# Patient Record
Sex: Female | Born: 1935
Health system: Southern US, Community
[De-identification: ages and names within clinical notes are randomized; demographics above are authoritative.]

## PROBLEM LIST (undated history)

## (undated) DIAGNOSIS — E7800 Pure hypercholesterolemia, unspecified: Secondary | ICD-10-CM

## (undated) DIAGNOSIS — J45909 Unspecified asthma, uncomplicated: Secondary | ICD-10-CM

## (undated) DIAGNOSIS — R002 Palpitations: Secondary | ICD-10-CM

## (undated) DIAGNOSIS — I10 Essential (primary) hypertension: Secondary | ICD-10-CM

## (undated) DIAGNOSIS — M353 Polymyalgia rheumatica: Secondary | ICD-10-CM

## (undated) DIAGNOSIS — R6520 Severe sepsis without septic shock: Secondary | ICD-10-CM

## (undated) DIAGNOSIS — A419 Sepsis, unspecified organism: Secondary | ICD-10-CM

## (undated) DIAGNOSIS — I5030 Unspecified diastolic (congestive) heart failure: Secondary | ICD-10-CM

## (undated) DIAGNOSIS — M8580 Other specified disorders of bone density and structure, unspecified site: Secondary | ICD-10-CM

## (undated) DIAGNOSIS — I509 Heart failure, unspecified: Secondary | ICD-10-CM

## (undated) DIAGNOSIS — I351 Nonrheumatic aortic (valve) insufficiency: Secondary | ICD-10-CM

## (undated) DIAGNOSIS — I219 Acute myocardial infarction, unspecified: Secondary | ICD-10-CM

## (undated) DIAGNOSIS — E119 Type 2 diabetes mellitus without complications: Secondary | ICD-10-CM

## (undated) DIAGNOSIS — I2510 Atherosclerotic heart disease of native coronary artery without angina pectoris: Secondary | ICD-10-CM

## (undated) HISTORY — DX: Heart failure, unspecified: I50.9

## (undated) HISTORY — DX: Atherosclerotic heart disease of native coronary artery without angina pectoris: I25.10

## (undated) HISTORY — DX: Essential (primary) hypertension: I10

## (undated) HISTORY — DX: Pure hypercholesterolemia, unspecified: E78.00

## (undated) HISTORY — PX: BREAST EXCISIONAL BIOPSY: SUR124

## (undated) HISTORY — PX: CATARACT EXTRACTION: SUR2

## (undated) HISTORY — DX: Other specified disorders of bone density and structure, unspecified site: M85.80

## (undated) HISTORY — DX: Unspecified asthma, uncomplicated: J45.909

## (undated) HISTORY — DX: Type 2 diabetes mellitus without complications: E11.9

## (undated) HISTORY — DX: Nonrheumatic aortic (valve) insufficiency: I35.1

## (undated) HISTORY — DX: Unspecified diastolic (congestive) heart failure: I50.30

---

## 1979-11-01 HISTORY — PX: ABDOMINAL HYSTERECTOMY: SHX81

## 1993-04-30 HISTORY — PX: UMBILICAL HERNIA REPAIR: SHX196

## 2005-06-22 ENCOUNTER — Ambulatory Visit: Payer: Self-pay | Admitting: Internal Medicine

## 2006-07-18 ENCOUNTER — Ambulatory Visit: Payer: Self-pay | Admitting: Internal Medicine

## 2007-05-16 ENCOUNTER — Ambulatory Visit: Payer: Self-pay | Admitting: Internal Medicine

## 2007-07-24 ENCOUNTER — Ambulatory Visit: Payer: Self-pay | Admitting: Internal Medicine

## 2008-07-25 ENCOUNTER — Ambulatory Visit: Payer: Self-pay | Admitting: Internal Medicine

## 2008-12-04 ENCOUNTER — Ambulatory Visit: Payer: Self-pay | Admitting: Gastroenterology

## 2008-12-05 ENCOUNTER — Ambulatory Visit: Payer: Self-pay | Admitting: Gastroenterology

## 2008-12-22 ENCOUNTER — Ambulatory Visit: Payer: Self-pay | Admitting: Gastroenterology

## 2009-03-19 ENCOUNTER — Ambulatory Visit: Payer: Self-pay | Admitting: Cardiology

## 2009-03-19 ENCOUNTER — Ambulatory Visit: Payer: Self-pay | Admitting: General Surgery

## 2009-03-26 ENCOUNTER — Ambulatory Visit: Payer: Self-pay | Admitting: General Surgery

## 2009-08-14 ENCOUNTER — Ambulatory Visit: Payer: Self-pay | Admitting: Internal Medicine

## 2010-02-11 ENCOUNTER — Ambulatory Visit: Payer: Self-pay | Admitting: Unknown Physician Specialty

## 2010-08-16 ENCOUNTER — Ambulatory Visit: Payer: Self-pay | Admitting: Internal Medicine

## 2011-09-26 ENCOUNTER — Ambulatory Visit: Payer: Self-pay | Admitting: Internal Medicine

## 2012-09-14 ENCOUNTER — Ambulatory Visit (INDEPENDENT_AMBULATORY_CARE_PROVIDER_SITE_OTHER): Payer: 59 | Admitting: Internal Medicine

## 2012-09-14 ENCOUNTER — Encounter: Payer: Self-pay | Admitting: Internal Medicine

## 2012-09-14 VITALS — BP 145/84 | HR 86 | Temp 97.7°F | Ht 60.5 in | Wt 139.5 lb

## 2012-09-14 DIAGNOSIS — E119 Type 2 diabetes mellitus without complications: Secondary | ICD-10-CM | POA: Insufficient documentation

## 2012-09-14 DIAGNOSIS — E7800 Pure hypercholesterolemia, unspecified: Secondary | ICD-10-CM | POA: Insufficient documentation

## 2012-09-14 DIAGNOSIS — J45909 Unspecified asthma, uncomplicated: Secondary | ICD-10-CM

## 2012-09-14 DIAGNOSIS — R5383 Other fatigue: Secondary | ICD-10-CM

## 2012-09-14 DIAGNOSIS — Z139 Encounter for screening, unspecified: Secondary | ICD-10-CM

## 2012-09-14 DIAGNOSIS — I10 Essential (primary) hypertension: Secondary | ICD-10-CM

## 2012-09-14 DIAGNOSIS — E1159 Type 2 diabetes mellitus with other circulatory complications: Secondary | ICD-10-CM | POA: Insufficient documentation

## 2012-09-14 NOTE — Patient Instructions (Addendum)
It was nice seeing you today.  I am glad you have been doing well.  Let me know if you have any problems.  

## 2012-09-15 ENCOUNTER — Encounter: Payer: Self-pay | Admitting: Internal Medicine

## 2012-09-15 DIAGNOSIS — J45909 Unspecified asthma, uncomplicated: Secondary | ICD-10-CM | POA: Insufficient documentation

## 2012-09-15 NOTE — Progress Notes (Signed)
  Subjective:    [name], female    DOB: August 15, 1933, 78 y.o.   MRN: [id number]  HPI 78 year old female with past history of hypertension, diabetes, hypercholesterolemia and reactive airways disease who comes in today to follow up on these issues as well as for a complete physical exam.  She states she is doing well.  Breathing stable.  Sees Dr Meredeth Ide.  No change in her PFTs last visit.  Watching her diet.  Sugars averaging 115-120.  Overall she feels she is doing well.   Past Medical History  Diagnosis Date  . Reactive airway disease   . Hypertension   . Hypercholesterolemia   . Osteopenia   . Diabetes mellitus     Review of Systems Patient denies any headache, lightheadedness or dizziness. No significant sinus or allergy symptoms.   No chest pain, tightness or palpitations.  No increased shortness of breath, cough or congestion.  No nausea or vomiting.  No abdominal pain or cramping.  No bowel change, such as diarrhea, constipation, BRBPR or melana.  No urine change.        Objective:   Physical Exam Filed Vitals:   09/14/12 1028  BP: 145/84  Pulse: 86  Temp: 97.7 F (36.5 C)   Blood pressure recheck:  57/35  78 year old female in no acute distress.   HEENT:  Nares- clear.  Oropharynx - without lesions. NECK:  Supple.  Nontender.  No audible bruit.  HEART:  Appears to be regular. LUNGS:  No crackles or wheezing audible.  Respirations even and unlabored.  RADIAL PULSE:  Equal bilaterally.    BREASTS:  No nipple discharge or nipple retraction present.  Could not appreciate any distinct nodules or axillary adenopathy.  ABDOMEN:  Soft, nontender.  Bowel sounds present and normal.  No audible abdominal bruit.  GU:  Normal external genitalia.  Vaginal vault without lesions.  S/P hysterectomy.  Could not appreciate any adnexal masses or tenderness.   RECTAL:  Heme negative.   EXTREMITIES:  No increased edema present.  DP pulses palpable and equal bilaterally.            Assessment & Plan:  HEALTH MAINTENANCE.  Physical today.  Is s/p hysterectomy and does not require yearly pap smears.  Colonoscopy 2/10 revealed diverticulosis with no polyps.  Mammogram 09/26/11 - BiRADS II.  Schedule a follow up mammogram.  Bone density normal - 4/09.  Will notify me when agreeable for a follow up bone density.

## 2012-09-15 NOTE — Assessment & Plan Note (Signed)
Breathing stable.  Sees Dr Fleming.  Follow.   

## 2012-09-15 NOTE — Assessment & Plan Note (Signed)
Blood pressure as outlined.  Same meds.  Follow.  Check metabolic panel.    

## 2012-09-15 NOTE — Assessment & Plan Note (Signed)
Sugars as outlined.  Watching her diet.  Check met b and a1c with next labs.

## 2012-09-15 NOTE — Assessment & Plan Note (Signed)
Low cholesterol diet and exercise.  Remains on Simvastatin.  Check lipid panel and liver function.

## 2012-09-17 ENCOUNTER — Other Ambulatory Visit (INDEPENDENT_AMBULATORY_CARE_PROVIDER_SITE_OTHER): Payer: 59

## 2012-09-17 DIAGNOSIS — I10 Essential (primary) hypertension: Secondary | ICD-10-CM

## 2012-09-17 DIAGNOSIS — E7800 Pure hypercholesterolemia, unspecified: Secondary | ICD-10-CM

## 2012-09-17 DIAGNOSIS — R5381 Other malaise: Secondary | ICD-10-CM

## 2012-09-17 DIAGNOSIS — R5383 Other fatigue: Secondary | ICD-10-CM

## 2012-09-17 DIAGNOSIS — E119 Type 2 diabetes mellitus without complications: Secondary | ICD-10-CM

## 2012-09-17 LAB — LIPID PANEL
HDL: 43.9 mg/dL (ref >39.00)
Total CHOL/HDL Ratio: 4
VLDL: 30.2 mg/dL (ref 0.0–40.0)

## 2012-09-17 LAB — CBC WITH DIFFERENTIAL/PLATELET
Basophils Absolute: 0.1 10*3/uL (ref 0.0–0.1)
Eosinophils Absolute: 0.5 10*3/uL (ref 0.0–0.7)
HCT: 39.2 % (ref 36.0–46.0)
Hemoglobin: 12.9 g/dL (ref 12.0–15.0)
Lymphs Abs: 2.2 10*3/uL (ref 0.7–4.0)
MCHC: 32.8 g/dL (ref 30.0–36.0)
Neutro Abs: 5.2 10*3/uL (ref 1.4–7.7)
RDW: 13.5 % (ref 11.5–14.6)

## 2012-09-17 LAB — BASIC METABOLIC PANEL
Calcium: 9.8 mg/dL (ref 8.4–10.5)
GFR: 70.8 mL/min (ref >60.00)
Sodium: 140 mEq/L (ref 135–145)

## 2012-09-17 LAB — HEPATIC FUNCTION PANEL
Albumin: 4.3 g/dL (ref 3.5–5.2)
Alkaline Phosphatase: 58 U/L (ref 39–117)
Bilirubin, Direct: 0.1 mg/dL (ref 0.0–0.3)

## 2012-09-17 LAB — HEMOGLOBIN A1C: Hgb A1c MFr Bld: 6.4 % (ref 4.6–6.5)

## 2012-09-21 ENCOUNTER — Other Ambulatory Visit: Payer: 59

## 2012-09-26 ENCOUNTER — Telehealth: Payer: Self-pay | Admitting: Internal Medicine

## 2012-09-26 MED ORDER — SIMVASTATIN 10 MG PO TABS: 10.0000 mg | ORAL_TABLET | Freq: Every day | ORAL | Status: DC

## 2012-09-26 NOTE — Telephone Encounter (Signed)
Refill request for simvastatin 10 mg tab Qty: 30 Sig: take one tablet by mouth every day Patient has been seen

## 2012-09-26 NOTE — Telephone Encounter (Signed)
Refilled script for Zocor

## 2012-10-31 ENCOUNTER — Telehealth: Payer: Self-pay | Admitting: Internal Medicine

## 2012-10-31 NOTE — Telephone Encounter (Signed)
Called in flonase refill (one month with 5 refills).

## 2012-11-02 ENCOUNTER — Ambulatory Visit: Payer: Self-pay | Admitting: Internal Medicine

## 2012-11-06 ENCOUNTER — Ambulatory Visit: Payer: Self-pay | Admitting: Internal Medicine

## 2012-11-12 ENCOUNTER — Other Ambulatory Visit: Payer: Self-pay | Admitting: Internal Medicine

## 2012-11-12 MED ORDER — FLUTICASONE-SALMETEROL 250-50 MCG/DOSE IN AEPB: 1.0000 | INHALATION_SPRAY | Freq: Two times a day (BID) | RESPIRATORY_TRACT | Status: DC

## 2012-11-12 NOTE — Telephone Encounter (Signed)
Sent in to pharmacy.  

## 2012-11-12 NOTE — Telephone Encounter (Signed)
Refill on Advair 250/50 -[address]

## 2012-11-19 ENCOUNTER — Encounter: Payer: Self-pay | Admitting: Internal Medicine

## 2012-11-22 ENCOUNTER — Telehealth: Payer: Self-pay | Admitting: Internal Medicine

## 2012-11-22 NOTE — Telephone Encounter (Signed)
metFORMIN (GLUCOPHAGE) 500 MG tablet  #60

## 2012-11-23 ENCOUNTER — Other Ambulatory Visit: Payer: Self-pay | Admitting: *Deleted

## 2012-11-23 MED ORDER — METFORMIN HCL 500 MG PO TABS: 500.0000 mg | ORAL_TABLET | Freq: Two times a day (BID) | ORAL | Status: DC

## 2012-11-23 NOTE — Telephone Encounter (Signed)
Patient only has 3 pills left.

## 2012-11-23 NOTE — Telephone Encounter (Signed)
Already been filled

## 2012-11-23 NOTE — Addendum Note (Signed)
Addended by: Charm Barges on: 11/23/2012 05:21 PM   Modules accepted: Orders

## 2012-12-25 ENCOUNTER — Telehealth: Payer: Self-pay | Admitting: Internal Medicine

## 2012-12-25 MED ORDER — LISINOPRIL-HYDROCHLOROTHIAZIDE 20-12.5 MG PO TABS: 1.0000 | ORAL_TABLET | Freq: Every day | ORAL | Status: DC

## 2012-12-25 NOTE — Telephone Encounter (Signed)
lisinopril-hydrochlorothiazide (PRINZIDE,ZESTORETIC) 20-12.5 MG per tablet   #90

## 2013-01-11 ENCOUNTER — Encounter: Payer: Self-pay | Admitting: Internal Medicine

## 2013-01-11 ENCOUNTER — Ambulatory Visit (INDEPENDENT_AMBULATORY_CARE_PROVIDER_SITE_OTHER): Payer: Medicare PPO | Admitting: Internal Medicine

## 2013-01-11 VITALS — BP 120/80 | HR 80 | Temp 98.5°F | Ht 60.5 in | Wt 139.5 lb

## 2013-01-11 DIAGNOSIS — J45909 Unspecified asthma, uncomplicated: Secondary | ICD-10-CM

## 2013-01-11 LAB — HEMOGLOBIN A1C: Hgb A1c MFr Bld: 6.5 % (ref 4.6–6.5)

## 2013-01-11 LAB — BASIC METABOLIC PANEL
CO2: 25 mEq/L (ref 19–32)
Calcium: 9.7 mg/dL (ref 8.4–10.5)
Chloride: 103 mEq/L (ref 96–112)
Creatinine, Ser: 0.9 mg/dL (ref 0.4–1.2)
Glucose, Bld: 147 mg/dL — ABNORMAL HIGH (ref 70–99)

## 2013-01-11 LAB — HEPATIC FUNCTION PANEL
ALT: 17 U/L (ref 0–35)
AST: 21 U/L (ref 0–37)
Bilirubin, Direct: 0.1 mg/dL (ref 0.0–0.3)
Total Bilirubin: 0.8 mg/dL (ref 0.3–1.2)

## 2013-01-11 LAB — LIPID PANEL
Total CHOL/HDL Ratio: 4
Triglycerides: 251 mg/dL — ABNORMAL HIGH (ref 0.0–149.0)

## 2013-01-11 LAB — LDL CHOLESTEROL, DIRECT: Direct LDL: 92.5 mg/dL

## 2013-01-13 ENCOUNTER — Encounter: Payer: Self-pay | Admitting: Internal Medicine

## 2013-01-13 NOTE — Assessment & Plan Note (Signed)
Sugars she reports are doing well.   Watching her diet.  Check met b and a1c with next labs.  Last a1c 09/17/12 - 6.4.

## 2013-01-13 NOTE — Assessment & Plan Note (Signed)
Blood pressure as outlined.  Same meds.  Follow.  Check metabolic panel.

## 2013-01-13 NOTE — Assessment & Plan Note (Signed)
Low cholesterol diet and exercise.  Remains on Simvastatin.  Check lipid panel and liver function.  Last lipid panel 09/17/12 revealed total cholesterol 165, triglycerides 151, HDL 44 and LDL 91.

## 2013-01-13 NOTE — Assessment & Plan Note (Signed)
Breathing stable.  Sees Dr Fleming.  Follow.   

## 2013-01-13 NOTE — Progress Notes (Signed)
  Subjective:    [name], female    DOB: 02-03-1933, 79 y.o.   MRN: [id number]  HPI 79 year old female with past history of hypertension, diabetes, hypercholesterolemia and reactive airways disease who comes in today for a scheduled follow up.  She states she is doing well.  Breathing stable.  Sees Dr Meredeth Ide.  No changes made.  She has noticed some burning in her nose.  Discussed using saline and her Flonase.  Watching her diet.  Sugars doing well per her report.  Brought in no recorded sugar readings.  Overall she feels she is doing well.   Past Medical History  Diagnosis Date  . Reactive airway disease   . Hypertension   . Hypercholesterolemia   . Osteopenia   . Diabetes mellitus      Current Outpatient Prescriptions on File Prior to Visit  Medication Sig Dispense Refill  . aspirin 81 MG tablet Take 81 mg by mouth daily.      . calcium carbonate (OS-CAL) 600 MG TABS Take 600 mg by mouth daily.      . fluticasone (FLONASE) 50 MCG/ACT nasal spray Place 2 sprays into the nose daily.      . Fluticasone-Salmeterol (ADVAIR) 250-50 MCG/DOSE AEPB Inhale 1 puff into the lungs every 12 (twelve) hours.  60 each  5  . lisinopril-hydrochlorothiazide (PRINZIDE,ZESTORETIC) 20-12.5 MG per tablet Take 1 tablet by mouth daily.  90 tablet  1  . metFORMIN (GLUCOPHAGE) 500 MG tablet Take 1 tablet (500 mg total) by mouth 2 (two) times daily with a meal.  60 tablet  5  . Multiple Vitamins-Minerals (VISION FORMULA/LUTEIN PO) Take by mouth daily.      . simvastatin (ZOCOR) 10 MG tablet Take 1 tablet (10 mg total) by mouth at bedtime.  90 tablet  3   No current facility-administered medications on file prior to visit.    Review of Systems Patient denies any headache, lightheadedness or dizziness. No significant sinus or allergy symptoms.  Some burning in her nose.  See above.   No chest pain, tightness or palpitations.  No increased shortness of breath, cough or congestion.  No nausea or  vomiting. No acid reflux.   No abdominal pain or cramping.  No bowel change, such as diarrhea, constipation, BRBPR or melana.  No urine change.        Objective:   Physical Exam  Filed Vitals:   01/11/13 1018  BP: 120/80  Pulse: 80  Temp: 98.5 F (36.9 C)   Blood pressure recheck:  45/58  79 year old female in no acute distress.   HEENT:  Nares- clear.  Oropharynx - without lesions. NECK:  Supple.  Nontender.  No audible bruit.  HEART:  Appears to be regular. LUNGS:  No crackles or wheezing audible.  Respirations even and unlabored.  RADIAL PULSE:  Equal bilaterally. ABDOMEN:  Soft, nontender.  Bowel sounds present and normal.  No audible abdominal bruit.   EXTREMITIES:  No increased edema present.  DP pulses palpable and equal bilaterally.           Assessment & Plan:  HEALTH MAINTENANCE.  Physical 09/14/12.  Is s/p hysterectomy and does not require yearly pap smears.  Colonoscopy 2/10 revealed diverticulosis with no polyps.  Mammogram 09/26/11 - BiRADS II.  States she had a follow up mammogram.  Obtain results.   Bone density normal - 4/09.  Will notify me when agreeable for a follow up bone density.

## 2013-02-17 ENCOUNTER — Emergency Department: Payer: Self-pay | Admitting: Emergency Medicine

## 2013-02-17 LAB — COMPREHENSIVE METABOLIC PANEL
Albumin: 4.1 g/dL (ref 3.4–5.0)
Alkaline Phosphatase: 84 U/L (ref 50–136)
Anion Gap: 8 (ref 7–16)
BUN: 25 mg/dL — ABNORMAL HIGH (ref 7–18)
Bilirubin,Total: 0.5 mg/dL (ref 0.2–1.0)
Calcium, Total: 9.8 mg/dL (ref 8.5–10.1)
EGFR (Non-African Amer.): 49 — ABNORMAL LOW
Potassium: 4 mmol/L (ref 3.5–5.1)
SGPT (ALT): 19 U/L (ref 12–78)
Sodium: 141 mmol/L (ref 136–145)
Total Protein: 7.9 g/dL (ref 6.4–8.2)

## 2013-02-17 LAB — URINALYSIS, COMPLETE
Bacteria: NONE SEEN
Blood: NEGATIVE
Glucose,UR: NEGATIVE mg/dL (ref 0–75)
Ketone: NEGATIVE
Ph: 5 (ref 4.5–8.0)
Protein: NEGATIVE
RBC,UR: 3 /HPF (ref 0–5)
Specific Gravity: 1.019 (ref 1.003–1.030)
WBC UR: 24 /HPF (ref 0–5)

## 2013-02-17 LAB — CBC
MCHC: 33.2 g/dL (ref 32.0–36.0)
MCV: 88 fL (ref 80–100)

## 2013-02-18 ENCOUNTER — Emergency Department: Payer: Self-pay | Admitting: Emergency Medicine

## 2013-02-18 LAB — COMPREHENSIVE METABOLIC PANEL
Albumin: 3.6 g/dL (ref 3.4–5.0)
Alkaline Phosphatase: 71 U/L (ref 50–136)
BUN: 26 mg/dL — ABNORMAL HIGH (ref 7–18)
Bilirubin,Total: 0.5 mg/dL (ref 0.2–1.0)
Calcium, Total: 9.5 mg/dL (ref 8.5–10.1)
Co2: 27 mmol/L (ref 21–32)
EGFR (African American): >60
EGFR (Non-African Amer.): 52 — ABNORMAL LOW
Glucose: 186 mg/dL — ABNORMAL HIGH (ref 65–99)
SGOT(AST): 13 U/L — ABNORMAL LOW (ref 15–37)
SGPT (ALT): 15 U/L (ref 12–78)
Sodium: 135 mmol/L — ABNORMAL LOW (ref 136–145)
Total Protein: 7.5 g/dL (ref 6.4–8.2)

## 2013-02-18 LAB — URINALYSIS, COMPLETE
Bilirubin,UR: NEGATIVE
Glucose,UR: NEGATIVE mg/dL (ref 0–75)
Nitrite: NEGATIVE
Ph: 5 (ref 4.5–8.0)
RBC,UR: 2 /HPF (ref 0–5)
Specific Gravity: 1.024 (ref 1.003–1.030)
Squamous Epithelial: 1

## 2013-02-18 LAB — CBC
HCT: 35.7 % (ref 35.0–47.0)
HGB: 11.8 g/dL — ABNORMAL LOW (ref 12.0–16.0)
MCHC: 33.1 g/dL (ref 32.0–36.0)
MCV: 88 fL (ref 80–100)
Platelet: 242 10*3/uL (ref 150–440)
RBC: 4.08 10*6/uL (ref 3.80–5.20)
RDW: 13.3 % (ref 11.5–14.5)
WBC: 14.2 10*3/uL — ABNORMAL HIGH (ref 3.6–11.0)

## 2013-02-18 LAB — CK TOTAL AND CKMB (NOT AT ARMC)
CK, Total: 59 U/L (ref 21–215)
CK-MB: 0.7 ng/mL (ref 0.5–3.6)

## 2013-02-18 LAB — TROPONIN I: Troponin-I: <0.02 ng/mL

## 2013-02-18 LAB — LIPASE, BLOOD: Lipase: 88 U/L (ref 73–393)

## 2013-02-19 ENCOUNTER — Telehealth: Payer: Self-pay | Admitting: Internal Medicine

## 2013-02-19 NOTE — Telephone Encounter (Signed)
I can see her Friday 02/22/13 at 11:30 for ER follow up.   Need ER records.

## 2013-02-19 NOTE — Telephone Encounter (Signed)
Please Advise

## 2013-02-19 NOTE — Telephone Encounter (Signed)
Patient was seen in the ER Sunday at [facility] for Diverticulitis and mild bladder infection . Do you want this patient to follow up with you this week or as soon as an appointment is available. The patient is feeling much better.

## 2013-02-20 NOTE — Telephone Encounter (Signed)
Pt called regarding hospital f/u appt.  Advised pt 4/25 @ 11:30 a.m. W/ Dr. Lorin Picket.

## 2013-02-22 ENCOUNTER — Ambulatory Visit (INDEPENDENT_AMBULATORY_CARE_PROVIDER_SITE_OTHER): Payer: Medicare PPO | Admitting: Internal Medicine

## 2013-02-22 ENCOUNTER — Ambulatory Visit: Payer: Medicare PPO | Admitting: Internal Medicine

## 2013-02-22 ENCOUNTER — Encounter: Payer: Self-pay | Admitting: Internal Medicine

## 2013-02-22 VITALS — BP 120/60 | HR 79 | Temp 98.6°F | Ht 60.5 in | Wt 138.0 lb

## 2013-02-22 DIAGNOSIS — E119 Type 2 diabetes mellitus without complications: Secondary | ICD-10-CM

## 2013-02-22 DIAGNOSIS — K5732 Diverticulitis of large intestine without perforation or abscess without bleeding: Secondary | ICD-10-CM

## 2013-02-22 DIAGNOSIS — J45909 Unspecified asthma, uncomplicated: Secondary | ICD-10-CM

## 2013-02-22 DIAGNOSIS — I10 Essential (primary) hypertension: Secondary | ICD-10-CM

## 2013-02-22 DIAGNOSIS — K5792 Diverticulitis of intestine, part unspecified, without perforation or abscess without bleeding: Secondary | ICD-10-CM

## 2013-02-22 DIAGNOSIS — D72829 Elevated white blood cell count, unspecified: Secondary | ICD-10-CM

## 2013-02-22 LAB — CBC WITH DIFFERENTIAL/PLATELET
Basophils Relative: 0.7 % (ref 0.0–3.0)
Eosinophils Absolute: 0.4 10*3/uL (ref 0.0–0.7)
Lymphocytes Relative: 15.6 % (ref 12.0–46.0)
Lymphs Abs: 2.1 10*3/uL (ref 0.7–4.0)
Monocytes Absolute: 0.8 10*3/uL (ref 0.1–1.0)
Neutro Abs: 10 10*3/uL — ABNORMAL HIGH (ref 1.4–7.7)
Neutrophils Relative %: 74.7 % (ref 43.0–77.0)
Platelets: 310 10*3/uL (ref 150.0–400.0)
RDW: 13.1 % (ref 11.5–14.6)
WBC: 13.4 10*3/uL — ABNORMAL HIGH (ref 4.5–10.5)

## 2013-02-22 NOTE — Patient Instructions (Signed)
I would start taking align - one per day

## 2013-02-24 ENCOUNTER — Encounter: Payer: Self-pay | Admitting: Internal Medicine

## 2013-02-24 DIAGNOSIS — K5792 Diverticulitis of intestine, part unspecified, without perforation or abscess without bleeding: Secondary | ICD-10-CM | POA: Insufficient documentation

## 2013-02-24 NOTE — Progress Notes (Signed)
Subjective:    [name], female    DOB: 09-13-1933, 79 y.o.   MRN: [id number]  HPI 79 year old female with past history of hypertension, diabetes, hypercholesterolemia and reactive airways disease who comes in today as a work in for an ER follow up.  She states she is doing better now.  States that approximately one week ago, she ate popcorn.  The next day noticed some soreness in her left lower quadrant.  Intermittent.  Began to worsen over the next few days.  developed loose stool.  Pain with bowel movement.  Went to ER.  Was diagnosed with diverticulitis and uti.   was placed on cipro and flagyl.  Taking.  The following day, went back to the ER - secondary to nausea and vomiting.  They gave her something for nausea.  Since then, she has been taking her antibiotics.  Pain is better.  Nausea has resolved.  The pain has improved.  Still with some loose stool, but overal feels better.  She is eating and drinking.   Breathing stable.  Sees Dr Meredeth Ide.   Past Medical History  Diagnosis Date  . Reactive airway disease   . Hypertension   . Hypercholesterolemia   . Osteopenia   . Diabetes mellitus     Current Outpatient Prescriptions on File Prior to Visit  Medication Sig Dispense Refill  . aspirin 81 MG tablet Take 81 mg by mouth daily.      . calcium carbonate (OS-CAL) 600 MG TABS Take 600 mg by mouth daily.      . fluticasone (FLONASE) 50 MCG/ACT nasal spray Place 2 sprays into the nose daily.      . Fluticasone-Salmeterol (ADVAIR) 250-50 MCG/DOSE AEPB Inhale 1 puff into the lungs every 12 (twelve) hours.  60 each  5  . lisinopril-hydrochlorothiazide (PRINZIDE,ZESTORETIC) 20-12.5 MG per tablet Take 1 tablet by mouth daily.  90 tablet  1  . metFORMIN (GLUCOPHAGE) 500 MG tablet Take 1 tablet (500 mg total) by mouth 2 (two) times daily with a meal.  60 tablet  5  . Multiple Vitamins-Minerals (VISION FORMULA/LUTEIN PO) Take by mouth daily.      . simvastatin (ZOCOR) 10 MG tablet  Take 1 tablet (10 mg total) by mouth at bedtime.  90 tablet  3   No current facility-administered medications on file prior to visit.    Review of Systems Patient denies any headache, lightheadedness or dizziness. No significant sinus or allergy symptoms.  No chest pain, tightness or palpitations.  No increased shortness of breath, cough or congestion.  No nausea or vomiting currently.  No acid reflux.   Abdominal pain and cramping have improved.  Still with some minimal discomfort in her LLQ.   No constipation, BRBPR or melana.  Does still have some loose stool.  No urine change.        Objective:   Physical Exam  Filed Vitals:   02/22/13 1055  BP: 120/60  Pulse: 79  Temp: 98.6 F (37 C)   Blood pressure recheck:  23/23  79 year old female in no acute distress.   HEENT:  Nares- clear.  Oropharynx - without lesions. NECK:  Supple.  Nontender.  No audible bruit.  HEART:  Appears to be regular. LUNGS:  No crackles or wheezing audible.  Respirations even and unlabored.  RADIAL PULSE:  Equal bilaterally. ABDOMEN:  Soft.  Minimal tenderness to palpation over the LLQ.  No rebound or guarding.  Tanya Cole reports much improved.  Bowel sounds present and normal.  No audible abdominal bruit.   EXTREMITIES:  No increased edema present.  DP pulses palpable and equal bilaterally.           Assessment & Plan:  HEALTH MAINTENANCE.  Physical 09/14/12.  Is s/p hysterectomy and does not require yearly pap smears.  Colonoscopy 2/10 revealed diverticulosis with no polyps.  Mammogram 09/26/11 - BiRADS II.  Mammogram 11/02/12 - recommended follow up views.  11/06/12 - follow up mammo - Birads II. Bone density normal - 4/09.  Is to notify me when agreeable for a follow up bone density.

## 2013-02-24 NOTE — Assessment & Plan Note (Signed)
Symptoms have improved.  Complete cipro and flagyl.  She is tolerating.  Follow.  Recheck cbc given leukocytosis.

## 2013-02-24 NOTE — Assessment & Plan Note (Signed)
Breathing stable.  Sees Dr Fleming.  Follow.   

## 2013-02-24 NOTE — Assessment & Plan Note (Signed)
Sugars she reports are doing well.  Follow.

## 2013-02-24 NOTE — Assessment & Plan Note (Signed)
Blood pressure as outlined.  Same meds.  Follow.   

## 2013-02-28 ENCOUNTER — Telehealth: Payer: Self-pay | Admitting: *Deleted

## 2013-02-28 DIAGNOSIS — D72829 Elevated white blood cell count, unspecified: Secondary | ICD-10-CM

## 2013-02-28 NOTE — Telephone Encounter (Signed)
Pt is coming in tomorrow 05.02.2014 for labs, what labs and dx would you like?  Thank you

## 2013-02-28 NOTE — Telephone Encounter (Signed)
I ordered f/u cbc

## 2013-03-01 ENCOUNTER — Other Ambulatory Visit (INDEPENDENT_AMBULATORY_CARE_PROVIDER_SITE_OTHER): Payer: Medicare PPO

## 2013-03-01 DIAGNOSIS — D72829 Elevated white blood cell count, unspecified: Secondary | ICD-10-CM

## 2013-03-01 LAB — CBC WITH DIFFERENTIAL/PLATELET
Basophils Absolute: 0.1 10*3/uL (ref 0.0–0.1)
Eosinophils Absolute: 0.5 10*3/uL (ref 0.0–0.7)
Hemoglobin: 13 g/dL (ref 12.0–15.0)
Lymphocytes Relative: 22.1 % (ref 12.0–46.0)
MCHC: 34.2 g/dL (ref 30.0–36.0)
Neutro Abs: 7.7 10*3/uL (ref 1.4–7.7)
Neutrophils Relative %: 67.8 % (ref 43.0–77.0)
RDW: 13.5 % (ref 11.5–14.6)

## 2013-05-13 ENCOUNTER — Encounter: Payer: Self-pay | Admitting: Internal Medicine

## 2013-05-16 ENCOUNTER — Ambulatory Visit: Payer: Medicare PPO | Admitting: Internal Medicine

## 2013-05-21 ENCOUNTER — Other Ambulatory Visit: Payer: Self-pay | Admitting: Internal Medicine

## 2013-05-24 ENCOUNTER — Ambulatory Visit: Payer: Medicare PPO | Admitting: Internal Medicine

## 2013-06-18 ENCOUNTER — Encounter: Payer: Self-pay | Admitting: Internal Medicine

## 2013-06-18 ENCOUNTER — Ambulatory Visit (INDEPENDENT_AMBULATORY_CARE_PROVIDER_SITE_OTHER): Payer: Medicare PPO | Admitting: Internal Medicine

## 2013-06-18 VITALS — BP 102/70 | HR 83 | Temp 98.8°F | Ht 60.5 in | Wt 140.0 lb

## 2013-06-18 DIAGNOSIS — E7800 Pure hypercholesterolemia, unspecified: Secondary | ICD-10-CM

## 2013-06-18 DIAGNOSIS — I10 Essential (primary) hypertension: Secondary | ICD-10-CM

## 2013-06-18 DIAGNOSIS — J45909 Unspecified asthma, uncomplicated: Secondary | ICD-10-CM

## 2013-06-18 DIAGNOSIS — D72829 Elevated white blood cell count, unspecified: Secondary | ICD-10-CM

## 2013-06-18 DIAGNOSIS — K5732 Diverticulitis of large intestine without perforation or abscess without bleeding: Secondary | ICD-10-CM

## 2013-06-18 DIAGNOSIS — K5792 Diverticulitis of intestine, part unspecified, without perforation or abscess without bleeding: Secondary | ICD-10-CM

## 2013-06-18 DIAGNOSIS — E119 Type 2 diabetes mellitus without complications: Secondary | ICD-10-CM

## 2013-06-18 LAB — CBC WITH DIFFERENTIAL/PLATELET
Basophils Absolute: 0 10*3/uL (ref 0.0–0.1)
HCT: 39.5 % (ref 36.0–46.0)
Lymphocytes Relative: 23.7 % (ref 12.0–46.0)
Lymphs Abs: 2.2 10*3/uL (ref 0.7–4.0)
Monocytes Relative: 6 % (ref 3.0–12.0)
Neutrophils Relative %: 63.4 % (ref 43.0–77.0)
Platelets: 258 10*3/uL (ref 150.0–400.0)
RDW: 14.2 % (ref 11.5–14.6)

## 2013-06-18 LAB — BASIC METABOLIC PANEL
CO2: 26 mEq/L (ref 19–32)
Calcium: 10 mg/dL (ref 8.4–10.5)
Potassium: 4.7 mEq/L (ref 3.5–5.1)
Sodium: 138 mEq/L (ref 135–145)

## 2013-06-18 LAB — HEPATIC FUNCTION PANEL
ALT: 17 U/L (ref 0–35)
Total Bilirubin: 0.7 mg/dL (ref 0.3–1.2)
Total Protein: 7.5 g/dL (ref 6.0–8.3)

## 2013-06-18 LAB — LIPID PANEL
HDL: 44.2 mg/dL (ref >39.00)
Triglycerides: 255 mg/dL — ABNORMAL HIGH (ref 0.0–149.0)
VLDL: 51 mg/dL — ABNORMAL HIGH (ref 0.0–40.0)

## 2013-06-18 LAB — LDL CHOLESTEROL, DIRECT: Direct LDL: 105.7 mg/dL

## 2013-06-18 LAB — MICROALBUMIN / CREATININE URINE RATIO: Microalb, Ur: 1.1 mg/dL (ref 0.0–1.9)

## 2013-06-18 LAB — HEMOGLOBIN A1C: Hgb A1c MFr Bld: 7.1 % — ABNORMAL HIGH (ref 4.6–6.5)

## 2013-06-18 MED ORDER — LISINOPRIL-HYDROCHLOROTHIAZIDE 20-12.5 MG PO TABS: 1.0000 | ORAL_TABLET | Freq: Every day | ORAL | Status: DC

## 2013-06-20 ENCOUNTER — Encounter: Payer: Self-pay | Admitting: Internal Medicine

## 2013-06-20 NOTE — Progress Notes (Signed)
  Subjective:    [name], female    DOB: 03-Jul-1933, 79 y.o.   MRN: [id number]  HPI 79 year old female with past history of hypertension, diabetes, hypercholesterolemia and reactive airways disease who comes in today for a scheduled follow up.  She states she is doing well.  Previously had sinusitis and bronchitis.  Was seen at acute care.  Treated with prednisone and antibiotics.  Better now.  Breathing stable.   She is eating and drinking well.  Keeping her grandchild.  Eating a little different than she normally does.  States her am sugars are averaging 114-128.  no recorded sugar readings brought in.  Still seeing Dr Meredeth Ide.   Past Medical History  Diagnosis Date  . Reactive airway disease   . Hypertension   . Hypercholesterolemia   . Osteopenia   . Diabetes mellitus     Current Outpatient Prescriptions on File Prior to Visit  Medication Sig Dispense Refill  . aspirin 81 MG tablet Take 81 mg by mouth daily.      . calcium carbonate (OS-CAL) 600 MG TABS Take 600 mg by mouth daily.      . fluticasone (FLONASE) 50 MCG/ACT nasal spray Place 2 sprays into the nose daily.      . Fluticasone-Salmeterol (ADVAIR) 250-50 MCG/DOSE AEPB Inhale 1 puff into the lungs every 12 (twelve) hours.  60 each  5  . metFORMIN (GLUCOPHAGE) 500 MG tablet TAKE ONE TABLET BY MOUTH TWICE DAILY WITH A MEAL  60 tablet  1  . Multiple Vitamins-Minerals (VISION FORMULA/LUTEIN PO) Take by mouth daily.      . simvastatin (ZOCOR) 10 MG tablet Take 1 tablet (10 mg total) by mouth at bedtime.  90 tablet  3   No current facility-administered medications on file prior to visit.    Review of Systems Patient denies any headache, lightheadedness or dizziness. No significant sinus or allergy symptoms.  No chest pain, tightness or palpitations.  No increased shortness of breath, cough or congestion.  Breathing stable.  No nausea or vomiting.  No acid reflux.  No abdominal pain or cramping.  No constipation,  BRBPR or melana.  No diarrhea and no further flares with her bowels.  No urine change.        Objective:   Physical Exam  Filed Vitals:   06/18/13 1030  BP: 102/70  Pulse: 83  Temp: 98.8 F (37.1 C)   Blood pressure recheck:  120/78, pulse 58  79 year old female in no acute distress.   HEENT:  Nares- clear.  Oropharynx - without lesions. NECK:  Supple.  Nontender.  No audible bruit.  HEART:  Appears to be regular. LUNGS:  No crackles or wheezing audible.  Respirations even and unlabored.  RADIAL PULSE:  Equal bilaterally. ABDOMEN:  Soft. Non tender.  Bowel sounds present and normal.  No audible abdominal bruit.   EXTREMITIES:  No increased edema present.  DP pulses palpable and equal bilaterally.  FEET:  No lesions.            Assessment & Plan:  HEALTH MAINTENANCE.  Physical 09/14/12.  Is s/p hysterectomy and does not require yearly pap smears.  Colonoscopy 2/10 revealed diverticulosis with no polyps.  Mammogram 09/26/11 - BiRADS II.  Mammogram 11/02/12 - recommended follow up views.  11/06/12 - follow up mammo - Birads II. Bone density normal - 4/09.  Is to notify me when agreeable for a follow up bone density.

## 2013-06-20 NOTE — Assessment & Plan Note (Signed)
Low cholesterol diet and exercise.  Remains on Simvastatin.  Follow lipid panel and liver function.      

## 2013-06-20 NOTE — Assessment & Plan Note (Signed)
Blood pressure as outlined.  Same meds.  Follow.   

## 2013-06-20 NOTE — Assessment & Plan Note (Signed)
Resolved

## 2013-06-20 NOTE — Assessment & Plan Note (Signed)
Breathing stable.  Sees Dr Fleming.  Follow.   

## 2013-06-20 NOTE — Assessment & Plan Note (Signed)
Sugars she reports as outlined.  Check metabolic panel and a1c.  Overdue eye check.  Sees Dr Gentry Fitz.  Plans to schedule.

## 2013-07-10 ENCOUNTER — Encounter: Payer: Self-pay | Admitting: *Deleted

## 2013-07-22 ENCOUNTER — Other Ambulatory Visit: Payer: Self-pay | Admitting: Internal Medicine

## 2013-10-02 ENCOUNTER — Other Ambulatory Visit: Payer: Self-pay | Admitting: Internal Medicine

## 2013-10-10 ENCOUNTER — Telehealth: Payer: Self-pay | Admitting: Internal Medicine

## 2013-10-10 NOTE — Telephone Encounter (Signed)
Pt received letter that her mammogram needs to be scheduled.  Would like for [place] to schedule that for her at [facility] with a morning appt.

## 2013-10-10 NOTE — Telephone Encounter (Signed)
Pt notified to contact [place] to schedule appt

## 2013-10-17 ENCOUNTER — Other Ambulatory Visit: Payer: Self-pay | Admitting: Internal Medicine

## 2013-11-05 ENCOUNTER — Ambulatory Visit: Payer: Self-pay | Admitting: Internal Medicine

## 2013-11-05 LAB — HM MAMMOGRAPHY: HM MAMMO: NEGATIVE

## 2013-11-07 ENCOUNTER — Encounter: Payer: Self-pay | Admitting: Internal Medicine

## 2013-11-15 ENCOUNTER — Telehealth: Payer: Self-pay | Admitting: Internal Medicine

## 2013-11-15 ENCOUNTER — Encounter: Payer: Self-pay | Admitting: Internal Medicine

## 2013-11-15 ENCOUNTER — Other Ambulatory Visit: Payer: Self-pay | Admitting: Internal Medicine

## 2013-11-15 ENCOUNTER — Ambulatory Visit (INDEPENDENT_AMBULATORY_CARE_PROVIDER_SITE_OTHER): Payer: Medicare PPO | Admitting: Internal Medicine

## 2013-11-15 VITALS — BP 110/70 | HR 88 | Temp 99.0°F | Ht 61.0 in | Wt 134.8 lb

## 2013-11-15 DIAGNOSIS — E119 Type 2 diabetes mellitus without complications: Secondary | ICD-10-CM

## 2013-11-15 DIAGNOSIS — I10 Essential (primary) hypertension: Secondary | ICD-10-CM

## 2013-11-15 DIAGNOSIS — E7800 Pure hypercholesterolemia, unspecified: Secondary | ICD-10-CM

## 2013-11-15 DIAGNOSIS — Z1211 Encounter for screening for malignant neoplasm of colon: Secondary | ICD-10-CM

## 2013-11-15 DIAGNOSIS — J45909 Unspecified asthma, uncomplicated: Secondary | ICD-10-CM

## 2013-11-15 NOTE — Telephone Encounter (Signed)
At checkout pt prefers not to come in 1-2 weeks for labs.  Asking if she can come at 4 mth f/u and have her labs done at that time.  States she [place] the way across town and it is not easy for her to get to appts.  No lab scheduled at this time.

## 2013-11-15 NOTE — Progress Notes (Signed)
Pre-visit discussion using our clinic review tool. No additional management support is needed unless otherwise documented below in the visit note.  

## 2013-11-15 NOTE — Patient Instructions (Signed)
Saline nasal spray - flush nose at least 2-3x/day.  Mucinex in the am and Robitussin in the evening.

## 2013-11-15 NOTE — Progress Notes (Signed)
Orders placed for f/u labs.  

## 2013-11-15 NOTE — Progress Notes (Signed)
Subjective:    [name], female    DOB: 09/18/1933, 80 y.o.   MRN: [id number]  HPI 80 year old female with past history of hypertension, diabetes, hypercholesterolemia and reactive airways disease who comes in today to follow up on these issues as well as for a complete physical exam.   Breathing stable.   She is eating and drinking well.  States her am sugars are averaging 128.  No recorded sugar readings brought in.  Still seeing Dr Meredeth Ide.  States last Sunday she had noticed some minimal congestion - nose.  Some blood tinged mucus.  Took sudafed.  Better.  No chest congestion.  No cough.  No sob or wheezing.  No nausea or vomiting.  No diarrhea.     Past Medical History  Diagnosis Date  . Reactive airway disease   . Hypertension   . Hypercholesterolemia   . Osteopenia   . Diabetes mellitus     Current Outpatient Prescriptions on File Prior to Visit  Medication Sig Dispense Refill  . aspirin 81 MG tablet Take 81 mg by mouth daily.      . calcium carbonate (OS-CAL) 600 MG TABS Take 600 mg by mouth daily.      . fluticasone (FLONASE) 50 MCG/ACT nasal spray USE TWO SPRAY IN EACH NOSTRIL EVERY DAY  16 g  5  . Fluticasone-Salmeterol (ADVAIR) 250-50 MCG/DOSE AEPB Inhale 1 puff into the lungs every 12 (twelve) hours.  60 each  5  . lisinopril-hydrochlorothiazide (PRINZIDE,ZESTORETIC) 20-12.5 MG per tablet Take 1 tablet by mouth daily.  90 tablet  1  . metFORMIN (GLUCOPHAGE) 500 MG tablet TAKE ONE TABLET BY MOUTH TWICE DAILY WITH A MEAL  60 tablet  5  . Multiple Vitamins-Minerals (VISION FORMULA/LUTEIN PO) Take by mouth daily.      . simvastatin (ZOCOR) 10 MG tablet TAKE ONE TABLET BY MOUTH AT BEDTIME  90 tablet  1   No current facility-administered medications on file prior to visit.    Review of Systems Patient denies any headache, lightheadedness or dizziness.  No significant sinus or allergy symptoms.  Some minimal nasal congestion as outlined.  No chest pain,  tightness or palpitations.  No increased shortness of breath, cough or congestion.  Breathing stable.  No nausea or vomiting.  No acid reflux.  No abdominal pain or cramping.  No constipation, BRBPR or melana.  No diarrhea and no further flares with her bowels.  No urine change.  Sugars as outlined.        Objective:   Physical Exam  Filed Vitals:   11/15/13 1037  BP: 110/70  Pulse: 88  Temp: 99 F (37.2 C)   Blood pressure recheck:  58/39  80 year old female in no acute distress.   HEENT:  Nares- clear.  Oropharynx - without lesions. NECK:  Supple.  Nontender.  No audible bruit.  HEART:  Appears to be regular. LUNGS:  No crackles or wheezing audible.  Respirations even and unlabored.  RADIAL PULSE:  Equal bilaterally.    BREASTS:  No nipple discharge or nipple retraction present.  Could not appreciate any distinct nodules or axillary adenopathy.  ABDOMEN:  Soft, nontender.  Bowel sounds present and normal.  No audible abdominal bruit.  GU:  Not performed.     EXTREMITIES:  No increased edema present.  DP pulses palpable and equal bilaterally.  SKIN:  No lesions.          Assessment & Plan:  HEALTH MAINTENANCE.  Physical today.  Is s/p hysterectomy and does not require yearly pap smears.  Colonoscopy 2/10 revealed diverticulosis with no polyps.  Mammogram 09/26/11 - BiRADS II.  Mammogram 11/02/12 - recommended follow up views.  11/06/12 - follow up mammo - Birads II.  States had f/u mammogram last week.  Need results.   Bone density normal - 4/09.  Is to notify me when agreeable for a follow up bone density.  IFOB given today.

## 2013-11-17 ENCOUNTER — Encounter: Payer: Self-pay | Admitting: Internal Medicine

## 2013-11-17 NOTE — Assessment & Plan Note (Signed)
Breathing stable.  Sees Dr Fleming.  Follow.   

## 2013-11-17 NOTE — Assessment & Plan Note (Signed)
Sugars she reports as outlined.  Check metabolic panel and a1c.  Overdue eye check.  Sees Dr Gentry Fitz.

## 2013-11-17 NOTE — Assessment & Plan Note (Signed)
Blood pressure as outlined.  Same meds.  Follow.   

## 2013-11-17 NOTE — Assessment & Plan Note (Signed)
Low cholesterol diet and exercise.  Remains on Simvastatin.  Follow lipid panel and liver function.

## 2013-12-20 ENCOUNTER — Other Ambulatory Visit: Payer: Self-pay | Admitting: Internal Medicine

## 2014-01-17 ENCOUNTER — Other Ambulatory Visit: Payer: Self-pay | Admitting: Internal Medicine

## 2014-01-24 ENCOUNTER — Other Ambulatory Visit (INDEPENDENT_AMBULATORY_CARE_PROVIDER_SITE_OTHER): Payer: Medicare PPO

## 2014-01-24 DIAGNOSIS — Z1211 Encounter for screening for malignant neoplasm of colon: Secondary | ICD-10-CM

## 2014-01-24 LAB — FECAL OCCULT BLOOD, IMMUNOCHEMICAL: FECAL OCCULT BLD: NEGATIVE

## 2014-01-27 ENCOUNTER — Encounter: Payer: Self-pay | Admitting: *Deleted

## 2014-02-28 ENCOUNTER — Telehealth: Payer: Self-pay | Admitting: *Deleted

## 2014-02-28 NOTE — Telephone Encounter (Signed)
Form completed.  In your box.  

## 2014-02-28 NOTE — Telephone Encounter (Signed)
Form faxed

## 2014-02-28 NOTE — Telephone Encounter (Signed)
Pt states that she would like to use the company "[employer]" for her diabetic supplies. PT notified that forms will be completed & faxed back.

## 2014-03-17 NOTE — Telephone Encounter (Signed)
Opened in error

## 2014-03-21 ENCOUNTER — Ambulatory Visit (INDEPENDENT_AMBULATORY_CARE_PROVIDER_SITE_OTHER): Payer: Commercial Managed Care - HMO | Admitting: Internal Medicine

## 2014-03-21 ENCOUNTER — Encounter: Payer: Self-pay | Admitting: Internal Medicine

## 2014-03-21 VITALS — BP 130/70 | HR 77 | Temp 98.4°F | Ht 61.0 in | Wt 137.2 lb

## 2014-03-21 DIAGNOSIS — E119 Type 2 diabetes mellitus without complications: Secondary | ICD-10-CM

## 2014-03-21 DIAGNOSIS — E7800 Pure hypercholesterolemia, unspecified: Secondary | ICD-10-CM

## 2014-03-21 DIAGNOSIS — J45909 Unspecified asthma, uncomplicated: Secondary | ICD-10-CM

## 2014-03-21 DIAGNOSIS — G629 Polyneuropathy, unspecified: Secondary | ICD-10-CM

## 2014-03-21 DIAGNOSIS — I10 Essential (primary) hypertension: Secondary | ICD-10-CM

## 2014-03-21 DIAGNOSIS — G589 Mononeuropathy, unspecified: Secondary | ICD-10-CM

## 2014-03-21 LAB — BASIC METABOLIC PANEL
BUN: 22 mg/dL (ref 6–23)
CHLORIDE: 103 meq/L (ref 96–112)
CO2: 27 mEq/L (ref 19–32)
Calcium: 9.8 mg/dL (ref 8.4–10.5)
Creatinine, Ser: 0.9 mg/dL (ref 0.4–1.2)
GFR: 67.69 mL/min (ref >60.00)
Glucose, Bld: 123 mg/dL — ABNORMAL HIGH (ref 70–99)
POTASSIUM: 4.5 meq/L (ref 3.5–5.1)
SODIUM: 139 meq/L (ref 135–145)

## 2014-03-21 LAB — LIPID PANEL
Cholesterol: 174 mg/dL (ref 0–200)
HDL: 43.8 mg/dL (ref >39.00)
LDL Cholesterol: 85 mg/dL (ref 0–99)
Total CHOL/HDL Ratio: 4
Triglycerides: 228 mg/dL — ABNORMAL HIGH (ref 0.0–149.0)
VLDL: 45.6 mg/dL — AB (ref 0.0–40.0)

## 2014-03-21 LAB — HEPATIC FUNCTION PANEL
ALBUMIN: 4.1 g/dL (ref 3.5–5.2)
ALT: 17 U/L (ref 0–35)
AST: 21 U/L (ref 0–37)
Alkaline Phosphatase: 54 U/L (ref 39–117)
Bilirubin, Direct: 0.1 mg/dL (ref 0.0–0.3)
TOTAL PROTEIN: 7.3 g/dL (ref 6.0–8.3)
Total Bilirubin: 0.7 mg/dL (ref 0.2–1.2)

## 2014-03-21 LAB — VITAMIN B12: Vitamin B-12: 221 pg/mL (ref 211–911)

## 2014-03-21 LAB — HEMOGLOBIN A1C: HEMOGLOBIN A1C: 6.5 % (ref 4.6–6.5)

## 2014-03-21 LAB — TSH: TSH: 1.84 u[IU]/mL (ref 0.35–4.50)

## 2014-03-21 MED ORDER — GABAPENTIN 100 MG PO CAPS: ORAL_CAPSULE | ORAL | Status: DC

## 2014-03-21 NOTE — Progress Notes (Signed)
Pre visit review using our clinic review tool, if applicable. No additional management support is needed unless otherwise documented below in the visit note. 

## 2014-03-21 NOTE — Progress Notes (Signed)
Subjective:    [name], female    DOB: Feb 02, 1933, 80 y.o.   MRN: [id number]  HPI 80 year old female with past history of hypertension, diabetes, hypercholesterolemia and reactive airways disease who comes in today for a scheduled follow up.  Breathing stable.   She is eating and drinking well.  States her am sugars are averaging 120s.  No recorded sugar readings brought in.  Still seeing Dr Meredeth Ide.   No chest congestion.  No cough.  No sob or wheezing.  No nausea or vomiting.  No diarrhea.  She does report some burning of her feet that extends to mid lower leg.  Desires something to help with the discomfort.     Past Medical History  Diagnosis Date  . Reactive airway disease   . Hypertension   . Hypercholesterolemia   . Osteopenia   . Diabetes mellitus     Current Outpatient Prescriptions on File Prior to Visit  Medication Sig Dispense Refill  . ADVAIR DISKUS 250-50 MCG/DOSE AEPB INHALE ONE DOSE BY MOUTH EVERY 12 HOURS.  RINSE MOUTH AFTER EACH USE.  60 each  5  . aspirin 81 MG tablet Take 81 mg by mouth daily.      . calcium carbonate (OS-CAL) 600 MG TABS Take 600 mg by mouth daily.      . fluticasone (FLONASE) 50 MCG/ACT nasal spray USE TWO SPRAY IN EACH NOSTRIL EVERY DAY  16 g  5  . lisinopril-hydrochlorothiazide (PRINZIDE,ZESTORETIC) 20-12.5 MG per tablet TAKE ONE TABLET BY MOUTH ONCE DAILY  90 tablet  1  . metFORMIN (GLUCOPHAGE) 500 MG tablet TAKE ONE TABLET BY MOUTH TWICE DAILY WITH A MEAL  60 tablet  5  . Multiple Vitamins-Minerals (VISION FORMULA/LUTEIN PO) Take by mouth daily.      . simvastatin (ZOCOR) 10 MG tablet TAKE ONE TABLET BY MOUTH AT BEDTIME  90 tablet  1   No current facility-administered medications on file prior to visit.    Review of Systems Patient denies any headache, lightheadedness or dizziness.  No significant sinus or allergy symptoms.  No chest pain, tightness or palpitations.  No increased shortness of breath, cough or congestion.   Breathing stable.  No nausea or vomiting.  No acid reflux.  No abdominal pain or cramping.  No constipation, BRBPR or melana.  No diarrhea and no further flares with her bowels.  No urine change.  Sugars as outlined.  Bilateral feet - burning.       Objective:   Physical Exam  Filed Vitals:   03/21/14 0833  BP: 130/70  Pulse: 77  Temp: 98.4 F (36.9 C)   Blood pressure recheck: 72/83  80 year old female in no acute distress.   HEENT:  Nares- clear.  Oropharynx - without lesions. NECK:  Supple.  Nontender.  No audible bruit.  HEART:  Appears to be regular. LUNGS:  No crackles or wheezing audible.  Respirations even and unlabored.  RADIAL PULSE:  Equal bilaterally.    ABDOMEN:  Soft, nontender.  Bowel sounds present and normal.  No audible abdominal bruit.      EXTREMITIES:  No increased edema present.  DP pulses palpable and equal bilaterally.  SKIN:  No lesions.   NEURO:  Minimal decreased sensation distal - toes.          Assessment & Plan:  HEALTH MAINTENANCE.  Physical last visit.  Is s/p hysterectomy and does not require yearly pap smears.  Colonoscopy 2/10 revealed diverticulosis with no  polyps.  Mammogram 09/26/11 - BiRADS II.  Mammogram 11/02/12 - recommended follow up views.  11/06/12 - follow up mammo - Birads II.  11/05/13 - Birads I.  Bone density normal - 4/09.  Is to notify me when agreeable for a follow up bone density.  IFOB 01/24/14 - negative.  Scheduled a f/u colonoscopy next Friday.

## 2014-03-24 ENCOUNTER — Encounter: Payer: Self-pay | Admitting: Internal Medicine

## 2014-03-24 DIAGNOSIS — G629 Polyneuropathy, unspecified: Secondary | ICD-10-CM | POA: Insufficient documentation

## 2014-03-24 NOTE — Assessment & Plan Note (Signed)
Sugars she reports as outlined.  Check metabolic panel and a1c.  Sees Dr Gentry Fitz.

## 2014-03-24 NOTE — Assessment & Plan Note (Signed)
Symptoms as outlined.  Start gabapentin 100mg  1-2 q hs as directed.  Check B12.  Follow.

## 2014-03-24 NOTE — Assessment & Plan Note (Signed)
Low cholesterol diet and exercise.  Remains on Simvastatin.  Follow lipid panel and liver function.      

## 2014-03-24 NOTE — Assessment & Plan Note (Signed)
Breathing stable.  Sees Dr Fleming.  Follow.   

## 2014-03-24 NOTE — Assessment & Plan Note (Signed)
Blood pressure as outlined.  Same meds.  Follow.

## 2014-03-25 ENCOUNTER — Encounter: Payer: Self-pay | Admitting: *Deleted

## 2014-03-28 ENCOUNTER — Ambulatory Visit: Payer: Self-pay | Admitting: Gastroenterology

## 2014-04-08 ENCOUNTER — Encounter: Payer: Self-pay | Admitting: Internal Medicine

## 2014-04-15 ENCOUNTER — Ambulatory Visit: Payer: Self-pay | Admitting: Gastroenterology

## 2014-04-16 LAB — PATHOLOGY REPORT

## 2014-04-17 ENCOUNTER — Other Ambulatory Visit: Payer: Self-pay | Admitting: Internal Medicine

## 2014-05-05 ENCOUNTER — Ambulatory Visit: Payer: Commercial Managed Care - HMO | Admitting: Internal Medicine

## 2014-05-18 ENCOUNTER — Encounter: Payer: Self-pay | Admitting: Internal Medicine

## 2014-06-19 ENCOUNTER — Other Ambulatory Visit: Payer: Self-pay | Admitting: Internal Medicine

## 2014-07-04 ENCOUNTER — Encounter: Payer: Self-pay | Admitting: Internal Medicine

## 2014-07-04 ENCOUNTER — Other Ambulatory Visit: Payer: Self-pay | Admitting: Internal Medicine

## 2014-07-04 ENCOUNTER — Ambulatory Visit (INDEPENDENT_AMBULATORY_CARE_PROVIDER_SITE_OTHER): Payer: Commercial Managed Care - HMO | Admitting: Internal Medicine

## 2014-07-04 VITALS — BP 120/80 | HR 88 | Temp 98.9°F | Ht 61.0 in | Wt 134.0 lb

## 2014-07-04 DIAGNOSIS — I10 Essential (primary) hypertension: Secondary | ICD-10-CM

## 2014-07-04 DIAGNOSIS — E7800 Pure hypercholesterolemia, unspecified: Secondary | ICD-10-CM

## 2014-07-04 DIAGNOSIS — R300 Dysuria: Secondary | ICD-10-CM

## 2014-07-04 DIAGNOSIS — G629 Polyneuropathy, unspecified: Secondary | ICD-10-CM

## 2014-07-04 DIAGNOSIS — J4520 Mild intermittent asthma, uncomplicated: Secondary | ICD-10-CM

## 2014-07-04 DIAGNOSIS — N3000 Acute cystitis without hematuria: Secondary | ICD-10-CM

## 2014-07-04 DIAGNOSIS — R350 Frequency of micturition: Secondary | ICD-10-CM

## 2014-07-04 DIAGNOSIS — E119 Type 2 diabetes mellitus without complications: Secondary | ICD-10-CM

## 2014-07-04 DIAGNOSIS — G589 Mononeuropathy, unspecified: Secondary | ICD-10-CM

## 2014-07-04 DIAGNOSIS — J45909 Unspecified asthma, uncomplicated: Secondary | ICD-10-CM

## 2014-07-04 LAB — POCT URINALYSIS DIPSTICK
Bilirubin, UA: NEGATIVE
GLUCOSE UA: NEGATIVE
KETONES UA: NEGATIVE
Nitrite, UA: POSITIVE
Protein, UA: NEGATIVE
SPEC GRAV UA: 1.015
UROBILINOGEN UA: 1
pH, UA: 7

## 2014-07-04 MED ORDER — CIPROFLOXACIN HCL 250 MG PO TABS: 250.0000 mg | ORAL_TABLET | Freq: Two times a day (BID) | ORAL | Status: DC

## 2014-07-04 NOTE — Progress Notes (Signed)
Subjective:    [name], female    DOB: 28-Mar-1933, 80 y.o.   MRN: [id number]  HPI 80 year old female with past history of hypertension, diabetes, hypercholesterolemia and reactive airways disease who comes in today for a scheduled follow up.  Breathing stable.   She is eating and drinking well.  States her am sugars are averaging 114-124.  No recorded sugar readings brought in.  Still seeing Dr Meredeth Ide.   No chest congestion.  No cough.  No sob or wheezing.  No nausea or vomiting.  No diarrhea.  Noticed some burning with urination and increased frequency starting at the beginning of the week.  Took AZO.  No relief.  No vaginal discharge.  No abdominal pain or back pain.  No fever.      Past Medical History  Diagnosis Date  . Reactive airway disease   . Hypertension   . Hypercholesterolemia   . Osteopenia   . Diabetes mellitus     Current Outpatient Prescriptions on File Prior to Visit  Medication Sig Dispense Refill  . ADVAIR DISKUS 250-50 MCG/DOSE AEPB INHALE ONE DOSE BY MOUTH EVERY 12 HOURS.  RINSE MOUTH AFTER EACH USE.  60 each  5  . aspirin 81 MG tablet Take 81 mg by mouth daily.      . calcium carbonate (OS-CAL) 600 MG TABS Take 600 mg by mouth daily.      . fluticasone (FLONASE) 50 MCG/ACT nasal spray USE TWO SPRAY IN EACH NOSTRIL EVERY DAY  16 g  5  . gabapentin (NEURONTIN) 100 MG capsule 1-2 q hs  60 capsule  2  . lisinopril-hydrochlorothiazide (PRINZIDE,ZESTORETIC) 20-12.5 MG per tablet TAKE ONE TABLET BY MOUTH ONCE DAILY  90 tablet  0  . metFORMIN (GLUCOPHAGE) 500 MG tablet TAKE ONE TABLET BY MOUTH TWICE DAILY WITH A MEAL  60 tablet  5  . Multiple Vitamins-Minerals (VISION FORMULA/LUTEIN PO) Take by mouth daily.      . simvastatin (ZOCOR) 10 MG tablet TAKE ONE TABLET BY MOUTH AT BEDTIME  90 tablet  0   No current facility-administered medications on file prior to visit.    Review of Systems Patient denies any headache, lightheadedness or dizziness.  No  significant sinus or allergy symptoms.  No chest pain, tightness or palpitations.  No increased shortness of breath, cough or congestion.  Breathing stable.  No nausea or vomiting.  No acid reflux.  No abdominal pain or cramping.  No constipation, BRBPR or melana.  No diarrhea and no further flares with her bowels.  Urinary symptoms as outlined.  Sugars as outlined.      Objective:   Physical Exam  Filed Vitals:   07/04/14 1134  BP: 120/80  Pulse: 88  Temp: 98.9 F (37.2 C)   Blood pressure recheck: 132/78, pulse 71  80 year old female in no acute distress.   HEENT:  Nares- clear.  Oropharynx - without lesions. NECK:  Supple.  Nontender.  No audible bruit.  HEART:  Appears to be regular. LUNGS:  No crackles or wheezing audible.  Respirations even and unlabored.  RADIAL PULSE:  Equal bilaterally.    ABDOMEN:  Soft, nontender.  Bowel sounds present and normal.  No audible abdominal bruit.      EXTREMITIES:  No increased edema present.  DP pulses palpable and equal bilaterally.  SKIN:  No lesions.       Assessment & Plan:  HEALTH MAINTENANCE.  Physical 11/15/13.  Is s/p hysterectomy and does  not require yearly pap smears.  Colonoscopy 2/10 revealed diverticulosis with no polyps.  Mammogram 09/26/11 - BiRADS II.  Mammogram 11/02/12 - recommended follow up views.  11/06/12 - follow up mammo - Birads II.  11/05/13 - Birads I.  Bone density normal - 4/09.  Is to notify me when agreeable for a follow up bone density.  IFOB 01/24/14 - negative.  F/U Colonoscopy 03/28/14 - preparation of colon poor.  Stool in the rectum and sigmoid colon.  Had to repeat colon 04/05/14 - with cecal polyp - hyperplastic.

## 2014-07-04 NOTE — Progress Notes (Signed)
Order placed for urine culture 

## 2014-07-04 NOTE — Progress Notes (Signed)
Pre visit review using our clinic review tool, if applicable. No additional management support is needed unless otherwise documented below in the visit note. 

## 2014-07-07 ENCOUNTER — Other Ambulatory Visit: Payer: Self-pay | Admitting: Internal Medicine

## 2014-07-07 ENCOUNTER — Encounter: Payer: Self-pay | Admitting: Internal Medicine

## 2014-07-07 DIAGNOSIS — N390 Urinary tract infection, site not specified: Secondary | ICD-10-CM | POA: Insufficient documentation

## 2014-07-07 DIAGNOSIS — R319 Hematuria, unspecified: Secondary | ICD-10-CM

## 2014-07-07 LAB — CULTURE, URINE COMPREHENSIVE

## 2014-07-07 NOTE — Assessment & Plan Note (Signed)
Symptoms as outlined.  On gabapentin.  Stable.

## 2014-07-07 NOTE — Assessment & Plan Note (Signed)
Low cholesterol diet and exercise.  Remains on Simvastatin.  Follow lipid panel and liver function.      

## 2014-07-07 NOTE — Assessment & Plan Note (Signed)
Urine dip c/w uti.  Treat with cipro.  Await sensitivities.

## 2014-07-07 NOTE — Assessment & Plan Note (Signed)
Blood pressure as outlined.  Same meds.  Follow.   

## 2014-07-07 NOTE — Progress Notes (Signed)
F/u urinalysis ordered.

## 2014-07-07 NOTE — Assessment & Plan Note (Signed)
Breathing stable.  Sees Dr Meredeth Ide.  Follow.

## 2014-07-07 NOTE — Assessment & Plan Note (Signed)
Sugars she reports as outlined.  Check metabolic panel and a1c.  Sees Dr Rebeck.    

## 2014-07-17 ENCOUNTER — Other Ambulatory Visit: Payer: Self-pay | Admitting: Internal Medicine

## 2014-07-22 ENCOUNTER — Ambulatory Visit (INDEPENDENT_AMBULATORY_CARE_PROVIDER_SITE_OTHER): Payer: Commercial Managed Care - HMO | Admitting: *Deleted

## 2014-07-22 ENCOUNTER — Other Ambulatory Visit (INDEPENDENT_AMBULATORY_CARE_PROVIDER_SITE_OTHER): Payer: Commercial Managed Care - HMO

## 2014-07-22 DIAGNOSIS — N3000 Acute cystitis without hematuria: Secondary | ICD-10-CM

## 2014-07-22 DIAGNOSIS — E7800 Pure hypercholesterolemia, unspecified: Secondary | ICD-10-CM

## 2014-07-22 DIAGNOSIS — Z23 Encounter for immunization: Secondary | ICD-10-CM

## 2014-07-22 DIAGNOSIS — R7989 Other specified abnormal findings of blood chemistry: Secondary | ICD-10-CM

## 2014-07-22 DIAGNOSIS — E119 Type 2 diabetes mellitus without complications: Secondary | ICD-10-CM

## 2014-07-22 DIAGNOSIS — R319 Hematuria, unspecified: Secondary | ICD-10-CM

## 2014-07-22 LAB — LIPID PANEL
CHOL/HDL RATIO: 4
CHOLESTEROL: 184 mg/dL (ref 0–200)
HDL: 47.4 mg/dL (ref >39.00)
NonHDL: 136.6
Triglycerides: 259 mg/dL — ABNORMAL HIGH (ref 0.0–149.0)
VLDL: 51.8 mg/dL — ABNORMAL HIGH (ref 0.0–40.0)

## 2014-07-22 LAB — BASIC METABOLIC PANEL
BUN: 19 mg/dL (ref 6–23)
CO2: 26 mEq/L (ref 19–32)
Calcium: 9.6 mg/dL (ref 8.4–10.5)
Chloride: 104 mEq/L (ref 96–112)
Creatinine, Ser: 0.8 mg/dL (ref 0.4–1.2)
GFR: 74.6 mL/min (ref >60.00)
Glucose, Bld: 130 mg/dL — ABNORMAL HIGH (ref 70–99)
POTASSIUM: 4 meq/L (ref 3.5–5.1)
Sodium: 139 mEq/L (ref 135–145)

## 2014-07-22 LAB — URINALYSIS, ROUTINE W REFLEX MICROSCOPIC
BILIRUBIN URINE: NEGATIVE
Ketones, ur: NEGATIVE
Nitrite: NEGATIVE
PH: 6 (ref 5.0–8.0)
Specific Gravity, Urine: 1.015 (ref 1.000–1.030)
TOTAL PROTEIN, URINE-UPE24: NEGATIVE
URINE GLUCOSE: NEGATIVE
Urobilinogen, UA: 0.2 (ref 0.0–1.0)

## 2014-07-22 LAB — CBC WITH DIFFERENTIAL/PLATELET
BASOS ABS: 0 10*3/uL (ref 0.0–0.1)
Basophils Relative: 0.2 % (ref 0.0–3.0)
EOS PCT: 6.5 % — AB (ref 0.0–5.0)
Eosinophils Absolute: 0.6 10*3/uL (ref 0.0–0.7)
HEMATOCRIT: 41.4 % (ref 36.0–46.0)
Hemoglobin: 13.9 g/dL (ref 12.0–15.0)
LYMPHS ABS: 2.5 10*3/uL (ref 0.7–4.0)
LYMPHS PCT: 26.3 % (ref 12.0–46.0)
MCHC: 33.6 g/dL (ref 30.0–36.0)
MCV: 88 fl (ref 78.0–100.0)
MONOS PCT: 4.5 % (ref 3.0–12.0)
Monocytes Absolute: 0.4 10*3/uL (ref 0.1–1.0)
Neutro Abs: 5.9 10*3/uL (ref 1.4–7.7)
Neutrophils Relative %: 62.5 % (ref 43.0–77.0)
PLATELETS: 229 10*3/uL (ref 150.0–400.0)
RBC: 4.71 Mil/uL (ref 3.87–5.11)
RDW: 13.6 % (ref 11.5–15.5)
WBC: 9.4 10*3/uL (ref 4.0–10.5)

## 2014-07-22 LAB — HEMOGLOBIN A1C: HEMOGLOBIN A1C: 6.3 % (ref 4.6–6.5)

## 2014-07-22 LAB — MICROALBUMIN / CREATININE URINE RATIO
Creatinine,U: 153.8 mg/dL
Microalb Creat Ratio: 1.5 mg/g (ref 0.0–30.0)
Microalb, Ur: 2.3 mg/dL — ABNORMAL HIGH (ref 0.0–1.9)

## 2014-07-22 LAB — HEPATIC FUNCTION PANEL
ALT: 17 U/L (ref 0–35)
AST: 22 U/L (ref 0–37)
Albumin: 4.6 g/dL (ref 3.5–5.2)
Alkaline Phosphatase: 63 U/L (ref 39–117)
BILIRUBIN DIRECT: 0.1 mg/dL (ref 0.0–0.3)
TOTAL PROTEIN: 7.9 g/dL (ref 6.0–8.3)
Total Bilirubin: 0.6 mg/dL (ref 0.2–1.2)

## 2014-07-22 LAB — LDL CHOLESTEROL, DIRECT: Direct LDL: 124.2 mg/dL

## 2014-08-21 ENCOUNTER — Other Ambulatory Visit: Payer: Self-pay | Admitting: Internal Medicine

## 2014-08-22 ENCOUNTER — Other Ambulatory Visit: Payer: Self-pay | Admitting: *Deleted

## 2014-08-22 MED ORDER — FLUTICASONE PROPIONATE 50 MCG/ACT NA SUSP: NASAL | Status: DC

## 2014-09-18 ENCOUNTER — Other Ambulatory Visit: Payer: Self-pay | Admitting: Internal Medicine

## 2014-09-26 ENCOUNTER — Other Ambulatory Visit: Payer: Self-pay | Admitting: Internal Medicine

## 2014-10-21 ENCOUNTER — Other Ambulatory Visit: Payer: Self-pay | Admitting: Internal Medicine

## 2014-11-06 ENCOUNTER — Ambulatory Visit: Payer: Self-pay | Admitting: Internal Medicine

## 2014-11-06 DIAGNOSIS — Z1231 Encounter for screening mammogram for malignant neoplasm of breast: Secondary | ICD-10-CM | POA: Diagnosis not present

## 2014-11-06 LAB — HM MAMMOGRAPHY: HM Mammogram: NEGATIVE

## 2014-11-18 ENCOUNTER — Encounter: Payer: Commercial Managed Care - HMO | Admitting: Internal Medicine

## 2014-11-25 ENCOUNTER — Encounter: Payer: Self-pay | Admitting: Internal Medicine

## 2015-01-06 DIAGNOSIS — H04123 Dry eye syndrome of bilateral lacrimal glands: Secondary | ICD-10-CM | POA: Diagnosis not present

## 2015-01-06 DIAGNOSIS — E119 Type 2 diabetes mellitus without complications: Secondary | ICD-10-CM | POA: Diagnosis not present

## 2015-01-06 DIAGNOSIS — H25813 Combined forms of age-related cataract, bilateral: Secondary | ICD-10-CM | POA: Diagnosis not present

## 2015-01-06 LAB — HM DIABETES EYE EXAM

## 2015-01-07 ENCOUNTER — Encounter: Payer: Self-pay | Admitting: Internal Medicine

## 2015-01-16 ENCOUNTER — Ambulatory Visit (INDEPENDENT_AMBULATORY_CARE_PROVIDER_SITE_OTHER): Payer: Commercial Managed Care - HMO | Admitting: Internal Medicine

## 2015-01-16 ENCOUNTER — Encounter: Payer: Self-pay | Admitting: Internal Medicine

## 2015-01-16 VITALS — BP 138/64 | HR 88 | Temp 99.0°F | Resp 14 | Ht 60.0 in | Wt 132.6 lb

## 2015-01-16 DIAGNOSIS — R300 Dysuria: Secondary | ICD-10-CM | POA: Diagnosis not present

## 2015-01-16 DIAGNOSIS — E7800 Pure hypercholesterolemia, unspecified: Secondary | ICD-10-CM

## 2015-01-16 DIAGNOSIS — E119 Type 2 diabetes mellitus without complications: Secondary | ICD-10-CM | POA: Diagnosis not present

## 2015-01-16 DIAGNOSIS — I10 Essential (primary) hypertension: Secondary | ICD-10-CM

## 2015-01-16 DIAGNOSIS — Z0000 Encounter for general adult medical examination without abnormal findings: Secondary | ICD-10-CM

## 2015-01-16 DIAGNOSIS — J4520 Mild intermittent asthma, uncomplicated: Secondary | ICD-10-CM

## 2015-01-16 DIAGNOSIS — N3000 Acute cystitis without hematuria: Secondary | ICD-10-CM

## 2015-01-16 LAB — POCT URINALYSIS DIPSTICK
BILIRUBIN UA: NEGATIVE
Glucose, UA: NEGATIVE
KETONES UA: 40
Nitrite, UA: NEGATIVE
Protein, UA: NEGATIVE
SPEC GRAV UA: 1.02
UROBILINOGEN UA: 0.2
pH, UA: 5.5

## 2015-01-16 LAB — HEMOGLOBIN A1C: Hgb A1c MFr Bld: 6.4 % (ref 4.6–6.5)

## 2015-01-16 MED ORDER — CIPROFLOXACIN HCL 250 MG PO TABS: 250.0000 mg | ORAL_TABLET | Freq: Two times a day (BID) | ORAL | Status: DC

## 2015-01-16 NOTE — Progress Notes (Signed)
Pre visit review using our clinic review tool, if applicable. No additional management support is needed unless otherwise documented below in the visit note. 

## 2015-01-16 NOTE — Progress Notes (Signed)
Patient ID: Regan Lemming, female   DOB: Mar 20, 1933, 81 y.o.   MRN: [id number]   Subjective:    Patient ID: Regan Lemming, female    DOB: [birth year], 81 y.o.   MRN: [id number]  HPI  Patient here for a physical exam.  States noticed two days ago, dysuria.  Worse in the am.  Increased frequency.  No nausea or vomiting.  No fever.  Sugars are doing well.  Highest reading 122.  AM sugars two hours after eat - 112-118.  Saw optometrist last week.  Eyes ok.  Breathing stable.  Tries to stay active.  No cardiac symptoms with increased activity or exertion.    Past Medical History  Diagnosis Date  . Reactive airway disease   . Hypertension   . Hypercholesterolemia   . Osteopenia   . Diabetes mellitus     Current Outpatient Prescriptions on File Prior to Visit  Medication Sig Dispense Refill  . ADVAIR DISKUS 250-50 MCG/DOSE AEPB INHALE ONE DOSE BY MOUTH EVERY 12 HOURS.  RINSE MOUTH AFTER EACH USE. 60 each 5  . aspirin 81 MG tablet Take 81 mg by mouth daily.    . calcium carbonate (OS-CAL) 600 MG TABS Take 600 mg by mouth daily.    . fluticasone (FLONASE) 50 MCG/ACT nasal spray USE TWO SPRAY IN EACH NOSTRIL EVERY DAY 16 g 5  . gabapentin (NEURONTIN) 100 MG capsule TAKE ONE TO TWO CAPSULES BY MOUTH AT BEDTIME 60 capsule 2  . lisinopril-hydrochlorothiazide (PRINZIDE,ZESTORETIC) 20-12.5 MG per tablet TAKE ONE TABLET BY MOUTH ONCE DAILY 90 tablet 1  . metFORMIN (GLUCOPHAGE) 500 MG tablet TAKE ONE TABLET BY MOUTH TWICE DAILY WITH A MEAL 60 tablet 5  . Multiple Vitamins-Minerals (VISION FORMULA/LUTEIN PO) Take by mouth daily.    . simvastatin (ZOCOR) 10 MG tablet TAKE ONE TABLET BY MOUTH AT BEDTIME 90 tablet 1   No current facility-administered medications on file prior to visit.    Review of Systems  Constitutional: Negative for appetite change and unexpected weight change.  HENT: Negative for congestion and sinus pressure.   Eyes: Negative for pain and visual disturbance.    Respiratory: Negative for cough, chest tightness and shortness of breath.   Cardiovascular: Negative for chest pain, palpitations and leg swelling.  Gastrointestinal: Negative for nausea, vomiting, abdominal pain and diarrhea.  Genitourinary: Positive for dysuria and frequency. Negative for hematuria.  Musculoskeletal: Negative for back pain and joint swelling.  Skin: Negative for color change and rash.  Neurological: Negative for dizziness, light-headedness and headaches.  Hematological: Negative for adenopathy. Does not bruise/bleed easily.  Psychiatric/Behavioral: Negative for dysphoric mood and agitation.       Objective:    Physical Exam  Constitutional: She is oriented to person, place, and time. She appears well-developed and well-nourished.  HENT:  Nose: Nose normal.  Mouth/Throat: Oropharynx is clear and moist.  Eyes: Right eye exhibits no discharge. Left eye exhibits no discharge. No scleral icterus.  Neck: Neck supple. No thyromegaly present.  Cardiovascular: Normal rate and regular rhythm.   Pulmonary/Chest: Breath sounds normal. No accessory muscle usage. No tachypnea. No respiratory distress. She has no decreased breath sounds. She has no wheezes. She has no rhonchi. Right breast exhibits no inverted nipple, no mass, no nipple discharge and no tenderness (no axillary adenopathy). Left breast exhibits no inverted nipple, no mass, no nipple discharge and no tenderness (no axilarry adenopathy).  Abdominal: Soft. Bowel sounds are normal. There is no tenderness.  Musculoskeletal: She exhibits no  edema or tenderness.  Lymphadenopathy:    She has no cervical adenopathy.  Neurological: She is alert and oriented to person, place, and time.  Skin: Skin is warm. No rash noted.  Psychiatric: She has a normal mood and affect. Her behavior is normal.    BP 138/64 mmHg  Pulse 88  Temp(Src) 99 F (37.2 C) (Oral)  Resp 14  Ht 5' (1.524 m)  Wt 132 lb 9.6 oz (60.147 kg)  BMI 25.90  kg/m2  SpO2 95% Wt Readings from Last 3 Encounters:  01/16/15 132 lb 9.6 oz (60.147 kg)  07/04/14 134 lb (60.782 kg)  03/21/14 137 lb 4 oz (62.256 kg)     Lab Results  Component Value Date   WBC 9.4 07/22/2014   HGB 13.9 07/22/2014   HCT 41.4 07/22/2014   PLT 229.0 07/22/2014   GLUCOSE 98 01/16/2015   CHOL 160 01/16/2015   TRIG 179.0* 01/16/2015   HDL 47.30 01/16/2015   LDLDIRECT 124.2 07/22/2014   LDLCALC 77 01/16/2015   ALT 13 01/16/2015   AST 16 01/16/2015   NA 140 01/16/2015   K 4.0 01/16/2015   CL 102 01/16/2015   CREATININE 0.84 01/16/2015   BUN 22 01/16/2015   CO2 25 01/16/2015   TSH 1.84 03/21/2014   HGBA1C 6.4 01/16/2015   MICROALBUR 2.3* 07/22/2014       Assessment & Plan:   Problem List Items Addressed This Visit    Diabetes mellitus    Sugars as outlined.  Continue current medication regimen.  Follow met b and a1c.        Relevant Orders   Hemoglobin A1c (Completed)   Health care maintenance    Physical today.  Mammogram 11/06/14 - Birads I.  S/p hysterectomy.  Colonoscopy 03/28/14 - poor prep.  04/05/14 - cecal hyperplastic polyp.        Hypercholesterolemia    On simvastatin.  Check lipid panel and liver function tests.        Relevant Orders   Lipid panel (Completed)   Hepatic function panel (Completed)   Hypertension    Blood pressure doing well.  Same medication regimen.  Follow pressures.  Follow metabolic panel.       Relevant Orders   Basic metabolic panel (Completed)   Reactive airway disease    Breathing doing well.  Follow.        UTI (urinary tract infection)    Dysuria and increased frequency as outlined.  Treat with cipro as directed.  Stay hydrated.  Follow.         Other Visit Diagnoses    Dysuria    -  Primary    Relevant Orders    POCT Urinalysis Dipstick (Completed)    CULTURE, URINE COMPREHENSIVE (Completed)      I spent 25 minutes with the patient and more than 50% of the time was spent in consultation regarding  the above.     Einar Pheasant, MD

## 2015-01-19 ENCOUNTER — Other Ambulatory Visit: Payer: Self-pay | Admitting: Internal Medicine

## 2015-01-19 DIAGNOSIS — R319 Hematuria, unspecified: Secondary | ICD-10-CM

## 2015-01-19 LAB — BASIC METABOLIC PANEL
BUN: 22 mg/dL (ref 6–23)
CO2: 25 meq/L (ref 19–32)
Calcium: 11 mg/dL — ABNORMAL HIGH (ref 8.4–10.5)
Chloride: 102 mEq/L (ref 96–112)
Creatinine, Ser: 0.84 mg/dL (ref 0.40–1.20)
GFR: 69.41 mL/min (ref >60.00)
Glucose, Bld: 98 mg/dL (ref 70–99)
POTASSIUM: 4 meq/L (ref 3.5–5.1)
Sodium: 140 mEq/L (ref 135–145)

## 2015-01-19 LAB — LIPID PANEL
CHOL/HDL RATIO: 3
Cholesterol: 160 mg/dL (ref 0–200)
HDL: 47.3 mg/dL (ref >39.00)
LDL CALC: 77 mg/dL (ref 0–99)
NONHDL: 112.7
Triglycerides: 179 mg/dL — ABNORMAL HIGH (ref 0.0–149.0)
VLDL: 35.8 mg/dL (ref 0.0–40.0)

## 2015-01-19 LAB — HEPATIC FUNCTION PANEL
ALT: 13 U/L (ref 0–35)
AST: 16 U/L (ref 0–37)
Albumin: 4.6 g/dL (ref 3.5–5.2)
Alkaline Phosphatase: 63 U/L (ref 39–117)
BILIRUBIN DIRECT: 0.1 mg/dL (ref 0.0–0.3)
BILIRUBIN TOTAL: 0.5 mg/dL (ref 0.2–1.2)
Total Protein: 7.4 g/dL (ref 6.0–8.3)

## 2015-01-19 LAB — CULTURE, URINE COMPREHENSIVE

## 2015-01-19 NOTE — Progress Notes (Signed)
Order placed for urinalysis follow up.

## 2015-01-20 ENCOUNTER — Other Ambulatory Visit: Payer: Self-pay | Admitting: Internal Medicine

## 2015-01-20 NOTE — Progress Notes (Signed)
Order placed for calcium f/u

## 2015-01-23 ENCOUNTER — Other Ambulatory Visit: Payer: Self-pay | Admitting: Internal Medicine

## 2015-01-25 ENCOUNTER — Encounter: Payer: Self-pay | Admitting: Internal Medicine

## 2015-01-25 DIAGNOSIS — Z0000 Encounter for general adult medical examination without abnormal findings: Secondary | ICD-10-CM | POA: Insufficient documentation

## 2015-01-25 NOTE — Assessment & Plan Note (Signed)
Physical today.  Mammogram 11/06/14 - Birads I.  S/p hysterectomy.  Colonoscopy 03/28/14 - poor prep.  04/05/14 - cecal hyperplastic polyp.

## 2015-01-25 NOTE — Assessment & Plan Note (Signed)
Dysuria and increased frequency as outlined.  Treat with cipro as directed.  Stay hydrated.  Follow.

## 2015-01-25 NOTE — Assessment & Plan Note (Signed)
On simvastatin.  Check lipid panel and liver function tests.  

## 2015-01-25 NOTE — Assessment & Plan Note (Signed)
Breathing doing well.  Follow.   

## 2015-01-25 NOTE — Assessment & Plan Note (Signed)
Sugars as outlined.  Continue current medication regimen.  Follow met b and a1c.

## 2015-01-25 NOTE — Assessment & Plan Note (Signed)
Blood pressure doing well.  Same medication regimen.  Follow pressures.  Follow metabolic panel.   

## 2015-01-28 ENCOUNTER — Other Ambulatory Visit (INDEPENDENT_AMBULATORY_CARE_PROVIDER_SITE_OTHER): Payer: Commercial Managed Care - HMO

## 2015-01-28 DIAGNOSIS — R319 Hematuria, unspecified: Secondary | ICD-10-CM

## 2015-01-28 LAB — URINALYSIS, ROUTINE W REFLEX MICROSCOPIC
BILIRUBIN URINE: NEGATIVE
HGB URINE DIPSTICK: NEGATIVE
KETONES UR: NEGATIVE
Nitrite: NEGATIVE
PH: 6 (ref 5.0–8.0)
RBC / HPF: NONE SEEN (ref 0–?)
Specific Gravity, Urine: 1.015 (ref 1.000–1.030)
Total Protein, Urine: NEGATIVE
Urine Glucose: NEGATIVE
Urobilinogen, UA: 0.2 (ref 0.0–1.0)

## 2015-01-28 LAB — CALCIUM: Calcium: 10.1 mg/dL (ref 8.4–10.5)

## 2015-01-29 LAB — CALCIUM, IONIZED: Calcium, Ion: 1.28 mmol/L (ref 1.12–1.32)

## 2015-01-30 ENCOUNTER — Encounter: Payer: Self-pay | Admitting: *Deleted

## 2015-02-20 DIAGNOSIS — R300 Dysuria: Secondary | ICD-10-CM | POA: Diagnosis not present

## 2015-02-20 DIAGNOSIS — N3090 Cystitis, unspecified without hematuria: Secondary | ICD-10-CM | POA: Diagnosis not present

## 2015-02-25 DIAGNOSIS — J4520 Mild intermittent asthma, uncomplicated: Secondary | ICD-10-CM | POA: Diagnosis not present

## 2015-03-12 ENCOUNTER — Other Ambulatory Visit: Payer: Self-pay | Admitting: Internal Medicine

## 2015-03-25 ENCOUNTER — Other Ambulatory Visit: Payer: Self-pay | Admitting: Internal Medicine

## 2015-04-24 ENCOUNTER — Ambulatory Visit: Payer: Commercial Managed Care - HMO | Admitting: Primary Care

## 2015-04-24 ENCOUNTER — Telehealth: Payer: Self-pay

## 2015-04-24 DIAGNOSIS — R300 Dysuria: Secondary | ICD-10-CM | POA: Diagnosis not present

## 2015-04-24 NOTE — Telephone Encounter (Signed)
The patient stated she is unable to come in for an apt for her possible UTI due not having transportation.  She was offered an apt at [facility].  She states UTI's are a continuous problem, so she is hoping Dr.Scott will call a medication in.

## 2015-04-24 NOTE — Telephone Encounter (Signed)
Please advise in Dr Scotts absence. 

## 2015-04-24 NOTE — Telephone Encounter (Signed)
Spoke with pt, she states she will go to [facility]

## 2015-04-24 NOTE — Telephone Encounter (Signed)
No. She will need to be seen at [facility]. Needs to be evaluated prior to starting antibiotics or other medication.

## 2015-04-30 ENCOUNTER — Other Ambulatory Visit: Payer: Self-pay | Admitting: Internal Medicine

## 2015-05-22 ENCOUNTER — Encounter: Payer: Self-pay | Admitting: Internal Medicine

## 2015-05-22 ENCOUNTER — Ambulatory Visit (INDEPENDENT_AMBULATORY_CARE_PROVIDER_SITE_OTHER): Payer: Commercial Managed Care - HMO | Admitting: Internal Medicine

## 2015-05-22 VITALS — BP 110/70 | HR 82 | Temp 98.7°F | Ht 60.0 in | Wt 130.4 lb

## 2015-05-22 DIAGNOSIS — R5383 Other fatigue: Secondary | ICD-10-CM

## 2015-05-22 DIAGNOSIS — G629 Polyneuropathy, unspecified: Secondary | ICD-10-CM

## 2015-05-22 DIAGNOSIS — I10 Essential (primary) hypertension: Secondary | ICD-10-CM

## 2015-05-22 DIAGNOSIS — J4520 Mild intermittent asthma, uncomplicated: Secondary | ICD-10-CM

## 2015-05-22 DIAGNOSIS — E7800 Pure hypercholesterolemia, unspecified: Secondary | ICD-10-CM

## 2015-05-22 DIAGNOSIS — E119 Type 2 diabetes mellitus without complications: Secondary | ICD-10-CM | POA: Diagnosis not present

## 2015-05-22 DIAGNOSIS — N3000 Acute cystitis without hematuria: Secondary | ICD-10-CM

## 2015-05-22 DIAGNOSIS — Z0000 Encounter for general adult medical examination without abnormal findings: Secondary | ICD-10-CM

## 2015-05-22 LAB — HM DIABETES FOOT EXAM

## 2015-05-22 NOTE — Progress Notes (Signed)
Pre visit review using our clinic review tool, if applicable. No additional management support is needed unless otherwise documented below in the visit note. 

## 2015-05-22 NOTE — Progress Notes (Signed)
[name], female   DOB: 07-Feb-1933, 81 y.o.   MRN: [id number]   Subjective:    [name], female    DOB: 07/06/1933, 81 y.o.   MRN: [id number]  HPI  Patient here for a scheduled follow up.  Breathing stable.  No cough or congestion.  Seeing Dr Raul Del.  Last evaluated in April.  No changes made.  Tries to stay active.  No cardiac symptoms with increased activity or exertion.  No acid reflux.  Brought in no sugar readings.  States this am - blood sugar 118.  Bowels stable.  Does report some increased fatigue.  No pain.  No specific complaints.  Just general fatigue.     Past Medical History  Diagnosis Date  . Reactive airway disease   . Hypertension   . Hypercholesterolemia   . Osteopenia   . Diabetes mellitus     Outpatient Encounter Prescriptions as of 05/22/2015  Medication Sig  . ADVAIR DISKUS 250-50 MCG/DOSE AEPB INHALE ONE DOSE BY MOUTH EVERY 12 HOURS. RINSE MOUTH AFTER EACH USE  . aspirin 81 MG tablet Take 81 mg by mouth daily.  . fluticasone (FLONASE) 50 MCG/ACT nasal spray USE TWO SPRAY IN EACH NOSTRIL EVERY DAY  . lisinopril-hydrochlorothiazide (PRINZIDE,ZESTORETIC) 20-12.5 MG per tablet TAKE ONE TABLET BY MOUTH ONCE DAILY  . metFORMIN (GLUCOPHAGE) 500 MG tablet TAKE ONE TABLET BY MOUTH TWICE DAILY WITH A MEAL  . Multiple Vitamins-Minerals (VISION FORMULA/LUTEIN PO) Take by mouth daily.  . simvastatin (ZOCOR) 10 MG tablet TAKE ONE TABLET BY MOUTH AT BEDTIME  . calcium carbonate (OS-CAL) 600 MG TABS Take 600 mg by mouth daily.  . [DISCONTINUED] ciprofloxacin (CIPRO) 250 MG tablet Take 1 tablet (250 mg total) by mouth 2 (two) times daily.  . [DISCONTINUED] gabapentin (NEURONTIN) 100 MG capsule TAKE ONE TO TWO CAPSULES BY MOUTH AT BEDTIME (Patient not taking: Reported on 05/22/2015)   No facility-administered encounter medications on file as of 05/22/2015.    Review of Systems  Constitutional: Positive for fatigue. Negative for appetite  change and unexpected weight change.  HENT: Negative for congestion and sinus pressure.   Respiratory: Negative for cough, chest tightness and shortness of breath.   Cardiovascular: Negative for chest pain, palpitations and leg swelling.  Gastrointestinal: Negative for nausea, vomiting, abdominal pain and diarrhea.  Genitourinary: Negative for dysuria and difficulty urinating.  Skin: Negative for color change and rash.  Neurological: Negative for dizziness, light-headedness and headaches.  Psychiatric/Behavioral: Negative for dysphoric mood and agitation.       Objective:     Blood pressure recheck:  132/68  Physical Exam  Constitutional: She appears well-developed and well-nourished. No distress.  HENT:  Nose: Nose normal.  Mouth/Throat: Oropharynx is clear and moist.  Neck: Neck supple. No thyromegaly present.  Cardiovascular: Normal rate and regular rhythm.   Pulmonary/Chest: Breath sounds normal. No respiratory distress. She has no wheezes.  Abdominal: Soft. Bowel sounds are normal. There is no tenderness.  Musculoskeletal: She exhibits no edema or tenderness.  Lymphadenopathy:    She has no cervical adenopathy.  Skin: No rash noted. No erythema.  Psychiatric: She has a normal mood and affect. Her behavior is normal.    BP 110/70 mmHg  Pulse 82  Temp(Src) 98.7 F (37.1 C) (Oral)  Ht 5' (1.524 m)  Wt 130 lb 6 oz (59.138 kg)  BMI 25.46 kg/m2  SpO2 94% Wt Readings from Last 3 Encounters:  05/22/15 130 lb 6 oz (59.138 kg)  01/16/15 132 lb 9.6 oz (60.147 kg)  07/04/14 134 lb (60.782 kg)     Lab Results  Component Value Date   WBC 9.4 07/22/2014   HGB 13.9 07/22/2014   HCT 41.4 07/22/2014   PLT 229.0 07/22/2014   GLUCOSE 98 01/16/2015   CHOL 160 01/16/2015   TRIG 179.0* 01/16/2015   HDL 47.30 01/16/2015   LDLDIRECT 124.2 07/22/2014   LDLCALC 77 01/16/2015   ALT 13 01/16/2015   AST 16 01/16/2015   NA 140 01/16/2015   K 4.0 01/16/2015   CL 102 01/16/2015    CREATININE 0.84 01/16/2015   BUN 22 01/16/2015   CO2 25 01/16/2015   TSH 1.84 03/21/2014   HGBA1C 6.4 01/16/2015   MICROALBUR 2.3* 07/22/2014       Assessment & Plan:   Problem List Items Addressed This Visit    Diabetes mellitus    Sugars have been doing well.  Same medication regimen.  Follow sugars.  Follow metabolic panel and Z3G.  Had eye exam 12/2014.        Relevant Orders   Hemoglobin A1c   Fatigue - Primary    Increased fatigue.  No specific pains or complaints.  No cardiac symptoms with increased activity or exertion.  No sob.  Check cbc, met c and tsh.        Relevant Orders   TSH   Vitamin B12   Health care maintenance    Physical 01/16/15.   Mammogram 11/06/14 - Birads I.  Colonoscopy 01/27/15 - poor prep.  Was to repeat.  Need to see if had repeat colonoscopy.        Hypercholesterolemia    Low cholesterol diet and exercise.  On simvastatin.  Follow lipid panel and liver function tests.       Relevant Orders   Lipid panel   Hepatic function panel   Hypertension    Blood pressure under good control.  Continue same medication regimen.  Follow pressures.  Follow metabolic panel.        Relevant Orders   CBC with Differential/Platelet   Basic metabolic panel   Neuropathy    Off gabapentin.  Did not feel it helped.  Desires not to try anything more at this time.  Follow.        Reactive airway disease    Mild intermittent asthma.  Followed by Dr Raul Del.  Breathing stable on current regimen.        UTI (urinary tract infection)    No current symptoms.  Will need to follow.  If persistent reoccurring infections, will need urology evaluation.         I spent 25 minutes with the patient and more than 50% of the time was spent in consultation regarding the above.     Einar Pheasant, MD

## 2015-05-24 ENCOUNTER — Encounter: Payer: Self-pay | Admitting: Internal Medicine

## 2015-05-24 DIAGNOSIS — R5383 Other fatigue: Secondary | ICD-10-CM | POA: Insufficient documentation

## 2015-05-24 NOTE — Assessment & Plan Note (Signed)
Physical 01/16/15.   Mammogram 11/06/14 - Birads I.  Colonoscopy 01/27/15 - poor prep.  Was to repeat.  Need to see if had repeat colonoscopy.

## 2015-05-24 NOTE — Assessment & Plan Note (Signed)
Mild intermittent asthma.  Followed by Dr Meredeth Ide.  Breathing stable on current regimen.

## 2015-05-24 NOTE — Assessment & Plan Note (Signed)
Low cholesterol diet and exercise.  On simvastatin.  Follow lipid panel and liver function tests.   

## 2015-05-24 NOTE — Assessment & Plan Note (Signed)
Increased fatigue.  No specific pains or complaints.  No cardiac symptoms with increased activity or exertion.  No sob.  Check cbc, met c and tsh.

## 2015-05-24 NOTE — Assessment & Plan Note (Signed)
Sugars have been doing well.  Same medication regimen.  Follow sugars.  Follow metabolic panel and a1c.  Had eye exam 12/2014.

## 2015-05-24 NOTE — Assessment & Plan Note (Signed)
Blood pressure under good control.  Continue same medication regimen.  Follow pressures.  Follow metabolic panel.   

## 2015-05-24 NOTE — Assessment & Plan Note (Signed)
Off gabapentin.  Did not feel it helped.  Desires not to try anything more at this time.  Follow.

## 2015-05-24 NOTE — Assessment & Plan Note (Signed)
No current symptoms.  Will need to follow.  If persistent reoccurring infections, will need urology evaluation.

## 2015-05-29 ENCOUNTER — Other Ambulatory Visit (INDEPENDENT_AMBULATORY_CARE_PROVIDER_SITE_OTHER): Payer: Commercial Managed Care - HMO

## 2015-05-29 DIAGNOSIS — R5383 Other fatigue: Secondary | ICD-10-CM

## 2015-05-29 DIAGNOSIS — E7800 Pure hypercholesterolemia, unspecified: Secondary | ICD-10-CM

## 2015-05-29 DIAGNOSIS — I10 Essential (primary) hypertension: Secondary | ICD-10-CM | POA: Diagnosis not present

## 2015-05-29 DIAGNOSIS — E119 Type 2 diabetes mellitus without complications: Secondary | ICD-10-CM | POA: Diagnosis not present

## 2015-05-29 LAB — LIPID PANEL
CHOLESTEROL: 161 mg/dL (ref 0–200)
HDL: 48.9 mg/dL (ref >39.00)
LDL Cholesterol: 76 mg/dL (ref 0–99)
NONHDL: 112.36
TRIGLYCERIDES: 184 mg/dL — AB (ref 0.0–149.0)
Total CHOL/HDL Ratio: 3
VLDL: 36.8 mg/dL (ref 0.0–40.0)

## 2015-05-29 LAB — CBC WITH DIFFERENTIAL/PLATELET
BASOS PCT: 0.4 % (ref 0.0–3.0)
Basophils Absolute: 0 10*3/uL (ref 0.0–0.1)
EOS ABS: 0.6 10*3/uL (ref 0.0–0.7)
EOS PCT: 6.4 % — AB (ref 0.0–5.0)
HEMATOCRIT: 41 % (ref 36.0–46.0)
HEMOGLOBIN: 13.9 g/dL (ref 12.0–15.0)
Lymphocytes Relative: 27.2 % (ref 12.0–46.0)
Lymphs Abs: 2.6 10*3/uL (ref 0.7–4.0)
MCHC: 33.8 g/dL (ref 30.0–36.0)
MCV: 87 fl (ref 78.0–100.0)
MONOS PCT: 6 % (ref 3.0–12.0)
Monocytes Absolute: 0.6 10*3/uL (ref 0.1–1.0)
Neutro Abs: 5.7 10*3/uL (ref 1.4–7.7)
Neutrophils Relative %: 60 % (ref 43.0–77.0)
Platelets: 258 10*3/uL (ref 150.0–400.0)
RBC: 4.72 Mil/uL (ref 3.87–5.11)
RDW: 13.9 % (ref 11.5–15.5)
WBC: 9.5 10*3/uL (ref 4.0–10.5)

## 2015-05-29 LAB — BASIC METABOLIC PANEL
BUN: 16 mg/dL (ref 6–23)
CHLORIDE: 104 meq/L (ref 96–112)
CO2: 27 meq/L (ref 19–32)
Calcium: 9.8 mg/dL (ref 8.4–10.5)
Creatinine, Ser: 0.91 mg/dL (ref 0.40–1.20)
GFR: 63.23 mL/min (ref >60.00)
Glucose, Bld: 115 mg/dL — ABNORMAL HIGH (ref 70–99)
POTASSIUM: 4.5 meq/L (ref 3.5–5.1)
SODIUM: 139 meq/L (ref 135–145)

## 2015-05-29 LAB — HEPATIC FUNCTION PANEL
ALT: 14 U/L (ref 0–35)
AST: 17 U/L (ref 0–37)
Albumin: 4.5 g/dL (ref 3.5–5.2)
Alkaline Phosphatase: 64 U/L (ref 39–117)
BILIRUBIN DIRECT: 0.1 mg/dL (ref 0.0–0.3)
Total Bilirubin: 0.6 mg/dL (ref 0.2–1.2)
Total Protein: 7.5 g/dL (ref 6.0–8.3)

## 2015-05-29 LAB — VITAMIN B12: Vitamin B-12: 324 pg/mL (ref 211–911)

## 2015-05-29 LAB — HEMOGLOBIN A1C: Hgb A1c MFr Bld: 6.2 % (ref 4.6–6.5)

## 2015-05-29 LAB — TSH: TSH: 2.72 u[IU]/mL (ref 0.35–4.50)

## 2015-06-01 ENCOUNTER — Encounter: Payer: Self-pay | Admitting: *Deleted

## 2015-07-08 ENCOUNTER — Other Ambulatory Visit: Payer: Self-pay | Admitting: Internal Medicine

## 2015-08-30 DIAGNOSIS — N390 Urinary tract infection, site not specified: Secondary | ICD-10-CM | POA: Diagnosis not present

## 2015-08-30 DIAGNOSIS — R300 Dysuria: Secondary | ICD-10-CM | POA: Diagnosis not present

## 2015-09-09 ENCOUNTER — Other Ambulatory Visit: Payer: Self-pay | Admitting: Internal Medicine

## 2015-09-14 ENCOUNTER — Ambulatory Visit (INDEPENDENT_AMBULATORY_CARE_PROVIDER_SITE_OTHER): Payer: Commercial Managed Care - HMO | Admitting: Internal Medicine

## 2015-09-14 ENCOUNTER — Encounter: Payer: Self-pay | Admitting: Internal Medicine

## 2015-09-14 VITALS — BP 120/80 | HR 70 | Temp 98.5°F | Resp 18 | Ht 60.0 in | Wt 128.4 lb

## 2015-09-14 DIAGNOSIS — I10 Essential (primary) hypertension: Secondary | ICD-10-CM | POA: Diagnosis not present

## 2015-09-14 DIAGNOSIS — E7800 Pure hypercholesterolemia, unspecified: Secondary | ICD-10-CM

## 2015-09-14 DIAGNOSIS — Z1239 Encounter for other screening for malignant neoplasm of breast: Secondary | ICD-10-CM | POA: Diagnosis not present

## 2015-09-14 DIAGNOSIS — G629 Polyneuropathy, unspecified: Secondary | ICD-10-CM

## 2015-09-14 DIAGNOSIS — E119 Type 2 diabetes mellitus without complications: Secondary | ICD-10-CM

## 2015-09-14 DIAGNOSIS — J4520 Mild intermittent asthma, uncomplicated: Secondary | ICD-10-CM | POA: Diagnosis not present

## 2015-09-14 NOTE — Progress Notes (Signed)
[name], female   DOB: [birth year], 81 y.o.   MRN: [id number]   Subjective:    [name], female    DOB: 1933-06-30, 81 y.o.   MRN: [id number]  HPI  Patient with past history of hypercholesterolemia, diabetes and hypertension who comes in today to follow up on these issues.  She is accompanied by her daughter.  History obtained from both of them.  Breathing is stable.   No increased cough or congestion.  Sees Dr Meredeth Ide.  No acid reflux reported.  No abdominal pain or cramping.  Bowels stable.  Sugars doing well.  States recent sugars 113, 116.     Past Medical History  Diagnosis Date  . Reactive airway disease   . Hypertension   . Hypercholesterolemia   . Osteopenia   . Diabetes mellitus [facility])    Past Surgical History  Procedure Laterality Date  . Abdominal hysterectomy  1981    prolapse and bleeding, ovaries not removed  . Umbilical hernia repair  7/94   Family History  Problem Relation Age of Onset  . Heart disease Father   . Arthritis Mother   . Heart disease Mother   . Throat cancer Sister    Social History   Social History  . Marital Status: Widowed    Spouse Name: N/A  . Number of Children: 3  . Years of Education: N/A   Social History Main Topics  . Smoking status: Never Smoker   . Smokeless tobacco: Never Used  . Alcohol Use: No  . Drug Use: No  . Sexual Activity: No   Other Topics Concern  . None   Social History Narrative    Outpatient Encounter Prescriptions as of 09/14/2015  Medication Sig  . ADVAIR DISKUS 250-50 MCG/DOSE AEPB INHALE ONE DOSE BY MOUTH EVERY 12 HOURS. RINSE MOUTH AFTER EACH USE  . aspirin 81 MG tablet Take 81 mg by mouth daily.  . calcium carbonate (OS-CAL) 600 MG TABS Take 600 mg by mouth daily.  . fluticasone (FLONASE) 50 MCG/ACT nasal spray USE TWO SPRAYS IN EACH NOSTRIL ONCE DAILY  . lisinopril-hydrochlorothiazide (PRINZIDE,ZESTORETIC) 20-12.5 MG tablet TAKE ONE TABLET BY MOUTH ONCE  DAILY  . metFORMIN (GLUCOPHAGE) 500 MG tablet TAKE ONE TABLET BY MOUTH TWICE DAILY WITH A MEAL  . Multiple Vitamins-Minerals (VISION FORMULA/LUTEIN PO) Take by mouth daily.  . simvastatin (ZOCOR) 10 MG tablet TAKE ONE TABLET BY MOUTH AT BEDTIME   No facility-administered encounter medications on file as of 09/14/2015.    Review of Systems  Constitutional: Positive for fatigue. Negative for appetite change and unexpected weight change.  HENT: Negative for congestion and sinus pressure.   Respiratory: Negative for cough, chest tightness and shortness of breath.   Cardiovascular: Negative for chest pain, palpitations and leg swelling.  Gastrointestinal: Negative for nausea, vomiting, abdominal pain and diarrhea.  Genitourinary: Negative for dysuria and difficulty urinating.  Musculoskeletal: Negative for back pain and joint swelling.  Skin: Negative for color change and rash.  Neurological: Negative for dizziness, light-headedness and headaches.  Psychiatric/Behavioral: Negative for dysphoric mood and agitation.       Objective:     Blood pressure rechecked by me:  122/68  Physical Exam  Constitutional: She appears well-developed and well-nourished. No distress.  HENT:  Nose: Nose normal.  Mouth/Throat: Oropharynx is clear and moist.  Eyes: Conjunctivae are normal. Right eye exhibits no discharge. Left eye exhibits no discharge.  Neck: Neck supple. No thyromegaly present.  Cardiovascular:  Normal rate and regular rhythm.   Pulmonary/Chest: Breath sounds normal. No respiratory distress. She has no wheezes.  Abdominal: Soft. Bowel sounds are normal. There is no tenderness.  Musculoskeletal: She exhibits no edema or tenderness.  Lymphadenopathy:    She has no cervical adenopathy.  Skin: No rash noted. No erythema.  Psychiatric: She has a normal mood and affect. Her behavior is normal.    BP 120/80 mmHg  Pulse 70  Temp(Src) 98.5 F (36.9 C) (Oral)  Resp 18  Ht 5' (1.524 m)   Wt 128 lb 6 oz (58.231 kg)  BMI 25.07 kg/m2  SpO2 96% Wt Readings from Last 3 Encounters:  09/14/15 128 lb 6 oz (58.231 kg)  05/22/15 130 lb 6 oz (59.138 kg)  01/16/15 132 lb 9.6 oz (60.147 kg)     Lab Results  Component Value Date   WBC 9.5 05/29/2015   HGB 13.9 05/29/2015   HCT 41.0 05/29/2015   PLT 258.0 05/29/2015   GLUCOSE 115* 05/29/2015   CHOL 161 05/29/2015   TRIG 184.0* 05/29/2015   HDL 48.90 05/29/2015   LDLDIRECT 124.2 07/22/2014   LDLCALC 76 05/29/2015   ALT 14 05/29/2015   AST 17 05/29/2015   NA 139 05/29/2015   K 4.5 05/29/2015   CL 104 05/29/2015   CREATININE 0.91 05/29/2015   BUN 16 05/29/2015   CO2 27 05/29/2015   TSH 2.72 05/29/2015   HGBA1C 6.2 05/29/2015   MICROALBUR 2.3* 07/22/2014       Assessment & Plan:   Problem List Items Addressed This Visit    Diabetes mellitus (HCC)    Sugars have been doing well.  Same medication regimen.  Follow sugars   Follow metabolic panel and a1c.  Eye exam 12/2014.        Relevant Orders   Hemoglobin A1c   Microalbumin / creatinine urine ratio   Hypercholesterolemia    Low cholesterol diet and exercise.  Follow lipid panel and liver function tests.  On simvastatin.        Relevant Orders   Lipid panel   Hepatic function panel   Hypertension    Blood pressure under good control.  Continue same medication regimen.  Follow pressures.  Follow metabolic panel.        Relevant Orders   Basic metabolic panel   Neuropathy (HCC)    Off gabapentin.  Did not feel it helped.  Follow.        Reactive airway disease    Followed by Dr Meredeth Ide.  Breathing stable on current regimen.         Other Visit Diagnoses    Screening breast examination    -  Primary    Relevant Orders    MM DIGITAL SCREENING BILATERAL        Dale Galena, MD

## 2015-09-14 NOTE — Progress Notes (Signed)
Pre-visit discussion using our clinic review tool. No additional management support is needed unless otherwise documented below in the visit note.  

## 2015-09-20 ENCOUNTER — Encounter: Payer: Self-pay | Admitting: Internal Medicine

## 2015-09-20 NOTE — Assessment & Plan Note (Signed)
Low cholesterol diet and exercise.  Follow lipid panel and liver function tests.  On simvastatin.   

## 2015-09-20 NOTE — Assessment & Plan Note (Signed)
Blood pressure under good control.  Continue same medication regimen.  Follow pressures.  Follow metabolic panel.   

## 2015-09-20 NOTE — Assessment & Plan Note (Signed)
Followed by Dr Meredeth Ide.  Breathing stable on current regimen.

## 2015-09-20 NOTE — Assessment & Plan Note (Signed)
Off gabapentin.  Did not feel it helped.  Follow.

## 2015-09-20 NOTE — Assessment & Plan Note (Signed)
Sugars have been doing well.  Same medication regimen.  Follow sugars   Follow metabolic panel and a1c.  Eye exam 12/2014.

## 2015-09-23 ENCOUNTER — Other Ambulatory Visit: Payer: Self-pay | Admitting: Internal Medicine

## 2015-09-23 ENCOUNTER — Ambulatory Visit: Payer: Commercial Managed Care - HMO | Admitting: Internal Medicine

## 2015-10-05 ENCOUNTER — Ambulatory Visit: Payer: Self-pay | Admitting: Internal Medicine

## 2015-11-04 ENCOUNTER — Other Ambulatory Visit: Payer: Self-pay | Admitting: Internal Medicine

## 2015-11-09 ENCOUNTER — Ambulatory Visit: Payer: Commercial Managed Care - HMO

## 2015-11-18 ENCOUNTER — Other Ambulatory Visit: Payer: Self-pay | Admitting: Internal Medicine

## 2015-11-18 ENCOUNTER — Ambulatory Visit
Admission: RE | Admit: 2015-11-18 | Discharge: 2015-11-18 | Disposition: A | Discharge status: Home / Self Care | Payer: Commercial Managed Care - HMO | Source: Ambulatory Visit | Attending: Internal Medicine | Admitting: Internal Medicine

## 2015-11-18 DIAGNOSIS — Z1239 Encounter for other screening for malignant neoplasm of breast: Secondary | ICD-10-CM

## 2015-11-18 DIAGNOSIS — Z1231 Encounter for screening mammogram for malignant neoplasm of breast: Secondary | ICD-10-CM | POA: Insufficient documentation

## 2015-11-26 ENCOUNTER — Other Ambulatory Visit: Payer: Self-pay | Admitting: Internal Medicine

## 2015-11-30 DIAGNOSIS — J4520 Mild intermittent asthma, uncomplicated: Secondary | ICD-10-CM | POA: Diagnosis not present

## 2015-12-25 ENCOUNTER — Ambulatory Visit (INDEPENDENT_AMBULATORY_CARE_PROVIDER_SITE_OTHER): Payer: Commercial Managed Care - HMO

## 2015-12-25 VITALS — BP 118/72 | HR 92 | Temp 98.1°F | Resp 14 | Ht 60.0 in | Wt 127.1 lb

## 2015-12-25 DIAGNOSIS — Z0000 Encounter for general adult medical examination without abnormal findings: Secondary | ICD-10-CM | POA: Diagnosis not present

## 2015-12-25 NOTE — Progress Notes (Signed)
Subjective:   Tanya Cole is a 82 y.o. female who presents for an Initial Medicare Annual Wellness Visit.  Review of Systems    No ROS.  Medicare Wellness Visit.  Cardiac Risk Factors include: advanced age (>34men, >78 women);hypertension     Objective:    Today's Vitals   12/25/15 1259  BP: 118/72  Pulse: 92  Temp: 98.1 F (36.7 C)  TempSrc: Oral  Resp: 14  Height: 5' (1.524 m)  Weight: 127 lb 1.9 oz (57.661 kg)  SpO2: 95%    Current Medications (verified) Outpatient Encounter Prescriptions as of 12/25/2015  Medication Sig  . ADVAIR DISKUS 250-50 MCG/DOSE AEPB INHALE ONE DOSE BY MOUTH EVERY 12 HOURS. RINSE MOUTH AFTER EACH USE  . aspirin 81 MG tablet Take 81 mg by mouth daily.  . calcium carbonate (OS-CAL) 600 MG TABS Take 600 mg by mouth daily.  . fluticasone (FLONASE) 50 MCG/ACT nasal spray USE TWO SPRAYS IN EACH NOSTRIL ONCE DAILY  . lisinopril-hydrochlorothiazide (PRINZIDE,ZESTORETIC) 20-12.5 MG tablet TAKE ONE TABLET BY MOUTH ONCE DAILY  . metFORMIN (GLUCOPHAGE) 500 MG tablet TAKE ONE TABLET BY MOUTH TWICE DAILY WITH A MEAL  . Multiple Vitamins-Minerals (VISION FORMULA/LUTEIN PO) Take by mouth daily.  . simvastatin (ZOCOR) 10 MG tablet TAKE ONE TABLET BY MOUTH AT BEDTIME   No facility-administered encounter medications on file as of 12/25/2015.    Allergies (verified) No known drug allergy   History: Past Medical History  Diagnosis Date  . Reactive airway disease   . Hypertension   . Hypercholesterolemia   . Osteopenia   . Diabetes mellitus [facility])    Past Surgical History  Procedure Laterality Date  . Abdominal hysterectomy  1981    prolapse and bleeding, ovaries not removed  . Umbilical hernia repair  7/94  . Breast excisional biopsy Right    Family History  Problem Relation Age of Onset  . Heart disease Father   . Arthritis Mother   . Heart disease Mother   . Throat cancer Sister    Social History   Occupational History  . Not on  file.   Social History Main Topics  . Smoking status: Never Smoker   . Smokeless tobacco: Never Used  . Alcohol Use: No  . Drug Use: No  . Sexual Activity: No    Tobacco Counseling Counseling given: Not Answered   Activities of Daily Living In your present state of health, do you have any difficulty performing the following activities: 12/25/2015 01/16/2015  Hearing? N N  Vision? N N  Difficulty concentrating or making decisions? N N  Walking or climbing stairs? N N  Dressing or bathing? N N  Doing errands, shopping? Y N  Preparing Food and eating ? N -  Using the Toilet? N -  In the past six months, have you accidently leaked urine? N -  Do you have problems with loss of bowel control? N -  Managing your Medications? N -  Managing your Finances? N -  Housekeeping or managing your Housekeeping? N -    Immunizations and Health Maintenance Immunization History  Administered Date(s) Administered  . Pneumococcal Conjugate-13 07/22/2014   Health Maintenance Due  Topic Date Due  . TETANUS/TDAP  06/16/1954  . ZOSTAVAX  06/17/1995  . DEXA SCAN  06/16/2000  . PNA vac Low Risk Adult (2 of 2 - PPSV23) 07/23/2015  . HEMOGLOBIN A1C  11/29/2015    Patient Care Team: Dale [place]ille, MD as PCP - General (Internal Medicine)  Indicate any recent Medical Services you may have received from other than [facility] providers in the past year (date may be approximate).     Assessment:   This is a routine wellness examination for Tanya Cole. The goal of the wellness visit is to assist the patient how to close the gaps in care and create a preventative care plan for the patient.   Taking Calcium as appropriate/Osteoporosis risk reviewed.  DEXA Scan postponed per patient request.  Educational material provided.  Medications reviewed; taking without issues or barriers.  Safety issues reviewed; smoke detectors in the home. No firearms in the home. Wears seatbelts when driving or riding with  others. No violence in the home.  No identified risk were noted; The patient was oriented x 3; appropriate in dress and manner and no objective failures at ADL's or IADL's.  TDAP and Zostavax vaccine postponed for follow up with insurance.   Pneumococcal 23 postponed, per patient request to wait for upcoming visit.  DMII wo cmp nt st u-stable and followed by PCP Type 2 diabetes mell-stable and followed by PCP   Patient Concerns: None at this time.  Follow up as needed.   Hearing/Vision screen Hearing Screening Comments: Passes the whisper test Vision Screening Comments: Followed by [facility] physician [address] Wears glasses Annual visits   Dietary issues and exercise activities discussed: Current Exercise Habits:: The patient does not participate in regular exercise at present  Goals    . Healthy Lifestyle     Stay hydrated and drink plenty of fluids Choose low carb foods, fruits and vegetables Start chair exercises as demonstrated, as tolerated      Depression Screen PHQ 2/9 Scores 12/25/2015 01/16/2015 11/15/2013 01/13/2013  PHQ - 2 Score 0 0 0 0    Fall Risk Fall Risk  12/25/2015 01/16/2015 11/15/2013 01/13/2013  Falls in the past year? No No No No  Risk for fall due to : - Impaired balance/gait - -    Cognitive Function: MMSE - Mini Mental State Exam 12/25/2015  Orientation to time 5  Orientation to Place 5  Registration 3  Attention/ Calculation 5  Recall 3  Language- name 2 objects 2  Language- repeat 1  Language- follow 3 step command 3  Language- read & follow direction 1  Write a sentence 1  Copy design 1  Total score 30    Screening Tests Health Maintenance  Topic Date Due  . TETANUS/TDAP  06/16/1954  . ZOSTAVAX  06/17/1995  . DEXA SCAN  06/16/2000  . PNA vac Low Risk Adult (2 of 2 - PPSV23) 07/23/2015  . HEMOGLOBIN A1C  11/29/2015  . INFLUENZA VACCINE  01/29/2016 (Originally 06/01/2015)  . OPHTHALMOLOGY EXAM  01/06/2016  . FOOT EXAM   05/21/2016  . MAMMOGRAM  11/17/2016      Plan:    End of life planning; Advance aging; Advanced directives discussed. Copy of current HCPOA/Living Will requested  During the course of the visit, Tanya Cole was educated and counseled about the following appropriate screening and preventive services:   Vaccines to include Pneumoccal, Influenza, Hepatitis B, Td, Zostavax, HCV  Electrocardiogram  Cardiovascular disease screening  Colorectal cancer screening  Bone density screening  Diabetes screening  Glaucoma screening  Mammography/PAP  Nutrition counseling  Smoking cessation counseling  Patient Instructions (the written plan) were given to the patient.    Ashok Pall, LPN   8/92/1194    Reviewed above information.  Agree with plan.   Dr Lorin Picket

## 2015-12-25 NOTE — Patient Instructions (Addendum)
Tanya Cole,  Thank you for taking time to come for your Medicare Wellness Visit. I appreciate your ongoing commitment to your health goals. Please review the following plan we discussed and let me know if I can assist you in the future.     This is a list of the screening recommended for you and due dates:  Health Maintenance  Topic Date Due  . Tetanus Vaccine  06/16/1954  . Shingles Vaccine  06/17/1995  . DEXA scan (bone density measurement)  06/16/2000  . Pneumonia vaccines (2 of 2 - PPSV23) 07/23/2015  . Hemoglobin A1C  11/29/2015  . Flu Shot  01/29/2016*  . Eye exam for diabetics  01/06/2016  . Complete foot exam   05/21/2016  . Mammogram  11/17/2016  *Topic was postponed. The date shown is not the original due date.    Bone Densitometry Bone densitometry is an imaging test that uses a special X-ray to measure the amount of calcium and other minerals in your bones (bone density). This test is also known as a bone mineral density test or dual-energy X-ray absorptiometry (DXA). The test can measure bone density at your hip and your spine. It is similar to having a regular X-ray. You may have this test to:  Diagnose a condition that causes weak or thin bones (osteoporosis).  Predict your risk of a broken bone (fracture).  Determine how well osteoporosis treatment is working. LET [facility] CARE PROVIDER KNOW ABOUT:  Any allergies you have.  All medicines you are taking, including vitamins, herbs, eye drops, creams, and over-the-counter medicines.  Previous problems you or members of your family have had with the use of anesthetics.  Any blood disorders you have.  Previous surgeries you have had.  Medical conditions you have.  Possibility of pregnancy.  Any other medical test you had within the previous 14 days that used contrast material. RISKS AND COMPLICATIONS Generally, this is a safe procedure. However, problems can occur and may include the following:  This test  exposes you to a very small amount of radiation.  The risks of radiation exposure may be greater to unborn children. BEFORE THE PROCEDURE  Do not take any calcium supplements for 24 hours before having the test. You can otherwise eat and drink what you usually do.  Take off all metal jewelry, eyeglasses, dental appliances, and any other metal objects. PROCEDURE  You may lie on an exam table. There will be an X-ray generator below you and an imaging device above you.  Other devices, such as boxes or braces, may be used to position your body properly for the scan.  You will need to lie still while the machine slowly scans your body.  The images will show up on a computer monitor. AFTER THE PROCEDURE You may need more testing at a later time.   This information is not intended to replace advice given to you by your health care provider. Make sure you discuss any questions you have with your health care provider.   Document Released: 11/08/2004 Document Revised: 11/07/2014 Document Reviewed: 03/27/2014 Elsevier Interactive Patient Education [employer]

## 2016-01-19 ENCOUNTER — Other Ambulatory Visit (INDEPENDENT_AMBULATORY_CARE_PROVIDER_SITE_OTHER): Payer: Commercial Managed Care - HMO

## 2016-01-19 DIAGNOSIS — E7800 Pure hypercholesterolemia, unspecified: Secondary | ICD-10-CM

## 2016-01-19 DIAGNOSIS — I10 Essential (primary) hypertension: Secondary | ICD-10-CM | POA: Diagnosis not present

## 2016-01-19 DIAGNOSIS — E119 Type 2 diabetes mellitus without complications: Secondary | ICD-10-CM

## 2016-01-19 LAB — HEPATIC FUNCTION PANEL
ALBUMIN: 4.4 g/dL (ref 3.5–5.2)
ALT: 13 U/L (ref 0–35)
AST: 15 U/L (ref 0–37)
Alkaline Phosphatase: 56 U/L (ref 39–117)
BILIRUBIN TOTAL: 0.5 mg/dL (ref 0.2–1.2)
Bilirubin, Direct: 0.1 mg/dL (ref 0.0–0.3)
Total Protein: 7.4 g/dL (ref 6.0–8.3)

## 2016-01-19 LAB — LIPID PANEL
CHOL/HDL RATIO: 4
Cholesterol: 162 mg/dL (ref 0–200)
HDL: 45 mg/dL (ref >39.00)
LDL CALC: 79 mg/dL (ref 0–99)
NONHDL: 117.25
Triglycerides: 192 mg/dL — ABNORMAL HIGH (ref 0.0–149.0)
VLDL: 38.4 mg/dL (ref 0.0–40.0)

## 2016-01-19 LAB — BASIC METABOLIC PANEL
BUN: 19 mg/dL (ref 6–23)
CO2: 28 meq/L (ref 19–32)
Calcium: 10.1 mg/dL (ref 8.4–10.5)
Chloride: 103 mEq/L (ref 96–112)
Creatinine, Ser: 0.85 mg/dL (ref 0.40–1.20)
GFR: 68.3 mL/min (ref >60.00)
GLUCOSE: 136 mg/dL — AB (ref 70–99)
POTASSIUM: 4.2 meq/L (ref 3.5–5.1)
SODIUM: 140 meq/L (ref 135–145)

## 2016-01-19 LAB — HEMOGLOBIN A1C: Hgb A1c MFr Bld: 6.4 % (ref 4.6–6.5)

## 2016-01-22 ENCOUNTER — Encounter: Payer: Self-pay | Admitting: Internal Medicine

## 2016-01-22 ENCOUNTER — Ambulatory Visit (INDEPENDENT_AMBULATORY_CARE_PROVIDER_SITE_OTHER): Payer: Commercial Managed Care - HMO | Admitting: Internal Medicine

## 2016-01-22 ENCOUNTER — Other Ambulatory Visit: Payer: Commercial Managed Care - HMO

## 2016-01-22 VITALS — BP 120/70 | HR 81 | Temp 98.4°F | Resp 18 | Ht 60.5 in | Wt 124.8 lb

## 2016-01-22 DIAGNOSIS — I10 Essential (primary) hypertension: Secondary | ICD-10-CM

## 2016-01-22 DIAGNOSIS — Z0000 Encounter for general adult medical examination without abnormal findings: Secondary | ICD-10-CM | POA: Diagnosis not present

## 2016-01-22 DIAGNOSIS — J4520 Mild intermittent asthma, uncomplicated: Secondary | ICD-10-CM | POA: Diagnosis not present

## 2016-01-22 DIAGNOSIS — E119 Type 2 diabetes mellitus without complications: Secondary | ICD-10-CM

## 2016-01-22 DIAGNOSIS — G629 Polyneuropathy, unspecified: Secondary | ICD-10-CM

## 2016-01-22 DIAGNOSIS — E7800 Pure hypercholesterolemia, unspecified: Secondary | ICD-10-CM

## 2016-01-22 LAB — MICROALBUMIN / CREATININE URINE RATIO
CREATININE, U: 78.1 mg/dL
MICROALB/CREAT RATIO: 0.9 mg/g (ref 0.0–30.0)
Microalb, Ur: <0.7 mg/dL (ref 0.0–1.9)

## 2016-01-22 NOTE — Assessment & Plan Note (Signed)
Physical today.  Mammogram 11/18/15 - Birads I.  Declines colonoscopy.  Agreed to cologuard.

## 2016-01-22 NOTE — Progress Notes (Signed)
[name], female   DOB: 1933/10/14, 82 y.o.   MRN: [id number]   Subjective:    [name], female    DOB: [birth year], 82 y.o.   MRN: [id number]  HPI  Patient here for her physical exam.  She is doing well.  Feels good.  Increased stress recently with family medical issues.  Feels she is handling things relatively well.  Tries to stay active.  No cardiac symptoms with increased activity or exertion.  No sob.  No acid reflux.  No abdominal pain or cramping.  Bowels stable.  Discussed colonoscopy.  She declines.  Discussed cologuard.  Blood sugars averaging 112/114.     Past Medical History  Diagnosis Date  . Reactive airway disease   . Hypertension   . Hypercholesterolemia   . Osteopenia   . Diabetes mellitus [facility])    Past Surgical History  Procedure Laterality Date  . Abdominal hysterectomy  1981    prolapse and bleeding, ovaries not removed  . Umbilical hernia repair  7/94  . Breast excisional biopsy Right    Family History  Problem Relation Age of Onset  . Heart disease Father   . Arthritis Mother   . Heart disease Mother   . Throat cancer Sister    Social History   Social History  . Marital Status: Widowed    Spouse Name: N/A  . Number of Children: 3  . Years of Education: N/A   Social History Main Topics  . Smoking status: Never Smoker   . Smokeless tobacco: Never Used  . Alcohol Use: No  . Drug Use: No  . Sexual Activity: No   Other Topics Concern  . None   Social History Narrative    Outpatient Encounter Prescriptions as of 01/22/2016  Medication Sig  . ADVAIR DISKUS 250-50 MCG/DOSE AEPB INHALE ONE DOSE BY MOUTH EVERY 12 HOURS. RINSE MOUTH AFTER EACH USE  . aspirin 81 MG tablet Take 81 mg by mouth daily.  . calcium carbonate (OS-CAL) 600 MG TABS Take 600 mg by mouth daily.  . fluticasone (FLONASE) 50 MCG/ACT nasal spray USE TWO SPRAYS IN EACH NOSTRIL ONCE DAILY  . lisinopril-hydrochlorothiazide  (PRINZIDE,ZESTORETIC) 20-12.5 MG tablet TAKE ONE TABLET BY MOUTH ONCE DAILY  . metFORMIN (GLUCOPHAGE) 500 MG tablet TAKE ONE TABLET BY MOUTH TWICE DAILY WITH A MEAL  . Multiple Vitamins-Minerals (VISION FORMULA/LUTEIN PO) Take by mouth daily.  . simvastatin (ZOCOR) 10 MG tablet TAKE ONE TABLET BY MOUTH AT BEDTIME   No facility-administered encounter medications on file as of 01/22/2016.    Review of Systems  Constitutional: Negative for appetite change and unexpected weight change.  HENT: Negative for congestion and sinus pressure.   Eyes: Negative for pain and visual disturbance.  Respiratory: Negative for cough, chest tightness and shortness of breath.   Cardiovascular: Negative for chest pain, palpitations and leg swelling.  Gastrointestinal: Negative for nausea, vomiting, abdominal pain and diarrhea.  Genitourinary: Negative for dysuria and difficulty urinating.  Musculoskeletal: Negative for back pain and joint swelling.  Skin: Negative for color change and rash.  Neurological: Negative for dizziness, light-headedness and headaches.  Hematological: Negative for adenopathy. Does not bruise/bleed easily.  Psychiatric/Behavioral: Negative for dysphoric mood and agitation.       Objective:    Physical Exam  Constitutional: She is oriented to person, place, and time. She appears well-developed and well-nourished. No distress.  HENT:  Nose: Nose normal.  Mouth/Throat: Oropharynx is clear and moist.  Eyes:  Right eye exhibits no discharge. Left eye exhibits no discharge. No scleral icterus.  Neck: Neck supple. No thyromegaly present.  Cardiovascular: Normal rate and regular rhythm.   Pulmonary/Chest: Breath sounds normal. No accessory muscle usage. No tachypnea. No respiratory distress. She has no decreased breath sounds. She has no wheezes. She has no rhonchi. Right breast exhibits no inverted nipple, no mass, no nipple discharge and no tenderness (no axillary adenopathy). Left breast  exhibits no inverted nipple, no mass, no nipple discharge and no tenderness (no axilarry adenopathy).  Abdominal: Soft. Bowel sounds are normal. There is no tenderness.  Musculoskeletal: She exhibits no edema or tenderness.  Lymphadenopathy:    She has no cervical adenopathy.  Neurological: She is alert and oriented to person, place, and time.  Skin: Skin is warm. No rash noted. No erythema.  Psychiatric: She has a normal mood and affect. Her behavior is normal.    BP 120/70 mmHg  Pulse 81  Temp(Src) 98.4 F (36.9 C) (Oral)  Resp 18  Ht 5' 0.5" (1.537 m)  Wt 124 lb 12 oz (56.586 kg)  BMI 23.95 kg/m2  SpO2 92% Wt Readings from Last 3 Encounters:  01/22/16 124 lb 12 oz (56.586 kg)  12/25/15 127 lb 1.9 oz (57.661 kg)  09/14/15 128 lb 6 oz (58.231 kg)     Lab Results  Component Value Date   WBC 9.5 05/29/2015   HGB 13.9 05/29/2015   HCT 41.0 05/29/2015   PLT 258.0 05/29/2015   GLUCOSE 136* 01/19/2016   CHOL 162 01/19/2016   TRIG 192.0* 01/19/2016   HDL 45.00 01/19/2016   LDLDIRECT 124.2 07/22/2014   LDLCALC 79 01/19/2016   ALT 13 01/19/2016   AST 15 01/19/2016   NA 140 01/19/2016   K 4.2 01/19/2016   CL 103 01/19/2016   CREATININE 0.85 01/19/2016   BUN 19 01/19/2016   CO2 28 01/19/2016   TSH 2.72 05/29/2015   HGBA1C 6.4 01/19/2016   MICROALBUR <0.7 01/22/2016    Mm Screening Breast Tomo Bilateral  11/18/2015  CLINICAL DATA:  Screening. EXAM: DIGITAL SCREENING BILATERAL MAMMOGRAM WITH 3D TOMO WITH CAD COMPARISON:  Previous exam(s). ACR Breast Density Category b: There are scattered areas of fibroglandular density. FINDINGS: There are no findings suspicious for malignancy. Images were processed with CAD. IMPRESSION: No mammographic evidence of malignancy. A result letter of this screening mammogram will be mailed directly to the patient. RECOMMENDATION: Screening mammogram in one year. (Code:SM-B-01Y) BI-RADS CATEGORY  1: Negative. Electronically Signed   By: Ammie Ferrier M.D.   On: 11/18/2015 10:11       Assessment & Plan:   Problem List Items Addressed This Visit    Diabetes mellitus ([place])    Sugars doing well.  Continue diet and exercise.  Follow met b and a1c.       Relevant Orders   Hemoglobin A1c   Health care maintenance    Physical today.  Mammogram 11/18/15 - Birads I.  Declines colonoscopy.  Agreed to cologuard.        Hypercholesterolemia    On simvastatin.  Low cholesterol diet and exercise.  Follow lipid panel and liver function tests.        Relevant Orders   Lipid panel   Hepatic function panel   Hypertension    Blood pressure under good control.  Continue same medication regimen.  Follow pressures.  Follow metabolic panel.        Relevant Orders   CBC with Differential/Platelet  TSH   Basic metabolic panel   Neuropathy (HCC)    Stable.  Off gabapentin.        Reactive airway disease    Followed by Dr Raul Del.  Breathing stable.  Continue current regimen.         Other Visit Diagnoses    Routine general medical examination at a health care facility    -  Primary        Einar Pheasant, MD

## 2016-01-22 NOTE — Progress Notes (Signed)
Pre-visit discussion using our clinic review tool. No additional management support is needed unless otherwise documented below in the visit note.  

## 2016-01-24 ENCOUNTER — Encounter: Payer: Self-pay | Admitting: Internal Medicine

## 2016-01-24 NOTE — Assessment & Plan Note (Signed)
On simvastatin.  Low cholesterol diet and exercise.  Follow lipid panel and liver function tests.   

## 2016-01-24 NOTE — Assessment & Plan Note (Signed)
Sugars doing well.  Continue diet and exercise.  Follow met b and a1c.

## 2016-01-24 NOTE — Assessment & Plan Note (Signed)
Stable.  Off gabapentin.

## 2016-01-24 NOTE — Assessment & Plan Note (Signed)
Followed by Dr Fleming.  Breathing stable.  Continue current regimen.  

## 2016-01-24 NOTE — Assessment & Plan Note (Signed)
Blood pressure under good control.  Continue same medication regimen.  Follow pressures.  Follow metabolic panel.   

## 2016-01-28 LAB — HM DIABETES EYE EXAM

## 2016-02-09 DIAGNOSIS — Z1211 Encounter for screening for malignant neoplasm of colon: Secondary | ICD-10-CM | POA: Diagnosis not present

## 2016-02-09 DIAGNOSIS — Z1212 Encounter for screening for malignant neoplasm of rectum: Secondary | ICD-10-CM | POA: Diagnosis not present

## 2016-02-09 LAB — COLOGUARD: COLOGUARD: NEGATIVE

## 2016-02-17 ENCOUNTER — Other Ambulatory Visit: Payer: Self-pay | Admitting: Internal Medicine

## 2016-02-24 ENCOUNTER — Other Ambulatory Visit: Payer: Self-pay | Admitting: Internal Medicine

## 2016-02-25 ENCOUNTER — Encounter: Payer: Self-pay | Admitting: Internal Medicine

## 2016-02-29 ENCOUNTER — Encounter: Payer: Self-pay | Admitting: *Deleted

## 2016-03-24 ENCOUNTER — Other Ambulatory Visit: Payer: Self-pay | Admitting: Internal Medicine

## 2016-04-24 ENCOUNTER — Emergency Department: Payer: Commercial Managed Care - HMO

## 2016-04-24 ENCOUNTER — Emergency Department
Admission: EM | Admit: 2016-04-24 | Discharge: 2016-04-24 | Disposition: A | Discharge status: Home / Self Care | Payer: Commercial Managed Care - HMO | Attending: Emergency Medicine | Admitting: Emergency Medicine

## 2016-04-24 ENCOUNTER — Encounter: Payer: Self-pay | Admitting: Emergency Medicine

## 2016-04-24 DIAGNOSIS — J45909 Unspecified asthma, uncomplicated: Secondary | ICD-10-CM | POA: Insufficient documentation

## 2016-04-24 DIAGNOSIS — Z79899 Other long term (current) drug therapy: Secondary | ICD-10-CM | POA: Diagnosis not present

## 2016-04-24 DIAGNOSIS — Z7982 Long term (current) use of aspirin: Secondary | ICD-10-CM | POA: Diagnosis not present

## 2016-04-24 DIAGNOSIS — I10 Essential (primary) hypertension: Secondary | ICD-10-CM | POA: Diagnosis not present

## 2016-04-24 DIAGNOSIS — Z7984 Long term (current) use of oral hypoglycemic drugs: Secondary | ICD-10-CM | POA: Insufficient documentation

## 2016-04-24 DIAGNOSIS — E119 Type 2 diabetes mellitus without complications: Secondary | ICD-10-CM | POA: Diagnosis not present

## 2016-04-24 DIAGNOSIS — M549 Dorsalgia, unspecified: Secondary | ICD-10-CM

## 2016-04-24 DIAGNOSIS — M545 Low back pain: Secondary | ICD-10-CM | POA: Diagnosis not present

## 2016-04-24 DIAGNOSIS — M5136 Other intervertebral disc degeneration, lumbar region: Secondary | ICD-10-CM | POA: Diagnosis not present

## 2016-04-24 LAB — URINALYSIS COMPLETE WITH MICROSCOPIC (ARMC ONLY)
BACTERIA UA: NONE SEEN
BILIRUBIN URINE: NEGATIVE
GLUCOSE, UA: NEGATIVE mg/dL
Hgb urine dipstick: NEGATIVE
Ketones, ur: NEGATIVE mg/dL
Nitrite: NEGATIVE
Protein, ur: NEGATIVE mg/dL
RBC / HPF: NONE SEEN RBC/hpf (ref 0–5)
Specific Gravity, Urine: 1.011 (ref 1.005–1.030)
Squamous Epithelial / LPF: NONE SEEN
WBC UA: NONE SEEN WBC/hpf (ref 0–5)
pH: 5 (ref 5.0–8.0)

## 2016-04-24 LAB — BASIC METABOLIC PANEL
ANION GAP: 14 (ref 5–15)
BUN: 21 mg/dL — ABNORMAL HIGH (ref 6–20)
CHLORIDE: 101 mmol/L (ref 101–111)
CO2: 24 mmol/L (ref 22–32)
CREATININE: 0.79 mg/dL (ref 0.44–1.00)
Calcium: 9.7 mg/dL (ref 8.9–10.3)
GFR calc non Af Amer: >60 mL/min (ref >60)
Glucose, Bld: 110 mg/dL — ABNORMAL HIGH (ref 65–99)
POTASSIUM: 4.3 mmol/L (ref 3.5–5.1)
SODIUM: 139 mmol/L (ref 135–145)

## 2016-04-24 LAB — CBC WITH DIFFERENTIAL/PLATELET
Basophils Absolute: 0.1 10*3/uL (ref 0–0.1)
Basophils Relative: 1 %
EOS ABS: 0.3 10*3/uL (ref 0–0.7)
Eosinophils Relative: 2 %
HEMATOCRIT: 36.9 % (ref 35.0–47.0)
HEMOGLOBIN: 12.5 g/dL (ref 12.0–16.0)
LYMPHS ABS: 1.4 10*3/uL (ref 1.0–3.6)
MCH: 28.8 pg (ref 26.0–34.0)
MCHC: 33.8 g/dL (ref 32.0–36.0)
MCV: 85 fL (ref 80.0–100.0)
Monocytes Absolute: 0.8 10*3/uL (ref 0.2–0.9)
Monocytes Relative: 6 %
NEUTROS ABS: 10.4 10*3/uL — AB (ref 1.4–6.5)
Platelets: 274 10*3/uL (ref 150–440)
RBC: 4.34 MIL/uL (ref 3.80–5.20)
RDW: 13.6 % (ref 11.5–14.5)
WBC: 13 10*3/uL — AB (ref 3.6–11.0)

## 2016-04-24 MED ORDER — TRAMADOL HCL 50 MG PO TABS
50.0000 mg | ORAL_TABLET | Freq: Once | ORAL | Status: AC
  Administered 2016-04-24: 50 mg via ORAL
  Filled 2016-04-24: qty 1

## 2016-04-24 MED ORDER — TRAMADOL HCL 50 MG PO TABS: 50.0000 mg | ORAL_TABLET | Freq: Four times a day (QID) | ORAL | Status: DC | PRN

## 2016-04-24 NOTE — ED Notes (Signed)
Pt presents to ED with reports of lower middle back pain. Pt denies vomiting, diarrhea, abdominal pain and dysuria. 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

## 2016-04-24 NOTE — Discharge Instructions (Signed)

## 2016-04-24 NOTE — ED Provider Notes (Signed)
[facility] Emergency Department Provider Note  ____________________________________________   I have reviewed the triage vital signs and the nursing notes.   HISTORY  Chief Complaint Back Pain    HPI Tanya Cole is a 82 y.o. female who has a history of arthritis in her back results with low back pain. Skin there since yesterday. It's come and gone for many years. She denies any fever or chills. She denies any numbness or weakness. She denies any radiation of the symptoms. She denies any trauma. She denies any abdominal pain or dysuria. She has no other complaints or symptoms. She took Tylenol and feels somewhat better. However the pain still persists.     Past Medical History  Diagnosis Date  . Reactive airway disease   . Hypertension   . Hypercholesterolemia   . Osteopenia   . Diabetes mellitus [facility])     Patient Active Problem List   Diagnosis Date Noted  . Fatigue 05/24/2015  . Health care maintenance 01/25/2015  . UTI (urinary tract infection) 07/07/2014  . Neuropathy (HCC) 03/24/2014  . Diverticulitis 02/24/2013  . Reactive airway disease 09/15/2012  . Hypertension 09/14/2012  . Hypercholesterolemia 09/14/2012  . Diabetes mellitus (HCC) 09/14/2012    Past Surgical History  Procedure Laterality Date  . Abdominal hysterectomy  1981    prolapse and bleeding, ovaries not removed  . Umbilical hernia repair  7/94  . Breast excisional biopsy Right     Current Outpatient Rx  Name  Route  Sig  Dispense  Refill  . ADVAIR DISKUS 250-50 MCG/DOSE AEPB      INHALE ONE PUFF BY MOUTH EVERY 12 HOURS. RINSE MOUTH AFTER EACH USE   60 each   5   . aspirin 81 MG tablet   Oral   Take 81 mg by mouth daily.         . calcium carbonate (OS-CAL) 600 MG TABS   Oral   Take 600 mg by mouth daily.         . fluticasone (FLONASE) 50 MCG/ACT nasal spray      USE TWO SPRAY(S) IN EACH NOSTRIL ONCE DAILY   16 g   0   .  lisinopril-hydrochlorothiazide (PRINZIDE,ZESTORETIC) 20-12.5 MG tablet      TAKE ONE TABLET BY MOUTH ONCE DAILY   90 tablet   2   . metFORMIN (GLUCOPHAGE) 500 MG tablet      TAKE ONE TABLET BY MOUTH TWICE DAILY WITH A MEAL   180 tablet   0   . Multiple Vitamins-Minerals (VISION FORMULA/LUTEIN PO)   Oral   Take by mouth daily.         . simvastatin (ZOCOR) 10 MG tablet      TAKE ONE TABLET BY MOUTH AT BEDTIME   90 tablet   1     Allergies No known drug allergy  Family History  Problem Relation Age of Onset  . Heart disease Father   . Arthritis Mother   . Heart disease Mother   . Throat cancer Sister     Social History Social History  Substance Use Topics  . Smoking status: Never Smoker   . Smokeless tobacco: Never Used  . Alcohol Use: No    Review of Systems }Constitutional: No fever/chills Eyes: No visual changes. ENT: No sore throat. No stiff neck no neck pain Cardiovascular: Denies chest pain. Respiratory: Denies shortness of breath. Gastrointestinal:   no vomiting.  No diarrhea.  No constipation. Genitourinary: Negative for dysuria.  Musculoskeletal: Negative lower extremity swelling Skin: Negative for rash. Neurological: Negative for headaches, focal weakness or numbness. 10-point ROS otherwise negative.  ____________________________________________   PHYSICAL EXAM:  VITAL SIGNS: ED Triage Vitals  Enc Vitals Group     BP 04/24/16 0902 147/74 mmHg     Pulse Rate 04/24/16 0902 96     Resp 04/24/16 0902 18     Temp 04/24/16 0902 98.5 F (36.9 C)     Temp Source 04/24/16 0902 Oral     SpO2 04/24/16 0902 100 %     Weight 04/24/16 0902 124 lb (56.246 kg)     Height 04/24/16 0902 5\' 1"  (1.549 m)     Head Cir --      Peak Flow --      Pain Score 04/24/16 0903 10     Pain Loc --      Pain Edu? --      Excl. in GC? --     Constitutional: Alert and oriented. Well appearing and in no acute distress. Eyes: Conjunctivae are normal. PERRL.  EOMI. Head: Atraumatic. Nose: No congestion/rhinnorhea. Mouth/Throat: Mucous membranes are moist.  Oropharynx non-erythematous. Neck: No stridor.   Nontender with no meningismus Cardiovascular: Normal rate, regular rhythm. Grossly normal heart sounds.  Good peripheral circulation. Respiratory: Normal respiratory effort.  No retractions. Lungs CTAB. Abdominal: Soft and nontender. No distention. No guarding no reboundDeep palpation betray is no evidence of AAA Back:  There is minimal tenderness to palpation to the left lumbar paraspinal region which reproduces her pain no shingles lesions or other pathology noted otherwise. No abscess no cellulitis there is no midline tenderness there are no lesions noted. there is no CVA tenderness Musculoskeletal: No lower extremity tenderness. No joint effusions, no DVT signs strong distal pulses no edema Neurologic:  Normal speech and language. No gross focal neurologic deficits are appreciated.  Skin:  Skin is warm, dry and intact. No rash noted. Psychiatric: Mood and affect are normal. Speech and behavior are normal.  ____________________________________________   LABS (all labs ordered are listed, but only abnormal results are displayed)  Labs Reviewed  URINALYSIS COMPLETEWITH MICROSCOPIC ([facility] ONLY) - Abnormal; Notable for the following:    Color, Urine YELLOW (*)    APPearance CLEAR (*)    Leukocytes, UA TRACE (*)    All other components within normal limits  BASIC METABOLIC PANEL - Abnormal; Notable for the following:    Glucose, Bld 110 (*)    BUN 21 (*)    All other components within normal limits  CBC WITH DIFFERENTIAL/PLATELET - Abnormal; Notable for the following:    WBC 13.0 (*)    Neutro Abs 10.4 (*)    All other components within normal limits   ____________________________________________  EKG  I personally interpreted any EKGs ordered by me or triage  ____________________________________________  RADIOLOGY  I reviewed  any imaging ordered by me or triage that were performed during my shift and, if possible, patient and/or family made aware of any abnormal findings. ____________________________________________   PROCEDURES  Procedure(s) performed: None  Critical Care performed: None  ____________________________________________   INITIAL IMPRESSION / ASSESSMENT AND PLAN / ED COURSE  Pertinent labs & imaging results that were available during my care of the patient were reviewed by me and considered in my medical decision making (see chart for details).  Patient is neurologically intact no evidence of cauda equina syndrome has recurrent back pain, she is asking for some sharper pain. This time there is no evidence  the patient is referred abdominal pathology such as AAA, nor is there evidence of cauda equina syndrome or any neurologic pathology. Extensive return precautions given and follow-up understood. Patient and family are very comfortable with this plan and her pain is gone after tramadol they would like to try taking that at home. ____________________________________________   FINAL CLINICAL IMPRESSION(S) / ED DIAGNOSES  Final diagnoses:  None      This chart was dictated using voice recognition software.  Despite best efforts to proofread,  errors can occur which can change meaning.     Jeanmarie Plant, MD 04/24/16 1215

## 2016-04-29 ENCOUNTER — Encounter: Payer: Self-pay | Admitting: Internal Medicine

## 2016-04-29 ENCOUNTER — Ambulatory Visit (INDEPENDENT_AMBULATORY_CARE_PROVIDER_SITE_OTHER): Payer: Commercial Managed Care - HMO | Admitting: Internal Medicine

## 2016-04-29 VITALS — BP 130/66 | HR 89 | Temp 98.7°F | Resp 12 | Ht 61.0 in | Wt 121.8 lb

## 2016-04-29 DIAGNOSIS — M545 Low back pain, unspecified: Secondary | ICD-10-CM

## 2016-04-29 DIAGNOSIS — M549 Dorsalgia, unspecified: Secondary | ICD-10-CM | POA: Insufficient documentation

## 2016-04-29 DIAGNOSIS — D72829 Elevated white blood cell count, unspecified: Secondary | ICD-10-CM | POA: Diagnosis not present

## 2016-04-29 LAB — CBC WITH DIFFERENTIAL/PLATELET
BASOS ABS: 0 {cells}/uL (ref 0–200)
Basophils Relative: 0 %
EOS PCT: 4 %
Eosinophils Absolute: 412 cells/uL (ref 15–500)
HCT: 35.3 % (ref 35.0–45.0)
Hemoglobin: 11.7 g/dL (ref 11.7–15.5)
LYMPHS PCT: 21 %
Lymphs Abs: 2163 cells/uL (ref 850–3900)
MCH: 28.8 pg (ref 27.0–33.0)
MCHC: 33.1 g/dL (ref 32.0–36.0)
MCV: 86.9 fL (ref 80.0–100.0)
MONOS PCT: 7 %
MPV: 10.7 fL (ref 7.5–12.5)
Monocytes Absolute: 721 cells/uL (ref 200–950)
NEUTROS ABS: 7004 {cells}/uL (ref 1500–7800)
NEUTROS PCT: 68 %
PLATELETS: 375 10*3/uL (ref 140–400)
RBC: 4.06 MIL/uL (ref 3.80–5.10)
RDW: 13.4 % (ref 11.0–15.0)
WBC: 10.3 10*3/uL (ref 3.8–10.8)

## 2016-04-29 NOTE — Progress Notes (Signed)
Pre-visit discussion using our clinic review tool. No additional management support is needed unless otherwise documented below in the visit note.  

## 2016-04-29 NOTE — Progress Notes (Signed)
[name], female   DOB: 06-15-1933, 82 y.o.   MRN: [id number]   Subjective:    [name], female    DOB: 03-30-1933, 82 y.o.   MRN: [id number]  HPI  Patient here for an ER follow up.  She was seen in the ER 04/24/16 for low back pain.  Xray revealed no lumbar spine fracture, mild to moderate degenerative changes, minimal 96m anterolisthesis at L4-5, probably degenerative.  Had elevated white blood cell count.  She was given tramadol.  Took one and it made her sick and caused itching.  She is taking occasional tylenol and ibuprofen.  Helps some.  She reports that she previously noticed bilateral pain and pain extending down both to both knees.  This is better. She also has noticed some spasms - bilateral - sides.  Better.  Now pain more localized to her right lower back into her buttock.  No headache.  Eating.  No further nausea or vomiting.  No abdominal pain or cramping.  No fever.  No rash.     Past Medical History  Diagnosis Date  . Reactive airway disease   . Hypertension   . Hypercholesterolemia   . Osteopenia   . Diabetes mellitus ([facility]    Past Surgical History  Procedure Laterality Date  . Abdominal hysterectomy  1981    prolapse and bleeding, ovaries not removed  . Umbilical hernia repair  7/94  . Breast excisional biopsy Right    Family History  Problem Relation Age of Onset  . Heart disease Father   . Arthritis Mother   . Heart disease Mother   . Throat cancer Sister    Social History   Social History  . Marital Status: Widowed    Spouse Name: N/A  . Number of Children: 3  . Years of Education: N/A   Social History Main Topics  . Smoking status: Never Smoker   . Smokeless tobacco: Never Used  . Alcohol Use: No  . Drug Use: No  . Sexual Activity: No   Other Topics Concern  . None   Social History Narrative    Outpatient Encounter Prescriptions as of 04/29/2016  Medication Sig  . ADVAIR DISKUS 250-50 MCG/DOSE AEPB  INHALE ONE PUFF BY MOUTH EVERY 12 HOURS. RINSE MOUTH AFTER EACH USE  . aspirin 81 MG tablet Take 81 mg by mouth daily.  . calcium carbonate (OS-CAL) 600 MG TABS Take 600 mg by mouth daily.  . fluticasone (FLONASE) 50 MCG/ACT nasal spray USE TWO SPRAY(S) IN EACH NOSTRIL ONCE DAILY  . lisinopril-hydrochlorothiazide (PRINZIDE,ZESTORETIC) 20-12.5 MG tablet TAKE ONE TABLET BY MOUTH ONCE DAILY  . metFORMIN (GLUCOPHAGE) 500 MG tablet TAKE ONE TABLET BY MOUTH TWICE DAILY WITH A MEAL  . Multiple Vitamins-Minerals (VISION FORMULA/LUTEIN PO) Take by mouth daily.  . simvastatin (ZOCOR) 10 MG tablet TAKE ONE TABLET BY MOUTH AT BEDTIME  . traMADol (ULTRAM) 50 MG tablet Take 1 tablet (50 mg total) by mouth every 6 (six) hours as needed.   No facility-administered encounter medications on file as of 04/29/2016.    Review of Systems  Constitutional: Negative for fever and appetite change.  Respiratory: Negative for chest tightness and shortness of breath.   Cardiovascular: Negative for chest pain and leg swelling.  Gastrointestinal: Negative for nausea, vomiting, abdominal pain and diarrhea.  Genitourinary: Negative for dysuria and difficulty urinating.  Musculoskeletal: Positive for back pain. Negative for joint swelling.  Skin: Negative for color change and rash.  Neurological: Negative for dizziness and headaches.  Psychiatric/Behavioral: Negative for dysphoric mood and agitation.       Objective:    Physical Exam  Constitutional: She appears well-developed and well-nourished. No distress.  Neck: Neck supple.  Cardiovascular: Normal rate and regular rhythm.   Pulmonary/Chest: Breath sounds normal. No respiratory distress. She has no wheezes.  Abdominal: Soft. Bowel sounds are normal. There is no tenderness.  Musculoskeletal: She exhibits no edema or tenderness.  Increased tenderness to palpation over the lower spine and into the right buttock.  Increased pain with straight leg raise - bilateral.   No pain over there ribs.    Lymphadenopathy:    She has no cervical adenopathy.  Skin: No rash noted. No erythema.    BP 130/66 mmHg  Pulse 89  Temp(Src) 98.7 F (37.1 C) (Oral)  Resp 12  Ht 5' 1" (1.549 m)  Wt 121 lb 12 oz (55.225 kg)  BMI 23.02 kg/m2  SpO2 96% Wt Readings from Last 3 Encounters:  04/29/16 121 lb 12 oz (55.225 kg)  04/24/16 124 lb (56.246 kg)  01/22/16 124 lb 12 oz (56.586 kg)     Lab Results  Component Value Date   WBC 10.3 04/29/2016   HGB 11.7 04/29/2016   HCT 35.3 04/29/2016   PLT 375 04/29/2016   GLUCOSE 109* 04/29/2016   CHOL 162 01/19/2016   TRIG 192.0* 01/19/2016   HDL 45.00 01/19/2016   LDLDIRECT 124.2 07/22/2014   LDLCALC 79 01/19/2016   ALT 8 04/29/2016   AST 9* 04/29/2016   NA 142 04/29/2016   K 4.3 04/29/2016   CL 103 04/29/2016   CREATININE 0.69 04/29/2016   BUN 21 04/29/2016   CO2 25 04/29/2016   TSH 2.72 05/29/2015   HGBA1C 6.4 01/19/2016   MICROALBUR <0.7 01/22/2016    Dg Lumbar Spine Complete  04/24/2016  CLINICAL DATA:  Low back pain for 1 week.  No reported injury. EXAM: LUMBAR SPINE - COMPLETE 4+ VIEW COMPARISON:  None. FINDINGS: This report assumes 5 non rib-bearing lumbar vertebrae. Slight dextrocurvature of the lumbar spine. Lumbar vertebral body heights are preserved, with no fracture. Mild to moderate degenerative disc disease throughout the lumbar spine, most prominent at L5-S1. Minimal 3 mm anterolisthesis at L4-5. Moderate facet arthropathy bilaterally in the lower lumbar spine. No aggressive appearing focal osseous lesions. Aortic atherosclerosis. IMPRESSION: 1. No lumbar spine fracture. 2. Mild-to-moderate degenerative changes as described. 3. Minimal 3 mm anterolisthesis at L4-5, probably degenerative. 4. Aortic atherosclerosis. Electronically Signed   By: Jason A Poff M.D.   On: 04/24/2016 10:26       Assessment & Plan:   Problem List Items Addressed This Visit    Back pain - Primary    Back pain and exam as  outlined.  Previous side pain and some joint aching.  Pain now mostly localized to right lower back and buttock.  Xray as outlined.  Recheck cbc.  Check esr.  Question etiology.  Discussed MRI.  May need referral to specialist.        Relevant Orders   MR Lumbar Spine Wo Contrast   Hepatic function panel (Completed)   Sedimentation rate (Completed)   Basic metabolic panel (Completed)    Other Visit Diagnoses    Leukocytosis        Relevant Orders    CBC with Differential/Platelet (Completed)        , , MD  

## 2016-04-30 ENCOUNTER — Encounter: Payer: Self-pay | Admitting: Internal Medicine

## 2016-04-30 LAB — HEPATIC FUNCTION PANEL
ALBUMIN: 4.2 g/dL (ref 3.6–5.1)
ALT: 8 U/L (ref 6–29)
AST: 9 U/L — ABNORMAL LOW (ref 10–35)
Alkaline Phosphatase: 69 U/L (ref 33–130)
BILIRUBIN TOTAL: 0.3 mg/dL (ref 0.2–1.2)
Bilirubin, Direct: 0.1 mg/dL (ref <=0.2)
Indirect Bilirubin: 0.2 mg/dL (ref 0.2–1.2)
Total Protein: 7.5 g/dL (ref 6.1–8.1)

## 2016-04-30 LAB — BASIC METABOLIC PANEL
BUN: 21 mg/dL (ref 7–25)
CHLORIDE: 103 mmol/L (ref 98–110)
CO2: 25 mmol/L (ref 20–31)
CREATININE: 0.69 mg/dL (ref 0.60–0.88)
Calcium: 10.4 mg/dL (ref 8.6–10.4)
Glucose, Bld: 109 mg/dL — ABNORMAL HIGH (ref 65–99)
POTASSIUM: 4.3 mmol/L (ref 3.5–5.3)
Sodium: 142 mmol/L (ref 135–146)

## 2016-04-30 LAB — SEDIMENTATION RATE: SED RATE: 90 mm/h — AB (ref 0–30)

## 2016-04-30 NOTE — Assessment & Plan Note (Signed)
Back pain and exam as outlined.  Previous side pain and some joint aching.  Pain now mostly localized to right lower back and buttock.  Xray as outlined.  Recheck cbc.  Check esr.  Question etiology.  Discussed MRI.  May need referral to specialist.

## 2016-05-02 ENCOUNTER — Other Ambulatory Visit: Payer: Self-pay | Admitting: Internal Medicine

## 2016-05-02 DIAGNOSIS — R700 Elevated erythrocyte sedimentation rate: Secondary | ICD-10-CM

## 2016-05-02 DIAGNOSIS — M545 Low back pain: Secondary | ICD-10-CM

## 2016-05-02 NOTE — Progress Notes (Signed)
Order placed for rheumatology referral.  

## 2016-05-04 DIAGNOSIS — M545 Low back pain: Secondary | ICD-10-CM | POA: Diagnosis not present

## 2016-05-04 DIAGNOSIS — M25512 Pain in left shoulder: Secondary | ICD-10-CM | POA: Diagnosis not present

## 2016-05-04 DIAGNOSIS — R700 Elevated erythrocyte sedimentation rate: Secondary | ICD-10-CM | POA: Insufficient documentation

## 2016-05-04 DIAGNOSIS — M25511 Pain in right shoulder: Secondary | ICD-10-CM | POA: Diagnosis not present

## 2016-05-04 DIAGNOSIS — M25551 Pain in right hip: Secondary | ICD-10-CM | POA: Diagnosis not present

## 2016-05-04 DIAGNOSIS — M25552 Pain in left hip: Secondary | ICD-10-CM | POA: Diagnosis not present

## 2016-05-11 ENCOUNTER — Other Ambulatory Visit: Payer: Self-pay | Admitting: Internal Medicine

## 2016-05-16 DIAGNOSIS — M353 Polymyalgia rheumatica: Secondary | ICD-10-CM | POA: Insufficient documentation

## 2016-05-16 DIAGNOSIS — Z7952 Long term (current) use of systemic steroids: Secondary | ICD-10-CM | POA: Insufficient documentation

## 2016-05-25 ENCOUNTER — Other Ambulatory Visit (INDEPENDENT_AMBULATORY_CARE_PROVIDER_SITE_OTHER): Payer: Commercial Managed Care - HMO

## 2016-05-25 DIAGNOSIS — E7800 Pure hypercholesterolemia, unspecified: Secondary | ICD-10-CM | POA: Diagnosis not present

## 2016-05-25 DIAGNOSIS — I10 Essential (primary) hypertension: Secondary | ICD-10-CM | POA: Diagnosis not present

## 2016-05-25 DIAGNOSIS — E119 Type 2 diabetes mellitus without complications: Secondary | ICD-10-CM

## 2016-05-25 LAB — TSH: TSH: 2.42 u[IU]/mL (ref 0.35–4.50)

## 2016-05-25 LAB — HEPATIC FUNCTION PANEL
ALT: 11 U/L (ref 0–35)
AST: 10 U/L (ref 0–37)
Albumin: 4.3 g/dL (ref 3.5–5.2)
Alkaline Phosphatase: 48 U/L (ref 39–117)
BILIRUBIN DIRECT: 0.1 mg/dL (ref 0.0–0.3)
BILIRUBIN TOTAL: 0.5 mg/dL (ref 0.2–1.2)
Total Protein: 7.2 g/dL (ref 6.0–8.3)

## 2016-05-25 LAB — CBC WITH DIFFERENTIAL/PLATELET
Basophils Absolute: 0 10*3/uL (ref 0.0–0.1)
Basophils Relative: 0.3 % (ref 0.0–3.0)
Eosinophils Absolute: 0.1 10*3/uL (ref 0.0–0.7)
Eosinophils Relative: 0.6 % (ref 0.0–5.0)
HCT: 40 % (ref 36.0–46.0)
Hemoglobin: 13.4 g/dL (ref 12.0–15.0)
Lymphocytes Relative: 14.8 % (ref 12.0–46.0)
Lymphs Abs: 2.2 10*3/uL (ref 0.7–4.0)
MCHC: 33.6 g/dL (ref 30.0–36.0)
MCV: 85.6 fl (ref 78.0–100.0)
Monocytes Absolute: 0.7 10*3/uL (ref 0.1–1.0)
Monocytes Relative: 4.3 % (ref 3.0–12.0)
Neutro Abs: 12 10*3/uL — ABNORMAL HIGH (ref 1.4–7.7)
Neutrophils Relative %: 80 % — ABNORMAL HIGH (ref 43.0–77.0)
Platelets: 192 10*3/uL (ref 150.0–400.0)
RBC: 4.68 Mil/uL (ref 3.87–5.11)
RDW: 15 % (ref 11.5–15.5)
WBC: 15 10*3/uL — ABNORMAL HIGH (ref 4.0–10.5)

## 2016-05-25 LAB — LIPID PANEL
CHOLESTEROL: 165 mg/dL (ref 0–200)
HDL: 65.8 mg/dL (ref >39.00)
LDL CALC: 67 mg/dL (ref 0–99)
NONHDL: 99.37
Total CHOL/HDL Ratio: 3
Triglycerides: 164 mg/dL — ABNORMAL HIGH (ref 0.0–149.0)
VLDL: 32.8 mg/dL (ref 0.0–40.0)

## 2016-05-25 LAB — BASIC METABOLIC PANEL
BUN: 22 mg/dL (ref 6–23)
CALCIUM: 10.1 mg/dL (ref 8.4–10.5)
CHLORIDE: 101 meq/L (ref 96–112)
CO2: 28 meq/L (ref 19–32)
Creatinine, Ser: 0.9 mg/dL (ref 0.40–1.20)
GFR: 63.88 mL/min (ref >60.00)
GLUCOSE: 119 mg/dL — AB (ref 70–99)
Potassium: 4.1 mEq/L (ref 3.5–5.1)
SODIUM: 138 meq/L (ref 135–145)

## 2016-05-25 LAB — HEMOGLOBIN A1C: HEMOGLOBIN A1C: 6.4 % (ref 4.6–6.5)

## 2016-05-26 ENCOUNTER — Other Ambulatory Visit: Payer: Self-pay | Admitting: Internal Medicine

## 2016-05-26 NOTE — Telephone Encounter (Signed)
Had labs checked yesterday. Is following up tomorrow. Allene Dillon, CMA

## 2016-05-26 NOTE — Telephone Encounter (Signed)
ok'd refill for metformin #180 with one refill.

## 2016-05-27 ENCOUNTER — Encounter: Payer: Self-pay | Admitting: Internal Medicine

## 2016-05-27 ENCOUNTER — Ambulatory Visit (INDEPENDENT_AMBULATORY_CARE_PROVIDER_SITE_OTHER): Payer: Commercial Managed Care - HMO | Admitting: Internal Medicine

## 2016-05-27 VITALS — BP 110/56 | HR 77 | Temp 98.9°F | Resp 16 | Wt 120.0 lb

## 2016-05-27 DIAGNOSIS — E7800 Pure hypercholesterolemia, unspecified: Secondary | ICD-10-CM

## 2016-05-27 DIAGNOSIS — E119 Type 2 diabetes mellitus without complications: Secondary | ICD-10-CM

## 2016-05-27 DIAGNOSIS — G629 Polyneuropathy, unspecified: Secondary | ICD-10-CM

## 2016-05-27 DIAGNOSIS — J4520 Mild intermittent asthma, uncomplicated: Secondary | ICD-10-CM

## 2016-05-27 DIAGNOSIS — D72829 Elevated white blood cell count, unspecified: Secondary | ICD-10-CM | POA: Diagnosis not present

## 2016-05-27 DIAGNOSIS — I10 Essential (primary) hypertension: Secondary | ICD-10-CM

## 2016-05-27 DIAGNOSIS — M353 Polymyalgia rheumatica: Secondary | ICD-10-CM

## 2016-05-27 MED ORDER — MUPIROCIN 2 % EX OINT: 1.0000 "application " | TOPICAL_OINTMENT | Freq: Two times a day (BID) | CUTANEOUS | 0 refills | Status: DC

## 2016-05-27 NOTE — Progress Notes (Signed)
[name], female   DOB: [birth year], 82 y.o.   MRN: [id number]   Subjective:    [name], female    DOB: 1932-12-20, 82 y.o.   MRN: [id number]  HPI  Patient here for a scheduled follow up.  States her back is better.  Saw rheumatology.  Diagnosed with PMR.  Was started on prednisone.  Good response.  Feels better.  Sugars are doing ok on prednisone.  States sugars averaging 112-130s.  No chest pain.  Breathing stable.  No acid reflux.  No abdominal pain or cramping.  Bowels stable.  Blood pressure doing well.     Past Medical History:  Diagnosis Date  . Diabetes mellitus ([place])   . Hypercholesterolemia   . Hypertension   . Osteopenia   . Reactive airway disease    Past Surgical History:  Procedure Laterality Date  . ABDOMINAL HYSTERECTOMY  1981   prolapse and bleeding, ovaries not removed  . BREAST EXCISIONAL BIOPSY Right   . UMBILICAL HERNIA REPAIR  7/94   Family History  Problem Relation Age of Onset  . Heart disease Father   . Arthritis Mother   . Heart disease Mother   . Throat cancer Sister    Social History   Social History  . Marital status: Widowed    Spouse name: N/A  . Number of children: 3  . Years of education: N/A   Social History Main Topics  . Smoking status: Never Smoker  . Smokeless tobacco: Never Used  . Alcohol use No  . Drug use: No  . Sexual activity: No   Other Topics Concern  . None   Social History Narrative  . None    Outpatient Encounter Prescriptions as of 05/27/2016  Medication Sig  . ADVAIR DISKUS 250-50 MCG/DOSE AEPB INHALE ONE PUFF BY MOUTH EVERY 12 HOURS. RINSE MOUTH AFTER EACH USE  . calcium carbonate (OS-CAL) 600 MG TABS Take 600 mg by mouth daily.  . fluticasone (FLONASE) 50 MCG/ACT nasal spray USE TWO SPRAY(S) IN EACH NOSTRIL ONCE DAILY  . lisinopril-hydrochlorothiazide (PRINZIDE,ZESTORETIC) 20-12.5 MG tablet TAKE ONE TABLET BY MOUTH ONCE DAILY  . metFORMIN (GLUCOPHAGE) 500 MG tablet  TAKE ONE TABLET BY MOUTH TWICE DAILY WITH A MEAL  . Multiple Vitamins-Minerals (VISION FORMULA/LUTEIN PO) Take by mouth daily.  Derrill Memo ON 06/03/2016] predniSONE (DELTASONE) 5 MG tablet Take 2 tabs and a half (12.5 mg ) for 30 days, start 08/04  . simvastatin (ZOCOR) 10 MG tablet TAKE ONE TABLET BY MOUTH AT BEDTIME  . [DISCONTINUED] traMADol (ULTRAM) 50 MG tablet Take 1 tablet (50 mg total) by mouth every 6 (six) hours as needed.  Marland Kitchen aspirin 81 MG tablet Take 81 mg by mouth daily.  . mupirocin ointment (BACTROBAN) 2 % Apply 1 application topically 2 (two) times daily.   No facility-administered encounter medications on file as of 05/27/2016.     Review of Systems  Constitutional: Negative for fatigue and unexpected weight change.  HENT: Negative for congestion and sinus pressure.   Respiratory: Negative for cough, chest tightness and shortness of breath.   Cardiovascular: Negative for chest pain, palpitations and leg swelling.  Gastrointestinal: Negative for abdominal pain, diarrhea, nausea and vomiting.  Genitourinary: Negative for difficulty urinating and dysuria.  Musculoskeletal: Negative for joint swelling.       Back pain is better.    Neurological: Negative for dizziness, light-headedness and headaches.  Psychiatric/Behavioral: Negative for agitation and dysphoric mood.  Objective:     Blood pressure rechecked by me:  122/64  Physical Exam  Constitutional: She appears well-developed and well-nourished. No distress.  HENT:  Nose: Nose normal.  Mouth/Throat: Oropharynx is clear and moist.  Neck: Neck supple. No thyromegaly present.  Cardiovascular: Normal rate and regular rhythm.   Pulmonary/Chest: Breath sounds normal. No respiratory distress. She has no wheezes.  Abdominal: Soft. Bowel sounds are normal. There is no tenderness.  Musculoskeletal: She exhibits no edema or tenderness.  Lymphadenopathy:    She has no cervical adenopathy.  Skin: No rash noted. No  erythema.  Psychiatric: She has a normal mood and affect. Her behavior is normal.    BP (!) 110/56 (BP Location: Left Arm, Patient Position: Sitting, Cuff Size: Normal)   Pulse 77   Temp 98.9 F (37.2 C) (Oral)   Resp 16   Wt 120 lb (54.4 kg)   SpO2 96%   BMI 22.67 kg/m  Wt Readings from Last 3 Encounters:  05/27/16 120 lb (54.4 kg)  04/29/16 121 lb 12 oz (55.2 kg)  04/24/16 124 lb (56.2 kg)     Lab Results  Component Value Date   WBC 15.0 (H) 05/25/2016   HGB 13.4 05/25/2016   HCT 40.0 05/25/2016   PLT 192.0 05/25/2016   GLUCOSE 119 (H) 05/25/2016   CHOL 165 05/25/2016   TRIG 164.0 (H) 05/25/2016   HDL 65.80 05/25/2016   LDLDIRECT 124.2 07/22/2014   LDLCALC 67 05/25/2016   ALT 11 05/25/2016   AST 10 05/25/2016   NA 138 05/25/2016   K 4.1 05/25/2016   CL 101 05/25/2016   CREATININE 0.90 05/25/2016   BUN 22 05/25/2016   CO2 28 05/25/2016   TSH 2.42 05/25/2016   HGBA1C 6.4 05/25/2016   MICROALBUR <0.7 01/22/2016    Dg Lumbar Spine Complete  Result Date: 04/24/2016 CLINICAL DATA:  Low back pain for 1 week.  No reported injury. EXAM: LUMBAR SPINE - COMPLETE 4+ VIEW COMPARISON:  None. FINDINGS: This report assumes 5 non rib-bearing lumbar vertebrae. Slight dextrocurvature of the lumbar spine. Lumbar vertebral body heights are preserved, with no fracture. Mild to moderate degenerative disc disease throughout the lumbar spine, most prominent at L5-S1. Minimal 3 mm anterolisthesis at L4-5. Moderate facet arthropathy bilaterally in the lower lumbar spine. No aggressive appearing focal osseous lesions. Aortic atherosclerosis. IMPRESSION: 1. No lumbar spine fracture. 2. Mild-to-moderate degenerative changes as described. 3. Minimal 3 mm anterolisthesis at L4-5, probably degenerative. 4. Aortic atherosclerosis. Electronically Signed   By: Ilona Sorrel M.D.   On: 04/24/2016 10:26       Assessment & Plan:   Problem List Items Addressed This Visit    Diabetes mellitus ([place])     Sugars as outlined.  Follow met b and a1c.        Relevant Orders   Hemoglobin A1c   Hypercholesterolemia    On simvastatin.  Low cholesterol diet and exercise.  Follow lipid panel and liver function tests.        Relevant Orders   Lipid panel   Hepatic function panel   Hypertension    Blood pressure under good control.  Continue same medication regimen.  Follow pressures.  Follow metabolic panel.        Relevant Orders   Basic metabolic panel   Neuropathy (HCC)    Off gabapentin.  Stable.        Polymyalgia rheumatica syndrome (HCC)    On prednisone.  Followed by rheumatology.  Better.  Follow.        Reactive airway disease    Followed by Dr Raul Del.  Breathing stable.         Other Visit Diagnoses    Leukocytosis    -  Primary   feel related to being on prednisone.  follow cbc.  pt feeling well.    Relevant Orders   CBC with Differential/Platelet       Einar Pheasant, MD

## 2016-05-29 ENCOUNTER — Encounter: Payer: Self-pay | Admitting: Internal Medicine

## 2016-05-29 NOTE — Assessment & Plan Note (Signed)
Off gabapentin.  Stable.

## 2016-05-29 NOTE — Assessment & Plan Note (Signed)
On simvastatin.  Low cholesterol diet and exercise.  Follow lipid panel and liver function tests.   

## 2016-05-29 NOTE — Assessment & Plan Note (Signed)
Sugars as outlined.  Follow met b and a1c.  

## 2016-05-29 NOTE — Assessment & Plan Note (Signed)
On prednisone.  Followed by rheumatology.  Better.  Follow.

## 2016-05-29 NOTE — Assessment & Plan Note (Signed)
Followed by Dr Fleming.  Breathing stable.   

## 2016-05-29 NOTE — Assessment & Plan Note (Signed)
Blood pressure under good control.  Continue same medication regimen.  Follow pressures.  Follow metabolic panel.   

## 2016-06-01 ENCOUNTER — Other Ambulatory Visit: Payer: Self-pay | Admitting: Internal Medicine

## 2016-06-13 DIAGNOSIS — M353 Polymyalgia rheumatica: Secondary | ICD-10-CM | POA: Diagnosis not present

## 2016-06-30 ENCOUNTER — Other Ambulatory Visit: Payer: Self-pay | Admitting: Internal Medicine

## 2016-06-30 DIAGNOSIS — Z7952 Long term (current) use of systemic steroids: Secondary | ICD-10-CM | POA: Diagnosis not present

## 2016-06-30 DIAGNOSIS — M353 Polymyalgia rheumatica: Secondary | ICD-10-CM | POA: Diagnosis not present

## 2016-06-30 NOTE — Telephone Encounter (Signed)
LOV 05/22/2016. Allene Dillon, CMA

## 2016-08-15 DIAGNOSIS — M353 Polymyalgia rheumatica: Secondary | ICD-10-CM | POA: Diagnosis not present

## 2016-08-30 DIAGNOSIS — Z7952 Long term (current) use of systemic steroids: Secondary | ICD-10-CM | POA: Diagnosis not present

## 2016-08-30 DIAGNOSIS — M353 Polymyalgia rheumatica: Secondary | ICD-10-CM | POA: Diagnosis not present

## 2016-09-30 ENCOUNTER — Other Ambulatory Visit (INDEPENDENT_AMBULATORY_CARE_PROVIDER_SITE_OTHER): Payer: Commercial Managed Care - HMO

## 2016-09-30 DIAGNOSIS — E7800 Pure hypercholesterolemia, unspecified: Secondary | ICD-10-CM

## 2016-09-30 DIAGNOSIS — I10 Essential (primary) hypertension: Secondary | ICD-10-CM

## 2016-09-30 DIAGNOSIS — E119 Type 2 diabetes mellitus without complications: Secondary | ICD-10-CM | POA: Diagnosis not present

## 2016-09-30 DIAGNOSIS — D72829 Elevated white blood cell count, unspecified: Secondary | ICD-10-CM

## 2016-09-30 LAB — HEPATIC FUNCTION PANEL
ALT: 15 U/L (ref 0–35)
AST: 12 U/L (ref 0–37)
Albumin: 4.3 g/dL (ref 3.5–5.2)
Alkaline Phosphatase: 47 U/L (ref 39–117)
BILIRUBIN DIRECT: 0.1 mg/dL (ref 0.0–0.3)
BILIRUBIN TOTAL: 0.5 mg/dL (ref 0.2–1.2)
TOTAL PROTEIN: 6.8 g/dL (ref 6.0–8.3)

## 2016-09-30 LAB — CBC WITH DIFFERENTIAL/PLATELET
BASOS ABS: 0 10*3/uL (ref 0.0–0.1)
Basophils Relative: 0.4 % (ref 0.0–3.0)
EOS PCT: 1.9 % (ref 0.0–5.0)
Eosinophils Absolute: 0.2 10*3/uL (ref 0.0–0.7)
HCT: 39.4 % (ref 36.0–46.0)
Hemoglobin: 13.3 g/dL (ref 12.0–15.0)
LYMPHS ABS: 3.4 10*3/uL (ref 0.7–4.0)
Lymphocytes Relative: 32.1 % (ref 12.0–46.0)
MCHC: 33.7 g/dL (ref 30.0–36.0)
MCV: 89.8 fl (ref 78.0–100.0)
MONO ABS: 0.8 10*3/uL (ref 0.1–1.0)
MONOS PCT: 7.2 % (ref 3.0–12.0)
NEUTROS ABS: 6.1 10*3/uL (ref 1.4–7.7)
NEUTROS PCT: 58.4 % (ref 43.0–77.0)
PLATELETS: 219 10*3/uL (ref 150.0–400.0)
RBC: 4.39 Mil/uL (ref 3.87–5.11)
RDW: 13.5 % (ref 11.5–15.5)
WBC: 10.5 10*3/uL (ref 4.0–10.5)

## 2016-09-30 LAB — LIPID PANEL
CHOLESTEROL: 206 mg/dL — AB (ref 0–200)
HDL: 63.7 mg/dL (ref >39.00)
NonHDL: 142.09
Total CHOL/HDL Ratio: 3
Triglycerides: 228 mg/dL — ABNORMAL HIGH (ref 0.0–149.0)
VLDL: 45.6 mg/dL — ABNORMAL HIGH (ref 0.0–40.0)

## 2016-09-30 LAB — BASIC METABOLIC PANEL
BUN: 18 mg/dL (ref 6–23)
CALCIUM: 11 mg/dL — AB (ref 8.4–10.5)
CO2: 30 meq/L (ref 19–32)
CREATININE: 0.89 mg/dL (ref 0.40–1.20)
Chloride: 103 mEq/L (ref 96–112)
GFR: 64.65 mL/min (ref >60.00)
GLUCOSE: 132 mg/dL — AB (ref 70–99)
Potassium: 4.3 mEq/L (ref 3.5–5.1)
Sodium: 143 mEq/L (ref 135–145)

## 2016-09-30 LAB — LDL CHOLESTEROL, DIRECT: Direct LDL: 119 mg/dL

## 2016-09-30 LAB — HEMOGLOBIN A1C: Hgb A1c MFr Bld: 7 % — ABNORMAL HIGH (ref 4.6–6.5)

## 2016-10-05 ENCOUNTER — Other Ambulatory Visit: Payer: Self-pay | Admitting: Internal Medicine

## 2016-10-05 ENCOUNTER — Encounter: Payer: Self-pay | Admitting: Internal Medicine

## 2016-10-05 ENCOUNTER — Ambulatory Visit (INDEPENDENT_AMBULATORY_CARE_PROVIDER_SITE_OTHER): Payer: Commercial Managed Care - HMO | Admitting: Internal Medicine

## 2016-10-05 DIAGNOSIS — R1032 Left lower quadrant pain: Secondary | ICD-10-CM | POA: Insufficient documentation

## 2016-10-05 DIAGNOSIS — E7800 Pure hypercholesterolemia, unspecified: Secondary | ICD-10-CM

## 2016-10-05 DIAGNOSIS — J4520 Mild intermittent asthma, uncomplicated: Secondary | ICD-10-CM

## 2016-10-05 DIAGNOSIS — M353 Polymyalgia rheumatica: Secondary | ICD-10-CM | POA: Diagnosis not present

## 2016-10-05 DIAGNOSIS — I10 Essential (primary) hypertension: Secondary | ICD-10-CM

## 2016-10-05 DIAGNOSIS — E119 Type 2 diabetes mellitus without complications: Secondary | ICD-10-CM

## 2016-10-05 DIAGNOSIS — G629 Polyneuropathy, unspecified: Secondary | ICD-10-CM

## 2016-10-05 LAB — CALCIUM: CALCIUM: 10.8 mg/dL — AB (ref 8.4–10.5)

## 2016-10-05 NOTE — Progress Notes (Signed)
Pre visit review using our clinic review tool, if applicable. No additional management support is needed unless otherwise documented below in the visit note. 

## 2016-10-05 NOTE — Assessment & Plan Note (Addendum)
Persistent pain as outlined.  Has a history of diverticulitis.  On prednisone.  Concern possibly masking pain/symptoms some.  Recent fall.  Will obtain CT scan to further evaluation.  Hold on medication changes at this time.  She was instructed to hold metformin for scan.

## 2016-10-05 NOTE — Progress Notes (Signed)
[name], female   DOB: Sep 29, 1933, 82 y.o.   MRN: [id number]   Subjective:    [name], female    DOB: 1933/03/13, 82 y.o.   MRN: [id number]  HPI  Patient here for a scheduled follow up.  She has a history of PMR.  Sees rheumatology.  On prednisone.  Gradually tapering.  Has gained some weight.  She fell Thanksgiving Day.  Some bruising on her arm.  No other known injury.  She did notice (starting last week) - LLQ abdominal pain.  Persistent.  Was questioning if could be a diverticular flare.  Also was concerned because had a recent fall.  No bowel change.  Persistent uncomfortable feeling.  Eating.  No vomiting.     Past Medical History:  Diagnosis Date  . Diabetes mellitus ([place])   . Hypercholesterolemia   . Hypertension   . Osteopenia   . Reactive airway disease    Past Surgical History:  Procedure Laterality Date  . ABDOMINAL HYSTERECTOMY  1981   prolapse and bleeding, ovaries not removed  . BREAST EXCISIONAL BIOPSY Right   . UMBILICAL HERNIA REPAIR  7/94   Family History  Problem Relation Age of Onset  . Heart disease Father   . Arthritis Mother   . Heart disease Mother   . Throat cancer Sister    Social History   Social History  . Marital status: Widowed    Spouse name: N/A  . Number of children: 3  . Years of education: N/A   Social History Main Topics  . Smoking status: Never Smoker  . Smokeless tobacco: Never Used  . Alcohol use No  . Drug use: No  . Sexual activity: No   Other Topics Concern  . None   Social History Narrative  . None    Outpatient Encounter Prescriptions as of 10/05/2016  Medication Sig  . ADVAIR DISKUS 250-50 MCG/DOSE AEPB INHALE ONE PUFF BY MOUTH EVERY 12 HOURS. RINSE MOUTH AFTER EACH USE  . aspirin 81 MG tablet Take 81 mg by mouth daily.  . calcium carbonate (OS-CAL) 600 MG TABS Take 600 mg by mouth daily.  . fluticasone (FLONASE) 50 MCG/ACT nasal spray USE TWO SPRAY(S) IN EACH NOSTRIL  ONCE DAILY  . lisinopril-hydrochlorothiazide (PRINZIDE,ZESTORETIC) 20-12.5 MG tablet TAKE ONE TABLET BY MOUTH ONCE DAILY  . metFORMIN (GLUCOPHAGE) 500 MG tablet TAKE ONE TABLET BY MOUTH TWICE DAILY WITH A MEAL  . Multiple Vitamins-Minerals (VISION FORMULA/LUTEIN PO) Take by mouth daily.  . mupirocin ointment (BACTROBAN) 2 % Apply 1 application topically 2 (two) times daily.  . predniSONE (DELTASONE) 5 MG tablet Take 2 tabs and a half (12.5 mg ) for 30 days, start 08/04  . simvastatin (ZOCOR) 10 MG tablet TAKE ONE TABLET BY MOUTH AT BEDTIME   No facility-administered encounter medications on file as of 10/05/2016.     Review of Systems  Constitutional: Negative for appetite change and unexpected weight change.  HENT: Negative for congestion and sinus pressure.   Respiratory: Negative for cough, chest tightness and shortness of breath.   Cardiovascular: Negative for chest pain, palpitations and leg swelling.  Gastrointestinal: Positive for abdominal pain. Negative for blood in stool, diarrhea, nausea and vomiting.  Genitourinary: Negative for difficulty urinating and dysuria.  Musculoskeletal: Negative for joint swelling and myalgias.  Skin: Negative for color change and rash.  Neurological: Negative for dizziness and headaches.  Psychiatric/Behavioral: Negative for agitation and dysphoric mood.  Objective:    Physical Exam  Constitutional: She appears well-developed and well-nourished. No distress.  HENT:  Nose: Nose normal.  Mouth/Throat: Oropharynx is clear and moist.  Neck: Neck supple. No thyromegaly present.  Cardiovascular: Normal rate and regular rhythm.   Pulmonary/Chest: Breath sounds normal. No respiratory distress. She has no wheezes.  Abdominal: Soft. Bowel sounds are normal.  Increased pain - LLQ.    Musculoskeletal: She exhibits no edema or tenderness.  Lymphadenopathy:    She has no cervical adenopathy.  Skin: No rash noted. No erythema.  Psychiatric: She  has a normal mood and affect. Her behavior is normal.    BP 120/66   Pulse 78   Temp 98.7 F (37.1 C) (Oral)   Ht 5' 1" (1.549 m)   Wt 131 lb 9.6 oz (59.7 kg)   SpO2 98%   BMI 24.87 kg/m  Wt Readings from Last 3 Encounters:  10/05/16 131 lb 9.6 oz (59.7 kg)  05/27/16 120 lb (54.4 kg)  04/29/16 121 lb 12 oz (55.2 kg)     Lab Results  Component Value Date   WBC 10.5 09/30/2016   HGB 13.3 09/30/2016   HCT 39.4 09/30/2016   PLT 219.0 09/30/2016   GLUCOSE 132 (H) 09/30/2016   CHOL 206 (H) 09/30/2016   TRIG 228.0 (H) 09/30/2016   HDL 63.70 09/30/2016   LDLDIRECT 119.0 09/30/2016   LDLCALC 67 05/25/2016   ALT 15 09/30/2016   AST 12 09/30/2016   NA 143 09/30/2016   K 4.3 09/30/2016   CL 103 09/30/2016   CREATININE 0.89 09/30/2016   BUN 18 09/30/2016   CO2 30 09/30/2016   TSH 2.42 05/25/2016   HGBA1C 7.0 (H) 09/30/2016   MICROALBUR <0.7 01/22/2016    Dg Lumbar Spine Complete  Result Date: 04/24/2016 CLINICAL DATA:  Low back pain for 1 week.  No reported injury. EXAM: LUMBAR SPINE - COMPLETE 4+ VIEW COMPARISON:  None. FINDINGS: This report assumes 5 non rib-bearing lumbar vertebrae. Slight dextrocurvature of the lumbar spine. Lumbar vertebral body heights are preserved, with no fracture. Mild to moderate degenerative disc disease throughout the lumbar spine, most prominent at L5-S1. Minimal 3 mm anterolisthesis at L4-5. Moderate facet arthropathy bilaterally in the lower lumbar spine. No aggressive appearing focal osseous lesions. Aortic atherosclerosis. IMPRESSION: 1. No lumbar spine fracture. 2. Mild-to-moderate degenerative changes as described. 3. Minimal 3 mm anterolisthesis at L4-5, probably degenerative. 4. Aortic atherosclerosis. Electronically Signed   By: Ilona Sorrel M.D.   On: 04/24/2016 10:26       Assessment & Plan:   Problem List Items Addressed This Visit    Abdominal pain, left lower quadrant    Persistent pain as outlined.  Has a history of  diverticulitis.  On prednisone.  Concern possibly masking pain/symptoms some.  Recent fall.  Will obtain CT scan to further evaluation.  Hold on medication changes at this time.        Relevant Orders   CT Abdomen Pelvis W Contrast   Diabetes mellitus ([place])    a1c just checked 7.0.  Low carb diet.  On prednisone.  Follow met b and a1c.  Stay hydrated.        Hypercholesterolemia    On simvastatin.  Low cholesterol diet and exercise.  Follow lipid panel and liver function tests.        Hypertension    Blood pressure under good control.  Continue same medication regimen.  Follow pressures.  Follow metabolic panel.  Neuropathy ([facility])    Stable.       Polymyalgia rheumatica syndrome ([facility])    On prednisone.  Seeing rheumatology.  Gradually tapering.  Stable.        Reactive airway disease    Breathing stable.         Other Visit Diagnoses    Hypercalcemia    -  Primary   Relevant Orders   Calcium       Einar Pheasant, MD

## 2016-10-05 NOTE — Assessment & Plan Note (Signed)
On simvastatin.  Low cholesterol diet and exercise.  Follow lipid panel and liver function tests.   

## 2016-10-05 NOTE — Assessment & Plan Note (Signed)
On prednisone.  Seeing rheumatology.  Gradually tapering.  Stable.

## 2016-10-05 NOTE — Assessment & Plan Note (Signed)
Blood pressure under good control.  Continue same medication regimen.  Follow pressures.  Follow metabolic panel.   

## 2016-10-05 NOTE — Progress Notes (Signed)
Order placed for ionized calcium.  

## 2016-10-05 NOTE — Assessment & Plan Note (Signed)
a1c just checked 7.0.  Low carb diet.  On prednisone.  Follow met b and a1c.  Stay hydrated.

## 2016-10-05 NOTE — Assessment & Plan Note (Signed)
Stable

## 2016-10-05 NOTE — Assessment & Plan Note (Signed)
Breathing stable.

## 2016-10-05 NOTE — Addendum Note (Signed)
Addended by: Felix Ahmadi A on: 10/05/2016 03:47 PM   Modules accepted: Orders

## 2016-10-06 ENCOUNTER — Ambulatory Visit
Admission: RE | Admit: 2016-10-06 | Discharge: 2016-10-06 | Disposition: A | Discharge status: Home / Self Care | Payer: Commercial Managed Care - HMO | Source: Ambulatory Visit | Attending: Internal Medicine | Admitting: Internal Medicine

## 2016-10-06 DIAGNOSIS — R1032 Left lower quadrant pain: Secondary | ICD-10-CM | POA: Diagnosis not present

## 2016-10-06 DIAGNOSIS — K5730 Diverticulosis of large intestine without perforation or abscess without bleeding: Secondary | ICD-10-CM | POA: Diagnosis not present

## 2016-10-06 DIAGNOSIS — I700 Atherosclerosis of aorta: Secondary | ICD-10-CM | POA: Insufficient documentation

## 2016-10-06 HISTORY — DX: Unspecified asthma, uncomplicated: J45.909

## 2016-10-06 LAB — CALCIUM, IONIZED: Calcium, Ion: 5.9 mg/dL — ABNORMAL HIGH (ref 4.8–5.6)

## 2016-10-06 MED ORDER — IOPAMIDOL (ISOVUE-300) INJECTION 61%
100.0000 mL | Freq: Once | INTRAVENOUS | Status: AC | PRN
  Administered 2016-10-06: 100 mL via INTRAVENOUS

## 2016-10-07 ENCOUNTER — Other Ambulatory Visit: Payer: Self-pay | Admitting: Internal Medicine

## 2016-10-07 DIAGNOSIS — M353 Polymyalgia rheumatica: Secondary | ICD-10-CM | POA: Diagnosis not present

## 2016-10-07 NOTE — Progress Notes (Signed)
Order placed for f/u labs.  

## 2016-10-10 ENCOUNTER — Telehealth: Payer: Self-pay | Admitting: Internal Medicine

## 2016-10-10 NOTE — Telephone Encounter (Signed)
Pt stated she received a phone call from Dr Lorin Picket on Friday but she was out of the office getting blood work done. Pt was returning call regarding CT scan. Thank you!

## 2016-10-11 NOTE — Telephone Encounter (Signed)
Refer to lab notes 

## 2016-10-14 ENCOUNTER — Other Ambulatory Visit: Payer: Self-pay | Admitting: Internal Medicine

## 2016-10-14 DIAGNOSIS — Z1231 Encounter for screening mammogram for malignant neoplasm of breast: Secondary | ICD-10-CM

## 2016-10-17 ENCOUNTER — Other Ambulatory Visit (INDEPENDENT_AMBULATORY_CARE_PROVIDER_SITE_OTHER): Payer: Commercial Managed Care - HMO

## 2016-10-17 LAB — CALCIUM: Calcium: 10.6 mg/dL — ABNORMAL HIGH (ref 8.4–10.5)

## 2016-10-18 LAB — PARATHYROID HORMONE, INTACT (NO CA): PTH: 27 pg/mL (ref 14–64)

## 2016-10-20 ENCOUNTER — Other Ambulatory Visit: Payer: Self-pay | Admitting: Internal Medicine

## 2016-10-20 NOTE — Progress Notes (Unsigned)
Order placed for endocrinology referral.  

## 2016-11-08 ENCOUNTER — Ambulatory Visit (INDEPENDENT_AMBULATORY_CARE_PROVIDER_SITE_OTHER): Payer: Medicare HMO | Admitting: Internal Medicine

## 2016-11-08 ENCOUNTER — Encounter: Payer: Self-pay | Admitting: Internal Medicine

## 2016-11-08 DIAGNOSIS — M353 Polymyalgia rheumatica: Secondary | ICD-10-CM

## 2016-11-08 DIAGNOSIS — G629 Polyneuropathy, unspecified: Secondary | ICD-10-CM | POA: Diagnosis not present

## 2016-11-08 DIAGNOSIS — I10 Essential (primary) hypertension: Secondary | ICD-10-CM | POA: Diagnosis not present

## 2016-11-08 DIAGNOSIS — E119 Type 2 diabetes mellitus without complications: Secondary | ICD-10-CM

## 2016-11-08 DIAGNOSIS — J4520 Mild intermittent asthma, uncomplicated: Secondary | ICD-10-CM

## 2016-11-08 DIAGNOSIS — E7800 Pure hypercholesterolemia, unspecified: Secondary | ICD-10-CM

## 2016-11-08 DIAGNOSIS — R1032 Left lower quadrant pain: Secondary | ICD-10-CM

## 2016-11-08 NOTE — Progress Notes (Signed)
[name], female   DOB: 1933/09/05, 83 y.o.   MRN: [id number]   Subjective:    [name], female    DOB: [birth year], 83 y.o.   MRN: [id number]  HPI  Patient here for a scheduled follow up.  She is accompanied by her daughter.  History obtained from both of them.  She reports she is doing well.  Breathing stable.  No chest pain.  No acid reflux.  No abdominal pain or cramping.  Gradually decreasing prednisone.  Aching better.  Brought in no sugar readings.  Discussed low carb diet and exercise.     Past Medical History:  Diagnosis Date  . Asthma   . Diabetes mellitus ([place])   . Hypercholesterolemia   . Hypertension   . Osteopenia   . Reactive airway disease    Past Surgical History:  Procedure Laterality Date  . ABDOMINAL HYSTERECTOMY  1981   prolapse and bleeding, ovaries not removed  . BREAST EXCISIONAL BIOPSY Right   . UMBILICAL HERNIA REPAIR  7/94   Family History  Problem Relation Age of Onset  . Heart disease Father   . Arthritis Mother   . Heart disease Mother   . Throat cancer Sister    Social History   Social History  . Marital status: Widowed    Spouse name: N/A  . Number of children: 3  . Years of education: N/A   Social History Main Topics  . Smoking status: Never Smoker  . Smokeless tobacco: Never Used  . Alcohol use No  . Drug use: No  . Sexual activity: No   Other Topics Concern  . None   Social History Narrative  . None    Outpatient Encounter Prescriptions as of 11/08/2016  Medication Sig  . ADVAIR DISKUS 250-50 MCG/DOSE AEPB INHALE ONE PUFF BY MOUTH EVERY 12 HOURS. RINSE MOUTH AFTER EACH USE  . aspirin 81 MG tablet Take 81 mg by mouth daily.  . calcium carbonate (OS-CAL) 600 MG TABS Take 600 mg by mouth daily.  . fluticasone (FLONASE) 50 MCG/ACT nasal spray USE TWO SPRAY(S) IN EACH NOSTRIL ONCE DAILY  . lisinopril-hydrochlorothiazide (PRINZIDE,ZESTORETIC) 20-12.5 MG tablet TAKE ONE TABLET BY MOUTH ONCE  DAILY  . metFORMIN (GLUCOPHAGE) 500 MG tablet TAKE ONE TABLET BY MOUTH TWICE DAILY WITH A MEAL  . Multiple Vitamins-Minerals (VISION FORMULA/LUTEIN PO) Take by mouth daily.  . mupirocin ointment (BACTROBAN) 2 % Apply 1 application topically 2 (two) times daily.  . predniSONE (DELTASONE) 5 MG tablet Take 2 tabs and a half (12.5 mg ) for 30 days, start 08/04  . simvastatin (ZOCOR) 10 MG tablet TAKE ONE TABLET BY MOUTH AT BEDTIME   No facility-administered encounter medications on file as of 11/08/2016.     Review of Systems  Constitutional: Negative for appetite change and unexpected weight change.  HENT: Negative for congestion and sinus pressure.   Respiratory: Negative for cough, chest tightness and shortness of breath.   Cardiovascular: Negative for chest pain, palpitations and leg swelling.  Gastrointestinal: Negative for abdominal pain, diarrhea, nausea and vomiting.  Genitourinary: Negative for difficulty urinating and dysuria.  Musculoskeletal: Negative for back pain and joint swelling.  Skin: Negative for color change and rash.  Neurological: Negative for dizziness, light-headedness and headaches.  Psychiatric/Behavioral: Negative for agitation and dysphoric mood.       Objective:    Physical Exam  Constitutional: She appears well-developed and well-nourished. No distress.  HENT:  Nose: Nose normal.  Mouth/Throat: Oropharynx is clear and moist.  Neck: Neck supple. No thyromegaly present.  Cardiovascular: Normal rate and regular rhythm.   Pulmonary/Chest: Breath sounds normal. No respiratory distress. She has no wheezes.  Abdominal: Soft. Bowel sounds are normal. There is no tenderness.  Musculoskeletal: She exhibits no edema or tenderness.  Lymphadenopathy:    She has no cervical adenopathy.  Skin: No rash noted. No erythema.  Psychiatric: She has a normal mood and affect. Her behavior is normal.    BP 130/72   Pulse 94   Wt 135 lb (61.2 kg)   SpO2 97%   BMI 25.51  kg/m  Wt Readings from Last 3 Encounters:  11/08/16 135 lb (61.2 kg)  10/05/16 131 lb 9.6 oz (59.7 kg)  05/27/16 120 lb (54.4 kg)     Lab Results  Component Value Date   WBC 10.5 09/30/2016   HGB 13.3 09/30/2016   HCT 39.4 09/30/2016   PLT 219.0 09/30/2016   GLUCOSE 132 (H) 09/30/2016   CHOL 206 (H) 09/30/2016   TRIG 228.0 (H) 09/30/2016   HDL 63.70 09/30/2016   LDLDIRECT 119.0 09/30/2016   LDLCALC 67 05/25/2016   ALT 15 09/30/2016   AST 12 09/30/2016   NA 143 09/30/2016   K 4.3 09/30/2016   CL 103 09/30/2016   CREATININE 0.89 09/30/2016   BUN 18 09/30/2016   CO2 30 09/30/2016   TSH 2.42 05/25/2016   HGBA1C 7.0 (H) 09/30/2016   MICROALBUR <0.7 01/22/2016    Ct Abdomen Pelvis W Contrast  Result Date: 10/06/2016 CLINICAL DATA:  Left lower quadrant abdominal pain for 1 week. EXAM: CT ABDOMEN AND PELVIS WITH CONTRAST TECHNIQUE: Multidetector CT imaging of the abdomen and pelvis was performed using the standard protocol following bolus administration of intravenous contrast. CONTRAST:  130m ISOVUE-300 IOPAMIDOL (ISOVUE-300) INJECTION 61% COMPARISON:  None. FINDINGS: Lower chest: No acute abnormality. Hepatobiliary: No focal liver abnormality is seen. No gallstones, gallbladder wall thickening, or biliary dilatation. Pancreas: Unremarkable. No pancreatic ductal dilatation or surrounding inflammatory changes. Spleen: Normal in size without focal abnormality. Adrenals/Urinary Tract: Adrenal glands are unremarkable. Kidneys are normal, without renal calculi, focal lesion, or hydronephrosis. Bladder is unremarkable. Stomach/Bowel: The appendix appears normal. Extensive diverticulosis is noted throughout the colon without inflammation. There is no evidence of bowel obstruction. Vascular/Lymphatic: Aortic atherosclerosis. No enlarged abdominal or pelvic lymph nodes. Reproductive: Status post hysterectomy. No adnexal masses. Other: No abdominal wall hernia or abnormality. No abdominopelvic  ascites. Musculoskeletal: Multilevel degenerative disc disease of lumbar spine is noted. IMPRESSION: Extensive diverticulosis of colon without inflammation. Aortic atherosclerosis. No acute abnormality seen in the abdomen or pelvis. Electronically Signed   By: JMarijo Conception M.D.   On: 10/06/2016 13:43       Assessment & Plan:   Problem List Items Addressed This Visit    Abdominal pain, left lower quadrant    Previous CT as outlined.  No acute abnormality.  No further pain.  Bowels stable.        [place]    Low carb diet and exercise.  Follow met b and a1c.        Relevant Orders   Hemoglobin A1c   Microalbumin / creatinine urine ratio   Hypercholesterolemia    On simvastatin.  Low cholesterol diet and exercise.  Follow lipid panel and liver function tests.        Relevant Orders   Hepatic function panel   Lipid panel   Hypertension    Blood pressure  under good control.  Continue same medication regimen.  Follow pressures.  Follow metabolic panel.        Relevant Orders   Basic metabolic panel   Neuropathy ([facility])    Stable.       Polymyalgia rheumatica syndrome ([facility])    On prednisone  Seeing rheumatology.  Gradually decreasing.  Follow.  Stable.       Reactive airway disease    Breathing stable.            Einar Pheasant, MD

## 2016-11-13 ENCOUNTER — Encounter: Payer: Self-pay | Admitting: Internal Medicine

## 2016-11-13 NOTE — Assessment & Plan Note (Signed)
Stable

## 2016-11-13 NOTE — Assessment & Plan Note (Signed)
On prednisone  Seeing rheumatology.  Gradually decreasing.  Follow.  Stable.

## 2016-11-13 NOTE — Assessment & Plan Note (Signed)
On simvastatin.  Low cholesterol diet and exercise.  Follow lipid panel and liver function tests.   

## 2016-11-13 NOTE — Assessment & Plan Note (Signed)
Blood pressure under good control.  Continue same medication regimen.  Follow pressures.  Follow metabolic panel.   

## 2016-11-13 NOTE — Assessment & Plan Note (Signed)
Low carb diet and exercise.  Follow met b and a1c.   

## 2016-11-13 NOTE — Assessment & Plan Note (Signed)
Previous CT as outlined.  No acute abnormality.  No further pain.  Bowels stable.

## 2016-11-13 NOTE — Assessment & Plan Note (Signed)
Breathing stable.

## 2016-11-22 ENCOUNTER — Ambulatory Visit
Admission: RE | Admit: 2016-11-22 | Discharge: 2016-11-22 | Disposition: A | Discharge status: Home / Self Care | Payer: Medicare HMO | Source: Ambulatory Visit | Attending: Internal Medicine | Admitting: Internal Medicine

## 2016-11-22 DIAGNOSIS — Z1231 Encounter for screening mammogram for malignant neoplasm of breast: Secondary | ICD-10-CM | POA: Diagnosis not present

## 2016-11-23 LAB — HM MAMMOGRAPHY

## 2016-11-30 DIAGNOSIS — M353 Polymyalgia rheumatica: Secondary | ICD-10-CM | POA: Diagnosis not present

## 2016-11-30 DIAGNOSIS — Z7952 Long term (current) use of systemic steroids: Secondary | ICD-10-CM | POA: Diagnosis not present

## 2016-12-03 ENCOUNTER — Other Ambulatory Visit: Payer: Self-pay | Admitting: Internal Medicine

## 2016-12-11 ENCOUNTER — Other Ambulatory Visit: Payer: Self-pay | Admitting: Internal Medicine

## 2016-12-26 ENCOUNTER — Ambulatory Visit (INDEPENDENT_AMBULATORY_CARE_PROVIDER_SITE_OTHER): Payer: Medicare HMO

## 2016-12-26 VITALS — BP 120/72 | HR 89 | Temp 98.3°F | Resp 14 | Ht 61.0 in | Wt 135.4 lb

## 2016-12-26 DIAGNOSIS — Z0000 Encounter for general adult medical examination without abnormal findings: Secondary | ICD-10-CM | POA: Diagnosis not present

## 2016-12-26 NOTE — Progress Notes (Signed)
Subjective:   Tanya Cole is a 83 y.o. female who presents for Medicare Annual (Subsequent) preventive examination.  Review of Systems:  No ROS.  Medicare Wellness Visit.  Cardiac Risk Factors include: advanced age (>45men, >31 women);hypertension;diabetes mellitus     Objective:     Vitals: BP 120/72 (BP Location: Left Arm, Patient Position: Sitting, Cuff Size: Normal)   Pulse 89   Temp 98.3 F (36.8 C) (Oral)   Resp 14   Ht 5\' 1"  (1.549 m)   Wt 135 lb 6.4 oz (61.4 kg)   SpO2 99%   BMI 25.58 kg/m   Body mass index is 25.58 kg/m.   Tobacco History  Smoking Status  . Never Smoker  Smokeless Tobacco  . Never Used     Counseling given: Not Answered   Past Medical History:  Diagnosis Date  . Asthma   . Diabetes mellitus ([facility])   . Hypercholesterolemia   . Hypertension   . Osteopenia   . Reactive airway disease    Past Surgical History:  Procedure Laterality Date  . ABDOMINAL HYSTERECTOMY  1981   prolapse and bleeding, ovaries not removed  . BREAST EXCISIONAL BIOPSY Right   . UMBILICAL HERNIA REPAIR  7/94   Family History  Problem Relation Age of Onset  . Heart disease Father   . Arthritis Mother   . Heart disease Mother   . Throat cancer Sister    History  Sexual Activity  . Sexual activity: No    Outpatient Encounter Prescriptions as of 12/26/2016  Medication Sig  . ADVAIR DISKUS 250-50 MCG/DOSE AEPB INHALE ONE PUFF BY MOUTH EVERY 12 HOURS. RINSE MOUTH AFTER EACH USE  . aspirin 81 MG tablet Take 81 mg by mouth daily.  . calcium carbonate (OS-CAL) 600 MG TABS Take 600 mg by mouth daily.  . fluticasone (FLONASE) 50 MCG/ACT nasal spray USE TWO SPRAY(S) IN EACH NOSTRIL ONCE DAILY  . lisinopril-hydrochlorothiazide (PRINZIDE,ZESTORETIC) 20-12.5 MG tablet TAKE ONE TABLET BY MOUTH ONCE DAILY  . metFORMIN (GLUCOPHAGE) 500 MG tablet TAKE ONE TABLET BY MOUTH TWICE DAILY WITH A MEAL  . Multiple Vitamins-Minerals (VISION FORMULA/LUTEIN PO) Take by mouth  daily.  . mupirocin ointment (BACTROBAN) 2 % Apply 1 application topically 2 (two) times daily.  . predniSONE (DELTASONE) 5 MG tablet Take 2 tabs and a half (12.5 mg ) for 30 days, start 08/04  . simvastatin (ZOCOR) 10 MG tablet TAKE ONE TABLET BY MOUTH AT BEDTIME   No facility-administered encounter medications on file as of 12/26/2016.     Activities of Daily Living In your present state of health, do you have any difficulty performing the following activities: 12/26/2016  Hearing? N  Vision? N  Difficulty concentrating or making decisions? N  Walking or climbing stairs? N  Dressing or bathing? N  Doing errands, shopping? Y  Preparing Food and eating ? N  Using the Toilet? N  In the past six months, have you accidently leaked urine? N  Do you have problems with loss of bowel control? N  Managing your Medications? N  Managing your Finances? N  Housekeeping or managing your Housekeeping? N  Some recent data might be hidden    Patient Care Team: 12/28/2016, MD as PCP - General (Internal Medicine)    Assessment:    This is a routine wellness examination for Tanya Cole. The goal of the wellness visit is to assist the patient how to close the gaps in care and create a preventative care plan  for the patient.   Taking calcium as appropriate/Osteoporosis risk reviewed.  Bone density discussed;she declines.  Educational material provided and encouraged follow up with PCP.Marland Kitchen  Medications reviewed; taking without issues or barriers.  Safety issues reviewed; lives alone.  Smoke detectors in the home. No firearms in the home. Wears seatbelts when riding with others. No violence in the home.  No identified risk were noted; The patient was oriented x 3; appropriate in dress and manner and no objective failures at ADL's or IADL's.   BMI; discussed the importance of a healthy diet, water intake and exercise. Educational material provided.  HTN; followed by PCP.  PNA 23 and TDAP  vaccine deferred per patient preference.  Educational material provided.  Sleep pattern: Sleeps 7-8 hrs at night, wakes up feeling rested.  Dental- visits as needed,wears dentures.    Patient Concerns: None at this time. Follow up with PCP as needed.  Exercise Activities and Dietary recommendations Current Exercise Habits: Home exercise routine, Type of exercise: walking, Time (Minutes): 30, Frequency (Times/Week): 1, Weekly Exercise (Minutes/Week): 30, Intensity: Mild  Goals    . Healthy Lifestyle          Stay hydrated, drink plenty of fluids/water Low carb, low cholesterol diet  Stay active and continue waling for exercise      Fall Risk Fall Risk  12/26/2016 10/05/2016 12/25/2015 01/16/2015 11/15/2013  Falls in the past year? No Yes No No No  Number falls in past yr: - 1 - - -  Injury with Fall? - No - - -  Risk for fall due to : - - - Impaired balance/gait -   Depression Screen PHQ 2/9 Scores 12/26/2016 12/25/2015 01/16/2015 11/15/2013  PHQ - 2 Score 0 0 0 0     Cognitive Function MMSE - Mini Mental State Exam 12/25/2015  Orientation to time 5  Orientation to Place 5  Registration 3  Attention/ Calculation 5  Recall 3  Language- name 2 objects 2  Language- repeat 1  Language- follow 3 step command 3  Language- read & follow direction 1  Write a sentence 1  Copy design 1  Total score 30     6CIT Screen 12/26/2016  What Year? 0 points  What month? 0 points  What time? 0 points  Count back from 20 0 points  Months in reverse 0 points  Repeat phrase 0 points  Total Score 0    Immunization History  Administered Date(s) Administered  . Pneumococcal Conjugate-13 07/22/2014   Screening Tests Health Maintenance  Topic Date Due  . TETANUS/TDAP  06/16/1954  . DEXA SCAN  06/16/2000  . PNA vac Low Risk Adult (2 of 2 - PPSV23) 07/23/2015  . FOOT EXAM  05/21/2016  . INFLUENZA VACCINE  10/05/2017 (Originally 05/31/2016)  . OPHTHALMOLOGY EXAM  01/27/2017  . HEMOGLOBIN  A1C  03/31/2017  . MAMMOGRAM  11/22/2017      Plan:    End of life planning; Advance aging; Advanced directives discussed. Copy of current HCPOA/Living Will requested.  Medicare Attestation I have personally reviewed: The patient's medical and social history Their use of alcohol, tobacco or illicit drugs Their current medications and supplements The patient's functional ability including ADLs,fall risks, home safety risks, cognitive, and hearing and visual impairment Diet and physical activities Evidence for depression   The patient's weight, height, BMI, and visual acuity have been recorded in the chart.  I have made referrals and provided education to the patient based on review of the above  and I have provided the patient with a written personalized care plan for preventive services.    During the course of the visit the patient was educated and counseled about the following appropriate screening and preventive services:   Vaccines to include Pneumoccal, Influenza, Hepatitis B, Td, Zostavax, HCV  Colorectal cancer screening-UTD  Bone density screening-educational material provided  Diabetes-followed by PCP  Mammography-UTD  PAP-aged out  Nutrition counseling-educational material provided  Patient Instructions (the written plan) was given to the patient.   Ashok Pall, LPN  9/37/9024   Reviewed above information.  Agree with plan.    Dr Lorin Picket

## 2016-12-26 NOTE — Patient Instructions (Addendum)
  Ms. Tanya Cole , Thank you for taking time to come for your Medicare Wellness Visit. I appreciate your ongoing commitment to your health goals. Please review the following plan we discussed and let me know if I can assist you in the future.  Follow up Dr. Lorin Picket as needed.   These are the goals we discussed: Goals    . Healthy Lifestyle          Stay hydrated, drink plenty of fluids/water Low carb, low cholesterol diet  Stay active and continue waling for exercise       This is a list of the screening recommended for you and due dates:  Health Maintenance  Topic Date Due  . Tetanus Vaccine  06/16/1954  . DEXA scan (bone density measurement)  06/16/2000  . Pneumonia vaccines (2 of 2 - PPSV23) 07/23/2015  . Complete foot exam   05/21/2016  . Flu Shot  10/05/2017*  . Eye exam for diabetics  01/27/2017  . Hemoglobin A1C  03/31/2017  . Mammogram  11/22/2017  *Topic was postponed. The date shown is not the original due date.    Bone Densitometry Introduction Bone densitometry is an imaging test that uses a special X-ray to measure the amount of calcium and other minerals in your bones (bone density). This test is also known as a bone mineral density test or dual-energy X-ray absorptiometry (DXA). The test can measure bone density at your hip and your spine. It is similar to having a regular X-ray. You may have this test to:  Diagnose a condition that causes weak or thin bones (osteoporosis).  Predict your risk of a broken bone (fracture).  Determine how well osteoporosis treatment is working. Tell a health care provider about:  Any allergies you have.  All medicines you are taking, including vitamins, herbs, eye drops, creams, and over-the-counter medicines.  Any problems you or family members have had with anesthetic medicines.  Any blood disorders you have.  Any surgeries you have had.  Any medical conditions you have.  Possibility of pregnancy.  Any other medical test  you had within the previous 14 days that used contrast material. What are the risks? Generally, this is a safe procedure. However, problems can occur and may include the following:  This test exposes you to a very small amount of radiation.  The risks of radiation exposure may be greater to unborn children. What happens before the procedure?  Do not take any calcium supplements for 24 hours before having the test. You can otherwise eat and drink what you usually do.  Take off all metal jewelry, eyeglasses, dental appliances, and any other metal objects. What happens during the procedure?  You may lie on an exam table. There will be an X-ray generator below you and an imaging device above you.  Other devices, such as boxes or braces, may be used to position your body properly for the scan.  You will need to lie still while the machine slowly scans your body.  The images will show up on a computer monitor. What happens after the procedure? You may need more testing at a later time. This information is not intended to replace advice given to you by your health care provider. Make sure you discuss any questions you have with your health care provider. Document Released: 11/08/2004 Document Revised: 03/24/2016 Document Reviewed: 03/27/2014  2017 Elsevier

## 2017-02-13 ENCOUNTER — Telehealth: Payer: Self-pay | Admitting: *Deleted

## 2017-02-13 DIAGNOSIS — M353 Polymyalgia rheumatica: Secondary | ICD-10-CM | POA: Diagnosis not present

## 2017-02-13 DIAGNOSIS — M810 Age-related osteoporosis without current pathological fracture: Secondary | ICD-10-CM | POA: Diagnosis not present

## 2017-02-13 DIAGNOSIS — Z7952 Long term (current) use of systemic steroids: Secondary | ICD-10-CM | POA: Diagnosis not present

## 2017-02-13 NOTE — Telephone Encounter (Signed)
Do you know what department/provider asked for it? We do not have one on file

## 2017-02-13 NOTE — Telephone Encounter (Signed)
[facility] has requested a copy of the patients dexascan Contact  [phone]  Fax [phone]

## 2017-02-14 ENCOUNTER — Other Ambulatory Visit: Payer: Self-pay

## 2017-02-14 DIAGNOSIS — Z7952 Long term (current) use of systemic steroids: Secondary | ICD-10-CM | POA: Diagnosis not present

## 2017-02-14 DIAGNOSIS — M353 Polymyalgia rheumatica: Secondary | ICD-10-CM | POA: Diagnosis not present

## 2017-02-16 ENCOUNTER — Ambulatory Visit (INDEPENDENT_AMBULATORY_CARE_PROVIDER_SITE_OTHER): Payer: Medicare HMO | Admitting: Internal Medicine

## 2017-02-16 ENCOUNTER — Encounter: Payer: Self-pay | Admitting: Internal Medicine

## 2017-02-16 VITALS — BP 126/72 | HR 82 | Temp 98.6°F | Resp 12 | Ht 60.63 in | Wt 136.2 lb

## 2017-02-16 DIAGNOSIS — E7800 Pure hypercholesterolemia, unspecified: Secondary | ICD-10-CM

## 2017-02-16 DIAGNOSIS — E119 Type 2 diabetes mellitus without complications: Secondary | ICD-10-CM

## 2017-02-16 DIAGNOSIS — J4520 Mild intermittent asthma, uncomplicated: Secondary | ICD-10-CM

## 2017-02-16 DIAGNOSIS — M353 Polymyalgia rheumatica: Secondary | ICD-10-CM

## 2017-02-16 DIAGNOSIS — Z0000 Encounter for general adult medical examination without abnormal findings: Secondary | ICD-10-CM | POA: Diagnosis not present

## 2017-02-16 DIAGNOSIS — I10 Essential (primary) hypertension: Secondary | ICD-10-CM

## 2017-02-16 LAB — POCT GLYCOSYLATED HEMOGLOBIN (HGB A1C): HEMOGLOBIN A1C: 6.8

## 2017-02-16 LAB — HM DIABETES FOOT EXAM

## 2017-02-16 NOTE — Assessment & Plan Note (Addendum)
Physical today 02/16/17.  Mammogram 11/23/16 - Birads I.  cologuard 02/09/16 - negative.

## 2017-02-16 NOTE — Progress Notes (Signed)
[name], female   DOB: December 06, 1932, 83 y.o.   MRN: [id number]   Subjective:    [name], female    DOB: [birth year], 83 y.o.   MRN: [id number]  HPI  Patient here for her physical exam.  She reports she is doing relatively well.  Seeing rheumatology for PMR.  Doing well.  Tapering prednisone gradually.  On 59m q day now.  States sugars averaging 120 two hours after eating.  No chest pain.  No sob.  No acid reflux.  No abdominal pain.  Bowels moving.  Has had eye exam.     Past Medical History:  Diagnosis Date  . Asthma   . Diabetes mellitus ([place]   . Hypercholesterolemia   . Hypertension   . Osteopenia   . Reactive airway disease    Past Surgical History:  Procedure Laterality Date  . ABDOMINAL HYSTERECTOMY  1981   prolapse and bleeding, ovaries not removed  . BREAST EXCISIONAL BIOPSY Right   . UMBILICAL HERNIA REPAIR  7/94   Family History  Problem Relation Age of Onset  . Heart disease Father   . Arthritis Mother   . Heart disease Mother   . Throat cancer Sister    Social History   Social History  . Marital status: Widowed    Spouse name: N/A  . Number of children: 3  . Years of education: N/A   Social History Main Topics  . Smoking status: Never Smoker  . Smokeless tobacco: Never Used  . Alcohol use No  . Drug use: No  . Sexual activity: No   Other Topics Concern  . None   Social History Narrative  . None    Outpatient Encounter Prescriptions as of 02/16/2017  Medication Sig  . ADVAIR DISKUS 250-50 MCG/DOSE AEPB INHALE ONE PUFF BY MOUTH EVERY 12 HOURS. RINSE MOUTH AFTER EACH USE  . aspirin 81 MG tablet Take 81 mg by mouth daily.  . calcium carbonate (OS-CAL) 600 MG TABS Take 600 mg by mouth daily.  . fluticasone (FLONASE) 50 MCG/ACT nasal spray USE TWO SPRAY(S) IN EACH NOSTRIL ONCE DAILY  . lisinopril (PRINIVIL,ZESTRIL) 20 MG tablet Take 20 mg by mouth daily.  . metFORMIN (GLUCOPHAGE) 500 MG tablet TAKE ONE TABLET  BY MOUTH TWICE DAILY WITH A MEAL  . Multiple Vitamins-Minerals (VISION FORMULA/LUTEIN PO) Take by mouth daily.  . mupirocin ointment (BACTROBAN) 2 % Apply 1 application topically 2 (two) times daily.  . predniSONE (DELTASONE) 5 MG tablet Take 2 tabs and a half (12.5 mg ) for 30 days, start 08/04  . simvastatin (ZOCOR) 10 MG tablet TAKE ONE TABLET BY MOUTH AT BEDTIME  . [DISCONTINUED] lisinopril-hydrochlorothiazide (PRINZIDE,ZESTORETIC) 20-12.5 MG tablet TAKE ONE TABLET BY MOUTH ONCE DAILY   No facility-administered encounter medications on file as of 02/16/2017.     Review of Systems  Constitutional: Negative for appetite change and unexpected weight change.  HENT: Negative for congestion and sinus pressure.   Eyes: Negative for pain and visual disturbance.  Respiratory: Negative for cough, chest tightness and shortness of breath.   Cardiovascular: Negative for chest pain, palpitations and leg swelling.  Gastrointestinal: Negative for abdominal pain, diarrhea, nausea and vomiting.  Genitourinary: Negative for difficulty urinating and dysuria.  Musculoskeletal: Negative for back pain and joint swelling.  Skin: Negative for color change and rash.  Neurological: Negative for dizziness, light-headedness and headaches.  Hematological: Negative for adenopathy. Does not bruise/bleed easily.  Psychiatric/Behavioral: Negative for agitation and  dysphoric mood.       Objective:    Physical Exam  Constitutional: She is oriented to person, place, and time. She appears well-developed and well-nourished. No distress.  HENT:  Nose: Nose normal.  Mouth/Throat: Oropharynx is clear and moist.  Eyes: Right eye exhibits no discharge. Left eye exhibits no discharge. No scleral icterus.  Neck: Neck supple. No thyromegaly present.  Cardiovascular: Normal rate and regular rhythm.   Pulmonary/Chest: Breath sounds normal. No accessory muscle usage. No tachypnea. No respiratory distress. She has no decreased  breath sounds. She has no wheezes. She has no rhonchi. Right breast exhibits no inverted nipple, no mass, no nipple discharge and no tenderness (no axillary adenopathy). Left breast exhibits no inverted nipple, no mass, no nipple discharge and no tenderness (no axilarry adenopathy).  Abdominal: Soft. Bowel sounds are normal. There is no tenderness.  Musculoskeletal: She exhibits no edema or tenderness.  Lymphadenopathy:    She has no cervical adenopathy.  Neurological: She is alert and oriented to person, place, and time.  Skin: Skin is warm. No rash noted. No erythema.  Psychiatric: She has a normal mood and affect. Her behavior is normal.    BP 126/72 (BP Location: Left Arm, Patient Position: Sitting, Cuff Size: Normal)   Pulse 82   Temp 98.6 F (37 C) (Oral)   Resp 12   Ht 5' 0.63" (1.54 m)   Wt 136 lb 3.2 oz (61.8 kg)   SpO2 98%   BMI 26.05 kg/m  Wt Readings from Last 3 Encounters:  02/16/17 136 lb 3.2 oz (61.8 kg)  12/26/16 135 lb 6.4 oz (61.4 kg)  11/08/16 135 lb (61.2 kg)     Lab Results  Component Value Date   WBC 10.5 09/30/2016   HGB 13.3 09/30/2016   HCT 39.4 09/30/2016   PLT 219.0 09/30/2016   GLUCOSE 132 (H) 09/30/2016   CHOL 206 (H) 09/30/2016   TRIG 228.0 (H) 09/30/2016   HDL 63.70 09/30/2016   LDLDIRECT 119.0 09/30/2016   LDLCALC 67 05/25/2016   ALT 15 09/30/2016   AST 12 09/30/2016   NA 143 09/30/2016   K 4.3 09/30/2016   CL 103 09/30/2016   CREATININE 0.89 09/30/2016   BUN 18 09/30/2016   CO2 30 09/30/2016   TSH 2.42 05/25/2016   HGBA1C 6.8 02/16/2017   MICROALBUR <0.7 01/22/2016    Mm Screening Breast Tomo Bilateral  Result Date: 11/23/2016 CLINICAL DATA:  Screening. EXAM: 2D DIGITAL SCREENING BILATERAL MAMMOGRAM WITH CAD AND ADJUNCT TOMO COMPARISON:  Previous exam(s). ACR Breast Density Category b: There are scattered areas of fibroglandular density. FINDINGS: There are no findings suspicious for malignancy. Images were processed with CAD.  IMPRESSION: No mammographic evidence of malignancy. A result letter of this screening mammogram will be mailed directly to the patient. RECOMMENDATION: Screening mammogram in one year. (Code:SM-B-01Y) BI-RADS CATEGORY  1: Negative. Electronically Signed   By: Pamelia Hoit M.D.   On: 11/23/2016 13:32       Assessment & Plan:   Problem List Items Addressed This Visit    Diabetes mellitus ([place])    Low carb diet and exercise.  Follow met b and a1c.  Sugars as outlined.        Relevant Medications   lisinopril (PRINIVIL,ZESTRIL) 20 MG tablet   Other Relevant Orders   POCT HgB A1C (Completed)   Hemoglobin I7T   Basic metabolic panel   Microalbumin / creatinine urine ratio   Health care maintenance    Physical  today 02/16/17.  Mammogram 11/23/16 - Birads I.  cologuard 02/09/16 - negative.        Hypercholesterolemia    On simvastatin.  Low cholesterol diet and exercise.  Follow lipid panel and liver function tests.        Relevant Medications   lisinopril (PRINIVIL,ZESTRIL) 20 MG tablet   Other Relevant Orders   Hepatic function panel   Lipid panel   Hypertension    Blood pressure under good control.  Continue same medication regimen.  Follow pressures.  Follow metabolic panel.        Relevant Medications   lisinopril (PRINIVIL,ZESTRIL) 20 MG tablet   Other Relevant Orders   TSH   Polymyalgia rheumatica syndrome (HCC)    On prednisone - tapering.  On 52m q day now.  Follow.  Doing well.        Reactive airway disease    Breathing stable.         Other Visit Diagnoses    Routine general medical examination at a health care facility    -  Primary       SEinar Pheasant MD

## 2017-02-16 NOTE — Progress Notes (Signed)
8Pre-visit discussion using our clinic review tool. No additional management support is needed unless otherwise documented below in the visit note.

## 2017-02-27 ENCOUNTER — Encounter: Payer: Self-pay | Admitting: Internal Medicine

## 2017-02-27 NOTE — Assessment & Plan Note (Signed)
Breathing stable.

## 2017-02-27 NOTE — Assessment & Plan Note (Signed)
Low carb diet and exercise.  Follow met b and a1c.  Sugars as outlined.   

## 2017-02-27 NOTE — Assessment & Plan Note (Signed)
On prednisone - tapering.  On 3mg  q day now.  Follow.  Doing well.

## 2017-02-27 NOTE — Assessment & Plan Note (Signed)
Blood pressure under good control.  Continue same medication regimen.  Follow pressures.  Follow metabolic panel.   

## 2017-02-27 NOTE — Assessment & Plan Note (Signed)
On simvastatin.  Low cholesterol diet and exercise.  Follow lipid panel and liver function tests.   

## 2017-03-01 DIAGNOSIS — M353 Polymyalgia rheumatica: Secondary | ICD-10-CM | POA: Diagnosis not present

## 2017-03-01 DIAGNOSIS — I10 Essential (primary) hypertension: Secondary | ICD-10-CM | POA: Diagnosis not present

## 2017-03-01 DIAGNOSIS — Z7952 Long term (current) use of systemic steroids: Secondary | ICD-10-CM | POA: Diagnosis not present

## 2017-03-01 DIAGNOSIS — Z8639 Personal history of other endocrine, nutritional and metabolic disease: Secondary | ICD-10-CM | POA: Diagnosis not present

## 2017-03-01 DIAGNOSIS — M810 Age-related osteoporosis without current pathological fracture: Secondary | ICD-10-CM | POA: Diagnosis not present

## 2017-03-20 DIAGNOSIS — H2512 Age-related nuclear cataract, left eye: Secondary | ICD-10-CM | POA: Diagnosis not present

## 2017-03-20 LAB — HM DIABETES EYE EXAM

## 2017-04-26 DIAGNOSIS — M353 Polymyalgia rheumatica: Secondary | ICD-10-CM | POA: Diagnosis not present

## 2017-04-26 DIAGNOSIS — Z7952 Long term (current) use of systemic steroids: Secondary | ICD-10-CM | POA: Diagnosis not present

## 2017-05-30 ENCOUNTER — Encounter: Payer: Self-pay | Admitting: Emergency Medicine

## 2017-05-30 ENCOUNTER — Emergency Department
Admission: EM | Admit: 2017-05-30 | Discharge: 2017-05-30 | Disposition: A | Discharge status: Home / Self Care | Payer: Medicare HMO | Attending: Emergency Medicine | Admitting: Emergency Medicine

## 2017-05-30 ENCOUNTER — Telehealth: Payer: Self-pay | Admitting: Internal Medicine

## 2017-05-30 ENCOUNTER — Emergency Department: Payer: Medicare HMO

## 2017-05-30 DIAGNOSIS — M25561 Pain in right knee: Secondary | ICD-10-CM | POA: Diagnosis not present

## 2017-05-30 DIAGNOSIS — J45909 Unspecified asthma, uncomplicated: Secondary | ICD-10-CM | POA: Insufficient documentation

## 2017-05-30 DIAGNOSIS — Z79899 Other long term (current) drug therapy: Secondary | ICD-10-CM | POA: Diagnosis not present

## 2017-05-30 DIAGNOSIS — M1711 Unilateral primary osteoarthritis, right knee: Secondary | ICD-10-CM | POA: Insufficient documentation

## 2017-05-30 DIAGNOSIS — Z7984 Long term (current) use of oral hypoglycemic drugs: Secondary | ICD-10-CM | POA: Diagnosis not present

## 2017-05-30 DIAGNOSIS — E119 Type 2 diabetes mellitus without complications: Secondary | ICD-10-CM | POA: Diagnosis not present

## 2017-05-30 DIAGNOSIS — Z7982 Long term (current) use of aspirin: Secondary | ICD-10-CM | POA: Diagnosis not present

## 2017-05-30 DIAGNOSIS — I10 Essential (primary) hypertension: Secondary | ICD-10-CM | POA: Insufficient documentation

## 2017-05-30 MED ORDER — KETOROLAC TROMETHAMINE 30 MG/ML IJ SOLN
15.0000 mg | Freq: Once | INTRAMUSCULAR | Status: AC
  Administered 2017-05-30: 15 mg via INTRAMUSCULAR
  Filled 2017-05-30: qty 1

## 2017-05-30 NOTE — Telephone Encounter (Signed)
Called and spoke with patient and daughter gave all information. I have also provided with contact information to [facility]. The other daughter is on the way over now and they will talk about next step and decide where they would like to go. She will call back if I can help in any way.

## 2017-05-30 NOTE — ED Provider Notes (Signed)
[facility] Emergency Department Provider Note   ____________________________________________   First MD Initiated Contact with Patient 05/30/17 1021     (approximate)  I have reviewed the triage vital signs and the nursing notes.   HISTORY  Chief Complaint Knee Pain    HPI Tanya Cole is a 83 y.o. female patient arrived via EMS complaining of posterior right knee pain. Onset of pain was 2 days ago. Patient stated crease pain because her use a cane to assist her ambulation. Patient rates the pain as a 9/10. Patient described a pain as "80". No other palliative measures for complaint.  Past Medical History:  Diagnosis Date  . Asthma   . Diabetes mellitus (HCC)   . Hypercholesterolemia   . Hypertension   . Osteopenia   . Reactive airway disease     Patient Active Problem List   Diagnosis Date Noted  . Abdominal pain, left lower quadrant 10/05/2016  . Polymyalgia rheumatica syndrome (HCC) 05/16/2016  . Long term current use of systemic steroids 05/16/2016  . Elevated erythrocyte sedimentation rate 05/04/2016  . Back pain 04/29/2016  . Fatigue 05/24/2015  . Health care maintenance 01/25/2015  . UTI (urinary tract infection) 07/07/2014  . Neuropathy 03/24/2014  . Diverticulitis 02/24/2013  . Reactive airway disease 09/15/2012  . Hypertension 09/14/2012  . Hypercholesterolemia 09/14/2012  . Diabetes mellitus (HCC) 09/14/2012    Past Surgical History:  Procedure Laterality Date  . ABDOMINAL HYSTERECTOMY  1981   prolapse and bleeding, ovaries not removed  . BREAST EXCISIONAL BIOPSY Right   . UMBILICAL HERNIA REPAIR  7/94    Prior to Admission medications   Medication Sig Start Date End Date Taking? Authorizing Provider  ADVAIR DISKUS 250-50 MCG/DOSE AEPB INHALE ONE PUFF BY MOUTH EVERY 12 HOURS. RINSE MOUTH AFTER EACH USE 02/17/16   Dale [place], MD  aspirin 81 MG tablet Take 81 mg by mouth daily.    [provider]    calcium carbonate (OS-CAL) 600 MG TABS Take 600 mg by mouth daily.    [provider]  fluticasone (FLONASE) 50 MCG/ACT nasal spray USE TWO SPRAY(S) IN EACH NOSTRIL ONCE DAILY 06/30/16   Dale [place], MD  lisinopril (PRINIVIL,ZESTRIL) 20 MG tablet Take 20 mg by mouth daily.    [provider]  metFORMIN (GLUCOPHAGE) 500 MG tablet TAKE ONE TABLET BY MOUTH TWICE DAILY WITH A MEAL 12/12/16   Dale [place], MD  Multiple Vitamins-Minerals (VISION FORMULA/LUTEIN PO) Take by mouth daily.    [provider]  mupirocin ointment (BACTROBAN) 2 % Apply 1 application topically 2 (two) times daily. 05/27/16   Dale [place], MD  predniSONE (DELTASONE) 5 MG tablet Take 2 tabs and a half (12.5 mg ) for 30 days, start 08/04 06/03/16   [provider]  simvastatin (ZOCOR) 10 MG tablet TAKE ONE TABLET BY MOUTH AT BEDTIME 12/12/16   Dale [place], MD    Allergies No known drug allergy and Tramadol  Family History  Problem Relation Age of Onset  . Heart disease Father   . Arthritis Mother   . Heart disease Mother   . Throat cancer Sister     Social History Social History  Substance Use Topics  . Smoking status: Never Smoker  . Smokeless tobacco: Never Used  . Alcohol use No    Review of Systems  Constitutional: No fever/chills Eyes: No visual changes. ENT: No sore throat. Cardiovascular: Denies chest pain. Respiratory: Denies shortness of breath. Gastrointestinal: No abdominal pain.  No nausea, no vomiting.  No diarrhea.  No constipation. Genitourinary: Negative for dysuria. Musculoskeletal:Right knee pain  Skin: Negative for rash. Neurological: Negative for headaches, focal weakness or numbness. Endocrine: Diabetes, hyperlipidemia, and hypertension.  Allergic/Immunilogical: Tramadol ____________________________________________   PHYSICAL EXAM:  VITAL SIGNS: ED Triage Vitals  Enc Vitals Group     BP 05/30/17 1015 (!) 173/73     Pulse Rate  05/30/17 1015 (!) 108     Resp 05/30/17 1015 18     Temp 05/30/17 1015 98.7 F (37.1 C)     Temp Source 05/30/17 1015 Oral     SpO2 05/30/17 1015 96 %     Weight 05/30/17 1019 134 lb (60.8 kg)     Height 05/30/17 1019 5\' 1"  (1.549 m)     Head Circumference --      Peak Flow --      Pain Score 05/30/17 1019 9     Pain Loc --      Pain Edu? --      Excl. in GC? --     Constitutional: Alert and oriented. Well appearing and in no acute distress. Neck: No stridor.  Hematological/Lymphatic/Immunilogical: No cervical lymphadenopathy. Cardiovascular: Normal rate, regular rhythm. Grossly normal heart sounds.  Good peripheral circulation. Respiratory: Normal respiratory effort.  No retractions. Lungs CTAB. Gastrointestinal: Soft and nontender. No distention. No abdominal bruits. No CVA tenderness. Musculoskeletal: No obvious deformity, edema, erythema to the right knee. No crepitus or joint effusion.  Neurologic:  Normal speech and language. No gross focal neurologic deficits are appreciated. No gait instability. Skin:  Skin is warm, dry and intact. No rash noted. Psychiatric: Mood and affect are normal. Speech and behavior are normal.  ____________________________________________   LABS (all labs ordered are listed, but only abnormal results are displayed)  Labs Reviewed - No data to display ____________________________________________  EKG   ____________________________________________  RADIOLOGY  Dg Knee Complete 4 Views Right  Result Date: 05/30/2017 CLINICAL DATA:  Right knee pain. EXAM: RIGHT KNEE - COMPLETE 4+ VIEW COMPARISON:  None. FINDINGS: No acute fracture or malalignment. Chondrocalcinosis. Moderate medial compartment joint space narrowing with small marginal osteophytes. No significant joint effusion. IMPRESSION: 1.  No acute osseous abnormality. 2. Moderate medial compartment degenerative changes. 3. Chondrocalcinosis, which can be age related or seen in CPPD.  Electronically Signed   By: 06/01/2017 M.D.   On: 05/30/2017 10:56    __Except for joint space narrowing secondary to altered arthritis no other findings right knee x-ray. __________________________________________   PROCEDURES  Procedure(s) performed: None  Procedures  Critical Care performed: No  ____________________________________________   INITIAL IMPRESSION / ASSESSMENT AND PLAN / ED COURSE  Pertinent labs & imaging results that were available during my care of the patient were reviewed by me and considered in my medical decision making (see chart for details).  Right knee pain secondary to osteoarthritis. Discussed x-ray finding with patient. Patient advised follow-up with PCP for continual care. Patient advised Tylenol until seen by PCP. Advise elastic knee support.      ____________________________________________   FINAL CLINICAL IMPRESSION(S) / ED DIAGNOSES  Final diagnoses:  Acute pain of right knee  Primary osteoarthritis of right knee      NEW MEDICATIONS STARTED DURING THIS VISIT:  New Prescriptions   No medications on file     Note:  This document was prepared using Dragon voice recognition software and may include unintentional dictation errors.    06/01/2017, PA-C 05/30/17 1135  Myrna Blazer, MD 05/30/17 [phone]

## 2017-05-30 NOTE — Telephone Encounter (Signed)
Would need to be seen to call in pain medication.  Options are - if opening here, acute care or ortho (urgent visit).  Per Efraim Kaufmann, they see walk ins - [address].

## 2017-05-30 NOTE — ED Triage Notes (Signed)
Brought in via ems s.p right knee pain   States she developed pain to posterior right knee yesterday w/o injury  States she is able to ambulate but states she needs a cane  Per ems she was able to walk down steps to the ambulance  No swelling noted

## 2017-05-30 NOTE — Telephone Encounter (Signed)
Pt went to the ER this morning. Dx was arthritis. Pt was told by the ER to call Dr Lorin Picket to get pain medication.   Call pt @ [phone]. Thank you!

## 2017-05-30 NOTE — Discharge Instructions (Signed)
Advised extra strength Tylenol until evaluation by PCP.

## 2017-05-30 NOTE — Telephone Encounter (Signed)
Per conversation [place] Meo), pt going to [facility]

## 2017-05-30 NOTE — Telephone Encounter (Signed)
Called patient states she started having right knee pain on Sunday but had got worse over the last few days. This am she had to call EMS to take her to ED. Xray was done and pt told to get wrap and try heat. She was told to call office for pain medications. She was given a Toradol 30mg  inj in ED. She states while sitting pain now is 6/10. She has tried heat with little help.  Pharmacy and allergies reviewed.

## 2017-05-31 DIAGNOSIS — M1711 Unilateral primary osteoarthritis, right knee: Secondary | ICD-10-CM | POA: Diagnosis not present

## 2017-06-14 DIAGNOSIS — Z7952 Long term (current) use of systemic steroids: Secondary | ICD-10-CM | POA: Diagnosis not present

## 2017-06-14 DIAGNOSIS — M810 Age-related osteoporosis without current pathological fracture: Secondary | ICD-10-CM | POA: Diagnosis not present

## 2017-06-14 DIAGNOSIS — M1711 Unilateral primary osteoarthritis, right knee: Secondary | ICD-10-CM | POA: Diagnosis not present

## 2017-06-14 DIAGNOSIS — M353 Polymyalgia rheumatica: Secondary | ICD-10-CM | POA: Diagnosis not present

## 2017-06-15 ENCOUNTER — Other Ambulatory Visit (INDEPENDENT_AMBULATORY_CARE_PROVIDER_SITE_OTHER): Payer: Medicare HMO

## 2017-06-15 DIAGNOSIS — E7800 Pure hypercholesterolemia, unspecified: Secondary | ICD-10-CM | POA: Diagnosis not present

## 2017-06-15 DIAGNOSIS — I10 Essential (primary) hypertension: Secondary | ICD-10-CM | POA: Diagnosis not present

## 2017-06-15 DIAGNOSIS — E119 Type 2 diabetes mellitus without complications: Secondary | ICD-10-CM

## 2017-06-15 LAB — HEPATIC FUNCTION PANEL
ALT: 11 U/L (ref 0–35)
AST: 11 U/L (ref 0–37)
Albumin: 3.9 g/dL (ref 3.5–5.2)
Alkaline Phosphatase: 62 U/L (ref 39–117)
BILIRUBIN DIRECT: 0.1 mg/dL (ref 0.0–0.3)
BILIRUBIN TOTAL: 0.4 mg/dL (ref 0.2–1.2)
TOTAL PROTEIN: 6.9 g/dL (ref 6.0–8.3)

## 2017-06-15 LAB — MICROALBUMIN / CREATININE URINE RATIO
CREATININE, U: 141.6 mg/dL
MICROALB UR: 2.3 mg/dL — AB (ref 0.0–1.9)
Microalb Creat Ratio: 1.6 mg/g (ref 0.0–30.0)

## 2017-06-15 LAB — BASIC METABOLIC PANEL
BUN: 19 mg/dL (ref 6–23)
CHLORIDE: 105 meq/L (ref 96–112)
CO2: 26 mEq/L (ref 19–32)
CREATININE: 0.73 mg/dL (ref 0.40–1.20)
Calcium: 9.5 mg/dL (ref 8.4–10.5)
GFR: 81.12 mL/min (ref >60.00)
Glucose, Bld: 158 mg/dL — ABNORMAL HIGH (ref 70–99)
POTASSIUM: 4.3 meq/L (ref 3.5–5.1)
Sodium: 139 mEq/L (ref 135–145)

## 2017-06-15 LAB — LDL CHOLESTEROL, DIRECT: Direct LDL: 85 mg/dL

## 2017-06-15 LAB — HEMOGLOBIN A1C: Hgb A1c MFr Bld: 7.2 % — ABNORMAL HIGH (ref 4.6–6.5)

## 2017-06-15 LAB — LIPID PANEL
CHOL/HDL RATIO: 3
Cholesterol: 146 mg/dL (ref 0–200)
HDL: 50.7 mg/dL (ref >39.00)
NONHDL: 95.59
TRIGLYCERIDES: 203 mg/dL — AB (ref 0.0–149.0)
VLDL: 40.6 mg/dL — ABNORMAL HIGH (ref 0.0–40.0)

## 2017-06-15 LAB — TSH: TSH: 1.56 u[IU]/mL (ref 0.35–4.50)

## 2017-06-19 ENCOUNTER — Ambulatory Visit: Payer: Self-pay | Admitting: Internal Medicine

## 2017-06-22 ENCOUNTER — Ambulatory Visit (INDEPENDENT_AMBULATORY_CARE_PROVIDER_SITE_OTHER): Payer: Medicare HMO | Admitting: Internal Medicine

## 2017-06-22 ENCOUNTER — Encounter: Payer: Self-pay | Admitting: Internal Medicine

## 2017-06-22 DIAGNOSIS — E119 Type 2 diabetes mellitus without complications: Secondary | ICD-10-CM | POA: Diagnosis not present

## 2017-06-22 DIAGNOSIS — E7800 Pure hypercholesterolemia, unspecified: Secondary | ICD-10-CM | POA: Diagnosis not present

## 2017-06-22 DIAGNOSIS — I10 Essential (primary) hypertension: Secondary | ICD-10-CM | POA: Diagnosis not present

## 2017-06-22 DIAGNOSIS — R634 Abnormal weight loss: Secondary | ICD-10-CM

## 2017-06-22 DIAGNOSIS — G629 Polyneuropathy, unspecified: Secondary | ICD-10-CM | POA: Diagnosis not present

## 2017-06-22 DIAGNOSIS — J4520 Mild intermittent asthma, uncomplicated: Secondary | ICD-10-CM | POA: Diagnosis not present

## 2017-06-22 MED ORDER — METFORMIN HCL 500 MG PO TABS: 500.0000 mg | ORAL_TABLET | Freq: Two times a day (BID) | ORAL | 1 refills | Status: DC

## 2017-06-22 MED ORDER — SIMVASTATIN 10 MG PO TABS: 10.0000 mg | ORAL_TABLET | Freq: Every day | ORAL | 1 refills | Status: DC

## 2017-06-22 NOTE — Progress Notes (Signed)
Pre-visit discussion using our clinic review tool. No additional management support is needed unless otherwise documented below in the visit note.  

## 2017-06-22 NOTE — Progress Notes (Signed)
[name], female   DOB: [birth year], 83 y.o.   MRN: [id number]   Subjective:    [name], female    DOB: 07/23/1933, 83 y.o.   MRN: [id number]  HPI  Patient here for a scheduled follow up.  She reports her breathing is doing ok.  No increased cough or congestion.  No acid reflux.  Bowels moving.  Some decreased weight.  She relates to decreasing her prednisone.  She is eating well.  No nausea or vomiting.  No abdominal pain.  Was having issues with knee pain.  Saw ortho.  S/p injection.  Wearing a brace.  Does not take hydrocodone regularly.  Overall she feels she is doing relatively well.     Past Medical History:  Diagnosis Date  . Asthma   . Diabetes mellitus ([place])   . Hypercholesterolemia   . Hypertension   . Osteopenia   . Reactive airway disease    Past Surgical History:  Procedure Laterality Date  . ABDOMINAL HYSTERECTOMY  1981   prolapse and bleeding, ovaries not removed  . BREAST EXCISIONAL BIOPSY Right   . UMBILICAL HERNIA REPAIR  7/94   Family History  Problem Relation Age of Onset  . Heart disease Father   . Arthritis Mother   . Heart disease Mother   . Throat cancer Sister    Social History   Social History  . Marital status: Widowed    Spouse name: N/A  . Number of children: 3  . Years of education: N/A   Social History Main Topics  . Smoking status: Never Smoker  . Smokeless tobacco: Never Used  . Alcohol use No  . Drug use: No  . Sexual activity: No   Other Topics Concern  . None   Social History Narrative  . None    Outpatient Encounter Prescriptions as of 06/22/2017  Medication Sig  . ADVAIR DISKUS 250-50 MCG/DOSE AEPB INHALE ONE PUFF BY MOUTH EVERY 12 HOURS. RINSE MOUTH AFTER EACH USE  . aspirin 81 MG tablet Take 81 mg by mouth daily.  . calcium carbonate (OS-CAL) 600 MG TABS Take 600 mg by mouth daily.  . fluticasone (FLONASE) 50 MCG/ACT nasal spray USE TWO SPRAY(S) IN EACH NOSTRIL ONCE DAILY  .  lisinopril (PRINIVIL,ZESTRIL) 20 MG tablet Take 20 mg by mouth daily.  . metFORMIN (GLUCOPHAGE) 500 MG tablet Take 1 tablet (500 mg total) by mouth 2 (two) times daily with a meal.  . Multiple Vitamins-Minerals (VISION FORMULA/LUTEIN PO) Take by mouth daily.  . mupirocin ointment (BACTROBAN) 2 % Apply 1 application topically 2 (two) times daily.  . predniSONE (DELTASONE) 5 MG tablet Take 2 tabs and a half (12.5 mg ) for 30 days, start 08/04  . simvastatin (ZOCOR) 10 MG tablet Take 1 tablet (10 mg total) by mouth at bedtime.  . [DISCONTINUED] metFORMIN (GLUCOPHAGE) 500 MG tablet TAKE ONE TABLET BY MOUTH TWICE DAILY WITH A MEAL  . [DISCONTINUED] simvastatin (ZOCOR) 10 MG tablet TAKE ONE TABLET BY MOUTH AT BEDTIME   No facility-administered encounter medications on file as of 06/22/2017.     Review of Systems  Constitutional: Negative for appetite change.       Has lost weight.    HENT: Negative for congestion and sinus pressure.   Respiratory: Negative for cough, chest tightness and shortness of breath.   Cardiovascular: Negative for chest pain, palpitations and leg swelling.  Gastrointestinal: Negative for abdominal pain, diarrhea, nausea and vomiting.  Genitourinary:  Negative for difficulty urinating and dysuria.  Musculoskeletal: Negative for back pain.       Knee pain as outlined.  Is better.  Seeing ortho.    Skin: Negative for color change and rash.  Neurological: Negative for dizziness, light-headedness and headaches.  Psychiatric/Behavioral: Negative for agitation and dysphoric mood.       Objective:    Physical Exam  Constitutional: She appears well-developed and well-nourished. No distress.  HENT:  Nose: Nose normal.  Mouth/Throat: Oropharynx is clear and moist.  Neck: Neck supple. No thyromegaly present.  Cardiovascular: Normal rate and regular rhythm.   Pulmonary/Chest: Breath sounds normal. No respiratory distress. She has no wheezes.  Abdominal: Soft. Bowel sounds are  normal. There is no tenderness.  Musculoskeletal: She exhibits no edema or tenderness.  Lymphadenopathy:    She has no cervical adenopathy.  Skin: No rash noted. No erythema.  Psychiatric: She has a normal mood and affect. Her behavior is normal.    BP 133/80 (BP Location: Left Arm, Patient Position: Sitting, Cuff Size: Normal)   Pulse 97   Temp 98.4 F (36.9 C) (Oral)   Ht '5\' 1"'  (1.549 m)   Wt 125 lb 9.6 oz (57 kg)   SpO2 96%   BMI 23.73 kg/m  Wt Readings from Last 3 Encounters:  06/22/17 125 lb 9.6 oz (57 kg)  05/30/17 134 lb (60.8 kg)  02/16/17 136 lb 3.2 oz (61.8 kg)     Lab Results  Component Value Date   WBC 10.5 09/30/2016   HGB 13.3 09/30/2016   HCT 39.4 09/30/2016   PLT 219.0 09/30/2016   GLUCOSE 158 (H) 06/15/2017   CHOL 146 06/15/2017   TRIG 203.0 (H) 06/15/2017   HDL 50.70 06/15/2017   LDLDIRECT 85.0 06/15/2017   LDLCALC 67 05/25/2016   ALT 11 06/15/2017   AST 11 06/15/2017   NA 139 06/15/2017   K 4.3 06/15/2017   CL 105 06/15/2017   CREATININE 0.73 06/15/2017   BUN 19 06/15/2017   CO2 26 06/15/2017   TSH 1.56 06/15/2017   HGBA1C 7.2 (H) 06/15/2017   MICROALBUR 2.3 (H) 06/15/2017    Dg Knee Complete 4 Views Right  Result Date: 05/30/2017 CLINICAL DATA:  Right knee pain. EXAM: RIGHT KNEE - COMPLETE 4+ VIEW COMPARISON:  None. FINDINGS: No acute fracture or malalignment. Chondrocalcinosis. Moderate medial compartment joint space narrowing with small marginal osteophytes. No significant joint effusion. IMPRESSION: 1.  No acute osseous abnormality. 2. Moderate medial compartment degenerative changes. 3. Chondrocalcinosis, which can be age related or seen in CPPD. Electronically Signed   By: Titus Dubin M.D.   On: 05/30/2017 10:56       Assessment & Plan:   Problem List Items Addressed This Visit    Diabetes mellitus ([place])    Low carb diet and exercise.  Follow met b and a1c.  Keep up to date with eye checks.        Relevant Medications    metFORMIN (GLUCOPHAGE) 500 MG tablet   simvastatin (ZOCOR) 10 MG tablet   Hypercholesterolemia    Low cholesterol diet and exercise.  On simvastatin.  Follow lipid panel and liver function tests.        Relevant Medications   simvastatin (ZOCOR) 10 MG tablet   Hypertension    Blood pressure under good control.  Continue same medication regimen.  Follow pressures.  Follow metabolic panel.        Relevant Medications   simvastatin (ZOCOR) 10 MG tablet  Neuropathy    Stable.        Reactive airway disease    Breathing stable.  Follow.       Weight loss    Has lost weight.  Eating well.  No nausea or vomiting.  No abdominal pain.  She feels is related to decreasing prednisone.  Follow.  Get her back in soon to reassess.            Einar Pheasant, MD '

## 2017-06-24 ENCOUNTER — Encounter: Payer: Self-pay | Admitting: Internal Medicine

## 2017-06-24 DIAGNOSIS — R634 Abnormal weight loss: Secondary | ICD-10-CM | POA: Insufficient documentation

## 2017-06-24 NOTE — Assessment & Plan Note (Signed)
Breathing stable.  Follow.    

## 2017-06-24 NOTE — Assessment & Plan Note (Signed)
Blood pressure under good control.  Continue same medication regimen.  Follow pressures.  Follow metabolic panel.   

## 2017-06-24 NOTE — Assessment & Plan Note (Signed)
Low carb diet and exercise.  Follow met b and a1c.  Keep up to date with eye checks.

## 2017-06-24 NOTE — Assessment & Plan Note (Signed)
Stable

## 2017-06-24 NOTE — Assessment & Plan Note (Signed)
Low cholesterol diet and exercise.  On simvastatin.  Follow lipid panel and liver function tests.   

## 2017-06-24 NOTE — Assessment & Plan Note (Signed)
Has lost weight.  Eating well.  No nausea or vomiting.  No abdominal pain.  She feels is related to decreasing prednisone.  Follow.  Get her back in soon to reassess.

## 2017-08-14 DIAGNOSIS — M1711 Unilateral primary osteoarthritis, right knee: Secondary | ICD-10-CM | POA: Diagnosis not present

## 2017-08-14 DIAGNOSIS — Z7952 Long term (current) use of systemic steroids: Secondary | ICD-10-CM | POA: Diagnosis not present

## 2017-08-14 DIAGNOSIS — M353 Polymyalgia rheumatica: Secondary | ICD-10-CM | POA: Diagnosis not present

## 2017-08-16 ENCOUNTER — Other Ambulatory Visit: Payer: Self-pay | Admitting: Internal Medicine

## 2017-08-28 DIAGNOSIS — M810 Age-related osteoporosis without current pathological fracture: Secondary | ICD-10-CM | POA: Diagnosis not present

## 2017-08-29 ENCOUNTER — Ambulatory Visit: Payer: Self-pay | Admitting: Internal Medicine

## 2017-08-29 DIAGNOSIS — Z0289 Encounter for other administrative examinations: Secondary | ICD-10-CM

## 2017-09-01 DIAGNOSIS — M810 Age-related osteoporosis without current pathological fracture: Secondary | ICD-10-CM | POA: Diagnosis not present

## 2017-09-01 DIAGNOSIS — E559 Vitamin D deficiency, unspecified: Secondary | ICD-10-CM | POA: Diagnosis not present

## 2017-11-14 DIAGNOSIS — M353 Polymyalgia rheumatica: Secondary | ICD-10-CM | POA: Diagnosis not present

## 2017-11-14 DIAGNOSIS — Z7952 Long term (current) use of systemic steroids: Secondary | ICD-10-CM | POA: Diagnosis not present

## 2017-12-27 ENCOUNTER — Ambulatory Visit: Payer: Self-pay

## 2018-01-03 ENCOUNTER — Ambulatory Visit (INDEPENDENT_AMBULATORY_CARE_PROVIDER_SITE_OTHER): Payer: Medicare HMO

## 2018-01-03 VITALS — BP 126/64 | HR 85 | Temp 98.1°F | Resp 14 | Ht 60.5 in | Wt 128.4 lb

## 2018-01-03 DIAGNOSIS — Z23 Encounter for immunization: Secondary | ICD-10-CM

## 2018-01-03 DIAGNOSIS — Z0000 Encounter for general adult medical examination without abnormal findings: Secondary | ICD-10-CM | POA: Diagnosis not present

## 2018-01-03 NOTE — Progress Notes (Addendum)
Subjective:   Tanya Cole is a 84 y.o. female who presents for Medicare Annual (Subsequent) preventive examination.  Review of Systems:  No ROS.  Medicare Wellness Visit. Additional risk factors are reflected in the social history.  Cardiac Risk Factors include: advanced age (>35men, >57 women);hypertension;diabetes mellitus     Objective:     Vitals: BP 126/64 (BP Location: Left Arm, Patient Position: Sitting, Cuff Size: Normal)   Pulse 85   Temp 98.1 F (36.7 C) (Oral)   Resp 14   Ht 5' 0.5" (1.537 m)   Wt 128 lb 6.4 oz (58.2 kg)   SpO2 96%   BMI 24.66 kg/m   Body mass index is 24.66 kg/m.  Advanced Directives 01/03/2018 05/30/2017 12/26/2016 04/24/2016 12/25/2015  Does Patient Have a Medical Advance Directive? Yes No Yes Yes Yes  Type of Advance Directive Living will;Healthcare Power of English as a second language teacher of [place];Living will Healthcare Power of [place];Living will Living will  Does patient want to make changes to medical advance directive? No - Patient declined - No - Patient declined - -  Copy of Healthcare Power of Attorney in Chart? No - copy requested - No - copy requested - No - copy requested  Would patient like information on creating a medical advance directive? - No - Patient declined - - -    Tobacco Social History   Tobacco Use  Smoking Status Never Smoker  Smokeless Tobacco Never Used     Counseling given: Not Answered   Clinical Intake:  Pre-visit preparation completed: Yes  Pain : No/denies pain     Nutritional Status: BMI of 19-24  Normal Diabetes: Yes(Followed by PCP)  How often do you need to have someone help you when you read instructions, pamphlets, or other written materials from your doctor or pharmacy?: 1 - Never  Interpreter Needed?: No     Past Medical History:  Diagnosis Date  . Asthma   . Diabetes mellitus ([facility])   . Hypercholesterolemia   . Hypertension   . Osteopenia   . Reactive airway disease     Past Surgical History:  Procedure Laterality Date  . ABDOMINAL HYSTERECTOMY  1981   prolapse and bleeding, ovaries not removed  . BREAST EXCISIONAL BIOPSY Right   . UMBILICAL HERNIA REPAIR  7/94   Family History  Problem Relation Age of Onset  . Heart attack Father   . Arthritis Mother   . Heart disease Mother   . Throat cancer Sister    Social History   Socioeconomic History  . Marital status: Widowed    Spouse name: None  . Number of children: 3  . Years of education: None  . Highest education level: None  Social Needs  . Financial resource strain: Not hard at all  . Food insecurity - worry: Never true  . Food insecurity - inability: Never true  . Transportation needs - medical: No  . Transportation needs - non-medical: No  Occupational History  . None  Tobacco Use  . Smoking status: Never Smoker  . Smokeless tobacco: Never Used  Substance and Sexual Activity  . Alcohol use: No    Alcohol/week: 0.0 oz  . Drug use: No  . Sexual activity: No  Other Topics Concern  . None  Social History Narrative  . None    Outpatient Encounter Medications as of 01/03/2018  Medication Sig  . ADVAIR DISKUS 250-50 MCG/DOSE AEPB INHALE ONE PUFF BY MOUTH EVERY 12 HOURS. RINSE MOUTH AFTER EACH USE  .  aspirin 81 MG tablet Take 81 mg by mouth daily.  . calcium carbonate (OS-CAL) 600 MG TABS Take 600 mg by mouth daily.  . fluticasone (FLONASE) 50 MCG/ACT nasal spray USE TWO SPRAY(S) IN EACH NOSTRIL ONCE DAILY  . lisinopril (PRINIVIL,ZESTRIL) 20 MG tablet Take 20 mg by mouth daily.  . metFORMIN (GLUCOPHAGE) 500 MG tablet Take 1 tablet (500 mg total) by mouth 2 (two) times daily with a meal.  . Multiple Vitamins-Minerals (VISION FORMULA/LUTEIN PO) Take by mouth daily.  . mupirocin ointment (BACTROBAN) 2 % Apply 1 application topically 2 (two) times daily.  . predniSONE (DELTASONE) 5 MG tablet Take 2 tabs and a half (12.5 mg ) for 30 days, start 08/04  . simvastatin (ZOCOR) 10 MG tablet  Take 1 tablet (10 mg total) by mouth at bedtime.   No facility-administered encounter medications on file as of 01/03/2018.     Activities of Daily Living In your present state of health, do you have any difficulty performing the following activities: 01/03/2018  Hearing? N  Vision? N  Difficulty concentrating or making decisions? Y  Comment Some difficulty remembering  Walking or climbing stairs? N  Dressing or bathing? N  Doing errands, shopping? Y  Comment She does not drive.  Preparing Food and eating ? N  Using the Toilet? N  In the past six months, have you accidently leaked urine? N  Do you have problems with loss of bowel control? N  Managing your Medications? N  Managing your Finances? N  Housekeeping or managing your Housekeeping? N  Some recent data might be hidden    Patient Care Team: Dale Bishop, MD as PCP - General (Internal Medicine)    Assessment:   This is a routine wellness examination for Tanya Cole.  The goal of the wellness visit is to assist the patient how to close the gaps in care and create a preventative care plan for the patient.   The roster of all physicians providing medical care to patient is listed in the Snapshot section of the chart.  Taking calcium VIT D as appropriate/Osteoporosis risk reviewed.    Safety issues reviewed; Lives alone.  Smoke and carbon monoxide detectors in the home. No firearms in the home. Wears seatbelts when driving or riding with others. No violence in the home.  They do not have excessive sun exposure.  Discussed the need for sun protection: hats, long sleeves and the use of sunscreen if there is significant sun exposure.  Patient is alert, normal appearance, oriented to person/place/and time.  Correctly identified the president of the [place] and recalls of 2/3 words. Performs simple calculations and can read correct time from watch face.  Displays appropriate judgement.  No new identified risk were noted.  No  failures at ADL's or IADL's.    BMI- discussed the importance of a healthy diet, water intake and the benefits of aerobic exercise. Educational material provided.   24 hour diet recall: Regular diet  Dental- dentures  Eye- Visual acuity not assessed per patient preference since they have regular follow up with the ophthalmologist.  Wears corrective lenses.  Sleep patterns- Sleeps 6-8 hours at night.  Wakes feeling rested.  Pneumovax 23 administered L deltoid, tolerated well. Educational material provided.  TDAP vaccine deferred per patient preference.  Follow up with insurance.  Educational material provided.  She plans to schedule a mammogram.  Educational material provided.  Patient Concerns: None at this time. Follow up with PCP as needed.  Exercise Activities and  Dietary recommendations Current Exercise Habits: The patient does not participate in regular exercise at present  Goals    . Healthy Lifestyle     Stay hydrated, drink plenty of fluids/water Low carb, low cholesterol diet  Exercise       Fall Risk Fall Risk  01/03/2018 12/26/2016 10/05/2016 12/25/2015 01/16/2015  Falls in the past year? No No Yes No No  Number falls in past yr: - - 1 - -  Injury with Fall? - - No - -  Risk for fall due to : - - - - Impaired balance/gait   Depression Screen PHQ 2/9 Scores 01/03/2018 12/26/2016 12/25/2015 01/16/2015  PHQ - 2 Score 0 0 0 0     Cognitive Function MMSE - Mini Mental State Exam 12/25/2015  Orientation to time 5  Orientation to Place 5  Registration 3  Attention/ Calculation 5  Recall 3  Language- name 2 objects 2  Language- repeat 1  Language- follow 3 step command 3  Language- read & follow direction 1  Write a sentence 1  Copy design 1  Total score 30     6CIT Screen 01/03/2018 12/26/2016  What Year? 0 points 0 points  What month? 0 points 0 points  What time? 0 points 0 points  Count back from 20 0 points 0 points  Months in reverse 0 points 0 points    Repeat phrase 0 points 0 points  Total Score 0 0    Immunization History  Administered Date(s) Administered  . Pneumococcal Conjugate-13 07/22/2014  . Pneumococcal Polysaccharide-23 01/03/2018    Screening Tests Health Maintenance  Topic Date Due  . TETANUS/TDAP  06[birth year]16/1952  . PNA vac Low Risk Adult (2 of 2 - PPSV23) 07/23/2015  . INFLUENZA VACCINE  05/31/2017  . MAMMOGRAM  11/23/2017  . HEMOGLOBIN A1C  12/16/2017  . FOOT EXAM  02/16/2018  . OPHTHALMOLOGY EXAM  03/20/2018  . DEXA SCAN  Completed       Plan:    End of life planning; Advance aging; Advanced directives discussed. Copy of current HCPOA/Living Will requested.    I have personally reviewed and noted the following in the patient's chart:   . Medical and social history . Use of alcohol, tobacco or illicit drugs  . Current medications and supplements . Functional ability and status . Nutritional status . Physical activity . Advanced directives . List of other physicians . Hospitalizations, surgeries, and ER visits in previous 12 months . Vitals . Screenings to include cognitive, depression, and falls . Referrals and appointments  In addition, I have reviewed and discussed with patient certain preventive protocols, quality metrics, and best practice recommendations. A written personalized care plan for preventive services as well as general preventive health recommendations were provided to patient.     OBrien-Blaney, Kristien Salatino L, LPN  07/09/2425    I have reviewed the above information and agree with above.   Duncan Dull, MD

## 2018-01-03 NOTE — Patient Instructions (Addendum)
  Tanya Cole , Thank you for taking time to come for your Medicare Wellness Visit. I appreciate your ongoing commitment to your health goals. Please review the following plan we discussed and let me know if I can assist you in the future.   These are the goals we discussed: Goals    . Healthy Lifestyle     Stay hydrated, drink plenty of fluids/water Low carb, low cholesterol diet  Exercise       This is a list of the screening recommended for you and due dates:  Health Maintenance  Topic Date Due  . Tetanus Vaccine  06[birth year]16/1954  . Pneumonia vaccines (2 of 2 - PPSV23) 07/23/2015  . Flu Shot  05/31/2017  . Mammogram  11/23/2017  . Hemoglobin A1C  12/16/2017  . Complete foot exam   02/16/2018  . Eye exam for diabetics  03/20/2018  . DEXA scan (bone density measurement)  Completed

## 2018-01-03 NOTE — Progress Notes (Signed)
  I have reviewed the above information and agree with above.   Kypton Eltringham, MD 

## 2018-01-08 ENCOUNTER — Other Ambulatory Visit: Payer: Self-pay | Admitting: Internal Medicine

## 2018-01-08 DIAGNOSIS — Z1231 Encounter for screening mammogram for malignant neoplasm of breast: Secondary | ICD-10-CM

## 2018-01-24 ENCOUNTER — Ambulatory Visit
Admission: RE | Admit: 2018-01-24 | Discharge: 2018-01-24 | Disposition: A | Discharge status: Home / Self Care | Payer: Medicare HMO | Source: Ambulatory Visit | Attending: Internal Medicine | Admitting: Internal Medicine

## 2018-01-24 DIAGNOSIS — Z1231 Encounter for screening mammogram for malignant neoplasm of breast: Secondary | ICD-10-CM | POA: Insufficient documentation

## 2018-01-30 ENCOUNTER — Ambulatory Visit (INDEPENDENT_AMBULATORY_CARE_PROVIDER_SITE_OTHER): Payer: Medicare HMO | Admitting: Internal Medicine

## 2018-01-30 DIAGNOSIS — I10 Essential (primary) hypertension: Secondary | ICD-10-CM

## 2018-01-30 DIAGNOSIS — G629 Polyneuropathy, unspecified: Secondary | ICD-10-CM | POA: Diagnosis not present

## 2018-01-30 DIAGNOSIS — E1140 Type 2 diabetes mellitus with diabetic neuropathy, unspecified: Secondary | ICD-10-CM | POA: Diagnosis not present

## 2018-01-30 DIAGNOSIS — E1159 Type 2 diabetes mellitus with other circulatory complications: Secondary | ICD-10-CM | POA: Diagnosis not present

## 2018-01-30 DIAGNOSIS — E7800 Pure hypercholesterolemia, unspecified: Secondary | ICD-10-CM | POA: Diagnosis not present

## 2018-01-30 DIAGNOSIS — E119 Type 2 diabetes mellitus without complications: Secondary | ICD-10-CM | POA: Diagnosis not present

## 2018-01-30 DIAGNOSIS — M810 Age-related osteoporosis without current pathological fracture: Secondary | ICD-10-CM

## 2018-01-30 NOTE — Progress Notes (Signed)
[name], female   DOB: 01-19-1933, 84 y.o.   MRN: [id number]   Subjective:    [name], female    DOB: 1933/10/31, 84 y.o.   MRN: [id number]  HPI  Patient here for a scheduled follow up.  She is accompanied by her daughter.  History obtained from both of them.  She reports she is doing relatively well.  Reports some low back pain and hip pain.  Taking tylenol arthritis.  Helps.  Desires no further w/up at this time.  No chest pain.  No sob.  No acid reflux.  No abdominal pain.  Bowels moving.  Seeing rheumatology.  Being followed for PMR.  On 61m of prednisone daily now.  Gradually tapering.  States sugars doing ok.  Reports sugar 120s two hours after breakfast.     Past Medical History:  Diagnosis Date  . Asthma   . Diabetes mellitus ([place]   . Hypercholesterolemia   . Hypertension   . Osteopenia   . Reactive airway disease    Past Surgical History:  Procedure Laterality Date  . ABDOMINAL HYSTERECTOMY  1981   prolapse and bleeding, ovaries not removed  . BREAST EXCISIONAL BIOPSY Right   . UMBILICAL HERNIA REPAIR  7/94   Family History  Problem Relation Age of Onset  . Heart attack Father   . Arthritis Mother   . Heart disease Mother   . Throat cancer Sister    Social History   Socioeconomic History  . Marital status: Widowed    Spouse name: Not on file  . Number of children: 3  . Years of education: Not on file  . Highest education level: Not on file  Occupational History  . Not on file  Social Needs  . Financial resource strain: Not hard at all  . Food insecurity:    Worry: Never true    Inability: Never true  . Transportation needs:    Medical: No    Non-medical: No  Tobacco Use  . Smoking status: Never Smoker  . Smokeless tobacco: Never Used  Substance and Sexual Activity  . Alcohol use: No    Alcohol/week: 0.0 oz  . Drug use: No  . Sexual activity: Never  Lifestyle  . Physical activity:    Days per week: Not  on file    Minutes per session: Not on file  . Stress: Not on file  Relationships  . Social connections:    Talks on phone: Not on file    Gets together: Not on file    Attends religious service: Not on file    Active member of club or organization: Not on file    Attends meetings of clubs or organizations: Not on file    Relationship status: Not on file  Other Topics Concern  . Not on file  Social History Narrative  . Not on file    Outpatient Encounter Medications as of 01/30/2018  Medication Sig  . ADVAIR DISKUS 250-50 MCG/DOSE AEPB INHALE ONE PUFF BY MOUTH EVERY 12 HOURS. RINSE MOUTH AFTER EACH USE  . alendronate (FOSAMAX) 70 MG tablet   . aspirin 81 MG tablet Take 81 mg by mouth daily.  . calcium carbonate (OS-CAL) 600 MG TABS Take 600 mg by mouth daily.  . fluticasone (FLONASE) 50 MCG/ACT nasal spray USE TWO SPRAY(S) IN EACH NOSTRIL ONCE DAILY  . lisinopril (PRINIVIL,ZESTRIL) 20 MG tablet Take 20 mg by mouth daily.  . metFORMIN (GLUCOPHAGE) 500 MG tablet  Take 1 tablet (500 mg total) by mouth 2 (two) times daily with a meal.  . Multiple Vitamins-Minerals (VISION FORMULA/LUTEIN PO) Take by mouth daily.  . mupirocin ointment (BACTROBAN) 2 % Apply 1 application topically 2 (two) times daily.  . predniSONE (DELTASONE) 5 MG tablet Take 2 tabs and a half (12.5 mg ) for 30 days, start 08/04  . simvastatin (ZOCOR) 10 MG tablet Take 1 tablet (10 mg total) by mouth at bedtime.   No facility-administered encounter medications on file as of 01/30/2018.     Review of Systems  Constitutional: Negative for appetite change and unexpected weight change.  HENT: Negative for congestion and sinus pressure.   Respiratory: Negative for cough, chest tightness and shortness of breath.   Cardiovascular: Negative for chest pain, palpitations and leg swelling.  Gastrointestinal: Negative for abdominal pain, diarrhea, nausea and vomiting.  Genitourinary: Negative for difficulty urinating and dysuria.    Musculoskeletal: Negative for joint swelling and myalgias.  Skin: Negative for rash and wound.  Neurological: Negative for dizziness, light-headedness and headaches.  Psychiatric/Behavioral: Negative for agitation and dysphoric mood.       Objective:    Physical Exam  Constitutional: She appears well-developed and well-nourished. No distress.  HENT:  Nose: Nose normal.  Mouth/Throat: Oropharynx is clear and moist.  Neck: Neck supple. No thyromegaly present.  Cardiovascular: Normal rate and regular rhythm.  Pulmonary/Chest: Breath sounds normal. No respiratory distress. She has no wheezes.  Abdominal: Soft. Bowel sounds are normal. There is no tenderness.  Musculoskeletal: She exhibits no edema or tenderness.  Lymphadenopathy:    She has no cervical adenopathy.  Skin: No rash noted. No erythema.  Psychiatric: She has a normal mood and affect. Her behavior is normal.    BP 136/80 (BP Location: Left Arm, Patient Position: Sitting, Cuff Size: Normal)   Pulse 84   Temp 98.8 F (37.1 C) (Oral)   Resp 18   Wt 129 lb (58.5 kg)   SpO2 98%   BMI 24.78 kg/m  Wt Readings from Last 3 Encounters:  01/30/18 129 lb (58.5 kg)  01/03/18 128 lb 6.4 oz (58.2 kg)  06/22/17 125 lb 9.6 oz (57 kg)     Lab Results  Component Value Date   WBC 10.5 09/30/2016   HGB 13.3 09/30/2016   HCT 39.4 09/30/2016   PLT 219.0 09/30/2016   GLUCOSE 158 (H) 06/15/2017   CHOL 146 06/15/2017   TRIG 203.0 (H) 06/15/2017   HDL 50.70 06/15/2017   LDLDIRECT 85.0 06/15/2017   LDLCALC 67 05/25/2016   ALT 11 06/15/2017   AST 11 06/15/2017   NA 139 06/15/2017   K 4.3 06/15/2017   CL 105 06/15/2017   CREATININE 0.73 06/15/2017   BUN 19 06/15/2017   CO2 26 06/15/2017   TSH 1.56 06/15/2017   HGBA1C 7.2 (H) 06/15/2017   MICROALBUR 2.3 (H) 06/15/2017    Mm Screening Breast Tomo Bilateral  Result Date: 01/24/2018 CLINICAL DATA:  Screening. EXAM: DIGITAL SCREENING BILATERAL MAMMOGRAM WITH TOMO AND CAD  COMPARISON:  Previous exam(s). ACR Breast Density Category b: There are scattered areas of fibroglandular density. FINDINGS: There are no findings suspicious for malignancy. Images were processed with CAD. IMPRESSION: No mammographic evidence of malignancy. A result letter of this screening mammogram will be mailed directly to the patient. RECOMMENDATION: Screening mammogram in one year. (Code:SM-B-01Y) BI-RADS CATEGORY  1: Negative. Electronically Signed   By: Nolon Nations M.D.   On: 01/24/2018 13:51  Assessment & Plan:   Problem List Items Addressed This Visit    Diabetes mellitus ([place])    Sugars as outlined.  Low carb diet and exercise.  Follow met b and a1c.       Relevant Orders   Hemoglobin Q9I   Basic metabolic panel   Hypercholesterolemia    On simvastatin.  Low cholesterol diet and exercise.  Follow lipid panel and liver function tests.        Relevant Orders   Hepatic function panel   Lipid panel   Hypertension    Blood pressure under good control.  Continue same medication regimen.  Follow pressures.  Follow metabolic panel.        Relevant Orders   CBC with Differential/Platelet   Neuropathy    Stable.       Osteoporosis    On fosamax.        Relevant Medications   alendronate (FOSAMAX) 70 MG tablet       Einar Pheasant, MD

## 2018-02-05 ENCOUNTER — Encounter: Payer: Self-pay | Admitting: Internal Medicine

## 2018-02-05 DIAGNOSIS — M810 Age-related osteoporosis without current pathological fracture: Secondary | ICD-10-CM | POA: Insufficient documentation

## 2018-02-05 NOTE — Assessment & Plan Note (Signed)
Stable

## 2018-02-05 NOTE — Assessment & Plan Note (Signed)
Blood pressure under good control.  Continue same medication regimen.  Follow pressures.  Follow metabolic panel.   

## 2018-02-05 NOTE — Assessment & Plan Note (Signed)
Sugars as outlined.  Low carb diet and exercise.  Follow met b and a1c.

## 2018-02-05 NOTE — Assessment & Plan Note (Signed)
-  On fosamax

## 2018-02-05 NOTE — Assessment & Plan Note (Signed)
On simvastatin.  Low cholesterol diet and exercise.  Follow lipid panel and liver function tests.   

## 2018-02-08 ENCOUNTER — Other Ambulatory Visit (INDEPENDENT_AMBULATORY_CARE_PROVIDER_SITE_OTHER): Payer: Medicare HMO

## 2018-02-08 DIAGNOSIS — E7800 Pure hypercholesterolemia, unspecified: Secondary | ICD-10-CM | POA: Diagnosis not present

## 2018-02-08 DIAGNOSIS — E119 Type 2 diabetes mellitus without complications: Secondary | ICD-10-CM | POA: Diagnosis not present

## 2018-02-08 DIAGNOSIS — I10 Essential (primary) hypertension: Secondary | ICD-10-CM | POA: Diagnosis not present

## 2018-02-08 LAB — CBC WITH DIFFERENTIAL/PLATELET
BASOS ABS: 0.1 10*3/uL (ref 0.0–0.1)
Basophils Relative: 0.5 % (ref 0.0–3.0)
Eosinophils Absolute: 0.6 10*3/uL (ref 0.0–0.7)
Eosinophils Relative: 5.6 % — ABNORMAL HIGH (ref 0.0–5.0)
HEMATOCRIT: 42.6 % (ref 36.0–46.0)
Hemoglobin: 14.4 g/dL (ref 12.0–15.0)
LYMPHS PCT: 28 % (ref 12.0–46.0)
Lymphs Abs: 2.8 10*3/uL (ref 0.7–4.0)
MCHC: 33.8 g/dL (ref 30.0–36.0)
MCV: 88.2 fl (ref 78.0–100.0)
MONOS PCT: 7.3 % (ref 3.0–12.0)
Monocytes Absolute: 0.7 10*3/uL (ref 0.1–1.0)
Neutro Abs: 5.8 10*3/uL (ref 1.4–7.7)
Neutrophils Relative %: 58.6 % (ref 43.0–77.0)
Platelets: 274 10*3/uL (ref 150.0–400.0)
RBC: 4.84 Mil/uL (ref 3.87–5.11)
RDW: 13.3 % (ref 11.5–15.5)
WBC: 9.9 10*3/uL (ref 4.0–10.5)

## 2018-02-08 LAB — BASIC METABOLIC PANEL
BUN: 11 mg/dL (ref 6–23)
CALCIUM: 9.8 mg/dL (ref 8.4–10.5)
CO2: 27 mEq/L (ref 19–32)
Chloride: 105 mEq/L (ref 96–112)
Creatinine, Ser: 0.81 mg/dL (ref 0.40–1.20)
GFR: 71.83 mL/min (ref >60.00)
Glucose, Bld: 160 mg/dL — ABNORMAL HIGH (ref 70–99)
Potassium: 4.2 mEq/L (ref 3.5–5.1)
SODIUM: 141 meq/L (ref 135–145)

## 2018-02-08 LAB — LIPID PANEL
CHOL/HDL RATIO: 4
Cholesterol: 190 mg/dL (ref 0–200)
HDL: 54 mg/dL (ref >39.00)
LDL Cholesterol: 99 mg/dL (ref 0–99)
NONHDL: 136.31
Triglycerides: 187 mg/dL — ABNORMAL HIGH (ref 0.0–149.0)
VLDL: 37.4 mg/dL (ref 0.0–40.0)

## 2018-02-08 LAB — HEPATIC FUNCTION PANEL
ALT: 12 U/L (ref 0–35)
AST: 13 U/L (ref 0–37)
Albumin: 4.4 g/dL (ref 3.5–5.2)
Alkaline Phosphatase: 50 U/L (ref 39–117)
Bilirubin, Direct: 0.1 mg/dL (ref 0.0–0.3)
Total Bilirubin: 0.5 mg/dL (ref 0.2–1.2)
Total Protein: 7.3 g/dL (ref 6.0–8.3)

## 2018-02-08 LAB — HEMOGLOBIN A1C: HEMOGLOBIN A1C: 6.8 % — AB (ref 4.6–6.5)

## 2018-02-14 ENCOUNTER — Other Ambulatory Visit: Payer: Self-pay | Admitting: Internal Medicine

## 2018-02-23 DIAGNOSIS — M810 Age-related osteoporosis without current pathological fracture: Secondary | ICD-10-CM | POA: Diagnosis not present

## 2018-02-23 DIAGNOSIS — E559 Vitamin D deficiency, unspecified: Secondary | ICD-10-CM | POA: Diagnosis not present

## 2018-03-02 DIAGNOSIS — M810 Age-related osteoporosis without current pathological fracture: Secondary | ICD-10-CM | POA: Diagnosis not present

## 2018-03-02 DIAGNOSIS — E559 Vitamin D deficiency, unspecified: Secondary | ICD-10-CM | POA: Diagnosis not present

## 2018-03-14 DIAGNOSIS — M1711 Unilateral primary osteoarthritis, right knee: Secondary | ICD-10-CM | POA: Diagnosis not present

## 2018-03-14 DIAGNOSIS — Z7952 Long term (current) use of systemic steroids: Secondary | ICD-10-CM | POA: Diagnosis not present

## 2018-03-14 DIAGNOSIS — M353 Polymyalgia rheumatica: Secondary | ICD-10-CM | POA: Diagnosis not present

## 2018-04-02 DIAGNOSIS — J40 Bronchitis, not specified as acute or chronic: Secondary | ICD-10-CM | POA: Diagnosis not present

## 2018-04-17 DIAGNOSIS — R05 Cough: Secondary | ICD-10-CM | POA: Diagnosis not present

## 2018-04-25 DIAGNOSIS — M353 Polymyalgia rheumatica: Secondary | ICD-10-CM | POA: Diagnosis not present

## 2018-05-06 DIAGNOSIS — M542 Cervicalgia: Secondary | ICD-10-CM | POA: Diagnosis not present

## 2018-05-09 ENCOUNTER — Other Ambulatory Visit: Payer: Self-pay | Admitting: Internal Medicine

## 2018-05-24 ENCOUNTER — Ambulatory Visit (INDEPENDENT_AMBULATORY_CARE_PROVIDER_SITE_OTHER): Payer: Medicare Other | Admitting: Internal Medicine

## 2018-05-24 VITALS — BP 130/70 | HR 90 | Temp 98.5°F | Resp 16 | Wt 124.4 lb

## 2018-05-24 DIAGNOSIS — R413 Other amnesia: Secondary | ICD-10-CM

## 2018-05-24 DIAGNOSIS — R634 Abnormal weight loss: Secondary | ICD-10-CM

## 2018-05-24 DIAGNOSIS — M25572 Pain in left ankle and joints of left foot: Secondary | ICD-10-CM

## 2018-05-24 DIAGNOSIS — E7800 Pure hypercholesterolemia, unspecified: Secondary | ICD-10-CM | POA: Diagnosis not present

## 2018-05-24 DIAGNOSIS — M810 Age-related osteoporosis without current pathological fracture: Secondary | ICD-10-CM | POA: Diagnosis not present

## 2018-05-24 DIAGNOSIS — I10 Essential (primary) hypertension: Secondary | ICD-10-CM | POA: Diagnosis not present

## 2018-05-24 DIAGNOSIS — E119 Type 2 diabetes mellitus without complications: Secondary | ICD-10-CM | POA: Diagnosis not present

## 2018-05-24 NOTE — Progress Notes (Signed)
[name], female   DOB: 1933/08/16, 84 y.o.   MRN: [id number]   Subjective:    [name], female    DOB: 1933/09/21, 84 y.o.   MRN: [id number]  HPI  Patient here for a scheduled follow up.  She is accompanied by her daughter.  History obtained from both of them.  Ms Schemm reports she is doing relatively well.  Breathing stable.  No increased cough or congestion.  No sob.  No acid reflux.  No abdominal pain.  Bowels moving. No chest pain.  Does report increased left ankle/pain.  Some minimal swelling.  Improved.  No known injury or trauma.  No increased erythema.  No swelling extending up the leg.  Sugars doing well.     Past Medical History:  Diagnosis Date  . Asthma   . Diabetes mellitus ([place])   . Hypercholesterolemia   . Hypertension   . Osteopenia   . Reactive airway disease    Past Surgical History:  Procedure Laterality Date  . ABDOMINAL HYSTERECTOMY  1981   prolapse and bleeding, ovaries not removed  . BREAST EXCISIONAL BIOPSY Right   . UMBILICAL HERNIA REPAIR  7/94   Family History  Problem Relation Age of Onset  . Heart attack Father   . Arthritis Mother   . Heart disease Mother   . Throat cancer Sister    Social History   Socioeconomic History  . Marital status: Widowed    Spouse name: Not on file  . Number of children: 3  . Years of education: Not on file  . Highest education level: Not on file  Occupational History  . Not on file  Social Needs  . Financial resource strain: Not hard at all  . Food insecurity:    Worry: Never true    Inability: Never true  . Transportation needs:    Medical: No    Non-medical: No  Tobacco Use  . Smoking status: Never Smoker  . Smokeless tobacco: Never Used  Substance and Sexual Activity  . Alcohol use: No    Alcohol/week: 0.0 oz  . Drug use: No  . Sexual activity: Never  Lifestyle  . Physical activity:    Days per week: Not on file    Minutes per session: Not on file  .  Stress: Not on file  Relationships  . Social connections:    Talks on phone: Not on file    Gets together: Not on file    Attends religious service: Not on file    Active member of club or organization: Not on file    Attends meetings of clubs or organizations: Not on file    Relationship status: Not on file  Other Topics Concern  . Not on file  Social History Narrative  . Not on file    Outpatient Encounter Medications as of 05/24/2018  Medication Sig  . ADVAIR DISKUS 250-50 MCG/DOSE AEPB INHALE ONE PUFF BY MOUTH EVERY 12 HOURS. RINSE MOUTH AFTER EACH USE  . alendronate (FOSAMAX) 70 MG tablet   . aspirin 81 MG tablet Take 81 mg by mouth daily.  . calcium carbonate (OS-CAL) 600 MG TABS Take 600 mg by mouth daily.  . fluticasone (FLONASE) 50 MCG/ACT nasal spray USE TWO SPRAY(S) IN EACH NOSTRIL ONCE DAILY  . lisinopril (PRINIVIL,ZESTRIL) 20 MG tablet Take 20 mg by mouth daily.  . metFORMIN (GLUCOPHAGE) 500 MG tablet TAKE 1 TABLET BY MOUTH TWICE DAILY WITH A MEAL  .  Multiple Vitamins-Minerals (VISION FORMULA/LUTEIN PO) Take by mouth daily.  . mupirocin ointment (BACTROBAN) 2 % Apply 1 application topically 2 (two) times daily.  . predniSONE (DELTASONE) 5 MG tablet Take 2 tabs and a half (12.5 mg ) for 30 days, start 08/04  . simvastatin (ZOCOR) 10 MG tablet TAKE 1 TABLET BY MOUTH AT BEDTIME   No facility-administered encounter medications on file as of 05/24/2018.     Review of Systems  Constitutional: Negative for appetite change and unexpected weight change.  HENT: Negative for congestion and sinus pressure.   Respiratory: Negative for cough, chest tightness and shortness of breath.   Cardiovascular: Negative for chest pain, palpitations and leg swelling.  Gastrointestinal: Negative for abdominal pain, diarrhea, nausea and vomiting.  Genitourinary: Negative for difficulty urinating and dysuria.  Musculoskeletal: Negative for joint swelling and myalgias.       Increased pain left  ankle/foot.  Minimal soft tissue swelling.  No increased erythema.    Skin: Negative for color change and rash.  Neurological: Negative for dizziness, light-headedness and headaches.  Psychiatric/Behavioral: Negative for agitation and dysphoric mood.       Objective:    Physical Exam  Constitutional: She appears well-developed and well-nourished. No distress.  HENT:  Nose: Nose normal.  Mouth/Throat: Oropharynx is clear and moist.  Neck: Neck supple. No thyromegaly present.  Cardiovascular: Normal rate and regular rhythm.  Pulmonary/Chest: Breath sounds normal. No respiratory distress. She has no wheezes.  Abdominal: Soft. Bowel sounds are normal. There is no tenderness.  Musculoskeletal: She exhibits no edema.  Minimal soft tissue swelling - left ankle.  Minimal tenderness to palpation.  No pain - top of foot or extending up leg.  No increased erythema.  No increased swelling up leg.    Lymphadenopathy:    She has no cervical adenopathy.  Skin: No rash noted. No erythema.  Psychiatric: She has a normal mood and affect. Her behavior is normal.    BP 130/70 (BP Location: Left Arm, Patient Position: Sitting, Cuff Size: Normal)   Pulse 90   Temp 98.5 F (36.9 C) (Oral)   Resp 16   Wt 124 lb 6.4 oz (56.4 kg)   SpO2 95%   BMI 23.90 kg/m  Wt Readings from Last 3 Encounters:  05/24/18 124 lb 6.4 oz (56.4 kg)  01/30/18 129 lb (58.5 kg)  01/03/18 128 lb 6.4 oz (58.2 kg)     Lab Results  Component Value Date   WBC 9.9 02/08/2018   HGB 14.4 02/08/2018   HCT 42.6 02/08/2018   PLT 274.0 02/08/2018   GLUCOSE 160 (H) 02/08/2018   CHOL 190 02/08/2018   TRIG 187.0 (H) 02/08/2018   HDL 54.00 02/08/2018   LDLDIRECT 85.0 06/15/2017   LDLCALC 99 02/08/2018   ALT 12 02/08/2018   AST 13 02/08/2018   NA 141 02/08/2018   K 4.2 02/08/2018   CL 105 02/08/2018   CREATININE 0.81 02/08/2018   BUN 11 02/08/2018   CO2 27 02/08/2018   TSH 1.56 06/15/2017   HGBA1C 6.8 (H) 02/08/2018    MICROALBUR 2.3 (H) 06/15/2017    Mm Screening Breast Tomo Bilateral  Result Date: 01/24/2018 CLINICAL DATA:  Screening. EXAM: DIGITAL SCREENING BILATERAL MAMMOGRAM WITH TOMO AND CAD COMPARISON:  Previous exam(s). ACR Breast Density Category b: There are scattered areas of fibroglandular density. FINDINGS: There are no findings suspicious for malignancy. Images were processed with CAD. IMPRESSION: No mammographic evidence of malignancy. A result letter of this screening mammogram will be  mailed directly to the patient. RECOMMENDATION: Screening mammogram in one year. (Code:SM-B-01Y) BI-RADS CATEGORY  1: Negative. Electronically Signed   By: Nolon Nations M.D.   On: 01/24/2018 13:51       Assessment & Plan:   Problem List Items Addressed This Visit    Diabetes mellitus ([place])    Low carb diet and exercise.  Follow met b and a1c.        Relevant Orders   Hemoglobin A1c   Hypercholesterolemia    On simvastatin.  Low cholesterol diet and exercise.  Follow lipid panel and liver function tests.        Relevant Orders   Hepatic function panel   Lipid panel   Hypertension    Blood pressure under good control.  Continue same medication regimen.  Follow pressures.  Follow metabolic panel.        Relevant Orders   Basic metabolic panel   Memory change    Daughter reports noticing some memory change.  Mostly with short term memory.  She still controls her finances, etc.  Does well with these things.  Discussed further evaluation and w/up.  Will start with labs.  Check thyroid and B12.  Wants to hold on any further w/up at this time.  Follow.        Relevant Orders   Vitamin B12   Osteoporosis    On fosamax.  Follow.        Relevant Orders   VITAMIN D 25 Hydroxy (Vit-D Deficiency, Fractures)   Weight loss    Weight down several more pounds.  States she is eating well.  Tying to watch her sugars.  No abdominal pain.  No nausea or vomiting.  Follow weight.        Relevant Orders    TSH    Other Visit Diagnoses    Left ankle pain, unspecified chronicity    -  Primary   Pain in left foot and ankle.  Check xray.  May need referral to podiatry given persistence.     Relevant Orders   DG Ankle 2 Views Left (Completed)       Einar Pheasant, MD

## 2018-05-25 ENCOUNTER — Ambulatory Visit (INDEPENDENT_AMBULATORY_CARE_PROVIDER_SITE_OTHER): Payer: Medicare Other

## 2018-05-25 DIAGNOSIS — M7989 Other specified soft tissue disorders: Secondary | ICD-10-CM | POA: Diagnosis not present

## 2018-05-25 DIAGNOSIS — M25572 Pain in left ankle and joints of left foot: Secondary | ICD-10-CM

## 2018-05-27 ENCOUNTER — Encounter: Payer: Self-pay | Admitting: Internal Medicine

## 2018-05-27 DIAGNOSIS — R413 Other amnesia: Secondary | ICD-10-CM | POA: Insufficient documentation

## 2018-05-27 NOTE — Assessment & Plan Note (Signed)
On simvastatin.  Low cholesterol diet and exercise.  Follow lipid panel and liver function tests.   

## 2018-05-27 NOTE — Assessment & Plan Note (Signed)
Low carb diet and exercise.  Follow met b and a1c.   

## 2018-05-27 NOTE — Assessment & Plan Note (Signed)
Daughter reports noticing some memory change.  Mostly with short term memory.  She still controls her finances, etc.  Does well with these things.  Discussed further evaluation and w/up.  Will start with labs.  Check thyroid and B12.  Wants to hold on any further w/up at this time.  Follow.

## 2018-05-27 NOTE — Assessment & Plan Note (Signed)
Blood pressure under good control.  Continue same medication regimen.  Follow pressures.  Follow metabolic panel.   

## 2018-05-27 NOTE — Assessment & Plan Note (Signed)
Weight down several more pounds.  States she is eating well.  Tying to watch her sugars.  No abdominal pain.  No nausea or vomiting.  Follow weight.

## 2018-05-27 NOTE — Assessment & Plan Note (Signed)
On fosamax.  Follow  

## 2018-05-28 ENCOUNTER — Other Ambulatory Visit: Payer: Self-pay | Admitting: Internal Medicine

## 2018-05-28 DIAGNOSIS — M25572 Pain in left ankle and joints of left foot: Secondary | ICD-10-CM

## 2018-05-28 NOTE — Progress Notes (Signed)
Order placed for podiatry referral.   

## 2018-05-31 ENCOUNTER — Other Ambulatory Visit (INDEPENDENT_AMBULATORY_CARE_PROVIDER_SITE_OTHER): Payer: Medicare Other

## 2018-05-31 DIAGNOSIS — E119 Type 2 diabetes mellitus without complications: Secondary | ICD-10-CM | POA: Diagnosis not present

## 2018-05-31 DIAGNOSIS — I10 Essential (primary) hypertension: Secondary | ICD-10-CM | POA: Diagnosis not present

## 2018-05-31 DIAGNOSIS — M810 Age-related osteoporosis without current pathological fracture: Secondary | ICD-10-CM

## 2018-05-31 DIAGNOSIS — R634 Abnormal weight loss: Secondary | ICD-10-CM

## 2018-05-31 DIAGNOSIS — R413 Other amnesia: Secondary | ICD-10-CM

## 2018-05-31 DIAGNOSIS — E7800 Pure hypercholesterolemia, unspecified: Secondary | ICD-10-CM | POA: Diagnosis not present

## 2018-05-31 LAB — HEPATIC FUNCTION PANEL
ALBUMIN: 4.4 g/dL (ref 3.5–5.2)
ALK PHOS: 57 U/L (ref 39–117)
ALT: 12 U/L (ref 0–35)
AST: 14 U/L (ref 0–37)
BILIRUBIN DIRECT: 0.1 mg/dL (ref 0.0–0.3)
TOTAL PROTEIN: 7.6 g/dL (ref 6.0–8.3)
Total Bilirubin: 0.5 mg/dL (ref 0.2–1.2)

## 2018-05-31 LAB — VITAMIN B12: Vitamin B-12: 291 pg/mL (ref 211–911)

## 2018-05-31 LAB — BASIC METABOLIC PANEL
BUN: 14 mg/dL (ref 6–23)
CALCIUM: 9.5 mg/dL (ref 8.4–10.5)
CO2: 26 mEq/L (ref 19–32)
Chloride: 105 mEq/L (ref 96–112)
Creatinine, Ser: 0.76 mg/dL (ref 0.40–1.20)
GFR: 77.26 mL/min (ref >60.00)
GLUCOSE: 150 mg/dL — AB (ref 70–99)
POTASSIUM: 4.7 meq/L (ref 3.5–5.1)
SODIUM: 141 meq/L (ref 135–145)

## 2018-05-31 LAB — LIPID PANEL
CHOL/HDL RATIO: 3
CHOLESTEROL: 166 mg/dL (ref 0–200)
HDL: 50.3 mg/dL (ref >39.00)
LDL Cholesterol: 79 mg/dL (ref 0–99)
NonHDL: 115.43
TRIGLYCERIDES: 181 mg/dL — AB (ref 0.0–149.0)
VLDL: 36.2 mg/dL (ref 0.0–40.0)

## 2018-05-31 LAB — HEMOGLOBIN A1C: Hgb A1c MFr Bld: 7 % — ABNORMAL HIGH (ref 4.6–6.5)

## 2018-05-31 LAB — TSH: TSH: 2.62 u[IU]/mL (ref 0.35–4.50)

## 2018-05-31 LAB — VITAMIN D 25 HYDROXY (VIT D DEFICIENCY, FRACTURES): VITD: 49.25 ng/mL (ref 30.00–100.00)

## 2018-06-05 ENCOUNTER — Ambulatory Visit (INDEPENDENT_AMBULATORY_CARE_PROVIDER_SITE_OTHER): Payer: Medicare Other

## 2018-06-05 DIAGNOSIS — E538 Deficiency of other specified B group vitamins: Secondary | ICD-10-CM

## 2018-06-05 MED ORDER — CYANOCOBALAMIN 1000 MCG/ML IJ SOLN
1000.0000 ug | Freq: Once | INTRAMUSCULAR | Status: AC
  Administered 2018-06-05: 1000 ug via INTRAMUSCULAR

## 2018-06-05 NOTE — Progress Notes (Addendum)
Patient comes in for B 12 injection.  Injected left deltoid.  Patient tolerated injection well.   Reviewed.  Dr Scott 

## 2018-06-12 ENCOUNTER — Ambulatory Visit (INDEPENDENT_AMBULATORY_CARE_PROVIDER_SITE_OTHER): Payer: Medicare Other

## 2018-06-12 DIAGNOSIS — E538 Deficiency of other specified B group vitamins: Secondary | ICD-10-CM | POA: Diagnosis not present

## 2018-06-12 MED ORDER — CYANOCOBALAMIN 1000 MCG/ML IJ SOLN
1000.0000 ug | Freq: Once | INTRAMUSCULAR | Status: AC
  Administered 2018-06-12: 1000 ug via INTRAMUSCULAR

## 2018-06-12 NOTE — Progress Notes (Addendum)
b12 injection given in right deltoid patient tolerated well.   Reviewed.  Dr Lorin Picket

## 2018-06-14 DIAGNOSIS — Z7952 Long term (current) use of systemic steroids: Secondary | ICD-10-CM | POA: Diagnosis not present

## 2018-06-14 DIAGNOSIS — M353 Polymyalgia rheumatica: Secondary | ICD-10-CM | POA: Diagnosis not present

## 2018-06-14 DIAGNOSIS — M810 Age-related osteoporosis without current pathological fracture: Secondary | ICD-10-CM | POA: Diagnosis not present

## 2018-06-20 ENCOUNTER — Ambulatory Visit (INDEPENDENT_AMBULATORY_CARE_PROVIDER_SITE_OTHER): Payer: Medicare Other

## 2018-06-20 DIAGNOSIS — E538 Deficiency of other specified B group vitamins: Secondary | ICD-10-CM

## 2018-06-20 MED ORDER — CYANOCOBALAMIN 1000 MCG/ML IJ SOLN
1000.0000 ug | Freq: Once | INTRAMUSCULAR | Status: AC
  Administered 2018-06-20: 1000 ug via INTRAMUSCULAR

## 2018-06-20 NOTE — Progress Notes (Signed)
Patient presented for B 12 injection to left deltoid, patient voiced no concerns nor showed any signs of distress during injection. 

## 2018-06-21 NOTE — Progress Notes (Signed)
  I have reviewed the above information and agree with above.   Shamere Campas, MD 

## 2018-06-26 ENCOUNTER — Ambulatory Visit: Payer: Medicare Other

## 2018-06-26 ENCOUNTER — Encounter: Payer: Self-pay | Admitting: Podiatry

## 2018-06-26 ENCOUNTER — Ambulatory Visit (INDEPENDENT_AMBULATORY_CARE_PROVIDER_SITE_OTHER): Payer: Medicare Other | Admitting: Podiatry

## 2018-06-26 ENCOUNTER — Other Ambulatory Visit: Payer: Self-pay

## 2018-06-26 DIAGNOSIS — M659 Synovitis and tenosynovitis, unspecified: Secondary | ICD-10-CM | POA: Diagnosis not present

## 2018-06-27 ENCOUNTER — Ambulatory Visit (INDEPENDENT_AMBULATORY_CARE_PROVIDER_SITE_OTHER): Payer: Medicare Other | Admitting: *Deleted

## 2018-06-27 DIAGNOSIS — E538 Deficiency of other specified B group vitamins: Secondary | ICD-10-CM | POA: Diagnosis not present

## 2018-06-27 MED ORDER — CYANOCOBALAMIN 1000 MCG/ML IJ SOLN
1000.0000 ug | Freq: Once | INTRAMUSCULAR | Status: AC
  Administered 2018-06-27: 1000 ug via INTRAMUSCULAR

## 2018-06-27 NOTE — Progress Notes (Signed)
   Subjective:  84 year old female presenting today as a new patient with a chief complaint of intermittent soreness of the left ankle that began one month ago. She reports associated swelling of the area. She states her PCP told her she had bone spurs in the ankle. Walking increases the pain. She has been using heat and ice therapy, taking Ibuprofen and Tylenol Arthritis for treatment. Patient is here for further evaluation and treatment.   Past Medical History:  Diagnosis Date  . Asthma   . Diabetes mellitus ([facility])   . Hypercholesterolemia   . Hypertension   . Osteopenia   . Reactive airway disease      Objective / Physical Exam:  General:  The patient is alert and oriented x3 in no acute distress. Dermatology:  Skin is warm, dry and supple bilateral lower extremities. Negative for open lesions or macerations. Vascular:  Palpable pedal pulses bilaterally. No edema or erythema noted. Capillary refill within normal limits. Neurological:  Epicritic and protective threshold grossly intact bilaterally.  Musculoskeletal Exam:  Pain on palpation to the anterior lateral medial aspects of the patient's left ankle. Mild edema noted. Range of motion within normal limits to all pedal and ankle joints bilateral. Muscle strength 5/5 in all groups bilateral.    Assessment: 1. Left ankle synovitis/DJD  Plan of Care:  1. Patient was evaluated. 2. injection of 0.5 mL Celestone Soluspan injected in the patient's left ankle. 3. Continue taking OTC Tylenol as needed.  4. Compression anklet dispensed.  5. Return to clinic as needed.    Felecia Shelling, DPM [facility]  Dr. Felecia Shelling, DPM    [address]                                        [place], [place] [address]                Office [phone]  Fax [phone]

## 2018-06-27 NOTE — Progress Notes (Signed)
Patient presented for B 12 injection to right deltoid, patient voiced no concerns nor showed any signs of distress during injection. 

## 2018-07-16 DIAGNOSIS — J069 Acute upper respiratory infection, unspecified: Secondary | ICD-10-CM | POA: Diagnosis not present

## 2018-07-16 DIAGNOSIS — R05 Cough: Secondary | ICD-10-CM | POA: Diagnosis not present

## 2018-07-31 ENCOUNTER — Ambulatory Visit: Payer: Self-pay

## 2018-07-31 DIAGNOSIS — R05 Cough: Secondary | ICD-10-CM | POA: Diagnosis not present

## 2018-08-06 ENCOUNTER — Ambulatory Visit (INDEPENDENT_AMBULATORY_CARE_PROVIDER_SITE_OTHER): Payer: Medicare Other | Admitting: Lab

## 2018-08-06 DIAGNOSIS — E538 Deficiency of other specified B group vitamins: Secondary | ICD-10-CM

## 2018-08-06 MED ORDER — CYANOCOBALAMIN 1000 MCG/ML IJ SOLN
1000.0000 ug | Freq: Once | INTRAMUSCULAR | Status: AC
  Administered 2018-08-06: 1000 ug via INTRAMUSCULAR

## 2018-08-06 NOTE — Progress Notes (Signed)
B12 injection given in R-arm, Pt tolerated well.

## 2018-08-09 NOTE — Progress Notes (Addendum)
Dr. Lorin Picket out of office mondayy would you sign B 12?   I have reviewed the above information and agree with above.   Duncan Dull, MD

## 2018-08-25 ENCOUNTER — Other Ambulatory Visit: Payer: Self-pay | Admitting: Internal Medicine

## 2018-09-06 ENCOUNTER — Ambulatory Visit (INDEPENDENT_AMBULATORY_CARE_PROVIDER_SITE_OTHER): Payer: Medicare Other | Admitting: Internal Medicine

## 2018-09-06 ENCOUNTER — Encounter: Payer: Self-pay | Admitting: Internal Medicine

## 2018-09-06 DIAGNOSIS — I10 Essential (primary) hypertension: Secondary | ICD-10-CM

## 2018-09-06 DIAGNOSIS — E7800 Pure hypercholesterolemia, unspecified: Secondary | ICD-10-CM

## 2018-09-06 DIAGNOSIS — Z0000 Encounter for general adult medical examination without abnormal findings: Secondary | ICD-10-CM

## 2018-09-06 DIAGNOSIS — Z0001 Encounter for general adult medical examination with abnormal findings: Secondary | ICD-10-CM

## 2018-09-06 DIAGNOSIS — J4520 Mild intermittent asthma, uncomplicated: Secondary | ICD-10-CM

## 2018-09-06 DIAGNOSIS — E119 Type 2 diabetes mellitus without complications: Secondary | ICD-10-CM

## 2018-09-06 DIAGNOSIS — R634 Abnormal weight loss: Secondary | ICD-10-CM

## 2018-09-06 DIAGNOSIS — R413 Other amnesia: Secondary | ICD-10-CM

## 2018-09-06 MED ORDER — PREDNISONE 10 MG PO TABS: ORAL_TABLET | ORAL | 0 refills | Status: DC

## 2018-09-06 MED ORDER — CEFDINIR 300 MG PO CAPS: 300.0000 mg | ORAL_CAPSULE | Freq: Two times a day (BID) | ORAL | 0 refills | Status: DC

## 2018-09-06 NOTE — Progress Notes (Signed)
[name], female   DOB: 1933/10/27, 84 y.o.   MRN: [id number]   Subjective:    [name], female    DOB: 1933-08-14, 84 y.o.   MRN: [id number]  HPI  Patient here for her physical exam.  She is accompanied by her daughter.  History obtained from both of them.  Reports persistent cough and congestion.  Was evaluated 07/31/18 - acute care.  Treated with augmentin and prednisone.  Symptoms resolved.  States started back one week ago with increased cough and congestion.  Minimal sinus pressure.  Some drainage.  Coughing fits.  Used flonase.  No acid reflux.  Off prednisone - for PMR.  Shoulders doing well.  No chest pain.  No abdominal pain.  Bowels moving.  Sugars doing well.  States am sugars 112 (2 hours after eating).  No nausea or vomiting.  No fever.  Discussed memory issues.  Pt denies. Daughter wants to monitor.     Past Medical History:  Diagnosis Date  . Asthma   . Diabetes mellitus ([place])   . Hypercholesterolemia   . Hypertension   . Osteopenia   . Reactive airway disease    Past Surgical History:  Procedure Laterality Date  . ABDOMINAL HYSTERECTOMY  1981   prolapse and bleeding, ovaries not removed  . BREAST EXCISIONAL BIOPSY Right   . UMBILICAL HERNIA REPAIR  7/94   Family History  Problem Relation Age of Onset  . Heart attack Father   . Arthritis Mother   . Heart disease Mother   . Throat cancer Sister    Social History   Socioeconomic History  . Marital status: Widowed    Spouse name: Not on file  . Number of children: 3  . Years of education: Not on file  . Highest education level: Not on file  Occupational History  . Not on file  Social Needs  . Financial resource strain: Not hard at all  . Food insecurity:    Worry: Never true    Inability: Never true  . Transportation needs:    Medical: No    Non-medical: No  Tobacco Use  . Smoking status: Never Smoker  . Smokeless tobacco: Never Used  Substance and Sexual  Activity  . Alcohol use: No    Alcohol/week: 0.0 standard drinks  . Drug use: No  . Sexual activity: Never  Lifestyle  . Physical activity:    Days per week: Not on file    Minutes per session: Not on file  . Stress: Not on file  Relationships  . Social connections:    Talks on phone: Not on file    Gets together: Not on file    Attends religious service: Not on file    Active member of club or organization: Not on file    Attends meetings of clubs or organizations: Not on file    Relationship status: Not on file  Other Topics Concern  . Not on file  Social History Narrative  . Not on file    Outpatient Encounter Medications as of 09/06/2018  Medication Sig  . ADVAIR DISKUS 250-50 MCG/DOSE AEPB INHALE ONE PUFF BY MOUTH EVERY 12 HOURS. RINSE MOUTH AFTER EACH USE  . alendronate (FOSAMAX) 70 MG tablet   . aspirin 81 MG tablet Take 81 mg by mouth daily.  . calcium carbonate (OS-CAL) 600 MG TABS Take 600 mg by mouth daily.  . cefdinir (OMNICEF) 300 MG capsule Take 1 capsule (300 mg  total) by mouth 2 (two) times daily.  . Cholecalciferol (VITAMIN D-1000 MAX ST) 1000 units tablet Take by mouth.  . cyanocobalamin (,VITAMIN B-12,) 1000 MCG/ML injection Inject into the muscle.  . fluticasone (FLONASE) 50 MCG/ACT nasal spray USE 2 SPRAY(S) IN EACH NOSTRIL ONCE DAILY  . lisinopril (PRINIVIL,ZESTRIL) 20 MG tablet Take 20 mg by mouth daily.  Marland Kitchen lisinopril-hydrochlorothiazide (PRINZIDE,ZESTORETIC) 20-12.5 MG tablet lisinopril 20 mg-hydrochlorothiazide 12.5 mg tablet  . metFORMIN (GLUCOPHAGE) 500 MG tablet TAKE 1 TABLET BY MOUTH TWICE DAILY WITH A MEAL  . Multiple Vitamins-Minerals (VISION FORMULA/LUTEIN PO) Take by mouth daily.  . mupirocin ointment (BACTROBAN) 2 % Apply 1 application topically 2 (two) times daily.  . predniSONE (DELTASONE) 10 MG tablet Take 4 tablets x 1 day and then decrease by 1/2 tablet per day until down to zero mg.  . simvastatin (ZOCOR) 10 MG tablet TAKE 1 TABLET BY  MOUTH AT BEDTIME  . Vitamin D, Ergocalciferol, (DRISDOL) 50000 units CAPS capsule Take 50,000 Units by mouth once a week.  . [DISCONTINUED] predniSONE (DELTASONE) 5 MG tablet Take 2 tabs and a half (12.5 mg ) for 30 days, start 08/04   No facility-administered encounter medications on file as of 09/06/2018.     Review of Systems  Constitutional: Negative for appetite change and unexpected weight change.  HENT: Positive for congestion. Negative for sinus pressure.   Eyes: Negative for pain and visual disturbance.  Respiratory: Positive for cough. Negative for chest tightness and shortness of breath.        Some occasional wheezing.    Cardiovascular: Negative for chest pain, palpitations and leg swelling.  Gastrointestinal: Negative for abdominal pain, diarrhea, nausea and vomiting.  Genitourinary: Negative for difficulty urinating and dysuria.  Musculoskeletal: Negative for joint swelling and myalgias.       Shoulders better.    Skin: Negative for color change and rash.  Neurological: Negative for dizziness, light-headedness and headaches.  Hematological: Negative for adenopathy. Does not bruise/bleed easily.  Psychiatric/Behavioral: Negative for agitation and dysphoric mood.       Objective:    Physical Exam  Constitutional: She is oriented to person, place, and time. She appears well-developed and well-nourished. No distress.  HENT:  Nose: Nose normal.  Mouth/Throat: Oropharynx is clear and moist.  Eyes: Right eye exhibits no discharge. Left eye exhibits no discharge. No scleral icterus.  Neck: Neck supple. No thyromegaly present.  Cardiovascular: Normal rate and regular rhythm.  Pulmonary/Chest: Breath sounds normal. No accessory muscle usage. No tachypnea. No respiratory distress. She has no decreased breath sounds. She has no wheezes. She has no rhonchi. Right breast exhibits no inverted nipple, no mass, no nipple discharge and no tenderness (no axillary adenopathy). Left  breast exhibits no inverted nipple, no mass, no nipple discharge and no tenderness (no axilarry adenopathy).  Abdominal: Soft. Bowel sounds are normal. There is no tenderness.  Musculoskeletal: She exhibits no edema or tenderness.  Lymphadenopathy:    She has no cervical adenopathy.  Neurological: She is alert and oriented to person, place, and time.  Skin: No rash noted. No erythema.  Psychiatric: She has a normal mood and affect. Her behavior is normal.    BP 124/68 (BP Location: Left Arm, Patient Position: Sitting, Cuff Size: Normal)   Pulse 75   Temp 98.7 F (37.1 C) (Oral)   Resp 18   Ht _0  (1.549 m)   Wt 119 lb 9.6 oz (54.3 kg)   SpO2 95%   BMI 22.60 kg/m  Wt Readings from Last 3 Encounters:  09/06/18 119 lb 9.6 oz (54.3 kg)  05/24/18 124 lb 6.4 oz (56.4 kg)  01/30/18 129 lb (58.5 kg)     Lab Results  Component Value Date   WBC 9.9 02/08/2018   HGB 14.4 02/08/2018   HCT 42.6 02/08/2018   PLT 274.0 02/08/2018   GLUCOSE 150 (H) 05/31/2018   CHOL 166 05/31/2018   TRIG 181.0 (H) 05/31/2018   HDL 50.30 05/31/2018   LDLDIRECT 85.0 06/15/2017   LDLCALC 79 05/31/2018   ALT 12 05/31/2018   AST 14 05/31/2018   NA 141 05/31/2018   K 4.7 05/31/2018   CL 105 05/31/2018   CREATININE 0.76 05/31/2018   BUN 14 05/31/2018   CO2 26 05/31/2018   TSH 2.62 05/31/2018   HGBA1C 7.0 (H) 05/31/2018   MICROALBUR 2.3 (H) 06/15/2017    Mm Screening Breast Tomo Bilateral  Result Date: 01/24/2018 CLINICAL DATA:  Screening. EXAM: DIGITAL SCREENING BILATERAL MAMMOGRAM WITH TOMO AND CAD COMPARISON:  Previous exam(s). ACR Breast Density Category b: There are scattered areas of fibroglandular density. FINDINGS: There are no findings suspicious for malignancy. Images were processed with CAD. IMPRESSION: No mammographic evidence of malignancy. A result letter of this screening mammogram will be mailed directly to the patient. RECOMMENDATION: Screening mammogram in one year. (Code:SM-B-01Y)  BI-RADS CATEGORY  1: Negative. Electronically Signed   By: Nolon Nations M.D.   On: 01/24/2018 13:51       Assessment & Plan:   Problem List Items Addressed This Visit    Diabetes mellitus ([place])    Low carb diet and exercise.  Discussed prednisone may increased sugars.  Follow met b and a1c.        Health care maintenance    Physical today 09/06/18.  Mammogram 01/24/18 - Birads I.  cologuard 02/09/16 - negative.        Hypercholesterolemia    On simvastatin.  Low cholesterol diet and exercise.  Follow lipid panel and liver function tests.        Hypertension    Blood pressure under good control.  Continue same medication regimen.  Follow pressures.  Follow metabolic panel.        Memory change    Pt denies any issues.  Daughter wants to monitor.  Follow.        Reactive airway disease    With increased cough and congestion as outlined.  Treat with omnicef and prednisone as directed.  Continue inhalers.  Continue flonase and saline nasal spray as directed.  Mucinex/robitussin as directed.  Follow.        Weight loss    Weight is down several more pounds.  States she is eating.  Trying to watch her sugars.  Follow weight.  No abdominal pain.  Bowels moving.  Follow closely.            Einar Pheasant, MD

## 2018-09-06 NOTE — Patient Instructions (Signed)
Take the prednisone taper and antibiotics as directed.    nasacort nasal spray - 2 sprays each nostril one time per day.  Do this in the evening.     Take a probiotic daily while you are on the antibiotics and for two weeks after completing the antibiotics.    Examples of probiotics:  Culturelle, align or florastor.

## 2018-09-09 ENCOUNTER — Encounter: Payer: Self-pay | Admitting: Internal Medicine

## 2018-09-09 NOTE — Assessment & Plan Note (Signed)
Physical today 09/06/18.  Mammogram 01/24/18 - Birads I.  cologuard 02/09/16 - negative.

## 2018-09-09 NOTE — Assessment & Plan Note (Signed)
With increased cough and congestion as outlined.  Treat with omnicef and prednisone as directed.  Continue inhalers.  Continue flonase and saline nasal spray as directed.  Mucinex/robitussin as directed.  Follow.

## 2018-09-09 NOTE — Assessment & Plan Note (Signed)
Weight is down several more pounds.  States she is eating.  Trying to watch her sugars.  Follow weight.  No abdominal pain.  Bowels moving.  Follow closely.

## 2018-09-09 NOTE — Assessment & Plan Note (Signed)
Pt denies any issues.  Daughter wants to monitor.  Follow.

## 2018-09-09 NOTE — Assessment & Plan Note (Signed)
Low carb diet and exercise.  Discussed prednisone may increased sugars.  Follow met b and a1c.

## 2018-09-09 NOTE — Assessment & Plan Note (Signed)
Blood pressure under good control.  Continue same medication regimen.  Follow pressures.  Follow metabolic panel.   

## 2018-09-09 NOTE — Assessment & Plan Note (Signed)
On simvastatin.  Low cholesterol diet and exercise.  Follow lipid panel and liver function tests.   

## 2018-09-11 ENCOUNTER — Ambulatory Visit (INDEPENDENT_AMBULATORY_CARE_PROVIDER_SITE_OTHER): Payer: Medicare Other

## 2018-09-11 DIAGNOSIS — E538 Deficiency of other specified B group vitamins: Secondary | ICD-10-CM

## 2018-09-11 MED ORDER — CYANOCOBALAMIN 1000 MCG/ML IJ SOLN
1000.0000 ug | Freq: Once | INTRAMUSCULAR | Status: AC
  Administered 2018-09-11: 1000 ug via INTRAMUSCULAR

## 2018-09-11 NOTE — Progress Notes (Addendum)
Patient comes in for B 12 injection.  Injected right deltoid.  Patient tolerated injection well.   Reviewed.  Dr Scott 

## 2018-10-01 ENCOUNTER — Other Ambulatory Visit: Payer: Self-pay | Admitting: Internal Medicine

## 2018-10-05 DIAGNOSIS — J302 Other seasonal allergic rhinitis: Secondary | ICD-10-CM | POA: Diagnosis not present

## 2018-10-05 DIAGNOSIS — J209 Acute bronchitis, unspecified: Secondary | ICD-10-CM | POA: Diagnosis not present

## 2018-10-05 DIAGNOSIS — J441 Chronic obstructive pulmonary disease with (acute) exacerbation: Secondary | ICD-10-CM | POA: Diagnosis not present

## 2018-10-05 DIAGNOSIS — J019 Acute sinusitis, unspecified: Secondary | ICD-10-CM | POA: Diagnosis not present

## 2018-10-05 DIAGNOSIS — B9689 Other specified bacterial agents as the cause of diseases classified elsewhere: Secondary | ICD-10-CM | POA: Diagnosis not present

## 2018-10-07 ENCOUNTER — Inpatient Hospital Stay
Admission: EM | Admit: 2018-10-07 | Discharge: 2018-10-09 | DRG: 247 | Disposition: A | Discharge status: Home / Self Care | Payer: Medicare Other | Attending: Internal Medicine | Admitting: Internal Medicine

## 2018-10-07 ENCOUNTER — Emergency Department: Payer: Medicare Other

## 2018-10-07 ENCOUNTER — Other Ambulatory Visit: Payer: Self-pay

## 2018-10-07 DIAGNOSIS — R079 Chest pain, unspecified: Secondary | ICD-10-CM | POA: Diagnosis not present

## 2018-10-07 DIAGNOSIS — Z7984 Long term (current) use of oral hypoglycemic drugs: Secondary | ICD-10-CM

## 2018-10-07 DIAGNOSIS — J449 Chronic obstructive pulmonary disease, unspecified: Secondary | ICD-10-CM | POA: Diagnosis not present

## 2018-10-07 DIAGNOSIS — Z7951 Long term (current) use of inhaled steroids: Secondary | ICD-10-CM

## 2018-10-07 DIAGNOSIS — Z66 Do not resuscitate: Secondary | ICD-10-CM | POA: Diagnosis present

## 2018-10-07 DIAGNOSIS — Z9889 Other specified postprocedural states: Secondary | ICD-10-CM | POA: Diagnosis not present

## 2018-10-07 DIAGNOSIS — M353 Polymyalgia rheumatica: Secondary | ICD-10-CM | POA: Diagnosis not present

## 2018-10-07 DIAGNOSIS — I2510 Atherosclerotic heart disease of native coronary artery without angina pectoris: Secondary | ICD-10-CM | POA: Diagnosis not present

## 2018-10-07 DIAGNOSIS — Z743 Need for continuous supervision: Secondary | ICD-10-CM | POA: Diagnosis not present

## 2018-10-07 DIAGNOSIS — Z79899 Other long term (current) drug therapy: Secondary | ICD-10-CM

## 2018-10-07 DIAGNOSIS — I10 Essential (primary) hypertension: Secondary | ICD-10-CM | POA: Diagnosis not present

## 2018-10-07 DIAGNOSIS — R7989 Other specified abnormal findings of blood chemistry: Secondary | ICD-10-CM | POA: Diagnosis not present

## 2018-10-07 DIAGNOSIS — I351 Nonrheumatic aortic (valve) insufficiency: Secondary | ICD-10-CM | POA: Diagnosis not present

## 2018-10-07 DIAGNOSIS — Z888 Allergy status to other drugs, medicaments and biological substances status: Secondary | ICD-10-CM | POA: Diagnosis not present

## 2018-10-07 DIAGNOSIS — R0789 Other chest pain: Secondary | ICD-10-CM | POA: Diagnosis not present

## 2018-10-07 DIAGNOSIS — Z7982 Long term (current) use of aspirin: Secondary | ICD-10-CM | POA: Diagnosis not present

## 2018-10-07 DIAGNOSIS — J019 Acute sinusitis, unspecified: Secondary | ICD-10-CM | POA: Diagnosis present

## 2018-10-07 DIAGNOSIS — E119 Type 2 diabetes mellitus without complications: Secondary | ICD-10-CM | POA: Diagnosis not present

## 2018-10-07 DIAGNOSIS — I214 Non-ST elevation (NSTEMI) myocardial infarction: Secondary | ICD-10-CM | POA: Diagnosis not present

## 2018-10-07 DIAGNOSIS — E7800 Pure hypercholesterolemia, unspecified: Secondary | ICD-10-CM | POA: Diagnosis not present

## 2018-10-07 DIAGNOSIS — R778 Other specified abnormalities of plasma proteins: Secondary | ICD-10-CM

## 2018-10-07 DIAGNOSIS — R000 Tachycardia, unspecified: Secondary | ICD-10-CM | POA: Diagnosis not present

## 2018-10-07 DIAGNOSIS — J45909 Unspecified asthma, uncomplicated: Secondary | ICD-10-CM | POA: Diagnosis not present

## 2018-10-07 DIAGNOSIS — I491 Atrial premature depolarization: Secondary | ICD-10-CM | POA: Diagnosis not present

## 2018-10-07 HISTORY — DX: Polymyalgia rheumatica: M35.3

## 2018-10-07 LAB — COMPREHENSIVE METABOLIC PANEL
ALT: 12 U/L (ref 0–44)
ANION GAP: 15 (ref 5–15)
AST: 31 U/L (ref 15–41)
Albumin: 3.9 g/dL (ref 3.5–5.0)
Alkaline Phosphatase: 49 U/L (ref 38–126)
BUN: 19 mg/dL (ref 8–23)
CO2: 19 mmol/L — ABNORMAL LOW (ref 22–32)
Calcium: 9.4 mg/dL (ref 8.9–10.3)
Chloride: 105 mmol/L (ref 98–111)
Creatinine, Ser: 0.86 mg/dL (ref 0.44–1.00)
GFR calc Af Amer: >60 mL/min (ref >60)
GFR calc non Af Amer: >60 mL/min (ref >60)
Glucose, Bld: 278 mg/dL — ABNORMAL HIGH (ref 70–99)
POTASSIUM: 4.2 mmol/L (ref 3.5–5.1)
Sodium: 139 mmol/L (ref 135–145)
Total Bilirubin: 0.6 mg/dL (ref 0.3–1.2)
Total Protein: 7 g/dL (ref 6.5–8.1)

## 2018-10-07 LAB — TROPONIN I
Troponin I: 0.07 ng/mL (ref <0.03)
Troponin I: 0.54 ng/mL (ref <0.03)

## 2018-10-07 LAB — CBC WITH DIFFERENTIAL/PLATELET
Abs Immature Granulocytes: 0.09 10*3/uL — ABNORMAL HIGH (ref 0.00–0.07)
Basophils Absolute: 0 10*3/uL (ref 0.0–0.1)
Basophils Relative: 0 %
Eosinophils Absolute: 0 10*3/uL (ref 0.0–0.5)
Eosinophils Relative: 0 %
HCT: 40.7 % (ref 36.0–46.0)
Hemoglobin: 13 g/dL (ref 12.0–15.0)
IMMATURE GRANULOCYTES: 1 %
Lymphocytes Relative: 4 %
Lymphs Abs: 0.6 10*3/uL — ABNORMAL LOW (ref 0.7–4.0)
MCH: 27.8 pg (ref 26.0–34.0)
MCHC: 31.9 g/dL (ref 30.0–36.0)
MCV: 87.2 fL (ref 80.0–100.0)
MONOS PCT: 3 %
Monocytes Absolute: 0.3 10*3/uL (ref 0.1–1.0)
Neutro Abs: 12.6 10*3/uL — ABNORMAL HIGH (ref 1.7–7.7)
Neutrophils Relative %: 92 %
Platelets: 353 10*3/uL (ref 150–400)
RBC: 4.67 MIL/uL (ref 3.87–5.11)
RDW: 15.1 % (ref 11.5–15.5)
WBC: 13.6 10*3/uL — ABNORMAL HIGH (ref 4.0–10.5)
nRBC: 0 % (ref 0.0–0.2)

## 2018-10-07 LAB — INFLUENZA PANEL BY PCR (TYPE A & B)
Influenza A By PCR: NEGATIVE
Influenza B By PCR: NEGATIVE

## 2018-10-07 LAB — GLUCOSE, CAPILLARY: Glucose-Capillary: 197 mg/dL — ABNORMAL HIGH (ref 70–99)

## 2018-10-07 MED ORDER — MUPIROCIN 2 % EX OINT
1.0000 "application " | TOPICAL_OINTMENT | Freq: Two times a day (BID) | CUTANEOUS | Status: DC
  Administered 2018-10-08 – 2018-10-09 (×2): 1 via TOPICAL
  Filled 2018-10-07: qty 22

## 2018-10-07 MED ORDER — LISINOPRIL 20 MG PO TABS
20.0000 mg | ORAL_TABLET | Freq: Every day | ORAL | Status: DC
  Administered 2018-10-08 – 2018-10-09 (×2): 20 mg via ORAL
  Filled 2018-10-07 (×2): qty 1

## 2018-10-07 MED ORDER — INSULIN ASPART 100 UNIT/ML ~~LOC~~ SOLN
0.0000 [IU] | Freq: Every day | SUBCUTANEOUS | Status: DC
  Administered 2018-10-08: 2 [IU] via SUBCUTANEOUS
  Filled 2018-10-07: qty 1

## 2018-10-07 MED ORDER — BENZONATATE 100 MG PO CAPS
200.0000 mg | ORAL_CAPSULE | Freq: Three times a day (TID) | ORAL | Status: DC
  Administered 2018-10-07 – 2018-10-09 (×5): 200 mg via ORAL
  Filled 2018-10-07 (×5): qty 2

## 2018-10-07 MED ORDER — SODIUM CHLORIDE 0.9% FLUSH: 3.0000 mL | INTRAVENOUS | Status: DC | PRN

## 2018-10-07 MED ORDER — SODIUM CHLORIDE 0.9 % IV SOLN: 250.0000 mL | INTRAVENOUS | Status: DC | PRN

## 2018-10-07 MED ORDER — SIMVASTATIN 20 MG PO TABS
10.0000 mg | ORAL_TABLET | Freq: Every day | ORAL | Status: DC
  Administered 2018-10-07: 10 mg via ORAL
  Filled 2018-10-07: qty 1

## 2018-10-07 MED ORDER — TRAZODONE HCL 50 MG PO TABS: 50.0000 mg | ORAL_TABLET | Freq: Every evening | ORAL | Status: DC | PRN

## 2018-10-07 MED ORDER — SALINE SPRAY 0.65 % NA SOLN
1.0000 | NASAL | Status: DC | PRN
  Filled 2018-10-07: qty 44

## 2018-10-07 MED ORDER — INSULIN ASPART 100 UNIT/ML ~~LOC~~ SOLN
0.0000 [IU] | Freq: Three times a day (TID) | SUBCUTANEOUS | Status: DC
  Administered 2018-10-08 – 2018-10-09 (×3): 3 [IU] via SUBCUTANEOUS
  Administered 2018-10-09: 2 [IU] via SUBCUTANEOUS
  Filled 2018-10-07 (×5): qty 1

## 2018-10-07 MED ORDER — ENOXAPARIN SODIUM 40 MG/0.4ML ~~LOC~~ SOLN
40.0000 mg | SUBCUTANEOUS | Status: DC
  Administered 2018-10-07 – 2018-10-08 (×2): 40 mg via SUBCUTANEOUS
  Filled 2018-10-07 (×2): qty 0.4

## 2018-10-07 MED ORDER — ONDANSETRON HCL 4 MG PO TABS: 4.0000 mg | ORAL_TABLET | Freq: Four times a day (QID) | ORAL | Status: DC | PRN

## 2018-10-07 MED ORDER — ACETAMINOPHEN 325 MG PO TABS: 650.0000 mg | ORAL_TABLET | Freq: Four times a day (QID) | ORAL | Status: DC | PRN

## 2018-10-07 MED ORDER — PREDNISONE 20 MG PO TABS
20.0000 mg | ORAL_TABLET | Freq: Two times a day (BID) | ORAL | Status: DC
  Administered 2018-10-07 – 2018-10-09 (×4): 20 mg via ORAL
  Filled 2018-10-07 (×4): qty 1

## 2018-10-07 MED ORDER — VITAMIN D (ERGOCALCIFEROL) 1.25 MG (50000 UNIT) PO CAPS
50000.0000 [IU] | ORAL_CAPSULE | ORAL | Status: DC
  Filled 2018-10-07: qty 1

## 2018-10-07 MED ORDER — ALENDRONATE SODIUM 70 MG PO TABS: 70.0000 mg | ORAL_TABLET | ORAL | Status: DC

## 2018-10-07 MED ORDER — CALCIUM CARBONATE ANTACID 500 MG PO CHEW
500.0000 mg | CHEWABLE_TABLET | Freq: Every day | ORAL | Status: DC
  Administered 2018-10-08 – 2018-10-09 (×2): 500 mg via ORAL
  Filled 2018-10-07 (×2): qty 3

## 2018-10-07 MED ORDER — AZITHROMYCIN 250 MG PO TABS
500.0000 mg | ORAL_TABLET | Freq: Every day | ORAL | Status: DC
  Administered 2018-10-08 – 2018-10-09 (×2): 500 mg via ORAL
  Filled 2018-10-07 (×2): qty 2

## 2018-10-07 MED ORDER — ADULT MULTIVITAMIN W/MINERALS CH
1.0000 | ORAL_TABLET | Freq: Every day | ORAL | Status: DC
  Administered 2018-10-08 – 2018-10-09 (×2): 1 via ORAL
  Filled 2018-10-07 (×2): qty 1

## 2018-10-07 MED ORDER — ALBUTEROL SULFATE (2.5 MG/3ML) 0.083% IN NEBU: 3.0000 mL | INHALATION_SOLUTION | Freq: Four times a day (QID) | RESPIRATORY_TRACT | Status: DC | PRN

## 2018-10-07 MED ORDER — HYDRALAZINE HCL 20 MG/ML IJ SOLN: 10.0000 mg | INTRAMUSCULAR | Status: DC | PRN

## 2018-10-07 MED ORDER — NITROGLYCERIN 0.4 MG SL SUBL: 0.4000 mg | SUBLINGUAL_TABLET | Freq: Once | SUBLINGUAL | Status: DC

## 2018-10-07 MED ORDER — ASPIRIN EC 81 MG PO TBEC
81.0000 mg | DELAYED_RELEASE_TABLET | Freq: Every day | ORAL | Status: DC
  Administered 2018-10-08 – 2018-10-09 (×2): 81 mg via ORAL
  Filled 2018-10-07 (×2): qty 1

## 2018-10-07 MED ORDER — DOXYCYCLINE HYCLATE 100 MG PO TABS
100.0000 mg | ORAL_TABLET | Freq: Two times a day (BID) | ORAL | Status: DC
  Administered 2018-10-07 – 2018-10-09 (×4): 100 mg via ORAL
  Filled 2018-10-07 (×4): qty 1

## 2018-10-07 MED ORDER — FLUTICASONE PROPIONATE 50 MCG/ACT NA SUSP
2.0000 | Freq: Every day | NASAL | Status: DC
  Administered 2018-10-08 – 2018-10-09 (×2): 2 via NASAL
  Filled 2018-10-07: qty 16

## 2018-10-07 MED ORDER — MOMETASONE FURO-FORMOTEROL FUM 200-5 MCG/ACT IN AERO
2.0000 | INHALATION_SPRAY | Freq: Two times a day (BID) | RESPIRATORY_TRACT | Status: DC
  Administered 2018-10-07 – 2018-10-09 (×4): 2 via RESPIRATORY_TRACT
  Filled 2018-10-07: qty 8.8

## 2018-10-07 MED ORDER — ALBUTEROL SULFATE (2.5 MG/3ML) 0.083% IN NEBU
2.5000 mg | INHALATION_SOLUTION | Freq: Four times a day (QID) | RESPIRATORY_TRACT | Status: DC
  Administered 2018-10-07 – 2018-10-08 (×2): 2.5 mg via RESPIRATORY_TRACT
  Filled 2018-10-07 (×2): qty 3

## 2018-10-07 MED ORDER — SODIUM CHLORIDE 0.9% FLUSH
3.0000 mL | Freq: Two times a day (BID) | INTRAVENOUS | Status: DC
  Administered 2018-10-07 – 2018-10-09 (×3): 3 mL via INTRAVENOUS

## 2018-10-07 MED ORDER — OCUVITE-LUTEIN PO CAPS
1.0000 | ORAL_CAPSULE | Freq: Every day | ORAL | Status: DC
  Administered 2018-10-08 – 2018-10-09 (×2): 1 via ORAL
  Filled 2018-10-07 (×2): qty 1

## 2018-10-07 MED ORDER — ONDANSETRON HCL 4 MG/2ML IJ SOLN: 4.0000 mg | Freq: Four times a day (QID) | INTRAMUSCULAR | Status: DC | PRN

## 2018-10-07 MED ORDER — IPRATROPIUM-ALBUTEROL 0.5-2.5 (3) MG/3ML IN SOLN: 3.0000 mL | Freq: Four times a day (QID) | RESPIRATORY_TRACT | Status: DC | PRN

## 2018-10-07 MED ORDER — ACETAMINOPHEN 650 MG RE SUPP: 650.0000 mg | Freq: Four times a day (QID) | RECTAL | Status: DC | PRN

## 2018-10-07 MED ORDER — POLYETHYLENE GLYCOL 3350 17 G PO PACK: 17.0000 g | PACK | Freq: Every day | ORAL | Status: DC | PRN

## 2018-10-07 NOTE — Progress Notes (Signed)
Family Meeting Note  Advance Directive:yes  Today a meeting took place with the Patient.  Patient is able to participate   The following clinical team members were present during this meeting:MD  The following were discussed:Patient's diagnosis:cp , Patient's progosis: Unable to determine and Goals for treatment: DNR  Additional follow-up to be provided: prn  Time spent during discussion:20 minutes  Bertrum Sol, MD

## 2018-10-07 NOTE — Progress Notes (Addendum)
CRITICAL VALUE STICKER  CRITICAL VALUE: troponin  RECEIVER (on-site recipient of call): [place], RN  DATE & TIME NOTIFIED: 10/07/18; 2126  MESSENGER (representative from lab):  MD NOTIFIED: On-call  TIME OF NOTIFICATION: 2127  RESPONSE: Continue to monitor

## 2018-10-07 NOTE — H&P (Signed)
[facility]   PATIENT NAME: Tanya Cole    MR#:  [id number]  DATE OF BIRTH:  09-29-1933  DATE OF ADMISSION:  10/07/2018  PRIMARY CARE PHYSICIAN: Dale Olton, MD   REQUESTING/REFERRING PHYSICIAN:   CHIEF COMPLAINT:   Chief Complaint  Patient presents with  . Chest Pain    HISTORY OF PRESENT ILLNESS: Tanya Cole  is a 84 y.o. female with a known history per below presented to the emergency room with sharp/stabbing mid chest pain which is nonradiating, associated with shakiness, ill feeling, treated with aspirin, chest pain made better with nitroglycerin, patient currently under treatment for sinusitis/bronchitis diagnosis to 3 days ago by primary care provider, ER work-up noted for troponin I 0.07, glucose 278, white count 13,000, noted tachycardia, tachypnea in the emergency room which was mild, patient evaluated in the emergency room, daughter at the bedside, no apparent distress, resting comfortably in bed, patient currently denies chest pain, patient is now being admitted for acute chest pain alleviated by nitroglycerin.  PAST MEDICAL HISTORY:   Past Medical History:  Diagnosis Date  . Asthma   . Diabetes mellitus ([facility])   . Hypercholesterolemia   . Hypertension   . Osteopenia   . Reactive airway disease     PAST SURGICAL HISTORY:  Past Surgical History:  Procedure Laterality Date  . ABDOMINAL HYSTERECTOMY  1981   prolapse and bleeding, ovaries not removed  . BREAST EXCISIONAL BIOPSY Right   . UMBILICAL HERNIA REPAIR  7/94    SOCIAL HISTORY:  Social History   Tobacco Use  . Smoking status: Never Smoker  . Smokeless tobacco: Never Used  Substance Use Topics  . Alcohol use: No    Alcohol/week: 0.0 standard drinks    FAMILY HISTORY:  Family History  Problem Relation Age of Onset  . Heart attack Father   . Arthritis Mother   . Heart disease Mother   . Throat cancer Sister     DRUG ALLERGIES:  Allergies   Allergen Reactions  . No Known Drug Allergy   . Tramadol Itching    Nausea    REVIEW OF SYSTEMS:   CONSTITUTIONAL: No fever, +fatigue, weakness.  EYES: No blurred or double vision.  EARS, NOSE, AND THROAT: No tinnitus or ear pain.  RESPIRATORY: No cough, shortness of breath, wheezing or hemoptysis.  CARDIOVASCULAR: +chest pain, no orthopnea, edema.  GASTROINTESTINAL: No nausea, vomiting, diarrhea or abdominal pain.  GENITOURINARY: No dysuria, hematuria.  ENDOCRINE: No polyuria, nocturia,  HEMATOLOGY: No anemia, easy bruising or bleeding SKIN: No rash or lesion. MUSCULOSKELETAL: No joint pain or arthritis.   NEUROLOGIC: No tingling, numbness, weakness.  PSYCHIATRY: No anxiety or depression.   MEDICATIONS AT HOME:  Prior to Admission medications   Medication Sig Start Date End Date Taking? Authorizing Provider  ADVAIR DISKUS 250-50 MCG/DOSE AEPB INHALE ONE PUFF BY MOUTH EVERY 12 HOURS. RINSE MOUTH AFTER EACH USE 02/17/16  Yes Dale [place], MD  alendronate (FOSAMAX) 70 MG tablet 70 mg once a week.  11/06/17  Yes [provider]  aspirin 81 MG tablet Take 81 mg by mouth daily.   Yes [provider]  benzonatate (TESSALON) 200 MG capsule Take 200 mg by mouth 3 (three) times daily. 10/05/18 10/12/18 Yes [provider]  cyanocobalamin (,VITAMIN B-12,) 1000 MCG/ML injection Inject into the muscle.   Yes [provider]  doxycycline (VIBRAMYCIN) 100 MG capsule Take 100 mg by mouth 2 (two) times daily. 10/05/18 10/15/18 Yes [provider]  fluticasone (FLONASE) 50 MCG/ACT nasal spray USE 2 SPRAY(S) IN EACH NOSTRIL ONCE DAILY 08/27/18  Yes Dale [place], MD  lisinopril (PRINIVIL,ZESTRIL) 20 MG tablet Take 20 mg by mouth daily.   Yes [provider]  metFORMIN (GLUCOPHAGE) 500 MG tablet TAKE 1 TABLET BY MOUTH TWICE DAILY WITH A MEAL 10/01/18  Yes Dale [place], MD  Multiple Vitamins-Minerals (VISION FORMULA/LUTEIN PO) Take by mouth  daily.   Yes [provider]  mupirocin ointment (BACTROBAN) 2 % Apply 1 application topically 2 (two) times daily. 05/27/16  Yes Dale [place], MD  predniSONE (DELTASONE) 20 MG tablet Take 20 mg by mouth 2 (two) times daily. 10/05/18 10/10/18 Yes [provider]  PROAIR HFA 108 (90 Base) MCG/ACT inhaler Inhale 2 puffs into the lungs 4 (four) times daily as needed. 10/05/18  Yes [provider]  simvastatin (ZOCOR) 10 MG tablet TAKE 1 TABLET BY MOUTH AT BEDTIME 05/10/18  Yes Dale All[place], MD  Vitamin D, Ergocalciferol, (DRISDOL) 50000 units CAPS capsule Take 50,000 Units by mouth once a week. 06/24/18  Yes [provider]  calcium carbonate (OS-CAL) 600 MG TABS Take 600 mg by mouth daily.    [provider]      PHYSICAL EXAMINATION:   VITAL SIGNS: Blood pressure 133/61, pulse 85, temperature 98.2 F (36.8 C), temperature source Oral, resp. rate (!) 24, height 5\' 1"  (1.549 m), weight 54 kg, SpO2 93 %.  GENERAL:  84 y.o.-year-old patient lying in the bed with no acute distress.  Frail-appearing EYES: Pupils equal, round, reactive to light and accommodation. No scleral icterus. Extraocular muscles intact.  HEENT: Head atraumatic, normocephalic. Oropharynx and nasopharynx clear.  NECK:  Supple, no jugular venous distention. No thyroid enlargement, no tenderness.  LUNGS: Normal breath sounds bilaterally, no wheezing, rales,rhonchi or crepitation. No use of accessory muscles of respiration.  CARDIOVASCULAR: S1, S2 normal. No murmurs, rubs, or gallops.  ABDOMEN: Soft, nontender, nondistended. Bowel sounds present. No organomegaly or mass.  EXTREMITIES: No pedal edema, cyanosis, or clubbing.  NEUROLOGIC: Cranial nerves II through XII are intact. Muscle strength 5/5 in all extremities. Sensation intact. Gait not checked.  PSYCHIATRIC: The patient is alert and oriented x 3.  SKIN: No obvious rash, lesion, or ulcer.   LABORATORY PANEL:   CBC Recent  Labs  Lab 10/07/18 1411  WBC 13.6*  HGB 13.0  HCT 40.7  PLT 353  MCV 87.2  MCH 27.8  MCHC 31.9  RDW 15.1  LYMPHSABS 0.6*  MONOABS 0.3  EOSABS 0.0  BASOSABS 0.0   ------------------------------------------------------------------------------------------------------------------  Chemistries  Recent Labs  Lab 10/07/18 1443  NA 139  K 4.2  CL 105  CO2 19*  GLUCOSE 278*  BUN 19  CREATININE 0.86  CALCIUM 9.4  AST 31  ALT 12  ALKPHOS 49  BILITOT 0.6   ------------------------------------------------------------------------------------------------------------------ estimated creatinine clearance is 37.4 mL/min (by C-G formula based on SCr of 0.86 mg/dL). ------------------------------------------------------------------------------------------------------------------ No results for input(s): TSH, T4TOTAL, T3FREE, THYROIDAB in the last 72 hours.  Invalid input(s): FREET3   Coagulation profile No results for input(s): INR, PROTIME in the last 168 hours. ------------------------------------------------------------------------------------------------------------------- No results for input(s): DDIMER in the last 72 hours. -------------------------------------------------------------------------------------------------------------------  Cardiac Enzymes Recent Labs  Lab 10/07/18 1443  TROPONINI 0.07*   ------------------------------------------------------------------------------------------------------------------ Invalid input(s): POCBNP  ---------------------------------------------------------------------------------------------------------------  Urinalysis    Component Value Date/Time   COLORURINE YELLOW (A) 04/24/2016 0950   APPEARANCEUR CLEAR (A) 04/24/2016 0950   APPEARANCEUR Clear 02/18/2013 2004   LABSPEC 1.011 04/24/2016 0950  LABSPEC 1.024 02/18/2013 2004   PHURINE 5.0 04/24/2016 0950   GLUCOSEU NEGATIVE 04/24/2016 0950   GLUCOSEU NEGATIVE  01/28/2015 0810   HGBUR NEGATIVE 04/24/2016 0950   BILIRUBINUR NEGATIVE 04/24/2016 0950   BILIRUBINUR neg 01/16/2015 1437   BILIRUBINUR Negative 02/18/2013 2004   KETONESUR NEGATIVE 04/24/2016 0950   PROTEINUR NEGATIVE 04/24/2016 0950   UROBILINOGEN 0.2 01/28/2015 0810   NITRITE NEGATIVE 04/24/2016 0950   LEUKOCYTESUR TRACE (A) 04/24/2016 0950   LEUKOCYTESUR Trace 02/18/2013 2004     RADIOLOGY: Dg Chest Portable 1 View  Result Date: 10/07/2018 CLINICAL DATA:  Central chest pain beginning today. EXAM: PORTABLE CHEST 1 VIEW COMPARISON:  CT 02/11/2010 FINDINGS: Heart size is normal. Chronic aortic atherosclerosis. Chronic calcified granuloma at the left base laterally. The remainder the chest is clear. IMPRESSION: No active disease. Electronically Signed   By: Paulina Fusi M.D.   On: 10/07/2018 14:51    EKG: Orders placed or performed during the hospital encounter of 10/07/18  . ED EKG within 10 minutes  . ED EKG within 10 minutes    IMPRESSION AND PLAN: *Acute chest pain Alleviated by nitroglycerin Referred to the observation unit on our chest pain protocol, rule out acute coronary syndrome with cardiac enzymes x3 sets, consult cardiology for expert opinion given elevated troponins, check echocardiogram, aspirin, lisinopril, Lopressor, nitrates as needed, morphine PRN breakthrough pain, supplemental oxygen PRN, and continue close medical monitoring  *Acute elevated troponins Plan of care as stated above  *Acute sinusitis/bronchitis Empiric azithromycin for 5-day course, nasal saline PRN, Flonase, Tessalon Perles  *Chronic asthma/COPD without exacerbation Breathing treatments as needed  *Chronic benign essential hypertension Stable Continue current regiment  *Chronic hyperlipidemia, unspecified Check lipids in the morning, continue statin therapy   All the records are reviewed and case discussed with ED provider. Management plans discussed with the patient, family and  they are in agreement.  CODE STATUS:dnr Advance Directive Documentation     Most Recent Value  Type of Advance Directive  Living will  Pre-existing out of facility DNR order (yellow form or pink MOST form)  -  "MOST" Form in Place?  -       TOTAL TIME TAKING CARE OF THIS PATIENT: 40 minutes.    Evelena Asa Salary M.D on 10/07/2018   Between 7am to 6pm - Pager - [phone]  After 6pm go to [email or web address] - password [email or web address]  [facility]  Office  [phone]  CC: Primary care physician; Dale [place], MD   Note: This dictation was prepared with Dragon dictation along with smaller phrase technology. Any transcriptional errors that result from this process are unintentional.

## 2018-10-07 NOTE — ED Triage Notes (Addendum)
Pt arrived via ems for report of chest pain after eating breakfast this morning - the pain is described as sharp and stabbing and was 9/10 in mid chest region - ems gave 4 asa and 1 spray of nitro which completely relieved the chest apin - denies shortness of breath - denies N/V

## 2018-10-07 NOTE — ED Provider Notes (Signed)
[facility] Emergency Department Provider Note ____________________________________________   First MD Initiated Contact with Patient 10/07/18 1433     (approximate)  I have reviewed the triage vital signs and the nursing notes.   HISTORY  Chief Complaint Chest Pain    HPI [place] Raycraft is a 84 y.o. female with PMH as noted below who presents with chest pain, acute onset this morning, described as sharp and as a "tinge" and nonradiating.  She states that she has been feeling shaky all day as well.  She was given aspirin and a nitro spray by EMS and the pain resolved.  The patient states that she was just at her doctor's office 2 days ago and was diagnosed with sinus infection and likely bronchitis.  She has been taking inhalers for this.  She denies fever or chills, vomiting, diarrhea, or any shortness of breath.   Past Medical History:  Diagnosis Date  . Asthma   . Diabetes mellitus (HCC)   . Hypercholesterolemia   . Hypertension   . Osteopenia   . Reactive airway disease     Patient Active Problem List   Diagnosis Date Noted  . Memory change 05/27/2018  . Osteoporosis 02/05/2018  . Weight loss 06/24/2017  . Abdominal pain, left lower quadrant 10/05/2016  . Long term current use of systemic steroids 05/16/2016  . Elevated erythrocyte sedimentation rate 05/04/2016  . Back pain 04/29/2016  . Fatigue 05/24/2015  . Health care maintenance 01/25/2015  . UTI (urinary tract infection) 07/07/2014  . Neuropathy 03/24/2014  . Diverticulitis 02/24/2013  . Reactive airway disease 09/15/2012  . Hypertension 09/14/2012  . Hypercholesterolemia 09/14/2012  . Diabetes mellitus (HCC) 09/14/2012    Past Surgical History:  Procedure Laterality Date  . ABDOMINAL HYSTERECTOMY  1981   prolapse and bleeding, ovaries not removed  . BREAST EXCISIONAL BIOPSY Right   . UMBILICAL HERNIA REPAIR  7/94    Prior to Admission medications   Medication Sig Start  Date End Date Taking? Authorizing Provider  ADVAIR DISKUS 250-50 MCG/DOSE AEPB INHALE ONE PUFF BY MOUTH EVERY 12 HOURS. RINSE MOUTH AFTER EACH USE 02/17/16  Yes Dale [place], MD  alendronate (FOSAMAX) 70 MG tablet 70 mg once a week.  11/06/17  Yes [provider]  aspirin 81 MG tablet Take 81 mg by mouth daily.   Yes [provider]  benzonatate (TESSALON) 200 MG capsule Take 200 mg by mouth 3 (three) times daily. 10/05/18 10/12/18 Yes [provider]  cyanocobalamin (,VITAMIN B-12,) 1000 MCG/ML injection Inject into the muscle.   Yes [provider]  doxycycline (VIBRAMYCIN) 100 MG capsule Take 100 mg by mouth 2 (two) times daily. 10/05/18 10/15/18 Yes [provider]  fluticasone (FLONASE) 50 MCG/ACT nasal spray USE 2 SPRAY(S) IN EACH NOSTRIL ONCE DAILY 08/27/18  Yes Dale [place], MD  lisinopril (PRINIVIL,ZESTRIL) 20 MG tablet Take 20 mg by mouth daily.   Yes [provider]  metFORMIN (GLUCOPHAGE) 500 MG tablet TAKE 1 TABLET BY MOUTH TWICE DAILY WITH A MEAL 10/01/18  Yes Dale [place], MD  Multiple Vitamins-Minerals (VISION FORMULA/LUTEIN PO) Take by mouth daily.   Yes [provider]  mupirocin ointment (BACTROBAN) 2 % Apply 1 application topically 2 (two) times daily. 05/27/16  Yes Dale , MD  predniSONE (DELTASONE) 20 MG tablet Take 20 mg by mouth 2 (two) times daily. 10/05/18 10/10/18 Yes [provider]  PROAIR HFA 108 (90 Base) MCG/ACT inhaler Inhale 2 puffs into the lungs 4 (four)  times daily as needed. 10/05/18  Yes [provider]  simvastatin (ZOCOR) 10 MG tablet TAKE 1 TABLET BY MOUTH AT BEDTIME 05/10/18  Yes Dale [place], MD  Vitamin D, Ergocalciferol, (DRISDOL) 50000 units CAPS capsule Take 50,000 Units by mouth once a week. 06/24/18  Yes [provider]  calcium carbonate (OS-CAL) 600 MG TABS Take 600 mg by mouth daily.    [provider]  cefdinir (OMNICEF) 300 MG capsule  Take 1 capsule (300 mg total) by mouth 2 (two) times daily. Patient not taking: Reported on 10/07/2018 09/06/18   Dale [place], MD  predniSONE (DELTASONE) 10 MG tablet Take 4 tablets x 1 day and then decrease by 1/2 tablet per day until down to zero mg. Patient not taking: Reported on 10/07/2018 09/06/18   Dale , MD    Allergies No known drug allergy and Tramadol  Family History  Problem Relation Age of Onset  . Heart attack Father   . Arthritis Mother   . Heart disease Mother   . Throat cancer Sister     Social History Social History   Tobacco Use  . Smoking status: Never Smoker  . Smokeless tobacco: Never Used  Substance Use Topics  . Alcohol use: No    Alcohol/week: 0.0 standard drinks  . Drug use: No    Review of Systems  Constitutional: No fever. Eyes: No redness. ENT: No sore throat.  Positive for nasal congestion. Cardiovascular: Positive for resolved chest pain. Respiratory: Denies shortness of breath. Gastrointestinal: No vomiting or diarrhea. Genitourinary: Negative for dysuria.  Musculoskeletal: Negative for back pain. Skin: Negative for rash. Neurological: Negative for headache.   ____________________________________________   PHYSICAL EXAM:  VITAL SIGNS: ED Triage Vitals  Enc Vitals Group     BP 10/07/18 1402 137/74     Pulse Rate 10/07/18 1402 (!) 103     Resp 10/07/18 1402 (!) 24     Temp 10/07/18 1402 98.2 F (36.8 C)     Temp Source 10/07/18 1402 Oral     SpO2 10/07/18 1400 98 %     Weight 10/07/18 1403 119 lb (54 kg)     Height 10/07/18 1403 5\' 1"  (1.549 m)     Head Circumference --      Peak Flow --      Pain Score 10/07/18 1403 0     Pain Loc --      Pain Edu? --      Excl. in GC? --     Constitutional: Alert and oriented. Well appearing for her age and in no acute distress. Eyes: Conjunctivae are normal.  Head: Atraumatic. Nose: No congestion/rhinnorhea. Mouth/Throat: Mucous membranes are slightly dry. Neck: Normal  range of motion.  Cardiovascular: Normal rate, regular rhythm. Grossly normal heart sounds.  Good peripheral circulation. Respiratory: Normal respiratory effort.  No retractions. Lungs CTAB. Gastrointestinal: No distention.  Musculoskeletal: No lower extremity edema.  Extremities warm and well perfused.  Neurologic:  Normal speech and language. No gross focal neurologic deficits are appreciated.  Skin:  Skin is warm and dry. No rash noted. Psychiatric: Mood and affect are normal. Speech and behavior are normal.  ____________________________________________   LABS (all labs ordered are listed, but only abnormal results are displayed)  Labs Reviewed  CBC WITH DIFFERENTIAL/PLATELET - Abnormal; Notable for the following components:      Result Value   WBC 13.6 (*)    Neutro Abs 12.6 (*)    Lymphs Abs 0.6 (*)    Abs Immature  Granulocytes 0.09 (*)    All other components within normal limits  COMPREHENSIVE METABOLIC PANEL - Abnormal; Notable for the following components:   CO2 19 (*)    Glucose, Bld 278 (*)    All other components within normal limits  TROPONIN I - Abnormal; Notable for the following components:   Troponin I 0.07 (*)    All other components within normal limits  INFLUENZA PANEL BY PCR (TYPE A & B)   ____________________________________________  EKG  ED ECG REPORT I, Dionne Bucy, the attending physician, personally viewed and interpreted this ECG.  Date: 10/07/2018 EKG Time: 1402 Rate: 89 Rhythm: normal sinus rhythm with PVCs QRS Axis: normal Intervals: normal ST/T Wave abnormalities: Nonspecific ST abnormalities Narrative Interpretation: no evidence of acute ischemia  ____________________________________________  RADIOLOGY  CXR: No focal infiltrate or edema  ____________________________________________   PROCEDURES  Procedure(s) performed: No  Procedures  Critical Care performed:  No ____________________________________________   INITIAL IMPRESSION / ASSESSMENT AND PLAN / ED COURSE  Pertinent labs & imaging results that were available during my care of the patient were reviewed by me and considered in my medical decision making (see chart for details).  84 year old female with PMH as noted above (with no prior CAD history) presents with atypical chest pain and feeling shaky since this morning.  The patient was seen by her doctor 2 days ago and is being treated for likely sinus infection/bronchitis.  I reviewed the past medical records in epic and the note from 10/05/2018.  The patient was diagnosed with bacterial sinusitis as well as bronchitis, and given doxycycline, prednisone, and albuterol.  On exam the patient is relatively well appearing for her age.  Her vital signs are normal and her O2 saturation is in the mid to high 90s on room air.  The remainder of the exam is unremarkable.  Her EKG shows some PVCs and nonspecific ST flattening but no significant ischemic findings.  Overall presentation is most consistent with musculoskeletal chest pain given the patient's cough, versus some pleuritic pain related to the bronchitis.  Differential also includes ACS although this is less likely.  We will obtain chest x-ray, lab work-up, cardiac enzymes, and reassess.  ----------------------------------------- 5:37 PM on 10/07/2018 -----------------------------------------  Initial troponin is slightly elevated at 0.07.  The other lab work-up is reassuring.  The patient had another episode of similar chest pain while in the ED.  She received a nitroglycerin and it again resolved.  Given the patient's elevated troponin and no renal disease history or other reason that she should have an elevated troponin at baseline, as well as the recurrent pain and the fact that it is relieved by nitroglycerin this all concerns me for possible ACS.  I discussed this with the patient and her  family members.  They agree with admission for ACS rule out.  I signed the patient out to the hospitalist Dr. Katheren Shams. ____________________________________________   FINAL CLINICAL IMPRESSION(S) / ED DIAGNOSES  Final diagnoses:  Atypical chest pain  Elevated troponin      NEW MEDICATIONS STARTED DURING THIS VISIT:  New Prescriptions   No medications on file     Note:  This document was prepared using Dragon voice recognition software and may include unintentional dictation errors.    Dionne Bucy, MD 10/07/18 [phone]

## 2018-10-07 NOTE — Progress Notes (Signed)

## 2018-10-07 NOTE — ED Notes (Signed)
Report called to 2a 

## 2018-10-08 ENCOUNTER — Other Ambulatory Visit: Payer: Self-pay

## 2018-10-08 ENCOUNTER — Encounter: Payer: Self-pay | Admitting: Physician Assistant

## 2018-10-08 ENCOUNTER — Encounter
Admission: EM | Disposition: A | Discharge status: Home / Self Care | Payer: Self-pay | Source: Home / Self Care | Attending: Internal Medicine

## 2018-10-08 ENCOUNTER — Observation Stay (HOSPITAL_COMMUNITY)
Admit: 2018-10-08 | Discharge: 2018-10-08 | Disposition: A | Discharge status: Home / Self Care | Payer: Medicare Other | Attending: Family Medicine | Admitting: Family Medicine

## 2018-10-08 DIAGNOSIS — Z7951 Long term (current) use of inhaled steroids: Secondary | ICD-10-CM | POA: Diagnosis not present

## 2018-10-08 DIAGNOSIS — I214 Non-ST elevation (NSTEMI) myocardial infarction: Secondary | ICD-10-CM

## 2018-10-08 DIAGNOSIS — Z66 Do not resuscitate: Secondary | ICD-10-CM | POA: Diagnosis present

## 2018-10-08 DIAGNOSIS — Z79899 Other long term (current) drug therapy: Secondary | ICD-10-CM | POA: Diagnosis not present

## 2018-10-08 DIAGNOSIS — J449 Chronic obstructive pulmonary disease, unspecified: Secondary | ICD-10-CM | POA: Diagnosis present

## 2018-10-08 DIAGNOSIS — Z7984 Long term (current) use of oral hypoglycemic drugs: Secondary | ICD-10-CM | POA: Diagnosis not present

## 2018-10-08 DIAGNOSIS — I10 Essential (primary) hypertension: Secondary | ICD-10-CM | POA: Diagnosis present

## 2018-10-08 DIAGNOSIS — I2510 Atherosclerotic heart disease of native coronary artery without angina pectoris: Secondary | ICD-10-CM

## 2018-10-08 DIAGNOSIS — I351 Nonrheumatic aortic (valve) insufficiency: Secondary | ICD-10-CM | POA: Diagnosis not present

## 2018-10-08 DIAGNOSIS — E7800 Pure hypercholesterolemia, unspecified: Secondary | ICD-10-CM | POA: Diagnosis present

## 2018-10-08 DIAGNOSIS — J019 Acute sinusitis, unspecified: Secondary | ICD-10-CM | POA: Diagnosis present

## 2018-10-08 DIAGNOSIS — M353 Polymyalgia rheumatica: Secondary | ICD-10-CM | POA: Diagnosis present

## 2018-10-08 DIAGNOSIS — Z888 Allergy status to other drugs, medicaments and biological substances status: Secondary | ICD-10-CM | POA: Diagnosis not present

## 2018-10-08 DIAGNOSIS — E119 Type 2 diabetes mellitus without complications: Secondary | ICD-10-CM | POA: Diagnosis present

## 2018-10-08 DIAGNOSIS — Z7982 Long term (current) use of aspirin: Secondary | ICD-10-CM | POA: Diagnosis not present

## 2018-10-08 HISTORY — PX: LEFT HEART CATH AND CORONARY ANGIOGRAPHY: CATH118249

## 2018-10-08 HISTORY — PX: CORONARY STENT INTERVENTION: CATH118234

## 2018-10-08 LAB — MAGNESIUM: Magnesium: 1.8 mg/dL (ref 1.7–2.4)

## 2018-10-08 LAB — TSH: TSH: 1.141 u[IU]/mL (ref 0.350–4.500)

## 2018-10-08 LAB — LIPID PANEL
Cholesterol: 172 mg/dL (ref 0–200)
HDL: 49 mg/dL (ref >40)
LDL Cholesterol: 53 mg/dL (ref 0–99)
Total CHOL/HDL Ratio: 3.5 RATIO
Triglycerides: 349 mg/dL — ABNORMAL HIGH (ref <150)
VLDL: 70 mg/dL — ABNORMAL HIGH (ref 0–40)

## 2018-10-08 LAB — TROPONIN I
Troponin I: 0.5 ng/mL (ref <0.03)
Troponin I: 0.71 ng/mL (ref <0.03)

## 2018-10-08 LAB — GLUCOSE, CAPILLARY
Glucose-Capillary: 171 mg/dL — ABNORMAL HIGH (ref 70–99)
Glucose-Capillary: 138 mg/dL — ABNORMAL HIGH (ref 70–99)
Glucose-Capillary: 160 mg/dL — ABNORMAL HIGH (ref 70–99)
Glucose-Capillary: 186 mg/dL — ABNORMAL HIGH (ref 70–99)
Glucose-Capillary: 217 mg/dL — ABNORMAL HIGH (ref 70–99)

## 2018-10-08 LAB — HEMOGLOBIN A1C
HEMOGLOBIN A1C: 6.6 % — AB (ref 4.8–5.6)
Mean Plasma Glucose: 142.72 mg/dL

## 2018-10-08 LAB — POCT ACTIVATED CLOTTING TIME: Activated Clotting Time: 202 seconds

## 2018-10-08 LAB — ECHOCARDIOGRAM COMPLETE
Height: 61 in
Weight: 1904 oz

## 2018-10-08 SURGERY — LEFT HEART CATH AND CORONARY ANGIOGRAPHY
Anesthesia: Moderate Sedation

## 2018-10-08 MED ORDER — ALBUTEROL SULFATE (2.5 MG/3ML) 0.083% IN NEBU
2.5000 mg | INHALATION_SOLUTION | Freq: Two times a day (BID) | RESPIRATORY_TRACT | Status: DC
  Administered 2018-10-08 – 2018-10-09 (×2): 2.5 mg via RESPIRATORY_TRACT
  Filled 2018-10-08 (×3): qty 3

## 2018-10-08 MED ORDER — SODIUM CHLORIDE 0.9 % WEIGHT BASED INFUSION
1.0000 mL/kg/h | INTRAVENOUS | Status: AC
  Administered 2018-10-08: 1 mL/kg/h via INTRAVENOUS

## 2018-10-08 MED ORDER — VERAPAMIL HCL 2.5 MG/ML IV SOLN
INTRAVENOUS | Status: AC
  Filled 2018-10-08: qty 2

## 2018-10-08 MED ORDER — SODIUM CHLORIDE 0.9% FLUSH: 3.0000 mL | Freq: Two times a day (BID) | INTRAVENOUS | Status: DC

## 2018-10-08 MED ORDER — ALUM & MAG HYDROXIDE-SIMETH 200-200-20 MG/5ML PO SUSP
ORAL | Status: AC
  Filled 2018-10-08: qty 30

## 2018-10-08 MED ORDER — SODIUM CHLORIDE 0.9 % IV SOLN: 250.0000 mL | INTRAVENOUS | Status: DC | PRN

## 2018-10-08 MED ORDER — NITROGLYCERIN 5 MG/ML IV SOLN
INTRAVENOUS | Status: AC
  Filled 2018-10-08: qty 10

## 2018-10-08 MED ORDER — ATORVASTATIN CALCIUM 20 MG PO TABS
40.0000 mg | ORAL_TABLET | Freq: Every day | ORAL | Status: DC
  Administered 2018-10-08: 40 mg via ORAL
  Filled 2018-10-08: qty 1
  Filled 2018-10-08: qty 2

## 2018-10-08 MED ORDER — CLOPIDOGREL BISULFATE 75 MG PO TABS
ORAL_TABLET | ORAL | Status: DC | PRN
  Administered 2018-10-08: 600 mg via ORAL

## 2018-10-08 MED ORDER — HEPARIN (PORCINE) IN NACL 1000-0.9 UT/500ML-% IV SOLN
INTRAVENOUS | Status: AC
  Filled 2018-10-08: qty 1000

## 2018-10-08 MED ORDER — SODIUM CHLORIDE 0.9 % WEIGHT BASED INFUSION: 1.0000 mL/kg/h | INTRAVENOUS | Status: DC

## 2018-10-08 MED ORDER — CLOPIDOGREL BISULFATE 75 MG PO TABS
ORAL_TABLET | ORAL | Status: AC
  Filled 2018-10-08: qty 1

## 2018-10-08 MED ORDER — ASPIRIN 81 MG PO CHEW
CHEWABLE_TABLET | ORAL | Status: AC
  Filled 2018-10-08: qty 3

## 2018-10-08 MED ORDER — SODIUM CHLORIDE 0.9% FLUSH: 3.0000 mL | INTRAVENOUS | Status: DC | PRN

## 2018-10-08 MED ORDER — FENTANYL CITRATE (PF) 100 MCG/2ML IJ SOLN
INTRAMUSCULAR | Status: AC
  Filled 2018-10-08: qty 2

## 2018-10-08 MED ORDER — METOPROLOL TARTRATE 25 MG PO TABS
12.5000 mg | ORAL_TABLET | Freq: Two times a day (BID) | ORAL | Status: DC
  Administered 2018-10-08 – 2018-10-09 (×3): 12.5 mg via ORAL
  Filled 2018-10-08 (×3): qty 1

## 2018-10-08 MED ORDER — ASPIRIN 81 MG PO CHEW
CHEWABLE_TABLET | ORAL | Status: DC | PRN
  Administered 2018-10-08: 243 mg via ORAL

## 2018-10-08 MED ORDER — CLOPIDOGREL BISULFATE 75 MG PO TABS
ORAL_TABLET | ORAL | Status: AC
  Filled 2018-10-08: qty 8

## 2018-10-08 MED ORDER — HEPARIN SODIUM (PORCINE) 1000 UNIT/ML IJ SOLN
INTRAMUSCULAR | Status: DC | PRN
  Administered 2018-10-08: 2500 [IU] via INTRAVENOUS
  Administered 2018-10-08: 2000 [IU] via INTRAVENOUS
  Administered 2018-10-08: 3000 [IU] via INTRAVENOUS

## 2018-10-08 MED ORDER — HEPARIN SODIUM (PORCINE) 1000 UNIT/ML IJ SOLN
INTRAMUSCULAR | Status: AC
  Filled 2018-10-08: qty 1

## 2018-10-08 MED ORDER — MIDAZOLAM HCL 2 MG/2ML IJ SOLN
INTRAMUSCULAR | Status: AC
  Filled 2018-10-08: qty 2

## 2018-10-08 MED ORDER — VERAPAMIL HCL 2.5 MG/ML IV SOLN
INTRAVENOUS | Status: DC | PRN
  Administered 2018-10-08 (×2): 2.5 mg via INTRA_ARTERIAL
  Administered 2018-10-08: 5 mg via INTRA_ARTERIAL

## 2018-10-08 MED ORDER — CLOPIDOGREL BISULFATE 75 MG PO TABS
75.0000 mg | ORAL_TABLET | Freq: Every day | ORAL | Status: DC
  Administered 2018-10-09: 75 mg via ORAL
  Filled 2018-10-08: qty 1

## 2018-10-08 MED ORDER — ALUM & MAG HYDROXIDE-SIMETH 200-200-20 MG/5ML PO SUSP
15.0000 mL | Freq: Four times a day (QID) | ORAL | Status: DC | PRN
  Administered 2018-10-08: 15 mL via ORAL

## 2018-10-08 MED ORDER — SODIUM CHLORIDE 0.9 % WEIGHT BASED INFUSION
3.0000 mL/kg/h | INTRAVENOUS | Status: DC
  Administered 2018-10-08: 3 mL/kg/h via INTRAVENOUS

## 2018-10-08 MED ORDER — NITROGLYCERIN 1 MG/10 ML FOR IR/CATH LAB
INTRA_ARTERIAL | Status: DC | PRN
  Administered 2018-10-08: 100 ug via INTRA_ARTERIAL
  Administered 2018-10-08: 300 ug via INTRA_ARTERIAL

## 2018-10-08 MED ORDER — MIDAZOLAM HCL 2 MG/2ML IJ SOLN
INTRAMUSCULAR | Status: DC | PRN
  Administered 2018-10-08 (×2): 0.5 mg via INTRAVENOUS

## 2018-10-08 MED ORDER — FENTANYL CITRATE (PF) 100 MCG/2ML IJ SOLN
INTRAMUSCULAR | Status: DC | PRN
  Administered 2018-10-08 (×2): 25 ug via INTRAVENOUS

## 2018-10-08 MED ORDER — SODIUM CHLORIDE 0.9% FLUSH
3.0000 mL | Freq: Two times a day (BID) | INTRAVENOUS | Status: DC
  Administered 2018-10-08 – 2018-10-09 (×2): 3 mL via INTRAVENOUS

## 2018-10-08 MED ORDER — IOPAMIDOL (ISOVUE-300) INJECTION 61%
INTRAVENOUS | Status: DC | PRN
  Administered 2018-10-08: 115 mL via INTRA_ARTERIAL

## 2018-10-08 SURGICAL SUPPLY — 15 items
BALLN TREK RX 2.5X12 (BALLOONS) ×3
BALLN ~~LOC~~ TREK RX 2.75X8 (BALLOONS) ×3
BALLOON TREK RX 2.5X12 (BALLOONS) ×1 IMPLANT
BALLOON ~~LOC~~ TREK RX 2.75X8 (BALLOONS) ×1 IMPLANT
CATH INFINITI 5FR JK (CATHETERS) ×3 IMPLANT
CATH LAUNCHER 6FR EBU3.5 (CATHETERS) ×3 IMPLANT
DEVICE INFLAT 30 PLUS (MISCELLANEOUS) ×3 IMPLANT
DEVICE RAD TR BAND REGULAR (VASCULAR PRODUCTS) ×3 IMPLANT
GLIDESHEATH SLEND SS 6F .021 (SHEATH) ×3 IMPLANT
KIT MANI 3VAL PERCEP (MISCELLANEOUS) ×3 IMPLANT
PACK CARDIAC CATH (CUSTOM PROCEDURE TRAY) ×3 IMPLANT
STENT RESOLUTE ONYX 2.5X15 (Permanent Stent) ×3 IMPLANT
WIRE HITORQ VERSACORE ST 145CM (WIRE) ×3 IMPLANT
WIRE ROSEN-J .035X260CM (WIRE) ×3 IMPLANT
WIRE RUNTHROUGH .014X180CM (WIRE) ×3 IMPLANT

## 2018-10-08 NOTE — Consult Note (Signed)
Cardiology Consultation:   [name]; [id number]; 1933-02-22   Admit date: 10/07/2018 Date of Consult: 10/08/2018  Primary Care Provider: Dale Storden, MD Primary Cardiologist: New to [facility] - consult by Arida   Patient Profile:   Tanya Cole is a 84 y.o. female with a hx of DM2, PMR, asthma, HTN, HLD, and osteopenia who is being seen today for the evaluation of chest pain at the request of Dr. Katheren Shams.  History of Present Illness:   Tanya Cole has no previously known cardiac history. Patient has noted a significant increase in fatigue over the past couple of weeks. She was in her usual state of health on the morning of 12/8 when she suddenly developed substernal chest pain that was rated 8/10 and lasted for ~ 30 minutes (until EMS arrived as below). There was associated dyspnea and "shakiness." Never had pain like this before. She initially thought this was related to indigestion, though she typically does not get indigestion.   Patient was given SL NTG and 4 ASA 81 mg in the field by EMS with improvement in her symptoms.   Upon the patient's arrival to [facility] they were found to have BP ranging from 108-->1512 systolic, HR 72-->103 bpm, temp 98.2, oxygen saturation 95% on room air, weight 54 kg. EKG showed no acute changes as below, CXR showed no active cardiopulmonary disease. Labs showed troponin 0.07-->0.54-->0.50, influenza negative, K+ 4.2, glucose 278, SCcr 0.86, LFT normal, WBC 13.6, HGB 13. In the ED she was given albuterol neb, along with her PTA medications. Upon admission, cardiology was asked to evaluate. Overnight, she had two further episodes of chest pain, though shorted in duration with the longest episode occurring around 11 PM and lasting 5 minutes. Currently, chest pain free.    Past Medical History:  Diagnosis Date  . Asthma   . Diabetes mellitus (HCC)   . Hypercholesterolemia   . Hypertension   . Osteopenia   . Polymyalgia rheumatica  syndrome (HCC)   . Reactive airway disease     Past Surgical History:  Procedure Laterality Date  . ABDOMINAL HYSTERECTOMY  1981   prolapse and bleeding, ovaries not removed  . BREAST EXCISIONAL BIOPSY Right   . UMBILICAL HERNIA REPAIR  7/94     Home Meds: Prior to Admission medications   Medication Sig Start Date End Date Taking? Authorizing Provider  ADVAIR DISKUS 250-50 MCG/DOSE AEPB INHALE ONE PUFF BY MOUTH EVERY 12 HOURS. RINSE MOUTH AFTER EACH USE 02/17/16  Yes Dale [place], MD  alendronate (FOSAMAX) 70 MG tablet 70 mg once a week.  11/06/17  Yes [provider]  aspirin 81 MG tablet Take 81 mg by mouth daily.   Yes [provider]  benzonatate (TESSALON) 200 MG capsule Take 200 mg by mouth 3 (three) times daily. 10/05/18 10/12/18 Yes [provider]  cyanocobalamin (,VITAMIN B-12,) 1000 MCG/ML injection Inject into the muscle.   Yes [provider]  doxycycline (VIBRAMYCIN) 100 MG capsule Take 100 mg by mouth 2 (two) times daily. 10/05/18 10/15/18 Yes [provider]  fluticasone (FLONASE) 50 MCG/ACT nasal spray USE 2 SPRAY(S) IN EACH NOSTRIL ONCE DAILY 08/27/18  Yes Dale [place], MD  lisinopril (PRINIVIL,ZESTRIL) 20 MG tablet Take 20 mg by mouth daily.   Yes [provider]  metFORMIN (GLUCOPHAGE) 500 MG tablet TAKE 1 TABLET BY MOUTH TWICE DAILY WITH A MEAL 10/01/18  Yes Dale [place], MD  Multiple Vitamins-Minerals (VISION FORMULA/LUTEIN PO) Take by mouth daily.  Yes [provider]  mupirocin ointment (BACTROBAN) 2 % Apply 1 application topically 2 (two) times daily. 05/27/16  Yes Dale [place], MD  predniSONE (DELTASONE) 20 MG tablet Take 20 mg by mouth 2 (two) times daily. 10/05/18 10/10/18 Yes [provider]  PROAIR HFA 108 (90 Base) MCG/ACT inhaler Inhale 2 puffs into the lungs 4 (four) times daily as needed. 10/05/18  Yes [provider]  simvastatin (ZOCOR) 10 MG tablet TAKE 1 TABLET BY  MOUTH AT BEDTIME 05/10/18  Yes Dale [place], MD  Vitamin D, Ergocalciferol, (DRISDOL) 50000 units CAPS capsule Take 50,000 Units by mouth once a week. 06/24/18  Yes [provider]  calcium carbonate (OS-CAL) 600 MG TABS Take 600 mg by mouth daily.    [provider]    Inpatient Medications: Scheduled Meds: . albuterol  2.5 mg Nebulization Q6H  . aspirin EC  81 mg Oral Daily  . azithromycin  500 mg Oral Daily  . benzonatate  200 mg Oral TID  . calcium carbonate  500 mg Oral Daily  . doxycycline  100 mg Oral BID  . enoxaparin (LOVENOX) injection  40 mg Subcutaneous Q24H  . fluticasone  2 spray Each Nare Daily  . insulin aspart  0-15 Units Subcutaneous TID WC  . insulin aspart  0-5 Units Subcutaneous QHS  . lisinopril  20 mg Oral Daily  . mometasone-formoterol  2 puff Inhalation BID  . multivitamin with minerals  1 tablet Oral Daily  . multivitamin-lutein  1 capsule Oral Daily  . mupirocin ointment  1 application Topical BID  . predniSONE  20 mg Oral BID  . simvastatin  10 mg Oral QHS  . sodium chloride flush  3 mL Intravenous Q12H  . Vitamin D (Ergocalciferol)  50,000 Units Oral Weekly   Continuous Infusions: . sodium chloride     PRN Meds: sodium chloride, acetaminophen **OR** acetaminophen, hydrALAZINE, ipratropium-albuterol, ondansetron **OR** ondansetron (ZOFRAN) IV, polyethylene glycol, sodium chloride, sodium chloride flush, traZODone  Allergies:   Allergies  Allergen Reactions  . No Known Drug Allergy   . Tramadol Itching    Nausea    Social History:   Social History   Socioeconomic History  . Marital status: Widowed    Spouse name: Not on file  . Number of children: 3  . Years of education: Not on file  . Highest education level: Not on file  Occupational History  . Not on file  Social Needs  . Financial resource strain: Not hard at all  . Food insecurity:    Worry: Never true    Inability: Never true  . Transportation needs:     Medical: No    Non-medical: No  Tobacco Use  . Smoking status: Never Smoker  . Smokeless tobacco: Never Used  Substance and Sexual Activity  . Alcohol use: No    Alcohol/week: 0.0 standard drinks  . Drug use: No  . Sexual activity: Never  Lifestyle  . Physical activity:    Days per week: Not on file    Minutes per session: Not on file  . Stress: Not on file  Relationships  . Social connections:    Talks on phone: Not on file    Gets together: Not on file    Attends religious service: Not on file    Active member of club or organization: Not on file    Attends meetings of clubs or organizations: Not on file    Relationship status: Not on file  . Intimate  partner violence:    Fear of current or ex partner: Not on file    Emotionally abused: Not on file    Physically abused: Not on file    Forced sexual activity: Not on file  Other Topics Concern  . Not on file  Social History Narrative  . Not on file     Family History:  Family History  Problem Relation Age of Onset  . Heart attack Father   . Arthritis Mother   . Heart disease Mother   . Throat cancer Sister     ROS:  Review of Systems  Constitutional: Positive for malaise/fatigue. Negative for chills, diaphoresis, fever and weight loss.       Shakiness   HENT: Negative for congestion.   Eyes: Negative for discharge and redness.  Respiratory: Positive for shortness of breath. Negative for cough, hemoptysis, sputum production and wheezing.   Cardiovascular: Positive for chest pain. Negative for palpitations, orthopnea, claudication, leg swelling and PND.  Gastrointestinal: Negative for abdominal pain, blood in stool, heartburn, melena, nausea and vomiting.  Genitourinary: Negative for hematuria.  Musculoskeletal: Negative for falls and myalgias.  Skin: Negative for rash.  Neurological: Positive for weakness. Negative for dizziness, tingling, tremors, sensory change, speech change, focal weakness and loss of  consciousness.  Endo/Heme/Allergies: Does not bruise/bleed easily.  Psychiatric/Behavioral: Negative for substance abuse. The patient is not nervous/anxious.   All other systems reviewed and are negative.     Physical Exam/Data:   Vitals:   10/07/18 1930 10/07/18 2009 10/07/18 2118 10/08/18 0624  BP: (!) 132/58 (!) 151/75  118/67  Pulse: 91 85  72  Resp: (!) 22 19  18   Temp:  99 F (37.2 C)  98.1 F (36.7 C)  TempSrc:  Oral  Oral  SpO2: 95% 97% 95% 96%  Weight:      Height:        Intake/Output Summary (Last 24 hours) at 10/08/2018 0700 Last data filed at 10/07/2018 2200 Gross per 24 hour  Intake 3 ml  Output -  Net 3 ml   Filed Weights   10/07/18 1403  Weight: 54 kg   Body mass index is 22.48 kg/m.   Physical Exam: General: Well developed, well nourished, in no acute distress. Head: Normocephalic, atraumatic, sclera non-icteric, no xanthomas, nares without discharge.  Neck: Negative for carotid bruits. JVD not elevated. Lungs: Clear bilaterally to auscultation without wheezes, rales, or rhonchi. Breathing is unlabored. Heart: RRR with S1 S2. No murmurs, rubs, or gallops appreciated. Abdomen: Soft, non-tender, non-distended with normoactive bowel sounds. No hepatomegaly. No rebound/guarding. No obvious abdominal masses. Msk:  Strength and tone appear normal for age. Extremities: No clubbing or cyanosis. No edema. Distal pedal pulses are 2+ and equal bilaterally. Neuro: Alert and oriented X 3. No facial asymmetry. No focal deficit. Moves all extremities spontaneously. Psych:  Responds to questions appropriately with a normal affect.   EKG:  The EKG was personally reviewed and demonstrates: NSR, 89 bpm, PVCs, baseline wandering, no acute st/t changes Telemetry:  Telemetry was personally reviewed and demonstrates: NSR, occasional PVCs, 4 bet run of narrow complex tachycardia  Weights: Filed Weights   10/07/18 1403  Weight: 54 kg    Relevant CV  Studies: none  Laboratory Data:  Chemistry Recent Labs  Lab 10/07/18 1443  NA 139  K 4.2  CL 105  CO2 19*  GLUCOSE 278*  BUN 19  CREATININE 0.86  CALCIUM 9.4  GFRNONAA >60  GFRAA >60  ANIONGAP 15  Recent Labs  Lab 10/07/18 1443  PROT 7.0  ALBUMIN 3.9  AST 31  ALT 12  ALKPHOS 49  BILITOT 0.6   Hematology Recent Labs  Lab 10/07/18 1411  WBC 13.6*  RBC 4.67  HGB 13.0  HCT 40.7  MCV 87.2  MCH 27.8  MCHC 31.9  RDW 15.1  PLT 353   Cardiac Enzymes Recent Labs  Lab 10/07/18 1443 10/07/18 2022 10/08/18 0154  TROPONINI 0.07* 0.54* 0.50*   No results for input(s): TROPIPOC in the last 168 hours.  BNPNo results for input(s): BNP, PROBNP in the last 168 hours.  DDimer No results for input(s): DDIMER in the last 168 hours.  Radiology/Studies:  Dg Chest Portable 1 View  Result Date: 10/07/2018 IMPRESSION: No active disease. Electronically Signed   By: Paulina Fusi M.D.   On: 10/07/2018 14:51    Assessment and Plan:   1. NSTEMI: -Currently, chest pain free -Troponin has peaked at 0.54, now down trending -ASA -Echo pending -Did not receive IV heparin or full dose Lovenox overnight -NPO -Lopressor -Simvastatin  -Schedule for LHC with Dr. Kirke Corin this morning -She is agreeable to suspend her DNR for the cath -Risks and benefits of cardiac catheterization have been discussed with the patient including risks of bleeding, bruising, infection, kidney damage, stroke, heart attack, and death. The patient understands these risks and is willing to proceed with the procedure. All questions have been answered and concerns listened to  2. PVCs/narrow complex tachycardia: -Asymptomatic  -Check TSH and magnesium -Potassium at goal upon admission -Add Lopressor 12.5 mg bid  3. HTN: -Blood pressure currently well controlled on lisinopril  -Add metoprolol as above  4. HLD: -LDL of 99 from 01/2018 -If she is found to have significant CAD would change from  simvastatin to Lipitor  5. DM2: -Metformin on hold -Check A1c, last A1c of 6.8 from 01/2018 -Per IM  6. Leukocytosis: -No obvious sign of infection -Likely inflammatory in the setting of the above -Per IM   For questions or updates, please contact [facility] Please consult [email or web address] for contact info under Cardiology/STEMI.   Signed, Eula Listen, PA-C [facility] Pager: [phone] 10/08/2018, 7:00 AM

## 2018-10-08 NOTE — Progress Notes (Signed)
*  PRELIMINARY RESULTS* Echocardiogram 2D Echocardiogram has been performed.  Tanya Cole 10/08/2018, 9:34 AM

## 2018-10-08 NOTE — Progress Notes (Signed)
Received patient from cath lab post cardiac catheterization and stent placement,vital signs within normal limits,denies pain,cath site clean and clear,no hematoma,no bleeding,close monitoring in progress

## 2018-10-08 NOTE — Progress Notes (Signed)
[employer] [facility] [facility] [place] [facility]   PATIENT NAME: Tanya Cole    MR#:  [id number]  DATE OF BIRTH:  Jun 25, 1933  SUBJECTIVE:   Patient plan for cardiac catheterization this morning. Chest pain overnight  REVIEW OF SYSTEMS:    Review of Systems  Constitutional: Negative for fever, chills weight loss HENT: Negative for ear pain, nosebleeds, congestion, facial swelling, rhinorrhea, neck pain, neck stiffness and ear discharge.   Respiratory: Negative for cough, shortness of breath, wheezing  Cardiovascular: ++chest pain, no palpitations and leg swelling.  Gastrointestinal: Negative for heartburn, abdominal pain, vomiting, diarrhea or consitpation Genitourinary: Negative for dysuria, urgency, frequency, hematuria Musculoskeletal: Negative for back pain or joint pain Neurological: Negative for dizziness, seizures, syncope, focal weakness,  numbness and headaches.  Hematological: Does not bruise/bleed easily.  Psychiatric/Behavioral: Negative for hallucinations, confusion, dysphoric mood    Tolerating Diet:npo      DRUG ALLERGIES:   Allergies  Allergen Reactions  . No Known Drug Allergy   . Tramadol Itching    Nausea    VITALS:  Blood pressure 113/73, pulse 68, temperature 98.1 F (36.7 C), temperature source Oral, resp. rate (!) 22, height 5\' 1"  (1.549 m), weight 54 kg, SpO2 96 %.  PHYSICAL EXAMINATION:  Constitutional: Appears well-developed and well-nourished. No distress. HENT: Normocephalic. Oropharynx is clear and moist.  Eyes: Conjunctivae and EOM are normal. PERRLA, no scleral icterus.  Neck: Normal ROM. Neck supple. No JVD. No tracheal deviation. CVS: RRR, S1/S2 +, no murmurs, no gallops, no carotid bruit.  Pulmonary: Effort and breath sounds normal, no stridor, rhonchi, wheezes, rales.  Abdominal: Soft. BS +,  no distension, tenderness, rebound or guarding.  Musculoskeletal: Normal range of motion. No edema and no tenderness.  Neuro:  Alert. CN 2-12 grossly intact. No focal deficits. Skin: Skin is warm and dry. No rash noted. Psychiatric: Normal mood and affect.      LABORATORY PANEL:   CBC Recent Labs  Lab 10/07/18 1411  WBC 13.6*  HGB 13.0  HCT 40.7  PLT 353   ------------------------------------------------------------------------------------------------------------------  Chemistries  Recent Labs  Lab 10/07/18 1443  NA 139  K 4.2  CL 105  CO2 19*  GLUCOSE 278*  BUN 19  CREATININE 0.86  CALCIUM 9.4  AST 31  ALT 12  ALKPHOS 49  BILITOT 0.6   ------------------------------------------------------------------------------------------------------------------  Cardiac Enzymes Recent Labs  Lab 10/07/18 1443 10/07/18 2022 10/08/18 0154  TROPONINI 0.07* 0.54* 0.50*   ------------------------------------------------------------------------------------------------------------------  RADIOLOGY:  Dg Chest Portable 1 View  Result Date: 10/07/2018 CLINICAL DATA:  Central chest pain beginning today. EXAM: PORTABLE CHEST 1 VIEW COMPARISON:  CT 02/11/2010 FINDINGS: Heart size is normal. Chronic aortic atherosclerosis. Chronic calcified granuloma at the left base laterally. The remainder the chest is clear. IMPRESSION: No active disease. Electronically Signed   By: 02/13/2010 M.D.   On: 10/07/2018 14:51     ASSESSMENT AND PLAN:   84 year old female with hypertension who presents with chest pain.  1.  Non-STEMI: Troponin peaked 0.54 and now trending down. Plan for cardiac catheterization and further recommendations after cath.  2.  Essential hypertension: Continue lisinopril and metoprolol  3.  Hyperlipidemia: Continue statin  4.  Diabetes: Continue current regimen and hold metformin for now.  5.  Asthma/COPD without signs of exacerbation    Management plans discussed with the patient and daughter and she is in agreement.  CODE STATUS: DNR  TOTAL TIME TAKING CARE OF THIS PATIENT: 30  minutes.  POSSIBLE D/C tomorrow, DEPENDING ON CLINICAL CONDITION.   Sarvesh Meddaugh M.D on 10/08/2018 at 1:10 PM  Between 7am to 6pm - Pager - [phone] After 6pm go to [email or web address] - password [email or web address]  [facility]  Office  [phone]  CC: Primary care physician; Dale Livingston, MD  Note: This dictation was prepared with Dragon dictation along with smaller phrase technology. Any transcriptional errors that result from this process are unintentional.

## 2018-10-09 ENCOUNTER — Encounter: Payer: Self-pay | Admitting: Cardiovascular Disease

## 2018-10-09 ENCOUNTER — Telehealth: Payer: Self-pay

## 2018-10-09 LAB — CBC
HCT: 36.7 % (ref 36.0–46.0)
Hemoglobin: 11.7 g/dL — ABNORMAL LOW (ref 12.0–15.0)
MCH: 28 pg (ref 26.0–34.0)
MCHC: 31.9 g/dL (ref 30.0–36.0)
MCV: 87.8 fL (ref 80.0–100.0)
Platelets: 309 10*3/uL (ref 150–400)
RBC: 4.18 MIL/uL (ref 3.87–5.11)
RDW: 15.5 % (ref 11.5–15.5)
WBC: 14 10*3/uL — ABNORMAL HIGH (ref 4.0–10.5)
nRBC: 0 % (ref 0.0–0.2)

## 2018-10-09 LAB — BASIC METABOLIC PANEL
Anion gap: 9 (ref 5–15)
BUN: 25 mg/dL — ABNORMAL HIGH (ref 8–23)
CO2: 22 mmol/L (ref 22–32)
Calcium: 8.4 mg/dL — ABNORMAL LOW (ref 8.9–10.3)
Chloride: 109 mmol/L (ref 98–111)
Creatinine, Ser: 0.72 mg/dL (ref 0.44–1.00)
GFR calc Af Amer: >60 mL/min (ref >60)
Glucose, Bld: 178 mg/dL — ABNORMAL HIGH (ref 70–99)
Potassium: 4.2 mmol/L (ref 3.5–5.1)
Sodium: 140 mmol/L (ref 135–145)

## 2018-10-09 LAB — GLUCOSE, CAPILLARY
Glucose-Capillary: 154 mg/dL — ABNORMAL HIGH (ref 70–99)
Glucose-Capillary: 137 mg/dL — ABNORMAL HIGH (ref 70–99)

## 2018-10-09 MED ORDER — METOPROLOL TARTRATE 25 MG PO TABS: 12.5000 mg | ORAL_TABLET | Freq: Two times a day (BID) | ORAL | 0 refills | Status: DC

## 2018-10-09 MED ORDER — CLOPIDOGREL BISULFATE 75 MG PO TABS: 75.0000 mg | ORAL_TABLET | Freq: Every day | ORAL | 0 refills | Status: DC

## 2018-10-09 MED ORDER — ATORVASTATIN CALCIUM 40 MG PO TABS: 40.0000 mg | ORAL_TABLET | Freq: Every day | ORAL | 0 refills | Status: DC

## 2018-10-09 MED ORDER — NITROGLYCERIN 0.4 MG SL SUBL: 0.4000 mg | SUBLINGUAL_TABLET | SUBLINGUAL | 12 refills | Status: DC | PRN

## 2018-10-09 MED ORDER — AZITHROMYCIN 250 MG PO TABS: ORAL_TABLET | ORAL | 0 refills | Status: DC

## 2018-10-09 NOTE — Progress Notes (Signed)
Progress Note  Patient Name: Tanya Cole Date of Encounter: 10/09/2018  Primary Cardiologist: [facility] - [facility], Dr. Kirke Corin  Subjective   S/p LHC on 10/08/2018 d/t STEMI. Patient denies any cardiac symptoms including chest pain, palpitations, or feeling of racing heart rate.  R radial catheterization site with ecchymosis but otherwise clean, dry, and without signs of infection.   Inpatient Medications    Scheduled Meds: . albuterol  2.5 mg Nebulization BID  . aspirin EC  81 mg Oral Daily  . atorvastatin  40 mg Oral q1800  . azithromycin  500 mg Oral Daily  . benzonatate  200 mg Oral TID  . calcium carbonate  500 mg Oral Daily  . clopidogrel  75 mg Oral Q breakfast  . doxycycline  100 mg Oral BID  . enoxaparin (LOVENOX) injection  40 mg Subcutaneous Q24H  . fluticasone  2 spray Each Nare Daily  . insulin aspart  0-15 Units Subcutaneous TID WC  . insulin aspart  0-5 Units Subcutaneous QHS  . lisinopril  20 mg Oral Daily  . metoprolol tartrate  12.5 mg Oral BID  . mometasone-formoterol  2 puff Inhalation BID  . multivitamin with minerals  1 tablet Oral Daily  . multivitamin-lutein  1 capsule Oral Daily  . mupirocin ointment  1 application Topical BID  . predniSONE  20 mg Oral BID  . sodium chloride flush  3 mL Intravenous Q12H  . sodium chloride flush  3 mL Intravenous Q12H  . Vitamin D (Ergocalciferol)  50,000 Units Oral Weekly   Continuous Infusions: . sodium chloride    . sodium chloride     PRN Meds: sodium chloride, sodium chloride, acetaminophen **OR** acetaminophen, alum & mag hydroxide-simeth, ipratropium-albuterol, ondansetron **OR** ondansetron (ZOFRAN) IV, polyethylene glycol, sodium chloride, sodium chloride flush, sodium chloride flush, traZODone   Vital Signs    Vitals:   10/09/18 0441 10/09/18 0743 10/09/18 0817 10/09/18 0817  BP: 134/76  (!) 132/95 (!) 132/95  Pulse: 73  97 90  Resp:      Temp: 98 F (36.7 C)  98.4 F (36.9 C) 98.4 F (36.9 C)    TempSrc: Oral  Oral Oral  SpO2: 97% 95% 98% 98%  Weight:      Height:        Intake/Output Summary (Last 24 hours) at 10/09/2018 1042 Last data filed at 10/09/2018 0800 Gross per 24 hour  Intake 1057 ml  Output 500 ml  Net 557 ml   Filed Weights   10/07/18 1403  Weight: 54 kg    Telemetry    NSR with ectopy and sinus pauses - Personally Reviewed  ECG    73 bpm, Low voltage EKG, prolonged QTc, TWI, ST depression / ischemia V2-V6 and s/p LHC, p wave inversion Personally Reviewed  Physical Exam   GEN: Elderly female, no acute distress.  Accompanied by several family members Neck: No JVD Cardiac: RRR, no murmurs, rubs, or gallops.  Respiratory: Clear to auscultation bilaterally. GI: Soft, nontender, non-distended  MS: No edema; No deformity. R radial cath site with ecchymosis and antecubital with ecchymosis. Cardiac catheterization site clean, dry, and with no signs of infection Neuro:  Nonfocal  Psych: Normal, pleasant affect   Labs    Chemistry Recent Labs  Lab 10/07/18 1443 10/09/18 0654  NA 139 140  K 4.2 4.2  CL 105 109  CO2 19* 22  GLUCOSE 278* 178*  BUN 19 25*  CREATININE 0.86 0.72  CALCIUM 9.4 8.4*  PROT 7.0  --  ALBUMIN 3.9  --   AST 31  --   ALT 12  --   ALKPHOS 49  --   BILITOT 0.6  --   GFRNONAA >60 >60  GFRAA >60 >60  ANIONGAP 15 9     Hematology Recent Labs  Lab 10/07/18 1411 10/09/18 0654  WBC 13.6* 14.0*  RBC 4.67 4.18  HGB 13.0 11.7*  HCT 40.7 36.7  MCV 87.2 87.8  MCH 27.8 28.0  MCHC 31.9 31.9  RDW 15.1 15.5  PLT 353 309    Cardiac Enzymes Recent Labs  Lab 10/07/18 1443 10/07/18 2022 10/08/18 0154 10/08/18 1656  TROPONINI 0.07* 0.54* 0.50* 0.71*   No results for input(s): TROPIPOC in the last 168 hours.   BNPNo results for input(s): BNP, PROBNP in the last 168 hours.   DDimer No results for input(s): DDIMER in the last 168 hours.   Radiology    Dg Chest Portable 1 View  Result Date: 10/07/2018 CLINICAL  DATA:  Central chest pain beginning today. EXAM: PORTABLE CHEST 1 VIEW COMPARISON:  CT 02/11/2010 FINDINGS: Heart size is normal. Chronic aortic atherosclerosis. Chronic calcified granuloma at the left base laterally. The remainder the chest is clear. IMPRESSION: No active disease. Electronically Signed   By: Paulina Fusi M.D.   On: 10/07/2018 14:51    Cardiac Studies   10/08/2018 LHC  A drug-eluting stent was successfully placed using a STENT RESOLUTE ONYX 2.5X15.  Mid LAD-1 lesion is 95% stenosed.  Post intervention, there is a 0% residual stenosis.  Mid LAD-2 lesion is 60% stenosed.  The left ventricular systolic function is normal.  LV end diastolic pressure is mildly elevated.  The left ventricular ejection fraction is 55-65% by visual estimate.   1.  Severe one-vessel coronary artery disease involving mid LAD. 2.  Normal LV systolic function and mildly elevated left ventricular end-diastolic pressure. 3.  Successful angioplasty and drug-eluting stent placement to the mid LAD.  Recommendations: Aggressive treatment of risk factors.  I switch simvastatin to atorvastatin. Dual antiplatelet therapy for at least one year. Possible discharge home tomorrow. Coronary Diagrams   Diagnostic  Dominance: Right    Intervention        10/08/2018 TTE Study Conclusions - Left ventricle: The cavity size was normal. Wall thickness was   normal. Systolic function was normal. The estimated ejection   fraction was in the range of 55% to 60%. Probable hypokinesis of   the mid-apicalanteroseptal myocardium. Doppler parameters are   consistent with abnormal left ventricular relaxation (grade 1   diastolic dysfunction). - Aortic valve: There was mild regurgitation. - Left atrium: The atrium was mildly dilated. - Pulmonary arteries: Systolic pressure could not be accurately   estimated.  Patient Profile     84 y.o. female with history of DM 2, PMR, asthma, HTN, HLD, and osteopenia  who is being seen s/p left heart cardiac catheterization for NSTEMI.  Assessment & Plan    NSTEMI  - s/p 10/08/18 LHC -Currently chest pain-free and denies any symptoms of palpitations or racing heart rate --Troponin peaked at 0.71.  EKG as above - itial EKG - SR with PVCs and nonspecific ST/T wave changes - 12/9 Echo as above in CV studies with normal EF, G1DD --12/9 LHC with PCI on 10/08/2018 with results as above showing severe one-vessel coronary artery disease involving mid LAD, normal systolic function and mildly elevated LVEDP with success with successful DES placement to the mLAD  - Recommendations for aggressive treatment of risk factors  with switch from simvastatin to atorvastatin. Continue DAPT with ASA 81mg  daily, plavix 75mg  daily for at least 58mo. Continue BB with lopressor 12.5mg  BID with consideration for possible consolidation to Toprol XL before discharge. Continue SL nitro for CP.  - R radial cath site with ecchymosis but otherwise clean, dry, and without infection. Labs reviewed with renal function and hemoglobin stable following cardiac cath. Pending MD to see patient before discharge. Will need follow-up TCM outpatient appointment within 2 weeks of discharge.  PVCs/narrow complex tachycardia  --Asymptomatic --Telemetry significant for ectopy and sinus pauses with rates 80s-90s --12/9 TSH 1.141, Mg 1.8, K 4.2 - Continue metoprolol as above with possible consolidation to Toprol XL as above before discharge  HTN --BP 132/95, HR 90 -- Continue medical management with lisinopril 20mg  daily, metoprolol . If HR and BP remain elevated at TCM, consider uptitration of metoprolol for optimal BP at that time and if HR allows. Patient hypotension yesterday and elevated BP today - still somewhat labile BP s/p cath.  --Continue to monitor  HLD --LDL 99 01/2018 --Changed to Lipitor 40mg  daily with follow-up lipid and liver labs recommended and uptitration to 80mg  daily as tolerated for  better lipid control --LDL goal <70  DM2 --Continue to hold metformin for 48h s/p catheterization.  --Can restart metformin on 10/10/2018.  --A1C 6.6.  --Recommend follow-up per PCP for control of DM2  For questions or updates, please contact [facility] Please consult [email or web address] for contact info under     Signed, , PA-C  10/09/2018, 10:42 AM

## 2018-10-09 NOTE — Telephone Encounter (Signed)
Copied from CRM [id number]. Topic: Appointment Scheduling - Scheduling Inquiry for Clinic >> Oct 09, 2018 10:23 AM Windy Kalata, NT wrote: Reason for CRM: [facility] is calling to schedule patient a one week hospital follow up. Patient is being discharged today. Please contact to schedule with Dr. Lorin Picket has her schedule is booked.

## 2018-10-09 NOTE — Discharge Summary (Addendum)
[facility] [place] at [facility]   PATIENT NAME: Tanya Cole    MR#:  [id number]  DATE OF BIRTH:  May 16, 1933  DATE OF ADMISSION:  10/07/2018 ADMITTING PHYSICIAN: Bertrum Sol, MD  DATE OF DISCHARGE: 10/09/2018  PRIMARY CARE PHYSICIAN: Tanya Pinal, MD    ADMISSION DIAGNOSIS:  Atypical chest pain [R07.89] Elevated troponin [R79.89]  DISCHARGE DIAGNOSIS:  Active Problems:   Chest pain   Non-ST elevation (NSTEMI) myocardial infarction [facility])   SECONDARY DIAGNOSIS:   Past Medical History:  Diagnosis Date  . Asthma   . Diabetes mellitus (HCC)   . Hypercholesterolemia   . Hypertension   . Osteopenia   . Polymyalgia rheumatica syndrome (HCC)   . Reactive airway disease     HOSPITAL COURSE:  84 year old female with hypertension who presents with chest pain.  1.  Non-STEMI: Troponin peaked at 0.54. She underwent cardiac catheterization which showed  A drug-eluting stent was successfully placed using a STENT RESOLUTE ONYX 2.5X15.  Mid LAD-1 lesion is 95% stenosed.  Post intervention, there is a 0% residual stenosis.  Mid LAD-2 lesion is 60% stenosed.  The left ventricular systolic function is normal.  LV end diastolic pressure is mildly elevated.  The left ventricular ejection fraction is 55-65% by visual estimate.   1.  Severe one-vessel coronary artery disease involving mid LAD. 2.  Normal LV systolic function and mildly elevated left ventricular end-diastolic pressure. 3.  Successful angioplasty and drug-eluting stent placement to the mid LAD.  She will be discharged on Bluemel nitroglycerin as needed, statin, aspirin, Plavix, metoprolol and lisinopril.  She was referred to cardiac rehab upon discharge.  She will follow-up with cardiology in 1 week.    2.  Essential hypertension: Continue lisinopril and metoprolol  3.  Hyperlipidemia: She was switched to atorvastatin.  4.  Diabetes: Continue patient regimen with ADA diet.  5.   Asthma/COPD without signs of exacerbation 6.  Sinusitis: Patient will continue on azithromycin.   DISCHARGE CONDITIONS AND DIET:   Stable for discharge on heart healthy diabetic diet  CONSULTS OBTAINED:  Treatment Team:  Yvonne Kendall, MD  DRUG ALLERGIES:   Allergies  Allergen Reactions  . No Known Drug Allergy   . Tramadol Itching    Nausea    DISCHARGE MEDICATIONS:   Allergies as of 10/09/2018      Reactions   No Known Drug Allergy    Tramadol Itching   Nausea      Medication List    STOP taking these medications   doxycycline 100 MG capsule Commonly known as:  VIBRAMYCIN   simvastatin 10 MG tablet Commonly known as:  ZOCOR     TAKE these medications   ADVAIR DISKUS 250-50 MCG/DOSE Aepb Generic drug:  Fluticasone-Salmeterol INHALE ONE PUFF BY MOUTH EVERY 12 HOURS. RINSE MOUTH AFTER EACH USE   alendronate 70 MG tablet Commonly known as:  FOSAMAX 70 mg once a week.   aspirin 81 MG tablet Take 81 mg by mouth daily.   atorvastatin 40 MG tablet Commonly known as:  LIPITOR Take 1 tablet (40 mg total) by mouth daily at 6 PM.   azithromycin 250 MG tablet Commonly known as:  ZITHROMAX Take 2 tablets daily for 3 days for treatment for sinusitis. Start taking on:  10/10/2018   benzonatate 200 MG capsule Commonly known as:  TESSALON Take 200 mg by mouth 3 (three) times daily.   calcium carbonate 600 MG Tabs tablet Commonly known as:  OS-CAL Take 600 mg  by mouth daily.   clopidogrel 75 MG tablet Commonly known as:  PLAVIX Take 1 tablet (75 mg total) by mouth daily with breakfast. Start taking on:  10/10/2018   cyanocobalamin 1000 MCG/ML injection Commonly known as:  (VITAMIN B-12) Inject into the muscle.   fluticasone 50 MCG/ACT nasal spray Commonly known as:  FLONASE USE 2 SPRAY(S) IN EACH NOSTRIL ONCE DAILY   lisinopril 20 MG tablet Commonly known as:  PRINIVIL,ZESTRIL Take 20 mg by mouth daily.   metFORMIN 500 MG tablet Commonly known  as:  GLUCOPHAGE TAKE 1 TABLET BY MOUTH TWICE DAILY WITH A MEAL   metoprolol tartrate 25 MG tablet Commonly known as:  LOPRESSOR Take 0.5 tablets (12.5 mg total) by mouth 2 (two) times daily.   mupirocin ointment 2 % Commonly known as:  BACTROBAN Apply 1 application topically 2 (two) times daily.   nitroGLYCERIN 0.4 MG SL tablet Commonly known as:  NITROSTAT Place 1 tablet (0.4 mg total) under the tongue every 5 (five) minutes as needed for chest pain.   predniSONE 20 MG tablet Commonly known as:  DELTASONE Take 20 mg by mouth 2 (two) times daily.   PROAIR HFA 108 (90 Base) MCG/ACT inhaler Generic drug:  albuterol Inhale 2 puffs into the lungs 4 (four) times daily as needed.   VISION FORMULA/LUTEIN PO Take by mouth daily.   Vitamin D (Ergocalciferol) 1.25 MG (50000 UT) Caps capsule Commonly known as:  DRISDOL Take 50,000 Units by mouth once a week.         Today   CHIEF COMPLAINT:  No chest pain overnight   VITAL SIGNS:  Blood pressure (!) 132/95, pulse 90, temperature 98.4 F (36.9 C), temperature source Oral, resp. rate (!) 25, height 5\' 1"  (1.549 m), weight 54 kg, SpO2 98 %.   REVIEW OF SYSTEMS:  Review of Systems  Constitutional: Negative.  Negative for chills, fever and malaise/fatigue.  HENT: Negative.  Negative for ear discharge, ear pain, hearing loss, nosebleeds and sore throat.   Eyes: Negative.  Negative for blurred vision and pain.  Respiratory: Negative.  Negative for cough, hemoptysis, shortness of breath and wheezing.   Cardiovascular: Negative.  Negative for chest pain, palpitations and leg swelling.  Gastrointestinal: Negative.  Negative for abdominal pain, blood in stool, diarrhea, nausea and vomiting.  Genitourinary: Negative.  Negative for dysuria.  Musculoskeletal: Negative.  Negative for back pain.  Skin: Negative.   Neurological: Negative for dizziness, tremors, speech change, focal weakness, seizures and headaches.   Endo/Heme/Allergies: Negative.  Does not bruise/bleed easily.  Psychiatric/Behavioral: Negative.  Negative for depression, hallucinations and suicidal ideas.     PHYSICAL EXAMINATION:  GENERAL:  84 y.o.-year-old patient lying in the bed with no acute distress.  NECK:  Supple, no jugular venous distention. No thyroid enlargement, no tenderness.  LUNGS: Normal breath sounds bilaterally, no wheezing, rales,rhonchi  No use of accessory muscles of respiration.  CARDIOVASCULAR: S1, S2 normal. No murmurs, rubs, or gallops.  ABDOMEN: Soft, non-tender, non-distended. Bowel sounds present. No organomegaly or mass.  EXTREMITIES: No pedal edema, cyanosis, or clubbing.  PSYCHIATRIC: The patient is alert and oriented x 3.  SKIN: No obvious rash, lesion, or ulcer.   DATA REVIEW:   CBC Recent Labs  Lab 10/09/18 0654  WBC 14.0*  HGB 11.7*  HCT 36.7  PLT 309    Chemistries  Recent Labs  Lab 10/07/18 1443 10/08/18 1656 10/09/18 0654  NA 139  --  140  K 4.2  --  4.2  CL 105  --  109  CO2 19*  --  22  GLUCOSE 278*  --  178*  BUN 19  --  25*  CREATININE 0.86  --  0.72  CALCIUM 9.4  --  8.4*  MG  --  1.8  --   AST 31  --   --   ALT 12  --   --   ALKPHOS 49  --   --   BILITOT 0.6  --   --     Cardiac Enzymes Recent Labs  Lab 10/07/18 2022 10/08/18 0154 10/08/18 1656  TROPONINI 0.54* 0.50* 0.71*    Microbiology Results  @MICRORSLT48 @  RADIOLOGY:  Dg Chest Portable 1 View  Result Date: 10/07/2018 CLINICAL DATA:  Central chest pain beginning today. EXAM: PORTABLE CHEST 1 VIEW COMPARISON:  CT 02/11/2010 FINDINGS: Heart size is normal. Chronic aortic atherosclerosis. Chronic calcified granuloma at the left base laterally. The remainder the chest is clear. IMPRESSION: No active disease. Electronically Signed   By: 02/13/2010 M.D.   On: 10/07/2018 14:51      Allergies as of 10/09/2018      Reactions   No Known Drug Allergy    Tramadol Itching   Nausea      Medication  List    STOP taking these medications   doxycycline 100 MG capsule Commonly known as:  VIBRAMYCIN   simvastatin 10 MG tablet Commonly known as:  ZOCOR     TAKE these medications   ADVAIR DISKUS 250-50 MCG/DOSE Aepb Generic drug:  Fluticasone-Salmeterol INHALE ONE PUFF BY MOUTH EVERY 12 HOURS. RINSE MOUTH AFTER EACH USE   alendronate 70 MG tablet Commonly known as:  FOSAMAX 70 mg once a week.   aspirin 81 MG tablet Take 81 mg by mouth daily.   atorvastatin 40 MG tablet Commonly known as:  LIPITOR Take 1 tablet (40 mg total) by mouth daily at 6 PM.   azithromycin 250 MG tablet Commonly known as:  ZITHROMAX Take 2 tablets daily for 3 days for treatment for sinusitis. Start taking on:  10/10/2018   benzonatate 200 MG capsule Commonly known as:  TESSALON Take 200 mg by mouth 3 (three) times daily.   calcium carbonate 600 MG Tabs tablet Commonly known as:  OS-CAL Take 600 mg by mouth daily.   clopidogrel 75 MG tablet Commonly known as:  PLAVIX Take 1 tablet (75 mg total) by mouth daily with breakfast. Start taking on:  10/10/2018   cyanocobalamin 1000 MCG/ML injection Commonly known as:  (VITAMIN B-12) Inject into the muscle.   fluticasone 50 MCG/ACT nasal spray Commonly known as:  FLONASE USE 2 SPRAY(S) IN EACH NOSTRIL ONCE DAILY   lisinopril 20 MG tablet Commonly known as:  PRINIVIL,ZESTRIL Take 20 mg by mouth daily.   metFORMIN 500 MG tablet Commonly known as:  GLUCOPHAGE TAKE 1 TABLET BY MOUTH TWICE DAILY WITH A MEAL   metoprolol tartrate 25 MG tablet Commonly known as:  LOPRESSOR Take 0.5 tablets (12.5 mg total) by mouth 2 (two) times daily.   mupirocin ointment 2 % Commonly known as:  BACTROBAN Apply 1 application topically 2 (two) times daily.   nitroGLYCERIN 0.4 MG SL tablet Commonly known as:  NITROSTAT Place 1 tablet (0.4 mg total) under the tongue every 5 (five) minutes as needed for chest pain.   predniSONE 20 MG tablet Commonly known as:   DELTASONE Take 20 mg by mouth 2 (two) times daily.   PROAIR HFA 108 (90 Base) MCG/ACT inhaler  Generic drug:  albuterol Inhale 2 puffs into the lungs 4 (four) times daily as needed.   VISION FORMULA/LUTEIN PO Take by mouth daily.   Vitamin D (Ergocalciferol) 1.25 MG (50000 UT) Caps capsule Commonly known as:  DRISDOL Take 50,000 Units by mouth once a week.          Management plans discussed with the patient and she is in agreement. Stable for discharge home  Patient should follow up with cardiology  CODE STATUS:     Code Status Orders  (From admission, onward)         Start     Ordered   10/07/18 1819  Do not attempt resuscitation (DNR)  Continuous    Question Answer Comment  In the event of cardiac or respiratory ARREST Do not call a "code blue"   In the event of cardiac or respiratory ARREST Do not perform Intubation, CPR, defibrillation or ACLS   In the event of cardiac or respiratory ARREST Use medication by any route, position, wound care, and other measures to relive pain and suffering. May use oxygen, suction and manual treatment of airway obstruction as needed for comfort.   Comments nurse to pronounce      10/07/18 1818        Code Status History    Date Active Date Inactive Code Status Order ID Comments User Context   10/07/2018 1818 10/07/2018 1818 Full Code [id number]  Tanya Asa, MD ED    Advance Directive Documentation     Most Recent Value  Type of Advance Directive  Living will, Healthcare Power of Attorney  Pre-existing out of facility DNR order (yellow form or pink MOST form)  Yellow form placed in chart (order not valid for inpatient use)  "MOST" Form in Place?  -      TOTAL TIME TAKING CARE OF THIS PATIENT: 38 minutes.    Note: This dictation was prepared with Dragon dictation along with smaller phrase technology. Any transcriptional errors that result from this process are unintentional.  Tanya Cole M.D on 10/09/2018 at 10:08  AM  Between 7am to 6pm - Pager - [phone] After 6pm go to [email or web address] - Social research officer, government  [facility]  Office  [phone]  CC: Primary care physician; Tanya Flomaton, MD

## 2018-10-09 NOTE — Progress Notes (Signed)
Patient discharged home as ordered,instructions explained and well understood,prescriptions handed to patient,vital signs within normal limits,escorted by daughter and staff member via wheel chair

## 2018-10-09 NOTE — Progress Notes (Signed)
Cardiovascular and Pulmonary Nurse Navigator Note:    84 year old female with hx of asthma, DM, HLD, HTN, osteopenia, reactive airway disease who presented to the ED with chest pain.  Patient ruled in for NSTEMI.  Patient underwent Cardiac Catheterization yesterday and found to have blockage in mid LAD.    Left Heart Cath and Coronary Angiography Conclusion 10/08/2018 ---- Dr. Kirke Corin     A drug-eluting stent was successfully placed using a STENT RESOLUTE ONYX 2.5X15.  Mid LAD-1 lesion is 95% stenosed.  Post intervention, there is a 0% residual stenosis.  Mid LAD-2 lesion is 60% stenosed.  The left ventricular systolic function is normal.  LV end diastolic pressure is mildly elevated.  The left ventricular ejection fraction is 55-65% by visual estimate.   1.  Severe one-vessel coronary artery disease involving mid LAD. 2.  Normal LV systolic function and mildly elevated left ventricular end-diastolic pressure. 3.  Successful angioplasty and drug-eluting stent placement to the mid LAD.  Recommendations: Aggressive treatment of risk factors.  I switch simvastatin to atorvastatin. Dual antiplatelet therapy for at least one year.   EDUCATION:   Patient lying in bed with three adult children at bedside.  Patient gave verbal permission for this RN to speak about her medical  information in front of her children.    Heart Attack Bouncing Back" booklet given and reviewed with patient. Discussed the definition of CAD. Reviewed the location of CAD and where her stent was placed. Informed patient she will be given a stent card. Explained the purpose of the stent card. Instructed patient to keep stent card in her wallet.  ? Discussed modifiable risk factors including controlling blood pressure, cholesterol, and blood sugar; following heart healthy diet; maintaining healthy weight; exercise; and smoking cessation, if applicable.  ? Discussed cardiac medications including rationale for  taking, mechanisms of action, and side effects. Stressed the importance of taking medications as prescribed.  ? Discussed emergency plan for heart attack symptoms. Patient verbalized understanding of need to call 911 and not to drive herself to ER if having cardiac symptoms / chest pain.  ? Heart healthy diet of low sodium, low fat, low cholesterol / carb modified heart healthy diet discussed. Information on diet provided.   ? Smoking Cessation - Patient is a NEVER smoker.   ? Exercise - Benefits of exercised discussed.  Informed patient that his cardiologist has referred her to outpatient Cardiac Rehab. An overview of the program was provided. Brochure, informational letter, class and orientation times, and CPT billing codes given to patient. Patient does not drive.  Daughter would have to bring patient to a morning class because daughter works in the afternoon.  Patient has [employer] and [employer].  Patient and family will discuss.  Patient agreeable to being called by Cardiac Rehab dept in one week.   ? Patient / family appreciative of the information.  ? Army Melia, RN, BSN, [facility]  [facility] Cardiac [facility] Pulmonary Rehab  Cardiovascular & Pulmonary Nurse Navigator  Direct Line: [phone]  Department Phone #: [phone] Fax: [phone]  Email Address: [email or web address]

## 2018-10-10 ENCOUNTER — Telehealth: Payer: Self-pay | Admitting: *Deleted

## 2018-10-10 NOTE — Telephone Encounter (Signed)
I am not here end of this week and beginning of next week.  Since she is not able to come in 10/11/18, will need to hold for appt and then can see where can work in.  See me about this.

## 2018-10-10 NOTE — Telephone Encounter (Signed)
Copied from CRM [id number]. Topic: Appointment Scheduling - Scheduling Inquiry for Clinic >> Oct 10, 2018 11:49 AM Herby Abraham C wrote: Reason for CRM: pt was discharged from the hospital yesterday and advised to be seen by PCP in 1 week. Not showing any openings.   Please assist with scheduling.   CB: [phone] 14 / daughter

## 2018-10-10 NOTE — Telephone Encounter (Signed)
Transition Care Management Follow-up Telephone Call  How have you been since you were released from the hospital? Patient feeling better than she was for what she has been through, patient would like appointment as soon as possible.   Do you understand why you were in the hospital? yes   Do you understand the discharge instrcutions? yes  Items Reviewed:  Medications reviewed: yes  Allergies reviewed: yes  Dietary changes reviewed: yes  Referrals reviewed: yes   Functional Questionnaire:   Activities of Daily Living (ADLs):   She states they are independent in the following: ambulation, bathing and hygiene, feeding, continence, grooming, toileting and dressing States they require assistance with the following: No assistance needed at this time.   Any transportation issues/concerns?: no   Any patient concerns? no   Confirmed importance and date/time of follow-up visits scheduled: yes   Confirmed with patient if condition begins to worsen call PCP or go to the ER.  Patient was given the Call-a-Nurse line [phone]: yes

## 2018-10-10 NOTE — Telephone Encounter (Signed)
Offered appointment on 10/11/18 patient cannot make that HFU please advise of another date and time.

## 2018-10-12 NOTE — Telephone Encounter (Signed)
Patient scheduled.

## 2018-10-16 ENCOUNTER — Ambulatory Visit (INDEPENDENT_AMBULATORY_CARE_PROVIDER_SITE_OTHER): Payer: Medicare Other | Admitting: Internal Medicine

## 2018-10-16 ENCOUNTER — Encounter: Payer: Self-pay | Admitting: Internal Medicine

## 2018-10-16 ENCOUNTER — Encounter: Payer: Self-pay | Admitting: Physician Assistant

## 2018-10-16 ENCOUNTER — Ambulatory Visit: Payer: Self-pay

## 2018-10-16 VITALS — BP 100/60 | HR 89 | Temp 99.5°F | Resp 16 | Wt 120.2 lb

## 2018-10-16 DIAGNOSIS — I10 Essential (primary) hypertension: Secondary | ICD-10-CM | POA: Diagnosis not present

## 2018-10-16 DIAGNOSIS — E1159 Type 2 diabetes mellitus with other circulatory complications: Secondary | ICD-10-CM | POA: Diagnosis not present

## 2018-10-16 DIAGNOSIS — E7800 Pure hypercholesterolemia, unspecified: Secondary | ICD-10-CM

## 2018-10-16 DIAGNOSIS — I214 Non-ST elevation (NSTEMI) myocardial infarction: Secondary | ICD-10-CM

## 2018-10-16 DIAGNOSIS — E538 Deficiency of other specified B group vitamins: Secondary | ICD-10-CM | POA: Diagnosis not present

## 2018-10-16 MED ORDER — CYANOCOBALAMIN 1000 MCG/ML IJ SOLN
1000.0000 ug | Freq: Once | INTRAMUSCULAR | Status: AC
  Administered 2018-10-16: 1000 ug via INTRAMUSCULAR

## 2018-10-16 NOTE — Progress Notes (Signed)
Cardiology Office Note Date:  10/18/2018  Patient ID:  Tanya Cole 05/23/1933, MRN [id number] PCP:  Dale [place], MD  Cardiologist:  Dr. Kirke Corin, MD    Chief Complaint: Hospital follow up  History of Present Illness: Tanya Cole is a 84 y.o. female with history of recently diagnosed CAD with a NSTEMI s/p PCI to the mid LAD, DM, HTN, HLD, asthma, PMR, ans osteopenia who presents for hospital follow up after recent admission to [facility] from 12/8 to 12/10 for a NSTEMI.   Prior to the above admission, the patient did not have any previously known cardiac history.  She had noticed a significant increase in fatigue over the past couple of weeks leading up to her admission.  She was admitted on 12/8 with sudden onset of substernal chest pain that lasted for approximately 30 minutes with associated dyspnea and "shakiness."  Troponin peaked at 0.71.  Echo on 10/08/2018 showed an EF of 55 to 60%, probable hypokinesis of the mid apical anterior septal myocardium, grade 1 diastolic dysfunction, mild aortic insufficiency, mildly dilated left atrium.  She underwent LHC on 10/08/2018 that showed a left main with minimal luminal irregularities, mid LAD-1 95% stenosed, mid LAD-2 60% stenosed, LCx mild diffuse disease throughout, RCA minimal luminal irregularities.  She underwent successful PCI/DES to the mid LAD.  She was noted to have normal LV systolic function and a mildly elevated LVEDP.  Labs: 09/2018 - LDL 53, potassium 4.2, serum creatinine 0.72, WBC 14, hemoglobin 11.7, A1c 6.6, magnesium 1.8, TSH normal  Cardiac medications: Aspirin 81 mg, Lipitor 40 mg, Plavix 75 mg, lisinopril 20 mg, Lopressor 12.5 mg twice daily, sublingual nitroglycerin 0.4 mg as needed.  She comes in accompanied by her daughter today.  She has done well since her hospital discharge.  No chest pain, shortness of breath, palpitations, dizziness, presyncope, or syncope.  No lower extremity swelling, abdominal  distention, PND, or early satiety.  She has a stable, longstanding 2 pillow orthopnea.  She does have some bruising from the right radial cath site that is slowly improving.  Otherwise, no issues from her cath site.  She has been compliant with all medications and has not missed any doses including dual antiplatelet therapy.  She is not checking her blood pressure at home.  No falls, BRBPR or melena.  She has not needed any sublingual nitroglycerin.  She does not have any issues or concerns at this time.  Past Medical History:  Diagnosis Date  . Aortic insufficiency    a. TTE 12/19: EF 55-60%, probable HK of the mid apical anterior septal myocardium, Gr1DD, mild AI, mildly dilated LA  . Asthma   . CAD (coronary artery disease)    a. NSTEMI 12/19; b. LHC 10/08/18: LM minimal luminal irregs, mLAD-1 95% s/p PCI/DES, mLAD-2 60%, LCx mild diffuse disease throughout, RCA minimal luminal irregs  . Diabetes mellitus (HCC)   . Hypercholesterolemia   . Hypertension   . Osteopenia   . Polymyalgia rheumatica syndrome (HCC)   . Reactive airway disease     Past Surgical History:  Procedure Laterality Date  . ABDOMINAL HYSTERECTOMY  1981   prolapse and bleeding, ovaries not removed  . BREAST EXCISIONAL BIOPSY Right   . CORONARY STENT INTERVENTION N/A 10/08/2018   Procedure: CORONARY STENT INTERVENTION;  Surgeon: Iran Ouch, MD;  Location: [facility] INVASIVE CV LAB;  Service: Cardiovascular;  Laterality: N/A;  . LEFT HEART CATH AND CORONARY ANGIOGRAPHY N/A 10/08/2018   Procedure: LEFT HEART CATH  AND CORONARY ANGIOGRAPHY;  Surgeon: Iran Ouch, MD;  Location: [facility] INVASIVE CV LAB;  Service: Cardiovascular;  Laterality: N/A;  . UMBILICAL HERNIA REPAIR  7/94    Current Meds  Medication Sig  . ADVAIR DISKUS 250-50 MCG/DOSE AEPB INHALE ONE PUFF BY MOUTH EVERY 12 HOURS. RINSE MOUTH AFTER EACH USE  . alendronate (FOSAMAX) 70 MG tablet 70 mg once a week.   Marland Kitchen aspirin 81 MG tablet Take 81 mg by mouth  daily.  Marland Kitchen atorvastatin (LIPITOR) 40 MG tablet Take 1 tablet (40 mg total) by mouth daily at 6 PM.  . calcium carbonate (OS-CAL) 600 MG TABS Take 600 mg by mouth daily.  . clopidogrel (PLAVIX) 75 MG tablet Take 1 tablet (75 mg total) by mouth daily with breakfast.  . cyanocobalamin (,VITAMIN B-12,) 1000 MCG/ML injection Inject into the muscle.  . fluticasone (FLONASE) 50 MCG/ACT nasal spray USE 2 SPRAY(S) IN EACH NOSTRIL ONCE DAILY  . lisinopril (PRINIVIL,ZESTRIL) 20 MG tablet Take 20 mg by mouth daily.  . metFORMIN (GLUCOPHAGE) 500 MG tablet TAKE 1 TABLET BY MOUTH TWICE DAILY WITH A MEAL  . metoprolol tartrate (LOPRESSOR) 25 MG tablet Take 0.5 tablets (12.5 mg total) by mouth 2 (two) times daily.  . Multiple Vitamins-Minerals (VISION FORMULA/LUTEIN PO) Take by mouth daily.  . nitroGLYCERIN (NITROSTAT) 0.4 MG SL tablet Place 1 tablet (0.4 mg total) under the tongue every 5 (five) minutes as needed for chest pain.  Marland Kitchen PROAIR HFA 108 (90 Base) MCG/ACT inhaler Inhale 2 puffs into the lungs 4 (four) times daily as needed.  . Vitamin D, Ergocalciferol, (DRISDOL) 50000 units CAPS capsule Take 50,000 Units by mouth once a week.    Allergies:   No known drug allergy and Tramadol   Social History:  The patient  reports that she has never smoked. She has never used smokeless tobacco. She reports that she does not drink alcohol or use drugs.   Family History:  The patient's family history includes Arthritis in her mother; Heart attack in her father; Heart disease in her mother; Throat cancer in her sister.  ROS:   Review of Systems  Constitutional: Positive for malaise/fatigue. Negative for chills, diaphoresis, fever and weight loss.  HENT: Negative for congestion.   Eyes: Negative for discharge and redness.  Respiratory: Negative for cough, hemoptysis, sputum production, shortness of breath and wheezing.   Cardiovascular: Negative for chest pain, palpitations, orthopnea, claudication, leg swelling and  PND.  Gastrointestinal: Negative for abdominal pain, blood in stool, heartburn, melena, nausea and vomiting.  Genitourinary: Negative for hematuria.  Musculoskeletal: Negative for falls and myalgias.  Skin: Negative for rash.  Neurological: Negative for dizziness, tingling, tremors, sensory change, speech change, focal weakness, loss of consciousness and weakness.  Endo/Heme/Allergies: Bruises/bleeds easily.  Psychiatric/Behavioral: Negative for substance abuse. The patient is not nervous/anxious.   All other systems reviewed and are negative.    PHYSICAL EXAM:  VS:  BP 128/64 (BP Location: Left Arm, Patient Position: Sitting, Cuff Size: Normal)   Pulse 73   Ht 5\' 1"  (1.549 m)   Wt 117 lb (53.1 kg)   BMI 22.11 kg/m  BMI: Body mass index is 22.11 kg/m.  Physical Exam  Constitutional: She is oriented to person, place, and time. She appears well-developed and well-nourished.  HENT:  Head: Normocephalic and atraumatic.  Eyes: Right eye exhibits no discharge. Left eye exhibits no discharge.  Neck: Normal range of motion. No JVD present.  Cardiovascular: Normal rate, regular rhythm, S1 normal and S2  normal. Exam reveals no distant heart sounds, no friction rub, no midsystolic click and no opening snap.  Murmur heard. High-pitched blowing decrescendo early diastolic murmur is present with a grade of 1/6 at the upper right sternal border radiating to the apex. Pulses:      Posterior tibial pulses are 2+ on the right side and 2+ on the left side.  -Right radial cath site has some resolving bruising without any active bleeding, swelling, erythema, warmth, or tenderness to palpation.  Radial pulse 2+. -Occasional extrasystole is noted to cardiac auscultation.  Pulmonary/Chest: Effort normal and breath sounds normal. No respiratory distress. She has no decreased breath sounds. She has no wheezes. She has no rales. She exhibits no tenderness.  Abdominal: Soft. She exhibits no distension. There  is no abdominal tenderness.  Musculoskeletal:        General: No edema.  Neurological: She is alert and oriented to person, place, and time.  Skin: Skin is warm and dry. No cyanosis. Nails show no clubbing.  Psychiatric: She has a normal mood and affect. Her speech is normal and behavior is normal. Judgment and thought content normal.     EKG:  Was ordered and interpreted by me today. Shows NSR, 73 bpm, PACs, nonspecific st/t changes   Recent Labs: 10/07/2018: ALT 12 10/08/2018: Magnesium 1.8; TSH 1.141 10/09/2018: BUN 25; Creatinine, Ser 0.72; Hemoglobin 11.7; Platelets 309; Potassium 4.2; Sodium 140  10/08/2018: Cholesterol 172; HDL 49; LDL Cholesterol 53; Total CHOL/HDL Ratio 3.5; Triglycerides 349; VLDL 70   Estimated Creatinine Clearance: 40.2 mL/min (by C-G formula based on SCr of 0.72 mg/dL).   Wt Readings from Last 3 Encounters:  10/18/18 117 lb (53.1 kg)  10/16/18 120 lb 3.2 oz (54.5 kg)  10/07/18 119 lb (54 kg)     Other studies reviewed: Additional studies/records reviewed today include: summarized above  ASSESSMENT AND PLAN:  1. CAD involving the native coronary arteries without angina: She is doing well without any symptoms concerning for angina.  Continue dual antiplatelet therapy with aspirin 81 mg and Plavix 75 mg daily without interruption for at least the next 12 months.  She does have residual 60% stenosis involving the mid LAD that will be managed medically.  Currently, she is asymptomatic from this.  Cardiac rehab.  Continue metoprolol and Lipitor as outlined below.  Post-cath instructions.  Aggressive secondary prevention.  No plans for further ischemic evaluation at this time.  2. PACs: Asymptomatic.  Check BMP and magnesium.  Recent thyroid function normal.  She is uncertain if she is taking Lopressor 12.5 mg twice daily or 25 mg twice daily.  The daughter will call us back later today to notify us of which dose she is taking.  At that time, would recommend  escalation of beta-blocker therapy given PACs noted on EKG and during exam.  3. Aortic valve insufficiency: Mild aortic regurgitation noted on echo earlier this month.  Aortic root noted to be normal in size at that time.  Asymptomatic.  Continue to monitor clinically.  4. Hyperlipidemia: LDL at goal during recent admission as above.  Continue atorvastatin 40 mg daily.  5. Hypertension: Blood pressure is well controlled today.  Remains on lisinopril and metoprolol.  Disposition: F/u with Dr. Kirke Corin or an APP in 3 months.  Current medicines are reviewed at length with the patient today.  The patient did not have any concerns regarding medicines.  Signed, Eula Listen, PA-C 10/18/2018 9:47 AM     [facility] [address] [place], [place] [address] [phone]

## 2018-10-16 NOTE — Progress Notes (Signed)
[name], female   DOB: 19-Aug-1933, 84 y.o.   MRN: [id number]   Subjective:    [name], female    DOB: 18-Dec-1932, 84 y.o.   MRN: [id number]  HPI  Patient here for hospital follow up.  She is accompanied by her daughter.  History obtained from both of them.  She was admitted 10/07/18 with chest pain.  S/p stent placement - 95% mid LAD lesion.  Post intervention - 0% residual stenosis.  LV systolic function normal.  Discharged on plavix.  Referred to cardiac rehab.  Has f/u planned with cardiology this week.  Overall feels better.  No further chest pain.  No sob.  No acid reflux.  No abdominal pain.  Bowels moving.  Overall feels she is doing well.     Past Medical History:  Diagnosis Date  . Aortic insufficiency    a. TTE 12/19: EF 55-60%, probable HK of the mid apical anterior septal myocardium, Gr1DD, mild AI, mildly dilated LA  . Asthma   . CAD (coronary artery disease)    a. NSTEMI 12/19; b. LHC 10/08/18: LM minimal luminal irregs, mLAD-1 95% s/p PCI/DES, mLAD-2 60%, LCx mild diffuse disease throughout, RCA minimal luminal irregs  . Diabetes mellitus ([place])   . Hypercholesterolemia   . Hypertension   . Osteopenia   . Polymyalgia rheumatica syndrome ([place])   . Reactive airway disease    Past Surgical History:  Procedure Laterality Date  . ABDOMINAL HYSTERECTOMY  1981   prolapse and bleeding, ovaries not removed  . BREAST EXCISIONAL BIOPSY Right   . CORONARY STENT INTERVENTION N/A 10/08/2018   Procedure: CORONARY STENT INTERVENTION;  Surgeon: Wellington Hampshire, MD;  Location: [place] CV LAB;  Service: Cardiovascular;  Laterality: N/A;  . LEFT HEART CATH AND CORONARY ANGIOGRAPHY N/A 10/08/2018   Procedure: LEFT HEART CATH AND CORONARY ANGIOGRAPHY;  Surgeon: Wellington Hampshire, MD;  Location: [place] CV LAB;  Service: Cardiovascular;  Laterality: N/A;  . UMBILICAL HERNIA REPAIR  7/94   Family History  Problem Relation Age of Onset    . Heart attack Father   . Arthritis Mother   . Heart disease Mother   . Throat cancer Sister    Social History   Socioeconomic History  . Marital status: Widowed    Spouse name: Not on file  . Number of children: 3  . Years of education: Not on file  . Highest education level: Not on file  Occupational History  . Not on file  Social Needs  . Financial resource strain: Not hard at all  . Food insecurity:    Worry: Never true    Inability: Never true  . Transportation needs:    Medical: No    Non-medical: No  Tobacco Use  . Smoking status: Never Smoker  . Smokeless tobacco: Never Used  Substance and Sexual Activity  . Alcohol use: No    Alcohol/week: 0.0 standard drinks  . Drug use: No  . Sexual activity: Never  Lifestyle  . Physical activity:    Days per week: Not on file    Minutes per session: Not on file  . Stress: Not on file  Relationships  . Social connections:    Talks on phone: Not on file    Gets together: Not on file    Attends religious service: Not on file    Active member of club or organization: Not on file    Attends meetings of clubs  or organizations: Not on file    Relationship status: Not on file  Other Topics Concern  . Not on file  Social History Narrative  . Not on file    Outpatient Encounter Medications as of 10/16/2018  Medication Sig  . ADVAIR DISKUS 250-50 MCG/DOSE AEPB INHALE ONE PUFF BY MOUTH EVERY 12 HOURS. RINSE MOUTH AFTER EACH USE  . alendronate (FOSAMAX) 70 MG tablet 70 mg once a week.   Marland Kitchen aspirin 81 MG tablet Take 81 mg by mouth daily.  . calcium carbonate (OS-CAL) 600 MG TABS Take 600 mg by mouth daily.  . cyanocobalamin (,VITAMIN B-12,) 1000 MCG/ML injection Inject into the muscle.  . fluticasone (FLONASE) 50 MCG/ACT nasal spray USE 2 SPRAY(S) IN EACH NOSTRIL ONCE DAILY  . metFORMIN (GLUCOPHAGE) 500 MG tablet TAKE 1 TABLET BY MOUTH TWICE DAILY WITH A MEAL  . Multiple Vitamins-Minerals (VISION FORMULA/LUTEIN PO) Take by  mouth daily.  . nitroGLYCERIN (NITROSTAT) 0.4 MG SL tablet Place 1 tablet (0.4 mg total) under the tongue every 5 (five) minutes as needed for chest pain.  Marland Kitchen PROAIR HFA 108 (90 Base) MCG/ACT inhaler Inhale 2 puffs into the lungs 4 (four) times daily as needed.  . Vitamin D, Ergocalciferol, (DRISDOL) 50000 units CAPS capsule Take 50,000 Units by mouth once a week.  . [DISCONTINUED] atorvastatin (LIPITOR) 40 MG tablet Take 1 tablet (40 mg total) by mouth daily at 6 PM.  . [DISCONTINUED] azithromycin (ZITHROMAX) 250 MG tablet Take 2 tablets daily for 3 days for treatment for sinusitis.  . [DISCONTINUED] clopidogrel (PLAVIX) 75 MG tablet Take 1 tablet (75 mg total) by mouth daily with breakfast.  . [DISCONTINUED] lisinopril (PRINIVIL,ZESTRIL) 20 MG tablet Take 20 mg by mouth daily.  . [DISCONTINUED] metoprolol tartrate (LOPRESSOR) 25 MG tablet Take 0.5 tablets (12.5 mg total) by mouth 2 (two) times daily.  . [DISCONTINUED] mupirocin ointment (BACTROBAN) 2 % Apply 1 application topically 2 (two) times daily.  . [EXPIRED] cyanocobalamin ((VITAMIN B-12)) injection 1,000 mcg    No facility-administered encounter medications on file as of 10/16/2018.     Review of Systems  Constitutional: Negative for appetite change and unexpected weight change.  HENT: Negative for congestion and sinus pressure.   Respiratory: Negative for cough, chest tightness and shortness of breath.   Cardiovascular: Negative for chest pain, palpitations and leg swelling.  Gastrointestinal: Negative for abdominal pain, diarrhea, nausea and vomiting.  Genitourinary: Negative for difficulty urinating and dysuria.  Musculoskeletal: Negative for joint swelling and myalgias.  Skin: Negative for color change and rash.  Neurological: Negative for dizziness, light-headedness and headaches.  Psychiatric/Behavioral: Negative for agitation and dysphoric mood.       Objective:    Physical Exam Constitutional:      General: She is  not in acute distress.    Appearance: Normal appearance.  HENT:     Nose: Nose normal. No congestion.     Mouth/Throat:     Pharynx: No oropharyngeal exudate or posterior oropharyngeal erythema.  Neck:     Musculoskeletal: Neck supple. No muscular tenderness.     Thyroid: No thyromegaly.  Cardiovascular:     Rate and Rhythm: Normal rate and regular rhythm.  Pulmonary:     Effort: No respiratory distress.     Breath sounds: Normal breath sounds. No wheezing.  Abdominal:     General: Bowel sounds are normal.     Palpations: Abdomen is soft.     Tenderness: There is no abdominal tenderness.  Musculoskeletal:  General: No swelling or tenderness.  Lymphadenopathy:     Cervical: No cervical adenopathy.  Skin:    Findings: No erythema or rash.  Neurological:     Mental Status: She is alert.  Psychiatric:        Mood and Affect: Mood normal.        Behavior: Behavior normal.     BP 100/60 (BP Location: Left Arm, Patient Position: Sitting, Cuff Size: Normal)   Pulse 89   Temp 99.5 F (37.5 C) (Oral)   Resp 16   Wt 120 lb 3.2 oz (54.5 kg)   SpO2 96%   BMI 22.71 kg/m  Wt Readings from Last 3 Encounters:  10/18/18 117 lb (53.1 kg)  10/16/18 120 lb 3.2 oz (54.5 kg)  10/07/18 119 lb (54 kg)     Lab Results  Component Value Date   WBC 14.0 (H) 10/09/2018   HGB 11.7 (L) 10/09/2018   HCT 36.7 10/09/2018   PLT 309 10/09/2018   GLUCOSE 107 (H) 10/18/2018   CHOL 172 10/08/2018   TRIG 349 (H) 10/08/2018   HDL 49 10/08/2018   LDLDIRECT 85.0 06/15/2017   LDLCALC 53 10/08/2018   ALT 12 10/07/2018   AST 31 10/07/2018   NA 137 10/18/2018   K 4.6 10/18/2018   CL 100 10/18/2018   CREATININE 0.75 10/18/2018   BUN 14 10/18/2018   CO2 19 (L) 10/18/2018   TSH 1.141 10/08/2018   HGBA1C 6.6 (H) 10/08/2018   MICROALBUR 2.3 (H) 06/15/2017       Assessment & Plan:   Problem List Items Addressed This Visit    Diabetes mellitus with cardiac complication ([place])    Low carb  diet and exercise.  Follow met b and a1c.        Hypercholesterolemia    On lipitor.  Low cholesterol diet and exercise.  Follow lipid panel and liver function tests.        Hypertension    Blood pressure under good control.  Continue same medication regimen.  Follow pressures.  Follow metabolic panel.        Non-ST elevation (NSTEMI) myocardial infarction [facility])    Recently admitted and diagnosed with Non-ST elevation MI.  Cardiac cath as outlined.  S/p successful angioplasty and drug-eluting stent placement to the mid LAD.  Doing well.  No chest pain.  On plavix.  Follow.         Other Visit Diagnoses    B12 deficiency    -  Primary   Relevant Medications   cyanocobalamin ((VITAMIN B-12)) injection 1,000 mcg (Completed)       Einar Pheasant, MD

## 2018-10-18 ENCOUNTER — Ambulatory Visit: Payer: Self-pay | Admitting: Physician Assistant

## 2018-10-18 ENCOUNTER — Encounter: Payer: Self-pay | Admitting: Physician Assistant

## 2018-10-18 ENCOUNTER — Ambulatory Visit (INDEPENDENT_AMBULATORY_CARE_PROVIDER_SITE_OTHER): Payer: Medicare Other | Admitting: Physician Assistant

## 2018-10-18 ENCOUNTER — Telehealth: Payer: Self-pay | Admitting: Physician Assistant

## 2018-10-18 VITALS — BP 128/64 | HR 73 | Ht 61.0 in | Wt 117.0 lb

## 2018-10-18 DIAGNOSIS — I2510 Atherosclerotic heart disease of native coronary artery without angina pectoris: Secondary | ICD-10-CM | POA: Diagnosis not present

## 2018-10-18 DIAGNOSIS — I351 Nonrheumatic aortic (valve) insufficiency: Secondary | ICD-10-CM

## 2018-10-18 DIAGNOSIS — E785 Hyperlipidemia, unspecified: Secondary | ICD-10-CM | POA: Diagnosis not present

## 2018-10-18 DIAGNOSIS — I10 Essential (primary) hypertension: Secondary | ICD-10-CM | POA: Diagnosis not present

## 2018-10-18 DIAGNOSIS — I491 Atrial premature depolarization: Secondary | ICD-10-CM | POA: Diagnosis not present

## 2018-10-18 MED ORDER — METOPROLOL TARTRATE 25 MG PO TABS: 12.5000 mg | ORAL_TABLET | Freq: Two times a day (BID) | ORAL | 3 refills | Status: DC

## 2018-10-18 MED ORDER — LISINOPRIL 20 MG PO TABS: 20.0000 mg | ORAL_TABLET | Freq: Every day | ORAL | 3 refills | Status: DC

## 2018-10-18 MED ORDER — CLOPIDOGREL BISULFATE 75 MG PO TABS: 75.0000 mg | ORAL_TABLET | Freq: Every day | ORAL | 3 refills | Status: DC

## 2018-10-18 MED ORDER — ATORVASTATIN CALCIUM 40 MG PO TABS: 40.0000 mg | ORAL_TABLET | Freq: Every day | ORAL | 3 refills | Status: DC

## 2018-10-18 MED ORDER — METOPROLOL TARTRATE 25 MG PO TABS: 25.0000 mg | ORAL_TABLET | Freq: Two times a day (BID) | ORAL | 3 refills | Status: DC

## 2018-10-18 NOTE — Patient Instructions (Signed)
Medication Instructions:  Your physician recommends that you continue on your current medications as directed. Please refer to the Current Medication list given to you today.  If you need a refill on your cardiac medications before your next appointment, please call your pharmacy.   Lab work: Your physician recommends that you return for lab work today (BMET, Regulatory affairs officer)   If you have labs (blood work) drawn today and your tests are completely normal, you will receive your results only by: Marland Kitchen MyChart Message (if you have MyChart) OR . A paper copy in the mail If you have any lab test that is abnormal or we need to change your treatment, we will call you to review the results.  Testing/Procedures: None ordered   Follow-Up: At [facility], you and your health needs are our priority.  As part of our continuing mission to provide you with exceptional heart care, we have created designated Provider Care Teams.  These Care Teams include your primary Cardiologist (physician) and Advanced Practice Providers (APPs -  Physician Assistants and Nurse Practitioners) who all work together to provide you with the care you need, when you need it. You will need a follow up appointment in 3 months.  You may see Lorine Bears, MD or one of the following Advanced Practice Providers on your designated Care Team:   Nicolasa Ducking, NP Eula Listen, PA-C . Marisue Ivan, PA-C

## 2018-10-18 NOTE — Telephone Encounter (Signed)
Call to patients daughter after confirming that dose that pt has been taking is 0.5 tablet (12.5 mg). Recommendation from Eula Listen, PA INCREASE dose to 1 tablet (25 mg) twice daily.    Patients daughter verbalized understanding. New script sent to pts pharm.    Advised pt to call for any further questions or concerns

## 2018-10-18 NOTE — Telephone Encounter (Signed)
Pt daughter is calling states pt is taking her Metoprolol 1/2 tab 25 mg twice a day. Please call to discuss

## 2018-10-19 LAB — BASIC METABOLIC PANEL
BUN / CREAT RATIO: 19 (ref 12–28)
BUN: 14 mg/dL (ref 8–27)
CO2: 19 mmol/L — ABNORMAL LOW (ref 20–29)
Calcium: 9.4 mg/dL (ref 8.7–10.3)
Chloride: 100 mmol/L (ref 96–106)
Creatinine, Ser: 0.75 mg/dL (ref 0.57–1.00)
GFR calc Af Amer: 85 mL/min/{1.73_m2} (ref >59)
GFR calc non Af Amer: 74 mL/min/{1.73_m2} (ref >59)
Glucose: 107 mg/dL — ABNORMAL HIGH (ref 65–99)
Potassium: 4.6 mmol/L (ref 3.5–5.2)
Sodium: 137 mmol/L (ref 134–144)

## 2018-10-19 LAB — MAGNESIUM: Magnesium: 1.9 mg/dL (ref 1.6–2.3)

## 2018-10-21 ENCOUNTER — Encounter: Payer: Self-pay | Admitting: Internal Medicine

## 2018-10-21 NOTE — Assessment & Plan Note (Signed)
On lipitor.  Low cholesterol diet and exercise.  Follow lipid panel and liver function tests.   

## 2018-10-21 NOTE — Assessment & Plan Note (Signed)
Blood pressure under good control.  Continue same medication regimen.  Follow pressures.  Follow metabolic panel.   

## 2018-10-21 NOTE — Assessment & Plan Note (Signed)
Low carb diet and exercise.  Follow met b and a1c.   

## 2018-10-21 NOTE — Assessment & Plan Note (Signed)
Recently admitted and diagnosed with Non-ST elevation MI.  Cardiac cath as outlined.  S/p successful angioplasty and drug-eluting stent placement to the mid LAD.  Doing well.  No chest pain.  On plavix.  Follow.

## 2018-11-08 ENCOUNTER — Ambulatory Visit (INDEPENDENT_AMBULATORY_CARE_PROVIDER_SITE_OTHER): Payer: Medicare Other | Admitting: Internal Medicine

## 2018-11-08 DIAGNOSIS — I10 Essential (primary) hypertension: Secondary | ICD-10-CM | POA: Diagnosis not present

## 2018-11-08 DIAGNOSIS — M810 Age-related osteoporosis without current pathological fracture: Secondary | ICD-10-CM

## 2018-11-08 DIAGNOSIS — E1159 Type 2 diabetes mellitus with other circulatory complications: Secondary | ICD-10-CM

## 2018-11-08 DIAGNOSIS — E7800 Pure hypercholesterolemia, unspecified: Secondary | ICD-10-CM | POA: Diagnosis not present

## 2018-11-08 DIAGNOSIS — J4520 Mild intermittent asthma, uncomplicated: Secondary | ICD-10-CM | POA: Diagnosis not present

## 2018-11-08 NOTE — Progress Notes (Signed)
[name], female   DOB: October 25, 1933, 85 y.o.   MRN: [id number]   Subjective:    [name], female    DOB: [birth year], 85 y.o.   MRN: [id number]  HPI  Patient here for a scheduled follow up.  She is accompanied by her daughter-n-law [name] [place]).  History obtained from both of them.  She feels she is doing well.  Trying to stay active.  No chest pain.  No sob.  No acid reflux. No abdominal pain.  Bowels moving.  Taking asprin and plavix.  States am sugars averaging in 120s.     Past Medical History:  Diagnosis Date  . Aortic insufficiency    a. TTE 12/19: EF 55-60%, probable HK of the mid apical anterior septal myocardium, Gr1DD, mild AI, mildly dilated LA  . Asthma   . CAD (coronary artery disease)    a. NSTEMI 12/19; b. LHC 10/08/18: LM minimal luminal irregs, mLAD-1 95% s/p PCI/DES, mLAD-2 60%, LCx mild diffuse disease throughout, RCA minimal luminal irregs  . Diabetes mellitus ([place])   . Hypercholesterolemia   . Hypertension   . Osteopenia   . Polymyalgia rheumatica syndrome ([place])   . Reactive airway disease    Past Surgical History:  Procedure Laterality Date  . ABDOMINAL HYSTERECTOMY  1981   prolapse and bleeding, ovaries not removed  . BREAST EXCISIONAL BIOPSY Right   . CORONARY STENT INTERVENTION N/A 10/08/2018   Procedure: CORONARY STENT INTERVENTION;  Surgeon: Wellington Hampshire, MD;  Location: [facility];  Service: Cardiovascular;  Laterality: N/A;  . LEFT HEART CATH AND CORONARY ANGIOGRAPHY N/A 10/08/2018   Procedure: LEFT HEART CATH AND CORONARY ANGIOGRAPHY;  Surgeon: Wellington Hampshire, MD;  Location: [facility];  Service: Cardiovascular;  Laterality: N/A;  . UMBILICAL HERNIA REPAIR  7/94   Family History  Problem Relation Age of Onset  . Heart attack Father   . Arthritis Mother   . Heart disease Mother   . Throat cancer Sister    Social History   Socioeconomic History  . Marital status: Widowed   Spouse name: Not on file  . Number of children: 3  . Years of education: Not on file  . Highest education level: Not on file  Occupational History  . Not on file  Social Needs  . Financial resource strain: Not hard at all  . Food insecurity:    Worry: Never true    Inability: Never true  . Transportation needs:    Medical: No    Non-medical: No  Tobacco Use  . Smoking status: Never Smoker  . Smokeless tobacco: Never Used  Substance and Sexual Activity  . Alcohol use: No    Alcohol/week: 0.0 standard drinks  . Drug use: No  . Sexual activity: Never  Lifestyle  . Physical activity:    Days per week: Not on file    Minutes per session: Not on file  . Stress: Not on file  Relationships  . Social connections:    Talks on phone: Not on file    Gets together: Not on file    Attends religious service: Not on file    Active member of club or organization: Not on file    Attends meetings of clubs or organizations: Not on file    Relationship status: Not on file  Other Topics Concern  . Not on file  Social History Narrative  . Not on file    Outpatient  Encounter Medications as of 11/08/2018  Medication Sig  . ADVAIR DISKUS 250-50 MCG/DOSE AEPB INHALE ONE PUFF BY MOUTH EVERY 12 HOURS. RINSE MOUTH AFTER EACH USE  . alendronate (FOSAMAX) 70 MG tablet 70 mg once a week.   Marland Kitchen aspirin 81 MG tablet Take 81 mg by mouth daily.  Marland Kitchen atorvastatin (LIPITOR) 40 MG tablet Take 1 tablet (40 mg total) by mouth daily at 6 PM.  . calcium carbonate (OS-CAL) 600 MG TABS Take 600 mg by mouth daily.  . clopidogrel (PLAVIX) 75 MG tablet Take 1 tablet (75 mg total) by mouth daily with breakfast.  . cyanocobalamin (,VITAMIN B-12,) 1000 MCG/ML injection Inject into the muscle.  . fluticasone (FLONASE) 50 MCG/ACT nasal spray USE 2 SPRAY(S) IN EACH NOSTRIL ONCE DAILY  . lisinopril (PRINIVIL,ZESTRIL) 20 MG tablet Take 1 tablet (20 mg total) by mouth daily.  . metFORMIN (GLUCOPHAGE) 500 MG tablet TAKE 1 TABLET  BY MOUTH TWICE DAILY WITH A MEAL  . metoprolol tartrate (LOPRESSOR) 25 MG tablet Take 1 tablet (25 mg total) by mouth 2 (two) times daily.  . Multiple Vitamins-Minerals (VISION FORMULA/LUTEIN PO) Take by mouth daily.  . nitroGLYCERIN (NITROSTAT) 0.4 MG SL tablet Place 1 tablet (0.4 mg total) under the tongue every 5 (five) minutes as needed for chest pain.  Marland Kitchen PROAIR HFA 108 (90 Base) MCG/ACT inhaler Inhale 2 puffs into the lungs 4 (four) times daily as needed.  . Vitamin D, Ergocalciferol, (DRISDOL) 50000 units CAPS capsule Take 50,000 Units by mouth once a week.   No facility-administered encounter medications on file as of 11/08/2018.     Review of Systems  Constitutional: Negative for appetite change and unexpected weight change.  HENT: Negative for congestion and sinus pressure.   Respiratory: Negative for cough, chest tightness and shortness of breath.   Cardiovascular: Negative for chest pain, palpitations and leg swelling.  Gastrointestinal: Negative for abdominal pain, diarrhea, nausea and vomiting.  Genitourinary: Negative for difficulty urinating and dysuria.  Musculoskeletal: Negative for joint swelling and myalgias.  Skin: Negative for color change and rash.  Neurological: Negative for dizziness, light-headedness and headaches.  Psychiatric/Behavioral: Negative for agitation and dysphoric mood.       Objective:    Physical Exam Constitutional:      General: She is not in acute distress.    Appearance: Normal appearance.  HENT:     Nose: Nose normal. No congestion.     Mouth/Throat:     Pharynx: No oropharyngeal exudate or posterior oropharyngeal erythema.  Neck:     Musculoskeletal: Neck supple. No muscular tenderness.     Thyroid: No thyromegaly.  Cardiovascular:     Rate and Rhythm: Normal rate and regular rhythm.  Pulmonary:     Effort: No respiratory distress.     Breath sounds: Normal breath sounds. No wheezing.  Abdominal:     General: Bowel sounds are  normal.     Palpations: Abdomen is soft.     Tenderness: There is no abdominal tenderness.  Musculoskeletal:        General: No swelling or tenderness.  Lymphadenopathy:     Cervical: No cervical adenopathy.  Skin:    Findings: No erythema or rash.  Neurological:     Mental Status: She is alert.  Psychiatric:        Mood and Affect: Mood normal.        Behavior: Behavior normal.     BP 132/76   Pulse 85   Temp 98.7 F (37.1 C) (  Oral)   Resp 18   Wt 123 lb (55.8 kg)   SpO2 97%   BMI 23.24 kg/m  Wt Readings from Last 3 Encounters:  11/08/18 123 lb (55.8 kg)  10/18/18 117 lb (53.1 kg)  10/16/18 120 lb 3.2 oz (54.5 kg)     Lab Results  Component Value Date   WBC 14.0 (H) 10/09/2018   HGB 11.7 (L) 10/09/2018   HCT 36.7 10/09/2018   PLT 309 10/09/2018   GLUCOSE 107 (H) 10/18/2018   CHOL 172 10/08/2018   TRIG 349 (H) 10/08/2018   HDL 49 10/08/2018   LDLDIRECT 85.0 06/15/2017   LDLCALC 53 10/08/2018   ALT 12 10/07/2018   AST 31 10/07/2018   NA 137 10/18/2018   K 4.6 10/18/2018   CL 100 10/18/2018   CREATININE 0.75 10/18/2018   BUN 14 10/18/2018   CO2 19 (L) 10/18/2018   TSH 1.141 10/08/2018   HGBA1C 6.6 (H) 10/08/2018   MICROALBUR 2.3 (H) 06/15/2017       Assessment & Plan:   Problem List Items Addressed This Visit    Diabetes mellitus with cardiac complication ([place])    Low carb diet and exercise.  Sugars as outlined.  Follow met b and a1c.        Relevant Orders   Hemoglobin P9E   Basic metabolic panel   Microalbumin / creatinine urine ratio   Hypercholesterolemia    On lipitor.  Low cholesterol diet and exercise.  Follow lipid panel and liver function tests.        Relevant Orders   Hepatic function panel   Lipid panel   Hypertension    Blood pressure on recheck improved.  Same medication.  Follow pressures.  Follow metabolic panel.        Relevant Orders   CBC with Differential/Platelet   Osteoporosis    On fosamax,        Reactive  airway disease    Breathing stable.            Einar Pheasant, MD

## 2018-11-11 ENCOUNTER — Encounter: Payer: Self-pay | Admitting: Internal Medicine

## 2018-11-11 NOTE — Assessment & Plan Note (Signed)
On lipitor.  Low cholesterol diet and exercise.  Follow lipid panel and liver function tests.   

## 2018-11-11 NOTE — Assessment & Plan Note (Signed)
Breathing stable.

## 2018-11-11 NOTE — Assessment & Plan Note (Signed)
Low carb diet and exercise.  Sugars as outlined.  Follow met b and a1c.   

## 2018-11-11 NOTE — Assessment & Plan Note (Signed)
On fosamax,

## 2018-11-11 NOTE — Assessment & Plan Note (Signed)
Blood pressure on recheck improved.  Same medication.  Follow pressures.  Follow metabolic panel.  

## 2018-11-15 ENCOUNTER — Ambulatory Visit (INDEPENDENT_AMBULATORY_CARE_PROVIDER_SITE_OTHER): Payer: Medicare Other

## 2018-11-15 DIAGNOSIS — E538 Deficiency of other specified B group vitamins: Secondary | ICD-10-CM | POA: Diagnosis not present

## 2018-11-15 MED ORDER — CYANOCOBALAMIN 1000 MCG/ML IJ SOLN
1000.0000 ug | Freq: Once | INTRAMUSCULAR | Status: AC
  Administered 2018-11-15: 1000 ug via INTRAMUSCULAR

## 2018-11-15 NOTE — Progress Notes (Addendum)
Pt came in today for b12 inj. Given in right deltoid. Pt tolerated well with no complaints and concerns.   Reviewed.  Dr Lorin Picket

## 2018-11-27 ENCOUNTER — Telehealth: Payer: Self-pay | Admitting: Cardiovascular Disease

## 2018-11-27 ENCOUNTER — Other Ambulatory Visit: Payer: Self-pay

## 2018-11-27 MED ORDER — METOPROLOL TARTRATE 25 MG PO TABS: 25.0000 mg | ORAL_TABLET | Freq: Two times a day (BID) | ORAL | 3 refills | Status: DC

## 2018-11-27 NOTE — Telephone Encounter (Signed)
Requested Prescriptions   Signed Prescriptions Disp Refills  . metoprolol tartrate (LOPRESSOR) 25 MG tablet 90 tablet 3    Sig: Take 1 tablet (25 mg total) by mouth 2 (two) times daily.    Authorizing Provider: Sondra Barges    Ordering User: Margrett Rud

## 2018-11-27 NOTE — Telephone Encounter (Signed)
Spoke with Patients daughter (OK per DPR) Made her aware that Per Eula Listen, PA in the last phone note 10/18/2018 that he wanted them to INCREASE Metoprolol to ONE TABLET TWICE A DAY.   Send in a refill for the patient with those instructions.   metoprolol tartrate (LOPRESSOR) 25 MG tablet 90 tablet 3 11/27/2018    Sig - Route: Take 1 tablet (25 mg total) by mouth 2 (two) times daily. - Oral   Sent to pharmacy as: metoprolol tartrate (LOPRESSOR) 25 MG tablet   E-Prescribing Status: Receipt confirmed by pharmacy (11/27/2018 10:27 AM EST)   Pharmacy   [id number] - [place]), [place] - [address]

## 2018-11-27 NOTE — Telephone Encounter (Signed)
°*  STAT* If patient is at the pharmacy, call can be transferred to refill team.   1. Which medications need to be refilled? (please list name of each medication and dose if known) metoprolol tartrate (LOPRESSOR) 25 MG - states they cannot remember how they are suppose to take, R. Dunn changed at last appt.  2. Which pharmacy/location (including street and city if local pharmacy) is medication to be sent to? [facility] on [address]  3. Do they need a 30 day or 90 day supply? 90 day  States that they never received new prescription and pharmacy does not have.  Please resubmit.

## 2018-12-14 ENCOUNTER — Encounter: Payer: Self-pay | Admitting: Medical Oncology

## 2018-12-14 ENCOUNTER — Other Ambulatory Visit: Payer: Self-pay

## 2018-12-14 ENCOUNTER — Emergency Department: Payer: Medicare Other

## 2018-12-14 ENCOUNTER — Observation Stay
Admission: EM | Admit: 2018-12-14 | Discharge: 2018-12-15 | Disposition: A | Discharge status: Home / Self Care | Payer: Medicare Other | Attending: Internal Medicine | Admitting: Internal Medicine

## 2018-12-14 DIAGNOSIS — I252 Old myocardial infarction: Secondary | ICD-10-CM | POA: Insufficient documentation

## 2018-12-14 DIAGNOSIS — Z955 Presence of coronary angioplasty implant and graft: Secondary | ICD-10-CM | POA: Insufficient documentation

## 2018-12-14 DIAGNOSIS — Z79899 Other long term (current) drug therapy: Secondary | ICD-10-CM | POA: Insufficient documentation

## 2018-12-14 DIAGNOSIS — E785 Hyperlipidemia, unspecified: Secondary | ICD-10-CM | POA: Diagnosis not present

## 2018-12-14 DIAGNOSIS — I351 Nonrheumatic aortic (valve) insufficiency: Secondary | ICD-10-CM | POA: Insufficient documentation

## 2018-12-14 DIAGNOSIS — I10 Essential (primary) hypertension: Secondary | ICD-10-CM | POA: Diagnosis not present

## 2018-12-14 DIAGNOSIS — M353 Polymyalgia rheumatica: Secondary | ICD-10-CM | POA: Insufficient documentation

## 2018-12-14 DIAGNOSIS — M8580 Other specified disorders of bone density and structure, unspecified site: Secondary | ICD-10-CM | POA: Diagnosis not present

## 2018-12-14 DIAGNOSIS — I491 Atrial premature depolarization: Secondary | ICD-10-CM | POA: Insufficient documentation

## 2018-12-14 DIAGNOSIS — Z7902 Long term (current) use of antithrombotics/antiplatelets: Secondary | ICD-10-CM | POA: Diagnosis not present

## 2018-12-14 DIAGNOSIS — J45909 Unspecified asthma, uncomplicated: Secondary | ICD-10-CM | POA: Diagnosis not present

## 2018-12-14 DIAGNOSIS — Z7982 Long term (current) use of aspirin: Secondary | ICD-10-CM | POA: Diagnosis not present

## 2018-12-14 DIAGNOSIS — Z8249 Family history of ischemic heart disease and other diseases of the circulatory system: Secondary | ICD-10-CM | POA: Insufficient documentation

## 2018-12-14 DIAGNOSIS — R079 Chest pain, unspecified: Secondary | ICD-10-CM | POA: Diagnosis not present

## 2018-12-14 DIAGNOSIS — Z66 Do not resuscitate: Secondary | ICD-10-CM | POA: Diagnosis not present

## 2018-12-14 DIAGNOSIS — I700 Atherosclerosis of aorta: Secondary | ICD-10-CM | POA: Insufficient documentation

## 2018-12-14 DIAGNOSIS — Z7952 Long term (current) use of systemic steroids: Secondary | ICD-10-CM | POA: Diagnosis not present

## 2018-12-14 DIAGNOSIS — E7800 Pure hypercholesterolemia, unspecified: Secondary | ICD-10-CM | POA: Diagnosis not present

## 2018-12-14 DIAGNOSIS — Z7984 Long term (current) use of oral hypoglycemic drugs: Secondary | ICD-10-CM | POA: Insufficient documentation

## 2018-12-14 DIAGNOSIS — R0789 Other chest pain: Secondary | ICD-10-CM | POA: Diagnosis present

## 2018-12-14 DIAGNOSIS — I2510 Atherosclerotic heart disease of native coronary artery without angina pectoris: Secondary | ICD-10-CM | POA: Diagnosis not present

## 2018-12-14 DIAGNOSIS — E119 Type 2 diabetes mellitus without complications: Secondary | ICD-10-CM | POA: Diagnosis not present

## 2018-12-14 DIAGNOSIS — Z7951 Long term (current) use of inhaled steroids: Secondary | ICD-10-CM | POA: Diagnosis not present

## 2018-12-14 DIAGNOSIS — J9811 Atelectasis: Secondary | ICD-10-CM | POA: Diagnosis not present

## 2018-12-14 LAB — TROPONIN I
Troponin I: <0.03 ng/mL (ref <0.03)
Troponin I: <0.03 ng/mL (ref <0.03)

## 2018-12-14 LAB — CBC
HCT: 42.9 % (ref 36.0–46.0)
Hemoglobin: 13.8 g/dL (ref 12.0–15.0)
MCH: 28.4 pg (ref 26.0–34.0)
MCHC: 32.2 g/dL (ref 30.0–36.0)
MCV: 88.3 fL (ref 80.0–100.0)
Platelets: 284 10*3/uL (ref 150–400)
RBC: 4.86 MIL/uL (ref 3.87–5.11)
RDW: 13.3 % (ref 11.5–15.5)
WBC: 9.3 10*3/uL (ref 4.0–10.5)
nRBC: 0 % (ref 0.0–0.2)

## 2018-12-14 LAB — BASIC METABOLIC PANEL
Anion gap: 10 (ref 5–15)
BUN: 15 mg/dL (ref 8–23)
CO2: 24 mmol/L (ref 22–32)
Calcium: 9.4 mg/dL (ref 8.9–10.3)
Chloride: 105 mmol/L (ref 98–111)
Creatinine, Ser: 0.77 mg/dL (ref 0.44–1.00)
GFR calc Af Amer: >60 mL/min (ref >60)
GFR calc non Af Amer: >60 mL/min (ref >60)
GLUCOSE: 201 mg/dL — AB (ref 70–99)
Potassium: 4.1 mmol/L (ref 3.5–5.1)
Sodium: 139 mmol/L (ref 135–145)

## 2018-12-14 LAB — HEMOGLOBIN A1C
Hgb A1c MFr Bld: 6.4 % — ABNORMAL HIGH (ref 4.8–5.6)
Mean Plasma Glucose: 136.98 mg/dL

## 2018-12-14 MED ORDER — MOMETASONE FURO-FORMOTEROL FUM 200-5 MCG/ACT IN AERO
2.0000 | INHALATION_SPRAY | Freq: Two times a day (BID) | RESPIRATORY_TRACT | Status: DC
  Administered 2018-12-14 – 2018-12-15 (×2): 2 via RESPIRATORY_TRACT
  Filled 2018-12-14 (×2): qty 8.8

## 2018-12-14 MED ORDER — LISINOPRIL 20 MG PO TABS
20.0000 mg | ORAL_TABLET | Freq: Every day | ORAL | Status: DC
  Administered 2018-12-15: 20 mg via ORAL
  Filled 2018-12-14: qty 1

## 2018-12-14 MED ORDER — ALBUTEROL SULFATE (2.5 MG/3ML) 0.083% IN NEBU: 2.5000 mg | INHALATION_SOLUTION | RESPIRATORY_TRACT | Status: DC | PRN

## 2018-12-14 MED ORDER — CLOPIDOGREL BISULFATE 75 MG PO TABS
75.0000 mg | ORAL_TABLET | Freq: Every day | ORAL | Status: DC
  Administered 2018-12-15: 75 mg via ORAL
  Filled 2018-12-14: qty 1

## 2018-12-14 MED ORDER — SODIUM CHLORIDE 0.9% FLUSH
3.0000 mL | Freq: Two times a day (BID) | INTRAVENOUS | Status: DC
  Administered 2018-12-14 – 2018-12-15 (×3): 3 mL via INTRAVENOUS

## 2018-12-14 MED ORDER — ASPIRIN EC 81 MG PO TBEC
81.0000 mg | DELAYED_RELEASE_TABLET | Freq: Every day | ORAL | Status: DC
  Administered 2018-12-15: 81 mg via ORAL
  Filled 2018-12-14: qty 1

## 2018-12-14 MED ORDER — PANTOPRAZOLE SODIUM 40 MG PO TBEC
40.0000 mg | DELAYED_RELEASE_TABLET | Freq: Every day | ORAL | Status: DC
  Administered 2018-12-14 – 2018-12-15 (×2): 40 mg via ORAL
  Filled 2018-12-14 (×2): qty 1

## 2018-12-14 MED ORDER — NITROGLYCERIN 0.4 MG SL SUBL
0.4000 mg | SUBLINGUAL_TABLET | SUBLINGUAL | Status: DC | PRN
  Administered 2018-12-14: 0.4 mg via SUBLINGUAL

## 2018-12-14 MED ORDER — ATORVASTATIN CALCIUM 20 MG PO TABS: 40.0000 mg | ORAL_TABLET | Freq: Every day | ORAL | Status: DC

## 2018-12-14 MED ORDER — ACETAMINOPHEN 325 MG PO TABS
650.0000 mg | ORAL_TABLET | Freq: Four times a day (QID) | ORAL | Status: DC | PRN
  Administered 2018-12-14: 650 mg via ORAL
  Filled 2018-12-14: qty 2

## 2018-12-14 MED ORDER — POLYETHYLENE GLYCOL 3350 17 G PO PACK: 17.0000 g | PACK | Freq: Every day | ORAL | Status: DC | PRN

## 2018-12-14 MED ORDER — NITROGLYCERIN 0.4 MG SL SUBL
SUBLINGUAL_TABLET | SUBLINGUAL | Status: AC
  Administered 2018-12-14: 0.4 mg via SUBLINGUAL
  Filled 2018-12-14: qty 1

## 2018-12-14 MED ORDER — NITROGLYCERIN 0.4 MG SL SUBL: 0.4000 mg | SUBLINGUAL_TABLET | SUBLINGUAL | Status: DC | PRN

## 2018-12-14 MED ORDER — ONDANSETRON HCL 4 MG PO TABS: 4.0000 mg | ORAL_TABLET | Freq: Four times a day (QID) | ORAL | Status: DC | PRN

## 2018-12-14 MED ORDER — ENOXAPARIN SODIUM 40 MG/0.4ML ~~LOC~~ SOLN
40.0000 mg | SUBCUTANEOUS | Status: DC
  Administered 2018-12-14: 40 mg via SUBCUTANEOUS
  Filled 2018-12-14: qty 0.4

## 2018-12-14 MED ORDER — FLUTICASONE PROPIONATE 50 MCG/ACT NA SUSP
1.0000 | Freq: Every day | NASAL | Status: DC | PRN
  Filled 2018-12-14: qty 16

## 2018-12-14 MED ORDER — ACETAMINOPHEN 650 MG RE SUPP: 650.0000 mg | Freq: Four times a day (QID) | RECTAL | Status: DC | PRN

## 2018-12-14 MED ORDER — METOPROLOL TARTRATE 25 MG PO TABS
25.0000 mg | ORAL_TABLET | Freq: Two times a day (BID) | ORAL | Status: DC
  Administered 2018-12-14 – 2018-12-15 (×2): 25 mg via ORAL
  Filled 2018-12-14 (×2): qty 1

## 2018-12-14 MED ORDER — ONDANSETRON HCL 4 MG/2ML IJ SOLN: 4.0000 mg | Freq: Four times a day (QID) | INTRAMUSCULAR | Status: DC | PRN

## 2018-12-14 NOTE — ED Provider Notes (Signed)
[facility] Emergency Department Provider Note ____________________________________________   First MD Initiated Contact with Patient 12/14/18 [phone]     (approximate)  I have reviewed the triage vital signs and the nursing notes.   HISTORY  Chief Complaint Chest Pain    HPI Tanya Cole is a 85 y.o. female with PMH as noted below including CAD with recent PCI and stenting in December of last year who presents with chest discomfort, described as tightness, acute onset around 730 while the patient was eating, and now improving.  The patient states that she felt slightly short of breath.  She denies nausea or lightheadedness.  The patient states it feels somewhat like it did when she had an MI a few months ago.  Past Medical History:  Diagnosis Date  . Aortic insufficiency    a. TTE 12/19: EF 55-60%, probable HK of the mid apical anterior septal myocardium, Gr1DD, mild AI, mildly dilated LA  . Asthma   . CAD (coronary artery disease)    a. NSTEMI 12/19; b. LHC 10/08/18: LM minimal luminal irregs, mLAD-1 95% s/p PCI/DES, mLAD-2 60%, LCx mild diffuse disease throughout, RCA minimal luminal irregs  . Diabetes mellitus ([facility])   . Hypercholesterolemia   . Hypertension   . Osteopenia   . Polymyalgia rheumatica syndrome ([facility])   . Reactive airway disease     Patient Active Problem List   Diagnosis Date Noted  . Non-ST elevation (NSTEMI) myocardial infarction ([facility])   . Chest pain 10/07/2018  . Memory change 05/27/2018  . Osteoporosis 02/05/2018  . Weight loss 06/24/2017  . Abdominal pain, left lower quadrant 10/05/2016  . Long term current use of systemic steroids 05/16/2016  . Elevated erythrocyte sedimentation rate 05/04/2016  . Back pain 04/29/2016  . Fatigue 05/24/2015  . Health care maintenance 01/25/2015  . UTI (urinary tract infection) 07/07/2014  . Neuropathy 03/24/2014  . Diverticulitis 02/24/2013  . Reactive airway disease 09/15/2012  .  Hypertension 09/14/2012  . Hypercholesterolemia 09/14/2012  . Diabetes mellitus with cardiac complication ([facility]) 09/14/2012    Past Surgical History:  Procedure Laterality Date  . ABDOMINAL HYSTERECTOMY  1981   prolapse and bleeding, ovaries not removed  . BREAST EXCISIONAL BIOPSY Right   . CORONARY STENT INTERVENTION N/A 10/08/2018   Procedure: CORONARY STENT INTERVENTION;  Surgeon: Iran Ouch, MD;  Location: [facility];  Service: Cardiovascular;  Laterality: N/A;  . LEFT HEART CATH AND CORONARY ANGIOGRAPHY N/A 10/08/2018   Procedure: LEFT HEART CATH AND CORONARY ANGIOGRAPHY;  Surgeon: Iran Ouch, MD;  Location: [facility];  Service: Cardiovascular;  Laterality: N/A;  . UMBILICAL HERNIA REPAIR  7/94    Prior to Admission medications   Medication Sig Start Date End Date Taking? Authorizing Provider  alendronate (FOSAMAX) 70 MG tablet 70 mg once a week.  11/06/17  Yes [provider]  aspirin 81 MG tablet Take 81 mg by mouth daily.   Yes [provider]  atorvastatin (LIPITOR) 40 MG tablet Take 1 tablet (40 mg total) by mouth daily at 6 PM. 10/18/18  Yes Dunn, Raymon Mutton, PA-C  clopidogrel (PLAVIX) 75 MG tablet Take 1 tablet (75 mg total) by mouth daily with breakfast. 10/18/18  Yes Dunn, Raymon Mutton, PA-C  cyanocobalamin (,VITAMIN B-12,) 1000 MCG/ML injection Inject into the muscle.   Yes [provider]  lisinopril (PRINIVIL,ZESTRIL) 20 MG tablet Take 1 tablet (20 mg total) by mouth daily. 10/18/18  Yes Sondra Barges, PA-C  metFORMIN (GLUCOPHAGE) 500 MG tablet TAKE 1 TABLET BY MOUTH TWICE DAILY WITH A MEAL 10/01/18  Yes Dale DurhamScott, Charlene, MD  metoprolol tartrate (LOPRESSOR) 25 MG tablet Take 1 tablet (25 mg total) by mouth 2 (two) times daily. 11/27/18  Yes Dunn, Raymon Muttonyan M, PA-C  Multiple Vitamins-Minerals (VISION FORMULA/LUTEIN PO) Take by mouth daily.   Yes [provider]  Vitamin D, Ergocalciferol, (DRISDOL) 50000 units CAPS capsule  Take 50,000 Units by mouth once a week. 06/24/18  Yes [provider]  ADVAIR DISKUS 250-50 MCG/DOSE AEPB INHALE ONE PUFF BY MOUTH EVERY 12 HOURS. RINSE MOUTH AFTER EACH USE 02/17/16   Dale DurhamScott, Charlene, MD  fluticasone (FLONASE) 50 MCG/ACT nasal spray USE 2 SPRAY(S) IN EACH NOSTRIL ONCE DAILY Patient taking differently: Place 1 spray into both nostrils daily.  08/27/18   Dale DurhamScott, Charlene, MD  nitroGLYCERIN (NITROSTAT) 0.4 MG SL tablet Place 1 tablet (0.4 mg total) under the tongue every 5 (five) minutes as needed for chest pain. 10/09/18   Adrian SaranMody, Sital, MD  PROAIR HFA 108 (90 Base) MCG/ACT inhaler Inhale 2 puffs into the lungs 4 (four) times daily as needed. 10/05/18   [provider]    Allergies Tramadol  Family History  Problem Relation Age of Onset  . Heart attack Father   . Arthritis Mother   . Heart disease Mother   . Throat cancer Sister     Social History Social History   Tobacco Use  . Smoking status: Never Smoker  . Smokeless tobacco: Never Used  Substance Use Topics  . Alcohol use: No    Alcohol/week: 0.0 standard drinks  . Drug use: No    Review of Systems  Constitutional: No fever. Eyes: No redness. ENT: No neck pain. Cardiovascular: Positive for chest discomfort. Respiratory: Positive for shortness of breath. Gastrointestinal: No vomiting or diarrhea.  Genitourinary: Negative for flank pain.  Musculoskeletal: Negative for back pain. Skin: Negative for rash. Neurological: Negative for headache.   ____________________________________________   PHYSICAL EXAM:  VITAL SIGNS: ED Triage Vitals  Enc Vitals Group     BP 12/14/18 0853 (!) 148/65     Pulse Rate 12/14/18 0853 88     Resp 12/14/18 0853 16     Temp 12/14/18 0853 98.4 F (36.9 C)     Temp Source 12/14/18 0853 Oral     SpO2 12/14/18 0853 96 %     Weight 12/14/18 0854 127 lb (57.6 kg)     Height 12/14/18 0854 5\' 1"  (1.549 m)     Head Circumference --      Peak Flow --      Pain  Score 12/14/18 0854 8     Pain Loc --      Pain Edu? --      Excl. in GC? --     Constitutional: Alert and oriented.  Relatively well appearing and in no acute distress. Eyes: Conjunctivae are normal.  Head: Atraumatic. Nose: No congestion/rhinnorhea. Mouth/Throat: Mucous membranes are moist.   Neck: Normal range of motion.  Cardiovascular: Normal rate, regular rhythm. Grossly normal heart sounds.  Good peripheral circulation. Respiratory: Normal respiratory effort.  No retractions. Lungs CTAB. Gastrointestinal: No distention.  Musculoskeletal: No lower extremity edema.  Extremities warm and well perfused.  Neurologic:  Normal speech and language. No gross focal neurologic deficits are appreciated.  Skin:  Skin is warm and dry. No rash noted. Psychiatric: Mood and affect are normal. Speech and behavior are normal.  ____________________________________________   LABS (all labs ordered  are listed, but only abnormal results are displayed)  Labs Reviewed  BASIC METABOLIC PANEL - Abnormal; Notable for the following components:      Result Value   Glucose, Bld 201 (*)    All other components within normal limits  CBC  TROPONIN I   ____________________________________________  EKG  ED ECG REPORT I, Dionne Bucy, the attending physician, personally viewed and interpreted this ECG.  Date: 12/14/2018 EKG Time: 0852 Rate: 89 Rhythm: normal sinus rhythm with PVCs QRS Axis: normal Intervals: normal ST/T Wave abnormalities: Nonspecific lateral ST abnormalities Narrative Interpretation: no evidence of acute ischemia; no significant change when compared to EKG of 10/18/2018  ____________________________________________  RADIOLOGY  CXR: Left basilar atelectasis  ____________________________________________   PROCEDURES  Procedure(s) performed: No  Procedures  Critical Care performed: No ____________________________________________   INITIAL IMPRESSION /  ASSESSMENT AND PLAN / ED COURSE  Pertinent labs & imaging results that were available during my care of the patient were reviewed by me and considered in my medical decision making (see chart for details).  85 year old female with PMH as noted above presents with acute onset of chest tightness with some shortness of breath this morning which now is improving.  I reviewed the past medical records in Epic; the patient was admitted in early December with elevated troponin and nonspecific EKG changes.  She had a catheterization with stent placed in the LAD.  On exam the patient is overall well-appearing and her vital signs are normal.  The remainder of the exam is unremarkable.  EKG shows no acute changes.  Although the patient is clinically stable and the EKG is reassuring, she is at elevated risk for ACS.  We will obtain lab work-up including troponin.  I will consult cardiology to determine whether we should admit the patient to observe or potentially rule out with repeat troponin in the ED and discharged home with close follow-up.  ----------------------------------------- 11:21 AM on 12/14/2018 -----------------------------------------  Initial troponin is negative.  The patient states that her chest pain has improved but is still present.  I consulted Dr. Mariah Milling from cardiology.  He advised that the patient could either stay for a second troponin and if negative proceed with outpatient stress test, versus admission for Myoview and serial troponins while in the hospital.  I discussed this with the patient and her daughter.  They both would feel more comfortable with the patient staying in the hospital overnight.  I signed the patient out to the hospitalist Dr. Elpidio Anis.  ____________________________________________   FINAL CLINICAL IMPRESSION(S) / ED DIAGNOSES  Final diagnoses:  Chest pain, unspecified type      NEW MEDICATIONS STARTED DURING THIS VISIT:  New Prescriptions   No  medications on file     Note:  This document was prepared using Dragon voice recognition software and may include unintentional dictation errors.    Dionne Bucy, MD 12/14/18 1122

## 2018-12-14 NOTE — ED Notes (Signed)
Report called. Pt transported to rm 245 on monitor by edt

## 2018-12-14 NOTE — Care Management Obs Status (Signed)
MEDICARE OBSERVATION STATUS NOTIFICATION   Patient Details  Name: Tanya Cole MRN: [id number] Date of Birth: 1933/01/04   Medicare Observation Status Notification Given:  Yes    Allayne Butcher, RN 12/14/2018, 2:07 PM

## 2018-12-14 NOTE — ED Notes (Signed)
Attempted report. RN in isolation room, call back

## 2018-12-14 NOTE — Progress Notes (Signed)
Advance care planning  Purpose of Encounter Chest pain  Parties in Attendance Patient and Daughter at bedside  Patients Decisional capacity Alert and oriented   Discussed regarding chest pain, treatment plan and prognosis.  All questions answered CODE STATUS discussed.  Patient wishes to be DNR/DNI.   Orders entered and CODE STATUS changed  DNR/DNI  Time spent - 17 minutes

## 2018-12-14 NOTE — ED Triage Notes (Signed)
Pt reports beginning to have chest tightness/pressure this am around 0730. Denies sob. Pt reports hx of stent.

## 2018-12-14 NOTE — ED Notes (Signed)
Report to Bill, RN

## 2018-12-14 NOTE — Care Management Note (Signed)
Case Management Note  Patient Details  Name: Tanya Cole MRN: [id number] Date of Birth: 1933/05/20  Subjective/Objective:  Patient is being placed under observation for chest pain.  MOON letter reviewed, signed and copy given.  Patient is awake and alert, independent in all ADL's.  Patient does not drive but her daughter will carry her any where she needs to go and the daughter does the grocery shopping.  Daughter at the bedside.  Patient does have a walker and cane at home but does not need to use them.  No discharge needs anticipated at this time. Robbie Lis RN BSN [phone]                   Action/Plan:   Expected Discharge Date:                  Expected Discharge Plan:  Home/Self Care  In-House Referral:     Discharge planning Services     Post Acute Care Choice:    Choice offered to:     DME Arranged:    DME Agency:     HH Arranged:    HH Agency:     Status of Service:  Completed, signed off  If discussed at Long Length of Stay Meetings, dates discussed:    Additional Comments:  Allayne Butcher, RN 12/14/2018, 2:09 PM

## 2018-12-14 NOTE — H&P (Signed)
[facility] - [place] at [facility]   PATIENT NAME: Tanya Cole    MR#:  [id number]  DATE OF BIRTH:  1933/06/28  DATE OF ADMISSION:  12/14/2018  PRIMARY CARE PHYSICIAN: Dale [place], MD   REQUESTING/REFERRING PHYSICIAN: Dr. Marisa Severin  CHIEF COMPLAINT:   Chief Complaint  Patient presents with  . Chest Pain    HISTORY OF PRESENT ILLNESS:  Tanya Cole  is a 85 y.o. female with a known history of hypertension, diabetes, CAD with non-ST elevation MI in December 2019 and PCI with drug-eluting stent to mid LAD presents to the emergency room with acute onset of chest pressure while eating breakfast.  Patient turned extremely pale and fatigued with shortness of breath.  This slowly improved by the time she arrived to the emergency room.  Her pressure was rated at 10/10 initially and presently at 5/10.  Did not take sublingual nitro at home and has not been given in the emergency room.  She had chest pain during her episode of non-ST elevation MI in today she says this is different at this pressure.  No nausea or vomiting.  No prior symptoms of GERD.  Afebrile.  Has been taking all her medications.  Is on dual antiplatelet therapy with aspirin and Plavix.  Does not smoke.  Does not drink alcohol.  Referral made to cardiac rehab at discharge but patient has been not following due to lack of transport.  PAST MEDICAL HISTORY:   Past Medical History:  Diagnosis Date  . Aortic insufficiency    a. TTE 12/19: EF 55-60%, probable HK of the mid apical anterior septal myocardium, Gr1DD, mild AI, mildly dilated LA  . Asthma   . CAD (coronary artery disease)    a. NSTEMI 12/19; b. LHC 10/08/18: LM minimal luminal irregs, mLAD-1 95% s/p PCI/DES, mLAD-2 60%, LCx mild diffuse disease throughout, RCA minimal luminal irregs  . Diabetes mellitus (HCC)   . Hypercholesterolemia   . Hypertension   . Osteopenia   . Polymyalgia rheumatica syndrome (HCC)   . Reactive airway disease      PAST SURGICAL HISTORY:   Past Surgical History:  Procedure Laterality Date  . ABDOMINAL HYSTERECTOMY  1981   prolapse and bleeding, ovaries not removed  . BREAST EXCISIONAL BIOPSY Right   . CORONARY STENT INTERVENTION N/A 10/08/2018   Procedure: CORONARY STENT INTERVENTION;  Surgeon: Iran Ouch, MD;  Location: [facility] INVASIVE CV LAB;  Service: Cardiovascular;  Laterality: N/A;  . LEFT HEART CATH AND CORONARY ANGIOGRAPHY N/A 10/08/2018   Procedure: LEFT HEART CATH AND CORONARY ANGIOGRAPHY;  Surgeon: Iran Ouch, MD;  Location: [facility] INVASIVE CV LAB;  Service: Cardiovascular;  Laterality: N/A;  . UMBILICAL HERNIA REPAIR  7/94    SOCIAL HISTORY:   Social History   Tobacco Use  . Smoking status: Never Smoker  . Smokeless tobacco: Never Used  Substance Use Topics  . Alcohol use: No    Alcohol/week: 0.0 standard drinks    FAMILY HISTORY:   Family History  Problem Relation Age of Onset  . Heart attack Father   . Arthritis Mother   . Heart disease Mother   . Throat cancer Sister     DRUG ALLERGIES:   Allergies  Allergen Reactions  . Tramadol Itching    Nausea    REVIEW OF SYSTEMS:   Review of Systems  Constitutional: Positive for malaise/fatigue. Negative for chills and fever.  HENT: Negative for sore throat.   Eyes: Negative for blurred vision, double vision  and pain.  Respiratory: Positive for shortness of breath. Negative for cough, hemoptysis and wheezing.   Cardiovascular: Positive for chest pain. Negative for palpitations, orthopnea and leg swelling.  Gastrointestinal: Negative for abdominal pain, constipation, diarrhea, heartburn, nausea and vomiting.  Genitourinary: Negative for dysuria and hematuria.  Musculoskeletal: Negative for back pain and joint pain.  Skin: Negative for rash.  Neurological: Negative for sensory change, speech change, focal weakness and headaches.  Endo/Heme/Allergies: Does not bruise/bleed easily.  Psychiatric/Behavioral:  Negative for depression. The patient is not nervous/anxious.     MEDICATIONS AT HOME:   Prior to Admission medications   Medication Sig Start Date End Date Taking? Authorizing Provider  alendronate (FOSAMAX) 70 MG tablet 70 mg once a week.  11/06/17  Yes [provider]  aspirin 81 MG tablet Take 81 mg by mouth daily.   Yes [provider]  atorvastatin (LIPITOR) 40 MG tablet Take 1 tablet (40 mg total) by mouth daily at 6 PM. 10/18/18  Yes Dunn, Raymon Muttonyan M, PA-C  clopidogrel (PLAVIX) 75 MG tablet Take 1 tablet (75 mg total) by mouth daily with breakfast. 10/18/18  Yes Dunn, Raymon Muttonyan M, PA-C  cyanocobalamin (,VITAMIN B-12,) 1000 MCG/ML injection Inject into the muscle.   Yes [provider]  lisinopril (PRINIVIL,ZESTRIL) 20 MG tablet Take 1 tablet (20 mg total) by mouth daily. 10/18/18  Yes Dunn, Ryan M, PA-C  metFORMIN (GLUCOPHAGE) 500 MG tablet TAKE 1 TABLET BY MOUTH TWICE DAILY WITH A MEAL 10/01/18  Yes Dale DurhamScott, Charlene, MD  metoprolol tartrate (LOPRESSOR) 25 MG tablet Take 1 tablet (25 mg total) by mouth 2 (two) times daily. 11/27/18  Yes Dunn, Raymon Muttonyan M, PA-C  Multiple Vitamins-Minerals (VISION FORMULA/LUTEIN PO) Take by mouth daily.   Yes [provider]  Vitamin D, Ergocalciferol, (DRISDOL) 50000 units CAPS capsule Take 50,000 Units by mouth once a week. 06/24/18  Yes [provider]  ADVAIR DISKUS 250-50 MCG/DOSE AEPB INHALE ONE PUFF BY MOUTH EVERY 12 HOURS. RINSE MOUTH AFTER EACH USE 02/17/16   Dale DurhamScott, Charlene, MD  fluticasone (FLONASE) 50 MCG/ACT nasal spray USE 2 SPRAY(S) IN EACH NOSTRIL ONCE DAILY Patient taking differently: Place 1 spray into both nostrils daily.  08/27/18   Dale DurhamScott, Charlene, MD  nitroGLYCERIN (NITROSTAT) 0.4 MG SL tablet Place 1 tablet (0.4 mg total) under the tongue every 5 (five) minutes as needed for chest pain. 10/09/18   Adrian SaranMody, Sital, MD  PROAIR HFA 108 (90 Base) MCG/ACT inhaler Inhale 2 puffs into the lungs 4 (four) times daily as  needed. 10/05/18   [provider]     VITAL SIGNS:  Blood pressure (!) 148/65, pulse 88, temperature 98.4 F (36.9 C), temperature source Oral, resp. rate 16, height 5\' 1"  (1.549 m), weight 57.6 kg, SpO2 96 %.  PHYSICAL EXAMINATION:  Physical Exam  GENERAL:  85 y.o.-year-old patient lying in the bed with no acute distress.  EYES: Pupils equal, round, reactive to light and accommodation. No scleral icterus. Extraocular muscles intact.  HEENT: Head atraumatic, normocephalic. Oropharynx and nasopharynx clear. No oropharyngeal erythema, moist oral mucosa  NECK:  Supple, no jugular venous distention. No thyroid enlargement, no tenderness.  LUNGS: Normal breath sounds bilaterally, no wheezing, rales, rhonchi. No use of accessory muscles of respiration.  CARDIOVASCULAR: S1, S2 normal. No murmurs, rubs, or gallops.  ABDOMEN: Soft, nontender, nondistended. Bowel sounds present. No organomegaly or mass.  EXTREMITIES: No pedal edema, cyanosis, or clubbing. + 2 pedal & radial pulses b/l.   NEUROLOGIC: Cranial nerves II through  XII are intact. No focal Motor or sensory deficits appreciated b/l PSYCHIATRIC: The patient is alert and oriented x 3. Good affect.  SKIN: No obvious rash, lesion, or ulcer.   LABORATORY PANEL:   CBC Recent Labs  Lab 12/14/18 0908  WBC 9.3  HGB 13.8  HCT 42.9  PLT 284   ------------------------------------------------------------------------------------------------------------------  Chemistries  Recent Labs  Lab 12/14/18 0908  NA 139  K 4.1  CL 105  CO2 24  GLUCOSE 201*  BUN 15  CREATININE 0.77  CALCIUM 9.4   ------------------------------------------------------------------------------------------------------------------  Cardiac Enzymes Recent Labs  Lab 12/14/18 0908  TROPONINI <0.03   ------------------------------------------------------------------------------------------------------------------  RADIOLOGY:  Dg Chest 2  View  Result Date: 12/14/2018 CLINICAL DATA:  Chest pain. EXAM: CHEST - 2 VIEW COMPARISON:  Radiograph of October 07, 2018. FINDINGS: The heart size and mediastinal contours are within normal limits. No pneumothorax or pleural effusion is noted. Atherosclerosis of thoracic aorta is noted. Right lung is clear. Mild left basilar subsegmental atelectasis is noted. The visualized skeletal structures are unremarkable. IMPRESSION: Mild left basilar subsegmental atelectasis. Aortic Atherosclerosis (ICD10-I70.0). Electronically Signed   By: Lupita RaiderJames  Green Jr, M.D.   On: 12/14/2018 09:51     IMPRESSION AND PLAN:   *Chest pressure with recent PCI drug-eluting stent to LAD 3 weeks back.  Initial troponin and EKG are normal and reassuring.  But concerning symptoms.  Will give sublingual nitro to see if pain improves.  Will consult her cardiology team which is  medical group.  Informed from emergency room.  Repeat troponin.  Telemetry monitoring with observation admission.  Further management depending on progression of pressure and trend of troponin continue aspirin, Plavix, statin. Will also start patient on PPIs to rule out GERD if no improvement with nitro.  *Hypertension.  Continue home medications  *Diabetes mellitus.  Hold metformin.  Sliding scale insulin ordered.  Diabetic diet  *DVT prophylaxis with Lovenox  All the records are reviewed and case discussed with ED provider. Management plans discussed with the patient, family and they are in agreement.  CODE STATUS: DO NOT RESUSCITATE and DO NOT INTUBATE  TOTAL TIME TAKING CARE OF THIS PATIENT: 35 minutes.   Orie FishermanSrikar R Sharai Overbay M.D on 12/14/2018 at 11:26 AM  Between 7am to 6pm - Pager - [phone]  After 6pm go to [email or web address] - password [email or web address] [facility]  [facility]  Office  [phone]  CC: Primary care physician; Dale DurhamScott, Charlene, MD  Note: This dictation was prepared with Dragon dictation along with smaller phrase  technology. Any transcriptional errors that result from this process are unintentional.

## 2018-12-15 DIAGNOSIS — R079 Chest pain, unspecified: Secondary | ICD-10-CM

## 2018-12-15 DIAGNOSIS — I25118 Atherosclerotic heart disease of native coronary artery with other forms of angina pectoris: Secondary | ICD-10-CM

## 2018-12-15 DIAGNOSIS — I10 Essential (primary) hypertension: Secondary | ICD-10-CM | POA: Diagnosis not present

## 2018-12-15 DIAGNOSIS — E782 Mixed hyperlipidemia: Secondary | ICD-10-CM

## 2018-12-15 DIAGNOSIS — I2510 Atherosclerotic heart disease of native coronary artery without angina pectoris: Secondary | ICD-10-CM | POA: Diagnosis not present

## 2018-12-15 MED ORDER — ISOSORBIDE MONONITRATE ER 30 MG PO TB24: 30.0000 mg | ORAL_TABLET | Freq: Every day | ORAL | 0 refills | Status: DC

## 2018-12-15 MED ORDER — LISINOPRIL 10 MG PO TABS: 10.0000 mg | ORAL_TABLET | Freq: Every day | ORAL | Status: DC

## 2018-12-15 MED ORDER — ISOSORBIDE MONONITRATE ER 30 MG PO TB24
15.0000 mg | ORAL_TABLET | Freq: Once | ORAL | Status: AC
  Administered 2018-12-15: 15 mg via ORAL
  Filled 2018-12-15: qty 1

## 2018-12-15 MED ORDER — ISOSORBIDE MONONITRATE ER 30 MG PO TB24: 30.0000 mg | ORAL_TABLET | Freq: Every day | ORAL | Status: DC

## 2018-12-15 MED ORDER — LISINOPRIL 10 MG PO TABS: 10.0000 mg | ORAL_TABLET | Freq: Every day | ORAL | 0 refills | Status: DC

## 2018-12-15 NOTE — Progress Notes (Signed)
Tanya Cole ambulated the nurses station and surrounding hallways twice @09 :30. Tolerated well, HR maintained in low to mid 90's throughout the duration of her walk as evidenced by telemetry monitoring. Pt. Did not complain of any chest pain, discomfort, or SOB.

## 2018-12-15 NOTE — Plan of Care (Signed)
  Problem: Clinical Measurements: Goal: Will remain free from infection Outcome: Progressing Goal: Diagnostic test results will improve Outcome: Progressing Goal: Respiratory complications will improve Outcome: Progressing   Problem: Activity: Goal: Ability to tolerate increased activity will improve Outcome: Progressing   Problem: Cardiac: Goal: Ability to achieve and maintain adequate cardiovascular perfusion will improve Outcome: Progressing   Problem: Health Behavior/Discharge Planning: Goal: Ability to safely manage health-related needs after discharge will improve Outcome: Progressing

## 2018-12-15 NOTE — Consult Note (Addendum)
Cardiology Consultation:   Patient ID: Jayonna Koen MRN: [id number]; DOB: 07/13/1933  Admit date: 12/14/2018 Date of Consult: 12/15/2018  Primary Care Provider: Dale [place], MD Primary Cardiologist: Lorine Bears, MD [facility] Reason for consult: Chest pain Physician requesting consult: Dr. Elpidio Anis   Patient Profile:   Corona Sanderson is a 85 y.o. female with a hx of diabetes type 2, polymyalgia rheumatica syndrome, reactive airway disease/asthma,  coronary artery disease, stent placed to the proximal LAD 3 weeks ago for 90% lesion, still with residual more distal LAD 60% stenosis,She has a stable, longstanding 2 pillow orthopnea,  presenting with chest pain  History of Present Illness:   Ms. Skeel reports that she was sitting having oatmeal yesterday morning, after several bites developed some diffuse chest discomfort.  Described as a wave coming over her chest extending up. "  Hard to describe" .  Slight shortness of breath . described as 10/10 and then seemed to settle down to 5/10 Still had some discomfort in the emergency room, Does not sound as if nitro was given Symptoms resolved without intervention  She does not feel that her oatmeal got stuck in her throat, denies significant GI issues Does not have significant GERD symptoms  At baseline she is sedentary, does not go outside the house very much. She does not drive, daughter does all the grocery shopping  EKG on my review showing normal sinus rhythm no significant ST or T wave changes, no significant change compared to prior EKGs  Cardiac enzymes negative x3  Despite negative  Enzymes x2  in the emergency room daughter and patient prefered to stay for further observation and she was admitted overnight  Prior hospital records reviewed from December 2019 at which time she had non-STEMI, elevated troponin   Past Medical History:  Diagnosis Date  . Aortic insufficiency    a. TTE 12/19: EF 55-60%, probable HK of  the mid apical anterior septal myocardium, Gr1DD, mild AI, mildly dilated LA  . Asthma   . CAD (coronary artery disease)    a. NSTEMI 12/19; b. LHC 10/08/18: LM minimal luminal irregs, mLAD-1 95% s/p PCI/DES, mLAD-2 60%, LCx mild diffuse disease throughout, RCA minimal luminal irregs  . Diabetes mellitus (HCC)   . Hypercholesterolemia   . Hypertension   . Osteopenia   . Polymyalgia rheumatica syndrome (HCC)   . Reactive airway disease     Past Surgical History:  Procedure Laterality Date  . ABDOMINAL HYSTERECTOMY  1981   prolapse and bleeding, ovaries not removed  . BREAST EXCISIONAL BIOPSY Right   . CORONARY STENT INTERVENTION N/A 10/08/2018   Procedure: CORONARY STENT INTERVENTION;  Surgeon: Iran Ouch, MD;  Location: [facility] INVASIVE CV LAB;  Service: Cardiovascular;  Laterality: N/A;  . LEFT HEART CATH AND CORONARY ANGIOGRAPHY N/A 10/08/2018   Procedure: LEFT HEART CATH AND CORONARY ANGIOGRAPHY;  Surgeon: Iran Ouch, MD;  Location: [facility] INVASIVE CV LAB;  Service: Cardiovascular;  Laterality: N/A;  . UMBILICAL HERNIA REPAIR  7/94     Home Medications:  Prior to Admission medications   Medication Sig Start Date End Date Taking? Authorizing Provider  alendronate (FOSAMAX) 70 MG tablet 70 mg once a week.  11/06/17  Yes [provider]  aspirin 81 MG tablet Take 81 mg by mouth daily.   Yes [provider]  atorvastatin (LIPITOR) 40 MG tablet Take 1 tablet (40 mg total) by mouth daily at 6 PM. 10/18/18  Yes Dunn, Raymon Mutton, PA-C  clopidogrel (PLAVIX) 75  MG tablet Take 1 tablet (75 mg total) by mouth daily with breakfast. 10/18/18  Yes Dunn, Raymon Muttonyan M, PA-C  cyanocobalamin (,VITAMIN B-12,) 1000 MCG/ML injection Inject into the muscle.   Yes [provider]  lisinopril (PRINIVIL,ZESTRIL) 20 MG tablet Take 1 tablet (20 mg total) by mouth daily. 10/18/18  Yes Dunn, Ryan M, PA-C  metFORMIN (GLUCOPHAGE) 500 MG tablet TAKE 1 TABLET BY MOUTH TWICE DAILY WITH A MEAL  10/01/18  Yes Dale DurhamScott, Charlene, MD  metoprolol tartrate (LOPRESSOR) 25 MG tablet Take 1 tablet (25 mg total) by mouth 2 (two) times daily. 11/27/18  Yes Dunn, Raymon Muttonyan M, PA-C  Multiple Vitamins-Minerals (VISION FORMULA/LUTEIN PO) Take by mouth daily.   Yes [provider]  Vitamin D, Ergocalciferol, (DRISDOL) 50000 units CAPS capsule Take 50,000 Units by mouth once a week. 06/24/18  Yes [provider]  ADVAIR DISKUS 250-50 MCG/DOSE AEPB INHALE ONE PUFF BY MOUTH EVERY 12 HOURS. RINSE MOUTH AFTER EACH USE 02/17/16   Dale DurhamScott, Charlene, MD  fluticasone (FLONASE) 50 MCG/ACT nasal spray USE 2 SPRAY(S) IN EACH NOSTRIL ONCE DAILY Patient taking differently: Place 1 spray into both nostrils daily.  08/27/18   Dale DurhamScott, Charlene, MD  nitroGLYCERIN (NITROSTAT) 0.4 MG SL tablet Place 1 tablet (0.4 mg total) under the tongue every 5 (five) minutes as needed for chest pain. 10/09/18   Adrian SaranMody, Sital, MD  PROAIR HFA 108 (90 Base) MCG/ACT inhaler Inhale 2 puffs into the lungs 4 (four) times daily as needed. 10/05/18   [provider]    Inpatient Medications: Scheduled Meds: . aspirin EC  81 mg Oral Daily  . atorvastatin  40 mg Oral q1800  . clopidogrel  75 mg Oral Q breakfast  . enoxaparin (LOVENOX) injection  40 mg Subcutaneous Q24H  . lisinopril  20 mg Oral Daily  . metoprolol tartrate  25 mg Oral BID  . mometasone-formoterol  2 puff Inhalation BID  . pantoprazole  40 mg Oral Daily  . sodium chloride flush  3 mL Intravenous Q12H   Continuous Infusions:  PRN Meds: acetaminophen **OR** acetaminophen, albuterol, fluticasone, nitroGLYCERIN, ondansetron **OR** ondansetron (ZOFRAN) IV, polyethylene glycol  Allergies:    Allergies  Allergen Reactions  . Tramadol Itching    Nausea    Social History:   Social History   Socioeconomic History  . Marital status: Widowed    Spouse name: Not on file  . Number of children: 3  . Years of education: Not on file  . Highest education level: Not  on file  Occupational History  . Not on file  Social Needs  . Financial resource strain: Not hard at all  . Food insecurity:    Worry: Never true    Inability: Never true  . Transportation needs:    Medical: No    Non-medical: No  Tobacco Use  . Smoking status: Never Smoker  . Smokeless tobacco: Never Used  Substance and Sexual Activity  . Alcohol use: No    Alcohol/week: 0.0 standard drinks  . Drug use: No  . Sexual activity: Never  Lifestyle  . Physical activity:    Days per week: Not on file    Minutes per session: Not on file  . Stress: Not on file  Relationships  . Social connections:    Talks on phone: Not on file    Gets together: Not on file    Attends religious service: Not on file    Active member of club or organization: Not on file  Attends meetings of clubs or organizations: Not on file    Relationship status: Not on file  . Intimate partner violence:    Fear of current or ex partner: Not on file    Emotionally abused: Not on file    Physically abused: Not on file    Forced sexual activity: Not on file  Other Topics Concern  . Not on file  Social History Narrative  . Not on file    Family History:    Family History  Problem Relation Age of Onset  . Heart attack Father   . Arthritis Mother   . Heart disease Mother   . Throat cancer Sister      ROS:  Please see the history of present illness.  Review of Systems  Constitutional: Negative.   Respiratory: Negative.   Cardiovascular: Positive for chest pain.  Gastrointestinal: Negative.   Musculoskeletal: Negative.   Neurological: Negative.   Psychiatric/Behavioral: Negative.   All other systems reviewed and are negative.     Physical Exam/Data:   Vitals:   12/14/18 1844 12/14/18 2042 12/15/18 0453 12/15/18 0755  BP: (!) 162/70 (!) 142/74 (!) 114/48 123/60  Pulse: 84 80 69 67  Resp:  18 20 17   Temp: 99 F (37.2 C) 98.6 F (37 C) 98.5 F (36.9 C) 98 F (36.7 C)  TempSrc: Oral Oral  Oral Oral  SpO2: 96% 95% 96% 96%  Weight: 54.3 kg  53 kg   Height: 5\' 1"  (1.549 m)       Intake/Output Summary (Last 24 hours) at 12/15/2018 1018 Last data filed at 12/15/2018 [id number] Gross per 24 hour  Intake 240 ml  Output 950 ml  Net -710 ml   Last 3 Weights 12/15/2018 12/14/2018 12/14/2018  Weight (lbs) 116 lb 13.5 oz 119 lb 9.6 oz 127 lb  Weight (kg) 53 kg 54.25 kg 57.607 kg     Body mass index is 22.08 kg/m.  General:  Well nourished, well developed, in no acute distress HEENT: normal Lymph: no adenopathy Neck: no JVD Endocrine:  No thryomegaly Vascular: No carotid bruits; FA pulses 2+ bilaterally without bruits  Cardiac:  normal S1, S2; RRR; no murmur  Lungs:  clear to auscultation bilaterally, no wheezing, rhonchi or rales  Abd: soft, nontender, no hepatomegaly  Ext: no edema Musculoskeletal:  No deformities, BUE and BLE strength normal and equal Skin: warm and dry  Neuro:  CNs 2-12 intact, no focal abnormalities noted Psych:  Normal affect   EKG:  The EKG was personally reviewed and demonstrates: As detailed above, normal sinus rhythm no significant ST or T wave changes, rare PVCs Telemetry:  Telemetry was personally reviewed and demonstrates: Normal sinus rhythm rare PVC  Relevant CV Studies: Echo on 10/08/2018 showed an EF of 55 to 60%, probable hypokinesis of the mid apical anterior septal myocardium, grade 1 diastolic dysfunction, mild aortic insufficiency, mildly dilated left atrium.    LHC on 10/08/2018 that showed a left main with minimal luminal irregularities, mid LAD-1 95% stenosed, mid LAD-2 60% stenosed, LCx mild diffuse disease throughout, RCA minimal luminal irregularities.  She underwent successful PCI/DES to the mid LAD.  She was noted to have normal LV systolic function and a mildly elevated LVEDP.    Laboratory Data:  Chemistry Recent Labs  Lab 12/14/18 0908  NA 139  K 4.1  CL 105  CO2 24  GLUCOSE 201*  BUN 15  CREATININE 0.77  CALCIUM 9.4    GFRNONAA >60  GFRAA >60  ANIONGAP 10    No results for input(s): PROT, ALBUMIN, AST, ALT, ALKPHOS, BILITOT in the last 168 hours. Hematology Recent Labs  Lab 12/14/18 0908  WBC 9.3  RBC 4.86  HGB 13.8  HCT 42.9  MCV 88.3  MCH 28.4  MCHC 32.2  RDW 13.3  PLT 284   Cardiac Enzymes Recent Labs  Lab 12/14/18 0908 12/14/18 1338 12/14/18 1852  TROPONINI <0.03 <0.03 <0.03   No results for input(s): TROPIPOC in the last 168 hours.  BNPNo results for input(s): BNP, PROBNP in the last 168 hours.  DDimer No results for input(s): DDIMER in the last 168 hours.  Radiology/Studies:  Dg Chest 2 View  Result Date: 12/14/2018 CLINICAL DATA:  Chest pain. EXAM: CHEST - 2 VIEW COMPARISON:  Radiograph of October 07, 2018. FINDINGS: The heart size and mediastinal contours are within normal limits. No pneumothorax or pleural effusion is noted. Atherosclerosis of thoracic aorta is noted. Right lung is clear. Mild left basilar subsegmental atelectasis is noted. The visualized skeletal structures are unremarkable. IMPRESSION: Mild left basilar subsegmental atelectasis. Aortic Atherosclerosis (ICD10-I70.0). Electronically Signed   By: Lupita Raider, M.D.   On: 12/14/2018 09:51    Assessment and Plan:   Chest pain Etiology unclear, somewhat atypical in presentation, presenting at rest after eating some oatmeal.  To exclude GI etiology -Cardiac enzymes negative x3, EKG unchanged and she is ambulating around the unit without any significant reproducible symptoms --She already ate breakfast today, unable to do any testing She does have residual 60% more distal LAD disease, unable to exclude some stable anginal symptoms --Recommend we add Imdur 30 mg daily, will need to decrease lisinopril down to 10 mg daily == Also discussed taking nitroglycerin at home for further chest pain.  She did not take any on this admission --Continue other outpatient medications including aspirin Plavix beta-blocker ---  Need daughter to help with medications.  Patient unaware of what she is taking --- Long discussion with her recommended she use a pillbox for compliance  CAD with stable angina As above aspirin, statin, beta-blocker, Plavix Follow-up with Dr. Kirke Corin  Reactive airway disease Continue current inhalers  Hyperlipidemia Continue Lipitor 40 daily  Hypertension Would recommend we decrease lisinopril down to 10 mg daily Start isosorbide mononitrate 30 mg daily for possible spasm, stable angina   Case discussed with emergency room physician, differential discussed, plan discussed Discussed with nursing and patient in detail Discussed with hospitalist service  Total encounter time more than 110 minutes  Greater than 50% was spent in counseling and coordination of care with the patient  For questions or updates, please contact [facility] Please consult [email or web address] for contact info under     Signed, Julien Nordmann, MD  12/15/2018 10:18 AM

## 2018-12-15 NOTE — Progress Notes (Signed)
Tanya Cole to be D/C'd Home per MD order.  Discussed prescriptions and follow up appointments with the patient. Prescriptions  Electronically submitted, medication list explained in detail. Pt and daughter verbalized understanding.  Allergies as of 12/15/2018      Reactions   Tramadol Itching   Nausea      Medication List    TAKE these medications   ADVAIR DISKUS 250-50 MCG/DOSE Aepb Generic drug:  Fluticasone-Salmeterol INHALE ONE PUFF BY MOUTH EVERY 12 HOURS. RINSE MOUTH AFTER EACH USE   alendronate 70 MG tablet Commonly known as:  FOSAMAX 70 mg once a week.   aspirin 81 MG tablet Take 81 mg by mouth daily.   atorvastatin 40 MG tablet Commonly known as:  LIPITOR Take 1 tablet (40 mg total) by mouth daily at 6 PM.   clopidogrel 75 MG tablet Commonly known as:  PLAVIX Take 1 tablet (75 mg total) by mouth daily with breakfast.   cyanocobalamin 1000 MCG/ML injection Commonly known as:  (VITAMIN B-12) Inject into the muscle.   fluticasone 50 MCG/ACT nasal spray Commonly known as:  FLONASE USE 2 SPRAY(S) IN EACH NOSTRIL ONCE DAILY What changed:  See the new instructions.   isosorbide mononitrate 30 MG 24 hr tablet Commonly known as:  IMDUR Take 1 tablet (30 mg total) by mouth daily. Start taking on:  December 16, 2018   lisinopril 10 MG tablet Commonly known as:  PRINIVIL,ZESTRIL Take 1 tablet (10 mg total) by mouth daily. What changed:    medication strength  how much to take   metFORMIN 500 MG tablet Commonly known as:  GLUCOPHAGE TAKE 1 TABLET BY MOUTH TWICE DAILY WITH A MEAL   metoprolol tartrate 25 MG tablet Commonly known as:  LOPRESSOR Take 1 tablet (25 mg total) by mouth 2 (two) times daily. Notes to patient:  2nd dose this afternoon   nitroGLYCERIN 0.4 MG SL tablet Commonly known as:  NITROSTAT Place 1 tablet (0.4 mg total) under the tongue every 5 (five) minutes as needed for chest pain.   PROAIR HFA 108 (90 Base) MCG/ACT inhaler Generic  drug:  albuterol Inhale 2 puffs into the lungs 4 (four) times daily as needed.   VISION FORMULA/LUTEIN PO Take by mouth daily.   Vitamin D (Ergocalciferol) 1.25 MG (50000 UT) Caps capsule Commonly known as:  DRISDOL Take 50,000 Units by mouth once a week.       Vitals:   12/15/18 0453 12/15/18 0755  BP: (!) 114/48 123/60  Pulse: 69 67  Resp: 20 17  Temp: 98.5 F (36.9 C) 98 F (36.7 C)  SpO2: 96% 96%    Skin clean, dry and intact without evidence of skin break down, no evidence of skin tears noted. IV catheter discontinued intact. Site without signs and symptoms of complications. Dressing and pressure applied. Pt denies pain at this time. No complaints noted.  An After Visit Summary was printed and given to the patient. Patient escorted via WC, and D/C home via private auto.  Tanya Cole Tanya Cole

## 2018-12-15 NOTE — Discharge Instructions (Signed)
Resume diet and activity as before ° ° °

## 2018-12-17 ENCOUNTER — Telehealth: Payer: Self-pay | Admitting: Cardiovascular Disease

## 2018-12-17 ENCOUNTER — Telehealth: Payer: Self-pay

## 2018-12-17 NOTE — Telephone Encounter (Signed)
Transition Care Management Follow-up Telephone Call  How have you been since you were released from the hospital? Patient state she is feeling much better at this time. Denies any chest pain since discharge. Patient stated that before going to hospital she just felt this feeling come over her and then she started to feel a lot of pressure in her chest. Dr. Kirke Corin office setting up stress for patient FYI.   Do you understand why you were in the hospital? yes   Do you understand the discharge instrcutions? yes  Items Reviewed:  Medications reviewed: yes  Allergies reviewed: yes  Dietary changes reviewed: yes  Referrals reviewed: yes   Functional Questionnaire:   Activities of Daily Living (ADLs):   She states they are independent in the following: ambulation, bathing and hygiene, feeding, continence, grooming, toileting and dressing States they require assistance with the following: No assistance needed at this time.   Any transportation issues/concerns?: no   Any patient concerns? no   Confirmed importance and date/time of follow-up visits scheduled: yes   Confirmed with patient if condition begins to worsen call PCP or go to the ER.  Patient was given the Call-a-Nurse line [phone]: yes

## 2018-12-17 NOTE — Discharge Summary (Signed)
[facility] - [place] [facility]   PATIENT NAME: Tanya Cole    MR#:  [id number]  DATE OF BIRTH:  09/01/1933  DATE OF ADMISSION:  12/14/2018 ADMITTING PHYSICIAN: Milagros LollSrikar Estelita Iten, MD  DATE OF DISCHARGE: 12/15/2018  2:22 PM  PRIMARY CARE PHYSICIAN: Dale DurhamScott, Charlene, MD   ADMISSION DIAGNOSIS:  Chest pain, unspecified type [R07.9]  DISCHARGE DIAGNOSIS:  Active Problems:   Chest pain   SECONDARY DIAGNOSIS:   Past Medical History:  Diagnosis Date  . Aortic insufficiency    a. TTE 12/19: EF 55-60%, probable HK of the mid apical anterior septal myocardium, Gr1DD, mild AI, mildly dilated LA  . Asthma   . CAD (coronary artery disease)    a. NSTEMI 12/19; b. LHC 10/08/18: LM minimal luminal irregs, mLAD-1 95% s/p PCI/DES, mLAD-2 60%, LCx mild diffuse disease throughout, RCA minimal luminal irregs  . Diabetes mellitus (HCC)   . Hypercholesterolemia   . Hypertension   . Osteopenia   . Polymyalgia rheumatica syndrome (HCC)   . Reactive airway disease      ADMITTING HISTORY  HISTORY OF PRESENT ILLNESS:  Tanya Cole  is a 85 y.o. female with a known history of hypertension, diabetes, CAD with non-ST elevation MI in December 2019 and PCI with drug-eluting stent to mid LAD presents to the emergency room with acute onset of chest pressure while eating breakfast.  Patient turned extremely pale and fatigued with shortness of breath.  This slowly improved by the time she arrived to the emergency room.  Her pressure was rated at 10/10 initially and presently at 5/10.  Did not take sublingual nitro at home and has not been given in the emergency room.  She had chest pain during her episode of non-ST elevation MI in today she says this is different at this pressure.  No nausea or vomiting.  No prior symptoms of GERD.  Afebrile.  Has been taking all her medications.  Is on dual antiplatelet therapy with aspirin and Plavix.  Does not smoke.  Does not drink alcohol.  Referral made to  cardiac rehab at discharge but patient has been not following due to lack of transport.   HOSPITAL COURSE:   *Chest pressure.  Patient was admitted to telemetry floor.  Troponin, EKG, telemetry with nothing acute.  Patient was seen by cardiology.  By the day of discharge patient's chest pressure has resolved.  Her lisinopril dose was reduced.  Imdur added.  Discharged home in stable condition after she ambulated with no chest pain.  Follow-up as outpatient with Dr. Kirke CorinArida.  *Hypertension.  Well controlled.  Imdur was started and lisinopril dose decreased.  Continue beta-blocker.  Diabetes mellitus stable  Patient was discharged home  CONSULTS OBTAINED:  Treatment Team:  Antonieta IbaGollan, Timothy J, MD  DRUG ALLERGIES:   Allergies  Allergen Reactions  . Tramadol Itching    Nausea    DISCHARGE MEDICATIONS:   Allergies as of 12/15/2018      Reactions   Tramadol Itching   Nausea      Medication List    TAKE these medications   ADVAIR DISKUS 250-50 MCG/DOSE Aepb Generic drug:  Fluticasone-Salmeterol INHALE ONE PUFF BY MOUTH EVERY 12 HOURS. RINSE MOUTH AFTER EACH USE   alendronate 70 MG tablet Commonly known as:  FOSAMAX 70 mg once a week.   aspirin 81 MG tablet Take 81 mg by mouth daily.   atorvastatin 40 MG tablet Commonly known as:  LIPITOR Take 1 tablet (40 mg total) by mouth  daily at 6 PM.   clopidogrel 75 MG tablet Commonly known as:  PLAVIX Take 1 tablet (75 mg total) by mouth daily with breakfast.   cyanocobalamin 1000 MCG/ML injection Commonly known as:  (VITAMIN B-12) Inject into the muscle.   fluticasone 50 MCG/ACT nasal spray Commonly known as:  FLONASE USE 2 SPRAY(S) IN EACH NOSTRIL ONCE DAILY What changed:  See the new instructions.   isosorbide mononitrate 30 MG 24 hr tablet Commonly known as:  IMDUR Take 1 tablet (30 mg total) by mouth daily.   lisinopril 10 MG tablet Commonly known as:  PRINIVIL,ZESTRIL Take 1 tablet (10 mg total) by mouth  daily. What changed:    medication strength  how much to take   metFORMIN 500 MG tablet Commonly known as:  GLUCOPHAGE TAKE 1 TABLET BY MOUTH TWICE DAILY WITH A MEAL   metoprolol tartrate 25 MG tablet Commonly known as:  LOPRESSOR Take 1 tablet (25 mg total) by mouth 2 (two) times daily. Notes to patient:  2nd dose this afternoon   nitroGLYCERIN 0.4 MG SL tablet Commonly known as:  NITROSTAT Place 1 tablet (0.4 mg total) under the tongue every 5 (five) minutes as needed for chest pain.   PROAIR HFA 108 (90 Base) MCG/ACT inhaler Generic drug:  albuterol Inhale 2 puffs into the lungs 4 (four) times daily as needed.   VISION FORMULA/LUTEIN PO Take by mouth daily.   Vitamin D (Ergocalciferol) 1.25 MG (50000 UT) Caps capsule Commonly known as:  DRISDOL Take 50,000 Units by mouth once a week.       Today   VITAL SIGNS:  Blood pressure 123/60, pulse 67, temperature 98 F (36.7 C), temperature source Oral, resp. rate 17, height 5\' 1"  (1.549 m), weight 53 kg, SpO2 96 %.  I/O:  No intake or output data in the 24 hours ending 12/17/18 1354  PHYSICAL EXAMINATION:  Physical Exam  GENERAL:  85 y.o.-year-old patient lying in the bed with no acute distress.  LUNGS: Normal breath sounds bilaterally, no wheezing, rales,rhonchi or crepitation. No use of accessory muscles of respiration.  CARDIOVASCULAR: S1, S2 normal. No murmurs, rubs, or gallops.  ABDOMEN: Soft, non-tender, non-distended. Bowel sounds present. No organomegaly or mass.  NEUROLOGIC: Moves all 4 extremities. PSYCHIATRIC: The patient is alert and oriented x 3.  SKIN: No obvious rash, lesion, or ulcer.   DATA REVIEW:   CBC Recent Labs  Lab 12/14/18 0908  WBC 9.3  HGB 13.8  HCT 42.9  PLT 284    Chemistries  Recent Labs  Lab 12/14/18 0908  NA 139  K 4.1  CL 105  CO2 24  GLUCOSE 201*  BUN 15  CREATININE 0.77  CALCIUM 9.4    Cardiac Enzymes Recent Labs  Lab 12/14/18 1852  TROPONINI <0.03     Microbiology Results  Results for orders placed or performed in visit on 01/16/15  CULTURE, URINE COMPREHENSIVE     Status: None   Collection Time: 01/16/15  3:05 PM  Result Value Ref Range Status   Culture KLEBSIELLA PNEUMONIAE  Final   Colony Count >=100,000 COLONIES/ML  Final   Organism ID, Bacteria KLEBSIELLA PNEUMONIAE  Final      Susceptibility   Klebsiella pneumoniae -  (no method available)    AMPICILLIN >=32 Resistant     AMOX/CLAVULANIC 4 Sensitive     AMPICILLIN/SULBACTAM 16 Intermediate     PIP/TAZO 16 Sensitive     IMIPENEM <=0.25 Sensitive     CEFAZOLIN <=4 Sensitive  CEFTRIAXONE <=1 Sensitive     CEFTAZIDIME <=1 Sensitive     CEFEPIME <=1 Sensitive     GENTAMICIN <=1 Sensitive     TOBRAMYCIN <=1 Sensitive     CIPROFLOXACIN <=0.25 Sensitive     LEVOFLOXACIN 0.5 Sensitive     NITROFURANTOIN 64 Intermediate     TRIMETH/SULFA <=20 Sensitive     RADIOLOGY:  No results found.  Follow up with PCP in 1 week.  Management plans discussed with the patient, family and they are in agreement.  CODE STATUS:  Code Status History    Date Active Date Inactive Code Status Order ID Comments User Context   12/14/2018 2113 12/15/2018 1727 DNR [id number]  Barbaraann RondoSridharan, Prasanna, MD Inpatient   12/14/2018 1123 12/14/2018 2113 Full Code [id number]  Milagros LollSudini, Dollie Mayse, MD ED   10/07/2018 1818 10/09/2018 1637 DNR [id number]  Salary, Evelena AsaMontell D, MD ED   10/07/2018 1818 10/07/2018 1818 Full Code [id number]  Salary, Evelena AsaMontell D, MD ED    Questions for Most Recent Historical Code Status (Order [id number])    Question Answer Comment   In the event of cardiac or respiratory ARREST Do not call a "code blue"    In the event of cardiac or respiratory ARREST Do not perform Intubation, CPR, defibrillation or ACLS    In the event of cardiac or respiratory ARREST Use medication by any route, position, wound care, and other measures to relive pain and suffering. May use oxygen, suction and manual treatment of  airway obstruction as needed for comfort.         Advance Directive Documentation     Most Recent Value  Type of Advance Directive  Healthcare Power of Attorney  Pre-existing out of facility DNR order (yellow form or pink MOST form)  -  "MOST" Form in Place?  -      TOTAL TIME TAKING CARE OF THIS PATIENT ON DAY OF DISCHARGE: more than 30 minutes.   Molinda BailiffSrikar R Kinesha Auten M.D on 12/17/2018 at 1:54 PM  Between 7am to 6pm - Pager - [phone]  After 6pm go to [email or web address] - password [email or web address] [facility]  [facility]  Office  [phone]  CC: Primary care physician; Dale DurhamScott, Charlene, MD  Note: This dictation was prepared with Dragon dictation along with smaller phrase technology. Any transcriptional errors that result from this process are unintentional.

## 2018-12-17 NOTE — Telephone Encounter (Signed)
Copied from CRM [id number]. Topic: Appointment Scheduling - Scheduling Inquiry for Clinic >> Dec 17, 2018  8:24 AM Fanny Bien wrote: Reason for CRM: pt called and stated that she would like to be worked in as soon as possible for a hospital follow up. Please advise

## 2018-12-17 NOTE — Telephone Encounter (Signed)
Patient daughter Azell Der calling States that patient was seen in ED recently and they want patient to have a stress test completed ASAP Would need a sooner appointment than 3/26 with Dr Kirke Corin - nothing sooner available at this time, earliest 3/18 with APP Please advise on possible sooner appointment

## 2018-12-17 NOTE — Telephone Encounter (Signed)
Attempted to call patient. LMTCB 12/17/2018   

## 2018-12-18 ENCOUNTER — Ambulatory Visit (INDEPENDENT_AMBULATORY_CARE_PROVIDER_SITE_OTHER): Payer: Medicare Other

## 2018-12-18 ENCOUNTER — Ambulatory Visit (INDEPENDENT_AMBULATORY_CARE_PROVIDER_SITE_OTHER): Payer: Medicare Other | Admitting: Cardiovascular Disease

## 2018-12-18 ENCOUNTER — Encounter: Payer: Self-pay | Admitting: Cardiovascular Disease

## 2018-12-18 VITALS — BP 134/60 | HR 87 | Ht 61.0 in | Wt 122.0 lb

## 2018-12-18 DIAGNOSIS — I10 Essential (primary) hypertension: Secondary | ICD-10-CM

## 2018-12-18 DIAGNOSIS — E538 Deficiency of other specified B group vitamins: Secondary | ICD-10-CM | POA: Diagnosis not present

## 2018-12-18 DIAGNOSIS — I351 Nonrheumatic aortic (valve) insufficiency: Secondary | ICD-10-CM

## 2018-12-18 DIAGNOSIS — E785 Hyperlipidemia, unspecified: Secondary | ICD-10-CM

## 2018-12-18 DIAGNOSIS — I491 Atrial premature depolarization: Secondary | ICD-10-CM | POA: Diagnosis not present

## 2018-12-18 DIAGNOSIS — R079 Chest pain, unspecified: Secondary | ICD-10-CM | POA: Diagnosis not present

## 2018-12-18 DIAGNOSIS — I25118 Atherosclerotic heart disease of native coronary artery with other forms of angina pectoris: Secondary | ICD-10-CM

## 2018-12-18 MED ORDER — CYANOCOBALAMIN 1000 MCG/ML IJ SOLN
1000.0000 ug | Freq: Once | INTRAMUSCULAR | Status: AC
  Administered 2018-12-18: 1000 ug via INTRAMUSCULAR

## 2018-12-18 NOTE — Telephone Encounter (Signed)
Spoke with patient's daughter. Patient's currently scheduled appointment is not until March. Patient was in hospital over the weekend and there was some talk of if she needed a stress test. They were told they would need to see Dr Kirke Corin to make that determination. Dr Kirke Corin had same day appointment slot open for this afternoon. Patient scheduled to see him today at 4:40 pm. She was appreciative.

## 2018-12-18 NOTE — Progress Notes (Signed)
Pt was seen today for a NV for B-12 shot given in LD. Pt tolerated well.

## 2018-12-18 NOTE — Telephone Encounter (Signed)
Per note, has appt scheduled for 12/25/18.  Let me know if any problems.

## 2018-12-18 NOTE — Patient Instructions (Signed)
Medication Instructions:  No changes If you need a refill on your cardiac medications before your next appointment, please call your pharmacy.   Lab work: None ordered  Testing/Procedures: Your physician has requested that you have a lexiscan myoview. For further information please visit [email or web address] Please follow instruction sheet, as given.  Follow-Up: . Keep your appointemnt on 01/24/2019 at 9 am with Dr. Kirke Corin  Any Other Special Instructions Will Be Listed Below (If Applicable).  [facility] MYOVIEW  Your provider has ordered a Stress Test with nuclear imaging. The purpose of this test is to evaluate the blood supply to your heart muscle. This procedure is referred to as a "Non-Invasive Stress Test." This is because other than having an IV started in your vein, nothing is inserted or "invades" your body. Cardiac stress tests are done to find areas of poor blood flow to the heart by determining the extent of coronary artery disease (CAD). Some patients exercise on a treadmill, which naturally increases the blood flow to your heart, while others who are unable to walk on a treadmill due to physical limitations have a pharmacologic/chemical stress agent called Lexiscan . This medicine will mimic walking on a treadmill by temporarily increasing your coronary blood flow.   Please note: these test may take anywhere between 2-4 hours to complete  PLEASE REPORT TO [facility] MEDICAL MALL ENTRANCE  THE VOLUNTEERS AT THE FIRST DESK WILL DIRECT YOU WHERE TO GO  Date of Procedure:_____________________________________  Arrival Time for Procedure:______________________________  Instructions regarding medication:   __X__ : Hold diabetes medication morning of procedure   PLEASE NOTIFY THE OFFICE AT LEAST 24 HOURS IN ADVANCE IF YOU ARE UNABLE TO KEEP YOUR APPOINTMENT.  [phone] AND  PLEASE NOTIFY NUCLEAR MEDICINE AT [facility] AT LEAST 24 HOURS IN ADVANCE IF YOU ARE UNABLE TO KEEP YOUR APPOINTMENT.  [phone]  How to prepare for your Myoview test:  1. Do not eat or drink after midnight 2. No caffeine for 24 hours prior to test 3. No smoking 24 hours prior to test. 4. Your medication may be taken with water.  If your doctor stopped a medication because of this test, do not take that medication. 5. Ladies, please do not wear dresses.  Skirts or pants are appropriate. Please wear a short sleeve shirt. 6. No perfume, cologne or lotion. 7. Wear comfortable walking shoes. No heels!

## 2018-12-18 NOTE — Progress Notes (Signed)
Cardiology Office Note   Date:  12/18/2018   ID:  Tanya, Cole Mar 16, 1933, MRN [id number]  PCP:  Dale [place], MD  Cardiologist:   Lorine Bears, MD   Chief Complaint  Patient presents with  . other    ER f/u 12/14/2018. Medications reviewed verbally with patient.        History of Present Illness: Tanya Cole is a 85 y.o. female who presents for a follow-up visit regarding coronary artery disease and recent hospitalization for chest pain.  She has known history of diabetes mellitus, hypertension, hyperlipidemia, asthma and PMR. She was hospitalized in December 2019 with non-ST elevation myocardial infarction.  Echocardiogram showed normal LV systolic function.  Cardiac catheterization showed 95% mid LAD stenosis which was the culprit and a more distal 60% mid LAD stenosis that was left to be treated medically with minimal irregularities affecting the RCA and left circumflex.  I performed successful PCI and drug-eluting stent placement to the mid LAD. Last Friday, she had an episode of substernal chest discomfort that tightness that lasted for about 30 seconds and was relieved by nitroglycerin in the ED.  She did not use nitroglycerin at home.  She went to [facility] where her troponin was negative.  She had no recurrent chest pain and was discharged home for close follow-up.  She has not had any further episodes since Friday.  No shortness of breath.   Past Medical History:  Diagnosis Date  . Aortic insufficiency    a. TTE 12/19: EF 55-60%, probable HK of the mid apical anterior septal myocardium, Gr1DD, mild AI, mildly dilated LA  . Asthma   . CAD (coronary artery disease)    a. NSTEMI 12/19; b. LHC 10/08/18: LM minimal luminal irregs, mLAD-1 95% s/p PCI/DES, mLAD-2 60%, LCx mild diffuse disease throughout, RCA minimal luminal irregs  . Diabetes mellitus (HCC)   . Hypercholesterolemia   . Hypertension   . Osteopenia   . Polymyalgia rheumatica syndrome (HCC)   .  Reactive airway disease     Past Surgical History:  Procedure Laterality Date  . ABDOMINAL HYSTERECTOMY  1981   prolapse and bleeding, ovaries not removed  . BREAST EXCISIONAL BIOPSY Right   . CORONARY STENT INTERVENTION N/A 10/08/2018   Procedure: CORONARY STENT INTERVENTION;  Surgeon: Iran Ouch, MD;  Location: [facility] INVASIVE CV LAB;  Service: Cardiovascular;  Laterality: N/A;  . LEFT HEART CATH AND CORONARY ANGIOGRAPHY N/A 10/08/2018   Procedure: LEFT HEART CATH AND CORONARY ANGIOGRAPHY;  Surgeon: Iran Ouch, MD;  Location: [facility] INVASIVE CV LAB;  Service: Cardiovascular;  Laterality: N/A;  . UMBILICAL HERNIA REPAIR  7/94     Current Outpatient Medications  Medication Sig Dispense Refill  . ADVAIR DISKUS 250-50 MCG/DOSE AEPB INHALE ONE PUFF BY MOUTH EVERY 12 HOURS. RINSE MOUTH AFTER EACH USE 60 each 5  . alendronate (FOSAMAX) 70 MG tablet 70 mg once a week.     Marland Kitchen aspirin 81 MG tablet Take 81 mg by mouth daily.    Marland Kitchen atorvastatin (LIPITOR) 40 MG tablet Take 1 tablet (40 mg total) by mouth daily at 6 PM. 90 tablet 3  . clopidogrel (PLAVIX) 75 MG tablet Take 1 tablet (75 mg total) by mouth daily with breakfast. 90 tablet 3  . cyanocobalamin (,VITAMIN B-12,) 1000 MCG/ML injection Inject into the muscle.    . fluticasone (FLONASE) 50 MCG/ACT nasal spray USE 2 SPRAY(S) IN EACH NOSTRIL ONCE DAILY (Patient taking differently: Place 1 spray into both  nostrils daily. ) 16 g 3  . isosorbide mononitrate (IMDUR) 30 MG 24 hr tablet Take 1 tablet (30 mg total) by mouth daily. 30 tablet 0  . lisinopril (PRINIVIL,ZESTRIL) 10 MG tablet Take 1 tablet (10 mg total) by mouth daily. 30 tablet 0  . metFORMIN (GLUCOPHAGE) 500 MG tablet TAKE 1 TABLET BY MOUTH TWICE DAILY WITH A MEAL 180 tablet 1  . metoprolol tartrate (LOPRESSOR) 25 MG tablet Take 1 tablet (25 mg total) by mouth 2 (two) times daily. 90 tablet 3  . Multiple Vitamins-Minerals (VISION FORMULA/LUTEIN PO) Take by mouth daily.    .  nitroGLYCERIN (NITROSTAT) 0.4 MG SL tablet Place 1 tablet (0.4 mg total) under the tongue every 5 (five) minutes as needed for chest pain. 30 tablet 12  . PROAIR HFA 108 (90 Base) MCG/ACT inhaler Inhale 2 puffs into the lungs 4 (four) times daily as needed.  2  . Vitamin D, Ergocalciferol, (DRISDOL) 50000 units CAPS capsule Take 50,000 Units by mouth once a week.  3   No current facility-administered medications for this visit.     Allergies:   Tramadol    Social History:  The patient  reports that she has never smoked. She has never used smokeless tobacco. She reports that she does not drink alcohol or use drugs.   Family History:  The patient's family history includes Arthritis in her mother; Heart attack in her father; Heart disease in her mother; Throat cancer in her sister.    ROS:  Please see the history of present illness.   Otherwise, review of systems are positive for none.   All other systems are reviewed and negative.    PHYSICAL EXAM: VS:  BP 134/60 (BP Location: Left Arm, Patient Position: Sitting, Cuff Size: Normal)   Pulse 87   Ht 5\' 1"  (1.549 m)   Wt 122 lb (55.3 kg)   BMI 23.05 kg/m  , BMI Body mass index is 23.05 kg/m. GEN: Well nourished, well developed, in no acute distress  HEENT: normal  Neck: no JVD, carotid bruits, or masses Cardiac: RRR; no murmurs, rubs, or gallops,no edema  Respiratory:  clear to auscultation bilaterally, normal work of breathing GI: soft, nontender, nondistended, + BS MS: no deformity or atrophy  Skin: warm and dry, no rash Neuro:  Strength and sensation are intact Psych: euthymic mood, full affect Radial pulses normal bilaterally.  EKG:  EKG is ordered today. The ekg ordered today demonstrates normal sinus rhythm with sinus arrhythmia and frequent PVCs.   Recent Labs: 10/07/2018: ALT 12 10/08/2018: TSH 1.141 10/18/2018: Magnesium 1.9 12/14/2018: BUN 15; Creatinine, Ser 0.77; Hemoglobin 13.8; Platelets 284; Potassium 4.1; Sodium  139    Lipid Panel    Component Value Date/Time   CHOL 172 10/08/2018 1656   TRIG 349 (H) 10/08/2018 1656   HDL 49 10/08/2018 1656   CHOLHDL 3.5 10/08/2018 1656   VLDL 70 (H) 10/08/2018 1656   LDLCALC 53 10/08/2018 1656   LDLDIRECT 85.0 06/15/2017 0815      Wt Readings from Last 3 Encounters:  12/18/18 122 lb (55.3 kg)  12/15/18 116 lb 13.5 oz (53 kg)  11/08/18 123 lb (55.8 kg)     No flowsheet data found.    ASSESSMENT AND PLAN:  1.  Coronary artery disease involving native coronary arteries with other forms of angina: Recent chest pain similar to her prior myocardial infarction with negative troponin.  She has not missed any of her dual antiplatelet therapy and has been taking  her medications regularly. She was discharged home on small dose of Imdur. I recommend urgent evaluation with a Lexiscan Myoview with low threshold to proceed with repeat angiography if needed given her residual mid LAD stenosis.  2.  Essential hypertension: Lisinopril was decreased recently given the addition of Imdur.  Blood pressure is controlled on current medications.  3.  Hyperlipidemia: Continue treatment with atorvastatin.  Lipid profile in December showed an LDL of 53.  4.  Frequent PVCs: Noted on EKG today but she appears to be asymptomatic.  We have to exclude an ischemic etiology.    Disposition:   FU with me in 1 month  Signed,  Lorine BearsMuhammad , MD  12/18/2018 4:35 PM    [facility]

## 2018-12-24 DIAGNOSIS — M353 Polymyalgia rheumatica: Secondary | ICD-10-CM | POA: Diagnosis not present

## 2018-12-25 ENCOUNTER — Encounter: Payer: Self-pay | Admitting: Internal Medicine

## 2018-12-25 ENCOUNTER — Ambulatory Visit (INDEPENDENT_AMBULATORY_CARE_PROVIDER_SITE_OTHER): Payer: Medicare Other | Admitting: Internal Medicine

## 2018-12-25 DIAGNOSIS — I10 Essential (primary) hypertension: Secondary | ICD-10-CM | POA: Diagnosis not present

## 2018-12-25 DIAGNOSIS — E1159 Type 2 diabetes mellitus with other circulatory complications: Secondary | ICD-10-CM

## 2018-12-25 DIAGNOSIS — E7800 Pure hypercholesterolemia, unspecified: Secondary | ICD-10-CM

## 2018-12-25 DIAGNOSIS — I2510 Atherosclerotic heart disease of native coronary artery without angina pectoris: Secondary | ICD-10-CM | POA: Diagnosis not present

## 2018-12-25 NOTE — Progress Notes (Signed)
[name], female   DOB: 16-Oct-1933, 85 y.o.   MRN: [id number]   Subjective:    [name], female    DOB: [birth year], 85 y.o.   MRN: [id number]  HPI  Patient here for hospital follow up.  She was admitted 12/14/18 and discharged 12/15/18 with chest pain.  Ruled out for MI.  No further chest pain.  saw Dr Fletcher Anon 12/18/18.  Scheduled for stress test 01/02/19.  States she has been doing relatively well.  Did start with cough yesterday. No fever.  Minimal drainage.  No wheezing and no increased sob.  No acid reflux.  No abdominal pain.  Bowels moving.  Handling stress.  Overall she feels she is doing relatively well.  States sugars have been doing well.  AM sugars 120s.     Past Medical History:  Diagnosis Date  . Aortic insufficiency    a. TTE 12/19: EF 55-60%, probable HK of the mid apical anterior septal myocardium, Gr1DD, mild AI, mildly dilated LA  . Asthma   . CAD (coronary artery disease)    a. NSTEMI 12/19; b. LHC 10/08/18: LM minimal luminal irregs, mLAD-1 95% s/p PCI/DES, mLAD-2 60%, LCx mild diffuse disease throughout, RCA minimal luminal irregs  . Diabetes mellitus ([place])   . Hypercholesterolemia   . Hypertension   . Osteopenia   . Polymyalgia rheumatica syndrome ([place])   . Reactive airway disease    Past Surgical History:  Procedure Laterality Date  . ABDOMINAL HYSTERECTOMY  1981   prolapse and bleeding, ovaries not removed  . BREAST EXCISIONAL BIOPSY Right   . CORONARY STENT INTERVENTION N/A 10/08/2018   Procedure: CORONARY STENT INTERVENTION;  Surgeon: Wellington Hampshire, MD;  Location: [facility];  Service: Cardiovascular;  Laterality: N/A;  . LEFT HEART CATH AND CORONARY ANGIOGRAPHY N/A 10/08/2018   Procedure: LEFT HEART CATH AND CORONARY ANGIOGRAPHY;  Surgeon: Wellington Hampshire, MD;  Location: [facility];  Service: Cardiovascular;  Laterality: N/A;  . UMBILICAL HERNIA REPAIR  7/94   Family History  Problem Relation Age  of Onset  . Heart attack Father   . Arthritis Mother   . Heart disease Mother   . Throat cancer Sister    Social History   Socioeconomic History  . Marital status: Widowed    Spouse name: Not on file  . Number of children: 3  . Years of education: Not on file  . Highest education level: Not on file  Occupational History  . Not on file  Social Needs  . Financial resource strain: Not hard at all  . Food insecurity:    Worry: Never true    Inability: Never true  . Transportation needs:    Medical: No    Non-medical: No  Tobacco Use  . Smoking status: Never Smoker  . Smokeless tobacco: Never Used  Substance and Sexual Activity  . Alcohol use: No    Alcohol/week: 0.0 standard drinks  . Drug use: No  . Sexual activity: Never  Lifestyle  . Physical activity:    Days per week: Not on file    Minutes per session: Not on file  . Stress: Not on file  Relationships  . Social connections:    Talks on phone: Not on file    Gets together: Not on file    Attends religious service: Not on file    Active member of club or organization: Not on file    Attends meetings of  clubs or organizations: Not on file    Relationship status: Not on file  Other Topics Concern  . Not on file  Social History Narrative  . Not on file    Outpatient Encounter Medications as of 12/25/2018  Medication Sig  . ADVAIR DISKUS 250-50 MCG/DOSE AEPB INHALE ONE PUFF BY MOUTH EVERY 12 HOURS. RINSE MOUTH AFTER EACH USE  . alendronate (FOSAMAX) 70 MG tablet 70 mg once a week.   Marland Kitchen aspirin 81 MG tablet Take 81 mg by mouth daily.  Marland Kitchen atorvastatin (LIPITOR) 40 MG tablet Take 1 tablet (40 mg total) by mouth daily at 6 PM.  . clopidogrel (PLAVIX) 75 MG tablet Take 1 tablet (75 mg total) by mouth daily with breakfast.  . cyanocobalamin (,VITAMIN B-12,) 1000 MCG/ML injection Inject into the muscle.  . fluticasone (FLONASE) 50 MCG/ACT nasal spray USE 2 SPRAY(S) IN EACH NOSTRIL ONCE DAILY (Patient taking differently:  Place 1 spray into both nostrils daily. )  . isosorbide mononitrate (IMDUR) 30 MG 24 hr tablet Take 1 tablet (30 mg total) by mouth daily.  Marland Kitchen lisinopril (PRINIVIL,ZESTRIL) 10 MG tablet Take 1 tablet (10 mg total) by mouth daily.  . metFORMIN (GLUCOPHAGE) 500 MG tablet TAKE 1 TABLET BY MOUTH TWICE DAILY WITH A MEAL  . metoprolol tartrate (LOPRESSOR) 25 MG tablet Take 1 tablet (25 mg total) by mouth 2 (two) times daily.  . Multiple Vitamins-Minerals (VISION FORMULA/LUTEIN PO) Take by mouth daily.  . nitroGLYCERIN (NITROSTAT) 0.4 MG SL tablet Place 1 tablet (0.4 mg total) under the tongue every 5 (five) minutes as needed for chest pain.  Marland Kitchen PROAIR HFA 108 (90 Base) MCG/ACT inhaler Inhale 2 puffs into the lungs 4 (four) times daily as needed.  . Vitamin D, Ergocalciferol, (DRISDOL) 50000 units CAPS capsule Take 50,000 Units by mouth once a week.   No facility-administered encounter medications on file as of 12/25/2018.     Review of Systems  Constitutional: Negative for appetite change and unexpected weight change.  HENT: Negative for congestion and sinus pressure.   Respiratory: Positive for cough. Negative for chest tightness and shortness of breath.   Cardiovascular: Negative for chest pain, palpitations and leg swelling.  Gastrointestinal: Negative for abdominal pain, diarrhea, nausea and vomiting.  Genitourinary: Negative for difficulty urinating and dysuria.  Musculoskeletal: Negative for back pain and joint swelling.  Skin: Negative for color change and rash.  Neurological: Negative for dizziness, light-headedness and headaches.  Psychiatric/Behavioral: Negative for agitation and dysphoric mood.       Objective:    Physical Exam Constitutional:      General: She is not in acute distress.    Appearance: Normal appearance.  HENT:     Nose: Nose normal. No congestion.     Mouth/Throat:     Pharynx: No oropharyngeal exudate or posterior oropharyngeal erythema.  Neck:      Musculoskeletal: Neck supple. No muscular tenderness.     Thyroid: No thyromegaly.  Cardiovascular:     Rate and Rhythm: Normal rate and regular rhythm.  Pulmonary:     Effort: No respiratory distress.     Breath sounds: Normal breath sounds. No wheezing.  Abdominal:     General: Bowel sounds are normal.     Palpations: Abdomen is soft.     Tenderness: There is no abdominal tenderness.  Musculoskeletal:        General: No swelling or tenderness.  Lymphadenopathy:     Cervical: No cervical adenopathy.  Skin:    Findings: No  erythema or rash.  Neurological:     Mental Status: She is alert.  Psychiatric:        Mood and Affect: Mood normal.        Behavior: Behavior normal.     BP 128/64   Pulse 61   Temp 98.1 F (36.7 C) (Oral)   Resp 16   Wt 120 lb 3.2 oz (54.5 kg)   SpO2 96%   BMI 22.71 kg/m  Wt Readings from Last 3 Encounters:  12/25/18 120 lb 3.2 oz (54.5 kg)  12/18/18 122 lb (55.3 kg)  12/15/18 116 lb 13.5 oz (53 kg)     Lab Results  Component Value Date   WBC 9.3 12/14/2018   HGB 13.8 12/14/2018   HCT 42.9 12/14/2018   PLT 284 12/14/2018   GLUCOSE 201 (H) 12/14/2018   CHOL 172 10/08/2018   TRIG 349 (H) 10/08/2018   HDL 49 10/08/2018   LDLDIRECT 85.0 06/15/2017   LDLCALC 53 10/08/2018   ALT 12 10/07/2018   AST 31 10/07/2018   NA 139 12/14/2018   K 4.1 12/14/2018   CL 105 12/14/2018   CREATININE 0.77 12/14/2018   BUN 15 12/14/2018   CO2 24 12/14/2018   TSH 1.141 10/08/2018   HGBA1C 6.4 (H) 12/14/2018   MICROALBUR 2.3 (H) 06/15/2017    Dg Chest 2 View  Result Date: 12/14/2018 CLINICAL DATA:  Chest pain. EXAM: CHEST - 2 VIEW COMPARISON:  Radiograph of October 07, 2018. FINDINGS: The heart size and mediastinal contours are within normal limits. No pneumothorax or pleural effusion is noted. Atherosclerosis of thoracic aorta is noted. Right lung is clear. Mild left basilar subsegmental atelectasis is noted. The visualized skeletal structures are  unremarkable. IMPRESSION: Mild left basilar subsegmental atelectasis. Aortic Atherosclerosis (ICD10-I70.0). Electronically Signed   By: Marijo Conception, M.D.   On: 12/14/2018 09:51       Assessment & Plan:   Problem List Items Addressed This Visit    CAD (coronary artery disease)    With known CAD.  Recent admission for chest pain.  Ruled out.  Saw Dr Fletcher Anon.  Planning for stress test 3//4/20.  No further chest pain.  Follow.        Diabetes mellitus with cardiac complication (HCC)    Low carb diet and exercise.  Follow met b and a1c.       Hypercholesterolemia    On lipitor.  Low cholesterol diet and exercise.  Follow lipid panel and liver function tests.        Hypertension    Blood pressure under good control.  Continue same medication regimen.  Follow pressures.  Follow metabolic panel.            Einar Pheasant, MD

## 2018-12-28 DIAGNOSIS — R05 Cough: Secondary | ICD-10-CM | POA: Diagnosis not present

## 2018-12-28 DIAGNOSIS — J40 Bronchitis, not specified as acute or chronic: Secondary | ICD-10-CM | POA: Diagnosis not present

## 2018-12-29 ENCOUNTER — Encounter: Payer: Self-pay | Admitting: Internal Medicine

## 2018-12-29 DIAGNOSIS — I25118 Atherosclerotic heart disease of native coronary artery with other forms of angina pectoris: Secondary | ICD-10-CM | POA: Insufficient documentation

## 2018-12-29 DIAGNOSIS — I2510 Atherosclerotic heart disease of native coronary artery without angina pectoris: Secondary | ICD-10-CM | POA: Insufficient documentation

## 2018-12-29 NOTE — Assessment & Plan Note (Signed)
Blood pressure under good control.  Continue same medication regimen.  Follow pressures.  Follow metabolic panel.   

## 2018-12-29 NOTE — Assessment & Plan Note (Signed)
Low carb diet and exercise.  Follow met b and a1c.  

## 2018-12-29 NOTE — Assessment & Plan Note (Signed)
With known CAD.  Recent admission for chest pain.  Ruled out.  Saw Dr Kirke Corin.  Planning for stress test 3//4/20.  No further chest pain.  Follow.

## 2018-12-29 NOTE — Assessment & Plan Note (Signed)
On lipitor.  Low cholesterol diet and exercise.  Follow lipid panel and liver function tests.   

## 2019-01-02 ENCOUNTER — Ambulatory Visit
Admission: RE | Admit: 2019-01-02 | Discharge: 2019-01-02 | Disposition: A | Discharge status: Home / Self Care | Payer: Medicare Other | Source: Ambulatory Visit | Attending: Cardiovascular Disease | Admitting: Cardiovascular Disease

## 2019-01-02 ENCOUNTER — Other Ambulatory Visit: Payer: Self-pay

## 2019-01-02 DIAGNOSIS — I2510 Atherosclerotic heart disease of native coronary artery without angina pectoris: Secondary | ICD-10-CM | POA: Insufficient documentation

## 2019-01-02 DIAGNOSIS — Z955 Presence of coronary angioplasty implant and graft: Secondary | ICD-10-CM | POA: Insufficient documentation

## 2019-01-02 DIAGNOSIS — R079 Chest pain, unspecified: Secondary | ICD-10-CM | POA: Diagnosis not present

## 2019-01-02 LAB — NM MYOCAR MULTI W/SPECT W/WALL MOTION / EF
LV dias vol: 25 mL (ref 46–106)
LVSYSVOL: 9 mL
Peak HR: 100 {beats}/min
Percent HR: 72 %
Rest HR: 71 {beats}/min
TID: 0.75

## 2019-01-02 MED ORDER — TECHNETIUM TC 99M TETROFOSMIN IV KIT
32.6900 | PACK | Freq: Once | INTRAVENOUS | Status: AC | PRN
  Administered 2019-01-02: 32.69 via INTRAVENOUS

## 2019-01-02 MED ORDER — TECHNETIUM TC 99M TETROFOSMIN IV KIT
10.2320 | PACK | Freq: Once | INTRAVENOUS | Status: AC | PRN
  Administered 2019-01-02: 10.232 via INTRAVENOUS

## 2019-01-02 MED ORDER — REGADENOSON 0.4 MG/5ML IV SOLN
0.4000 mg | Freq: Once | INTRAVENOUS | Status: AC
  Administered 2019-01-02: 0.4 mg via INTRAVENOUS

## 2019-01-07 ENCOUNTER — Ambulatory Visit (INDEPENDENT_AMBULATORY_CARE_PROVIDER_SITE_OTHER): Payer: Medicare Other

## 2019-01-07 VITALS — BP 122/62 | HR 70 | Temp 98.8°F | Resp 15 | Ht 60.0 in | Wt 119.8 lb

## 2019-01-07 DIAGNOSIS — Z0000 Encounter for general adult medical examination without abnormal findings: Secondary | ICD-10-CM | POA: Diagnosis not present

## 2019-01-07 NOTE — Progress Notes (Signed)
Subjective:   Tanya Cole is a 85 y.o. female who presents for Medicare Annual (Subsequent) preventive examination.  Review of Systems:  No ROS.  Medicare Wellness Visit. Additional risk factors are reflected in the social history. Cardiac Risk Factors include: advanced age (>86men, >70 women);hypertension;diabetes mellitus     Objective:     Vitals: BP 122/62 (BP Location: Left Arm, Patient Position: Sitting, Cuff Size: Normal)   Pulse 70   Temp 98.8 F (37.1 C) (Oral)   Resp 15   Ht 5' (1.524 m)   Wt 119 lb 12.8 oz (54.3 kg)   SpO2 96%   BMI 23.40 kg/m   Body mass index is 23.4 kg/m.  Advanced Directives 01/07/2019 12/14/2018 12/14/2018 10/08/2018 10/07/2018 10/07/2018 01/03/2018  Does Patient Have a Medical Advance Directive? Yes Yes No - Yes Yes Yes  Type of Estate agent of [place];Living will Healthcare Power of Attorney - Living will;Healthcare Power of Attorney Out of facility DNR (pink MOST or yellow form) Living will Living will;Healthcare Power of Attorney  Does patient want to make changes to medical advance directive? No - Patient declined No - Patient declined - No - Patient declined No - Patient declined - No - Patient declined  Copy of Healthcare Power of Attorney in Chart? No - copy requested No - copy requested - - - - No - copy requested  Would patient like information on creating a medical advance directive? - - - - - - -  Pre-existing out of facility DNR order (yellow form or pink MOST form) - - - - Yellow form placed in chart (order not valid for inpatient use) - -    Tobacco Social History   Tobacco Use  Smoking Status Never Smoker  Smokeless Tobacco Never Used     Counseling given: Not Answered   Clinical Intake:  Pre-visit preparation completed: Yes  Pain : No/denies pain     Diabetes: Yes(Followed by pcp)  How often do you need to have someone help you when you read instructions, pamphlets, or other written  materials from your doctor or pharmacy?: 1 - Never  Interpreter Needed?: No     Past Medical History:  Diagnosis Date  . Aortic insufficiency    a. TTE 12/19: EF 55-60%, probable HK of the mid apical anterior septal myocardium, Gr1DD, mild AI, mildly dilated LA  . Asthma   . CAD (coronary artery disease)    a. NSTEMI 12/19; b. LHC 10/08/18: LM minimal luminal irregs, mLAD-1 95% s/p PCI/DES, mLAD-2 60%, LCx mild diffuse disease throughout, RCA minimal luminal irregs  . Diabetes mellitus (HCC)   . Hypercholesterolemia   . Hypertension   . Osteopenia   . Polymyalgia rheumatica syndrome (HCC)   . Reactive airway disease    Past Surgical History:  Procedure Laterality Date  . ABDOMINAL HYSTERECTOMY  1981   prolapse and bleeding, ovaries not removed  . BREAST EXCISIONAL BIOPSY Right   . CORONARY STENT INTERVENTION N/A 10/08/2018   Procedure: CORONARY STENT INTERVENTION;  Surgeon: Iran Ouch, MD;  Location: [facility];  Service: Cardiovascular;  Laterality: N/A;  . LEFT HEART CATH AND CORONARY ANGIOGRAPHY N/A 10/08/2018   Procedure: LEFT HEART CATH AND CORONARY ANGIOGRAPHY;  Surgeon: Iran Ouch, MD;  Location: [facility];  Service: Cardiovascular;  Laterality: N/A;  . UMBILICAL HERNIA REPAIR  7/94   Family History  Problem Relation Age of Onset  . Heart attack Father   . Arthritis  Mother   . Heart disease Mother   . Throat cancer Sister   . Parkinson's disease Sister   . COPD Brother    Social History   Socioeconomic History  . Marital status: Widowed    Spouse name: Not on file  . Number of children: 3  . Years of education: Not on file  . Highest education level: Not on file  Occupational History  . Not on file  Social Needs  . Financial resource strain: Not hard at all  . Food insecurity:    Worry: Never true    Inability: Never true  . Transportation needs:    Medical: No    Non-medical: No  Tobacco Use  . Smoking status: Never  Smoker  . Smokeless tobacco: Never Used  Substance and Sexual Activity  . Alcohol use: No    Alcohol/week: 0.0 standard drinks  . Drug use: No  . Sexual activity: Never  Lifestyle  . Physical activity:    Days per week: 2 days    Minutes per session: 10 min  . Stress: Not at all  Relationships  . Social connections:    Talks on phone: Not on file    Gets together: Not on file    Attends religious service: Not on file    Active member of club or organization: Not on file    Attends meetings of clubs or organizations: Not on file    Relationship status: Not on file  Other Topics Concern  . Not on file  Social History Narrative  . Not on file    Outpatient Encounter Medications as of 01/07/2019  Medication Sig  . ADVAIR DISKUS 250-50 MCG/DOSE AEPB INHALE ONE PUFF BY MOUTH EVERY 12 HOURS. RINSE MOUTH AFTER EACH USE  . alendronate (FOSAMAX) 70 MG tablet 70 mg once a week.   Marland Kitchen aspirin 81 MG tablet Take 81 mg by mouth daily.  Marland Kitchen atorvastatin (LIPITOR) 40 MG tablet Take 1 tablet (40 mg total) by mouth daily at 6 PM.  . clopidogrel (PLAVIX) 75 MG tablet Take 1 tablet (75 mg total) by mouth daily with breakfast.  . cyanocobalamin (,VITAMIN B-12,) 1000 MCG/ML injection Inject into the muscle.  . fluticasone (FLONASE) 50 MCG/ACT nasal spray USE 2 SPRAY(S) IN EACH NOSTRIL ONCE DAILY (Patient taking differently: Place 1 spray into both nostrils daily. )  . isosorbide mononitrate (IMDUR) 30 MG 24 hr tablet Take 1 tablet (30 mg total) by mouth daily.  Marland Kitchen lisinopril (PRINIVIL,ZESTRIL) 10 MG tablet Take 1 tablet (10 mg total) by mouth daily.  . metFORMIN (GLUCOPHAGE) 500 MG tablet TAKE 1 TABLET BY MOUTH TWICE DAILY WITH A MEAL  . metoprolol tartrate (LOPRESSOR) 25 MG tablet Take 1 tablet (25 mg total) by mouth 2 (two) times daily.  . Multiple Vitamins-Minerals (VISION FORMULA/LUTEIN PO) Take by mouth daily.  . nitroGLYCERIN (NITROSTAT) 0.4 MG SL tablet Place 1 tablet (0.4 mg total) under the tongue  every 5 (five) minutes as needed for chest pain.  Marland Kitchen PROAIR HFA 108 (90 Base) MCG/ACT inhaler Inhale 2 puffs into the lungs 4 (four) times daily as needed.  . Vitamin D, Ergocalciferol, (DRISDOL) 50000 units CAPS capsule Take 50,000 Units by mouth once a week.   No facility-administered encounter medications on file as of 01/07/2019.     Activities of Daily Living In your present state of health, do you have any difficulty performing the following activities: 01/07/2019 12/14/2018  Hearing? N N  Vision? N N  Difficulty concentrating  or making decisions? Y N  Comment Notes short term memory changes.  -  Walking or climbing stairs? N N  Comment Paces herself. -  Dressing or bathing? N N  Doing errands, shopping? Y N  Preparing Food and eating ? N -  Using the Toilet? N -  In the past six months, have you accidently leaked urine? Y -  Comment Managed with daily pad.  -  Do you have problems with loss of bowel control? N -  Managing your Medications? N -  Managing your Finances? N -  Housekeeping or managing your Housekeeping? N -  Some recent data might be hidden    Patient Care Team: Dale [place], MD as PCP - General (Internal Medicine) Iran Ouch, MD as PCP - Cardiology (Cardiology)    Assessment:   This is a routine wellness examination for [name].  Health Screenings  Mammogram -01/24/18 Colonoscopy -03/28/14 Glaucoma -none Hearing  -demonstrates normal hearing during conversation Hemoglobin A1C -12/14/18 Cholesterol -10/08/18  Social  Alcohol intake -no Smoking history- never Smokers in home? none Illicit drug use? none Exercise -walking Diet -regular Sexually Active -never  Safety  Patient feels safe at home. Life alert in place. Patient does have smoke detectors at home  Patient does wear sunscreen or protective clothing when in direct sunlight  Patient does wear seat belt when riding with others. She does not drive.   Activities of Daily Living Patient  can do their own household chores. Denies needing assistance with: feeding themselves, getting from bed to chair, getting to the toilet, bathing/showering, dressing, managing money, climbing flight of stairs, or preparing meals.   Depression Screen Patient denies losing interest in daily life, feeling hopeless, or crying easily over simple problems.   Fall Screen Patient denies being afraid of falling or falling in the last year.   Memory Screen Patient denies problems with short term memory. She does not desire further intervention at this time.   Patient is alert, normal appearance, oriented to person/place/and time. Incorrectly identified the president of the [place]. Difficulty with recall of 12 months in the year.  Correctly performed counting backwards 20-0.   Patient displays appropriate judgement and can read correct time from watch face.   Immunizations The following Immunizations are up to date:  pneumonia.  Discussed shingles and tetanus.    Other Providers Patient Care Team: Dale [place], MD as PCP - General (Internal Medicine) Iran Ouch, MD as PCP - Cardiology (Cardiology)  Exercise Activities and Dietary recommendations Current Exercise Habits: Home exercise routine, Time (Minutes): 10, Frequency (Times/Week): 2, Weekly Exercise (Minutes/Week): 20  Goals      Patient Stated   . Increase physical activity (pt-stated)     Walk outside more for exercise       Fall Risk Fall Risk  01/07/2019 01/03/2018 12/26/2016 10/05/2016 12/25/2015  Falls in the past year? 0 No No Yes No  Number falls in past yr: - - - 1 -  Injury with Fall? - - - No -  Risk for fall due to : - - - - -   Depression Screen PHQ 2/9 Scores 01/07/2019 01/03/2018 12/26/2016 12/25/2015  PHQ - 2 Score 0 0 0 0     Cognitive Function MMSE - Mini Mental State Exam 12/25/2015  Orientation to time 5  Orientation to Place 5  Registration 3  Attention/ Calculation 5  Recall 3  Language- name 2 objects  2  Language- repeat 1  Language- follow 3  step command 3  Language- read & follow direction 1  Write a sentence 1  Copy design 1  Total score 30     6CIT Screen 01/07/2019 01/03/2018 12/26/2016  What Year? 0 points 0 points 0 points  What month? 0 points 0 points 0 points  What time? 0 points 0 points 0 points  Count back from 20 2 points 0 points 0 points  Months in reverse 4 points 0 points 0 points  Repeat phrase - 0 points 0 points  Total Score - 0 0    Immunization History  Administered Date(s) Administered  . Pneumococcal Conjugate-13 07/22/2014  . Pneumococcal Polysaccharide-23 01/03/2018    Screening Tests Health Maintenance  Topic Date Due  . TETANUS/TDAP  06[birth year]16/1952  . FOOT EXAM  02/16/2018  . OPHTHALMOLOGY EXAM  03/20/2018  . MAMMOGRAM  01/25/2019  . HEMOGLOBIN A1C  06/14/2019  . DEXA SCAN  Completed  . PNA vac Low Risk Adult  Completed  . INFLUENZA VACCINE  Discontinued      Plan:    End of life planning; Advance aging; Advanced directives discussed. Copy of current HCPOA/Living Will requested.    I have personally reviewed and noted the following in the patient's chart:   . Medical and social history . Use of alcohol, tobacco or illicit drugs  . Current medications and supplements . Functional ability and status . Nutritional status . Physical activity . Advanced directives . List of other physicians . Hospitalizations, surgeries, and ER visits in previous 12 months . Vitals . Screenings to include cognitive, depression, and falls . Referrals and appointments  In addition, I have reviewed and discussed with patient certain preventive protocols, quality metrics, and best practice recommendations. A written personalized care plan for preventive services as well as general preventive health recommendations were provided to patient.     Ashok PallOBrien-Blaney, Denisa L, LPN  5/6/21303/07/2019   Agree with plan. Rennie PlowmanMargaret Arnett, NP

## 2019-01-07 NOTE — Patient Instructions (Addendum)
  Tanya Cole , Thank you for taking time to come for your Medicare Wellness Visit. I appreciate your ongoing commitment to your health goals. Please review the following plan we discussed and let me know if I can assist you in the future.   These are the goals we discussed: Goals      Patient Stated   . Increase physical activity (pt-stated)     Walk outside more for exercise       This is a list of the screening recommended for you and due dates:  Health Maintenance  Topic Date Due  . Tetanus Vaccine  06/16/1954  . Complete foot exam   02/16/2018  . Eye exam for diabetics  03/20/2018  . Mammogram  01/25/2019  . Hemoglobin A1C  06/14/2019  . DEXA scan (bone density measurement)  Completed  . Pneumonia vaccines  Completed  . Flu Shot  Discontinued

## 2019-01-17 ENCOUNTER — Other Ambulatory Visit: Payer: Self-pay

## 2019-01-17 ENCOUNTER — Ambulatory Visit (INDEPENDENT_AMBULATORY_CARE_PROVIDER_SITE_OTHER): Payer: Medicare Other

## 2019-01-17 DIAGNOSIS — E538 Deficiency of other specified B group vitamins: Secondary | ICD-10-CM | POA: Diagnosis not present

## 2019-01-17 MED ORDER — CYANOCOBALAMIN 1000 MCG/ML IJ SOLN
1000.0000 ug | Freq: Once | INTRAMUSCULAR | Status: AC
  Administered 2019-01-17: 1000 ug via INTRAMUSCULAR

## 2019-01-17 NOTE — Progress Notes (Addendum)
[place] Roley presents today for injection per MD orders. B12 injection administered IM in right Upper Arm. Administration without incident. Patient tolerated well.  [name],cma  Reviewed.  Dr Lorin Picket

## 2019-01-22 ENCOUNTER — Telehealth: Payer: Self-pay | Admitting: *Deleted

## 2019-01-22 NOTE — Telephone Encounter (Signed)
TELEPHONE CALL NOTE  Tanya Cole has been deemed a candidate for a follow-up tele-health visit to limit community exposure during the Covid-19 pandemic. I spoke with the patient via phone to ensure availability of phone/video source, confirm preferred email & phone number, discuss instructions and expectations, and review consent.   I reminded Tanya Cole to be prepared with any vital sign and/or heart rhythm information that could potentially be obtained via home monitoring, at the time of her visit.  Finally, I reminded Tanya Cole to expect an e-mail containing a link for their video-based visit approximately 15 minutes before her visit, or alternatively, a phone call at the time of her visit if her visit is planned to be a phone encounter.  Did the patient verbally consent to treatment as below? Yes  Patient unable to check weight and vital signs at home.  Appointment made for 01/23/2019 at 10:30 for e-visit  Sandi Mariscal, RN 01/22/2019 11:18 AM  CONSENT FOR TELE-HEALTH VISIT - PLEASE REVIEW  I hereby voluntarily request, consent and authorize [facility] and its employed or contracted physicians, physician assistants, nurse practitioners or other licensed health care professionals (the Practitioner), to provide me with telemedicine health care services (the "Services") as deemed necessary by the treating Practitioner. I acknowledge and consent to receive the Services by the Practitioner via telemedicine. I understand that the telemedicine visit will involve communicating with the Practitioner through live audiovisual communication technology and the disclosure of certain medical information by electronic transmission. I acknowledge that I have been given the opportunity to request an in-person assessment or other available alternative prior to the telemedicine visit and am voluntarily participating in the telemedicine visit.  I understand that I have the right to  withhold or withdraw my consent to the use of telemedicine in the course of my care at any time, without affecting my right to future care or treatment, and that the Practitioner or I may terminate the telemedicine visit at any time. I understand that I have the right to inspect all information obtained and/or recorded in the course of the telemedicine visit and may receive copies of available information for a reasonable fee.  I understand that some of the potential risks of receiving the Services via telemedicine include:  Marland Kitchen Delay or interruption in medical evaluation due to technological equipment failure or disruption; . Information transmitted may not be sufficient (e.g. poor resolution of images) to allow for appropriate medical decision making by the Practitioner; and/or  . In rare instances, security protocols could fail, causing a breach of personal health information.  Furthermore, I acknowledge that it is my responsibility to provide information about my medical history, conditions and care that is complete and accurate to the best of my ability. I acknowledge that Practitioner's advice, recommendations, and/or decision may be based on factors not within their control, such as incomplete or inaccurate data provided by me or distortions of diagnostic images or specimens that may result from electronic transmissions. I understand that the practice of medicine is not an exact science and that Practitioner makes no warranties or guarantees regarding treatment outcomes. I acknowledge that I will receive a copy of this consent concurrently upon execution via email to the email address I last provided but may also request a printed copy by calling the office of [facility].    I understand that my insurance will be billed for this visit.   I have read or had this consent read to  me. . I understand the contents of this consent, which adequately explains the benefits and risks of the Services being  provided via telemedicine.  . I have been provided ample opportunity to ask questions regarding this consent and the Services and have had my questions answered to my satisfaction. . I give my informed consent for the services to be provided through the use of telemedicine in my medical care  By participating in this telemedicine visit I agree to the above.

## 2019-01-23 ENCOUNTER — Other Ambulatory Visit: Payer: Self-pay

## 2019-01-23 ENCOUNTER — Telehealth (INDEPENDENT_AMBULATORY_CARE_PROVIDER_SITE_OTHER): Payer: Medicare Other | Admitting: Cardiovascular Disease

## 2019-01-23 DIAGNOSIS — I10 Essential (primary) hypertension: Secondary | ICD-10-CM

## 2019-01-23 DIAGNOSIS — I2510 Atherosclerotic heart disease of native coronary artery without angina pectoris: Secondary | ICD-10-CM | POA: Diagnosis not present

## 2019-01-23 NOTE — Progress Notes (Signed)
Virtual Visit via Telephone Note    Evaluation Performed:  Follow-up visit  This visit type was conducted due to national recommendations for restrictions regarding the COVID-19 Pandemic (e.g. social distancing).  This format is felt to be most appropriate for this patient at this time.  All issues noted in this document were discussed and addressed.  No physical exam was performed (except for noted visual exam findings with Video Visits).  Please refer to the patient's chart (MyChart message for video visits and phone note for telephone visits) for the patient's consent to telehealth for [facility].  Date:  01/23/2019   ID:  Tanya Cole, Tanya Cole 07/11/1933, MRN [id number]  Patient Location:  [address] [place] [address]   Provider location:   [facility] [place]  PCP:  Dale Assumption, MD  Cardiologist:  Lorine Bears, MD  Electrophysiologist:  None   Chief Complaint: Follow-up regarding chest pain  History of Present Illness:    Tanya Cole is a 86 y.o. female who presents via Web designer for a telehealth visit today.   She has known history of coronary artery disease, diabetes, hypertension and hyperlipidemia.  She had non-ST elevation myocardial infarction in December 2019.  Cardiac catheterization showed 95% mid LAD stenosis and 60% distal LAD stenosis.  The mid LAD stenosis was treated successfully with PCI and drug-eluting stent placement.  She was hospitalized briefly in early February with chest pain and negative troponin.  She underwent Lexiscan Myoview on March 4 which showed no evidence of ischemia with normal ejection fraction.  She is feeling well with no recurrent chest pain no palpitations.  The patient does not symptoms concerning for COVID-19 infection (fever, chills, cough, or new SHORTNESS OF BREATH).    Prior CV studies:   The following studies were reviewed today:    Past Medical History:  Diagnosis Date  .  Aortic insufficiency    a. TTE 12/19: EF 55-60%, probable HK of the mid apical anterior septal myocardium, Gr1DD, mild AI, mildly dilated LA  . Asthma   . CAD (coronary artery disease)    a. NSTEMI 12/19; b. LHC 10/08/18: LM minimal luminal irregs, mLAD-1 95% s/p PCI/DES, mLAD-2 60%, LCx mild diffuse disease throughout, RCA minimal luminal irregs  . Diabetes mellitus (HCC)   . Hypercholesterolemia   . Hypertension   . Osteopenia   . Polymyalgia rheumatica syndrome (HCC)   . Reactive airway disease    Past Surgical History:  Procedure Laterality Date  . ABDOMINAL HYSTERECTOMY  1981   prolapse and bleeding, ovaries not removed  . BREAST EXCISIONAL BIOPSY Right   . CORONARY STENT INTERVENTION N/A 10/08/2018   Procedure: CORONARY STENT INTERVENTION;  Surgeon: Iran Ouch, MD;  Location: [facility] INVASIVE CV LAB;  Service: Cardiovascular;  Laterality: N/A;  . LEFT HEART CATH AND CORONARY ANGIOGRAPHY N/A 10/08/2018   Procedure: LEFT HEART CATH AND CORONARY ANGIOGRAPHY;  Surgeon: Iran Ouch, MD;  Location: [facility] INVASIVE CV LAB;  Service: Cardiovascular;  Laterality: N/A;  . UMBILICAL HERNIA REPAIR  7/94     No outpatient medications have been marked as taking for the 01/23/19 encounter (Appointment) with Iran Ouch, MD.     Allergies:   Tramadol   Social History   Tobacco Use  . Smoking status: Never Smoker  . Smokeless tobacco: Never Used  Substance Use Topics  . Alcohol use: No    Alcohol/week: 0.0 standard drinks  . Drug use: No     Family  Hx: The patient's family history includes Arthritis in her mother; COPD in her brother; Heart attack in her father; Heart disease in her mother; Parkinson's disease in her sister; Throat cancer in her sister.  ROS:   Please see the history of present illness.     All other systems reviewed and are negative.   Labs/Other Tests and Data Reviewed:    Recent Labs: 10/07/2018: ALT 12 10/08/2018: TSH 1.141 10/18/2018: Magnesium  1.9 12/14/2018: BUN 15; Creatinine, Ser 0.77; Hemoglobin 13.8; Platelets 284; Potassium 4.1; Sodium 139   Recent Lipid Panel Lab Results  Component Value Date/Time   CHOL 172 10/08/2018 04:56 PM   TRIG 349 (H) 10/08/2018 04:56 PM   HDL 49 10/08/2018 04:56 PM   CHOLHDL 3.5 10/08/2018 04:56 PM   LDLCALC 53 10/08/2018 04:56 PM   LDLDIRECT 85.0 06/15/2017 08:15 AM    Wt Readings from Last 3 Encounters:  01/07/19 119 lb 12.8 oz (54.3 kg)  12/25/18 120 lb 3.2 oz (54.5 kg)  12/18/18 122 lb (55.3 kg)     Exam:    Vital Signs:  There were no vitals taken for this visit.    ASSESSMENT & PLAN:    1.  Coronary artery disease involving native coronary arteries without angina: No recurrent chest pain.  Recent nuclear stress test showed no evidence of ischemia with normal ejection fraction.  She is taking her medications regularly with no reported side effects.  Continue dual antiplatelet therapy.  2.  Essential hypertension: Currently on lisinopril, Imdur and metoprolol.  3.  Hyperlipidemia: Continue atorvastatin.  LDL was 53.  COVID-19 Education: The signs and symptoms of COVID-19 were discussed with the patient and how to seek care for testing (follow up with PCP or arrange E-visit).  The importance of social distancing was discussed today.  Patient Risk:   After full review of this patients clinical status, I feel that they are at least moderate risk at this time.  Time:   Today, I have spent 18 minutes with the patient with telehealth technology discussing .     Medication Adjustments/Labs and Tests Ordered: Current medicines are reviewed at length with the patient today.  Concerns regarding medicines are outlined above.  Tests Ordered: No orders of the defined types were placed in this encounter.  Medication Changes: No orders of the defined types were placed in this encounter.   Disposition:  in 3 month(s)  Signed, Lorine Bears, MD  01/23/2019 10:37 AM    [facility]

## 2019-01-23 NOTE — Patient Instructions (Signed)
Medication Instructions: Continue same medications  Labwork: None  Procedures/Testing: None  Follow-Up: E- visit in 3 months  Any Additional Special Instructions Will Be Listed Below (If Applicable).     If you need a refill on your cardiac medications before your next appointment, please call your pharmacy.

## 2019-01-24 ENCOUNTER — Telehealth: Payer: Self-pay | Admitting: Cardiovascular Disease

## 2019-01-25 NOTE — Progress Notes (Signed)
3 m fu Evisit recall entered.  Will call when schedule available.

## 2019-02-07 ENCOUNTER — Other Ambulatory Visit: Payer: Self-pay

## 2019-02-12 ENCOUNTER — Encounter: Payer: Self-pay | Admitting: Internal Medicine

## 2019-02-12 ENCOUNTER — Ambulatory Visit (INDEPENDENT_AMBULATORY_CARE_PROVIDER_SITE_OTHER): Payer: Medicare Other | Admitting: Internal Medicine

## 2019-02-12 DIAGNOSIS — E7800 Pure hypercholesterolemia, unspecified: Secondary | ICD-10-CM

## 2019-02-12 DIAGNOSIS — I10 Essential (primary) hypertension: Secondary | ICD-10-CM

## 2019-02-12 DIAGNOSIS — E1159 Type 2 diabetes mellitus with other circulatory complications: Secondary | ICD-10-CM

## 2019-02-12 DIAGNOSIS — I2510 Atherosclerotic heart disease of native coronary artery without angina pectoris: Secondary | ICD-10-CM | POA: Diagnosis not present

## 2019-02-12 DIAGNOSIS — M810 Age-related osteoporosis without current pathological fracture: Secondary | ICD-10-CM | POA: Diagnosis not present

## 2019-02-12 NOTE — Assessment & Plan Note (Signed)
Sugars as outlined.  Low carb diet and exercise.  Follow met b and a1c.

## 2019-02-12 NOTE — Assessment & Plan Note (Signed)
S/p PCI LAD.  Recent lexiscan - negative for ischemia.  Virtual telephone visit with Dr Kirke Corin 01/23/19.  No changes made.  Stable.  Continue risk factor modification.

## 2019-02-12 NOTE — Assessment & Plan Note (Signed)
On lipitor.  Low cholesterol diet and exercise.  Follow lipid panel and liver function tests.   

## 2019-02-12 NOTE — Progress Notes (Signed)
[name], female   DOB: 28-Sep-1933, 85 y.o.   MRN: [id number] Telephone Visit:  Note  This visit type was conducted due to national recommendations for restrictions regarding the COVID-19 pandemic (e.g. social distancing).  This format is felt to be most appropriate for this patient at this time.  All issues noted in this document were discussed and addressed.   I connected with [name] on 02/12/19 at  2:00 PM EDT by  telephone and verified that I am speaking with the correct person using two identifiers. Location patient: home Location provider: work Persons participating in the telephone visit: patient, provider  I discussed the limitations, risks, security and privacy concerns of performing an evaluation and management service by telephone and the availability of in person appointments. The patient expressed understanding and agreed to proceed.   Reason for visit: scheduled follow up.   HPI: This is a scheduled follow up.  She reports she is doing well.  Feels breathing is stable.  No chest pain.  Had a virtual telephone visit with Dr Fletcher Anon 01/23/19.  S/p Lexiscan 01/02/19 - negative ischemia.  No further pain.  Doing well.  Taking her medication.  States her sugars are doing well.  Blood sugars in am 120-122.  Does not check in pm.  Last a1c 6.4.  No abdominal pain.  Bowels moving.  No urine change.  Increased stress today.  Daughter was just admitted to the hospital early this am.  Daughter-n-law with her.  States she is handling things relatively well.  Blood pressure today 140/55. Feels may be up secondary to increased stress.  Discussed spot checking her pressure.  No significant allergy problems.  Takes an otc allergy pill.  Controls her symptoms.     ROS: See pertinent positives and negatives per HPI.  Past Medical History:  Diagnosis Date  . Aortic insufficiency    a. TTE 12/19: EF 55-60%, probable HK of the mid apical anterior septal myocardium, Gr1DD, mild  AI, mildly dilated LA  . Asthma   . CAD (coronary artery disease)    a. NSTEMI 12/19; b. LHC 10/08/18: LM minimal luminal irregs, mLAD-1 95% s/p PCI/DES, mLAD-2 60%, LCx mild diffuse disease throughout, RCA minimal luminal irregs  . Diabetes mellitus ([place])   . Hypercholesterolemia   . Hypertension   . Osteopenia   . Polymyalgia rheumatica syndrome ([place])   . Reactive airway disease     Past Surgical History:  Procedure Laterality Date  . ABDOMINAL HYSTERECTOMY  1981   prolapse and bleeding, ovaries not removed  . BREAST EXCISIONAL BIOPSY Right   . CORONARY STENT INTERVENTION N/A 10/08/2018   Procedure: CORONARY STENT INTERVENTION;  Surgeon: Wellington Hampshire, MD;  Location: [facility] CV LAB;  Service: Cardiovascular;  Laterality: N/A;  . LEFT HEART CATH AND CORONARY ANGIOGRAPHY N/A 10/08/2018   Procedure: LEFT HEART CATH AND CORONARY ANGIOGRAPHY;  Surgeon: Wellington Hampshire, MD;  Location: [facility] CV LAB;  Service: Cardiovascular;  Laterality: N/A;  . UMBILICAL HERNIA REPAIR  7/94    Family History  Problem Relation Age of Onset  . Heart attack Father   . Arthritis Mother   . Heart disease Mother   . Throat cancer Sister   . Parkinson's disease Sister   . COPD Brother     SOCIAL HX: reviewed.    Current Outpatient Medications:  .  ADVAIR DISKUS 250-50 MCG/DOSE AEPB, INHALE ONE PUFF BY MOUTH EVERY 12 HOURS. RINSE MOUTH AFTER EACH USE, Disp:  60 each, Rfl: 5 .  alendronate (FOSAMAX) 70 MG tablet, 70 mg once a week. , Disp: , Rfl:  .  aspirin 81 MG tablet, Take 81 mg by mouth daily., Disp: , Rfl:  .  atorvastatin (LIPITOR) 40 MG tablet, Take 1 tablet (40 mg total) by mouth daily at 6 PM., Disp: 90 tablet, Rfl: 3 .  clopidogrel (PLAVIX) 75 MG tablet, Take 1 tablet (75 mg total) by mouth daily with breakfast., Disp: 90 tablet, Rfl: 3 .  cyanocobalamin (,VITAMIN B-12,) 1000 MCG/ML injection, Inject into the muscle., Disp: , Rfl:  .  fluticasone (FLONASE) 50 MCG/ACT nasal  spray, USE 2 SPRAY(S) IN EACH NOSTRIL ONCE DAILY (Patient taking differently: Place 1 spray into both nostrils daily. ), Disp: 16 g, Rfl: 3 .  isosorbide mononitrate (IMDUR) 30 MG 24 hr tablet, Take 1 tablet (30 mg total) by mouth daily., Disp: 30 tablet, Rfl: 0 .  lisinopril (PRINIVIL,ZESTRIL) 10 MG tablet, Take 1 tablet (10 mg total) by mouth daily., Disp: 30 tablet, Rfl: 0 .  metFORMIN (GLUCOPHAGE) 500 MG tablet, TAKE 1 TABLET BY MOUTH TWICE DAILY WITH A MEAL, Disp: 180 tablet, Rfl: 1 .  metoprolol tartrate (LOPRESSOR) 25 MG tablet, Take 1 tablet (25 mg total) by mouth 2 (two) times daily., Disp: 90 tablet, Rfl: 3 .  Multiple Vitamins-Minerals (VISION FORMULA/LUTEIN PO), Take by mouth daily., Disp: , Rfl:  .  nitroGLYCERIN (NITROSTAT) 0.4 MG SL tablet, Place 1 tablet (0.4 mg total) under the tongue every 5 (five) minutes as needed for chest pain., Disp: 30 tablet, Rfl: 12 .  PROAIR HFA 108 (90 Base) MCG/ACT inhaler, Inhale 2 puffs into the lungs 4 (four) times daily as needed., Disp: , Rfl: 2 .  Vitamin D, Ergocalciferol, (DRISDOL) 50000 units CAPS capsule, Take 50,000 Units by mouth once a week., Disp: , Rfl: 3  EXAM:  VITALS per patient if applicable: blood pressure checked by her daughter-n-law Brayton Layman) - 140/55  GENERAL: Alert.  Answers questions appropriately.  Sounds to be in no acute distress.    PSYCH/NEURO: pleasant and cooperative, no obvious depression or anxiety, speech and thought processing grossly intact  ASSESSMENT AND PLAN:  Discussed the following assessment and plan:  Coronary artery disease involving native coronary artery of native heart without angina pectoris  Diabetes mellitus with cardiac complication (HCC)  Hypercholesterolemia  Essential hypertension  Osteoporosis without current pathological fracture, unspecified osteoporosis type  CAD (coronary artery disease) S/p PCI LAD.  Recent lexiscan - negative for ischemia.  Virtual telephone visit with Dr Fletcher Anon  01/23/19.  No changes made.  Stable.  Continue risk factor modification.    Diabetes mellitus with cardiac complication (HCC) Sugars as outlined.  Low carb diet and exercise.  Follow met b and a1c.    Hypercholesterolemia On lipitor.  Low cholesterol diet and exercise.  Follow lipid panel and liver function tests.    Hypertension Blood pressure as outlined.  Elevated today.  Daughter just admitted to the hospital.  Will have her spot check her pressure.  Continue current medications.  Follow.  Follow metabolic panel.    Osteoporosis On fosamax and tolerating.  Follow.      I discussed the assessment and treatment plan with the patient. The patient was provided an opportunity to ask questions and all were answered. The patient agreed with the plan and demonstrated an understanding of the instructions.   The patient was advised to call back or seek an in-person evaluation if the symptoms worsen or if  the condition fails to improve as anticipated.  I provided 15 minutes of non-face-to-face time during this encounter.   Einar Pheasant, MD

## 2019-02-12 NOTE — Assessment & Plan Note (Signed)
On fosamax and tolerating.  Follow.  

## 2019-02-12 NOTE — Assessment & Plan Note (Signed)
Blood pressure as outlined.  Elevated today.  Daughter just admitted to the hospital.  Will have her spot check her pressure.  Continue current medications.  Follow.  Follow metabolic panel.

## 2019-02-13 ENCOUNTER — Telehealth: Payer: Self-pay

## 2019-02-13 NOTE — Telephone Encounter (Signed)
Copied from CRM [id number]. Topic: General - Other >> Feb 12, 2019  4:09 PM Lorrine Kin, NT wrote: Reason for CRM: Patient calling in regards to a phone call she received today about a no show for an appointment. She had a lab scheduled for 02/07/2019, states that she had spoke to Dr Lorin Picket and was advised not to come to that appointment. Please advise.

## 2019-02-14 NOTE — Telephone Encounter (Signed)
I spoke with the patient to inform her that she was not charged a no show fee for her missed lab visit.

## 2019-02-19 ENCOUNTER — Other Ambulatory Visit: Payer: Self-pay

## 2019-02-19 ENCOUNTER — Ambulatory Visit (INDEPENDENT_AMBULATORY_CARE_PROVIDER_SITE_OTHER): Payer: Medicare Other

## 2019-02-19 DIAGNOSIS — E538 Deficiency of other specified B group vitamins: Secondary | ICD-10-CM | POA: Diagnosis not present

## 2019-02-19 MED ORDER — CYANOCOBALAMIN 1000 MCG/ML IJ SOLN
1000.0000 ug | Freq: Once | INTRAMUSCULAR | Status: AC
  Administered 2019-02-19: 16:00:00 1000 ug via INTRAMUSCULAR

## 2019-02-19 NOTE — Progress Notes (Addendum)
Patient presented today for B12 injection.  Administered IM in the left deltoid.  Patient tolerated well.  Reviewed.  Dr Lorin Picket

## 2019-02-26 DIAGNOSIS — M810 Age-related osteoporosis without current pathological fracture: Secondary | ICD-10-CM | POA: Diagnosis not present

## 2019-02-26 DIAGNOSIS — E559 Vitamin D deficiency, unspecified: Secondary | ICD-10-CM | POA: Diagnosis not present

## 2019-03-13 DIAGNOSIS — M810 Age-related osteoporosis without current pathological fracture: Secondary | ICD-10-CM | POA: Diagnosis not present

## 2019-03-13 DIAGNOSIS — E559 Vitamin D deficiency, unspecified: Secondary | ICD-10-CM | POA: Diagnosis not present

## 2019-03-26 ENCOUNTER — Ambulatory Visit (INDEPENDENT_AMBULATORY_CARE_PROVIDER_SITE_OTHER): Payer: Medicare Other | Admitting: *Deleted

## 2019-03-26 ENCOUNTER — Other Ambulatory Visit: Payer: Self-pay

## 2019-03-26 DIAGNOSIS — E538 Deficiency of other specified B group vitamins: Secondary | ICD-10-CM | POA: Diagnosis not present

## 2019-03-27 MED ORDER — CYANOCOBALAMIN 1000 MCG/ML IJ SOLN
1000.0000 ug | Freq: Once | INTRAMUSCULAR | Status: AC
  Administered 2019-03-26: 1000 ug via INTRAMUSCULAR

## 2019-03-27 NOTE — Progress Notes (Addendum)
Patient presented for B 12 injection to right deltoid, patient voiced no concerns nor showed any signs of distress during injection.  Reviewed.  Dr Scott 

## 2019-03-28 ENCOUNTER — Other Ambulatory Visit: Payer: Self-pay

## 2019-03-28 NOTE — Patient Outreach (Signed)
[facility]) Care Management  03/28/2019  [place] [employer] June 05, 1933 [id number]   Medication Adherence call to Tanya Cole Hippa Identifiers Verify spoke with patient she is due on Metformin 500 mg patient explain she is taking 1 tablet 2 times a day and has enough for 2 more weeks and will order when finished. Tanya Cole is showing past due under [facility] Ins.   Lillia Abed CPhT Pharmacy Technician [facility] Care Management Direct Dial [phone]  Fax [phone] [email or web address]

## 2019-04-01 ENCOUNTER — Other Ambulatory Visit: Payer: Self-pay

## 2019-04-01 MED ORDER — LISINOPRIL 10 MG PO TABS: 10.0000 mg | ORAL_TABLET | Freq: Every day | ORAL | 0 refills | Status: DC

## 2019-04-12 ENCOUNTER — Telehealth: Payer: Self-pay

## 2019-04-12 NOTE — Progress Notes (Signed)
Virtual Visit via Telephone Note   This visit type was conducted due to national recommendations for restrictions regarding the COVID-19 Pandemic (e.g. social distancing) in an effort to limit this patient's exposure and mitigate transmission in our community.  Due to her co-morbid illnesses, this patient is at least at moderate risk for complications without adequate follow up.  This format is felt to be most appropriate for this patient at this time.  The patient did not have access to video technology/had technical difficulties with video requiring transitioning to audio format only (telephone).  All issues noted in this document were discussed and addressed.  No physical exam could be performed with this format.  Please refer to the patient's chart for her  consent to telehealth for [facility].   Date:  04/15/2019   ID:  [place], [place] 1933/05/03, MRN [id number]  Patient Location: Home Provider Location: Office  PCP:  Dale Bloomfield, MD  Cardiologist:  Lorine Bears, MD  Electrophysiologist:  None   Evaluation Performed:  Follow-Up Visit  Chief Complaint:  Follow up  History of Present Illness:    Tanya Cole is a 85 y.o. female with history of CAD with a NSTEMI in 09/2018 s/p PCI/DES to the LAD, DM2, HTN, HLD, PMR, and osteopenia who presents for follow up of her CAD.   She was admitted in 09/2018 with a NSTEMI. LHC showed 95% mid LAD stenosis along with 60% distal LAD stenosis. She underwent successful PCI/DES to the mid LAD. Echo during that admission showed an EF of 55-60%, probably hypokinesis of the mid apical anterior septal myocardium, Gr1DD, mild AI, mildly dilated left atrium. She was most recently admitted in 12/2018 with chest pain and ruled out. She underwent Lexiscan Myoview on 01/02/2019 which showed no evidence of ischemia with a normal EF. She was seen by Dr. Kirke Corin in telemedicine on 01/23/2019 for follow up and was doing well.   She is seen in  telemedicine follow up today and is doing well from a cardiac perspective.  She denies any chest pain, shortness of breath, palpitations, dizziness, recently, or syncope.  No lower extreme swelling, abdominal distention, orthopnea, PND, early satiety.  No falls since she was last seen.  No BRBPR or melena.  Blood pressure is mildly elevated today with initial reading in the 170 systolic and recheck over the phone in the 150s systolic.  Patient indicates she was somewhat anxious for appointment today.  Otherwise, she has no issues or complaints.   Labs: 12/2018 - HGB 13.8, PLT 284, K+ 4.1, SCr 0.77 09/2018 - magnesium 1.9, TSH normal, LDL 53, AST/ALT normal, A1c 6.6  The patient does not have symptoms concerning for COVID-19 infection (fever, chills, cough, or new shortness of breath).    Past Medical History:  Diagnosis Date  . Aortic insufficiency    a. TTE 12/19: EF 55-60%, probable HK of the mid apical anterior septal myocardium, Gr1DD, mild AI, mildly dilated LA  . Asthma   . CAD (coronary artery disease)    a. NSTEMI 12/19; b. LHC 10/08/18: LM minimal luminal irregs, mLAD-1 95% s/p PCI/DES, mLAD-2 60%, LCx mild diffuse disease throughout, RCA minimal luminal irregs  . Diabetes mellitus (HCC)   . Hypercholesterolemia   . Hypertension   . Osteopenia   . Polymyalgia rheumatica syndrome (HCC)   . Reactive airway disease    Past Surgical History:  Procedure Laterality Date  . ABDOMINAL HYSTERECTOMY  1981   prolapse and bleeding, ovaries not removed  .  BREAST EXCISIONAL BIOPSY Right   . CORONARY STENT INTERVENTION N/A 10/08/2018   Procedure: CORONARY STENT INTERVENTION;  Surgeon: Iran OuchArida, Muhammad A, MD;  Location: [facility] INVASIVE CV LAB;  Service: Cardiovascular;  Laterality: N/A;  . LEFT HEART CATH AND CORONARY ANGIOGRAPHY N/A 10/08/2018   Procedure: LEFT HEART CATH AND CORONARY ANGIOGRAPHY;  Surgeon: Iran OuchArida, Muhammad A, MD;  Location: [facility] INVASIVE CV LAB;  Service: Cardiovascular;   Laterality: N/A;  . UMBILICAL HERNIA REPAIR  7/94     Current Meds  Medication Sig  . ADVAIR DISKUS 250-50 MCG/DOSE AEPB INHALE ONE PUFF BY MOUTH EVERY 12 HOURS. RINSE MOUTH AFTER EACH USE  . alendronate (FOSAMAX) 70 MG tablet 70 mg once a week.   Marland Kitchen. aspirin 81 MG tablet Take 81 mg by mouth daily.  Marland Kitchen. atorvastatin (LIPITOR) 40 MG tablet Take 1 tablet (40 mg total) by mouth daily at 6 PM.  . clopidogrel (PLAVIX) 75 MG tablet Take 1 tablet (75 mg total) by mouth daily with breakfast.  . cyanocobalamin (,VITAMIN B-12,) 1000 MCG/ML injection Inject into the muscle.  . fluticasone (FLONASE) 50 MCG/ACT nasal spray USE 2 SPRAY(S) IN EACH NOSTRIL ONCE DAILY (Patient taking differently: Place 1 spray into both nostrils daily. )  . isosorbide mononitrate (IMDUR) 30 MG 24 hr tablet Take 1 tablet (30 mg total) by mouth daily.  Marland Kitchen. lisinopril (ZESTRIL) 10 MG tablet Take 1 tablet (10 mg total) by mouth daily.  . metFORMIN (GLUCOPHAGE) 500 MG tablet TAKE 1 TABLET BY MOUTH TWICE DAILY WITH A MEAL  . metoprolol tartrate (LOPRESSOR) 25 MG tablet Take 1 tablet (25 mg total) by mouth 2 (two) times daily.  . Multiple Vitamins-Minerals (VISION FORMULA/LUTEIN PO) Take by mouth daily.  . nitroGLYCERIN (NITROSTAT) 0.4 MG SL tablet Place 1 tablet (0.4 mg total) under the tongue every 5 (five) minutes as needed for chest pain.  Marland Kitchen. PROAIR HFA 108 (90 Base) MCG/ACT inhaler Inhale 2 puffs into the lungs 4 (four) times daily as needed.  . Vitamin D, Ergocalciferol, (DRISDOL) 50000 units CAPS capsule Take 50,000 Units by mouth once a week.     Allergies:   Tramadol   Social History   Tobacco Use  . Smoking status: Never Smoker  . Smokeless tobacco: Never Used  Substance Use Topics  . Alcohol use: No    Alcohol/week: 0.0 standard drinks  . Drug use: No     Family Hx: The patient's family history includes Arthritis in her mother; COPD in her brother; Heart attack in her father; Heart disease in her mother; Parkinson's  disease in her sister; Throat cancer in her sister.  ROS:   Please see the history of present illness.     All other systems reviewed and are negative.   Prior CV studies:   The following studies were reviewed today:  LHC 09/2018:  A drug-eluting stent was successfully placed using a STENT RESOLUTE ONYX 2.5X15.  Mid LAD-1 lesion is 95% stenosed.  Post intervention, there is a 0% residual stenosis.  Mid LAD-2 lesion is 60% stenosed.  The left ventricular systolic function is normal.  LV end diastolic pressure is mildly elevated.  The left ventricular ejection fraction is 55-65% by visual estimate.   1.  Severe one-vessel coronary artery disease involving mid LAD. 2.  Normal LV systolic function and mildly elevated left ventricular end-diastolic pressure. 3.  Successful angioplasty and drug-eluting stent placement to the mid LAD.  Recommendations: Aggressive treatment of risk factors.  I switch simvastatin to atorvastatin. Dual  antiplatelet therapy for at least one year. Possible discharge home tomorrow. ___________  2D Echo 09/2018: - Left ventricle: The cavity size was normal. Wall thickness was   normal. Systolic function was normal. The estimated ejection   fraction was in the range of 55% to 60%. Probable hypokinesis of   the mid-apicalanteroseptal myocardium. Doppler parameters are   consistent with abnormal left ventricular relaxation (grade 1   diastolic dysfunction). - Aortic valve: There was mild regurgitation. - Left atrium: The atrium was mildly dilated. - Pulmonary arteries: Systolic pressure could not be accurately   estimated.  Labs/Other Tests and Data Reviewed:    EKG:  No ECG reviewed.  Recent Labs: 10/07/2018: ALT 12 10/08/2018: TSH 1.141 10/18/2018: Magnesium 1.9 12/14/2018: BUN 15; Creatinine, Ser 0.77; Hemoglobin 13.8; Platelets 284; Potassium 4.1; Sodium 139   Recent Lipid Panel Lab Results  Component Value Date/Time   CHOL 172  10/08/2018 04:56 PM   TRIG 349 (H) 10/08/2018 04:56 PM   HDL 49 10/08/2018 04:56 PM   CHOLHDL 3.5 10/08/2018 04:56 PM   LDLCALC 53 10/08/2018 04:56 PM   LDLDIRECT 85.0 06/15/2017 08:15 AM    Wt Readings from Last 3 Encounters:  04/15/19 120 lb (54.4 kg)  01/07/19 119 lb 12.8 oz (54.3 kg)  12/25/18 120 lb 3.2 oz (54.5 kg)     Objective:    Vital Signs:  BP (!) 176/78 (BP Location: Left Arm, Patient Position: Sitting, Cuff Size: Normal)   Pulse 71   Ht 5\' 1"  (1.549 m)   Wt 120 lb (54.4 kg)   BMI 22.67 kg/m    VITAL SIGNS:  reviewed  ASSESSMENT & PLAN:    1. CAD involving the native coronary arteries without angina: She is doing well without any symptoms concerning for angina.  Recent [employer] showed no evidence of ischemia with a normal EF.  Continue current medications including dual antiplatelet therapy, metoprolol, Imdur, and Lipitor.  No plans for further ischemic evaluation at this time.  Aggressive secondary prevention.  2. HTN: Initial blood pressure was elevated in the 998 systolic with recheck blood pressure being 159/72.  She indicates that she was somewhat nervous for her appointment and has taken her medications already this morning.  We have elected to increase her lisinopril to 20 mg daily, otherwise she will remain on current doses of metoprolol and isosorbide.  She will contact me in 1 week with update of her blood pressure.  3. HLD: Most recent LDL at goal.  Continue atorvastatin.  COVID-19 Education: The signs and symptoms of COVID-19 were discussed with the patient and how to seek care for testing (follow up with PCP or arrange E-visit).  The importance of social distancing was discussed today.  Time:   Today, I have spent 8 minutes with the patient with telehealth technology discussing the above problems.     Medication Adjustments/Labs and Tests Ordered: Current medicines are reviewed at length with the patient today.  Concerns regarding medicines  are outlined above.   Tests Ordered: No orders of the defined types were placed in this encounter.   Medication Changes: No orders of the defined types were placed in this encounter.   Follow Up:  Virtual Visit in 3 month(s)  Signed, Christell Faith, PA-C  04/15/2019 10:06 AM    [place]

## 2019-04-12 NOTE — Telephone Encounter (Signed)
Second encounter opened in error.

## 2019-04-12 NOTE — Telephone Encounter (Signed)
Virtual Visit Pre-Appointment Phone Call  "Tanya Cole, I am calling you today to discuss your upcoming appointment. We are currently trying to limit exposure to the virus that causes COVID-19 by seeing patients at home rather than in the office."  1. "What is the BEST phone number to call the day of the visit?" - include this in appointment notes  2. Do you have or have access to (through a family member/friend) a smartphone with video capability that we can use for your visit?" a. If yes - list this number in appt notes as cell (if different from BEST phone #) and list the appointment type as a VIDEO visit in appointment notes b. If no - list the appointment type as a PHONE visit in appointment notes  3. Confirm consent - "In the setting of the current Covid19 crisis, you are scheduled for a (phone or video) visit with your provider on 04/15/2019 at 10:00AM.  Just as we do with many in-office visits, in order for you to participate in this visit, we must obtain consent.  If you'd like, I can send this to your mychart (if signed up) or email for you to review.  Otherwise, I can obtain your verbal consent now.  All virtual visits are billed to your insurance company just like a normal visit would be.  By agreeing to a virtual visit, we'd like you to understand that the technology does not allow for your provider to perform an examination, and thus may limit your provider's ability to fully assess your condition. If your provider identifies any concerns that need to be evaluated in person, we will make arrangements to do so.  Finally, though the technology is pretty good, we cannot assure that it will always work on either your or our end, and in the setting of a video visit, we may have to convert it to a phone-only visit.  In either situation, we cannot ensure that we have a secure connection.  Are you willing to proceed?" STAFF: Did the patient verbally acknowledge consent to telehealth visit?  Document YES/NO here: YES  4. Advise patient to be prepared - "Two hours prior to your appointment, go ahead and check your blood pressure, pulse, oxygen saturation, and your weight (if you have the equipment to check those) and write them all down. When your visit starts, your provider will ask you for this information. If you have an Apple Watch or Kardia device, please plan to have heart rate information ready on the day of your appointment. Please have a pen and paper handy nearby the day of the visit as well."  5. Give patient instructions for MyChart download to smartphone OR [email or web address] as below if video visit (depending on what platform provider is using)  6. Inform patient they will receive a phone call 15 minutes prior to their appointment time (may be from unknown caller ID) so they should be prepared to answer    TELEPHONE CALL NOTE  Tanya Cole has been deemed a candidate for a follow-up tele-health visit to limit community exposure during the Covid-19 pandemic. I spoke with the patient via phone to ensure availability of phone/video source, confirm preferred email & phone number, and discuss instructions and expectations.  I reminded Tanya Cole to be prepared with any vital sign and/or heart rhythm information that could potentially be obtained via home monitoring, at the time of her visit. I reminded Tanya Cole to expect a phone call prior to  her visit.  Rene Paci McClain 04/12/2019 3:16 PM    FULL LENGTH CONSENT FOR TELE-HEALTH VISIT   I hereby voluntarily request, consent and authorize [facility] and its employed or contracted physicians, physician assistants, nurse practitioners or other licensed health care professionals (the Practitioner), to provide me with telemedicine health care services (the Services") as deemed necessary by the treating Practitioner. I acknowledge and consent to receive the Services by the Practitioner via  telemedicine. I understand that the telemedicine visit will involve communicating with the Practitioner through live audiovisual communication technology and the disclosure of certain medical information by electronic transmission. I acknowledge that I have been given the opportunity to request an in-person assessment or other available alternative prior to the telemedicine visit and am voluntarily participating in the telemedicine visit.  I understand that I have the right to withhold or withdraw my consent to the use of telemedicine in the course of my care at any time, without affecting my right to future care or treatment, and that the Practitioner or I may terminate the telemedicine visit at any time. I understand that I have the right to inspect all information obtained and/or recorded in the course of the telemedicine visit and may receive copies of available information for a reasonable fee.  I understand that some of the potential risks of receiving the Services via telemedicine include:   Delay or interruption in medical evaluation due to technological equipment failure or disruption;  Information transmitted may not be sufficient (e.g. poor resolution of images) to allow for appropriate medical decision making by the Practitioner; and/or   In rare instances, security protocols could fail, causing a breach of personal health information.  Furthermore, I acknowledge that it is my responsibility to provide information about my medical history, conditions and care that is complete and accurate to the best of my ability. I acknowledge that Practitioner's advice, recommendations, and/or decision may be based on factors not within their control, such as incomplete or inaccurate data provided by me or distortions of diagnostic images or specimens that may result from electronic transmissions. I understand that the practice of medicine is not an exact science and that Practitioner makes no warranties or  guarantees regarding treatment outcomes. I acknowledge that I will receive a copy of this consent concurrently upon execution via email to the email address I last provided but may also request a printed copy by calling the office of [place].    I understand that my insurance will be billed for this visit.   I have read or had this consent read to me.  I understand the contents of this consent, which adequately explains the benefits and risks of the Services being provided via telemedicine.   I have been provided ample opportunity to ask questions regarding this consent and the Services and have had my questions answered to my satisfaction.  I give my informed consent for the services to be provided through the use of telemedicine in my medical care  By participating in this telemedicine visit I agree to the above.

## 2019-04-15 ENCOUNTER — Telehealth (INDEPENDENT_AMBULATORY_CARE_PROVIDER_SITE_OTHER): Payer: Medicare Other | Admitting: Physician Assistant

## 2019-04-15 ENCOUNTER — Encounter: Payer: Self-pay | Admitting: Physician Assistant

## 2019-04-15 ENCOUNTER — Other Ambulatory Visit: Payer: Self-pay

## 2019-04-15 VITALS — BP 176/78 | HR 71 | Ht 61.0 in | Wt 120.0 lb

## 2019-04-15 DIAGNOSIS — I2510 Atherosclerotic heart disease of native coronary artery without angina pectoris: Secondary | ICD-10-CM | POA: Diagnosis not present

## 2019-04-15 DIAGNOSIS — E785 Hyperlipidemia, unspecified: Secondary | ICD-10-CM

## 2019-04-15 DIAGNOSIS — I119 Hypertensive heart disease without heart failure: Secondary | ICD-10-CM

## 2019-04-15 DIAGNOSIS — I10 Essential (primary) hypertension: Secondary | ICD-10-CM

## 2019-04-15 MED ORDER — LISINOPRIL 20 MG PO TABS: 20.0000 mg | ORAL_TABLET | Freq: Every day | ORAL | 3 refills | Status: DC

## 2019-04-15 NOTE — Patient Instructions (Signed)
It was a pleasure to speak with you on the phone today! Thank you for allowing [place] to continue taking care of your [facility] needs during this time.   Feel free to call as needed for questions and concerns related to your cardiac needs.   Medication Instructions:  1- INCREASE Lisinopril to 1 tablet (20 mg total) once daily  If you need a refill on your cardiac medications before your next appointment, please call your pharmacy.   Lab work: None ordered  If you have labs (blood work) drawn today and your tests are completely normal, you will receive your results only by: Marland Kitchen MyChart Message (if you have MyChart) OR . A paper copy in the mail If you have any lab test that is abnormal or we need to change your treatment, we will call you to review the results.  Testing/Procedures: None ordered   Follow-Up: At [facility], you and your health needs are our priority.  As part of our continuing mission to provide you with exceptional heart care, we have created designated Provider Care Teams.  These Care Teams include your primary Cardiologist (physician) and Advanced Practice Providers (APPs -  Physician Assistants and Nurse Practitioners) who all work together to provide you with the care you need, when you need it. You will need a follow up appointment in 6 months.  Please call our office 2 months in advance to schedule this appointment.  You may see Kathlyn Sacramento, MD or Christell Faith, PA-C.    Any Other Special Instructions Will Be Listed Below (If Applicable). Call the clinic in 1 week with BP readings.   Call the clinic in 1 week with BP readings.  How to use a home blood pressure monitor. . Be still. Don't smoke, drink caffeinated beverages or exercise within 30 minutes before measuring your blood pressure. . Sit correctly. Sit with your back straight and supported (on a dining chair, rather than a sofa). Your feet should be flat on the floor and your legs should not be crossed. Your arm  should be supported on a flat surface (such as a table) with the upper arm at heart level. Make sure the bottom of the cuff is placed directly above the bend of the elbow.  . Measure at the same time every day. It's important to take the readings at the same time each day, such as morning and evening. Take reading approximately 1 hour after BP medications.

## 2019-04-30 ENCOUNTER — Ambulatory Visit (INDEPENDENT_AMBULATORY_CARE_PROVIDER_SITE_OTHER): Payer: Medicare Other

## 2019-04-30 ENCOUNTER — Other Ambulatory Visit: Payer: Self-pay

## 2019-04-30 DIAGNOSIS — E538 Deficiency of other specified B group vitamins: Secondary | ICD-10-CM

## 2019-04-30 MED ORDER — CYANOCOBALAMIN 1000 MCG/ML IJ SOLN
1000.0000 ug | Freq: Once | INTRAMUSCULAR | Status: AC
  Administered 2019-04-30: 1000 ug via INTRAMUSCULAR

## 2019-04-30 NOTE — Progress Notes (Addendum)
Tanya Cole presents today for injection per MD orders. B12 injection administered IM in left Upper Arm. Administration without incident. Patient tolerated well.  Vyla Pint,cma   Reviewed.  Dr Scott 

## 2019-05-13 ENCOUNTER — Other Ambulatory Visit: Payer: Self-pay | Admitting: Internal Medicine

## 2019-05-14 ENCOUNTER — Other Ambulatory Visit: Payer: Self-pay

## 2019-05-14 NOTE — Patient Outreach (Signed)
[facility]) Care Management  05/14/2019  [place] 1933/02/23 [id number]   Medication Adherence call to Mrs. [name] [facility] Identifiers Verify spoke with patient she is past due on Metformin Er 500 mg patient explain she  Is taking 1 tablet 2 times daily and has medictaion at this time she explain she never runs out of medication and her kids pick up her medications all the time. Mrs. Askari is showing past due under [place].   [place] Management Direct Dial [phone]  Fax [phone] [email or web address]

## 2019-05-15 NOTE — Telephone Encounter (Signed)
Pt called and request refill for metFORMIN (GLUCOPHAGE) 500 MG tablet / please advise

## 2019-05-24 ENCOUNTER — Other Ambulatory Visit: Payer: Self-pay

## 2019-05-24 ENCOUNTER — Other Ambulatory Visit (INDEPENDENT_AMBULATORY_CARE_PROVIDER_SITE_OTHER): Payer: Medicare Other

## 2019-05-24 DIAGNOSIS — E1159 Type 2 diabetes mellitus with other circulatory complications: Secondary | ICD-10-CM | POA: Diagnosis not present

## 2019-05-24 DIAGNOSIS — E7800 Pure hypercholesterolemia, unspecified: Secondary | ICD-10-CM

## 2019-05-24 DIAGNOSIS — I10 Essential (primary) hypertension: Secondary | ICD-10-CM

## 2019-05-24 LAB — HEPATIC FUNCTION PANEL
ALT: 12 U/L (ref 0–35)
AST: 15 U/L (ref 0–37)
Albumin: 4.5 g/dL (ref 3.5–5.2)
Alkaline Phosphatase: 73 U/L (ref 39–117)
Bilirubin, Direct: 0.1 mg/dL (ref 0.0–0.3)
Total Bilirubin: 0.6 mg/dL (ref 0.2–1.2)
Total Protein: 7.1 g/dL (ref 6.0–8.3)

## 2019-05-24 LAB — CBC WITH DIFFERENTIAL/PLATELET
Basophils Absolute: 0.1 10*3/uL (ref 0.0–0.1)
Basophils Relative: 0.6 % (ref 0.0–3.0)
Eosinophils Absolute: 0.5 10*3/uL (ref 0.0–0.7)
Eosinophils Relative: 6.3 % — ABNORMAL HIGH (ref 0.0–5.0)
HCT: 40.1 % (ref 36.0–46.0)
Hemoglobin: 13.4 g/dL (ref 12.0–15.0)
Lymphocytes Relative: 16.8 % (ref 12.0–46.0)
Lymphs Abs: 1.5 10*3/uL (ref 0.7–4.0)
MCHC: 33.4 g/dL (ref 30.0–36.0)
MCV: 86.5 fl (ref 78.0–100.0)
Monocytes Absolute: 0.5 10*3/uL (ref 0.1–1.0)
Monocytes Relative: 5.9 % (ref 3.0–12.0)
Neutro Abs: 6.1 10*3/uL (ref 1.4–7.7)
Neutrophils Relative %: 70.4 % (ref 43.0–77.0)
Platelets: 350 10*3/uL (ref 150.0–400.0)
RBC: 4.63 Mil/uL (ref 3.87–5.11)
RDW: 14.9 % (ref 11.5–15.5)
WBC: 8.7 10*3/uL (ref 4.0–10.5)

## 2019-05-24 LAB — LIPID PANEL
Cholesterol: 157 mg/dL (ref 0–200)
HDL: 45.6 mg/dL (ref >39.00)
LDL Cholesterol: 84 mg/dL (ref 0–99)
NonHDL: 111.73
Total CHOL/HDL Ratio: 3
Triglycerides: 140 mg/dL (ref 0.0–149.0)
VLDL: 28 mg/dL (ref 0.0–40.0)

## 2019-05-24 LAB — HEMOGLOBIN A1C: Hgb A1c MFr Bld: 6.4 % (ref 4.6–6.5)

## 2019-05-24 LAB — BASIC METABOLIC PANEL
BUN: 15 mg/dL (ref 6–23)
CO2: 26 mEq/L (ref 19–32)
Calcium: 9.8 mg/dL (ref 8.4–10.5)
Chloride: 104 mEq/L (ref 96–112)
Creatinine, Ser: 0.79 mg/dL (ref 0.40–1.20)
GFR: 69.35 mL/min (ref >60.00)
Glucose, Bld: 116 mg/dL — ABNORMAL HIGH (ref 70–99)
Potassium: 4.4 mEq/L (ref 3.5–5.1)
Sodium: 140 mEq/L (ref 135–145)

## 2019-05-24 LAB — MICROALBUMIN / CREATININE URINE RATIO
Creatinine,U: 188.3 mg/dL
Microalb Creat Ratio: 2.3 mg/g (ref 0.0–30.0)
Microalb, Ur: 4.3 mg/dL — ABNORMAL HIGH (ref 0.0–1.9)

## 2019-05-28 ENCOUNTER — Ambulatory Visit (INDEPENDENT_AMBULATORY_CARE_PROVIDER_SITE_OTHER): Payer: Medicare Other | Admitting: Internal Medicine

## 2019-05-28 ENCOUNTER — Other Ambulatory Visit: Payer: Self-pay

## 2019-05-28 ENCOUNTER — Encounter: Payer: Self-pay | Admitting: Internal Medicine

## 2019-05-28 DIAGNOSIS — R634 Abnormal weight loss: Secondary | ICD-10-CM

## 2019-05-28 DIAGNOSIS — M810 Age-related osteoporosis without current pathological fracture: Secondary | ICD-10-CM

## 2019-05-28 DIAGNOSIS — E7800 Pure hypercholesterolemia, unspecified: Secondary | ICD-10-CM

## 2019-05-28 DIAGNOSIS — I10 Essential (primary) hypertension: Secondary | ICD-10-CM | POA: Diagnosis not present

## 2019-05-28 DIAGNOSIS — J4520 Mild intermittent asthma, uncomplicated: Secondary | ICD-10-CM

## 2019-05-28 DIAGNOSIS — Z1231 Encounter for screening mammogram for malignant neoplasm of breast: Secondary | ICD-10-CM

## 2019-05-28 DIAGNOSIS — I2510 Atherosclerotic heart disease of native coronary artery without angina pectoris: Secondary | ICD-10-CM | POA: Diagnosis not present

## 2019-05-28 DIAGNOSIS — E1159 Type 2 diabetes mellitus with other circulatory complications: Secondary | ICD-10-CM | POA: Diagnosis not present

## 2019-05-28 MED ORDER — PROAIR HFA 108 (90 BASE) MCG/ACT IN AERS: 2.0000 | INHALATION_SPRAY | Freq: Four times a day (QID) | RESPIRATORY_TRACT | 2 refills | Status: DC | PRN

## 2019-05-28 NOTE — Progress Notes (Signed)
[name], female   DOB: [birth year], 85 y.o.   MRN: [id number]   Virtual Visit via telephone Note  This visit type was conducted due to national recommendations for restrictions regarding the COVID-19 pandemic (e.g. social distancing).  This format is felt to be most appropriate for this patient at this time.  All issues noted in this document were discussed and addressed.  No physical exam was performed (except for noted visual exam findings with Video Visits).   I connected with  [name] by telephone and verified that I am speaking with the correct person using two identifiers. Location patient: home Location provider: work  Persons participating in the telephone visit: patient, provider  I discussed the limitations, risks, security and privacy concerns of performing an evaluation and management service by telephone and the availability of in person appointments.  The patient expressed understanding and agreed to proceed.   Reason for visit: scheduled follow up  HPI: She reports she is doing relatively well.  Trying to stay in due to covid restrictions.  No fever. No increased cough, congestion or sob.  No chest pain.  Had f/u with Tanya Cole 04/15/19.  Heart stable.  Lisinopril increased to 10m q day.  Has not been checking her pressures.  No acid reflux. No abdominal pain.  Bowels moving.  No urine change.  Discussed labs.  Seeing Tanya Cole f/u osteoporosis.  On fosamax.  a1c 6.4.  Weight 120 pounds.     ROS: See pertinent positives and negatives per HPI.  Past Medical History:  Diagnosis Date  . Aortic insufficiency    a. TTE 12/19: EF 55-60%, probable HK of the mid apical anterior septal myocardium, Gr1DD, mild AI, mildly dilated LA  . Asthma   . CAD (coronary artery disease)    a. NSTEMI 12/19; b. LHC 10/08/18: LM minimal luminal irregs, mLAD-1 95% s/p PCI/DES, mLAD-2 60%, LCx mild diffuse disease throughout, RCA minimal luminal irregs  . Diabetes  mellitus ([place]   . Hypercholesterolemia   . Hypertension   . Osteopenia   . Polym[place]algia [place]a syndrome ([place]   . Reactive airway disease     Past Surgical History:  Procedure Laterality Date  . ABDOMINAL HYSTERECTOMY  1981   prolapse and bleeding, ovaries not removed  . BREAST EXCISIONAL BIOPSY Right   . CORONARY STENT INTERVENTION N/A 10/08/2018   Procedure: CORONARY STENT INTERVENTION;  Surgeon: AWellington Hampshire MD;  [facility];  Service: Cardiovascular;  Laterality: N/A;  . LEFT HEART CATH AND CORONARY ANGIOGRAPHY N/A 10/08/2018   Procedure: LEFT HEART CATH AND CORONARY ANGIOGRAPHY;  Surgeon: AWellington Hampshire MD;  [facility];  Service: Cardiovascular;  Laterality: N/A;  . UMBILICAL HERNIA REPAIR  7/94    Family History  Problem Relation Age of Onset  . Heart attack Father   . Arthritis Mother   . Heart disease Mother   . Throat cancer Sister   . Parkinson's disease Sister   . COPD Brother     SOCIAL HX: reviewed.    Current Outpatient Medications:  .  ADVAIR DISKUS 250-50 MCG/DOSE AEPB, INHALE ONE PUFF BY MOUTH EVERY 12 HOURS. RINSE MOUTH AFTER EACH USE, Disp: 60 each, Rfl: 5 .  alendronate (FOSAMAX) 70 MG tablet, 70 mg once a week. , Disp: , Rfl:  .  aspirin 81 MG tablet, Take 81 mg by mouth daily., Disp: , Rfl:  .  atorvastatin (LIPITOR) 40 MG tablet, Take 1  tablet (40 mg total) by mouth daily at 6 PM., Disp: 90 tablet, Rfl: 3 .  clopidogrel (PLAVIX) 75 MG tablet, Take 1 tablet (75 mg total) by mouth daily with breakfast., Disp: 90 tablet, Rfl: 3 .  cyanocobalamin (,VITAMIN B-12,) 1000 MCG/ML injection, Inject into the muscle., Disp: , Rfl:  .  fluticasone (FLONASE) 50 MCG/ACT nasal spray, USE 2 SPRAY(S) IN EACH NOSTRIL ONCE DAILY (Patient taking differently: Place 1 spray into both nostrils daily. ), Disp: 16 g, Rfl: 3 .  isosorbide mononitrate (IMDUR) 30 MG 24 hr tablet, Take 1 tablet (30 mg total) by mouth daily., Disp:  30 tablet, Rfl: 0 .  lisinopril (ZESTRIL) 20 MG tablet, Take 1 tablet (20 mg total) by mouth daily., Disp: 90 tablet, Rfl: 3 .  metFORMIN (GLUCOPHAGE) 500 MG tablet, TAKE 1 TABLET BY MOUTH TWICE DAILY WITH A MEAL, Disp: 180 tablet, Rfl: 1 .  metoprolol tartrate (LOPRESSOR) 25 MG tablet, Take 1 tablet (25 mg total) by mouth 2 (two) times daily., Disp: 90 tablet, Rfl: 3 .  Multiple Vitamins-Minerals (VISION FORMULA/LUTEIN PO), Take by mouth daily., Disp: , Rfl:  .  nitroGLYCERIN (NITROSTAT) 0.4 MG SL tablet, Place 1 tablet (0.4 mg total) under the tongue every 5 (five) minutes as needed for chest pain., Disp: 30 tablet, Rfl: 12 .  PROAIR HFA 108 (90 Base) MCG/ACT inhaler, Inhale 2 puffs into the lungs every 6 (six) hours as needed., Disp: 18 g, Rfl: 2 .  Vitamin D, Ergocalciferol, (DRISDOL) 50000 units CAPS capsule, Take 50,000 Units by mouth once a week., Disp: , Rfl: 3  EXAM:  VITALS per patient if applicable:  120 pounds  GENERAL: alert. Sounds to be in no acute distress.  Answering questions appropriately.    PSYCH/NEURO: pleasant and cooperative, no obvious depression or anxiety, speech and thought processing grossly intact  ASSESSMENT AND PLAN:  Discussed the following assessment and plan:  CAD (coronary artery disease) S/p PCI LAD.  Recent evaluation - Tanya Cole.  Lisinopril increased to 20mg.  No pain.  Currently stable. Continue risk factor modification.   Diabetes mellitus with cardiac complication (HCC) Low carb diet and exercise.  a1c 6.4.  Follow met b and a1c.    Hypercholesterolemia On lipitor. Follow lipid panel and liver function tests.   Lab Results  Component Value Date   CHOL 157 05/24/2019   HDL 45.60 05/24/2019   LDLCALC 84 05/24/2019   LDLDIRECT 85.0 06/15/2017   TRIG 140.0 05/24/2019   CHOLHDL 3 05/24/2019    Hypertension Just recently had lisinopril increased to 20mg q day.  Have her spot check her pressure.  Follow metabolic panel.    Reactive airway  disease Breathing stable.  Refilled pro air to have if needed.    Weight loss Weight 120 pounds.  Reports eating well.  Follow.    Osteoporosis On fosamax.      I discussed the assessment and treatment plan with the patient. The patient was provided an opportunity to ask questions and all were answered. The patient agreed with the plan and demonstrated an understanding of the instructions.   The patient was advised to call back or seek an in-person evaluation if the symptoms worsen or if the condition fails to improve as anticipated.  I provided 15 minutes of non-face-to-face time during this encounter.    , MD  

## 2019-05-29 ENCOUNTER — Encounter: Payer: Self-pay | Admitting: Internal Medicine

## 2019-06-02 ENCOUNTER — Encounter: Payer: Self-pay | Admitting: Internal Medicine

## 2019-06-02 NOTE — Assessment & Plan Note (Signed)
On lipitor. Follow lipid panel and liver function tests.   Lab Results  Component Value Date   CHOL 157 05/24/2019   HDL 45.60 05/24/2019   LDLCALC 84 05/24/2019   LDLDIRECT 85.0 06/15/2017   TRIG 140.0 05/24/2019   CHOLHDL 3 05/24/2019

## 2019-06-02 NOTE — Assessment & Plan Note (Signed)
-  On fosamax

## 2019-06-02 NOTE — Assessment & Plan Note (Signed)
S/p PCI LAD.  Recent evaluation - Christell Faith.  Lisinopril increased to 20mg .  No pain.  Currently stable. Continue risk factor modification.

## 2019-06-02 NOTE — Assessment & Plan Note (Signed)
Breathing stable.  Refilled pro air to have if needed.

## 2019-06-02 NOTE — Assessment & Plan Note (Signed)
Low carb diet and exercise.  a1c 6.4.  Follow met b and a1c.  

## 2019-06-02 NOTE — Assessment & Plan Note (Signed)
Weight 120 pounds.  Reports eating well.  Follow.

## 2019-06-02 NOTE — Assessment & Plan Note (Signed)
Just recently had lisinopril increased to 20mg  q day.  Have her spot check her pressure.  Follow metabolic panel.

## 2019-06-04 ENCOUNTER — Ambulatory Visit (INDEPENDENT_AMBULATORY_CARE_PROVIDER_SITE_OTHER): Payer: Medicare HMO

## 2019-06-04 ENCOUNTER — Other Ambulatory Visit: Payer: Self-pay

## 2019-06-04 DIAGNOSIS — E538 Deficiency of other specified B group vitamins: Secondary | ICD-10-CM

## 2019-06-04 MED ORDER — CYANOCOBALAMIN 1000 MCG/ML IJ SOLN
1000.0000 ug | Freq: Once | INTRAMUSCULAR | Status: AC
  Administered 2019-06-04: 1000 ug via INTRAMUSCULAR

## 2019-06-04 NOTE — Progress Notes (Addendum)
Patient presented today for B12 injection.  Administered IM in right deltoid.  Patient tolerated well with no signs of distress.  Reviewed.  Dr Scott    

## 2019-06-18 ENCOUNTER — Encounter: Payer: Self-pay | Admitting: Family Medicine

## 2019-06-18 ENCOUNTER — Other Ambulatory Visit: Payer: Self-pay

## 2019-06-18 ENCOUNTER — Telehealth: Payer: Self-pay | Admitting: *Deleted

## 2019-06-18 ENCOUNTER — Ambulatory Visit (INDEPENDENT_AMBULATORY_CARE_PROVIDER_SITE_OTHER): Payer: Medicare HMO | Admitting: Family Medicine

## 2019-06-18 DIAGNOSIS — Z20828 Contact with and (suspected) exposure to other viral communicable diseases: Secondary | ICD-10-CM

## 2019-06-18 DIAGNOSIS — J989 Respiratory disorder, unspecified: Secondary | ICD-10-CM | POA: Insufficient documentation

## 2019-06-18 MED ORDER — PREDNISONE 20 MG PO TABS: 40.0000 mg | ORAL_TABLET | Freq: Every day | ORAL | 0 refills | Status: DC

## 2019-06-18 NOTE — Progress Notes (Signed)
Virtual Visit via telephone Note  This visit type was conducted due to national recommendations for restrictions regarding the COVID-19 pandemic (e.g. social distancing).  This format is felt to be most appropriate for this patient at this time.  All issues noted in this document were discussed and addressed.  No physical exam was performed (except for noted visual exam findings with Video Visits).   I connected with [name] today at 11:00 AM EDT by telephone and verified that I am speaking with the correct person using two identifiers. Location patient: home Location provider: work Persons participating in the virtual visit: patient, provider  I discussed the limitations, risks, security and privacy concerns of performing an evaluation and management service by telephone and the availability of in person appointments. I also discussed with the patient that there may be a patient responsible charge related to this service. The patient expressed understanding and agreed to proceed.  Interactive audio and video telecommunications were attempted between this provider and patient, however failed, due to patient having technical difficulties OR patient did not have access to video capability.  We continued and completed visit with audio only.   Reason for visit: same day visit  HPI: Respiratory illness: Patient notes onset of symptoms yesterday with cough and mild shortness of breath.  Cough is productive of mild white mucus.  She does report a history of COPD.  She notes no chest congestion or sinus congestion.  Some postnasal drip.  No fevers.  Some wheezing.  She has not taken any Tylenol or ibuprofen.  No alterations of smell or taste.  She uses Advair daily.  Uses albuterol as needed.  She did use this yesterday and it was somewhat beneficial.  She denies any COVID-19 exposure.  She does have a history of bronchitis.   ROS: See pertinent positives and negatives per HPI.  Past Medical  History:  Diagnosis Date  . Aortic insufficiency    a. TTE 12/19: EF 55-60%, probable HK of the mid apical anterior septal myocardium, Gr1DD, mild AI, mildly dilated LA  . Asthma   . CAD (coronary artery disease)    a. NSTEMI 12/19; b. LHC 10/08/18: LM minimal luminal irregs, mLAD-1 95% s/p PCI/DES, mLAD-2 60%, LCx mild diffuse disease throughout, RCA minimal luminal irregs  . Diabetes mellitus ([place])   . Hypercholesterolemia   . Hypertension   . Osteopenia   . Polymyalgia rheumatica syndrome ([place])   . Reactive airway disease     Past Surgical History:  Procedure Laterality Date  . ABDOMINAL HYSTERECTOMY  1981   prolapse and bleeding, ovaries not removed  . BREAST EXCISIONAL BIOPSY Right   . CORONARY STENT INTERVENTION N/A 10/08/2018   Procedure: CORONARY STENT INTERVENTION;  Surgeon: Wellington Hampshire, MD;  Location: [facility];  Service: Cardiovascular;  Laterality: N/A;  . LEFT HEART CATH AND CORONARY ANGIOGRAPHY N/A 10/08/2018   Procedure: LEFT HEART CATH AND CORONARY ANGIOGRAPHY;  Surgeon: Wellington Hampshire, MD;  Location: [facility];  Service: Cardiovascular;  Laterality: N/A;  . UMBILICAL HERNIA REPAIR  7/94    Family History  Problem Relation Age of Onset  . Heart attack Father   . Arthritis Mother   . Heart disease Mother   . Throat cancer Sister   . Parkinson's disease Sister   . COPD Brother     SOCIAL HX: Non-smoker.   Current Outpatient Medications:  .  ADVAIR DISKUS 250-50 MCG/DOSE AEPB, INHALE ONE PUFF BY MOUTH EVERY 12  HOURS. RINSE MOUTH AFTER EACH USE, Disp: 60 each, Rfl: 5 .  alendronate (FOSAMAX) 70 MG tablet, 70 mg once a week. , Disp: , Rfl:  .  aspirin 81 MG tablet, Take 81 mg by mouth daily., Disp: , Rfl:  .  atorvastatin (LIPITOR) 40 MG tablet, Take 1 tablet (40 mg total) by mouth daily at 6 PM., Disp: 90 tablet, Rfl: 3 .  clopidogrel (PLAVIX) 75 MG tablet, Take 1 tablet (75 mg total) by mouth daily with breakfast., Disp: 90 tablet,  Rfl: 3 .  cyanocobalamin (,VITAMIN B-12,) 1000 MCG/ML injection, Inject into the muscle., Disp: , Rfl:  .  fluticasone (FLONASE) 50 MCG/ACT nasal spray, USE 2 SPRAY(S) IN EACH NOSTRIL ONCE DAILY (Patient taking differently: Place 1 spray into both nostrils daily. ), Disp: 16 g, Rfl: 3 .  isosorbide mononitrate (IMDUR) 30 MG 24 hr tablet, Take 1 tablet (30 mg total) by mouth daily., Disp: 30 tablet, Rfl: 0 .  lisinopril (ZESTRIL) 20 MG tablet, Take 1 tablet (20 mg total) by mouth daily., Disp: 90 tablet, Rfl: 3 .  metFORMIN (GLUCOPHAGE) 500 MG tablet, TAKE 1 TABLET BY MOUTH TWICE DAILY WITH A MEAL, Disp: 180 tablet, Rfl: 1 .  metoprolol tartrate (LOPRESSOR) 25 MG tablet, Take 1 tablet (25 mg total) by mouth 2 (two) times daily., Disp: 90 tablet, Rfl: 3 .  Multiple Vitamins-Minerals (VISION FORMULA/LUTEIN PO), Take by mouth daily., Disp: , Rfl:  .  nitroGLYCERIN (NITROSTAT) 0.4 MG SL tablet, Place 1 tablet (0.4 mg total) under the tongue every 5 (five) minutes as needed for chest pain., Disp: 30 tablet, Rfl: 12 .  PROAIR HFA 108 (90 Base) MCG/ACT inhaler, Inhale 2 puffs into the lungs every 6 (six) hours as needed., Disp: 18 g, Rfl: 2 .  Vitamin D, Ergocalciferol, (DRISDOL) 50000 units CAPS capsule, Take 50,000 Units by mouth once a week., Disp: , Rfl: 3 .  predniSONE (DELTASONE) 20 MG tablet, Take 2 tablets (40 mg total) by mouth daily with breakfast., Disp: 10 tablet, Rfl: 0  EXAM: This is a telehealth telephone visit notes no physical exam was completed.  She was speaking in full sentences with no obvious dyspnea on the phone call.  ASSESSMENT AND PLAN:  Discussed the following assessment and plan:  Respiratory illness Likely related to reactive airway disease/possible COPD though I did discuss that we cannot definitively rule out COVID-19 given her symptoms.  I advised COVID-19 testing though she notes she has a hard time getting around and would likely not be able to go get tested.  Discussed  quarantining for at least 10 days from onset of symptoms and that she would need to have at least 3 days without any fever and at least 3 days with improvement in her symptoms in addition to being at least 10 days from onset of symptoms prior to coming off of quarantine.  We will treat her reactive airway component with prednisone and her albuterol inhaler to be used every 6 hours for the next 2 days and then as needed.  She notes her family will pick up the prednisone for her.  I advised that they should drop this at her front door without the door being open.  Discussed that if she needed any food or supplies during her quarantine that her family should drop them at the front door so she can get them after they leave.  Advised that quarantining meant not leaving the house except for a medical emergency and that she should  not have visitors.  Advised of reasons to seek medical attention in the emergency department.  We will have her follow-up with me or her PCP later this week.    I discussed the assessment and treatment plan with the patient. The patient was provided an opportunity to ask questions and all were answered. The patient agreed with the plan and demonstrated an understanding of the instructions.   The patient was advised to call back or seek an in-person evaluation if the symptoms worsen or if the condition fails to improve as anticipated.  I provided 14 minutes of non-face-to-face time during this encounter.   Marikay Alar, MD

## 2019-06-18 NOTE — Telephone Encounter (Signed)
Pt's daughter teary, called  regarding the virtual office with her mom today. She is concerned that she is being treated for bronchitis and not covid-19. Per the patient's chart, has been prescribed prednisone and an inhaler for her breathing. Advised that with her respiratory illness she should not be around any one that could possibly spread covid-19 to her and make her really sick. She voiced understanding.

## 2019-06-18 NOTE — Assessment & Plan Note (Signed)
Likely related to reactive airway disease/possible COPD though I did discuss that we cannot definitively rule out COVID-19 given her symptoms.  I advised COVID-19 testing though she notes she has a hard time getting around and would likely not be able to go get tested.  Discussed quarantining for at least 10 days from onset of symptoms and that she would need to have at least 3 days without any fever and at least 3 days with improvement in her symptoms in addition to being at least 10 days from onset of symptoms prior to coming off of quarantine.  We will treat her reactive airway component with prednisone and her albuterol inhaler to be used every 6 hours for the next 2 days and then as needed.  She notes her family will pick up the prednisone for her.  I advised that they should drop this at her front door without the door being open.  Discussed that if she needed any food or supplies during her quarantine that her family should drop them at the front door so she can get them after they leave.  Advised that quarantining meant not leaving the house except for a medical emergency and that she should not have visitors.  Advised of reasons to seek medical attention in the emergency department.  We will have her follow-up with me or her PCP later this week.

## 2019-06-18 NOTE — Telephone Encounter (Signed)
Copied from [place] [phone]. Topic: General - Other >> Jun 18, 2019 12:13 PM Rainey Pines A wrote: Tawni Levy, patients daughter ,would like a callback from Dr. Tharon Aquas nurse in regards to patients virtual visit today.

## 2019-06-19 ENCOUNTER — Ambulatory Visit: Payer: Medicare HMO | Admitting: Internal Medicine

## 2019-06-19 NOTE — Telephone Encounter (Signed)
I advised the patient that I wanted to test her for COVID-19 and she declined. I discussed with her that she needed to quarantine given her symptoms could be covid19 related and gave her the quarantine time frame for her to be able to come off of quarantine. If she is willing to be tested then I can place an order.

## 2019-06-19 NOTE — Telephone Encounter (Signed)
Saw Dr. Caryl Bis on 06/18/19 treated for Bronchitis.

## 2019-06-19 NOTE — Telephone Encounter (Signed)
Noted  

## 2019-06-19 NOTE — Telephone Encounter (Signed)
Patient say she knows when she has bronchitis and this is bronchitis patient says he doeas not go out and she could not have COVID , refuses testing and stated she would go  To ER or UC if symptoms worsen. Patient said she feels little better today.

## 2019-06-21 ENCOUNTER — Other Ambulatory Visit: Payer: Self-pay

## 2019-06-21 ENCOUNTER — Ambulatory Visit (INDEPENDENT_AMBULATORY_CARE_PROVIDER_SITE_OTHER): Payer: Medicare HMO | Admitting: Family Medicine

## 2019-06-21 ENCOUNTER — Encounter: Payer: Self-pay | Admitting: Family Medicine

## 2019-06-21 DIAGNOSIS — J989 Respiratory disorder, unspecified: Secondary | ICD-10-CM

## 2019-06-21 NOTE — Assessment & Plan Note (Signed)
Much improved.  I suspect this is related to reactive airway disease and bronchitis though I did discuss that it could be COVID-19 and given that she did not want to get tested for this she needs to meet quarantine criteria to come off of quarantine.  Discussed strict quarantine until she has met the 10-day criteria since onset of symptoms with at least 3 days improvement in symptoms and 3 days without a fever.  She will contact us if with any worsening symptoms.

## 2019-06-21 NOTE — Progress Notes (Signed)
Virtual Visit via telephone Note  This visit type was conducted due to national recommendations for restrictions regarding the COVID-19 pandemic (e.g. social distancing).  This format is felt to be most appropriate for this patient at this time.  All issues noted in this document were discussed and addressed.  No physical exam was performed (except for noted visual exam findings with Video Visits).   I connected with [name] today at  8:30 AM EDT by telephone and verified that I am speaking with the correct person using two identifiers. Location patient: home Location provider: work Persons participating in the virtual visit: patient, provider  I discussed the limitations, risks, security and privacy concerns of performing an evaluation and management service by telephone and the availability of in person appointments. I also discussed with the patient that there may be a patient responsible charge related to this service. The patient expressed understanding and agreed to proceed.  Interactive audio and video telecommunications were attempted between this provider and patient, however failed, due to patient having technical difficulties OR patient did not have access to video capability.  We continued and completed visit with audio only.  Reason for visit: follow-up  HPI: Respiratory illness: Patient reports she has improved 80%.  Her cough is improving.  She notes no congestion.  Shortness of breath is resolved.  No wheezing.  No fevers.  No taste or smell disturbances.  She is continuing on prednisone.   ROS: See pertinent positives and negatives per HPI.  Past Medical History:  Diagnosis Date  . Aortic insufficiency    a. TTE 12/19: EF 55-60%, probable HK of the mid apical anterior septal myocardium, Gr1DD, mild AI, mildly dilated LA  . Asthma   . CAD (coronary artery disease)    a. NSTEMI 12/19; b. LHC 10/08/18: LM minimal luminal irregs, mLAD-1 95% s/p PCI/DES, mLAD-2 60%,  LCx mild diffuse disease throughout, RCA minimal luminal irregs  . Diabetes mellitus ([place])   . Hypercholesterolemia   . Hypertension   . Osteopenia   . Polymyalgia rheumatica syndrome ([place])   . Reactive airway disease     Past Surgical History:  Procedure Laterality Date  . ABDOMINAL HYSTERECTOMY  1981   prolapse and bleeding, ovaries not removed  . BREAST EXCISIONAL BIOPSY Right   . CORONARY STENT INTERVENTION N/A 10/08/2018   Procedure: CORONARY STENT INTERVENTION;  Surgeon: Wellington Hampshire, MD;  Location: [facility];  Service: Cardiovascular;  Laterality: N/A;  . LEFT HEART CATH AND CORONARY ANGIOGRAPHY N/A 10/08/2018   Procedure: LEFT HEART CATH AND CORONARY ANGIOGRAPHY;  Surgeon: Wellington Hampshire, MD;  Location: [facility];  Service: Cardiovascular;  Laterality: N/A;  . UMBILICAL HERNIA REPAIR  7/94    Family History  Problem Relation Age of Onset  . Heart attack Father   . Arthritis Mother   . Heart disease Mother   . Throat cancer Sister   . Parkinson's disease Sister   . COPD Brother     SOCIAL HX: Non-smoker.   Current Outpatient Medications:  .  ADVAIR DISKUS 250-50 MCG/DOSE AEPB, INHALE ONE PUFF BY MOUTH EVERY 12 HOURS. RINSE MOUTH AFTER EACH USE, Disp: 60 each, Rfl: 5 .  alendronate (FOSAMAX) 70 MG tablet, 70 mg once a week. , Disp: , Rfl:  .  aspirin 81 MG tablet, Take 81 mg by mouth daily., Disp: , Rfl:  .  atorvastatin (LIPITOR) 40 MG tablet, Take 1 tablet (40 mg total) by mouth daily at  6 PM., Disp: 90 tablet, Rfl: 3 .  clopidogrel (PLAVIX) 75 MG tablet, Take 1 tablet (75 mg total) by mouth daily with breakfast., Disp: 90 tablet, Rfl: 3 .  cyanocobalamin (,VITAMIN B-12,) 1000 MCG/ML injection, Inject into the muscle., Disp: , Rfl:  .  fluticasone (FLONASE) 50 MCG/ACT nasal spray, USE 2 SPRAY(S) IN EACH NOSTRIL ONCE DAILY (Patient taking differently: Place 1 spray into both nostrils daily. ), Disp: 16 g, Rfl: 3 .  isosorbide mononitrate  (IMDUR) 30 MG 24 hr tablet, Take 1 tablet (30 mg total) by mouth daily., Disp: 30 tablet, Rfl: 0 .  lisinopril (ZESTRIL) 20 MG tablet, Take 1 tablet (20 mg total) by mouth daily., Disp: 90 tablet, Rfl: 3 .  metFORMIN (GLUCOPHAGE) 500 MG tablet, TAKE 1 TABLET BY MOUTH TWICE DAILY WITH A MEAL, Disp: 180 tablet, Rfl: 1 .  metoprolol tartrate (LOPRESSOR) 25 MG tablet, Take 1 tablet (25 mg total) by mouth 2 (two) times daily., Disp: 90 tablet, Rfl: 3 .  Multiple Vitamins-Minerals (VISION FORMULA/LUTEIN PO), Take by mouth daily., Disp: , Rfl:  .  nitroGLYCERIN (NITROSTAT) 0.4 MG SL tablet, Place 1 tablet (0.4 mg total) under the tongue every 5 (five) minutes as needed for chest pain., Disp: 30 tablet, Rfl: 12 .  predniSONE (DELTASONE) 20 MG tablet, Take 2 tablets (40 mg total) by mouth daily with breakfast., Disp: 10 tablet, Rfl: 0 .  PROAIR HFA 108 (90 Base) MCG/ACT inhaler, Inhale 2 puffs into the lungs every 6 (six) hours as needed., Disp: 18 g, Rfl: 2 .  Vitamin D, Ergocalciferol, (DRISDOL) 50000 units CAPS capsule, Take 50,000 Units by mouth once a week., Disp: , Rfl: 3  EXAM: This was a telehealth telephone visit and thus no physical exam was completed.   ASSESSMENT AND PLAN:  Discussed the following assessment and plan:  Respiratory illness Much improved.  I suspect this is related to reactive airway disease and bronchitis though I did discuss that it could be COVID-19 and given that she did not want to get tested for this she needs to meet quarantine criteria to come off of quarantine.  Discussed strict quarantine until she has met the 10-day criteria since onset of symptoms with at least 3 days improvement in symptoms and 3 days without a fever.  She will contact us if with any worsening symptoms.    I discussed the assessment and treatment plan with the patient. The patient was provided an opportunity to ask questions and all were answered. The patient agreed with the plan and demonstrated  an understanding of the instructions.   The patient was advised to call back or seek an in-person evaluation if the symptoms worsen or if the condition fails to improve as anticipated.  I provided 5 minutes of non-face-to-face time during this encounter.   Tommi Rumps, MD

## 2019-07-09 ENCOUNTER — Other Ambulatory Visit: Payer: Self-pay

## 2019-07-09 ENCOUNTER — Ambulatory Visit (INDEPENDENT_AMBULATORY_CARE_PROVIDER_SITE_OTHER): Payer: Medicare HMO

## 2019-07-09 DIAGNOSIS — E538 Deficiency of other specified B group vitamins: Secondary | ICD-10-CM

## 2019-07-09 MED ORDER — CYANOCOBALAMIN 1000 MCG/ML IJ SOLN
1000.0000 ug | Freq: Once | INTRAMUSCULAR | Status: AC
  Administered 2019-07-09: 1000 ug via INTRAMUSCULAR

## 2019-07-23 NOTE — Progress Notes (Addendum)
Patient presented for B 12 injection to left deltoid, patient voiced no concerns nor showed any signs of distress during injection.  Reviewed.  Dr Scott 

## 2019-08-04 ENCOUNTER — Other Ambulatory Visit: Payer: Self-pay

## 2019-08-04 ENCOUNTER — Emergency Department: Payer: Medicare HMO

## 2019-08-04 ENCOUNTER — Inpatient Hospital Stay
Admission: EM | Admit: 2019-08-04 | Discharge: 2019-08-09 | DRG: 445 | Disposition: A | Discharge status: Home / Self Care | Payer: Medicare HMO | Attending: Internal Medicine | Admitting: Internal Medicine

## 2019-08-04 DIAGNOSIS — R7401 Elevation of levels of liver transaminase levels: Secondary | ICD-10-CM | POA: Diagnosis not present

## 2019-08-04 DIAGNOSIS — K828 Other specified diseases of gallbladder: Secondary | ICD-10-CM | POA: Diagnosis not present

## 2019-08-04 DIAGNOSIS — Z20828 Contact with and (suspected) exposure to other viral communicable diseases: Secondary | ICD-10-CM | POA: Diagnosis present

## 2019-08-04 DIAGNOSIS — Z7952 Long term (current) use of systemic steroids: Secondary | ICD-10-CM

## 2019-08-04 DIAGNOSIS — N390 Urinary tract infection, site not specified: Secondary | ICD-10-CM | POA: Diagnosis present

## 2019-08-04 DIAGNOSIS — Z7902 Long term (current) use of antithrombotics/antiplatelets: Secondary | ICD-10-CM

## 2019-08-04 DIAGNOSIS — K8050 Calculus of bile duct without cholangitis or cholecystitis without obstruction: Secondary | ICD-10-CM | POA: Diagnosis not present

## 2019-08-04 DIAGNOSIS — Z7982 Long term (current) use of aspirin: Secondary | ICD-10-CM

## 2019-08-04 DIAGNOSIS — K819 Cholecystitis, unspecified: Secondary | ICD-10-CM | POA: Diagnosis present

## 2019-08-04 DIAGNOSIS — R1084 Generalized abdominal pain: Secondary | ICD-10-CM | POA: Diagnosis not present

## 2019-08-04 DIAGNOSIS — R1010 Upper abdominal pain, unspecified: Secondary | ICD-10-CM | POA: Diagnosis not present

## 2019-08-04 DIAGNOSIS — R1011 Right upper quadrant pain: Secondary | ICD-10-CM | POA: Diagnosis present

## 2019-08-04 DIAGNOSIS — K824 Cholesterolosis of gallbladder: Secondary | ICD-10-CM | POA: Diagnosis not present

## 2019-08-04 DIAGNOSIS — E876 Hypokalemia: Secondary | ICD-10-CM | POA: Diagnosis present

## 2019-08-04 DIAGNOSIS — B9620 Unspecified Escherichia coli [E. coli] as the cause of diseases classified elsewhere: Secondary | ICD-10-CM | POA: Diagnosis present

## 2019-08-04 DIAGNOSIS — R7881 Bacteremia: Secondary | ICD-10-CM | POA: Diagnosis present

## 2019-08-04 DIAGNOSIS — J449 Chronic obstructive pulmonary disease, unspecified: Secondary | ICD-10-CM | POA: Diagnosis present

## 2019-08-04 DIAGNOSIS — E7800 Pure hypercholesterolemia, unspecified: Secondary | ICD-10-CM | POA: Diagnosis present

## 2019-08-04 DIAGNOSIS — E119 Type 2 diabetes mellitus without complications: Secondary | ICD-10-CM | POA: Diagnosis present

## 2019-08-04 DIAGNOSIS — M353 Polymyalgia rheumatica: Secondary | ICD-10-CM | POA: Diagnosis present

## 2019-08-04 DIAGNOSIS — K8020 Calculus of gallbladder without cholecystitis without obstruction: Secondary | ICD-10-CM | POA: Diagnosis not present

## 2019-08-04 DIAGNOSIS — Z90710 Acquired absence of both cervix and uterus: Secondary | ICD-10-CM

## 2019-08-04 DIAGNOSIS — R0902 Hypoxemia: Secondary | ICD-10-CM | POA: Diagnosis not present

## 2019-08-04 DIAGNOSIS — R52 Pain, unspecified: Secondary | ICD-10-CM | POA: Diagnosis not present

## 2019-08-04 DIAGNOSIS — K810 Acute cholecystitis: Secondary | ICD-10-CM | POA: Diagnosis not present

## 2019-08-04 DIAGNOSIS — Z7951 Long term (current) use of inhaled steroids: Secondary | ICD-10-CM

## 2019-08-04 DIAGNOSIS — K8062 Calculus of gallbladder and bile duct with acute cholecystitis without obstruction: Secondary | ICD-10-CM | POA: Diagnosis present

## 2019-08-04 DIAGNOSIS — Z79899 Other long term (current) drug therapy: Secondary | ICD-10-CM

## 2019-08-04 DIAGNOSIS — B961 Klebsiella pneumoniae [K. pneumoniae] as the cause of diseases classified elsewhere: Secondary | ICD-10-CM | POA: Diagnosis present

## 2019-08-04 DIAGNOSIS — Z955 Presence of coronary angioplasty implant and graft: Secondary | ICD-10-CM

## 2019-08-04 DIAGNOSIS — I2510 Atherosclerotic heart disease of native coronary artery without angina pectoris: Secondary | ICD-10-CM | POA: Diagnosis present

## 2019-08-04 DIAGNOSIS — Z7984 Long term (current) use of oral hypoglycemic drugs: Secondary | ICD-10-CM

## 2019-08-04 DIAGNOSIS — I10 Essential (primary) hypertension: Secondary | ICD-10-CM | POA: Diagnosis present

## 2019-08-04 DIAGNOSIS — I959 Hypotension, unspecified: Secondary | ICD-10-CM | POA: Diagnosis present

## 2019-08-04 DIAGNOSIS — Z66 Do not resuscitate: Secondary | ICD-10-CM | POA: Diagnosis present

## 2019-08-04 DIAGNOSIS — I252 Old myocardial infarction: Secondary | ICD-10-CM

## 2019-08-04 DIAGNOSIS — R509 Fever, unspecified: Secondary | ICD-10-CM | POA: Diagnosis not present

## 2019-08-04 DIAGNOSIS — R112 Nausea with vomiting, unspecified: Secondary | ICD-10-CM | POA: Diagnosis not present

## 2019-08-04 DIAGNOSIS — K859 Acute pancreatitis without necrosis or infection, unspecified: Secondary | ICD-10-CM | POA: Diagnosis not present

## 2019-08-04 DIAGNOSIS — K8000 Calculus of gallbladder with acute cholecystitis without obstruction: Secondary | ICD-10-CM

## 2019-08-04 DIAGNOSIS — Z03818 Encounter for observation for suspected exposure to other biological agents ruled out: Secondary | ICD-10-CM | POA: Diagnosis not present

## 2019-08-04 DIAGNOSIS — K5730 Diverticulosis of large intestine without perforation or abscess without bleeding: Secondary | ICD-10-CM | POA: Diagnosis not present

## 2019-08-04 LAB — CBC WITH DIFFERENTIAL/PLATELET
Abs Immature Granulocytes: 0.03 10*3/uL (ref 0.00–0.07)
Basophils Absolute: 0 10*3/uL (ref 0.0–0.1)
Basophils Relative: 0 %
Eosinophils Absolute: 0.4 10*3/uL (ref 0.0–0.5)
Eosinophils Relative: 4 %
HCT: 39.7 % (ref 36.0–46.0)
Hemoglobin: 12.8 g/dL (ref 12.0–15.0)
Immature Granulocytes: 0 %
Lymphocytes Relative: 3 %
Lymphs Abs: 0.3 10*3/uL — ABNORMAL LOW (ref 0.7–4.0)
MCH: 28.1 pg (ref 26.0–34.0)
MCHC: 32.2 g/dL (ref 30.0–36.0)
MCV: 87.1 fL (ref 80.0–100.0)
Monocytes Absolute: 0.1 10*3/uL (ref 0.1–1.0)
Monocytes Relative: 1 %
Neutro Abs: 9 10*3/uL — ABNORMAL HIGH (ref 1.7–7.7)
Neutrophils Relative %: 92 %
Platelets: 256 10*3/uL (ref 150–400)
RBC: 4.56 MIL/uL (ref 3.87–5.11)
RDW: 14.6 % (ref 11.5–15.5)
WBC: 9.9 10*3/uL (ref 4.0–10.5)
nRBC: 0 % (ref 0.0–0.2)

## 2019-08-04 LAB — LACTIC ACID, PLASMA: Lactic Acid, Venous: 2.1 mmol/L (ref 0.5–1.9)

## 2019-08-04 LAB — URINALYSIS, COMPLETE (UACMP) WITH MICROSCOPIC
Glucose, UA: NEGATIVE mg/dL
Hgb urine dipstick: NEGATIVE
Ketones, ur: 5 mg/dL — AB
Nitrite: NEGATIVE
Protein, ur: 30 mg/dL — AB
Specific Gravity, Urine: 1.025 (ref 1.005–1.030)
pH: 6 (ref 5.0–8.0)

## 2019-08-04 LAB — HEPATIC FUNCTION PANEL
ALT: 292 U/L — ABNORMAL HIGH (ref 0–44)
AST: 424 U/L — ABNORMAL HIGH (ref 15–41)
Albumin: 3.9 g/dL (ref 3.5–5.0)
Alkaline Phosphatase: 312 U/L — ABNORMAL HIGH (ref 38–126)
Bilirubin, Direct: 1.6 mg/dL — ABNORMAL HIGH (ref 0.0–0.2)
Indirect Bilirubin: 1 mg/dL — ABNORMAL HIGH (ref 0.3–0.9)
Total Bilirubin: 2.6 mg/dL — ABNORMAL HIGH (ref 0.3–1.2)
Total Protein: 6.7 g/dL (ref 6.5–8.1)

## 2019-08-04 LAB — BASIC METABOLIC PANEL
Anion gap: 14 (ref 5–15)
BUN: 18 mg/dL (ref 8–23)
CO2: 22 mmol/L (ref 22–32)
Calcium: 8.5 mg/dL — ABNORMAL LOW (ref 8.9–10.3)
Chloride: 106 mmol/L (ref 98–111)
Creatinine, Ser: 0.53 mg/dL (ref 0.44–1.00)
GFR calc Af Amer: >60 mL/min (ref >60)
GFR calc non Af Amer: >60 mL/min (ref >60)
Glucose, Bld: 118 mg/dL — ABNORMAL HIGH (ref 70–99)
Potassium: 3.5 mmol/L (ref 3.5–5.1)
Sodium: 142 mmol/L (ref 135–145)

## 2019-08-04 LAB — TROPONIN I (HIGH SENSITIVITY): Troponin I (High Sensitivity): 8 ng/L (ref <18)

## 2019-08-04 LAB — SARS CORONAVIRUS 2 BY RT PCR (HOSPITAL ORDER, PERFORMED IN ~~LOC~~ HOSPITAL LAB): SARS Coronavirus 2: NEGATIVE

## 2019-08-04 LAB — LIPASE, BLOOD: Lipase: 160 U/L — ABNORMAL HIGH (ref 11–51)

## 2019-08-04 MED ORDER — SODIUM CHLORIDE 0.9 % IV SOLN
2.0000 g | Freq: Once | INTRAVENOUS | Status: AC
  Administered 2019-08-05: 2 g via INTRAVENOUS
  Filled 2019-08-04: qty 20

## 2019-08-04 MED ORDER — SODIUM CHLORIDE 0.9 % IV BOLUS
500.0000 mL | Freq: Once | INTRAVENOUS | Status: AC
  Administered 2019-08-04: 22:00:00 500 mL via INTRAVENOUS

## 2019-08-04 MED ORDER — FENTANYL CITRATE (PF) 100 MCG/2ML IJ SOLN
25.0000 ug | Freq: Once | INTRAMUSCULAR | Status: AC
  Administered 2019-08-05: 25 ug via INTRAVENOUS
  Filled 2019-08-04: qty 2

## 2019-08-04 MED ORDER — ONDANSETRON HCL 4 MG/2ML IJ SOLN
4.0000 mg | Freq: Once | INTRAMUSCULAR | Status: AC
  Administered 2019-08-04: 22:00:00 4 mg via INTRAVENOUS
  Filled 2019-08-04: qty 2

## 2019-08-04 MED ORDER — METRONIDAZOLE IN NACL 5-0.79 MG/ML-% IV SOLN
500.0000 mg | Freq: Once | INTRAVENOUS | Status: AC
  Administered 2019-08-05: 01:00:00 500 mg via INTRAVENOUS
  Filled 2019-08-04: qty 100

## 2019-08-04 MED ORDER — ONDANSETRON HCL 4 MG/2ML IJ SOLN: 4.0000 mg | Freq: Once | INTRAMUSCULAR | Status: DC

## 2019-08-04 NOTE — ED Triage Notes (Signed)
EMS pt to rm 15 from home with report of abd pain this evening and nausea. No vomiting.

## 2019-08-04 NOTE — ED Notes (Signed)
Patient assisted to the bathroom 

## 2019-08-04 NOTE — ED Provider Notes (Signed)
[facility] Emergency Department Provider Note  ____________________________________________  Time seen: Approximately 8:42 PM  I have reviewed the triage vital signs and the nursing notes.   HISTORY  Chief Complaint Nausea and Abdominal Pain    HPI Tanya Cole is a 85 y.o. female presents to the emergency department for evaluation of nausea and RUQ abdominal pain today.  Patient states that she did have an episode of vomiting yesterday but was not having any pain.  She does have some occasional shortness of breath but has COPD.  No known fevers. No recent illness. She has had a hysterectomy and a hernia repair.  No cough, chest pain, urinary symptoms.   Past Medical History:  Diagnosis Date  . Aortic insufficiency    a. TTE 12/19: EF 55-60%, probable HK of the mid apical anterior septal myocardium, Gr1DD, mild AI, mildly dilated LA  . Asthma   . CAD (coronary artery disease)    a. NSTEMI 12/19; b. LHC 10/08/18: LM minimal luminal irregs, mLAD-1 95% s/p PCI/DES, mLAD-2 60%, LCx mild diffuse disease throughout, RCA minimal luminal irregs  . Diabetes mellitus ([facility])   . Hypercholesterolemia   . Hypertension   . Osteopenia   . Polymyalgia rheumatica syndrome ([facility])   . Reactive airway disease     Patient Active Problem List   Diagnosis Date Noted  . Respiratory illness 06/18/2019  . CAD (coronary artery disease) 12/29/2018  . Non-ST elevation (NSTEMI) myocardial infarction ([facility])   . Chest pain 10/07/2018  . Memory change 05/27/2018  . Osteoporosis 02/05/2018  . Weight loss 06/24/2017  . Abdominal pain, left lower quadrant 10/05/2016  . Long term current use of systemic steroids 05/16/2016  . Elevated erythrocyte sedimentation rate 05/04/2016  . Back pain 04/29/2016  . Fatigue 05/24/2015  . Health care maintenance 01/25/2015  . UTI (urinary tract infection) 07/07/2014  . Neuropathy 03/24/2014  . Diverticulitis 02/24/2013  . Reactive airway  disease 09/15/2012  . Hypertension 09/14/2012  . Hypercholesterolemia 09/14/2012  . Diabetes mellitus with cardiac complication ([facility]) 09/14/2012    Past Surgical History:  Procedure Laterality Date  . ABDOMINAL HYSTERECTOMY  1981   prolapse and bleeding, ovaries not removed  . BREAST EXCISIONAL BIOPSY Right   . CORONARY STENT INTERVENTION N/A 10/08/2018   Procedure: CORONARY STENT INTERVENTION;  Surgeon: Iran Ouch, MD;  Location: [facility];  Service: Cardiovascular;  Laterality: N/A;  . LEFT HEART CATH AND CORONARY ANGIOGRAPHY N/A 10/08/2018   Procedure: LEFT HEART CATH AND CORONARY ANGIOGRAPHY;  Surgeon: Iran Ouch, MD;  Location: [facility];  Service: Cardiovascular;  Laterality: N/A;  . UMBILICAL HERNIA REPAIR  7/94    Prior to Admission medications   Medication Sig Start Date End Date Taking? Authorizing Provider  ADVAIR DISKUS 250-50 MCG/DOSE AEPB INHALE ONE PUFF BY MOUTH EVERY 12 HOURS. RINSE MOUTH AFTER EACH USE 02/17/16   Dale Garrison, MD  alendronate (FOSAMAX) 70 MG tablet 70 mg once a week.  11/06/17   [provider]  aspirin 81 MG tablet Take 81 mg by mouth daily.    [provider]  atorvastatin (LIPITOR) 40 MG tablet Take 1 tablet (40 mg total) by mouth daily at 6 PM. 10/18/18   Dunn, Raymon Mutton, PA-C  clopidogrel (PLAVIX) 75 MG tablet Take 1 tablet (75 mg total) by mouth daily with breakfast. 10/18/18   Dunn, Raymon Mutton, PA-C  cyanocobalamin (,VITAMIN B-12,) 1000 MCG/ML injection Inject into the muscle.    [provider]  fluticasone (FLONASE) 50 MCG/ACT nasal spray USE 2 SPRAY(S) IN EACH NOSTRIL ONCE DAILY Patient taking differently: Place 1 spray into both nostrils daily.  08/27/18   Einar Pheasant, MD  isosorbide mononitrate (IMDUR) 30 MG 24 hr tablet Take 1 tablet (30 mg total) by mouth daily. 12/16/18   Hillary Bow, MD  lisinopril (ZESTRIL) 20 MG tablet Take 1 tablet (20 mg total) by mouth daily. 04/15/19   Dunn,  Areta Haber, PA-C  metFORMIN (GLUCOPHAGE) 500 MG tablet TAKE 1 TABLET BY MOUTH TWICE DAILY WITH A MEAL 05/15/19   Einar Pheasant, MD  metoprolol tartrate (LOPRESSOR) 25 MG tablet Take 1 tablet (25 mg total) by mouth 2 (two) times daily. 11/27/18   Rise Mu, PA-C  Multiple Vitamins-Minerals (VISION FORMULA/LUTEIN PO) Take by mouth daily.    [provider]  nitroGLYCERIN (NITROSTAT) 0.4 MG SL tablet Place 1 tablet (0.4 mg total) under the tongue every 5 (five) minutes as needed for chest pain. 10/09/18   Bettey Costa, MD  predniSONE (DELTASONE) 20 MG tablet Take 2 tablets (40 mg total) by mouth daily with breakfast. 06/18/19   Leone Haven, MD  PROAIR HFA 108 [phone] Base) MCG/ACT inhaler Inhale 2 puffs into the lungs every 6 (six) hours as needed. 05/28/19   Einar Pheasant, MD  Vitamin D, Ergocalciferol, (DRISDOL) 50000 units CAPS capsule Take 50,000 Units by mouth once a week. 06/24/18   [provider]    Allergies Tramadol  Family History  Problem Relation Age of Onset  . Heart attack Father   . Arthritis Mother   . Heart disease Mother   . Throat cancer Sister   . Parkinson's disease Sister   . COPD Brother     Social History Social History   Tobacco Use  . Smoking status: Never Smoker  . Smokeless tobacco: Never Used  Substance Use Topics  . Alcohol use: No    Alcohol/week: 0.0 standard drinks  . Drug use: No     Review of Systems  Constitutional: No fever/chills ENT: No upper respiratory complaints Cardiovascular: No chest pain. Respiratory: No cough.  Gastrointestinal: Positive for abdominal pain and nausea. No vomiting.  Genitourinary: Negative for dysuria. Musculoskeletal: Negative for musculoskeletal pain. Skin: Negative for rash, abrasions, lacerations, ecchymosis. Neurological: Negative for headaches   ____________________________________________   PHYSICAL EXAM:  VITAL SIGNS: ED Triage Vitals  Enc Vitals Group     BP 08/04/19 2030  (!) 152/67     Pulse Rate 08/04/19 2030 (!) 101     Resp 08/04/19 2030 (!) 30     Temp 08/04/19 2030 99.8 F (37.7 C)     Temp Source 08/04/19 2030 Oral     SpO2 08/04/19 2030 93 %     Weight 08/04/19 2032 120 lb (54.4 kg)     Height 08/04/19 2032 5\' 1"  (1.549 m)     Head Circumference --      Peak Flow --      Pain Score 08/04/19 2031 8     Pain Loc --      Pain Edu? --      Excl. in [place]? --      Constitutional: Alert and oriented. Well appearing and in no acute distress. Eyes: Conjunctivae are normal. PERRL. EOMI. Head: Atraumatic. ENT:      Ears:      Nose: No congestion/rhinnorhea.      Mouth/Throat: Mucous membranes are moist.  Neck: No stridor.  Cardiovascular: Normal rate, regular rhythm.  Good peripheral circulation. Respiratory: Normal respiratory effort without tachypnea or retractions. Lungs CTAB. Good air entry to the bases with no decreased or absent breath sounds. Gastrointestinal: Bowel sounds 4 quadrants.  Right upper quadrant tenderness. No guarding or rigidity. No palpable masses. No distention.  Musculoskeletal: Full range of motion to all extremities. No gross deformities appreciated. Neurologic:  Normal speech and language. No gross focal neurologic deficits are appreciated.  Skin:  Skin is warm, dry and intact. No rash noted. Psychiatric: Mood and affect are normal. Speech and behavior are normal. Patient exhibits appropriate insight and judgement.   ____________________________________________   LABS (all labs ordered are listed, but only abnormal results are displayed)  Labs Reviewed - No data to display ____________________________________________  EKG   ____________________________________________  RADIOLOGY   No results found.  ____________________________________________    PROCEDURES  Procedure(s) performed:    Procedures    Medications - No data to display   ____________________________________________   INITIAL  IMPRESSION / ASSESSMENT AND PLAN / ED COURSE  Pertinent labs & imaging results that were available during my care of the patient were reviewed by me and considered in my medical decision making (see chart for details).  Review of the [place] CSRS was performed in accordance of the [facility] prior to dispensing any controlled drugs.     Patient presented the emergency department for evaluation of right upper quadrant discomfort.  Lab work, urinalysis, chest x-ray were ordered.  Report was given to Dr. Scotty Court for follow-up of blood work and imaging and disposition.    [name] Tanya Cole was evaluated in Emergency Department on 08/04/2019 for the symptoms described in the history of present illness. She was evaluated in the context of the global COVID-19 pandemic, which necessitated consideration that the patient might be at risk for infection with the SARS-CoV-2 virus that causes COVID-19. Institutional protocols and algorithms that pertain to the evaluation of patients at risk for COVID-19 are in a state of rapid change based on information released by regulatory bodies including the CDC and federal and state organizations. These policies and algorithms were followed during the patient's care in the ED.  ____________________________________________  FINAL CLINICAL IMPRESSION(S) / ED DIAGNOSES  Final diagnoses:  None      NEW MEDICATIONS STARTED DURING THIS VISIT:  ED Discharge Orders    None          This chart was dictated using voice recognition software/Dragon. Despite best efforts to proofread, errors can occur which can change the meaning. Any change was purely unintentional.    Enid Derry, PA-C 08/04/19 2149    Sharman Cheek, MD 08/04/19 Juliann Pares    Sharman Cheek, MD 08/04/19 [phone]

## 2019-08-05 ENCOUNTER — Inpatient Hospital Stay: Payer: Medicare HMO

## 2019-08-05 ENCOUNTER — Encounter: Payer: Self-pay | Admitting: *Deleted

## 2019-08-05 ENCOUNTER — Other Ambulatory Visit: Payer: Self-pay

## 2019-08-05 DIAGNOSIS — Z7982 Long term (current) use of aspirin: Secondary | ICD-10-CM | POA: Diagnosis not present

## 2019-08-05 DIAGNOSIS — Z90710 Acquired absence of both cervix and uterus: Secondary | ICD-10-CM | POA: Diagnosis not present

## 2019-08-05 DIAGNOSIS — Z79899 Other long term (current) drug therapy: Secondary | ICD-10-CM | POA: Diagnosis not present

## 2019-08-05 DIAGNOSIS — K5730 Diverticulosis of large intestine without perforation or abscess without bleeding: Secondary | ICD-10-CM | POA: Diagnosis not present

## 2019-08-05 DIAGNOSIS — K824 Cholesterolosis of gallbladder: Secondary | ICD-10-CM | POA: Diagnosis not present

## 2019-08-05 DIAGNOSIS — N390 Urinary tract infection, site not specified: Secondary | ICD-10-CM | POA: Diagnosis present

## 2019-08-05 DIAGNOSIS — I2510 Atherosclerotic heart disease of native coronary artery without angina pectoris: Secondary | ICD-10-CM | POA: Diagnosis present

## 2019-08-05 DIAGNOSIS — Z955 Presence of coronary angioplasty implant and graft: Secondary | ICD-10-CM | POA: Diagnosis not present

## 2019-08-05 DIAGNOSIS — Z7984 Long term (current) use of oral hypoglycemic drugs: Secondary | ICD-10-CM | POA: Diagnosis not present

## 2019-08-05 DIAGNOSIS — E876 Hypokalemia: Secondary | ICD-10-CM | POA: Diagnosis present

## 2019-08-05 DIAGNOSIS — J449 Chronic obstructive pulmonary disease, unspecified: Secondary | ICD-10-CM | POA: Diagnosis present

## 2019-08-05 DIAGNOSIS — B961 Klebsiella pneumoniae [K. pneumoniae] as the cause of diseases classified elsewhere: Secondary | ICD-10-CM | POA: Diagnosis present

## 2019-08-05 DIAGNOSIS — K8050 Calculus of bile duct without cholangitis or cholecystitis without obstruction: Secondary | ICD-10-CM

## 2019-08-05 DIAGNOSIS — Z66 Do not resuscitate: Secondary | ICD-10-CM | POA: Diagnosis present

## 2019-08-05 DIAGNOSIS — Z7951 Long term (current) use of inhaled steroids: Secondary | ICD-10-CM | POA: Diagnosis not present

## 2019-08-05 DIAGNOSIS — Z7902 Long term (current) use of antithrombotics/antiplatelets: Secondary | ICD-10-CM | POA: Diagnosis not present

## 2019-08-05 DIAGNOSIS — E119 Type 2 diabetes mellitus without complications: Secondary | ICD-10-CM | POA: Diagnosis present

## 2019-08-05 DIAGNOSIS — I252 Old myocardial infarction: Secondary | ICD-10-CM | POA: Diagnosis not present

## 2019-08-05 DIAGNOSIS — R7881 Bacteremia: Secondary | ICD-10-CM | POA: Diagnosis present

## 2019-08-05 DIAGNOSIS — I959 Hypotension, unspecified: Secondary | ICD-10-CM | POA: Diagnosis present

## 2019-08-05 DIAGNOSIS — K819 Cholecystitis, unspecified: Secondary | ICD-10-CM | POA: Diagnosis present

## 2019-08-05 DIAGNOSIS — K8020 Calculus of gallbladder without cholecystitis without obstruction: Secondary | ICD-10-CM | POA: Diagnosis not present

## 2019-08-05 DIAGNOSIS — Z20828 Contact with and (suspected) exposure to other viral communicable diseases: Secondary | ICD-10-CM | POA: Diagnosis present

## 2019-08-05 DIAGNOSIS — K8062 Calculus of gallbladder and bile duct with acute cholecystitis without obstruction: Secondary | ICD-10-CM | POA: Diagnosis present

## 2019-08-05 DIAGNOSIS — R7401 Elevation of levels of liver transaminase levels: Secondary | ICD-10-CM | POA: Diagnosis not present

## 2019-08-05 DIAGNOSIS — Z7952 Long term (current) use of systemic steroids: Secondary | ICD-10-CM | POA: Diagnosis not present

## 2019-08-05 DIAGNOSIS — I10 Essential (primary) hypertension: Secondary | ICD-10-CM | POA: Diagnosis present

## 2019-08-05 DIAGNOSIS — M353 Polymyalgia rheumatica: Secondary | ICD-10-CM | POA: Diagnosis present

## 2019-08-05 DIAGNOSIS — K810 Acute cholecystitis: Secondary | ICD-10-CM | POA: Diagnosis not present

## 2019-08-05 DIAGNOSIS — B9620 Unspecified Escherichia coli [E. coli] as the cause of diseases classified elsewhere: Secondary | ICD-10-CM | POA: Diagnosis present

## 2019-08-05 DIAGNOSIS — R1011 Right upper quadrant pain: Secondary | ICD-10-CM | POA: Diagnosis present

## 2019-08-05 DIAGNOSIS — E7800 Pure hypercholesterolemia, unspecified: Secondary | ICD-10-CM | POA: Diagnosis present

## 2019-08-05 LAB — HEPATIC FUNCTION PANEL
ALT: 294 U/L — ABNORMAL HIGH (ref 0–44)
AST: 433 U/L — ABNORMAL HIGH (ref 15–41)
Albumin: 4.1 g/dL (ref 3.5–5.0)
Alkaline Phosphatase: 317 U/L — ABNORMAL HIGH (ref 38–126)
Bilirubin, Direct: 1.5 mg/dL — ABNORMAL HIGH (ref 0.0–0.2)
Indirect Bilirubin: 1.3 mg/dL — ABNORMAL HIGH (ref 0.3–0.9)
Total Bilirubin: 2.8 mg/dL — ABNORMAL HIGH (ref 0.3–1.2)
Total Protein: 7 g/dL (ref 6.5–8.1)

## 2019-08-05 LAB — BLOOD CULTURE ID PANEL (REFLEXED)

## 2019-08-05 LAB — TSH: TSH: 2.326 u[IU]/mL (ref 0.350–4.500)

## 2019-08-05 LAB — LACTIC ACID, PLASMA: Lactic Acid, Venous: 2.2 mmol/L (ref 0.5–1.9)

## 2019-08-05 MED ORDER — PIPERACILLIN-TAZOBACTAM 3.375 G IVPB
3.3750 g | Freq: Three times a day (TID) | INTRAVENOUS | Status: DC
  Administered 2019-08-05 – 2019-08-07 (×6): 3.375 g via INTRAVENOUS
  Filled 2019-08-05 (×5): qty 50

## 2019-08-05 MED ORDER — ONDANSETRON HCL 4 MG/2ML IJ SOLN: 4.0000 mg | Freq: Four times a day (QID) | INTRAMUSCULAR | Status: DC | PRN

## 2019-08-05 MED ORDER — PROMETHAZINE HCL 25 MG/ML IJ SOLN: 12.5000 mg | Freq: Once | INTRAMUSCULAR | Status: DC

## 2019-08-05 MED ORDER — SODIUM CHLORIDE 0.9 % IV SOLN
INTRAVENOUS | Status: DC | PRN
  Administered 2019-08-05: 250 mL via INTRAVENOUS
  Administered 2019-08-06 (×2): 10 mL via INTRAVENOUS

## 2019-08-05 MED ORDER — MOMETASONE FURO-FORMOTEROL FUM 200-5 MCG/ACT IN AERO
2.0000 | INHALATION_SPRAY | Freq: Two times a day (BID) | RESPIRATORY_TRACT | Status: DC
  Administered 2019-08-05 – 2019-08-09 (×9): 2 via RESPIRATORY_TRACT
  Filled 2019-08-05: qty 8.8

## 2019-08-05 MED ORDER — HEPARIN SODIUM (PORCINE) 5000 UNIT/ML IJ SOLN
5000.0000 [IU] | Freq: Three times a day (TID) | INTRAMUSCULAR | Status: DC
  Administered 2019-08-05 – 2019-08-07 (×9): 5000 [IU] via SUBCUTANEOUS
  Filled 2019-08-05 (×9): qty 1

## 2019-08-05 MED ORDER — METOPROLOL TARTRATE 25 MG PO TABS
12.5000 mg | ORAL_TABLET | Freq: Two times a day (BID) | ORAL | Status: DC
  Filled 2019-08-05: qty 1

## 2019-08-05 MED ORDER — GADOBUTROL 1 MMOL/ML IV SOLN
5.0000 mL | Freq: Once | INTRAVENOUS | Status: AC | PRN
  Administered 2019-08-05: 12:00:00 5 mL via INTRAVENOUS

## 2019-08-05 MED ORDER — SODIUM CHLORIDE 0.9 % IV SOLN
INTRAVENOUS | Status: DC
  Administered 2019-08-05 (×3): via INTRAVENOUS

## 2019-08-05 MED ORDER — ALBUTEROL SULFATE (2.5 MG/3ML) 0.083% IN NEBU: 2.5000 mg | INHALATION_SOLUTION | RESPIRATORY_TRACT | Status: DC | PRN

## 2019-08-05 MED ORDER — ACETAMINOPHEN 325 MG PO TABS
650.0000 mg | ORAL_TABLET | Freq: Four times a day (QID) | ORAL | Status: DC | PRN
  Administered 2019-08-05 – 2019-08-09 (×3): 650 mg via ORAL
  Filled 2019-08-05 (×3): qty 2

## 2019-08-05 MED ORDER — ISOSORBIDE MONONITRATE ER 30 MG PO TB24
30.0000 mg | ORAL_TABLET | Freq: Every day | ORAL | Status: DC
  Administered 2019-08-05: 30 mg via ORAL
  Filled 2019-08-05: qty 1

## 2019-08-05 MED ORDER — ONDANSETRON HCL 4 MG PO TABS
4.0000 mg | ORAL_TABLET | Freq: Four times a day (QID) | ORAL | Status: DC | PRN
  Administered 2019-08-05 (×2): 4 mg via ORAL
  Filled 2019-08-05 (×2): qty 1

## 2019-08-05 MED ORDER — NITROGLYCERIN 0.4 MG SL SUBL: 0.4000 mg | SUBLINGUAL_TABLET | SUBLINGUAL | Status: DC | PRN

## 2019-08-05 MED ORDER — DOCUSATE SODIUM 100 MG PO CAPS
100.0000 mg | ORAL_CAPSULE | Freq: Two times a day (BID) | ORAL | Status: DC
  Administered 2019-08-05 – 2019-08-08 (×7): 100 mg via ORAL
  Filled 2019-08-05 (×9): qty 1

## 2019-08-05 MED ORDER — ACETAMINOPHEN 650 MG RE SUPP: 650.0000 mg | Freq: Four times a day (QID) | RECTAL | Status: DC | PRN

## 2019-08-05 MED ORDER — MORPHINE SULFATE (PF) 2 MG/ML IV SOLN
1.0000 mg | INTRAVENOUS | Status: DC | PRN
  Administered 2019-08-05 – 2019-08-08 (×2): 2 mg via INTRAVENOUS
  Filled 2019-08-05 (×2): qty 1

## 2019-08-05 NOTE — Progress Notes (Addendum)
Pt BP was 103/53. Notified Dr. Estanislado Pandy, was told to hold the Metoprolol that was scheduled.

## 2019-08-05 NOTE — Progress Notes (Signed)
PHARMACY - PHYSICIAN COMMUNICATION CRITICAL VALUE ALERT - BLOOD CULTURE IDENTIFICATION (BCID)  Results for orders placed or performed during the hospital encounter of 08/04/19  Blood Culture ID Panel (Reflexed) (Collected: 08/04/2019 10:32 PM)  Result Value Ref Range   Enterococcus species NOT DETECTED NOT DETECTED   Listeria monocytogenes NOT DETECTED NOT DETECTED   Staphylococcus species NOT DETECTED NOT DETECTED   Staphylococcus aureus (BCID) NOT DETECTED NOT DETECTED   Streptococcus species NOT DETECTED NOT DETECTED   Streptococcus agalactiae NOT DETECTED NOT DETECTED   Streptococcus pneumoniae NOT DETECTED NOT DETECTED   Streptococcus pyogenes NOT DETECTED NOT DETECTED   Acinetobacter baumannii NOT DETECTED NOT DETECTED   Enterobacteriaceae species DETECTED (A) NOT DETECTED   Enterobacter cloacae complex NOT DETECTED NOT DETECTED   Escherichia coli NOT DETECTED NOT DETECTED   Klebsiella oxytoca NOT DETECTED NOT DETECTED   Klebsiella pneumoniae DETECTED (A) NOT DETECTED   Proteus species NOT DETECTED NOT DETECTED   Serratia marcescens NOT DETECTED NOT DETECTED   Carbapenem resistance NOT DETECTED NOT DETECTED   Haemophilus influenzae NOT DETECTED NOT DETECTED   Neisseria meningitidis NOT DETECTED NOT DETECTED   Pseudomonas aeruginosa NOT DETECTED NOT DETECTED   Candida albicans NOT DETECTED NOT DETECTED   Candida glabrata NOT DETECTED NOT DETECTED   Candida krusei NOT DETECTED NOT DETECTED   Candida parapsilosis NOT DETECTED NOT DETECTED   Candida tropicalis NOT DETECTED NOT DETECTED   2/4 Klebsiella pneumoniae  Name of physician (or Provider) Contacted: [place]  Changes to prescribed antibiotics required: no changes required  Dallie Piles, PharmD 08/05/2019  1:26 PM

## 2019-08-05 NOTE — Consult Note (Signed)
Pharmacy Antibiotic Note  Tanya Cole is a 85 y.o. female admitted on 08/04/2019 with cholecystitis and possible possible choledocholithiasis .  Pharmacy has been consulted for Zosyn dosing. She received a single dose of ceftriaxone and metronidazole in the ED, LA 2.2 and trending up slightly, no leukocytosis, fevers or renal dysfunction  Plan: Start Zosyn 3.375g IV q8h (4 hour infusion).  Height: 5\' 1"  (154.9 cm) Weight: 115 lb 12.8 oz (52.5 kg) IBW/kg (Calculated) : 47.8  Temp (24hrs), Avg:99 F (37.2 C), Min:98.3 F (36.8 C), Max:99.8 F (37.7 C)  Recent Labs  Lab 08/04/19 2050 08/04/19 2132 08/04/19 2232 08/05/19 0113  WBC 9.9  --   --   --   CREATININE  --  0.53  --   --   LATICACIDVEN  --   --  2.1* 2.2*    Estimated Creatinine Clearance: 39.5 mL/min (by C-G formula based on SCr of 0.53 mg/dL).     Antimicrobials this admission: Zosyn 10/5 >>   Microbiology results: 10/4 BCx: NGTD 10/4 UCx: pending  10/4 SARS CoV-2: negative   Thank you for allowing pharmacy to be a part of this patient's care.  Dallie Piles, PharmD 08/05/2019 9:31 AM

## 2019-08-05 NOTE — H&P (Addendum)
Tanya Cole is an 85 y.o. female.   Chief Complaint: Abdominal pain HPI: The patient with past medical history of CAD, diabetes, hypertension, asthma and polymyalgia rheumatica presents to the emergency department complaining of abdominal pain.  The patient reports that her pain began 3 days ago but seemed to ease off.  She had one episode of nonbloody nonbilious emesis at the onset of pain but has not had vomiting since that time.  However, she does admit to some nausea.  The pain radiates across her upper abdomen and epigastric region.  Ultrasound of her right upper quadrant revealed thickened gallbladder consistent with cholecystitis.  Common bile duct was also dilated.  Laboratory evaluation significant for elevated liver enzymes.  The patient received external metronidazole prior to the emergency department staff calling the hospitalist service for admission.  Past Medical History:  Diagnosis Date  . Aortic insufficiency    a. TTE 12/19: EF 55-60%, probable HK of the mid apical anterior septal myocardium, Gr1DD, mild AI, mildly dilated LA  . Asthma   . CAD (coronary artery disease)    a. NSTEMI 12/19; b. LHC 10/08/18: LM minimal luminal irregs, mLAD-1 95% s/p PCI/DES, mLAD-2 60%, LCx mild diffuse disease throughout, RCA minimal luminal irregs  . Diabetes mellitus (HCC)   . Hypercholesterolemia   . Hypertension   . Osteopenia   . Polymyalgia rheumatica syndrome (HCC)   . Reactive airway disease     Past Surgical History:  Procedure Laterality Date  . ABDOMINAL HYSTERECTOMY  1981   prolapse and bleeding, ovaries not removed  . BREAST EXCISIONAL BIOPSY Right   . CORONARY STENT INTERVENTION N/A 10/08/2018   Procedure: CORONARY STENT INTERVENTION;  Surgeon: Iran OuchArida, Muhammad A, MD;  Location: [facility] INVASIVE CV LAB;  Service: Cardiovascular;  Laterality: N/A;  . LEFT HEART CATH AND CORONARY ANGIOGRAPHY N/A 10/08/2018   Procedure: LEFT HEART CATH AND CORONARY ANGIOGRAPHY;  Surgeon: Iran OuchArida,  Muhammad A, MD;  Location: [facility] INVASIVE CV LAB;  Service: Cardiovascular;  Laterality: N/A;  . UMBILICAL HERNIA REPAIR  7/94    Family History  Problem Relation Age of Onset  . Heart attack Father   . Arthritis Mother   . Heart disease Mother   . Throat cancer Sister   . Parkinson's disease Sister   . COPD Brother    Social History:  reports that she has never smoked. She has never used smokeless tobacco. She reports that she does not drink alcohol or use drugs.  Allergies:  Allergies  Allergen Reactions  . Tramadol Itching and Nausea And Vomiting    Medications Prior to Admission  Medication Sig Dispense Refill  . ADVAIR DISKUS 250-50 MCG/DOSE AEPB INHALE ONE PUFF BY MOUTH EVERY 12 HOURS. RINSE MOUTH AFTER EACH USE (Patient taking differently: Inhale 1 puff into the lungs 2 (two) times daily. ) 60 each 5  . atorvastatin (LIPITOR) 40 MG tablet Take 1 tablet (40 mg total) by mouth daily at 6 PM. 90 tablet 3  . clopidogrel (PLAVIX) 75 MG tablet Take 1 tablet (75 mg total) by mouth daily with breakfast. 90 tablet 3  . metoprolol tartrate (LOPRESSOR) 25 MG tablet Take 1 tablet (25 mg total) by mouth 2 (two) times daily. (Patient taking differently: Take 12.5 mg by mouth 2 (two) times daily. ) 90 tablet 3  . PROAIR HFA 108 (90 Base) MCG/ACT inhaler Inhale 2 puffs into the lungs every 6 (six) hours as needed. (Patient taking differently: Inhale 2 puffs into the lungs every 6 (six)  hours as needed for wheezing or shortness of breath. ) 18 g 2  . alendronate (FOSAMAX) 70 MG tablet Take 70 mg by mouth once a week.     Marland Kitchen aspirin 81 MG tablet Take 81 mg by mouth daily.    . cyanocobalamin (,VITAMIN B-12,) 1000 MCG/ML injection Inject 1,000 mcg into the muscle every 30 (thirty) days.     . fluticasone (FLONASE) 50 MCG/ACT nasal spray USE 2 SPRAY(S) IN EACH NOSTRIL ONCE DAILY (Patient taking differently: Place 1 spray into both nostrils daily. ) 16 g 3  . isosorbide mononitrate (IMDUR) 30 MG 24  hr tablet Take 1 tablet (30 mg total) by mouth daily. 30 tablet 0  . lisinopril (ZESTRIL) 20 MG tablet Take 1 tablet (20 mg total) by mouth daily. 90 tablet 3  . metFORMIN (GLUCOPHAGE) 500 MG tablet TAKE 1 TABLET BY MOUTH TWICE DAILY WITH A MEAL (Patient taking differently: Take 500 mg by mouth 2 (two) times daily with a meal. ) 180 tablet 1  . Multiple Vitamins-Minerals (VISION FORMULA/LUTEIN PO) Take by mouth daily.    . nitroGLYCERIN (NITROSTAT) 0.4 MG SL tablet Place 1 tablet (0.4 mg total) under the tongue every 5 (five) minutes as needed for chest pain. 30 tablet 12  . Vitamin D, Ergocalciferol, (DRISDOL) 50000 units CAPS capsule Take 50,000 Units by mouth once a week.  3    Results for orders placed or performed during the hospital encounter of 08/04/19 (from the past 48 hour(s))  CBC with Differential     Status: Abnormal   Collection Time: 08/04/19  8:50 PM  Result Value Ref Range   WBC 9.9 4.0 - 10.5 K/uL   RBC 4.56 3.87 - 5.11 MIL/uL   Hemoglobin 12.8 12.0 - 15.0 g/dL   HCT 39.7 36.0 - 46.0 %   MCV 87.1 80.0 - 100.0 fL   MCH 28.1 26.0 - 34.0 pg   MCHC 32.2 30.0 - 36.0 g/dL   RDW 14.6 11.5 - 15.5 %   Platelets 256 150 - 400 K/uL   nRBC 0.0 0.0 - 0.2 %   Neutrophils Relative % 92 %   Neutro Abs 9.0 (H) 1.7 - 7.7 K/uL   Lymphocytes Relative 3 %   Lymphs Abs 0.3 (L) 0.7 - 4.0 K/uL   Monocytes Relative 1 %   Monocytes Absolute 0.1 0.1 - 1.0 K/uL   Eosinophils Relative 4 %   Eosinophils Absolute 0.4 0.0 - 0.5 K/uL   Basophils Relative 0 %   Basophils Absolute 0.0 0.0 - 0.1 K/uL   Immature Granulocytes 0 %   Abs Immature Granulocytes 0.03 0.00 - 0.07 K/uL    Comment: Performed at [facility], [address] [place],  [address]  Lipase, blood     Status: Abnormal   Collection Time: 08/04/19  8:50 PM  Result Value Ref Range   Lipase 160 (H) 11 - 51 U/L    Comment: Performed at [facility], [place], [name] [address]  Troponin I  (High Sensitivity)     Status: None   Collection Time: 08/04/19  8:50 PM  Result Value Ref Range   Troponin I (High Sensitivity) 8 <18 ng/L    Comment: (NOTE) Elevated high sensitivity troponin I (hsTnI) values and significant  changes across serial measurements may suggest ACS but many other  chronic and acute conditions are known to elevate hsTnI results.  Refer to the "Links" section for chest pain algorithms and additional  guidance.  Performed at [facility], [address] [place], [place] [address]   Hepatic function panel     Status: Abnormal   Collection Time: 08/04/19  9:32 PM  Result Value Ref Range   Total Protein 6.7 6.5 - 8.1 g/dL   Albumin 3.9 3.5 - 5.0 g/dL   AST 038 (H) 15 - 41 U/L   ALT 292 (H) 0 - 44 U/L   Alkaline Phosphatase 312 (H) 38 - 126 U/L   Total Bilirubin 2.6 (H) 0.3 - 1.2 mg/dL   Bilirubin, Direct 1.6 (H) 0.0 - 0.2 mg/dL   Indirect Bilirubin 1.0 (H) 0.3 - 0.9 mg/dL    Comment: Performed at [facility], [address] [place], [place] [address]  Basic metabolic panel     Status: Abnormal   Collection Time: 08/04/19  9:32 PM  Result Value Ref Range   Sodium 142 135 - 145 mmol/L   Potassium 3.5 3.5 - 5.1 mmol/L   Chloride 106 98 - 111 mmol/L   CO2 22 22 - 32 mmol/L   Glucose, Bld 118 (H) 70 - 99 mg/dL   BUN 18 8 - 23 mg/dL   Creatinine, Ser 2.91 0.44 - 1.00 mg/dL   Calcium 8.5 (L) 8.9 - 10.3 mg/dL   GFR calc non Af Amer >60 >60 mL/min   GFR calc Af Amer >60 >60 mL/min   Anion gap 14 5 - 15    Comment: Performed at [facility], [address] [place], [place] [address]  TSH     Status: None   Collection Time: 08/04/19  9:32 PM  Result Value Ref Range   TSH 2.326 0.350 - 4.500 uIU/mL    Comment: Performed by a 3rd Generation assay with a functional sensitivity of <=0.01 uIU/mL. Performed at [facility], [address] [place], [place] [address]   Lactic acid, plasma     Status: Abnormal    Collection Time: 08/04/19 10:32 PM  Result Value Ref Range   Lactic Acid, Venous 2.1 (HH) 0.5 - 1.9 mmol/L    Comment: CRITICAL RESULT CALLED TO, READ BACK BY AND VERIFIED WITH LEIGH FERGUSON ON 08/04/19 AT 2348 [facility] Performed at [facility], [address] [place], [place] [address]   Urinalysis, Complete w Microscopic     Status: Abnormal   Collection Time: 08/04/19 10:32 PM  Result Value Ref Range   Color, Urine AMBER (A) YELLOW    Comment: BIOCHEMICALS MAY BE AFFECTED BY COLOR   APPearance CLEAR (A) CLEAR   Specific Gravity, Urine 1.025 1.005 - 1.030   pH 6.0 5.0 - 8.0   Glucose, UA NEGATIVE NEGATIVE mg/dL   Hgb urine dipstick NEGATIVE NEGATIVE   Bilirubin Urine SMALL (A) NEGATIVE   Ketones, ur 5 (A) NEGATIVE mg/dL   Protein, ur 30 (A) NEGATIVE mg/dL   Nitrite NEGATIVE NEGATIVE   Leukocytes,Ua SMALL (A) NEGATIVE   RBC / HPF 0-5 0 - 5 RBC/hpf   WBC, UA 11-20 0 - 5 WBC/hpf   Bacteria, UA MANY (A) NONE SEEN   Squamous Epithelial / LPF 0-5 0 - 5   Mucus PRESENT    Hyaline Casts, UA PRESENT     Comment: Performed at [facility], [address], [place], [place] [address]  SARS Coronavirus 2 [facility] order, Performed in [facility] hospital lab) Nasopharyngeal     Status: None   Collection Time: 08/04/19 10:32 PM   Specimen: Nasopharyngeal  Result Value Ref Range  SARS Coronavirus 2 NEGATIVE NEGATIVE    Comment: (NOTE) If result is NEGATIVE SARS-CoV-2 target nucleic acids are NOT DETECTED. The SARS-CoV-2 RNA is generally detectable in upper and lower  respiratory specimens during the acute phase of infection. The lowest  concentration of SARS-CoV-2 viral copies this assay can detect is 250  copies / mL. A negative result does not preclude SARS-CoV-2 infection  and should not be used as the sole basis for treatment or other  patient management decisions.  A negative result may occur with  improper specimen collection / handling, submission of specimen  other  than nasopharyngeal swab, presence of viral mutation(s) within the  areas targeted by this assay, and inadequate number of viral copies  (<250 copies / mL). A negative result must be combined with clinical  observations, patient history, and epidemiological information. If result is POSITIVE SARS-CoV-2 target nucleic acids are DETECTED. The SARS-CoV-2 RNA is generally detectable in upper and lower  respiratory specimens dur ing the acute phase of infection.  Positive  results are indicative of active infection with SARS-CoV-2.  Clinical  correlation with patient history and other diagnostic information is  necessary to determine patient infection status.  Positive results do  not rule out bacterial infection or co-infection with other viruses. If result is PRESUMPTIVE POSTIVE SARS-CoV-2 nucleic acids MAY BE PRESENT.   A presumptive positive result was obtained on the submitted specimen  and confirmed on repeat testing.  While 2019 novel coronavirus  (SARS-CoV-2) nucleic acids may be present in the submitted sample  additional confirmatory testing may be necessary for epidemiological  and / or clinical management purposes  to differentiate between  SARS-CoV-2 and other Sarbecovirus currently known to infect humans.  If clinically indicated additional testing with an alternate test  methodology [phone]) is advised. The SARS-CoV-2 RNA is generally  detectable in upper and lower respiratory sp ecimens during the acute  phase of infection. The expected result is Negative. Fact Sheet for Patients:  BoilerBrush.com.cy Fact Sheet for Healthcare Providers: https://pope.com/ This test is not yet approved or cleared by the [place] FDA and has been authorized for detection and/or diagnosis of SARS-CoV-2 by FDA under an Emergency Use Authorization (EUA).  This EUA will remain in effect (meaning this test can be used) for the duration  of the COVID-19 declaration under Section 564(b)(1) of the Act, 21 U.S.C. section 360bbb-3(b)(1), unless the authorization is terminated or revoked sooner. Performed at [facility], [address] [place], [place] [address]   Lactic acid, plasma     Status: Abnormal   Collection Time: 08/05/19  1:13 AM  Result Value Ref Range   Lactic Acid, Venous 2.2 (HH) 0.5 - 1.9 mmol/L    Comment: CRITICAL VALUE NOTED. VALUE IS CONSISTENT WITH PREVIOUSLY REPORTED/CALLED VALUE [facility] Performed at [facility], [address] [place], [place] [address]    Dg Chest 2 View  Result Date: 08/04/2019 CLINICAL DATA:  Fever nausea vomiting EXAM: CHEST - 2 VIEW COMPARISON:  12/14/2018 FINDINGS: Linear scarring or atelectasis at the left base. No focal consolidation or pleural effusion. Stable cardiomediastinal silhouette with aortic atherosclerosis. No pneumothorax. IMPRESSION: No active cardiopulmonary disease. Mild scarring or atelectasis at the left lung base. Electronically Signed   By: Jasmine Pang M.D.   On: 08/04/2019 21:22   US Abdomen Limited Ruq  Result Date: 08/04/2019 CLINICAL DATA:  Upper abdominal pain EXAM: ULTRASOUND ABDOMEN LIMITED RIGHT UPPER QUADRANT COMPARISON:  None. FINDINGS: Gallbladder: Fluid-filled and distended with layering  sludge and small gallbladder stones. The gallbladder wall is mildly prominent measuring 3.4-4 mm. The patient does have a sonographic Murphy sign. Common bile duct: Diameter: 7 mm Liver: No focal lesion identified. Within normal limits in parenchymal echogenicity. Portal vein is patent on color Doppler imaging with normal direction of blood flow towards the liver. Other: None. IMPRESSION: Findings which could be suggestive of acute cholecystitis as described above. Electronically Signed   By: Jonna ClarkBindu  Avutu M.D.   On: 08/04/2019 23:28    Review of Systems  Constitutional: Negative for chills and fever.  HENT: Negative for sore throat and tinnitus.    Eyes: Negative for blurred vision and redness.  Respiratory: Negative for cough and shortness of breath.   Cardiovascular: Negative for chest pain, palpitations, orthopnea and PND.  Gastrointestinal: Positive for abdominal pain and nausea. Negative for diarrhea and vomiting.  Genitourinary: Negative for dysuria, frequency and urgency.  Musculoskeletal: Negative for joint pain and myalgias.  Skin: Negative for rash.       No lesions  Neurological: Negative for speech change, focal weakness and weakness.  Endo/Heme/Allergies: Does not bruise/bleed easily.       No temperature intolerance  Psychiatric/Behavioral: Negative for depression and suicidal ideas.    Blood pressure (!) 100/55, pulse 84, temperature 98.3 F (36.8 C), temperature source Oral, resp. rate 20, height 5\' 1"  (1.549 m), weight 52.5 kg, SpO2 95 %. Physical Exam  Vitals reviewed. Constitutional: She is oriented to person, place, and time. She appears well-developed and well-nourished. No distress.  HENT:  Head: Normocephalic and atraumatic.  Mouth/Throat: Oropharynx is clear and moist.  Eyes: Pupils are equal, round, and reactive to light. Conjunctivae and EOM are normal. No scleral icterus.  Neck: Normal range of motion. Neck supple. No JVD present. No tracheal deviation present. No thyromegaly present.  Cardiovascular: Normal rate, regular rhythm and normal heart sounds. Exam reveals no gallop and no friction rub.  No murmur heard. Respiratory: Effort normal and breath sounds normal.  GI: Soft. Bowel sounds are normal. She exhibits no distension. There is no abdominal tenderness.  Genitourinary:    Genitourinary Comments: Deferred   Musculoskeletal: Normal range of motion.        General: No edema.  Lymphadenopathy:    She has no cervical adenopathy.  Neurological: She is alert and oriented to person, place, and time. No cranial nerve deficit. She exhibits normal muscle tone.  Skin: Skin is warm and dry. No rash  noted. No erythema.  Psychiatric: She has a normal mood and affect. Her behavior is normal. Judgment and thought content normal.     Assessment/Plan This is an 85 year old female admitted for cholecystitis. 1.  Cholecystitis: The patient does not meet sepsis criteria.  Antibiotics given to cool information.  Consult gastroenterology for possible MRCP.  The patient is n.p.o. 2.  Transaminitis: Concerning for possible choledocholithiasis.  Plan as above. 3.  CAD: Stable; held aspirin and Plavix for now.  (The patient is on prophylactic heparin which can be reversed if needed prior to intervention) 4.  Hypertension: Controlled; continue Imdur and metoprolol 5.  UTI: Present on admission.  Continue ceftriaxone 6.  DVT prophylaxis: As above 7.  GI prophylaxis: None The patient is a DNR.  Time spent on admission orders and patient care approximately 45 minutes  Arnaldo Nataliamond,  Michael S, MD 08/05/2019, 7:15 AM

## 2019-08-05 NOTE — Progress Notes (Signed)
[facility] [place] NAME: Asheley Hellberg    MR#:  [id number]  DATE OF BIRTH:  02/10/1933  SUBJECTIVE:  CHIEF COMPLAINT:   Chief Complaint  Patient presents with  . Nausea  . Abdominal Pain  Patient seen and evaluated today Has abdominal pain Has nausea No fever  REVIEW OF SYSTEMS:    ROS  CONSTITUTIONAL: No documented fever. Has fatigue, weakness. No weight gain, no weight loss.  EYES: No blurry or double vision.  ENT: No tinnitus. No postnasal drip. No redness of the oropharynx.  RESPIRATORY: No cough, no wheeze, no hemoptysis. No dyspnea.  CARDIOVASCULAR: No chest pain. No orthopnea. No palpitations. No syncope.  GASTROINTESTINAL: Has nausea, no vomiting or diarrhea. Has abdominal pain. No melena or hematochezia.  GENITOURINARY: No dysuria or hematuria.  ENDOCRINE: No polyuria or nocturia. No heat or cold intolerance.  HEMATOLOGY: No anemia. No bruising. No bleeding.  INTEGUMENTARY: No rashes. No lesions.  MUSCULOSKELETAL: No arthritis. No swelling. No gout.  NEUROLOGIC: No numbness, tingling, or ataxia. No seizure-type activity.  PSYCHIATRIC: No anxiety. No insomnia. No ADD.   DRUG ALLERGIES:   Allergies  Allergen Reactions  . Tramadol Itching and Nausea And Vomiting    VITALS:  Blood pressure (!) 100/55, pulse 84, temperature 98.3 F (36.8 C), temperature source Oral, resp. rate 20, height 5\' 1"  (1.549 m), weight 52.5 kg, SpO2 95 %.  PHYSICAL EXAMINATION:   Physical Exam  GENERAL:  85 y.o.-year-old patient lying in the bed with no acute distress.  EYES: Pupils equal, round, reactive to light and accommodation. No scleral icterus. Extraocular muscles intact.  HEENT: Head atraumatic, normocephalic. Oropharynx and nasopharynx clear.  NECK:  Supple, no jugular venous distention. No thyroid enlargement, no tenderness.  LUNGS: Normal breath sounds bilaterally, no wheezing, rales, rhonchi. No use of accessory muscles of  respiration.  CARDIOVASCULAR: S1, S2 normal. No murmurs, rubs, or gallops.  ABDOMEN: Soft, tenderness right upper quadrant, nondistended. Bowel sounds decreased. No organomegaly or mass.  EXTREMITIES: No cyanosis, clubbing or edema b/l.    NEUROLOGIC: Cranial nerves II through XII are intact. No focal Motor or sensory deficits b/l.   PSYCHIATRIC: The patient is alert and oriented x 3.  SKIN: No obvious rash, lesion, or ulcer.   LABORATORY PANEL:   CBC Recent Labs  Lab 08/04/19 2050  WBC 9.9  HGB 12.8  HCT 39.7  PLT 256   ------------------------------------------------------------------------------------------------------------------ Chemistries  Recent Labs  Lab 08/04/19 2132  NA 142  K 3.5  CL 106  CO2 22  GLUCOSE 118*  BUN 18  CREATININE 0.53  CALCIUM 8.5*  AST 433*  424*  ALT 294*  292*  ALKPHOS 317*  312*  BILITOT 2.8*  2.6*   ------------------------------------------------------------------------------------------------------------------  Cardiac Enzymes No results for input(s): TROPONINI in the last 168 hours. ------------------------------------------------------------------------------------------------------------------  RADIOLOGY:  Dg Chest 2 View  Result Date: 08/04/2019 CLINICAL DATA:  Fever nausea vomiting EXAM: CHEST - 2 VIEW COMPARISON:  12/14/2018 FINDINGS: Linear scarring or atelectasis at the left base. No focal consolidation or pleural effusion. Stable cardiomediastinal silhouette with aortic atherosclerosis. No pneumothorax. IMPRESSION: No active cardiopulmonary disease. Mild scarring or atelectasis at the left lung base. Electronically Signed   By: Donavan Foil M.D.   On: 08/04/2019 21:22   US Abdomen Limited Ruq  Result Date: 08/04/2019 CLINICAL DATA:  Upper abdominal pain EXAM: ULTRASOUND ABDOMEN LIMITED RIGHT UPPER QUADRANT COMPARISON:  None. FINDINGS: Gallbladder: Fluid-filled and distended with layering sludge and small  gallbladder  stones. The gallbladder wall is mildly prominent measuring 3.4-4 mm. The patient does have a sonographic Murphy sign. Common bile duct: Diameter: 7 mm Liver: No focal lesion identified. Within normal limits in parenchymal echogenicity. Portal vein is patent on color Doppler imaging with normal direction of blood flow towards the liver. Other: None. IMPRESSION: Findings which could be suggestive of acute cholecystitis as described above. Electronically Signed   By: Jonna Clark M.D.   On: 08/04/2019 23:28     ASSESSMENT AND PLAN:  85 year old female patient with history of coronary disease, type 2 diabetes mellitus, hypertension, bronchial asthma, polymyalgia rheumatica currently under hospitalist service.  -Acute cholecystitis Appreciate surgery evaluation Follow-up GI consult Continue IV Rocephin and Flagyl antibiotic Follow-up WBC count Check MRCP abdomen for assessment of choledocholithiasis IV fluids N.p.o. for now  -Hypotension Blood pressure borderline Hold blood pressure meds  -Abnormal LFT secondary to cholecystitis Monitor LFTs and GI follow-up  -Coronary disease Hold aspirin and Plavix for now Patient may need surgery if no resolution  -DVT prophylaxis With subcu heparin  All the records are reviewed and case discussed with Care Management/Social Worker. Management plans discussed with the patient, family and they are in agreement.  CODE STATUS: DNR  DVT Prophylaxis: SCDs  TOTAL TIME TAKING CARE OF THIS PATIENT: 45 minutes.   POSSIBLE D/C IN 2 to 3 DAYS, DEPENDING ON CLINICAL CONDITION.  Ihor Austin M.D on 08/05/2019 at 10:43 AM  Between 7am to 6pm - Pager - [phone]  After 6pm go to [email or web address] - password [email or web address] [facility]  [facility]  Office  [phone]  CC: Primary care physician; Dale [place], MD  Note: This dictation was prepared with Dragon dictation along with smaller phrase technology. Any transcriptional errors that result  from this process are unintentional.

## 2019-08-05 NOTE — Consult Note (Signed)
Date of Consultation:  08/05/2019  Requesting Physician:  Saundra Shelling, MD  Reason for Consultation:  Cholecystitis  History of Present Illness: Tanya Cole is a 85 y.o. female admitted overnight with possible cholecystitis and possible choledocholithiasis.  Patient reports that she started having nausea on 10/3 and then progressed with pain on 10/4.  The pain is located in the epigastric and RUQ areas.  She did have an episode of emesis at home.  Denies any fevers, chills, chest pain, shortness of breath.  Reports having very sporadic episodes of biliary colic throughout the years and thinks the last one was bout 3-4 years ago.  She presented to the ED overnight and her workup included labs and u/s.  Her labs showed a WBC of 9.9 but with a left shift, and elevated LFTs, with total bili of 2.6, direct component 1.6, AST 424, ALT 292, and Alk phos 312.  Lipase was 160.  U/S showed mild gallbladder wall thickening with layering sludge and stones.  Of note, the patient had an NSTEMI 09/2018 and had a DES placed.  She's on Aspirin and Plavix.  Past Medical History: Past Medical History:  Diagnosis Date  . Aortic insufficiency    a. TTE 12/19: EF 55-60%, probable HK of the mid apical anterior septal myocardium, Gr1DD, mild AI, mildly dilated LA  . Asthma   . CAD (coronary artery disease)    a. NSTEMI 12/19; b. LHC 10/08/18: LM minimal luminal irregs, mLAD-1 95% s/p PCI/DES, mLAD-2 60%, LCx mild diffuse disease throughout, RCA minimal luminal irregs  . Diabetes mellitus ([place])   . Hypercholesterolemia   . Hypertension   . Osteopenia   . Polymyalgia rheumatica syndrome ([place])   . Reactive airway disease      Past Surgical History: Past Surgical History:  Procedure Laterality Date  . ABDOMINAL HYSTERECTOMY  1981   prolapse and bleeding, ovaries not removed  . BREAST EXCISIONAL BIOPSY Right   . CORONARY STENT INTERVENTION N/A 10/08/2018   Procedure: CORONARY STENT INTERVENTION;   Surgeon: Wellington Hampshire, MD;  Location: [facility];  Service: Cardiovascular;  Laterality: N/A;  . LEFT HEART CATH AND CORONARY ANGIOGRAPHY N/A 10/08/2018   Procedure: LEFT HEART CATH AND CORONARY ANGIOGRAPHY;  Surgeon: Wellington Hampshire, MD;  Location: [facility];  Service: Cardiovascular;  Laterality: N/A;  . UMBILICAL HERNIA REPAIR  7/94    Home Medications: Prior to Admission medications   Medication Sig Start Date End Date Taking? Authorizing Provider  ADVAIR DISKUS 250-50 MCG/DOSE AEPB INHALE ONE PUFF BY MOUTH EVERY 12 HOURS. RINSE MOUTH AFTER EACH USE Patient taking differently: Inhale 1 puff into the lungs 2 (two) times daily.  02/17/16  Yes Einar Pheasant, MD  atorvastatin (LIPITOR) 40 MG tablet Take 1 tablet (40 mg total) by mouth daily at 6 PM. 10/18/18  Yes Dunn, Areta Haber, PA-C  clopidogrel (PLAVIX) 75 MG tablet Take 1 tablet (75 mg total) by mouth daily with breakfast. 10/18/18  Yes Dunn, Areta Haber, PA-C  metoprolol tartrate (LOPRESSOR) 25 MG tablet Take 1 tablet (25 mg total) by mouth 2 (two) times daily. Patient taking differently: Take 12.5 mg by mouth 2 (two) times daily.  11/27/18  Yes Dunn, Ryan M, PA-C  PROAIR HFA 108 [phone] Base) MCG/ACT inhaler Inhale 2 puffs into the lungs every 6 (six) hours as needed. Patient taking differently: Inhale 2 puffs into the lungs every 6 (six) hours as needed for wheezing or shortness of breath.  05/28/19  Yes  Einar Pheasant, MD  alendronate (FOSAMAX) 70 MG tablet Take 70 mg by mouth once a week.  11/06/17   [provider]  aspirin 81 MG tablet Take 81 mg by mouth daily.    [provider]  cyanocobalamin (,VITAMIN B-12,) 1000 MCG/ML injection Inject 1,000 mcg into the muscle every 30 (thirty) days.     [provider]  fluticasone (FLONASE) 50 MCG/ACT nasal spray USE 2 SPRAY(S) IN EACH NOSTRIL ONCE DAILY Patient taking differently: Place 1 spray into both nostrils daily.  08/27/18   Einar Pheasant, MD   isosorbide mononitrate (IMDUR) 30 MG 24 hr tablet Take 1 tablet (30 mg total) by mouth daily. 12/16/18   Hillary Bow, MD  lisinopril (ZESTRIL) 20 MG tablet Take 1 tablet (20 mg total) by mouth daily. 04/15/19   Dunn, Areta Haber, PA-C  metFORMIN (GLUCOPHAGE) 500 MG tablet TAKE 1 TABLET BY MOUTH TWICE DAILY WITH A MEAL Patient taking differently: Take 500 mg by mouth 2 (two) times daily with a meal.  05/15/19   Einar Pheasant, MD  Multiple Vitamins-Minerals (VISION FORMULA/LUTEIN PO) Take by mouth daily.    [provider]  nitroGLYCERIN (NITROSTAT) 0.4 MG SL tablet Place 1 tablet (0.4 mg total) under the tongue every 5 (five) minutes as needed for chest pain. 10/09/18   Bettey Costa, MD  Vitamin D, Ergocalciferol, (DRISDOL) 50000 units CAPS capsule Take 50,000 Units by mouth once a week. 06/24/18   [provider]    Allergies: Allergies  Allergen Reactions  . Tramadol Itching and Nausea And Vomiting    Social History:  reports that she has never smoked. She has never used smokeless tobacco. She reports that she does not drink alcohol or use drugs.   Family History: Family History  Problem Relation Age of Onset  . Heart attack Father   . Arthritis Mother   . Heart disease Mother   . Throat cancer Sister   . Parkinson's disease Sister   . COPD Brother     Review of Systems: Review of Systems  Constitutional: Negative for chills and fever.  HENT: Negative for hearing loss.   Eyes: Negative for blurred vision.  Respiratory: Negative for shortness of breath.   Cardiovascular: Negative for chest pain.  Gastrointestinal: Positive for abdominal pain, nausea and vomiting. Negative for constipation and diarrhea.  Genitourinary: Negative for dysuria.  Musculoskeletal: Negative for myalgias.  Skin: Negative for rash.  Neurological: Negative for dizziness.  Psychiatric/Behavioral: Negative for depression.    Physical Exam BP (!) 100/55 (BP Location: Left Arm)   Pulse  84   Temp 98.3 F (36.8 C) (Oral)   Resp 20   Ht '5\' 1"'  (1.549 m)   Wt 52.5 kg   SpO2 95%   BMI 21.88 kg/m  CONSTITUTIONAL: No acute distress HEENT:  Normocephalic, atraumatic, extraocular motion intact. NECK: Trachea is midline, and there is no jugular venous distension. RESPIRATORY:  Lungs are clear, and breath sounds are equal bilaterally. Normal respiratory effort without pathologic use of accessory muscles. CARDIOVASCULAR: Heart is regular without murmurs, gallops, or rubs. GI: The abdomen is soft, non-distended, with tenderness to palpation in epigastric and RUQ areas.  Negative Murphy's sign. MUSCULOSKELETAL:  Normal muscle strength and tone in all four extremities.  No peripheral edema or cyanosis. SKIN: Skin turgor is normal. There are no pathologic skin lesions.  NEUROLOGIC:  Motor and sensation is grossly normal.  Cranial nerves are grossly intact. PSYCH:  Alert and oriented to person, place and time. Affect  is normal.  Laboratory Analysis: Results for orders placed or performed during the hospital encounter of 08/04/19 (from the past 24 hour(s))  CBC with Differential     Status: Abnormal   Collection Time: 08/04/19  8:50 PM  Result Value Ref Range   WBC 9.9 4.0 - 10.5 K/uL   RBC 4.56 3.87 - 5.11 MIL/uL   Hemoglobin 12.8 12.0 - 15.0 g/dL   HCT 39.7 36.0 - 46.0 %   MCV 87.1 80.0 - 100.0 fL   MCH 28.1 26.0 - 34.0 pg   MCHC 32.2 30.0 - 36.0 g/dL   RDW 14.6 11.5 - 15.5 %   Platelets 256 150 - 400 K/uL   nRBC 0.0 0.0 - 0.2 %   Neutrophils Relative % 92 %   Neutro Abs 9.0 (H) 1.7 - 7.7 K/uL   Lymphocytes Relative 3 %   Lymphs Abs 0.3 (L) 0.7 - 4.0 K/uL   Monocytes Relative 1 %   Monocytes Absolute 0.1 0.1 - 1.0 K/uL   Eosinophils Relative 4 %   Eosinophils Absolute 0.4 0.0 - 0.5 K/uL   Basophils Relative 0 %   Basophils Absolute 0.0 0.0 - 0.1 K/uL   Immature Granulocytes 0 %   Abs Immature Granulocytes 0.03 0.00 - 0.07 K/uL  Lipase, blood     Status: Abnormal    Collection Time: 08/04/19  8:50 PM  Result Value Ref Range   Lipase 160 (H) 11 - 51 U/L  Troponin I (High Sensitivity)     Status: None   Collection Time: 08/04/19  8:50 PM  Result Value Ref Range   Troponin I (High Sensitivity) 8 <18 ng/L  Hepatic function panel     Status: Abnormal   Collection Time: 08/04/19  9:32 PM  Result Value Ref Range   Total Protein 6.7 6.5 - 8.1 g/dL   Albumin 3.9 3.5 - 5.0 g/dL   AST 424 (H) 15 - 41 U/L   ALT 292 (H) 0 - 44 U/L   Alkaline Phosphatase 312 (H) 38 - 126 U/L   Total Bilirubin 2.6 (H) 0.3 - 1.2 mg/dL   Bilirubin, Direct 1.6 (H) 0.0 - 0.2 mg/dL   Indirect Bilirubin 1.0 (H) 0.3 - 0.9 mg/dL  Basic metabolic panel     Status: Abnormal   Collection Time: 08/04/19  9:32 PM  Result Value Ref Range   Sodium 142 135 - 145 mmol/L   Potassium 3.5 3.5 - 5.1 mmol/L   Chloride 106 98 - 111 mmol/L   CO2 22 22 - 32 mmol/L   Glucose, Bld 118 (H) 70 - 99 mg/dL   BUN 18 8 - 23 mg/dL   Creatinine, Ser 0.53 0.44 - 1.00 mg/dL   Calcium 8.5 (L) 8.9 - 10.3 mg/dL   GFR calc non Af Amer >60 >60 mL/min   GFR calc Af Amer >60 >60 mL/min   Anion gap 14 5 - 15  TSH     Status: None   Collection Time: 08/04/19  9:32 PM  Result Value Ref Range   TSH 2.326 0.350 - 4.500 uIU/mL  Hepatic function panel     Status: Abnormal   Collection Time: 08/04/19  9:32 PM  Result Value Ref Range   Total Protein 7.0 6.5 - 8.1 g/dL   Albumin 4.1 3.5 - 5.0 g/dL   AST 433 (H) 15 - 41 U/L   ALT 294 (H) 0 - 44 U/L   Alkaline Phosphatase 317 (H) 38 - 126 U/L   Total  Bilirubin 2.8 (H) 0.3 - 1.2 mg/dL   Bilirubin, Direct 1.5 (H) 0.0 - 0.2 mg/dL   Indirect Bilirubin 1.3 (H) 0.3 - 0.9 mg/dL  Lactic acid, plasma     Status: Abnormal   Collection Time: 08/04/19 10:32 PM  Result Value Ref Range   Lactic Acid, Venous 2.1 (HH) 0.5 - 1.9 mmol/L  Blood culture (routine x 2)     Status: None (Preliminary result)   Collection Time: 08/04/19 10:32 PM   Specimen: BLOOD  Result Value Ref  Range   Specimen Description BLOOD LEFT FOREARM    Special Requests      BOTTLES DRAWN AEROBIC AND ANAEROBIC Blood Culture results may not be optimal due to an excessive volume of blood received in culture bottles   Culture      NO GROWTH < 12 HOURS Performed at [facility], [address] [place], [place] [address]    Report Status PENDING   Blood culture (routine x 2)     Status: None (Preliminary result)   Collection Time: 08/04/19 10:32 PM   Specimen: BLOOD  Result Value Ref Range   Specimen Description BLOOD RIGHT FOREARM    Special Requests      BOTTLES DRAWN AEROBIC AND ANAEROBIC Blood Culture adequate volume   Culture      NO GROWTH < 12 HOURS Performed at [facility], [address] [place], [place] [address]    Report Status PENDING   Urinalysis, Complete w Microscopic     Status: Abnormal   Collection Time: 08/04/19 10:32 PM  Result Value Ref Range   Color, Urine AMBER (A) YELLOW   APPearance CLEAR (A) CLEAR   Specific Gravity, Urine 1.025 1.005 - 1.030   pH 6.0 5.0 - 8.0   Glucose, UA NEGATIVE NEGATIVE mg/dL   Hgb urine dipstick NEGATIVE NEGATIVE   Bilirubin Urine SMALL (A) NEGATIVE   Ketones, ur 5 (A) NEGATIVE mg/dL   Protein, ur 30 (A) NEGATIVE mg/dL   Nitrite NEGATIVE NEGATIVE   Leukocytes,Ua SMALL (A) NEGATIVE   RBC / HPF 0-5 0 - 5 RBC/hpf   WBC, UA 11-20 0 - 5 WBC/hpf   Bacteria, UA MANY (A) NONE SEEN   Squamous Epithelial / LPF 0-5 0 - 5   Mucus PRESENT    Hyaline Casts, UA PRESENT   SARS Coronavirus 2 [facility] order, Performed in [place] hospital lab) Nasopharyngeal     Status: None   Collection Time: 08/04/19 10:32 PM   Specimen: Nasopharyngeal  Result Value Ref Range   SARS Coronavirus 2 NEGATIVE NEGATIVE  Lactic acid, plasma     Status: Abnormal   Collection Time: 08/05/19  1:13 AM  Result Value Ref Range   Lactic Acid, Venous 2.2 (HH) 0.5 - 1.9 mmol/L    Imaging: Dg Chest 2 View  Result Date:  08/04/2019 CLINICAL DATA:  Fever nausea vomiting EXAM: CHEST - 2 VIEW COMPARISON:  12/14/2018 FINDINGS: Linear scarring or atelectasis at the left base. No focal consolidation or pleural effusion. Stable cardiomediastinal silhouette with aortic atherosclerosis. No pneumothorax. IMPRESSION: No active cardiopulmonary disease. Mild scarring or atelectasis at the left lung base. Electronically Signed   By: Donavan Foil M.D.   On: 08/04/2019 21:22   US Abdomen Limited Ruq  Result Date: 08/04/2019 CLINICAL DATA:  Upper abdominal pain EXAM: ULTRASOUND ABDOMEN LIMITED RIGHT UPPER QUADRANT COMPARISON:  None. FINDINGS: Gallbladder: Fluid-filled and distended with layering sludge and small gallbladder stones. The gallbladder wall is mildly prominent measuring 3.4-4  mm. The patient does have a sonographic Murphy sign. Common bile duct: Diameter: 7 mm Liver: No focal lesion identified. Within normal limits in parenchymal echogenicity. Portal vein is patent on color Doppler imaging with normal direction of blood flow towards the liver. Other: None. IMPRESSION: Findings which could be suggestive of acute cholecystitis as described above. Electronically Signed   By: Prudencio Pair M.D.   On: 08/04/2019 23:28    Assessment and Plan: This is a 85 y.o. female with possible cholecystitis and choledocholithiasis  --Will check LFTs again this morning.  Agree with GI consultation and would imagine that she would need MRCP to rule out choledocholithiasis.   --Given her ASA and Plavix, would not recommend undergoing cholecystectomy at this point, particularly as she's less than a year from her MI.  If clinically there is no improvement, may need percutaneous cholecystostomy tube placement instead. --Started her on IV abx.  She received one-time doses of CTX and Flagyl in ED.   --Keep NPO, on IV fluids.  Face-to-face time spent with the patient and care providers was 80 minutes, with more than 50% of the time spent counseling,  educating, and coordinating care of the patient.     Melvyn Neth, MD [facility] Pg:  [phone]

## 2019-08-05 NOTE — ED Notes (Signed)
Lactic acid drawn and sent to lab

## 2019-08-05 NOTE — Consult Note (Signed)
Jonathon Bellows , MD [address], [place], [place], [name], [address] [address], [place], [place], [name], [address] Phone: [phone]  Fax: [phone]  Consultation  Referring Provider:    Dr. Marcille Blanco  primary Care Physician:  Einar Pheasant, MD Primary Gastroenterologist: None       Reason for Consultation:     Choledocholithiasis  Date of Admission:  08/04/2019 Date of Consultation:  08/05/2019         HPI:   Tanya Cole is a 85 y.o. female admitted on 08/04/2019 with abdominal pain.  08/04/2019: Right upper quadrant ultrasound: Common bile duct diameter 7 mm.  Fluid-filled and distended gallbladder and layering sludge and small stones.  Gallbladder wall is mildly prominent and has a positive sonographic Murphy sign.  08/04/2019: Hemoglobin 12.8 with a white cell count of 9.9.  Lipase elevated to 160. Acute rise in transaminases.  AST 424, ALT 292, alkaline phosphatase 312, total bilirubin 2.6 after which 1.6 with direct bilirubin and 1.0 is indirect bilirubin.  Creatinine 0.53.  TSH 2.326  She says that the abdominal pain began in the epigastric region yesterday , cant recall how long it went on , associated with nausea, has had similar episodes in the past. Denies any fever, today has no abdominal pain , no fever but has nausea.     Past Medical History:  Diagnosis Date   Aortic insufficiency    a. TTE 12/19: EF 55-60%, probable HK of the mid apical anterior septal myocardium, Gr1DD, mild AI, mildly dilated LA   Asthma    CAD (coronary artery disease)    a. NSTEMI 12/19; b. LHC 10/08/18: LM minimal luminal irregs, mLAD-1 95% s/p PCI/DES, mLAD-2 60%, LCx mild diffuse disease throughout, RCA minimal luminal irregs   Diabetes mellitus ([facility])    Hypercholesterolemia    Hypertension    Osteopenia    Polymyalgia rheumatica syndrome ([facility])    Reactive airway disease     Past Surgical History:  Procedure Laterality Date   ABDOMINAL HYSTERECTOMY  1981    prolapse and bleeding, ovaries not removed   BREAST EXCISIONAL BIOPSY Right    CORONARY STENT INTERVENTION N/A 10/08/2018   Procedure: CORONARY STENT INTERVENTION;  Surgeon: Wellington Hampshire, MD;  Location: [facility];  Service: Cardiovascular;  Laterality: N/A;   LEFT HEART CATH AND CORONARY ANGIOGRAPHY N/A 10/08/2018   Procedure: LEFT HEART CATH AND CORONARY ANGIOGRAPHY;  Surgeon: Wellington Hampshire, MD;  Location: [facility];  Service: Cardiovascular;  Laterality: N/A;   UMBILICAL HERNIA REPAIR  7/94    Prior to Admission medications   Medication Sig Start Date End Date Taking? Authorizing Provider  ADVAIR DISKUS 250-50 MCG/DOSE AEPB INHALE ONE PUFF BY MOUTH EVERY 12 HOURS. RINSE MOUTH AFTER EACH USE Patient taking differently: Inhale 1 puff into the lungs 2 (two) times daily.  02/17/16  Yes Einar Pheasant, MD  atorvastatin (LIPITOR) 40 MG tablet Take 1 tablet (40 mg total) by mouth daily at 6 PM. 10/18/18  Yes Dunn, Areta Haber, PA-C  clopidogrel (PLAVIX) 75 MG tablet Take 1 tablet (75 mg total) by mouth daily with breakfast. 10/18/18  Yes Dunn, Areta Haber, PA-C  metoprolol tartrate (LOPRESSOR) 25 MG tablet Take 1 tablet (25 mg total) by mouth 2 (two) times daily. Patient taking differently: Take 12.5 mg by mouth 2 (two) times daily.  11/27/18  Yes Dunn, Ryan M, PA-C  PROAIR HFA 108 [phone] Base) MCG/ACT inhaler Inhale 2 puffs into the lungs  every 6 (six) hours as needed. Patient taking differently: Inhale 2 puffs into the lungs every 6 (six) hours as needed for wheezing or shortness of breath.  05/28/19  Yes Dale Alta, MD  alendronate (FOSAMAX) 70 MG tablet Take 70 mg by mouth once a week.  11/06/17   [provider]  aspirin 81 MG tablet Take 81 mg by mouth daily.    [provider]  cyanocobalamin (,VITAMIN B-12,) 1000 MCG/ML injection Inject 1,000 mcg into the muscle every 30 (thirty) days.     [provider]  fluticasone (FLONASE) 50 MCG/ACT nasal  spray USE 2 SPRAY(S) IN EACH NOSTRIL ONCE DAILY Patient taking differently: Place 1 spray into both nostrils daily.  08/27/18   Dale [place], MD  isosorbide mononitrate (IMDUR) 30 MG 24 hr tablet Take 1 tablet (30 mg total) by mouth daily. 12/16/18   Milagros Loll, MD  lisinopril (ZESTRIL) 20 MG tablet Take 1 tablet (20 mg total) by mouth daily. 04/15/19   Dunn, Raymon Mutton, PA-C  metFORMIN (GLUCOPHAGE) 500 MG tablet TAKE 1 TABLET BY MOUTH TWICE DAILY WITH A MEAL Patient taking differently: Take 500 mg by mouth 2 (two) times daily with a meal.  05/15/19   Dale [place], MD  Multiple Vitamins-Minerals (VISION FORMULA/LUTEIN PO) Take by mouth daily.    [provider]  nitroGLYCERIN (NITROSTAT) 0.4 MG SL tablet Place 1 tablet (0.4 mg total) under the tongue every 5 (five) minutes as needed for chest pain. 10/09/18   Adrian Saran, MD  Vitamin D, Ergocalciferol, (DRISDOL) 50000 units CAPS capsule Take 50,000 Units by mouth once a week. 06/24/18   [provider]    Family History  Problem Relation Age of Onset   Heart attack Father    Arthritis Mother    Heart disease Mother    Throat cancer Sister    Parkinson's disease Sister    COPD Brother      Social History   Tobacco Use   Smoking status: Never Smoker   Smokeless tobacco: Never Used  Substance Use Topics   Alcohol use: No    Alcohol/week: 0.0 standard drinks   Drug use: No    Allergies as of 08/04/2019 - Review Complete 08/04/2019  Allergen Reaction Noted   Tramadol Itching 04/29/2016    Review of Systems:    All systems reviewed and negative except where noted in HPI.   Physical Exam:  Vital signs in last 24 hours: Temp:  [98.3 F (36.8 C)-99.8 F (37.7 C)] 98.3 F (36.8 C) (10/05 0507) Pulse Rate:  [84-105] 84 (10/05 0507) Resp:  [18-31] 20 (10/05 0507) BP: (100-159)/(49-80) 100/55 (10/05 0507) SpO2:  [90 %-97 %] 95 % (10/05 0507) Weight:  [52.5 kg-54.4 kg] 52.5 kg (10/05 0228) Last BM  Date: 08/04/19 General:   Pleasant, cooperative in NAD Head:  Normocephalic and atraumatic. Eyes:   No icterus.   Conjunctiva pink. PERRLA. Ears:  Normal auditory acuity. Neck:  Supple; no masses or thyroidomegaly Lungs: Respirations even and unlabored. Lungs clear to auscultation bilaterally.   No wheezes, crackles, or rhonchi.  Heart:  Regular rate and rhythm;  Without murmur, clicks, rubs or gallops Abdomen:  Soft, nondistended, mid epigastric tenderness Normal bowel sounds. No appreciable masses or hepatomegaly.  No rebound or guarding.  Neurologic:  Alert and oriented x3;  grossly normal neurologically. Skin:  Intact without significant lesions or rashes. Cervical Nodes:  No significant cervical adenopathy. Psych:  Alert and cooperative. Normal affect.  LAB RESULTS: Recent Labs  08/04/19 2050  WBC 9.9  HGB 12.8  HCT 39.7  PLT 256   BMET Recent Labs    08/04/19 2132  NA 142  K 3.5  CL 106  CO2 22  GLUCOSE 118*  BUN 18  CREATININE 0.53  CALCIUM 8.5*   LFT Recent Labs    08/04/19 2132  PROT 6.7  ALBUMIN 3.9  AST 424*  ALT 292*  ALKPHOS 312*  BILITOT 2.6*  BILIDIR 1.6*  IBILI 1.0*   PT/INR No results for input(s): LABPROT, INR in the last 72 hours.  STUDIES: Dg Chest 2 View  Result Date: 08/04/2019 CLINICAL DATA:  Fever nausea vomiting EXAM: CHEST - 2 VIEW COMPARISON:  12/14/2018 FINDINGS: Linear scarring or atelectasis at the left base. No focal consolidation or pleural effusion. Stable cardiomediastinal silhouette with aortic atherosclerosis. No pneumothorax. IMPRESSION: No active cardiopulmonary disease. Mild scarring or atelectasis at the left lung base. Electronically Signed   By: Jasmine Pang M.D.   On: 08/04/2019 21:22   US Abdomen Limited Ruq  Result Date: 08/04/2019 CLINICAL DATA:  Upper abdominal pain EXAM: ULTRASOUND ABDOMEN LIMITED RIGHT UPPER QUADRANT COMPARISON:  None. FINDINGS: Gallbladder: Fluid-filled and distended with layering sludge  and small gallbladder stones. The gallbladder wall is mildly prominent measuring 3.4-4 mm. The patient does have a sonographic Murphy sign. Common bile duct: Diameter: 7 mm Liver: No focal lesion identified. Within normal limits in parenchymal echogenicity. Portal vein is patent on color Doppler imaging with normal direction of blood flow towards the liver. Other: None. IMPRESSION: Findings which could be suggestive of acute cholecystitis as described above. Electronically Signed   By: Jonna Clark M.D.   On: 08/04/2019 23:28      Impression / Plan:   Tanya Cole is a 85 y.o. y/o female admitted with acute abdominal pain, elevated liver function tests including a total bilirubin of 2.6, direct component 1.6.  Elevated transaminases and alkaline phosphatase.  Right upper quadrant ultrasound shows a common bile duct diameter of 7 mm.  There are features of acute cholecystitis and sonographic evidence of positive Murphy's sign.  I have been consulted for choledocholithiasis.  Impression: The total bilirubin is less than 4 and the common bile duct diameter is very minimally dilated at 7 mm with normal being 6 but which can increase in size with age.  She has a moderate pretest probability of choledocholithiasis which needs to be confirmed by an MRCP prior to considering any ERCP.  She does however have acute cholecystitis  Plan 1.  Rehydrate with IV fluids 2.  MRCP 3.  Surgery consult for cholecystectomy  Addendum :  1. MRCP shows cholidocholithiasis - Will need ERCP - patient on Asprin and plavix. Discussed with Dr Tobi Bastos to hold if ok from his side and will plan for ERCP on Thursday morning with Dr Servando Snare .   Thank you for involving me in the care of this patient.      LOS: 0 days   Wyline Mood, MD  08/05/2019, 8:21 AM

## 2019-08-05 NOTE — Progress Notes (Signed)
Advanced care plan. Purpose of the Encounter: CODE STATUS Parties in Attendance: Patient Patient's Decision Capacity: Good Subjective/Patient's story: The patient with past medical history of CAD, diabetes, hypertension, asthma and polymyalgia rheumatica presents to the emergency department complaining of abdominal pain.  The patient reports that her pain began 3 days ago but seemed to ease off.  She had one episode of nonbloody nonbilious emesis at the onset of pain but has not had vomiting since that time.  However, she does admit to some nausea.  The pain radiates across her upper abdomen and epigastric region.  Ultrasound of her right upper quadrant revealed thickened gallbladder consistent with cholecystitis.  Common bile duct was also dilated.  Laboratory evaluation significant for elevated liver enzymes.  The patient received external metronidazole prior to the emergency department staff calling the hospitalist service for admission. Objective/Medical story Patient needs IV antibiotics surgery and gastroenterology evaluation Needs MRCP abdomen for further evaluation for choledocholithiasis Goals of care determination:  Advance care directives goals of care treatment plan discussed Patient does not want CPR, intubation ventilator if the need arises CODE STATUS: DNR Time spent discussing advanced care planning: 16 minutes

## 2019-08-06 LAB — COMPREHENSIVE METABOLIC PANEL
ALT: 233 U/L — ABNORMAL HIGH (ref 0–44)
AST: 219 U/L — ABNORMAL HIGH (ref 15–41)
Albumin: 2.8 g/dL — ABNORMAL LOW (ref 3.5–5.0)
Alkaline Phosphatase: 244 U/L — ABNORMAL HIGH (ref 38–126)
Anion gap: 5 (ref 5–15)
BUN: 14 mg/dL (ref 8–23)
CO2: 23 mmol/L (ref 22–32)
Calcium: 7.1 mg/dL — ABNORMAL LOW (ref 8.9–10.3)
Chloride: 111 mmol/L (ref 98–111)
Creatinine, Ser: 0.68 mg/dL (ref 0.44–1.00)
GFR calc Af Amer: >60 mL/min (ref >60)
GFR calc non Af Amer: >60 mL/min (ref >60)
Glucose, Bld: 94 mg/dL (ref 70–99)
Potassium: 3.1 mmol/L — ABNORMAL LOW (ref 3.5–5.1)
Sodium: 139 mmol/L (ref 135–145)
Total Bilirubin: 2.3 mg/dL — ABNORMAL HIGH (ref 0.3–1.2)
Total Protein: 5.4 g/dL — ABNORMAL LOW (ref 6.5–8.1)

## 2019-08-06 LAB — CBC
HCT: 31.1 % — ABNORMAL LOW (ref 36.0–46.0)
Hemoglobin: 10 g/dL — ABNORMAL LOW (ref 12.0–15.0)
MCH: 28.1 pg (ref 26.0–34.0)
MCHC: 32.2 g/dL (ref 30.0–36.0)
MCV: 87.4 fL (ref 80.0–100.0)
Platelets: 197 10*3/uL (ref 150–400)
RBC: 3.56 MIL/uL — ABNORMAL LOW (ref 3.87–5.11)
RDW: 14.8 % (ref 11.5–15.5)
WBC: 9.8 10*3/uL (ref 4.0–10.5)
nRBC: 0 % (ref 0.0–0.2)

## 2019-08-06 LAB — URINE CULTURE: Culture: >=100000 — AB

## 2019-08-06 MED ORDER — POTASSIUM CHLORIDE IN NACL 20-0.9 MEQ/L-% IV SOLN
INTRAVENOUS | Status: DC
  Administered 2019-08-06 – 2019-08-09 (×7): via INTRAVENOUS
  Filled 2019-08-06 (×10): qty 1000

## 2019-08-06 NOTE — Progress Notes (Signed)
Tanya MoodKiran Owain Eckerman , MD [address], [address], [place], [place], [address] [address], [place], [place], [address] Phone: [phone]  Fax: [phone]   Rollen SoxVirginia Mae Merceda ElksBaugh is being followed for choledocholithiasis day 1 of follow up   Subjective: No abdominal pain, no fever   Objective: Vital signs in last 24 hours: Vitals:   08/05/19 2017 08/06/19 0500 08/06/19 0509 08/06/19 0900  BP: 97/62  (!) 110/56 (!) 126/51  Pulse: 69  61 63  Resp: 17  18 20   Temp: 98.7 F (37.1 C)  98.5 F (36.9 C) 98.3 F (36.8 C)  TempSrc: Oral  Oral Oral  SpO2: 95%  96% 97%  Weight:  53.4 kg    Height:       Weight change: -1.032 kg  Intake/Output Summary (Last 24 hours) at 08/06/2019 [id number] Last data filed at 08/06/2019 [id number] Gross per 24 hour  Intake 2567.02 ml  Output 300 ml  Net 2267.02 ml     Exam: Heart:: Regular rate and rhythm, S1S2 present or without murmur or extra heart sounds Lungs: normal, clear to auscultation and clear to auscultation and percussion Abdomen: soft, nontender, normal bowel sounds   Lab Results: @LABTEST2 @ Micro Results: Recent Results (from the past 240 hour(s))  Blood culture (routine x 2)     Status: None (Preliminary result)   Collection Time: 08/04/19 10:32 PM   Specimen: BLOOD  Result Value Ref Range Status   Specimen Description   Final    BLOOD LEFT FOREARM Performed at [facility], [address] [place], [place] [address]    Special Requests   Final    BOTTLES DRAWN AEROBIC AND ANAEROBIC Blood Culture results may not be optimal due to an excessive volume of blood received in culture bottles Performed at [facility], [address] [place], [place] [address]    Culture  Setup Time   Final    GRAM NEGATIVE RODS AEROBIC BOTTLE ONLY CRITICAL VALUE NOTED.  VALUE IS CONSISTENT WITH PREVIOUSLY REPORTED AND CALLED VALUE. Performed at [facility], [address] [place], [place] [address]    Culture    Final    Romie MinusGRAM NEGATIVE RODS IDENTIFICATION AND SUSCEPTIBILITIES TO FOLLOW Performed at [facility], [address] [place], [place] [address]    Report Status PENDING  Incomplete  Blood culture (routine x 2)     Status: None (Preliminary result)   Collection Time: 08/04/19 10:32 PM   Specimen: BLOOD  Result Value Ref Range Status   Specimen Description   Final    BLOOD RIGHT FOREARM Performed at [facility], [address] [place], [place] [address]    Special Requests   Final    BOTTLES DRAWN AEROBIC AND ANAEROBIC Blood Culture adequate volume Performed at [facility], [address] [place], [place] [address]    Culture  Setup Time   Final    Organism ID to follow GRAM NEGATIVE RODS AEROBIC BOTTLE ONLY CRITICAL RESULT CALLED TO, READ BACK BY AND VERIFIED WITH: CHRISTINE KATSOUDAS AT 1309 ON 08/05/2019 [facility]. Performed at [facility], [address] [place], [place] [address]    Culture   Final    Romie MinusGRAM NEGATIVE RODS IDENTIFICATION AND SUSCEPTIBILITIES TO FOLLOW Performed at [facility], [address] [place], [place] [address]    Report Status PENDING  Incomplete  SARS Coronavirus 2 [facility](Hospital order, Performed in [facility] hospital lab) Nasopharyngeal     Status:  None   Collection Time: 08/04/19 10:32 PM   Specimen: Nasopharyngeal  Result Value Ref Range Status   SARS Coronavirus 2 NEGATIVE NEGATIVE Final    Comment: (NOTE) If result is NEGATIVE SARS-CoV-2 target nucleic acids are NOT DETECTED. The SARS-CoV-2 RNA is generally detectable in upper and lower  respiratory specimens during the acute phase of infection. The lowest  concentration of SARS-CoV-2 viral copies this assay can detect is 250  copies / mL. A negative result does not preclude SARS-CoV-2 infection  and should not be used as the sole basis for treatment or other  patient management decisions.  A negative result may occur with  improper specimen  collection / handling, submission of specimen other  than nasopharyngeal swab, presence of viral mutation(s) within the  areas targeted by this assay, and inadequate number of viral copies  (<250 copies / mL). A negative result must be combined with clinical  observations, patient history, and epidemiological information. If result is POSITIVE SARS-CoV-2 target nucleic acids are DETECTED. The SARS-CoV-2 RNA is generally detectable in upper and lower  respiratory specimens dur ing the acute phase of infection.  Positive  results are indicative of active infection with SARS-CoV-2.  Clinical  correlation with patient history and other diagnostic information is  necessary to determine patient infection status.  Positive results do  not rule out bacterial infection or co-infection with other viruses. If result is PRESUMPTIVE POSTIVE SARS-CoV-2 nucleic acids MAY BE PRESENT.   A presumptive positive result was obtained on the submitted specimen  and confirmed on repeat testing.  While 2019 novel coronavirus  (SARS-CoV-2) nucleic acids may be present in the submitted sample  additional confirmatory testing may be necessary for epidemiological  and / or clinical management purposes  to differentiate between  SARS-CoV-2 and other Sarbecovirus currently known to infect humans.  If clinically indicated additional testing with an alternate test  methodology [phone]([id number]) is advised. The SARS-CoV-2 RNA is generally  detectable in upper and lower respiratory sp ecimens during the acute  phase of infection. The expected result is Negative. Fact Sheet for Patients:  BoilerBrush.com.cyhttps://www.fda.gov/media/136312/download Fact Sheet for Healthcare Providers: https://pope.com/https://www.fda.gov/media/136313/download This test is not yet approved or cleared by the [place] FDA and has been authorized for detection and/or diagnosis of SARS-CoV-2 by FDA under an Emergency Use Authorization (EUA).  This EUA will remain in effect  (meaning this test can be used) for the duration of the COVID-19 declaration under Section 564(b)(1) of the Act, 21 U.S.C. section 360bbb-3(b)(1), unless the authorization is terminated or revoked sooner. Performed at [facility], [address] [place], [place] [address]   Urine Culture     Status: Abnormal   Collection Time: 08/04/19 10:32 PM   Specimen: Urine, Random  Result Value Ref Range Status   Specimen Description   Final    URINE, RANDOM Performed at [facility], [address] [place], [place] [address]    Special Requests   Final    NONE Performed at [facility], [address] [place], [place] [address]    Culture >=100,000 COLONIES/mL ESCHERICHIA COLI (A)  Final   Report Status 08/06/2019 FINAL  Final   Organism ID, Bacteria ESCHERICHIA COLI (A)  Final      Susceptibility   Escherichia coli - MIC*    AMPICILLIN >=32 RESISTANT Resistant     CEFAZOLIN <=4 SENSITIVE Sensitive     CEFTRIAXONE <=1 SENSITIVE Sensitive     CIPROFLOXACIN 1 SENSITIVE Sensitive  GENTAMICIN <=1 SENSITIVE Sensitive     IMIPENEM <=0.25 SENSITIVE Sensitive     NITROFURANTOIN <=16 SENSITIVE Sensitive     TRIMETH/SULFA <=20 SENSITIVE Sensitive     AMPICILLIN/SULBACTAM 16 INTERMEDIATE Intermediate     PIP/TAZO <=4 SENSITIVE Sensitive     Extended ESBL NEGATIVE Sensitive     * >=100,000 COLONIES/mL ESCHERICHIA COLI  Blood Culture ID Panel (Reflexed)     Status: Abnormal   Collection Time: 08/04/19 10:32 PM  Result Value Ref Range Status   Enterococcus species NOT DETECTED NOT DETECTED Final   Listeria monocytogenes NOT DETECTED NOT DETECTED Final   Staphylococcus species NOT DETECTED NOT DETECTED Final   Staphylococcus aureus (BCID) NOT DETECTED NOT DETECTED Final   Streptococcus species NOT DETECTED NOT DETECTED Final   Streptococcus agalactiae NOT DETECTED NOT DETECTED Final   Streptococcus pneumoniae NOT DETECTED NOT DETECTED Final   Streptococcus  pyogenes NOT DETECTED NOT DETECTED Final   Acinetobacter baumannii NOT DETECTED NOT DETECTED Final   Enterobacteriaceae species DETECTED (A) NOT DETECTED Final    Comment: Enterobacteriaceae represent a large family of gram-negative bacteria, not a single organism. CRITICAL RESULT CALLED TO, READ BACK BY AND VERIFIED WITH: CHRISTINE KATSOUDAS AT 1062 ON 08/05/2019 [facility].    Enterobacter cloacae complex NOT DETECTED NOT DETECTED Final   Escherichia coli NOT DETECTED NOT DETECTED Final   Klebsiella oxytoca NOT DETECTED NOT DETECTED Final   Klebsiella pneumoniae DETECTED (A) NOT DETECTED Final    Comment: CRITICAL RESULT CALLED TO, READ BACK BY AND VERIFIED WITH: CHRISTINE KATSOUDAS AT 1309 ON 08/05/2019 [place].    Proteus species NOT DETECTED NOT DETECTED Final   Serratia marcescens NOT DETECTED NOT DETECTED Final   Carbapenem resistance NOT DETECTED NOT DETECTED Final   Haemophilus influenzae NOT DETECTED NOT DETECTED Final   Neisseria meningitidis NOT DETECTED NOT DETECTED Final   Pseudomonas aeruginosa NOT DETECTED NOT DETECTED Final   Candida albicans NOT DETECTED NOT DETECTED Final   Candida glabrata NOT DETECTED NOT DETECTED Final   Candida krusei NOT DETECTED NOT DETECTED Final   Candida parapsilosis NOT DETECTED NOT DETECTED Final   Candida tropicalis NOT DETECTED NOT DETECTED Final    Comment: Performed at [facility], [address] [place], [place] [address]   Studies/Results: Dg Chest 2 View  Result Date: 08/04/2019 CLINICAL DATA:  Fever nausea vomiting EXAM: CHEST - 2 VIEW COMPARISON:  12/14/2018 FINDINGS: Linear scarring or atelectasis at the left base. No focal consolidation or pleural effusion. Stable cardiomediastinal silhouette with aortic atherosclerosis. No pneumothorax. IMPRESSION: No active cardiopulmonary disease. Mild scarring or atelectasis at the left lung base. Electronically Signed   By: Donavan Foil M.D.   On: 08/04/2019 21:22   Mr 3d Recon At  Scanner  Result Date: 08/05/2019 CLINICAL DATA:  Right upper quadrant pain.  Cholecystitis suspected. EXAM: MRI ABDOMEN WITHOUT AND WITH CONTRAST (INCLUDING MRCP) TECHNIQUE: Multiplanar multisequence MR imaging of the abdomen was performed both before and after the administration of intravenous contrast. Heavily T2-weighted images of the biliary and pancreatic ducts were obtained, and three-dimensional MRCP images were rendered by post processing. CONTRAST:  63mL GADAVIST GADOBUTROL 1 MMOL/ML IV SOLN COMPARISON:  Ultrasound exam 08/04/2019.  Abdominal CT 10/06/2016. FINDINGS: Lower chest: Unremarkable. Hepatobiliary: No suspicious focal abnormality within the liver parenchyma. Gallbladder is distended. Multiple layering tiny gallstones evident measuring up to 4-5 mm on a background of layering gallbladder sludge. Common duct is nondilated. Common bile duct in the head of the pancreas measures 5 mm, within  normal limits. 3 mm stone is identified in the distal common bile duct (well seen on coronal MRCP image 25/15 and axial T2 haste image 24/4). There may possibly be another stone directly adjacent to the ampulla. Pancreas: Multiple tiny cystic lesions are noted in the tail of pancreas measuring 3-4 mm and 1 of these appears to communicate with the main duct on axial T2 haste 19/4. Spleen: No splenomegaly. Small cyst or infarct noted in the subcapsular parenchyma. Adrenals/Urinary Tract: No adrenal nodule or mass. Tiny cortical lesions in both kidneys are too small to characterize but likely represent tiny cysts. No hydronephrosis. Stomach/Bowel: Stomach is unremarkable. No gastric wall thickening. No evidence of outlet obstruction. Duodenum is normally positioned as is the ligament of Treitz. No small bowel or colonic dilatation within the visualized abdomen. Advanced diverticular disease noted in the abdominal segments of the colon. Vascular/Lymphatic: No abdominal aortic aneurysm. Portal vein, superior mesenteric  vein, and splenic vein are patent. No abdominal lymphadenopathy. Other:  No intraperitoneal free fluid. Musculoskeletal: No abnormal marrow enhancement within the visualized bony anatomy. IMPRESSION: 1. Distended gallbladder with layering tiny stones and sludge. No substantial gallbladder wall thickening or pericholecystic fluid by MRI. Common bile duct measures up to 5 mm with 3 mm distal common bile duct stone clearly visualized. There may be a second tiny stone immediately adjacent to the ampulla. 2. Tiny cystic foci in the tail of the pancreas, 1 of which may communicate with the main pancreatic duct. For cystic lesions of this size in a patient of this age, consensus criteria recommend repeat imaging in 2 years. This recommendation follows ACR consensus guidelines: Management of Incidental Pancreatic Cysts: A White Paper of the ACR Incidental Findings Committee. J Am Coll Radiol 2017;14:911-923. 3. Colonic diverticulosis. Electronically Signed   By: Kennith Center M.D.   On: 08/05/2019 12:28   Mr Abdomen Mrcp Vivien Rossetti Contast  Result Date: 08/05/2019 CLINICAL DATA:  Right upper quadrant pain.  Cholecystitis suspected. EXAM: MRI ABDOMEN WITHOUT AND WITH CONTRAST (INCLUDING MRCP) TECHNIQUE: Multiplanar multisequence MR imaging of the abdomen was performed both before and after the administration of intravenous contrast. Heavily T2-weighted images of the biliary and pancreatic ducts were obtained, and three-dimensional MRCP images were rendered by post processing. CONTRAST:  5mL GADAVIST GADOBUTROL 1 MMOL/ML IV SOLN COMPARISON:  Ultrasound exam 08/04/2019.  Abdominal CT 10/06/2016. FINDINGS: Lower chest: Unremarkable. Hepatobiliary: No suspicious focal abnormality within the liver parenchyma. Gallbladder is distended. Multiple layering tiny gallstones evident measuring up to 4-5 mm on a background of layering gallbladder sludge. Common duct is nondilated. Common bile duct in the head of the pancreas measures 5 mm,  within normal limits. 3 mm stone is identified in the distal common bile duct (well seen on coronal MRCP image 25/15 and axial T2 haste image 24/4). There may possibly be another stone directly adjacent to the ampulla. Pancreas: Multiple tiny cystic lesions are noted in the tail of pancreas measuring 3-4 mm and 1 of these appears to communicate with the main duct on axial T2 haste 19/4. Spleen: No splenomegaly. Small cyst or infarct noted in the subcapsular parenchyma. Adrenals/Urinary Tract: No adrenal nodule or mass. Tiny cortical lesions in both kidneys are too small to characterize but likely represent tiny cysts. No hydronephrosis. Stomach/Bowel: Stomach is unremarkable. No gastric wall thickening. No evidence of outlet obstruction. Duodenum is normally positioned as is the ligament of Treitz. No small bowel or colonic dilatation within the visualized abdomen. Advanced diverticular disease noted in the abdominal  segments of the colon. Vascular/Lymphatic: No abdominal aortic aneurysm. Portal vein, superior mesenteric vein, and splenic vein are patent. No abdominal lymphadenopathy. Other:  No intraperitoneal free fluid. Musculoskeletal: No abnormal marrow enhancement within the visualized bony anatomy. IMPRESSION: 1. Distended gallbladder with layering tiny stones and sludge. No substantial gallbladder wall thickening or pericholecystic fluid by MRI. Common bile duct measures up to 5 mm with 3 mm distal common bile duct stone clearly visualized. There may be a second tiny stone immediately adjacent to the ampulla. 2. Tiny cystic foci in the tail of the pancreas, 1 of which may communicate with the main pancreatic duct. For cystic lesions of this size in a patient of this age, consensus criteria recommend repeat imaging in 2 years. This recommendation follows ACR consensus guidelines: Management of Incidental Pancreatic Cysts: A White Paper of the ACR Incidental Findings Committee. J Am Coll Radiol  2017;14:911-923. 3. Colonic diverticulosis. Electronically Signed   By: Kennith Center M.D.   On: 08/05/2019 12:28   US Abdomen Limited Ruq  Result Date: 08/04/2019 CLINICAL DATA:  Upper abdominal pain EXAM: ULTRASOUND ABDOMEN LIMITED RIGHT UPPER QUADRANT COMPARISON:  None. FINDINGS: Gallbladder: Fluid-filled and distended with layering sludge and small gallbladder stones. The gallbladder wall is mildly prominent measuring 3.4-4 mm. The patient does have a sonographic Murphy sign. Common bile duct: Diameter: 7 mm Liver: No focal lesion identified. Within normal limits in parenchymal echogenicity. Portal vein is patent on color Doppler imaging with normal direction of blood flow towards the liver. Other: None. IMPRESSION: Findings which could be suggestive of acute cholecystitis as described above. Electronically Signed   By: Jonna Clark M.D.   On: 08/04/2019 23:28   Medications: I have reviewed the patient's current medications. Scheduled Meds:  docusate sodium  100 mg Oral BID   heparin  5,000 Units Subcutaneous Q8H   mometasone-formoterol  2 puff Inhalation BID   promethazine  12.5 mg Intravenous Once   Continuous Infusions:  sodium chloride Stopped (08/06/19 0508)   0.9 % NaCl with KCl 20 mEq / L     piperacillin-tazobactam (ZOSYN)  IV 3.375 g (08/06/19 0508)   PRN Meds:.sodium chloride, acetaminophen **OR** acetaminophen, albuterol, morphine injection, nitroGLYCERIN, ondansetron **OR** ondansetron (ZOFRAN) IV   Assessment: Active Problems:   Cholecystitis   Choledocholithiasis [place] Stricker is a 85 y.o. y/o female admitted with acute abdominal pain, elevated liver function tests including a total bilirubin of 2.6, direct component 1.6.  Elevated transaminases and alkaline phosphatase.  Right upper quadrant ultrasound shows a common bile duct diameter of 7 mm.  There are features of acute cholecystitis and sonographic evidence of positive Murphy's sign.  I have been consulted  for choledocholithiasis. MRCP shows cholidocholithiasis - Will need ERCP - patient on Asprin and plavix.  Seen by surgery for cholecystectomy.. Afebrile, no leukocytosis, total bilirubin trending down.  Plan 1.  ERCP on Thursday has been scheduled: Plavix has been held.       LOS: 1 day   Tanya Mood, MD 08/06/2019, 9:47 AM

## 2019-08-06 NOTE — Progress Notes (Addendum)
[facility] [place] NAME: Vita Currin    MR#:  [id number]  DATE OF BIRTH:  [birth year]  SUBJECTIVE:  CHIEF COMPLAINT:   Chief Complaint  Patient presents with  . Nausea  . Abdominal Pain  Patient seen and evaluated today Has decreased abdominal pain Has no nausea No fever  REVIEW OF SYSTEMS:    ROS  CONSTITUTIONAL: No documented fever. Has fatigue, weakness. No weight gain, no weight loss.  EYES: No blurry or double vision.  ENT: No tinnitus. No postnasal drip. No redness of the oropharynx.  RESPIRATORY: No cough, no wheeze, no hemoptysis. No dyspnea.  CARDIOVASCULAR: No chest pain. No orthopnea. No palpitations. No syncope.  GASTROINTESTINAL: Has nausea, no vomiting or diarrhea. Has decreased abdominal pain. No melena or hematochezia.  GENITOURINARY: No dysuria or hematuria.  ENDOCRINE: No polyuria or nocturia. No heat or cold intolerance.  HEMATOLOGY: No anemia. No bruising. No bleeding.  INTEGUMENTARY: No rashes. No lesions.  MUSCULOSKELETAL: No arthritis. No swelling. No gout.  NEUROLOGIC: No numbness, tingling, or ataxia. No seizure-type activity.  PSYCHIATRIC: No anxiety. No insomnia. No ADD.   DRUG ALLERGIES:   Allergies  Allergen Reactions  . Tramadol Itching and Nausea And Vomiting    VITALS:  Blood pressure (!) 126/51, pulse 63, temperature 98.3 F (36.8 C), temperature source Oral, resp. rate 20, height 5\' 1"  (1.549 m), weight 53.4 kg, SpO2 97 %.  PHYSICAL EXAMINATION:   Physical Exam  GENERAL:  85 y.o.-year-old patient lying in the bed with no acute distress.  EYES: Pupils equal, round, reactive to light and accommodation. No scleral icterus. Extraocular muscles intact.  HEENT: Head atraumatic, normocephalic. Oropharynx and nasopharynx clear.  NECK:  Supple, no jugular venous distention. No thyroid enlargement, no tenderness.  LUNGS: Normal breath sounds bilaterally, no wheezing, rales, rhonchi. No use of  accessory muscles of respiration.  CARDIOVASCULAR: S1, S2 normal. No murmurs, rubs, or gallops.  ABDOMEN: Soft, tenderness decreased right upper quadrant, nondistended. Bowel sounds decreased. No organomegaly or mass.  EXTREMITIES: No cyanosis, clubbing or edema b/l.    NEUROLOGIC: Cranial nerves II through XII are intact. No focal Motor or sensory deficits b/l.   PSYCHIATRIC: The patient is alert and oriented x 3.  SKIN: No obvious rash, lesion, or ulcer.   LABORATORY PANEL:   CBC Recent Labs  Lab 08/06/19 0518  WBC 9.8  HGB 10.0*  HCT 31.1*  PLT 197   ------------------------------------------------------------------------------------------------------------------ Chemistries  Recent Labs  Lab 08/06/19 0518  NA 139  K 3.1*  CL 111  CO2 23  GLUCOSE 94  BUN 14  CREATININE 0.68  CALCIUM 7.1*  AST 219*  ALT 233*  ALKPHOS 244*  BILITOT 2.3*   ------------------------------------------------------------------------------------------------------------------  Cardiac Enzymes No results for input(s): TROPONINI in the last 168 hours. ------------------------------------------------------------------------------------------------------------------  RADIOLOGY:  Dg Chest 2 View  Result Date: 08/04/2019 CLINICAL DATA:  Fever nausea vomiting EXAM: CHEST - 2 VIEW COMPARISON:  12/14/2018 FINDINGS: Linear scarring or atelectasis at the left base. No focal consolidation or pleural effusion. Stable cardiomediastinal silhouette with aortic atherosclerosis. No pneumothorax. IMPRESSION: No active cardiopulmonary disease. Mild scarring or atelectasis at the left lung base. Electronically Signed   By: Donavan Foil M.D.   On: 08/04/2019 21:22   Mr 3d Recon At Scanner  Result Date: 08/05/2019 CLINICAL DATA:  Right upper quadrant pain.  Cholecystitis suspected. EXAM: MRI ABDOMEN WITHOUT AND WITH CONTRAST (INCLUDING MRCP) TECHNIQUE: Multiplanar multisequence MR imaging of the abdomen was  performed both before and after the administration of intravenous contrast. Heavily T2-weighted images of the biliary and pancreatic ducts were obtained, and three-dimensional MRCP images were rendered by post processing. CONTRAST:  68mL GADAVIST GADOBUTROL 1 MMOL/ML IV SOLN COMPARISON:  Ultrasound exam 08/04/2019.  Abdominal CT 10/06/2016. FINDINGS: Lower chest: Unremarkable. Hepatobiliary: No suspicious focal abnormality within the liver parenchyma. Gallbladder is distended. Multiple layering tiny gallstones evident measuring up to 4-5 mm on a background of layering gallbladder sludge. Common duct is nondilated. Common bile duct in the head of the pancreas measures 5 mm, within normal limits. 3 mm stone is identified in the distal common bile duct (well seen on coronal MRCP image 25/15 and axial T2 haste image 24/4). There may possibly be another stone directly adjacent to the ampulla. Pancreas: Multiple tiny cystic lesions are noted in the tail of pancreas measuring 3-4 mm and 1 of these appears to communicate with the main duct on axial T2 haste 19/4. Spleen: No splenomegaly. Small cyst or infarct noted in the subcapsular parenchyma. Adrenals/Urinary Tract: No adrenal nodule or mass. Tiny cortical lesions in both kidneys are too small to characterize but likely represent tiny cysts. No hydronephrosis. Stomach/Bowel: Stomach is unremarkable. No gastric wall thickening. No evidence of outlet obstruction. Duodenum is normally positioned as is the ligament of Treitz. No small bowel or colonic dilatation within the visualized abdomen. Advanced diverticular disease noted in the abdominal segments of the colon. Vascular/Lymphatic: No abdominal aortic aneurysm. Portal vein, superior mesenteric vein, and splenic vein are patent. No abdominal lymphadenopathy. Other:  No intraperitoneal free fluid. Musculoskeletal: No abnormal marrow enhancement within the visualized bony anatomy. IMPRESSION: 1. Distended gallbladder with  layering tiny stones and sludge. No substantial gallbladder wall thickening or pericholecystic fluid by MRI. Common bile duct measures up to 5 mm with 3 mm distal common bile duct stone clearly visualized. There may be a second tiny stone immediately adjacent to the ampulla. 2. Tiny cystic foci in the tail of the pancreas, 1 of which may communicate with the main pancreatic duct. For cystic lesions of this size in a patient of this age, consensus criteria recommend repeat imaging in 2 years. This recommendation follows ACR consensus guidelines: Management of Incidental Pancreatic Cysts: A White Paper of the ACR Incidental Findings Committee. J Am Coll Radiol 2017;14:911-923. 3. Colonic diverticulosis. Electronically Signed   By: Kennith Center M.D.   On: 08/05/2019 12:28   Mr Abdomen Mrcp Vivien Rossetti Contast  Result Date: 08/05/2019 CLINICAL DATA:  Right upper quadrant pain.  Cholecystitis suspected. EXAM: MRI ABDOMEN WITHOUT AND WITH CONTRAST (INCLUDING MRCP) TECHNIQUE: Multiplanar multisequence MR imaging of the abdomen was performed both before and after the administration of intravenous contrast. Heavily T2-weighted images of the biliary and pancreatic ducts were obtained, and three-dimensional MRCP images were rendered by post processing. CONTRAST:  74mL GADAVIST GADOBUTROL 1 MMOL/ML IV SOLN COMPARISON:  Ultrasound exam 08/04/2019.  Abdominal CT 10/06/2016. FINDINGS: Lower chest: Unremarkable. Hepatobiliary: No suspicious focal abnormality within the liver parenchyma. Gallbladder is distended. Multiple layering tiny gallstones evident measuring up to 4-5 mm on a background of layering gallbladder sludge. Common duct is nondilated. Common bile duct in the head of the pancreas measures 5 mm, within normal limits. 3 mm stone is identified in the distal common bile duct (well seen on coronal MRCP image 25/15 and axial T2 haste image 24/4). There may possibly be another stone directly adjacent to the ampulla. Pancreas:  Multiple tiny cystic lesions are noted in the tail  of pancreas measuring 3-4 mm and 1 of these appears to communicate with the main duct on axial T2 haste 19/4. Spleen: No splenomegaly. Small cyst or infarct noted in the subcapsular parenchyma. Adrenals/Urinary Tract: No adrenal nodule or mass. Tiny cortical lesions in both kidneys are too small to characterize but likely represent tiny cysts. No hydronephrosis. Stomach/Bowel: Stomach is unremarkable. No gastric wall thickening. No evidence of outlet obstruction. Duodenum is normally positioned as is the ligament of Treitz. No small bowel or colonic dilatation within the visualized abdomen. Advanced diverticular disease noted in the abdominal segments of the colon. Vascular/Lymphatic: No abdominal aortic aneurysm. Portal vein, superior mesenteric vein, and splenic vein are patent. No abdominal lymphadenopathy. Other:  No intraperitoneal free fluid. Musculoskeletal: No abnormal marrow enhancement within the visualized bony anatomy. IMPRESSION: 1. Distended gallbladder with layering tiny stones and sludge. No substantial gallbladder wall thickening or pericholecystic fluid by MRI. Common bile duct measures up to 5 mm with 3 mm distal common bile duct stone clearly visualized. There may be a second tiny stone immediately adjacent to the ampulla. 2. Tiny cystic foci in the tail of the pancreas, 1 of which may communicate with the main pancreatic duct. For cystic lesions of this size in a patient of this age, consensus criteria recommend repeat imaging in 2 years. This recommendation follows ACR consensus guidelines: Management of Incidental Pancreatic Cysts: A White Paper of the ACR Incidental Findings Committee. J Am Coll Radiol 2017;14:911-923. 3. Colonic diverticulosis. Electronically Signed   By: Kennith CenterEric  Mansell M.D.   On: 08/05/2019 12:28   [place] Abdomen Limited Ruq  Result Date: 08/04/2019 CLINICAL DATA:  Upper abdominal pain EXAM: ULTRASOUND ABDOMEN LIMITED RIGHT  UPPER QUADRANT COMPARISON:  None. FINDINGS: Gallbladder: Fluid-filled and distended with layering sludge and small gallbladder stones. The gallbladder wall is mildly prominent measuring 3.4-4 mm. The patient does have a sonographic Murphy sign. Common bile duct: Diameter: 7 mm Liver: No focal lesion identified. Within normal limits in parenchymal echogenicity. Portal vein is patent on color Doppler imaging with normal direction of blood flow towards the liver. Other: None. IMPRESSION: Findings which could be suggestive of acute cholecystitis as described above. Electronically Signed   By: Jonna ClarkBindu  Avutu M.D.   On: 08/04/2019 23:28     ASSESSMENT AND PLAN:  85 year old female patient with history of coronary disease, type 2 diabetes mellitus, hypertension, bronchial asthma, polymyalgia rheumatica currently under hospitalist service.  -Acute cholecystitis Appreciate surgery evaluation Elective cholecystectomy at later date Continue IV Rocephin and Flagyl antibiotic Follow-up WBC count Check MRCP abdomen for assessment of choledocholithiasis IV fluids Clear liquid diet  -choledocholithiaisis MR abdomen reviewed GI planning ercp on Thursday  -Ecoli UTI Continue zosyn abx  -Klebsiella bacteremia On IV zosyn abx  -Hypotension Blood pressure borderline Hold blood pressure meds  -Abnormal LFT secondary to cholecystitis Monitor LFTs and GI follow-up  -Acute hypokalemia Replace potassium  -Coronary disease Hold aspirin and Plavix for now Patient may need surgery if no resolution  -DVT prophylaxis With subcu heparin  All the records are reviewed and case discussed with Care Management/Social Worker. Management plans discussed with the patient, family and they are in agreement.  CODE STATUS: DNR  DVT Prophylaxis: SCDs  TOTAL TIME TAKING CARE OF THIS PATIENT: 37 minutes.   POSSIBLE D/C IN 2 to 3 DAYS, DEPENDING ON CLINICAL CONDITION.  Ihor AustinPavan  M.D on 08/06/2019 at 10:25  AM  Between 7am to 6pm - Pager - [phone]  After 6pm go to [email or web address] - password  [email or web address]  [facility] [place] [facility]  Office  [phone]  CC: Primary care physician; Dale DurhamScott, Charlene, MD  Note: This dictation was prepared with Dragon dictation along with smaller phrase technology. Any transcriptional errors that result from this process are unintentional.

## 2019-08-06 NOTE — Progress Notes (Signed)
[facility] SURGICAL PROGRESS NOTE (cpt [phone])  Hospital Day(s): 1.   Interval History: Patient seen and examined, no acute events or new complaints overnight. Patient reports she is feeling better this morning with improvement in abdominal discomfort. No reports of fever, chills, juandice, itching, darker urine, nausea, or emesis. She did have elevated LFTs and hyperbilirubinemia this morning. MRCP yesterday was concerning for choledocholithiasis with plans for ERCP on 10/08 with Dr Allen Norris given that she is on ASA and Plavix. No other acute issues.   Review of Systems:  Constitutional: denies fever, chills  HEENT: denies cough or congestion  Respiratory: denies any shortness of breath  Cardiovascular: denies chest pain or palpitations  Gastrointestinal: denies abdominal pain, N/V, or diarrhea/and bowel function as per interval history Genitourinary: denies burning with urination or urinary frequency Integumentary: denies itching   Vital signs in last 24 hours: [min-max] current  Temp:  [98 F (36.7 C)-98.7 F (37.1 C)] 98.3 F (36.8 C) (10/06 0900) Pulse Rate:  [61-74] 63 (10/06 0900) Resp:  [17-20] 20 (10/06 0900) BP: (97-126)/(49-62) 126/51 (10/06 0900) SpO2:  [95 %-97 %] 97 % (10/06 0900) Weight:  [53.4 kg] 53.4 kg (10/06 0500)     Height: 5\' 1"  (154.9 cm) Weight: 53.4 kg BMI (Calculated): 22.26   Intake/Output last 2 shifts:  10/05 0701 - 10/06 0700 In: 2567 [I.V.:2456.8; IV Piggyback:110.2] Out: 300 [Urine:300]   Physical Exam:  Constitutional: alert, cooperative and no distress  HENT: normocephalic without obvious abnormality Eyes: PERRL, EOM's grossly intact and symmetric, no scleral icterus   Respiratory: breathing non-labored at rest  Cardiovascular: regular rate and sinus rhythm  Gastrointestinal: soft, non-tender, and non-distended, no rebound/guarding Musculoskeletal: no edema or wounds, motor and sensation grossly intact, NT  Integumentary: Warm,  dry, no juandice   Labs:  CBC Latest Ref Rng & Units 08/06/2019 08/04/2019 05/24/2019  WBC 4.0 - 10.5 K/uL 9.8 9.9 8.7  Hemoglobin 12.0 - 15.0 g/dL 10.0(L) 12.8 13.4  Hematocrit 36.0 - 46.0 % 31.1(L) 39.7 40.1  Platelets 150 - 400 K/uL 197 256 350.0   CMP Latest Ref Rng & Units 08/06/2019 08/04/2019 08/04/2019  Glucose 70 - 99 mg/dL 94 - 118(H)  BUN 8 - 23 mg/dL 14 - 18  Creatinine 0.44 - 1.00 mg/dL 0.68 - 0.53  Sodium 135 - 145 mmol/L 139 - 142  Potassium 3.5 - 5.1 mmol/L 3.1(L) - 3.5  Chloride 98 - 111 mmol/L 111 - 106  CO2 22 - 32 mmol/L 23 - 22  Calcium 8.9 - 10.3 mg/dL 7.1(L) - 8.5(L)  Total Protein 6.5 - 8.1 g/dL 5.4(L) 7.0 6.7  Total Bilirubin 0.3 - 1.2 mg/dL 2.3(H) 2.8(H) 2.6(H)  Alkaline Phos 38 - 126 U/L 244(H) 317(H) 312(H)  AST 15 - 41 U/L 219(H) 433(H) 424(H)  ALT 0 - 44 U/L 233(H) 294(H) 292(H)     Imaging studies:   MRCP (08/05/2019) personally reviewed with choledocholithiasis and radiologist report reviewed below:  IMPRESSION: 1. Distended gallbladder with layering tiny stones and sludge. No substantial gallbladder wall thickening or pericholecystic fluid by MRI. Common bile duct measures up to 5 mm with 3 mm distal common bile duct stone clearly visualized. There may be a second tiny stone immediately adjacent to the ampulla. 2. Tiny cystic foci in the tail of the pancreas, 1 of which may communicate with the main pancreatic duct. For cystic lesions of this size in a patient of this age, consensus criteria recommend repeat imaging in 2 years. This recommendation follows ACR  consensus guidelines: Management of Incidental Pancreatic Cysts: A White Paper of the ACR Incidental Findings Committee. J Am Coll Radiol 2017;14:911-923. 3. Colonic diverticulosis.   Assessment/Plan: (ICD-10's: K82.50) 85 y.o. female with improved abdominal pain but still with elevated LFTs and hyperbilirubinemia attributable to choledocholithiasis seen on MRCP w/o evidence of  cholecystitis, whic is complicated by her cardiac history including MI in 09/2018 requiring drug eluting stent and need for Plavix.    - Okay for CLD today; ADAT   - NPO at midnight on 10/08 for ERCP   - replete K+   - Continue IVF  - Continue IV Abx (Zosyn) for now ; BCx growing klebsiella Pneumoniae and enterobacteriaceae  - pain control prn; antiemetics prn  - monitor abdominal examination  - trend LFTs, Bilirubin  - Plan for ERCP with Dr Servando Snare on 10/08  - No surgical intervention this admission. We will follow her outpatient for interval cholecystectomy once we obtain cardiac clearance, which she understands   - mobilization encouraged  - further management per primary and consulting services.    All of the above findings and recommendations were discussed with the patient, and the medical team, and all of patient's questions were answered to her expressed satisfaction.  -- Lynden Oxford, PA-C [facility] 08/06/2019, 10:03 AM [phone] M-F: 7am - 4pm

## 2019-08-06 NOTE — Progress Notes (Signed)
MD notified: potassium is 3.1 this morning would you like to order potassium replacement.

## 2019-08-06 NOTE — TOC Progression Note (Signed)
Transition of Care [facility]) - Progression Note    Patient Details  Name: Tanya Cole MRN: [id number] Date of Birth: [birth year]  [facility] CM/SW Contact  Katrina Stack, RN Phone Number: 08/06/2019, 8:17 PM  Clinical Narrative:   For ERCP on 10/8. Unable to perform before this time as Plavix and aspirin had to be held.         Expected Discharge Plan and Services                                                 Social Determinants of Health (SDOH) Interventions    Readmission Risk Interventions No flowsheet data found.

## 2019-08-07 LAB — COMPREHENSIVE METABOLIC PANEL
ALT: 188 U/L — ABNORMAL HIGH (ref 0–44)
AST: 138 U/L — ABNORMAL HIGH (ref 15–41)
Albumin: 3 g/dL — ABNORMAL LOW (ref 3.5–5.0)
Alkaline Phosphatase: 239 U/L — ABNORMAL HIGH (ref 38–126)
Anion gap: 7 (ref 5–15)
BUN: 7 mg/dL — ABNORMAL LOW (ref 8–23)
CO2: 20 mmol/L — ABNORMAL LOW (ref 22–32)
Calcium: 8 mg/dL — ABNORMAL LOW (ref 8.9–10.3)
Chloride: 115 mmol/L — ABNORMAL HIGH (ref 98–111)
Creatinine, Ser: 0.47 mg/dL (ref 0.44–1.00)
GFR calc Af Amer: >60 mL/min (ref >60)
GFR calc non Af Amer: >60 mL/min (ref >60)
Glucose, Bld: 97 mg/dL (ref 70–99)
Potassium: 3.7 mmol/L (ref 3.5–5.1)
Sodium: 142 mmol/L (ref 135–145)
Total Bilirubin: 1.3 mg/dL — ABNORMAL HIGH (ref 0.3–1.2)
Total Protein: 5.6 g/dL — ABNORMAL LOW (ref 6.5–8.1)

## 2019-08-07 LAB — CULTURE, BLOOD (ROUTINE X 2): Special Requests: ADEQUATE

## 2019-08-07 MED ORDER — SODIUM CHLORIDE 0.9 % IV SOLN
2.0000 g | INTRAVENOUS | Status: DC
  Administered 2019-08-07 – 2019-08-09 (×3): 2 g via INTRAVENOUS
  Filled 2019-08-07 (×3): qty 2

## 2019-08-07 MED ORDER — METOPROLOL TARTRATE 25 MG PO TABS
12.5000 mg | ORAL_TABLET | Freq: Two times a day (BID) | ORAL | Status: DC
  Administered 2019-08-07 – 2019-08-09 (×4): 12.5 mg via ORAL
  Filled 2019-08-07 (×4): qty 1

## 2019-08-07 NOTE — Progress Notes (Signed)
[facility] [place] [facility]   PATIENT NAME: Tanya Cole    MR#:  [id number]  DATE OF BIRTH:  1933-05-08  SUBJECTIVE:  CHIEF COMPLAINT:   Chief Complaint  Patient presents with  . Nausea  . Abdominal Pain  Patient seen and evaluated today Has no abdominal pain Has no nausea No fever On liquids   REVIEW OF SYSTEMS:    ROS  CONSTITUTIONAL: No documented fever. Has fatigue, weakness. No weight gain, no weight loss.  EYES: No blurry or double vision.  ENT: No tinnitus. No postnasal drip. No redness of the oropharynx.  RESPIRATORY: No cough, no wheeze, no hemoptysis. No dyspnea.  CARDIOVASCULAR: No chest pain. No orthopnea. No palpitations. No syncope.  GASTROINTESTINAL: Has nausea, no vomiting or diarrhea. Has decreased abdominal pain. No melena or hematochezia.  GENITOURINARY: No dysuria or hematuria.  ENDOCRINE: No polyuria or nocturia. No heat or cold intolerance.  HEMATOLOGY: No anemia. No bruising. No bleeding.  INTEGUMENTARY: No rashes. No lesions.  MUSCULOSKELETAL: No arthritis. No swelling. No gout.  NEUROLOGIC: No numbness, tingling, or ataxia. No seizure-type activity.  PSYCHIATRIC: No anxiety. No insomnia. No ADD.   DRUG ALLERGIES:   Allergies  Allergen Reactions  . Tramadol Itching and Nausea And Vomiting    VITALS:  Blood pressure (!) 141/77, pulse 82, temperature 98.1 F (36.7 C), temperature source Oral, resp. rate 19, height 5\' 1"  (1.549 m), weight 56.2 kg, SpO2 97 %.  PHYSICAL EXAMINATION:   Physical Exam  GENERAL:  86 y.o.-year-old patient lying in the bed with no acute distress.  EYES: Pupils equal, round, reactive to light and accommodation. No scleral icterus. Extraocular muscles intact.  HEENT: Head atraumatic, normocephalic. Oropharynx and nasopharynx clear.  NECK:  Supple, no jugular venous distention. No thyroid enlargement, no tenderness.  LUNGS: Normal breath sounds bilaterally, no wheezing, rales, rhonchi. No  use of accessory muscles of respiration.  CARDIOVASCULAR: S1, S2 normal. No murmurs, rubs, or gallops.  ABDOMEN: Soft, tenderness decreased right upper quadrant, nondistended. Bowel sounds decreased. No organomegaly or mass.  EXTREMITIES: No cyanosis, clubbing or edema b/l.    NEUROLOGIC: Cranial nerves II through XII are intact. No focal Motor or sensory deficits b/l.   PSYCHIATRIC: The patient is alert and oriented x 3.  SKIN: No obvious rash, lesion, or ulcer.   LABORATORY PANEL:   CBC Recent Labs  Lab 08/06/19 0518  WBC 9.8  HGB 10.0*  HCT 31.1*  PLT 197   ------------------------------------------------------------------------------------------------------------------ Chemistries  Recent Labs  Lab 08/07/19 0346  NA 142  K 3.7  CL 115*  CO2 20*  GLUCOSE 97  BUN 7*  CREATININE 0.47  CALCIUM 8.0*  AST 138*  ALT 188*  ALKPHOS 239*  BILITOT 1.3*   ------------------------------------------------------------------------------------------------------------------  Cardiac Enzymes No results for input(s): TROPONINI in the last 168 hours. ------------------------------------------------------------------------------------------------------------------  RADIOLOGY:  Mr 3d Recon At Scanner  Result Date: 08/05/2019 CLINICAL DATA:  Right upper quadrant pain.  Cholecystitis suspected. EXAM: MRI ABDOMEN WITHOUT AND WITH CONTRAST (INCLUDING MRCP) TECHNIQUE: Multiplanar multisequence MR imaging of the abdomen was performed both before and after the administration of intravenous contrast. Heavily T2-weighted images of the biliary and pancreatic ducts were obtained, and three-dimensional MRCP images were rendered by post processing. CONTRAST:  [id number] GADAVIST GADOBUTROL 1 MMOL/ML IV SOLN COMPARISON:  Ultrasound exam 08/04/2019.  Abdominal CT 10/06/2016. FINDINGS: Lower chest: Unremarkable. Hepatobiliary: No suspicious focal abnormality within the liver parenchyma. Gallbladder is distended.  Multiple layering tiny gallstones evident measuring up to  4-5 mm on a background of layering gallbladder sludge. Common duct is nondilated. Common bile duct in the head of the pancreas measures 5 mm, within normal limits. 3 mm stone is identified in the distal common bile duct (well seen on coronal MRCP image 25/15 and axial T2 haste image 24/4). There may possibly be another stone directly adjacent to the ampulla. Pancreas: Multiple tiny cystic lesions are noted in the tail of pancreas measuring 3-4 mm and 1 of these appears to communicate with the main duct on axial T2 haste 19/4. Spleen: No splenomegaly. Small cyst or infarct noted in the subcapsular parenchyma. Adrenals/Urinary Tract: No adrenal nodule or mass. Tiny cortical lesions in both kidneys are too small to characterize but likely represent tiny cysts. No hydronephrosis. Stomach/Bowel: Stomach is unremarkable. No gastric wall thickening. No evidence of outlet obstruction. Duodenum is normally positioned as is the ligament of Treitz. No small bowel or colonic dilatation within the visualized abdomen. Advanced diverticular disease noted in the abdominal segments of the colon. Vascular/Lymphatic: No abdominal aortic aneurysm. Portal vein, superior mesenteric vein, and splenic vein are patent. No abdominal lymphadenopathy. Other:  No intraperitoneal free fluid. Musculoskeletal: No abnormal marrow enhancement within the visualized bony anatomy. IMPRESSION: 1. Distended gallbladder with layering tiny stones and sludge. No substantial gallbladder wall thickening or pericholecystic fluid by MRI. Common bile duct measures up to 5 mm with 3 mm distal common bile duct stone clearly visualized. There may be a second tiny stone immediately adjacent to the ampulla. 2. Tiny cystic foci in the tail of the pancreas, 1 of which may communicate with the main pancreatic duct. For cystic lesions of this size in a patient of this age, consensus criteria recommend repeat  imaging in 2 years. This recommendation follows ACR consensus guidelines: Management of Incidental Pancreatic Cysts: A White Paper of the ACR Incidental Findings Committee. J Am Coll Radiol 4034;74:259-563. 3. Colonic diverticulosis. Electronically Signed   By: Misty Stanley M.D.   On: 08/05/2019 12:28   Mr Abdomen Mrcp Moise Boring Contast  Result Date: 08/05/2019 CLINICAL DATA:  Right upper quadrant pain.  Cholecystitis suspected. EXAM: MRI ABDOMEN WITHOUT AND WITH CONTRAST (INCLUDING MRCP) TECHNIQUE: Multiplanar multisequence MR imaging of the abdomen was performed both before and after the administration of intravenous contrast. Heavily T2-weighted images of the biliary and pancreatic ducts were obtained, and three-dimensional MRCP images were rendered by post processing. CONTRAST:  79mL GADAVIST GADOBUTROL 1 MMOL/ML IV SOLN COMPARISON:  Ultrasound exam 08/04/2019.  Abdominal CT 10/06/2016. FINDINGS: Lower chest: Unremarkable. Hepatobiliary: No suspicious focal abnormality within the liver parenchyma. Gallbladder is distended. Multiple layering tiny gallstones evident measuring up to 4-5 mm on a background of layering gallbladder sludge. Common duct is nondilated. Common bile duct in the head of the pancreas measures 5 mm, within normal limits. 3 mm stone is identified in the distal common bile duct (well seen on coronal MRCP image 25/15 and axial T2 haste image 24/4). There may possibly be another stone directly adjacent to the ampulla. Pancreas: Multiple tiny cystic lesions are noted in the tail of pancreas measuring 3-4 mm and 1 of these appears to communicate with the main duct on axial T2 haste 19/4. Spleen: No splenomegaly. Small cyst or infarct noted in the subcapsular parenchyma. Adrenals/Urinary Tract: No adrenal nodule or mass. Tiny cortical lesions in both kidneys are too small to characterize but likely represent tiny cysts. No hydronephrosis. Stomach/Bowel: Stomach is unremarkable. No gastric wall  thickening. No evidence of outlet obstruction. Duodenum  is normally positioned as is the ligament of Treitz. No small bowel or colonic dilatation within the visualized abdomen. Advanced diverticular disease noted in the abdominal segments of the colon. Vascular/Lymphatic: No abdominal aortic aneurysm. Portal vein, superior mesenteric vein, and splenic vein are patent. No abdominal lymphadenopathy. Other:  No intraperitoneal free fluid. Musculoskeletal: No abnormal marrow enhancement within the visualized bony anatomy. IMPRESSION: 1. Distended gallbladder with layering tiny stones and sludge. No substantial gallbladder wall thickening or pericholecystic fluid by MRI. Common bile duct measures up to 5 mm with 3 mm distal common bile duct stone clearly visualized. There may be a second tiny stone immediately adjacent to the ampulla. 2. Tiny cystic foci in the tail of the pancreas, 1 of which may communicate with the main pancreatic duct. For cystic lesions of this size in a patient of this age, consensus criteria recommend repeat imaging in 2 years. This recommendation follows ACR consensus guidelines: Management of Incidental Pancreatic Cysts: A White Paper of the ACR Incidental Findings Committee. J Am Coll Radiol 2017;14:911-923. 3. Colonic diverticulosis. Electronically Signed   By: Kennith Center M.D.   On: 08/05/2019 12:28     ASSESSMENT AND PLAN:  85 year old female patient with history of coronary disease, type 2 diabetes mellitus, hypertension, bronchial asthma, polymyalgia rheumatica currently under hospitalist service.  -Acute cholecystitis Appreciate surgery evaluation Elective cholecystectomy at later date Continue IV Rocephin and Flagyl antibiotic Follow-up WBC count Checked MRCP abdomen for assessment of choledocholithiasis IV fluids Clear liquid diet  -Choledocholithiaisis MR abdomen reviewed GI planning ercp on Thursday NPO after midnight  -Ecoli UTI Discontinue zosyn abx Start  ceftriaxone IV  -Klebsiella bacteremia and Enterobacteriaceae bacteremia Discontinue IV zosyn abx Start IV ceftriaxone  -Hypotension Blood pressure borderline Hold blood pressure meds  -Abnormal LFT secondary to cholecystitis Monitor LFTs and GI follow-up Improving  -Acute hypokalemia Replace potassium  -Coronary disease Hold aspirin and Plavix for now Patient may need surgery if no resolution  -DVT prophylaxis With subcu heparin  All the records are reviewed and case discussed with Care Management/Social Worker. Management plans discussed with the patient, family and they are in agreement.  CODE STATUS: DNR  DVT Prophylaxis: SCDs  TOTAL TIME TAKING CARE OF THIS PATIENT: 37 minutes.   POSSIBLE D/C IN 2 to 3 DAYS, DEPENDING ON CLINICAL CONDITION.  Ihor Austin M.D on 08/07/2019 at 9:56 AM  Between 7am to 6pm - Pager - [phone]  After 6pm go to [email or web address] - password [email or web address] [facility]  [facility]  Office  [phone]  CC: Primary care physician; Dale Wentworth, MD  Note: This dictation was prepared with Dragon dictation along with smaller phrase technology. Any transcriptional errors that result from this process are unintentional.

## 2019-08-07 NOTE — Progress Notes (Signed)
Tanya Cole , MD [address], [address], [place], [place], [address] [address], [place], [place], [address] Phone: [phone]  Fax: [phone]   Rollen SoxVirginia Mae Merceda Cole is being followed for choledocholithiasis   Subjective: Feels well no complaints   Objective: Vital signs in last 24 hours: Vitals:   08/06/19 1132 08/06/19 2005 08/07/19 0404 08/07/19 0523  BP: (!) 135/54 (!) 164/70 (!) 141/77   Pulse: 64 70 82   Resp:  18 19   Temp: 98.4 F (36.9 C) 98.2 F (36.8 C) 98.1 F (36.7 C)   TempSrc: Oral Oral Oral   SpO2: 98% 96% 97%   Weight:    56.2 kg  Height:       Weight change: 2.8 kg  Intake/Output Summary (Last 24 hours) at 08/07/2019 0859 Last data filed at 08/07/2019 0454 Gross per 24 hour  Intake 1924.75 ml  Output 700 ml  Net 1224.75 ml     Exam: Heart:: Regular rate and rhythm, S1S2 present or without murmur or extra heart sounds Lungs: normal, clear to auscultation and clear to auscultation and percussion Abdomen: soft, nontender, normal bowel sounds   Lab Results: @LABTEST2 @ Micro Results: Recent Results (from the past 240 hour(s))  Blood culture (routine x 2)     Status: Abnormal (Preliminary result)   Collection Time: 08/04/19 10:32 PM   Specimen: BLOOD LEFT FOREARM  Result Value Ref Range Status   Specimen Description BLOOD LEFT FOREARM  Final   Special Requests   Final    BOTTLES DRAWN AEROBIC AND ANAEROBIC Blood Culture results may not be optimal due to an excessive volume of blood received in culture bottles   Culture  Setup Time   Final    GRAM NEGATIVE RODS AEROBIC BOTTLE ONLY CRITICAL VALUE NOTED.  VALUE IS CONSISTENT WITH PREVIOUSLY REPORTED AND CALLED VALUE.    Culture (A)  Final    KLEBSIELLA PNEUMONIAE SUSCEPTIBILITIES TO FOLLOW    Report Status PENDING  Incomplete   Organism ID, Bacteria KLEBSIELLA PNEUMONIAE  Final      Susceptibility   Klebsiella pneumoniae - MIC*    AMPICILLIN >=32 RESISTANT Resistant      CEFAZOLIN <=4 SENSITIVE Sensitive     CEFEPIME <=1 SENSITIVE Sensitive     CEFTAZIDIME <=1 SENSITIVE Sensitive     CEFTRIAXONE <=1 SENSITIVE Sensitive     CIPROFLOXACIN <=0.25 SENSITIVE Sensitive     GENTAMICIN <=1 SENSITIVE Sensitive     IMIPENEM <=0.25 SENSITIVE Sensitive     TRIMETH/SULFA <=20 SENSITIVE Sensitive     AMPICILLIN/SULBACTAM 8 SENSITIVE Sensitive     PIP/TAZO <=4 SENSITIVE Sensitive     Extended ESBL Value in next row Sensitive      NEGATIVEPerformed at [facility], [address] [place], [place] [address]    * KLEBSIELLA PNEUMONIAE  Blood culture (routine x 2)     Status: Abnormal (Preliminary result)   Collection Time: 08/04/19 10:32 PM   Specimen: BLOOD  Result Value Ref Range Status   Specimen Description   Final    BLOOD RIGHT FOREARM Performed at [facility], [address] [place], [place] [address]    Special Requests   Final    BOTTLES DRAWN AEROBIC AND ANAEROBIC Blood Culture adequate volume Performed at [facility], [address] [place], [place] [address]    Culture  Setup Time   Final    Organism ID to follow GRAM NEGATIVE RODS AEROBIC BOTTLE ONLY CRITICAL RESULT CALLED TO,  READ BACK BY AND VERIFIED WITH: CHRISTINE KATSOUDAS AT 1309 ON 08/05/2019 [facility]. Performed at [facility], [address], [place], [place] [address]    Culture (A)  Final    KLEBSIELLA PNEUMONIAE SUSCEPTIBILITIES TO FOLLOW Performed at [facility], [address], [place], [place] [address]    Report Status PENDING  Incomplete  SARS Coronavirus 2 [facility] order, Performed in [facility] hospital lab) Nasopharyngeal     Status: None   Collection Time: 08/04/19 10:32 PM   Specimen: Nasopharyngeal  Result Value Ref Range Status   SARS Coronavirus 2 NEGATIVE NEGATIVE Final    Comment: (NOTE) If result is NEGATIVE SARS-CoV-2 target nucleic acids are NOT DETECTED. The SARS-CoV-2 RNA is generally detectable in upper and lower   respiratory specimens during the acute phase of infection. The lowest  concentration of SARS-CoV-2 viral copies this assay can detect is 250  copies / mL. A negative result does not preclude SARS-CoV-2 infection  and should not be used as the sole basis for treatment or other  patient management decisions.  A negative result may occur with  improper specimen collection / handling, submission of specimen other  than nasopharyngeal swab, presence of viral mutation(s) within the  areas targeted by this assay, and inadequate number of viral copies  (<250 copies / mL). A negative result must be combined with clinical  observations, patient history, and epidemiological information. If result is POSITIVE SARS-CoV-2 target nucleic acids are DETECTED. The SARS-CoV-2 RNA is generally detectable in upper and lower  respiratory specimens dur ing the acute phase of infection.  Positive  results are indicative of active infection with SARS-CoV-2.  Clinical  correlation with patient history and other diagnostic information is  necessary to determine patient infection status.  Positive results do  not rule out bacterial infection or co-infection with other viruses. If result is PRESUMPTIVE POSTIVE SARS-CoV-2 nucleic acids MAY BE PRESENT.   A presumptive positive result was obtained on the submitted specimen  and confirmed on repeat testing.  While 2019 novel coronavirus  (SARS-CoV-2) nucleic acids may be present in the submitted sample  additional confirmatory testing may be necessary for epidemiological  and / or clinical management purposes  to differentiate between  SARS-CoV-2 and other Sarbecovirus currently known to infect humans.  If clinically indicated additional testing with an alternate test  methodology [phone]) is advised. The SARS-CoV-2 RNA is generally  detectable in upper and lower respiratory sp ecimens during the acute  phase of infection. The expected result is Negative. Fact  Sheet for Patients:  BoilerBrush.com.cy Fact Sheet for Healthcare Providers: https://pope.com/ This test is not yet approved or cleared by the [place] FDA and has been authorized for detection and/or diagnosis of SARS-CoV-2 by FDA under an Emergency Use Authorization (EUA).  This EUA will remain in effect (meaning this test can be used) for the duration of the COVID-19 declaration under Section 564(b)(1) of the Act, 21 U.S.C. section 360bbb-3(b)(1), unless the authorization is terminated or revoked sooner. Performed at [facility], [address], [place], [place] [address]   Urine Culture     Status: Abnormal   Collection Time: 08/04/19 10:32 PM   Specimen: Urine, Random  Result Value Ref Range Status   Specimen Description   Final    URINE, RANDOM Performed at [facility], [address] [place], [place] [address]    Special Requests   Final    NONE Performed at [facility], [address] [place], [place] [address]    Culture >=100,000 COLONIES/mL ESCHERICHIA COLI (A)  Final   Report Status 08/06/2019 FINAL  Final   Organism ID, Bacteria ESCHERICHIA COLI (A)  Final      Susceptibility   Escherichia coli - MIC*    AMPICILLIN >=32 RESISTANT Resistant     CEFAZOLIN <=4 SENSITIVE Sensitive     CEFTRIAXONE <=1 SENSITIVE Sensitive     CIPROFLOXACIN 1 SENSITIVE Sensitive     GENTAMICIN <=1 SENSITIVE Sensitive     IMIPENEM <=0.25 SENSITIVE Sensitive     NITROFURANTOIN <=16 SENSITIVE Sensitive     TRIMETH/SULFA <=20 SENSITIVE Sensitive     AMPICILLIN/SULBACTAM 16 INTERMEDIATE Intermediate     PIP/TAZO <=4 SENSITIVE Sensitive     Extended ESBL NEGATIVE Sensitive     * >=100,000 COLONIES/mL ESCHERICHIA COLI  Blood Culture ID Panel (Reflexed)     Status: Abnormal   Collection Time: 08/04/19 10:32 PM  Result Value Ref Range Status   Enterococcus species NOT DETECTED NOT DETECTED Final    Listeria monocytogenes NOT DETECTED NOT DETECTED Final   Staphylococcus species NOT DETECTED NOT DETECTED Final   Staphylococcus aureus (BCID) NOT DETECTED NOT DETECTED Final   Streptococcus species NOT DETECTED NOT DETECTED Final   Streptococcus agalactiae NOT DETECTED NOT DETECTED Final   Streptococcus pneumoniae NOT DETECTED NOT DETECTED Final   Streptococcus pyogenes NOT DETECTED NOT DETECTED Final   Acinetobacter baumannii NOT DETECTED NOT DETECTED Final   Enterobacteriaceae species DETECTED (A) NOT DETECTED Final    Comment: Enterobacteriaceae represent a large family of gram-negative bacteria, not a single organism. CRITICAL RESULT CALLED TO, READ BACK BY AND VERIFIED WITH: CHRISTINE KATSOUDAS AT 1309 ON 08/05/2019 [facility].    Enterobacter cloacae complex NOT DETECTED NOT DETECTED Final   Escherichia coli NOT DETECTED NOT DETECTED Final   Klebsiella oxytoca NOT DETECTED NOT DETECTED Final   Klebsiella pneumoniae DETECTED (A) NOT DETECTED Final    Comment: CRITICAL RESULT CALLED TO, READ BACK BY AND VERIFIED WITH: CHRISTINE KATSOUDAS AT 1309 ON 08/05/2019 [facility].    Proteus species NOT DETECTED NOT DETECTED Final   Serratia marcescens NOT DETECTED NOT DETECTED Final   Carbapenem resistance NOT DETECTED NOT DETECTED Final   Haemophilus influenzae NOT DETECTED NOT DETECTED Final   Neisseria meningitidis NOT DETECTED NOT DETECTED Final   Pseudomonas aeruginosa NOT DETECTED NOT DETECTED Final   Candida albicans NOT DETECTED NOT DETECTED Final   Candida glabrata NOT DETECTED NOT DETECTED Final   Candida krusei NOT DETECTED NOT DETECTED Final   Candida parapsilosis NOT DETECTED NOT DETECTED Final   Candida tropicalis NOT DETECTED NOT DETECTED Final    Comment: Performed at [facility], [address] [place], [place] [address]   Studies/Results: Mr 3d Recon At Scanner  Result Date: 08/05/2019 CLINICAL DATA:  Right upper quadrant pain.  Cholecystitis suspected. EXAM: MRI ABDOMEN  WITHOUT AND WITH CONTRAST (INCLUDING MRCP) TECHNIQUE: Multiplanar multisequence MR imaging of the abdomen was performed both before and after the administration of intravenous contrast. Heavily T2-weighted images of the biliary and pancreatic ducts were obtained, and three-dimensional MRCP images were rendered by post processing. CONTRAST:  5mL GADAVIST GADOBUTROL 1 MMOL/ML IV SOLN COMPARISON:  Ultrasound exam 08/04/2019.  Abdominal CT 10/06/2016. FINDINGS: Lower chest: Unremarkable. Hepatobiliary: No suspicious focal abnormality within the liver parenchyma. Gallbladder is distended. Multiple layering tiny gallstones evident measuring up to 4-5 mm on a background of layering gallbladder sludge. Common duct is nondilated. Common bile duct in the head of  the pancreas measures 5 mm, within normal limits. 3 mm stone is identified in the distal common bile duct (well seen on coronal MRCP image 25/15 and axial T2 haste image 24/4). There may possibly be another stone directly adjacent to the ampulla. Pancreas: Multiple tiny cystic lesions are noted in the tail of pancreas measuring 3-4 mm and 1 of these appears to communicate with the main duct on axial T2 haste 19/4. Spleen: No splenomegaly. Small cyst or infarct noted in the subcapsular parenchyma. Adrenals/Urinary Tract: No adrenal nodule or mass. Tiny cortical lesions in both kidneys are too small to characterize but likely represent tiny cysts. No hydronephrosis. Stomach/Bowel: Stomach is unremarkable. No gastric wall thickening. No evidence of outlet obstruction. Duodenum is normally positioned as is the ligament of Treitz. No small bowel or colonic dilatation within the visualized abdomen. Advanced diverticular disease noted in the abdominal segments of the colon. Vascular/Lymphatic: No abdominal aortic aneurysm. Portal vein, superior mesenteric vein, and splenic vein are patent. No abdominal lymphadenopathy. Other:  No intraperitoneal free fluid. Musculoskeletal:  No abnormal marrow enhancement within the visualized bony anatomy. IMPRESSION: 1. Distended gallbladder with layering tiny stones and sludge. No substantial gallbladder wall thickening or pericholecystic fluid by MRI. Common bile duct measures up to 5 mm with 3 mm distal common bile duct stone clearly visualized. There may be a second tiny stone immediately adjacent to the ampulla. 2. Tiny cystic foci in the tail of the pancreas, 1 of which may communicate with the main pancreatic duct. For cystic lesions of this size in a patient of this age, consensus criteria recommend repeat imaging in 2 years. This recommendation follows ACR consensus guidelines: Management of Incidental Pancreatic Cysts: A White Paper of the ACR Incidental Findings Committee. J Am Coll Radiol 7353;29:924-268. 3. Colonic diverticulosis. Electronically Signed   By: Misty Stanley M.D.   On: 08/05/2019 12:28   Mr Abdomen Mrcp Moise Boring Contast  Result Date: 08/05/2019 CLINICAL DATA:  Right upper quadrant pain.  Cholecystitis suspected. EXAM: MRI ABDOMEN WITHOUT AND WITH CONTRAST (INCLUDING MRCP) TECHNIQUE: Multiplanar multisequence MR imaging of the abdomen was performed both before and after the administration of intravenous contrast. Heavily T2-weighted images of the biliary and pancreatic ducts were obtained, and three-dimensional MRCP images were rendered by post processing. CONTRAST:  66mL GADAVIST GADOBUTROL 1 MMOL/ML IV SOLN COMPARISON:  Ultrasound exam 08/04/2019.  Abdominal CT 10/06/2016. FINDINGS: Lower chest: Unremarkable. Hepatobiliary: No suspicious focal abnormality within the liver parenchyma. Gallbladder is distended. Multiple layering tiny gallstones evident measuring up to 4-5 mm on a background of layering gallbladder sludge. Common duct is nondilated. Common bile duct in the head of the pancreas measures 5 mm, within normal limits. 3 mm stone is identified in the distal common bile duct (well seen on coronal MRCP image 25/15 and  axial T2 haste image 24/4). There may possibly be another stone directly adjacent to the ampulla. Pancreas: Multiple tiny cystic lesions are noted in the tail of pancreas measuring 3-4 mm and 1 of these appears to communicate with the main duct on axial T2 haste 19/4. Spleen: No splenomegaly. Small cyst or infarct noted in the subcapsular parenchyma. Adrenals/Urinary Tract: No adrenal nodule or mass. Tiny cortical lesions in both kidneys are too small to characterize but likely represent tiny cysts. No hydronephrosis. Stomach/Bowel: Stomach is unremarkable. No gastric wall thickening. No evidence of outlet obstruction. Duodenum is normally positioned as is the ligament of Treitz. No small bowel or colonic dilatation within the visualized abdomen. Advanced  diverticular disease noted in the abdominal segments of the colon. Vascular/Lymphatic: No abdominal aortic aneurysm. Portal vein, superior mesenteric vein, and splenic vein are patent. No abdominal lymphadenopathy. Other:  No intraperitoneal free fluid. Musculoskeletal: No abnormal marrow enhancement within the visualized bony anatomy. IMPRESSION: 1. Distended gallbladder with layering tiny stones and sludge. No substantial gallbladder wall thickening or pericholecystic fluid by MRI. Common bile duct measures up to 5 mm with 3 mm distal common bile duct stone clearly visualized. There may be a second tiny stone immediately adjacent to the ampulla. 2. Tiny cystic foci in the tail of the pancreas, 1 of which may communicate with the main pancreatic duct. For cystic lesions of this size in a patient of this age, consensus criteria recommend repeat imaging in 2 years. This recommendation follows ACR consensus guidelines: Management of Incidental Pancreatic Cysts: A White Paper of the ACR Incidental Findings Committee. J Am Coll Radiol 2017;14:911-923. 3. Colonic diverticulosis. Electronically Signed   By: Kennith Center M.D.   On: 08/05/2019 12:28   Medications: I  have reviewed the patient's current medications. Scheduled Meds:  docusate sodium  100 mg Oral BID   heparin  5,000 Units Subcutaneous Q8H   mometasone-formoterol  2 puff Inhalation BID   promethazine  12.5 mg Intravenous Once   Continuous Infusions:  sodium chloride Stopped (08/07/19 0454)   0.9 % NaCl with KCl 20 mEq / L 100 mL/hr at 08/07/19 0454   piperacillin-tazobactam (ZOSYN)  IV 3.375 g (08/07/19 0454)   PRN Meds:.sodium chloride, acetaminophen **OR** acetaminophen, albuterol, morphine injection, nitroGLYCERIN, ondansetron **OR** ondansetron (ZOFRAN) IV   Assessment: Active Problems:   Cholecystitis   Choledocholithiasis  Tanya Cole a 85 y.o.y/o femaleadmitted with acute abdominal pain, elevated liver function tests including a total bilirubin of 2.6, direct component 1.6. Elevated transaminases and alkaline phosphatase. Right upper quadrant ultrasound shows a common bile duct diameter of 7 mm. There are features of acute cholecystitis and sonographic evidence of positive Murphy's sign. I have been consulted for choledocholithiasis. MRCP shows cholidocholithiasis - Will need ERCP - patient on Asprin and plavix.  Seen by surgery for cholecystectomy.. Afebrile, no leukocytosis, total bilirubin trending down.  Klebsiella bacteremia  Plan 1.  ERCP tomorrow has been scheduled: Plavix has been held.      LOS: 2 days   Tanya Mood, MD 08/07/2019, 8:59 AM

## 2019-08-07 NOTE — Progress Notes (Signed)
[facility] SURGICAL PROGRESS NOTE (cpt [phone])  Hospital Day(s): 2.   Interval History: Patient seen and examined, no acute events or new complaints overnight. Patient reports that she continues to feel better this morning. She notes soreness but attributes this to hospital beds. No fever, chills, nausea, or emesis. She has had some improvement in bilirubinemia. Tolerated clear liquids yesterday without issues. No other acute complaints.   Review of Systems:  Constitutional: denies fever, chills  HEENT: denies cough or congestion  Respiratory: denies any shortness of breath  Cardiovascular: denies chest pain or palpitations  Gastrointestinal: denies abdominal pain, N/V, or diarrhea/and bowel function as per interval history Genitourinary: denies burning with urination or urinary frequency   Vital signs in last 24 hours: [min-max] current  Temp:  [98.1 F (36.7 C)-98.4 F (36.9 C)] 98.1 F (36.7 C) (10/07 0404) Pulse Rate:  [63-82] 82 (10/07 0404) Resp:  [18-20] 19 (10/07 0404) BP: (126-164)/(51-77) 141/77 (10/07 0404) SpO2:  [96 %-98 %] 97 % (10/07 0404) Weight:  [56.2 kg] 56.2 kg (10/07 0523)     Height: 5\' 1"  (154.9 cm) Weight: 56.2 kg BMI (Calculated): 23.42   Intake/Output last 2 shifts:  10/06 0701 - 10/07 0700 In: 1924.8 [P.O.:160; I.V.:1625; IV Piggyback:139.8] Out: 700 [Urine:700]   Physical Exam:  Constitutional: alert, cooperative and no distress  HENT: normocephalic without obvious abnormality Eyes: PERRL, EOM's grossly intact and symmetric, no scleral icterus   Respiratory: breathing non-labored at rest  Cardiovascular: regular rate and sinus rhythm  Gastrointestinal: soft, non-tender, and non-distended, no rebound/guarding Musculoskeletal: no edema or wounds, motor and sensation grossly intact, NT  Integumentary: Warm, dry, no juandice  Labs:  CBC Latest Ref Rng & Units 08/06/2019 08/04/2019 05/24/2019  WBC 4.0 - 10.5 K/uL 9.8 9.9 8.7   Hemoglobin 12.0 - 15.0 g/dL 10.0(L) 12.8 13.4  Hematocrit 36.0 - 46.0 % 31.1(L) 39.7 40.1  Platelets 150 - 400 K/uL 197 256 350.0   CMP Latest Ref Rng & Units 08/07/2019 08/06/2019 08/04/2019  Glucose 70 - 99 mg/dL 97 94 -  BUN 8 - 23 mg/dL 7(L) 14 -  Creatinine 10/04/2019 - 1.00 mg/dL 1.30 8.65 -  Sodium 7.84 - 145 mmol/L 142 139 -  Potassium 3.5 - 5.1 mmol/L 3.7 3.1(L) -  Chloride 98 - 111 mmol/L 115(H) 111 -  CO2 22 - 32 mmol/L 20(L) 23 -  Calcium 8.9 - 10.3 mg/dL 8.0(L) 7.1(L) -  Total Protein 6.5 - 8.1 g/dL 696) 2.9(B) 7.0  Total Bilirubin 0.3 - 1.2 mg/dL 2.8(U) 2.3(H) 2.8(H)  Alkaline Phos 38 - 126 U/L 239(H) 244(H) 317(H)  AST 15 - 41 U/L 138(H) 219(H) 433(H)  ALT 0 - 44 U/L 188(H) 233(H) 294(H)     Imaging studies: No new pertinent imaging studies   Assessment/Plan: (ICD-10's: K80.50) 85 y.o. female with improved/resolved abdominal pain but still with elevated, but improving, LFTs and hyperbilirubinemia attributable to choledocholithiasis seen on MRCP w/o evidence of cholecystitis, whic is complicated by her cardiac history including MI in 09/2018 requiring drug eluting stent and need for Plavix.    - Okay to advance diet today as tolerated              - NPO at midnight on 10/08 for ERCP              - Continue IVF             - Continue IV Abx (Zosyn) for now ; BCx growing klebsiella Pneumoniae and enterobacteriaceae             -  pain control prn; antiemetics prn             - monitor abdominal examination             - trend LFTs, Bilirubin             - Plan for ERCP with Dr Allen Norris on 10/08             - No surgical intervention this admission. We will follow her outpatient for interval cholecystectomy once we obtain cardiac clearance, which she understands              - mobilization encouraged             - further management per primary and consulting services.    All of the above findings and recommendations were discussed with the patient, and the medical team, and  all of patient's questions were answered to her expressed satisfaction.  -- Edison Simon, PA-C [facility] 08/07/2019, 8:37 AM [phone] M-F: 7am - 4pm

## 2019-08-07 NOTE — Plan of Care (Signed)
Patient doing well.  Tolerated clears well.  Plan for ERCP tomorrow.  No significant changes.

## 2019-08-08 ENCOUNTER — Inpatient Hospital Stay: Payer: Medicare HMO

## 2019-08-08 ENCOUNTER — Other Ambulatory Visit: Payer: Self-pay

## 2019-08-08 ENCOUNTER — Inpatient Hospital Stay: Payer: Medicare HMO | Admitting: Certified Registered Nurse Anesthetist

## 2019-08-08 ENCOUNTER — Encounter
Admission: EM | Disposition: A | Discharge status: Home / Self Care | Payer: Self-pay | Source: Home / Self Care | Attending: Internal Medicine

## 2019-08-08 ENCOUNTER — Encounter: Payer: Self-pay | Admitting: Certified Registered Nurse Anesthetist

## 2019-08-08 HISTORY — PX: ENDOSCOPIC RETROGRADE CHOLANGIOPANCREATOGRAPHY (ERCP) WITH PROPOFOL: SHX5810

## 2019-08-08 LAB — COMPREHENSIVE METABOLIC PANEL
ALT: 160 U/L — ABNORMAL HIGH (ref 0–44)
AST: 99 U/L — ABNORMAL HIGH (ref 15–41)
Albumin: 3.1 g/dL — ABNORMAL LOW (ref 3.5–5.0)
Alkaline Phosphatase: 268 U/L — ABNORMAL HIGH (ref 38–126)
Anion gap: 5 (ref 5–15)
BUN: <5 mg/dL — ABNORMAL LOW (ref 8–23)
CO2: 24 mmol/L (ref 22–32)
Calcium: 8.7 mg/dL — ABNORMAL LOW (ref 8.9–10.3)
Chloride: 113 mmol/L — ABNORMAL HIGH (ref 98–111)
Creatinine, Ser: 0.52 mg/dL (ref 0.44–1.00)
GFR calc Af Amer: >60 mL/min (ref >60)
GFR calc non Af Amer: >60 mL/min (ref >60)
Glucose, Bld: 111 mg/dL — ABNORMAL HIGH (ref 70–99)
Potassium: 4.3 mmol/L (ref 3.5–5.1)
Sodium: 142 mmol/L (ref 135–145)
Total Bilirubin: 1.1 mg/dL (ref 0.3–1.2)
Total Protein: 5.9 g/dL — ABNORMAL LOW (ref 6.5–8.1)

## 2019-08-08 SURGERY — ENDOSCOPIC RETROGRADE CHOLANGIOPANCREATOGRAPHY (ERCP) WITH PROPOFOL
Anesthesia: General

## 2019-08-08 MED ORDER — INDOMETHACIN 50 MG RE SUPP
RECTAL | Status: AC
  Filled 2019-08-08: qty 1

## 2019-08-08 MED ORDER — PROPOFOL 500 MG/50ML IV EMUL
INTRAVENOUS | Status: DC | PRN
  Administered 2019-08-08: 110 ug/kg/min via INTRAVENOUS

## 2019-08-08 MED ORDER — PROPOFOL 10 MG/ML IV BOLUS
INTRAVENOUS | Status: DC | PRN
  Administered 2019-08-08: 60 mg via INTRAVENOUS

## 2019-08-08 MED ORDER — LIDOCAINE HCL (CARDIAC) PF 100 MG/5ML IV SOSY
PREFILLED_SYRINGE | INTRAVENOUS | Status: DC | PRN
  Administered 2019-08-08: 50 mg via INTRAVENOUS

## 2019-08-08 MED ORDER — INDOMETHACIN 50 MG RE SUPP
RECTAL | Status: AC
  Administered 2019-08-08: 100 mg via RECTAL
  Filled 2019-08-08: qty 1

## 2019-08-08 MED ORDER — INDOMETHACIN 50 MG RE SUPP
100.0000 mg | Freq: Once | RECTAL | Status: AC
  Administered 2019-08-08: 09:00:00 100 mg via RECTAL

## 2019-08-08 MED ORDER — LIDOCAINE HCL (PF) 2 % IJ SOLN
INTRAMUSCULAR | Status: AC
  Filled 2019-08-08: qty 10

## 2019-08-08 MED ORDER — LACTATED RINGERS IV SOLN
INTRAVENOUS | Status: DC
  Administered 2019-08-08: 09:00:00 via INTRAVENOUS

## 2019-08-08 MED ORDER — PROPOFOL 500 MG/50ML IV EMUL
INTRAVENOUS | Status: AC
  Filled 2019-08-08: qty 50

## 2019-08-08 NOTE — Progress Notes (Signed)
[facility] SURGICAL PROGRESS NOTE (cpt [id number])  Hospital Day(s): 3.   Interval History: Patient seen and examined, no acute events or new complaints overnight. Patient reports she is feeling pretty good this morning. No complaints of fever, chills, nausea, emesis, or abdominal pain. She was able to tolerate CLD yesterday without issue and she is NPO this morning. Plan for ERCP this morning with GI. She is having bowel function. No other complaints.   Review of Systems:  Constitutional: denies fever, chills  HEENT: denies cough or congestion  Respiratory: denies any shortness of breath  Cardiovascular: denies chest pain or palpitations  Gastrointestinal: denies abdominal pain, N/V, or diarrhea/and bowel function as per interval history Genitourinary: denies burning with urination or urinary frequency  Vital signs in last 24 hours: [min-max] current  Temp:  [97.5 F (36.4 C)-98.4 F (36.9 C)] 98.3 F (36.8 C) (10/08 0556) Pulse Rate:  [62-85] 62 (10/08 0556) Resp:  [16-20] 20 (10/08 0556) BP: (137-175)/(73-79) 137/73 (10/08 0556) SpO2:  [98 %-100 %] 98 % (10/08 0556) Weight:  [54.9 kg] 54.9 kg (10/08 0500)     Height: 5\' 1"  (154.9 cm) Weight: 54.9 kg BMI (Calculated): 22.88   Intake/Output last 2 shifts:  10/07 0701 - 10/08 0700 In: 2493.3 [P.O.:40; I.V.:2353.3; IV Piggyback:100] Out: -    Physical Exam:  Constitutional: alert, cooperative and no distress  HENT: normocephalic without obvious abnormality  Eyes: PERRL, EOM's grossly intact and symmetric  Respiratory: breathing non-labored at rest  Cardiovascular: regular rate and sinus rhythm  Gastrointestinal: soft, non-tender, and non-distended. No rebound/guarding Musculoskeletal: UE and LE FROM, no edema or wounds, motor and sensation grossly intact, NT    Labs:  CBC Latest Ref Rng & Units 08/06/2019 08/04/2019 05/24/2019  WBC 4.0 - 10.5 K/uL 9.8 9.9 8.7  Hemoglobin 12.0 - 15.0 g/dL 10.0(L) 12.8 13.4   Hematocrit 36.0 - 46.0 % 31.1(L) 39.7 40.1  Platelets 150 - 400 K/uL 197 256 350.0   CMP Latest Ref Rng & Units 08/08/2019 08/07/2019 08/06/2019  Glucose 70 - 99 mg/dL 111(H) 97 94  BUN 8 - 23 mg/dL <5(L) 7(L) 14  Creatinine 0.44 - 1.00 mg/dL 0.52 0.47 0.68  Sodium 135 - 145 mmol/L 142 142 139  Potassium 3.5 - 5.1 mmol/L 4.3 3.7 3.1(L)  Chloride 98 - 111 mmol/L 113(H) 115(H) 111  CO2 22 - 32 mmol/L 24 20(L) 23  Calcium 8.9 - 10.3 mg/dL 8.7(L) 8.0(L) 7.1(L)  Total Protein 6.5 - 8.1 g/dL 5.9(L) 5.6(L) 5.4(L)  Total Bilirubin 0.3 - 1.2 mg/dL 1.1 1.3(H) 2.3(H)  Alkaline Phos 38 - 126 U/L 268(H) 239(H) 244(H)  AST 15 - 41 U/L 99(H) 138(H) 219(H)  ALT 0 - 44 U/L 160(H) 188(H) 233(H)     Imaging studies: No new pertinent imaging studies   Assessment/Plan: (ICD-10's: K80.50) 85 y.o. female with improved/resolved abdominal pain and resolved hyperbilirubinemia still with elevated LFTs attributable to choledocholithiasis seen on MRCP w/o evidence of cholecystitis, whic is complicated by her cardiac history including MI in 09/2018 requiring drug eluting stent and need for Plavix.  - NPO for procedure; CLD should be okay following ERCP - Continue IVF - Continue IV Abx (Zosyn) for now ; BCx growing klebsiella Pneumoniae and enterobacteriaceae - pain control prn; antiemetics prn - monitor abdominal examination - trend LFTs, Bilirubin - Plan for ERCP today with Dr Allen Norris - No surgical intervention this admission. We will follow her outpatient for interval cholecystectomy once we obtain cardiac clearance, which she understands - mobilization  encouraged - further management per primary and consulting services.   All of the above findings and recommendations were discussed with the patient, and the medical team, and all of patient's questions were answered to her expressed  satisfaction.  -- Lynden Oxford, PA-C [facility] 08/08/2019, 8:15 AM [phone] M-F: 7am - 4pm

## 2019-08-08 NOTE — Op Note (Signed)
[facility] Gastroenterology Patient Name: Tanya Cole Procedure Date: 08/08/2019 9:06 AM MRN: [id number] Account #: [id number] Date of Birth: 01-13-1933 Admit Type: Inpatient Age: 85 Room: [facility] ENDO ROOM 4 Gender: Female Note Status: Finalized Procedure:            ERCP Indications:          Common bile duct stone(s), Abnormal MRCP Providers:            Midge Minium MD, MD Referring MD:         Dale Latham, MD (Referring MD) Medicines:            Propofol per Anesthesia Complications:        No immediate complications. Procedure:            Pre-Anesthesia Assessment:                       - Prior to the procedure, a History and Physical was                        performed, and patient medications and allergies were                        reviewed. The patient's tolerance of previous                        anesthesia was also reviewed. The risks and benefits of                        the procedure and the sedation options and risks were                        discussed with the patient. All questions were                        answered, and informed consent was obtained. Prior                        Anticoagulants: The patient has taken Plavix                        (clopidogrel), last dose was 4 days prior to procedure.                        ASA Grade Assessment: II - A patient with mild systemic                        disease. After reviewing the risks and benefits, the                        patient was deemed in satisfactory condition to undergo                        the procedure.                       After obtaining informed consent, the scope was passed                        under direct vision. Throughout the procedure, the  patient's blood pressure, pulse, and oxygen saturations                        were monitored continuously. The Duodenoscope was                        introduced through the mouth, and used to inject                         contrast into and used to inject contrast into the bile                        duct. The ERCP was accomplished without difficulty. The                        patient tolerated the procedure well. Findings:      The scout film was normal. The esophagus was successfully intubated       under direct vision. The scope was advanced to a normal major papilla in       the descending duodenum without detailed examination of the pharynx,       larynx and associated structures, and upper GI tract. The upper GI tract       was grossly normal. The bile duct was deeply cannulated with the       short-nosed traction sphincterotome. Contrast was injected. I personally       interpreted the bile duct images. There was brisk flow of contrast       through the ducts. Image quality was excellent. Contrast extended to the       entire biliary tree. The lower third of the main bile duct contained two       stones. A wire was passed into the biliary tree. A 4 mm biliary       sphincterotomy was made with a traction (standard) sphincterotome using       ERBE electrocautery. There was no post-sphincterotomy bleeding. The       biliary tree was swept with a 15 mm balloon starting at the bifurcation.       Two stones were removed. No stones remained. There were multiple stones       in the gall bladder seen. Impression:           - Choledocholithiasis was found. Complete removal was                        accomplished by biliary sphincterotomy and balloon                        extraction.                       - A biliary sphincterotomy was performed.                       - The biliary tree was swept. Recommendation:       - Return patient to hospital ward for ongoing care.                       - Clear liquid diet today.                       -  Continue present medications. Procedure Code(s):    --- Professional ---                       [phone], Endoscopic retrograde  cholangiopancreatography                        (ERCP); with removal of calculi/debris from                        biliary/pancreatic duct(s)                       43262, Endoscopic retrograde cholangiopancreatography                        (ERCP); with sphincterotomy/papillotomy                       [phone], Endoscopic catheterization of the biliary ductal                        system, radiological supervision and interpretation Diagnosis Code(s):    --- Professional ---                       R93.2, Abnormal findings on diagnostic imaging of liver                        and biliary tract                       K80.50, Calculus of bile duct without cholangitis or                        cholecystitis without obstruction CPT copyright 2019 American Medical Association. All rights reserved. The codes documented in this report are preliminary and upon coder review may  be revised to meet current compliance requirements. Midge Minium MD, MD 08/08/2019 9:58:33 AM This report has been signed electronically. Number of Addenda: 0 Note Initiated On: 08/08/2019 9:06 AM Estimated Blood Loss: Estimated blood loss: none.      [place]

## 2019-08-08 NOTE — Care Management Important Message (Signed)
Important Message  Patient Details  Name: Tanya Cole MRN: [id number] Date of Birth: [birth year]   Medicare Important Message Given:  Yes     Dannette Barbara 08/08/2019, 1:33 PM

## 2019-08-08 NOTE — Anesthesia Preprocedure Evaluation (Signed)
Anesthesia Evaluation  Patient identified by MRN, date of birth, ID band Patient awake    Reviewed: Allergy & Precautions, H&P , NPO status , Patient's Chart, lab work & pertinent test results, reviewed documented beta blocker date and time   Airway Mallampati: II   Neck ROM: full    Dental  (+) Upper Dentures, Lower Dentures   Pulmonary asthma ,    Pulmonary exam normal        Cardiovascular Exercise Tolerance: Poor hypertension, On Medications + CAD and + Past MI  Normal cardiovascular exam Rhythm:regular Rate:Normal     Neuro/Psych negative neurological ROS  negative psych ROS   GI/Hepatic negative GI ROS, Neg liver ROS,   Endo/Other  negative endocrine ROSdiabetes, Well Controlled, Type 2, Oral Hypoglycemic Agents  Renal/GU negative Renal ROS  negative genitourinary   Musculoskeletal   Abdominal   Peds  Hematology negative hematology ROS (+)   Anesthesia Other Findings Past Medical History: No date: Aortic insufficiency     Comment:  a. TTE 12/19: EF 55-60%, probable HK of the mid apical               anterior septal myocardium, Gr1DD, mild AI, mildly               dilated LA No date: Asthma No date: CAD (coronary artery disease)     Comment:  a. NSTEMI 12/19; b. LHC 10/08/18: LM minimal luminal               irregs, mLAD-1 95% s/p PCI/DES, mLAD-2 60%, LCx mild               diffuse disease throughout, RCA minimal luminal irregs No date: Diabetes mellitus (HCC) No date: Hypercholesterolemia No date: Hypertension No date: Osteopenia No date: Polymyalgia rheumatica syndrome (HCC) No date: Reactive airway disease Past Surgical History: 1981: ABDOMINAL HYSTERECTOMY     Comment:  prolapse and bleeding, ovaries not removed No date: BREAST EXCISIONAL BIOPSY; Right 10/08/2018: CORONARY STENT INTERVENTION; N/A     Comment:  Procedure: CORONARY STENT INTERVENTION;  Surgeon: Wellington Hampshire, MD;   Location: [facility];                Service: Cardiovascular;  Laterality: N/A; 10/08/2018: LEFT HEART CATH AND CORONARY ANGIOGRAPHY; N/A     Comment:  Procedure: LEFT HEART CATH AND CORONARY ANGIOGRAPHY;                Surgeon: Wellington Hampshire, MD;  Location: [facility];  Service: Cardiovascular;  Laterality: N/A; 4/09: UMBILICAL HERNIA REPAIR BMI    Body Mass Index: 22.87 kg/m     Reproductive/Obstetrics negative OB ROS                             Anesthesia Physical Anesthesia Plan  ASA: III  Anesthesia Plan: General   Post-op Pain Management:    Induction:   PONV Risk Score and Plan:   Airway Management Planned:   Additional Equipment:   Intra-op Plan:   Post-operative Plan:   Informed Consent: I have reviewed the patients History and Physical, chart, labs and discussed the procedure including the risks, benefits and alternatives for the proposed anesthesia with the patient or authorized representative who has indicated his/her understanding and  acceptance.     Dental Advisory Given  Plan Discussed with: CRNA  Anesthesia Plan Comments:         Anesthesia Quick Evaluation

## 2019-08-08 NOTE — Anesthesia Post-op Follow-up Note (Signed)
Anesthesia QCDR form completed.        

## 2019-08-08 NOTE — Transfer of Care (Signed)
Immediate Anesthesia Transfer of Care Note  Patient: Elrod  Procedure(s) Performed: ENDOSCOPIC RETROGRADE CHOLANGIOPANCREATOGRAPHY (ERCP) WITH PROPOFOL (N/A )  Patient Location: PACU and Endoscopy Unit  Anesthesia Type:General  Level of Consciousness: drowsy  Airway & Oxygen Therapy: Patient Spontanous Breathing  Post-op Assessment: Report given to RN and Post -op Vital signs reviewed and stable  Post vital signs: Reviewed and stable  Last Vitals:  Vitals Value Taken Time  BP 132/52 08/08/19 1002  Temp 36.7 C 08/08/19 1002  Pulse 65 08/08/19 1003  Resp 19 08/08/19 1003  SpO2 96 % 08/08/19 1003  Vitals shown include unvalidated device data.  Last Pain:  Vitals:   08/08/19 1002  TempSrc: Tympanic  PainSc:       Patients Stated Pain Goal: 0 (05/69/79 4801)  Complications: No apparent anesthesia complications

## 2019-08-08 NOTE — Progress Notes (Signed)
[place] [facility] [place] NAME: Tanya Cole    MR#:  [id number]  DATE OF BIRTH:  November 06, 1932  SUBJECTIVE:  CHIEF COMPLAINT:   Chief Complaint  Patient presents with  . Nausea  . Abdominal Pain  Patient seen and evaluated today Has no abdominal pain Has no nausea No fever On liquids diet Patient underwent ERCP today  REVIEW OF SYSTEMS:    ROS  CONSTITUTIONAL: No documented fever. Has fatigue, weakness. No weight gain, no weight loss.  EYES: No blurry or double vision.  ENT: No tinnitus. No postnasal drip. No redness of the oropharynx.  RESPIRATORY: No cough, no wheeze, no hemoptysis. No dyspnea.  CARDIOVASCULAR: No chest pain. No orthopnea. No palpitations. No syncope.  GASTROINTESTINAL: Has nausea, no vomiting or diarrhea. Has decreased abdominal pain. No melena or hematochezia.  GENITOURINARY: No dysuria or hematuria.  ENDOCRINE: No polyuria or nocturia. No heat or cold intolerance.  HEMATOLOGY: No anemia. No bruising. No bleeding.  INTEGUMENTARY: No rashes. No lesions.  MUSCULOSKELETAL: No arthritis. No swelling. No gout.  NEUROLOGIC: No numbness, tingling, or ataxia. No seizure-type activity.  PSYCHIATRIC: No anxiety. No insomnia. No ADD.   DRUG ALLERGIES:   Allergies  Allergen Reactions  . Tramadol Itching and Nausea And Vomiting    VITALS:  Blood pressure (!) 184/74, pulse 66, temperature 97.9 F (36.6 C), temperature source Oral, resp. rate 16, height 5\' 1"  (1.549 m), weight 54.9 kg, SpO2 99 %.  PHYSICAL EXAMINATION:   Physical Exam  GENERAL:  85 y.o.-year-old patient lying in the bed with no acute distress.  EYES: Pupils equal, round, reactive to light and accommodation. No scleral icterus. Extraocular muscles intact.  HEENT: Head atraumatic, normocephalic. Oropharynx and nasopharynx clear.  NECK:  Supple, no jugular venous distention. No thyroid enlargement, no tenderness.  LUNGS: Normal breath sounds bilaterally,  no wheezing, rales, rhonchi. No use of accessory muscles of respiration.  CARDIOVASCULAR: S1, S2 normal. No murmurs, rubs, or gallops.  ABDOMEN: Soft, tenderness decreased right upper quadrant, nondistended. Bowel sounds decreased. No organomegaly or mass.  EXTREMITIES: No cyanosis, clubbing or edema b/l.    NEUROLOGIC: Cranial nerves II through XII are intact. No focal Motor or sensory deficits b/l.   PSYCHIATRIC: The patient is alert and oriented x 3.  SKIN: No obvious rash, lesion, or ulcer.   LABORATORY PANEL:   CBC Recent Labs  Lab 08/06/19 0518  WBC 9.8  HGB 10.0*  HCT 31.1*  PLT 197   ------------------------------------------------------------------------------------------------------------------ Chemistries  Recent Labs  Lab 08/08/19 0531  NA 142  K 4.3  CL 113*  CO2 24  GLUCOSE 111*  BUN <5*  CREATININE 0.52  CALCIUM 8.7*  AST 99*  ALT 160*  ALKPHOS 268*  BILITOT 1.1   ------------------------------------------------------------------------------------------------------------------  Cardiac Enzymes No results for input(s): TROPONINI in the last 168 hours. ------------------------------------------------------------------------------------------------------------------  RADIOLOGY:  Dg C-arm 1-60 Min-no Report  Result Date: 08/08/2019 Fluoroscopy was utilized by the requesting physician.  No radiographic interpretation.     ASSESSMENT AND PLAN:  85 year old female patient with history of coronary disease, type 2 diabetes mellitus, hypertension, bronchial asthma, polymyalgia rheumatica currently under hospitalist service.  -Acute cholecystitis Appreciate surgery evaluation Elective cholecystectomy at later date Continue IV Rocephin and Flagyl antibiotic Follow-up WBC count Checked MRCP abdomen for assessment of choledocholithiasis IV fluids Clear liquid diet  -Choledocholithiaisis MR abdomen reviewed ERCP done by gastroenterology Biliary  sphincterotomy done patient tolerated procedure well Clear liquid diet started Monitor for abdominal pain  -  Ecoli UTI Discontinue zosyn abx Started ceftriaxone IV  -Klebsiella bacteremia and Enterobacteriaceae bacteremia Discontinue IV zosyn abx Started IV ceftriaxone  -Hypotension Blood pressure borderline Hold blood pressure meds  -Abnormal LFT secondary to cholecystitis Monitor LFTs and GI follow-up Improving  -Acute hypokalemia Replace potassium  -Coronary disease Hold aspirin and Plavix for now Patient may need surgery if no resolution  -DVT prophylaxis With subcu heparin  All the records are reviewed and case discussed with Care Management/Social Worker. Management plans discussed with the patient, family and they are in agreement.  CODE STATUS: DNR  DVT Prophylaxis: SCDs  TOTAL TIME TAKING CARE OF THIS PATIENT: 37 minutes.   POSSIBLE D/C IN 2 to 3 DAYS, DEPENDING ON CLINICAL CONDITION.  Ihor Austin M.D on 08/08/2019 at 1:26 PM  Between 7am to 6pm - Pager - [phone]  After 6pm go to [email or web address] - password [email or web address] [facility]  [facility] [facility]  Office  [phone]  CC: Primary care physician; Dale Desloge, MD  Note: This dictation was prepared with Dragon dictation along with smaller phrase technology. Any transcriptional errors that result from this process are unintentional.

## 2019-08-09 ENCOUNTER — Encounter: Payer: Self-pay | Admitting: Gastroenterology

## 2019-08-09 LAB — COMPREHENSIVE METABOLIC PANEL
ALT: 153 U/L — ABNORMAL HIGH (ref 0–44)
AST: 120 U/L — ABNORMAL HIGH (ref 15–41)
Albumin: 3.2 g/dL — ABNORMAL LOW (ref 3.5–5.0)
Alkaline Phosphatase: 336 U/L — ABNORMAL HIGH (ref 38–126)
Anion gap: 7 (ref 5–15)
BUN: <5 mg/dL — ABNORMAL LOW (ref 8–23)
CO2: 22 mmol/L (ref 22–32)
Calcium: 9 mg/dL (ref 8.9–10.3)
Chloride: 112 mmol/L — ABNORMAL HIGH (ref 98–111)
Creatinine, Ser: 0.55 mg/dL (ref 0.44–1.00)
GFR calc Af Amer: >60 mL/min (ref >60)
GFR calc non Af Amer: >60 mL/min (ref >60)
Glucose, Bld: 118 mg/dL — ABNORMAL HIGH (ref 70–99)
Potassium: 4.3 mmol/L (ref 3.5–5.1)
Sodium: 141 mmol/L (ref 135–145)
Total Bilirubin: 1.3 mg/dL — ABNORMAL HIGH (ref 0.3–1.2)
Total Protein: 6 g/dL — ABNORMAL LOW (ref 6.5–8.1)

## 2019-08-09 MED ORDER — CEPHALEXIN 500 MG PO CAPS: 500.0000 mg | ORAL_CAPSULE | Freq: Three times a day (TID) | ORAL | 0 refills | Status: AC

## 2019-08-09 MED ORDER — CEPHALEXIN 500 MG PO CAPS: 500.0000 mg | ORAL_CAPSULE | Freq: Three times a day (TID) | ORAL | 0 refills | Status: DC

## 2019-08-09 NOTE — Discharge Summary (Signed)
[employer] - [place] at [facility]   PATIENT NAME: [name]    MR#:  [id number]  DATE OF BIRTH:  [birth year]  DATE OF ADMISSION:  08/04/2019 ADMITTING PHYSICIAN: Arnaldo Natal, MD  DATE OF DISCHARGE: 08/09/2019  PRIMARY CARE PHYSICIAN: Dale [place], MD   ADMISSION DIAGNOSIS:  Choledocholithiasis [K80.50] Acute pancreatitis, unspecified complication status, unspecified pancreatitis type [K85.90]  DISCHARGE DIAGNOSIS:  Active Problems:   Cholecystitis   Choledocholithiasis Abdominal pain Klebsiella bacteremia E. coli UTI Enterobacteriaceae bacteremia SECONDARY DIAGNOSIS:   Past Medical History:  Diagnosis Date  . Aortic insufficiency    a. TTE 12/19: EF 55-60%, probable HK of the mid apical anterior septal myocardium, Gr1DD, mild AI, mildly dilated LA  . Asthma   . CAD (coronary artery disease)    a. NSTEMI 12/19; b. LHC 10/08/18: LM minimal luminal irregs, mLAD-1 95% s/p PCI/DES, mLAD-2 60%, LCx mild diffuse disease throughout, RCA minimal luminal irregs  . Diabetes mellitus (HCC)   . Hypercholesterolemia   . Hypertension   . Osteopenia   . Polymyalgia rheumatica syndrome (HCC)   . Reactive airway disease      ADMITTING HISTORY The patient with past medical history of CAD, diabetes, hypertension, asthma and polymyalgia rheumatica presents to the emergency department complaining of abdominal pain.  The patient reports that her pain began 3 days ago but seemed to ease off.  She had one episode of nonbloody nonbilious emesis at the onset of pain but has not had vomiting since that time.  However, she does admit to some nausea.  The pain radiates across her upper abdomen and epigastric region.  Ultrasound of her right upper quadrant revealed thickened gallbladder consistent with cholecystitis.  Common bile duct was also dilated.  Laboratory evaluation significant for elevated liver enzymes.  The patient received external metronidazole prior to the  emergency department staff calling the hospitalist service for admission.  HOSPITAL COURSE:  Patient was worked up with MRCP abdomen.  Patient was started on IV antibiotics for cholecystitis and surgery consult was done.  They did not recommend any acute intervention.  Gastroenterology consultation was also done during hospitalization.  MRCP abdomen showed choledocholithiasis.  Patient underwent ERCP by gastroenterology and biliary sphincterotomy was done.  Her aspirin and Plavix were held during the hospitalization.Initially patient was on Rocephin and Flagyl antibiotics.  Patient's blood culture grew Enterobacteriaceae and Klebsiella.  Urine culture grew E. Coli.  Antibiotics were tailored based on culture and sensitivities.  Liver function tests were elevated at the time of admission but improved.  CONSULTS OBTAINED:  Surgery consult Gastroenterology consult  DRUG ALLERGIES:   Allergies  Allergen Reactions  . Tramadol Itching and Nausea And Vomiting    DISCHARGE MEDICATIONS:   Allergies as of 08/09/2019      Reactions   Tramadol Itching, Nausea And Vomiting      Medication List    STOP taking these medications   isosorbide mononitrate 30 MG 24 hr tablet Commonly known as: IMDUR     TAKE these medications   Advair Diskus 250-50 MCG/DOSE Aepb Generic drug: Fluticasone-Salmeterol INHALE ONE PUFF BY MOUTH EVERY 12 HOURS. RINSE MOUTH AFTER EACH USE What changed: See the new instructions.   alendronate 70 MG tablet Commonly known as: FOSAMAX Take 70 mg by mouth once a week.   aspirin 81 MG tablet Take 81 mg by mouth daily.   atorvastatin 40 MG tablet Commonly known as: LIPITOR Take 1 tablet (40 mg total) by mouth daily at 6 PM.  cephALEXin 500 MG capsule Commonly known as: KEFLEX Take 1 capsule (500 mg total) by mouth 3 (three) times daily for 10 days.   clopidogrel 75 MG tablet Commonly known as: PLAVIX Take 1 tablet (75 mg total) by mouth daily with breakfast.    cyanocobalamin 1000 MCG/ML injection Commonly known as: (VITAMIN B-12) Inject 1,000 mcg into the muscle every 30 (thirty) days.   fluticasone 50 MCG/ACT nasal spray Commonly known as: FLONASE USE 2 SPRAY(S) IN EACH NOSTRIL ONCE DAILY What changed: See the new instructions.   lisinopril 20 MG tablet Commonly known as: ZESTRIL Take 1 tablet (20 mg total) by mouth daily.   metFORMIN 500 MG tablet Commonly known as: GLUCOPHAGE TAKE 1 TABLET BY MOUTH TWICE DAILY WITH A MEAL What changed: See the new instructions.   metoprolol tartrate 25 MG tablet Commonly known as: LOPRESSOR Take 1 tablet (25 mg total) by mouth 2 (two) times daily. What changed: how much to take   nitroGLYCERIN 0.4 MG SL tablet Commonly known as: NITROSTAT Place 1 tablet (0.4 mg total) under the tongue every 5 (five) minutes as needed for chest pain.   ProAir HFA 108 (90 Base) MCG/ACT inhaler Generic drug: albuterol Inhale 2 puffs into the lungs every 6 (six) hours as needed. What changed: reasons to take this   VISION FORMULA/LUTEIN PO Take by mouth daily.   Vitamin D (Ergocalciferol) 1.25 MG (50000 UT) Caps capsule Commonly known as: DRISDOL Take 50,000 Units by mouth once a week.       Today  Patient seen today Tolerated procedure well Abdominal pain resolved Tolerating diet well Hemodynamically stable VITAL SIGNS:  Blood pressure (!) 154/71, pulse 68, temperature 98.9 F (37.2 C), temperature source Oral, resp. rate 20, height 5\' 1"  (1.549 m), weight 56.9 kg, SpO2 98 %.  I/O:    Intake/Output Summary (Last 24 hours) at 08/09/2019 1153 Last data filed at 08/09/2019 0900 Gross per 24 hour  Intake 2652.78 ml  Output 400 ml  Net 2252.78 ml    PHYSICAL EXAMINATION:  Physical Exam  GENERAL:  85 y.o.-year-old patient lying in the bed with no acute distress.  LUNGS: Normal breath sounds bilaterally, no wheezing, rales,rhonchi or crepitation. No use of accessory muscles of respiration.   CARDIOVASCULAR: S1, S2 normal. No murmurs, rubs, or gallops.  ABDOMEN: Soft, non-tender, non-distended. Bowel sounds present. No organomegaly or mass.  NEUROLOGIC: Moves all 4 extremities. PSYCHIATRIC: The patient is alert and oriented x 3.  SKIN: No obvious rash, lesion, or ulcer.   DATA REVIEW:   CBC Recent Labs  Lab 08/06/19 0518  WBC 9.8  HGB 10.0*  HCT 31.1*  PLT 197    Chemistries  Recent Labs  Lab 08/09/19 0452  NA 141  K 4.3  CL 112*  CO2 22  GLUCOSE 118*  BUN <5*  CREATININE 0.55  CALCIUM 9.0  AST 120*  ALT 153*  ALKPHOS 336*  BILITOT 1.3*    Cardiac Enzymes No results for input(s): TROPONINI in the last 168 hours.  Microbiology Results  Results for orders placed or performed during the hospital encounter of 08/04/19  Blood culture (routine x 2)     Status: Abnormal (Preliminary result)   Collection Time: 08/04/19 10:32 PM   Specimen: BLOOD LEFT FOREARM  Result Value Ref Range Status   Specimen Description   Final    BLOOD LEFT FOREARM Performed at [facility], [address], [place], [place] [address]    Special Requests   Final  BOTTLES DRAWN AEROBIC AND ANAEROBIC Blood Culture results may not be optimal due to an excessive volume of blood received in culture bottles Performed at [facility], [address] [place], [place] [address]    Culture  Setup Time   Final    GRAM NEGATIVE RODS AEROBIC BOTTLE ONLY CRITICAL VALUE NOTED.  VALUE IS CONSISTENT WITH PREVIOUSLY REPORTED AND CALLED VALUE. Performed at [facility], [address] [place], [place] [address]    Culture (A)  Final    KLEBSIELLA PNEUMONIAE CULTURE REINCUBATED FOR BETTER GROWTH Performed at [facility], [address] [place], [place] [address]    Report Status PENDING  Incomplete   Organism ID, Bacteria KLEBSIELLA PNEUMONIAE  Final      Susceptibility   Klebsiella pneumoniae - MIC*    AMPICILLIN >=32 RESISTANT Resistant      CEFAZOLIN <=4 SENSITIVE Sensitive     CEFEPIME <=1 SENSITIVE Sensitive     CEFTAZIDIME <=1 SENSITIVE Sensitive     CEFTRIAXONE <=1 SENSITIVE Sensitive     CIPROFLOXACIN <=0.25 SENSITIVE Sensitive     GENTAMICIN <=1 SENSITIVE Sensitive     IMIPENEM <=0.25 SENSITIVE Sensitive     TRIMETH/SULFA <=20 SENSITIVE Sensitive     AMPICILLIN/SULBACTAM 8 SENSITIVE Sensitive     PIP/TAZO <=4 SENSITIVE Sensitive     Extended ESBL NEGATIVE Sensitive     * KLEBSIELLA PNEUMONIAE  Blood culture (routine x 2)     Status: Abnormal   Collection Time: 08/04/19 10:32 PM   Specimen: BLOOD  Result Value Ref Range Status   Specimen Description   Final    BLOOD RIGHT FOREARM Performed at [facility], [address] [place], [place] [address]    Special Requests   Final    BOTTLES DRAWN AEROBIC AND ANAEROBIC Blood Culture adequate volume Performed at [facility], [address] [place], [place] [address]    Culture  Setup Time   Final    GRAM NEGATIVE RODS AEROBIC BOTTLE ONLY CRITICAL RESULT CALLED TO, READ BACK BY AND VERIFIED WITH: CHRISTINE KATSOUDAS AT 1309 ON 08/05/2019 [facility].    Culture (A)  Final    KLEBSIELLA PNEUMONIAE SUSCEPTIBILITIES PERFORMED ON PREVIOUS CULTURE WITHIN THE LAST 5 DAYS. Performed at [facility], [address] [place], [place] [address]    Report Status 08/07/2019 FINAL  Final  SARS Coronavirus 2 [facility](Hospital order, Performed in [facility] hospital lab) Nasopharyngeal     Status: None   Collection Time: 08/04/19 10:32 PM   Specimen: Nasopharyngeal  Result Value Ref Range Status   SARS Coronavirus 2 NEGATIVE NEGATIVE Final    Comment: (NOTE) If result is NEGATIVE SARS-CoV-2 target nucleic acids are NOT DETECTED. The SARS-CoV-2 RNA is generally detectable in upper and lower  respiratory specimens during the acute phase of infection. The lowest  concentration of SARS-CoV-2 viral copies this assay can detect is 250  copies / mL. A negative result  does not preclude SARS-CoV-2 infection  and should not be used as the sole basis for treatment or other  patient management decisions.  A negative result may occur with  improper specimen collection / handling, submission of specimen other  than nasopharyngeal swab, presence of viral mutation(s) within the  areas targeted by this assay, and inadequate number of viral copies  (<250 copies / mL). A negative result must be combined with clinical  observations, patient history, and epidemiological information. If result is POSITIVE SARS-CoV-2 target nucleic acids are DETECTED.  The SARS-CoV-2 RNA is generally detectable in upper and lower  respiratory specimens dur ing the acute phase of infection.  Positive  results are indicative of active infection with SARS-CoV-2.  Clinical  correlation with patient history and other diagnostic information is  necessary to determine patient infection status.  Positive results do  not rule out bacterial infection or co-infection with other viruses. If result is PRESUMPTIVE POSTIVE SARS-CoV-2 nucleic acids MAY BE PRESENT.   A presumptive positive result was obtained on the submitted specimen  and confirmed on repeat testing.  While 2019 novel coronavirus  (SARS-CoV-2) nucleic acids may be present in the submitted sample  additional confirmatory testing may be necessary for epidemiological  and / or clinical management purposes  to differentiate between  SARS-CoV-2 and other Sarbecovirus currently known to infect humans.  If clinically indicated additional testing with an alternate test  methodology [phone] is advised. The SARS-CoV-2 RNA is generally  detectable in upper and lower respiratory sp ecimens during the acute  phase of infection. The expected result is Negative. Fact Sheet for Patients:  BoilerBrush.com.cyhttps://www.fda.gov/media/136312/download Fact Sheet for Healthcare Providers: https://pope.com/https://www.fda.gov/media/136313/download This test is not yet approved or  cleared by the [place] FDA and has been authorized for detection and/or diagnosis of SARS-CoV-2 by FDA under an Emergency Use Authorization (EUA).  This EUA will remain in effect (meaning this test can be used) for the duration of the COVID-19 declaration under Section 564(b)(1) of the Act, 21 U.S.C. section 360bbb-3(b)(1), unless the authorization is terminated or revoked sooner. Performed at [facility], [address] [place], [place] [address]   Urine Culture     Status: Abnormal   Collection Time: 08/04/19 10:32 PM   Specimen: Urine, Random  Result Value Ref Range Status   Specimen Description   Final    URINE, RANDOM Performed at [facility], [address] [place], [place] [address]    Special Requests   Final    NONE Performed at [facility], [address] [place], [place] [address]    Culture >=100,000 COLONIES/mL ESCHERICHIA COLI (A)  Final   Report Status 08/06/2019 FINAL  Final   Organism ID, Bacteria ESCHERICHIA COLI (A)  Final      Susceptibility   Escherichia coli - MIC*    AMPICILLIN >=32 RESISTANT Resistant     CEFAZOLIN <=4 SENSITIVE Sensitive     CEFTRIAXONE <=1 SENSITIVE Sensitive     CIPROFLOXACIN 1 SENSITIVE Sensitive     GENTAMICIN <=1 SENSITIVE Sensitive     IMIPENEM <=0.25 SENSITIVE Sensitive     NITROFURANTOIN <=16 SENSITIVE Sensitive     TRIMETH/SULFA <=20 SENSITIVE Sensitive     AMPICILLIN/SULBACTAM 16 INTERMEDIATE Intermediate     PIP/TAZO <=4 SENSITIVE Sensitive     Extended ESBL NEGATIVE Sensitive     * >=100,000 COLONIES/mL ESCHERICHIA COLI  Blood Culture ID Panel (Reflexed)     Status: Abnormal   Collection Time: 08/04/19 10:32 PM  Result Value Ref Range Status   Enterococcus species NOT DETECTED NOT DETECTED Final   Listeria monocytogenes NOT DETECTED NOT DETECTED Final   Staphylococcus species NOT DETECTED NOT DETECTED Final   Staphylococcus aureus (BCID) NOT DETECTED NOT DETECTED Final    Streptococcus species NOT DETECTED NOT DETECTED Final   Streptococcus agalactiae NOT DETECTED NOT DETECTED Final   Streptococcus pneumoniae NOT DETECTED NOT DETECTED Final   Streptococcus pyogenes NOT DETECTED NOT DETECTED Final   Acinetobacter baumannii NOT DETECTED NOT DETECTED Final   Enterobacteriaceae species  DETECTED (A) NOT DETECTED Final    Comment: Enterobacteriaceae represent a large family of gram-negative bacteria, not a single organism. CRITICAL RESULT CALLED TO, READ BACK BY AND VERIFIED WITH: CHRISTINE KATSOUDAS AT 1660 ON 08/05/2019 [place].    Enterobacter cloacae complex NOT DETECTED NOT DETECTED Final   Escherichia coli NOT DETECTED NOT DETECTED Final   Klebsiella oxytoca NOT DETECTED NOT DETECTED Final   Klebsiella pneumoniae DETECTED (A) NOT DETECTED Final    Comment: CRITICAL RESULT CALLED TO, READ BACK BY AND VERIFIED WITH: CHRISTINE KATSOUDAS AT 1309 ON 08/05/2019 [place].    Proteus species NOT DETECTED NOT DETECTED Final   Serratia marcescens NOT DETECTED NOT DETECTED Final   Carbapenem resistance NOT DETECTED NOT DETECTED Final   Haemophilus influenzae NOT DETECTED NOT DETECTED Final   Neisseria meningitidis NOT DETECTED NOT DETECTED Final   Pseudomonas aeruginosa NOT DETECTED NOT DETECTED Final   Candida albicans NOT DETECTED NOT DETECTED Final   Candida glabrata NOT DETECTED NOT DETECTED Final   Candida krusei NOT DETECTED NOT DETECTED Final   Candida parapsilosis NOT DETECTED NOT DETECTED Final   Candida tropicalis NOT DETECTED NOT DETECTED Final    Comment: Performed at [facility], [address], [place], [place] [address]    RADIOLOGY:  Dg C-arm 1-60 Min-no Report  Result Date: 08/08/2019 Fluoroscopy was utilized by the requesting physician.  No radiographic interpretation.    Follow up with PCP in 1 week.  Management plans discussed with the patient, family and they are in agreement.  CODE STATUS: DNR    Code Status Orders  (From  admission, onward)         Start     Ordered   08/05/19 0222  Do not attempt resuscitation (DNR)  Continuous    Question Answer Comment  In the event of cardiac or respiratory ARREST Do not call a "code blue"   In the event of cardiac or respiratory ARREST Do not perform Intubation, CPR, defibrillation or ACLS   In the event of cardiac or respiratory ARREST Use medication by any route, position, wound care, and other measures to relive pain and suffering. May use oxygen, suction and manual treatment of airway obstruction as needed for comfort.      08/05/19 0221        Code Status History    Date Active Date Inactive Code Status Order ID Comments User Context   12/14/2018 2113 12/15/2018 1727 DNR [id number]  Arta Silence, MD Inpatient   12/14/2018 1123 12/14/2018 2113 Full Code [id number]  Hillary Bow, MD ED   10/07/2018 1818 10/09/2018 1637 DNR [id number]  Gorden Harms, MD ED   10/07/2018 1818 10/07/2018 1818 Full Code [id number]  Salary, Avel Peace, MD ED   Advance Care Planning Activity    Advance Directive Documentation     Most Recent Value  Type of Advance Directive  Living will  Pre-existing out of facility DNR order (yellow form or pink MOST form)  -  "MOST" Form in Place?  -      TOTAL TIME TAKING CARE OF THIS PATIENT ON DAY OF DISCHARGE: more than 34 minutes.   Saundra Shelling M.D on 08/09/2019 at 11:53 AM  Between 7am to 6pm - Pager - [phone]  After 6pm go to [email or web address] - password [email or web address] [facility]  Office  [phone]  CC: Primary care physician; Einar Pheasant, MD  Note: This dictation was prepared with Dragon dictation along with smaller phrase technology. Any transcriptional errors that  result from this process are unintentional.

## 2019-08-09 NOTE — Progress Notes (Signed)
MD ordered patient to be discharged home.  Discharge instructions were reviewed with the patient and she voiced understanding.  Follow-up appointment was made.  Prescriptions sent to the patients pharmacy.  IV was removed with catheter intact.  All patients questions were answered.  Patient left via wheelchair escorted by NT.  

## 2019-08-09 NOTE — Progress Notes (Signed)
Tanya Cole , MD [address], [address], [place], [place], [id number] [address], [place], [place], [address] Phone: [phone]  Fax: [phone]   Tanya Cole is being followed for choledocholithiasis  Subjective: Doing well no pain no discomfort.   Objective: Vital signs in last 24 hours: Vitals:   08/08/19 1624 08/08/19 2028 08/09/19 0446 08/09/19 0627  BP: (!) 155/73 (!) 166/65  (!) 154/71  Pulse: 63 65  68  Resp:  18  20  Temp:  98.8 F (37.1 C)  98.9 F (37.2 C)  TempSrc:  Oral  Oral  SpO2:  98%  98%  Weight:   56.9 kg   Height:       Weight change: 0 kg  Intake/Output Summary (Last 24 hours) at 08/09/2019 [id number] Last data filed at 08/09/2019 0456 Gross per 24 hour  Intake 2962.78 ml  Output 700 ml  Net 2262.78 ml     Exam: Heart:: Regular rate and rhythm, S1S2 present or without murmur or extra heart sounds Lungs: normal, clear to auscultation and clear to auscultation and percussion Abdomen: soft, nontender, normal bowel sounds   Lab Results: @LABTEST2 @ Micro Results: Recent Results (from the past 240 hour(s))  Blood culture (routine x 2)     Status: Abnormal (Preliminary result)   Collection Time: 08/04/19 10:32 PM   Specimen: BLOOD LEFT FOREARM  Result Value Ref Range Status   Specimen Description   Final    BLOOD LEFT FOREARM Performed at [facility], [address] [place], [place] [address]    Special Requests   Final    BOTTLES DRAWN AEROBIC AND ANAEROBIC Blood Culture results may not be optimal due to an excessive volume of blood received in culture bottles Performed at [facility], [address] [place], [place] [address]    Culture  Setup Time   Final    GRAM NEGATIVE RODS AEROBIC BOTTLE ONLY CRITICAL VALUE NOTED.  VALUE IS CONSISTENT WITH PREVIOUSLY REPORTED AND CALLED VALUE. Performed at [facility], [address] [place], [place] [address]    Culture (A)  Final    KLEBSIELLA  PNEUMONIAE CULTURE REINCUBATED FOR BETTER GROWTH Performed at [facility], [address] [place], [place] [address]    Report Status PENDING  Incomplete   Organism ID, Bacteria KLEBSIELLA PNEUMONIAE  Final      Susceptibility   Klebsiella pneumoniae - MIC*    AMPICILLIN >=32 RESISTANT Resistant     CEFAZOLIN <=4 SENSITIVE Sensitive     CEFEPIME <=1 SENSITIVE Sensitive     CEFTAZIDIME <=1 SENSITIVE Sensitive     CEFTRIAXONE <=1 SENSITIVE Sensitive     CIPROFLOXACIN <=0.25 SENSITIVE Sensitive     GENTAMICIN <=1 SENSITIVE Sensitive     IMIPENEM <=0.25 SENSITIVE Sensitive     TRIMETH/SULFA <=20 SENSITIVE Sensitive     AMPICILLIN/SULBACTAM 8 SENSITIVE Sensitive     PIP/TAZO <=4 SENSITIVE Sensitive     Extended ESBL NEGATIVE Sensitive     * KLEBSIELLA PNEUMONIAE  Blood culture (routine x 2)     Status: Abnormal   Collection Time: 08/04/19 10:32 PM   Specimen: BLOOD  Result Value Ref Range Status   Specimen Description   Final    BLOOD RIGHT FOREARM Performed at [facility], [address] [place], [place] [address]    Special Requests   Final    BOTTLES DRAWN AEROBIC AND ANAEROBIC Blood Culture adequate volume Performed at [facility],  [place], [name] [address]    Culture  Setup Time   Final    GRAM NEGATIVE RODS AEROBIC BOTTLE ONLY CRITICAL RESULT CALLED TO, READ BACK BY AND VERIFIED WITH: CHRISTINE KATSOUDAS AT 1309 ON 08/05/2019 [place].    Culture (A)  Final    KLEBSIELLA PNEUMONIAE SUSCEPTIBILITIES PERFORMED ON PREVIOUS CULTURE WITHIN THE LAST 5 DAYS. Performed at [facility], [place] [address], [place], [place] [address]    Report Status 08/07/2019 FINAL  Final  SARS Coronavirus 2 [facility] order, Performed in [facility] hospital lab) Nasopharyngeal     Status: None   Collection Time: 08/04/19 10:32 PM   Specimen: Nasopharyngeal  Result Value Ref Range Status   SARS Coronavirus 2 NEGATIVE NEGATIVE Final    Comment:  (NOTE) If result is NEGATIVE SARS-CoV-2 target nucleic acids are NOT DETECTED. The SARS-CoV-2 RNA is generally detectable in upper and lower  respiratory specimens during the acute phase of infection. The lowest  concentration of SARS-CoV-2 viral copies this assay can detect is 250  copies / mL. A negative result does not preclude SARS-CoV-2 infection  and should not be used as the sole basis for treatment or other  patient management decisions.  A negative result may occur with  improper specimen collection / handling, submission of specimen other  than nasopharyngeal swab, presence of viral mutation(s) within the  areas targeted by this assay, and inadequate number of viral copies  (<250 copies / mL). A negative result must be combined with clinical  observations, patient history, and epidemiological information. If result is POSITIVE SARS-CoV-2 target nucleic acids are DETECTED. The SARS-CoV-2 RNA is generally detectable in upper and lower  respiratory specimens dur ing the acute phase of infection.  Positive  results are indicative of active infection with SARS-CoV-2.  Clinical  correlation with patient history and other diagnostic information is  necessary to determine patient infection status.  Positive results do  not rule out bacterial infection or co-infection with other viruses. If result is PRESUMPTIVE POSTIVE SARS-CoV-2 nucleic acids MAY BE PRESENT.   A presumptive positive result was obtained on the submitted specimen  and confirmed on repeat testing.  While 2019 novel coronavirus  (SARS-CoV-2) nucleic acids may be present in the submitted sample  additional confirmatory testing may be necessary for epidemiological  and / or clinical management purposes  to differentiate between  SARS-CoV-2 and other Sarbecovirus currently known to infect humans.  If clinically indicated additional testing with an alternate test  methodology [phone]) is advised. The SARS-CoV-2 RNA is  generally  detectable in upper and lower respiratory sp ecimens during the acute  phase of infection. The expected result is Negative. Fact Sheet for Patients:  StrictlyIdeas.no Fact Sheet for Healthcare Providers: BankingDealers.co.za This test is not yet approved or cleared by the [place] FDA and has been authorized for detection and/or diagnosis of SARS-CoV-2 by FDA under an Emergency Use Authorization (EUA).  This EUA will remain in effect (meaning this test can be used) for the duration of the COVID-19 declaration under Section 564(b)(1) of the Act, 21 U.S.C. section 360bbb-3(b)(1), unless the authorization is terminated or revoked sooner. Performed at [facility], [address], [place], [place] [address]   Urine Culture     Status: Abnormal   Collection Time: 08/04/19 10:32 PM   Specimen: Urine, Random  Result Value Ref Range Status   Specimen Description   Final    URINE, RANDOM Performed at [facility],  [address] [place], [place] [address]    Special Requests   Final    NONE Performed at [facility], [address] [place], [place] [address]    Culture >=100,000 COLONIES/mL ESCHERICHIA COLI (A)  Final   Report Status 08/06/2019 FINAL  Final   Organism ID, Bacteria ESCHERICHIA COLI (A)  Final      Susceptibility   Escherichia coli - MIC*    AMPICILLIN >=32 RESISTANT Resistant     CEFAZOLIN <=4 SENSITIVE Sensitive     CEFTRIAXONE <=1 SENSITIVE Sensitive     CIPROFLOXACIN 1 SENSITIVE Sensitive     GENTAMICIN <=1 SENSITIVE Sensitive     IMIPENEM <=0.25 SENSITIVE Sensitive     NITROFURANTOIN <=16 SENSITIVE Sensitive     TRIMETH/SULFA <=20 SENSITIVE Sensitive     AMPICILLIN/SULBACTAM 16 INTERMEDIATE Intermediate     PIP/TAZO <=4 SENSITIVE Sensitive     Extended ESBL NEGATIVE Sensitive     * >=100,000 COLONIES/mL ESCHERICHIA COLI  Blood Culture ID Panel (Reflexed)     Status:  Abnormal   Collection Time: 08/04/19 10:32 PM  Result Value Ref Range Status   Enterococcus species NOT DETECTED NOT DETECTED Final   Listeria monocytogenes NOT DETECTED NOT DETECTED Final   Staphylococcus species NOT DETECTED NOT DETECTED Final   Staphylococcus aureus (BCID) NOT DETECTED NOT DETECTED Final   Streptococcus species NOT DETECTED NOT DETECTED Final   Streptococcus agalactiae NOT DETECTED NOT DETECTED Final   Streptococcus pneumoniae NOT DETECTED NOT DETECTED Final   Streptococcus pyogenes NOT DETECTED NOT DETECTED Final   Acinetobacter baumannii NOT DETECTED NOT DETECTED Final   Enterobacteriaceae species DETECTED (A) NOT DETECTED Final    Comment: Enterobacteriaceae represent a large family of gram-negative bacteria, not a single organism. CRITICAL RESULT CALLED TO, READ BACK BY AND VERIFIED WITH: CHRISTINE KATSOUDAS AT 1309 ON 08/05/2019 [facility].    Enterobacter cloacae complex NOT DETECTED NOT DETECTED Final   Escherichia coli NOT DETECTED NOT DETECTED Final   Klebsiella oxytoca NOT DETECTED NOT DETECTED Final   Klebsiella pneumoniae DETECTED (A) NOT DETECTED Final    Comment: CRITICAL RESULT CALLED TO, READ BACK BY AND VERIFIED WITH: CHRISTINE KATSOUDAS AT 1309 ON 08/05/2019 [facility].    Proteus species NOT DETECTED NOT DETECTED Final   Serratia marcescens NOT DETECTED NOT DETECTED Final   Carbapenem resistance NOT DETECTED NOT DETECTED Final   Haemophilus influenzae NOT DETECTED NOT DETECTED Final   Neisseria meningitidis NOT DETECTED NOT DETECTED Final   Pseudomonas aeruginosa NOT DETECTED NOT DETECTED Final   Candida albicans NOT DETECTED NOT DETECTED Final   Candida glabrata NOT DETECTED NOT DETECTED Final   Candida krusei NOT DETECTED NOT DETECTED Final   Candida parapsilosis NOT DETECTED NOT DETECTED Final   Candida tropicalis NOT DETECTED NOT DETECTED Final    Comment: Performed at [facility], [address], [place], [place] [address]   Studies/Results:  Dg C-arm 1-60 Min-no Report  Result Date: 08/08/2019 Fluoroscopy was utilized by the requesting physician.  No radiographic interpretation.   Medications: I have reviewed the patient's current medications. Scheduled Meds: . docusate sodium  100 mg Oral BID  . metoprolol tartrate  12.5 mg Oral BID  . mometasone-formoterol  2 puff Inhalation BID  . promethazine  12.5 mg Intravenous Once   Continuous Infusions: . sodium chloride Stopped (08/07/19 0454)  . 0.9 % NaCl with KCl 20 mEq / L 100 mL/hr at 08/09/19 0456  . cefTRIAXone (ROCEPHIN)  IV 2 g (08/09/19 0827)  PRN Meds:.sodium chloride, acetaminophen **OR** acetaminophen, albuterol, morphine injection, nitroGLYCERIN, ondansetron **OR** ondansetron (ZOFRAN) IV   Assessment: Active Problems:   Cholecystitis   Choledocholithiasis  [place] Mae Baugh[place] a 85 y.o.y/o femaleadmitted with acute abdominal pain, elevated liver function tests including a total bilirubin of 2.6, direct component 1.6. Elevated transaminases and alkaline phosphatase. Right upper quadrant ultrasound shows a common bile duct diameter of 7 mm. There are features of acute cholecystitis and sonographic evidence of positive Murphy's sign. I have been consulted for choledocholithiasis. MRCP shows cholidocholithiasis -status post ERCP yesterday.  Doing well no complaints.    I would expect LFTs to start declining in the next few days.  Suggest to repeat LFTs by primary care physician in 3 to 7 days.  Refer back to GI if needed. Advance diet as tolerated.  I will sign off.  Please call me if any further GI concerns or questions.  We would like to thank you for the opportunity to participate in the care of Tanya Cole.   LOS: 4 days   Tanya MoodKiran Niguel Moure, MD 08/09/2019, 9:38 AM

## 2019-08-10 LAB — CULTURE, BLOOD (ROUTINE X 2)

## 2019-08-12 ENCOUNTER — Telehealth: Payer: Self-pay

## 2019-08-12 NOTE — Telephone Encounter (Signed)
Unable to reach patient for transitional care management. First attempt. Will follow as appropriate.  

## 2019-08-13 ENCOUNTER — Other Ambulatory Visit: Payer: Self-pay

## 2019-08-13 ENCOUNTER — Ambulatory Visit (INDEPENDENT_AMBULATORY_CARE_PROVIDER_SITE_OTHER): Payer: Medicare HMO

## 2019-08-13 DIAGNOSIS — E538 Deficiency of other specified B group vitamins: Secondary | ICD-10-CM | POA: Diagnosis not present

## 2019-08-13 NOTE — Telephone Encounter (Signed)
Second attempt to reach patient for transitional care management. No answer. Left a message that I will call back. Hospital follow up appointment has been scheduled prior to discharge on 08/16/19 at 11:00.

## 2019-08-13 NOTE — Progress Notes (Addendum)
Patient presented today for B12 injection.  Administered IM in the right deltoid.  Patient tolerated well with no signs of distress.  Reviewed.  Dr Scott  

## 2019-08-14 DIAGNOSIS — E538 Deficiency of other specified B group vitamins: Secondary | ICD-10-CM

## 2019-08-14 MED ORDER — CYANOCOBALAMIN 1000 MCG/ML IJ SOLN
1000.0000 ug | Freq: Once | INTRAMUSCULAR | Status: AC
  Administered 2019-08-14: 1000 ug via INTRAMUSCULAR

## 2019-08-14 NOTE — Telephone Encounter (Signed)
Transition Care Management Follow-up Telephone Call   Date discharged? 08/09/19   How have you been since you were released from the hospital? Patient states, "I am doing just fine, everything is normal." Denies ABD pain, N/V/D.    Do you understand why you were in the hospital? Patient states, "Yes, my gallbladder gave me a fit."   Do you understand the discharge instructions? Yes, eat smaller portions, follow up with pcp.   Where were you discharged to? Home.   Items Reviewed:  Medications reviewed: Yes, stop IMDUR. Taking all other medications as directed.   Allergies reviewed: Yes, none new.  Dietary changes reviewed: Yes, regular. Eat small portions.   Referrals reviewed: Yes.    Functional Questionnaire:   Activities of Daily Living (ADLs):   She states they are independent in the following: Independent in all ADLs.  States they require assistance with the following: She does not require assistance with ADLs at this time.    Any transportation issues/concerns?: None at this time. Son in law to bring to appointment.    Any patient concerns? None at this time.    Confirmed importance and date/time of follow-up visits scheduled Yes, appointment 08/16/19 @ 11:00 in office.   Provider Appointment booked with Dr. Nicki Reaper, pcp.  Confirmed with patient if condition begins to worsen call PCP or go to the ER.  Patient was given the office number and encouraged to call back with question or concerns.  : Yes.

## 2019-08-14 NOTE — Anesthesia Postprocedure Evaluation (Signed)
Anesthesia Post Note  Patient: Tanya Cole  Procedure(s) Performed: ENDOSCOPIC RETROGRADE CHOLANGIOPANCREATOGRAPHY (ERCP) WITH PROPOFOL (N/A )  Patient location during evaluation: PACU Anesthesia Type: General Level of consciousness: awake and alert Pain management: pain level controlled Vital Signs Assessment: post-procedure vital signs reviewed and stable Respiratory status: spontaneous breathing, nonlabored ventilation, respiratory function stable and patient connected to nasal cannula oxygen Cardiovascular status: blood pressure returned to baseline and stable Postop Assessment: no apparent nausea or vomiting Anesthetic complications: no     Last Vitals:  Vitals:   08/08/19 2028 08/09/19 0627  BP: (!) 166/65 (!) 154/71  Pulse: 65 68  Resp: 18 20  Temp: 37.1 C 37.2 C  SpO2: 98% 98%    Last Pain:  Vitals:   08/09/19 0834  TempSrc:   PainSc: 0-No pain                 Molli Barrows

## 2019-08-15 ENCOUNTER — Other Ambulatory Visit: Payer: Self-pay

## 2019-08-15 ENCOUNTER — Emergency Department
Admission: EM | Admit: 2019-08-15 | Discharge: 2019-08-15 | Disposition: A | Discharge status: Home / Self Care | Payer: Medicare HMO | Attending: Emergency Medicine | Admitting: Emergency Medicine

## 2019-08-15 ENCOUNTER — Emergency Department: Payer: Medicare HMO

## 2019-08-15 ENCOUNTER — Telehealth: Payer: Self-pay

## 2019-08-15 DIAGNOSIS — I10 Essential (primary) hypertension: Secondary | ICD-10-CM | POA: Insufficient documentation

## 2019-08-15 DIAGNOSIS — Z79899 Other long term (current) drug therapy: Secondary | ICD-10-CM | POA: Insufficient documentation

## 2019-08-15 DIAGNOSIS — K828 Other specified diseases of gallbladder: Secondary | ICD-10-CM | POA: Diagnosis not present

## 2019-08-15 DIAGNOSIS — K8020 Calculus of gallbladder without cholecystitis without obstruction: Secondary | ICD-10-CM | POA: Diagnosis not present

## 2019-08-15 DIAGNOSIS — R1013 Epigastric pain: Secondary | ICD-10-CM

## 2019-08-15 DIAGNOSIS — I2510 Atherosclerotic heart disease of native coronary artery without angina pectoris: Secondary | ICD-10-CM | POA: Diagnosis not present

## 2019-08-15 DIAGNOSIS — K811 Chronic cholecystitis: Secondary | ICD-10-CM | POA: Diagnosis not present

## 2019-08-15 DIAGNOSIS — Z7984 Long term (current) use of oral hypoglycemic drugs: Secondary | ICD-10-CM | POA: Insufficient documentation

## 2019-08-15 DIAGNOSIS — E119 Type 2 diabetes mellitus without complications: Secondary | ICD-10-CM | POA: Insufficient documentation

## 2019-08-15 DIAGNOSIS — Z7982 Long term (current) use of aspirin: Secondary | ICD-10-CM | POA: Diagnosis not present

## 2019-08-15 DIAGNOSIS — J45909 Unspecified asthma, uncomplicated: Secondary | ICD-10-CM | POA: Diagnosis not present

## 2019-08-15 DIAGNOSIS — R52 Pain, unspecified: Secondary | ICD-10-CM | POA: Diagnosis not present

## 2019-08-15 DIAGNOSIS — R1084 Generalized abdominal pain: Secondary | ICD-10-CM | POA: Diagnosis not present

## 2019-08-15 DIAGNOSIS — I959 Hypotension, unspecified: Secondary | ICD-10-CM | POA: Diagnosis not present

## 2019-08-15 DIAGNOSIS — R112 Nausea with vomiting, unspecified: Secondary | ICD-10-CM | POA: Diagnosis not present

## 2019-08-15 DIAGNOSIS — R0902 Hypoxemia: Secondary | ICD-10-CM | POA: Diagnosis not present

## 2019-08-15 LAB — URINALYSIS, COMPLETE (UACMP) WITH MICROSCOPIC
Bilirubin Urine: NEGATIVE
Glucose, UA: NEGATIVE mg/dL
Hgb urine dipstick: NEGATIVE
Ketones, ur: NEGATIVE mg/dL
Nitrite: NEGATIVE
Protein, ur: 30 mg/dL — AB
Specific Gravity, Urine: 1.025 (ref 1.005–1.030)
Waxy Casts, UA: 2
pH: 5 (ref 5.0–8.0)

## 2019-08-15 LAB — COMPREHENSIVE METABOLIC PANEL
ALT: 31 U/L (ref 0–44)
AST: 21 U/L (ref 15–41)
Albumin: 3.8 g/dL (ref 3.5–5.0)
Alkaline Phosphatase: 180 U/L — ABNORMAL HIGH (ref 38–126)
Anion gap: 13 (ref 5–15)
BUN: 18 mg/dL (ref 8–23)
CO2: 25 mmol/L (ref 22–32)
Calcium: 9.3 mg/dL (ref 8.9–10.3)
Chloride: 102 mmol/L (ref 98–111)
Creatinine, Ser: 0.74 mg/dL (ref 0.44–1.00)
GFR calc Af Amer: >60 mL/min (ref >60)
GFR calc non Af Amer: >60 mL/min (ref >60)
Glucose, Bld: 134 mg/dL — ABNORMAL HIGH (ref 70–99)
Potassium: 4.2 mmol/L (ref 3.5–5.1)
Sodium: 140 mmol/L (ref 135–145)
Total Bilirubin: 1 mg/dL (ref 0.3–1.2)
Total Protein: 7 g/dL (ref 6.5–8.1)

## 2019-08-15 LAB — CBC WITH DIFFERENTIAL/PLATELET
Abs Immature Granulocytes: 0.15 10*3/uL — ABNORMAL HIGH (ref 0.00–0.07)
Basophils Absolute: 0.1 10*3/uL (ref 0.0–0.1)
Basophils Relative: 0 %
Eosinophils Absolute: 0.4 10*3/uL (ref 0.0–0.5)
Eosinophils Relative: 2 %
HCT: 37.7 % (ref 36.0–46.0)
Hemoglobin: 12.3 g/dL (ref 12.0–15.0)
Immature Granulocytes: 1 %
Lymphocytes Relative: 8 %
Lymphs Abs: 1.4 10*3/uL (ref 0.7–4.0)
MCH: 28.3 pg (ref 26.0–34.0)
MCHC: 32.6 g/dL (ref 30.0–36.0)
MCV: 86.7 fL (ref 80.0–100.0)
Monocytes Absolute: 0.6 10*3/uL (ref 0.1–1.0)
Monocytes Relative: 4 %
Neutro Abs: 15.3 10*3/uL — ABNORMAL HIGH (ref 1.7–7.7)
Neutrophils Relative %: 85 %
Platelets: 419 10*3/uL — ABNORMAL HIGH (ref 150–400)
RBC: 4.35 MIL/uL (ref 3.87–5.11)
RDW: 14.9 % (ref 11.5–15.5)
WBC: 18 10*3/uL — ABNORMAL HIGH (ref 4.0–10.5)
nRBC: 0 % (ref 0.0–0.2)

## 2019-08-15 LAB — LIPASE, BLOOD: Lipase: 26 U/L (ref 11–51)

## 2019-08-15 LAB — TROPONIN I (HIGH SENSITIVITY): Troponin I (High Sensitivity): 6 ng/L (ref <18)

## 2019-08-15 NOTE — Telephone Encounter (Signed)
1. Get lipase checked  2. Is she still taking Keflex? 3. How severe is the pain- if mild and not getting worse then I should see her on Monday or tomorrow if any other physician available. If severe has to go to ER 4. How often diarrhea- if still ongoing and not on laxatives- needs stool testing for C diff  5. Ensure taking PPI

## 2019-08-15 NOTE — ED Notes (Signed)
Patient transported to Ultrasound 

## 2019-08-15 NOTE — Telephone Encounter (Signed)
Pt's daughter, Anderson Malta, called to request Dr. Georgeann Oppenheim advice. She states pt has been experiencing diarrhea, vomiting, and LUQ abdominal pain. Pt recently had an ERCP while admitted to [facility] and was prescribed Keflex. Pt daughter is concerned and wants advice on what she should do. I explained that I will relay this information to Dr. Vicente Males.  Pt is scheduled for an ED follow up with Dr. Vicente Males on 08-22-19.

## 2019-08-15 NOTE — Discharge Instructions (Signed)
Please schedule follow-up with Dr. Hampton Abbot as soon as possible regarding your gallbladder.  If in the meantime you have worsening pain in your stomach, fever, vomiting, or any other concerning symptoms, please return to the ER for reevaluation.

## 2019-08-15 NOTE — ED Provider Notes (Signed)
[facility] Emergency Department Provider Note   ____________________________________________   First MD Initiated Contact with Patient 08/15/19 1247     (approximate)  I have reviewed the triage vital signs and the nursing notes.   HISTORY  Chief Complaint Abdominal Pain    HPI Tanya Cole is a 85 y.o. female with past medical history of CAD, diabetes, hypertension, asthma, and polymyalgia rheumatica presents to the ED complaining of abdominal pain.  Patient reports that she started having sharp upper abdominal pain this morning around 9 or 10 AM.  Pain is constant and not exacerbated or alleviated by anything.  She states the pain is very similar to what she was experiencing during recent admission.  During that time, she was found to have choledocholithiasis and required ERCP with removal of stones.  She seemed to clinically improve and was discharged home 6 days ago, was doing well up until today.  With onset of pain she was feeling nauseous with 2 episodes of non-bilious and nonbloody emesis.  She denies any recent fevers and has not had any flank pain, dysuria, hematuria, cough, chest pain, or shortness of breath.        Past Medical History:  Diagnosis Date  . Aortic insufficiency    a. TTE 12/19: EF 55-60%, probable HK of the mid apical anterior septal myocardium, Gr1DD, mild AI, mildly dilated LA  . Asthma   . CAD (coronary artery disease)    a. NSTEMI 12/19; b. LHC 10/08/18: LM minimal luminal irregs, mLAD-1 95% s/p PCI/DES, mLAD-2 60%, LCx mild diffuse disease throughout, RCA minimal luminal irregs  . Diabetes mellitus ([place])   . Hypercholesterolemia   . Hypertension   . Osteopenia   . Polymyalgia rheumatica syndrome ([place])   . Reactive airway disease     Patient Active Problem List   Diagnosis Date Noted  . Cholecystitis 08/05/2019  . Choledocholithiasis   . Respiratory illness 06/18/2019  . CAD (coronary artery disease) 12/29/2018   . Non-ST elevation (NSTEMI) myocardial infarction ([place])   . Chest pain 10/07/2018  . Memory change 05/27/2018  . Osteoporosis 02/05/2018  . Weight loss 06/24/2017  . Abdominal pain, left lower quadrant 10/05/2016  . Long term current use of systemic steroids 05/16/2016  . Elevated erythrocyte sedimentation rate 05/04/2016  . Back pain 04/29/2016  . Fatigue 05/24/2015  . Health care maintenance 01/25/2015  . UTI (urinary tract infection) 07/07/2014  . Neuropathy 03/24/2014  . Diverticulitis 02/24/2013  . Reactive airway disease 09/15/2012  . Hypertension 09/14/2012  . Hypercholesterolemia 09/14/2012  . Diabetes mellitus with cardiac complication ([place]) 89/38/1017    Past Surgical History:  Procedure Laterality Date  . ABDOMINAL HYSTERECTOMY  1981   prolapse and bleeding, ovaries not removed  . BREAST EXCISIONAL BIOPSY Right   . CORONARY STENT INTERVENTION N/A 10/08/2018   Procedure: CORONARY STENT INTERVENTION;  Surgeon: Wellington Hampshire, MD;  Location: [place] [facility];  Service: Cardiovascular;  Laterality: N/A;  . ENDOSCOPIC RETROGRADE CHOLANGIOPANCREATOGRAPHY (ERCP) WITH PROPOFOL N/A 08/08/2019   Procedure: ENDOSCOPIC RETROGRADE CHOLANGIOPANCREATOGRAPHY (ERCP) WITH PROPOFOL;  Surgeon: Lucilla Lame, MD;  Location: [facility] ENDOS[facility];  Service: Endoscopy;  Laterality: N/A;  . LEFT HEART CATH AND CORONARY ANGIOGRAPHY N/A 10/08/2018   Procedure: LEFT HEART CATH AND CORONARY ANGIOGRAPHY;  Surgeon: Wellington Hampshire, MD;  Location: [place] [facility];  Service: Cardiovascular;  Laterality: N/A;  . UMBILICAL HERNIA REPAIR  7/94    Prior to Admission medications   Medication Sig Start Date  End Date Taking? Authorizing Provider  ADVAIR DISKUS 250-50 MCG/DOSE AEPB INHALE ONE PUFF BY MOUTH EVERY 12 HOURS. RINSE MOUTH AFTER EACH USE Patient taking differently: Inhale 1 puff into the lungs 2 (two) times daily.  02/17/16   Dale Humphrey, MD  alendronate (FOSAMAX) 70 MG tablet Take 70 mg  by mouth once a week.  11/06/17   [provider]  aspirin 81 MG tablet Take 81 mg by mouth daily.    [provider]  atorvastatin (LIPITOR) 40 MG tablet Take 1 tablet (40 mg total) by mouth daily at 6 PM. 10/18/18   Dunn, Raymon Mutton, PA-C  cephALEXin (KEFLEX) 500 MG capsule Take 1 capsule (500 mg total) by mouth 3 (three) times daily for 10 days. 08/09/19 08/19/19  Ihor Austin, MD  clopidogrel (PLAVIX) 75 MG tablet Take 1 tablet (75 mg total) by mouth daily with breakfast. 10/18/18   Dunn, Raymon Mutton, PA-C  cyanocobalamin (,VITAMIN B-12,) 1000 MCG/ML injection Inject 1,000 mcg into the muscle every 30 (thirty) days.     [provider]  fluticasone (FLONASE) 50 MCG/ACT nasal spray USE 2 SPRAY(S) IN EACH NOSTRIL ONCE DAILY Patient taking differently: Place 1 spray into both nostrils daily.  08/27/18   Dale Nyack, MD  lisinopril (ZESTRIL) 20 MG tablet Take 1 tablet (20 mg total) by mouth daily. 04/15/19   Dunn, Raymon Mutton, PA-C  metFORMIN (GLUCOPHAGE) 500 MG tablet TAKE 1 TABLET BY MOUTH TWICE DAILY WITH A MEAL Patient taking differently: Take 500 mg by mouth 2 (two) times daily with a meal.  05/15/19   Dale [place], MD  metoprolol tartrate (LOPRESSOR) 25 MG tablet Take 1 tablet (25 mg total) by mouth 2 (two) times daily. Patient taking differently: Take 12.5 mg by mouth 2 (two) times daily.  11/27/18   Sondra Barges, PA-C  Multiple Vitamins-Minerals (VISION FORMULA/LUTEIN PO) Take by mouth daily.    [provider]  nitroGLYCERIN (NITROSTAT) 0.4 MG SL tablet Place 1 tablet (0.4 mg total) under the tongue every 5 (five) minutes as needed for chest pain. 10/09/18   Adrian Saran, MD  PROAIR HFA 108 (90 Base) MCG/ACT inhaler Inhale 2 puffs into the lungs every 6 (six) hours as needed. Patient taking differently: Inhale 2 puffs into the lungs every 6 (six) hours as needed for wheezing or shortness of breath.  05/28/19   Dale [place], MD  Vitamin D, Ergocalciferol, (DRISDOL)  50000 units CAPS capsule Take 50,000 Units by mouth once a week. 06/24/18   [provider]    Allergies Tramadol  Family History  Problem Relation Age of Onset  . Heart attack Father   . Arthritis Mother   . Heart disease Mother   . Throat cancer Sister   . Parkinson's disease Sister   . COPD Brother     Social History Social History   Tobacco Use  . Smoking status: Never Smoker  . Smokeless tobacco: Never Used  Substance Use Topics  . Alcohol use: No    Alcohol/week: 0.0 standard drinks  . Drug use: No    Review of Systems  Constitutional: No fever/chills Eyes: No visual changes. ENT: No sore throat. Cardiovascular: Denies chest pain. Respiratory: Denies shortness of breath. Gastrointestinal: Positive for abdominal pain.  Positive for nausea and vomiting.  No diarrhea.  No constipation. Genitourinary: Negative for dysuria. Musculoskeletal: Negative for back pain. Skin: Negative for rash. Neurological: Negative for headaches, focal weakness or numbness.  ____________________________________________   PHYSICAL EXAM:  VITAL SIGNS: ED  Triage Vitals [08/15/19 1238]  Enc Vitals Group     BP      Pulse      Resp      Temp      Temp src      SpO2      Weight 120 lb (54.4 kg)     Height 5\' 1"  (1.549 m)     Head Circumference      Peak Flow      Pain Score 8     Pain Loc      Pain Edu?      Excl. in GC?     Constitutional: Alert and oriented. Eyes: Conjunctivae are normal. Head: Atraumatic. Nose: No congestion/rhinnorhea. Mouth/Throat: Mucous membranes are moist. Neck: Normal ROM Cardiovascular: Normal rate, regular rhythm. Grossly normal heart sounds. Respiratory: Normal respiratory effort.  No retractions. Lungs CTAB. Gastrointestinal: Soft and tender to palpation in the epigastrium with no rebound or guarding. No distention. Genitourinary: deferred Musculoskeletal: No lower extremity tenderness nor edema. Neurologic:  Normal speech and  language. No gross focal neurologic deficits are appreciated. Skin:  Skin is warm, dry and intact. No rash noted. Psychiatric: Mood and affect are normal. Speech and behavior are normal.  ____________________________________________   LABS (all labs ordered are listed, but only abnormal results are displayed)  Labs Reviewed  COMPREHENSIVE METABOLIC PANEL - Abnormal; Notable for the following components:      Result Value   Glucose, Bld 134 (*)    Alkaline Phosphatase 180 (*)    All other components within normal limits  CBC WITH DIFFERENTIAL/PLATELET - Abnormal; Notable for the following components:   WBC 18.0 (*)    Platelets 419 (*)    Neutro Abs 15.3 (*)    Abs Immature Granulocytes 0.15 (*)    All other components within normal limits  URINALYSIS, COMPLETE (UACMP) WITH MICROSCOPIC - Abnormal; Notable for the following components:   Color, Urine AMBER (*)    APPearance HAZY (*)    Protein, ur 30 (*)    Leukocytes,Ua TRACE (*)    Bacteria, UA RARE (*)    All other components within normal limits  LIPASE, BLOOD  TROPONIN I (HIGH SENSITIVITY)  TROPONIN I (HIGH SENSITIVITY)   ____________________________________________  EKG  ED ECG REPORT I, , the attending physician, personally viewed and interpreted this ECG.   Date: 08/15/2019  EKG Time: 12:59  Rate: 72  Rhythm: normal sinus rhythm  Axis: Normal  Intervals:none  ST&T Change: TWI laterally    PROCEDURES  Procedure(s) performed (including Critical Care):  Procedures   ____________________________________________   INITIAL IMPRESSION / ASSESSMENT AND PLAN / ED COURSE       85 year old female with history of gallstones and recent ERCP for removal of choledocholithiasis presenting to the ED with recurrent abdominal pain.  She describes it as very similar to her prior episodes.  Will check lipase and LFTs, along with right upper quadrant ultrasound to assess CBD.  Pain is reasonably well  controlled for this moment.  Low suspicion for cardiac etiology, however EKG does show lateral T wave inversions, which appear new.  Will screen troponin.  Lab work is reassuring as LFTs and lipase have now normalized since patient's recent admission.  Troponin within normal limits, doubt cardiac etiology.  Right upper quadrant ultrasound is equivocal for cholecystitis and general surgery was consulted to assist with disposition.  She was evaluated by general surgery and clinical picture thought to be consistent with chronic cholecystitis.  Given she  continues to be on aspirin and Plavix for her cardiac comorbidities, general surgery will plan on close outpatient follow-up for scheduling of cholecystectomy.  Patient feels much improved now, states pain is resolved and no longer feeling nauseous.  Counseled to return to the ED for new or worsening symptoms, patient agrees with plan.      ____________________________________________   FINAL CLINICAL IMPRESSION(S) / ED DIAGNOSES  Final diagnoses:  Epigastric pain  Chronic cholecystitis     ED Discharge Orders    None       Note:  This document was prepared using Dragon voice recognition software and may include unintentional dictation errors.   Chesley NoonJessup, Kerline Trahan, MD 08/15/19 [phone]

## 2019-08-15 NOTE — Telephone Encounter (Signed)
Pt daughter is calling for Dr. Georgeann Oppenheim nurse [facility] is [facility]

## 2019-08-15 NOTE — ED Triage Notes (Signed)
Pt arrives via [facility] from home for lower abdominal pain 8/10 that started this morning. Pt reports recent gallstone surgery, with discharge on Saturday. A&Ox4, NAD at this time.

## 2019-08-15 NOTE — Consult Note (Signed)
[facility] SUR[facility]ICAL CONSULTATION NOTE (initial) - cpt: 16109: [id number] (Outpatient/ED)   HISTORY OF PRESENT ILLNESS (HPI):  85 y.o. female [facility] ED today for evaluation of abdominal pain. Patient reports the acute onset of epigastric abdominal pain this morning. This onset fairly quickly after eating a bacon, egg, and cheese biscuit. The pain was sharp and burning. This radiated across her upper abdomen. Pain similar to recent presentation in which she had gallstone pancreatitis (s/p ERCP on 10/08). She had been doing well at home until today. She endorses associated nausea and 2 episodes of emesis. No fever, chills, jaundice, acholic stools, or urinary changes. Since coming to the ED, she reports that he pain has significantly improved and that her symptoms are nearly resolved. Again of note, she is s/p PCI in 09/2018 and has been on Plavix and ASA. Work up in the ED revealed a leukocytosis to 18K with normal bilirubin and lipase. RUQ US did show cholelithiasis without sonographic evidence of cholecystitis. Again, at time of my assessment patient reporting her pain is improved and nearly resolved.   Surgery is consulted by emergency medicine physician Dr. Chesley Noonharles Jessup, MD in this context for evaluation and management of RUQ abdominal pain.   PAST MEDICAL HISTORY (PMH):  Past Medical History:  Diagnosis Date  . Aortic insufficiency    a. TTE 12/19: EF 55-60%, probable HK of the mid apical anterior septal myocardium, Gr1DD, mild AI, mildly dilated LA  . Asthma   . CAD (coronary artery disease)    a. NSTEMI 12/19; b. LHC 10/08/18: LM minimal luminal irregs, mLAD-1 95% s/p PCI/DES, mLAD-2 60%, LCx mild diffuse disease throughout, RCA minimal luminal irregs  . Diabetes mellitus ([facility])   . Hypercholesterolemia   . Hypertension   . Osteopenia   . Polymyalgia rheumatica syndrome ([facility])   . Reactive airway disease      PAST SURGICAL HISTORY (PSH):  Past Surgical History:   Procedure Laterality Date  . ABDOMINAL HYSTERECTOMY  1981   prolapse and bleeding, ovaries not removed  . BREAST EXCISIONAL BIOPSY Right   . CORONARY STENT INTERVENTION N/A 10/08/2018   Procedure: CORONARY STENT INTERVENTION;  Surgeon: Iran OuchArida, Muhammad A, MD;  Location: [facility];  Service: Cardiovascular;  Laterality: N/A;  . ENDOSCOPIC RETROGRADE CHOLANGIOPANCREATOGRAPHY (ERCP) WITH PROPOFOL N/A 08/08/2019   Procedure: ENDOSCOPIC RETROGRADE CHOLANGIOPANCREATOGRAPHY (ERCP) WITH PROPOFOL;  Surgeon: Midge MiniumWohl, Darren, MD;  Location: [facility] ENDOSCOPY;  Service: Endoscopy;  Laterality: N/A;  . LEFT HEART CATH AND CORONARY ANGIOGRAPHY N/A 10/08/2018   Procedure: LEFT HEART CATH AND CORONARY ANGIOGRAPHY;  Surgeon: Iran OuchArida, Muhammad A, MD;  Location: [facility];  Service: Cardiovascular;  Laterality: N/A;  . UMBILICAL HERNIA REPAIR  7/94     MEDICATIONS:  Prior to Admission medications   Medication Sig Start Date End Date Taking? Authorizing Provider  ADVAIR DISKUS 250-50 MCG/DOSE AEPB INHALE ONE PUFF BY MOUTH EVERY 12 HOURS. RINSE MOUTH AFTER EACH USE Patient taking differently: Inhale 1 puff into the lungs 2 (two) times daily.  02/17/16   Dale DurhamScott, Charlene, MD  alendronate (FOSAMAX) 70 MG tablet Take 70 mg by mouth once a week.  11/06/17   [provider]  aspirin 81 MG tablet Take 81 mg by mouth daily.    [provider]  atorvastatin (LIPITOR) 40 MG tablet Take 1 tablet (40 mg total) by mouth daily at 6 PM. 10/18/18   Dunn, Raymon Muttonyan M, PA-C  cephALEXin (KEFLEX) 500 MG capsule Take 1 capsule (500 mg total)  by mouth 3 (three) times daily for 10 days. 08/09/19 08/19/19  Ihor Austin, MD  clopidogrel (PLAVIX) 75 MG tablet Take 1 tablet (75 mg total) by mouth daily with breakfast. 10/18/18   Dunn, Raymon Mutton, PA-C  cyanocobalamin (,VITAMIN B-12,) 1000 MCG/ML injection Inject 1,000 mcg into the muscle every 30 (thirty) days.     [provider]  fluticasone (FLONASE) 50  MCG/ACT nasal spray USE 2 SPRAY(S) IN EACH NOSTRIL ONCE DAILY Patient taking differently: Place 1 spray into both nostrils daily.  08/27/18   Dale [place], MD  lisinopril (ZESTRIL) 20 MG tablet Take 1 tablet (20 mg total) by mouth daily. 04/15/19   Dunn, Raymon Mutton, PA-C  metFORMIN (GLUCOPHAGE) 500 MG tablet TAKE 1 TABLET BY MOUTH TWICE DAILY WITH A MEAL Patient taking differently: Take 500 mg by mouth 2 (two) times daily with a meal.  05/15/19   Dale [place], MD  metoprolol tartrate (LOPRESSOR) 25 MG tablet Take 1 tablet (25 mg total) by mouth 2 (two) times daily. Patient taking differently: Take 12.5 mg by mouth 2 (two) times daily.  11/27/18   Sondra Barges, PA-C  Multiple Vitamins-Minerals (VISION FORMULA/LUTEIN PO) Take by mouth daily.    [provider]  nitroGLYCERIN (NITROSTAT) 0.4 MG SL tablet Place 1 tablet (0.4 mg total) under the tongue every 5 (five) minutes as needed for chest pain. 10/09/18   Adrian Saran, MD  PROAIR HFA 108 (90 Base) MCG/ACT inhaler Inhale 2 puffs into the lungs every 6 (six) hours as needed. Patient taking differently: Inhale 2 puffs into the lungs every 6 (six) hours as needed for wheezing or shortness of breath.  05/28/19   Dale [place], MD  Vitamin D, Ergocalciferol, (DRISDOL) 50000 units CAPS capsule Take 50,000 Units by mouth once a week. 06/24/18   [provider]     ALLERGIES:  Allergies  Allergen Reactions  . Tramadol Itching and Nausea And Vomiting     SOCIAL HISTORY:  Social History   Socioeconomic History  . Marital status: Widowed    Spouse name: Not on file  . Number of children: 3  . Years of education: Not on file  . Highest education level: Not on file  Occupational History  . Not on file  Social Needs  . Financial resource strain: Not hard at all  . Food insecurity    Worry: Never true    Inability: Never true  . Transportation needs    Medical: No    Non-medical: No  Tobacco Use  . Smoking status: Never  Smoker  . Smokeless tobacco: Never Used  Substance and Sexual Activity  . Alcohol use: No    Alcohol/week: 0.0 standard drinks  . Drug use: No  . Sexual activity: Never  Lifestyle  . Physical activity    Days per week: 2 days    Minutes per session: 10 min  . Stress: Not at all  Relationships  . Social Musician on phone: Not on file    Gets together: Not on file    Attends religious service: Not on file    Active member of club or organization: Not on file    Attends meetings of clubs or organizations: Not on file    Relationship status: Not on file  . Intimate partner violence    Fear of current or ex partner: Not on file    Emotionally abused: Not on file    Physically abused: Not on file  Forced sexual activity: Not on file  Other Topics Concern  . Not on file  Social History Narrative  . Not on file     FAMILY HISTORY:  Family History  Problem Relation Age of Onset  . Heart attack Father   . Arthritis Mother   . Heart disease Mother   . Throat cancer Sister   . Parkinson's disease Sister   . COPD Brother       REVIEW OF SYSTEMS:  Review of Systems  Constitutional: Negative for chills and fever.  HENT: Negative for congestion and sore throat.   Respiratory: Negative for cough and shortness of breath.   Cardiovascular: Negative for chest pain and palpitations.  Gastrointestinal: Positive for abdominal pain, nausea and vomiting. Negative for blood in stool, constipation, diarrhea and melena.  Genitourinary: Negative for dysuria and urgency.  Skin: Negative for itching and rash.  Neurological: Negative for dizziness and headaches.  All other systems reviewed and are negative.   VITAL SIGNS:  Temp:  [98.3 F (36.8 C)] 98.3 F (36.8 C) (10/15 1247) Pulse Rate:  [69-78] 78 (10/15 1335) Resp:  [14-26] 21 (10/15 1335) BP: (124-136)/(57-74) 124/57 (10/15 1332) SpO2:  [96 %-98 %] 97 % (10/15 1335) Weight:  [54.4 kg] 54.4 kg (10/15 1238)      Height: 5\' 1"  (154.9 cm) Weight: 54.4 kg BMI (Calculated): 22.69   INTAKE/OUTPUT:  No intake/output data recorded.  PHYSICAL EXAM:  Physical Exam Vitals signs and nursing note reviewed.  Constitutional:      General: She is not in acute distress.    Appearance: She is well-developed and normal weight. She is not ill-appearing.  HENT:     Head: Normocephalic and atraumatic.  Eyes:     General: No scleral icterus.    Extraocular Movements: Extraocular movements intact.  Cardiovascular:     Rate and Rhythm: Normal rate and regular rhythm.     Heart sounds: Normal heart sounds. No murmur. No friction rub. No gallop.   Pulmonary:     Effort: Pulmonary effort is normal. No respiratory distress.     Breath sounds: Normal breath sounds. No wheezing or rhonchi.  Abdominal:     General: There is no distension.     Palpations: Abdomen is soft.     Tenderness: There is abdominal tenderness (mild) in the epigastric area. There is no rebound.     Hernia: No hernia is present.  Genitourinary:    Comments: Deferred Skin:    General: Skin is warm and dry.     Coloration: Skin is not jaundiced or pale.  Neurological:     General: No focal deficit present.     Mental Status: She is alert and oriented to person, place, and time.  Psychiatric:        Mood and Affect: Mood normal.        Behavior: Behavior normal.      Labs:  CBC Latest Ref Rng & Units 08/15/2019 08/06/2019 08/04/2019  WBC 4.0 - 10.5 K/uL 18.0(H) 9.8 9.9  Hemoglobin 12.0 - 15.0 g/dL 04.512.3 10.0(L) 12.8  Hematocrit 36.0 - 46.0 % 37.7 31.1(L) 39.7  Platelets 150 - 400 K/uL 419(H) 197 256   CMP Latest Ref Rng & Units 08/15/2019 08/09/2019 08/08/2019  Glucose 70 - 99 mg/dL 409(W134(H) 119(J118(H) 478(G111(H)  BUN 8 - 23 mg/dL 18 <9(F<5(L) <6(O<5(L)  Creatinine 0.44 - 1.00 mg/dL 1.300.74 8.650.55 7.840.52  Sodium 135 - 145 mmol/L 140 141 142  Potassium 3.5 - 5.1 mmol/L 4.2 4.3  4.3  Chloride 98 - 111 mmol/L 102 112(H) 113(H)  CO2 22 - 32 mmol/L 25 22 24    Calcium 8.9 - 10.3 mg/dL 9.3 9.0 8.7(L)  Total Protein 6.5 - 8.1 g/dL 7.0 6.0(L) 5.9(L)  Total Bilirubin 0.3 - 1.2 mg/dL 1.0 1.3(H) 1.1  Alkaline Phos 38 - 126 U/L 180(H) 336(H) 268(H)  AST 15 - 41 U/L 21 120(H) 99(H)  ALT 0 - 44 U/L 31 153(H) 160(H)     Imaging studies:   RUQ [place] (08/15/2019) personally reviewed which shows cholelithiasis and mild gall bladder wall thickening and radiologist report reviewed:  IMPRESSION: Cholelithiasis with mild gallbladder wall thickening. No sonographic Percell Miller sign was noted. Findings are equivocal for acute cholecystitis. Consider nuclear medicine hepatobiliary scan if further imaging evaluation is warranted clinically.   Assessment/Plan: (ICD-10's: K81.1) 85 y.o. female with now improved/resolved upper abdominal pain likely attributable to cholelithiasis without evidence of cholecystitis or choledocholithiasis given reassuring examination and lab results, which again is complicated by pertinent comorbidities including MI s/p PCI <1 year ago on Plavix.   - Given improvement in pain and symptoms I think it is reasonable to continue POC for outpatient management. Her pain onset after eating fatty foods which likely exacerbated already known cholelithiasis vs chronic cholecystitis   - Her cardiac history and need for Plavix for at least 1 year following PCI makes surgical intervention complicated. We will continue to follow her closely outpatient and work with her cardiologist (Dr Fletcher Anon) to determine when we can appropriately stop her Plavix and proceed with outpatient interval cholecystectomy.   - We had extensive discussion regarding dietary restrictions (ex: avoiding fatty foods) to limit flare up and decrease risk of recurrence   - Discussed return precautions including but not limited to fever, worsening RUQ pain, N/V, yellowing of eyes/skin.   - She has follow up with her PCP tomorrow  - She was encouraged to keep her follow up appointment with Dr  Hampton Abbot next week. She was provided our contact information and encouraged to call.   - The patient and her daughter were understanding of the above and agreeable. All questions and concerns were addressed.   Above history, examination, results, and plan of care were discussed with Dr Hampton Abbot.   Thank you for the opportunity to participate in this patient's care.   -- Edison Simon, PA-C [facility] 08/15/2019, 2:52 PM [phone] M-F: 7am - 4pm

## 2019-08-16 ENCOUNTER — Other Ambulatory Visit: Payer: Self-pay

## 2019-08-16 ENCOUNTER — Encounter: Payer: Self-pay | Admitting: Internal Medicine

## 2019-08-16 ENCOUNTER — Ambulatory Visit (INDEPENDENT_AMBULATORY_CARE_PROVIDER_SITE_OTHER): Payer: Medicare HMO | Admitting: Internal Medicine

## 2019-08-16 DIAGNOSIS — E1159 Type 2 diabetes mellitus with other circulatory complications: Secondary | ICD-10-CM

## 2019-08-16 DIAGNOSIS — K8050 Calculus of bile duct without cholangitis or cholecystitis without obstruction: Secondary | ICD-10-CM

## 2019-08-16 DIAGNOSIS — J4520 Mild intermittent asthma, uncomplicated: Secondary | ICD-10-CM | POA: Diagnosis not present

## 2019-08-16 DIAGNOSIS — I10 Essential (primary) hypertension: Secondary | ICD-10-CM

## 2019-08-16 DIAGNOSIS — K819 Cholecystitis, unspecified: Secondary | ICD-10-CM

## 2019-08-16 NOTE — Progress Notes (Signed)
[name], female   DOB: 07-31-1933, 85 y.o.   MRN: [id number]   Virtual Visit via telephone Note  This visit type was conducted due to national recommendations for restrictions regarding the COVID-19 pandemic (e.g. social distancing).  This format is felt to be most appropriate for this patient at this time.  All issues noted in this document were discussed and addressed.  No physical exam was performed (except for noted visual exam findings with Video Visits).   I connected with [name] by telephone and verified that I am speaking with the correct person using two identifiers. Location patient: home Location provider: work or home office Persons participating in the virtual visit: patient, provider  I discussed the limitations, risks, security and privacy concerns of performing an evaluation and management service by telephone and the availability of in person appointments. The patient expressed understanding and agreed to proceed.   Reason for visit: hospital follow up.    HPI: She was admitted 08/04/19 - 08/09/19 with abdominal pain and emesis.  Ultrasound of her right upper quadrant revealed thickened gallbladder c/w cholecystitis.  CBD dilated.  MRCP - choledocholithiasis.   IV abx.  Surgery evaluated.  ERCP - biliary sphincterotomy.  abx tailored based on culture results.  Symptoms improved.  No surgical intervention recommended at that time.  Discharged home.  Had increased pain 08/15/19 - back to ER. Note reviewed.  Surgery consulted and elected to follow.  LFTs and lipase - normal.  She is eating now.  Appetite is improving.  No vomiting.  No fever.  No chest pain.  Breathing stable.  No abdominal pain now.  Bowels moving. Has f/u planned with surgery - 08/23/19.     ROS: See pertinent positives and negatives per HPI.  Past Medical History:  Diagnosis Date  . Aortic insufficiency    a. TTE 12/19: EF 55-60%, probable HK of the mid apical anterior septal  myocardium, Gr1DD, mild AI, mildly dilated LA  . Asthma   . CAD (coronary artery disease)    a. NSTEMI 12/19; b. LHC 10/08/18: LM minimal luminal irregs, mLAD-1 95% s/p PCI/DES, mLAD-2 60%, LCx mild diffuse disease throughout, RCA minimal luminal irregs  . Diabetes mellitus ([place])   . Hypercholesterolemia   . Hypertension   . Osteopenia   . Polymyalgia rheumatica syndrome ([place])   . Reactive airway disease     Past Surgical History:  Procedure Laterality Date  . ABDOMINAL HYSTERECTOMY  1981   prolapse and bleeding, ovaries not removed  . BREAST EXCISIONAL BIOPSY Right   . CORONARY STENT INTERVENTION N/A 10/08/2018   Procedure: CORONARY STENT INTERVENTION;  Surgeon: Wellington Hampshire, MD;  Location: [place] [facility] [facility];  Service: Cardiovascular;  Laterality: N/A;  . ENDOSCOPIC RETROGRADE CHOLANGIOPANCREATOGRAPHY (ERCP) WITH PROPOFOL N/A 08/08/2019   Procedure: ENDOSCOPIC RETROGRADE CHOLANGIOPANCREATOGRAPHY (ERCP) WITH PROPOFOL;  Surgeon: Lucilla Lame, MD;  Location: [facility];  Service: Endoscopy;  Laterality: N/A;  . LEFT HEART CATH AND CORONARY ANGIOGRAPHY N/A 10/08/2018   Procedure: LEFT HEART CATH AND CORONARY ANGIOGRAPHY;  Surgeon: Wellington Hampshire, MD;  Location: [place];  Service: Cardiovascular;  Laterality: N/A;  . UMBILICAL HERNIA REPAIR  7/94    Family History  Problem Relation Age of Onset  . Heart attack Father   . Arthritis Mother   . Heart disease Mother   . Throat cancer Sister   . Parkinson's disease Sister   . COPD Brother     SOCIAL HX: reviewed.  Current Outpatient Medications:  .  ADVAIR DISKUS 250-50 MCG/DOSE AEPB, INHALE ONE PUFF BY MOUTH EVERY 12 HOURS. RINSE MOUTH AFTER EACH USE (Patient taking differently: Inhale 1 puff into the lungs 2 (two) times daily. ), Disp: 60 each, Rfl: 5 .  alendronate (FOSAMAX) 70 MG tablet, Take 70 mg by mouth once a week. , Disp: , Rfl:  .  aspirin 81 MG tablet, Take 81 mg by mouth daily., Disp: , Rfl:  .   atorvastatin (LIPITOR) 40 MG tablet, Take 1 tablet (40 mg total) by mouth daily at 6 PM., Disp: 90 tablet, Rfl: 3 .  clopidogrel (PLAVIX) 75 MG tablet, Take 1 tablet (75 mg total) by mouth daily with breakfast., Disp: 90 tablet, Rfl: 3 .  cyanocobalamin (,VITAMIN B-12,) 1000 MCG/ML injection, Inject 1,000 mcg into the muscle every 30 (thirty) days. , Disp: , Rfl:  .  fluticasone (FLONASE) 50 MCG/ACT nasal spray, USE 2 SPRAY(S) IN EACH NOSTRIL ONCE DAILY (Patient taking differently: Place 1 spray into both nostrils daily. ), Disp: 16 g, Rfl: 3 .  lisinopril (ZESTRIL) 20 MG tablet, Take 1 tablet (20 mg total) by mouth daily., Disp: 90 tablet, Rfl: 3 .  metFORMIN (GLUCOPHAGE) 500 MG tablet, TAKE 1 TABLET BY MOUTH TWICE DAILY WITH A MEAL (Patient taking differently: Take 500 mg by mouth 2 (two) times daily with a meal. ), Disp: 180 tablet, Rfl: 1 .  metoprolol tartrate (LOPRESSOR) 25 MG tablet, Take 1 tablet (25 mg total) by mouth 2 (two) times daily. (Patient taking differently: Take 12.5 mg by mouth 2 (two) times daily. ), Disp: 90 tablet, Rfl: 3 .  Multiple Vitamins-Minerals (VISION FORMULA/LUTEIN PO), Take by mouth daily., Disp: , Rfl:  .  nitroGLYCERIN (NITROSTAT) 0.4 MG SL tablet, Place 1 tablet (0.4 mg total) under the tongue every 5 (five) minutes as needed for chest pain., Disp: 30 tablet, Rfl: 12 .  PROAIR HFA 108 (90 Base) MCG/ACT inhaler, Inhale 2 puffs into the lungs every 6 (six) hours as needed. (Patient taking differently: Inhale 2 puffs into the lungs every 6 (six) hours as needed for wheezing or shortness of breath. ), Disp: 18 g, Rfl: 2 .  Vitamin D, Ergocalciferol, (DRISDOL) 50000 units CAPS capsule, Take 50,000 Units by mouth once a week., Disp: , Rfl: 3  EXAM:  VITALS per patient if applicable:  834/19, 5'1" , 120lbs.   GENERAL: alert.  Sounds to be in no acute distress.  Answering questions appropriately.    PSYCH/NEURO: pleasant and cooperative, no obvious depression or  anxiety, speech and thought processing grossly intact  ASSESSMENT AND PLAN:  Discussed the following assessment and plan:  Cholecystitis Recently admitted and found to have thickened gallbladder and choledocholithiasis.  Surgery evaluated.  Held on any intervention.  Treated with abx.  Has f/u planned with surgery 08/23/19.  Appetite improved.  She is feeling better.  Keep f/u with surgery.    Choledocholithiasis Recently admitted as outlined.  Treated with abx.  Surgery and GI evaluation.  S/p MRCP and ERCP as outlined.  Feeling better.  Keep f/u with surgery.    Diabetes mellitus with cardiac complication (HCC) Low carb diet and exercise.   Follow met b and a1c.  Hypertension Blood pressure under good control.  Continue same medication regimen.  Follow pressures.  Follow metabolic panel.    Reactive airway disease Breathing stable.      I discussed the assessment and treatment plan with the patient. The patient was provided an opportunity to  ask questions and all were answered. The patient agreed with the plan and demonstrated an understanding of the instructions.   The patient was advised to call back or seek an in-person evaluation if the symptoms worsen or if the condition fails to improve as anticipated.  I provided 15 minutes of non-face-to-face time during this encounter.   Einar Pheasant, MD

## 2019-08-20 NOTE — Progress Notes (Signed)
Cardiology Office Note    Date:  08/23/2019   ID:  [place] 1933-06-03, MRN [id number]  PCP:  Einar Pheasant, MD  Cardiologist:  Kathlyn Sacramento, MD  Electrophysiologist:  None   Chief Complaint: Preoperative cardiac evaluation  History of Present Illness:   Tanya Cole is a 85 y.o. female with history of CAD with a NSTEMI in 09/2018 s/p PCI/DES to the LAD, DM2, HTN, HLD, PMR, and osteopenia who presents for preoperative cardiac evaluation.   She was admitted in 09/2018 with a NSTEMI. LHC showed 95% mid LAD stenosis along with 60% distal LAD stenosis. She underwent successful PCI/DES to the mid LAD. Echo during that admission showed an EF of 55-60%, probable hypokinesis of the mid apical anterior septal myocardium, Gr1DD, mild AI, mildly dilated left atrium. She was admitted in 12/2018 with chest pain and ruled out. She underwent Lexiscan Myoview on 01/02/2019 which showed no evidence of ischemia with a normal EF.  She was seen virtually on 04/15/2019 and was doing well from a cardiac perspective.  More recently, she was admitted to the hospital in early 08/2019 with choledocholithiasis status post ERCP (with brief interruption of Plavix) with stone removal complicated by gram-negative bacteremia and UTI.  The plan was to wait until she was at least 12 months out from her MI prior to undergoing cholecystectomy.  However she had return of abdominal pain with several episodes of nonbloody emesis and was seen in the ED on 08/15/2019.  High-sensitivity troponin negative, lipase improved and normal, leukocytosis of 18,000, hemoglobin 12.3, platelet count 419, potassium 4.2, serum creatinine 0.74, improved and normal AST/ALT, improved alkaline phosphatase.  Right upper quadrant abdominal ultrasound showed cholelithiasis with mild gallbladder wall thickening with findings being equivocal for acute cholecystitis.  She subsequently followed up with general surgery earlier today,  08/23/2019.  Her surgeon has been in contact with Dr. Fletcher Anon regarding the need for possible surgical intervention prior to the patient completing 12 months of dual antiplatelet therapy.  It was felt the patient could discontinue Plavix prior to surgery though cardiology preference was to maintain continuation of aspirin given the risk of potential stent thrombosis.  That said, there is a potential significant bleeding risk with aspirin on board.  In visiting with her surgeon earlier today, it was ultimately decided to proceed with the surgery with aspirin on board given the risk of stent thrombosis and potential postsurgical issues thereafter.  She is scheduled to undergo a laparoscopic cholecystectomy on 09/02/2019.  She comes in accompanied by her daughter today.  She is doing well from a cardiac perspective and denies any chest pain, SOB, palpitations, dizziness, presyncope, or syncope.  No lower extremity swelling, abdominal distension, orthopnea, PND, or early satiety.  No current abdominal pain. Tolerating all cardiac medications without issues.  Outside of her Plavix being held while admitted for ERCP as above, she has not missed any doses of DAPT. No falls, BRBPR, or melena.  Continues to live on her own performing ADLs independently without cardiac limitation.   METs: > 4  Revised Cardiac Index: Moderate risk for noncardiac surgery with a 6.6% estimated rate of MI, pulmonary edema, VF, cardiac arrest, or complete heart block.  Past Medical History:  Diagnosis Date   Aortic insufficiency    a. TTE 12/19: EF 55-60%, probable HK of the mid apical anterior septal myocardium, Gr1DD, mild AI, mildly dilated LA   Asthma    CAD (coronary artery disease)    a. NSTEMI 12/19;  b. LHC 10/08/18: LM minimal luminal irregs, mLAD-1 95% s/p PCI/DES, mLAD-2 60%, LCx mild diffuse disease throughout, RCA minimal luminal irregs   Diabetes mellitus (HCC)    Hypercholesterolemia    Hypertension     Osteopenia    Polymyalgia rheumatica syndrome (HCC)    Reactive airway disease     Past Surgical History:  Procedure Laterality Date   ABDOMINAL HYSTERECTOMY  1981   prolapse and bleeding, ovaries not removed   BREAST EXCISIONAL BIOPSY Right    CORONARY STENT INTERVENTION N/A 10/08/2018   Procedure: CORONARY STENT INTERVENTION;  Surgeon: Wellington Hampshire, MD;  Location: [place] [facility];  Service: Cardiovascular;  Laterality: N/A;   ENDOSCOPIC RETROGRADE CHOLANGIOPANCREATOGRAPHY (ERCP) WITH PROPOFOL N/A 08/08/2019   Procedure: ENDOSCOPIC RETROGRADE CHOLANGIOPANCREATOGRAPHY (ERCP) WITH PROPOFOL;  Surgeon: Lucilla Lame, MD;  Location: [facility];  Service: Endoscopy;  Laterality: N/A;   LEFT HEART CATH AND CORONARY ANGIOGRAPHY N/A 10/08/2018   Procedure: LEFT HEART CATH AND CORONARY ANGIOGRAPHY;  Surgeon: Wellington Hampshire, MD;  Location: [facility];  Service: Cardiovascular;  Laterality: N/A;   UMBILICAL HERNIA REPAIR  7/94    Current Medications: Current Meds  Medication Sig   ADVAIR DISKUS 250-50 MCG/DOSE AEPB INHALE ONE PUFF BY MOUTH EVERY 12 HOURS. RINSE MOUTH AFTER EACH USE (Patient taking differently: Inhale 1 puff into the lungs 2 (two) times daily. )   alendronate (FOSAMAX) 70 MG tablet Take 70 mg by mouth once a week.    aspirin 81 MG tablet Take 81 mg by mouth daily.   atorvastatin (LIPITOR) 40 MG tablet Take 1 tablet (40 mg total) by mouth daily at 6 PM.   clopidogrel (PLAVIX) 75 MG tablet Take 1 tablet (75 mg total) by mouth daily with breakfast.   cyanocobalamin (,VITAMIN B-12,) 1000 MCG/ML injection Inject 1,000 mcg into the muscle every 30 (thirty) days.    fluticasone (FLONASE) 50 MCG/ACT nasal spray USE 2 SPRAY(S) IN EACH NOSTRIL ONCE DAILY (Patient taking differently: Place 1 spray into both nostrils daily. )   lisinopril (ZESTRIL) 20 MG tablet Take 1 tablet (20 mg total) by mouth daily.   metFORMIN (GLUCOPHAGE) 500 MG tablet TAKE 1  TABLET BY MOUTH TWICE DAILY WITH A MEAL (Patient taking differently: Take 500 mg by mouth 2 (two) times daily with a meal. )   metoprolol tartrate (LOPRESSOR) 25 MG tablet Take 1 tablet (25 mg total) by mouth 2 (two) times daily. (Patient taking differently: Take 12.5 mg by mouth 2 (two) times daily. )   Multiple Vitamins-Minerals (VISION FORMULA/LUTEIN PO) Take by mouth daily.   nitroGLYCERIN (NITROSTAT) 0.4 MG SL tablet Place 1 tablet (0.4 mg total) under the tongue every 5 (five) minutes as needed for chest pain.   PROAIR HFA 108 (90 Base) MCG/ACT inhaler Inhale 2 puffs into the lungs every 6 (six) hours as needed. (Patient taking differently: Inhale 2 puffs into the lungs every 6 (six) hours as needed for wheezing or shortness of breath. )   Vitamin D, Ergocalciferol, (DRISDOL) 50000 units CAPS capsule Take 50,000 Units by mouth once a week.    Allergies:   Tramadol   Social History   Socioeconomic History   Marital status: Widowed    Spouse name: Not on file   Number of children: 3   Years of education: Not on file   Highest education level: Not on file  Occupational History   Not on file  Social Needs   Financial resource strain: Not hard at all  Food insecurity    Worry: Never true    Inability: Never true   Transportation needs    Medical: No    Non-medical: No  Tobacco Use   Smoking status: Never Smoker   Smokeless tobacco: Never Used  Substance and Sexual Activity   Alcohol use: No    Alcohol/week: 0.0 standard drinks   Drug use: No   Sexual activity: Never  Lifestyle   Physical activity    Days per week: 2 days    Minutes per session: 10 min   Stress: Not at all  Relationships   Social connections    Talks on phone: Not on file    Gets together: Not on file    Attends religious service: Not on file    Active member of club or organization: Not on file    Attends meetings of clubs or organizations: Not on file    Relationship status: Not  on file  Other Topics Concern   Not on file  Social History Narrative   Not on file     Family History:  The patient's family history includes Arthritis in her mother; COPD in her brother; Heart attack in her father; Heart disease in her mother; Parkinson's disease in her sister; Throat cancer in her sister.  ROS:   Review of Systems  Constitutional: Negative for chills, diaphoresis, fever, malaise/fatigue and weight loss.  HENT: Negative for congestion.   Eyes: Negative for discharge and redness.  Respiratory: Negative for cough, hemoptysis, sputum production, shortness of breath and wheezing.   Cardiovascular: Negative for chest pain, palpitations, orthopnea, claudication, leg swelling and PND.  Gastrointestinal: Negative for abdominal pain, blood in stool, heartburn, melena, nausea and vomiting.  Genitourinary: Negative for hematuria.  Musculoskeletal: Negative for falls and myalgias.  Skin: Negative for rash.  Neurological: Negative for dizziness, tingling, tremors, sensory change, speech change, focal weakness, loss of consciousness and weakness.  Endo/Heme/Allergies: Does not bruise/bleed easily.  Psychiatric/Behavioral: Negative for substance abuse. The patient is not nervous/anxious.   All other systems reviewed and are negative.    EKGs/Labs/Other Studies Reviewed:    Studies reviewed were summarized above. The additional studies were reviewed today: As above.   EKG:  EKG is ordered today.  The EKG ordered today demonstrates NSR with sinus arrhythmia, 87 bpm, normal axis, nonspecific inferior ST-T changes, largely unchanged from prior  Recent Labs: 10/18/2018: Magnesium 1.9 08/04/2019: TSH 2.326 08/15/2019: ALT 31; BUN 18; Creatinine, Ser 0.74; Hemoglobin 12.3; Platelets 419; Potassium 4.2; Sodium 140  Recent Lipid Panel    Component Value Date/Time   CHOL 157 05/24/2019 0809   TRIG 140.0 05/24/2019 0809   HDL 45.60 05/24/2019 0809   CHOLHDL 3 05/24/2019 0809    VLDL 28.0 05/24/2019 0809   LDLCALC 84 05/24/2019 0809   LDLDIRECT 85.0 06/15/2017 0815    PHYSICAL EXAM:    VS:  BP 110/68 (BP Location: Left Arm, Patient Position: Sitting, Cuff Size: Normal)    Pulse 88    Temp 98 F (36.7 C)    Ht _0  (1.549 m)    Wt 115 lb (52.2 kg)    BMI 21.73 kg/m   BMI: Body mass index is 21.73 kg/m.  Physical Exam  Constitutional: She is oriented to person, place, and time. She appears well-developed and well-nourished.  HENT:  Head: Normocephalic and atraumatic.  Eyes: Right eye exhibits no discharge. Left eye exhibits no discharge.  Neck: Normal range of motion. No JVD present.  Cardiovascular: Normal rate,  regular rhythm, S1 normal, S2 normal and normal heart sounds. Exam reveals no distant heart sounds, no friction rub, no midsystolic click and no opening snap.  No murmur heard. Pulses:      Posterior tibial pulses are 2+ on the right side and 2+ on the left side.  Pulmonary/Chest: Effort normal and breath sounds normal. No respiratory distress. She has no decreased breath sounds. She has no wheezes. She has no rales. She exhibits no tenderness.  Abdominal: Soft. She exhibits no distension. There is no abdominal tenderness.  Musculoskeletal:        General: No edema.  Neurological: She is alert and oriented to person, place, and time.  Skin: Skin is warm and dry. No cyanosis. Nails show no clubbing.  Psychiatric: She has a normal mood and affect. Her speech is normal and behavior is normal. Judgment and thought content normal.    Wt Readings from Last 3 Encounters:  08/23/19 115 lb (52.2 kg)  08/23/19 114 lb 6.4 oz (51.9 kg)  08/22/19 114 lb (51.7 kg)     ASSESSMENT & PLAN:   1. Preoperative cardiac risk evaluation: She is doing well from a cardiac perspective.  Originally, the plan was for surgical intervention of her gallbladder to be deferred until after she had completed 12 months of DAPT.  However, she has had recurrence of symptoms  following ERCP with imaging consistent with acute cholecystitis.  In this setting, surgical intervention has been indicated.  Case has been discussed among Drs. Arida and Piscoya, as well as Dr. Fletcher Anon and myself regarding her DAPT.  She previously had Plavix held without issue for ERCP and is nearly 12 months out from her NSTEMI with PCI to the LAD.  Per Dr. Fletcher Anon (discussed over the phone today), ok to hold Plavix 5-7 days prior to surgery.  Would ideally like to maintain the patient on ASA 81 mg throughout the perioperative timeframe, unless the bleeding risk is felt to be too great by the surgical team.  After the patient met with her surgeon earlier today, the decision was made to hold Plavix starting on 10/28 (last dose 10/27) for surgery on 11/2.  She will continue ASA 81 mg daily throughout.  Resumption of Plavix is deferred to the surgical service to be restarting when felt to be safe.  She is able to proceed at moderate risk for noncardiac surgery as outlined above without any further cardiac testing required at this time given lack of anginal complaints.  2. CAD involving the native coronary arteries: She is doing well without any complaints consistent with angina.  Continue DAPT, with appropriate interruption of Plavix, as outlined above. Continue Lipitor, metoprolol, lisinopril, and prn nitro.  No plans for ischemic evaluation at this time.   3. HTN: BP is well controlled. Continue current therapy.   4. HLD: Most recent LDL of 84. Goal LDL < 70. Continue Lipitor with rechecking of lipid panel in follow up.   Disposition: F/u with Dr. Fletcher Anon or an APP in 3 months.   Medication Adjustments/Labs and Tests Ordered: Current medicines are reviewed at length with the patient today.  Concerns regarding medicines are outlined above. Medication changes, Labs and Tests ordered today are summarized above and listed in the Patient Instructions accessible in Encounters.   Signed, Christell Faith,  PA-C 08/23/2019 3:23 PM     [facility] [address] [place], [place] [address] [phone]

## 2019-08-21 ENCOUNTER — Encounter: Payer: Self-pay | Admitting: Internal Medicine

## 2019-08-21 NOTE — Assessment & Plan Note (Signed)
Low carb diet and exercise.  Follow met b and a1c.   

## 2019-08-21 NOTE — Assessment & Plan Note (Signed)
Breathing stable.

## 2019-08-21 NOTE — Assessment & Plan Note (Signed)
Recently admitted as outlined.  Treated with abx.  Surgery and GI evaluation.  S/p MRCP and ERCP as outlined.  Feeling better.  Keep f/u with surgery.

## 2019-08-21 NOTE — Assessment & Plan Note (Signed)
Recently admitted and found to have thickened gallbladder and choledocholithiasis.  Surgery evaluated.  Held on any intervention.  Treated with abx.  Has f/u planned with surgery 08/23/19.  Appetite improved.  She is feeling better.  Keep f/u with surgery.

## 2019-08-21 NOTE — Assessment & Plan Note (Signed)
Blood pressure under good control.  Continue same medication regimen.  Follow pressures.  Follow metabolic panel.   

## 2019-08-22 ENCOUNTER — Encounter: Payer: Self-pay | Admitting: Gastroenterology

## 2019-08-22 ENCOUNTER — Other Ambulatory Visit: Payer: Self-pay

## 2019-08-22 ENCOUNTER — Ambulatory Visit (INDEPENDENT_AMBULATORY_CARE_PROVIDER_SITE_OTHER): Payer: Medicare HMO | Admitting: Gastroenterology

## 2019-08-22 VITALS — BP 129/76 | HR 94 | Temp 98.4°F | Ht 61.0 in | Wt 114.0 lb

## 2019-08-22 DIAGNOSIS — K8050 Calculus of bile duct without cholangitis or cholecystitis without obstruction: Secondary | ICD-10-CM | POA: Diagnosis not present

## 2019-08-22 DIAGNOSIS — R7989 Other specified abnormal findings of blood chemistry: Secondary | ICD-10-CM

## 2019-08-22 DIAGNOSIS — R945 Abnormal results of liver function studies: Secondary | ICD-10-CM

## 2019-08-22 NOTE — Progress Notes (Signed)
Wyline Mood MD, MRCP(U.K) [address]  [address]  [place], [place] [address]  Main: [phone]  Fax: [phone]   Primary Care Physician: Dale Bri[place], MD  Primary Gastroenterologist:  Dr. Wyline Mood   No chief complaint on file.   HPI: Tanya Cole is a 85 y.o. female is here today to see me after hospital follow-up and she was admitted in October 2020 with abdominal pain and found to have choledocholithiasis with gallstone pancreatitis.  Underwent an ERCP.  She has been seen and evaluated by surgery and they are not going to perform a cholecystectomy at this point of time since she has had a stent and placed on Plavix and needs to be on it for at least 1 year before she can have discontinuation.  Since discharge no issues she is doing well.  No abdominal pain.  She has an appointment with her cardiologist tomorrow to discuss about probability of surgery.  Current Outpatient Medications  Medication Sig Dispense Refill   ADVAIR DISKUS 250-50 MCG/DOSE AEPB INHALE ONE PUFF BY MOUTH EVERY 12 HOURS. RINSE MOUTH AFTER EACH USE (Patient taking differently: Inhale 1 puff into the lungs 2 (two) times daily. ) 60 each 5   alendronate (FOSAMAX) 70 MG tablet Take 70 mg by mouth once a week.      aspirin 81 MG tablet Take 81 mg by mouth daily.     atorvastatin (LIPITOR) 40 MG tablet Take 1 tablet (40 mg total) by mouth daily at 6 PM. 90 tablet 3   clopidogrel (PLAVIX) 75 MG tablet Take 1 tablet (75 mg total) by mouth daily with breakfast. 90 tablet 3   cyanocobalamin (,VITAMIN B-12,) 1000 MCG/ML injection Inject 1,000 mcg into the muscle every 30 (thirty) days.      fluticasone (FLONASE) 50 MCG/ACT nasal spray USE 2 SPRAY(S) IN EACH NOSTRIL ONCE DAILY (Patient taking differently: Place 1 spray into both nostrils daily. ) 16 g 3   lisinopril (ZESTRIL) 20 MG tablet Take 1 tablet (20 mg total) by mouth daily. 90 tablet 3   metFORMIN (GLUCOPHAGE) 500 MG tablet TAKE 1  TABLET BY MOUTH TWICE DAILY WITH A MEAL (Patient taking differently: Take 500 mg by mouth 2 (two) times daily with a meal. ) 180 tablet 1   metoprolol tartrate (LOPRESSOR) 25 MG tablet Take 1 tablet (25 mg total) by mouth 2 (two) times daily. (Patient taking differently: Take 12.5 mg by mouth 2 (two) times daily. ) 90 tablet 3   Multiple Vitamins-Minerals (VISION FORMULA/LUTEIN PO) Take by mouth daily.     nitroGLYCERIN (NITROSTAT) 0.4 MG SL tablet Place 1 tablet (0.4 mg total) under the tongue every 5 (five) minutes as needed for chest pain. 30 tablet 12   PROAIR HFA 108 (90 Base) MCG/ACT inhaler Inhale 2 puffs into the lungs every 6 (six) hours as needed. (Patient taking differently: Inhale 2 puffs into the lungs every 6 (six) hours as needed for wheezing or shortness of breath. ) 18 g 2   Vitamin D, Ergocalciferol, (DRISDOL) 50000 units CAPS capsule Take 50,000 Units by mouth once a week.  3   No current facility-administered medications for this visit.     Allergies as of 08/22/2019 - Review Complete 08/21/2019  Allergen Reaction Noted   Tramadol Itching and Nausea And Vomiting 04/29/2016    ROS:  General: Negative for anorexia, weight loss, fever, chills, fatigue, weakness. ENT: Negative for hoarseness, difficulty swallowing , nasal congestion. CV: Negative for chest pain, angina, palpitations,  dyspnea on exertion, peripheral edema.  Respiratory: Negative for dyspnea at rest, dyspnea on exertion, cough, sputum, wheezing.  GI: See history of present illness. GU:  Negative for dysuria, hematuria, urinary incontinence, urinary frequency, nocturnal urination.  Endo: Negative for unusual weight change.    Physical Examination:   There were no vitals taken for this visit.  General: Well-nourished, well-developed in no acute distress.  Eyes: No icterus. Conjunctivae pink. Mouth: Oropharyngeal mucosa moist and pink , no lesions erythema or exudate. Lungs: Clear to auscultation  bilaterally. Non-labored. Heart: Regular rate and rhythm, no murmurs rubs or gallops.  Abdomen: Bowel sounds are normal, nontender, nondistended, no hepatosplenomegaly or masses, no abdominal bruits or hernia , no rebound or guarding.   Extremities: No lower extremity edema. No clubbing or deformities. Neuro: Alert and oriented x 3.  Grossly intact. Skin: Warm and dry, no jaundice.   Psych: Alert and cooperative, normal mood and affect.   Imaging Studies: Dg Chest 2 View  Result Date: 08/04/2019 CLINICAL DATA:  Fever nausea vomiting EXAM: CHEST - 2 VIEW COMPARISON:  12/14/2018 FINDINGS: Linear scarring or atelectasis at the left base. No focal consolidation or pleural effusion. Stable cardiomediastinal silhouette with aortic atherosclerosis. No pneumothorax. IMPRESSION: No active cardiopulmonary disease. Mild scarring or atelectasis at the left lung base. Electronically Signed   By: Donavan Foil M.D.   On: 08/04/2019 21:22   Mr 3d Recon At Scanner  Result Date: 08/05/2019 CLINICAL DATA:  Right upper quadrant pain.  Cholecystitis suspected. EXAM: MRI ABDOMEN WITHOUT AND WITH CONTRAST (INCLUDING MRCP) TECHNIQUE: Multiplanar multisequence MR imaging of the abdomen was performed both before and after the administration of intravenous contrast. Heavily T2-weighted images of the biliary and pancreatic ducts were obtained, and three-dimensional MRCP images were rendered by post processing. CONTRAST:  41mL GADAVIST GADOBUTROL 1 MMOL/ML IV SOLN COMPARISON:  Ultrasound exam 08/04/2019.  Abdominal CT 10/06/2016. FINDINGS: Lower chest: Unremarkable. Hepatobiliary: No suspicious focal abnormality within the liver parenchyma. Gallbladder is distended. Multiple layering tiny gallstones evident measuring up to 4-5 mm on a background of layering gallbladder sludge. Common duct is nondilated. Common bile duct in the head of the pancreas measures 5 mm, within normal limits. 3 mm stone is identified in the distal common  bile duct (well seen on coronal MRCP image 25/15 and axial T2 haste image 24/4). There may possibly be another stone directly adjacent to the ampulla. Pancreas: Multiple tiny cystic lesions are noted in the tail of pancreas measuring 3-4 mm and 1 of these appears to communicate with the main duct on axial T2 haste 19/4. Spleen: No splenomegaly. Small cyst or infarct noted in the subcapsular parenchyma. Adrenals/Urinary Tract: No adrenal nodule or mass. Tiny cortical lesions in both kidneys are too small to characterize but likely represent tiny cysts. No hydronephrosis. Stomach/Bowel: Stomach is unremarkable. No gastric wall thickening. No evidence of outlet obstruction. Duodenum is normally positioned as is the ligament of Treitz. No small bowel or colonic dilatation within the visualized abdomen. Advanced diverticular disease noted in the abdominal segments of the colon. Vascular/Lymphatic: No abdominal aortic aneurysm. Portal vein, superior mesenteric vein, and splenic vein are patent. No abdominal lymphadenopathy. Other:  No intraperitoneal free fluid. Musculoskeletal: No abnormal marrow enhancement within the visualized bony anatomy. IMPRESSION: 1. Distended gallbladder with layering tiny stones and sludge. No substantial gallbladder wall thickening or pericholecystic fluid by MRI. Common bile duct measures up to 5 mm with 3 mm distal common bile duct stone clearly visualized. There may be a  second tiny stone immediately adjacent to the ampulla. 2. Tiny cystic foci in the tail of the pancreas, 1 of which may communicate with the main pancreatic duct. For cystic lesions of this size in a patient of this age, consensus criteria recommend repeat imaging in 2 years. This recommendation follows ACR consensus guidelines: Management of Incidental Pancreatic Cysts: A White Paper of the ACR Incidental Findings Committee. J Am Coll Radiol 2017;14:911-923. 3. Colonic diverticulosis. Electronically Signed   By: Kennith CenterEric   Mansell M.D.   On: 08/05/2019 12:28   Dg C-arm 1-60 Min-no Report  Result Date: 08/08/2019 Fluoroscopy was utilized by the requesting physician.  No radiographic interpretation.   Mr Abdomen Mrcp Vivien RossettiW Wo Contast  Result Date: 08/05/2019 CLINICAL DATA:  Right upper quadrant pain.  Cholecystitis suspected. EXAM: MRI ABDOMEN WITHOUT AND WITH CONTRAST (INCLUDING MRCP) TECHNIQUE: Multiplanar multisequence MR imaging of the abdomen was performed both before and after the administration of intravenous contrast. Heavily T2-weighted images of the biliary and pancreatic ducts were obtained, and three-dimensional MRCP images were rendered by post processing. CONTRAST:  5mL GADAVIST GADOBUTROL 1 MMOL/ML IV SOLN COMPARISON:  Ultrasound exam 08/04/2019.  Abdominal CT 10/06/2016. FINDINGS: Lower chest: Unremarkable. Hepatobiliary: No suspicious focal abnormality within the liver parenchyma. Gallbladder is distended. Multiple layering tiny gallstones evident measuring up to 4-5 mm on a background of layering gallbladder sludge. Common duct is nondilated. Common bile duct in the head of the pancreas measures 5 mm, within normal limits. 3 mm stone is identified in the distal common bile duct (well seen on coronal MRCP image 25/15 and axial T2 haste image 24/4). There may possibly be another stone directly adjacent to the ampulla. Pancreas: Multiple tiny cystic lesions are noted in the tail of pancreas measuring 3-4 mm and 1 of these appears to communicate with the main duct on axial T2 haste 19/4. Spleen: No splenomegaly. Small cyst or infarct noted in the subcapsular parenchyma. Adrenals/Urinary Tract: No adrenal nodule or mass. Tiny cortical lesions in both kidneys are too small to characterize but likely represent tiny cysts. No hydronephrosis. Stomach/Bowel: Stomach is unremarkable. No gastric wall thickening. No evidence of outlet obstruction. Duodenum is normally positioned as is the ligament of Treitz. No small bowel or  colonic dilatation within the visualized abdomen. Advanced diverticular disease noted in the abdominal segments of the colon. Vascular/Lymphatic: No abdominal aortic aneurysm. Portal vein, superior mesenteric vein, and splenic vein are patent. No abdominal lymphadenopathy. Other:  No intraperitoneal free fluid. Musculoskeletal: No abnormal marrow enhancement within the visualized bony anatomy. IMPRESSION: 1. Distended gallbladder with layering tiny stones and sludge. No substantial gallbladder wall thickening or pericholecystic fluid by MRI. Common bile duct measures up to 5 mm with 3 mm distal common bile duct stone clearly visualized. There may be a second tiny stone immediately adjacent to the ampulla. 2. Tiny cystic foci in the tail of the pancreas, 1 of which may communicate with the main pancreatic duct. For cystic lesions of this size in a patient of this age, consensus criteria recommend repeat imaging in 2 years. This recommendation follows ACR consensus guidelines: Management of Incidental Pancreatic Cysts: A White Paper of the ACR Incidental Findings Committee. J Am Coll Radiol 2017;14:911-923. 3. Colonic diverticulosis. Electronically Signed   By: Kennith CenterEric  Mansell M.D.   On: 08/05/2019 12:28   [place] Abdomen Limited Ruq  Result Date: 08/15/2019 CLINICAL DATA:  Epigastric pain EXAM: ULTRASOUND ABDOMEN LIMITED RIGHT UPPER QUADRANT COMPARISON:  08/04/2019 FINDINGS: Gallbladder: Borderline gallbladder wall thickening at 3.5  mm. Multiple small layering stones within the gallbladder neck and body. No sonographic Murphy sign noted by sonographer. Common bile duct: Diameter: 6 mm Liver: No focal lesion identified. Within normal limits in parenchymal echogenicity. Portal vein is patent on color Doppler imaging with normal direction of blood flow towards the liver. Other: None. IMPRESSION: Cholelithiasis with mild gallbladder wall thickening. No sonographic Eulah PontMurphy sign was noted. Findings are equivocal for acute  cholecystitis. Consider nuclear medicine hepatobiliary scan if further imaging evaluation is warranted clinically. Electronically Signed   By: Duanne GuessNicholas  Plundo M.D.   On: 08/15/2019 13:44   [place] Abdomen Limited Ruq  Result Date: 08/04/2019 CLINICAL DATA:  Upper abdominal pain EXAM: ULTRASOUND ABDOMEN LIMITED RIGHT UPPER QUADRANT COMPARISON:  None. FINDINGS: Gallbladder: Fluid-filled and distended with layering sludge and small gallbladder stones. The gallbladder wall is mildly prominent measuring 3.4-4 mm. The patient does have a sonographic Murphy sign. Common bile duct: Diameter: 7 mm Liver: No focal lesion identified. Within normal limits in parenchymal echogenicity. Portal vein is patent on color Doppler imaging with normal direction of blood flow towards the liver. Other: None. IMPRESSION: Findings which could be suggestive of acute cholecystitis as described above. Electronically Signed   By: Jonna ClarkBindu  Avutu M.D.   On: 08/04/2019 23:28    Assessment and Plan:   Tanya Cole is a 85 y.o. y/o female admitted on 08/04/2019 with abdominal pain and elevated liver function test. Possible gall stone pancreatitis Found to have choledocholithiasis on MRCP and underwent ERCP on 08/08/2019.  Features of acute cholecystitis seen on admission.  LFTs checked on 08/15/2019 shows that the transaminases have completely normalized with alkaline phosphatase also trending down .  Cholecystectomy postponed for now follows with Dr. Aleen CampiPiscoya.  Plan is to reevaluate when she can come off Plavix which would be 1 year from the time she had undergone PCI.  Plan 1.  Check LFTs in 4 weeks to confirm normalization  2. Cholecystectomy as directed by surgery and cardiology  Dr Wyline MoodKiran Jefry Lesinski  MD,MRCP [facility](U.K) Follow up as needed

## 2019-08-23 ENCOUNTER — Encounter: Payer: Self-pay | Admitting: Surgery

## 2019-08-23 ENCOUNTER — Ambulatory Visit (INDEPENDENT_AMBULATORY_CARE_PROVIDER_SITE_OTHER): Payer: Medicare HMO | Admitting: Physician Assistant

## 2019-08-23 ENCOUNTER — Encounter: Payer: Self-pay | Admitting: Physician Assistant

## 2019-08-23 ENCOUNTER — Telehealth: Payer: Self-pay | Admitting: Cardiovascular Disease

## 2019-08-23 ENCOUNTER — Ambulatory Visit (INDEPENDENT_AMBULATORY_CARE_PROVIDER_SITE_OTHER): Payer: Medicare HMO | Admitting: Surgery

## 2019-08-23 VITALS — BP 110/68 | HR 88 | Temp 98.0°F | Ht 61.0 in | Wt 115.0 lb

## 2019-08-23 VITALS — BP 150/64 | HR 72 | Temp 97.5°F | Ht 61.0 in | Wt 114.4 lb

## 2019-08-23 DIAGNOSIS — K851 Biliary acute pancreatitis without necrosis or infection: Secondary | ICD-10-CM | POA: Diagnosis not present

## 2019-08-23 DIAGNOSIS — I2510 Atherosclerotic heart disease of native coronary artery without angina pectoris: Secondary | ICD-10-CM

## 2019-08-23 DIAGNOSIS — Z01810 Encounter for preprocedural cardiovascular examination: Secondary | ICD-10-CM

## 2019-08-23 DIAGNOSIS — E785 Hyperlipidemia, unspecified: Secondary | ICD-10-CM

## 2019-08-23 DIAGNOSIS — I10 Essential (primary) hypertension: Secondary | ICD-10-CM | POA: Diagnosis not present

## 2019-08-23 DIAGNOSIS — K8050 Calculus of bile duct without cholangitis or cholecystitis without obstruction: Secondary | ICD-10-CM

## 2019-08-23 NOTE — H&P (View-Only) (Signed)
08/23/2019  History of Present Illness: [name] is a 85 y.o. female with a history of gallstone pancreatitis and choledocholithiasis who was admitted on 08/04/2019 to the hospital.  She underwent an ERCP with Dr. Servando SnareWohl on 10/8.  She does have a history of coronary artery disease with an MI in 09/2018 which required drug-eluting stent placement.  She was on aspirin and Plavix and the Plavix was stopped briefly for the ERCP.  However given her history of MI I decided to defer surgery until she was 1 year out so we can safely stop her medications and will be less stress for her body.  She was discharged to home on 10/9 but presented again to the emergency room on 10/15 with another episode of biliary colic.  At that point her LFTs were normal except for an elevated alkaline phosphatase of 180.  However given that she is now had 2 episodes over the last month, I wanted to discuss with her about doing surgery sooner rather than later.  I have emailed Dr. Kirke CorinArida who is her cardiologist and he agreed that discontinuing Plavix was appropriate prior to surgery but would rather keep her on aspirin given the potential risk for stent thrombosis.  Given the potential risk of bleeding with even aspirin on board, having a discussion with the patient today about what she would like to do with her surgery.  Past Medical History: Past Medical History:  Diagnosis Date  . Aortic insufficiency    a. TTE 12/19: EF 55-60%, probable HK of the mid apical anterior septal myocardium, Gr1DD, mild AI, mildly dilated LA  . Asthma   . CAD (coronary artery disease)    a. NSTEMI 12/19; b. LHC 10/08/18: LM minimal luminal irregs, mLAD-1 95% s/p PCI/DES, mLAD-2 60%, LCx mild diffuse disease throughout, RCA minimal luminal irregs  . Diabetes mellitus (HCC)   . Hypercholesterolemia   . Hypertension   . Osteopenia   . Polymyalgia rheumatica syndrome (HCC)   . Reactive airway disease      Past Surgical History: Past  Surgical History:  Procedure Laterality Date  . ABDOMINAL HYSTERECTOMY  1981   prolapse and bleeding, ovaries not removed  . BREAST EXCISIONAL BIOPSY Right   . CORONARY STENT INTERVENTION N/A 10/08/2018   Procedure: CORONARY STENT INTERVENTION;  Surgeon: Iran OuchArida, Muhammad A, MD;  Location: [facility] INVASIVE CV LAB;  Service: Cardiovascular;  Laterality: N/A;  . ENDOSCOPIC RETROGRADE CHOLANGIOPANCREATOGRAPHY (ERCP) WITH PROPOFOL N/A 08/08/2019   Procedure: ENDOSCOPIC RETROGRADE CHOLANGIOPANCREATOGRAPHY (ERCP) WITH PROPOFOL;  Surgeon: Midge MiniumWohl, Darren, MD;  Location: [facility] ENDOSCOPY;  Service: Endoscopy;  Laterality: N/A;  . LEFT HEART CATH AND CORONARY ANGIOGRAPHY N/A 10/08/2018   Procedure: LEFT HEART CATH AND CORONARY ANGIOGRAPHY;  Surgeon: Iran OuchArida, Muhammad A, MD;  Location: [facility] INVASIVE CV LAB;  Service: Cardiovascular;  Laterality: N/A;  . UMBILICAL HERNIA REPAIR  7/94    Home Medications: Prior to Admission medications   Medication Sig Start Date End Date Taking? Authorizing Provider  ADVAIR DISKUS 250-50 MCG/DOSE AEPB INHALE ONE PUFF BY MOUTH EVERY 12 HOURS. RINSE MOUTH AFTER EACH USE Patient taking differently: Inhale 1 puff into the lungs 2 (two) times daily.  02/17/16  Yes Dale DurhamScott, Charlene, MD  alendronate (FOSAMAX) 70 MG tablet Take 70 mg by mouth once a week.  11/06/17  Yes [provider]  aspirin 81 MG tablet Take 81 mg by mouth daily.   Yes [provider]  atorvastatin (LIPITOR) 40 MG tablet Take 1 tablet (40 mg total) by  mouth daily at 6 PM. 10/18/18  Yes Dunn, Areta Haber, PA-C  clopidogrel (PLAVIX) 75 MG tablet Take 1 tablet (75 mg total) by mouth daily with breakfast. 10/18/18  Yes Dunn, Areta Haber, PA-C  cyanocobalamin (,VITAMIN B-12,) 1000 MCG/ML injection Inject 1,000 mcg into the muscle every 30 (thirty) days.    Yes [provider]  fluticasone (FLONASE) 50 MCG/ACT nasal spray USE 2 SPRAY(S) IN EACH NOSTRIL ONCE DAILY Patient taking differently: Place 1 spray into both  nostrils daily.  08/27/18  Yes Einar Pheasant, MD  lisinopril (ZESTRIL) 20 MG tablet Take 1 tablet (20 mg total) by mouth daily. 04/15/19  Yes Dunn, Areta Haber, PA-C  metFORMIN (GLUCOPHAGE) 500 MG tablet TAKE 1 TABLET BY MOUTH TWICE DAILY WITH A MEAL Patient taking differently: Take 500 mg by mouth 2 (two) times daily with a meal.  05/15/19  Yes Einar Pheasant, MD  metoprolol tartrate (LOPRESSOR) 25 MG tablet Take 1 tablet (25 mg total) by mouth 2 (two) times daily. Patient taking differently: Take 12.5 mg by mouth 2 (two) times daily.  11/27/18  Yes Dunn, Areta Haber, PA-C  Multiple Vitamins-Minerals (VISION FORMULA/LUTEIN PO) Take by mouth daily.   Yes [provider]  nitroGLYCERIN (NITROSTAT) 0.4 MG SL tablet Place 1 tablet (0.4 mg total) under the tongue every 5 (five) minutes as needed for chest pain. 10/09/18  Yes Bettey Costa, MD  PROAIR HFA 108 (90 Base) MCG/ACT inhaler Inhale 2 puffs into the lungs every 6 (six) hours as needed. Patient taking differently: Inhale 2 puffs into the lungs every 6 (six) hours as needed for wheezing or shortness of breath.  05/28/19  Yes Einar Pheasant, MD  Vitamin D, Ergocalciferol, (DRISDOL) 50000 units CAPS capsule Take 50,000 Units by mouth once a week. 06/24/18  Yes [provider]    Allergies: Allergies  Allergen Reactions  . Tramadol Itching and Nausea And Vomiting    Review of Systems: Review of Systems  Constitutional: Negative for chills and fever.  Respiratory: Negative for shortness of breath.   Cardiovascular: Negative for chest pain.  Gastrointestinal: Positive for abdominal pain and nausea. Negative for diarrhea and vomiting.    Physical Exam BP (!) 150/64   Pulse 72   Temp (!) 97.5 F (36.4 C) (Temporal)   Ht 5\' 1"  (1.549 m)   Wt 114 lb 6.4 oz (51.9 kg)   SpO2 96%   BMI 21.62 kg/m  CONSTITUTIONAL: No acute distress HEENT:  Normocephalic, atraumatic, extraocular motion intact. RESPIRATORY:  Lungs are clear, and breath  sounds are equal bilaterally. Normal respiratory effort without pathologic use of accessory muscles. CARDIOVASCULAR: Heart is regular without murmurs, gallops, or rubs. GI: The abdomen is soft, nondistended, with mild discomfort to palpation in the epigastric and right upper quadrant areas.  Negative Murphy's sign.  NEUROLOGIC:  Motor and sensation is grossly normal.  Cranial nerves are grossly intact. PSYCH:  Alert and oriented to person, place and time. Affect is normal.  Labs/Imaging: Labs on 08/15/2019: Sodium 140, potassium 4.2, chloride 102, CO2 25, BUN 18, creatinine 0.74.  Total bilirubin 1.0, AST 21, ALT 31, alkaline phosphatase 180.  Lipase 26.  White blood cell count 18, hemoglobin 12.3, hematocrit 37.7, platelet count 419.  Assessment and Plan: This is a 84 y.o. female with recent history of gallstone pancreatitis and choledocholithiasis status post ERCP.  -Discussed with the patient that given that she has had now 2 episodes over the past month, it would be worthwhile to proceed with surgery sooner  rather than later.  Discussed with her that I had emailed with Dr. Kirke Corin about potentially coming off her aspirin and Plavix.  Dr. Verline Lema mentioned that being that she is more than 6 months out from her stent placement, it would be appropriate to discontinue the Plavix.  However it would be ideal to keep her on aspirin given the risk of stent thrombosis if both medications are off.  Although ideally it would be better to do surgery with neither aspirin or Plavix, my concern is that with the risk of stent thrombosis, if anything like that were to happen postoperatively, she will also need to be reloaded on Plavix and potential interventions which could make any issues such as bleeding from the wound beds worse so I think it be better to deal with the bleeding risk upfront rather than afterwards.  I discussed this with the patient and she is in agreement and is willing to proceed this way. -Discussed  with the patient the role for laparoscopic cholecystectomy with intraoperative cholangiogram in order to remove the gallbladder and evaluate the common bile duct again.  Discussed with her the risks of bleeding, infection, injury to surrounding structures, the risk for an open procedure, and she is willing to proceed.  At this point we will plan for an outpatient procedure unless there are any issues during surgery.  We will schedule her tentatively for 09/02/2019.  This will mean that her last dose of Plavix should be taken on 08/27/2019.  She can continue taking her aspirin.  We will send clearance forms to Dr. Lorin Picket who is her PCP as well as Dr. Kirke Corin who is her cardiologist.  Face-to-face time spent with the patient and care providers was 40 minutes, with more than 50% of the time spent counseling, educating, and coordinating care of the patient.     Howie Ill, MD [facility]

## 2019-08-23 NOTE — Telephone Encounter (Signed)
   [facility] Pre-operative Risk Assessment    Request for surgical clearance:  1. What type of surgery is being performed? LAPOROSCOPIC CHOLOCYSTECTOMY WITH CHOLANGIOGRAM  2. When is this surgery scheduled? 09/02/19  3. What type of clearance is required (medical clearance vs. Pharmacy clearance to hold med vs. Both)? MEDICAL  4. Are there any medications that need to be held prior to surgery and how long? NOT LISTED  5. Practice name and name of physician performing surgery? [facility], DR JOSE PISCOYA  6. What is your office phone number [phone]   7.   What is your office fax [phone]  8.   Anesthesia type (None, local, MAC, general) ? NOT LISTED   Lucienne Minks 08/23/2019, 3:35 PM  _________________________________________________________________   (provider comments below)

## 2019-08-23 NOTE — Patient Instructions (Signed)
Our surgery scheduler will contact you within the next 24-48 hours to get you scheduled. Please at [facility] sheet available when scheduler contacts you.   Last dose of Plavix should be taken 08/27/19. Patient may continue taking Aspirin.

## 2019-08-23 NOTE — Patient Instructions (Signed)
Medication Instructions:  Med instructions for surgery:  1- Last dose of Plavix will be on 08/27/19 2- Continue Aspirin throughout 3- Restart Plavix per surgeon request.  *If you need a refill on your cardiac medications before your next appointment, please call your pharmacy*  Lab Work: None ordered  If you have labs (blood work) drawn today and your tests are completely normal, you will receive your results only by: Marland Kitchen MyChart Message (if you have MyChart) OR . A paper copy in the mail If you have any lab test that is abnormal or we need to change your treatment, we will call you to review the results.  Testing/Procedures: None ordered   Follow-Up: At [facility], you and your health needs are our priority.  As part of our continuing mission to provide you with exceptional heart care, we have created designated Provider Care Teams.  These Care Teams include your primary Cardiologist (physician) and Advanced Practice Providers (APPs -  Physician Assistants and Nurse Practitioners) who all work together to provide you with the care you need, when you need it.  Your next appointment:   3 months  The format for your next appointment:   In Person  Provider:    You may see Kathlyn Sacramento, MD or Christell Faith, PA-C.

## 2019-08-23 NOTE — Progress Notes (Signed)
08/23/2019  History of Present Illness: Tanya Cole is a 85 y.o. female with a history of gallstone pancreatitis and choledocholithiasis who was admitted on 08/04/2019 to the hospital.  She underwent an ERCP with Dr. Wohl on 10/8.  She does have a history of coronary artery disease with an MI in 09/2018 which required drug-eluting stent placement.  She was on aspirin and Plavix and the Plavix was stopped briefly for the ERCP.  However given her history of MI I decided to defer surgery until she was 1 year out so we can safely stop her medications and will be less stress for her body.  She was discharged to home on 10/9 but presented again to the emergency room on 10/15 with another episode of biliary colic.  At that point her LFTs were normal except for an elevated alkaline phosphatase of 180.  However given that she is now had 2 episodes over the last month, I wanted to discuss with her about doing surgery sooner rather than later.  I have emailed Dr. Arida who is her cardiologist and he agreed that discontinuing Plavix was appropriate prior to surgery but would rather keep her on aspirin given the potential risk for stent thrombosis.  Given the potential risk of bleeding with even aspirin on board, having a discussion with the patient today about what she would like to do with her surgery.  Past Medical History: Past Medical History:  Diagnosis Date  . Aortic insufficiency    a. TTE 12/19: EF 55-60%, probable HK of the mid apical anterior septal myocardium, Gr1DD, mild AI, mildly dilated LA  . Asthma   . CAD (coronary artery disease)    a. NSTEMI 12/19; b. LHC 10/08/18: LM minimal luminal irregs, mLAD-1 95% s/p PCI/DES, mLAD-2 60%, LCx mild diffuse disease throughout, RCA minimal luminal irregs  . Diabetes mellitus (HCC)   . Hypercholesterolemia   . Hypertension   . Osteopenia   . Polymyalgia rheumatica syndrome (HCC)   . Reactive airway disease      Past Surgical History: Past  Surgical History:  Procedure Laterality Date  . ABDOMINAL HYSTERECTOMY  1981   prolapse and bleeding, ovaries not removed  . BREAST EXCISIONAL BIOPSY Right   . CORONARY STENT INTERVENTION N/A 10/08/2018   Procedure: CORONARY STENT INTERVENTION;  Surgeon: Arida, Muhammad A, MD;  Location: [facility] INVASIVE CV LAB;  Service: Cardiovascular;  Laterality: N/A;  . ENDOSCOPIC RETROGRADE CHOLANGIOPANCREATOGRAPHY (ERCP) WITH PROPOFOL N/A 08/08/2019   Procedure: ENDOSCOPIC RETROGRADE CHOLANGIOPANCREATOGRAPHY (ERCP) WITH PROPOFOL;  Surgeon: Wohl, Darren, MD;  Location: [facility] ENDOSCOPY;  Service: Endoscopy;  Laterality: N/A;  . LEFT HEART CATH AND CORONARY ANGIOGRAPHY N/A 10/08/2018   Procedure: LEFT HEART CATH AND CORONARY ANGIOGRAPHY;  Surgeon: Arida, Muhammad A, MD;  Location: [facility] INVASIVE CV LAB;  Service: Cardiovascular;  Laterality: N/A;  . UMBILICAL HERNIA REPAIR  7/94    Home Medications: Prior to Admission medications   Medication Sig Start Date End Date Taking? Authorizing Provider  ADVAIR DISKUS 250-50 MCG/DOSE AEPB INHALE ONE PUFF BY MOUTH EVERY 12 HOURS. RINSE MOUTH AFTER EACH USE Patient taking differently: Inhale 1 puff into the lungs 2 (two) times daily.  02/17/16  Yes Scott, Charlene, MD  alendronate (FOSAMAX) 70 MG tablet Take 70 mg by mouth once a week.  11/06/17  Yes [provider]  aspirin 81 MG tablet Take 81 mg by mouth daily.   Yes [provider]  atorvastatin (LIPITOR) 40 MG tablet Take 1 tablet (40 mg total) by   mouth daily at 6 PM. 10/18/18  Yes Dunn, Areta Haber, PA-C  clopidogrel (PLAVIX) 75 MG tablet Take 1 tablet (75 mg total) by mouth daily with breakfast. 10/18/18  Yes Dunn, Areta Haber, PA-C  cyanocobalamin (,VITAMIN B-12,) 1000 MCG/ML injection Inject 1,000 mcg into the muscle every 30 (thirty) days.    Yes [provider]  fluticasone (FLONASE) 50 MCG/ACT nasal spray USE 2 SPRAY(S) IN EACH NOSTRIL ONCE DAILY Patient taking differently: Place 1 spray into both  nostrils daily.  08/27/18  Yes Einar Pheasant, MD  lisinopril (ZESTRIL) 20 MG tablet Take 1 tablet (20 mg total) by mouth daily. 04/15/19  Yes Dunn, Areta Haber, PA-C  metFORMIN (GLUCOPHAGE) 500 MG tablet TAKE 1 TABLET BY MOUTH TWICE DAILY WITH A MEAL Patient taking differently: Take 500 mg by mouth 2 (two) times daily with a meal.  05/15/19  Yes Einar Pheasant, MD  metoprolol tartrate (LOPRESSOR) 25 MG tablet Take 1 tablet (25 mg total) by mouth 2 (two) times daily. Patient taking differently: Take 12.5 mg by mouth 2 (two) times daily.  11/27/18  Yes Dunn, Areta Haber, PA-C  Multiple Vitamins-Minerals (VISION FORMULA/LUTEIN PO) Take by mouth daily.   Yes [provider]  nitroGLYCERIN (NITROSTAT) 0.4 MG SL tablet Place 1 tablet (0.4 mg total) under the tongue every 5 (five) minutes as needed for chest pain. 10/09/18  Yes Bettey Costa, MD  PROAIR HFA 108 (90 Base) MCG/ACT inhaler Inhale 2 puffs into the lungs every 6 (six) hours as needed. Patient taking differently: Inhale 2 puffs into the lungs every 6 (six) hours as needed for wheezing or shortness of breath.  05/28/19  Yes Einar Pheasant, MD  Vitamin D, Ergocalciferol, (DRISDOL) 50000 units CAPS capsule Take 50,000 Units by mouth once a week. 06/24/18  Yes [provider]    Allergies: Allergies  Allergen Reactions  . Tramadol Itching and Nausea And Vomiting    Review of Systems: Review of Systems  Constitutional: Negative for chills and fever.  Respiratory: Negative for shortness of breath.   Cardiovascular: Negative for chest pain.  Gastrointestinal: Positive for abdominal pain and nausea. Negative for diarrhea and vomiting.    Physical Exam BP (!) 150/64   Pulse 72   Temp (!) 97.5 F (36.4 C) (Temporal)   Ht 5\' 1"  (1.549 m)   Wt 114 lb 6.4 oz (51.9 kg)   SpO2 96%   BMI 21.62 kg/m  CONSTITUTIONAL: No acute distress HEENT:  Normocephalic, atraumatic, extraocular motion intact. RESPIRATORY:  Lungs are clear, and breath  sounds are equal bilaterally. Normal respiratory effort without pathologic use of accessory muscles. CARDIOVASCULAR: Heart is regular without murmurs, gallops, or rubs. GI: The abdomen is soft, nondistended, with mild discomfort to palpation in the epigastric and right upper quadrant areas.  Negative Murphy's sign.  NEUROLOGIC:  Motor and sensation is grossly normal.  Cranial nerves are grossly intact. PSYCH:  Alert and oriented to person, place and time. Affect is normal.  Labs/Imaging: Labs on 08/15/2019: Sodium 140, potassium 4.2, chloride 102, CO2 25, BUN 18, creatinine 0.74.  Total bilirubin 1.0, AST 21, ALT 31, alkaline phosphatase 180.  Lipase 26.  White blood cell count 18, hemoglobin 12.3, hematocrit 37.7, platelet count 419.  Assessment and Plan: This is a 84 y.o. female with recent history of gallstone pancreatitis and choledocholithiasis status post ERCP.  -Discussed with the patient that given that she has had now 2 episodes over the past month, it would be worthwhile to proceed with surgery sooner  rather than later.  Discussed with her that I had emailed with Dr. Kirke Corin about potentially coming off her aspirin and Plavix.  Dr. Verline Lema mentioned that being that she is more than 6 months out from her stent placement, it would be appropriate to discontinue the Plavix.  However it would be ideal to keep her on aspirin given the risk of stent thrombosis if both medications are off.  Although ideally it would be better to do surgery with neither aspirin or Plavix, my concern is that with the risk of stent thrombosis, if anything like that were to happen postoperatively, she will also need to be reloaded on Plavix and potential interventions which could make any issues such as bleeding from the wound beds worse so I think it be better to deal with the bleeding risk upfront rather than afterwards.  I discussed this with the patient and she is in agreement and is willing to proceed this way. -Discussed  with the patient the role for laparoscopic cholecystectomy with intraoperative cholangiogram in order to remove the gallbladder and evaluate the common bile duct again.  Discussed with her the risks of bleeding, infection, injury to surrounding structures, the risk for an open procedure, and she is willing to proceed.  At this point we will plan for an outpatient procedure unless there are any issues during surgery.  We will schedule her tentatively for 09/02/2019.  This will mean that her last dose of Plavix should be taken on 08/27/2019.  She can continue taking her aspirin.  We will send clearance forms to Dr. Lorin Picket who is her PCP as well as Dr. Kirke Corin who is her cardiologist.  Face-to-face time spent with the patient and care providers was 40 minutes, with more than 50% of the time spent counseling, educating, and coordinating care of the patient.     Howie Ill, MD [facility]

## 2019-08-26 ENCOUNTER — Telehealth: Payer: Self-pay

## 2019-08-26 NOTE — Telephone Encounter (Signed)
Pt has been advised of pre admission date/time, Covid Testing date and Surgery date.  Surgery Date: 09/02/19 Preadmission Testing Date: 08/28/19@1 :00 pm Covid Testing Date: 08/29/19 - patient advised to go to the [facility] ([place])  [employer] Video sent via [employer] [facility] and [employer].  Patient has been made aware to call [phone], between 1-3:00pm the day before surgery, to find out what time to arrive.

## 2019-08-26 NOTE — Progress Notes (Signed)
Faxed Cardiac Clearance to Dr.Muhammad Arida on 08/24/19.  Faxed PCP Medical Clearance to Dr.Charlene Scott on 08/24/19.

## 2019-08-27 ENCOUNTER — Ambulatory Visit (INDEPENDENT_AMBULATORY_CARE_PROVIDER_SITE_OTHER): Payer: Medicare HMO | Admitting: Internal Medicine

## 2019-08-27 ENCOUNTER — Other Ambulatory Visit: Payer: Self-pay

## 2019-08-27 ENCOUNTER — Telehealth: Payer: Self-pay

## 2019-08-27 DIAGNOSIS — I10 Essential (primary) hypertension: Secondary | ICD-10-CM | POA: Diagnosis not present

## 2019-08-27 DIAGNOSIS — I2510 Atherosclerotic heart disease of native coronary artery without angina pectoris: Secondary | ICD-10-CM

## 2019-08-27 DIAGNOSIS — J4520 Mild intermittent asthma, uncomplicated: Secondary | ICD-10-CM | POA: Diagnosis not present

## 2019-08-27 DIAGNOSIS — Z01818 Encounter for other preprocedural examination: Secondary | ICD-10-CM | POA: Diagnosis not present

## 2019-08-27 DIAGNOSIS — E1159 Type 2 diabetes mellitus with other circulatory complications: Secondary | ICD-10-CM

## 2019-08-27 NOTE — Telephone Encounter (Signed)
Received medical clearance form from Dr Hampton Abbot. Pt is having laparoscopic cholecystectomy with cholangiogram on 09/02/19. She was seen 08/16/19 for HFU. Cardiac clearance has been sent to Dr Fletcher Anon. Do you want to do another visit with her prior to procedure?

## 2019-08-27 NOTE — Telephone Encounter (Signed)
Pt scheduled for pre op virtually at 4:30 today. Ok per Dr Nicki Reaper

## 2019-08-27 NOTE — Progress Notes (Signed)
[name], female   DOB: 1933-03-25, 85 y.o.   MRN: [id number]   Virtual Visit via telephone Note  This visit type was conducted due to national recommendations for restrictions regarding the COVID-19 pandemic (e.g. social distancing).  This format is felt to be most appropriate for this patient at this time.  All issues noted in this document were discussed and addressed.  No physical exam was performed (except for noted visual exam findings with Video Visits).   I connected with [name] by telephone and verified that I am speaking with the correct person using two identifiers. Location patient: home Location provider: work Persons participating in the telephone visit: patient, provider  I discussed the limitations, risks, security and privacy concerns of performing an evaluation and management service by telephone and the availability of in person appointments.  The patient expressed understanding and agreed to proceed.   Reason for visit: pre op evaluation.   HPI: Planning for laparoscopic cholecystectomy with cholangiogram - 09/02/19.  She was admitted recently with choledocholithiasis s/p ERCP with stone removal complicated by gram negative bacteremia and UTI.  Symptoms improved and plan was initially to wait until she was 12 months out from her MR prior to undergoing cholecystectomy.  (admitted 09/2018 with MI s/p PCI/DES to the mid LAD).  She had a return of abdominal pain with several episodes of emesis and was seen in ER on 08/15/19.  RUQ ultrasound revealed cholelithiasis with mild gallbladder wall thickening.  Evaluated by surgery and recommended cholecystectomy.  Evaluated by cardiology 08/23/19.  Plan is to proceed with surgery and continuing aspirin.  Cardiology note reviewed.  She is here to day for medical pre op evaluation.  She reports she is doing well.  Denies any chest pain.  Feels her breathing is doing well.  Denies any sob.  No cough or congestion.   No acid reflux.  No abdominal pain currently.  Has not had any episodes since the most recent ER visit.     ROS: See pertinent positives and negatives per HPI.  Past Medical History:  Diagnosis Date  . Aortic insufficiency    a. TTE 12/19: EF 55-60%, probable HK of the mid apical anterior septal myocardium, Gr1DD, mild AI, mildly dilated LA  . Asthma   . CAD (coronary artery disease)    a. NSTEMI 12/19; b. LHC 10/08/18: LM minimal luminal irregs, mLAD-1 95% s/p PCI/DES, mLAD-2 60%, LCx mild diffuse disease throughout, RCA minimal luminal irregs  . Diabetes mellitus ([place])   . Hypercholesterolemia   . Hypertension   . Myocardial infarction ([place])   . Osteopenia   . Polymyalgia rheumatica syndrome ([place])   . Reactive airway disease     Past Surgical History:  Procedure Laterality Date  . ABDOMINAL HYSTERECTOMY  1981   prolapse and bleeding, ovaries not removed  . BREAST EXCISIONAL BIOPSY Right   . CORONARY STENT INTERVENTION N/A 10/08/2018   Procedure: CORONARY STENT INTERVENTION;  Surgeon: Wellington Hampshire, MD;  Location: [place] CV LAB;  Service: Cardiovascular;  Laterality: N/A;  . ENDOSCOPIC RETROGRADE CHOLANGIOPANCREATOGRAPHY (ERCP) WITH PROPOFOL N/A 08/08/2019   Procedure: ENDOSCOPIC RETROGRADE CHOLANGIOPANCREATOGRAPHY (ERCP) WITH PROPOFOL;  Surgeon: Lucilla Lame, MD;  Location: [facility] ENDOSCOPY;  Service: Endoscopy;  Laterality: N/A;  . LEFT HEART CATH AND CORONARY ANGIOGRAPHY N/A 10/08/2018   Procedure: LEFT HEART CATH AND CORONARY ANGIOGRAPHY;  Surgeon: Wellington Hampshire, MD;  Location: [place] CV LAB;  Service: Cardiovascular;  Laterality: N/A;  . UMBILICAL HERNIA  REPAIR  7/94    Family History  Problem Relation Age of Onset  . Heart attack Father   . Arthritis Mother   . Heart disease Mother   . Throat cancer Sister   . Parkinson's disease Sister   . COPD Brother     SOCIAL HX: reviewed.    Current Outpatient Medications:  .  ADVAIR DISKUS 250-50 MCG/DOSE  AEPB, INHALE ONE PUFF BY MOUTH EVERY 12 HOURS. RINSE MOUTH AFTER EACH USE (Patient taking differently: Inhale 1 puff into the lungs 2 (two) times daily. ), Disp: 60 each, Rfl: 5 .  alendronate (FOSAMAX) 70 MG tablet, Take 70 mg by mouth once a week. , Disp: , Rfl:  .  aspirin 81 MG tablet, Take 81 mg by mouth daily., Disp: , Rfl:  .  atorvastatin (LIPITOR) 40 MG tablet, Take 1 tablet (40 mg total) by mouth daily at 6 PM., Disp: 90 tablet, Rfl: 3 .  clopidogrel (PLAVIX) 75 MG tablet, Take 1 tablet (75 mg total) by mouth daily with breakfast. (Patient not taking: Reported on 08/28/2019), Disp: 90 tablet, Rfl: 3 .  cyanocobalamin (,VITAMIN B-12,) 1000 MCG/ML injection, Inject 1,000 mcg into the muscle every 30 (thirty) days. , Disp: , Rfl:  .  fluticasone (FLONASE) 50 MCG/ACT nasal spray, USE 2 SPRAY(S) IN EACH NOSTRIL ONCE DAILY (Patient taking differently: Place 1 spray into both nostrils daily. ), Disp: 16 g, Rfl: 3 .  lisinopril (ZESTRIL) 20 MG tablet, Take 1 tablet (20 mg total) by mouth daily., Disp: 90 tablet, Rfl: 3 .  metFORMIN (GLUCOPHAGE) 500 MG tablet, TAKE 1 TABLET BY MOUTH TWICE DAILY WITH A MEAL (Patient taking differently: Take 500 mg by mouth 2 (two) times daily with a meal. ), Disp: 180 tablet, Rfl: 1 .  metoprolol tartrate (LOPRESSOR) 25 MG tablet, Take 1 tablet (25 mg total) by mouth 2 (two) times daily. (Patient taking differently: Take 12.5 mg by mouth 2 (two) times daily. ), Disp: 90 tablet, Rfl: 3 .  Multiple Vitamins-Minerals (VISION FORMULA/LUTEIN PO), Take by mouth daily., Disp: , Rfl:  .  nitroGLYCERIN (NITROSTAT) 0.4 MG SL tablet, Place 1 tablet (0.4 mg total) under the tongue every 5 (five) minutes as needed for chest pain., Disp: 30 tablet, Rfl: 12 .  PROAIR HFA 108 (90 Base) MCG/ACT inhaler, Inhale 2 puffs into the lungs every 6 (six) hours as needed. (Patient taking differently: Inhale 2 puffs into the lungs every 6 (six) hours as needed for wheezing or shortness of breath.  ), Disp: 18 g, Rfl: 2 .  Vitamin D, Ergocalciferol, (DRISDOL) 50000 units CAPS capsule, Take 50,000 Units by mouth once a week., Disp: , Rfl: 3  EXAM:  GENERAL: alert. Sounds to be in no acute distress.  Answering questions appropriately.    PSYCH/NEURO: pleasant and cooperative, no obvious depression or anxiety, speech and thought processing grossly intact  ASSESSMENT AND PLAN:  Discussed the following assessment and plan:  CAD (coronary artery disease) S/p PCI/DES - mid LAD.  Just saw cardiology for pre op evaluation.  Moderate risk. Plan to proceed with surgery (cholecystectomy) - holding plavix as outlined in cardiology note.  Will continue aspirin if bleeding risk determined not to be too great by surgical team.  See cardiology note for recommendations.    Diabetes mellitus with cardiac complication (HCC) States sugars have been doing well. No low sugars.  She is on metformin.  Will have her hold metformiin one day prior to surgery, day of surgery and day  after surgery.  Will need monitoring of sugars in the pre/peri/post op period to avoid lows or extreme highs.    Hypertension Per cardiology, blood pressure under good control.  Will need close intra op and post op monitoring of her heart rate and blood pressure to avoid extremes.    Reactive airway disease Her breathing is stable.  No sob.  No cough or congestion.  Will use advair daily prior to surgery and in the post op period.    Pre-op evaluation See cardiology note for cardiac clearance, discussion about stopping plavix and remaining on aspirin.  Per note, plan to proceed with non cardiac surgery - moderate risk.  Will need close intra op and post op monitoring of heart rate and blood pressure to avoid extremes.  Will hold metformin as outlined.  Will need close monitoring of blood sugars in the pre/peri/post op period as outlined. Breathing is stable.  Recommend inhalers as outlined.      I discussed the assessment and  treatment plan with the patient. The patient was provided an opportunity to ask questions and all were answered. The patient agreed with the plan and demonstrated an understanding of the instructions.   The patient was advised to call back or seek an in-person evaluation if the symptoms worsen or if the condition fails to improve as anticipated.  I provided 13 minutes of non-face-to-face time during this encounter.   Dale [place]aca, MD

## 2019-08-27 NOTE — Telephone Encounter (Signed)
Yes (given her history)

## 2019-08-28 ENCOUNTER — Encounter
Admission: RE | Admit: 2019-08-28 | Discharge: 2019-08-28 | Disposition: A | Discharge status: Home / Self Care | Payer: Medicare HMO | Source: Ambulatory Visit | Attending: Surgery | Admitting: Surgery

## 2019-08-28 DIAGNOSIS — Z01818 Encounter for other preprocedural examination: Secondary | ICD-10-CM | POA: Insufficient documentation

## 2019-08-28 HISTORY — DX: Acute myocardial infarction, unspecified: I21.9

## 2019-08-28 NOTE — Patient Instructions (Signed)
Your procedure is scheduled on: Monday 09/02/19.  Report to DAY SURGERY DEPARTMENT LOCATED ON 2ND FLOOR MEDICAL MALL ENTRANCE. To find out your arrival time please call [phone] between 1PM - 3PM on Friday 08/30/19.   Remember: Instructions that are not followed completely may result in serious medical risk, up to and including death, or upon the discretion of your surgeon and anesthesiologist your surgery may need to be rescheduled.      _X__ 1. Do not eat food after midnight the night before your procedure.                 No gum chewing or hard candies. You may drink SUGAR FREE clear liquids up to 2 hours                 before you are scheduled to arrive for your surgery- DO NOT drink clear                 liquids within 2 hours of the start of your surgery.                   __X__2.  On the morning of surgery brush your teeth with toothpaste and water, you may rinse your mouth with mouthwash if you wish.  Do not swallow any toothpaste or mouthwash.    __X__3.  Notify your doctor if there is any change in your medical condition      (cold, fever, infections).       Do not wear jewelry, make-up, hairpins, clips or nail polish. Do not wear lotions, powders, or perfumes.  Do not shave 48 hours prior to surgery. Men may shave face and neck. Do not bring valuables to the hospital.     [facility] is not responsible for any belongings or valuables.    Contacts, dentures/partials or body piercings may not be worn into surgery. Bring a case for your contacts, glasses or hearing aids, a denture cup will be supplied.     Patients discharged the day of surgery will not be allowed to drive home.      __X__ Take these medicines the morning of surgery with A SIP OF WATER:     1. ADVAIR DISKUS 250-50 MCG/DOSE AEPB  2. fluticasone (FLONASE) 50 MCG/ACT nasal spray  3. metoprolol tartrate (LOPRESSOR) 25 MG tablet  4. PROAIR HFA 108 (90 Base) MCG/ACT inhaler      __X__ Use  CHG Soap as directed   _ X___ Use inhalers on the day of surgery. Also bring the inhaler with you to the hospital on the morning of surgery.   __X__ Stop Metformin. Dr. Nicki Reaper has already advised you to stop taking this medication.    __X__ Stop Blood Thinners Coumadin: Plavix according to Christell Faith, the Cardiology PA, Your last dose was 08/27/19. Continue to take your Aspirin according to his instructions.   __X__ Stop Anti-inflammatories 7 days before surgery such as Advil, Ibuprofen, Motrin, BC or Goodies Powder, Naprosyn, Naproxen, Aleve, Aspirin, Meloxicam. May take Tylenol if needed for pain or discomfort.    __X__ Don't start taking any new herbal supplements before your procedure.

## 2019-08-29 ENCOUNTER — Other Ambulatory Visit
Admission: RE | Admit: 2019-08-29 | Discharge: 2019-08-29 | Disposition: A | Discharge status: Home / Self Care | Payer: Medicare HMO | Source: Ambulatory Visit | Attending: Surgery | Admitting: Surgery

## 2019-08-29 ENCOUNTER — Other Ambulatory Visit: Payer: Self-pay

## 2019-08-29 ENCOUNTER — Telehealth: Payer: Self-pay

## 2019-08-29 DIAGNOSIS — Z01812 Encounter for preprocedural laboratory examination: Secondary | ICD-10-CM | POA: Insufficient documentation

## 2019-08-29 DIAGNOSIS — Z20828 Contact with and (suspected) exposure to other viral communicable diseases: Secondary | ICD-10-CM | POA: Diagnosis not present

## 2019-08-29 LAB — SARS CORONAVIRUS 2 (TAT 6-24 HRS): SARS Coronavirus 2: NEGATIVE

## 2019-08-29 NOTE — Telephone Encounter (Signed)
Copied from [place] [id number]. Topic: General - Inquiry >> Aug 29, 2019 10:35 AM Berneta Levins wrote: Reason for CRM:   Hoyle Sauer from [facility] calling to check on the status of surgical clearance sent over 08/26/2019.  Pt's surgery scheduled for 09/02/2019. Hoyle Sauer can be reached at [phone]

## 2019-08-29 NOTE — Telephone Encounter (Signed)
Caller name: Hoyle Sauer  Relation to pt: from [facility]  Call back number: [phone]  Reason for call:  Checking on the status of surgical clearance approval, Wellington Hampshire, MD gave cardiac clearance awaiting PCP clearance. Time is sensitive seeking a follow up call today

## 2019-08-29 NOTE — Progress Notes (Addendum)
08/29/19 - Per Dr.Arida patient is at Moderate Risk for Noncardiac surgery.   08/30/19 - Per Dr.Scott patient is at Moderate Risk for Noncardiac surgery (Laparoscopic Cholecystectomy with Cholangiogram).

## 2019-08-29 NOTE — Telephone Encounter (Signed)
Called to speak with Tanya Cole, was not available. Spoke with Tanya Cole. Advised that everything will be sent over to them in the morning.

## 2019-08-30 ENCOUNTER — Encounter: Payer: Self-pay | Admitting: Internal Medicine

## 2019-08-30 DIAGNOSIS — Z01818 Encounter for other preprocedural examination: Secondary | ICD-10-CM | POA: Insufficient documentation

## 2019-08-30 NOTE — Assessment & Plan Note (Signed)
States sugars have been doing well. No low sugars.  She is on metformin.  Will have her hold metformiin one day prior to surgery, day of surgery and day after surgery.  Will need monitoring of sugars in the pre/peri/post op period to avoid lows or extreme highs.

## 2019-08-30 NOTE — Telephone Encounter (Signed)
Form and note faxed.

## 2019-08-30 NOTE — Assessment & Plan Note (Signed)
Per cardiology, blood pressure under good control.  Will need close intra op and post op monitoring of her heart rate and blood pressure to avoid extremes.

## 2019-08-30 NOTE — Assessment & Plan Note (Signed)
Her breathing is stable.  No sob.  No cough or congestion.  Will use advair daily prior to surgery and in the post op period.

## 2019-08-30 NOTE — Assessment & Plan Note (Signed)
See cardiology note for cardiac clearance, discussion about stopping plavix and remaining on aspirin.  Per note, plan to proceed with non cardiac surgery - moderate risk.  Will need close intra op and post op monitoring of heart rate and blood pressure to avoid extremes.  Will hold metformin as outlined.  Will need close monitoring of blood sugars in the pre/peri/post op period as outlined. Breathing is stable.  Recommend inhalers as outlined.

## 2019-08-30 NOTE — Telephone Encounter (Signed)
Form completed.  Please send form along with 08/27/19 office note

## 2019-08-30 NOTE — Assessment & Plan Note (Signed)
S/p PCI/DES - mid LAD.  Just saw cardiology for pre op evaluation.  Moderate risk. Plan to proceed with surgery (cholecystectomy) - holding plavix as outlined in cardiology note.  Will continue aspirin if bleeding risk determined not to be too great by surgical team.  See cardiology note for recommendations.

## 2019-09-02 ENCOUNTER — Ambulatory Visit: Payer: Medicare HMO

## 2019-09-02 ENCOUNTER — Encounter
Admission: RE | Disposition: A | Discharge status: Home / Self Care | Payer: Self-pay | Source: Home / Self Care | Attending: Surgery

## 2019-09-02 ENCOUNTER — Ambulatory Visit: Payer: Medicare HMO | Admitting: Anesthesiology

## 2019-09-02 ENCOUNTER — Ambulatory Visit
Admission: RE | Admit: 2019-09-02 | Discharge: 2019-09-02 | Disposition: A | Discharge status: Home / Self Care | Payer: Medicare HMO | Attending: Surgery | Admitting: Surgery

## 2019-09-02 ENCOUNTER — Telehealth: Payer: Self-pay | Admitting: *Deleted

## 2019-09-02 ENCOUNTER — Other Ambulatory Visit: Payer: Self-pay

## 2019-09-02 DIAGNOSIS — K8064 Calculus of gallbladder and bile duct with chronic cholecystitis without obstruction: Secondary | ICD-10-CM | POA: Diagnosis not present

## 2019-09-02 DIAGNOSIS — Z955 Presence of coronary angioplasty implant and graft: Secondary | ICD-10-CM | POA: Diagnosis not present

## 2019-09-02 DIAGNOSIS — Z79899 Other long term (current) drug therapy: Secondary | ICD-10-CM | POA: Insufficient documentation

## 2019-09-02 DIAGNOSIS — I252 Old myocardial infarction: Secondary | ICD-10-CM | POA: Insufficient documentation

## 2019-09-02 DIAGNOSIS — M8580 Other specified disorders of bone density and structure, unspecified site: Secondary | ICD-10-CM | POA: Insufficient documentation

## 2019-09-02 DIAGNOSIS — E119 Type 2 diabetes mellitus without complications: Secondary | ICD-10-CM | POA: Diagnosis not present

## 2019-09-02 DIAGNOSIS — K851 Biliary acute pancreatitis without necrosis or infection: Secondary | ICD-10-CM

## 2019-09-02 DIAGNOSIS — Z7983 Long term (current) use of bisphosphonates: Secondary | ICD-10-CM | POA: Insufficient documentation

## 2019-09-02 DIAGNOSIS — J45909 Unspecified asthma, uncomplicated: Secondary | ICD-10-CM | POA: Insufficient documentation

## 2019-09-02 DIAGNOSIS — Z9049 Acquired absence of other specified parts of digestive tract: Secondary | ICD-10-CM | POA: Diagnosis not present

## 2019-09-02 DIAGNOSIS — Z7951 Long term (current) use of inhaled steroids: Secondary | ICD-10-CM | POA: Insufficient documentation

## 2019-09-02 DIAGNOSIS — I10 Essential (primary) hypertension: Secondary | ICD-10-CM | POA: Diagnosis not present

## 2019-09-02 DIAGNOSIS — M353 Polymyalgia rheumatica: Secondary | ICD-10-CM | POA: Diagnosis not present

## 2019-09-02 DIAGNOSIS — Z7982 Long term (current) use of aspirin: Secondary | ICD-10-CM | POA: Insufficient documentation

## 2019-09-02 DIAGNOSIS — Z7902 Long term (current) use of antithrombotics/antiplatelets: Secondary | ICD-10-CM | POA: Insufficient documentation

## 2019-09-02 DIAGNOSIS — Z885 Allergy status to narcotic agent status: Secondary | ICD-10-CM | POA: Diagnosis not present

## 2019-09-02 DIAGNOSIS — I2510 Atherosclerotic heart disease of native coronary artery without angina pectoris: Secondary | ICD-10-CM | POA: Insufficient documentation

## 2019-09-02 DIAGNOSIS — E7800 Pure hypercholesterolemia, unspecified: Secondary | ICD-10-CM | POA: Insufficient documentation

## 2019-09-02 DIAGNOSIS — Z7984 Long term (current) use of oral hypoglycemic drugs: Secondary | ICD-10-CM | POA: Insufficient documentation

## 2019-09-02 DIAGNOSIS — Z888 Allergy status to other drugs, medicaments and biological substances status: Secondary | ICD-10-CM | POA: Diagnosis not present

## 2019-09-02 DIAGNOSIS — K8010 Calculus of gallbladder with chronic cholecystitis without obstruction: Secondary | ICD-10-CM | POA: Diagnosis not present

## 2019-09-02 DIAGNOSIS — Z419 Encounter for procedure for purposes other than remedying health state, unspecified: Secondary | ICD-10-CM

## 2019-09-02 HISTORY — PX: CHOLECYSTECTOMY: SHX55

## 2019-09-02 LAB — GLUCOSE, CAPILLARY
Glucose-Capillary: 122 mg/dL — ABNORMAL HIGH (ref 70–99)
Glucose-Capillary: 179 mg/dL — ABNORMAL HIGH (ref 70–99)

## 2019-09-02 SURGERY — LAPAROSCOPIC CHOLECYSTECTOMY WITH INTRAOPERATIVE CHOLANGIOGRAM
Anesthesia: General | Site: Abdomen

## 2019-09-02 MED ORDER — PHENYLEPHRINE HCL (PRESSORS) 10 MG/ML IV SOLN
INTRAVENOUS | Status: DC | PRN
  Administered 2019-09-02: 200 ug via INTRAVENOUS
  Administered 2019-09-02: 100 ug via INTRAVENOUS

## 2019-09-02 MED ORDER — ROCURONIUM BROMIDE 50 MG/5ML IV SOLN
INTRAVENOUS | Status: AC
  Filled 2019-09-02: qty 1

## 2019-09-02 MED ORDER — PROPOFOL 10 MG/ML IV BOLUS
INTRAVENOUS | Status: DC | PRN
  Administered 2019-09-02: 100 mg via INTRAVENOUS

## 2019-09-02 MED ORDER — HYDROCODONE-ACETAMINOPHEN 5-325 MG PO TABS: 1.0000 | ORAL_TABLET | ORAL | 0 refills | Status: DC | PRN

## 2019-09-02 MED ORDER — FENTANYL CITRATE (PF) 100 MCG/2ML IJ SOLN
25.0000 ug | INTRAMUSCULAR | Status: DC | PRN
  Administered 2019-09-02: 13:00:00 25 ug via INTRAVENOUS

## 2019-09-02 MED ORDER — SUGAMMADEX SODIUM 200 MG/2ML IV SOLN
INTRAVENOUS | Status: AC
  Filled 2019-09-02: qty 2

## 2019-09-02 MED ORDER — GABAPENTIN 300 MG PO CAPS
300.0000 mg | ORAL_CAPSULE | ORAL | Status: AC
  Administered 2019-09-02: 10:00:00 300 mg via ORAL

## 2019-09-02 MED ORDER — FENTANYL CITRATE (PF) 100 MCG/2ML IJ SOLN
INTRAMUSCULAR | Status: DC | PRN
  Administered 2019-09-02: 50 ug via INTRAVENOUS
  Administered 2019-09-02 (×2): 25 ug via INTRAVENOUS

## 2019-09-02 MED ORDER — BUPIVACAINE-EPINEPHRINE (PF) 0.25% -1:200000 IJ SOLN
INTRAMUSCULAR | Status: DC | PRN
  Administered 2019-09-02: 30 mL

## 2019-09-02 MED ORDER — FENTANYL CITRATE (PF) 100 MCG/2ML IJ SOLN
INTRAMUSCULAR | Status: AC
  Administered 2019-09-02: 25 ug via INTRAVENOUS
  Filled 2019-09-02: qty 2

## 2019-09-02 MED ORDER — OXYCODONE HCL 5 MG/5ML PO SOLN: 5.0000 mg | Freq: Once | ORAL | Status: DC | PRN

## 2019-09-02 MED ORDER — LIDOCAINE HCL (PF) 2 % IJ SOLN
INTRAMUSCULAR | Status: AC
  Filled 2019-09-02: qty 10

## 2019-09-02 MED ORDER — CEFAZOLIN SODIUM-DEXTROSE 2-4 GM/100ML-% IV SOLN
INTRAVENOUS | Status: AC
  Filled 2019-09-02: qty 100

## 2019-09-02 MED ORDER — OXYCODONE HCL 5 MG PO TABS: 5.0000 mg | ORAL_TABLET | Freq: Once | ORAL | Status: DC | PRN

## 2019-09-02 MED ORDER — ONDANSETRON HCL 4 MG/2ML IJ SOLN
INTRAMUSCULAR | Status: DC | PRN
  Administered 2019-09-02: 4 mg via INTRAVENOUS

## 2019-09-02 MED ORDER — DEXAMETHASONE SODIUM PHOSPHATE 10 MG/ML IJ SOLN
INTRAMUSCULAR | Status: DC | PRN
  Administered 2019-09-02: 5 mg via INTRAVENOUS

## 2019-09-02 MED ORDER — CEFAZOLIN SODIUM-DEXTROSE 2-4 GM/100ML-% IV SOLN
2.0000 g | INTRAVENOUS | Status: AC
  Administered 2019-09-02: 2 g via INTRAVENOUS

## 2019-09-02 MED ORDER — FENTANYL CITRATE (PF) 100 MCG/2ML IJ SOLN: 25.0000 ug | INTRAMUSCULAR | Status: DC | PRN

## 2019-09-02 MED ORDER — ACETAMINOPHEN 500 MG PO TABS
1000.0000 mg | ORAL_TABLET | ORAL | Status: AC
  Administered 2019-09-02: 10:00:00 1000 mg via ORAL

## 2019-09-02 MED ORDER — EPHEDRINE SULFATE 50 MG/ML IJ SOLN
INTRAMUSCULAR | Status: AC
  Filled 2019-09-02: qty 1

## 2019-09-02 MED ORDER — FAMOTIDINE 20 MG PO TABS
20.0000 mg | ORAL_TABLET | Freq: Once | ORAL | Status: AC
  Administered 2019-09-02: 10:00:00 20 mg via ORAL

## 2019-09-02 MED ORDER — EPHEDRINE SULFATE 50 MG/ML IJ SOLN
INTRAMUSCULAR | Status: DC | PRN
  Administered 2019-09-02: 10 mg via INTRAVENOUS

## 2019-09-02 MED ORDER — DEXAMETHASONE SODIUM PHOSPHATE 10 MG/ML IJ SOLN
INTRAMUSCULAR | Status: AC
  Filled 2019-09-02: qty 1

## 2019-09-02 MED ORDER — CHLORHEXIDINE GLUCONATE CLOTH 2 % EX PADS: 6.0000 | MEDICATED_PAD | Freq: Once | CUTANEOUS | Status: DC

## 2019-09-02 MED ORDER — ROCURONIUM BROMIDE 100 MG/10ML IV SOLN
INTRAVENOUS | Status: DC | PRN
  Administered 2019-09-02: 30 mg via INTRAVENOUS
  Administered 2019-09-02: 10 mg via INTRAVENOUS

## 2019-09-02 MED ORDER — IBUPROFEN 400 MG PO TABS: 400.0000 mg | ORAL_TABLET | Freq: Three times a day (TID) | ORAL | 0 refills | Status: DC | PRN

## 2019-09-02 MED ORDER — SUCCINYLCHOLINE CHLORIDE 20 MG/ML IJ SOLN
INTRAMUSCULAR | Status: AC
  Filled 2019-09-02: qty 1

## 2019-09-02 MED ORDER — GABAPENTIN 300 MG PO CAPS
ORAL_CAPSULE | ORAL | Status: AC
  Administered 2019-09-02: 300 mg via ORAL
  Filled 2019-09-02: qty 1

## 2019-09-02 MED ORDER — LIDOCAINE HCL (CARDIAC) PF 100 MG/5ML IV SOSY
PREFILLED_SYRINGE | INTRAVENOUS | Status: DC | PRN
  Administered 2019-09-02: 50 mg via INTRAVENOUS

## 2019-09-02 MED ORDER — PROPOFOL 10 MG/ML IV BOLUS
INTRAVENOUS | Status: AC
  Filled 2019-09-02: qty 20

## 2019-09-02 MED ORDER — SODIUM CHLORIDE 0.9 % IV SOLN
INTRAVENOUS | Status: DC
  Administered 2019-09-02: 11:00:00 via INTRAVENOUS

## 2019-09-02 MED ORDER — FENTANYL CITRATE (PF) 100 MCG/2ML IJ SOLN
INTRAMUSCULAR | Status: AC
  Filled 2019-09-02: qty 2

## 2019-09-02 MED ORDER — ACETAMINOPHEN 500 MG PO TABS
ORAL_TABLET | ORAL | Status: AC
  Administered 2019-09-02: 10:00:00 1000 mg via ORAL
  Filled 2019-09-02: qty 2

## 2019-09-02 MED ORDER — ONDANSETRON HCL 4 MG/2ML IJ SOLN
INTRAMUSCULAR | Status: AC
  Filled 2019-09-02: qty 2

## 2019-09-02 MED ORDER — ONDANSETRON HCL 4 MG/2ML IJ SOLN
4.0000 mg | Freq: Once | INTRAMUSCULAR | Status: AC | PRN
  Administered 2019-09-02: 14:00:00 4 mg via INTRAVENOUS

## 2019-09-02 MED ORDER — FAMOTIDINE 20 MG PO TABS
ORAL_TABLET | ORAL | Status: AC
  Administered 2019-09-02: 20 mg via ORAL
  Filled 2019-09-02: qty 1

## 2019-09-02 SURGICAL SUPPLY — 46 items
APPLIER CLIP 5 13 M/L LIGAMAX5 (MISCELLANEOUS) ×4
BLADE SURG 15 STRL LF DISP TIS (BLADE) ×2 IMPLANT
BLADE SURG 15 STRL SS (BLADE) ×2
CANISTER SUCT 1200ML W/VALVE (MISCELLANEOUS) ×4 IMPLANT
CATH CHOLANGI 4FR 420404F (CATHETERS) ×4 IMPLANT
CHLORAPREP W/TINT 26 (MISCELLANEOUS) ×4 IMPLANT
CLIP APPLIE 5 13 M/L LIGAMAX5 (MISCELLANEOUS) ×2 IMPLANT
CONRAY 60ML FOR OR (MISCELLANEOUS) ×4 IMPLANT
COVER WAND RF STERILE (DRAPES) ×4 IMPLANT
DERMABOND ADVANCED (GAUZE/BANDAGES/DRESSINGS) ×2
DERMABOND ADVANCED .7 DNX12 (GAUZE/BANDAGES/DRESSINGS) ×2 IMPLANT
DRAPE C-ARM XRAY 36X54 (DRAPES) ×4 IMPLANT
ELECT CAUTERY BLADE TIP 2.5 (TIP) ×4
ELECT REM PT RETURN 9FT ADLT (ELECTROSURGICAL) ×4
ELECTRODE CAUTERY BLDE TIP 2.5 (TIP) ×2 IMPLANT
ELECTRODE REM PT RTRN 9FT ADLT (ELECTROSURGICAL) ×2 IMPLANT
GLOVE SURG SYN 7.0 (GLOVE) ×16 IMPLANT
GLOVE SURG SYN 7.5  E (GLOVE) ×8
GLOVE SURG SYN 7.5 E (GLOVE) ×8 IMPLANT
GOWN STRL REUS W/ TWL LRG LVL3 (GOWN DISPOSABLE) ×10 IMPLANT
GOWN STRL REUS W/TWL LRG LVL3 (GOWN DISPOSABLE) ×10
IRRIGATION STRYKERFLOW (MISCELLANEOUS) ×2 IMPLANT
IRRIGATOR STRYKERFLOW (MISCELLANEOUS) ×4
IV CATH ANGIO 12GX3 LT BLUE (NEEDLE) ×4 IMPLANT
IV NS 1000ML (IV SOLUTION) ×2
IV NS 1000ML BAXH (IV SOLUTION) ×2 IMPLANT
JACKSON PRATT 10 (INSTRUMENTS) IMPLANT
L-HOOK LAP DISP 36CM (ELECTROSURGICAL) ×4
LABEL OR SOLS (LABEL) ×4 IMPLANT
LHOOK LAP DISP 36CM (ELECTROSURGICAL) ×2 IMPLANT
NEEDLE HYPO 22GX1.5 SAFETY (NEEDLE) ×4 IMPLANT
PACK LAP CHOLECYSTECTOMY (MISCELLANEOUS) ×4 IMPLANT
PENCIL ELECTRO HAND CTR (MISCELLANEOUS) ×4 IMPLANT
POUCH SPECIMEN RETRIEVAL 10MM (ENDOMECHANICALS) ×4 IMPLANT
SCISSORS METZENBAUM CVD 33 (INSTRUMENTS) ×4 IMPLANT
SET TUBE SMOKE EVAC HIGH FLOW (TUBING) ×4 IMPLANT
SLEEVE ADV FIXATION 5X100MM (TROCAR) ×12 IMPLANT
SPONGE VERSALON 4X4 4PLY (MISCELLANEOUS) IMPLANT
SUT MNCRL 4-0 (SUTURE) ×4
SUT MNCRL 4-0 27XMFL (SUTURE) ×4
SUT VIC AB 3-0 SH 27 (SUTURE) ×2
SUT VIC AB 3-0 SH 27X BRD (SUTURE) ×2 IMPLANT
SUT VICRYL 0 AB UR-6 (SUTURE) ×4 IMPLANT
SUTURE MNCRL 4-0 27XMF (SUTURE) ×4 IMPLANT
TROCAR BALLN GELPORT 12X130M (ENDOMECHANICALS) ×4 IMPLANT
TROCAR Z-THREAD OPTICAL 5X100M (TROCAR) ×4 IMPLANT

## 2019-09-02 NOTE — Op Note (Signed)
Procedure Date:  09/02/2019  Pre-operative Diagnosis:   Gallstone pancreatitis and choledocholithiasis  Post-operative Diagnosis:  Gallstone pancreatitis and choledocholithiasis  Procedure:  Laparoscopic cholecystectomy with intraoperative cholangiogram  Surgeon:  Howie Ill, MD  Assistant:  Sofie Rower, PA-S  Anesthesia:  General endotracheal  Estimated Blood Loss:  15 ml  Specimens:  gallbladder  Complications:  None  Indications for Procedure:  This is a 85 y.o. female who presents with abdominal pain and workup revealing gallstone pancreatitis and choledocholithiasis, s/p ERCP on 08/08/19.  She now presents for cholecystectomy.  The benefits, complications, treatment options, and expected outcomes were discussed with the patient. The risks of bleeding, infection, recurrence of symptoms, failure to resolve symptoms, bile duct damage, bile duct leak, retained common bile duct stone, bowel injury, and need for further procedures were all discussed with the patient and she was willing to proceed.  Description of Procedure: The patient was correctly identified in the preoperative area and brought into the operating room.  The patient was placed supine with VTE prophylaxis in place.  Appropriate time-outs were performed.  Anesthesia was induced and the patient was intubated.  Appropriate antibiotics were infused.  The abdomen was prepped and draped in a sterile fashion. An infraumbilical incision was made. A cutdown technique was used to enter the abdominal cavity without injury, and a Hasson trocar was inserted.  Pneumoperitoneum was obtained with appropriate opening pressures.  A 5-mm port was placed in the subxiphoid area and two 5-mm ports were placed in the right upper quadrant under direct visualization.  The gallbladder was identified.  The fundus was grasped and retracted cephalad.  Adhesions were lysed bluntly and with electrocautery. The infundibulum was grasped and  retracted laterally, exposing the peritoneum overlying the gallbladder.  This was incised with electrocautery and extended on either side of the gallbladder.  The cystic duct and cystic artery were clearly identified and bluntly dissected.  The cystic duct was clipped once distally and a ductotomy was created.  A 14Fr angiocath was placed in the right upper quadrant and the cholangiogram catheter passed through it.  Using Maryland forceps, the catheter was introduced into the ductotomy and secured with a clip.  The C-arm was then brought into the field and a cholangiogram was performed showing a patent CBD without any filling defects, with contrast flowing easily into the duodenum.    After completion of the cholangiogram, the catheter was removed and the cystic duct was clipped twice proximally and cut in between.  The cystic artery was clipped and cut in same fashion.  In doing so, there was some bleeding from the cystic artery which was controlled easily with a clip. The gallbladder was taken from the gallbladder fossa in a retrograde fashion with electrocautery. The gallbladder was placed in an Endocatch bag. The liver bed was inspected and any bleeding was controlled with electrocautery. The right upper quadrant was then inspected again revealing intact clips, no bleeding, and no ductal injury.  The area was thoroughly irrigated.  The 5 mm ports were removed under direct visualization and the Hasson trocar was removed.  The Endocatch bag and brought out via the umbilical incision.  The fascial opening was closed using 0 vicryl suture.  Local anesthetic was infused in all incisions and the incisions were closed with 4-0 Monocryl.  The wounds were cleaned and sealed with DermaBond.  The patient was emerged from anesthesia and extubated and brought to the recovery room for further management.  The patient tolerated the  procedure well and all counts were correct at the end of the case.   Melvyn Neth, MD

## 2019-09-02 NOTE — Anesthesia Preprocedure Evaluation (Signed)
Anesthesia Evaluation  Patient identified by MRN, date of birth, ID band Patient awake    Reviewed: Allergy & Precautions, H&P , NPO status , Patient's Chart, lab work & pertinent test results  Airway Mallampati: III  TM Distance: <3 FB Neck ROM: limited    Dental  (+) Poor Dentition, Edentulous Upper, Edentulous Lower   Pulmonary asthma ,           Cardiovascular Exercise Tolerance: Good hypertension, (-) angina+ CAD, + Past MI and + Cardiac Stents  (-) DOE      Neuro/Psych negative neurological ROS  negative psych ROS   GI/Hepatic negative GI ROS, Neg liver ROS, neg GERD  ,  Endo/Other  diabetes, Type 2  Renal/GU      Musculoskeletal   Abdominal   Peds  Hematology negative hematology ROS (+)   Anesthesia Other Findings Past Medical History: No date: Aortic insufficiency     Comment:  a. TTE 12/19: EF 55-60%, probable HK of the mid apical               anterior septal myocardium, Gr1DD, mild AI, mildly               dilated LA No date: Asthma No date: CAD (coronary artery disease)     Comment:  a. NSTEMI 12/19; b. LHC 10/08/18: LM minimal luminal               irregs, mLAD-1 95% s/p PCI/DES, mLAD-2 60%, LCx mild               diffuse disease throughout, RCA minimal luminal irregs No date: Diabetes mellitus (HCC) No date: Hypercholesterolemia No date: Hypertension No date: Myocardial infarction (HCC) No date: Osteopenia No date: Polymyalgia rheumatica syndrome (HCC) No date: Reactive airway disease  Past Surgical History: 1981: ABDOMINAL HYSTERECTOMY     Comment:  prolapse and bleeding, ovaries not removed No date: BREAST EXCISIONAL BIOPSY; Right 10/08/2018: CORONARY STENT INTERVENTION; N/A     Comment:  Procedure: CORONARY STENT INTERVENTION;  Surgeon: Iran Ouch, MD;  Location: [facility] INVASIVE CV [facility];                Service: Cardiovascular;  Laterality: N/A; 08/08/2019: ENDOSCOPIC  RETROGRADE CHOLANGIOPANCREATOGRAPHY (ERCP) WITH  PROPOFOL; N/A     Comment:  Procedure: ENDOSCOPIC RETROGRADE               CHOLANGIOPANCREATOGRAPHY (ERCP) WITH PROPOFOL;  Surgeon:               Midge Minium, MD;  Location: [facility] ENDOSCOPY;  Service:               Endoscopy;  Laterality: N/A; 10/08/2018: LEFT HEART CATH AND CORONARY ANGIOGRAPHY; N/A     Comment:  Procedure: LEFT HEART CATH AND CORONARY ANGIOGRAPHY;                Surgeon: Iran Ouch, MD;  Location: [facility] INVASIVE               CV LAB;  Service: Cardiovascular;  Laterality: N/A; 7/94: UMBILICAL HERNIA REPAIR     Reproductive/Obstetrics negative OB ROS                             Anesthesia Physical Anesthesia Plan  ASA: III  Anesthesia Plan: General ETT   Post-op Pain Management:    Induction:  Intravenous  PONV Risk Score and Plan: Ondansetron, Dexamethasone, Midazolam and Treatment may vary due to age or medical condition  Airway Management Planned: Oral ETT  Additional Equipment:   Intra-op Plan:   Post-operative Plan: Extubation in OR  Informed Consent: I have reviewed the patients History and Physical, chart, labs and discussed the procedure including the risks, benefits and alternatives for the proposed anesthesia with the patient or authorized representative who has indicated his/her understanding and acceptance.     Dental Advisory Given  Plan Discussed with: Anesthesiologist, CRNA and Surgeon  Anesthesia Plan Comments: (Patient consented for risks of anesthesia including but not limited to:  - adverse reactions to medications - damage to teeth, lips or other oral mucosa - sore throat or hoarseness - Damage to heart, brain, lungs or loss of life  Patient voiced understanding.)        Anesthesia Quick Evaluation

## 2019-09-02 NOTE — Progress Notes (Signed)
Pt CBG in PACU: 179. Pt c/o right shoulder pain that is relieved with heat and Fentanyl. Dr. Amie Critchley notified. Acknowledged. No new orders.

## 2019-09-02 NOTE — Anesthesia Procedure Notes (Signed)
Procedure Name: Intubation Date/Time: 09/02/2019 10:52 AM Performed by: Jonna Clark, CRNA Pre-anesthesia Checklist: Patient identified, Patient being monitored, Timeout performed, Emergency Drugs available and Suction available Patient Re-evaluated:Patient Re-evaluated prior to induction Oxygen Delivery Method: Circle system utilized Preoxygenation: Pre-oxygenation with 100% oxygen Induction Type: IV induction Ventilation: Mask ventilation without difficulty Laryngoscope Size: 3 and McGraph Grade View: Grade I Tube type: Oral Tube size: 7.0 mm Number of attempts: 1 Airway Equipment and Method: Stylet Placement Confirmation: ETT inserted through vocal cords under direct vision,  positive ETCO2 and breath sounds checked- equal and bilateral Secured at: 20 cm Tube secured with: Tape Dental Injury: Teeth and Oropharynx as per pre-operative assessment

## 2019-09-02 NOTE — Interval H&P Note (Signed)
History and Physical Interval Note:  09/02/2019 10:16 AM  Hunter  has presented today for surgery, with the diagnosis of gallstone pancreatitis.  The various methods of treatment have been discussed with the patient and family. After consideration of risks, benefits and other options for treatment, the patient has consented to  Procedure(s): LAPAROSCOPIC CHOLECYSTECTOMY WITH INTRAOPERATIVE CHOLANGIOGRAM PEDIATRIC (N/A) as a surgical intervention.  The patient's history has been reviewed, patient examined, no change in status, stable for surgery.  I have reviewed the patient's chart and labs.  Questions were answered to the patient's satisfaction.     Saajan Willmon

## 2019-09-02 NOTE — Anesthesia Post-op Follow-up Note (Signed)
Anesthesia QCDR form completed.        

## 2019-09-02 NOTE — Discharge Instructions (Signed)

## 2019-09-02 NOTE — Anesthesia Postprocedure Evaluation (Signed)
Anesthesia Post Note  Patient: [place]  Procedure(s) Performed: LAPAROSCOPIC CHOLECYSTECTOMY WITH INTRAOPERATIVE CHOLANGIOGRAM PEDIATRIC (N/A )  Patient location during evaluation: Phase II Anesthesia Type: General Level of consciousness: awake and alert Pain management: pain level controlled Vital Signs Assessment: post-procedure vital signs reviewed and stable Respiratory status: spontaneous breathing, nonlabored ventilation, respiratory function stable and patient connected to nasal cannula oxygen Cardiovascular status: blood pressure returned to baseline and stable Postop Assessment: no apparent nausea or vomiting Anesthetic complications: no Comments: Patient was endorsing some right shoulder pain which has since resolved.  No chest pain or pressure.  No left shoulder or arm pain.  No nausea or dyspnea.  Patient is hemodynamically stable.  Pain was worse with movement and palpation. Pain was thought to be musculoskeletal in nature.  Patient instructed to contact a physician if her shoulder pain gets worse, does not resolve, she has any other symptoms or she has any other concerns.  Patient voiced understanding.     Last Vitals:  Vitals:   09/02/19 1319 09/02/19 1337  BP: (!) 113/54 (!) 124/44  Pulse: 69 70  Resp: 13 16  Temp: 36.5 C (!) 36.3 C  SpO2: 94% 95%    Last Pain:  Vitals:   09/02/19 1337  TempSrc: Temporal  PainSc: [place]

## 2019-09-02 NOTE — Transfer of Care (Signed)
Immediate Anesthesia Transfer of Care Note  Patient: [place]  Procedure(s) Performed: LAPAROSCOPIC CHOLECYSTECTOMY WITH INTRAOPERATIVE CHOLANGIOGRAM PEDIATRIC (N/A )  Patient Location: PACU  Anesthesia Type:General  Level of Consciousness: drowsy and patient cooperative  Airway & Oxygen Therapy: Patient Spontanous Breathing and Patient connected to face mask oxygen  Post-op Assessment: Report given to RN and Post -op Vital signs reviewed and stable  Post vital signs: Reviewed and stable  Last Vitals:  Vitals Value Taken Time  BP    Temp    Pulse 80 09/02/19 1237  Resp 13 09/02/19 1237  SpO2 99 % 09/02/19 1237  Vitals shown include unvalidated device data.  Last Pain:  Vitals:   09/02/19 1000  TempSrc: Tympanic         Complications: No apparent anesthesia complications

## 2019-09-02 NOTE — Telephone Encounter (Signed)
Copied from [id number]. Topic: General - Other >> Sep 02, 2019  2:52 PM Oneta Rack wrote: [name] would like to speak with nurse or PCP regarding how patient is doing after her surgery best # [phone]

## 2019-09-03 ENCOUNTER — Encounter: Payer: Self-pay | Admitting: Surgery

## 2019-09-03 LAB — SURGICAL PATHOLOGY

## 2019-09-03 NOTE — Telephone Encounter (Signed)
Left message for daughter to call me back 

## 2019-09-03 NOTE — Telephone Encounter (Signed)
Pts daughter called back. Attempted to reach office 3x. Please advise.

## 2019-09-04 NOTE — Telephone Encounter (Signed)
pts daughter just calling to give update on pt after surgery. Pt did well and is at home recovering. Advised to let us know if they need anything.

## 2019-09-04 NOTE — Telephone Encounter (Signed)
Olin Hauser states she will be home until 10:30 this am if you can call.  [phone]

## 2019-09-16 ENCOUNTER — Telehealth: Payer: Self-pay | Admitting: *Deleted

## 2019-09-16 NOTE — Telephone Encounter (Signed)
Patients lab appt has been moved.

## 2019-09-16 NOTE — Telephone Encounter (Signed)
Pt daughter is waiting on a call -back.

## 2019-09-16 NOTE — Telephone Encounter (Signed)
Copied from [id number]. Topic: Appointment Scheduling - Scheduling Inquiry for Clinic >> Sep 16, 2019  8:30 AM Nils Flack wrote: Reason for CRM: pts daughter called.  She would like to know if pt can have her labs when she comes in tomorrow for her b12 shot.  Pt will not have transportation for her 09/24/19 lab appt.  Please call daughter at [phone]

## 2019-09-17 ENCOUNTER — Other Ambulatory Visit: Payer: Self-pay

## 2019-09-17 ENCOUNTER — Ambulatory Visit (INDEPENDENT_AMBULATORY_CARE_PROVIDER_SITE_OTHER): Payer: Medicare HMO | Admitting: Physician Assistant

## 2019-09-17 ENCOUNTER — Encounter (INDEPENDENT_AMBULATORY_CARE_PROVIDER_SITE_OTHER): Payer: Self-pay

## 2019-09-17 ENCOUNTER — Other Ambulatory Visit (INDEPENDENT_AMBULATORY_CARE_PROVIDER_SITE_OTHER): Payer: Medicare HMO

## 2019-09-17 ENCOUNTER — Encounter: Payer: Self-pay | Admitting: Physician Assistant

## 2019-09-17 ENCOUNTER — Ambulatory Visit (INDEPENDENT_AMBULATORY_CARE_PROVIDER_SITE_OTHER): Payer: Medicare HMO

## 2019-09-17 VITALS — BP 148/83 | HR 75 | Temp 97.9°F | Resp 14 | Ht 61.0 in | Wt 115.0 lb

## 2019-09-17 DIAGNOSIS — E7800 Pure hypercholesterolemia, unspecified: Secondary | ICD-10-CM | POA: Diagnosis not present

## 2019-09-17 DIAGNOSIS — I10 Essential (primary) hypertension: Secondary | ICD-10-CM

## 2019-09-17 DIAGNOSIS — E538 Deficiency of other specified B group vitamins: Secondary | ICD-10-CM

## 2019-09-17 DIAGNOSIS — Z09 Encounter for follow-up examination after completed treatment for conditions other than malignant neoplasm: Secondary | ICD-10-CM

## 2019-09-17 DIAGNOSIS — E1159 Type 2 diabetes mellitus with other circulatory complications: Secondary | ICD-10-CM

## 2019-09-17 DIAGNOSIS — K851 Biliary acute pancreatitis without necrosis or infection: Secondary | ICD-10-CM

## 2019-09-17 MED ORDER — CYANOCOBALAMIN 1000 MCG/ML IJ SOLN
1000.0000 ug | Freq: Once | INTRAMUSCULAR | Status: AC
  Administered 2019-09-17: 16:00:00 1000 ug via INTRAMUSCULAR

## 2019-09-17 NOTE — Patient Instructions (Signed)

## 2019-09-17 NOTE — Progress Notes (Addendum)
Tanya Cole presents today for injection per MD orders. B12 injection administered IM in left Upper Arm. Administration without incident. Patient tolerated well.  [name],cma   Reviewed.  Dr Scott 

## 2019-09-17 NOTE — Progress Notes (Signed)
[facility] POST-OP OFFICE VISIT  09/17/2019  HPI: Tanya Cole is a 85 y.o. female 15 days s/p laparoscopic cholecystectomy with IOC with Dr Hampton Abbot for gallstone pancreatitis.   Today, she presents with her daughter. Umbilical soreness but is not requiring pain medications. No fever, chills, nausea or emesis. Tolerating PO. No diarrhea. Mobilizing. No further issues.   Vital signs: BP (!) 148/83   Pulse 75   Temp 97.9 F (36.6 C) (Temporal)   Resp 14   Ht 5\' 1"  (1.549 m)   Wt 115 lb (52.2 kg)   SpO2 97%   BMI 21.73 kg/m    Physical Exam: Constitutional: Well appearing female, NAD Abdomen: Soft, non-tender, non-distended, no rebound or guarding Skin: Laparoscopic incisions are CDI, no erythema, no drainage. Well healed  Assessment/Plan: This is a 85 y.o. female 15 days s/p laparoscopic cholecystectomy with IOC   - Pain control prn  - okay to submerge wounds  - complete lifting restrictions  - Reviewed pathology: Chronic cholecystitis  - rtc prn, reviewed return precautions  -- Edison Simon, PA-C [facility] 09/17/2019, 9:27 AM [phone] M-F: 7am - 4pm

## 2019-09-18 LAB — BASIC METABOLIC PANEL
BUN: 22 mg/dL (ref 6–23)
CO2: 26 mEq/L (ref 19–32)
Calcium: 10.2 mg/dL (ref 8.4–10.5)
Chloride: 102 mEq/L (ref 96–112)
Creatinine, Ser: 0.65 mg/dL (ref 0.40–1.20)
GFR: 86.78 mL/min (ref >60.00)
Glucose, Bld: 97 mg/dL (ref 70–99)
Potassium: 4.7 mEq/L (ref 3.5–5.1)
Sodium: 140 mEq/L (ref 135–145)

## 2019-09-18 LAB — HEPATIC FUNCTION PANEL
ALT: 10 U/L (ref 0–35)
AST: 13 U/L (ref 0–37)
Albumin: 4.5 g/dL (ref 3.5–5.2)
Alkaline Phosphatase: 82 U/L (ref 39–117)
Bilirubin, Direct: 0.1 mg/dL (ref 0.0–0.3)
Total Bilirubin: 0.5 mg/dL (ref 0.2–1.2)
Total Protein: 7.4 g/dL (ref 6.0–8.3)

## 2019-09-18 LAB — LIPID PANEL
Cholesterol: 162 mg/dL (ref 0–200)
HDL: 44.3 mg/dL (ref >39.00)
NonHDL: 117.32
Total CHOL/HDL Ratio: 4
Triglycerides: 207 mg/dL — ABNORMAL HIGH (ref 0.0–149.0)
VLDL: 41.4 mg/dL — ABNORMAL HIGH (ref 0.0–40.0)

## 2019-09-18 LAB — TSH: TSH: 3.59 u[IU]/mL (ref 0.35–4.50)

## 2019-09-18 LAB — HEMOGLOBIN A1C: Hgb A1c MFr Bld: 6.3 % (ref 4.6–6.5)

## 2019-09-18 LAB — LDL CHOLESTEROL, DIRECT: Direct LDL: 90 mg/dL

## 2019-09-23 ENCOUNTER — Encounter: Payer: Self-pay | Admitting: Surgery

## 2019-09-24 ENCOUNTER — Other Ambulatory Visit: Payer: Medicare HMO

## 2019-09-27 ENCOUNTER — Other Ambulatory Visit: Payer: Medicare Other

## 2019-09-30 ENCOUNTER — Encounter: Payer: Self-pay | Admitting: Internal Medicine

## 2019-09-30 ENCOUNTER — Other Ambulatory Visit: Payer: Self-pay

## 2019-09-30 ENCOUNTER — Ambulatory Visit (INDEPENDENT_AMBULATORY_CARE_PROVIDER_SITE_OTHER): Payer: Medicare HMO | Admitting: Internal Medicine

## 2019-09-30 VITALS — BP 150/60 | HR 73 | Temp 96.8°F | Ht 59.45 in | Wt 115.0 lb

## 2019-09-30 DIAGNOSIS — R05 Cough: Secondary | ICD-10-CM | POA: Diagnosis not present

## 2019-09-30 DIAGNOSIS — I10 Essential (primary) hypertension: Secondary | ICD-10-CM | POA: Diagnosis not present

## 2019-09-30 DIAGNOSIS — E1159 Type 2 diabetes mellitus with other circulatory complications: Secondary | ICD-10-CM

## 2019-09-30 DIAGNOSIS — Z0000 Encounter for general adult medical examination without abnormal findings: Secondary | ICD-10-CM

## 2019-09-30 DIAGNOSIS — D72829 Elevated white blood cell count, unspecified: Secondary | ICD-10-CM | POA: Diagnosis not present

## 2019-09-30 DIAGNOSIS — E7800 Pure hypercholesterolemia, unspecified: Secondary | ICD-10-CM

## 2019-09-30 DIAGNOSIS — Z0001 Encounter for general adult medical examination with abnormal findings: Secondary | ICD-10-CM

## 2019-09-30 DIAGNOSIS — R059 Cough, unspecified: Secondary | ICD-10-CM | POA: Insufficient documentation

## 2019-09-30 DIAGNOSIS — I2510 Atherosclerotic heart disease of native coronary artery without angina pectoris: Secondary | ICD-10-CM | POA: Diagnosis not present

## 2019-09-30 NOTE — Progress Notes (Signed)
[name], female   DOB: [birth year], 85 y.o.   MRN: [id number]   Subjective:    [name], female    DOB: Jan 25, 1933, 85 y.o.   MRN: [id number]  HPI  Patient here for her physical exam.  She also reports having some issues with cough and minimal congestion.  She reports that overall she has been doing relatively well.  No chest pain.  No sob.  No chest tightness or wheezing.  No fever.  No sinus congestion.  No acid reflux.  No abdominal pain.  Bowels moving.  Handling stress.  Overall she feels she is doing relatively well.     Past Medical History:  Diagnosis Date  . Aortic insufficiency    a. TTE 12/19: EF 55-60%, probable HK of the mid apical anterior septal myocardium, Gr1DD, mild AI, mildly dilated LA  . Asthma   . CAD (coronary artery disease)    a. NSTEMI 12/19; b. LHC 10/08/18: LM minimal luminal irregs, mLAD-1 95% s/p PCI/DES, mLAD-2 60%, LCx mild diffuse disease throughout, RCA minimal luminal irregs  . Diabetes mellitus ([place])   . Hypercholesterolemia   . Hypertension   . Myocardial infarction ([place])   . Osteopenia   . Polymyalgia rheumatica syndrome ([place])   . Reactive airway disease    Past Surgical History:  Procedure Laterality Date  . ABDOMINAL HYSTERECTOMY  1981   prolapse and bleeding, ovaries not removed  . BREAST EXCISIONAL BIOPSY Right   . CHOLECYSTECTOMY N/A 09/02/2019   Procedure: LAPAROSCOPIC CHOLECYSTECTOMY WITH INTRAOPERATIVE CHOLANGIOGRAM;  Surgeon: Olean Ree, MD;  Location: [facility];  Service: General;  Laterality: N/A;  . CORONARY STENT INTERVENTION N/A 10/08/2018   Procedure: CORONARY STENT INTERVENTION;  Surgeon: Wellington Hampshire, MD;  Location: [place] [facility];  Service: Cardiovascular;  Laterality: N/A;  . ENDOSCOPIC RETROGRADE CHOLANGIOPANCREATOGRAPHY (ERCP) WITH PROPOFOL N/A 08/08/2019   Procedure: ENDOSCOPIC RETROGRADE CHOLANGIOPANCREATOGRAPHY (ERCP) WITH PROPOFOL;  Surgeon: Lucilla Lame, MD;  Location:  [facility];  Service: Endoscopy;  Laterality: N/A;  . LEFT HEART CATH AND CORONARY ANGIOGRAPHY N/A 10/08/2018   Procedure: LEFT HEART CATH AND CORONARY ANGIOGRAPHY;  Surgeon: Wellington Hampshire, MD;  Location: [place] [facility];  Service: Cardiovascular;  Laterality: N/A;  . UMBILICAL HERNIA REPAIR  7/94   Family History  Problem Relation Age of Onset  . Heart attack Father   . Arthritis Mother   . Heart disease Mother   . Throat cancer Sister   . Parkinson's disease Sister   . COPD Brother    Social History   Socioeconomic History  . Marital status: Widowed    Spouse name: Not on file  . Number of children: 3  . Years of education: Not on file  . Highest education level: Not on file  Occupational History  . Not on file  Social Needs  . Financial resource strain: Not hard at all  . Food insecurity    Worry: Never true    Inability: Never true  . Transportation needs    Medical: No    Non-medical: No  Tobacco Use  . Smoking status: Never Smoker  . Smokeless tobacco: Never Used  Substance and Sexual Activity  . Alcohol use: No    Alcohol/week: 0.0 standard drinks  . Drug use: No  . Sexual activity: Not Currently  Lifestyle  . Physical activity    Days per week: 2 days    Minutes per session: 10 min  . Stress: Not  at all  Relationships  . Social Herbalist on phone: Not on file    Gets together: Not on file    Attends religious service: Not on file    Active member of club or organization: Not on file    Attends meetings of clubs or organizations: Not on file    Relationship status: Not on file  Other Topics Concern  . Not on file  Social History Narrative  . Not on file    Outpatient Encounter Medications as of 09/30/2019  Medication Sig  . ADVAIR DISKUS 250-50 MCG/DOSE AEPB INHALE ONE PUFF BY MOUTH EVERY 12 HOURS. RINSE MOUTH AFTER EACH USE (Patient taking differently: Inhale 1 puff into the lungs 2 (two) times daily. )  . alendronate  (FOSAMAX) 70 MG tablet Take 70 mg by mouth once a week.   Marland Kitchen aspirin 81 MG tablet Take 81 mg by mouth daily.  Marland Kitchen atorvastatin (LIPITOR) 40 MG tablet Take 1 tablet (40 mg total) by mouth daily at 6 PM.  . clopidogrel (PLAVIX) 75 MG tablet Take 1 tablet (75 mg total) by mouth daily with breakfast.  . cyanocobalamin (,VITAMIN B-12,) 1000 MCG/ML injection Inject 1,000 mcg into the muscle every 30 (thirty) days.   . fluticasone (FLONASE) 50 MCG/ACT nasal spray USE 2 SPRAY(S) IN EACH NOSTRIL ONCE DAILY (Patient taking differently: Place 1 spray into both nostrils daily. )  . ibuprofen (ADVIL) 400 MG tablet Take 1 tablet (400 mg total) by mouth every 8 (eight) hours as needed for mild pain or moderate pain.  Marland Kitchen lisinopril (ZESTRIL) 20 MG tablet Take 1 tablet (20 mg total) by mouth daily.  . metFORMIN (GLUCOPHAGE) 500 MG tablet TAKE 1 TABLET BY MOUTH TWICE DAILY WITH A MEAL (Patient taking differently: Take 500 mg by mouth 2 (two) times daily with a meal. )  . metoprolol tartrate (LOPRESSOR) 25 MG tablet Take 1 tablet (25 mg total) by mouth 2 (two) times daily. (Patient taking differently: Take 12.5 mg by mouth 2 (two) times daily. )  . Multiple Vitamins-Minerals (VISION FORMULA/LUTEIN PO) Take by mouth daily.  . nitroGLYCERIN (NITROSTAT) 0.4 MG SL tablet Place 1 tablet (0.4 mg total) under the tongue every 5 (five) minutes as needed for chest pain.  Marland Kitchen PROAIR HFA 108 (90 Base) MCG/ACT inhaler Inhale 2 puffs into the lungs every 6 (six) hours as needed. (Patient taking differently: Inhale 2 puffs into the lungs every 6 (six) hours as needed for wheezing or shortness of breath. )  . Vitamin D, Ergocalciferol, (DRISDOL) 50000 units CAPS capsule Take 50,000 Units by mouth once a week.   No facility-administered encounter medications on file as of 09/30/2019.    Review of Systems  Constitutional: Negative for appetite change and unexpected weight change.  HENT: Negative for congestion and sinus pressure.   Eyes:  Negative for pain and visual disturbance.  Respiratory: Positive for cough. Negative for chest tightness, shortness of breath and wheezing.   Cardiovascular: Negative for chest pain, palpitations and leg swelling.  Gastrointestinal: Negative for abdominal pain, diarrhea, nausea and vomiting.  Genitourinary: Negative for difficulty urinating and dysuria.  Musculoskeletal: Negative for joint swelling and myalgias.  Skin: Negative for color change and rash.  Neurological: Negative for dizziness, light-headedness and headaches.  Hematological: Negative for adenopathy. Does not bruise/bleed easily.  Psychiatric/Behavioral: Negative for agitation and dysphoric mood.       Objective:    Physical Exam Constitutional:      General: She is not  in acute distress.    Appearance: Normal appearance. She is well-developed.  HENT:     Head: Normocephalic and atraumatic.     Right Ear: External ear normal.     Left Ear: External ear normal.  Eyes:     General: No scleral icterus.       Right eye: No discharge.        Left eye: No discharge.     Conjunctiva/sclera: Conjunctivae normal.  Neck:     Musculoskeletal: Neck supple. No muscular tenderness.     Thyroid: No thyromegaly.  Cardiovascular:     Rate and Rhythm: Normal rate and regular rhythm.  Pulmonary:     Effort: No tachypnea, accessory muscle usage or respiratory distress.     Breath sounds: Normal breath sounds. No decreased breath sounds or wheezing.  Chest:     Breasts:        Right: No inverted nipple, mass, nipple discharge or tenderness (no axillary adenopathy).        Left: No inverted nipple, mass, nipple discharge or tenderness (no axilarry adenopathy).  Abdominal:     General: Bowel sounds are normal.     Palpations: Abdomen is soft.     Tenderness: There is no abdominal tenderness.  Musculoskeletal:        General: No swelling or tenderness.  Lymphadenopathy:     Cervical: No cervical adenopathy.  Skin:    Findings:  No erythema or rash.  Neurological:     Mental Status: She is alert and oriented to person, place, and time.  Psychiatric:        Mood and Affect: Mood normal.        Behavior: Behavior normal.     BP (!) 150/60 (BP Location: Left Arm, Patient Position: Sitting, Cuff Size: Normal)   Pulse 73   Temp (!) 96.8 F (36 C) (Temporal)   Ht 4' 11.45" (1.51 m)   Wt 115 lb (52.2 kg)   SpO2 95%   BMI 22.88 kg/m  Wt Readings from Last 3 Encounters:  09/30/19 115 lb (52.2 kg)  09/17/19 115 lb (52.2 kg)  08/28/19 115 lb 1.3 oz (52.2 kg)     Lab Results  Component Value Date   WBC 18.0 (H) 08/15/2019   HGB 12.3 08/15/2019   HCT 37.7 08/15/2019   PLT 419 (H) 08/15/2019   GLUCOSE 97 09/17/2019   CHOL 162 09/17/2019   TRIG 207.0 (H) 09/17/2019   HDL 44.30 09/17/2019   LDLDIRECT 90.0 09/17/2019   LDLCALC 84 05/24/2019   ALT 10 09/17/2019   AST 13 09/17/2019   NA 140 09/17/2019   K 4.7 09/17/2019   CL 102 09/17/2019   CREATININE 0.65 09/17/2019   BUN 22 09/17/2019   CO2 26 09/17/2019   TSH 3.59 09/17/2019   HGBA1C 6.3 09/17/2019   MICROALBUR 4.3 (H) 05/24/2019    No results found.     Assessment & Plan:   Problem List Items Addressed This Visit    CAD (coronary artery disease)    S/p PCI/DES - mid LAD.  Followed by cardiology.  Currently doing well.  No chest pain.  Continue risk factor modification.       Cough    With minimal cough and congestion.  Symptoms just started over the last couple of days.  Was with her family over Thanksgiving.  Will check covid.  Discussed self quarantine.  Robitussin as directed.  Continue inhalers.  Hold abx and prednisone.  Follow closely.  Call with update.        Relevant Orders   Novel Coronavirus, NAA ([facility])   Diabetes mellitus with cardiac complication (HCC)    Low carb diet and exercise.  Follow met b and a1c.       Relevant Orders   Hemoglobin A1c   Hypercholesterolemia    On lipitor.  Low cholesterol diet and exercise.   Follow lipid panel and liver function tests.        Relevant Orders   Hepatic function panel   Lipid panel   Hypertension    Blood pressure has been well controlled.  Elevated today.  Recheck improved.  Have her spot check her pressure.  Send in readings.  Follow metabolic panel.       Relevant Orders   Basic metabolic panel (future)    Other Visit Diagnoses    Routine general medical examination at a health care facility    -  Primary   Leukocytosis, unspecified type       Relevant Orders   CBC with Differential/Platelet       Einar Pheasant, MD

## 2019-10-03 ENCOUNTER — Telehealth: Payer: Self-pay

## 2019-10-03 MED ORDER — PREDNISONE 10 MG PO TABS: ORAL_TABLET | ORAL | 0 refills | Status: DC

## 2019-10-03 NOTE — Telephone Encounter (Signed)
Noted  

## 2019-10-03 NOTE — Telephone Encounter (Signed)
Given her history and persistent cough, I can call in prednisone taper.  Confirm with pt she has no problems taking prednisone.  Also, let her know that it will increase her blood sugar.  If ok with taking, let me know and I will send in a prednisone taper.

## 2019-10-03 NOTE — Telephone Encounter (Signed)
She did not go get tested. She is going to try to get someone to take her tomorrow. She stated she is not having any other symptoms (fever, chills, SOB, wheezing, etc.) She just has a nagging cough and stated she gets this way about every year around this time. Stated that she has a history of bronchitis so does not want it to get worse.

## 2019-10-03 NOTE — Telephone Encounter (Signed)
Patient is okay to take prednisone taper. She would like sent in to [facility] on [address].

## 2019-10-03 NOTE — Telephone Encounter (Signed)
Copied from [place] [phone]. Topic: General - Other >> Oct 03, 2019 10:08 AM Jodie Echevaria wrote: Reason for CRM: Patient called to inform Dr Nicki Reaper that she is still coughing and per Dr Lars Mage instructions she is calling to inform so that she can have something called in to the pharmacy please. Please contact patient when this is done at Ph# [phone]

## 2019-10-03 NOTE — Telephone Encounter (Signed)
Pt needing something sent in for cough. She was seen on 11/30.

## 2019-10-03 NOTE — Telephone Encounter (Signed)
Need to know specific symptoms.  Is she having coughing fits?  Wheezing?  Any fever or sob?  It looks like she never went for her covid test?

## 2019-10-03 NOTE — Telephone Encounter (Signed)
Prednisone sent into pharmacy.  

## 2019-10-05 ENCOUNTER — Encounter: Payer: Self-pay | Admitting: Internal Medicine

## 2019-10-05 NOTE — Assessment & Plan Note (Signed)
Blood pressure has been well controlled.  Elevated today.  Recheck improved.  Have her spot check her pressure.  Send in readings.  Follow metabolic panel.

## 2019-10-05 NOTE — Assessment & Plan Note (Signed)
On lipitor.  Low cholesterol diet and exercise.  Follow lipid panel and liver function tests.   

## 2019-10-05 NOTE — Assessment & Plan Note (Signed)
With minimal cough and congestion.  Symptoms just started over the last couple of days.  Was with her family over Thanksgiving.  Will check covid.  Discussed self quarantine.  Robitussin as directed.  Continue inhalers.  Hold abx and prednisone.  Follow closely.  Call with update.

## 2019-10-05 NOTE — Assessment & Plan Note (Signed)
S/p PCI/DES - mid LAD.  Followed by cardiology.  Currently doing well.  No chest pain.  Continue risk factor modification.

## 2019-10-05 NOTE — Assessment & Plan Note (Signed)
Low carb diet and exercise.  Follow met b and a1c.  

## 2019-10-17 ENCOUNTER — Ambulatory Visit: Payer: Medicare HMO

## 2019-10-22 ENCOUNTER — Ambulatory Visit: Payer: Medicare HMO

## 2019-11-03 DIAGNOSIS — Z20828 Contact with and (suspected) exposure to other viral communicable diseases: Secondary | ICD-10-CM | POA: Diagnosis not present

## 2019-11-03 DIAGNOSIS — R05 Cough: Secondary | ICD-10-CM | POA: Diagnosis not present

## 2019-11-03 DIAGNOSIS — J441 Chronic obstructive pulmonary disease with (acute) exacerbation: Secondary | ICD-10-CM | POA: Diagnosis not present

## 2019-11-07 ENCOUNTER — Ambulatory Visit: Payer: Medicare HMO

## 2019-11-14 ENCOUNTER — Other Ambulatory Visit: Payer: Self-pay | Admitting: Internal Medicine

## 2019-11-18 ENCOUNTER — Other Ambulatory Visit: Payer: Self-pay | Admitting: Physician Assistant

## 2019-11-26 ENCOUNTER — Other Ambulatory Visit: Payer: Self-pay

## 2019-11-26 ENCOUNTER — Encounter: Payer: Self-pay | Admitting: Cardiovascular Disease

## 2019-11-26 ENCOUNTER — Ambulatory Visit (INDEPENDENT_AMBULATORY_CARE_PROVIDER_SITE_OTHER): Payer: Medicare HMO | Admitting: Cardiovascular Disease

## 2019-11-26 VITALS — BP 120/70 | HR 96 | Ht 61.0 in | Wt 115.5 lb

## 2019-11-26 DIAGNOSIS — E782 Mixed hyperlipidemia: Secondary | ICD-10-CM | POA: Diagnosis not present

## 2019-11-26 DIAGNOSIS — I10 Essential (primary) hypertension: Secondary | ICD-10-CM

## 2019-11-26 DIAGNOSIS — I2510 Atherosclerotic heart disease of native coronary artery without angina pectoris: Secondary | ICD-10-CM | POA: Diagnosis not present

## 2019-11-26 NOTE — Patient Instructions (Addendum)
  Medication Instructions:  No medications changes. *If you need a refill on your cardiac medications before your next appointment, please call your pharmacy*  Lab Work: None ordered. If you have labs (blood work) drawn today and your tests are completely normal, you will receive your results only by: Marland Kitchen MyChart Message (if you have MyChart) OR . A paper copy in the mail If you have any lab test that is abnormal or we need to change your treatment, we will call you to review the results.  Testing/Procedures: None ordered  Follow-Up: At [facility], you and your health needs are our priority.  As part of our continuing mission to provide you with exceptional heart care, we have created designated Provider Care Teams.  These Care Teams include your primary Cardiologist (physician) and Advanced Practice Providers (APPs -  Physician Assistants and Nurse Practitioners) who all work together to provide you with the care you need, when you need it.  Your next appointment:   6 month(s)  The format for your next appointment:   In Person  Provider:    You may see Lorine Bears, MD or one of the following Advanced Practice Providers on your designated Care Team:    Nicolasa Ducking, NP  Eula Listen, PA-C  Marisue Ivan, PA-C   Other Instructions COVID-19 Vaccine Information can be found at: [email or web address] For questions related to vaccine distribution or appointments, please email [email or web address] or call [phone].

## 2019-11-26 NOTE — Progress Notes (Signed)
Cardiology Office Note   Date:  11/26/2019   ID:  Tanya, Cole 1933-06-26, MRN [id number]  PCP:  Einar Pheasant, MD  Cardiologist:   Kathlyn Sacramento, MD   Chief Complaint  Patient presents with  . other    3 month follow up. "doing well."       History of Present Illness: Tanya Cole is a 86 y.o. female who presents for a follow-up visit regarding coronary artery disease. She has known history of coronary artery disease, diabetes, hypertension and hyperlipidemia.  She had non-ST elevation myocardial infarction in December 2019.  Cardiac catheterization showed 95% mid LAD stenosis and 60% distal LAD stenosis.  The mid LAD stenosis was treated successfully with PCI and drug-eluting stent placement.  She was hospitalized in February 2020 with atypical chest pain and negative troponin.  Lexiscan Myoview in March showed no evidence of ischemia with normal ejection fraction.   She has been doing well with no recent chest pain, shortness of breath or palpitations.  She takes her medications regularly.    Past Medical History:  Diagnosis Date  . Aortic insufficiency    a. TTE 12/19: EF 55-60%, probable HK of the mid apical anterior septal myocardium, Gr1DD, mild AI, mildly dilated LA  . Asthma   . CAD (coronary artery disease)    a. NSTEMI 12/19; b. LHC 10/08/18: LM minimal luminal irregs, mLAD-1 95% s/p PCI/DES, mLAD-2 60%, LCx mild diffuse disease throughout, RCA minimal luminal irregs  . Diabetes mellitus ([place])   . Hypercholesterolemia   . Hypertension   . Myocardial infarction ([place])   . Osteopenia   . Polymyalgia rheumatica syndrome ([place])   . Reactive airway disease     Past Surgical History:  Procedure Laterality Date  . ABDOMINAL HYSTERECTOMY  1981   prolapse and bleeding, ovaries not removed  . BREAST EXCISIONAL BIOPSY Right   . CHOLECYSTECTOMY N/A 09/02/2019   Procedure: LAPAROSCOPIC CHOLECYSTECTOMY WITH INTRAOPERATIVE CHOLANGIOGRAM;  Surgeon: Olean Ree, MD;  Location: [facility];  Service: General;  Laterality: N/A;  . CORONARY STENT INTERVENTION N/A 10/08/2018   Procedure: CORONARY STENT INTERVENTION;  Surgeon: Wellington Hampshire, MD;  Location: [place] [facility];  Service: Cardiovascular;  Laterality: N/A;  . ENDOSCOPIC RETROGRADE CHOLANGIOPANCREATOGRAPHY (ERCP) WITH PROPOFOL N/A 08/08/2019   Procedure: ENDOSCOPIC RETROGRADE CHOLANGIOPANCREATOGRAPHY (ERCP) WITH PROPOFOL;  Surgeon: Lucilla Lame, MD;  Location: [facility];  Service: Endoscopy;  Laterality: N/A;  . LEFT HEART CATH AND CORONARY ANGIOGRAPHY N/A 10/08/2018   Procedure: LEFT HEART CATH AND CORONARY ANGIOGRAPHY;  Surgeon: Wellington Hampshire, MD;  Location: [place] [facility];  Service: Cardiovascular;  Laterality: N/A;  . UMBILICAL HERNIA REPAIR  7/94     Current Outpatient Medications  Medication Sig Dispense Refill  . ADVAIR DISKUS 250-50 MCG/DOSE AEPB INHALE ONE PUFF BY MOUTH EVERY 12 HOURS. RINSE MOUTH AFTER EACH USE (Patient taking differently: Inhale 1 puff into the lungs 2 (two) times daily. ) 60 each 5  . alendronate (FOSAMAX) 70 MG tablet Take 70 mg by mouth once a week.     Marland Kitchen aspirin 81 MG tablet Take 81 mg by mouth daily.    Marland Kitchen atorvastatin (LIPITOR) 40 MG tablet Take 1 tablet (40 mg total) by mouth daily at 6 PM. 90 tablet 3  . clopidogrel (PLAVIX) 75 MG tablet Take 1 tablet by mouth once daily with breakfast 90 tablet 0  . cyanocobalamin (,VITAMIN B-12,) 1000 MCG/ML injection Inject 1,000 mcg into the muscle every 30 (  thirty) days.     . fluticasone (FLONASE) 50 MCG/ACT nasal spray Use 2 spray(s) in each nostril once daily 16 g 0  . ibuprofen (ADVIL) 400 MG tablet Take 1 tablet (400 mg total) by mouth every 8 (eight) hours as needed for mild pain or moderate pain. 30 tablet 0  . lisinopril (ZESTRIL) 20 MG tablet Take 1 tablet (20 mg total) by mouth daily. 90 tablet 3  . metFORMIN (GLUCOPHAGE) 500 MG tablet TAKE 1 TABLET BY MOUTH TWICE DAILY WITH A MEAL (Patient  taking differently: Take 500 mg by mouth 2 (two) times daily with a meal. ) 180 tablet 1  . metoprolol tartrate (LOPRESSOR) 25 MG tablet Take 1/2 (one-half) tablet by mouth twice daily 90 tablet 0  . Multiple Vitamins-Minerals (VISION FORMULA/LUTEIN PO) Take by mouth daily.    . nitroGLYCERIN (NITROSTAT) 0.4 MG SL tablet Place 1 tablet (0.4 mg total) under the tongue every 5 (five) minutes as needed for chest pain. 30 tablet 12  . predniSONE (DELTASONE) 10 MG tablet Take 4 tablets x 1 day and then decrease by 1/2 tablet per day until down to zero mg. 18 tablet 0  . PROAIR HFA 108 (90 Base) MCG/ACT inhaler Inhale 2 puffs into the lungs every 6 (six) hours as needed. (Patient taking differently: Inhale 2 puffs into the lungs every 6 (six) hours as needed for wheezing or shortness of breath. ) 18 g 2  . Vitamin D, Ergocalciferol, (DRISDOL) 50000 units CAPS capsule Take 50,000 Units by mouth once a week.  3   No current facility-administered medications for this visit.    Allergies:   Tramadol    Social History:  The patient  reports that she has never smoked. She has never used smokeless tobacco. She reports that she does not drink alcohol or use drugs.   Family History:  The patient's family history includes Arthritis in her mother; COPD in her brother; Heart attack in her father; Heart disease in her mother; Parkinson's disease in her sister; Throat cancer in her sister.    ROS:  Please see the history of present illness.   Otherwise, review of systems are positive for none.   All other systems are reviewed and negative.    PHYSICAL EXAM: VS:  BP 120/70 (BP Location: Left Arm, Patient Position: Sitting, Cuff Size: Normal)   Pulse 96   Ht 5\' 1"  (1.549 m)   Wt 115 lb 8 oz (52.4 kg)   BMI 21.82 kg/m  , BMI Body mass index is 21.82 kg/m. GEN: Well nourished, well developed, in no acute distress  HEENT: normal  Neck: no JVD, carotid bruits, or masses Cardiac: RRR; no murmurs, rubs, or  gallops,no edema  Respiratory:  clear to auscultation bilaterally, normal work of breathing GI: soft, nontender, nondistended, + BS MS: no deformity or atrophy  Skin: warm and dry, no rash Neuro:  Strength and sensation are intact Psych: euthymic mood, full affect   EKG:  EKG is ordered today. The ekg ordered today demonstrates normal sinus rhythm with no significant ST or T wave changes.   Recent Labs: 08/15/2019: Hemoglobin 12.3; Platelets 419 09/17/2019: ALT 10; BUN 22; Creatinine, Ser 0.65; Potassium 4.7; Sodium 140; TSH 3.59    Lipid Panel    Component Value Date/Time   CHOL 162 09/17/2019 1504   TRIG 207.0 (H) 09/17/2019 1504   HDL 44.30 09/17/2019 1504   CHOLHDL 4 09/17/2019 1504   VLDL 41.4 (H) 09/17/2019 1504   LDLCALC 84  05/24/2019 0809   LDLDIRECT 90.0 09/17/2019 1504      Wt Readings from Last 3 Encounters:  11/26/19 115 lb 8 oz (52.4 kg)  09/30/19 115 lb (52.2 kg)  09/17/19 115 lb (52.2 kg)     No flowsheet data found.    ASSESSMENT AND PLAN:  1.  Coronary artery disease involving native coronary arteries without angina: She is doing very well with no anginal symptoms.  Continue medical therapy.  I am planning to discontinue clopidogrel at the end of 2021.  2.  Essential hypertension: Blood pressure is well controlled on metoprolol and lisinopril.  3.  Hyperlipidemia: Continue atorvastatin.  I reviewed recent lipid profile which showed an LDL of 90 and triglyceride of 207.  I made no changes today in her medications.  She is going to work on improving her diet.  If LDL remains above 70, we should consider switching to rosuvastatin or adding Zetia.   Disposition:   FU with me in 6 months  Signed,  Lorine Bears, MD  11/26/2019 4:06 PM    [facility]

## 2019-11-28 DIAGNOSIS — E119 Type 2 diabetes mellitus without complications: Secondary | ICD-10-CM | POA: Diagnosis not present

## 2019-11-28 DIAGNOSIS — G8929 Other chronic pain: Secondary | ICD-10-CM | POA: Diagnosis not present

## 2019-11-28 DIAGNOSIS — I252 Old myocardial infarction: Secondary | ICD-10-CM | POA: Diagnosis not present

## 2019-11-28 DIAGNOSIS — R69 Illness, unspecified: Secondary | ICD-10-CM | POA: Diagnosis not present

## 2019-11-28 DIAGNOSIS — M1990 Unspecified osteoarthritis, unspecified site: Secondary | ICD-10-CM | POA: Diagnosis not present

## 2019-11-28 DIAGNOSIS — I10 Essential (primary) hypertension: Secondary | ICD-10-CM | POA: Diagnosis not present

## 2019-11-28 DIAGNOSIS — I2510 Atherosclerotic heart disease of native coronary artery without angina pectoris: Secondary | ICD-10-CM | POA: Diagnosis not present

## 2019-11-28 DIAGNOSIS — J302 Other seasonal allergic rhinitis: Secondary | ICD-10-CM | POA: Diagnosis not present

## 2019-11-28 DIAGNOSIS — E785 Hyperlipidemia, unspecified: Secondary | ICD-10-CM | POA: Diagnosis not present

## 2019-11-28 DIAGNOSIS — J449 Chronic obstructive pulmonary disease, unspecified: Secondary | ICD-10-CM | POA: Diagnosis not present

## 2019-12-04 ENCOUNTER — Ambulatory Visit: Payer: Medicare HMO

## 2019-12-05 ENCOUNTER — Other Ambulatory Visit: Payer: Self-pay

## 2019-12-05 ENCOUNTER — Ambulatory Visit (INDEPENDENT_AMBULATORY_CARE_PROVIDER_SITE_OTHER): Payer: Medicare HMO

## 2019-12-05 VITALS — Temp 96.6°F

## 2019-12-05 DIAGNOSIS — E538 Deficiency of other specified B group vitamins: Secondary | ICD-10-CM

## 2019-12-05 MED ORDER — CYANOCOBALAMIN 1000 MCG/ML IJ SOLN
1000.0000 ug | Freq: Once | INTRAMUSCULAR | Status: AC
  Administered 2019-12-05: 15:00:00 1000 ug via INTRAMUSCULAR

## 2019-12-05 NOTE — Progress Notes (Addendum)
Tanya Cole presents today for injection per MD orders. B12 injection administered IM in left Upper Arm. Administration without incident. Patient tolerated well.  Rhiann Boucher,cma   Reviewed.  Dr Lorin Picket

## 2019-12-10 ENCOUNTER — Telehealth: Payer: Self-pay | Admitting: Internal Medicine

## 2019-12-10 ENCOUNTER — Ambulatory Visit (INDEPENDENT_AMBULATORY_CARE_PROVIDER_SITE_OTHER): Payer: Medicare HMO | Admitting: Internal Medicine

## 2019-12-10 ENCOUNTER — Other Ambulatory Visit: Payer: Self-pay

## 2019-12-10 DIAGNOSIS — R05 Cough: Secondary | ICD-10-CM | POA: Diagnosis not present

## 2019-12-10 DIAGNOSIS — E1159 Type 2 diabetes mellitus with other circulatory complications: Secondary | ICD-10-CM

## 2019-12-10 DIAGNOSIS — R059 Cough, unspecified: Secondary | ICD-10-CM

## 2019-12-10 MED ORDER — PROAIR HFA 108 (90 BASE) MCG/ACT IN AERS: INHALATION_SPRAY | RESPIRATORY_TRACT | 2 refills | Status: DC

## 2019-12-10 MED ORDER — PREDNISONE 10 MG PO TABS: ORAL_TABLET | ORAL | 0 refills | Status: DC

## 2019-12-10 NOTE — Telephone Encounter (Signed)
Pt states that her COPD is acting up and wants something called in for her breathing and cough. Pt did not want to be triaged for shortness of breath. Please advise.

## 2019-12-10 NOTE — Telephone Encounter (Signed)
Pt scheduled for virtual with Dr Lorin Picket today, confirmed with Shanda Bumps no acute distress when pt was scheduled. She refused to be triaged.

## 2019-12-10 NOTE — Progress Notes (Signed)
[name], female   DOB: 1932-11-11, 86 y.o.   MRN: [id number]   Virtual Visit via telephone Note  This visit type was conducted due to national recommendations for restrictions regarding the COVID-19 pandemic (e.g. social distancing).  This format is felt to be most appropriate for this patient at this time.  All issues noted in this document were discussed and addressed.  No physical exam was performed (except for noted visual exam findings with Video Visits).   I connected with [name] by telephone and verified that I am speaking with the correct person using two identifiers. Location patient: home Location provider: work Persons participating in the telephone visit: patient, provider  The limitations, risks, security and privacy concerns of performing an evaluation and management service by telephone and the availability of in person appointments have been discussed.  The patient expressed understanding and agreed to proceed.   Reason for visit: work in appt.   HPI: She reports increased cough and drainage recently.  Also noticed some increased sob this am.  No chest pain.  Minimal sob and chest tightness.  Some increased cough and drainage.  No sore throat.  No sinus pressure.  No fever.  No nausea or vomiting.  Has taken Tussin DM.  Also using inhalers.  Denies known exposure to covid.  Did agree to be tested.     ROS: See pertinent positives and negatives per HPI.  Past Medical History:  Diagnosis Date  . Aortic insufficiency    a. TTE 12/19: EF 55-60%, probable HK of the mid apical anterior septal myocardium, Gr1DD, mild AI, mildly dilated LA  . Asthma   . CAD (coronary artery disease)    a. NSTEMI 12/19; b. LHC 10/08/18: LM minimal luminal irregs, mLAD-1 95% s/p PCI/DES, mLAD-2 60%, LCx mild diffuse disease throughout, RCA minimal luminal irregs  . Diabetes mellitus (HCC)   . Hypercholesterolemia   . Hypertension   . Myocardial infarction (HCC)   .  Osteopenia   . Polymyalgia rheumatica syndrome (HCC)   . Reactive airway disease     Past Surgical History:  Procedure Laterality Date  . ABDOMINAL HYSTERECTOMY  1981   prolapse and bleeding, ovaries not removed  . BREAST EXCISIONAL BIOPSY Right   . CHOLECYSTECTOMY N/A 09/02/2019   Procedure: LAPAROSCOPIC CHOLECYSTECTOMY WITH INTRAOPERATIVE CHOLANGIOGRAM;  Surgeon: Henrene Dodge, MD;  Location: [facility];  Service: General;  Laterality: N/A;  . CORONARY STENT INTERVENTION N/A 10/08/2018   Procedure: CORONARY STENT INTERVENTION;  Surgeon: Iran Ouch, MD;  Location: [facility];  Service: Cardiovascular;  Laterality: N/A;  . ENDOSCOPIC RETROGRADE CHOLANGIOPANCREATOGRAPHY (ERCP) WITH PROPOFOL N/A 08/08/2019   Procedure: ENDOSCOPIC RETROGRADE CHOLANGIOPANCREATOGRAPHY (ERCP) WITH PROPOFOL;  Surgeon: Midge Minium, MD;  Location: [facility];  Service: Endoscopy;  Laterality: N/A;  . LEFT HEART CATH AND CORONARY ANGIOGRAPHY N/A 10/08/2018   Procedure: LEFT HEART CATH AND CORONARY ANGIOGRAPHY;  Surgeon: Iran Ouch, MD;  Location: [facility];  Service: Cardiovascular;  Laterality: N/A;  . UMBILICAL HERNIA REPAIR  7/94    Family History  Problem Relation Age of Onset  . Heart attack Father   . Arthritis Mother   . Heart disease Mother   . Throat cancer Sister   . Parkinson's disease Sister   . COPD Brother     SOCIAL HX: reviewed.    Current Outpatient Medications:  .  ADVAIR DISKUS 250-50 MCG/DOSE AEPB, INHALE ONE PUFF BY MOUTH EVERY 12 HOURS. RINSE MOUTH AFTER  EACH USE (Patient taking differently: Inhale 1 puff into the lungs 2 (two) times daily. ), Disp: 60 each, Rfl: 5 .  alendronate (FOSAMAX) 70 MG tablet, Take 70 mg by mouth once a week. , Disp: , Rfl:  .  aspirin 81 MG tablet, Take 81 mg by mouth daily., Disp: , Rfl:  .  atorvastatin (LIPITOR) 40 MG tablet, Take 1 tablet (40 mg total) by mouth daily at 6 PM., Disp: 90 tablet, Rfl: 3 .  clopidogrel  (PLAVIX) 75 MG tablet, Take 1 tablet by mouth once daily with breakfast, Disp: 90 tablet, Rfl: 0 .  fluticasone (FLONASE) 50 MCG/ACT nasal spray, Use 2 spray(s) in each nostril once daily, Disp: 16 g, Rfl: 0 .  ibuprofen (ADVIL) 400 MG tablet, Take 1 tablet (400 mg total) by mouth every 8 (eight) hours as needed for mild pain or moderate pain., Disp: 30 tablet, Rfl: 0 .  lisinopril (ZESTRIL) 20 MG tablet, Take 1 tablet (20 mg total) by mouth daily., Disp: 90 tablet, Rfl: 3 .  metFORMIN (GLUCOPHAGE) 500 MG tablet, TAKE 1 TABLET BY MOUTH TWICE DAILY WITH A MEAL (Patient taking differently: Take 500 mg by mouth 2 (two) times daily with a meal. ), Disp: 180 tablet, Rfl: 1 .  metoprolol tartrate (LOPRESSOR) 25 MG tablet, Take 1/2 (one-half) tablet by mouth twice daily, Disp: 90 tablet, Rfl: 0 .  Multiple Vitamins-Minerals (VISION FORMULA/LUTEIN PO), Take by mouth daily., Disp: , Rfl:  .  nitroGLYCERIN (NITROSTAT) 0.4 MG SL tablet, Place 1 tablet (0.4 mg total) under the tongue every 5 (five) minutes as needed for chest pain., Disp: 30 tablet, Rfl: 12 .  predniSONE (DELTASONE) 10 MG tablet, Take 6 tablets x 1 day and then decrease by 1/2 tablet per day until down to zero mg., Disp: 39 tablet, Rfl: 0 .  PROAIR HFA 108 (90 Base) MCG/ACT inhaler, Two puffs q 4-6 hours prn, Disp: 18 g, Rfl: 2 .  Vitamin D, Ergocalciferol, (DRISDOL) 50000 units CAPS capsule, Take 50,000 Units by mouth once a week., Disp: , Rfl: 3  EXAM:  GENERAL: alert.  Sounds to be in no acute distress.  Answering questions appropriately.   PSYCH/NEURO: pleasant and cooperative, no obvious depression or anxiety, speech and thought processing grossly intact  ASSESSMENT AND PLAN:  Discussed the following assessment and plan:  Cough Increased cough and congestion as outlined.  Some minimal chest tightness and sob.  Feels similar to her previous flares.  Denies exposure to covid.  Did agree to be tested.  Treat with prednisone taper as  directed. Steroid nasal spray and mucinex as directed.  Follow closely.  Call with update.   Diabetes mellitus with cardiac complication (HCC) Stay hydrated.  Monitor for increased sugar with prednisone.  Follow.    Meds ordered this encounter  Medications  . predniSONE (DELTASONE) 10 MG tablet    Sig: Take 6 tablets x 1 day and then decrease by 1/2 tablet per day until down to zero mg.    Dispense:  39 tablet    Refill:  0  . PROAIR HFA 108 (90 Base) MCG/ACT inhaler    Sig: Two puffs q 4-6 hours prn    Dispense:  18 g    Refill:  2     I discussed the assessment and treatment plan with the patient. The patient was provided an opportunity to ask questions and all were answered. The patient agreed with the plan and demonstrated an understanding of the instructions.  The patient was advised to call back or seek an in-person evaluation if the symptoms worsen or if the condition fails to improve as anticipated.  I provided of non-face-to-face time during this encounter.   [place], MD

## 2019-12-11 ENCOUNTER — Ambulatory Visit: Payer: Medicare HMO | Attending: Internal Medicine

## 2019-12-11 ENCOUNTER — Other Ambulatory Visit: Payer: Medicare HMO

## 2019-12-11 DIAGNOSIS — Z20822 Contact with and (suspected) exposure to covid-19: Secondary | ICD-10-CM

## 2019-12-12 LAB — NOVEL CORONAVIRUS, NAA: SARS-CoV-2, NAA: NOT DETECTED

## 2019-12-14 ENCOUNTER — Encounter: Payer: Self-pay | Admitting: Internal Medicine

## 2019-12-14 NOTE — Assessment & Plan Note (Addendum)
Increased cough and congestion as outlined.  Some minimal chest tightness and sob.  Feels similar to her previous flares.  Denies exposure to covid.  Did agree to be tested.  Treat with prednisone taper as directed. Steroid nasal spray and mucinex as directed.  Follow closely.  Call with update.

## 2019-12-14 NOTE — Assessment & Plan Note (Signed)
Stay hydrated.  Monitor for increased sugar with prednisone.  Follow.

## 2019-12-25 DIAGNOSIS — M353 Polymyalgia rheumatica: Secondary | ICD-10-CM | POA: Diagnosis not present

## 2019-12-25 DIAGNOSIS — M1711 Unilateral primary osteoarthritis, right knee: Secondary | ICD-10-CM | POA: Diagnosis not present

## 2020-01-02 ENCOUNTER — Ambulatory Visit (INDEPENDENT_AMBULATORY_CARE_PROVIDER_SITE_OTHER): Payer: Medicare Other

## 2020-01-02 ENCOUNTER — Other Ambulatory Visit: Payer: Self-pay

## 2020-01-02 DIAGNOSIS — E538 Deficiency of other specified B group vitamins: Secondary | ICD-10-CM

## 2020-01-02 MED ORDER — CYANOCOBALAMIN 1000 MCG/ML IJ SOLN
1000.0000 ug | Freq: Once | INTRAMUSCULAR | Status: AC
  Administered 2020-01-02: 14:00:00 1000 ug via INTRAMUSCULAR

## 2020-01-02 NOTE — Progress Notes (Addendum)
Patient presented for B 12 injection to left deltoid, patient voiced no concerns nor showed any signs of distress during injection.  Reviewed.  Dr Scott 

## 2020-01-06 ENCOUNTER — Other Ambulatory Visit: Payer: Self-pay | Admitting: Internal Medicine

## 2020-01-08 ENCOUNTER — Ambulatory Visit: Payer: Medicare Other

## 2020-01-09 ENCOUNTER — Ambulatory Visit (INDEPENDENT_AMBULATORY_CARE_PROVIDER_SITE_OTHER): Payer: Medicare Other

## 2020-01-09 ENCOUNTER — Other Ambulatory Visit: Payer: Self-pay

## 2020-01-09 VITALS — Ht 61.0 in | Wt 115.0 lb

## 2020-01-09 DIAGNOSIS — Z0000 Encounter for general adult medical examination without abnormal findings: Secondary | ICD-10-CM | POA: Diagnosis not present

## 2020-01-09 NOTE — Patient Instructions (Addendum)
  Tanya Cole , Thank you for taking time to come for your Medicare Wellness Visit. I appreciate your ongoing commitment to your health goals. Please review the following plan we discussed and let me know if I can assist you in the future.   These are the goals we discussed: Goals      Patient Stated   . Increase physical activity (pt-stated)     Walk outside more for exercise       This is a list of the screening recommended for you and due dates:  Health Maintenance  Topic Date Due  . Tetanus Vaccine  Never done  . Complete foot exam   02/16/2018  . Mammogram  01/25/2019  . Hemoglobin A1C  03/16/2020  . Eye exam for diabetics  11/14/2020  . DEXA scan (bone density measurement)  Completed  . Pneumonia vaccines  Completed  . Flu Shot  Discontinued

## 2020-01-09 NOTE — Progress Notes (Addendum)
Subjective:   Tanya Cole is a 86 y.o. female who presents for Medicare Annual (Subsequent) preventive examination.  Review of Systems:  No ROS.  Medicare Wellness Virtual Visit.  Visual/audio telehealth visit, UTA vital signs.   Ht/Wt provided.  See social history for additional risk factors.   Cardiac Risk Factors include: advanced age (>4men, >5 women);diabetes mellitus;hypertension     Objective:     Vitals: Ht 5\' 1"  (1.549 m)   Wt 115 lb (52.2 kg)   BMI 21.73 kg/m   Body mass index is 21.73 kg/m.  Advanced Directives 01/09/2020 09/02/2019 08/28/2019 08/15/2019 08/05/2019 08/04/2019 01/07/2019  Does Patient Have a Medical Advance Directive? Yes Yes Yes Yes Yes Yes Yes  Type of 03/09/2019 of [place];Living will Healthcare Power of [place];Living will Healthcare Power of [place];Living will - Living will Living will Healthcare Power of [place];Living will  Does patient want to make changes to medical advance directive? No - Patient declined No - Patient declined - - No - Patient declined - No - Patient declined  Copy of Healthcare Power of Attorney in Chart? No - copy requested No - copy requested No - copy requested - - - No - copy requested  Would patient like information on creating a medical advance directive? - No - Patient declined - No - Patient declined - - -  Pre-existing out of facility DNR order (yellow form or pink MOST form) - - - - - - -    Tobacco Social History   Tobacco Use  Smoking Status Never Smoker  Smokeless Tobacco Never Used     Counseling given: Not Answered   Clinical Intake:  Pre-visit preparation completed: Yes        Diabetes: Yes(Followed by pcp)  How often do you need to have someone help you when you read instructions, pamphlets, or other written materials from your doctor or pharmacy?: 1 - Never  Interpreter Needed?: No     Past Medical History:  Diagnosis Date  . Aortic insufficiency    a. TTE 12/19: EF 55-60%, probable HK of the mid apical anterior septal myocardium, Gr1DD, mild AI, mildly dilated LA  . Asthma   . CAD (coronary artery disease)    a. NSTEMI 12/19; b. LHC 10/08/18: LM minimal luminal irregs, mLAD-1 95% s/p PCI/DES, mLAD-2 60%, LCx mild diffuse disease throughout, RCA minimal luminal irregs  . Diabetes mellitus (HCC)   . Hypercholesterolemia   . Hypertension   . Myocardial infarction (HCC)   . Osteopenia   . Polymyalgia rheumatica syndrome (HCC)   . Reactive airway disease    Past Surgical History:  Procedure Laterality Date  . ABDOMINAL HYSTERECTOMY  1981   prolapse and bleeding, ovaries not removed  . BREAST EXCISIONAL BIOPSY Right   . CHOLECYSTECTOMY N/A 09/02/2019   Procedure: LAPAROSCOPIC CHOLECYSTECTOMY WITH INTRAOPERATIVE CHOLANGIOGRAM;  Surgeon: 13/12/2018, MD;  Location: [facility];  Service: General;  Laterality: N/A;  . CORONARY STENT INTERVENTION N/A 10/08/2018   Procedure: CORONARY STENT INTERVENTION;  Surgeon: 14/07/2018, MD;  Location: [facility];  Service: Cardiovascular;  Laterality: N/A;  . ENDOSCOPIC RETROGRADE CHOLANGIOPANCREATOGRAPHY (ERCP) WITH PROPOFOL N/A 08/08/2019   Procedure: ENDOSCOPIC RETROGRADE CHOLANGIOPANCREATOGRAPHY (ERCP) WITH PROPOFOL;  Surgeon: 10/08/2019, MD;  Location: [facility];  Service: Endoscopy;  Laterality: N/A;  . LEFT HEART CATH AND CORONARY ANGIOGRAPHY N/A 10/08/2018   Procedure: LEFT HEART CATH AND CORONARY ANGIOGRAPHY;  Surgeon: 14/07/2018, MD;  Location: [facility];  Service: Cardiovascular;  Laterality: N/A;  . UMBILICAL HERNIA REPAIR  7/94   Family History  Problem Relation Age of Onset  . Heart attack Father   . Arthritis Mother   . Heart disease Mother   . Throat cancer Sister   . Parkinson's disease Sister   . COPD Brother    Social History   Socioeconomic History  . Marital status: Widowed    Spouse name: Not on file  . Number of children: 3  . Years  of education: Not on file  . Highest education level: Not on file  Occupational History  . Not on file  Tobacco Use  . Smoking status: Never Smoker  . Smokeless tobacco: Never Used  Substance and Sexual Activity  . Alcohol use: No    Alcohol/week: 0.0 standard drinks  . Drug use: No  . Sexual activity: Not Currently  Other Topics Concern  . Not on file  Social History Narrative  . Not on file   Social Determinants of Health   Financial Resource Strain:   . Difficulty of Paying Living Expenses:   Food Insecurity:   . Worried About Programme researcher, broadcasting/film/video in the Last Year:   . Barista in the Last Year:   Transportation Needs:   . Freight forwarder (Medical):   Marland Kitchen Lack of Transportation (Non-Medical):   Physical Activity:   . Days of Exercise per Week:   . Minutes of Exercise per Session:   Stress:   . Feeling of Stress :   Social Connections: Slightly Isolated  . Frequency of Communication with Friends and Family: More than three times a week  . Frequency of Social Gatherings with Friends and Family: More than three times a week  . Attends Religious Services: More than 4 times per year  . Active Member of Clubs or Organizations: Yes  . Attends Banker Meetings: Not on file  . Marital Status: Widowed    Outpatient Encounter Medications as of 01/09/2020  Medication Sig  . ADVAIR DISKUS 250-50 MCG/DOSE AEPB INHALE ONE PUFF BY MOUTH EVERY 12 HOURS. RINSE MOUTH AFTER EACH USE (Patient taking differently: Inhale 1 puff into the lungs 2 (two) times daily. )  . alendronate (FOSAMAX) 70 MG tablet Take 70 mg by mouth once a week.   Marland Kitchen aspirin 81 MG tablet Take 81 mg by mouth daily.  Marland Kitchen atorvastatin (LIPITOR) 40 MG tablet Take 1 tablet (40 mg total) by mouth daily at 6 PM.  . clopidogrel (PLAVIX) 75 MG tablet Take 1 tablet by mouth once daily with breakfast  . fluticasone (FLONASE) 50 MCG/ACT nasal spray Use 2 spray(s) in each nostril once daily  . ibuprofen  (ADVIL) 400 MG tablet Take 1 tablet (400 mg total) by mouth every 8 (eight) hours as needed for mild pain or moderate pain.  Marland Kitchen lisinopril (ZESTRIL) 20 MG tablet Take 1 tablet (20 mg total) by mouth daily.  . metFORMIN (GLUCOPHAGE) 500 MG tablet TAKE 1 TABLET BY MOUTH TWICE DAILY WITH A MEAL  . metoprolol tartrate (LOPRESSOR) 25 MG tablet Take 1/2 (one-half) tablet by mouth twice daily  . Multiple Vitamins-Minerals (VISION FORMULA/LUTEIN PO) Take by mouth daily.  . nitroGLYCERIN (NITROSTAT) 0.4 MG SL tablet Place 1 tablet (0.4 mg total) under the tongue every 5 (five) minutes as needed for chest pain.  . predniSONE (DELTASONE) 10 MG tablet Take 6 tablets x 1 day and then decrease by 1/2 tablet per day until down  to zero mg.  . PROAIR HFA 108 (90 Base) MCG/ACT inhaler Two puffs q 4-6 hours prn  . Vitamin D, Ergocalciferol, (DRISDOL) 50000 units CAPS capsule Take 50,000 Units by mouth once a week.   Facility-Administered Encounter Medications as of 01/09/2020  Medication  . cyanocobalamin ((VITAMIN B-12)) injection 1,000 mcg    Activities of Daily Living In your present state of health, do you have any difficulty performing the following activities: 01/09/2020 08/28/2019  Hearing? N N  Vision? N N  Difficulty concentrating or making decisions? N N  Walking or climbing stairs? N Y  Dressing or bathing? N N  Doing errands, shopping? Y N  Preparing Food and eating ? N -  Using the Toilet? N -  In the past six months, have you accidently leaked urine? N -  Do you have problems with loss of bowel control? N -  Managing your Medications? N -  Managing your Finances? N -  Housekeeping or managing your Housekeeping? N -  Some recent data might be hidden    Patient Care Team: Einar Pheasant, MD as PCP - General (Internal Medicine) Wellington Hampshire, MD as PCP - Cardiology (Cardiology)    Assessment:   This is a routine wellness examination for [place].  Nurse connected with patient  01/09/20 at  1:00 PM EST by a telephone enabled telemedicine application and verified that I am speaking with the correct person using two identifiers. Patient stated full name and DOB. Patient gave permission to continue with virtual visit. Patient's location was at home and Nurse's location was at [place] office.   Patient is alert and oriented x3. Patient notes some difficulty with memory. Patient likes to read for brain stimulation.   Health Maintenance Due: -Foot Exam- Denies wounds or any changes. Followed by pcp.  -Hgb A1c- 09/17/19 (6.3) See completed HM at the end of note.   Eye: Visual acuity not assessed. Virtual visit. Followed by their ophthalmologist.  Retinopathy- none reported. Wears glasses.   Dental: Dentures- yes  Hearing: Demonstrates normal hearing during visit.  Safety:  Patient feels safe at home- yes Patient does have smoke detectors at home- yes Patient does wear sunscreen or protective clothing when in direct sunlight - yes Patient does wear seat belt when in a moving vehicle - yes Patient drives- no Adequate lighting in walkways free from debris- yes Grab bars and handrails used as appropriate- yes Ambulates with an assistive device- yes; cane Gilford Rile as needed.   Social: Alcohol intake - no      Smoking history- never   Smokers in home? none Illicit drug use? none  Medication: Taking as directed and without issues.  Pill box in use -yes  Self managed - yes   Covid-19: Precautions and sickness symptoms discussed. Wears mask, social distancing, hand hygiene as appropriate.   Activities of Daily Living Patient denies needing assistance with: household chores, feeding themselves, getting from bed to chair, getting to the toilet, bathing/showering, dressing, managing money, or preparing meals.  Assisted by grand daughter with grocery shopping.    Discussed the importance of a healthy diet, water intake and the benefits of aerobic exercise.     Physical activity- active around the home, yard work, walking  Diet:  Regular Water: good intake Caffeine: none  Other Providers Patient Care Team: Einar Pheasant, MD as PCP - General (Internal Medicine) Wellington Hampshire, MD as PCP - Cardiology (Cardiology)  Exercise Activities and Dietary recommendations Current Exercise Habits: Home exercise routine,  Time (Minutes): 10, Frequency (Times/Week): 2, Weekly Exercise (Minutes/Week): 20, Intensity: Mild  Goals      Patient Stated   . Increase physical activity (pt-stated)     Walk outside more for exercise       Fall Risk Fall Risk  01/09/2020 09/30/2019 09/17/2019 08/23/2019 06/21/2019  Falls in the past year? 0 0 0 0 0  Number falls in past yr: - 0 - 0 0  Injury with Fall? - - - 0 -  Risk for fall due to : - - - - -  Follow up Falls evaluation completed Falls evaluation completed - - -    Timed Get Up and Go performed: no, virtual visit  Depression Screen PHQ 2/9 Scores 01/09/2020 09/30/2019 06/18/2019 01/07/2019  PHQ - 2 Score 0 0 0 0     Cognitive Function MMSE - Mini Mental State Exam 12/25/2015  Orientation to time 5  Orientation to Place 5  Registration 3  Attention/ Calculation 5  Recall 3  Language- name 2 objects 2  Language- repeat 1  Language- follow 3 step command 3  Language- read & follow direction 1  Write a sentence 1  Copy design 1  Total score 30     6CIT Screen 01/09/2020 01/07/2019 01/03/2018 12/26/2016  What Year? 0 points 0 points 0 points 0 points  What month? 0 points 0 points 0 points 0 points  What time? 0 points 0 points 0 points 0 points  Count back from 20 0 points 2 points 0 points 0 points  Months in reverse 4 points 4 points 0 points 0 points  Repeat phrase 0 points - 0 points 0 points  Total Score 4 - 0 0    Immunization History  Administered Date(s) Administered  . Pneumococcal Conjugate-13 07/22/2014  . Pneumococcal Polysaccharide-23 01/03/2018   Screening Tests Health  Maintenance  Topic Date Due  . TETANUS/TDAP  Never done  . FOOT EXAM  02/16/2018  . MAMMOGRAM  01/25/2019  . HEMOGLOBIN A1C  03/16/2020  . OPHTHALMOLOGY EXAM  11/14/2020  . DEXA SCAN  Completed  . PNA vac Low Risk Adult  Completed  . INFLUENZA VACCINE  Discontinued      Plan:    Keep all routine maintenance appointments.   Medicare Attestation I have personally reviewed: The patient's medical and social history Their use of alcohol, tobacco or illicit drugs Their current medications and supplements The patient's functional ability including ADLs,fall risks, home safety risks, cognitive, and hearing and visual impairment Diet and physical activities Evidence for depression   I have reviewed and discussed with patient certain preventive protocols, quality metrics, and best practice recommendations.      Ashok Pall, LPN  [id number]   Reviewed above information.  Agree with assessment and plan.    Dr Lorin Picket

## 2020-01-24 ENCOUNTER — Other Ambulatory Visit: Payer: Self-pay | Admitting: Internal Medicine

## 2020-01-30 ENCOUNTER — Ambulatory Visit (INDEPENDENT_AMBULATORY_CARE_PROVIDER_SITE_OTHER): Payer: Medicare Other | Admitting: Internal Medicine

## 2020-01-30 ENCOUNTER — Other Ambulatory Visit: Payer: Self-pay

## 2020-01-30 VITALS — BP 150/78 | HR 88 | Temp 97.9°F | Resp 16 | Ht 61.0 in | Wt 116.8 lb

## 2020-01-30 DIAGNOSIS — R05 Cough: Secondary | ICD-10-CM

## 2020-01-30 DIAGNOSIS — J4520 Mild intermittent asthma, uncomplicated: Secondary | ICD-10-CM | POA: Diagnosis not present

## 2020-01-30 DIAGNOSIS — E538 Deficiency of other specified B group vitamins: Secondary | ICD-10-CM | POA: Diagnosis not present

## 2020-01-30 DIAGNOSIS — E1159 Type 2 diabetes mellitus with other circulatory complications: Secondary | ICD-10-CM | POA: Diagnosis not present

## 2020-01-30 DIAGNOSIS — I2510 Atherosclerotic heart disease of native coronary artery without angina pectoris: Secondary | ICD-10-CM

## 2020-01-30 DIAGNOSIS — R059 Cough, unspecified: Secondary | ICD-10-CM

## 2020-01-30 DIAGNOSIS — E7800 Pure hypercholesterolemia, unspecified: Secondary | ICD-10-CM

## 2020-01-30 DIAGNOSIS — I10 Essential (primary) hypertension: Secondary | ICD-10-CM | POA: Diagnosis not present

## 2020-01-30 MED ORDER — CEFDINIR 300 MG PO CAPS: 300.0000 mg | ORAL_CAPSULE | Freq: Two times a day (BID) | ORAL | 0 refills | Status: DC

## 2020-01-30 MED ORDER — CYANOCOBALAMIN 1000 MCG/ML IJ SOLN
1000.0000 ug | Freq: Once | INTRAMUSCULAR | Status: AC
  Administered 2020-01-30: 17:00:00 1000 ug via INTRAMUSCULAR

## 2020-01-30 MED ORDER — PREDNISONE 10 MG PO TABS: ORAL_TABLET | ORAL | 0 refills | Status: DC

## 2020-01-30 NOTE — Progress Notes (Addendum)
[name], female   DOB: 1933/08/22, 86 y.o.   MRN: [id number]   Subjective:    [name], female    DOB: 30-Sep-1933, 86 y.o.   MRN: [id number]  HPI This visit occurred during the SARS-CoV-2 public health emergency.  Safety protocols were in place, including screening questions prior to the visit, additional usage of staff PPE, and extensive cleaning of exam room while observing appropriate contact time as indicated for disinfecting solutions.  Patient here for a scheduled follow up.  She is accompanied by her daughter.  History obtained from both of them.  Reports she has been doing relatively well.  Tries to stay active.  No chest pain.  Did notice, starting two days ago, increased cough and congestion.  Did not sleep well - since started.  Using her rescue inhaler.  Not using advair on a regular basis.  States uses when needed.  No fever.  No sinus pressure.  No increased chest tightness or sob.  Eating.  No nausea or vomiting.  Bowels moving.    Past Medical History:  Diagnosis Date  . Aortic insufficiency    a. TTE 12/19: EF 55-60%, probable HK of the mid apical anterior septal myocardium, Gr1DD, mild AI, mildly dilated LA  . Asthma   . CAD (coronary artery disease)    a. NSTEMI 12/19; b. LHC 10/08/18: LM minimal luminal irregs, mLAD-1 95% s/p PCI/DES, mLAD-2 60%, LCx mild diffuse disease throughout, RCA minimal luminal irregs  . Diabetes mellitus ([place])   . Hypercholesterolemia   . Hypertension   . Myocardial infarction ([place])   . Osteopenia   . Polymyalgia rheumatica syndrome ([place])   . Reactive airway disease    Past Surgical History:  Procedure Laterality Date  . ABDOMINAL HYSTERECTOMY  1981   prolapse and bleeding, ovaries not removed  . BREAST EXCISIONAL BIOPSY Right   . CHOLECYSTECTOMY N/A 09/02/2019   Procedure: LAPAROSCOPIC CHOLECYSTECTOMY WITH INTRAOPERATIVE CHOLANGIOGRAM;  Surgeon: Olean Ree, MD;  Location: [facility];  Service:  General;  Laterality: N/A;  . CORONARY STENT INTERVENTION N/A 10/08/2018   Procedure: CORONARY STENT INTERVENTION;  Surgeon: Wellington Hampshire, MD;  Location: [place] [facility];  Service: Cardiovascular;  Laterality: N/A;  . ENDOSCOPIC RETROGRADE CHOLANGIOPANCREATOGRAPHY (ERCP) WITH PROPOFOL N/A 08/08/2019   Procedure: ENDOSCOPIC RETROGRADE CHOLANGIOPANCREATOGRAPHY (ERCP) WITH PROPOFOL;  Surgeon: Lucilla Lame, MD;  Location: [facility];  Service: Endoscopy;  Laterality: N/A;  . LEFT HEART CATH AND CORONARY ANGIOGRAPHY N/A 10/08/2018   Procedure: LEFT HEART CATH AND CORONARY ANGIOGRAPHY;  Surgeon: Wellington Hampshire, MD;  Location: [place] [facility];  Service: Cardiovascular;  Laterality: N/A;  . UMBILICAL HERNIA REPAIR  7/94   Family History  Problem Relation Age of Onset  . Heart attack Father   . Arthritis Mother   . Heart disease Mother   . Throat cancer Sister   . Parkinson's disease Sister   . COPD Brother    Social History   Socioeconomic History  . Marital status: Widowed    Spouse name: Not on file  . Number of children: 3  . Years of education: Not on file  . Highest education level: Not on file  Occupational History  . Not on file  Tobacco Use  . Smoking status: Never Smoker  . Smokeless tobacco: Never Used  Substance and Sexual Activity  . Alcohol use: No    Alcohol/week: 0.0 standard drinks  . Drug use: No  . Sexual activity: Not  Currently  Other Topics Concern  . Not on file  Social History Narrative  . Not on file   Social Determinants of Health   Financial Resource Strain:   . Difficulty of Paying Living Expenses:   Food Insecurity:   . Worried About Running Out of Food in the Last Year:   . Ran Out of Food in the Last Year:   Transportation Needs:   . Lack of Transportation (Medical):   . Lack of Transportation (Non-Medical):   Physical Activity:   . Days of Exercise per Week:   . Minutes of Exercise per Session:   Stress:   . Feeling of  Stress :   Social Connections: Slightly Isolated  . Frequency of Communication with Friends and Family: More than three times a week  . Frequency of Social Gatherings with Friends and Family: More than three times a week  . Attends Religious Services: More than 4 times per year  . Active Member of Clubs or Organizations: Yes  . Attends Club or Organization Meetings: Not on file  . Marital Status: Widowed    Outpatient Encounter Medications as of 01/30/2020  Medication Sig  . ADVAIR DISKUS 250-50 MCG/DOSE AEPB INHALE ONE PUFF BY MOUTH EVERY 12 HOURS. RINSE MOUTH AFTER EACH USE (Patient taking differently: Inhale 1 puff into the lungs 2 (two) times daily. )  . alendronate (FOSAMAX) 70 MG tablet Take 70 mg by mouth once a week.   . aspirin 81 MG tablet Take 81 mg by mouth daily.  . atorvastatin (LIPITOR) 40 MG tablet Take 1 tablet (40 mg total) by mouth daily at 6 PM.  . cefdinir (OMNICEF) 300 MG capsule Take 1 capsule (300 mg total) by mouth 2 (two) times daily.  . clopidogrel (PLAVIX) 75 MG tablet Take 1 tablet by mouth once daily with breakfast  . fluticasone (FLONASE) 50 MCG/ACT nasal spray Use 2 spray(s) in each nostril once daily  . ibuprofen (ADVIL) 400 MG tablet Take 1 tablet (400 mg total) by mouth every 8 (eight) hours as needed for mild pain or moderate pain.  . lisinopril (ZESTRIL) 20 MG tablet Take 1 tablet (20 mg total) by mouth daily.  . metFORMIN (GLUCOPHAGE) 500 MG tablet TAKE 1 TABLET BY MOUTH TWICE DAILY WITH A MEAL  . metoprolol tartrate (LOPRESSOR) 25 MG tablet Take 1/2 (one-half) tablet by mouth twice daily  . Multiple Vitamins-Minerals (VISION FORMULA/LUTEIN PO) Take by mouth daily.  . nitroGLYCERIN (NITROSTAT) 0.4 MG SL tablet Place 1 tablet (0.4 mg total) under the tongue every 5 (five) minutes as needed for chest pain.  . predniSONE (DELTASONE) 10 MG tablet Take 6 tablets x 1 day and then decrease by 1/2 tablet per day until down to zero mg.  . PROAIR HFA 108 (90 Base)  MCG/ACT inhaler Two puffs q 4-6 hours prn  . Vitamin D, Ergocalciferol, (DRISDOL) 50000 units CAPS capsule Take 50,000 Units by mouth once a week.  . [DISCONTINUED] predniSONE (DELTASONE) 10 MG tablet Take 6 tablets x 1 day and then decrease by 1/2 tablet per day until down to zero mg.  . [EXPIRED] cyanocobalamin ((VITAMIN B-12)) injection 1,000 mcg    No facility-administered encounter medications on file as of 01/30/2020.   Review of Systems  Constitutional: Negative for appetite change and fever.  HENT: Negative for congestion and sinus pressure.   Respiratory: Positive for cough. Negative for chest tightness.        No increased sob.  Increased cough and congestion.     Cardiovascular: Negative for chest pain and palpitations.  Gastrointestinal: Negative for abdominal pain, diarrhea, nausea and vomiting.  Genitourinary: Negative for difficulty urinating and dysuria.  Musculoskeletal: Negative for back pain and joint swelling.  Skin: Negative for color change and rash.  Neurological: Negative for dizziness, light-headedness and headaches.  Psychiatric/Behavioral: Negative for agitation and dysphoric mood.       Objective:    Physical Exam Constitutional:      General: She is not in acute distress.    Appearance: Normal appearance.  HENT:     Head: Normocephalic and atraumatic.     Right Ear: External ear normal.     Left Ear: External ear normal.     Nose: Nose normal.  Eyes:     General: No scleral icterus.       Right eye: No discharge.        Left eye: No discharge.     Conjunctiva/sclera: Conjunctivae normal.  Neck:     Thyroid: No thyromegaly.  Cardiovascular:     Rate and Rhythm: Normal rate and regular rhythm.  Pulmonary:     Effort: No respiratory distress.     Breath sounds: Normal breath sounds.     Comments: Increased wheezing with forced expiration.  Some increased cough with forced expiration.   Abdominal:     General: Bowel sounds are normal.      Palpations: Abdomen is soft.     Tenderness: There is no abdominal tenderness.  Musculoskeletal:        General: No swelling or tenderness.     Cervical back: Neck supple. No tenderness.  Lymphadenopathy:     Cervical: No cervical adenopathy.  Skin:    Findings: No erythema or rash.  Neurological:     Mental Status: She is alert.  Psychiatric:        Mood and Affect: Mood normal.        Behavior: Behavior normal.     BP (!) 150/78   Pulse 88   Temp 97.9 F (36.6 C)   Resp 16   Ht 5' 1" (1.549 m)   Wt 116 lb 12.8 oz (53 kg)   SpO2 96%   BMI 22.07 kg/m  Wt Readings from Last 3 Encounters:  01/30/20 116 lb 12.8 oz (53 kg)  01/09/20 115 lb (52.2 kg)  11/26/19 115 lb 8 oz (52.4 kg)     Lab Results  Component Value Date   WBC 18.0 (H) 08/15/2019   HGB 12.3 08/15/2019   HCT 37.7 08/15/2019   PLT 419 (H) 08/15/2019   GLUCOSE 97 09/17/2019   CHOL 162 09/17/2019   TRIG 207.0 (H) 09/17/2019   HDL 44.30 09/17/2019   LDLDIRECT 90.0 09/17/2019   LDLCALC 84 05/24/2019   ALT 10 09/17/2019   AST 13 09/17/2019   NA 140 09/17/2019   K 4.7 09/17/2019   CL 102 09/17/2019   CREATININE 0.65 09/17/2019   BUN 22 09/17/2019   CO2 26 09/17/2019   TSH 3.59 09/17/2019   HGBA1C 6.3 09/17/2019   MICROALBUR 4.3 (H) 05/24/2019       Assessment & Plan:   Problem List Items Addressed This Visit    CAD (coronary artery disease)    S/p PCI/DES - mid LAD.  Followed by cardiology.  No chest pain.  Continue risk factor modification.  Continue plavix, metoprolol and lipitor.        Cough    Increased cough and congestion as outlined - exacerbation.  advair bid.    Albuterol prn.  Treat with prednisone taper as directed.  rx given for omnicef if needed.  Call with update.        Diabetes mellitus with cardiac complication (HCC)    Low carb diet and exercise.  Place on prednisone.  Will need to follow sugars on steroids.  Stay hydrated.  Check met b and a1c.        Hypercholesterolemia      On lipitor.  Low cholesterol diet and exercise.  Follow lipid panel and liver function tests.        Hypertension    Blood pressure elevated today.  Hold on making adjustments in her medication.  Continue lisinopril 74m.  Have her spot check her pressure.  If persistent elevation will adjust medication.        Reactive airway disease    Increased cough/congestion as outlined.  Exacerbation.  Discussed with her the need to take her advair bid on a regular basis.  Albuterol inhaler prn.  Treat with prednisone taper as directed.  rx given for omnicef to take if needed.  Follow.  Call with update.         Other Visit Diagnoses    B12 deficiency    -  Primary   Relevant Medications   cyanocobalamin ((VITAMIN B-12)) injection 1,000 mcg (Completed)       CEinar Pheasant MD

## 2020-01-30 NOTE — Patient Instructions (Signed)
Take a probiotic daily while you are on the antibiotic and for two days after completing the antibiotics.

## 2020-02-02 ENCOUNTER — Encounter: Payer: Self-pay | Admitting: Internal Medicine

## 2020-02-02 NOTE — Assessment & Plan Note (Signed)
S/p PCI/DES - mid LAD.  Followed by cardiology.  No chest pain.  Continue risk factor modification.  Continue plavix, metoprolol and lipitor.

## 2020-02-02 NOTE — Assessment & Plan Note (Signed)
Increased cough/congestion as outlined.  Exacerbation.  Discussed with her the need to take her advair bid on a regular basis.  Albuterol inhaler prn.  Treat with prednisone taper as directed.  rx given for omnicef to take if needed.  Follow.  Call with update.

## 2020-02-02 NOTE — Assessment & Plan Note (Signed)
Low carb diet and exercise.  Place on prednisone.  Will need to follow sugars on steroids.  Stay hydrated.  Check met b and a1c.

## 2020-02-02 NOTE — Assessment & Plan Note (Signed)
Blood pressure elevated today.  Hold on making adjustments in her medication.  Continue lisinopril 20mg .  Have her spot check her pressure.  If persistent elevation will adjust medication.

## 2020-02-02 NOTE — Assessment & Plan Note (Signed)
On lipitor.  Low cholesterol diet and exercise.  Follow lipid panel and liver function tests.   

## 2020-02-02 NOTE — Assessment & Plan Note (Signed)
Increased cough and congestion as outlined - exacerbation.  advair bid.  Albuterol prn.  Treat with prednisone taper as directed.  rx given for omnicef if needed.  Call with update.

## 2020-02-24 DIAGNOSIS — E119 Type 2 diabetes mellitus without complications: Secondary | ICD-10-CM | POA: Diagnosis not present

## 2020-02-26 ENCOUNTER — Telehealth: Payer: Self-pay | Admitting: Internal Medicine

## 2020-02-26 NOTE — Telephone Encounter (Signed)
Pt called to see if we received a fax from medicare about new meter. Please advise

## 2020-02-27 ENCOUNTER — Other Ambulatory Visit: Payer: Self-pay

## 2020-02-27 ENCOUNTER — Other Ambulatory Visit: Payer: Self-pay | Admitting: Physician Assistant

## 2020-02-27 NOTE — Telephone Encounter (Signed)
I spoke with patient to see if she knew what type of meter medicare was requesting to be sent in. Patient was unsure, but knew that it was one that she did not have to stick her finger any longer. I told her I thought that it might be the freestyle [employer]. I told her we would be looking out for that fax, but we could just send in if okay with Dr. Lorin Picket to [facility]. Patient was not really sure & I told her that we would wait to see fax.

## 2020-03-10 DIAGNOSIS — E559 Vitamin D deficiency, unspecified: Secondary | ICD-10-CM | POA: Diagnosis not present

## 2020-03-10 DIAGNOSIS — M810 Age-related osteoporosis without current pathological fracture: Secondary | ICD-10-CM | POA: Diagnosis not present

## 2020-03-11 ENCOUNTER — Other Ambulatory Visit: Payer: Medicare Other

## 2020-03-13 ENCOUNTER — Ambulatory Visit: Payer: Medicare Other | Admitting: Internal Medicine

## 2020-03-17 DIAGNOSIS — E559 Vitamin D deficiency, unspecified: Secondary | ICD-10-CM | POA: Diagnosis not present

## 2020-03-17 DIAGNOSIS — M810 Age-related osteoporosis without current pathological fracture: Secondary | ICD-10-CM | POA: Diagnosis not present

## 2020-03-23 ENCOUNTER — Other Ambulatory Visit (INDEPENDENT_AMBULATORY_CARE_PROVIDER_SITE_OTHER): Payer: Medicare Other

## 2020-03-23 ENCOUNTER — Other Ambulatory Visit: Payer: Self-pay

## 2020-03-23 DIAGNOSIS — I10 Essential (primary) hypertension: Secondary | ICD-10-CM | POA: Diagnosis not present

## 2020-03-23 DIAGNOSIS — E1159 Type 2 diabetes mellitus with other circulatory complications: Secondary | ICD-10-CM | POA: Diagnosis not present

## 2020-03-23 DIAGNOSIS — D72829 Elevated white blood cell count, unspecified: Secondary | ICD-10-CM | POA: Diagnosis not present

## 2020-03-23 DIAGNOSIS — E7800 Pure hypercholesterolemia, unspecified: Secondary | ICD-10-CM | POA: Diagnosis not present

## 2020-03-23 LAB — CBC WITH DIFFERENTIAL/PLATELET
Basophils Absolute: 0.1 10*3/uL (ref 0.0–0.1)
Basophils Relative: 0.8 % (ref 0.0–3.0)
Eosinophils Absolute: 1.5 10*3/uL — ABNORMAL HIGH (ref 0.0–0.7)
Eosinophils Relative: 14.3 % — ABNORMAL HIGH (ref 0.0–5.0)
HCT: 26 % — ABNORMAL LOW (ref 36.0–46.0)
Hemoglobin: 8.4 g/dL — ABNORMAL LOW (ref 12.0–15.0)
Lymphocytes Relative: 12 % (ref 12.0–46.0)
Lymphs Abs: 1.3 10*3/uL (ref 0.7–4.0)
MCHC: 32.3 g/dL (ref 30.0–36.0)
MCV: 82 fl (ref 78.0–100.0)
Monocytes Absolute: 0.7 10*3/uL (ref 0.1–1.0)
Monocytes Relative: 6.5 % (ref 3.0–12.0)
Neutro Abs: 7 10*3/uL (ref 1.4–7.7)
Neutrophils Relative %: 66.4 % (ref 43.0–77.0)
Platelets: 437 10*3/uL — ABNORMAL HIGH (ref 150.0–400.0)
RBC: 3.17 Mil/uL — ABNORMAL LOW (ref 3.87–5.11)
RDW: 16.1 % — ABNORMAL HIGH (ref 11.5–15.5)
WBC: 10.5 10*3/uL (ref 4.0–10.5)

## 2020-03-23 LAB — BASIC METABOLIC PANEL
BUN: 12 mg/dL (ref 6–23)
CO2: 26 mEq/L (ref 19–32)
Calcium: 9 mg/dL (ref 8.4–10.5)
Chloride: 106 mEq/L (ref 96–112)
Creatinine, Ser: 0.72 mg/dL (ref 0.40–1.20)
GFR: 77.03 mL/min (ref >60.00)
Glucose, Bld: 164 mg/dL — ABNORMAL HIGH (ref 70–99)
Potassium: 4.2 mEq/L (ref 3.5–5.1)
Sodium: 142 mEq/L (ref 135–145)

## 2020-03-23 LAB — HEPATIC FUNCTION PANEL
ALT: 8 U/L (ref 0–35)
AST: 11 U/L (ref 0–37)
Albumin: 4.2 g/dL (ref 3.5–5.2)
Alkaline Phosphatase: 66 U/L (ref 39–117)
Bilirubin, Direct: 0.1 mg/dL (ref 0.0–0.3)
Total Bilirubin: 0.4 mg/dL (ref 0.2–1.2)
Total Protein: 6.6 g/dL (ref 6.0–8.3)

## 2020-03-23 LAB — LIPID PANEL
Cholesterol: 169 mg/dL (ref 0–200)
HDL: 48.9 mg/dL (ref >39.00)
LDL Cholesterol: 90 mg/dL (ref 0–99)
NonHDL: 120.51
Total CHOL/HDL Ratio: 3
Triglycerides: 155 mg/dL — ABNORMAL HIGH (ref 0.0–149.0)
VLDL: 31 mg/dL (ref 0.0–40.0)

## 2020-03-23 LAB — HEMOGLOBIN A1C: Hgb A1c MFr Bld: 6.9 % — ABNORMAL HIGH (ref 4.6–6.5)

## 2020-03-24 ENCOUNTER — Other Ambulatory Visit: Payer: Self-pay

## 2020-03-24 ENCOUNTER — Other Ambulatory Visit: Payer: Self-pay | Admitting: Internal Medicine

## 2020-03-24 ENCOUNTER — Other Ambulatory Visit (INDEPENDENT_AMBULATORY_CARE_PROVIDER_SITE_OTHER): Payer: Medicare Other

## 2020-03-24 DIAGNOSIS — D649 Anemia, unspecified: Secondary | ICD-10-CM

## 2020-03-24 NOTE — Progress Notes (Signed)
Orders placed for f/u labs.  

## 2020-03-25 ENCOUNTER — Ambulatory Visit (INDEPENDENT_AMBULATORY_CARE_PROVIDER_SITE_OTHER): Payer: Medicare Other | Admitting: Internal Medicine

## 2020-03-25 ENCOUNTER — Encounter: Payer: Self-pay | Admitting: Internal Medicine

## 2020-03-25 ENCOUNTER — Other Ambulatory Visit: Payer: Self-pay

## 2020-03-25 VITALS — BP 124/72 | HR 91 | Temp 97.7°F | Ht 61.0 in | Wt 119.1 lb

## 2020-03-25 DIAGNOSIS — R0602 Shortness of breath: Secondary | ICD-10-CM | POA: Diagnosis not present

## 2020-03-25 DIAGNOSIS — J4520 Mild intermittent asthma, uncomplicated: Secondary | ICD-10-CM | POA: Diagnosis not present

## 2020-03-25 DIAGNOSIS — E1159 Type 2 diabetes mellitus with other circulatory complications: Secondary | ICD-10-CM

## 2020-03-25 DIAGNOSIS — I10 Essential (primary) hypertension: Secondary | ICD-10-CM | POA: Diagnosis not present

## 2020-03-25 DIAGNOSIS — E7800 Pure hypercholesterolemia, unspecified: Secondary | ICD-10-CM

## 2020-03-25 DIAGNOSIS — E538 Deficiency of other specified B group vitamins: Secondary | ICD-10-CM | POA: Diagnosis not present

## 2020-03-25 DIAGNOSIS — I25118 Atherosclerotic heart disease of native coronary artery with other forms of angina pectoris: Secondary | ICD-10-CM

## 2020-03-25 LAB — CBC WITH DIFFERENTIAL/PLATELET
Basophils Absolute: 0.1 10*3/uL (ref 0.0–0.1)
Basophils Relative: 0.8 % (ref 0.0–3.0)
Eosinophils Absolute: 2.1 10*3/uL — ABNORMAL HIGH (ref 0.0–0.7)
Eosinophils Relative: 17.2 % — ABNORMAL HIGH (ref 0.0–5.0)
HCT: 25.5 % — ABNORMAL LOW (ref 36.0–46.0)
Hemoglobin: 8.3 g/dL — ABNORMAL LOW (ref 12.0–15.0)
Lymphocytes Relative: 18.7 % (ref 12.0–46.0)
Lymphs Abs: 2.3 10*3/uL (ref 0.7–4.0)
MCHC: 32.7 g/dL (ref 30.0–36.0)
MCV: 81.6 fl (ref 78.0–100.0)
Monocytes Absolute: 0.8 10*3/uL (ref 0.1–1.0)
Monocytes Relative: 6.8 % (ref 3.0–12.0)
Neutro Abs: 6.9 10*3/uL (ref 1.4–7.7)
Neutrophils Relative %: 56.5 % (ref 43.0–77.0)
Platelets: 448 10*3/uL — ABNORMAL HIGH (ref 150.0–400.0)
RBC: 3.13 Mil/uL — ABNORMAL LOW (ref 3.87–5.11)
RDW: 15.9 % — ABNORMAL HIGH (ref 11.5–15.5)
WBC: 12.1 10*3/uL — ABNORMAL HIGH (ref 4.0–10.5)

## 2020-03-25 LAB — IBC + FERRITIN
Ferritin: 8.6 ng/mL — ABNORMAL LOW (ref 10.0–291.0)
Iron: 20 ug/dL — ABNORMAL LOW (ref 42–145)
Saturation Ratios: 4.3 % — ABNORMAL LOW (ref 20.0–50.0)
Transferrin: 329 mg/dL (ref 212.0–360.0)

## 2020-03-25 LAB — VITAMIN B12: Vitamin B-12: 537 pg/mL (ref 211–911)

## 2020-03-25 MED ORDER — CYANOCOBALAMIN 1000 MCG/ML IJ SOLN
1000.0000 ug | Freq: Once | INTRAMUSCULAR | Status: AC
  Administered 2020-03-25: 1000 ug via INTRAMUSCULAR

## 2020-03-25 NOTE — Progress Notes (Signed)
[name], female   DOB: August 10, 1933, 86 y.o.   MRN: [id number]   Subjective:    [name], female    DOB: August 10, 1933, 86 y.o.   MRN: [id number]  HPI This visit occurred during the SARS-CoV-2 public health emergency.  Safety protocols were in place, including screening questions prior to the visit, additional usage of staff PPE, and extensive cleaning of exam room while observing appropriate contact time as indicated for disinfecting solutions.  Patient here for a scheduled follow up.  She is accompanied by her daughter.  History obtained from both of them.  Has a history of known CAD and is s/p PCI/DES - mid LAD.  Followed by cardiology.  Also has known reactive airways disease and diabetes.  On recent labs, hgb 8.4 - down from 12.3 on 08/15/19.  Kidney function tests are wnl.  She denies any chest pain.  Does reports some sob.  Notices with exertion.  Daughter reports noticed her being pale.  Some minimal dizziness previously.  No headache.  Eating.  Reports a good appetite.  No nausea or vomiting.  Some occasional diarrhea.  No blood in her stool.  No acid reflux.  No hematuria.  No increasd cough or congestion.  No fever.  No abdominal pain or cramping.     Past Medical History:  Diagnosis Date  . Aortic insufficiency    a. TTE 12/19: EF 55-60%, probable HK of the mid apical anterior septal myocardium, Gr1DD, mild AI, mildly dilated LA  . Asthma   . CAD (coronary artery disease)    a. NSTEMI 12/19; b. LHC 10/08/18: LM minimal luminal irregs, mLAD-1 95% s/p PCI/DES, mLAD-2 60%, LCx mild diffuse disease throughout, RCA minimal luminal irregs  . Diabetes mellitus ([place])   . Hypercholesterolemia   . Hypertension   . Myocardial infarction ([place])   . Osteopenia   . Polymyalgia rheumatica syndrome ([place])   . Reactive airway disease    Past Surgical History:  Procedure Laterality Date  . ABDOMINAL HYSTERECTOMY  1981   prolapse and bleeding, ovaries not  removed  . BREAST EXCISIONAL BIOPSY Right   . CHOLECYSTECTOMY N/A 09/02/2019   Procedure: LAPAROSCOPIC CHOLECYSTECTOMY WITH INTRAOPERATIVE CHOLANGIOGRAM;  Surgeon: Olean Ree, MD;  Location: [facility];  Service: General;  Laterality: N/A;  . CORONARY STENT INTERVENTION N/A 10/08/2018   Procedure: CORONARY STENT INTERVENTION;  Surgeon: Wellington Hampshire, MD;  Location: [place] [facility];  Service: Cardiovascular;  Laterality: N/A;  . ENDOSCOPIC RETROGRADE CHOLANGIOPANCREATOGRAPHY (ERCP) WITH PROPOFOL N/A 08/08/2019   Procedure: ENDOSCOPIC RETROGRADE CHOLANGIOPANCREATOGRAPHY (ERCP) WITH PROPOFOL;  Surgeon: Lucilla Lame, MD;  Location: [facility];  Service: Endoscopy;  Laterality: N/A;  . LEFT HEART CATH AND CORONARY ANGIOGRAPHY N/A 10/08/2018   Procedure: LEFT HEART CATH AND CORONARY ANGIOGRAPHY;  Surgeon: Wellington Hampshire, MD;  Location: [place] [facility];  Service: Cardiovascular;  Laterality: N/A;  . UMBILICAL HERNIA REPAIR  7/94   Family History  Problem Relation Age of Onset  . Heart attack Father   . Arthritis Mother   . Heart disease Mother   . Throat cancer Sister   . Parkinson's disease Sister   . COPD Brother    Social History   Socioeconomic History  . Marital status: Widowed    Spouse name: Not on file  . Number of children: 3  . Years of education: Not on file  . Highest education level: Not on file  Occupational History  . Not on file  Tobacco Use  . Smoking status: Never Smoker  . Smokeless tobacco: Never Used  Substance and Sexual Activity  . Alcohol use: No    Alcohol/week: 0.0 standard drinks  . Drug use: No  . Sexual activity: Not Currently  Other Topics Concern  . Not on file  Social History Narrative  . Not on file   Social Determinants of Health   Financial Resource Strain:   . Difficulty of Paying Living Expenses:   Food Insecurity:   . Worried About Charity fundraiser in the Last Year:   . Arboriculturist in the Last Year:     Transportation Needs:   . Film/video editor (Medical):   Marland Kitchen Lack of Transportation (Non-Medical):   Physical Activity:   . Days of Exercise per Week:   . Minutes of Exercise per Session:   Stress:   . Feeling of Stress :   Social Connections: Slightly Isolated  . Frequency of Communication with Friends and Family: More than three times a week  . Frequency of Social Gatherings with Friends and Family: More than three times a week  . Attends Religious Services: More than 4 times per year  . Active Member of Clubs or Organizations: Yes  . Attends Archivist Meetings: Not on file  . Marital Status: Widowed    Outpatient Encounter Medications as of 03/25/2020  Medication Sig  . ADVAIR DISKUS 250-50 MCG/DOSE AEPB INHALE ONE PUFF BY MOUTH EVERY 12 HOURS. RINSE MOUTH AFTER EACH USE (Patient taking differently: Inhale 1 puff into the lungs 2 (two) times daily. )  . alendronate (FOSAMAX) 70 MG tablet Take 70 mg by mouth once a week.   Marland Kitchen aspirin 81 MG tablet Take 81 mg by mouth daily.  Marland Kitchen atorvastatin (LIPITOR) 40 MG tablet Take 1 tablet (40 mg total) by mouth daily at 6 PM.  . clopidogrel (PLAVIX) 75 MG tablet Take 1 tablet by mouth once daily with breakfast  . cyanocobalamin (,VITAMIN B-12,) 1000 MCG/ML injection Inject into the muscle every 30 (thirty) days.  . fluticasone (FLONASE) 50 MCG/ACT nasal spray Use 2 spray(s) in each nostril once daily  . ibuprofen (ADVIL) 400 MG tablet Take 1 tablet (400 mg total) by mouth every 8 (eight) hours as needed for mild pain or moderate pain.  Marland Kitchen lisinopril (ZESTRIL) 20 MG tablet Take 1 tablet (20 mg total) by mouth daily.  . metFORMIN (GLUCOPHAGE) 500 MG tablet TAKE 1 TABLET BY MOUTH TWICE DAILY WITH A MEAL  . metoprolol tartrate (LOPRESSOR) 25 MG tablet Take 1/2 (one-half) tablet by mouth twice daily  . Multiple Vitamins-Minerals (VISION FORMULA/LUTEIN PO) Take by mouth daily.  Marland Kitchen PROAIR HFA 108 (90 Base) MCG/ACT inhaler Two puffs q 4-6  hours prn  . Vitamin D, Ergocalciferol, (DRISDOL) 50000 units CAPS capsule Take 50,000 Units by mouth once a week.  . [DISCONTINUED] cefdinir (OMNICEF) 300 MG capsule Take 1 capsule (300 mg total) by mouth 2 (two) times daily.  . nitroGLYCERIN (NITROSTAT) 0.4 MG SL tablet Place 1 tablet (0.4 mg total) under the tongue every 5 (five) minutes as needed for chest pain. (Patient not taking: Reported on 03/25/2020)  . [DISCONTINUED] predniSONE (DELTASONE) 10 MG tablet Take 6 tablets x 1 day and then decrease by 1/2 tablet per day until down to zero mg. (Patient not taking: Reported on 03/25/2020)  . [EXPIRED] cyanocobalamin ((VITAMIN B-12)) injection 1,000 mcg    No facility-administered encounter medications on file as of 03/25/2020.  Review of Systems  Constitutional: Negative for appetite change and unexpected weight change.  HENT: Negative for congestion and sinus pressure.   Respiratory: Positive for shortness of breath. Negative for cough and chest tightness.   Cardiovascular: Negative for chest pain, palpitations and leg swelling.  Gastrointestinal: Negative for abdominal pain, nausea and vomiting.       Occasional diarrhea.  No change in bowel.  No blood in her stool. No back tarry stool.   Genitourinary: Negative for difficulty urinating and dysuria.  Musculoskeletal: Negative for joint swelling and myalgias.  Skin: Negative for color change and rash.  Neurological: Negative for light-headedness and headaches.       Previous dizziness.  No dizziness now.    Psychiatric/Behavioral: Negative for agitation and dysphoric mood.       Objective:    Physical Exam Vitals reviewed.  Constitutional:      General: She is not in acute distress.    Appearance: Normal appearance.  HENT:     Head: Normocephalic and atraumatic.     Right Ear: External ear normal.     Left Ear: External ear normal.  Eyes:     General: No scleral icterus.       Right eye: No discharge.        Left eye: No  discharge.     Conjunctiva/sclera: Conjunctivae normal.  Neck:     Thyroid: No thyromegaly.  Cardiovascular:     Rate and Rhythm: Normal rate and regular rhythm.  Pulmonary:     Effort: No respiratory distress.     Breath sounds: Normal breath sounds. No wheezing.  Abdominal:     General: Bowel sounds are normal.     Palpations: Abdomen is soft.     Tenderness: There is no abdominal tenderness.  Musculoskeletal:        General: No swelling or tenderness.     Cervical back: Neck supple.  Lymphadenopathy:     Cervical: No cervical adenopathy.  Skin:    Findings: No erythema or rash.  Neurological:     Mental Status: She is alert.  Psychiatric:        Mood and Affect: Mood normal.        Behavior: Behavior normal.     BP 124/72   Pulse 91   Temp 97.7 F (36.5 C) (Temporal)   Ht '5\' 1"'  (1.549 m)   Wt 119 lb 1.9 oz (54 kg)   SpO2 97%   BMI 22.51 kg/m  Wt Readings from Last 3 Encounters:  03/25/20 119 lb 1.9 oz (54 kg)  01/30/20 116 lb 12.8 oz (53 kg)  01/09/20 115 lb (52.2 kg)     Lab Results  Component Value Date   WBC 12.1 (H) 03/24/2020   HGB 8.3 Repeated and verified X2. (L) 03/24/2020   HCT 25.5 (L) 03/24/2020   PLT 448.0 (H) 03/24/2020   GLUCOSE 164 (H) 03/23/2020   CHOL 169 03/23/2020   TRIG 155.0 (H) 03/23/2020   HDL 48.90 03/23/2020   LDLDIRECT 90.0 09/17/2019   LDLCALC 90 03/23/2020   ALT 8 03/23/2020   AST 11 03/23/2020   NA 142 03/23/2020   K 4.2 03/23/2020   CL 106 03/23/2020   CREATININE 0.72 03/23/2020   BUN 12 03/23/2020   CO2 26 03/23/2020   TSH 3.59 09/17/2019   HGBA1C 6.9 (H) 03/23/2020   MICROALBUR 4.3 (H) 05/24/2019       Assessment & Plan:   Problem List Items Addressed This Visit  Coronary artery disease    Known CAD.  Followed by cardiology.  S/p PCI/DES - mid LAD.  Continue plavix, metoprolol and lisinopril.  With sob as outlined, EKG - SR with artifact - no acute ischemic changes/non specific changes noted.  Get her back  in with cardiology for further evaluation and question of need for further cardiac w/up.        Diabetes mellitus with cardiac complication (HCC)    Low carb diet and exercise.  Continue metformin.  Follow met b and a1c.   Lab Results  Component Value Date   HGBA1C 6.9 (H) 03/23/2020        Hypercholesterolemia    On lipitor.  Low cholesterol diet and exercise.  Follow lipid panel and liver function tests.        Hypertension    Blood pressure on recheck ok.  Continue lisinopril.  Follow pressures.  Follow metabolic panel.       Reactive airway disease    Continue advair.  No increased cough or congestion.  Has rescue inhaler if needed.        SOB (shortness of breath)    Has noticed some sob with exertion.  Given known CAD, EKG - with some artifact.  No acute ischemic changes.  Followed by cardiology.  Will refer back to confirm no further cardiac w/up warranted.  Recent - decrease hgb - 8.4.  Could be contributing to her symptoms.  Recheck cbc today.  Will need GI evaluation.  No increased cough or congestion.  Continue advair.  Rescue inhaler as needed.        Relevant Orders   EKG 12-Lead   Ambulatory referral to Cardiology    Other Visit Diagnoses    B12 deficiency    -  Primary   Relevant Medications   cyanocobalamin ((VITAMIN B-12)) injection 1,000 mcg (Completed)       Einar Pheasant, MD

## 2020-03-26 ENCOUNTER — Other Ambulatory Visit: Payer: Self-pay | Admitting: Internal Medicine

## 2020-03-26 DIAGNOSIS — D509 Iron deficiency anemia, unspecified: Secondary | ICD-10-CM

## 2020-03-26 NOTE — Progress Notes (Signed)
Order placed for GI referral.   

## 2020-03-27 ENCOUNTER — Encounter: Payer: Self-pay | Admitting: Internal Medicine

## 2020-03-27 ENCOUNTER — Telehealth: Payer: Self-pay | Admitting: Internal Medicine

## 2020-03-27 NOTE — Telephone Encounter (Signed)
Pt was seen 5/26. I see where GI referral was placed but not cardiology?

## 2020-03-27 NOTE — Telephone Encounter (Signed)
Pt daughter called to check on heart doctor appt

## 2020-03-28 NOTE — Assessment & Plan Note (Signed)
Has noticed some sob with exertion.  Given known CAD, EKG - with some artifact.  No acute ischemic changes.  Followed by cardiology.  Will refer back to confirm no further cardiac w/up warranted.  Recent - decrease hgb - 8.4.  Could be contributing to her symptoms.  Recheck cbc today.  Will need GI evaluation.  No increased cough or congestion.  Continue advair.  Rescue inhaler as needed.

## 2020-03-28 NOTE — Assessment & Plan Note (Signed)
On lipitor.  Low cholesterol diet and exercise.  Follow lipid panel and liver function tests.   

## 2020-03-28 NOTE — Assessment & Plan Note (Signed)
Low carb diet and exercise.  Continue metformin.  Follow met b and a1c.   Lab Results  Component Value Date   HGBA1C 6.9 (H) 03/23/2020

## 2020-03-28 NOTE — Telephone Encounter (Signed)
Spoke to pt.  She is doing ok.  No chest pain.  Breathing stable.  Just reports fatigue.  Tolerating iron.  Has GI appt scheduled.  Order in for cardiology.

## 2020-03-28 NOTE — Assessment & Plan Note (Signed)
Continue advair.  No increased cough or congestion.  Has rescue inhaler if needed.

## 2020-03-28 NOTE — Assessment & Plan Note (Signed)
Known CAD.  Followed by cardiology.  S/p PCI/DES - mid LAD.  Continue plavix, metoprolol and lisinopril.  With sob as outlined, EKG - SR with artifact - no acute ischemic changes/non specific changes noted.  Get her back in with cardiology for further evaluation and question of need for further cardiac w/up.

## 2020-03-28 NOTE — Assessment & Plan Note (Signed)
Blood pressure on recheck ok.  Continue lisinopril.  Follow pressures.  Follow metabolic panel.

## 2020-04-01 ENCOUNTER — Ambulatory Visit (INDEPENDENT_AMBULATORY_CARE_PROVIDER_SITE_OTHER): Payer: 59 | Admitting: Family

## 2020-04-01 ENCOUNTER — Other Ambulatory Visit: Payer: Self-pay

## 2020-04-01 ENCOUNTER — Encounter: Payer: Self-pay | Admitting: Family

## 2020-04-01 VITALS — BP 120/60 | HR 79 | Ht 61.0 in | Wt 117.0 lb

## 2020-04-01 DIAGNOSIS — I10 Essential (primary) hypertension: Secondary | ICD-10-CM | POA: Diagnosis not present

## 2020-04-01 DIAGNOSIS — D649 Anemia, unspecified: Secondary | ICD-10-CM

## 2020-04-01 DIAGNOSIS — I2510 Atherosclerotic heart disease of native coronary artery without angina pectoris: Secondary | ICD-10-CM | POA: Diagnosis not present

## 2020-04-01 DIAGNOSIS — R0609 Other forms of dyspnea: Secondary | ICD-10-CM

## 2020-04-01 DIAGNOSIS — E782 Mixed hyperlipidemia: Secondary | ICD-10-CM

## 2020-04-01 DIAGNOSIS — R0600 Dyspnea, unspecified: Secondary | ICD-10-CM

## 2020-04-01 MED ORDER — ATORVASTATIN CALCIUM 80 MG PO TABS: 80.0000 mg | ORAL_TABLET | Freq: Every day | ORAL | 3 refills | Status: DC

## 2020-04-01 NOTE — Progress Notes (Signed)
Office Visit    Patient Name: Tanya Date of Encounter: 04/01/2020  Primary Care Provider:  Einar Pheasant, MD Primary Cardiologist:  Kathlyn Sacramento, MD Electrophysiologist:  None   Chief Complaint    Tanya Cole is a 86 y.o. female with a hx of CAD, HLD, DM2, HTN, reactive airway disease presents today for dyspnea.   Past Medical History    Past Medical History:  Diagnosis Date   Aortic insufficiency    a. TTE 12/19: EF 55-60%, probable HK of the mid apical anterior septal myocardium, Gr1DD, mild AI, mildly dilated LA   Asthma    CAD (coronary artery disease)    a. NSTEMI 12/19; b. LHC 10/08/18: LM minimal luminal irregs, mLAD-1 95% s/p PCI/DES, mLAD-2 60%, LCx mild diffuse disease throughout, RCA minimal luminal irregs   Diabetes mellitus (HCC)    Hypercholesterolemia    Hypertension    Myocardial infarction ([place])    Osteopenia    Polymyalgia rheumatica syndrome ([place])    Reactive airway disease    Past Surgical History:  Procedure Laterality Date   ABDOMINAL HYSTERECTOMY  1981   prolapse and bleeding, ovaries not removed   BREAST EXCISIONAL BIOPSY Right    CHOLECYSTECTOMY N/A 09/02/2019   Procedure: LAPAROSCOPIC CHOLECYSTECTOMY WITH INTRAOPERATIVE CHOLANGIOGRAM;  Surgeon: Olean Ree, MD;  Location: [facility];  Service: General;  Laterality: N/A;   CORONARY STENT INTERVENTION N/A 10/08/2018   Procedure: CORONARY STENT INTERVENTION;  Surgeon: Wellington Hampshire, MD;  Location: [place] [facility];  Service: Cardiovascular;  Laterality: N/A;   ENDOSCOPIC RETROGRADE CHOLANGIOPANCREATOGRAPHY (ERCP) WITH PROPOFOL N/A 08/08/2019   Procedure: ENDOSCOPIC RETROGRADE CHOLANGIOPANCREATOGRAPHY (ERCP) WITH PROPOFOL;  Surgeon: Lucilla Lame, MD;  Location: [facility];  Service: Endoscopy;  Laterality: N/A;   LEFT HEART CATH AND CORONARY ANGIOGRAPHY N/A 10/08/2018   Procedure: LEFT HEART CATH AND CORONARY ANGIOGRAPHY;  Surgeon: Wellington Hampshire, MD;   Location: [place] [facility];  Service: Cardiovascular;  Laterality: N/A;   UMBILICAL HERNIA REPAIR  7/94    Allergies  Allergies  Allergen Reactions   Tramadol Itching and Nausea And Vomiting    History of Present Illness    Tanya Cole is a 86 y.o. female with a hx of CAD, HLD, DM2, HTN, reactive airway disease last seen 11/26/19 by Dr. Fletcher Cole.  CAD s/p PCI and DES to midLAD in December 2019 after presenting with NSTEMI. Hospitalized 12/2018 with atypical chest pain, negative troponing. Lexiscan myoview 12/2018 with noe vidence of ischemia and normal LVEF.  Seen by PCP fo rDOE and asked to be seen by cardiology. Noted Hb of 8.4 with new onset anemia. She has been referred to GI and has upcoming appointment to establish care next month. No melena, hematuria noted.   Worsening DOE. No SOB at rest. No orthopnea, PND, LE edema. Endorses following low sodium, heart healthy diet.   Anginal equivalent of indigestion back in 2019. Her daughter tells me at the time she kept saying "something isn't right". When asked if she has had similar symptoms to her anginal equivalent she describes them as "not as bad".   EKGs/Labs/Other Studies Reviewed:   The following studies were reviewed today:  EKG:  EKG is ordered today.  The ekg ordered today demonstrates NSR 79 bpm with no acute ST/T wave changes.  Recent Labs: 09/17/2019: TSH 3.59 03/23/2020: ALT 8; BUN 12; Creatinine, Ser 0.72; Potassium 4.2; Sodium 142 03/24/2020: Hemoglobin 8.3 Repeated and verified X2.; Platelets 448.0  Recent Lipid Panel  Component Value Date/Time   CHOL 169 03/23/2020 0912   TRIG 155.0 (H) 03/23/2020 0912   HDL 48.90 03/23/2020 0912   CHOLHDL 3 03/23/2020 0912   VLDL 31.0 03/23/2020 0912   LDLCALC 90 03/23/2020 0912   LDLDIRECT 90.0 09/17/2019 1504    Home Medications   Current Meds  Medication Sig   ADVAIR DISKUS 250-50 MCG/DOSE AEPB INHALE ONE PUFF BY MOUTH EVERY 12 HOURS. RINSE MOUTH AFTER  EACH USE (Patient taking differently: Inhale 1 puff into the lungs 2 (two) times daily. )   alendronate (FOSAMAX) 70 MG tablet Take 70 mg by mouth once a week.    aspirin 81 MG tablet Take 81 mg by mouth daily.   atorvastatin (LIPITOR) 40 MG tablet Take 1 tablet (40 mg total) by mouth daily at 6 PM.   clopidogrel (PLAVIX) 75 MG tablet Take 1 tablet by mouth once daily with breakfast   cyanocobalamin (,VITAMIN B-12,) 1000 MCG/ML injection Inject into the muscle every 30 (thirty) days.   ferrous sulfate 325 (65 FE) MG tablet Take 325 mg by mouth daily with breakfast.   fluticasone (FLONASE) 50 MCG/ACT nasal spray Use 2 spray(s) in each nostril once daily   ibuprofen (ADVIL) 400 MG tablet Take 1 tablet (400 mg total) by mouth every 8 (eight) hours as needed for mild pain or moderate pain.   lisinopril (ZESTRIL) 20 MG tablet Take 1 tablet (20 mg total) by mouth daily.   metFORMIN (GLUCOPHAGE) 500 MG tablet TAKE 1 TABLET BY MOUTH TWICE DAILY WITH A MEAL   metoprolol tartrate (LOPRESSOR) 25 MG tablet Take 1/2 (one-half) tablet by mouth twice daily   Multiple Vitamins-Minerals (VISION FORMULA/LUTEIN PO) Take by mouth daily.   nitroGLYCERIN (NITROSTAT) 0.4 MG SL tablet Place 1 tablet (0.4 mg total) under the tongue every 5 (five) minutes as needed for chest pain.   PROAIR HFA 108 (90 Base) MCG/ACT inhaler Two puffs q 4-6 hours prn   Vitamin D, Ergocalciferol, (DRISDOL) 50000 units CAPS capsule Take 50,000 Units by mouth once a week.      Review of Systems       Review of Systems  Constitution: Negative for chills, fever and malaise/fatigue.  Cardiovascular: Positive for dyspnea on exertion. Negative for chest pain, leg swelling, near-syncope, orthopnea, palpitations and syncope.  Respiratory: Negative for cough, shortness of breath and wheezing.   Gastrointestinal: Positive for heartburn. Negative for nausea and vomiting.  Neurological: Negative for dizziness, light-headedness and  weakness.   All other systems reviewed and are otherwise negative except as noted above.  Physical Exam    VS:  BP 120/60 (BP Location: Left Arm, Patient Position: Sitting, Cuff Size: Normal)    Pulse 79    Ht 5\' 1"  (1.549 m)    Wt 117 lb (53.1 kg)    SpO2 97%    BMI 22.11 kg/m  , BMI Body mass index is 22.11 kg/m. GEN: Well nourished, well developed, in no acute distress. HEENT: normal. Neck: Supple, no JVD, carotid bruits, or masses. Cardiac: RRR, no murmurs, rubs, or gallops. No clubbing, cyanosis, edema.  Radials/DP/PT 2+ and equal bilaterally.  Respiratory:  Respirations regular and unlabored, clear to auscultation bilaterally. GI: Soft, nontender, nondistended, BS + x 4. MS: No deformity or atrophy. Skin: Warm and dry, no rash. Neuro:  Strength and sensation are intact. Psych: Normal affect.  Assessment & Plan    1. CAD - Continue DAPT aspirin/Plavix. Denies hematuria, melena. Pending findings of upcoming GI workup may need to  be interrupted. Per Dr. Jari Sportsman previous documentation plan to discontinue clopidogrel at the end of 2021. EKG today without changes. Increased DOE over the last month. Endorses some symptoms similar to her anginal equivalent of "indigestion" but "not as bad". Plan for [facility]. If [employer] may consider echocardiogram for evaluation of DOE.  2. Anemia - Recent Hb 8.4. Previous baseline of Hb 11. NO hematuria, melena. Upcoming appointment with GI. Likely contributory to her DOE.  3. HTN - BP well controlled.  4. HLD - LDL of 90 on 03/23/20. Increase Atorvastatin to 80mg  daily. Plan for repeat lipid panel at follow up in 2 months. If LDL remains >70 plan to add Zetia 10mg  daily.  Disposition: Follow up in 2 month(s) with Dr. or APP.    , NP 04/01/2020, 2:14 PM

## 2020-04-01 NOTE — Patient Instructions (Signed)
Medication Instructions:  1- INCREASE Atorvastatin to 80 mg once daily in the evening *If you need a refill on your cardiac medications before your next appointment, please call your pharmacy*   Lab Work: Your physician recommends that you return for lab work (1 day prior to office visit July 29) at the medical mall. You will need to be fasting.  No appt is needed. Hours are M-F 7AM- 6 PM.  If you have labs (blood work) drawn today and your tests are completely normal, you will receive your results only by: Marland Kitchen MyChart Message (if you have MyChart) OR . A paper copy in the mail If you have any lab test that is abnormal or we need to change your treatment, we will call you to review the results.   Testing/Procedures: 1- [facility] MYOVIEW  Your caregiver has ordered a Stress Test with nuclear imaging. The purpose of this test is to evaluate the blood supply to your heart muscle. This procedure is referred to as a "Non-Invasive Stress Test." This is because other than having an IV started in your vein, nothing is inserted or "invades" your body. Cardiac stress tests are done to find areas of poor blood flow to the heart by determining the extent of coronary artery disease (CAD). Some patients exercise on a treadmill, which naturally increases the blood flow to your heart, while others who are  unable to walk on a treadmill due to physical limitations have a pharmacologic/chemical stress agent called Lexiscan . This medicine will mimic walking on a treadmill by temporarily increasing your coronary blood flow.   Please note: these test may take anywhere between 2-4 hours to complete  PLEASE REPORT TO [facility] MEDICAL MALL ENTRANCE  THE VOLUNTEERS AT THE FIRST DESK WILL DIRECT YOU WHERE TO GO  Date of Procedure:_____________________________________  Arrival Time for Procedure:______________________________  Instructions regarding medication:   __x__ : Hold diabetes medication morning of procedure (hold  12 hours prior)  __x__:  Hold betablocker(s) night before procedure and morning of procedure (metoprolol)  __  PLEASE NOTIFY THE OFFICE AT LEAST 24 HOURS IN ADVANCE IF YOU ARE UNABLE TO KEEP YOUR APPOINTMENT.  [phone] AND  PLEASE NOTIFY NUCLEAR MEDICINE AT [facility] AT LEAST 24 HOURS IN ADVANCE IF YOU ARE UNABLE TO KEEP YOUR APPOINTMENT. [phone]  How to prepare for your Myoview test:  1. Do not eat or drink after midnight 2. No caffeine for 24 hours prior to test 3. No smoking 24 hours prior to test. 4. Your medication may be taken with water.  If your doctor stopped a medication because of this test, do not take that medication. 5. Ladies, please do not wear dresses.  Skirts or pants are appropriate. Please wear a short sleeve shirt. 6. No perfume, cologne or lotion. 7. Wear comfortable walking shoes. No heels!    Follow-Up: At [facility], you and your health needs are our priority.  As part of our continuing mission to provide you with exceptional heart care, we have created designated Provider Care Teams.  These Care Teams include your primary Cardiologist (physician) and Advanced Practice Providers (APPs -  Physician Assistants and Nurse Practitioners) who all work together to provide you with the care you need, when you need it.  We recommend signing up for the patient portal called "MyChart".  Sign up information is provided on this After Visit Summary.  MyChart is used to connect with patients for Virtual Visits (Telemedicine).  Patients are able to view lab/test results, encounter  notes, upcoming appointments, etc.  Non-urgent messages can be sent to your provider as well.   To learn more about what you can do with MyChart, go to [email or web address].    Your next appointment:   As scheduled   Other Instructions

## 2020-04-03 ENCOUNTER — Telehealth: Payer: Self-pay | Admitting: *Deleted

## 2020-04-03 DIAGNOSIS — D509 Iron deficiency anemia, unspecified: Secondary | ICD-10-CM

## 2020-04-03 NOTE — Telephone Encounter (Signed)
Please place future orders for lab appt.  

## 2020-04-04 DIAGNOSIS — D649 Anemia, unspecified: Secondary | ICD-10-CM | POA: Insufficient documentation

## 2020-04-04 NOTE — Telephone Encounter (Signed)
Orders placed for f/u labs.  

## 2020-04-06 ENCOUNTER — Other Ambulatory Visit: Payer: Self-pay

## 2020-04-06 ENCOUNTER — Encounter: Payer: Self-pay | Admitting: *Deleted

## 2020-04-06 ENCOUNTER — Emergency Department
Admission: EM | Admit: 2020-04-06 | Discharge: 2020-04-07 | Disposition: A | Discharge status: Home / Self Care | Payer: 59 | Attending: Emergency Medicine | Admitting: Emergency Medicine

## 2020-04-06 ENCOUNTER — Emergency Department: Payer: 59

## 2020-04-06 DIAGNOSIS — I2510 Atherosclerotic heart disease of native coronary artery without angina pectoris: Secondary | ICD-10-CM | POA: Diagnosis not present

## 2020-04-06 DIAGNOSIS — E1165 Type 2 diabetes mellitus with hyperglycemia: Secondary | ICD-10-CM | POA: Diagnosis not present

## 2020-04-06 DIAGNOSIS — Z7984 Long term (current) use of oral hypoglycemic drugs: Secondary | ICD-10-CM | POA: Diagnosis not present

## 2020-04-06 DIAGNOSIS — I10 Essential (primary) hypertension: Secondary | ICD-10-CM | POA: Diagnosis not present

## 2020-04-06 DIAGNOSIS — J441 Chronic obstructive pulmonary disease with (acute) exacerbation: Secondary | ICD-10-CM | POA: Diagnosis not present

## 2020-04-06 DIAGNOSIS — Z79899 Other long term (current) drug therapy: Secondary | ICD-10-CM | POA: Insufficient documentation

## 2020-04-06 DIAGNOSIS — R0602 Shortness of breath: Secondary | ICD-10-CM

## 2020-04-06 LAB — CBC
HCT: 31 % — ABNORMAL LOW (ref 36.0–46.0)
Hemoglobin: 9.4 g/dL — ABNORMAL LOW (ref 12.0–15.0)
MCH: 25.6 pg — ABNORMAL LOW (ref 26.0–34.0)
MCHC: 30.3 g/dL (ref 30.0–36.0)
MCV: 84.5 fL (ref 80.0–100.0)
Platelets: 434 10*3/uL — ABNORMAL HIGH (ref 150–400)
RBC: 3.67 MIL/uL — ABNORMAL LOW (ref 3.87–5.11)
RDW: 16.6 % — ABNORMAL HIGH (ref 11.5–15.5)
WBC: 12.6 10*3/uL — ABNORMAL HIGH (ref 4.0–10.5)
nRBC: 0 % (ref 0.0–0.2)

## 2020-04-06 LAB — TROPONIN I (HIGH SENSITIVITY)
Troponin I (High Sensitivity): 6 ng/L (ref <18)
Troponin I (High Sensitivity): 6 ng/L (ref <18)

## 2020-04-06 LAB — BASIC METABOLIC PANEL
Anion gap: 10 (ref 5–15)
BUN: 13 mg/dL (ref 8–23)
CO2: 23 mmol/L (ref 22–32)
Calcium: 9 mg/dL (ref 8.9–10.3)
Chloride: 106 mmol/L (ref 98–111)
Creatinine, Ser: 0.71 mg/dL (ref 0.44–1.00)
GFR calc Af Amer: >60 mL/min (ref >60)
GFR calc non Af Amer: >60 mL/min (ref >60)
Glucose, Bld: 160 mg/dL — ABNORMAL HIGH (ref 70–99)
Potassium: 4.5 mmol/L (ref 3.5–5.1)
Sodium: 139 mmol/L (ref 135–145)

## 2020-04-06 MED ORDER — SODIUM CHLORIDE 0.9% FLUSH: 3.0000 mL | Freq: Once | INTRAVENOUS | Status: DC

## 2020-04-06 MED ORDER — AZITHROMYCIN 250 MG PO TABS: ORAL_TABLET | ORAL | 0 refills | Status: AC

## 2020-04-06 MED ORDER — PREDNISONE 20 MG PO TABS: 60.0000 mg | ORAL_TABLET | Freq: Every day | ORAL | 0 refills | Status: AC

## 2020-04-06 NOTE — ED Triage Notes (Signed)
First Nurse Note:  C/O SOB and wheezing.  Arrives via [facility].  Hx oc COPD.  1 Duo neb and 125 Solumedrol given.  20g LAC started PTA.

## 2020-04-06 NOTE — Discharge Instructions (Signed)
Take the prednisone and azithromycin as prescribed starting the day after the ER visit.  Use your albuterol (ProAir) inhaler up to every 4 hours for shortness of breath for the next few days.  Return to the ER for new, worsening, or persistent severe shortness of breath, fever, chest pain, weakness, or any other new or worsening symptoms that concern you.

## 2020-04-06 NOTE — ED Notes (Signed)
Pt reports feeling sob tonight.  No chest pain.  Brought in by ems today from home.  Hx copd.  No fever no cough.  Pt alert  Speech clear.  nsr on monitor.

## 2020-04-06 NOTE — ED Triage Notes (Signed)
Pt brought in via ems from home with sob.  Pt has chest tightness.  Pt has an iv.  meds given by ems.  States breathing treatment helped.  Pt alert  Speech clear.  Sx since yesterday.

## 2020-04-06 NOTE — ED Notes (Signed)
Report off to paige rn  

## 2020-04-06 NOTE — ED Provider Notes (Signed)
[facility] Emergency Department Provider Note ____________________________________________   First MD Initiated Contact with Patient 04/06/20 2328     (approximate)  I have reviewed the triage vital signs and the nursing notes.   HISTORY  Chief Complaint Shortness of Breath    HPI Tanya Cole is a 86 y.o. female with PMH as noted below including CAD, diabetes, and COPD who presents with shortness of breath over the last several days, gradual onset, worse with exertion, and not relieved by her normal ProAir and Advair at home.  She reports an increased cough which is not significantly productive.  She has had no fever or chills.  She denies any vomiting or diarrhea, and has no chest pain.  The patient states that she was recently diagnosed with anemia and thinks it may be related to this.  Past Medical History:  Diagnosis Date  . Aortic insufficiency    a. TTE 12/19: EF 55-60%, probable HK of the mid apical anterior septal myocardium, Gr1DD, mild AI, mildly dilated LA  . Asthma   . CAD (coronary artery disease)    a. NSTEMI 12/19; b. LHC 10/08/18: LM minimal luminal irregs, mLAD-1 95% s/p PCI/DES, mLAD-2 60%, LCx mild diffuse disease throughout, RCA minimal luminal irregs  . Diabetes mellitus (HCC)   . Hypercholesterolemia   . Hypertension   . Myocardial infarction (HCC)   . Osteopenia   . Polymyalgia rheumatica syndrome (HCC)   . Reactive airway disease     Patient Active Problem List   Diagnosis Date Noted  . Anemia 04/04/2020  . SOB (shortness of breath) 03/25/2020  . Cough 09/30/2019  . Gallstone pancreatitis   . Pre-op evaluation 08/30/2019  . Cholecystitis 08/05/2019  . Choledocholithiasis   . Respiratory illness 06/18/2019  . Coronary artery disease 12/29/2018  . Non-ST elevation (NSTEMI) myocardial infarction (HCC)   . Chest pain 10/07/2018  . Memory change 05/27/2018  . Osteoporosis 02/05/2018  . Weight loss 06/24/2017  .  Abdominal pain, left lower quadrant 10/05/2016  . Long term current use of systemic steroids 05/16/2016  . Elevated erythrocyte sedimentation rate 05/04/2016  . Back pain 04/29/2016  . Fatigue 05/24/2015  . Health care maintenance 01/25/2015  . UTI (urinary tract infection) 07/07/2014  . Neuropathy 03/24/2014  . Diverticulitis 02/24/2013  . Reactive airway disease 09/15/2012  . Hypertension 09/14/2012  . Hypercholesterolemia 09/14/2012  . Diabetes mellitus with cardiac complication (HCC) 09/14/2012    Past Surgical History:  Procedure Laterality Date  . ABDOMINAL HYSTERECTOMY  1981   prolapse and bleeding, ovaries not removed  . BREAST EXCISIONAL BIOPSY Right   . CHOLECYSTECTOMY N/A 09/02/2019   Procedure: LAPAROSCOPIC CHOLECYSTECTOMY WITH INTRAOPERATIVE CHOLANGIOGRAM;  Surgeon: Henrene Dodge, MD;  Location: [facility] ORS;  Service: General;  Laterality: N/A;  . CORONARY STENT INTERVENTION N/A 10/08/2018   Procedure: CORONARY STENT INTERVENTION;  Surgeon: Iran Ouch, MD;  Location: [facility] INVASIVE CV LAB;  Service: Cardiovascular;  Laterality: N/A;  . ENDOSCOPIC RETROGRADE CHOLANGIOPANCREATOGRAPHY (ERCP) WITH PROPOFOL N/A 08/08/2019   Procedure: ENDOSCOPIC RETROGRADE CHOLANGIOPANCREATOGRAPHY (ERCP) WITH PROPOFOL;  Surgeon: Midge Minium, MD;  Location: [facility] ENDOSCOPY;  Service: Endoscopy;  Laterality: N/A;  . LEFT HEART CATH AND CORONARY ANGIOGRAPHY N/A 10/08/2018   Procedure: LEFT HEART CATH AND CORONARY ANGIOGRAPHY;  Surgeon: Iran Ouch, MD;  Location: [facility] INVASIVE CV LAB;  Service: Cardiovascular;  Laterality: N/A;  . UMBILICAL HERNIA REPAIR  7/94    Prior to Admission medications   Medication Sig Start Date End Date  Taking? Authorizing Provider  ADVAIR DISKUS 250-50 MCG/DOSE AEPB INHALE ONE PUFF BY MOUTH EVERY 12 HOURS. RINSE MOUTH AFTER EACH USE Patient taking differently: Inhale 1 puff into the lungs 2 (two) times daily.  02/17/16   Einar Pheasant, MD  alendronate  (FOSAMAX) 70 MG tablet Take 70 mg by mouth once a week.  11/06/17   [provider]  aspirin 81 MG tablet Take 81 mg by mouth daily.    [provider]  atorvastatin (LIPITOR) 80 MG tablet Take 1 tablet (80 mg total) by mouth daily at 6 PM. 04/01/20   Loel Dubonnet, NP  azithromycin (ZITHROMAX Z-PAK) 250 MG tablet Take 2 tablets (500 mg) on  Day 1,  followed by 1 tablet (250 mg) once daily on Days 2 through 5. 04/06/20 04/11/20  Arta Silence, MD  clopidogrel (PLAVIX) 75 MG tablet Take 1 tablet by mouth once daily with breakfast 02/27/20   Dunn, Areta Haber, PA-C  cyanocobalamin (,VITAMIN B-12,) 1000 MCG/ML injection Inject into the muscle every 30 (thirty) days.    [provider]  ferrous sulfate 325 (65 FE) MG tablet Take 325 mg by mouth daily with breakfast.    [provider]  fluticasone (FLONASE) 50 MCG/ACT nasal spray Use 2 spray(s) in each nostril once daily 01/24/20   Einar Pheasant, MD  ibuprofen (ADVIL) 400 MG tablet Take 1 tablet (400 mg total) by mouth every 8 (eight) hours as needed for mild pain or moderate pain. 09/02/19   Olean Ree, MD  lisinopril (ZESTRIL) 20 MG tablet Take 1 tablet (20 mg total) by mouth daily. 04/15/19   Dunn, Areta Haber, PA-C  metFORMIN (GLUCOPHAGE) 500 MG tablet TAKE 1 TABLET BY MOUTH TWICE DAILY WITH A MEAL 01/06/20   Einar Pheasant, MD  metoprolol tartrate (LOPRESSOR) 25 MG tablet Take 1/2 (one-half) tablet by mouth twice daily 02/27/20   Rise Mu, PA-C  Multiple Vitamins-Minerals (VISION FORMULA/LUTEIN PO) Take by mouth daily.    [provider]  nitroGLYCERIN (NITROSTAT) 0.4 MG SL tablet Place 1 tablet (0.4 mg total) under the tongue every 5 (five) minutes as needed for chest pain. 10/09/18   Bettey Costa, MD  predniSONE (DELTASONE) 20 MG tablet Take 3 tablets (60 mg total) by mouth daily for 5 days. 04/06/20 04/11/20  Arta Silence, MD  PROAIR HFA 108 [id number] Base) MCG/ACT inhaler Two puffs q 4-6 hours prn 12/10/19    Einar Pheasant, MD  Vitamin D, Ergocalciferol, (DRISDOL) 50000 units CAPS capsule Take 50,000 Units by mouth once a week. 06/24/18   [provider]    Allergies Tramadol  Family History  Problem Relation Age of Onset  . Heart attack Father   . Arthritis Mother   . Heart disease Mother   . Throat cancer Sister   . Parkinson's disease Sister   . COPD Brother     Social History Social History   Tobacco Use  . Smoking status: Never Smoker  . Smokeless tobacco: Never Used  Substance Use Topics  . Alcohol use: No    Alcohol/week: 0.0 standard drinks  . Drug use: No    Review of Systems  Constitutional: No fever/chills. Eyes: No redness. ENT: No sore throat. Cardiovascular: Denies chest pain. Respiratory: Positive for shortness of breath. Gastrointestinal: No vomiting or diarrhea.  Genitourinary: Negative for dysuria.  Musculoskeletal: Negative for back pain. Skin: Negative for rash. Neurological: Negative for headache.   ____________________________________________   PHYSICAL EXAM:  VITAL SIGNS: ED Triage Vitals  Enc Vitals  Group     BP 04/06/20 1820 (!) 171/59     Pulse Rate 04/06/20 1820 84     Resp 04/06/20 1820 20     Temp 04/06/20 1820 99.3 F (37.4 C)     Temp Source 04/06/20 1820 Oral     SpO2 04/06/20 1820 98 %     Weight 04/06/20 1821 117 lb (53.1 kg)     Height 04/06/20 1821 5\' 1"  (1.549 m)     Head Circumference --      Peak Flow --      Pain Score 04/06/20 1821 8     Pain Loc --      Pain Edu? --      Excl. in GC? --     Constitutional: Alert and oriented.  Relatively well appearing and in no acute distress. Eyes: Conjunctivae are normal.  Head: Atraumatic. Nose: No congestion/rhinnorhea. Mouth/Throat: Mucous membranes are moist.   Neck: Normal range of motion.  Cardiovascular: Normal rate, regular rhythm. Grossly normal heart sounds.  Good peripheral circulation. Respiratory: Normal respiratory effort.  No retractions. Lungs  CTAB. Gastrointestinal: Soft and nontender. No distention.  Genitourinary: No flank tenderness. Musculoskeletal: No lower extremity edema.  Extremities warm and well perfused.  Neurologic:  Normal speech and language. No gross focal neurologic deficits are appreciated.  Skin:  Skin is warm and dry. No rash noted. Psychiatric: Mood and affect are normal. Speech and behavior are normal.  ____________________________________________   LABS (all labs ordered are listed, but only abnormal results are displayed)  Labs Reviewed  BASIC METABOLIC PANEL - Abnormal; Notable for the following components:      Result Value   Glucose, Bld 160 (*)    All other components within normal limits  CBC - Abnormal; Notable for the following components:   WBC 12.6 (*)    RBC 3.67 (*)    Hemoglobin 9.4 (*)    HCT 31.0 (*)    MCH 25.6 (*)    RDW 16.6 (*)    Platelets 434 (*)    All other components within normal limits  TROPONIN I (HIGH SENSITIVITY)  TROPONIN I (HIGH SENSITIVITY)   ____________________________________________  EKG  ED ECG REPORT I, 06/06/20, the attending physician, personally viewed and interpreted this ECG.  Date: 04/06/2020 EKG Time: 1821 Rate: 85 Rhythm: normal sinus rhythm QRS Axis: normal Intervals: normal ST/T Wave abnormalities: normal Narrative Interpretation: no evidence of acute ischemia  ____________________________________________  RADIOLOGY  CXR: No focal infiltrate or other acute abnormality  ____________________________________________   PROCEDURES  Procedure(s) performed: No  Procedures  Critical Care performed: No ____________________________________________   INITIAL IMPRESSION / ASSESSMENT AND PLAN / ED COURSE  Pertinent labs & imaging results that were available during my care of the patient were reviewed by me and considered in my medical decision making (see chart for details).  86 year old female with PMH as noted above  including a history of COPD presents with shortness of breath over the last several days, worsening today, and associated with some increased cough.  She denies any chest pain or fever.  She states she was recently diagnosed with anemia and has been on iron supplementation.  The patient received Solu-Medrol and a DuoNeb by EMS and states that she feels significantly better now.  She waited about 5 hours in the waiting room prior to being seen, and had no recurrence or worsening of her symptoms during this time.  I reviewed the past medical records in Epic.  The patient  was most recently seen in the ED in October due to abdominal pain.  The patient was recently diagnosed with anemia and was started on iron supplementation.  She has follow-up labs scheduled tomorrow.  She has also been referred to GI.  On exam, the patient is relatively comfortable appearing.  She is slightly tachypneic in the high 20s and has a borderline elevated temperature.  O2 saturation is in the high 90s on room air.  She does not have increased work of breathing or accessory muscle use.  She is speaking in full sentences.  The lungs are clear bilaterally.  The remainder of the exam is unremarkable.  Chest x-ray, EKG, and lab work-up were obtained prior to my evaluating the patient.  The chest x-ray shows no focal infiltrate.  EKG shows no acute findings.  Lab work-up is significant for a WBC count of 12.6 and hemoglobin of 9.4.  The patient's baseline appears to be around 12, however 9.4 is an improvement from 2 weeks ago when she was at 8.3.  Troponins x2 are negative.  Given the work-up, there is no evidence of pneumonia, acute CHF, or other cardiac etiology.  Although the patient reports some shortness of breath over the last few weeks which could be partly related to the anemia, her acute shortness of breath today is unlikely due to anemia since the hemoglobin has improved.  Overall presentation is most consistent with COPD  exacerbation, especially given that the patient's symptoms improved with Solu-Medrol and broncho-dilators.  At this time, the patient is stable.  She has no indication for admission.  I offered to observe her for a few hours in the ED and give additional neb treatments if she felt that she needed them, however the patient states that she feels comfortable and wants to go home.  I will prescribe a course of prednisone, and given the patient's low-grade temperature and elevated white count I will add azithromycin.  The patient already has albuterol at home and I instructed her to use it every 4 hours.  Return precautions given, the patient expresses understanding.  Her daughter is here with her and also expressed agreement with the plan and understanding of the return precautions.  ____________________________________________   FINAL CLINICAL IMPRESSION(S) / ED DIAGNOSES  Final diagnoses:  COPD exacerbation (HCC)  SOB (shortness of breath)      NEW MEDICATIONS STARTED DURING THIS VISIT:  New Prescriptions   AZITHROMYCIN (ZITHROMAX Z-PAK) 250 MG TABLET    Take 2 tablets (500 mg) on  Day 1,  followed by 1 tablet (250 mg) once daily on Days 2 through 5.   PREDNISONE (DELTASONE) 20 MG TABLET    Take 3 tablets (60 mg total) by mouth daily for 5 days.     Note:  This document was prepared using Dragon voice recognition software and may include unintentional dictation errors.   Dionne Bucy, MD 04/07/20 0005

## 2020-04-07 ENCOUNTER — Other Ambulatory Visit: Payer: Self-pay

## 2020-04-07 ENCOUNTER — Other Ambulatory Visit (INDEPENDENT_AMBULATORY_CARE_PROVIDER_SITE_OTHER): Payer: 59

## 2020-04-07 DIAGNOSIS — D509 Iron deficiency anemia, unspecified: Secondary | ICD-10-CM | POA: Diagnosis not present

## 2020-04-07 LAB — CBC WITH DIFFERENTIAL/PLATELET
Basophils Absolute: 0 10*3/uL (ref 0.0–0.1)
Basophils Relative: 0.3 % (ref 0.0–3.0)
Eosinophils Absolute: 0 10*3/uL (ref 0.0–0.7)
Eosinophils Relative: 0 % (ref 0.0–5.0)
HCT: 29.3 % — ABNORMAL LOW (ref 36.0–46.0)
Hemoglobin: 9.2 g/dL — ABNORMAL LOW (ref 12.0–15.0)
Lymphocytes Relative: 8.1 % — ABNORMAL LOW (ref 12.0–46.0)
Lymphs Abs: 1 10*3/uL (ref 0.7–4.0)
MCHC: 31.4 g/dL (ref 30.0–36.0)
MCV: 81.5 fl (ref 78.0–100.0)
Monocytes Absolute: 0.6 10*3/uL (ref 0.1–1.0)
Monocytes Relative: 5 % (ref 3.0–12.0)
Neutro Abs: 10.3 10*3/uL — ABNORMAL HIGH (ref 1.4–7.7)
Neutrophils Relative %: 86.6 % — ABNORMAL HIGH (ref 43.0–77.0)
Platelets: 463 10*3/uL — ABNORMAL HIGH (ref 150.0–400.0)
RBC: 3.6 Mil/uL — ABNORMAL LOW (ref 3.87–5.11)
RDW: 17.5 % — ABNORMAL HIGH (ref 11.5–15.5)
WBC: 11.9 10*3/uL — ABNORMAL HIGH (ref 4.0–10.5)

## 2020-04-07 LAB — FERRITIN: Ferritin: 12.3 ng/mL (ref 10.0–291.0)

## 2020-04-07 MED ORDER — ALBUTEROL SULFATE (2.5 MG/3ML) 0.083% IN NEBU
2.5000 mg | INHALATION_SOLUTION | RESPIRATORY_TRACT | 1 refills | Status: DC | PRN
Start: 2020-04-07 — End: 2020-06-08

## 2020-04-07 MED ORDER — COMPRESSOR/NEBULIZER MISC
1.0000 [IU] | 0 refills | Status: DC
Start: 2020-04-07 — End: 2021-03-02

## 2020-04-08 ENCOUNTER — Other Ambulatory Visit: Payer: Self-pay | Admitting: Internal Medicine

## 2020-04-08 DIAGNOSIS — D509 Iron deficiency anemia, unspecified: Secondary | ICD-10-CM

## 2020-04-08 NOTE — Progress Notes (Signed)
Order placed for hematology referral.  

## 2020-04-13 ENCOUNTER — Other Ambulatory Visit: Payer: 59

## 2020-04-13 ENCOUNTER — Telehealth: Payer: Self-pay

## 2020-04-13 ENCOUNTER — Encounter
Admission: RE | Admit: 2020-04-13 | Discharge: 2020-04-13 | Disposition: A | Discharge status: Home / Self Care | Payer: 59 | Source: Ambulatory Visit | Attending: Family | Admitting: Family

## 2020-04-13 ENCOUNTER — Other Ambulatory Visit: Payer: Self-pay

## 2020-04-13 DIAGNOSIS — R0600 Dyspnea, unspecified: Secondary | ICD-10-CM

## 2020-04-13 DIAGNOSIS — R0609 Other forms of dyspnea: Secondary | ICD-10-CM

## 2020-04-13 DIAGNOSIS — I4891 Unspecified atrial fibrillation: Secondary | ICD-10-CM

## 2020-04-13 LAB — NM MYOCAR MULTI W/SPECT W/WALL MOTION / EF
Estimated workload: 1 METS
Exercise duration (min): 0 min
Exercise duration (sec): 0 s
LV dias vol: 77 mL (ref 46–106)
LV sys vol: 26 mL
MPHR: 136 {beats}/min
Peak HR: 112 {beats}/min
Percent HR: 82 %
Rest HR: 65 {beats}/min
SDS: 3
SRS: 1
SSS: 0
TID: 1.05

## 2020-04-13 MED ORDER — REGADENOSON 0.4 MG/5ML IV SOLN
0.4000 mg | Freq: Once | INTRAVENOUS | Status: AC
  Administered 2020-04-13: 0.4 mg via INTRAVENOUS

## 2020-04-13 MED ORDER — TECHNETIUM TC 99M TETROFOSMIN IV KIT
10.0000 | PACK | Freq: Once | INTRAVENOUS | Status: AC | PRN
  Administered 2020-04-13: 9.426 via INTRAVENOUS

## 2020-04-13 MED ORDER — TECHNETIUM TC 99M TETROFOSMIN IV KIT
30.4830 | PACK | Freq: Once | INTRAVENOUS | Status: AC | PRN
  Administered 2020-04-13: 30.483 via INTRAVENOUS

## 2020-04-13 NOTE — Telephone Encounter (Signed)
-----   Message from Alver Sorrow, NP sent at 04/13/2020  1:42 PM EDT ----- Stress test was no risk with no evidence of ischemia. Good result!  She did have possible PAF during stress test per my discussion with Leafy Kindle, PA. Can we please mail her a 14-day monitor for palpitations? Thank you!

## 2020-04-13 NOTE — Telephone Encounter (Signed)
Call attempted. Line busy. No vm available.

## 2020-04-14 NOTE — Telephone Encounter (Signed)
Call to patient to review stress test results.    Pt verbalized understanding and has no further questions at this time.    Advised pt to call for any further questions or concerns.  Order placed for long term monitor.

## 2020-04-14 NOTE — Addendum Note (Signed)
Addended by: Efrain Sella on: 04/14/2020 09:18 AM   Modules accepted: Orders

## 2020-04-16 ENCOUNTER — Ambulatory Visit (INDEPENDENT_AMBULATORY_CARE_PROVIDER_SITE_OTHER): Payer: 59

## 2020-04-16 ENCOUNTER — Encounter: Payer: Self-pay | Admitting: Internal Medicine

## 2020-04-16 ENCOUNTER — Other Ambulatory Visit: Payer: Self-pay

## 2020-04-16 ENCOUNTER — Inpatient Hospital Stay: Payer: 59

## 2020-04-16 ENCOUNTER — Inpatient Hospital Stay: Payer: 59 | Attending: Internal Medicine | Admitting: Internal Medicine

## 2020-04-16 DIAGNOSIS — D509 Iron deficiency anemia, unspecified: Secondary | ICD-10-CM | POA: Diagnosis present

## 2020-04-16 DIAGNOSIS — E611 Iron deficiency: Secondary | ICD-10-CM | POA: Insufficient documentation

## 2020-04-16 DIAGNOSIS — I4891 Unspecified atrial fibrillation: Secondary | ICD-10-CM | POA: Diagnosis not present

## 2020-04-16 LAB — URINALYSIS, COMPLETE (UACMP) WITH MICROSCOPIC
Bacteria, UA: NONE SEEN
Bilirubin Urine: NEGATIVE
Glucose, UA: NEGATIVE mg/dL
Hgb urine dipstick: NEGATIVE
Ketones, ur: NEGATIVE mg/dL
Nitrite: NEGATIVE
Protein, ur: NEGATIVE mg/dL
Specific Gravity, Urine: 1.026 (ref 1.005–1.030)
pH: 5 (ref 5.0–8.0)

## 2020-04-16 NOTE — Progress Notes (Signed)
[facility] CONSULT NOTE  Patient Care Team: Einar Pheasant, MD as PCP - General (Internal Medicine) Wellington Hampshire, MD as PCP - Cardiology (Cardiology)  CHIEF COMPLAINTS/PURPOSE OF CONSULTATION:    HEMATOLOGY HISTORY  #Anemia iron deficiency [May 2021-hemoglobin 8-9; ferritin 8; saturation 4%]; p.o. iron; colonoscopy-poor prep not done- 2015; last Colo-2010; EGD none   HISTORY OF PRESENTING ILLNESS:  Tanya Cole 86 y.o.  female has been referred to [place] for further evaluation/work-up for anemia.  Patient complains of worsening shortness of breath on exertion.  Complains of worsening fatigue.  Blood in stools: None Change in bowel habits- None Blood in urine: None Difficulty swallowing: None Abnormal weight loss: None Iron supplementation: PO iron x2 weeks.  Prior Blood transfusions: none Vaginal bleeding: None   Review of Systems  Constitutional: Positive for malaise/fatigue. Negative for chills, diaphoresis, fever and weight loss.  HENT: Negative for nosebleeds and sore throat.   Eyes: Negative for double vision.  Respiratory: Positive for shortness of breath. Negative for cough, hemoptysis, sputum production and wheezing.   Cardiovascular: Negative for chest pain, palpitations, orthopnea and leg swelling.  Gastrointestinal: Negative for abdominal pain, blood in stool, constipation, diarrhea, heartburn, melena, nausea and vomiting.  Genitourinary: Negative for dysuria, frequency and urgency.  Musculoskeletal: Negative for back pain and joint pain.  Skin: Negative.  Negative for itching and rash.  Neurological: Negative for dizziness, tingling, focal weakness, weakness and headaches.  Endo/Heme/Allergies: Does not bruise/bleed easily.  Psychiatric/Behavioral: Negative for depression. The patient is not nervous/anxious and does not have insomnia.     MEDICAL HISTORY:  Past Medical History:  Diagnosis Date  . Aortic insufficiency    a. TTE 12/19:  EF 55-60%, probable HK of the mid apical anterior septal myocardium, Gr1DD, mild AI, mildly dilated LA  . Asthma   . CAD (coronary artery disease)    a. NSTEMI 12/19; b. LHC 10/08/18: LM minimal luminal irregs, mLAD-1 95% s/p PCI/DES, mLAD-2 60%, LCx mild diffuse disease throughout, RCA minimal luminal irregs  . Diabetes mellitus ([place])   . Hypercholesterolemia   . Hypertension   . Myocardial infarction ([place])   . Osteopenia   . Polymyalgia rheumatica syndrome ([place])   . Reactive airway disease     SURGICAL HISTORY: Past Surgical History:  Procedure Laterality Date  . ABDOMINAL HYSTERECTOMY  1981   prolapse and bleeding, ovaries not removed  . BREAST EXCISIONAL BIOPSY Right   . CHOLECYSTECTOMY N/A 09/02/2019   Procedure: LAPAROSCOPIC CHOLECYSTECTOMY WITH INTRAOPERATIVE CHOLANGIOGRAM;  Surgeon: Olean Ree, MD;  Location: [facility];  Service: General;  Laterality: N/A;  . CORONARY STENT INTERVENTION N/A 10/08/2018   Procedure: CORONARY STENT INTERVENTION;  Surgeon: Wellington Hampshire, MD;  Location: [facility];  Service: Cardiovascular;  Laterality: N/A;  . ENDOSCOPIC RETROGRADE CHOLANGIOPANCREATOGRAPHY (ERCP) WITH PROPOFOL N/A 08/08/2019   Procedure: ENDOSCOPIC RETROGRADE CHOLANGIOPANCREATOGRAPHY (ERCP) WITH PROPOFOL;  Surgeon: Lucilla Lame, MD;  Location: [facility];  Service: Endoscopy;  Laterality: N/A;  . LEFT HEART CATH AND CORONARY ANGIOGRAPHY N/A 10/08/2018   Procedure: LEFT HEART CATH AND CORONARY ANGIOGRAPHY;  Surgeon: Wellington Hampshire, MD;  Location: [facility];  Service: Cardiovascular;  Laterality: N/A;  . UMBILICAL HERNIA REPAIR  7/94    SOCIAL HISTORY: Social History   Socioeconomic History  . Marital status: Widowed    Spouse name: Not on file  . Number of children: 3  . Years of education: Not on file  . Highest education level: Not on file  Occupational History  . Not on file  Tobacco Use  . Smoking status: Never Smoker  . Smokeless  tobacco: Never Used  Vaping Use  . Vaping Use: Never used  Substance and Sexual Activity  . Alcohol use: No    Alcohol/week: 0.0 standard drinks  . Drug use: No  . Sexual activity: Not Currently  Other Topics Concern  . 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    Social Determinants of Health   Financial Resource Strain:   . Difficulty of Paying Living Expenses:   Food Insecurity:   . Worried About Programme researcher, broadcasting/film/video in the Last Year:   . Barista in the Last Year:   Transportation Needs:   . Freight forwarder (Medical):   Marland Kitchen Lack of Transportation (Non-Medical):   Physical Activity:   . Days of Exercise per Week:   . Minutes of Exercise per Session:   Stress:   . Feeling of Stress :   Social Connections: Moderately Integrated  . Frequency of Communication with Friends and Family: More than three times a week  . Frequency of Social Gatherings with Friends and Family: More than three times a week  . Attends Religious Services: More than 4 times per year  . Active Member of Clubs or Organizations: Yes  . Attends Banker Meetings: Not on file  . Marital Status: Widowed  Intimate Partner Violence: Not At Risk  . Fear of Current or Ex-Partner: No  . Emotionally Abused: No  . Physically Abused: No  . Sexually Abused: No    FAMILY HISTORY: Family History  Problem Relation Age of Onset  . Heart attack Father   . Arthritis Mother   . Heart disease Mother   . Throat cancer Sister   . Parkinson's disease Sister   . COPD Brother     ALLERGIES:  is allergic to tramadol.  MEDICATIONS:  Current Outpatient Medications  Medication Sig Dispense Refill  . ADVAIR DISKUS 250-50 MCG/DOSE AEPB INHALE ONE PUFF BY MOUTH EVERY 12 HOURS. RINSE MOUTH AFTER EACH USE (Patient taking differently: Inhale 1 puff into the lungs 2 (two) times daily. ) 60 each 5  . albuterol (PROVENTIL) (2.5  MG/3ML) 0.083% nebulizer solution Take 3 mLs (2.5 mg total) by nebulization every 4 (four) hours as needed for wheezing or shortness of breath. 75 mL 1  . alendronate (FOSAMAX) 70 MG tablet Take 70 mg by mouth once a week.     Marland Kitchen aspirin 81 MG tablet Take 81 mg by mouth daily.    Marland Kitchen atorvastatin (LIPITOR) 80 MG tablet Take 1 tablet (80 mg total) by mouth daily at 6 PM. 90 tablet 3  . clopidogrel (PLAVIX) 75 MG tablet Take 1 tablet by mouth once daily with breakfast 90 tablet 0  . cyanocobalamin (,VITAMIN B-12,) 1000 MCG/ML injection Inject into the muscle every 30 (thirty) days.    . ferrous sulfate 325 (65 FE) MG tablet Take 325 mg by mouth daily with breakfast.    . fluticasone (FLONASE) 50 MCG/ACT nasal spray Use 2 spray(s) in each nostril once daily 16 g 0  . ibuprofen (ADVIL) 400 MG tablet Take 1 tablet (400 mg total) by mouth every 8 (eight) hours as needed for mild pain or moderate pain. 30 tablet 0  . lisinopril (ZESTRIL) 20 MG tablet Take 1 tablet (20 mg total) by mouth daily. 90 tablet 3  .  metFORMIN (GLUCOPHAGE) 500 MG tablet TAKE 1 TABLET BY MOUTH TWICE DAILY WITH A MEAL 180 tablet 0  . metoprolol tartrate (LOPRESSOR) 25 MG tablet Take 1/2 (one-half) tablet by mouth twice daily 90 tablet 0  . Multiple Vitamins-Minerals (VISION FORMULA/LUTEIN PO) Take by mouth daily.    . Nebulizers (COMPRESSOR/NEBULIZER) MISC 1 Units by Does not apply route as directed. 1 each 0  . nitroGLYCERIN (NITROSTAT) 0.4 MG SL tablet Place 1 tablet (0.4 mg total) under the tongue every 5 (five) minutes as needed for chest pain. 30 tablet 12  . PROAIR HFA 108 (90 Base) MCG/ACT inhaler Two puffs q 4-6 hours prn 18 g 2  . Vitamin D, Ergocalciferol, (DRISDOL) 50000 units CAPS capsule Take 50,000 Units by mouth once a week.  3   No current facility-administered medications for this visit.      PHYSICAL EXAMINATION:   Vitals:   04/16/20 1110  BP: 126/66  Pulse: (!) 59  Resp: 16  Temp: 98.9 F (37.2 C)   SpO2: 100%   Filed Weights   04/16/20 1110  Weight: 118 lb 6.4 oz (53.7 kg)    Physical Exam Constitutional:      Comments: Elderly Caucasian female patient.  Resting comfortably.  Accompanied with family.  HENT:     Head: Normocephalic and atraumatic.     Mouth/Throat:     Pharynx: No oropharyngeal exudate.  Eyes:     Pupils: Pupils are equal, round, and reactive to light.  Cardiovascular:     Rate and Rhythm: Normal rate and regular rhythm.  Pulmonary:     Effort: Pulmonary effort is normal. No respiratory distress.     Breath sounds: Normal breath sounds. No wheezing.  Abdominal:     General: Bowel sounds are normal. There is no distension.     Palpations: Abdomen is soft. There is no mass.     Tenderness: There is no abdominal tenderness. There is no guarding or rebound.  Musculoskeletal:        General: No tenderness. Normal range of motion.     Cervical back: Normal range of motion and neck supple.  Skin:    General: Skin is warm.  Neurological:     Mental Status: She is alert and oriented to person, place, and time.  Psychiatric:        Mood and Affect: Affect normal.     LABORATORY DATA:  I have reviewed the data as listed Lab Results  Component Value Date   WBC 11.9 (H) 04/07/2020   HGB 9.2 (L) 04/07/2020   HCT 29.3 (L) 04/07/2020   MCV 81.5 04/07/2020   PLT 463.0 (H) 04/07/2020   Recent Labs    08/04/19 2132 08/06/19 0518 08/09/19 0452 08/09/19 0452 08/15/19 1302 08/15/19 1302 09/17/19 1504 03/23/20 0912 04/06/20 1823  NA 142   < > 141   < > 140   < > 140 142 139  K 3.5   < > 4.3   < > 4.2   < > 4.7 4.2 4.5  CL 106   < > 112*   < > 102   < > 102 106 106  CO2 22   < > 22   < > 25   < > 26 26 23   GLUCOSE 118*   < > 118*   < > 134*   < > 97 164* 160*  BUN 18   < > <5*   < > 18   < > 22 12 13  CREATININE 0.53   < > 0.55   < > 0.74   < > 0.65 0.72 0.71  CALCIUM 8.5*   < > 9.0   < > 9.3   < > 10.2 9.0 9.0  GFRNONAA >60   < > >60  --  >60  --    --   --  >60  GFRAA >60   < > >60  --  >60  --   --   --  >60  PROT 7.0  6.7   < > 6.0*   < > 7.0  --  7.4 6.6  --   ALBUMIN 4.1  3.9   < > 3.2*   < > 3.8  --  4.5 4.2  --   AST 433*  424*   < > 120*   < > 21  --  13 11  --   ALT 294*  292*   < > 153*   < > 31  --  10 8  --   ALKPHOS 317*  312*   < > 336*   < > 180*  --  82 66  --   BILITOT 2.8*  2.6*   < > 1.3*   < > 1.0  --  0.5 0.4  --   BILIDIR 1.5*  1.6*  --   --   --   --   --  0.1 0.1  --   IBILI 1.3*  1.0*  --   --   --   --   --   --   --   --    < > = values in this interval not displayed.     DG Chest 2 View  Result Date: 04/06/2020 CLINICAL DATA:  Short of breath, chest tightness EXAM: CHEST - 2 VIEW COMPARISON:  08/04/2019 FINDINGS: Frontal and lateral views of the chest demonstrates an unremarkable cardiac silhouette. No acute airspace disease, effusion, or pneumothorax. Background emphysema with scattered areas of scarring again noted, greatest at the left lung base, stable. No acute bony abnormalities. IMPRESSION: 1. Stable exam, no acute process. Electronically Signed   By: Sharlet Salina M.D.   On: 04/06/2020 19:23   NM Myocar Multi W/Spect W/Wall Motion / EF  Result Date: 04/13/2020  T wave inversion was noted during stress in the II, III, aVF, V3, V4, V5 and V6 leads.  The study is normal.  This is a low risk study.  The left ventricular ejection fraction is normal (59%).  There is no evidence for ischemia     Iron deficiency #Iron deficiency anemia-hemoglobin 8-9; on p.o. iron.  Improving however continues to be symptomatic.  Recommend IV iron infusion.  Discussed the potential acute infusion reactions with IV iron; which are quite rare.  Patient understands the risk; will proceed with infusions.  # Etiology: Iron deficiency unclear; check stool occult UA if inconclusive would recommend GI evaluation.  Thank you Dr.Scott for allowing me to participate in the care of your pleasant patient. Please do not  hesitate to contact me with questions or concerns in the interim.  # DISPOSITION: # stool cards x3; UA today [please order] # Venofer IV weekly x3; start early-mid next week # follow up in 3 weeks; MD; labs- cbc/bmp/LDH;retic count; venofer; Dr.B   All questions were answered. The patient knows to call the clinic with any problems, questions or concerns.    Earna Coder, MD 04/23/2020 8:19 AM

## 2020-04-16 NOTE — Assessment & Plan Note (Addendum)
#  Iron deficiency anemia-hemoglobin 8-9; on p.o. iron.  Improving however continues to be symptomatic.  Recommend IV iron infusion.  Discussed the potential acute infusion reactions with IV iron; which are quite rare.  Patient understands the risk; will proceed with infusions.  # Etiology: Iron deficiency unclear; check stool occult UA if inconclusive would recommend GI evaluation.  Thank you Dr.Scott for allowing me to participate in the care of your pleasant patient. Please do not hesitate to contact me with questions or concerns in the interim.  # DISPOSITION: # stool cards x3; UA today [please order] # Venofer IV weekly x3; start early-mid next week # follow up in 3 weeks; MD; labs- cbc/bmp/LDH;retic count; venofer; Dr.B

## 2020-04-22 ENCOUNTER — Other Ambulatory Visit: Payer: Self-pay

## 2020-04-22 ENCOUNTER — Inpatient Hospital Stay: Payer: 59

## 2020-04-22 VITALS — BP 132/73 | HR 71 | Temp 99.2°F | Resp 19

## 2020-04-22 DIAGNOSIS — D509 Iron deficiency anemia, unspecified: Secondary | ICD-10-CM | POA: Diagnosis not present

## 2020-04-22 DIAGNOSIS — E611 Iron deficiency: Secondary | ICD-10-CM

## 2020-04-22 MED ORDER — SODIUM CHLORIDE 0.9 % IV SOLN: 200.0000 mg | Freq: Once | INTRAVENOUS | Status: DC

## 2020-04-22 MED ORDER — SODIUM CHLORIDE 0.9 % IV SOLN
Freq: Once | INTRAVENOUS | Status: AC
  Filled 2020-04-22: qty 250

## 2020-04-22 MED ORDER — IRON SUCROSE 20 MG/ML IV SOLN
200.0000 mg | Freq: Once | INTRAVENOUS | Status: AC
  Administered 2020-04-22: 200 mg via INTRAVENOUS
  Filled 2020-04-22: qty 10

## 2020-04-29 ENCOUNTER — Other Ambulatory Visit: Payer: Self-pay

## 2020-04-29 ENCOUNTER — Inpatient Hospital Stay: Payer: 59

## 2020-04-29 VITALS — BP 179/74 | HR 79 | Resp 18

## 2020-04-29 DIAGNOSIS — D509 Iron deficiency anemia, unspecified: Secondary | ICD-10-CM | POA: Diagnosis not present

## 2020-04-29 DIAGNOSIS — E611 Iron deficiency: Secondary | ICD-10-CM

## 2020-04-29 MED ORDER — SODIUM CHLORIDE 0.9 % IV SOLN
Freq: Once | INTRAVENOUS | Status: AC
  Filled 2020-04-29: qty 250

## 2020-04-29 MED ORDER — SODIUM CHLORIDE 0.9 % IV SOLN: 200.0000 mg | Freq: Once | INTRAVENOUS | Status: DC

## 2020-04-29 MED ORDER — IRON SUCROSE 20 MG/ML IV SOLN
200.0000 mg | Freq: Once | INTRAVENOUS | Status: AC
  Administered 2020-04-29: 200 mg via INTRAVENOUS
  Filled 2020-04-29: qty 10

## 2020-05-03 ENCOUNTER — Other Ambulatory Visit: Payer: Self-pay | Admitting: Internal Medicine

## 2020-05-08 ENCOUNTER — Other Ambulatory Visit: Payer: Self-pay | Admitting: *Deleted

## 2020-05-08 DIAGNOSIS — E611 Iron deficiency: Secondary | ICD-10-CM

## 2020-05-11 ENCOUNTER — Inpatient Hospital Stay: Payer: Medicare HMO

## 2020-05-11 ENCOUNTER — Other Ambulatory Visit: Payer: Self-pay

## 2020-05-11 ENCOUNTER — Encounter: Payer: Self-pay | Admitting: Internal Medicine

## 2020-05-11 ENCOUNTER — Inpatient Hospital Stay: Payer: Medicare HMO | Attending: Internal Medicine

## 2020-05-11 ENCOUNTER — Inpatient Hospital Stay (HOSPITAL_BASED_OUTPATIENT_CLINIC_OR_DEPARTMENT_OTHER): Payer: Medicare HMO | Admitting: Internal Medicine

## 2020-05-11 DIAGNOSIS — E611 Iron deficiency: Secondary | ICD-10-CM

## 2020-05-11 DIAGNOSIS — D509 Iron deficiency anemia, unspecified: Secondary | ICD-10-CM | POA: Diagnosis not present

## 2020-05-11 LAB — BASIC METABOLIC PANEL
Anion gap: 12 (ref 5–15)
BUN: 17 mg/dL (ref 8–23)
CO2: 25 mmol/L (ref 22–32)
Calcium: 9.8 mg/dL (ref 8.9–10.3)
Chloride: 103 mmol/L (ref 98–111)
Creatinine, Ser: 0.75 mg/dL (ref 0.44–1.00)
GFR calc Af Amer: >60 mL/min (ref >60)
GFR calc non Af Amer: >60 mL/min (ref >60)
Glucose, Bld: 128 mg/dL — ABNORMAL HIGH (ref 70–99)
Potassium: 4 mmol/L (ref 3.5–5.1)
Sodium: 140 mmol/L (ref 135–145)

## 2020-05-11 LAB — LACTATE DEHYDROGENASE: LDH: 102 U/L (ref 98–192)

## 2020-05-11 LAB — RETICULOCYTES
Immature Retic Fract: 8.9 % (ref 2.3–15.9)
RBC.: 4.65 MIL/uL (ref 3.87–5.11)
Retic Count, Absolute: 67 10*3/uL (ref 19.0–186.0)
Retic Ct Pct: 1.4 % (ref 0.4–3.1)

## 2020-05-11 LAB — CBC WITH DIFFERENTIAL/PLATELET
Abs Immature Granulocytes: 0.03 10*3/uL (ref 0.00–0.07)
Basophils Absolute: 0.1 10*3/uL (ref 0.0–0.1)
Basophils Relative: 1 %
Eosinophils Absolute: 0.7 10*3/uL — ABNORMAL HIGH (ref 0.0–0.5)
Eosinophils Relative: 7 %
HCT: 38 % (ref 36.0–46.0)
Hemoglobin: 12.1 g/dL (ref 12.0–15.0)
Immature Granulocytes: 0 %
Lymphocytes Relative: 18 %
Lymphs Abs: 2 10*3/uL (ref 0.7–4.0)
MCH: 25.9 pg — ABNORMAL LOW (ref 26.0–34.0)
MCHC: 31.8 g/dL (ref 30.0–36.0)
MCV: 81.2 fL (ref 80.0–100.0)
Monocytes Absolute: 0.8 10*3/uL (ref 0.1–1.0)
Monocytes Relative: 7 %
Neutro Abs: 7.1 10*3/uL (ref 1.7–7.7)
Neutrophils Relative %: 67 %
Platelets: 385 10*3/uL (ref 150–400)
RBC: 4.68 MIL/uL (ref 3.87–5.11)
RDW: 18 % — ABNORMAL HIGH (ref 11.5–15.5)
WBC: 10.6 10*3/uL — ABNORMAL HIGH (ref 4.0–10.5)
nRBC: 0 % (ref 0.0–0.2)

## 2020-05-11 MED ORDER — SODIUM CHLORIDE 0.9 % IV SOLN: 200.0000 mg | Freq: Once | INTRAVENOUS | Status: DC

## 2020-05-11 MED ORDER — IRON SUCROSE 20 MG/ML IV SOLN
200.0000 mg | Freq: Once | INTRAVENOUS | Status: AC
  Administered 2020-05-11: 200 mg via INTRAVENOUS
  Filled 2020-05-11: qty 10

## 2020-05-11 MED ORDER — SODIUM CHLORIDE 0.9 % IV SOLN
Freq: Once | INTRAVENOUS | Status: AC
  Filled 2020-05-11: qty 250

## 2020-05-11 NOTE — Progress Notes (Signed)
[facility] CONSULT NOTE  Patient Care Team: Einar Pheasant, MD as PCP - General (Internal Medicine) Wellington Hampshire, MD as PCP - Cardiology (Cardiology)  CHIEF COMPLAINTS/PURPOSE OF CONSULTATION:    HEMATOLOGY HISTORY  #Anemia iron deficiency [May 2021-hemoglobin 8-9; ferritin 8; saturation 4%]; p.o. iron; colonoscopy-poor prep not done- 2015; last Colo-2010; EGD none   HISTORY OF PRESENTING ILLNESS:  Tanya Cole 86 y.o.  female with iron deficiency is here for follow-up.  Patient s/p IV iron infusion noted to have improvement of iron levels/hemoglobin levels.  Mild improvement of her energy also.  Awaiting GI evaluation.  Complains of a mild rash on her chest.   Review of Systems  Constitutional: Positive for malaise/fatigue. Negative for chills, diaphoresis, fever and weight loss.  HENT: Negative for nosebleeds and sore throat.   Eyes: Negative for double vision.  Respiratory: Positive for shortness of breath. Negative for cough, hemoptysis, sputum production and wheezing.   Cardiovascular: Negative for chest pain, palpitations, orthopnea and leg swelling.  Gastrointestinal: Negative for abdominal pain, blood in stool, constipation, diarrhea, heartburn, melena, nausea and vomiting.  Genitourinary: Negative for dysuria, frequency and urgency.  Musculoskeletal: Negative for back pain and joint pain.  Skin: Negative.  Negative for itching and rash.  Neurological: Negative for dizziness, tingling, focal weakness, weakness and headaches.  Endo/Heme/Allergies: Does not bruise/bleed easily.  Psychiatric/Behavioral: Negative for depression. The patient is not nervous/anxious and does not have insomnia.     MEDICAL HISTORY:  Past Medical History:  Diagnosis Date   Aortic insufficiency    a. TTE 12/19: EF 55-60%, probable HK of the mid apical anterior septal myocardium, Gr1DD, mild AI, mildly dilated LA   Asthma    CAD (coronary artery disease)    a.  NSTEMI 12/19; b. LHC 10/08/18: LM minimal luminal irregs, mLAD-1 95% s/p PCI/DES, mLAD-2 60%, LCx mild diffuse disease throughout, RCA minimal luminal irregs   Diabetes mellitus ([facility])    Hypercholesterolemia    Hypertension    Myocardial infarction ([place])    Osteopenia    Polymyalgia rheumatica syndrome ([place])    Reactive airway disease     SURGICAL HISTORY: Past Surgical History:  Procedure Laterality Date   ABDOMINAL HYSTERECTOMY  1981   prolapse and bleeding, ovaries not removed   BREAST EXCISIONAL BIOPSY Right    CHOLECYSTECTOMY N/A 09/02/2019   Procedure: LAPAROSCOPIC CHOLECYSTECTOMY WITH INTRAOPERATIVE CHOLANGIOGRAM;  Surgeon: Olean Ree, MD;  Location: [facility];  Service: General;  Laterality: N/A;   CORONARY STENT INTERVENTION N/A 10/08/2018   Procedure: CORONARY STENT INTERVENTION;  Surgeon: Wellington Hampshire, MD;  Location: [facility];  Service: Cardiovascular;  Laterality: N/A;   ENDOSCOPIC RETROGRADE CHOLANGIOPANCREATOGRAPHY (ERCP) WITH PROPOFOL N/A 08/08/2019   Procedure: ENDOSCOPIC RETROGRADE CHOLANGIOPANCREATOGRAPHY (ERCP) WITH PROPOFOL;  Surgeon: Lucilla Lame, MD;  Location: [facility];  Service: Endoscopy;  Laterality: N/A;   LEFT HEART CATH AND CORONARY ANGIOGRAPHY N/A 10/08/2018   Procedure: LEFT HEART CATH AND CORONARY ANGIOGRAPHY;  Surgeon: Wellington Hampshire, MD;  Location: [facility];  Service: Cardiovascular;  Laterality: N/A;   UMBILICAL HERNIA REPAIR  7/94    SOCIAL HISTORY: Social History   Socioeconomic History   Marital status: Widowed    Spouse name: Not on file   Number of children: 3   Years of education: Not on file   Highest education level: Not on file  Occupational History   Not on file  Tobacco Use   Smoking status: Never Smoker  Smokeless tobacco: Never Used  Vaping Use   Vaping Use: Never used  Substance and Sexual Activity   Alcohol use: No    Alcohol/week: 0.0 standard drinks   Drug use:  No   Sexual activity: Not Currently  Other Topics Concern   Not on file  Social History Narrative   No smoking; no alcohol; in [place]; 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    Social Determinants of Health   Financial Resource Strain:    Difficulty of Paying Living Expenses:   Food Insecurity:    Worried About Charity fundraiser in the Last Year:    Arboriculturist in the Last Year:   Transportation Needs:    Film/video editor (Medical):    Lack of Transportation (Non-Medical):   Physical Activity:    Days of Exercise per Week:    Minutes of Exercise per Session:   Stress:    Feeling of Stress :   Social Connections: Moderately Integrated   Frequency of Communication with Friends and Family: More than three times a week   Frequency of Social Gatherings with Friends and Family: More than three times a week   Attends Religious Services: More than 4 times per year   Active Member of [employer] or Organizations: Yes   Attends Archivist Meetings: Not on file   Marital Status: Widowed  Human resources officer Violence: Not At Risk   Fear of Current or Ex-Partner: No   Emotionally Abused: No   Physically Abused: No   Sexually Abused: No    FAMILY HISTORY: Family History  Problem Relation Age of Onset   Heart attack Father    Arthritis Mother    Heart disease Mother    Throat cancer Sister    Parkinson's disease Sister    COPD Brother     ALLERGIES:  is allergic to tramadol.  MEDICATIONS:  Current Outpatient Medications  Medication Sig Dispense Refill   ADVAIR DISKUS 250-50 MCG/DOSE AEPB INHALE ONE PUFF BY MOUTH EVERY 12 HOURS. RINSE MOUTH AFTER EACH USE (Patient taking differently: Inhale 1 puff into the lungs 2 (two) times daily. ) 60 each 5   albuterol (PROVENTIL) (2.5 MG/3ML) 0.083% nebulizer solution Take 3 mLs (2.5 mg total) by nebulization every 4 (four) hours as needed for wheezing or shortness of breath. 75 mL 1   alendronate  (FOSAMAX) 70 MG tablet Take 70 mg by mouth once a week.      aspirin 81 MG tablet Take 81 mg by mouth daily.     atorvastatin (LIPITOR) 80 MG tablet Take 1 tablet (80 mg total) by mouth daily at 6 PM. 90 tablet 3   clopidogrel (PLAVIX) 75 MG tablet Take 1 tablet by mouth once daily with breakfast (Patient taking differently: Take 75 mg by mouth daily. ) 90 tablet 0   cyanocobalamin (,VITAMIN B-12,) 1000 MCG/ML injection Inject into the muscle every 30 (thirty) days.     ferrous sulfate 325 (65 FE) MG tablet Take 325 mg by mouth daily with breakfast.     fluticasone (FLONASE) 50 MCG/ACT nasal spray Use 2 spray(s) in each nostril once daily 16 g 0   ibuprofen (ADVIL) 400 MG tablet Take 1 tablet (400 mg total) by mouth every 8 (eight) hours as needed for mild pain or moderate pain. 30 tablet 0   lisinopril (ZESTRIL) 20 MG tablet Take 1 tablet (20 mg total) by mouth daily. 90 tablet 3   metFORMIN (GLUCOPHAGE) 500 MG tablet TAKE  1 TABLET BY MOUTH TWICE DAILY WITH A MEAL 180 tablet 0   metoprolol tartrate (LOPRESSOR) 25 MG tablet Take 1/2 (one-half) tablet by mouth twice daily 90 tablet 0   Multiple Vitamins-Minerals (VISION FORMULA/LUTEIN PO) Take by mouth daily.     Nebulizers (COMPRESSOR/NEBULIZER) MISC 1 Units by Does not apply route as directed. 1 each 0   nitroGLYCERIN (NITROSTAT) 0.4 MG SL tablet Place 1 tablet (0.4 mg total) under the tongue every 5 (five) minutes as needed for chest pain. 30 tablet 12   PROAIR HFA 108 (90 Base) MCG/ACT inhaler Two puffs q 4-6 hours prn 18 g 2   Vitamin D, Ergocalciferol, (DRISDOL) 50000 units CAPS capsule Take 50,000 Units by mouth once a week.  3   Sodium Sulfate-Mag Sulfate-KCl (SUTAB) [phone] MG TABS At 5 PM take 12 tablets using the 8 oz cup provided in the kit drinking 5 cups of water and 5 hours before your procedure repeat the same process. 24 tablet 0   No current facility-administered medications for this visit.      PHYSICAL  EXAMINATION:   Vitals:   05/11/20 1330  BP: (!) 155/65  Pulse: 70  Resp: 20  Temp: 99 F (37.2 C)  SpO2: 98%   Filed Weights   05/11/20 1330  Weight: 118 lb (53.5 kg)    Physical Exam Constitutional:      Comments: Elderly Caucasian female patient.  Resting comfortably.  Accompanied with family.  HENT:     Head: Normocephalic and atraumatic.     Mouth/Throat:     Pharynx: No oropharyngeal exudate.  Eyes:     Pupils: Pupils are equal, round, and reactive to light.  Cardiovascular:     Rate and Rhythm: Normal rate and regular rhythm.  Pulmonary:     Effort: Pulmonary effort is normal. No respiratory distress.     Breath sounds: Normal breath sounds. No wheezing.  Abdominal:     General: Bowel sounds are normal. There is no distension.     Palpations: Abdomen is soft. There is no mass.     Tenderness: There is no abdominal tenderness. There is no guarding or rebound.  Musculoskeletal:        General: No tenderness. Normal range of motion.     Cervical back: Normal range of motion and neck supple.  Skin:    General: Skin is warm.  Neurological:     Mental Status: She is alert and oriented to person, place, and time.  Psychiatric:        Mood and Affect: Affect normal.     LABORATORY DATA:  I have reviewed the data as listed Lab Results  Component Value Date   WBC 10.6 (H) 05/11/2020   HGB 12.1 05/11/2020   HCT 38.0 05/11/2020   MCV 81.2 05/11/2020   PLT 385 05/11/2020   Recent Labs    08/04/19 2132 08/06/19 0518 08/15/19 1302 08/15/19 1302 09/17/19 1504 09/17/19 1504 03/23/20 0912 04/06/20 1823 05/11/20 1248  NA 142   < > 140   < > 140   < > 142 139 140  K 3.5   < > 4.2   < > 4.7   < > 4.2 4.5 4.0  CL 106   < > 102   < > 102   < > 106 106 103  CO2 22   < > 25   < > 26   < > '26 23 25  ' GLUCOSE 118*   < > 134*   < >  97   < > 164* 160* 128*  BUN 18   < > 18   < > 22   < > '12 13 17  ' CREATININE 0.53   < > 0.74   < > 0.65   < > 0.72 0.71 0.75  CALCIUM  8.5*   < > 9.3   < > 10.2   < > 9.0 9.0 9.8  GFRNONAA >60   < > >60  --   --   --   --  >60 >60  GFRAA >60   < > >60  --   --   --   --  >60 >60  PROT 7.0   6.7   < > 7.0  --  7.4  --  6.6  --   --   ALBUMIN 4.1   3.9   < > 3.8  --  4.5  --  4.2  --   --   AST 433*   424*   < > 21  --  13  --  11  --   --   ALT 294*   292*   < > 31  --  10  --  8  --   --   ALKPHOS 317*   312*   < > 180*  --  82  --  66  --   --   BILITOT 2.8*   2.6*   < > 1.0  --  0.5  --  0.4  --   --   BILIDIR 1.5*   1.6*  --   --   --  0.1  --  0.1  --   --   IBILI 1.3*   1.0*  --   --   --   --   --   --   --   --    < > = values in this interval not displayed.     No results found.  Iron deficiency #Iron deficiency anemia-hemoglobin 8-9; on p.o. iron.  Improving however continues to be symptomatic.  Recommend IV iron infusion.  # Etiology: Iron deficiency unclear; awaiting GI evaluation next week.   # rash on bil neck/chest- ? Allergic recommend hydrocortsione  # DISPOSITION: # Venofer today # follow up in 2 months; MD; labs- cbc/bmp;Venofer; Dr.B   All questions were answered. The patient knows to call the clinic with any problems, questions or concerns.    Cammie Sickle, MD 05/24/2020 6:26 PM

## 2020-05-11 NOTE — Assessment & Plan Note (Addendum)
#  Iron deficiency anemia-hemoglobin 8-9; on p.o. iron.  Improving however continues to be symptomatic.  Recommend IV iron infusion.  # Etiology: Iron deficiency unclear; awaiting GI evaluation next week.   # rash on bil neck/chest- ? Allergic recommend hydrocortsione  # DISPOSITION: # Venofer today # follow up in 2 months; MD; labs- cbc/bmp;Venofer; Dr.B

## 2020-05-11 NOTE — Progress Notes (Signed)
Patient reports yesterday evening developing itching spots on back of neck, under bra and behind left knee. States she thinks it may be shingles and has not been vaccinated.

## 2020-05-18 DIAGNOSIS — I480 Paroxysmal atrial fibrillation: Secondary | ICD-10-CM | POA: Diagnosis not present

## 2020-05-19 ENCOUNTER — Encounter: Payer: Self-pay | Admitting: Gastroenterology

## 2020-05-19 ENCOUNTER — Ambulatory Visit (INDEPENDENT_AMBULATORY_CARE_PROVIDER_SITE_OTHER): Payer: Medicare HMO | Admitting: Gastroenterology

## 2020-05-19 ENCOUNTER — Other Ambulatory Visit: Payer: Self-pay

## 2020-05-19 VITALS — BP 164/75 | HR 79 | Temp 98.5°F | Ht 61.0 in | Wt 118.8 lb

## 2020-05-19 DIAGNOSIS — D509 Iron deficiency anemia, unspecified: Secondary | ICD-10-CM

## 2020-05-19 MED ORDER — SUTAB 1479-225-188 MG PO TABS
ORAL_TABLET | ORAL | 0 refills | Status: DC
Start: 2020-05-19 — End: 2020-08-10

## 2020-05-20 ENCOUNTER — Telehealth: Payer: Self-pay | Admitting: Cardiovascular Disease

## 2020-05-20 NOTE — Progress Notes (Signed)
See note.  GI sent a note stating pt scheduled for EGD and colonoscopy beginning of august.  They were questioning stopping her plavix.  She is seeing cardiology and is on plavix secondary to her heart issues.  Please notify GI that I recommend cardiology give recommendations for plavix.  Let me know if any problems. Also please notify pt that cardiology will need to give recommendations. Thanks

## 2020-05-20 NOTE — Telephone Encounter (Signed)
Tanya Cole is a 86 y.o. female who was last seen in cardiology clinic on 04/01/2020.  During that time she was having some indigestion type symptoms with worsening DOE.  She would like to have colonoscopy/EGD.  May we hold her Plavix for the procedure?  She has a PMH of CAD, HLD, DM2, HTN, reactive airway disease last seen 11/26/19 by Dr. Kirke Corin.  CAD s/p PCI and DES to midLAD in December 2019 after presenting with NSTEMI. Hospitalized 12/2018 with atypical chest pain, negative troponing. Lexiscan myoview 12/2018 with noe vidence of ischemia and normal LVEF.  Seen by PCP fo rDOE and asked to be seen by cardiology. Noted Hb of 8.4 with new onset anemia. She has been referred to GI and has upcoming appointment to establish care next month. No melena, hematuria noted.   Worsening DOE. No SOB at rest. No orthopnea, PND, LE edema. Endorses following low sodium, heart healthy diet.   Anginal equivalent of indigestion back in 2019. Her daughter tells me at the time she kept saying "something isn't right". When asked if she has had similar symptoms to her anginal equivalent she describes them as "not as bad".    Please direct your response to CV DIV preop pool.  Thank you for your help.  Thomasene Ripple. Tiffane Sheldon NP-C    05/20/2020, 3:10 PM [facility] [address] Office [phone] Fax [phone]

## 2020-05-20 NOTE — Progress Notes (Signed)
Tanya Cole, [place] [address]  Main: [phone]  Fax: [phone]   Gastroenterology Consultation  Referring Provider:     Einar Pheasant, MD Primary Care Physician:  Einar Pheasant, MD Reason for Consultation:   Iron deficiency anemia        HPI:    Chief Complaint  Patient presents with  . New Patient (Initial Visit)  . Iron deficiency anemia    Tanya Cole is a 86 y.o. y/o female referred for consultation & management  by Dr. Einar Pheasant, MD.  Patient found to have recent iron deficiency on recent blood work by PCP. The patient denies abdominal or flank pain, anorexia, nausea or vomiting, dysphagia, change in bowel habits or black or bloody stools or weight loss.  Denies any sources of bleeding.  Patient has never had a full colonoscopy.  Colonoscopies were attempted by Dr. Gustavo Lah in 2010 in 2015.  2015 was a poor prep and was aborted.  2010 colonoscopy was incomplete as well.  Past Medical History:  Diagnosis Date  . Aortic insufficiency    a. TTE 12/19: EF 55-60%, probable HK of the mid apical anterior septal myocardium, Gr1DD, mild AI, mildly dilated LA  . Asthma   . CAD (coronary artery disease)    a. NSTEMI 12/19; b. LHC 10/08/18: LM minimal luminal irregs, mLAD-1 95% s/p PCI/DES, mLAD-2 60%, LCx mild diffuse disease throughout, RCA minimal luminal irregs  . Diabetes mellitus ([place])   . Hypercholesterolemia   . Hypertension   . Myocardial infarction ([place])   . Osteopenia   . Polymyalgia rheumatica syndrome ([place])   . Reactive airway disease     Past Surgical History:  Procedure Laterality Date  . ABDOMINAL HYSTERECTOMY  1981   prolapse and bleeding, ovaries not removed  . BREAST EXCISIONAL BIOPSY Right   . CHOLECYSTECTOMY N/A 09/02/2019   Procedure: LAPAROSCOPIC CHOLECYSTECTOMY WITH INTRAOPERATIVE CHOLANGIOGRAM;  Surgeon: Olean Ree, MD;  Location: [facility] ORS;  Service: General;  Laterality: N/A;  .  CORONARY STENT INTERVENTION N/A 10/08/2018   Procedure: CORONARY STENT INTERVENTION;  Surgeon: Wellington Hampshire, MD;  Location: [place] [facility];  Service: Cardiovascular;  Laterality: N/A;  . ENDOSCOPIC RETROGRADE CHOLANGIOPANCREATOGRAPHY (ERCP) WITH PROPOFOL N/A 08/08/2019   Procedure: ENDOSCOPIC RETROGRADE CHOLANGIOPANCREATOGRAPHY (ERCP) WITH PROPOFOL;  Surgeon: Lucilla Lame, MD;  Location: [facility];  Service: Endoscopy;  Laterality: N/A;  . LEFT HEART CATH AND CORONARY ANGIOGRAPHY N/A 10/08/2018   Procedure: LEFT HEART CATH AND CORONARY ANGIOGRAPHY;  Surgeon: Wellington Hampshire, MD;  Location: [place] [facility];  Service: Cardiovascular;  Laterality: N/A;  . UMBILICAL HERNIA REPAIR  7/94    Prior to Admission medications   Medication Sig Start Date End Date Taking? Authorizing Provider  ADVAIR DISKUS 250-50 MCG/DOSE AEPB INHALE ONE PUFF BY MOUTH EVERY 12 HOURS. RINSE MOUTH AFTER EACH USE Patient taking differently: Inhale 1 puff into the lungs 2 (two) times daily.  02/17/16  Yes Einar Pheasant, MD  albuterol (PROVENTIL) (2.5 MG/3ML) 0.083% nebulizer solution Take 3 mLs (2.5 mg total) by nebulization every 4 (four) hours as needed for wheezing or shortness of breath. 04/07/20  Yes Arta Silence, MD  alendronate (FOSAMAX) 70 MG tablet Take 70 mg by mouth once a week.  11/06/17  Yes [provider]  aspirin 81 MG tablet Take 81 mg by mouth daily.   Yes [provider]  atorvastatin (LIPITOR) 80 MG tablet Take 1 tablet (80 mg total) by  mouth daily at 6 PM. 04/01/20  Yes Loel Dubonnet, NP  clopidogrel (PLAVIX) 75 MG tablet Take 1 tablet by mouth once daily with breakfast Patient taking differently: Take 75 mg by mouth daily.  02/27/20  Yes Dunn, Areta Haber, PA-C  cyanocobalamin (,VITAMIN B-12,) 1000 MCG/ML injection Inject into the muscle every 30 (thirty) days.   Yes [provider]  ferrous sulfate 325 (65 FE) MG tablet Take 325 mg by mouth daily with  breakfast.   Yes [provider]  fluticasone (FLONASE) 50 MCG/ACT nasal spray Use 2 spray(s) in each nostril once daily 04/08/20  Yes Einar Pheasant, MD  ibuprofen (ADVIL) 400 MG tablet Take 1 tablet (400 mg total) by mouth every 8 (eight) hours as needed for mild pain or moderate pain. 09/02/19  Yes Piscoya, Jacqulyn Bath, MD  lisinopril (ZESTRIL) 20 MG tablet Take 1 tablet (20 mg total) by mouth daily. 04/15/19  Yes Dunn, Ryan M, PA-C  metFORMIN (GLUCOPHAGE) 500 MG tablet TAKE 1 TABLET BY MOUTH TWICE DAILY WITH A MEAL 05/05/20  Yes Einar Pheasant, MD  metoprolol tartrate (LOPRESSOR) 25 MG tablet Take 1/2 (one-half) tablet by mouth twice daily 02/27/20  Yes Dunn, Areta Haber, PA-C  Multiple Vitamins-Minerals (VISION FORMULA/LUTEIN PO) Take by mouth daily.   Yes [provider]  Nebulizers (COMPRESSOR/NEBULIZER) MISC 1 Units by Does not apply route as directed. 04/07/20  Yes Arta Silence, MD  nitroGLYCERIN (NITROSTAT) 0.4 MG SL tablet Place 1 tablet (0.4 mg total) under the tongue every 5 (five) minutes as needed for chest pain. 10/09/18  Yes Bettey Costa, MD  PROAIR HFA 108 [phone] Base) MCG/ACT inhaler Two puffs q 4-6 hours prn 12/10/19  Yes Einar Pheasant, MD  Vitamin D, Ergocalciferol, (DRISDOL) 50000 units CAPS capsule Take 50,000 Units by mouth once a week. 06/24/18  Yes [provider]  Sodium Sulfate-Mag Sulfate-KCl (SUTAB) [phone] MG TABS At 5 PM take 12 tablets using the 8 oz cup provided in the kit drinking 5 cups of water and 5 hours before your procedure repeat the same process. 05/19/20   Virgel Manifold, MD    Family History  Problem Relation Age of Onset  . Heart attack Father   . Arthritis Mother   . Heart disease Mother   . Throat cancer Sister   . Parkinson's disease Sister   . COPD Brother      Social History   Tobacco Use  . Smoking status: Never Smoker  . Smokeless tobacco: Never Used  Vaping Use  . Vaping Use: Never used  Substance Use Topics  .  Alcohol use: No    Alcohol/week: 0.0 standard drinks  . Drug use: No    Allergies as of 05/19/2020 - Review Complete 05/19/2020  Allergen Reaction Noted  . Tramadol Itching and Nausea And Vomiting 04/29/2016    Review of Systems:    All systems reviewed and negative except where noted in HPI.   Physical Exam:  BP (!) 164/75   Pulse 79   Temp 98.5 F (36.9 C) (Oral)   Ht '5\' 1"'$  (1.549 m)   Wt 118 lb 12.8 oz (53.9 kg)   BMI 22.45 kg/m  No LMP recorded. Patient has had a hysterectomy. Psych:  Alert and cooperative. Normal mood and affect. General:   Alert,  Well-developed, well-nourished, pleasant and cooperative in NAD Head:  Normocephalic and atraumatic. Eyes:  Sclera clear, no icterus.   Conjunctiva pink. Ears:  Normal auditory acuity. Nose:  No deformity, discharge, or lesions.  Mouth:  No deformity or lesions,oropharynx pink & moist. Neck:  Supple; no masses or thyromegaly. Abdomen:  Normal bowel sounds.  No bruits.  Soft, non-tender and non-distended without masses, hepatosplenomegaly or hernias noted.  No guarding or rebound tenderness.    Msk:  Symmetrical without gross deformities. Good, equal movement & strength bilaterally. Pulses:  Normal pulses noted. Extremities:  No clubbing or edema.  No cyanosis. Neurologic:  Alert and oriented x3;  grossly normal neurologically. Skin:  Intact without significant lesions or rashes. No jaundice. Lymph Nodes:  No significant cervical adenopathy. Psych:  Alert and cooperative. Normal mood and affect.   Labs: CBC    Component Value Date/Time   WBC 10.6 (H) 05/11/2020 1248   RBC 4.68 05/11/2020 1248   RBC 4.65 05/11/2020 1247   HGB 12.1 05/11/2020 1248   HGB 11.8 (L) 02/18/2013 2004   HCT 38.0 05/11/2020 1248   HCT 35.7 02/18/2013 2004   PLT 385 05/11/2020 1248   PLT 242 02/18/2013 2004   MCV 81.2 05/11/2020 1248   MCV 88 02/18/2013 2004   MCH 25.9 (L) 05/11/2020 1248   MCHC 31.8 05/11/2020 1248   RDW 18.0 (H)  05/11/2020 1248   RDW 13.3 02/18/2013 2004   LYMPHSABS 2.0 05/11/2020 1248   MONOABS 0.8 05/11/2020 1248   EOSABS 0.7 (H) 05/11/2020 1248   BASOSABS 0.1 05/11/2020 1248   CMP     Component Value Date/Time   NA 140 05/11/2020 1248   NA 137 10/18/2018 1022   NA 135 (L) 02/18/2013 2004   K 4.0 05/11/2020 1248   K 4.1 02/18/2013 2004   CL 103 05/11/2020 1248   CL 102 02/18/2013 2004   CO2 25 05/11/2020 1248   CO2 27 02/18/2013 2004   GLUCOSE 128 (H) 05/11/2020 1248   GLUCOSE 186 (H) 02/18/2013 2004   BUN 17 05/11/2020 1248   BUN 14 10/18/2018 1022   BUN 26 (H) 02/18/2013 2004   CREATININE 0.75 05/11/2020 1248   CREATININE 0.69 04/29/2016 1613   CALCIUM 9.8 05/11/2020 1248   CALCIUM 9.5 02/18/2013 2004   PROT 6.6 03/23/2020 0912   PROT 7.5 02/18/2013 2004   ALBUMIN 4.2 03/23/2020 0912   ALBUMIN 3.6 02/18/2013 2004   AST 11 03/23/2020 0912   AST 13 (L) 02/18/2013 2004   ALT 8 03/23/2020 0912   ALT 15 02/18/2013 2004   ALKPHOS 66 03/23/2020 0912   ALKPHOS 71 02/18/2013 2004   BILITOT 0.4 03/23/2020 0912   BILITOT 0.5 02/18/2013 2004   GFRNONAA >60 05/11/2020 1248   GFRNONAA 52 (L) 02/18/2013 2004   GFRAA >60 05/11/2020 1248   GFRAA >60 02/18/2013 2004    Imaging Studies: No results found.  Assessment and Plan:   Tanya Cole is a 86 y.o. y/o female has been referred for here for iron deficiency anemia with no prior full colonoscopy given poor prep in 2015 and aborted procedure, and 2010 colonoscopy with extent of exam being hepatic flexure  EGD and colonoscopy indicated to rule out malignancy due to iron deficiency anemia  Alternative options of conservative management were discussed in detail, including but not limited to medication management, foregoing endoscopic procedures at this time and others.    I have discussed alternative options, risks & benefits,  which include, but are not limited to, bleeding, infection, perforation,respiratory complication &  drug reaction.  The patient agrees with this plan & written consent will be obtained.       Dr Tanya Antigua  Speech recognition software was used to dictate the above note.

## 2020-05-20 NOTE — Telephone Encounter (Signed)
° °  [facility] Pre-operative Risk Assessment    HEARTCARE STAFF: - Please ensure there is not already an duplicate clearance open for this procedure. - Under Visit Info/Reason for Call, type in Other and utilize the format Clearance MM/DD/YY or Clearance TBD. Do not use dashes or single digits. - If request is for dental extraction, please clarify the # of teeth to be extracted.  Request for surgical clearance:  1. What type of surgery is being performed? EGD and Colonoscopy   2. When is this surgery scheduled? 06/02/20   3. What type of clearance is required (medical clearance vs. Pharmacy clearance to hold med vs. Both)? both  4. Are there any medications that need to be held prior to surgery and how long? Plavix instructions   5. Practice name and name of physician performing surgery? [facility] - Dr Bonna Gains   6. What is the office phone number? [phone]   7.   What is the office fax number? [place]   8.   Anesthesia type (None, local, MAC, general) ? Not listed    Ace Gins 05/20/2020, 2:29 PM  _________________________________________________________________   (provider comments below)

## 2020-05-24 NOTE — Telephone Encounter (Signed)
Hold Plavix 5 days before.  °

## 2020-05-26 ENCOUNTER — Other Ambulatory Visit: Payer: Self-pay

## 2020-05-26 ENCOUNTER — Ambulatory Visit (INDEPENDENT_AMBULATORY_CARE_PROVIDER_SITE_OTHER): Payer: Medicare HMO

## 2020-05-26 DIAGNOSIS — E538 Deficiency of other specified B group vitamins: Secondary | ICD-10-CM

## 2020-05-26 MED ORDER — CYANOCOBALAMIN 1000 MCG/ML IJ SOLN
1000.0000 ug | Freq: Once | INTRAMUSCULAR | Status: AC
  Administered 2020-05-26: 1000 ug via INTRAMUSCULAR

## 2020-05-26 NOTE — Telephone Encounter (Signed)
   Primary Cardiologist: Lorine Bears, MD  Chart reviewed as part of pre-operative protocol coverage. Given past medical history and time since last visit, based on ACC/AHA guidelines, [place] would be at acceptable risk for the planned procedure without further cardiovascular testing.   Ok to hold Plavix 5 days pre op- resume as soon as possible post op.  I will route this recommendation to the requesting party via Epic fax function and remove from pre-op pool.  Please call with questions.  Corine Shelter, PA-C 05/26/2020, 9:03 AM

## 2020-05-26 NOTE — Progress Notes (Addendum)
Patient presented for B 12 injection to right deltoid, patient voiced no concerns nor showed any signs of distress during injection.  Reviewed.  Dr Scott 

## 2020-05-27 ENCOUNTER — Telehealth: Payer: Self-pay | Admitting: Internal Medicine

## 2020-05-27 NOTE — Telephone Encounter (Signed)
Patient stated she has been SOB starting this morning. She has an appointment with another provider tomorrow. She is wondering if dr Lorin Picket can send something in. No other SXs. Patient states usually Dr Lorin Picket sends over prednisone so she can be treated. [facility] on [address]refused appointment today.

## 2020-05-27 NOTE — Telephone Encounter (Signed)
Per note she is not having any other symptoms (no cough, congestion).  She has a history of heart disease.  If she is sob, really needs to be evaluated to confirm etiology of her symptoms - to know how to properly treat.  I am not in the office this pm.  I can try to work her in this week, but if she is acutely sob, needs to go ahead and be seen.

## 2020-05-27 NOTE — Telephone Encounter (Signed)
Called pt. Unable to reach. Has appt with Dr Kirke Corin tomorrow.

## 2020-05-27 NOTE — Telephone Encounter (Signed)
Pt called and said that her COPD is acting up and wanted to speak to the nurse

## 2020-05-28 ENCOUNTER — Telehealth: Payer: Self-pay

## 2020-05-28 ENCOUNTER — Encounter: Payer: Self-pay | Admitting: Cardiovascular Disease

## 2020-05-28 ENCOUNTER — Other Ambulatory Visit: Payer: Self-pay

## 2020-05-28 ENCOUNTER — Ambulatory Visit (INDEPENDENT_AMBULATORY_CARE_PROVIDER_SITE_OTHER): Payer: Medicare HMO | Admitting: Cardiovascular Disease

## 2020-05-28 VITALS — BP 120/60 | HR 76 | Ht 61.0 in | Wt 117.5 lb

## 2020-05-28 DIAGNOSIS — E7800 Pure hypercholesterolemia, unspecified: Secondary | ICD-10-CM | POA: Diagnosis not present

## 2020-05-28 DIAGNOSIS — I10 Essential (primary) hypertension: Secondary | ICD-10-CM

## 2020-05-28 DIAGNOSIS — I2510 Atherosclerotic heart disease of native coronary artery without angina pectoris: Secondary | ICD-10-CM

## 2020-05-28 NOTE — Telephone Encounter (Signed)
Called patient to let her know that Dr. Kirke Corin (cardiologist) gave her clearance to have her procedure done and to stop her Plavix and Aspirin 5 days before her procedure and resume right after her procedure. Therefore, starting today. Patient agreed and stated that she will put them aside for now. Patient had no further questions.

## 2020-05-28 NOTE — Progress Notes (Signed)
Cardiology Office Note   Date:  05/28/2020   ID:  [place] [birth year], MRN [id number]  PCP:  Einar Pheasant, MD  Cardiologist:   Kathlyn Sacramento, MD   Chief Complaint  Patient presents with   other    Follow up from [facility] and cardiac clearance for an endoscopy and colonoscopy that is scheduled for next Tues., Aug. 3, 2021. Meds reviewed by the pt. verbally. Pt. c/o shortness of breath.       History of Present Illness: Tanya Cole is a 86 y.o. female who presents for a follow-up visit regarding coronary artery disease. She has known history of coronary artery disease, diabetes, hypertension and hyperlipidemia.  She had non-ST elevation myocardial infarction in December 2019.  Cardiac catheterization showed 95% mid LAD stenosis and 60% distal LAD stenosis.  The mid LAD stenosis was treated successfully with PCI and drug-eluting stent placement.  She was hospitalized in February 2020 with atypical chest pain and negative troponin.  Lexiscan Myoview in March showed no evidence of ischemia with normal ejection fraction.    She had increased fatigue in May and her labs showed new anemia with hemoglobin of 8.4.  She started iron infusion and she is scheduled for an EGD.  She denies any chest pain.  She does complain of increased shortness of breath and fatigue.    Past Medical History:  Diagnosis Date   Aortic insufficiency    a. TTE 12/19: EF 55-60%, probable HK of the mid apical anterior septal myocardium, Gr1DD, mild AI, mildly dilated LA   Asthma    CAD (coronary artery disease)    a. NSTEMI 12/19; b. LHC 10/08/18: LM minimal luminal irregs, mLAD-1 95% s/p PCI/DES, mLAD-2 60%, LCx mild diffuse disease throughout, RCA minimal luminal irregs   Diabetes mellitus ([facility])    Hypercholesterolemia    Hypertension    Myocardial infarction ([employer])    Osteopenia    Polymyalgia rheumatica syndrome ([place])    Reactive airway disease     Past Surgical History:   Procedure Laterality Date   ABDOMINAL HYSTERECTOMY  1981   prolapse and bleeding, ovaries not removed   BREAST EXCISIONAL BIOPSY Right    CHOLECYSTECTOMY N/A 09/02/2019   Procedure: LAPAROSCOPIC CHOLECYSTECTOMY WITH INTRAOPERATIVE CHOLANGIOGRAM;  Surgeon: Olean Ree, MD;  Location: [facility];  Service: General;  Laterality: N/A;   CORONARY STENT INTERVENTION N/A 10/08/2018   Procedure: CORONARY STENT INTERVENTION;  Surgeon: Wellington Hampshire, MD;  Location: [place] [facility];  Service: Cardiovascular;  Laterality: N/A;   ENDOSCOPIC RETROGRADE CHOLANGIOPANCREATOGRAPHY (ERCP) WITH PROPOFOL N/A 08/08/2019   Procedure: ENDOSCOPIC RETROGRADE CHOLANGIOPANCREATOGRAPHY (ERCP) WITH PROPOFOL;  Surgeon: Lucilla Lame, MD;  Location: [facility];  Service: Endoscopy;  Laterality: N/A;   LEFT HEART CATH AND CORONARY ANGIOGRAPHY N/A 10/08/2018   Procedure: LEFT HEART CATH AND CORONARY ANGIOGRAPHY;  Surgeon: Wellington Hampshire, MD;  Location: [place] [facility];  Service: Cardiovascular;  Laterality: N/A;   UMBILICAL HERNIA REPAIR  7/94     Current Outpatient Medications  Medication Sig Dispense Refill   ADVAIR DISKUS 250-50 MCG/DOSE AEPB INHALE ONE PUFF BY MOUTH EVERY 12 HOURS. RINSE MOUTH AFTER EACH USE (Patient taking differently: Inhale 1 puff into the lungs 2 (two) times daily. ) 60 each 5   albuterol (PROVENTIL) (2.5 MG/3ML) 0.083% nebulizer solution Take 3 mLs (2.5 mg total) by nebulization every 4 (four) hours as needed for wheezing or shortness of breath. 75 mL 1   alendronate (FOSAMAX) 70 MG  tablet Take 70 mg by mouth once a week.      aspirin 81 MG tablet Take 81 mg by mouth daily.     atorvastatin (LIPITOR) 80 MG tablet Take 1 tablet (80 mg total) by mouth daily at 6 PM. 90 tablet 3   cyanocobalamin (,VITAMIN B-12,) 1000 MCG/ML injection Inject into the muscle every 30 (thirty) days.     ferrous sulfate 325 (65 FE) MG tablet Take 325 mg by mouth daily with breakfast.      fluticasone (FLONASE) 50 MCG/ACT nasal spray Use 2 spray(s) in each nostril once daily 16 g 0   ibuprofen (ADVIL) 400 MG tablet Take 1 tablet (400 mg total) by mouth every 8 (eight) hours as needed for mild pain or moderate pain. 30 tablet 0   lisinopril (ZESTRIL) 20 MG tablet Take 1 tablet (20 mg total) by mouth daily. 90 tablet 3   metFORMIN (GLUCOPHAGE) 500 MG tablet TAKE 1 TABLET BY MOUTH TWICE DAILY WITH A MEAL 180 tablet 0   metoprolol tartrate (LOPRESSOR) 25 MG tablet Take 1/2 (one-half) tablet by mouth twice daily 90 tablet 0   Multiple Vitamins-Minerals (VISION FORMULA/LUTEIN PO) Take by mouth daily.     Nebulizers (COMPRESSOR/NEBULIZER) MISC 1 Units by Does not apply route as directed. 1 each 0   nitroGLYCERIN (NITROSTAT) 0.4 MG SL tablet Place 1 tablet (0.4 mg total) under the tongue every 5 (five) minutes as needed for chest pain. 30 tablet 12   PROAIR HFA 108 (90 Base) MCG/ACT inhaler Two puffs q 4-6 hours prn 18 g 2   Sodium Sulfate-Mag Sulfate-KCl (SUTAB) [id number] MG TABS At 5 PM take 12 tablets using the 8 oz cup provided in the kit drinking 5 cups of water and 5 hours before your procedure repeat the same process. 24 tablet 0   Vitamin D, Ergocalciferol, (DRISDOL) 50000 units CAPS capsule Take 50,000 Units by mouth once a week.  3   No current facility-administered medications for this visit.    Allergies:   Tramadol    Social History:  The patient  reports that she has never smoked. She has never used smokeless tobacco. She reports that she does not drink alcohol and does not use drugs.   Family History:  The patient's family history includes Arthritis in her mother; COPD in her brother; Heart attack in her father; Heart disease in her mother; Parkinson's disease in her sister; Throat cancer in her sister.    ROS:  Please see the history of present illness.   Otherwise, review of systems are positive for none.   All other systems are reviewed and negative.     PHYSICAL EXAM: VS:  BP (!) 120/60 (BP Location: Left Arm, Patient Position: Sitting, Cuff Size: Normal)    Pulse 76    Ht '5\' 1"'  (1.549 m)    Wt 117 lb 8 oz (53.3 kg)    SpO2 98%    BMI 22.20 kg/m  , BMI Body mass index is 22.2 kg/m. GEN: Well nourished, well developed, in no acute distress  HEENT: normal  Neck: no JVD, carotid bruits, or masses Cardiac: RRR; no murmurs, rubs, or gallops,no edema  Respiratory:  clear to auscultation bilaterally, normal work of breathing GI: soft, nontender, nondistended, + BS MS: no deformity or atrophy  Skin: warm and dry, no rash Neuro:  Strength and sensation are intact Psych: euthymic mood, full affect   EKG:  EKG is ordered today. The ekg ordered today demonstrates normal sinus rhythm  with no significant ST or T wave changes.   Recent Labs: 09/17/2019: TSH 3.59 03/23/2020: ALT 8 05/11/2020: BUN 17; Creatinine, Ser 0.75; Hemoglobin 12.1; Platelets 385; Potassium 4.0; Sodium 140    Lipid Panel    Component Value Date/Time   CHOL 169 03/23/2020 0912   TRIG 155.0 (H) 03/23/2020 0912   HDL 48.90 03/23/2020 0912   CHOLHDL 3 03/23/2020 0912   VLDL 31.0 03/23/2020 0912   LDLCALC 90 03/23/2020 0912   LDLDIRECT 90.0 09/17/2019 1504      Wt Readings from Last 3 Encounters:  05/28/20 117 lb 8 oz (53.3 kg)  05/19/20 118 lb 12.8 oz (53.9 kg)  05/11/20 118 lb (53.5 kg)     No flowsheet data found.    ASSESSMENT AND PLAN:  1.  Coronary artery disease involving native coronary arteries without angina: She is doing very well with no anginal symptoms.  Continue medical therapy.  Given recent issues with anemia and the fact that her myocardial infarction and stent placement was more than a year ago, I elected to discontinue clopidogrel.  Continue aspirin 81 mg daily.  2.  Essential hypertension: Blood pressure is well controlled on metoprolol and lisinopril.  3.  Hyperlipidemia: Continue high-dose atorvastatin.  Most recent lipid profile  showed an LDL of 90.  Considering her age, I think this is reasonable.  4.  Anemia: Getting GI work-up.  Plavix discontinued as outlined above.   Disposition:   FU with me in 6 months  Signed,  Kathlyn Sacramento, MD  05/28/2020 4:51 PM    [facility]

## 2020-05-28 NOTE — Patient Instructions (Signed)
Medication Instructions:  Your physician has recommended you make the following change in your medication:   STOP Plavix  Continue your other medications as prescribed.  *If you need a refill on your cardiac medications before your next appointment, please call your pharmacy*   Lab Work: None ordered If you have labs (blood work) drawn today and your tests are completely normal, you will receive your results only by: . MyChart Message (if you have MyChart) OR . A paper copy in the mail If you have any lab test that is abnormal or we need to change your treatment, we will call you to review the results.   Testing/Procedures: None ordered   Follow-Up: At [facility], you and your health needs are our priority.  As part of our continuing mission to provide you with exceptional heart care, we have created designated Provider Care Teams.  These Care Teams include your primary Cardiologist (physician) and Advanced Practice Providers (APPs -  Physician Assistants and Nurse Practitioners) who all work together to provide you with the care you need, when you need it.  We recommend signing up for the patient portal called "MyChart".  Sign up information is provided on this After Visit Summary.  MyChart is used to connect with patients for Virtual Visits (Telemedicine).  Patients are able to view lab/test results, encounter notes, upcoming appointments, etc.  Non-urgent messages can be sent to your provider as well.   To learn more about what you can do with MyChart, go to [email or web address].    Your next appointment:   6 month(s)  The format for your next appointment:   In Person  Provider:    You may see Muhammad Arida, MD or one of the following Advanced Practice Providers on your designated Care Team:    Christopher Berge, NP  Ryan Dunn, PA-C  Jacquelyn Visser, PA-C    Other Instructions N/A  

## 2020-05-29 ENCOUNTER — Other Ambulatory Visit
Admission: RE | Admit: 2020-05-29 | Discharge: 2020-05-29 | Disposition: A | Discharge status: Home / Self Care | Payer: Medicare HMO | Source: Ambulatory Visit | Attending: Gastroenterology | Admitting: Gastroenterology

## 2020-05-29 DIAGNOSIS — Z20822 Contact with and (suspected) exposure to covid-19: Secondary | ICD-10-CM | POA: Insufficient documentation

## 2020-05-29 DIAGNOSIS — Z01812 Encounter for preprocedural laboratory examination: Secondary | ICD-10-CM | POA: Diagnosis not present

## 2020-05-29 LAB — SARS CORONAVIRUS 2 (TAT 6-24 HRS): SARS Coronavirus 2: NEGATIVE

## 2020-06-01 ENCOUNTER — Telehealth: Payer: Self-pay | Admitting: Gastroenterology

## 2020-06-01 NOTE — Telephone Encounter (Signed)
Called patient's daughter-Tanya Cole and she stated that her mother had started taking the Sutabs this AM by mistake at 10 AM and right after taking it, she vomited everything. Patient's daughter stated that she did not want to try again at this time since she did not want to take the Sutabs again. Tanya Cole also stated that her mother did not want to reschedule at this time because she is 86 years old and she is not up-to-it again. I had told Tanya Cole that this was going to be informed to the provider so I asked to put her on hold and she agreed. I then mentioned what had happened with the patient and her thoughts about the whole situation. Dr. Maximino Greenland stated that if the patient wanted to schedule an appointment with her so they could talk about options. I then came back to the call and told Tanya Cole what Dr. Maximino Greenland was recommending in the meantime and they agreed on having an appointment with her. This was scheduled to be on 06/15/2020 as a virtual visit per patient's request. They didn't have further questions and they apologized. I told them that it was fine.

## 2020-06-01 NOTE — Telephone Encounter (Signed)
Patient's Daughter(Pamela Billings)called and wanted to know if her Mom needs to reschedule procedure. Her Mom started vomiting after taking Prep medicine to early. Clinical staff was informed.

## 2020-06-02 ENCOUNTER — Encounter: Admission: RE | Payer: Self-pay | Source: Home / Self Care

## 2020-06-02 ENCOUNTER — Ambulatory Visit: Admission: RE | Admit: 2020-06-02 | Payer: Medicare Other | Source: Home / Self Care | Admitting: Gastroenterology

## 2020-06-02 SURGERY — ESOPHAGOGASTRODUODENOSCOPY (EGD) WITH PROPOFOL
Anesthesia: General

## 2020-06-03 ENCOUNTER — Other Ambulatory Visit: Payer: Self-pay | Admitting: Internal Medicine

## 2020-06-05 ENCOUNTER — Telehealth: Payer: Self-pay | Admitting: Internal Medicine

## 2020-06-05 MED ORDER — PROAIR HFA 108 (90 BASE) MCG/ACT IN AERS: 2.0000 | INHALATION_SPRAY | RESPIRATORY_TRACT | 1 refills | Status: DC | PRN

## 2020-06-05 NOTE — Telephone Encounter (Signed)
Daughter called and states that pt needs this filled ASAP albuterol (PROVENTIL) (2.5 MG/3ML) 0.083% nebulizer solution

## 2020-06-05 NOTE — Addendum Note (Signed)
Addended by: Elise Benne T on: 06/05/2020 04:12 PM   Modules accepted: Orders

## 2020-06-08 ENCOUNTER — Other Ambulatory Visit: Payer: Self-pay

## 2020-06-08 ENCOUNTER — Ambulatory Visit (INDEPENDENT_AMBULATORY_CARE_PROVIDER_SITE_OTHER): Payer: Medicare Other

## 2020-06-08 ENCOUNTER — Telehealth: Payer: Self-pay

## 2020-06-08 ENCOUNTER — Ambulatory Visit (INDEPENDENT_AMBULATORY_CARE_PROVIDER_SITE_OTHER): Payer: Medicare Other | Admitting: Internal Medicine

## 2020-06-08 ENCOUNTER — Encounter: Payer: Self-pay | Admitting: Internal Medicine

## 2020-06-08 VITALS — BP 110/72 | HR 85 | Temp 99.2°F | Resp 16 | Ht 61.0 in | Wt 115.4 lb

## 2020-06-08 DIAGNOSIS — J4520 Mild intermittent asthma, uncomplicated: Secondary | ICD-10-CM | POA: Diagnosis not present

## 2020-06-08 DIAGNOSIS — E7800 Pure hypercholesterolemia, unspecified: Secondary | ICD-10-CM

## 2020-06-08 DIAGNOSIS — E1159 Type 2 diabetes mellitus with other circulatory complications: Secondary | ICD-10-CM

## 2020-06-08 DIAGNOSIS — R634 Abnormal weight loss: Secondary | ICD-10-CM | POA: Diagnosis not present

## 2020-06-08 DIAGNOSIS — I10 Essential (primary) hypertension: Secondary | ICD-10-CM

## 2020-06-08 DIAGNOSIS — I2510 Atherosclerotic heart disease of native coronary artery without angina pectoris: Secondary | ICD-10-CM

## 2020-06-08 DIAGNOSIS — E611 Iron deficiency: Secondary | ICD-10-CM

## 2020-06-08 DIAGNOSIS — R0602 Shortness of breath: Secondary | ICD-10-CM

## 2020-06-08 DIAGNOSIS — R062 Wheezing: Secondary | ICD-10-CM | POA: Diagnosis not present

## 2020-06-08 MED ORDER — ALBUTEROL SULFATE (2.5 MG/3ML) 0.083% IN NEBU: 2.5000 mg | INHALATION_SOLUTION | RESPIRATORY_TRACT | 1 refills | Status: DC | PRN

## 2020-06-08 MED ORDER — PREDNISONE 10 MG PO TABS: ORAL_TABLET | ORAL | 0 refills | Status: DC

## 2020-06-08 MED ORDER — METOPROLOL TARTRATE 25 MG PO TABS: 25.0000 mg | ORAL_TABLET | Freq: Two times a day (BID) | ORAL | 1 refills | Status: DC

## 2020-06-08 NOTE — Patient Instructions (Signed)
Use the advair twice a day  Use the nebulizer as needed.

## 2020-06-08 NOTE — Telephone Encounter (Signed)
Patient made aware of zio monitor results with verbalized understanding. Patient is agreeable with increasing Metoprolol to 25 mg bid. Rx has been sent to the patient pharmacy.

## 2020-06-08 NOTE — Telephone Encounter (Signed)
-----   Message from Alver Sorrow, NP sent at 06/06/2020  1:52 PM EDT ----- Monitor with no evidence of atrial fibrillation. Occasional early heart beat. She did have multiple episodes of short bursts of a fast regular heart beat. To prevent this from happening as often, recommend she increase her Metoprolol to 25mg  twice daily (she presently takes a half tablet and will switch to a whole tablet).

## 2020-06-08 NOTE — Progress Notes (Signed)
[name], female   DOB: 26-Nov-1932, 86 y.o.   MRN: [id number]   Subjective:    [name], female    DOB: [birth year], 86 y.o.   MRN: [id number]  HPI This visit occurred during the SARS-CoV-2 public health emergency.  Safety protocols were in place, including screening questions prior to the visit, additional usage of staff PPE, and extensive cleaning of exam room while observing appropriate contact time as indicated for disinfecting solutions.  Patient here for a scheduled follow up.  States she has bee doing relatively well. Has a history of asthma.  Previously followed by Dr Raul Del.  Noticed recently some increased congestion and increased sob.  Started noticing an increase in symptoms over the last 24-36 hours.  She has intermittent flares with her breathing.  States this feels like the start of a flare.  No fever.  No chest pain.  no nausea, vomiting or diarrhea.  No sore throat.  Some cough at night.  Blood sugars in the 120s.  Tolerates prednisone and has to use prn with flares.  Sees Dr Fletcher Anon.  Has known CAD.  Last evaluated 05/28/20.  Stable.  plavic discontinued and she continues on aspirin daily.  Handling stress.  Seeing hematology.  Receiving IV iron.  No abdominal pain.  Bowels moving.     Past Medical History:  Diagnosis Date  . Aortic insufficiency    a. TTE 12/19: EF 55-60%, probable HK of the mid apical anterior septal myocardium, Gr1DD, mild AI, mildly dilated LA  . Asthma   . CAD (coronary artery disease)    a. NSTEMI 12/19; b. LHC 10/08/18: LM minimal luminal irregs, mLAD-1 95% s/p PCI/DES, mLAD-2 60%, LCx mild diffuse disease throughout, RCA minimal luminal irregs  . Diabetes mellitus ([place])   . Hypercholesterolemia   . Hypertension   . Myocardial infarction ([place])   . Osteopenia   . Polymyalgia rheumatica syndrome ([place])   . Reactive airway disease    Past Surgical History:  Procedure Laterality Date  . ABDOMINAL HYSTERECTOMY  1981     prolapse and bleeding, ovaries not removed  . BREAST EXCISIONAL BIOPSY Right   . CHOLECYSTECTOMY N/A 09/02/2019   Procedure: LAPAROSCOPIC CHOLECYSTECTOMY WITH INTRAOPERATIVE CHOLANGIOGRAM;  Surgeon: Olean Ree, MD;  Location: [facility];  Service: General;  Laterality: N/A;  . CORONARY STENT INTERVENTION N/A 10/08/2018   Procedure: CORONARY STENT INTERVENTION;  Surgeon: Wellington Hampshire, MD;  Location: [place] [facility];  Service: Cardiovascular;  Laterality: N/A;  . ENDOSCOPIC RETROGRADE CHOLANGIOPANCREATOGRAPHY (ERCP) WITH PROPOFOL N/A 08/08/2019   Procedure: ENDOSCOPIC RETROGRADE CHOLANGIOPANCREATOGRAPHY (ERCP) WITH PROPOFOL;  Surgeon: Lucilla Lame, MD;  Location: [facility];  Service: Endoscopy;  Laterality: N/A;  . LEFT HEART CATH AND CORONARY ANGIOGRAPHY N/A 10/08/2018   Procedure: LEFT HEART CATH AND CORONARY ANGIOGRAPHY;  Surgeon: Wellington Hampshire, MD;  Location: [place] [facility];  Service: Cardiovascular;  Laterality: N/A;  . UMBILICAL HERNIA REPAIR  7/94   Family History  Problem Relation Age of Onset  . Heart attack Father   . Arthritis Mother   . Heart disease Mother   . Throat cancer Sister   . Parkinson's disease Sister   . COPD Brother    Social History   Socioeconomic History  . Marital status: Widowed    Spouse name: Not on file  . Number of children: 3  . Years of education: Not on file  . Highest education level: Not on file  Occupational History  .  Not on file  Tobacco Use  . Smoking status: Never Smoker  . Smokeless tobacco: Never Used  Vaping Use  . Vaping Use: Never used  Substance and Sexual Activity  . Alcohol use: No    Alcohol/week: 0.0 standard drinks  . Drug use: No  . Sexual activity: Not Currently  Other Topics Concern  . Not on file  Social History Narrative   No smoking; no alcohol; in [place]; 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    Social Determinants of Health   Financial Resource Strain:   . Difficulty of Paying  Living Expenses:   Food Insecurity:   . Worried About Charity fundraiser in the Last Year:   . Arboriculturist in the Last Year:   Transportation Needs:   . Film/video editor (Medical):   Marland Kitchen Lack of Transportation (Non-Medical):   Physical Activity:   . Days of Exercise per Week:   . Minutes of Exercise per Session:   Stress:   . Feeling of Stress :   Social Connections: Moderately Integrated  . Frequency of Communication with Friends and Family: More than three times a week  . Frequency of Social Gatherings with Friends and Family: More than three times a week  . Attends Religious Services: More than 4 times per year  . Active Member of Clubs or Organizations: Yes  . Attends Archivist Meetings: Not on file  . Marital Status: Widowed    Outpatient Encounter Medications as of 06/08/2020  Medication Sig  . ADVAIR DISKUS 250-50 MCG/DOSE AEPB INHALE ONE PUFF BY MOUTH EVERY 12 HOURS. RINSE MOUTH AFTER EACH USE (Patient taking differently: Inhale 1 puff into the lungs 2 (two) times daily. )  . albuterol (PROVENTIL) (2.5 MG/3ML) 0.083% nebulizer solution Take 3 mLs (2.5 mg total) by nebulization every 4 (four) hours as needed for wheezing or shortness of breath.  Marland Kitchen alendronate (FOSAMAX) 70 MG tablet Take 70 mg by mouth once a week.   Marland Kitchen aspirin 81 MG tablet Take 81 mg by mouth daily.  Marland Kitchen atorvastatin (LIPITOR) 80 MG tablet Take 1 tablet (80 mg total) by mouth daily at 6 PM.  . cyanocobalamin (,VITAMIN B-12,) 1000 MCG/ML injection Inject into the muscle every 30 (thirty) days.  . ferrous sulfate 325 (65 FE) MG tablet Take 325 mg by mouth daily with breakfast.  . fluticasone (FLONASE) 50 MCG/ACT nasal spray Use 2 spray(s) in each nostril once daily  . ibuprofen (ADVIL) 400 MG tablet Take 1 tablet (400 mg total) by mouth every 8 (eight) hours as needed for mild pain or moderate pain.  . metFORMIN (GLUCOPHAGE) 500 MG tablet TAKE 1 TABLET BY MOUTH TWICE DAILY WITH A MEAL  .  metoprolol tartrate (LOPRESSOR) 25 MG tablet Take 1 tablet (25 mg total) by mouth 2 (two) times daily.  . Multiple Vitamins-Minerals (VISION FORMULA/LUTEIN PO) Take by mouth daily.  . Nebulizers (COMPRESSOR/NEBULIZER) MISC 1 Units by Does not apply route as directed.  . nitroGLYCERIN (NITROSTAT) 0.4 MG SL tablet Place 1 tablet (0.4 mg total) under the tongue every 5 (five) minutes as needed for chest pain.  Marland Kitchen PROAIR HFA 108 (90 Base) MCG/ACT inhaler Inhale 2 puffs into the lungs every 4 (four) hours as needed for wheezing or shortness of breath.  . Sodium Sulfate-Mag Sulfate-KCl (SUTAB) [phone] MG TABS At 5 PM take 12 tablets using the 8 oz cup provided in the kit drinking 5 cups of water and 5 hours before your procedure  repeat the same process.  . Vitamin D, Ergocalciferol, (DRISDOL) 50000 units CAPS capsule Take 50,000 Units by mouth once a week.  . [DISCONTINUED] lisinopril (ZESTRIL) 20 MG tablet Take 1 tablet (20 mg total) by mouth daily.  . predniSONE (DELTASONE) 10 MG tablet Take 6 tablets x 1 day and then decrease by 1/2 tablet per day until down to zero mg.   No facility-administered encounter medications on file as of 06/08/2020.    Review of Systems  Constitutional: Negative for appetite change and unexpected weight change.  HENT: Positive for congestion. Negative for sinus pressure and sore throat.   Respiratory: Positive for cough, shortness of breath and wheezing. Negative for chest tightness.   Cardiovascular: Negative for chest pain, palpitations and leg swelling.  Gastrointestinal: Negative for abdominal pain, diarrhea, nausea and vomiting.  Genitourinary: Negative for difficulty urinating and dysuria.  Musculoskeletal: Negative for joint swelling and myalgias.  Skin: Negative for color change and rash.  Neurological: Negative for dizziness, light-headedness and headaches.  Psychiatric/Behavioral: Negative for agitation and dysphoric mood.       Objective:    Physical  Exam Vitals reviewed.  Constitutional:      General: She is not in acute distress.    Appearance: Normal appearance.  HENT:     Head: Normocephalic and atraumatic.     Right Ear: External ear normal.     Left Ear: External ear normal.  Eyes:     General: No scleral icterus.       Right eye: No discharge.        Left eye: No discharge.     Conjunctiva/sclera: Conjunctivae normal.  Neck:     Thyroid: No thyromegaly.  Cardiovascular:     Rate and Rhythm: Normal rate and regular rhythm.  Pulmonary:     Effort: No respiratory distress.     Breath sounds: Wheezing present.  Abdominal:     General: Bowel sounds are normal.     Palpations: Abdomen is soft.     Tenderness: There is no abdominal tenderness.  Musculoskeletal:        General: No swelling or tenderness.     Cervical back: Neck supple. No tenderness.  Lymphadenopathy:     Cervical: No cervical adenopathy.  Skin:    Findings: No erythema or rash.  Neurological:     Mental Status: She is alert.  Psychiatric:        Mood and Affect: Mood normal.        Behavior: Behavior normal.     BP 110/72 (BP Location: Left Arm, Patient Position: Sitting, Cuff Size: Normal)   Pulse 85   Temp 99.2 F (37.3 C) (Oral)   Resp 16   Ht '5\' 1"'  (1.549 m)   Wt 115 lb 6.4 oz (52.3 kg)   SpO2 95%   BMI 21.80 kg/m  Wt Readings from Last 3 Encounters:  06/08/20 115 lb 6.4 oz (52.3 kg)  05/28/20 117 lb 8 oz (53.3 kg)  05/19/20 118 lb 12.8 oz (53.9 kg)     Lab Results  Component Value Date   WBC 10.6 (H) 05/11/2020   HGB 12.1 05/11/2020   HCT 38.0 05/11/2020   PLT 385 05/11/2020   GLUCOSE 128 (H) 05/11/2020   CHOL 169 03/23/2020   TRIG 155.0 (H) 03/23/2020   HDL 48.90 03/23/2020   LDLDIRECT 90.0 09/17/2019   LDLCALC 90 03/23/2020   ALT 8 03/23/2020   AST 11 03/23/2020   NA 140 05/11/2020   K 4.0  05/11/2020   CL 103 05/11/2020   CREATININE 0.75 05/11/2020   BUN 17 05/11/2020   CO2 25 05/11/2020   TSH 3.59 09/17/2019    HGBA1C 6.9 (H) 03/23/2020   MICROALBUR 4.3 (H) 05/24/2019       Assessment & Plan:   Problem List Items Addressed This Visit    Weight loss    Weight 115 pounds.  States appetite is good. Eating well.  Follow.  No GI symptoms.        SOB (shortness of breath) - Primary    Has noticed some sob with cough and congestion.  Feels like her typical flares.  No chest pain.  No nausea or vomiting.  Bowels moving.  Continue advair and rescue inhaler.  Had nebs -- instructed to use.  Prednisone taper.  Hold abx.  Check cxr.  Call with update.        Relevant Orders   DG Chest 2 View (Completed)   Reactive airway disease    Continue advair scheduled.  Nebs as directed.  Treat with prednisone taper as directed.  Follow.        Iron deficiency    Being followed by hematology.  Receiving IV iron now.  Tolerating.  hgb improved.  Follow.        Hypertension    Blood pressure doing well.  On lisinopril.  Follow pressures.  Follow metabolic panel.        Hypercholesterolemia    On lipitor. Follow lipid panel and liver function tests.        Diabetes mellitus with cardiac complication (HCC)    Sugars as outlined.  Discussed increased sugar with prednisone.  Follow met b and a1c.  On metformin.        Coronary artery disease    Known CAD.  S/p PCE/DES - mid LAD.  Off plavix now.  On aspirin.  Saw cardiology 05/28/20 - stable.  Continue risk factor modification.           Einar Pheasant, MD

## 2020-06-10 ENCOUNTER — Other Ambulatory Visit: Payer: Self-pay | Admitting: Physician Assistant

## 2020-06-12 ENCOUNTER — Ambulatory Visit: Payer: Medicare Other | Admitting: Internal Medicine

## 2020-06-14 ENCOUNTER — Encounter: Payer: Self-pay | Admitting: Internal Medicine

## 2020-06-14 NOTE — Assessment & Plan Note (Signed)
On lipitor.  Follow lipid panel and liver function tests.   

## 2020-06-14 NOTE — Assessment & Plan Note (Signed)
Being followed by hematology.  Receiving IV iron now.  Tolerating.  hgb improved.  Follow.

## 2020-06-14 NOTE — Assessment & Plan Note (Signed)
Weight 115 pounds.  States appetite is good. Eating well.  Follow.  No GI symptoms.

## 2020-06-14 NOTE — Assessment & Plan Note (Signed)
Blood pressure doing well.  On lisinopril.  Follow pressures.  Follow metabolic panel.  

## 2020-06-14 NOTE — Assessment & Plan Note (Signed)
Has noticed some sob with cough and congestion.  Feels like her typical flares.  No chest pain.  No nausea or vomiting.  Bowels moving.  Continue advair and rescue inhaler.  Had nebs -- instructed to use.  Prednisone taper.  Hold abx.  Check cxr.  Call with update.

## 2020-06-14 NOTE — Assessment & Plan Note (Signed)
Known CAD.  S/p PCE/DES - mid LAD.  Off plavix now.  On aspirin.  Saw cardiology 05/28/20 - stable.  Continue risk factor modification.

## 2020-06-14 NOTE — Assessment & Plan Note (Addendum)
Sugars as outlined.  Discussed increased sugar with prednisone.  Follow met b and a1c.  On metformin.

## 2020-06-14 NOTE — Assessment & Plan Note (Signed)
Continue advair scheduled.  Nebs as directed.  Treat with prednisone taper as directed.  Follow.

## 2020-06-15 ENCOUNTER — Encounter: Payer: Self-pay | Admitting: Gastroenterology

## 2020-06-15 ENCOUNTER — Telehealth (INDEPENDENT_AMBULATORY_CARE_PROVIDER_SITE_OTHER): Payer: Medicare Other | Admitting: Gastroenterology

## 2020-06-15 DIAGNOSIS — D509 Iron deficiency anemia, unspecified: Secondary | ICD-10-CM

## 2020-06-15 NOTE — Progress Notes (Signed)
Vonda Antigua, MD [address]  [place]  [place], [place] [address]  Main: [phone]  Fax: [phone]   Primary Care Physician: Einar Pheasant, MD  Virtual Visit via Telephone Note  I connected with patient on 06/15/20 at 10:00 AM EDT by telephone and verified that I am speaking with the correct person using two identifiers.   I discussed the limitations, risks, security and privacy concerns of performing an evaluation and management service by telephone and the availability of in person appointments. I also discussed with the patient that there may be a patient responsible charge related to this service. The patient expressed understanding and agreed to proceed.  Location of Patient: Home Location of Provider: Home Persons involved: Patient and provider only during the visit (nursing staff and front desk staff was involved in communicating with the patient prior to the appointment, reviewing medications and checking them in)   History of Present Illness: Chief Complaint  Patient presents with  . Iron deficiency anemia     HPI: Tanya Cole is a 86 y.o. female here for follow-up of iron deficiency.  EGD and colonoscopy were scheduled but patient did not tolerate the prep and did not want to reschedule with you prep.  Last prep was 2 tabs therefore not liquid prep and she had issues with nausea vomiting with that as well.  The patient denies abdominal or flank pain, anorexia, nausea or vomiting, dysphagia, change in bowel habits or black or bloody stools or weight loss.  Previous history: Patient has never had a full colonoscopy.  Colonoscopies were attempted by Dr. Gustavo Lah in 2010 in 2015.  2015 was a poor prep and was aborted.  2010 colonoscopy was incomplete as well.  Current Outpatient Medications  Medication Sig Dispense Refill  . ADVAIR DISKUS 250-50 MCG/DOSE AEPB INHALE ONE PUFF BY MOUTH EVERY 12 HOURS. RINSE MOUTH AFTER EACH USE (Patient taking  differently: Inhale 1 puff into the lungs 2 (two) times daily. ) 60 each 5  . albuterol (PROVENTIL) (2.5 MG/3ML) 0.083% nebulizer solution Take 3 mLs (2.5 mg total) by nebulization every 4 (four) hours as needed for wheezing or shortness of breath. 75 mL 1  . alendronate (FOSAMAX) 70 MG tablet Take 70 mg by mouth once a week.     Marland Kitchen aspirin 81 MG tablet Take 81 mg by mouth daily.    Marland Kitchen atorvastatin (LIPITOR) 80 MG tablet Take 1 tablet (80 mg total) by mouth daily at 6 PM. 90 tablet 3  . cyanocobalamin (,VITAMIN B-12,) 1000 MCG/ML injection Inject into the muscle every 30 (thirty) days.    . ferrous sulfate 325 (65 FE) MG tablet Take 325 mg by mouth daily with breakfast.    . fluticasone (FLONASE) 50 MCG/ACT nasal spray Use 2 spray(s) in each nostril once daily 16 g 0  . ibuprofen (ADVIL) 400 MG tablet Take 1 tablet (400 mg total) by mouth every 8 (eight) hours as needed for mild pain or moderate pain. 30 tablet 0  . lisinopril (ZESTRIL) 20 MG tablet Take 1 tablet by mouth once daily 90 tablet 1  . metFORMIN (GLUCOPHAGE) 500 MG tablet TAKE 1 TABLET BY MOUTH TWICE DAILY WITH A MEAL 180 tablet 0  . metoprolol tartrate (LOPRESSOR) 25 MG tablet Take 1 tablet (25 mg total) by mouth 2 (two) times daily. 90 tablet 1  . Multiple Vitamins-Minerals (VISION FORMULA/LUTEIN PO) Take by mouth daily.    . Nebulizers (COMPRESSOR/NEBULIZER) MISC 1 Units by Does not apply  route as directed. 1 each 0  . nitroGLYCERIN (NITROSTAT) 0.4 MG SL tablet Place 1 tablet (0.4 mg total) under the tongue every 5 (five) minutes as needed for chest pain. 30 tablet 12  . predniSONE (DELTASONE) 10 MG tablet Take 6 tablets x 1 day and then decrease by 1/2 tablet per day until down to zero mg. 39 tablet 0  . PROAIR HFA 108 (90 Base) MCG/ACT inhaler Inhale 2 puffs into the lungs every 4 (four) hours as needed for wheezing or shortness of breath. 18 g 1  . Sodium Sulfate-Mag Sulfate-KCl (SUTAB) 8601071769 MG TABS At 5 PM take 12 tablets  using the 8 oz cup provided in the kit drinking 5 cups of water and 5 hours before your procedure repeat the same process. 24 tablet 0  . Vitamin D, Ergocalciferol, (DRISDOL) 50000 units CAPS capsule Take 50,000 Units by mouth once a week.  3   No current facility-administered medications for this visit.    Allergies as of 06/15/2020 - Review Complete 06/15/2020  Allergen Reaction Noted  . Tramadol Itching and Nausea And Vomiting 04/29/2016    Review of Systems:    All systems reviewed and negative except where noted in HPI.   Observations/Objective:  Labs: CMP     Component Value Date/Time   NA 140 05/11/2020 1248   NA 137 10/18/2018 1022   NA 135 (L) 02/18/2013 2004   K 4.0 05/11/2020 1248   K 4.1 02/18/2013 2004   CL 103 05/11/2020 1248   CL 102 02/18/2013 2004   CO2 25 05/11/2020 1248   CO2 27 02/18/2013 2004   GLUCOSE 128 (H) 05/11/2020 1248   GLUCOSE 186 (H) 02/18/2013 2004   BUN 17 05/11/2020 1248   BUN 14 10/18/2018 1022   BUN 26 (H) 02/18/2013 2004   CREATININE 0.75 05/11/2020 1248   CREATININE 0.69 04/29/2016 1613   CALCIUM 9.8 05/11/2020 1248   CALCIUM 9.5 02/18/2013 2004   PROT 6.6 03/23/2020 0912   PROT 7.5 02/18/2013 2004   ALBUMIN 4.2 03/23/2020 0912   ALBUMIN 3.6 02/18/2013 2004   AST 11 03/23/2020 0912   AST 13 (L) 02/18/2013 2004   ALT 8 03/23/2020 0912   ALT 15 02/18/2013 2004   ALKPHOS 66 03/23/2020 0912   ALKPHOS 71 02/18/2013 2004   BILITOT 0.4 03/23/2020 0912   BILITOT 0.5 02/18/2013 2004   GFRNONAA >60 05/11/2020 1248   GFRNONAA 52 (L) 02/18/2013 2004   GFRAA >60 05/11/2020 1248   GFRAA >60 02/18/2013 2004   Lab Results  Component Value Date   WBC 10.6 (H) 05/11/2020   HGB 12.1 05/11/2020   HCT 38.0 05/11/2020   MCV 81.2 05/11/2020   PLT 385 05/11/2020    Imaging Studies: DG Chest 2 View  Result Date: 06/08/2020 CLINICAL DATA:  Wheezing and shortness of breath. EXAM: CHEST - 2 VIEW COMPARISON:  Radiograph 04/06/2020. remote  chest CT 02/11/2010 FINDINGS: Mild chronic interstitial coarsening. No acute airspace disease. The heart is normal in size. Coronary stent versus coronary artery calcification. Unchanged mediastinal contours with aortic atherosclerosis. Calcified granuloma and minimal scarring at the left lung base. No pulmonary edema, pleural effusion or pneumothorax. Mild degenerative change in the spine. IMPRESSION: No acute findings. Mild chronic interstitial coarsening and left lung base scarring. Aortic Atherosclerosis (ICD10-I70.0). Electronically Signed   By: Keith Rake M.D.   On: 06/08/2020 20:59   LONG TERM MONITOR (3-14 DAYS)  Result Date: 06/05/2020 14-day ZIO monitor: Normal sinus rhythm with an  average heart rate of 75 bpm. 429 episodes of supraventricular tachycardia the longest lasted 19 seconds with a rate of 133 bpm. Occasional PACs with a burden of 3.2%. Rare PVCs with a burden of less than 1%.   Assessment and Plan:   Tanya Cole is a 86 y.o. y/o female with iron deficiency anemia  Assessment and Plan: I discussed several options with the patient, including proceeding with EGD and colonoscopy with a different prep, versus conservative management.  Patient would like to reattempt EGD and colonoscopy with a different prep  Will try Clenpiq as  she may better tolerate it in regard to nausea vomiting  Follow Up Instructions:    I discussed the assessment and treatment plan with the patient. The patient was provided an opportunity to ask questions and all were answered. The patient agreed with the plan and demonstrated an understanding of the instructions.   The patient was advised to call back or seek an in-person evaluation if the symptoms worsen or if the condition fails to improve as anticipated.  I provided 12 minutes of non-face-to-face time during this encounter. Additional time was spent in reviewing patient's chart, placing orders etc.   Virgel Manifold,  MD  Speech recognition software was used to dictate this note.

## 2020-06-16 MED ORDER — CLENPIQ 10-3.5-12 MG-GM -GM/160ML PO SOLN
ORAL | 0 refills | Status: DC
Start: 2020-06-16 — End: 2020-08-10

## 2020-06-16 NOTE — Addendum Note (Signed)
Addended by: Adela Ports on: 06/16/2020 11:39 AM   Modules accepted: Orders

## 2020-06-18 ENCOUNTER — Telehealth: Payer: Medicare HMO | Admitting: Gastroenterology

## 2020-06-22 ENCOUNTER — Telehealth: Payer: Self-pay

## 2020-06-22 NOTE — Telephone Encounter (Signed)
Called patient to ask how she was feeling and if she was preparing for her colonoscopy for 06/25/2020. Patient stated that she was 86 years old and she was not going to prepare for another colonoscopy. She stated that she threw it all up last time and she was not going to do it again. I had told her that I had spoken to Patsi Sears daughter POA and she had agreed on scheduling the colonoscopy for 06/25/2020. However, patient stated to go ahead and cancel it because she was not going to do it.  I told her to call us if she needed anything and I also told her that I will let Dr. Maximino Greenland know about her decision and she stated that it was fine. I then called Trish from endoscopy and informed her to cancel.

## 2020-06-30 ENCOUNTER — Ambulatory Visit: Payer: Medicare HMO

## 2020-07-03 ENCOUNTER — Other Ambulatory Visit: Payer: Self-pay

## 2020-07-03 ENCOUNTER — Ambulatory Visit (INDEPENDENT_AMBULATORY_CARE_PROVIDER_SITE_OTHER): Payer: Medicare HMO | Admitting: Internal Medicine

## 2020-07-03 ENCOUNTER — Encounter: Payer: Self-pay | Admitting: Internal Medicine

## 2020-07-03 VITALS — BP 120/70 | HR 65 | Temp 98.2°F | Resp 16 | Ht 61.0 in | Wt 116.0 lb

## 2020-07-03 DIAGNOSIS — R634 Abnormal weight loss: Secondary | ICD-10-CM

## 2020-07-03 DIAGNOSIS — M25512 Pain in left shoulder: Secondary | ICD-10-CM

## 2020-07-03 DIAGNOSIS — R0602 Shortness of breath: Secondary | ICD-10-CM | POA: Diagnosis not present

## 2020-07-03 DIAGNOSIS — I2510 Atherosclerotic heart disease of native coronary artery without angina pectoris: Secondary | ICD-10-CM | POA: Diagnosis not present

## 2020-07-03 DIAGNOSIS — E7800 Pure hypercholesterolemia, unspecified: Secondary | ICD-10-CM | POA: Diagnosis not present

## 2020-07-03 DIAGNOSIS — I10 Essential (primary) hypertension: Secondary | ICD-10-CM

## 2020-07-03 DIAGNOSIS — E611 Iron deficiency: Secondary | ICD-10-CM

## 2020-07-03 DIAGNOSIS — E1159 Type 2 diabetes mellitus with other circulatory complications: Secondary | ICD-10-CM | POA: Diagnosis not present

## 2020-07-03 LAB — BASIC METABOLIC PANEL
BUN: 21 mg/dL (ref 6–23)
CO2: 21 mEq/L (ref 19–32)
Calcium: 9.5 mg/dL (ref 8.4–10.5)
Chloride: 107 mEq/L (ref 96–112)
Creatinine, Ser: 0.75 mg/dL (ref 0.40–1.20)
GFR: 73.43 mL/min (ref >60.00)
Glucose, Bld: 80 mg/dL (ref 70–99)
Potassium: 4.3 mEq/L (ref 3.5–5.1)
Sodium: 139 mEq/L (ref 135–145)

## 2020-07-03 LAB — MICROALBUMIN / CREATININE URINE RATIO
Creatinine,U: 152 mg/dL
Microalb Creat Ratio: 2 mg/g (ref 0.0–30.0)
Microalb, Ur: 3.1 mg/dL — ABNORMAL HIGH (ref 0.0–1.9)

## 2020-07-03 LAB — LIPID PANEL
Cholesterol: 192 mg/dL (ref 0–200)
HDL: 45.5 mg/dL (ref >39.00)
NonHDL: 146.04
Total CHOL/HDL Ratio: 4
Triglycerides: 225 mg/dL — ABNORMAL HIGH (ref 0.0–149.0)
VLDL: 45 mg/dL — ABNORMAL HIGH (ref 0.0–40.0)

## 2020-07-03 LAB — HEPATIC FUNCTION PANEL
ALT: 9 U/L (ref 0–35)
AST: 9 U/L (ref 0–37)
Albumin: 4.3 g/dL (ref 3.5–5.2)
Alkaline Phosphatase: 76 U/L (ref 39–117)
Bilirubin, Direct: 0 mg/dL (ref 0.0–0.3)
Total Bilirubin: 0.4 mg/dL (ref 0.2–1.2)
Total Protein: 7.2 g/dL (ref 6.0–8.3)

## 2020-07-03 LAB — LDL CHOLESTEROL, DIRECT: Direct LDL: 136 mg/dL

## 2020-07-03 LAB — HEMOGLOBIN A1C: Hgb A1c MFr Bld: 7.4 % — ABNORMAL HIGH (ref 4.6–6.5)

## 2020-07-03 NOTE — Progress Notes (Signed)
[name], female   DOB: Dec 28, 1932, 86 y.o.   MRN: [id number]   Subjective:    [name], female    DOB: Jan 08, 1933, 86 y.o.   MRN: [id number]  HPI .This visit occurred during the SARS-CoV-2 public health emergency.  Safety protocols were in place, including screening questions prior to the visit, additional usage of staff PPE, and extensive cleaning of exam room while observing appropriate contact time as indicated for disinfecting solutions.  Patient here for a scheduled follow up.  She reports she is doing relatively well.  Trying to stay active around her house.  Last visit with me 06/08/20 was experiencing some sob.  See note.  Treated with prednisone taper and continued nebs/inhalers.  CXR with no acute findings.  States her breathing is better.  No increased cough or congestion currently.  Eating.  No nausea or vomiting.  No abdominal pain.  Bowels moving.  Noticed some discomfort in her left shoulder.  Aggravated by certain movements.  Taking tylenol. Is better.  No known injury.  Blood pressure doing well.  Blood sugars in 120s.  Recently evlauated by GI for IDA.  Was to have EGD and colonoscopy.  She canceled.  Desires not to pursue further testing.    Past Medical History:  Diagnosis Date  . Aortic insufficiency    a. TTE 12/19: EF 55-60%, probable HK of the mid apical anterior septal myocardium, Gr1DD, mild AI, mildly dilated LA  . Asthma   . CAD (coronary artery disease)    a. NSTEMI 12/19; b. LHC 10/08/18: LM minimal luminal irregs, mLAD-1 95% s/p PCI/DES, mLAD-2 60%, LCx mild diffuse disease throughout, RCA minimal luminal irregs  . Diabetes mellitus ([facility])   . Hypercholesterolemia   . Hypertension   . Myocardial infarction ([facility])   . Osteopenia   . Polymyalgia rheumatica syndrome ([place])   . Reactive airway disease    Past Surgical History:  Procedure Laterality Date  . ABDOMINAL HYSTERECTOMY  1981   prolapse and bleeding, ovaries not  removed  . BREAST EXCISIONAL BIOPSY Right   . CHOLECYSTECTOMY N/A 09/02/2019   Procedure: LAPAROSCOPIC CHOLECYSTECTOMY WITH INTRAOPERATIVE CHOLANGIOGRAM;  Surgeon: Olean Ree, MD;  Location: [facility];  Service: General;  Laterality: N/A;  . CORONARY STENT INTERVENTION N/A 10/08/2018   Procedure: CORONARY STENT INTERVENTION;  Surgeon: Wellington Hampshire, MD;  Location: [facility];  Service: Cardiovascular;  Laterality: N/A;  . ENDOSCOPIC RETROGRADE CHOLANGIOPANCREATOGRAPHY (ERCP) WITH PROPOFOL N/A 08/08/2019   Procedure: ENDOSCOPIC RETROGRADE CHOLANGIOPANCREATOGRAPHY (ERCP) WITH PROPOFOL;  Surgeon: Lucilla Lame, MD;  Location: [facility];  Service: Endoscopy;  Laterality: N/A;  . LEFT HEART CATH AND CORONARY ANGIOGRAPHY N/A 10/08/2018   Procedure: LEFT HEART CATH AND CORONARY ANGIOGRAPHY;  Surgeon: Wellington Hampshire, MD;  Location: [facility];  Service: Cardiovascular;  Laterality: N/A;  . UMBILICAL HERNIA REPAIR  7/94   Family History  Problem Relation Age of Onset  . Heart attack Father   . Arthritis Mother   . Heart disease Mother   . Throat cancer Sister   . Parkinson's disease Sister   . COPD Brother    Social History   Socioeconomic History  . Marital status: Widowed    Spouse name: Not on file  . Number of children: 3  . Years of education: Not on file  . Highest education level: Not on file  Occupational History  . Not on file  Tobacco Use  . Smoking status: Never  Smoker  . Smokeless tobacco: Never Used  Vaping Use  . Vaping Use: Never used  Substance and Sexual Activity  . Alcohol use: No    Alcohol/week: 0.0 standard drinks  . Drug use: No  . Sexual activity: Not Currently  Other Topics Concern  . Not on file  Social History Narrative   No smoking; no alcohol; in [place]; 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    Social Determinants of Health   Financial Resource Strain:   . Difficulty of Paying Living Expenses: Not on file  Food  Insecurity:   . Worried About Charity fundraiser in the Last Year: Not on file  . Ran Out of Food in the Last Year: Not on file  Transportation Needs:   . Lack of Transportation (Medical): Not on file  . Lack of Transportation (Non-Medical): Not on file  Physical Activity:   . Days of Exercise per Week: Not on file  . Minutes of Exercise per Session: Not on file  Stress:   . Feeling of Stress : Not on file  Social Connections: Moderately Integrated  . Frequency of Communication with Friends and Family: More than three times a week  . Frequency of Social Gatherings with Friends and Family: More than three times a week  . Attends Religious Services: More than 4 times per year  . Active Member of Clubs or Organizations: Yes  . Attends Archivist Meetings: Not on file  . Marital Status: Widowed    Outpatient Encounter Medications as of 07/03/2020  Medication Sig  . ADVAIR DISKUS 250-50 MCG/DOSE AEPB INHALE ONE PUFF BY MOUTH EVERY 12 HOURS. RINSE MOUTH AFTER EACH USE (Patient taking differently: Inhale 1 puff into the lungs 2 (two) times daily. )  . albuterol (PROVENTIL) (2.5 MG/3ML) 0.083% nebulizer solution Take 3 mLs (2.5 mg total) by nebulization every 4 (four) hours as needed for wheezing or shortness of breath.  Marland Kitchen alendronate (FOSAMAX) 70 MG tablet Take 70 mg by mouth once a week.   Marland Kitchen aspirin 81 MG tablet Take 81 mg by mouth daily.  Marland Kitchen atorvastatin (LIPITOR) 80 MG tablet Take 1 tablet (80 mg total) by mouth daily at 6 PM.  . cyanocobalamin (,VITAMIN B-12,) 1000 MCG/ML injection Inject into the muscle every 30 (thirty) days.  . ferrous sulfate 325 (65 FE) MG tablet Take 325 mg by mouth daily with breakfast.  . fluticasone (FLONASE) 50 MCG/ACT nasal spray Use 2 spray(s) in each nostril once daily  . ibuprofen (ADVIL) 400 MG tablet Take 1 tablet (400 mg total) by mouth every 8 (eight) hours as needed for mild pain or moderate pain.  Marland Kitchen lisinopril (ZESTRIL) 20 MG tablet Take 1  tablet by mouth once daily  . metFORMIN (GLUCOPHAGE) 500 MG tablet TAKE 1 TABLET BY MOUTH TWICE DAILY WITH A MEAL  . metoprolol tartrate (LOPRESSOR) 25 MG tablet Take 1 tablet (25 mg total) by mouth 2 (two) times daily.  . Multiple Vitamins-Minerals (VISION FORMULA/LUTEIN PO) Take by mouth daily.  . Nebulizers (COMPRESSOR/NEBULIZER) MISC 1 Units by Does not apply route as directed.  . nitroGLYCERIN (NITROSTAT) 0.4 MG SL tablet Place 1 tablet (0.4 mg total) under the tongue every 5 (five) minutes as needed for chest pain.  Marland Kitchen PROAIR HFA 108 (90 Base) MCG/ACT inhaler Inhale 2 puffs into the lungs every 4 (four) hours as needed for wheezing or shortness of breath.  . Sod Picosulfate-Mag Ox-Cit Acd (CLENPIQ) 10-3.5-12 MG-GM -GM/160ML SOLN Take 1 bottle at 5  PM followed by five 8 oz cups of water and repeat 5 hours before procedure.  . Sodium Sulfate-Mag Sulfate-KCl (SUTAB) [id number] MG TABS At 5 PM take 12 tablets using the 8 oz cup provided in the kit drinking 5 cups of water and 5 hours before your procedure repeat the same process.  . Vitamin D, Ergocalciferol, (DRISDOL) 50000 units CAPS capsule Take 50,000 Units by mouth once a week.  . [DISCONTINUED] predniSONE (DELTASONE) 10 MG tablet Take 6 tablets x 1 day and then decrease by 1/2 tablet per day until down to zero mg.   No facility-administered encounter medications on file as of 07/03/2020.    Review of Systems  Constitutional: Negative for appetite change and unexpected weight change.  HENT: Negative for congestion and sinus pressure.   Respiratory: Negative for cough and chest tightness.        Breathing better.    Cardiovascular: Negative for chest pain, palpitations and leg swelling.  Gastrointestinal: Negative for abdominal pain, diarrhea, nausea and vomiting.  Genitourinary: Negative for difficulty urinating and dysuria.  Musculoskeletal: Negative for joint swelling and myalgias.       Left shoulder pain as outlined.    Skin:  Negative for color change and rash.  Neurological: Negative for dizziness, light-headedness and headaches.  Psychiatric/Behavioral: Negative for agitation and dysphoric mood.       Objective:    Physical Exam Vitals reviewed.  Constitutional:      General: She is not in acute distress.    Appearance: Normal appearance.  HENT:     Head: Normocephalic and atraumatic.     Right Ear: External ear normal.     Left Ear: External ear normal.  Eyes:     General: No scleral icterus.       Right eye: No discharge.        Left eye: No discharge.     Conjunctiva/sclera: Conjunctivae normal.  Neck:     Thyroid: No thyromegaly.  Cardiovascular:     Rate and Rhythm: Normal rate and regular rhythm.  Pulmonary:     Effort: No respiratory distress.     Breath sounds: Normal breath sounds. No wheezing.  Abdominal:     General: Bowel sounds are normal.     Palpations: Abdomen is soft.     Tenderness: There is no abdominal tenderness.  Musculoskeletal:        General: No swelling or tenderness.     Cervical back: Neck supple. No tenderness.  Lymphadenopathy:     Cervical: No cervical adenopathy.  Skin:    Findings: No erythema or rash.  Neurological:     Mental Status: She is alert.  Psychiatric:        Mood and Affect: Mood normal.        Behavior: Behavior normal.     BP 120/70   Pulse 65   Temp 98.2 F (36.8 C) (Oral)   Resp 16   Ht _0  (1.549 m)   Wt 116 lb (52.6 kg)   SpO2 99%   BMI 21.92 kg/m  Wt Readings from Last 3 Encounters:  07/03/20 116 lb (52.6 kg)  06/08/20 115 lb 6.4 oz (52.3 kg)  05/28/20 117 lb 8 oz (53.3 kg)     Lab Results  Component Value Date   WBC 10.6 (H) 05/11/2020   HGB 12.1 05/11/2020   HCT 38.0 05/11/2020   PLT 385 05/11/2020   GLUCOSE 80 07/03/2020   CHOL 192 07/03/2020   TRIG 225.0 (H)  07/03/2020   HDL 45.50 07/03/2020   LDLDIRECT 136.0 07/03/2020   LDLCALC 90 03/23/2020   ALT 9 07/03/2020   AST 9 07/03/2020   NA 139 07/03/2020     K 4.3 07/03/2020   CL 107 07/03/2020   CREATININE 0.75 07/03/2020   BUN 21 07/03/2020   CO2 21 07/03/2020   TSH 3.59 09/17/2019   HGBA1C 7.4 (H) 07/03/2020   MICROALBUR 3.1 (H) 07/03/2020       Assessment & Plan:   Problem List Items Addressed This Visit    Weight loss    Weight stable from recent check.  States is eating.  Declines GI w/up as outlined.  Continue to encourage increased po intake.       SOB (shortness of breath)    Treated recently with prednisone as outlined.  Breathing better.  Follow.        Left shoulder pain    Appears to be msk in origin.  Denies any injury or trauma.  Reproducible with certain position changes.  Taking tylenol. Improved.  Follow.       Iron deficiency    Being followed by hematology.  Receiving IV iron.  Was referred to GI for w/up.  Scheduled to have EGD and colonoscopy.  Canceled. And declines to have.  Follow cbc and iron.       Hypertension    Blood pressure doing well on lisinopril.  Follow pressures.  Follow metabolic panel.       Hypercholesterolemia    On lipitor.  Low cholesterol diet and exercise.  Follow lipid panel and liver function tests.        Relevant Orders   Hepatic function panel (Completed)   Lipid panel (Completed)   Diabetes mellitus with cardiac complication (HCC) - Primary    Sugars as outlined.  Follow met b and a1c.  On metformin.        Relevant Orders   Hemoglobin A1c (Completed)   Basic metabolic panel (Completed)   Microalbumin / creatinine urine ratio (Completed)   Coronary artery disease    Known CAD.  S/p PCE/DES - mid LAD.  Off plavix now. On aspirin.  Saw cardiology 05/28/20 - stable.  Continue risk factor modification.           Einar Pheasant, MD

## 2020-07-12 ENCOUNTER — Encounter: Payer: Self-pay | Admitting: Internal Medicine

## 2020-07-12 DIAGNOSIS — M25512 Pain in left shoulder: Secondary | ICD-10-CM | POA: Insufficient documentation

## 2020-07-12 NOTE — Assessment & Plan Note (Signed)
Blood pressure doing well on lisinopril.  Follow pressures.  Follow metabolic panel.  

## 2020-07-12 NOTE — Assessment & Plan Note (Signed)
Weight stable from recent check.  States is eating.  Declines GI w/up as outlined.  Continue to encourage increased po intake.

## 2020-07-12 NOTE — Assessment & Plan Note (Signed)
Sugars as outlined.  Follow met b and a1c.  On metformin.   

## 2020-07-12 NOTE — Assessment & Plan Note (Signed)
Treated recently with prednisone as outlined.  Breathing better.  Follow.

## 2020-07-12 NOTE — Assessment & Plan Note (Signed)
Being followed by hematology.  Receiving IV iron.  Was referred to GI for w/up.  Scheduled to have EGD and colonoscopy.  Canceled. And declines to have.  Follow cbc and iron.

## 2020-07-12 NOTE — Assessment & Plan Note (Signed)
On lipitor.  Low cholesterol diet and exercise.  Follow lipid panel and liver function tests.   

## 2020-07-12 NOTE — Assessment & Plan Note (Signed)
Appears to be msk in origin.  Denies any injury or trauma.  Reproducible with certain position changes.  Taking tylenol. Improved.  Follow.

## 2020-07-12 NOTE — Assessment & Plan Note (Signed)
Known CAD.  S/p PCE/DES - mid LAD.  Off plavix now.  On aspirin.  Saw cardiology 05/28/20 - stable.  Continue risk factor modification.  

## 2020-07-20 ENCOUNTER — Inpatient Hospital Stay: Payer: Medicare HMO

## 2020-07-20 ENCOUNTER — Other Ambulatory Visit: Payer: Self-pay

## 2020-07-20 ENCOUNTER — Inpatient Hospital Stay (HOSPITAL_BASED_OUTPATIENT_CLINIC_OR_DEPARTMENT_OTHER): Payer: Medicare HMO | Admitting: Internal Medicine

## 2020-07-20 ENCOUNTER — Inpatient Hospital Stay: Payer: Medicare HMO | Attending: Internal Medicine

## 2020-07-20 ENCOUNTER — Encounter: Payer: Self-pay | Admitting: Internal Medicine

## 2020-07-20 VITALS — BP 150/66 | HR 69 | Temp 98.0°F | Resp 18

## 2020-07-20 VITALS — BP 151/57 | HR 73 | Temp 99.9°F | Resp 16 | Ht 61.0 in | Wt 118.0 lb

## 2020-07-20 DIAGNOSIS — R634 Abnormal weight loss: Secondary | ICD-10-CM | POA: Diagnosis not present

## 2020-07-20 DIAGNOSIS — D509 Iron deficiency anemia, unspecified: Secondary | ICD-10-CM | POA: Diagnosis not present

## 2020-07-20 DIAGNOSIS — E611 Iron deficiency: Secondary | ICD-10-CM | POA: Diagnosis not present

## 2020-07-20 LAB — CBC WITH DIFFERENTIAL/PLATELET
Abs Immature Granulocytes: 0.07 10*3/uL (ref 0.00–0.07)
Basophils Absolute: 0.1 10*3/uL (ref 0.0–0.1)
Basophils Relative: 0 %
Eosinophils Absolute: 0.8 10*3/uL — ABNORMAL HIGH (ref 0.0–0.5)
Eosinophils Relative: 5 %
HCT: 38.8 % (ref 36.0–46.0)
Hemoglobin: 13 g/dL (ref 12.0–15.0)
Immature Granulocytes: 1 %
Lymphocytes Relative: 18 %
Lymphs Abs: 2.6 10*3/uL (ref 0.7–4.0)
MCH: 27.1 pg (ref 26.0–34.0)
MCHC: 33.5 g/dL (ref 30.0–36.0)
MCV: 81 fL (ref 80.0–100.0)
Monocytes Absolute: 0.9 10*3/uL (ref 0.1–1.0)
Monocytes Relative: 6 %
Neutro Abs: 9.7 10*3/uL — ABNORMAL HIGH (ref 1.7–7.7)
Neutrophils Relative %: 70 %
Platelets: 313 10*3/uL (ref 150–400)
RBC: 4.79 MIL/uL (ref 3.87–5.11)
RDW: 17.9 % — ABNORMAL HIGH (ref 11.5–15.5)
WBC: 14 10*3/uL — ABNORMAL HIGH (ref 4.0–10.5)
nRBC: 0 % (ref 0.0–0.2)

## 2020-07-20 LAB — BASIC METABOLIC PANEL
Anion gap: 13 (ref 5–15)
BUN: 17 mg/dL (ref 8–23)
CO2: 24 mmol/L (ref 22–32)
Calcium: 8.8 mg/dL — ABNORMAL LOW (ref 8.9–10.3)
Chloride: 106 mmol/L (ref 98–111)
Creatinine, Ser: 0.79 mg/dL (ref 0.44–1.00)
GFR calc Af Amer: >60 mL/min (ref >60)
GFR calc non Af Amer: >60 mL/min (ref >60)
Glucose, Bld: 122 mg/dL — ABNORMAL HIGH (ref 70–99)
Potassium: 3.9 mmol/L (ref 3.5–5.1)
Sodium: 143 mmol/L (ref 135–145)

## 2020-07-20 MED ORDER — SODIUM CHLORIDE 0.9 % IV SOLN: 200.0000 mg | Freq: Once | INTRAVENOUS | Status: DC

## 2020-07-20 MED ORDER — SODIUM CHLORIDE 0.9 % IV SOLN
Freq: Once | INTRAVENOUS | Status: AC
  Filled 2020-07-20: qty 250

## 2020-07-20 MED ORDER — IRON SUCROSE 20 MG/ML IV SOLN
200.0000 mg | Freq: Once | INTRAVENOUS | Status: AC
  Administered 2020-07-20: 200 mg via INTRAVENOUS
  Filled 2020-07-20: qty 10

## 2020-07-20 NOTE — Progress Notes (Signed)
[place] CONSULT NOTE  Patient Care Team: Einar Pheasant, MD as PCP - General (Internal Medicine) Wellington Hampshire, MD as PCP - Cardiology (Cardiology)  CHIEF COMPLAINTS/PURPOSE OF CONSULTATION:    HEMATOLOGY HISTORY  #Anemia iron deficiency [May 2021-hemoglobin 8-9; ferritin 8; saturation 4%]; p.o. iron; colonoscopy-poor prep not done- 2015; last [facility]-2010; EGD none; Dr.Tahiliani; DECLINES colonoscopy [sec to intol Prep]   HISTORY OF PRESENTING ILLNESS:  Tanya Cole 86 y.o.  female with iron deficiency is here for follow-up.  In the interim patient was evaluated by GI recommended EGD/colonoscopy.  Patient is intolerant to colon prep declines any further work-up.  Patient status post IV iron infusion.  Noted to have improvement of energy levels.  Not back to baseline yet.   Review of Systems  Constitutional: Positive for malaise/fatigue. Negative for chills, diaphoresis, fever and weight loss.  HENT: Negative for nosebleeds and sore throat.   Eyes: Negative for double vision.  Respiratory: Positive for shortness of breath. Negative for cough, hemoptysis, sputum production and wheezing.   Cardiovascular: Negative for chest pain, palpitations, orthopnea and leg swelling.  Gastrointestinal: Negative for abdominal pain, blood in stool, constipation, diarrhea, heartburn, melena, nausea and vomiting.  Genitourinary: Negative for dysuria, frequency and urgency.  Musculoskeletal: Negative for back pain and joint pain.  Skin: Negative.  Negative for itching and rash.  Neurological: Negative for dizziness, tingling, focal weakness, weakness and headaches.  Endo/Heme/Allergies: Does not bruise/bleed easily.  Psychiatric/Behavioral: Negative for depression. The patient is not nervous/anxious and does not have insomnia.     MEDICAL HISTORY:  Past Medical History:  Diagnosis Date  . Aortic insufficiency    a. TTE 12/19: EF 55-60%, probable HK of the mid apical  anterior septal myocardium, Gr1DD, mild AI, mildly dilated LA  . Asthma   . CAD (coronary artery disease)    a. NSTEMI 12/19; b. LHC 10/08/18: LM minimal luminal irregs, mLAD-1 95% s/p PCI/DES, mLAD-2 60%, LCx mild diffuse disease throughout, RCA minimal luminal irregs  . Diabetes mellitus ([place])   . Hypercholesterolemia   . Hypertension   . Myocardial infarction ([place])   . Osteopenia   . Polymyalgia rheumatica syndrome ([place])   . Reactive airway disease     SURGICAL HISTORY: Past Surgical History:  Procedure Laterality Date  . ABDOMINAL HYSTERECTOMY  1981   prolapse and bleeding, ovaries not removed  . BREAST EXCISIONAL BIOPSY Right   . CHOLECYSTECTOMY N/A 09/02/2019   Procedure: LAPAROSCOPIC CHOLECYSTECTOMY WITH INTRAOPERATIVE CHOLANGIOGRAM;  Surgeon: Olean Ree, MD;  Location: [facility];  Service: General;  Laterality: N/A;  . CORONARY STENT INTERVENTION N/A 10/08/2018   Procedure: CORONARY STENT INTERVENTION;  Surgeon: Wellington Hampshire, MD;  Location: [facility];  Service: Cardiovascular;  Laterality: N/A;  . ENDOSCOPIC RETROGRADE CHOLANGIOPANCREATOGRAPHY (ERCP) WITH PROPOFOL N/A 08/08/2019   Procedure: ENDOSCOPIC RETROGRADE CHOLANGIOPANCREATOGRAPHY (ERCP) WITH PROPOFOL;  Surgeon: Lucilla Lame, MD;  Location: [facility];  Service: Endoscopy;  Laterality: N/A;  . LEFT HEART CATH AND CORONARY ANGIOGRAPHY N/A 10/08/2018   Procedure: LEFT HEART CATH AND CORONARY ANGIOGRAPHY;  Surgeon: Wellington Hampshire, MD;  Location: [facility];  Service: Cardiovascular;  Laterality: N/A;  . UMBILICAL HERNIA REPAIR  7/94    SOCIAL HISTORY: Social History   Socioeconomic History  . Marital status: Widowed    Spouse name: Not on file  . Number of children: 3  . Years of education: Not on file  . Highest education level: Not on file  Occupational History  .  Not on file  Tobacco Use  . Smoking status: Never Smoker  . Smokeless tobacco: Never Used  Vaping Use  . Vaping Use:  Never used  Substance and Sexual Activity  . Alcohol use: No    Alcohol/week: 0.0 standard drinks  . Drug use: No  . Sexual activity: Not Currently  Other Topics Concern  . Not on file  Social History Narrative   No smoking; no alcohol; in [place]; 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    Social Determinants of Health   Financial Resource Strain:   . Difficulty of Paying Living Expenses: Not on file  Food Insecurity:   . Worried About Charity fundraiser in the Last Year: Not on file  . Ran Out of Food in the Last Year: Not on file  Transportation Needs:   . Lack of Transportation (Medical): Not on file  . Lack of Transportation (Non-Medical): Not on file  Physical Activity:   . Days of Exercise per Week: Not on file  . Minutes of Exercise per Session: Not on file  Stress:   . Feeling of Stress : Not on file  Social Connections: Moderately Integrated  . Frequency of Communication with Friends and Family: More than three times a week  . Frequency of Social Gatherings with Friends and Family: More than three times a week  . Attends Religious Services: More than 4 times per year  . Active Member of Clubs or Organizations: Yes  . Attends Archivist Meetings: Not on file  . Marital Status: Widowed  Intimate Partner Violence: Not At Risk  . Fear of Current or Ex-Partner: No  . Emotionally Abused: No  . Physically Abused: No  . Sexually Abused: No    FAMILY HISTORY: Family History  Problem Relation Age of Onset  . Heart attack Father   . Arthritis Mother   . Heart disease Mother   . Throat cancer Sister   . Parkinson's disease Sister   . COPD Brother     ALLERGIES:  is allergic to tramadol.  MEDICATIONS:  Current Outpatient Medications  Medication Sig Dispense Refill  . ADVAIR DISKUS 250-50 MCG/DOSE AEPB INHALE ONE PUFF BY MOUTH EVERY 12 HOURS. RINSE MOUTH AFTER EACH USE (Patient taking differently: Inhale 1 puff into the lungs 2 (two) times daily. ) 60 each  5  . albuterol (PROVENTIL) (2.5 MG/3ML) 0.083% nebulizer solution Take 3 mLs (2.5 mg total) by nebulization every 4 (four) hours as needed for wheezing or shortness of breath. 75 mL 1  . alendronate (FOSAMAX) 70 MG tablet Take 70 mg by mouth once a week.     Marland Kitchen aspirin 81 MG tablet Take 81 mg by mouth daily.    Marland Kitchen atorvastatin (LIPITOR) 80 MG tablet Take 1 tablet (80 mg total) by mouth daily at 6 PM. 90 tablet 3  . cyanocobalamin (,VITAMIN B-12,) 1000 MCG/ML injection Inject into the muscle every 30 (thirty) days.    . ferrous sulfate 325 (65 FE) MG tablet Take 325 mg by mouth daily with breakfast.    . fluticasone (FLONASE) 50 MCG/ACT nasal spray Use 2 spray(s) in each nostril once daily 16 g 0  . ibuprofen (ADVIL) 400 MG tablet Take 1 tablet (400 mg total) by mouth every 8 (eight) hours as needed for mild pain or moderate pain. 30 tablet 0  . lisinopril (ZESTRIL) 20 MG tablet Take 1 tablet by mouth once daily 90 tablet 1  . metFORMIN (GLUCOPHAGE) 500 MG tablet TAKE 1  TABLET BY MOUTH TWICE DAILY WITH A MEAL 180 tablet 0  . metoprolol tartrate (LOPRESSOR) 25 MG tablet Take 1 tablet (25 mg total) by mouth 2 (two) times daily. 90 tablet 1  . Multiple Vitamins-Minerals (VISION FORMULA/LUTEIN PO) Take by mouth daily.    . Nebulizers (COMPRESSOR/NEBULIZER) MISC 1 Units by Does not apply route as directed. 1 each 0  . nitroGLYCERIN (NITROSTAT) 0.4 MG SL tablet Place 1 tablet (0.4 mg total) under the tongue every 5 (five) minutes as needed for chest pain. 30 tablet 12  . PROAIR HFA 108 (90 Base) MCG/ACT inhaler Inhale 2 puffs into the lungs every 4 (four) hours as needed for wheezing or shortness of breath. 18 g 1  . Sod Picosulfate-Mag Ox-Cit Acd (CLENPIQ) 10-3.5-12 MG-GM -GM/160ML SOLN Take 1 bottle at 5 PM followed by five 8 oz cups of water and repeat 5 hours before procedure. 320 mL 0  . Sodium Sulfate-Mag Sulfate-KCl (SUTAB) [phone] MG TABS At 5 PM take 12 tablets using the 8 oz cup provided in  the kit drinking 5 cups of water and 5 hours before your procedure repeat the same process. 24 tablet 0  . Vitamin D, Ergocalciferol, (DRISDOL) 50000 units CAPS capsule Take 50,000 Units by mouth once a week.  3   No current facility-administered medications for this visit.      PHYSICAL EXAMINATION:   Vitals:   07/20/20 1313  BP: (!) 151/57  Pulse: 73  Resp: 16  Temp: 99.9 F (37.7 C)  SpO2: 97%   Filed Weights   07/20/20 1313  Weight: 118 lb (53.5 kg)    Physical Exam Constitutional:      Comments: Elderly Caucasian female patient.  Resting comfortably.  Accompanied with family.  HENT:     Head: Normocephalic and atraumatic.     Mouth/Throat:     Pharynx: No oropharyngeal exudate.  Eyes:     Pupils: Pupils are equal, round, and reactive to light.  Cardiovascular:     Rate and Rhythm: Normal rate and regular rhythm.  Pulmonary:     Effort: Pulmonary effort is normal. No respiratory distress.     Breath sounds: Normal breath sounds. No wheezing.  Abdominal:     General: Bowel sounds are normal. There is no distension.     Palpations: Abdomen is soft. There is no mass.     Tenderness: There is no abdominal tenderness. There is no guarding or rebound.  Musculoskeletal:        General: No tenderness. Normal range of motion.     Cervical back: Normal range of motion and neck supple.  Skin:    General: Skin is warm.  Neurological:     Mental Status: She is alert and oriented to person, place, and time.  Psychiatric:        Mood and Affect: Affect normal.     LABORATORY DATA:  I have reviewed the data as listed Lab Results  Component Value Date   WBC 14.0 (H) 07/20/2020   HGB 13.0 07/20/2020   HCT 38.8 07/20/2020   MCV 81.0 07/20/2020   PLT 313 07/20/2020   Recent Labs    08/04/19 2132 08/06/19 0518 09/17/19 1504 09/17/19 1504 03/23/20 0912 03/23/20 0912 04/06/20 1823 04/06/20 1823 05/11/20 1248 07/03/20 1139 07/20/20 1305  NA 142   < > 140   <  > 142   < > 139   < > 140 139 143  K 3.5   < > 4.7   < >  4.2   < > 4.5   < > 4.0 4.3 3.9  CL 106   < > 102   < > 106   < > 106   < > 103 107 106  CO2 22   < > 26   < > 26   < > 23   < > '25 21 24  ' GLUCOSE 118*   < > 97   < > 164*   < > 160*   < > 128* 80 122*  BUN 18   < > 22   < > 12   < > 13   < > '17 21 17  ' CREATININE 0.53   < > 0.65   < > 0.72   < > 0.71   < > 0.75 0.75 0.79  CALCIUM 8.5*   < > 10.2   < > 9.0   < > 9.0   < > 9.8 9.5 8.8*  GFRNONAA >60   < >  --   --   --   --  >60  --  >60  --  >60  GFRAA >60   < >  --   --   --   --  >60  --  >60  --  >60  PROT 7.0  6.7   < > 7.4  --  6.6  --   --   --   --  7.2  --   ALBUMIN 4.1  3.9   < > 4.5  --  4.2  --   --   --   --  4.3  --   AST 433*  424*   < > 13  --  11  --   --   --   --  9  --   ALT 294*  292*   < > 10  --  8  --   --   --   --  9  --   ALKPHOS 317*  312*   < > 82  --  66  --   --   --   --  76  --   BILITOT 2.8*  2.6*   < > 0.5  --  0.4  --   --   --   --  0.4  --   BILIDIR 1.5*  1.6*  --  0.1  --  0.1  --   --   --   --  0.0  --   IBILI 1.3*  1.0*  --   --   --   --   --   --   --   --   --   --    < > = values in this interval not displayed.     No results found.  Iron deficiency #Iron deficiency anemia-hemoglobin 8-9; on p.o. iron.  Hemoglobin 13.  Improving however continues to be symptomatic.  Recommend IV iron infusion.  # Etiology: Iron deficiency unclear; s/p GI evaluation; declined colonoscopy [intol to prep].  Again had a detailed discussion with the patient and friend regarding the importance of identifying the etiology as this could be a malignant process.  As an alternative to colonoscopy would recommend-CT scan abdomen pelvis for further evaluation.  Understands that CT scan would be suboptimal for smaller lesions/leaky blood vessels etc.  However patient is quite adamant not wanting to have a colonoscopy.  # DISPOSITION:will cal with results # Venofer today # CT A/P ASAP # follow  up in 4 months;  MD; labs- cbc/bmp;Venofer; Dr.B   All questions were answered. The patient knows to call the clinic with any problems, questions or concerns.    Cammie Sickle, MD 07/20/2020 2:57 PM

## 2020-07-20 NOTE — Progress Notes (Signed)
Pt tolerated infusion well. No s/s of distress or reaction noted. Pt and VS stable at discharge.  

## 2020-07-20 NOTE — Assessment & Plan Note (Addendum)
#  Iron deficiency anemia-hemoglobin 8-9; on p.o. iron.  Hemoglobin 13.  Improving however continues to be symptomatic.  Recommend IV iron infusion.  # Etiology: Iron deficiency unclear; s/p GI evaluation; declined colonoscopy [intol to prep].  Again had a detailed discussion with the patient and friend regarding the importance of identifying the etiology as this could be a malignant process.  As an alternative to colonoscopy would recommend-CT scan abdomen pelvis for further evaluation.  Understands that CT scan would be suboptimal for smaller lesions/leaky blood vessels etc.  However patient is quite adamant not wanting to have a colonoscopy.  # DISPOSITION:will cal with results # Venofer today # CT A/P ASAP # follow up in 4 months; MD; labs- cbc/bmp;Venofer; Dr.B  

## 2020-07-23 ENCOUNTER — Telehealth: Payer: Self-pay | Admitting: Internal Medicine

## 2020-07-23 NOTE — Telephone Encounter (Signed)
Received notification that pt overdue mammogram.  Need to schedule.

## 2020-07-27 ENCOUNTER — Ambulatory Visit
Admission: RE | Admit: 2020-07-27 | Discharge: 2020-07-27 | Disposition: A | Discharge status: Home / Self Care | Payer: Medicare HMO | Source: Ambulatory Visit | Attending: Internal Medicine | Admitting: Internal Medicine

## 2020-07-27 ENCOUNTER — Other Ambulatory Visit: Payer: Self-pay

## 2020-07-27 DIAGNOSIS — K5730 Diverticulosis of large intestine without perforation or abscess without bleeding: Secondary | ICD-10-CM | POA: Diagnosis not present

## 2020-07-27 DIAGNOSIS — I700 Atherosclerosis of aorta: Secondary | ICD-10-CM | POA: Diagnosis not present

## 2020-07-27 DIAGNOSIS — R634 Abnormal weight loss: Secondary | ICD-10-CM | POA: Diagnosis not present

## 2020-07-27 DIAGNOSIS — E611 Iron deficiency: Secondary | ICD-10-CM | POA: Insufficient documentation

## 2020-07-27 MED ORDER — IOHEXOL 300 MG/ML  SOLN
75.0000 mL | Freq: Once | INTRAMUSCULAR | Status: AC | PRN
  Administered 2020-07-27: 75 mL via INTRAVENOUS

## 2020-07-28 ENCOUNTER — Telehealth: Payer: Self-pay | Admitting: Internal Medicine

## 2020-07-28 NOTE — Telephone Encounter (Signed)
On 9/28-introduced to daughter over the phone; however phone call dropped.  I left a voicemail for the patient's daughter regarding the results of the CT scan-no obvious reason for bleeding noted.  Recommend IV iron infusions as planned/follow-up as planned.

## 2020-07-31 ENCOUNTER — Telehealth: Payer: Self-pay | Admitting: *Deleted

## 2020-07-31 NOTE — Telephone Encounter (Signed)
Patient's friend is not listed on her DPR

## 2020-07-31 NOTE — Telephone Encounter (Signed)
Patient's friend Eduardo Osier called today to request the results of the patient's CT scan. She stated that the patient was anxious to receive results.  Her number is [phone].

## 2020-07-31 NOTE — Telephone Encounter (Signed)
Dr B- FYI 

## 2020-07-31 NOTE — Telephone Encounter (Signed)
I will call friend to let her know she is not listed I would assume that the patient can be called directly.

## 2020-08-03 ENCOUNTER — Telehealth: Payer: Self-pay | Admitting: Internal Medicine

## 2020-08-03 ENCOUNTER — Other Ambulatory Visit: Payer: Self-pay | Admitting: Internal Medicine

## 2020-08-03 NOTE — Telephone Encounter (Signed)
On 10/02-spoke to patient regarding results of the CT scan negative for any acute process/to explain her anemia.  Follow-up as planned.

## 2020-08-04 NOTE — Telephone Encounter (Signed)
Left detailed message letting patient know that she needs to schedule her mammogram

## 2020-08-06 ENCOUNTER — Telehealth: Payer: Self-pay | Admitting: Internal Medicine

## 2020-08-06 NOTE — Telephone Encounter (Signed)
Please triage her. 

## 2020-08-06 NOTE — Telephone Encounter (Signed)
Patient called in stated that her COPD is acting up wanted something called in for her for it .

## 2020-08-06 NOTE — Telephone Encounter (Signed)
Patient stated she is coughing and tingling in her chest. She stated this happens every so often and Dr Lorin Picket gives her prednisone at [facility] on [address]. No fever, chills, chest pain, SOB(only after coughing fit). Patient has taken albuterol/Proair and fluticisalmeter inhalers for sx. She would like a medication called in for her as usual.

## 2020-08-07 ENCOUNTER — Other Ambulatory Visit: Payer: Self-pay

## 2020-08-07 ENCOUNTER — Emergency Department: Payer: Medicare Other

## 2020-08-07 ENCOUNTER — Inpatient Hospital Stay
Admission: EM | Admit: 2020-08-07 | Discharge: 2020-08-10 | DRG: 281 | Disposition: A | Discharge status: Home / Self Care | Payer: Medicare Other | Attending: Internal Medicine | Admitting: Internal Medicine

## 2020-08-07 DIAGNOSIS — Z7982 Long term (current) use of aspirin: Secondary | ICD-10-CM | POA: Diagnosis not present

## 2020-08-07 DIAGNOSIS — I214 Non-ST elevation (NSTEMI) myocardial infarction: Secondary | ICD-10-CM | POA: Diagnosis present

## 2020-08-07 DIAGNOSIS — I080 Rheumatic disorders of both mitral and aortic valves: Secondary | ICD-10-CM | POA: Diagnosis present

## 2020-08-07 DIAGNOSIS — T82855A Stenosis of coronary artery stent, initial encounter: Secondary | ICD-10-CM | POA: Diagnosis not present

## 2020-08-07 DIAGNOSIS — R0602 Shortness of breath: Secondary | ICD-10-CM | POA: Diagnosis present

## 2020-08-07 DIAGNOSIS — Z8249 Family history of ischemic heart disease and other diseases of the circulatory system: Secondary | ICD-10-CM | POA: Diagnosis not present

## 2020-08-07 DIAGNOSIS — Z66 Do not resuscitate: Secondary | ICD-10-CM | POA: Diagnosis not present

## 2020-08-07 DIAGNOSIS — I252 Old myocardial infarction: Secondary | ICD-10-CM | POA: Diagnosis not present

## 2020-08-07 DIAGNOSIS — I25118 Atherosclerotic heart disease of native coronary artery with other forms of angina pectoris: Secondary | ICD-10-CM | POA: Diagnosis present

## 2020-08-07 DIAGNOSIS — I25110 Atherosclerotic heart disease of native coronary artery with unstable angina pectoris: Secondary | ICD-10-CM | POA: Diagnosis present

## 2020-08-07 DIAGNOSIS — Z7984 Long term (current) use of oral hypoglycemic drugs: Secondary | ICD-10-CM

## 2020-08-07 DIAGNOSIS — E1165 Type 2 diabetes mellitus with hyperglycemia: Secondary | ICD-10-CM | POA: Diagnosis not present

## 2020-08-07 DIAGNOSIS — R0689 Other abnormalities of breathing: Secondary | ICD-10-CM | POA: Diagnosis not present

## 2020-08-07 DIAGNOSIS — Z885 Allergy status to narcotic agent status: Secondary | ICD-10-CM

## 2020-08-07 DIAGNOSIS — J45909 Unspecified asthma, uncomplicated: Secondary | ICD-10-CM | POA: Diagnosis present

## 2020-08-07 DIAGNOSIS — Z825 Family history of asthma and other chronic lower respiratory diseases: Secondary | ICD-10-CM | POA: Diagnosis not present

## 2020-08-07 DIAGNOSIS — Z20822 Contact with and (suspected) exposure to covid-19: Secondary | ICD-10-CM | POA: Diagnosis present

## 2020-08-07 DIAGNOSIS — E611 Iron deficiency: Secondary | ICD-10-CM | POA: Diagnosis present

## 2020-08-07 DIAGNOSIS — E1159 Type 2 diabetes mellitus with other circulatory complications: Secondary | ICD-10-CM | POA: Diagnosis present

## 2020-08-07 DIAGNOSIS — Z9049 Acquired absence of other specified parts of digestive tract: Secondary | ICD-10-CM

## 2020-08-07 DIAGNOSIS — Z90710 Acquired absence of both cervix and uterus: Secondary | ICD-10-CM

## 2020-08-07 DIAGNOSIS — Z7951 Long term (current) use of inhaled steroids: Secondary | ICD-10-CM

## 2020-08-07 DIAGNOSIS — Z8261 Family history of arthritis: Secondary | ICD-10-CM | POA: Diagnosis not present

## 2020-08-07 DIAGNOSIS — R54 Age-related physical debility: Secondary | ICD-10-CM | POA: Diagnosis present

## 2020-08-07 DIAGNOSIS — D509 Iron deficiency anemia, unspecified: Secondary | ICD-10-CM | POA: Diagnosis present

## 2020-08-07 DIAGNOSIS — Z808 Family history of malignant neoplasm of other organs or systems: Secondary | ICD-10-CM | POA: Diagnosis not present

## 2020-08-07 DIAGNOSIS — M8580 Other specified disorders of bone density and structure, unspecified site: Secondary | ICD-10-CM | POA: Diagnosis present

## 2020-08-07 DIAGNOSIS — I2510 Atherosclerotic heart disease of native coronary artery without angina pectoris: Secondary | ICD-10-CM | POA: Diagnosis present

## 2020-08-07 DIAGNOSIS — D72829 Elevated white blood cell count, unspecified: Secondary | ICD-10-CM | POA: Diagnosis present

## 2020-08-07 DIAGNOSIS — E7800 Pure hypercholesterolemia, unspecified: Secondary | ICD-10-CM | POA: Diagnosis not present

## 2020-08-07 DIAGNOSIS — Z955 Presence of coronary angioplasty implant and graft: Secondary | ICD-10-CM

## 2020-08-07 DIAGNOSIS — I10 Essential (primary) hypertension: Secondary | ICD-10-CM | POA: Diagnosis not present

## 2020-08-07 DIAGNOSIS — Y712 Prosthetic and other implants, materials and accessory cardiovascular devices associated with adverse incidents: Secondary | ICD-10-CM | POA: Diagnosis present

## 2020-08-07 DIAGNOSIS — I471 Supraventricular tachycardia: Secondary | ICD-10-CM | POA: Diagnosis present

## 2020-08-07 DIAGNOSIS — I200 Unstable angina: Secondary | ICD-10-CM | POA: Diagnosis not present

## 2020-08-07 DIAGNOSIS — R6889 Other general symptoms and signs: Secondary | ICD-10-CM | POA: Diagnosis not present

## 2020-08-07 DIAGNOSIS — J449 Chronic obstructive pulmonary disease, unspecified: Secondary | ICD-10-CM | POA: Diagnosis present

## 2020-08-07 DIAGNOSIS — Z7983 Long term (current) use of bisphosphonates: Secondary | ICD-10-CM

## 2020-08-07 DIAGNOSIS — Z820 Family history of epilepsy and other diseases of the nervous system: Secondary | ICD-10-CM | POA: Diagnosis not present

## 2020-08-07 DIAGNOSIS — R079 Chest pain, unspecified: Secondary | ICD-10-CM | POA: Diagnosis not present

## 2020-08-07 DIAGNOSIS — I340 Nonrheumatic mitral (valve) insufficiency: Secondary | ICD-10-CM | POA: Diagnosis not present

## 2020-08-07 DIAGNOSIS — E119 Type 2 diabetes mellitus without complications: Secondary | ICD-10-CM | POA: Diagnosis present

## 2020-08-07 DIAGNOSIS — R062 Wheezing: Secondary | ICD-10-CM | POA: Diagnosis not present

## 2020-08-07 DIAGNOSIS — E785 Hyperlipidemia, unspecified: Secondary | ICD-10-CM | POA: Diagnosis present

## 2020-08-07 DIAGNOSIS — M353 Polymyalgia rheumatica: Secondary | ICD-10-CM | POA: Diagnosis present

## 2020-08-07 DIAGNOSIS — Z743 Need for continuous supervision: Secondary | ICD-10-CM | POA: Diagnosis not present

## 2020-08-07 DIAGNOSIS — J90 Pleural effusion, not elsewhere classified: Secondary | ICD-10-CM | POA: Diagnosis not present

## 2020-08-07 DIAGNOSIS — Z79899 Other long term (current) drug therapy: Secondary | ICD-10-CM

## 2020-08-07 HISTORY — DX: Palpitations: R00.2

## 2020-08-07 LAB — HEPARIN LEVEL (UNFRACTIONATED): Heparin Unfractionated: <0.1 IU/mL — ABNORMAL LOW (ref 0.30–0.70)

## 2020-08-07 LAB — TROPONIN I (HIGH SENSITIVITY)
Troponin I (High Sensitivity): 195 ng/L (ref <18)
Troponin I (High Sensitivity): 187 ng/L (ref <18)
Troponin I (High Sensitivity): 169 ng/L (ref <18)
Troponin I (High Sensitivity): 138 ng/L (ref <18)

## 2020-08-07 LAB — RESPIRATORY PANEL BY RT PCR (FLU A&B, COVID)
Influenza A by PCR: NEGATIVE
Influenza B by PCR: NEGATIVE
SARS Coronavirus 2 by RT PCR: NEGATIVE

## 2020-08-07 LAB — URINALYSIS, COMPLETE (UACMP) WITH MICROSCOPIC
Bilirubin Urine: NEGATIVE
Glucose, UA: NEGATIVE mg/dL
Hgb urine dipstick: NEGATIVE
Ketones, ur: NEGATIVE mg/dL
Leukocytes,Ua: NEGATIVE
Nitrite: NEGATIVE
Protein, ur: 100 mg/dL — AB
Specific Gravity, Urine: 1.011 (ref 1.005–1.030)
pH: 6 (ref 5.0–8.0)

## 2020-08-07 LAB — CBC WITH DIFFERENTIAL/PLATELET
Abs Immature Granulocytes: 0.04 10*3/uL (ref 0.00–0.07)
Basophils Absolute: 0.1 10*3/uL (ref 0.0–0.1)
Basophils Relative: 1 %
Eosinophils Absolute: 1.3 10*3/uL — ABNORMAL HIGH (ref 0.0–0.5)
Eosinophils Relative: 11 %
HCT: 40.3 % (ref 36.0–46.0)
Hemoglobin: 13.1 g/dL (ref 12.0–15.0)
Immature Granulocytes: 0 %
Lymphocytes Relative: 15 %
Lymphs Abs: 1.9 10*3/uL (ref 0.7–4.0)
MCH: 27.2 pg (ref 26.0–34.0)
MCHC: 32.5 g/dL (ref 30.0–36.0)
MCV: 83.8 fL (ref 80.0–100.0)
Monocytes Absolute: 0.7 10*3/uL (ref 0.1–1.0)
Monocytes Relative: 6 %
Neutro Abs: 8 10*3/uL — ABNORMAL HIGH (ref 1.7–7.7)
Neutrophils Relative %: 67 %
Platelets: 275 10*3/uL (ref 150–400)
RBC: 4.81 MIL/uL (ref 3.87–5.11)
RDW: 16.6 % — ABNORMAL HIGH (ref 11.5–15.5)
WBC: 12 10*3/uL — ABNORMAL HIGH (ref 4.0–10.5)
nRBC: 0 % (ref 0.0–0.2)

## 2020-08-07 LAB — COMPREHENSIVE METABOLIC PANEL
ALT: 12 U/L (ref 0–44)
AST: 19 U/L (ref 15–41)
Albumin: 4.2 g/dL (ref 3.5–5.0)
Alkaline Phosphatase: 64 U/L (ref 38–126)
Anion gap: 12 (ref 5–15)
BUN: 9 mg/dL (ref 8–23)
CO2: 23 mmol/L (ref 22–32)
Calcium: 8.9 mg/dL (ref 8.9–10.3)
Chloride: 105 mmol/L (ref 98–111)
Creatinine, Ser: 0.63 mg/dL (ref 0.44–1.00)
GFR, Estimated: >60 mL/min (ref >60)
Glucose, Bld: 140 mg/dL — ABNORMAL HIGH (ref 70–99)
Potassium: 3.8 mmol/L (ref 3.5–5.1)
Sodium: 140 mmol/L (ref 135–145)
Total Bilirubin: 0.8 mg/dL (ref 0.3–1.2)
Total Protein: 7.3 g/dL (ref 6.5–8.1)

## 2020-08-07 LAB — PROTIME-INR
INR: 1.1 (ref 0.8–1.2)
Prothrombin Time: 13.6 seconds (ref 11.4–15.2)

## 2020-08-07 LAB — LIPASE, BLOOD: Lipase: 23 U/L (ref 11–51)

## 2020-08-07 LAB — GLUCOSE, CAPILLARY
Glucose-Capillary: 89 mg/dL (ref 70–99)
Glucose-Capillary: 116 mg/dL — ABNORMAL HIGH (ref 70–99)

## 2020-08-07 LAB — HEMOGLOBIN A1C
Hgb A1c MFr Bld: 6.7 % — ABNORMAL HIGH (ref 4.8–5.6)
Mean Plasma Glucose: 145.59 mg/dL

## 2020-08-07 LAB — BRAIN NATRIURETIC PEPTIDE: B Natriuretic Peptide: 259.6 pg/mL — ABNORMAL HIGH (ref 0.0–100.0)

## 2020-08-07 LAB — APTT: aPTT: 32 seconds (ref 24–36)

## 2020-08-07 MED ORDER — INSULIN ASPART 100 UNIT/ML ~~LOC~~ SOLN: 0.0000 [IU] | Freq: Every day | SUBCUTANEOUS | Status: DC

## 2020-08-07 MED ORDER — HYDRALAZINE HCL 20 MG/ML IJ SOLN: 5.0000 mg | INTRAMUSCULAR | Status: DC | PRN

## 2020-08-07 MED ORDER — DM-GUAIFENESIN ER 30-600 MG PO TB12
1.0000 | ORAL_TABLET | Freq: Two times a day (BID) | ORAL | Status: DC | PRN
  Administered 2020-08-08 – 2020-08-10 (×4): 1 via ORAL
  Filled 2020-08-07 (×4): qty 1

## 2020-08-07 MED ORDER — LISINOPRIL 20 MG PO TABS
20.0000 mg | ORAL_TABLET | Freq: Every day | ORAL | Status: DC
  Administered 2020-08-08 – 2020-08-10 (×3): 20 mg via ORAL
  Filled 2020-08-07 (×3): qty 1

## 2020-08-07 MED ORDER — FERROUS SULFATE 325 (65 FE) MG PO TABS
325.0000 mg | ORAL_TABLET | Freq: Every day | ORAL | Status: DC
  Administered 2020-08-08 – 2020-08-10 (×3): 325 mg via ORAL
  Filled 2020-08-07 (×2): qty 1

## 2020-08-07 MED ORDER — ASPIRIN EC 81 MG PO TBEC
81.0000 mg | DELAYED_RELEASE_TABLET | Freq: Every day | ORAL | Status: DC
  Administered 2020-08-08 – 2020-08-09 (×2): 81 mg via ORAL
  Filled 2020-08-07 (×2): qty 1

## 2020-08-07 MED ORDER — IPRATROPIUM BROMIDE 0.02 % IN SOLN
2.0000 mL | Freq: Four times a day (QID) | RESPIRATORY_TRACT | Status: DC
  Administered 2020-08-07 – 2020-08-08 (×3): 0.4 mg via RESPIRATORY_TRACT
  Administered 2020-08-08: 0.5 mg via RESPIRATORY_TRACT
  Filled 2020-08-07 (×4): qty 2.5

## 2020-08-07 MED ORDER — ONDANSETRON HCL 4 MG/2ML IJ SOLN
4.0000 mg | Freq: Four times a day (QID) | INTRAMUSCULAR | Status: DC | PRN
  Administered 2020-08-07 – 2020-08-08 (×3): 4 mg via INTRAVENOUS
  Filled 2020-08-07 (×3): qty 2

## 2020-08-07 MED ORDER — LISINOPRIL 20 MG PO TABS: 20.0000 mg | ORAL_TABLET | Freq: Every day | ORAL | Status: DC

## 2020-08-07 MED ORDER — MORPHINE SULFATE (PF) 2 MG/ML IV SOLN: 1.0000 mg | INTRAVENOUS | Status: DC | PRN

## 2020-08-07 MED ORDER — HEPARIN BOLUS VIA INFUSION
3200.0000 [IU] | Freq: Once | INTRAVENOUS | Status: AC
  Administered 2020-08-07: 3200 [IU] via INTRAVENOUS
  Filled 2020-08-07: qty 3200

## 2020-08-07 MED ORDER — METOPROLOL TARTRATE 25 MG PO TABS
25.0000 mg | ORAL_TABLET | Freq: Two times a day (BID) | ORAL | Status: DC
  Filled 2020-08-07: qty 1

## 2020-08-07 MED ORDER — HEPARIN (PORCINE) 25000 UT/250ML-% IV SOLN
1150.0000 [IU]/h | INTRAVENOUS | Status: DC
  Administered 2020-08-07: 650 [IU]/h via INTRAVENOUS
  Administered 2020-08-08: 950 [IU]/h via INTRAVENOUS
  Administered 2020-08-09: 1050 [IU]/h via INTRAVENOUS
  Filled 2020-08-07 (×3): qty 250

## 2020-08-07 MED ORDER — NITROGLYCERIN 0.4 MG SL SUBL: 0.4000 mg | SUBLINGUAL_TABLET | SUBLINGUAL | Status: DC | PRN

## 2020-08-07 MED ORDER — METOPROLOL TARTRATE 25 MG PO TABS
25.0000 mg | ORAL_TABLET | Freq: Two times a day (BID) | ORAL | Status: DC
  Administered 2020-08-07 – 2020-08-10 (×6): 25 mg via ORAL
  Filled 2020-08-07 (×6): qty 1

## 2020-08-07 MED ORDER — ONDANSETRON HCL 4 MG/2ML IJ SOLN
4.0000 mg | Freq: Once | INTRAMUSCULAR | Status: AC
  Administered 2020-08-07: 4 mg via INTRAVENOUS
  Filled 2020-08-07: qty 2

## 2020-08-07 MED ORDER — NITROGLYCERIN 2 % TD OINT
0.5000 [in_us] | TOPICAL_OINTMENT | Freq: Four times a day (QID) | TRANSDERMAL | Status: DC
  Administered 2020-08-07 – 2020-08-08 (×4): 0.5 [in_us] via TOPICAL
  Filled 2020-08-07 (×4): qty 1

## 2020-08-07 MED ORDER — OCUVITE-LUTEIN PO CAPS
1.0000 | ORAL_CAPSULE | Freq: Every day | ORAL | Status: DC
  Administered 2020-08-08 – 2020-08-10 (×3): 1 via ORAL
  Filled 2020-08-07 (×3): qty 1

## 2020-08-07 MED ORDER — ASPIRIN 81 MG PO CHEW
324.0000 mg | CHEWABLE_TABLET | Freq: Once | ORAL | Status: AC
  Administered 2020-08-07: 324 mg via ORAL
  Filled 2020-08-07: qty 4

## 2020-08-07 MED ORDER — SODIUM CHLORIDE 0.9 % IV BOLUS
500.0000 mL | Freq: Once | INTRAVENOUS | Status: AC
  Administered 2020-08-07: 500 mL via INTRAVENOUS

## 2020-08-07 MED ORDER — ACETAMINOPHEN 325 MG PO TABS
650.0000 mg | ORAL_TABLET | Freq: Four times a day (QID) | ORAL | Status: DC | PRN
  Administered 2020-08-09 (×2): 650 mg via ORAL
  Filled 2020-08-07 (×2): qty 2

## 2020-08-07 MED ORDER — ATORVASTATIN CALCIUM 80 MG PO TABS: 80.0000 mg | ORAL_TABLET | Freq: Every day | ORAL | Status: DC

## 2020-08-07 MED ORDER — INSULIN ASPART 100 UNIT/ML ~~LOC~~ SOLN
0.0000 [IU] | Freq: Three times a day (TID) | SUBCUTANEOUS | Status: DC
  Administered 2020-08-08 – 2020-08-10 (×5): 1 [IU] via SUBCUTANEOUS
  Filled 2020-08-07 (×5): qty 1

## 2020-08-07 MED ORDER — ALBUTEROL SULFATE (2.5 MG/3ML) 0.083% IN NEBU
3.0000 mL | INHALATION_SOLUTION | RESPIRATORY_TRACT | Status: DC | PRN
  Administered 2020-08-07 – 2020-08-10 (×3): 3 mL via RESPIRATORY_TRACT
  Filled 2020-08-07 (×4): qty 3

## 2020-08-07 MED ORDER — ATORVASTATIN CALCIUM 80 MG PO TABS
80.0000 mg | ORAL_TABLET | Freq: Every day | ORAL | Status: DC
  Administered 2020-08-07 – 2020-08-09 (×3): 80 mg via ORAL
  Filled 2020-08-07: qty 4
  Filled 2020-08-07 (×3): qty 1

## 2020-08-07 MED ORDER — ACETAMINOPHEN 500 MG PO TABS
1000.0000 mg | ORAL_TABLET | Freq: Once | ORAL | Status: AC
  Administered 2020-08-07: 1000 mg via ORAL
  Filled 2020-08-07: qty 2

## 2020-08-07 MED ORDER — HEPARIN BOLUS VIA INFUSION
1600.0000 [IU] | Freq: Once | INTRAVENOUS | Status: AC
  Administered 2020-08-07: 1600 [IU] via INTRAVENOUS
  Filled 2020-08-07: qty 1600

## 2020-08-07 MED ORDER — FLUTICASONE PROPIONATE 50 MCG/ACT NA SUSP
1.0000 | Freq: Every day | NASAL | Status: DC | PRN
  Filled 2020-08-07 (×2): qty 16

## 2020-08-07 NOTE — Telephone Encounter (Signed)
With symptoms, can she do a virtual visit (at least).  If any acute symptoms, needs to be evaluated in person.

## 2020-08-07 NOTE — Progress Notes (Signed)
ANTICOAGULATION CONSULT NOTE - Initial Consult  Pharmacy Consult for Heparin Indication: chest pain/ACS  Allergies  Allergen Reactions  . Tramadol Itching and Nausea And Vomiting    Patient Measurements: Height: 5\' 1"  (154.9 cm) Weight: 52.5 kg (115 lb 12.8 oz) IBW/kg (Calculated) : 47.8 Heparin Dosing Weight: 54 kg  Vital Signs: Temp: 98.6 F (37 C) (10/08 2117) Temp Source: Oral (10/08 2117) BP: 152/77 (10/08 2117) Pulse Rate: 67 (10/08 2117)  Labs: Recent Labs    08/07/20 1135 08/07/20 1135 08/07/20 1337 08/07/20 1619 08/07/20 1903 08/07/20 2139  HGB 13.1  --   --   --   --   --   HCT 40.3  --   --   --   --   --   PLT 275  --   --   --   --   --   APTT 32  --   --   --   --   --   LABPROT 13.6  --   --   --   --   --   INR 1.1  --   --   --   --   --   HEPARINUNFRC  --   --   --   --   --  <0.10*  CREATININE 0.63  --   --   --   --   --   TROPONINIHS 195*   < > 187* 169* 138*  --    < > = values in this interval not displayed.    Estimated Creatinine Clearance: 38.8 mL/min (by C-G formula based on SCr of 0.63 mg/dL).   Medical History: Past Medical History:  Diagnosis Date  . Aortic insufficiency    a. TTE 12/19: EF 55-60%, probable HK of the mid apical anterior septal myocardium, Gr1DD, mild AI, mildly dilated LA  . Asthma   . CAD (coronary artery disease)    a. NSTEMI 12/19; b. LHC 10/08/18: LM minimal luminal irregs, mLAD-1 95% s/p PCI/DES, mLAD-2 60%, LCx mild diffuse disease throughout, RCA minimal luminal irregs; b. 03/2020 MV: EF>65%, no ischemia/scar.  . Diabetes mellitus ([facility])   . Hypercholesterolemia   . Hypertension   . Myocardial infarction ([facility])   . Osteopenia   . Palpitations    a. 04/2020 [facility]: Avg HR 75. 429 SVT episodes, longest 19 secs @ 133. Occas PACs (3.2%). Rare PVCs (<1%).  . Polymyalgia rheumatica syndrome ([facility])   . Reactive airway disease       Assessment: 86 yo female here with elevated troponin to start on Heparin drip  for NSTEMI. No oral anticoagulants noted on PTA med list.   Goal of Therapy:  Heparin level 0.3-0.7 units/ml Monitor platelets by anticoagulation protocol: Yes   Plan:  APTT and INR being added on to labs - INR 1.1, aPTT 32 Heparin bolus 3200 units IV x1 then heparin drip at 650 units/hr HL 8h after start of drip, CBC in AM  10/8:  HL @ 2139 = < 0.1  Will order Heparin 1600 units IV X 1 bolus and increase drip rate to 850 units/hr.  Will recheck HL 8 hrs after rate change.   Pharmacy will continue to follow.   Natelie Ostrosky D 08/07/2020,10:53 PM

## 2020-08-07 NOTE — ED Notes (Signed)
Daughter at bedside, stretcher locked in lowest position, call bell within reach

## 2020-08-07 NOTE — ED Triage Notes (Signed)
Pt to ED via [facility] from home for sudden SHOB, hx COPD HTN EMS reports RA SpO2 89%, 2 duonebs given, placedon 8L [place] for SpO2 of 89%, improved to 93% on 2L.  Given 125 solumedrol.  20G in Left FA.   Pt 94 % on RA at this time, does not wear O2 at home. Speaking in complete sentences. Denies CP at this time. Reports nausea.  RR unlabored.  Alert and oriented .

## 2020-08-07 NOTE — Progress Notes (Addendum)
Pt is complaining of nausea but no PRN orer on MAR. Notify NP Jon Billings. Will continue to monitor.  Update 2154: NP Jon Billings ordered Zofran every 6 hours PRN. Will continue to monitor.

## 2020-08-07 NOTE — ED Notes (Signed)
Pt assisted to toilet 

## 2020-08-07 NOTE — Consult Note (Addendum)
Cardiology Consult    [name] MRN: [id number], DOB/AGE: 09-Oct-1933   Admit date: 08/07/2020 Date of Consult: 08/07/2020  Primary Physician: Einar Pheasant, MD Primary Cardiologist: Kathlyn Sacramento, MD Requesting Provider: Mora Bellman, MD  Patient Profile    Tanya Cole is a 86 y.o. female with a history of CAD s/p NSTEMI in 09/2018 s/p LAD DES, hypertension, hyperlipidemia, diabetes, palpitations with short runs of PSVT on recent monitoring, diastolic dysfunction, and mild aortic insufficiency, who is being seen today for the evaluation of chest pain with mild troponin elevation at the request of Dr. Blaine Hamper.  Past Medical History   Past Medical History:  Diagnosis Date  . Aortic insufficiency    a. TTE 12/19: EF 55-60%, probable HK of the mid apical anterior septal myocardium, Gr1DD, mild AI, mildly dilated LA  . Asthma   . CAD (coronary artery disease)    a. NSTEMI 12/19; b. LHC 10/08/18: LM minimal luminal irregs, mLAD-1 95% s/p PCI/DES, mLAD-2 60%, LCx mild diffuse disease throughout, RCA minimal luminal irregs; b. 03/2020 MV: EF>65%, no ischemia/scar.  . Diabetes mellitus ([place])   . Hypercholesterolemia   . Hypertension   . Myocardial infarction ([place])   . Osteopenia   . Palpitations    a. 04/2020 Zio: Avg HR 75. 429 SVT episodes, longest 19 secs @ 133. Occas PACs (3.2%). Rare PVCs (<1%).  . Polymyalgia rheumatica syndrome ([place])   . Reactive airway disease     Past Surgical History:  Procedure Laterality Date  . ABDOMINAL HYSTERECTOMY  1981   prolapse and bleeding, ovaries not removed  . BREAST EXCISIONAL BIOPSY Right   . CHOLECYSTECTOMY N/A 09/02/2019   Procedure: LAPAROSCOPIC CHOLECYSTECTOMY WITH INTRAOPERATIVE CHOLANGIOGRAM;  Surgeon: Olean Ree, MD;  Location: [facility] ORS;  Service: General;  Laterality: N/A;  . CORONARY STENT INTERVENTION N/A 10/08/2018   Procedure: CORONARY STENT INTERVENTION;  Surgeon: Wellington Hampshire, MD;  Location: [facility];  Service: Cardiovascular;  Laterality: N/A;  . ENDOSCOPIC RETROGRADE CHOLANGIOPANCREATOGRAPHY (ERCP) WITH PROPOFOL N/A 08/08/2019   Procedure: ENDOSCOPIC RETROGRADE CHOLANGIOPANCREATOGRAPHY (ERCP) WITH PROPOFOL;  Surgeon: Lucilla Lame, MD;  Location: [facility] END[facility];  Service: Endoscopy;  Laterality: N/A;  . LEFT HEART CATH AND CORONARY ANGIOGRAPHY N/A 10/08/2018   Procedure: LEFT HEART CATH AND CORONARY ANGIOGRAPHY;  Surgeon: Wellington Hampshire, MD;  Location: [place] [facility];  Service: Cardiovascular;  Laterality: N/A;  . UMBILICAL HERNIA REPAIR  7/94     Allergies  Allergies  Allergen Reactions  . Tramadol Itching and Nausea And Vomiting    History of Present Illness    86 year old female with the above past medical history including coronary artery disease, hypertension, hyperlipidemia, diabetes, palpitations, PSVT, diastolic dysfunction, and mild aortic insufficiency.  She suffered a non-ST segment elevation myocardial infarction in December 2019 and underwent diagnostic catheterization revealing a 95% mid LAD stenosis along with a 6% distal LAD stenosis.  The mid LAD was successfully treated with a drug-eluting stent.  Echocardiogram showed an EF of 55 to 60%.  In February 2020, she was admitted with atypical chest pain and normal troponins.  Lexiscan Myoview at that time was nonischemic.  More recently, she was evaluated in cardiology clinic in early June of this year with complaints of indigestion and increasing dyspnea on exertion.  Stress testing was nonischemic and showed normal LV function.  There was report of possible A. fib during stress testing and as result, patient wore a Zio monitor which did not show any  A. fib.  She did have multiple short runs of PSVT, however.  At her last clinic visit in July, she was doing well.  She has been receiving intravenous iron in the setting of iron deficiency anemia in that setting, felt fairly well throughout the summer.  She  lives locally by herself and does all of her own housework.  She was in her usual state of health until yesterday morning, when she awoke feeling very sweaty and mildly dyspneic.  She says that dyspnea persisted throughout the day she just did not feel right.  She was not having chest pain at the time.  This morning, at approximately 8 AM, she began to experience up to 8/10 substernal chest heaviness associated with dyspnea.  She notified her daughter at approximately 9:45 AM and then hit her life alert button.  She was seen by EMS and then taken to the [facility] ED where ECG showed no acute ST or T changes.  Chest pain resolved shortly after ED arrival without intervention.  High-sensitivity troponin returned elevated at 195.  She is currently chest pain-free.  Follow-up troponin is slightly lower at 187.  Covid testing is negative.  Inpatient Medications    . insulin aspart  0-5 Units Subcutaneous QHS  . insulin aspart  0-9 Units Subcutaneous TID WC  . ipratropium  2 mL Inhalation Q6H   Home Medications    Prior to Admission medications   Medication Sig Start Date End Date Taking? Authorizing Provider  alendronate (FOSAMAX) 70 MG tablet Take 70 mg by mouth once a week.  11/06/17  Yes [provider]  aspirin 81 MG tablet Take 81 mg by mouth daily.   Yes [provider]  atorvastatin (LIPITOR) 80 MG tablet Take 1 tablet (80 mg total) by mouth daily at 6 PM. 04/01/20  Yes Loel Dubonnet, NP  ferrous sulfate 325 (65 FE) MG tablet Take 325 mg by mouth daily with breakfast.   Yes [provider]  lisinopril (ZESTRIL) 20 MG tablet Take 1 tablet by mouth once daily 06/11/20  Yes Dunn, Ryan M, PA-C  metFORMIN (GLUCOPHAGE) 500 MG tablet TAKE 1 TABLET BY MOUTH TWICE DAILY WITH A MEAL 05/05/20  Yes Einar Pheasant, MD  metoprolol tartrate (LOPRESSOR) 25 MG tablet Take 1 tablet (25 mg total) by mouth 2 (two) times daily. 06/08/20  Yes Loel Dubonnet, NP  Multiple Vitamins-Minerals  (VISION FORMULA/LUTEIN PO) Take 1 tablet by mouth daily.    Yes [provider]  Vitamin D, Ergocalciferol, (DRISDOL) 50000 units CAPS capsule Take 50,000 Units by mouth once a week. 06/24/18  Yes [provider]  ADVAIR DISKUS 250-50 MCG/DOSE AEPB INHALE ONE PUFF BY MOUTH EVERY 12 HOURS. RINSE MOUTH AFTER EACH USE Patient not taking: Reported on 08/07/2020 02/17/16   Einar Pheasant, MD  albuterol (PROVENTIL) (2.5 MG/3ML) 0.083% nebulizer solution Take 3 mLs (2.5 mg total) by nebulization every 4 (four) hours as needed for wheezing or shortness of breath. 06/08/20   Einar Pheasant, MD  fluticasone (FLONASE) 50 MCG/ACT nasal spray Use 2 spray(s) in each nostril once daily 08/03/20   Einar Pheasant, MD  ibuprofen (ADVIL) 400 MG tablet Take 1 tablet (400 mg total) by mouth every 8 (eight) hours as needed for mild pain or moderate pain. Patient not taking: Reported on 08/07/2020 09/02/19   Olean Ree, MD  Nebulizers (COMPRESSOR/NEBULIZER) MISC 1 Units by Does not apply route as directed. 04/07/20   Arta Silence, MD  nitroGLYCERIN (NITROSTAT) 0.4 MG SL  tablet Place 1 tablet (0.4 mg total) under the tongue every 5 (five) minutes as needed for chest pain. 10/09/18   Bettey Costa, MD  PROAIR HFA 108 (90 Base) MCG/ACT inhaler Inhale 2 puffs into the lungs every 4 (four) hours as needed for wheezing or shortness of breath. 06/05/20   Einar Pheasant, MD  Sod Picosulfate-Mag Ox-Cit Acd (CLENPIQ) 10-3.5-12 MG-GM -GM/160ML SOLN Take 1 bottle at 5 PM followed by five 8 oz cups of water and repeat 5 hours before procedure. Patient not taking: Reported on 08/07/2020 06/16/20   Virgel Manifold, MD  Sodium Sulfate-Mag Sulfate-KCl (SUTAB) [phone] MG TABS At 5 PM take 12 tablets using the 8 oz cup provided in the kit drinking 5 cups of water and 5 hours before your procedure repeat the same process. Patient not taking: Reported on 08/07/2020 05/19/20   Virgel Manifold, MD     Family History     Family History  Problem Relation Age of Onset  . Heart attack Father   . Arthritis Mother   . Heart disease Mother   . Throat cancer Sister   . Parkinson's disease Sister   . COPD Brother    She indicated that the status of her mother is unknown. She indicated that the status of her father is unknown. She indicated that her sister is alive. She indicated that her brother is alive.   Social History    Social History   Socioeconomic History  . Marital status: Widowed    Spouse name: Not on file  . Number of children: 3  . Years of education: Not on file  . Highest education level: Not on file  Occupational History  . Not on file  Tobacco Use  . Smoking status: Never Smoker  . Smokeless tobacco: Never Used  Vaping Use  . Vaping Use: Never used  Substance and Sexual Activity  . Alcohol use: No    Alcohol/week: 0.0 standard drinks  . Drug use: No  . Sexual activity: Not Currently  Other Topics Concern  . Not on file  Social History Narrative   No smoking; no alcohol; in [place]; worked in Charity fundraiser. Lives by self in [place]. Does all of her own housework. Dtr does food shopping for her.   Social Determinants of Health   Financial Resource Strain:   . Difficulty of Paying Living Expenses: Not on file  Food Insecurity:   . Worried About Charity fundraiser in the Last Year: Not on file  . Ran Out of Food in the Last Year: Not on file  Transportation Needs:   . Lack of Transportation (Medical): Not on file  . Lack of Transportation (Non-Medical): Not on file  Physical Activity:   . Days of Exercise per Week: Not on file  . Minutes of Exercise per Session: Not on file  Stress:   . Feeling of Stress : Not on file  Social Connections: Moderately Integrated  . Frequency of Communication with Friends and Family: More than three times a week  . Frequency of Social Gatherings with Friends and Family: More than three times a week  . Attends Religious Services: More  than 4 times per year  . Active Member of Clubs or Organizations: Yes  . Attends Archivist Meetings: Not on file  . Marital Status: Widowed  Intimate Partner Violence: Not At Risk  . Fear of Current or Ex-Partner: No  . Emotionally Abused: No  . Physically Abused: No  . Sexually  Abused: No     Review of Systems    General:  No chills, fever, night sweats or weight changes.  Cardiovascular:  +++ chest pain, +++ dyspnea on exertion, no edema, orthopnea, palpitations, paroxysmal nocturnal dyspnea. Dermatological: No rash, lesions/masses Respiratory: No cough, +++ dyspnea Urologic: No hematuria, dysuria Abdominal:   No nausea, vomiting, diarrhea, bright red blood per rectum, melena, or hematemesis Neurologic:  No visual changes, wkns, changes in mental status. All other systems reviewed and are otherwise negative except as noted above.  Physical Exam    Blood pressure (!) 131/52, pulse 67, temperature 98.8 F (37.1 C), temperature source Oral, resp. rate (!) 21, height _0  (1.549 m), weight 54 kg, SpO2 96 %.  General: Pleasant, NAD Psych: Normal affect. Neuro: Alert and oriented X 3. Moves all extremities spontaneously. HEENT: Normal  Neck: Supple without bruits or JVD. Lungs:  Resp regular and unlabored, CTA. Heart: RRR no s3, s4, or murmurs. Abdomen: Soft, non-tender, non-distended, BS + x 4.  Extremities: No clubbing, cyanosis or edema. DP/PT/Radials 1+ and equal bilaterally.  Labs    Cardiac Enzymes Recent Labs  Lab 08/07/20 1135 08/07/20 1337  TROPONINIHS 195* 187*      Lab Results  Component Value Date   WBC 12.0 (H) 08/07/2020   HGB 13.1 08/07/2020   HCT 40.3 08/07/2020   MCV 83.8 08/07/2020   PLT 275 08/07/2020    Recent Labs  Lab 08/07/20 1135  NA 140  K 3.8  CL 105  CO2 23  BUN 9  CREATININE 0.63  CALCIUM 8.9  PROT 7.3  BILITOT 0.8  ALKPHOS 64  ALT 12  AST 19  GLUCOSE 140*   Lab Results  Component Value Date   CHOL 192  07/03/2020   HDL 45.50 07/03/2020   LDLCALC 90 03/23/2020   TRIG 225.0 (H) 07/03/2020    Radiology Studies    CT Abdomen Pelvis W Contrast  Result Date: 07/27/2020 CLINICAL DATA:  Unintended weight loss, persistent iron deficiency EXAM: CT ABDOMEN AND PELVIS WITH CONTRAST TECHNIQUE: Multidetector CT imaging of the abdomen and pelvis was performed using the standard protocol following bolus administration of intravenous contrast. CONTRAST:  81m OMNIPAQUE IOHEXOL 300 MG/ML SOLN, additional oral enteric contrast COMPARISON:  10/06/2016 FINDINGS: Lower chest: No acute abnormality. Hepatobiliary: No focal liver abnormality is seen. Status post cholecystectomy. No biliary dilatation. Pancreas: Unremarkable. No pancreatic ductal dilatation or surrounding inflammatory changes. Spleen: Normal in size without significant abnormality. Adrenals/Urinary Tract: Adrenal glands are unremarkable. Kidneys are normal, without renal calculi, solid lesion, or hydronephrosis. Bladder is unremarkable. Stomach/Bowel: Stomach is within normal limits. Appendix appears normal. No evidence of bowel wall thickening, distention, or inflammatory changes. Severe pancolonic diverticulosis, worst in the sigmoid. Vascular/Lymphatic: Aortic atherosclerosis. No enlarged abdominal or pelvic lymph nodes. Reproductive: Status post cholecystectomy. Other: No abdominal wall hernia or abnormality. No abdominopelvic ascites. Musculoskeletal: No acute or significant osseous findings. IMPRESSION: 1. No CT findings of the abdomen or pelvis to explain weight loss. 2. Severe pancolonic diverticulosis, worst in the sigmoid. No evidence of acute diverticulitis. 3. Status post cholecystectomy and hysterectomy. 4. Aortic Atherosclerosis (ICD10-I70.0). Electronically Signed   By: AEddie CandleM.D.   On: 07/27/2020 14:00   DG Chest Portable 1 View  Result Date: 08/07/2020 CLINICAL DATA:  Shortness of breath today. EXAM: PORTABLE CHEST 1 VIEW COMPARISON:   06/08/2020 and 04/06/2020 FINDINGS: Lungs are adequately inflated without acute airspace consolidation or effusion. Calcified granuloma left base. Cardiomediastinal silhouette and remainder of the  exam is unchanged. IMPRESSION: No acute cardiopulmonary disease. Electronically Signed   By: Marin Olp M.D.   On: 08/07/2020 12:27    ECG & Cardiac Imaging    RSR, 74, no acute ST/T changes - personally reviewed.  Assessment & Plan    1.  Unstable angina with mild high-sensitivity troponin elevation/coronary artery disease: Patient with prior history of coronary artery disease status post non-STEMI and LAD drug-eluting stent placement in December 2019.  She was noted to have moderate residual distal LAD disease at that time.  In June of this year, in the setting of indigestion, she underwent stress testing which was nonischemic.  She awoke yesterday morning with diaphoresis and mild dyspnea.  The dyspnea persisted throughout the day.  This morning, she developed substernal chest heaviness associated with dyspnea and after about 2 hours, she activated her life alert and was attended to by EMS.  Shortly after arrival in the emergency department, she reported being chest pain-free.  Initial troponin was elevated at 195 with a subsequent troponin of 187.  She is currently in no distress and hemodynamically stable.  Her ECG shows no acute ST or T changes.  It is possible that were seeing a downtrending troponin following an event yesterday the morning, though this is speculative only.  Today symptoms are concerning for angina and I agree with admission and heparinization over the weekend.  Given recent negative Myoview, would not plan to repeat this and instead, we will pursue diagnostic catheterization on Monday morning. The patient understands that risks include but are not limited to stroke (1 in 1000), death (1 in 41), kidney failure [usually temporary] (1 in 500), bleeding (1 in 200), allergic reaction  [possibly serious] (1 in 200), and agrees to proceed.  Resume home doses of aspirin, statin, and beta-blocker therapy.  I will add nitrate in setting of rest Ss over past 2 days.  2.  Essential hypertension: Blood pressure currently stable.  She is on beta-blocker and lisinopril therapy at home.  Resume.  3.  Hyperlipidemia: LDL of 90 in May.  She is on atorvastatin 80 mg at home.  Follow-up fasting profile here and consider adding Zetia if still not at goal.  4.  Type 2 diabetes mellitus: Home dose of metformin on hold.  Insulin management per internal medicine.  5.  Iron deficiency anemia: She has been receiving IV iron and H&H were normal today.  6.  PSVT: Patient with brief runs of SVT during monitoring earlier this year.  Continue beta-blocker therapy.  Signed, Murray Hodgkins, NP 08/07/2020, 3:00 PM  For questions or updates, please contact   Please consult [email or web address] for contact info under Cardiology/STEMI.

## 2020-08-07 NOTE — Telephone Encounter (Signed)
LMTCB. Patient is in the ED at this time.

## 2020-08-07 NOTE — Telephone Encounter (Signed)
Pt admitted to hospital  

## 2020-08-07 NOTE — H&P (Signed)
History and Physical    [place] DEY:[id number] DOB: 1933-04-25 DOA: 08/07/2020  Referring MD/NP/PA:   PCP: Einar Pheasant, MD   Patient coming from:  The patient is coming from home.  At baseline, pt is partially dependent for most of ADL.        Chief Complaint: SOB and chest pain  HPI: Tanya Cole is a 86 y.o. female with medical history significant of CAD, DES placement 2019, HTN, HLD, DM, asthma, PMR, presents with SOB and chest pain  Pt states that her SOB and chest pain started at about 10:30.  The chest pain is located in the substernal area, mild to moderate, pressure-like, nonradiating.  Not pleuritic.  Patient has mild dry cough, no fever or chills. Per report, pt had oxygen saturation 89% on room air initially, currently 93% on room air. Her shortness of breath has improved after receiving Solu-Medrol and duo nebs by EMS.  Currently oxygen saturation 93% on room air.  Patient denies nausea, vomiting, diarrhea, abdominal pain, symptoms of UTI or unilateral weakness.    Of note, she had Myoview study in March 2021 which showed no ischemia, normal EF. She used be on plavix which was discontinued by cardiologist in July due to anemia. She is maintained on baby aspirin daily currently.   ED Course: pt was found to have troponin 195, negative Covid PCR, negative urinalysis, lipase 23, electrolytes renal function okay, temperature normal, blood pressure 170/77, heart rate 74, RR 20, chest x-ray negative.  Patient is admitted to progressive bed as inpatient.  Cardiology, Dr. Rockey Situ is consulted.  Review of Systems:   General: no fevers, chills, no body weight gain, has fatigue HEENT: no blurry vision, hearing changes or sore throat Respiratory: has dyspnea, coughing, no wheezing CV: has chest pain, no palpitations GI: no nausea, vomiting, abdominal pain, diarrhea, constipation GU: no dysuria, burning on urination, increased urinary frequency, hematuria  Ext: no leg  edema Neuro: no unilateral weakness, numbness, or tingling, no vision change or hearing loss Skin: no rash, no skin tear. MSK: No muscle spasm, no deformity, no limitation of range of movement in spin Heme: No easy bruising.  Travel history: No recent long distant travel.  Allergy:  Allergies  Allergen Reactions  . Tramadol Itching and Nausea And Vomiting    Past Medical History:  Diagnosis Date  . Aortic insufficiency    a. TTE 12/19: EF 55-60%, probable HK of the mid apical anterior septal myocardium, Gr1DD, mild AI, mildly dilated LA  . Asthma   . CAD (coronary artery disease)    a. NSTEMI 12/19; b. LHC 10/08/18: LM minimal luminal irregs, mLAD-1 95% s/p PCI/DES, mLAD-2 60%, LCx mild diffuse disease throughout, RCA minimal luminal irregs; b. 03/2020 MV: EF>65%, no ischemia/scar.  . Diabetes mellitus ([place])   . Hypercholesterolemia   . Hypertension   . Myocardial infarction ([place])   . Osteopenia   . Palpitations    a. 04/2020 [facility]: Avg HR 75. 429 SVT episodes, longest 19 secs @ 133. Occas PACs (3.2%). Rare PVCs (<1%).  . Polymyalgia rheumatica syndrome ([place])   . Reactive airway disease     Past Surgical History:  Procedure Laterality Date  . ABDOMINAL HYSTERECTOMY  1981   prolapse and bleeding, ovaries not removed  . BREAST EXCISIONAL BIOPSY Right   . CHOLECYSTECTOMY N/A 09/02/2019   Procedure: LAPAROSCOPIC CHOLECYSTECTOMY WITH INTRAOPERATIVE CHOLANGIOGRAM;  Surgeon: Olean Ree, MD;  Location: [facility];  Service: General;  Laterality: N/A;  . CORONARY STENT  INTERVENTION N/A 10/08/2018   Procedure: CORONARY STENT INTERVENTION;  Surgeon: Wellington Hampshire, MD;  Location: [place] [facility];  Service: Cardiovascular;  Laterality: N/A;  . ENDOSCOPIC RETROGRADE CHOLANGIOPANCREATOGRAPHY (ERCP) WITH PROPOFOL N/A 08/08/2019   Procedure: ENDOSCOPIC RETROGRADE CHOLANGIOPANCREATOGRAPHY (ERCP) WITH PROPOFOL;  Surgeon: Lucilla Lame, MD;  Location: [facility];  Service: Endoscopy;   Laterality: N/A;  . LEFT HEART CATH AND CORONARY ANGIOGRAPHY N/A 10/08/2018   Procedure: LEFT HEART CATH AND CORONARY ANGIOGRAPHY;  Surgeon: Wellington Hampshire, MD;  Location: [place] [facility];  Service: Cardiovascular;  Laterality: N/A;  . UMBILICAL HERNIA REPAIR  7/94    Social History:  reports that she has never smoked. She has never used smokeless tobacco. She reports that she does not drink alcohol and does not use drugs.  Family History:  Family History  Problem Relation Age of Onset  . Heart attack Father   . Arthritis Mother   . Heart disease Mother   . Throat cancer Sister   . Parkinson's disease Sister   . COPD Brother      Prior to Admission medications   Medication Sig Start Date End Date Taking? Authorizing Provider  alendronate (FOSAMAX) 70 MG tablet Take 70 mg by mouth once a week.  11/06/17  Yes [provider]  aspirin 81 MG tablet Take 81 mg by mouth daily.   Yes [provider]  atorvastatin (LIPITOR) 80 MG tablet Take 1 tablet (80 mg total) by mouth daily at 6 PM. 04/01/20  Yes Loel Dubonnet, NP  ferrous sulfate 325 (65 FE) MG tablet Take 325 mg by mouth daily with breakfast.   Yes [provider]  lisinopril (ZESTRIL) 20 MG tablet Take 1 tablet by mouth once daily 06/11/20  Yes Dunn, Ryan M, PA-C  metFORMIN (GLUCOPHAGE) 500 MG tablet TAKE 1 TABLET BY MOUTH TWICE DAILY WITH A MEAL 05/05/20  Yes Einar Pheasant, MD  metoprolol tartrate (LOPRESSOR) 25 MG tablet Take 1 tablet (25 mg total) by mouth 2 (two) times daily. 06/08/20  Yes Loel Dubonnet, NP  Multiple Vitamins-Minerals (VISION FORMULA/LUTEIN PO) Take 1 tablet by mouth daily.    Yes [provider]  Vitamin D, Ergocalciferol, (DRISDOL) 50000 units CAPS capsule Take 50,000 Units by mouth once a week. 06/24/18  Yes [provider]  ADVAIR DISKUS 250-50 MCG/DOSE AEPB INHALE ONE PUFF BY MOUTH EVERY 12 HOURS. RINSE MOUTH AFTER EACH USE Patient not taking: Reported on  08/07/2020 02/17/16   Einar Pheasant, MD  albuterol (PROVENTIL) (2.5 MG/3ML) 0.083% nebulizer solution Take 3 mLs (2.5 mg total) by nebulization every 4 (four) hours as needed for wheezing or shortness of breath. 06/08/20   Einar Pheasant, MD  fluticasone (FLONASE) 50 MCG/ACT nasal spray Use 2 spray(s) in each nostril once daily 08/03/20   Einar Pheasant, MD  ibuprofen (ADVIL) 400 MG tablet Take 1 tablet (400 mg total) by mouth every 8 (eight) hours as needed for mild pain or moderate pain. Patient not taking: Reported on 08/07/2020 09/02/19   Olean Ree, MD  Nebulizers (COMPRESSOR/NEBULIZER) MISC 1 Units by Does not apply route as directed. 04/07/20   Arta Silence, MD  nitroGLYCERIN (NITROSTAT) 0.4 MG SL tablet Place 1 tablet (0.4 mg total) under the tongue every 5 (five) minutes as needed for chest pain. 10/09/18   Bettey Costa, MD  PROAIR HFA 108 (90 Base) MCG/ACT inhaler Inhale 2 puffs into the lungs every 4 (four) hours as needed for wheezing or shortness of breath. 06/05/20  Einar Pheasant, MD  Sod Picosulfate-Mag Ox-Cit Acd (CLENPIQ) 10-3.5-12 MG-GM -GM/160ML SOLN Take 1 bottle at 5 PM followed by five 8 oz cups of water and repeat 5 hours before procedure. Patient not taking: Reported on 08/07/2020 06/16/20   Virgel Manifold, MD  Sodium Sulfate-Mag Sulfate-KCl (SUTAB) [phone] MG TABS At 5 PM take 12 tablets using the 8 oz cup provided in the kit drinking 5 cups of water and 5 hours before your procedure repeat the same process. Patient not taking: Reported on 08/07/2020 05/19/20   Virgel Manifold, MD    Physical Exam: Vitals:   08/07/20 1500 08/07/20 1530 08/07/20 1600 08/07/20 1630  BP: 128/62 (!) 123/52 (!) 123/54 (!) 133/54  Pulse: 64 67 64 63  Resp: (!) _0 Temp:      TempSrc:      SpO2: 94% 93% 95% 94%  Weight:      Height:       General: Not in acute distress HEENT:       Eyes: PERRL, EOMI, no scleral icterus.       ENT: No discharge from the ears and  nose, no pharynx injection, no tonsillar enlargement.        Neck: No JVD, no bruit, no mass felt. Heme: No neck lymph node enlargement. Cardiac: S1/S2, RRR, has 2/6 murmurs, No gallops or rubs. Respiratory: No rales, wheezing, rhonchi or rubs. GI: Soft, nondistended, nontender, no rebound pain, no organomegaly, BS present. GU: No hematuria Ext: No pitting leg edema bilaterally. 2+DP/PT pulse bilaterally. Musculoskeletal: No joint deformities, No joint redness or warmth, no limitation of ROM in spin. Skin: No rashes.  Neuro: Alert, oriented X3, cranial nerves II-XII grossly intact, moves all extremities normally. Psych: Patient is not psychotic, no suicidal or hemocidal ideation.  Labs on Admission: I have personally reviewed following labs and imaging studies  CBC: Recent Labs  Lab 08/07/20 1135  WBC 12.0*  NEUTROABS 8.0*  HGB 13.1  HCT 40.3  MCV 83.8  PLT 500   Basic Metabolic Panel: Recent Labs  Lab 08/07/20 1135  NA 140  K 3.8  CL 105  CO2 23  GLUCOSE 140*  BUN 9  CREATININE 0.63  CALCIUM 8.9   GFR: Estimated Creatinine Clearance: 38.8 mL/min (by C-G formula based on SCr of 0.63 mg/dL). Liver Function Tests: Recent Labs  Lab 08/07/20 1135  AST 19  ALT 12  ALKPHOS 64  BILITOT 0.8  PROT 7.3  ALBUMIN 4.2   Recent Labs  Lab 08/07/20 1135  LIPASE 23   No results for input(s): AMMONIA in the last 168 hours. Coagulation Profile: Recent Labs  Lab 08/07/20 1135  INR 1.1   Cardiac Enzymes: No results for input(s): CKTOTAL, CKMB, CKMBINDEX, TROPONINI in the last 168 hours. BNP (last 3 results) No results for input(s): PROBNP in the last 8760 hours. HbA1C: No results for input(s): HGBA1C in the last 72 hours. CBG: Recent Labs  Lab 08/07/20 1623  GLUCAP 89   Lipid Profile: No results for input(s): CHOL, HDL, LDLCALC, TRIG, CHOLHDL, LDLDIRECT in the last 72 hours. Thyroid Function Tests: No results for input(s): TSH, T4TOTAL, FREET4, T3FREE,  THYROIDAB in the last 72 hours. Anemia Panel: No results for input(s): VITAMINB12, FOLATE, FERRITIN, TIBC, IRON, RETICCTPCT in the last 72 hours. Urine analysis:    Component Value Date/Time   COLORURINE YELLOW (A) 08/07/2020 1212   APPEARANCEUR CLEAR (A) 08/07/2020 1212   APPEARANCEUR Clear 02/18/2013 2004   LABSPEC 1.011 08/07/2020  1212   LABSPEC 1.024 02/18/2013 2004   PHURINE 6.0 08/07/2020 1212   GLUCOSEU NEGATIVE 08/07/2020 1212   GLUCOSEU NEGATIVE 01/28/2015 0810   HGBUR NEGATIVE 08/07/2020 1212   BILIRUBINUR NEGATIVE 08/07/2020 1212   BILIRUBINUR neg 01/16/2015 1437   BILIRUBINUR Negative 02/18/2013 2004   KETONESUR NEGATIVE 08/07/2020 1212   PROTEINUR 100 (A) 08/07/2020 1212   UROBILINOGEN 0.2 01/28/2015 0810   NITRITE NEGATIVE 08/07/2020 1212   LEUKOCYTESUR NEGATIVE 08/07/2020 1212   LEUKOCYTESUR Trace 02/18/2013 2004   Sepsis Labs: $[id number]$ (procalcitonin:4,lacticidven:4) ) Recent Results (from the past 240 hour(s))  Respiratory Panel by RT PCR (Flu A&B, Covid) - Nasopharyngeal Swab     Status: None   Collection Time: 08/07/20 11:43 AM   Specimen: Nasopharyngeal Swab  Result Value Ref Range Status   SARS Coronavirus 2 by RT PCR NEGATIVE NEGATIVE Final    Comment: (NOTE) SARS-CoV-2 target nucleic acids are NOT DETECTED.  The SARS-CoV-2 RNA is generally detectable in upper respiratoy specimens during the acute phase of infection. The lowest concentration of SARS-CoV-2 viral copies this assay can detect is 131 copies/mL. A negative result does not preclude SARS-Cov-2 infection and should not be used as the sole basis for treatment or other patient management decisions. A negative result may occur with  improper specimen collection/handling, submission of specimen other than nasopharyngeal swab, presence of viral mutation(s) within the areas targeted by this assay, and inadequate number of viral copies (<131 copies/mL). A negative result must be combined with  clinical observations, patient history, and epidemiological information. The expected result is Negative.  Fact Sheet for Patients:  PinkCheek.be  Fact Sheet for Healthcare Providers:  GravelBags.it  This test is no t yet approved or cleared by the [place] FDA and  has been authorized for detection and/or diagnosis of SARS-CoV-2 by FDA under an Emergency Use Authorization (EUA). This EUA will remain  in effect (meaning this test can be used) for the duration of the COVID-19 declaration under Section 564(b)(1) of the Act, 21 U.S.C. section 360bbb-3(b)(1), unless the authorization is terminated or revoked sooner.     Influenza A by PCR NEGATIVE NEGATIVE Final   Influenza B by PCR NEGATIVE NEGATIVE Final    Comment: (NOTE) The Xpert Xpress SARS-CoV-2/FLU/RSV assay is intended as an aid in  the diagnosis of influenza from Nasopharyngeal swab specimens and  should not be used as a sole basis for treatment. Nasal washings and  aspirates are unacceptable for Xpert Xpress SARS-CoV-2/FLU/RSV  testing.  Fact Sheet for Patients: PinkCheek.be  Fact Sheet for Healthcare Providers: GravelBags.it  This test is not yet approved or cleared by the [place] FDA and  has been authorized for detection and/or diagnosis of SARS-CoV-2 by  FDA under an Emergency Use Authorization (EUA). This EUA will remain  in effect (meaning this test can be used) for the duration of the  Covid-19 declaration under Section 564(b)(1) of the Act, 21  U.S.C. section 360bbb-3(b)(1), unless the authorization is  terminated or revoked. Performed at [facility], [address] [place], [place] [address]      Radiological Exams on Admission: DG Chest Portable 1 View  Result Date: 08/07/2020 CLINICAL DATA:  Shortness of breath today. EXAM: PORTABLE CHEST 1 VIEW COMPARISON:   06/08/2020 and 04/06/2020 FINDINGS: Lungs are adequately inflated without acute airspace consolidation or effusion. Calcified granuloma left base. Cardiomediastinal silhouette and remainder of the exam is unchanged. IMPRESSION: No acute cardiopulmonary disease. Electronically Signed   By: Marin Olp M.D.  On: 08/07/2020 12:27     EKG: Independently reviewed.  Sinus rhythm, QTC 444, early R wave progression, nonspecific T wave change  Assessment/Plan Principal Problem:   NSTEMI (non-ST elevated myocardial infarction) ([place]) Active Problems:   Hypertension   Hypercholesterolemia   Diabetes mellitus with cardiac complication (HCC)   Coronary artery disease   Iron deficiency   Asthma   Leukocytosis   NSTEMI (non-ST elevated myocardial infarction) and a history of CAD: Troponin 195.  Cardiology, Dr. Rockey Situ is consulted.  - admit to progressive unit as inpatient - IV heparin - Trend Trop - Repeat EKG in the am  - prn Nitroglycerin, Morphine, and aspirin, lipitor, metoprolol - Risk factor stratification: will check FLP and A1C  - 2d echo  Hypertension -IV hydralazine as needed -Continue home metoprolol, lisinopril  Hypercholesterolemia -Lipitor  Diabetes mellitus with cardiac complication [facility]): Recent A1c 7.4, poorly controlled.  Patient is taking Metformin at home -Sliding scale insulin  Iron deficiency anemia: Hemoglobin 13.1 -Continue iron supplement  Asthma -Bronchodilators, as needed Mucinex  Leukocytosis: WBC 12.0.  No fever.  No source of infection identified, likely reactive -Follow-up by CBC         DVT ppx: on IV Heparin        Code Status: DNR Family Communication: Yes, patient's daughter  at bed side Disposition Plan:  Anticipate discharge back to previous environment Consults called:  Dr. Rockey Situ Admission status: progressive unit  as inpt      Status is: Inpatient  Remains inpatient appropriate because:Inpatient level of care appropriate due  to severity of illness.  Patient has multiple comorbidities, now presents with non-STEMI, elevated troponin.  Her presentation is highly complicated.  Patient will likely need cardiac catheterization.  Will need to be treated in the hospital for at least 2 days.   Dispo: The patient is from: Home              Anticipated d/c is to: Home              Anticipated d/c date is: 2 days              Patient currently is not medically stable to d/c.            Date of Service 08/07/2020    [facility]   If 7PM-7AM, please contact night-coverage [email or web address] 08/07/2020, 6:29 PM

## 2020-08-07 NOTE — ED Notes (Signed)
Pt given dinner tray.

## 2020-08-07 NOTE — ED Provider Notes (Signed)
[facility] Emergency Department Provider Note  ____________________________________________  Time seen: Approximately 12:39 PM  I have reviewed the triage vital signs and the nursing notes.   HISTORY  Chief Complaint Shortness of Breath    HPI Tanya Cole is a 86 y.o. female with a history of CAD, diabetes hypertension who is brought to the ED due to a sudden onset of shortness of breath that started this morning at about 10:30 AM.  Constant, no aggravating or alleviating factors.  Associated with some central chest pressure  which is nonradiating.  Patient given Solu-Medrol and duo nebs by EMS who noted room air oxygen saturation of 89%.  Patient reports compliance with all her medications.  Reports having a PCI 2 years ago.  Her cardiologist is Dr. Fletcher Anon.  She had Myoview study in March 2021 which showed no ischemia, normal EF, and on follow-up with cardiology in July her Plavix was discontinued due to anemia, and she is maintained on baby aspirin daily for antiplatelet therapy.    Past Medical History:  Diagnosis Date  . Aortic insufficiency    a. TTE 12/19: EF 55-60%, probable HK of the mid apical anterior septal myocardium, Gr1DD, mild AI, mildly dilated LA  . Asthma   . CAD (coronary artery disease)    a. NSTEMI 12/19; b. LHC 10/08/18: LM minimal luminal irregs, mLAD-1 95% s/p PCI/DES, mLAD-2 60%, LCx mild diffuse disease throughout, RCA minimal luminal irregs  . Diabetes mellitus ([place])   . Hypercholesterolemia   . Hypertension   . Myocardial infarction ([place])   . Osteopenia   . Polymyalgia rheumatica syndrome ([place])   . Reactive airway disease      Patient Active Problem List   Diagnosis Date Noted  . Left shoulder pain 07/12/2020  . Iron deficiency 04/16/2020  . Anemia 04/04/2020  . SOB (shortness of breath) 03/25/2020  . Cough 09/30/2019  . Gallstone pancreatitis   . Pre-op evaluation 08/30/2019  . Cholecystitis 08/05/2019  .  Choledocholithiasis   . Respiratory illness 06/18/2019  . Coronary artery disease 12/29/2018  . Non-ST elevation (NSTEMI) myocardial infarction ([place])   . Chest pain 10/07/2018  . Memory change 05/27/2018  . Osteoporosis 02/05/2018  . Weight loss 06/24/2017  . Abdominal pain, left lower quadrant 10/05/2016  . Long term current use of systemic steroids 05/16/2016  . Elevated erythrocyte sedimentation rate 05/04/2016  . Back pain 04/29/2016  . Fatigue 05/24/2015  . Health care maintenance 01/25/2015  . UTI (urinary tract infection) 07/07/2014  . Neuropathy 03/24/2014  . Diverticulitis 02/24/2013  . Reactive airway disease 09/15/2012  . Hypertension 09/14/2012  . Hypercholesterolemia 09/14/2012  . Diabetes mellitus with cardiac complication ([place]) 37/34/2876     Past Surgical History:  Procedure Laterality Date  . ABDOMINAL HYSTERECTOMY  1981   prolapse and bleeding, ovaries not removed  . BREAST EXCISIONAL BIOPSY Right   . CHOLECYSTECTOMY N/A 09/02/2019   Procedure: LAPAROSCOPIC CHOLECYSTECTOMY WITH INTRAOPERATIVE CHOLANGIOGRAM;  Surgeon: Olean Ree, MD;  Location: [facility] ORS;  Service: General;  Laterality: N/A;  . CORONARY STENT INTERVENTION N/A 10/08/2018   Procedure: CORONARY STENT INTERVENTION;  Surgeon: Wellington Hampshire, MD;  Location: [place] [facility];  Service: Cardiovascular;  Laterality: N/A;  . ENDOSCOPIC RETROGRADE CHOLANGIOPANCREATOGRAPHY (ERCP) WITH PROPOFOL N/A 08/08/2019   Procedure: ENDOSCOPIC RETROGRADE CHOLANGIOPANCREATOGRAPHY (ERCP) WITH PROPOFOL;  Surgeon: Lucilla Lame, MD;  Location: [facility] ENDOSCOPY;  Service: Endoscopy;  Laterality: N/A;  . LEFT HEART CATH AND CORONARY ANGIOGRAPHY N/A 10/08/2018   Procedure: LEFT  HEART CATH AND CORONARY ANGIOGRAPHY;  Surgeon: Wellington Hampshire, MD;  Location: [place] [facility];  Service: Cardiovascular;  Laterality: N/A;  . UMBILICAL HERNIA REPAIR  7/94     Prior to Admission medications   Medication Sig Start Date End  Date Taking? Authorizing Provider  alendronate (FOSAMAX) 70 MG tablet Take 70 mg by mouth once a week.  11/06/17  Yes [provider]  aspirin 81 MG tablet Take 81 mg by mouth daily.   Yes [provider]  atorvastatin (LIPITOR) 80 MG tablet Take 1 tablet (80 mg total) by mouth daily at 6 PM. 04/01/20  Yes Loel Dubonnet, NP  ferrous sulfate 325 (65 FE) MG tablet Take 325 mg by mouth daily with breakfast.   Yes [provider]  lisinopril (ZESTRIL) 20 MG tablet Take 1 tablet by mouth once daily 06/11/20  Yes Dunn, Ryan M, PA-C  metFORMIN (GLUCOPHAGE) 500 MG tablet TAKE 1 TABLET BY MOUTH TWICE DAILY WITH A MEAL 05/05/20  Yes Einar Pheasant, MD  metoprolol tartrate (LOPRESSOR) 25 MG tablet Take 1 tablet (25 mg total) by mouth 2 (two) times daily. 06/08/20  Yes Loel Dubonnet, NP  Multiple Vitamins-Minerals (VISION FORMULA/LUTEIN PO) Take 1 tablet by mouth daily.    Yes [provider]  Vitamin D, Ergocalciferol, (DRISDOL) 50000 units CAPS capsule Take 50,000 Units by mouth once a week. 06/24/18  Yes [provider]  ADVAIR DISKUS 250-50 MCG/DOSE AEPB INHALE ONE PUFF BY MOUTH EVERY 12 HOURS. RINSE MOUTH AFTER EACH USE Patient not taking: Reported on 08/07/2020 02/17/16   Einar Pheasant, MD  albuterol (PROVENTIL) (2.5 MG/3ML) 0.083% nebulizer solution Take 3 mLs (2.5 mg total) by nebulization every 4 (four) hours as needed for wheezing or shortness of breath. 06/08/20   Einar Pheasant, MD  fluticasone (FLONASE) 50 MCG/ACT nasal spray Use 2 spray(s) in each nostril once daily 08/03/20   Einar Pheasant, MD  ibuprofen (ADVIL) 400 MG tablet Take 1 tablet (400 mg total) by mouth every 8 (eight) hours as needed for mild pain or moderate pain. Patient not taking: Reported on 08/07/2020 09/02/19   Olean Ree, MD  Nebulizers (COMPRESSOR/NEBULIZER) MISC 1 Units by Does not apply route as directed. 04/07/20   Arta Silence, MD  nitroGLYCERIN (NITROSTAT) 0.4 MG SL tablet  Place 1 tablet (0.4 mg total) under the tongue every 5 (five) minutes as needed for chest pain. 10/09/18   Bettey Costa, MD  PROAIR HFA 108 (90 Base) MCG/ACT inhaler Inhale 2 puffs into the lungs every 4 (four) hours as needed for wheezing or shortness of breath. 06/05/20   Einar Pheasant, MD  Sod Picosulfate-Mag Ox-Cit Acd (CLENPIQ) 10-3.5-12 MG-GM -GM/160ML SOLN Take 1 bottle at 5 PM followed by five 8 oz cups of water and repeat 5 hours before procedure. Patient not taking: Reported on 08/07/2020 06/16/20   Virgel Manifold, MD  Sodium Sulfate-Mag Sulfate-KCl (SUTAB) [phone] MG TABS At 5 PM take 12 tablets using the 8 oz cup provided in the kit drinking 5 cups of water and 5 hours before your procedure repeat the same process. Patient not taking: Reported on 08/07/2020 05/19/20   Virgel Manifold, MD     Allergies Tramadol   Family History  Problem Relation Age of Onset  . Heart attack Father   . Arthritis Mother   . Heart disease Mother   . Throat cancer Sister   . Parkinson's disease Sister   . COPD Brother     Social  History Social History   Tobacco Use  . Smoking status: Never Smoker  . Smokeless tobacco: Never Used  Vaping Use  . Vaping Use: Never used  Substance Use Topics  . Alcohol use: No    Alcohol/week: 0.0 standard drinks  . Drug use: No    Review of Systems  Constitutional:   No fever or chills.  Positive bilateral frontal headache ENT:   No sore throat. No rhinorrhea. Cardiovascular:   Positive chest pain as above without syncope. Respiratory: Positive shortness of breath without cough. Gastrointestinal:   Negative for abdominal pain, vomiting and diarrhea.  Musculoskeletal:   Negative for focal pain or swelling.  Positive for body aches All other systems reviewed and are negative except as documented above in ROS and HPI.  ____________________________________________   PHYSICAL EXAM:  VITAL SIGNS: ED Triage Vitals  Enc Vitals Group      BP 08/07/20 1128 (!) 170/77     Pulse Rate 08/07/20 1143 74     Resp 08/07/20 1143 20     Temp 08/07/20 1128 98.8 F (37.1 C)     Temp Source 08/07/20 1128 Oral     SpO2 08/07/20 1128 93 %     Weight 08/07/20 1129 119 lb (54 kg)     Height 08/07/20 1129 5' 1" (1.549 m)     Head Circumference --      Peak Flow --      Pain Score 08/07/20 1128 0     Pain Loc --      Pain Edu? --      Excl. in [place]? --     Vital signs reviewed, nursing assessments reviewed.   Constitutional:   Alert and oriented. Non-toxic appearance. Eyes:   Conjunctivae are normal. EOMI. PERRL. ENT      Head:   Normocephalic and atraumatic.      Nose:   Wearing a mask.      Mouth/Throat:   Wearing a mask.      Neck:   No meningismus. Full ROM. Hematological/Lymphatic/Immunilogical:   No cervical lymphadenopathy. Cardiovascular:   RRR. Symmetric bilateral radial and DP pulses.  No murmurs. Cap refill less than 2 seconds. Respiratory:   Normal respiratory effort without tachypnea/retractions. Breath sounds are clear and equal bilaterally. No wheezes/rales/rhonchi.  No inducible wheezing or cough with FEV1 maneuver Gastrointestinal:   Soft and nontender. Non distended. There is no CVA tenderness.  No rebound, rigidity, or guarding.  Musculoskeletal:   Normal range of motion in all extremities. No joint effusions.  No lower extremity tenderness.  No edema. Neurologic:   Normal speech and language.  Motor grossly intact. No acute focal neurologic deficits are appreciated.  Skin:    Skin is warm, dry and intact. No rash noted.  No petechiae, purpura, or bullae.  ____________________________________________    LABS (pertinent positives/negatives) (all labs ordered are listed, but only abnormal results are displayed) Labs Reviewed  COMPREHENSIVE METABOLIC PANEL - Abnormal; Notable for the following components:      Result Value   Glucose, Bld 140 (*)    All other components within normal limits  CBC WITH  DIFFERENTIAL/PLATELET - Abnormal; Notable for the following components:   WBC 12.0 (*)    RDW 16.6 (*)    Neutro Abs 8.0 (*)    Eosinophils Absolute 1.3 (*)    All other components within normal limits  TROPONIN I (HIGH SENSITIVITY) - Abnormal; Notable for the following components:   Troponin I (High Sensitivity) 195 (*)  All other components within normal limits  RESPIRATORY PANEL BY RT PCR (FLU A&B, COVID)  LIPASE, BLOOD  URINALYSIS, COMPLETE (UACMP) WITH MICROSCOPIC  APTT  PROTIME-INR   ____________________________________________   EKG  Interpreted by me  Date: 08/07/2020  Rate: 74  Rhythm: normal sinus rhythm  QRS Axis: normal  Intervals: normal  ST/T Wave abnormalities: normal  Conduction Disutrbances: none  Narrative Interpretation: unremarkable      ____________________________________________    RADIOLOGY  DG Chest Portable 1 View  Result Date: 08/07/2020 CLINICAL DATA:  Shortness of breath today. EXAM: PORTABLE CHEST 1 VIEW COMPARISON:  06/08/2020 and 04/06/2020 FINDINGS: Lungs are adequately inflated without acute airspace consolidation or effusion. Calcified granuloma left base. Cardiomediastinal silhouette and remainder of the exam is unchanged. IMPRESSION: No acute cardiopulmonary disease. Electronically Signed   By: Marin Olp M.D.   On: 08/07/2020 12:27    ____________________________________________   PROCEDURES .Critical Care Performed by: Carrie Mew, MD Authorized by: Carrie Mew, MD   Critical care provider statement:    Critical care time (minutes):  35   Critical care time was exclusive of:  Separately billable procedures and treating other patients   Critical care was necessary to treat or prevent imminent or life-threatening deterioration of the following conditions:  Cardiac failure   Critical care was time spent personally by me on the following activities:  Development of treatment plan with patient or surrogate,  discussions with consultants, evaluation of patient's response to treatment, examination of patient, obtaining history from patient or surrogate, ordering and performing treatments and interventions, ordering and review of laboratory studies, ordering and review of radiographic studies, pulse oximetry, re-evaluation of patient's condition and review of old charts    ____________________________________________  DIFFERENTIAL DIAGNOSIS   Pneumonia, bronchospasm, pneumothorax, non-STEMI, Covid/viral illness  CLINICAL IMPRESSION / ASSESSMENT AND PLAN / ED COURSE  Medications ordered in the ED: Medications  aspirin chewable tablet 324 mg (has no administration in time range)  sodium chloride 0.9 % bolus 500 mL (0 mLs Intravenous Stopped 08/07/20 1214)  ondansetron (ZOFRAN) injection 4 mg (4 mg Intravenous Given 08/07/20 1150)  acetaminophen (TYLENOL) tablet 1,000 mg (1,000 mg Oral Given 08/07/20 1151)    Pertinent labs & imaging results that were available during my care of the patient were reviewed by me and considered in my medical decision making (see chart for details).  [place] was evaluated in Emergency Department on 08/07/2020 for the symptoms described in the history of present illness. She was evaluated in the context of the global COVID-19 pandemic, which necessitated consideration that the patient might be at risk for infection with the SARS-CoV-2 virus that causes COVID-19. Institutional protocols and algorithms that pertain to the evaluation of patients at risk for COVID-19 are in a state of rapid change based on information released by regulatory bodies including the CDC and federal and state organizations. These policies and algorithms were followed during the patient's care in the ED.   Patient presents with sudden onset of shortness of breath this morning.  Exam is reassuring, she arrives on 2 L nasal cannula due to some mild hypoxia by EMS.  EKG normal.  Will check labs  and chest x-ray and Covid test.  ----------------------------------------- 12:43 PM on 08/07/2020 -----------------------------------------  Troponin elevated to 195, which is new.  She reports that her shortness of breath and chest pain are currently resolved and she is now maintaining a sat of 94% on room air, but with her age and comorbidities, recommend hospitalization  for further cardiac work-up.  Her presentation is not consistent with cardiogenic shock or acute heart failure.  Will start heparin for now and give full dose aspirin.  Patient is agreeable to this plan.      ____________________________________________   FINAL CLINICAL IMPRESSION(S) / ED DIAGNOSES    Final diagnoses:  Shortness of breath  NSTEMI (non-ST elevated myocardial infarction) [facility])     ED Discharge Orders    None      Portions of this note were generated with dragon dictation software. Dictation errors may occur despite best attempts at proofreading.   Carrie Mew, MD 08/07/20 1246

## 2020-08-07 NOTE — Progress Notes (Signed)
ANTICOAGULATION CONSULT NOTE - Initial Consult  Pharmacy Consult for Heparin Indication: chest pain/ACS  Allergies  Allergen Reactions  . Tramadol Itching and Nausea And Vomiting    Patient Measurements: Height: 5\' 1"  (154.9 cm) Weight: 54 kg (119 lb) IBW/kg (Calculated) : 47.8 Heparin Dosing Weight: 54 kg  Vital Signs: Temp: 98.8 F (37.1 C) (10/08 1128) Temp Source: Oral (10/08 1128) BP: 134/59 (10/08 1230) Pulse Rate: 66 (10/08 1230)  Labs: Recent Labs    08/07/20 1135  HGB 13.1  HCT 40.3  PLT 275  CREATININE 0.63  TROPONINIHS 195*    Estimated Creatinine Clearance: 38.8 mL/min (by C-G formula based on SCr of 0.63 mg/dL).   Medical History: Past Medical History:  Diagnosis Date  . Aortic insufficiency    a. TTE 12/19: EF 55-60%, probable HK of the mid apical anterior septal myocardium, Gr1DD, mild AI, mildly dilated LA  . Asthma   . CAD (coronary artery disease)    a. NSTEMI 12/19; b. LHC 10/08/18: LM minimal luminal irregs, mLAD-1 95% s/p PCI/DES, mLAD-2 60%, LCx mild diffuse disease throughout, RCA minimal luminal irregs  . Diabetes mellitus ([facility])   . Hypercholesterolemia   . Hypertension   . Myocardial infarction ([facility])   . Osteopenia   . Polymyalgia rheumatica syndrome ([facility])   . Reactive airway disease       Assessment: 86 yo female here with elevated troponin to start on Heparin drip for NSTEMI. No oral anticoagulants noted on PTA med list.   Goal of Therapy:  Heparin level 0.3-0.7 units/ml Monitor platelets by anticoagulation protocol: Yes   Plan:  APTT and INR being added on to labs - INR 1.1, aPTT 32 Heparin bolus 3200 units IV x1 then heparin drip at 650 units/hr HL 8h after start of drip, CBC in AM  Pharmacy will continue to follow.   83 08/07/2020,1:02 PM

## 2020-08-07 NOTE — ED Notes (Signed)
Pt denies CP and SOB at this time. Pt c/o mild headache.

## 2020-08-07 NOTE — ED Notes (Signed)
Pt assisted to toilet, standby assist 

## 2020-08-07 NOTE — ED Notes (Signed)
Dr Scotty Court notified of troponin 195. No new orders at this time

## 2020-08-07 NOTE — ED Notes (Signed)
Pt c/o dyspnea with ambulation. Mild wheezing noted in lungs bilaterally, respirations even and unlabored. Pt AOX4, NAD noted.

## 2020-08-08 ENCOUNTER — Inpatient Hospital Stay (HOSPITAL_COMMUNITY)
Admit: 2020-08-08 | Discharge: 2020-08-08 | Disposition: A | Discharge status: Home / Self Care | Payer: Medicare Other | Attending: Nurse Practitioner | Admitting: Nurse Practitioner

## 2020-08-08 ENCOUNTER — Other Ambulatory Visit: Payer: Self-pay

## 2020-08-08 DIAGNOSIS — I10 Essential (primary) hypertension: Secondary | ICD-10-CM | POA: Diagnosis not present

## 2020-08-08 DIAGNOSIS — R0602 Shortness of breath: Secondary | ICD-10-CM | POA: Diagnosis not present

## 2020-08-08 DIAGNOSIS — R079 Chest pain, unspecified: Secondary | ICD-10-CM

## 2020-08-08 DIAGNOSIS — I340 Nonrheumatic mitral (valve) insufficiency: Secondary | ICD-10-CM

## 2020-08-08 DIAGNOSIS — I25110 Atherosclerotic heart disease of native coronary artery with unstable angina pectoris: Secondary | ICD-10-CM

## 2020-08-08 DIAGNOSIS — E1159 Type 2 diabetes mellitus with other circulatory complications: Secondary | ICD-10-CM | POA: Diagnosis not present

## 2020-08-08 LAB — CBC
HCT: 37.1 % (ref 36.0–46.0)
Hemoglobin: 12.4 g/dL (ref 12.0–15.0)
MCH: 27.9 pg (ref 26.0–34.0)
MCHC: 33.4 g/dL (ref 30.0–36.0)
MCV: 83.6 fL (ref 80.0–100.0)
Platelets: 268 10*3/uL (ref 150–400)
RBC: 4.44 MIL/uL (ref 3.87–5.11)
RDW: 16.4 % — ABNORMAL HIGH (ref 11.5–15.5)
WBC: 12.2 10*3/uL — ABNORMAL HIGH (ref 4.0–10.5)
nRBC: 0 % (ref 0.0–0.2)

## 2020-08-08 LAB — BASIC METABOLIC PANEL
Anion gap: 6 (ref 5–15)
BUN: 10 mg/dL (ref 8–23)
CO2: 27 mmol/L (ref 22–32)
Calcium: 8.7 mg/dL — ABNORMAL LOW (ref 8.9–10.3)
Chloride: 105 mmol/L (ref 98–111)
Creatinine, Ser: 0.71 mg/dL (ref 0.44–1.00)
GFR, Estimated: >60 mL/min (ref >60)
Glucose, Bld: 114 mg/dL — ABNORMAL HIGH (ref 70–99)
Potassium: 3.9 mmol/L (ref 3.5–5.1)
Sodium: 138 mmol/L (ref 135–145)

## 2020-08-08 LAB — ECHOCARDIOGRAM COMPLETE
AR max vel: 1.69 cm2
AV Area VTI: 1.88 cm2
AV Area mean vel: 1.57 cm2
AV Mean grad: 5.7 mmHg
AV Peak grad: 10.1 mmHg
Ao pk vel: 1.59 m/s
Area-P 1/2: 2.69 cm2
Height: 61 in
S' Lateral: 2.17 cm
Weight: 1843.2 oz

## 2020-08-08 LAB — LIPID PANEL
Cholesterol: 116 mg/dL (ref 0–200)
HDL: 46 mg/dL (ref >40)
LDL Cholesterol: 37 mg/dL (ref 0–99)
Total CHOL/HDL Ratio: 2.5 RATIO
Triglycerides: 164 mg/dL — ABNORMAL HIGH (ref <150)
VLDL: 33 mg/dL (ref 0–40)

## 2020-08-08 LAB — HEPARIN LEVEL (UNFRACTIONATED)
Heparin Unfractionated: 0.18 IU/mL — ABNORMAL LOW (ref 0.30–0.70)
Heparin Unfractionated: 0.18 IU/mL — ABNORMAL LOW (ref 0.30–0.70)

## 2020-08-08 LAB — GLUCOSE, CAPILLARY
Glucose-Capillary: 108 mg/dL — ABNORMAL HIGH (ref 70–99)
Glucose-Capillary: 131 mg/dL — ABNORMAL HIGH (ref 70–99)
Glucose-Capillary: 130 mg/dL — ABNORMAL HIGH (ref 70–99)
Glucose-Capillary: 107 mg/dL — ABNORMAL HIGH (ref 70–99)

## 2020-08-08 MED ORDER — IPRATROPIUM-ALBUTEROL 0.5-2.5 (3) MG/3ML IN SOLN
3.0000 mL | Freq: Four times a day (QID) | RESPIRATORY_TRACT | Status: DC
  Administered 2020-08-08 – 2020-08-10 (×7): 3 mL via RESPIRATORY_TRACT
  Filled 2020-08-08 (×6): qty 3

## 2020-08-08 MED ORDER — HEPARIN BOLUS VIA INFUSION
1200.0000 [IU] | Freq: Once | INTRAVENOUS | Status: AC
  Administered 2020-08-08: 1200 [IU] via INTRAVENOUS
  Filled 2020-08-08: qty 1200

## 2020-08-08 MED ORDER — ISOSORBIDE MONONITRATE ER 30 MG PO TB24
30.0000 mg | ORAL_TABLET | Freq: Every day | ORAL | Status: DC
  Administered 2020-08-08 – 2020-08-10 (×3): 30 mg via ORAL
  Filled 2020-08-08 (×3): qty 1

## 2020-08-08 MED ORDER — IPRATROPIUM-ALBUTEROL 0.5-2.5 (3) MG/3ML IN SOLN: 3.0000 mL | Freq: Four times a day (QID) | RESPIRATORY_TRACT | Status: DC

## 2020-08-08 NOTE — Progress Notes (Signed)
ANTICOAGULATION CONSULT NOTE  Pharmacy Consult for Heparin Indication: chest pain/ACS  Allergies  Allergen Reactions  . Tramadol Itching and Nausea And Vomiting    Patient Measurements: Height: 5\' 1"  (154.9 cm) Weight: 52.3 kg (115 lb 3.2 oz) IBW/kg (Calculated) : 47.8 Heparin Dosing Weight: 54 kg  Vital Signs: Temp: 98.2 F (36.8 C) (10/09 1629) Temp Source: Oral (10/09 1629) BP: 134/65 (10/09 1629) Pulse Rate: 70 (10/09 1629)  Labs: Recent Labs    08/07/20 1135 08/07/20 1135 08/07/20 1337 08/07/20 1619 08/07/20 1903 08/07/20 2139 08/08/20 0556 08/08/20 1713  HGB 13.1  --   --   --   --   --  12.4  --   HCT 40.3  --   --   --   --   --  37.1  --   PLT 275  --   --   --   --   --  268  --   APTT 32  --   --   --   --   --   --   --   LABPROT 13.6  --   --   --   --   --   --   --   INR 1.1  --   --   --   --   --   --   --   HEPARINUNFRC  --   --   --   --   --  <0.10* 0.18* 0.18*  CREATININE 0.63  --   --   --   --   --  0.71  --   TROPONINIHS 195*   < > 187* 169* 138*  --   --   --    < > = values in this interval not displayed.    Estimated Creatinine Clearance: 38.8 mL/min (by C-G formula based on SCr of 0.71 mg/dL).   Medical History: Past Medical History:  Diagnosis Date  . Aortic insufficiency    a. TTE 12/19: EF 55-60%, probable HK of the mid apical anterior septal myocardium, Gr1DD, mild AI, mildly dilated LA  . Asthma   . CAD (coronary artery disease)    a. NSTEMI 12/19; b. LHC 10/08/18: LM minimal luminal irregs, mLAD-1 95% s/p PCI/DES, mLAD-2 60%, LCx mild diffuse disease throughout, RCA minimal luminal irregs; b. 03/2020 MV: EF>65%, no ischemia/scar.  . Diabetes mellitus ([facility])   . Hypercholesterolemia   . Hypertension   . Myocardial infarction ([facility])   . Osteopenia   . Palpitations    a. 04/2020 [facility]: Avg HR 75. 429 SVT episodes, longest 19 secs @ 133. Occas PACs (3.2%). Rare PVCs (<1%).  . Polymyalgia rheumatica syndrome ([facility])   . Reactive  airway disease       Assessment: 86 yo female here with elevated troponin to start on Heparin drip for NSTEMI. No oral anticoagulants noted on PTA med list.   10/8 2139 HL <0.1 - increase drip rate to 850 units/hr 10/9 0556 HL 0.18 - increase drip rate to 950 units/hr 10/9 1713 HL 0.18   Goal of Therapy:  Heparin level 0.3-0.7 units/ml Monitor platelets by anticoagulation protocol: Yes   Plan:  10/9 1713 HL 0.18 subtherapeutic. Heparin 1200 unit bolus followed by increase in heparin drip to 1050 units/hr. Recheck HL in 8 hours. CBC daily.  Pharmacy will continue to follow.   12/9, PharmD, BCPS 08/08/2020 6:14 PM

## 2020-08-08 NOTE — Progress Notes (Signed)
Progress Note  Patient Name: Tanya Cole Date of Encounter: 08/08/2020  [facility] Cardiologist: Lorine Bears, MD   Subjective   Patient NPO this am, will change. NPO Sunday night for cath on Monday. Overall feeling okay. Coughing today, however not worse than normal. No chest pain.   Inpatient Medications    Scheduled Meds:  aspirin EC  81 mg Oral Daily   atorvastatin  80 mg Oral q1800   atorvastatin  80 mg Oral Daily   ferrous sulfate  325 mg Oral Q breakfast   insulin aspart  0-5 Units Subcutaneous QHS   insulin aspart  0-9 Units Subcutaneous TID WC   ipratropium  2 mL Inhalation Q6H   lisinopril  20 mg Oral Daily   metoprolol tartrate  25 mg Oral BID   metoprolol tartrate  25 mg Oral BID   multivitamin-lutein  1 capsule Oral Daily   nitroGLYCERIN  0.5 inch Topical Q6H   Continuous Infusions:  heparin 950 Units/hr (08/08/20 0918)   PRN Meds: acetaminophen, albuterol, dextromethorphan-guaiFENesin, fluticasone, hydrALAZINE, morphine injection, nitroGLYCERIN, ondansetron (ZOFRAN) IV   Vital Signs    Vitals:   08/08/20 0306 08/08/20 0655 08/08/20 0727 08/08/20 0755  BP: 138/66 135/80  126/62  Pulse: 63   (!) 58  Resp: 18     Temp: 98 F (36.7 C)   97.8 F (36.6 C)  TempSrc: Oral   Oral  SpO2: 97%  94% 96%  Weight:      Height:        Intake/Output Summary (Last 24 hours) at 08/08/2020 0925 Last data filed at 08/08/2020 0700 Gross per 24 hour  Intake 728.34 ml  Output 202 ml  Net 526.34 ml   Last 3 Weights 08/08/2020 08/07/2020 08/07/2020  Weight (lbs) 115 lb 3.2 oz 115 lb 12.8 oz 119 lb  Weight (kg) 52.254 kg 52.527 kg 53.978 kg      Telemetry    NSR HR 60, PVCs - Personally Reviewed  ECG    NSR, 63bpm, nonspecific T wave changes  - Personally Reviewed  Physical Exam   GEN: No acute distress.   Neck: No JVD Cardiac: RRR, no murmurs, rubs, or gallops.  Respiratory: mild rhonchii GI: Soft, nontender, non-distended  MS:  No edema; No deformity. Neuro:  Nonfocal  Psych: Normal affect   Labs    High Sensitivity Troponin:   Recent Labs  Lab 08/07/20 1135 08/07/20 1337 08/07/20 1619 08/07/20 1903  TROPONINIHS 195* 187* 169* 138*      Chemistry Recent Labs  Lab 08/07/20 1135 08/08/20 0556  NA 140 138  K 3.8 3.9  CL 105 105  CO2 23 27  GLUCOSE 140* 114*  BUN 9 10  CREATININE 0.63 0.71  CALCIUM 8.9 8.7*  PROT 7.3  --   ALBUMIN 4.2  --   AST 19  --   ALT 12  --   ALKPHOS 64  --   BILITOT 0.8  --   GFRNONAA >60 >60  ANIONGAP 12 6     Hematology Recent Labs  Lab 08/07/20 1135 08/08/20 0556  WBC 12.0* 12.2*  RBC 4.81 4.44  HGB 13.1 12.4  HCT 40.3 37.1  MCV 83.8 83.6  MCH 27.2 27.9  MCHC 32.5 33.4  RDW 16.6* 16.4*  PLT 275 268    BNP Recent Labs  Lab 08/07/20 1135  BNP 259.6*     DDimer No results for input(s): DDIMER in the last 168 hours.   Radiology  DG Chest Portable 1 View  Result Date: 08/07/2020 CLINICAL DATA:  Shortness of breath today. EXAM: PORTABLE CHEST 1 VIEW COMPARISON:  06/08/2020 and 04/06/2020 FINDINGS: Lungs are adequately inflated without acute airspace consolidation or effusion. Calcified granuloma left base. Cardiomediastinal silhouette and remainder of the exam is unchanged. IMPRESSION: No acute cardiopulmonary disease. Electronically Signed   By: Elberta Fortis M.D.   On: 08/07/2020 12:27    Cardiac Studies   Echo today  Cardiac cath  A drug-eluting stent was successfully placed using a STENT RESOLUTE ONYX 2.5X15.  Mid LAD-1 lesion is 95% stenosed.  Post intervention, there is a 0% residual stenosis.  Mid LAD-2 lesion is 60% stenosed.  The left ventricular systolic function is normal.  LV end diastolic pressure is mildly elevated.  The left ventricular ejection fraction is 55-65% by visual estimate.   1.  Severe one-vessel coronary artery disease involving mid LAD. 2.  Normal LV systolic function and mildly elevated left  ventricular end-diastolic pressure. 3.  Successful angioplasty and drug-eluting stent placement to the mid LAD.  Recommendations: Aggressive treatment of risk factors.  I switch simvastatin to atorvastatin. Dual antiplatelet therapy for at least one year. Possible discharge home tomorrow. elevated. The left ventricular ejection fraction is 55-65% by visual estimate. No regional wall motion abnormalities.  Coronary Diagrams  Diagnostic Dominance: Right  Intervention    Echo 09/218 Study Conclusions   - Left ventricle: The cavity size was normal. Wall thickness was  normal. Systolic function was normal. The estimated ejection  fraction was in the range of 55% to 60%. Probable hypokinesis of  the mid-apicalanteroseptal myocardium. Doppler parameters are  consistent with abnormal left ventricular relaxation (grade 1  diastolic dysfunction).  - Aortic valve: There was mild regurgitation.  - Left atrium: The atrium was mildly dilated.  - Pulmonary arteries: Systolic pressure could not be accurately  estimated.   Patient Profile     86 y.o. female with pmh of CAD s/p NSTEMI in 09/2018 s/p LAD DES, HTN, HLD, DM2, palpitations with short runs of PSVT on recent monitoring, diastolic dysfunction, and mild AI who is being seen for chest pain with mild troponin elevation.   Assessment & Plan    UA/elevated troponin with known CAD - prior history of NSTEMI with Des to LAD in 09/2018. Cath also showed moderate residual distal LAD disease - In June 2021 she had a stress test showing no ischemia - Presented with CP, diaphoresis and mild dyspnea - HS troponin 195>187 - EKG with no ischemic changes - started on IV hepain - plan for cath on Monday.  - continue aspirin, statin, BB  HTN - BP stable on BB and lisinopril  HLD - LDL 90 in May 2021 - atorvastatin 80 mg daily  Iron def Anemia - H/H stable  DM2 - metformin held  PSVT - continue BB  For questions or  updates, please contact [facility] Please consult [email or web address] for contact info under        Signed, [place]man David Stall, PA-C  08/08/2020, 9:25 AM

## 2020-08-08 NOTE — Progress Notes (Signed)
Pt was admitted on the floor with no signs of distress. Pt alert and oriented x 4. VSS. Pt was educated about safety and ascom within pt reach. Will continue to monitor.

## 2020-08-08 NOTE — Progress Notes (Signed)
ANTICOAGULATION CONSULT NOTE  Pharmacy Consult for Heparin Indication: chest pain/ACS  Allergies  Allergen Reactions  . Tramadol Itching and Nausea And Vomiting    Patient Measurements: Height: 5\' 1"  (154.9 cm) Weight: 52.3 kg (115 lb 3.2 oz) IBW/kg (Calculated) : 47.8 Heparin Dosing Weight: 54 kg  Vital Signs: Temp: 97.8 F (36.6 C) (10/09 0755) Temp Source: Oral (10/09 0755) BP: 126/62 (10/09 0755) Pulse Rate: 58 (10/09 0755)  Labs: Recent Labs    08/07/20 1135 08/07/20 1135 08/07/20 1337 08/07/20 1619 08/07/20 1903 08/07/20 2139 08/08/20 0556  HGB 13.1  --   --   --   --   --  12.4  HCT 40.3  --   --   --   --   --  37.1  PLT 275  --   --   --   --   --  268  APTT 32  --   --   --   --   --   --   LABPROT 13.6  --   --   --   --   --   --   INR 1.1  --   --   --   --   --   --   HEPARINUNFRC  --   --   --   --   --  <0.10* 0.18*  CREATININE 0.63  --   --   --   --   --  0.71  TROPONINIHS 195*   < > 187* 169* 138*  --   --    < > = values in this interval not displayed.    Estimated Creatinine Clearance: 38.8 mL/min (by C-G formula based on SCr of 0.71 mg/dL).   Medical History: Past Medical History:  Diagnosis Date  . Aortic insufficiency    a. TTE 12/19: EF 55-60%, probable HK of the mid apical anterior septal myocardium, Gr1DD, mild AI, mildly dilated LA  . Asthma   . CAD (coronary artery disease)    a. NSTEMI 12/19; b. LHC 10/08/18: LM minimal luminal irregs, mLAD-1 95% s/p PCI/DES, mLAD-2 60%, LCx mild diffuse disease throughout, RCA minimal luminal irregs; b. 03/2020 MV: EF>65%, no ischemia/scar.  . Diabetes mellitus (HCC)   . Hypercholesterolemia   . Hypertension   . Myocardial infarction (HCC)   . Osteopenia   . Palpitations    a. 04/2020 Zio: Avg HR 75. 429 SVT episodes, longest 19 secs @ 133. Occas PACs (3.2%). Rare PVCs (<1%).  . Polymyalgia rheumatica syndrome (HCC)   . Reactive airway disease       Assessment: 86 yo female here with  elevated troponin to start on Heparin drip for NSTEMI. No oral anticoagulants noted on PTA med list.   Goal of Therapy:  Heparin level 0.3-0.7 units/ml Monitor platelets by anticoagulation protocol: Yes   Plan:  10/9 0556 HL 0.18 subtherapeutic. Heparin 1200 unit bolus followed by increase in heparin drip to 950 units/hr. Recheck HL at 1700. CBC daily.  Pharmacy will continue to follow.   12/9, PharmD 08/08/2020,7:59 AM

## 2020-08-08 NOTE — Progress Notes (Signed)
*  PRELIMINARY RESULTS* Echocardiogram 2D Echocardiogram has been performed.  Cristela Blue 08/08/2020, 8:38 AM

## 2020-08-08 NOTE — Progress Notes (Addendum)
PROGRESS NOTE    [name]  PQD:[id number] DOB: Feb 25, 1933 DOA: 08/07/2020 PCP: Dale Wykoff, MD    Assessment & Plan:   Principal Problem:   NSTEMI (non-ST elevated myocardial infarction) [name]) Active Problems:   Hypertension   Hypercholesterolemia   Diabetes mellitus with cardiac complication (HCC)   Coronary artery disease   Iron deficiency   Asthma   Leukocytosis    Tanya Cole is a 86 y.o. female with medical history significant of CAD, DES placement 2019, HTN, HLD, DM, asthma, PMR, presents with SOB and chest pain.   Unstable angina history of NSTEMI with Des to LAD in 09/2018 --Troponin 195 on presentation, already trended down.  No significant EKG changes.  Started on heparin gtt. Pt is concerned about residual disease noted on prior catheterization, 60% lesion beyond prior LAD stent. Cardiology, Dr. Mariah Milling is consulted. PLAN: --Left heart cath on Monday, per pt preference --d/c IV morphine - prn Nitroglycerin --continue asa, statin and metop  Hypertension -IV hydralazine as needed --continue home metop, lisinopril  --add Imdur 30 daily, per cards  Hypercholesterolemia -Lipitor  Diabetes mellitus with cardiac complication [facility]):  Recent A1c 7.4, poorly controlled.  Patient is taking Metformin at home --Hold metformin while inpatient -Sliding scale insulin  Hx of Iron deficiency anemia: Hemoglobin 13.1 --continue home iron supplement  COPD --presented with dyspnea and audible wheezing.  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  Denied personal hx of smoking. PLAN: --DuoNeb QID  Leukocytosis: WBC 12.0.  No fever.  No source of infection identified, likely reactive -Follow-up by CBC    DVT prophylaxis: NM:MHWKGSU gtt Code Status: DNR  Family Communication: family updated by cardiology about the plan for heart cath Status is: inpatient Dispo:   The patient is from: home Anticipated  d/c is to: home Anticipated d/c date is: Monday Patient currently is not medically stable to d/c due to: pending left heart cath   Subjective and Interval History:  Pt reported no chest pain, dyspnea improved.     Objective: Vitals:   08/08/20 0755 08/08/20 1125 08/08/20 1442 08/08/20 1629  BP: 126/62 (!) 146/63  134/65  Pulse: (!) 58 61  70  Resp:  18    Temp: 97.8 F (36.6 C) 98.3 F (36.8 C)  98.2 F (36.8 C)  TempSrc: Oral Oral  Oral  SpO2: 96% 99% 98% 94%  Weight:      Height:        Intake/Output Summary (Last 24 hours) at 08/08/2020 1745 Last data filed at 08/08/2020 1350 Gross per 24 hour  Intake 515.94 ml  Output 602 ml  Net -86.06 ml   Filed Weights   08/07/20 1129 08/07/20 2112 08/08/20 0304  Weight: 54 kg 52.5 kg 52.3 kg    Examination:   Constitutional: NAD, AAOx3 HEENT: conjunctivae and lids normal, EOMI CV: RRR no M,R,G. Distal pulses +2.  No cyanosis.   RESP: Diffuse expiratory wheezes, normal respiratory effort  GI: +BS, NTND Extremities: No effusions, edema, or tenderness in BLE SKIN: warm, dry and intact Neuro: II - XII grossly intact.  Sensation intact Psych: Normal mood and affect.     Data Reviewed: I have personally reviewed following labs and imaging studies  CBC: Recent Labs  Lab 08/07/20 1135 08/08/20 0556  WBC 12.0* 12.2*  NEUTROABS 8.0*  --   HGB 13.1 12.4  HCT 40.3 37.1  MCV 83.8 83.6  PLT 275 268   Basic  Metabolic Panel: Recent Labs  Lab 08/07/20 1135 08/08/20 0556  NA 140 138  K 3.8 3.9  CL 105 105  CO2 23 27  GLUCOSE 140* 114*  BUN 9 10  CREATININE 0.63 0.71  CALCIUM 8.9 8.7*   GFR: Estimated Creatinine Clearance: 38.8 mL/min (by C-G formula based on SCr of 0.71 mg/dL). Liver Function Tests: Recent Labs  Lab 08/07/20 1135  AST 19  ALT 12  ALKPHOS 64  BILITOT 0.8  PROT 7.3  ALBUMIN 4.2   Recent Labs  Lab 08/07/20 1135  LIPASE 23   No results for input(s): AMMONIA in the last 168  hours. Coagulation Profile: Recent Labs  Lab 08/07/20 1135  INR 1.1   Cardiac Enzymes: No results for input(s): CKTOTAL, CKMB, CKMBINDEX, TROPONINI in the last 168 hours. BNP (last 3 results) No results for input(s): PROBNP in the last 8760 hours. HbA1C: Recent Labs    08/07/20 1337  HGBA1C 6.7*   CBG: Recent Labs  Lab 08/07/20 1623 08/07/20 2108 08/08/20 0827 08/08/20 1124 08/08/20 1629  GLUCAP 89 116* 108* 131* 130*   Lipid Profile: Recent Labs    08/08/20 0556  CHOL 116  HDL 46  LDLCALC 37  TRIG 164*  CHOLHDL 2.5   Thyroid Function Tests: No results for input(s): TSH, T4TOTAL, FREET4, T3FREE, THYROIDAB in the last 72 hours. Anemia Panel: No results for input(s): VITAMINB12, FOLATE, FERRITIN, TIBC, IRON, RETICCTPCT in the last 72 hours. Sepsis Labs: No results for input(s): PROCALCITON, LATICACIDVEN in the last 168 hours.  Recent Results (from the past 240 hour(s))  Respiratory Panel by RT PCR (Flu A&B, Covid) - Nasopharyngeal Swab     Status: None   Collection Time: 08/07/20 11:43 AM   Specimen: Nasopharyngeal Swab  Result Value Ref Range Status   SARS Coronavirus 2 by RT PCR NEGATIVE NEGATIVE Final    Comment: (NOTE) SARS-CoV-2 target nucleic acids are NOT DETECTED.  The SARS-CoV-2 RNA is generally detectable in upper respiratoy specimens during the acute phase of infection. The lowest concentration of SARS-CoV-2 viral copies this assay can detect is 131 copies/mL. A negative result does not preclude SARS-Cov-2 infection and should not be used as the sole basis for treatment or other patient management decisions. A negative result may occur with  improper specimen collection/handling, submission of specimen other than nasopharyngeal swab, presence of viral mutation(s) within the areas targeted by this assay, and inadequate number of viral copies (<131 copies/mL). A negative result must be combined with clinical observations, patient history, and  epidemiological information. The expected result is Negative.  Fact Sheet for Patients:  https://www.moore.com/  Fact Sheet for Healthcare Providers:  https://www.young.biz/  This test is no t yet approved or cleared by the [place] FDA and  has been authorized for detection and/or diagnosis of SARS-CoV-2 by FDA under an Emergency Use Authorization (EUA). This EUA will remain  in effect (meaning this test can be used) for the duration of the COVID-19 declaration under Section 564(b)(1) of the Act, 21 U.S.C. section 360bbb-3(b)(1), unless the authorization is terminated or revoked sooner.     Influenza A by PCR NEGATIVE NEGATIVE Final   Influenza B by PCR NEGATIVE NEGATIVE Final    Comment: (NOTE) The Xpert Xpress SARS-CoV-2/FLU/RSV assay is intended as an aid in  the diagnosis of influenza from Nasopharyngeal swab specimens and  should not be used as a sole basis for treatment. Nasal washings and  aspirates are unacceptable for Xpert Xpress SARS-CoV-2/FLU/RSV  testing.  Fact Sheet  for Patients: https://www.moore.com/  Fact Sheet for Healthcare Providers: https://www.young.biz/  This test is not yet approved or cleared by the [place] FDA and  has been authorized for detection and/or diagnosis of SARS-CoV-2 by  FDA under an Emergency Use Authorization (EUA). This EUA will remain  in effect (meaning this test can be used) for the duration of the  Covid-19 declaration under Section 564(b)(1) of the Act, 21  U.S.C. section 360bbb-3(b)(1), unless the authorization is  terminated or revoked. Performed at [facility], [address] [place], [place] [address]       Radiology Studies: DG Chest Portable 1 View  Result Date: 08/07/2020 CLINICAL DATA:  Shortness of breath today. EXAM: PORTABLE CHEST 1 VIEW COMPARISON:  06/08/2020 and 04/06/2020 FINDINGS: Lungs are adequately  inflated without acute airspace consolidation or effusion. Calcified granuloma left base. Cardiomediastinal silhouette and remainder of the exam is unchanged. IMPRESSION: No acute cardiopulmonary disease. Electronically Signed   By: Elberta Fortis M.D.   On: 08/07/2020 12:27   ECHOCARDIOGRAM COMPLETE  Result Date: 08/08/2020    ECHOCARDIOGRAM REPORT   Patient Name:   SAMYIAH HALVORSEN [facility] Date of Exam: 08/08/2020 Medical Rec #:  [id number]          Height:       61.0 in Accession #:    [id number]         Weight:       115.2 lb Date of Birth:  02-Dec-1932          BSA:          1.494 m Patient Age:    87 years           BP:           135/80 mmHg Patient Gender: F                  HR:           63 bpm. Exam Location:  [facility] Procedure: 2D Echo, Cardiac Doppler and Color Doppler Indications:     Chest pain 786.50  History:         Patient has prior history of Echocardiogram examinations, most                  recent 10/08/2018. Previous Myocardial Infarction; Risk                  Factors:Hypertension and Diabetes.  Sonographer:     Cristela Blue RDCS (AE) Referring Phys:  [id number] [facility] Diagnosing Phys: Julien Nordmann MD  Sonographer Comments: Suboptimal apical window. IMPRESSIONS  1. Left ventricular ejection fraction, by estimation, is 60 to 65%. The left ventricle has normal function. The left ventricle has no regional wall motion abnormalities. Left ventricular diastolic parameters are consistent with Grade II diastolic dysfunction (pseudonormalization).  2. Right ventricular systolic function is normal. The right ventricular size is normal. Tricuspid regurgitation signal is inadequate for assessing PA pressure.  3. Left atrial size was mildly dilated.  4. Mild mitral valve regurgitation. FINDINGS  Left Ventricle: Left ventricular ejection fraction, by estimation, is 60 to 65%. The left ventricle has normal function. The left ventricle has no regional wall motion abnormalities. The left ventricular internal  cavity size was normal in size. There is  no left ventricular hypertrophy. Left ventricular diastolic parameters are consistent with Grade II diastolic dysfunction (pseudonormalization). Right Ventricle: The right ventricular size is normal. No increase in right ventricular wall thickness. Right ventricular systolic function is  normal. Tricuspid regurgitation signal is inadequate for assessing PA pressure. Left Atrium: Left atrial size was mildly dilated. Right Atrium: Right atrial size was normal in size. Pericardium: There is no evidence of pericardial effusion. Mitral Valve: The mitral valve is normal in structure. Mild mitral valve regurgitation. No evidence of mitral valve stenosis. Tricuspid Valve: The tricuspid valve is normal in structure. Tricuspid valve regurgitation is not demonstrated. No evidence of tricuspid stenosis. Aortic Valve: The aortic valve was not well visualized. Aortic valve regurgitation is not visualized. Mild to moderate aortic valve sclerosis/calcification is present, without any evidence of aortic stenosis. Aortic valve mean gradient measures 5.7 mmHg.  Aortic valve peak gradient measures 10.1 mmHg. Aortic valve area, by VTI measures 1.88 cm. Pulmonic Valve: The pulmonic valve was normal in structure. Pulmonic valve regurgitation is not visualized. No evidence of pulmonic stenosis. Aorta: The aortic root is normal in size and structure. Venous: The inferior vena cava is normal in size with greater than 50% respiratory variability, suggesting right atrial pressure of 3 mmHg. IAS/Shunts: No atrial level shunt detected by color flow Doppler.  LEFT VENTRICLE PLAX 2D LVIDd:         4.45 cm  Diastology LVIDs:         2.17 cm  LV e' medial:    5.22 cm/s LV PW:         1.02 cm  LV E/e' medial:  21.8 LV IVS:        0.91 cm  LV e' lateral:   6.31 cm/s LVOT diam:     2.00 cm  LV E/e' lateral: 18.1 LV SV:         64 LV SV Index:   43 LVOT Area:     3.14 cm  RIGHT VENTRICLE RV Basal diam:  3.21 cm  LEFT ATRIUM             Index       RIGHT ATRIUM           Index LA diam:        4.20 cm 2.81 cm/m  RA Area:     10.80 cm LA Vol (A2C):   68.9 ml 46.12 ml/m RA Volume:   20.40 ml  13.65 ml/m LA Vol (A4C):   52.0 ml 34.81 ml/m LA Biplane Vol: 63.1 ml 42.24 ml/m  AORTIC VALVE                    PULMONIC VALVE AV Area (Vmax):    1.69 cm     PV Vmax:        0.79 m/s AV Area (Vmean):   1.57 cm     PV Peak grad:   2.5 mmHg AV Area (VTI):     1.88 cm     RVOT Peak grad: 3 mmHg AV Vmax:           158.67 cm/s AV Vmean:          110.333 cm/s AV VTI:            0.340 m AV Peak Grad:      10.1 mmHg AV Mean Grad:      5.7 mmHg LVOT Vmax:         85.50 cm/s LVOT Vmean:        55.300 cm/s LVOT VTI:          0.203 m LVOT/AV VTI ratio: 0.60  AORTA Ao Root diam: 2.70 cm MITRAL VALVE MV Area (PHT): 2.69 cm  SHUNTS MV Decel Time: 282 msec     Systemic VTI:  0.20 m MV E velocity: 114.00 cm/s  Systemic Diam: 2.00 cm MV A velocity: 105.00 cm/s MV E/A ratio:  1.09 Julien Nordmann MD Electronically signed by Julien Nordmann MD Signature Date/Time: 08/08/2020/2:41:31 PM    Final      Scheduled Meds: . aspirin EC  81 mg Oral Daily  . atorvastatin  80 mg Oral Daily  . ferrous sulfate  325 mg Oral Q breakfast  . insulin aspart  0-5 Units Subcutaneous QHS  . insulin aspart  0-9 Units Subcutaneous TID WC  . ipratropium  2 mL Inhalation Q6H  . isosorbide mononitrate  30 mg Oral Daily  . lisinopril  20 mg Oral Daily  . metoprolol tartrate  25 mg Oral BID  . multivitamin-lutein  1 capsule Oral Daily   Continuous Infusions: . heparin 950 Units/hr (08/08/20 1447)     LOS: 1 day     Darlin Priestly, MD [facility] If 7PM-7AM, please contact night-coverage 08/08/2020, 5:45 PM

## 2020-08-08 NOTE — Plan of Care (Signed)
  Problem: Education: Goal: Knowledge of General Education information will improve Description: Including pain rating scale, medication(s)/side effects and non-pharmacologic comfort measures Outcome: Progressing   Problem: Clinical Measurements: Goal: Respiratory complications will improve Outcome: Progressing Goal: Cardiovascular complication will be avoided Outcome: Progressing   Problem: Pain Managment: Goal: General experience of comfort will improve Outcome: Progressing   Problem: Safety: Goal: Ability to remain free from injury will improve Outcome: Progressing   

## 2020-08-08 NOTE — Progress Notes (Addendum)
Pt was complaining of SOB. Pt oxygen was at 92 % on RA. Pt was place on 1 liters oxygen for comfort. Will notify incoming shift. Will continue to monitor

## 2020-08-09 DIAGNOSIS — I10 Essential (primary) hypertension: Secondary | ICD-10-CM | POA: Diagnosis not present

## 2020-08-09 DIAGNOSIS — R0602 Shortness of breath: Secondary | ICD-10-CM | POA: Diagnosis not present

## 2020-08-09 DIAGNOSIS — I25110 Atherosclerotic heart disease of native coronary artery with unstable angina pectoris: Secondary | ICD-10-CM | POA: Diagnosis not present

## 2020-08-09 DIAGNOSIS — E1159 Type 2 diabetes mellitus with other circulatory complications: Secondary | ICD-10-CM | POA: Diagnosis not present

## 2020-08-09 LAB — HEPARIN LEVEL (UNFRACTIONATED)
Heparin Unfractionated: 0.44 IU/mL (ref 0.30–0.70)
Heparin Unfractionated: 0.33 IU/mL (ref 0.30–0.70)

## 2020-08-09 LAB — GLUCOSE, CAPILLARY
Glucose-Capillary: 122 mg/dL — ABNORMAL HIGH (ref 70–99)
Glucose-Capillary: 130 mg/dL — ABNORMAL HIGH (ref 70–99)
Glucose-Capillary: 120 mg/dL — ABNORMAL HIGH (ref 70–99)
Glucose-Capillary: 156 mg/dL — ABNORMAL HIGH (ref 70–99)

## 2020-08-09 LAB — CBC
HCT: 37 % (ref 36.0–46.0)
Hemoglobin: 12.2 g/dL (ref 12.0–15.0)
MCH: 27.9 pg (ref 26.0–34.0)
MCHC: 33 g/dL (ref 30.0–36.0)
MCV: 84.5 fL (ref 80.0–100.0)
Platelets: 276 10*3/uL (ref 150–400)
RBC: 4.38 MIL/uL (ref 3.87–5.11)
RDW: 16.5 % — ABNORMAL HIGH (ref 11.5–15.5)
WBC: 13.1 10*3/uL — ABNORMAL HIGH (ref 4.0–10.5)
nRBC: 0 % (ref 0.0–0.2)

## 2020-08-09 LAB — BASIC METABOLIC PANEL
Anion gap: 11 (ref 5–15)
BUN: 9 mg/dL (ref 8–23)
CO2: 25 mmol/L (ref 22–32)
Calcium: 8.7 mg/dL — ABNORMAL LOW (ref 8.9–10.3)
Chloride: 104 mmol/L (ref 98–111)
Creatinine, Ser: 0.68 mg/dL (ref 0.44–1.00)
GFR, Estimated: >60 mL/min (ref >60)
Glucose, Bld: 123 mg/dL — ABNORMAL HIGH (ref 70–99)
Potassium: 3.8 mmol/L (ref 3.5–5.1)
Sodium: 140 mmol/L (ref 135–145)

## 2020-08-09 LAB — MAGNESIUM: Magnesium: 1.7 mg/dL (ref 1.7–2.4)

## 2020-08-09 NOTE — Progress Notes (Signed)
ANTICOAGULATION CONSULT NOTE  Pharmacy Consult for Heparin Indication: chest pain/ACS  Allergies  Allergen Reactions  . Tramadol Itching and Nausea And Vomiting    Patient Measurements: Height: 5\' 1"  (154.9 cm) Weight: 51.8 kg (114 lb 4.8 oz) IBW/kg (Calculated) : 47.8 Heparin Dosing Weight: 54 kg  Vital Signs: Temp: 98.6 F (37 C) (10/10 1056) Temp Source: Oral (10/10 1056) BP: 128/60 (10/10 1056) Pulse Rate: 71 (10/10 1056)  Labs: Recent Labs     0000 08/07/20 1135 08/07/20 1135 08/07/20 1337 08/07/20 1619 08/07/20 1903 08/07/20 2139 08/08/20 0556 08/08/20 0556 08/08/20 1713 08/09/20 0220 08/09/20 1006  HGB   < > 13.1  --   --   --   --   --  12.4  --   --  12.2  --   HCT  --  40.3  --   --   --   --   --  37.1  --   --  37.0  --   PLT  --  275  --   --   --   --   --  268  --   --  276  --   APTT  --  32  --   --   --   --   --   --   --   --   --   --   LABPROT  --  13.6  --   --   --   --   --   --   --   --   --   --   INR  --  1.1  --   --   --   --   --   --   --   --   --   --   HEPARINUNFRC  --   --   --   --   --   --    < > 0.18*   < > 0.18* 0.44 0.33  CREATININE  --  0.63  --   --   --   --   --  0.71  --   --  0.68  --   TROPONINIHS  --  195*   < > 187* 169* 138*  --   --   --   --   --   --    < > = values in this interval not displayed.    Estimated Creatinine Clearance: 38.8 mL/min (by C-G formula based on SCr of 0.68 mg/dL).   Medical History: Past Medical History:  Diagnosis Date  . Aortic insufficiency    a. TTE 12/19: EF 55-60%, probable HK of the mid apical anterior septal myocardium, Gr1DD, mild AI, mildly dilated LA  . Asthma   . CAD (coronary artery disease)    a. NSTEMI 12/19; b. LHC 10/08/18: LM minimal luminal irregs, mLAD-1 95% s/p PCI/DES, mLAD-2 60%, LCx mild diffuse disease throughout, RCA minimal luminal irregs; b. 03/2020 MV: EF>65%, no ischemia/scar.  . Diabetes mellitus (HCC)   . Hypercholesterolemia   . Hypertension    . Myocardial infarction (HCC)   . Osteopenia   . Palpitations    a. 04/2020 Zio: Avg HR 75. 429 SVT episodes, longest 19 secs @ 133. Occas PACs (3.2%). Rare PVCs (<1%).  . Polymyalgia rheumatica syndrome (HCC)   . Reactive airway disease       Assessment: 86 yo female here with elevated troponin to start on Heparin drip for NSTEMI. No oral  anticoagulants noted on PTA med list.   10/8 2139 HL <0.1 - increase drip rate to 850 units/hr 10/9 0556 HL 0.18 - increase drip rate to 950 units/hr 10/9 1713 HL 0.18 - 1200 unit bolus, increase to 1050 units/hr 10/10 0220 HL 0.44 therapeutic x 1 10/10 1006 HL 0.33 therapeutic x 2    Goal of Therapy:  Heparin level 0.3-0.7 units/ml Monitor platelets by anticoagulation protocol: Yes   Plan:  Continue heparin at 1050 units/hr. HL and CBC with morning labs.  Pharmacy will continue to follow.   Pricilla Riffle, PharmD 08/09/2020 11:12 AM

## 2020-08-09 NOTE — Progress Notes (Signed)
ANTICOAGULATION CONSULT NOTE  Pharmacy Consult for Heparin Indication: chest pain/ACS  Allergies  Allergen Reactions  . Tramadol Itching and Nausea And Vomiting    Patient Measurements: Height: 5\' 1"  (154.9 cm) Weight: 51.8 kg (114 lb 4.8 oz) IBW/kg (Calculated) : 47.8 Heparin Dosing Weight: 54 kg  Vital Signs: Temp: 98.8 F (37.1 C) (10/10 0021) Temp Source: Oral (10/10 0021) BP: 114/58 (10/09 2006) Pulse Rate: 82 (10/09 2006)  Labs: Recent Labs     0000 08/07/20 1135 08/07/20 1135 08/07/20 1337 08/07/20 1619 08/07/20 1903 08/07/20 2139 08/08/20 0556 08/08/20 1713 08/09/20 0220  HGB   < > 13.1  --   --   --   --   --  12.4  --  12.2  HCT  --  40.3  --   --   --   --   --  37.1  --  37.0  PLT  --  275  --   --   --   --   --  268  --  276  APTT  --  32  --   --   --   --   --   --   --   --   LABPROT  --  13.6  --   --   --   --   --   --   --   --   INR  --  1.1  --   --   --   --   --   --   --   --   HEPARINUNFRC  --   --   --   --   --   --    < > 0.18* 0.18* 0.44  CREATININE  --  0.63  --   --   --   --   --  0.71  --  0.68  TROPONINIHS  --  195*   < > 187* 169* 138*  --   --   --   --    < > = values in this interval not displayed.    Estimated Creatinine Clearance: 38.8 mL/min (by C-G formula based on SCr of 0.68 mg/dL).   Medical History: Past Medical History:  Diagnosis Date  . Aortic insufficiency    a. TTE 12/19: EF 55-60%, probable HK of the mid apical anterior septal myocardium, Gr1DD, mild AI, mildly dilated LA  . Asthma   . CAD (coronary artery disease)    a. NSTEMI 12/19; b. LHC 10/08/18: LM minimal luminal irregs, mLAD-1 95% s/p PCI/DES, mLAD-2 60%, LCx mild diffuse disease throughout, RCA minimal luminal irregs; b. 03/2020 MV: EF>65%, no ischemia/scar.  . Diabetes mellitus (HCC)   . Hypercholesterolemia   . Hypertension   . Myocardial infarction (HCC)   . Osteopenia   . Palpitations    a. 04/2020 Zio: Avg HR 75. 429 SVT episodes, longest  19 secs @ 133. Occas PACs (3.2%). Rare PVCs (<1%).  . Polymyalgia rheumatica syndrome (HCC)   . Reactive airway disease       Assessment: 86 yo female here with elevated troponin to start on Heparin drip for NSTEMI. No oral anticoagulants noted on PTA med list.   10/8 2139 HL <0.1 - increase drip rate to 850 units/hr 10/9 0556 HL 0.18 - increase drip rate to 950 units/hr 10/9 1713 HL 0.18   Goal of Therapy:  Heparin level 0.3-0.7 units/ml Monitor platelets by anticoagulation protocol: Yes   Plan:  10/9 1713 HL  0.18 subtherapeutic. Heparin 1200 unit bolus followed by increase in heparin drip to 1050 units/hr. Recheck HL in 8 hours. CBC daily.  10/10:  HL @ 0220 = 0.44 Will continue pt on current rate and recheck HL in 8 hrs.   Pharmacy will continue to follow.   Janeliz Prestwood D 08/09/2020 3:20 AM

## 2020-08-09 NOTE — H&P (View-Only) (Signed)
Progress Note  Patient Name: Tanya Cole Date of Encounter: 08/09/2020  [facility] Cardiologist: Lorine Bears, MD   Subjective   Reports doing well, denies any chest pain Continues on heparin infusion Again reiterated she would like to pursue cardiac catheterization tomorrow given recent unstable angina symptoms on admission  Inpatient Medications    Scheduled Meds: . aspirin EC  81 mg Oral Daily  . atorvastatin  80 mg Oral Daily  . ferrous sulfate  325 mg Oral Q breakfast  . insulin aspart  0-5 Units Subcutaneous QHS  . insulin aspart  0-9 Units Subcutaneous TID WC  . ipratropium-albuterol  3 mL Nebulization QID  . isosorbide mononitrate  30 mg Oral Daily  . lisinopril  20 mg Oral Daily  . metoprolol tartrate  25 mg Oral BID  . multivitamin-lutein  1 capsule Oral Daily   Continuous Infusions: . heparin 1,050 Units/hr (08/08/20 1826)   PRN Meds: acetaminophen, albuterol, dextromethorphan-guaiFENesin, fluticasone, hydrALAZINE, nitroGLYCERIN, ondansetron (ZOFRAN) IV   Vital Signs    Vitals:   08/09/20 0500 08/09/20 0653 08/09/20 0732 08/09/20 1056  BP:  (!) 113/53 (!) 103/46 128/60  Pulse:  74 65 71  Resp:  18 16   Temp:  99 F (37.2 C) 98.5 F (36.9 C) 98.6 F (37 C)  TempSrc:  Oral Oral Oral  SpO2:  93% 97% 96%  Weight: 51.8 kg     Height:        Intake/Output Summary (Last 24 hours) at 08/09/2020 1403 Last data filed at 08/09/2020 1351 Gross per 24 hour  Intake 595.65 ml  Output 500 ml  Net 95.65 ml   Last 3 Weights 08/09/2020 08/09/2020 08/08/2020  Weight (lbs) 114 lb 4.8 oz 114 lb 4.8 oz 115 lb 3.2 oz  Weight (kg) 51.846 kg 51.846 kg 52.254 kg      Telemetry    Normal sinus rhythm- Personally Reviewed  ECG     - Personally Reviewed  Physical Exam   GEN: No acute distress.   Neck: No JVD Cardiac: RRR, no murmurs, rubs, or gallops.  Respiratory:  Wheezing, scattered Rales GI: Soft, nontender, non-distended  MS: No edema; No  deformity. Neuro:  Nonfocal  Psych: Normal affect   Labs    High Sensitivity Troponin:   Recent Labs  Lab 08/07/20 1135 08/07/20 1337 08/07/20 1619 08/07/20 1903  TROPONINIHS 195* 187* 169* 138*      Chemistry Recent Labs  Lab 08/07/20 1135 08/08/20 0556 08/09/20 0220  NA 140 138 140  K 3.8 3.9 3.8  CL 105 105 104  CO2 23 27 25   GLUCOSE 140* 114* 123*  BUN 9 10 9   CREATININE 0.63 0.71 0.68  CALCIUM 8.9 8.7* 8.7*  PROT 7.3  --   --   ALBUMIN 4.2  --   --   AST 19  --   --   ALT 12  --   --   ALKPHOS 64  --   --   BILITOT 0.8  --   --   GFRNONAA >60 >60 >60  ANIONGAP 12 6 11      Hematology Recent Labs  Lab 08/07/20 1135 08/08/20 0556 08/09/20 0220  WBC 12.0* 12.2* 13.1*  RBC 4.81 4.44 4.38  HGB 13.1 12.4 12.2  HCT 40.3 37.1 37.0  MCV 83.8 83.6 84.5  MCH 27.2 27.9 27.9  MCHC 32.5 33.4 33.0  RDW 16.6* 16.4* 16.5*  PLT 275 268 276    BNP Recent Labs  Lab 08/07/20  1135  BNP 259.6*     DDimer No results for input(s): DDIMER in the last 168 hours.   Radiology    ECHOCARDIOGRAM COMPLETE  Result Date: 08/08/2020    ECHOCARDIOGRAM REPORT   Patient Name:   [facility] Date of Exam: 08/08/2020 Medical Rec #:  [id number]          Height:       61.0 in Accession #:    [id number]         Weight:       115.2 lb Date of Birth:  Apr 20, 1933          BSA:          1.494 m Patient Age:    87 years           BP:           135/80 mmHg Patient Gender: F                  HR:           63 bpm. Exam Location:  [facility] Procedure: 2D Echo, Cardiac Doppler and Color Doppler Indications:     Chest pain 786.50  History:         Patient has prior history of Echocardiogram examinations, most                  recent 10/08/2018. Previous Myocardial Infarction; Risk                  Factors:Hypertension and Diabetes.  Sonographer:     Cristela Blue RDCS (AE) Referring Phys:  [id number] [facility] Diagnosing Phys: Julien Nordmann MD  Sonographer Comments: Suboptimal apical window.  IMPRESSIONS  1. Left ventricular ejection fraction, by estimation, is 60 to 65%. The left ventricle has normal function. The left ventricle has no regional wall motion abnormalities. Left ventricular diastolic parameters are consistent with Grade II diastolic dysfunction (pseudonormalization).  2. Right ventricular systolic function is normal. The right ventricular size is normal. Tricuspid regurgitation signal is inadequate for assessing PA pressure.  3. Left atrial size was mildly dilated.  4. Mild mitral valve regurgitation. FINDINGS  Left Ventricle: Left ventricular ejection fraction, by estimation, is 60 to 65%. The left ventricle has normal function. The left ventricle has no regional wall motion abnormalities. The left ventricular internal cavity size was normal in size. There is  no left ventricular hypertrophy. Left ventricular diastolic parameters are consistent with Grade II diastolic dysfunction (pseudonormalization). Right Ventricle: The right ventricular size is normal. No increase in right ventricular wall thickness. Right ventricular systolic function is normal. Tricuspid regurgitation signal is inadequate for assessing PA pressure. Left Atrium: Left atrial size was mildly dilated. Right Atrium: Right atrial size was normal in size. Pericardium: There is no evidence of pericardial effusion. Mitral Valve: The mitral valve is normal in structure. Mild mitral valve regurgitation. No evidence of mitral valve stenosis. Tricuspid Valve: The tricuspid valve is normal in structure. Tricuspid valve regurgitation is not demonstrated. No evidence of tricuspid stenosis. Aortic Valve: The aortic valve was not well visualized. Aortic valve regurgitation is not visualized. Mild to moderate aortic valve sclerosis/calcification is present, without any evidence of aortic stenosis. Aortic valve mean gradient measures 5.7 mmHg.  Aortic valve peak gradient measures 10.1 mmHg. Aortic valve area, by VTI measures 1.88 cm.  Pulmonic Valve: The pulmonic valve was normal in structure. Pulmonic valve regurgitation is not visualized. No evidence of pulmonic stenosis. Aorta: The aortic root  is normal in size and structure. Venous: The inferior vena cava is normal in size with greater than 50% respiratory variability, suggesting right atrial pressure of 3 mmHg. IAS/Shunts: No atrial level shunt detected by color flow Doppler.  LEFT VENTRICLE PLAX 2D LVIDd:         4.45 cm  Diastology LVIDs:         2.17 cm  LV e' medial:    5.22 cm/s LV PW:         1.02 cm  LV E/e' medial:  21.8 LV IVS:        0.91 cm  LV e' lateral:   6.31 cm/s LVOT diam:     2.00 cm  LV E/e' lateral: 18.1 LV SV:         64 LV SV Index:   43 LVOT Area:     3.14 cm  RIGHT VENTRICLE RV Basal diam:  3.21 cm LEFT ATRIUM             Index       RIGHT ATRIUM           Index LA diam:        4.20 cm 2.81 cm/m  RA Area:     10.80 cm LA Vol (A2C):   68.9 ml 46.12 ml/m RA Volume:   20.40 ml  13.65 ml/m LA Vol (A4C):   52.0 ml 34.81 ml/m LA Biplane Vol: 63.1 ml 42.24 ml/m  AORTIC VALVE                    PULMONIC VALVE AV Area (Vmax):    1.69 cm     PV Vmax:        0.79 m/s AV Area (Vmean):   1.57 cm     PV Peak grad:   2.5 mmHg AV Area (VTI):     1.88 cm     RVOT Peak grad: 3 mmHg AV Vmax:           158.67 cm/s AV Vmean:          110.333 cm/s AV VTI:            0.340 m AV Peak Grad:      10.1 mmHg AV Mean Grad:      5.7 mmHg LVOT Vmax:         85.50 cm/s LVOT Vmean:        55.300 cm/s LVOT VTI:          0.203 m LVOT/AV VTI ratio: 0.60  AORTA Ao Root diam: 2.70 cm MITRAL VALVE MV Area (PHT): 2.69 cm     SHUNTS MV Decel Time: 282 msec     Systemic VTI:  0.20 m MV E velocity: 114.00 cm/s  Systemic Diam: 2.00 cm MV A velocity: 105.00 cm/s MV E/A ratio:  1.09 Julien Nordmann MD Electronically signed by Julien Nordmann MD Signature Date/Time: 08/08/2020/2:41:31 PM    Final     Cardiac Studies   Echocardiogram yesterday Normal ejection fraction, diastolic dysfunction, No  significant valve disease  Patient Profile     86 y.o. female with pmh of CAD s/p NSTEMI in 09/2018 s/p LAD DES, HTN, HLD, DM2, palpitations with short runs of PSVT on recent monitoring, diastolic dysfunction, and mild AI who is being seen for chest pain with mild troponin elevation/non-STEMI  Assessment & Plan    Unstable angina Diaphoretic October 7 with chest pain, recurrent severe symptoms morning of October 8, chest pain Elevated troponin  195 on arrival trending downward She declined stress testing, preferred cardiac catheterization, family also urging catheterization -Prior catheterization reviewed, prior LAD stent with 60% lesion beyond prior LAD stent --Orders placed, placed on the schedule first case tomorrow n.p.o. after midnight  Essential hypertension Continue metoprolol, lisinopril,  Given blood pressure was elevated since arrival, she was started on Imdur 30 daily yesterday, will continue to monitor  Hyperlipidemia Continue high intensity statin Goal LDL less than 70  COPD Chronic cough, appears to have chronic bronchitis with underlying lung disease Uses inhalers and nebulizers at home This admission with some cough, consistent with chronic bronchitis   Total encounter time more than 25 minutes  Greater than 50% was spent in counseling and coordination of care with the patient    For questions or updates, please contact [facility] Please consult [email or web address] for contact info under        Signed, Julien Nordmann, MD  08/09/2020, 2:03 PM

## 2020-08-09 NOTE — Plan of Care (Signed)
  Problem: Clinical Measurements: Goal: Respiratory complications will improve Outcome: Progressing Goal: Cardiovascular complication will be avoided Outcome: Progressing   Problem: Pain Managment: Goal: General experience of comfort will improve Outcome: Progressing   Problem: Safety: Goal: Ability to remain free from injury will improve Outcome: Progressing   

## 2020-08-09 NOTE — Progress Notes (Addendum)
Progress Note  Patient Name: Tanya Cole Date of Encounter: 08/09/2020  [facility] Cardiologist: Lorine Bears, MD   Subjective   Reports doing well, denies any chest pain Continues on heparin infusion Again reiterated she would like to pursue cardiac catheterization tomorrow given recent unstable angina symptoms on admission  Inpatient Medications    Scheduled Meds: . aspirin EC  81 mg Oral Daily  . atorvastatin  80 mg Oral Daily  . ferrous sulfate  325 mg Oral Q breakfast  . insulin aspart  0-5 Units Subcutaneous QHS  . insulin aspart  0-9 Units Subcutaneous TID WC  . ipratropium-albuterol  3 mL Nebulization QID  . isosorbide mononitrate  30 mg Oral Daily  . lisinopril  20 mg Oral Daily  . metoprolol tartrate  25 mg Oral BID  . multivitamin-lutein  1 capsule Oral Daily   Continuous Infusions: . heparin 1,050 Units/hr (08/08/20 1826)   PRN Meds: acetaminophen, albuterol, dextromethorphan-guaiFENesin, fluticasone, hydrALAZINE, nitroGLYCERIN, ondansetron (ZOFRAN) IV   Vital Signs    Vitals:   08/09/20 0500 08/09/20 0653 08/09/20 0732 08/09/20 1056  BP:  (!) 113/53 (!) 103/46 128/60  Pulse:  74 65 71  Resp:  18 16   Temp:  99 F (37.2 C) 98.5 F (36.9 C) 98.6 F (37 C)  TempSrc:  Oral Oral Oral  SpO2:  93% 97% 96%  Weight: 51.8 kg     Height:        Intake/Output Summary (Last 24 hours) at 08/09/2020 1403 Last data filed at 08/09/2020 1351 Gross per 24 hour  Intake 595.65 ml  Output 500 ml  Net 95.65 ml   Last 3 Weights 08/09/2020 08/09/2020 08/08/2020  Weight (lbs) 114 lb 4.8 oz 114 lb 4.8 oz 115 lb 3.2 oz  Weight (kg) 51.846 kg 51.846 kg 52.254 kg      Telemetry    Normal sinus rhythm- Personally Reviewed  ECG     - Personally Reviewed  Physical Exam   GEN: No acute distress.   Neck: No JVD Cardiac: RRR, no murmurs, rubs, or gallops.  Respiratory:  Wheezing, scattered Rales GI: Soft, nontender, non-distended  MS: No edema; No  deformity. Neuro:  Nonfocal  Psych: Normal affect   Labs    High Sensitivity Troponin:   Recent Labs  Lab 08/07/20 1135 08/07/20 1337 08/07/20 1619 08/07/20 1903  TROPONINIHS 195* 187* 169* 138*      Chemistry Recent Labs  Lab 08/07/20 1135 08/08/20 0556 08/09/20 0220  NA 140 138 140  K 3.8 3.9 3.8  CL 105 105 104  CO2 23 27 25   GLUCOSE 140* 114* 123*  BUN 9 10 9   CREATININE 0.63 0.71 0.68  CALCIUM 8.9 8.7* 8.7*  PROT 7.3  --   --   ALBUMIN 4.2  --   --   AST 19  --   --   ALT 12  --   --   ALKPHOS 64  --   --   BILITOT 0.8  --   --   GFRNONAA >60 >60 >60  ANIONGAP 12 6 11      Hematology Recent Labs  Lab 08/07/20 1135 08/08/20 0556 08/09/20 0220  WBC 12.0* 12.2* 13.1*  RBC 4.81 4.44 4.38  HGB 13.1 12.4 12.2  HCT 40.3 37.1 37.0  MCV 83.8 83.6 84.5  MCH 27.2 27.9 27.9  MCHC 32.5 33.4 33.0  RDW 16.6* 16.4* 16.5*  PLT 275 268 276    BNP Recent Labs  Lab 08/07/20  1135  BNP 259.6*     DDimer No results for input(s): DDIMER in the last 168 hours.   Radiology    ECHOCARDIOGRAM COMPLETE  Result Date: 08/08/2020    ECHOCARDIOGRAM REPORT   Patient Name:   Uchenna MAE Stipe Date of Exam: 08/08/2020 Medical Rec #:  [id number]          Height:       61.0 in Accession #:    [id number]         Weight:       115.2 lb Date of Birth:  05/30/1933          BSA:          1.494 m Patient Age:    86 years           BP:           135/80 mmHg Patient Gender: F                  HR:           63 bpm. Exam Location:  [facility] Procedure: 2D Echo, Cardiac Doppler and Color Doppler Indications:     Chest pain 786.50  History:         Patient has prior history of Echocardiogram examinations, most                  recent 10/08/2018. Previous Myocardial Infarction; Risk                  Factors:Hypertension and Diabetes.  Sonographer:     Jerry Hege RDCS (AE) Referring Phys:  [id number] CHRISTOPHER RONALD BERGE Diagnosing Phys: Byrne Capek MD  Sonographer Comments: Suboptimal apical window.  IMPRESSIONS  1. Left ventricular ejection fraction, by estimation, is 60 to 65%. The left ventricle has normal function. The left ventricle has no regional wall motion abnormalities. Left ventricular diastolic parameters are consistent with Grade II diastolic dysfunction (pseudonormalization).  2. Right ventricular systolic function is normal. The right ventricular size is normal. Tricuspid regurgitation signal is inadequate for assessing PA pressure.  3. Left atrial size was mildly dilated.  4. Mild mitral valve regurgitation. FINDINGS  Left Ventricle: Left ventricular ejection fraction, by estimation, is 60 to 65%. The left ventricle has normal function. The left ventricle has no regional wall motion abnormalities. The left ventricular internal cavity size was normal in size. There is  no left ventricular hypertrophy. Left ventricular diastolic parameters are consistent with Grade II diastolic dysfunction (pseudonormalization). Right Ventricle: The right ventricular size is normal. No increase in right ventricular wall thickness. Right ventricular systolic function is normal. Tricuspid regurgitation signal is inadequate for assessing PA pressure. Left Atrium: Left atrial size was mildly dilated. Right Atrium: Right atrial size was normal in size. Pericardium: There is no evidence of pericardial effusion. Mitral Valve: The mitral valve is normal in structure. Mild mitral valve regurgitation. No evidence of mitral valve stenosis. Tricuspid Valve: The tricuspid valve is normal in structure. Tricuspid valve regurgitation is not demonstrated. No evidence of tricuspid stenosis. Aortic Valve: The aortic valve was not well visualized. Aortic valve regurgitation is not visualized. Mild to moderate aortic valve sclerosis/calcification is present, without any evidence of aortic stenosis. Aortic valve mean gradient measures 5.7 mmHg.  Aortic valve peak gradient measures 10.1 mmHg. Aortic valve area, by VTI measures 1.88 cm.  Pulmonic Valve: The pulmonic valve was normal in structure. Pulmonic valve regurgitation is not visualized. No evidence of pulmonic stenosis. Aorta: The aortic root   is normal in size and structure. Venous: The inferior vena cava is normal in size with greater than 50% respiratory variability, suggesting right atrial pressure of 3 mmHg. IAS/Shunts: No atrial level shunt detected by color flow Doppler.  LEFT VENTRICLE PLAX 2D LVIDd:         4.45 cm  Diastology LVIDs:         2.17 cm  LV e' medial:    5.22 cm/s LV PW:         1.02 cm  LV E/e' medial:  21.8 LV IVS:        0.91 cm  LV e' lateral:   6.31 cm/s LVOT diam:     2.00 cm  LV E/e' lateral: 18.1 LV SV:         64 LV SV Index:   43 LVOT Area:     3.14 cm  RIGHT VENTRICLE RV Basal diam:  3.21 cm LEFT ATRIUM             Index       RIGHT ATRIUM           Index LA diam:        4.20 cm 2.81 cm/m  RA Area:     10.80 cm LA Vol (A2C):   68.9 ml 46.12 ml/m RA Volume:   20.40 ml  13.65 ml/m LA Vol (A4C):   52.0 ml 34.81 ml/m LA Biplane Vol: 63.1 ml 42.24 ml/m  AORTIC VALVE                    PULMONIC VALVE AV Area (Vmax):    1.69 cm     PV Vmax:        0.79 m/s AV Area (Vmean):   1.57 cm     PV Peak grad:   2.5 mmHg AV Area (VTI):     1.88 cm     RVOT Peak grad: 3 mmHg AV Vmax:           158.67 cm/s AV Vmean:          110.333 cm/s AV VTI:            0.340 m AV Peak Grad:      10.1 mmHg AV Mean Grad:      5.7 mmHg LVOT Vmax:         85.50 cm/s LVOT Vmean:        55.300 cm/s LVOT VTI:          0.203 m LVOT/AV VTI ratio: 0.60  AORTA Ao Root diam: 2.70 cm MITRAL VALVE MV Area (PHT): 2.69 cm     SHUNTS MV Decel Time: 282 msec     Systemic VTI:  0.20 m MV E velocity: 114.00 cm/s  Systemic Diam: 2.00 cm MV A velocity: 105.00 cm/s MV E/A ratio:  1.09 Julien Nordmann MD Electronically signed by Julien Nordmann MD Signature Date/Time: 08/08/2020/2:41:31 PM    Final     Cardiac Studies   Echocardiogram yesterday Normal ejection fraction, diastolic dysfunction, No  significant valve disease  Patient Profile     86 y.o. female with pmh of CAD s/p NSTEMI in 09/2018 s/p LAD DES, HTN, HLD, DM2, palpitations with short runs of PSVT on recent monitoring, diastolic dysfunction, and mild AI who is being seen for chest pain with mild troponin elevation/non-STEMI  Assessment & Plan    Unstable angina Diaphoretic October 7 with chest pain, recurrent severe symptoms morning of October 8, chest pain Elevated troponin  195 on arrival trending downward She declined stress testing, preferred cardiac catheterization, family also urging catheterization -Prior catheterization reviewed, prior LAD stent with 60% lesion beyond prior LAD stent --Orders placed, placed on the schedule first case tomorrow n.p.o. after midnight  Essential hypertension Continue metoprolol, lisinopril,  Given blood pressure was elevated since arrival, she was started on Imdur 30 daily yesterday, will continue to monitor  Hyperlipidemia Continue high intensity statin Goal LDL less than 70  COPD Chronic cough, appears to have chronic bronchitis with underlying lung disease Uses inhalers and nebulizers at home This admission with some cough, consistent with chronic bronchitis   Total encounter time more than 25 minutes  Greater than 50% was spent in counseling and coordination of care with the patient    For questions or updates, please contact [facility] Please consult [email or web address] for contact info under        Signed, Julien Nordmann, MD  08/09/2020, 2:03 PM

## 2020-08-09 NOTE — Progress Notes (Signed)
PROGRESS NOTE    Tanya Cole  NTI:[id number] DOB: August 22, 1933 DOA: 08/07/2020 PCP: Dale Rocky [place], MD    Assessment & Plan:   Principal Problem:   NSTEMI (non-ST elevated myocardial infarction) [facility]) Active Problems:   Hypertension   Hypercholesterolemia   Diabetes mellitus with cardiac complication (HCC)   Coronary artery disease   Iron deficiency   Asthma   Leukocytosis    Tanya Cole is a 86 y.o. female with medical history significant of CAD, DES placement 2019, HTN, HLD, DM, asthma, PMR, presents with SOB and chest pain.   Unstable angina history of NSTEMI with Des to LAD in 09/2018 --Troponin 195 on presentation, already trended down.  No significant EKG changes.  Started on heparin gtt. Pt is concerned about residual disease noted on prior catheterization, 60% lesion beyond prior LAD stent. Cardiology, Dr. Mariah Milling is consulted. PLAN: --Left heart cath tomorrow --cont heparin gtt for now, per cards - prn Nitroglycerin --continue asa, statin and metop  Hypertension -IV hydralazine as needed --continue home metop, lisinopril  --continue Imdur 30 daily (new)  Hypercholesterolemia --continue Lipitor --goal LDL <70  Diabetes mellitus with cardiac complication [facility]):  Recent A1c 7.4, poorly controlled.  Patient is taking Metformin at home --Hold metformin while inpatient --SSI  Hx of Iron deficiency anemia: Hemoglobin 13.1 --continue home iron supplement  COPD --presented with dyspnea and wheezing, however, not hypoxic, no cough, and wheezing reportedly common at baseline.  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  Denied personal hx of smoking. Follows with Dr. Meredeth Ide outpatient. PLAN: --DuoNeb QID --Dyspnea has improved, however, if respiratory symptoms worsen and left heart cath unremarkable, consider starting treatment for COPD exacerbation with steroid.  Leukocytosis: WBC 12.0.  No fever.  No  source of infection identified, likely reactive -Follow-up by CBC    DVT prophylaxis: QQ:[id number] gtt Code Status: DNR  Family Communication: son updated at the bedside today Status is: inpatient Dispo:   The patient is from: home Anticipated d/c is to: home Anticipated d/c date is: Monday Patient currently is not medically stable to d/c due to: pending left heart cath   Subjective and Interval History:  No chest pain.  Dyspnea better.  Pt reported still wheezing.     Objective: Vitals:   08/09/20 0653 08/09/20 0732 08/09/20 1056 08/09/20 1527  BP: (!) 113/53 (!) 103/46 128/60 (!) 114/53  Pulse: 74 65 71 67  Resp: 18 16  18   Temp: 99 F (37.2 C) 98.5 F (36.9 C) 98.6 F (37 C) 98.2 F (36.8 C)  TempSrc: Oral Oral Oral Oral  SpO2: 93% 97% 96% 95%  Weight:      Height:        Intake/Output Summary (Last 24 hours) at 08/09/2020 1615 Last data filed at 08/09/2020 1500 Gross per 24 hour  Intake 688.13 ml  Output 500 ml  Net 188.13 ml   Filed Weights   08/08/20 0304 08/09/20 0220 08/09/20 0500  Weight: 52.3 kg 51.8 kg 51.8 kg    Examination:   Constitutional: NAD, AAOx3 HEENT: conjunctivae and lids normal, EOMI CV: RRR no M,R,G. Distal pulses +2.  No cyanosis.   RESP: diffuse wheezes, on RA GI: +BS, NTND Extremities: No effusions, edema in BLE SKIN: warm, dry and intact Neuro: II - XII grossly intact.  Sensation intact Psych: Normal mood and affect.     Data Reviewed: I have personally reviewed following labs and imaging studies  CBC: Recent  Labs  Lab 08/07/20 1135 08/08/20 0556 08/09/20 0220  WBC 12.0* 12.2* 13.1*  NEUTROABS 8.0*  --   --   HGB 13.1 12.4 12.2  HCT 40.3 37.1 37.0  MCV 83.8 83.6 84.5  PLT 275 268 276   Basic Metabolic Panel: Recent Labs  Lab 08/07/20 1135 08/08/20 0556 08/09/20 0220  NA 140 138 140  K 3.8 3.9 3.8  CL 105 105 104  CO2 23 27 25   GLUCOSE 140* 114* 123*  BUN 9 10 9   CREATININE 0.63 0.71 0.68  CALCIUM 8.9  8.7* 8.7*  MG  --   --  1.7   GFR: Estimated Creatinine Clearance: 38.8 mL/min (by C-G formula based on SCr of 0.68 mg/dL). Liver Function Tests: Recent Labs  Lab 08/07/20 1135  AST 19  ALT 12  ALKPHOS 64  BILITOT 0.8  PROT 7.3  ALBUMIN 4.2   Recent Labs  Lab 08/07/20 1135  LIPASE 23   No results for input(s): AMMONIA in the last 168 hours. Coagulation Profile: Recent Labs  Lab 08/07/20 1135  INR 1.1   Cardiac Enzymes: No results for input(s): CKTOTAL, CKMB, CKMBINDEX, TROPONINI in the last 168 hours. BNP (last 3 results) No results for input(s): PROBNP in the last 8760 hours. HbA1C: Recent Labs    08/07/20 1337  HGBA1C 6.7*   CBG: Recent Labs  Lab 08/08/20 1124 08/08/20 1629 08/08/20 2025 08/09/20 0729 08/09/20 1113  GLUCAP 131* 130* 107* 122* 130*   Lipid Profile: Recent Labs    08/08/20 0556  CHOL 116  HDL 46  LDLCALC 37  TRIG 164*  CHOLHDL 2.5   Thyroid Function Tests: No results for input(s): TSH, T4TOTAL, FREET4, T3FREE, THYROIDAB in the last 72 hours. Anemia Panel: No results for input(s): VITAMINB12, FOLATE, FERRITIN, TIBC, IRON, RETICCTPCT in the last 72 hours. Sepsis Labs: No results for input(s): PROCALCITON, LATICACIDVEN in the last 168 hours.  Recent Results (from the past 240 hour(s))  Respiratory Panel by RT PCR (Flu A&B, Covid) - Nasopharyngeal Swab     Status: None   Collection Time: 08/07/20 11:43 AM   Specimen: Nasopharyngeal Swab  Result Value Ref Range Status   SARS Coronavirus 2 by RT PCR NEGATIVE NEGATIVE Final    Comment: (NOTE) SARS-CoV-2 target nucleic acids are NOT DETECTED.  The SARS-CoV-2 RNA is generally detectable in upper respiratoy specimens during the acute phase of infection. The lowest concentration of SARS-CoV-2 viral copies this assay can detect is 131 copies/mL. A negative result does not preclude SARS-Cov-2 infection and should not be used as the sole basis for treatment or other patient management  decisions. A negative result may occur with  improper specimen collection/handling, submission of specimen other than nasopharyngeal swab, presence of viral mutation(s) within the areas targeted by this assay, and inadequate number of viral copies (<131 copies/mL). A negative result must be combined with clinical observations, patient history, and epidemiological information. The expected result is Negative.  Fact Sheet for Patients:  https://www.moore.com/  Fact Sheet for Healthcare Providers:  https://www.young.biz/  This test is no t yet approved or cleared by the [place] FDA and  has been authorized for detection and/or diagnosis of SARS-CoV-2 by FDA under an Emergency Use Authorization (EUA). This EUA will remain  in effect (meaning this test can be used) for the duration of the COVID-19 declaration under Section 564(b)(1) of the Act, 21 U.S.C. section 360bbb-3(b)(1), unless the authorization is terminated or revoked sooner.     Influenza A by PCR  NEGATIVE NEGATIVE Final   Influenza B by PCR NEGATIVE NEGATIVE Final    Comment: (NOTE) The Xpert Xpress SARS-CoV-2/FLU/RSV assay is intended as an aid in  the diagnosis of influenza from Nasopharyngeal swab specimens and  should not be used as a sole basis for treatment. Nasal washings and  aspirates are unacceptable for Xpert Xpress SARS-CoV-2/FLU/RSV  testing.  Fact Sheet for Patients: https://www.moore.com/  Fact Sheet for Healthcare Providers: https://www.young.biz/  This test is not yet approved or cleared by the [place] FDA and  has been authorized for detection and/or diagnosis of SARS-CoV-2 by  FDA under an Emergency Use Authorization (EUA). This EUA will remain  in effect (meaning this test can be used) for the duration of the  Covid-19 declaration under Section 564(b)(1) of the Act, 21  U.S.C. section 360bbb-3(b)(1), unless the  authorization is  terminated or revoked. Performed at [facility], [address], [place], [place] [address]       Radiology Studies: ECHOCARDIOGRAM COMPLETE  Result Date: 08/08/2020    ECHOCARDIOGRAM REPORT   Patient Name:   Tanya Cole [facility] Date of Exam: 08/08/2020 Medical Rec #:  [id number]          Height:       61.0 in Accession #:    [id number]         Weight:       115.2 lb Date of Birth:  April 23, 1933          BSA:          1.494 m Patient Age:    87 years           BP:           135/80 mmHg Patient Gender: F                  HR:           63 bpm. Exam Location:  [facility] Procedure: 2D Echo, Cardiac Doppler and Color Doppler Indications:     Chest pain 786.50  History:         Patient has prior history of Echocardiogram examinations, most                  recent 10/08/2018. Previous Myocardial Infarction; Risk                  Factors:Hypertension and Diabetes.  Sonographer:     Cristela Blue RDCS (AE) Referring Phys:  [id number] Dois Davenport [facility] Diagnosing Phys: Julien Nordmann MD  Sonographer Comments: Suboptimal apical window. IMPRESSIONS  1. Left ventricular ejection fraction, by estimation, is 60 to 65%. The left ventricle has normal function. The left ventricle has no regional wall motion abnormalities. Left ventricular diastolic parameters are consistent with Grade II diastolic dysfunction (pseudonormalization).  2. Right ventricular systolic function is normal. The right ventricular size is normal. Tricuspid regurgitation signal is inadequate for assessing PA pressure.  3. Left atrial size was mildly dilated.  4. Mild mitral valve regurgitation. FINDINGS  Left Ventricle: Left ventricular ejection fraction, by estimation, is 60 to 65%. The left ventricle has normal function. The left ventricle has no regional wall motion abnormalities. The left ventricular internal cavity size was normal in size. There is  no left ventricular hypertrophy. Left ventricular diastolic parameters are  consistent with Grade II diastolic dysfunction (pseudonormalization). Right Ventricle: The right ventricular size is normal. No increase in right ventricular wall thickness. Right ventricular systolic function is normal. Tricuspid regurgitation signal is inadequate  for assessing PA pressure. Left Atrium: Left atrial size was mildly dilated. Right Atrium: Right atrial size was normal in size. Pericardium: There is no evidence of pericardial effusion. Mitral Valve: The mitral valve is normal in structure. Mild mitral valve regurgitation. No evidence of mitral valve stenosis. Tricuspid Valve: The tricuspid valve is normal in structure. Tricuspid valve regurgitation is not demonstrated. No evidence of tricuspid stenosis. Aortic Valve: The aortic valve was not well visualized. Aortic valve regurgitation is not visualized. Mild to moderate aortic valve sclerosis/calcification is present, without any evidence of aortic stenosis. Aortic valve mean gradient measures 5.7 mmHg.  Aortic valve peak gradient measures 10.1 mmHg. Aortic valve area, by VTI measures 1.88 cm. Pulmonic Valve: The pulmonic valve was normal in structure. Pulmonic valve regurgitation is not visualized. No evidence of pulmonic stenosis. Aorta: The aortic root is normal in size and structure. Venous: The inferior vena cava is normal in size with greater than 50% respiratory variability, suggesting right atrial pressure of 3 mmHg. IAS/Shunts: No atrial level shunt detected by color flow Doppler.  LEFT VENTRICLE PLAX 2D LVIDd:         4.45 cm  Diastology LVIDs:         2.17 cm  LV e' medial:    5.22 cm/s LV PW:         1.02 cm  LV E/e' medial:  21.8 LV IVS:        0.91 cm  LV e' lateral:   6.31 cm/s LVOT diam:     2.00 cm  LV E/e' lateral: 18.1 LV SV:         64 LV SV Index:   43 LVOT Area:     3.14 cm  RIGHT VENTRICLE RV Basal diam:  3.21 cm LEFT ATRIUM             Index       RIGHT ATRIUM           Index LA diam:        4.20 cm 2.81 cm/m  RA Area:      10.80 cm LA Vol (A2C):   68.9 ml 46.12 ml/m RA Volume:   20.40 ml  13.65 ml/m LA Vol (A4C):   52.0 ml 34.81 ml/m LA Biplane Vol: 63.1 ml 42.24 ml/m  AORTIC VALVE                    PULMONIC VALVE AV Area (Vmax):    1.69 cm     PV Vmax:        0.79 m/s AV Area (Vmean):   1.57 cm     PV Peak grad:   2.5 mmHg AV Area (VTI):     1.88 cm     RVOT Peak grad: 3 mmHg AV Vmax:           158.67 cm/s AV Vmean:          110.333 cm/s AV VTI:            0.340 m AV Peak Grad:      10.1 mmHg AV Mean Grad:      5.7 mmHg LVOT Vmax:         85.50 cm/s LVOT Vmean:        55.300 cm/s LVOT VTI:          0.203 m LVOT/AV VTI ratio: 0.60  AORTA Ao Root diam: 2.70 cm MITRAL VALVE MV Area (PHT): 2.69 cm     SHUNTS MV  Decel Time: 282 msec     Systemic VTI:  0.20 m MV E velocity: 114.00 cm/s  Systemic Diam: 2.00 cm MV A velocity: 105.00 cm/s MV E/A ratio:  1.09 Julien Nordmann MD Electronically signed by Julien Nordmann MD Signature Date/Time: 08/08/2020/2:41:31 PM    Final      Scheduled Meds: . aspirin EC  81 mg Oral Daily  . atorvastatin  80 mg Oral Daily  . ferrous sulfate  325 mg Oral Q breakfast  . insulin aspart  0-5 Units Subcutaneous QHS  . insulin aspart  0-9 Units Subcutaneous TID WC  . ipratropium-albuterol  3 mL Nebulization QID  . isosorbide mononitrate  30 mg Oral Daily  . lisinopril  20 mg Oral Daily  . metoprolol tartrate  25 mg Oral BID  . multivitamin-lutein  1 capsule Oral Daily   Continuous Infusions: . heparin 1,050 Units/hr (08/09/20 1458)     LOS: 2 days     Darlin Priestly, MD [facility] If 7PM-7AM, please contact night-coverage 08/09/2020, 4:15 PM

## 2020-08-10 ENCOUNTER — Encounter
Admission: EM | Disposition: A | Discharge status: Home / Self Care | Payer: Self-pay | Source: Home / Self Care | Attending: Hospitalist

## 2020-08-10 ENCOUNTER — Telehealth: Payer: Self-pay

## 2020-08-10 ENCOUNTER — Encounter: Payer: Self-pay | Admitting: Cardiovascular Disease

## 2020-08-10 DIAGNOSIS — I214 Non-ST elevation (NSTEMI) myocardial infarction: Secondary | ICD-10-CM | POA: Diagnosis not present

## 2020-08-10 DIAGNOSIS — I200 Unstable angina: Secondary | ICD-10-CM

## 2020-08-10 DIAGNOSIS — I25110 Atherosclerotic heart disease of native coronary artery with unstable angina pectoris: Secondary | ICD-10-CM | POA: Diagnosis not present

## 2020-08-10 DIAGNOSIS — I10 Essential (primary) hypertension: Secondary | ICD-10-CM | POA: Diagnosis not present

## 2020-08-10 HISTORY — PX: LEFT HEART CATH AND CORONARY ANGIOGRAPHY: CATH118249

## 2020-08-10 LAB — CBC
HCT: 39.6 % (ref 36.0–46.0)
Hemoglobin: 12.9 g/dL (ref 12.0–15.0)
MCH: 27.8 pg (ref 26.0–34.0)
MCHC: 32.6 g/dL (ref 30.0–36.0)
MCV: 85.3 fL (ref 80.0–100.0)
Platelets: 289 10*3/uL (ref 150–400)
RBC: 4.64 MIL/uL (ref 3.87–5.11)
RDW: 16.6 % — ABNORMAL HIGH (ref 11.5–15.5)
WBC: 12.5 10*3/uL — ABNORMAL HIGH (ref 4.0–10.5)
nRBC: 0 % (ref 0.0–0.2)

## 2020-08-10 LAB — HEPARIN LEVEL (UNFRACTIONATED): Heparin Unfractionated: 0.24 IU/mL — ABNORMAL LOW (ref 0.30–0.70)

## 2020-08-10 LAB — BASIC METABOLIC PANEL
Anion gap: 12 (ref 5–15)
BUN: 9 mg/dL (ref 8–23)
CO2: 26 mmol/L (ref 22–32)
Calcium: 8.9 mg/dL (ref 8.9–10.3)
Chloride: 104 mmol/L (ref 98–111)
Creatinine, Ser: 0.67 mg/dL (ref 0.44–1.00)
GFR, Estimated: >60 mL/min (ref >60)
Glucose, Bld: 137 mg/dL — ABNORMAL HIGH (ref 70–99)
Potassium: 3.9 mmol/L (ref 3.5–5.1)
Sodium: 142 mmol/L (ref 135–145)

## 2020-08-10 LAB — GLUCOSE, CAPILLARY
Glucose-Capillary: 146 mg/dL — ABNORMAL HIGH (ref 70–99)
Glucose-Capillary: 121 mg/dL — ABNORMAL HIGH (ref 70–99)

## 2020-08-10 LAB — MAGNESIUM: Magnesium: 1.7 mg/dL (ref 1.7–2.4)

## 2020-08-10 SURGERY — LEFT HEART CATH AND CORONARY ANGIOGRAPHY
Anesthesia: Moderate Sedation

## 2020-08-10 MED ORDER — HEPARIN (PORCINE) IN NACL 1000-0.9 UT/500ML-% IV SOLN
INTRAVENOUS | Status: AC
  Filled 2020-08-10: qty 1000

## 2020-08-10 MED ORDER — HEPARIN SODIUM (PORCINE) 1000 UNIT/ML IJ SOLN
INTRAMUSCULAR | Status: DC | PRN
  Administered 2020-08-10: 2500 [IU] via INTRAVENOUS

## 2020-08-10 MED ORDER — SODIUM CHLORIDE 0.9 % IV SOLN: 250.0000 mL | INTRAVENOUS | Status: DC | PRN

## 2020-08-10 MED ORDER — VERAPAMIL HCL 2.5 MG/ML IV SOLN
INTRAVENOUS | Status: DC | PRN
  Administered 2020-08-10: 2.5 mg via INTRAVENOUS

## 2020-08-10 MED ORDER — SODIUM CHLORIDE 0.9% FLUSH: 3.0000 mL | INTRAVENOUS | Status: DC | PRN

## 2020-08-10 MED ORDER — FENTANYL CITRATE (PF) 100 MCG/2ML IJ SOLN
INTRAMUSCULAR | Status: DC | PRN
Start: 2020-08-10 — End: 2020-08-10
  Administered 2020-08-10: 25 ug via INTRAVENOUS

## 2020-08-10 MED ORDER — HEPARIN SODIUM (PORCINE) 1000 UNIT/ML IJ SOLN
INTRAMUSCULAR | Status: AC
  Filled 2020-08-10: qty 1

## 2020-08-10 MED ORDER — ASPIRIN 81 MG PO CHEW
CHEWABLE_TABLET | ORAL | Status: AC
  Filled 2020-08-10: qty 1

## 2020-08-10 MED ORDER — SODIUM CHLORIDE 0.9 % WEIGHT BASED INFUSION: 1.0000 mL/kg/h | INTRAVENOUS | Status: DC

## 2020-08-10 MED ORDER — SODIUM CHLORIDE 0.9 % WEIGHT BASED INFUSION
1.0000 mL/kg/h | INTRAVENOUS | Status: DC
  Administered 2020-08-10: 1 mL/kg/h via INTRAVENOUS

## 2020-08-10 MED ORDER — IOHEXOL 300 MG/ML  SOLN
INTRAMUSCULAR | Status: DC | PRN
  Administered 2020-08-10: 50 mL

## 2020-08-10 MED ORDER — SODIUM CHLORIDE 0.9 % WEIGHT BASED INFUSION
3.0000 mL/kg/h | INTRAVENOUS | Status: DC
  Administered 2020-08-10: 3 mL/kg/h via INTRAVENOUS

## 2020-08-10 MED ORDER — ASPIRIN 81 MG PO CHEW
81.0000 mg | CHEWABLE_TABLET | ORAL | Status: AC
  Administered 2020-08-10: 81 mg via ORAL
  Filled 2020-08-10: qty 1

## 2020-08-10 MED ORDER — HEPARIN (PORCINE) IN NACL 1000-0.9 UT/500ML-% IV SOLN
INTRAVENOUS | Status: DC | PRN
  Administered 2020-08-10: 500 mL

## 2020-08-10 MED ORDER — VERAPAMIL HCL 2.5 MG/ML IV SOLN
INTRAVENOUS | Status: AC
  Filled 2020-08-10: qty 2

## 2020-08-10 MED ORDER — ASPIRIN EC 81 MG PO TBEC: 81.0000 mg | DELAYED_RELEASE_TABLET | Freq: Every day | ORAL | Status: DC

## 2020-08-10 MED ORDER — SODIUM CHLORIDE 0.9% FLUSH
3.0000 mL | Freq: Two times a day (BID) | INTRAVENOUS | Status: DC
  Administered 2020-08-10: 3 mL via INTRAVENOUS

## 2020-08-10 MED ORDER — ASPIRIN 81 MG PO CHEW: 81.0000 mg | CHEWABLE_TABLET | ORAL | Status: DC

## 2020-08-10 MED ORDER — MIDAZOLAM HCL 2 MG/2ML IJ SOLN
INTRAMUSCULAR | Status: AC
  Filled 2020-08-10: qty 2

## 2020-08-10 MED ORDER — SODIUM CHLORIDE 0.9 % WEIGHT BASED INFUSION: 3.0000 mL/kg/h | INTRAVENOUS | Status: DC

## 2020-08-10 MED ORDER — MIDAZOLAM HCL 2 MG/2ML IJ SOLN
INTRAMUSCULAR | Status: DC | PRN
  Administered 2020-08-10: 1 mg via INTRAVENOUS

## 2020-08-10 MED ORDER — SODIUM CHLORIDE 0.9 % IV SOLN: INTRAVENOUS | Status: AC

## 2020-08-10 MED ORDER — FENTANYL CITRATE (PF) 100 MCG/2ML IJ SOLN
INTRAMUSCULAR | Status: AC
  Filled 2020-08-10: qty 2

## 2020-08-10 MED ORDER — IPRATROPIUM-ALBUTEROL 0.5-2.5 (3) MG/3ML IN SOLN
RESPIRATORY_TRACT | Status: AC
  Filled 2020-08-10: qty 3

## 2020-08-10 MED ORDER — HEPARIN BOLUS VIA INFUSION
800.0000 [IU] | Freq: Once | INTRAVENOUS | Status: AC
  Administered 2020-08-10: 800 [IU] via INTRAVENOUS
  Filled 2020-08-10: qty 800

## 2020-08-10 MED ORDER — ISOSORBIDE MONONITRATE ER 30 MG PO TB24
30.0000 mg | ORAL_TABLET | Freq: Every day | ORAL | 0 refills | Status: DC
Start: 2020-08-11 — End: 2021-01-11

## 2020-08-10 SURGICAL SUPPLY — 8 items
CATH 5F 110X4 TIG (CATHETERS) ×2 IMPLANT
DEVICE RAD TR BAND REGULAR (VASCULAR PRODUCTS) ×2 IMPLANT
GLIDESHEATH SLEND SS 6F .021 (SHEATH) ×2 IMPLANT
GUIDEWIRE INQWIRE 1.5J.035X260 (WIRE) IMPLANT
INQWIRE 1.5J .035X260CM (WIRE) ×3
KIT MANI 3VAL PERCEP (MISCELLANEOUS) ×3 IMPLANT
PACK CARDIAC CATH (CUSTOM PROCEDURE TRAY) ×3 IMPLANT
WIRE HITORQ VERSACORE ST 145CM (WIRE) ×2 IMPLANT

## 2020-08-10 NOTE — Telephone Encounter (Signed)
Noted  

## 2020-08-10 NOTE — Progress Notes (Signed)
ANTICOAGULATION CONSULT NOTE  Pharmacy Consult for Heparin Indication: chest pain/ACS  Allergies  Allergen Reactions  . Tramadol Itching and Nausea And Vomiting    Patient Measurements: Height: 5\' 1"  (154.9 cm) Weight: 52.5 kg (115 lb 12.8 oz) IBW/kg (Calculated) : 47.8 Heparin Dosing Weight: 54 kg  Vital Signs: Temp: 98.7 F (37.1 C) (10/11 0316) Temp Source: Oral (10/11 0316) BP: 132/51 (10/11 0316) Pulse Rate: 71 (10/11 0316)  Labs: Recent Labs     0000 08/07/20 1135 08/07/20 1135 08/07/20 1337 08/07/20 1619 08/07/20 1903 08/07/20 2139 08/08/20 0556 08/08/20 1713 08/09/20 0220 08/09/20 1006 08/10/20 0537  HGB   < > 13.1  --   --   --   --   --  12.4  --  12.2  --  12.9  HCT   < > 40.3  --   --   --   --   --  37.1  --  37.0  --  39.6  PLT   < > 275  --   --   --   --   --  268  --  276  --  289  APTT  --  32  --   --   --   --   --   --   --   --   --   --   LABPROT  --  13.6  --   --   --   --   --   --   --   --   --   --   INR  --  1.1  --   --   --   --   --   --   --   --   --   --   HEPARINUNFRC  --   --   --   --   --   --    < > 0.18*   < > 0.44 0.33 0.24*  CREATININE   < > 0.63  --   --   --   --   --  0.71  --  0.68  --  0.67  TROPONINIHS  --  195*   < > 187* 169* 138*  --   --   --   --   --   --    < > = values in this interval not displayed.    Estimated Creatinine Clearance: 38.8 mL/min (by C-G formula based on SCr of 0.67 mg/dL).   Medical History: Past Medical History:  Diagnosis Date  . Aortic insufficiency    a. TTE 12/19: EF 55-60%, probable HK of the mid apical anterior septal myocardium, Gr1DD, mild AI, mildly dilated LA  . Asthma   . CAD (coronary artery disease)    a. NSTEMI 12/19; b. LHC 10/08/18: LM minimal luminal irregs, mLAD-1 95% s/p PCI/DES, mLAD-2 60%, LCx mild diffuse disease throughout, RCA minimal luminal irregs; b. 03/2020 MV: EF>65%, no ischemia/scar.  . Diabetes mellitus (HCC)   . Hypercholesterolemia   .  Hypertension   . Myocardial infarction (HCC)   . Osteopenia   . Palpitations    a. 04/2020 [facility]: Avg HR 75. 429 SVT episodes, longest 19 secs @ 133. Occas PACs (3.2%). Rare PVCs (<1%).  . Polymyalgia rheumatica syndrome (HCC)   . Reactive airway disease       Assessment: 86 yo female here with elevated troponin to start on Heparin drip for NSTEMI. No oral anticoagulants noted on PTA med  list.   10/8 2139 HL <0.1 - increase drip rate to 850 units/hr 10/9 0556 HL 0.18 - increase drip rate to 950 units/hr 10/9 1713 HL 0.18   Goal of Therapy:  Heparin level 0.3-0.7 units/ml Monitor platelets by anticoagulation protocol: Yes   Plan:  10/9 1713 HL 0.18 subtherapeutic. Heparin 1200 unit bolus followed by increase in heparin drip to 1050 units/hr. Recheck HL in 8 hours. CBC daily.  10/10:  HL @ 0220 = 0.44 Will continue pt on current rate and recheck HL in 8 hrs.   10/11: HL @ 0537 = 0.24 Will order Heparin 800 units IV X 1 and increase drip rate to 1150 units/hr Will recheck HL 8 hrs after rate change.   Pharmacy will continue to follow.   Tammey Deeg D 08/10/2020 6:55 AM

## 2020-08-10 NOTE — Care Management Important Message (Signed)
Important Message  Patient Details  Name: Tanya Cole MRN: [id number] Date of Birth: 04-24-1933   Medicare Important Message Given:  Yes     [name] 08/10/2020, 12:06 PM

## 2020-08-10 NOTE — Discharge Summary (Signed)
Physician Discharge Summary  [place] Ostroff TIR:[id number] DOB: 1932-12-15 DOA: 08/07/2020  PCP: Dale Fuig, MD  Admit date: 08/07/2020 Discharge date: 08/10/2020  Admitted From: Home Disposition: Home   Recommendations for Outpatient Follow-up:  1. Follow up with PCP in 1-2 weeks 2. Please obtain BMP/CBC in one week 3. Please follow up on the following pending results:  Home Health:No Equipment/Devices:None Discharge Condition: Stable CODE STATUS: DNR Diet recommendation: Heart Healthy / Carb Modified   Brief/Interim Summary: Tanya Cole a 86 y.o.femalewith medical history significant ofCAD, DES placement2019, HTN, HLD, DM, asthma, PMR, presents with SOB and chestpain.  Patient has an history of NSTEMI with stent to LAD in December 2019.  Mildly elevated troponin which can be secondary to demand ischemia. Cardiology was consulted due to her prior history and she was taken for cardiac catheterization, which shows stable 60% mid LAD stenosis and a 90% stenosis in the distal LAD close to apex.  That lesion was not amenable for any intervention.  Cardiology was recommending maximization of medical management and will follow up as an outpatient.  Patient has A1c of 7.4 which shows poorly controlled diabetes.  She does take Metformin at home.  Needs a close follow-up with primary care provider for further management and better control of diabetes as a risk factor reduction for cardiac disease.  Patient also has a history of COPD and there was some transient wheezing on admission.  No need to treat as an COPD exacerbation and she will continue with her home inhalers and follow-up with her pulmonologist.  Patient remained chest pain-free after cardiac catheterization and going to be discharged home and will follow up with her prior providers.  Discharge Diagnoses:  Principal Problem:   NSTEMI (non-ST elevated myocardial infarction) (HCC) Active Problems:    Hypertension   Hypercholesterolemia   Diabetes mellitus with cardiac complication (HCC)   Coronary artery disease   Iron deficiency   Asthma   Leukocytosis   Discharge Instructions  Discharge Instructions    Diet - low sodium heart healthy   Complete by: As directed    Discharge instructions   Complete by: As directed    It was pleasure taking care of you. Your cardiologist admitted in your medicine-please take it as directed. Follow-up with your cardiologist in 1 week.   Increase activity slowly   Complete by: As directed      Allergies as of 08/10/2020      Reactions   Tramadol Itching, Nausea And Vomiting      Medication List    STOP taking these medications   Clenpiq 10-3.5-12 MG-GM -GM/160ML Soln Generic drug: Sod Picosulfate-Mag Ox-Cit Acd   ibuprofen 400 MG tablet Commonly known as: ADVIL   Sutab [phone] MG Tabs Generic drug: Sodium Sulfate-Mag Sulfate-KCl     TAKE these medications   Advair Diskus 250-50 MCG/DOSE Aepb Generic drug: Fluticasone-Salmeterol INHALE ONE PUFF BY MOUTH EVERY 12 HOURS. RINSE MOUTH AFTER EACH USE   alendronate 70 MG tablet Commonly known as: FOSAMAX Take 70 mg by mouth once a week.   aspirin 81 MG tablet Take 81 mg by mouth daily.   atorvastatin 80 MG tablet Commonly known as: LIPITOR Take 1 tablet (80 mg total) by mouth daily at 6 PM.   Compressor/Nebulizer Misc 1 Units by Does not apply route as directed.   ferrous sulfate 325 (65 FE) MG tablet Take 325 mg by mouth daily with breakfast.   fluticasone 50 MCG/ACT nasal spray Commonly known as: FLONASE Use  2 spray(s) in each nostril once daily   isosorbide mononitrate 30 MG 24 hr tablet Commonly known as: IMDUR Take 1 tablet (30 mg total) by mouth daily. Start taking on: August 11, 2020   lisinopril 20 MG tablet Commonly known as: ZESTRIL Take 1 tablet by mouth once daily   metFORMIN 500 MG tablet Commonly known as: GLUCOPHAGE TAKE 1 TABLET BY MOUTH  TWICE DAILY WITH A MEAL   metoprolol tartrate 25 MG tablet Commonly known as: LOPRESSOR Take 1 tablet (25 mg total) by mouth 2 (two) times daily.   nitroGLYCERIN 0.4 MG SL tablet Commonly known as: NITROSTAT Place 1 tablet (0.4 mg total) under the tongue every 5 (five) minutes as needed for chest pain.   ProAir HFA 108 (90 Base) MCG/ACT inhaler Generic drug: albuterol Inhale 2 puffs into the lungs every 4 (four) hours as needed for wheezing or shortness of breath.   albuterol (2.5 MG/3ML) 0.083% nebulizer solution Commonly known as: PROVENTIL Take 3 mLs (2.5 mg total) by nebulization every 4 (four) hours as needed for wheezing or shortness of breath.   VISION FORMULA/LUTEIN PO Take 1 tablet by mouth daily.   Vitamin D (Ergocalciferol) 1.25 MG (50000 UNIT) Caps capsule Commonly known as: DRISDOL Take 50,000 Units by mouth once a week.       Follow-up Information    Dale , MD. Schedule an appointment as soon as possible for a visit.   Specialty: Internal Medicine Contact information: [address] [place] [address] [phone]        Iran Ouch, MD .   Specialty: Cardiology Contact information: [address] [place] [address] [phone]        Antonieta Iba, MD Follow up in 1 week(s).   Specialty: Cardiology Contact information: [address] [place] [address] [phone]              Allergies  Allergen Reactions  . Tramadol Itching and Nausea And Vomiting    Consultations:  Cardiology  Procedures/Studies: CT Abdomen Pelvis W Contrast  Result Date: 07/27/2020 CLINICAL DATA:  Unintended weight loss, persistent iron deficiency EXAM: CT ABDOMEN AND PELVIS WITH CONTRAST TECHNIQUE: Multidetector CT imaging of the abdomen and pelvis was performed using the standard protocol following bolus administration of intravenous contrast. CONTRAST:  75mL OMNIPAQUE IOHEXOL  300 MG/ML SOLN, additional oral enteric contrast COMPARISON:  10/06/2016 FINDINGS: Lower chest: No acute abnormality. Hepatobiliary: No focal liver abnormality is seen. Status post cholecystectomy. No biliary dilatation. Pancreas: Unremarkable. No pancreatic ductal dilatation or surrounding inflammatory changes. Spleen: Normal in size without significant abnormality. Adrenals/Urinary Tract: Adrenal glands are unremarkable. Kidneys are normal, without renal calculi, solid lesion, or hydronephrosis. Bladder is unremarkable. Stomach/Bowel: Stomach is within normal limits. Appendix appears normal. No evidence of bowel wall thickening, distention, or inflammatory changes. Severe pancolonic diverticulosis, worst in the sigmoid. Vascular/Lymphatic: Aortic atherosclerosis. No enlarged abdominal or pelvic lymph nodes. Reproductive: Status post cholecystectomy. Other: No abdominal wall hernia or abnormality. No abdominopelvic ascites. Musculoskeletal: No acute or significant osseous findings. IMPRESSION: 1. No CT findings of the abdomen or pelvis to explain weight loss. 2. Severe pancolonic diverticulosis, worst in the sigmoid. No evidence of acute diverticulitis. 3. Status post cholecystectomy and hysterectomy. 4. Aortic Atherosclerosis (ICD10-I70.0). Electronically Signed   By: Lauralyn Primes M.D.   On: 07/27/2020 14:00   CARDIAC CATHETERIZATION  Result Date: 08/10/2020  Mid LAD lesion is 60% stenosed.  Prox LAD lesion is 30%  stenosed.  Dist LAD lesion is 90% stenosed.  Prox RCA lesion is 30% stenosed.  The left ventricular systolic function is normal.  LV end diastolic pressure is mildly elevated.  The left ventricular ejection fraction is 55-65% by visual estimate.  1.  Significant one-vessel coronary artery disease with patent proximal LAD stent with mild in-stent restenosis.  Stable mid LAD stenosis at 60% and significant distal LAD stenosis 90% close to the apex. 2.  Normal LV systolic function mildly  elevated left ventricular end-diastolic pressure. Recommendations: Continue medical therapy.  Distal LAD stenosis is close to the apex and not amenable to PCI.   DG Chest Portable 1 View  Result Date: 08/07/2020 CLINICAL DATA:  Shortness of breath today. EXAM: PORTABLE CHEST 1 VIEW COMPARISON:  06/08/2020 and 04/06/2020 FINDINGS: Lungs are adequately inflated without acute airspace consolidation or effusion. Calcified granuloma left base. Cardiomediastinal silhouette and remainder of the exam is unchanged. IMPRESSION: No acute cardiopulmonary disease. Electronically Signed   By: Elberta Fortis M.D.   On: 08/07/2020 12:27   ECHOCARDIOGRAM COMPLETE  Result Date: 08/08/2020    ECHOCARDIOGRAM REPORT   Patient Name:   Tanya Cole [facility] Date of Exam: 08/08/2020 Medical Rec #:  [id number]          Height:       61.0 in Accession #:    [id number]         Weight:       115.2 lb Date of Birth:  November 10, 1932          BSA:          1.494 m Patient Age:    87 years           BP:           135/80 mmHg Patient Gender: F                  HR:           63 bpm. Exam Location:  [facility] Procedure: 2D Echo, Cardiac Doppler and Color Doppler Indications:     Chest pain 786.50  History:         Patient has prior history of Echocardiogram examinations, most                  recent 10/08/2018. Previous Myocardial Infarction; Risk                  Factors:Hypertension and Diabetes.  Sonographer:     Cristela Blue RDCS (AE) Referring Phys:  [id number] [facility] Diagnosing Phys: Julien Nordmann MD  Sonographer Comments: Suboptimal apical window. IMPRESSIONS  1. Left ventricular ejection fraction, by estimation, is 60 to 65%. The left ventricle has normal function. The left ventricle has no regional wall motion abnormalities. Left ventricular diastolic parameters are consistent with Grade II diastolic dysfunction (pseudonormalization).  2. Right ventricular systolic function is normal. The right ventricular size is normal. Tricuspid  regurgitation signal is inadequate for assessing PA pressure.  3. Left atrial size was mildly dilated.  4. Mild mitral valve regurgitation. FINDINGS  Left Ventricle: Left ventricular ejection fraction, by estimation, is 60 to 65%. The left ventricle has normal function. The left ventricle has no regional wall motion abnormalities. The left ventricular internal cavity size was normal in size. There is  no left ventricular hypertrophy. Left ventricular diastolic parameters are consistent with Grade II diastolic dysfunction (pseudonormalization). Right Ventricle: The right ventricular size is normal. No increase in right ventricular wall thickness. Right  ventricular systolic function is normal. Tricuspid regurgitation signal is inadequate for assessing PA pressure. Left Atrium: Left atrial size was mildly dilated. Right Atrium: Right atrial size was normal in size. Pericardium: There is no evidence of pericardial effusion. Mitral Valve: The mitral valve is normal in structure. Mild mitral valve regurgitation. No evidence of mitral valve stenosis. Tricuspid Valve: The tricuspid valve is normal in structure. Tricuspid valve regurgitation is not demonstrated. No evidence of tricuspid stenosis. Aortic Valve: The aortic valve was not well visualized. Aortic valve regurgitation is not visualized. Mild to moderate aortic valve sclerosis/calcification is present, without any evidence of aortic stenosis. Aortic valve mean gradient measures 5.7 mmHg.  Aortic valve peak gradient measures 10.1 mmHg. Aortic valve area, by VTI measures 1.88 cm. Pulmonic Valve: The pulmonic valve was normal in structure. Pulmonic valve regurgitation is not visualized. No evidence of pulmonic stenosis. Aorta: The aortic root is normal in size and structure. Venous: The inferior vena cava is normal in size with greater than 50% respiratory variability, suggesting right atrial pressure of 3 mmHg. IAS/Shunts: No atrial level shunt detected by color flow  Doppler.  LEFT VENTRICLE PLAX 2D LVIDd:         4.45 cm  Diastology LVIDs:         2.17 cm  LV e' medial:    5.22 cm/s LV PW:         1.02 cm  LV E/e' medial:  21.8 LV IVS:        0.91 cm  LV e' lateral:   6.31 cm/s LVOT diam:     2.00 cm  LV E/e' lateral: 18.1 LV SV:         64 LV SV Index:   43 LVOT Area:     3.14 cm  RIGHT VENTRICLE RV Basal diam:  3.21 cm LEFT ATRIUM             Index       RIGHT ATRIUM           Index LA diam:        4.20 cm 2.81 cm/m  RA Area:     10.80 cm LA Vol (A2C):   68.9 ml 46.12 ml/m RA Volume:   20.40 ml  13.65 ml/m LA Vol (A4C):   52.0 ml 34.81 ml/m LA Biplane Vol: 63.1 ml 42.24 ml/m  AORTIC VALVE                    PULMONIC VALVE AV Area (Vmax):    1.69 cm     PV Vmax:        0.79 m/s AV Area (Vmean):   1.57 cm     PV Peak grad:   2.5 mmHg AV Area (VTI):     1.88 cm     RVOT Peak grad: 3 mmHg AV Vmax:           158.67 cm/s AV Vmean:          110.333 cm/s AV VTI:            0.340 m AV Peak Grad:      10.1 mmHg AV Mean Grad:      5.7 mmHg LVOT Vmax:         85.50 cm/s LVOT Vmean:        55.300 cm/s LVOT VTI:          0.203 m LVOT/AV VTI ratio: 0.60  AORTA Ao Root diam: 2.70 cm MITRAL VALVE MV  Area (PHT): 2.69 cm     SHUNTS MV Decel Time: 282 msec     Systemic VTI:  0.20 m MV E velocity: 114.00 cm/s  Systemic Diam: 2.00 cm MV A velocity: 105.00 cm/s MV E/A ratio:  1.09 Julien Nordmann MD Electronically signed by Julien Nordmann MD Signature Date/Time: 08/08/2020/2:41:31 PM    Final      Subjective: Patient was seen after cardiac catheterization today.  No chest pain or shortness of breath.  Daughter was in the room.  She would like to go home as soon as cardiology clears her after removing band.  Discharge Exam: Vitals:   08/10/20 1052 08/10/20 1212  BP: (!) 141/62   Pulse: 73   Resp: 16   Temp: 98.7 F (37.1 C)   SpO2: 96% 95%   Vitals:   08/10/20 1000 08/10/20 1031 08/10/20 1052 08/10/20 1212  BP: (!) 135/59 140/63 (!) 141/62   Pulse: 71 73 73   Resp: (!)  27 (!) 21 16   Temp:   98.7 F (37.1 C)   TempSrc:   Oral   SpO2: 94% 97% 96% 95%  Weight:      Height:        General: Pt is alert, awake, not in acute distress Cardiovascular: RRR, S1/S2 +, no rubs, no gallops Respiratory: CTA bilaterally, no wheezing, no rhonchi Abdominal: Soft, NT, ND, bowel sounds + Extremities: no edema, no cyanosis   The results of significant diagnostics from this hospitalization (including imaging, microbiology, ancillary and laboratory) are listed below for reference.    Microbiology: Recent Results (from the past 240 hour(s))  Respiratory Panel by RT PCR (Flu A&B, Covid) - Nasopharyngeal Swab     Status: None   Collection Time: 08/07/20 11:43 AM   Specimen: Nasopharyngeal Swab  Result Value Ref Range Status   SARS Coronavirus 2 by RT PCR NEGATIVE NEGATIVE Final    Comment: (NOTE) SARS-CoV-2 target nucleic acids are NOT DETECTED.  The SARS-CoV-2 RNA is generally detectable in upper respiratoy specimens during the acute phase of infection. The lowest concentration of SARS-CoV-2 viral copies this assay can detect is 131 copies/mL. A negative result does not preclude SARS-Cov-2 infection and should not be used as the sole basis for treatment or other patient management decisions. A negative result may occur with  improper specimen collection/handling, submission of specimen other than nasopharyngeal swab, presence of viral mutation(s) within the areas targeted by this assay, and inadequate number of viral copies (<131 copies/mL). A negative result must be combined with clinical observations, patient history, and epidemiological information. The expected result is Negative.  Fact Sheet for Patients:  https://www.moore.com/  Fact Sheet for Healthcare Providers:  https://www.young.biz/  This test is no t yet approved or cleared by the [place] FDA and  has been authorized for detection and/or diagnosis of  SARS-CoV-2 by FDA under an Emergency Use Authorization (EUA). This EUA will remain  in effect (meaning this test can be used) for the duration of the COVID-19 declaration under Section 564(b)(1) of the Act, 21 U.S.C. section 360bbb-3(b)(1), unless the authorization is terminated or revoked sooner.     Influenza A by PCR NEGATIVE NEGATIVE Final   Influenza B by PCR NEGATIVE NEGATIVE Final    Comment: (NOTE) The Xpert Xpress SARS-CoV-2/FLU/RSV assay is intended as an aid in  the diagnosis of influenza from Nasopharyngeal swab specimens and  should not be used as a sole basis for treatment. Nasal washings and  aspirates are unacceptable for Xpert  Xpress SARS-CoV-2/FLU/RSV  testing.  Fact Sheet for Patients: https://www.moore.com/  Fact Sheet for Healthcare Providers: https://www.young.biz/  This test is not yet approved or cleared by the [place] FDA and  has been authorized for detection and/or diagnosis of SARS-CoV-2 by  FDA under an Emergency Use Authorization (EUA). This EUA will remain  in effect (meaning this test can be used) for the duration of the  Covid-19 declaration under Section 564(b)(1) of the Act, 21  U.S.C. section 360bbb-3(b)(1), unless the authorization is  terminated or revoked. Performed at [facility], [address] [place], [place] [address]      Labs: BNP (last 3 results) Recent Labs    08/07/20 1135  BNP 259.6*   Basic Metabolic Panel: Recent Labs  Lab 08/07/20 1135 08/08/20 0556 08/09/20 0220 08/10/20 0537  NA 140 138 140 142  K 3.8 3.9 3.8 3.9  CL 105 105 104 104  CO2 GLUCOSE 140* 114* 123* 137*  BUN CREATININE 0.63 0.71 0.68 0.67  CALCIUM 8.9 8.7* 8.7* 8.9  MG  --   --  1.7 1.7   Liver Function Tests: Recent Labs  Lab 08/07/20 1135  AST 19  ALT 12  ALKPHOS 64  BILITOT 0.8  PROT 7.3  ALBUMIN 4.2   Recent Labs  Lab 08/07/20 1135  LIPASE 23    No results for input(s): AMMONIA in the last 168 hours. CBC: Recent Labs  Lab 08/07/20 1135 08/08/20 0556 08/09/20 0220 08/10/20 0537  WBC 12.0* 12.2* 13.1* 12.5*  NEUTROABS 8.0*  --   --   --   HGB 13.1 12.4 12.2 12.9  HCT 40.3 37.1 37.0 39.6  MCV 83.8 83.6 84.5 85.3  PLT 275 268 276 289   Cardiac Enzymes: No results for input(s): CKTOTAL, CKMB, CKMBINDEX, TROPONINI in the last 168 hours. BNP: Invalid input(s): POCBNP CBG: Recent Labs  Lab 08/09/20 1113 08/09/20 1637 08/09/20 2017 08/10/20 0729 08/10/20 1210  GLUCAP 130* 120* 156* 146* 121*   D-Dimer No results for input(s): DDIMER in the last 72 hours. Hgb A1c No results for input(s): HGBA1C in the last 72 hours. Lipid Profile Recent Labs    08/08/20 0556  CHOL 116  HDL 46  LDLCALC 37  TRIG 164*  CHOLHDL 2.5   Thyroid function studies No results for input(s): TSH, T4TOTAL, T3FREE, THYROIDAB in the last 72 hours.  Invalid input(s): FREET3 Anemia work up No results for input(s): VITAMINB12, FOLATE, FERRITIN, TIBC, IRON, RETICCTPCT in the last 72 hours. Urinalysis    Component Value Date/Time   COLORURINE YELLOW (A) 08/07/2020 1212   APPEARANCEUR CLEAR (A) 08/07/2020 1212   APPEARANCEUR Clear 02/18/2013 2004   LABSPEC 1.011 08/07/2020 1212   LABSPEC 1.024 02/18/2013 2004   PHURINE 6.0 08/07/2020 1212   GLUCOSEU NEGATIVE 08/07/2020 1212   GLUCOSEU NEGATIVE 01/28/2015 0810   HGBUR NEGATIVE 08/07/2020 1212   BILIRUBINUR NEGATIVE 08/07/2020 1212   BILIRUBINUR neg 01/16/2015 1437   BILIRUBINUR Negative 02/18/2013 2004   KETONESUR NEGATIVE 08/07/2020 1212   PROTEINUR 100 (A) 08/07/2020 1212   UROBILINOGEN 0.2 01/28/2015 0810   NITRITE NEGATIVE 08/07/2020 1212   LEUKOCYTESUR NEGATIVE 08/07/2020 1212   LEUKOCYTESUR Trace 02/18/2013 2004   Sepsis Labs Invalid input(s): PROCALCITONIN,  WBC,  LACTICIDVEN Microbiology Recent Results (from the past 240 hour(s))  Respiratory Panel by RT PCR (Flu A&B,  Covid) - Nasopharyngeal Swab     Status: None   Collection Time: 08/07/20 11:43  AM   Specimen: Nasopharyngeal Swab  Result Value Ref Range Status   SARS Coronavirus 2 by RT PCR NEGATIVE NEGATIVE Final    Comment: (NOTE) SARS-CoV-2 target nucleic acids are NOT DETECTED.  The SARS-CoV-2 RNA is generally detectable in upper respiratoy specimens during the acute phase of infection. The lowest concentration of SARS-CoV-2 viral copies this assay can detect is 131 copies/mL. A negative result does not preclude SARS-Cov-2 infection and should not be used as the sole basis for treatment or other patient management decisions. A negative result may occur with  improper specimen collection/handling, submission of specimen other than nasopharyngeal swab, presence of viral mutation(s) within the areas targeted by this assay, and inadequate number of viral copies (<131 copies/mL). A negative result must be combined with clinical observations, patient history, and epidemiological information. The expected result is Negative.  Fact Sheet for Patients:  https://www.moore.com/  Fact Sheet for Healthcare Providers:  https://www.young.biz/  This test is no t yet approved or cleared by the [place] FDA and  has been authorized for detection and/or diagnosis of SARS-CoV-2 by FDA under an Emergency Use Authorization (EUA). This EUA will remain  in effect (meaning this test can be used) for the duration of the COVID-19 declaration under Section 564(b)(1) of the Act, 21 U.S.C. section 360bbb-3(b)(1), unless the authorization is terminated or revoked sooner.     Influenza A by PCR NEGATIVE NEGATIVE Final   Influenza B by PCR NEGATIVE NEGATIVE Final    Comment: (NOTE) The Xpert Xpress SARS-CoV-2/FLU/RSV assay is intended as an aid in  the diagnosis of influenza from Nasopharyngeal swab specimens and  should not be used as a sole basis for treatment. Nasal  washings and  aspirates are unacceptable for Xpert Xpress SARS-CoV-2/FLU/RSV  testing.  Fact Sheet for Patients: https://www.moore.com/  Fact Sheet for Healthcare Providers: https://www.young.biz/  This test is not yet approved or cleared by the [place] FDA and  has been authorized for detection and/or diagnosis of SARS-CoV-2 by  FDA under an Emergency Use Authorization (EUA). This EUA will remain  in effect (meaning this test can be used) for the duration of the  Covid-19 declaration under Section 564(b)(1) of the Act, 21  U.S.C. section 360bbb-3(b)(1), unless the authorization is  terminated or revoked. Performed at [facility], [address] [place], [place] [address]     Time coordinating discharge: Over 30 minutes  SIGNED:  Arnetha Courser, MD  [facility] 08/10/2020, 2:04 PM  If 7PM-7AM, please contact night-coverage [email or web address]  This record has been created using Conservation officer, historic buildings. Errors have been sought and corrected,but may not always be located. Such creation errors do not reflect on the standard of care.

## 2020-08-10 NOTE — Telephone Encounter (Signed)
TCM  to be completed in 2 days after discharge.

## 2020-08-10 NOTE — Telephone Encounter (Signed)
Pt needs a HFU as soon as possible per Liborio Nixon at the hospital-Pt is getting discharged today. No appts avail-Please call to schedule

## 2020-08-10 NOTE — Plan of Care (Signed)
Pt ready for discharge. IV and tele removed. Pt and daughter updated on discharge plan of care.   Time allowed for questions and concerns.  Denies any additional wants or needs at this time Call bell within reach.  Will continue to closely monitor.    Problem: Education: Goal: Knowledge of General Education information will improve Description: Including pain rating scale, medication(s)/side effects and non-pharmacologic comfort measures Outcome: Adequate for Discharge   Problem: Health Behavior/Discharge Planning: Goal: Ability to manage health-related needs will improve Outcome: Adequate for Discharge   Problem: Clinical Measurements: Goal: Ability to maintain clinical measurements within normal limits will improve Outcome: Adequate for Discharge Goal: Will remain free from infection Outcome: Adequate for Discharge Goal: Diagnostic test results will improve Outcome: Adequate for Discharge Goal: Respiratory complications will improve Outcome: Adequate for Discharge Goal: Cardiovascular complication will be avoided Outcome: Adequate for Discharge   Problem: Activity: Goal: Risk for activity intolerance will decrease Outcome: Adequate for Discharge   Problem: Nutrition: Goal: Adequate nutrition will be maintained Outcome: Adequate for Discharge   Problem: Coping: Goal: Level of anxiety will decrease Outcome: Adequate for Discharge   Problem: Elimination: Goal: Will not experience complications related to bowel motility Outcome: Adequate for Discharge Goal: Will not experience complications related to urinary retention Outcome: Adequate for Discharge   Problem: Pain Managment: Goal: General experience of comfort will improve Outcome: Adequate for Discharge   Problem: Safety: Goal: Ability to remain free from injury will improve Outcome: Adequate for Discharge   Problem: Skin Integrity: Goal: Risk for impaired skin integrity will decrease Outcome: Adequate for  Discharge   Problem: Education: Goal: Understanding of cardiac disease, CV risk reduction, and recovery process will improve Outcome: Adequate for Discharge Goal: Individualized Educational Video(s) Outcome: Adequate for Discharge   Problem: Activity: Goal: Ability to tolerate increased activity will improve Outcome: Adequate for Discharge   Problem: Cardiac: Goal: Ability to achieve and maintain adequate cardiovascular perfusion will improve Outcome: Adequate for Discharge   Problem: Health Behavior/Discharge Planning: Goal: Ability to safely manage health-related needs after discharge will improve Outcome: Adequate for Discharge

## 2020-08-10 NOTE — Interval H&P Note (Signed)
Cath Lab Visit (complete for each Cath Lab visit)  Clinical Evaluation Leading to the Procedure:   ACS: Yes.    Non-ACS:  n/a    History and Physical Interval Note:  08/10/2020 8:02 AM  Tanya Cole  has presented today for surgery, with the diagnosis of NSTEMI.  The various methods of treatment have been discussed with the patient and family. After consideration of risks, benefits and other options for treatment, the patient has consented to  Procedure(s): LEFT HEART CATH AND CORONARY ANGIOGRAPHY possible percutaneous intervention (N/A) as a surgical intervention.  The patient's history has been reviewed, patient examined, no change in status, stable for surgery.  I have reviewed the patient's chart and labs.  Questions were answered to the patient's satisfaction.     Lorine Bears

## 2020-08-10 NOTE — Progress Notes (Signed)
Pt has RLL high pitch wheeze . Pt stated that is a chronic condition.

## 2020-08-10 NOTE — Progress Notes (Signed)
Progress Note  Patient Name: Tanya Cole Date of Encounter: 08/10/2020  [facility] Cardiologist: Lorine Bears, MD   Subjective   She denies chest pain this morning. She underwent left heart catheterization via the right radial artery which showed patent LAD stent with mild in-stent restenosis, stable 60% mid LAD stenosis and 90% stenosis in the distal LAD close to the apex. The distal lesion is not amenable to PCI due to distal location and very small vessel size in that area.  Inpatient Medications    Scheduled Meds: . aspirin      . [MAR Hold] aspirin EC  81 mg Oral Daily  . [MAR Hold] atorvastatin  80 mg Oral Daily  . [MAR Hold] ferrous sulfate  325 mg Oral Q breakfast  . [MAR Hold] insulin aspart  0-5 Units Subcutaneous QHS  . [MAR Hold] insulin aspart  0-9 Units Subcutaneous TID WC  . [MAR Hold] ipratropium-albuterol  3 mL Nebulization QID  . [MAR Hold] isosorbide mononitrate  30 mg Oral Daily  . [MAR Hold] lisinopril  20 mg Oral Daily  . [MAR Hold] metoprolol tartrate  25 mg Oral BID  . [MAR Hold] multivitamin-lutein  1 capsule Oral Daily  . sodium chloride flush  3 mL Intravenous Q12H   Continuous Infusions: . sodium chloride    . sodium chloride    . sodium chloride 1 mL/kg/hr (08/10/20 0700)  . heparin Stopped (08/10/20 0744)   PRN Meds: sodium chloride, [MAR Hold] acetaminophen, [MAR Hold] albuterol, [MAR Hold] dextromethorphan-guaiFENesin, [MAR Hold] fluticasone, [MAR Hold] hydrALAZINE, [MAR Hold] nitroGLYCERIN, [MAR Hold] ondansetron (ZOFRAN) IV, sodium chloride flush   Vital Signs    Vitals:   08/10/20 0724 08/10/20 0831 08/10/20 0847 08/10/20 0900  BP: (!) 152/62 (!) 155/127 (!) 141/61 (!) 141/56  Pulse: 86 76 70 71  Resp: 16 (!) 22 (!) 21 (!) 24  Temp: 98.7 F (37.1 C)     TempSrc: Oral     SpO2:  94% 92% 92%  Weight: 52.5 kg     Height: 5\' 1"  (1.549 m)       Intake/Output Summary (Last 24 hours) at 08/10/2020 0908 Last data filed at  08/10/2020 0700 Gross per 24 hour  Intake 338.65 ml  Output 650 ml  Net -311.35 ml   Last 3 Weights 08/10/2020 08/10/2020 08/09/2020  Weight (lbs) 115 lb 11.9 oz 115 lb 12.8 oz 114 lb 4.8 oz  Weight (kg) 52.5 kg 52.527 kg 51.846 kg      Telemetry    Normal sinus rhythm- Personally Reviewed  ECG     - Personally Reviewed  Physical Exam   GEN: No acute distress.   Neck: No JVD Cardiac: RRR, no murmurs, rubs, or gallops.  Respiratory:  Wheezing, scattered Rales GI: Soft, nontender, non-distended  MS: No edema; No deformity. Neuro:  Nonfocal  Psych: Normal affect   Labs    High Sensitivity Troponin:   Recent Labs  Lab 08/07/20 1135 08/07/20 1337 08/07/20 1619 08/07/20 1903  TROPONINIHS 195* 187* 169* 138*      Chemistry Recent Labs  Lab 08/07/20 1135 08/07/20 1135 08/08/20 0556 08/09/20 0220 08/10/20 0537  NA 140   < > 138 140 142  K 3.8   < > 3.9 3.8 3.9  CL 105   < > 105 104 104  CO2 23   < > 27 25 26   GLUCOSE 140*   < > 114* 123* 137*  BUN 9   < > 10 9  9  CREATININE 0.63   < > 0.71 0.68 0.67  CALCIUM 8.9   < > 8.7* 8.7* 8.9  PROT 7.3  --   --   --   --   ALBUMIN 4.2  --   --   --   --   AST 19  --   --   --   --   ALT 12  --   --   --   --   ALKPHOS 64  --   --   --   --   BILITOT 0.8  --   --   --   --   GFRNONAA >60   < > >60 >60 >60  ANIONGAP 12   < > 6 11 12    < > = values in this interval not displayed.     Hematology Recent Labs  Lab 08/08/20 0556 08/09/20 0220 08/10/20 0537  WBC 12.2* 13.1* 12.5*  RBC 4.44 4.38 4.64  HGB 12.4 12.2 12.9  HCT 37.1 37.0 39.6  MCV 83.6 84.5 85.3  MCH 27.9 27.9 27.8  MCHC 33.4 33.0 32.6  RDW 16.4* 16.5* 16.6*  PLT 268 276 289    BNP Recent Labs  Lab 08/07/20 1135  BNP 259.6*     DDimer No results for input(s): DDIMER in the last 168 hours.   Radiology    CARDIAC CATHETERIZATION  Result Date: 08/10/2020  Mid LAD lesion is 60% stenosed.  Prox LAD lesion is 30% stenosed.  Dist LAD  lesion is 90% stenosed.  Prox RCA lesion is 30% stenosed.  The left ventricular systolic function is normal.  LV end diastolic pressure is mildly elevated.  The left ventricular ejection fraction is 55-65% by visual estimate.  1.  Significant one-vessel coronary artery disease with patent proximal LAD stent with mild in-stent restenosis.  Stable mid LAD stenosis at 60% and significant distal LAD stenosis 90% close to the apex. 2.  Normal LV systolic function mildly elevated left ventricular end-diastolic pressure. Recommendations: Continue medical therapy.  Distal LAD stenosis is close to the apex and not amenable to PCI.    Cardiac Studies   Echocardiogram yesterday Normal ejection fraction, diastolic dysfunction, No significant valve disease  Patient Profile     86 y.o. female with pmh of CAD s/p NSTEMI in 09/2018 s/p LAD DES, HTN, HLD, DM2, palpitations with short runs of PSVT on recent monitoring, diastolic dysfunction, and mild AI who is being seen for chest pain with mild troponin elevation   Assessment & Plan    Unstable angina Mildly elevated troponin likely supply demand ischemia not myocardial infarction. left heart catheterization via the right radial artery which showed patent LAD stent with mild in-stent restenosis, stable 60% mid LAD stenosis and 90% stenosis in the distal LAD close to the apex. The distal lesion is not amenable to PCI due to distal location and very small vessel size in that area. Recommend continued medical therapy. The patient can be discharged home from a cardiac standpoint on current medications. Imdur was added.  Essential hypertension Continue metoprolol, lisinopril,  Imdur was added during this admission with improvement in blood pressure. Elevated blood pressure might have contributed.  Hyperlipidemia Continue high intensity statin Goal LDL less than 70  COPD Chronic cough, appears to have chronic bronchitis with underlying lung  disease Uses inhalers and nebulizers at home This admission with some cough, consistent with chronic bronchitis      For questions or updates, please contact [facility] Please  consult [email or web address] for contact info under        Signed, Lorine Bears, MD  08/10/2020, 9:08 AM

## 2020-08-10 NOTE — Telephone Encounter (Signed)
Does she need a TCM?

## 2020-08-10 NOTE — Progress Notes (Signed)
Pt arrived to unit from cath lab. Daughter at bedside Assessment completed as charted. Pt appears to be in no apparent distress Vitals obtained.  Right wrist site soft, clean, dry, and intact.  No bruising noted! Pt and daughter updated on plan of care Denies any additional wants or needs at this time.  Call bell within reach.  Will continue to closely monitor.

## 2020-08-11 NOTE — Telephone Encounter (Signed)
Pt's daughter asked to be called back today or tomorrow after 3:30

## 2020-08-11 NOTE — Telephone Encounter (Signed)
I am out of office until tomorrow. Will call as noted.

## 2020-08-12 ENCOUNTER — Telehealth: Payer: Self-pay

## 2020-08-12 ENCOUNTER — Other Ambulatory Visit: Payer: Self-pay

## 2020-08-12 ENCOUNTER — Ambulatory Visit (INDEPENDENT_AMBULATORY_CARE_PROVIDER_SITE_OTHER): Payer: Medicare Other

## 2020-08-12 DIAGNOSIS — E538 Deficiency of other specified B group vitamins: Secondary | ICD-10-CM | POA: Diagnosis not present

## 2020-08-12 MED ORDER — CYANOCOBALAMIN 1000 MCG/ML IJ SOLN
1000.0000 ug | Freq: Once | INTRAMUSCULAR | Status: AC
  Administered 2020-08-12: 1000 ug via INTRAMUSCULAR

## 2020-08-12 NOTE — Progress Notes (Addendum)
Patient presented for B 12 injection to left deltoid, patient voiced no concerns nor showed any signs of distress during injection.  Reviewed.  Dr Scott 

## 2020-08-12 NOTE — Telephone Encounter (Signed)
Transition Care Management Follow-up Telephone Call  Date of discharge and from where: 08/10/20 from [facility]  How have you been since you were released from the hospital? B12 injection received today at [facility]. Denies shortness of breath, chest pain, headache, blurred vision, fever. Reports feeling good outside of lower legs hurting like they normally do with arthritic pain. Inhalers and nebulizer available as needed.  Any questions or concerns? No  Items Reviewed:  Did the pt receive and understand the discharge instructions provided? Yes , increase activity slowly.   Medications obtained and verified? Yes , taking scheduled medications as directed.   Any new allergies since your discharge? No   Dietary orders reviewed? Heart healthy, low sodium.   Do you have support at home? Yes . Family assist as needed.   Functional Questionnaire: (I = Independent and D = Dependent) ADLs:i   Bathing/Dressing- I- waits until someone is in the house before bathing.   Meal Prep- I -family assist during this time  Eating- i  Maintaining continence- i  Transferring/Ambulation- i  Managing Meds- i  Follow up appointments reviewed:   PCP Hospital f/u appt confirmed? Yes  Scheduled to see Dr. Lorin Picket on  08/17/20 @ 2:00 via telephone. Plans to change to OV if family can accompany.   Specialist Hospital f/u appt confirmed? Yes, CVD scheduled 08/18/20 @ 3:40.  Are transportation arrangements needed? No   If their condition worsens, is the pt aware to call PCP or go to the Emergency Dept.? Yes  Was the patient provided with contact information for the PCP's office or ED? Yes  Was to pt encouraged to call back with questions or concerns? Yes

## 2020-08-17 ENCOUNTER — Ambulatory Visit (INDEPENDENT_AMBULATORY_CARE_PROVIDER_SITE_OTHER): Payer: Medicare Other | Admitting: Internal Medicine

## 2020-08-17 VITALS — Ht 61.0 in | Wt 115.0 lb

## 2020-08-17 DIAGNOSIS — D72829 Elevated white blood cell count, unspecified: Secondary | ICD-10-CM | POA: Diagnosis not present

## 2020-08-17 DIAGNOSIS — Z1231 Encounter for screening mammogram for malignant neoplasm of breast: Secondary | ICD-10-CM | POA: Diagnosis not present

## 2020-08-17 DIAGNOSIS — I10 Essential (primary) hypertension: Secondary | ICD-10-CM

## 2020-08-17 DIAGNOSIS — J4520 Mild intermittent asthma, uncomplicated: Secondary | ICD-10-CM

## 2020-08-17 DIAGNOSIS — R413 Other amnesia: Secondary | ICD-10-CM

## 2020-08-17 DIAGNOSIS — I252 Old myocardial infarction: Secondary | ICD-10-CM

## 2020-08-17 DIAGNOSIS — E1159 Type 2 diabetes mellitus with other circulatory complications: Secondary | ICD-10-CM

## 2020-08-17 DIAGNOSIS — E7800 Pure hypercholesterolemia, unspecified: Secondary | ICD-10-CM

## 2020-08-17 DIAGNOSIS — R5383 Other fatigue: Secondary | ICD-10-CM

## 2020-08-17 NOTE — Progress Notes (Signed)
[name], female   DOB: May 01, 1933, 86 y.o.   MRN: [id number]   Virtual Visit via telephone Note  This visit type was conducted due to national recommendations for restrictions regarding the COVID-19 pandemic (e.g. social distancing).  This format is felt to be most appropriate for this patient at this time.  All issues noted in this document were discussed and addressed.  No physical exam was performed (except for noted visual exam findings with Video Visits).   I connected with [name] by telephone and verified that I am speaking with the correct person using two identifiers. Location patient: home Location provider: work  Persons participating in the virtual visit: patient, provider and pts daughter-n-law Associate Professor)  The limitations, risks, security and privacy concerns of performing an evaluation and management service by telephone and the availability of in person appointments have been discussed.  It has also been discussed with the patient that there may be a patient responsible charge related to this service. The patient expressed understanding and agreed to proceed.   Reason for visit:  Hospital follow up.   HPI: Hospital follow up. Admitted 08/07/20 - 08/10/20 after presenting with sob and chest pain - dx with NSTEMI.  Has a history of NSTEMI with stent to LAD 09/2018. Found to have mildly elevated troponin which was felt to be due to demand ischemia.  Cardiology consulted.  Cath - stable 60% mid LAD stenosis and 90% stenosis in the distal LAD - not amenable to intervention.  Recommended maximizing medical management and outpt f/u.  Treated for COPD exacerbation with home inhalers.  Since her discharge, she denies any chest pain or increased sob.  Feels her breathing is stable.  She does feel tired.  Lives by herself.  Niece lives beside her and family checks in on her.  Daughter-n-law concerned because pt is becoming more forgetful.  Discussed importance of taking  her medication correctly.  Has a pill box.  Discussed having someone try to manage her medications and confirm she is taking.  She is eating, but Maxine Glenn states she is not eating well.  Some decreased appetite.  No nausea or vomiting.  Bowels moving.  States her am sugars averaging 120s.  Not check in pm.  Given decreased energy, discussed PT.  Also discussed having home health nursing to help with medication compliance, etc.  They are agreeable.     ROS: See pertinent positives and negatives per HPI.  Past Medical History:  Diagnosis Date  . Aortic insufficiency    a. TTE 12/19: EF 55-60%, probable HK of the mid apical anterior septal myocardium, Gr1DD, mild AI, mildly dilated LA  . Asthma   . CAD (coronary artery disease)    a. NSTEMI 12/19; b. LHC 10/08/18: LM minimal luminal irregs, mLAD-1 95% s/p PCI/DES, mLAD-2 60%, LCx mild diffuse disease throughout, RCA minimal luminal irregs; b. 03/2020 MV: EF>65%, no ischemia/scar.  . Diabetes mellitus (HCC)   . Hypercholesterolemia   . Hypertension   . Myocardial infarction (HCC)   . Osteopenia   . Palpitations    a. 04/2020 Zio: Avg HR 75. 429 SVT episodes, longest 19 secs @ 133. Occas PACs (3.2%). Rare PVCs (<1%).  . Polymyalgia rheumatica syndrome (HCC)   . Reactive airway disease     Past Surgical History:  Procedure Laterality Date  . ABDOMINAL HYSTERECTOMY  1981   prolapse and bleeding, ovaries not removed  . BREAST EXCISIONAL BIOPSY Right   . CHOLECYSTECTOMY N/A 09/02/2019  Procedure: LAPAROSCOPIC CHOLECYSTECTOMY WITH INTRAOPERATIVE CHOLANGIOGRAM;  Surgeon: Henrene Dodge, MD;  Location: [facility] ORS;  Service: General;  Laterality: N/A;  . CORONARY STENT INTERVENTION N/A 10/08/2018   Procedure: CORONARY STENT INTERVENTION;  Surgeon: Iran Ouch, MD;  Location: [facility] INVASIVE CV LAB;  Service: Cardiovascular;  Laterality: N/A;  . ENDOSCOPIC RETROGRADE CHOLANGIOPANCREATOGRAPHY (ERCP) WITH PROPOFOL N/A 08/08/2019   Procedure: ENDOSCOPIC  RETROGRADE CHOLANGIOPANCREATOGRAPHY (ERCP) WITH PROPOFOL;  Surgeon: Midge Minium, MD;  Location: [facility] ENDOSCOPY;  Service: Endoscopy;  Laterality: N/A;  . LEFT HEART CATH AND CORONARY ANGIOGRAPHY N/A 10/08/2018   Procedure: LEFT HEART CATH AND CORONARY ANGIOGRAPHY;  Surgeon: Iran Ouch, MD;  Location: [facility] INVASIVE CV LAB;  Service: Cardiovascular;  Laterality: N/A;  . LEFT HEART CATH AND CORONARY ANGIOGRAPHY N/A 08/10/2020   Procedure: LEFT HEART CATH AND CORONARY ANGIOGRAPHY possible percutaneous intervention;  Surgeon: Iran Ouch, MD;  Location: [facility] INVASIVE CV LAB;  Service: Cardiovascular;  Laterality: N/A;  . UMBILICAL HERNIA REPAIR  7/94    Family History  Problem Relation Age of Onset  . Heart attack Father   . Arthritis Mother   . Heart disease Mother   . Throat cancer Sister   . Parkinson's disease Sister   . COPD Brother     SOCIAL HX: reviewed.    Current Outpatient Medications:  .  ADVAIR DISKUS 250-50 MCG/DOSE AEPB, INHALE ONE PUFF BY MOUTH EVERY 12 HOURS. RINSE MOUTH AFTER EACH USE (Patient not taking: Reported on 08/07/2020), Disp: 60 each, Rfl: 5 .  albuterol (PROVENTIL) (2.5 MG/3ML) 0.083% nebulizer solution, Take 3 mLs (2.5 mg total) by nebulization every 4 (four) hours as needed for wheezing or shortness of breath., Disp: 75 mL, Rfl: 1 .  alendronate (FOSAMAX) 70 MG tablet, Take 70 mg by mouth once a week. , Disp: , Rfl:  .  aspirin 81 MG tablet, Take 81 mg by mouth daily., Disp: , Rfl:  .  atorvastatin (LIPITOR) 80 MG tablet, Take 1 tablet (80 mg total) by mouth daily at 6 PM., Disp: 90 tablet, Rfl: 3 .  ferrous sulfate 325 (65 FE) MG tablet, Take 325 mg by mouth daily with breakfast., Disp: , Rfl:  .  fluticasone (FLONASE) 50 MCG/ACT nasal spray, Use 2 spray(s) in each nostril once daily, Disp: 16 g, Rfl: 0 .  isosorbide mononitrate (IMDUR) 30 MG 24 hr tablet, Take 1 tablet (30 mg total) by mouth daily., Disp: 30 tablet, Rfl: 0 .  lisinopril (ZESTRIL)  20 MG tablet, Take 1 tablet by mouth once daily, Disp: 90 tablet, Rfl: 1 .  metFORMIN (GLUCOPHAGE) 500 MG tablet, TAKE 1 TABLET BY MOUTH TWICE DAILY WITH A MEAL, Disp: 180 tablet, Rfl: 0 .  metoprolol tartrate (LOPRESSOR) 25 MG tablet, Take 1 tablet (25 mg total) by mouth 2 (two) times daily., Disp: 90 tablet, Rfl: 1 .  Multiple Vitamins-Minerals (VISION FORMULA/LUTEIN PO), Take 1 tablet by mouth daily. , Disp: , Rfl:  .  Nebulizers (COMPRESSOR/NEBULIZER) MISC, 1 Units by Does not apply route as directed., Disp: 1 each, Rfl: 0 .  nitroGLYCERIN (NITROSTAT) 0.4 MG SL tablet, Place 1 tablet (0.4 mg total) under the tongue every 5 (five) minutes as needed for chest pain., Disp: 30 tablet, Rfl: 12 .  PROAIR HFA 108 (90 Base) MCG/ACT inhaler, Inhale 2 puffs into the lungs every 4 (four) hours as needed for wheezing or shortness of breath., Disp: 18 g, Rfl: 1 .  Vitamin D, Ergocalciferol, (DRISDOL) 50000 units CAPS capsule, Take 50,000 Units  by mouth once a week., Disp: , Rfl: 3  EXAM:  GENERAL: alert. Answering questions appropriately.  Sounds to be in no acute distress.   PSYCH/NEURO: pleasant and cooperative, no obvious depression or anxiety, speech and thought processing grossly intact  ASSESSMENT AND PLAN:  Discussed the following assessment and plan:  Problem List Items Addressed This Visit    Reactive airway disease    Continue current inhaler regimen.  Breathing stable.       Memory change    Concern regarding - "more forgetful".  Discussed today.  Concern about medication compliance, etc.  Discussed home health and nursing to help with medication management.  Discussed the need further w/up regarding her memory issues.        Leukocytosis    Slight increased white blood cell count during recent hospitalization.  Will recheck cbc to confirm stable/normal.       Hypertension    Blood pressure has been doing well on lisinopril.  Follow pressures.  Follow metabolic panel.        Hypercholesterolemia    On lipitor.  Follow lipid panel and liver function tests.        History of non-ST elevation myocardial infarction (NSTEMI)    Recent admission for NSTEMI as outlined.  Cardiac cath as outlined.  Elected Microbiologist.  Continue lipitor, metoprolol and imdur.  No chest pain since discharge.  Breathing stable.  Plan f/u with cardiology.         Fatigue    Fatigue.  Decreased energy.  Discussed home health PT.  In agreement.  Also, discussed nursing to help with medication compliance, etc.  Discussed home health, PACE, etc.  Will arrange.       Diabetes mellitus with cardiac complication (HCC)    Last a1c increased - 7.4.  Sugars as outlined.  Low carb diet and exercise.  Follow.        Other Visit Diagnoses    Visit for screening mammogram    -  Primary   Relevant Orders   MM 3D SCREEN BREAST BILATERAL       I discussed the assessment and treatment plan with the patient. The patient was provided an opportunity to ask questions and all were answered. The patient agreed with the plan and demonstrated an understanding of the instructions.   The patient was advised to call back or seek an in-person evaluation if the symptoms worsen or if the condition fails to improve as anticipated.  I provided 25 minutes of non-face-to-face time during this encounter.   Dale Hebron, MD

## 2020-08-18 ENCOUNTER — Ambulatory Visit: Payer: Medicare Other | Admitting: Cardiovascular Disease

## 2020-08-18 ENCOUNTER — Encounter: Payer: Self-pay | Admitting: Internal Medicine

## 2020-08-18 ENCOUNTER — Telehealth: Payer: Self-pay | Admitting: Internal Medicine

## 2020-08-18 MED ORDER — ALBUTEROL SULFATE (2.5 MG/3ML) 0.083% IN NEBU: 2.5000 mg | INHALATION_SOLUTION | RESPIRATORY_TRACT | 1 refills | Status: DC | PRN

## 2020-08-18 NOTE — Assessment & Plan Note (Signed)
Continue current inhaler regimen.  Breathing stable.   

## 2020-08-18 NOTE — Telephone Encounter (Signed)
Pt needs am refill on albuterol (PROVENTIL) (2.5 MG/3ML) 0.083% nebulizer solution sent to [facility]

## 2020-08-18 NOTE — Assessment & Plan Note (Signed)
Concern regarding - "more forgetful".  Discussed today.  Concern about medication compliance, etc.  Discussed home health and nursing to help with medication management.  Discussed the need further w/up regarding her memory issues.

## 2020-08-18 NOTE — Assessment & Plan Note (Signed)
On lipitor.  Follow lipid panel and liver function tests.   

## 2020-08-18 NOTE — Assessment & Plan Note (Signed)
Recent admission for NSTEMI as outlined.  Cardiac cath as outlined.  Elected Microbiologist.  Continue lipitor, metoprolol and imdur.  No chest pain since discharge.  Breathing stable.  Plan f/u with cardiology.

## 2020-08-18 NOTE — Assessment & Plan Note (Signed)
Blood pressure has been doing well on lisinopril.  Follow pressures.  Follow metabolic panel.

## 2020-08-18 NOTE — Assessment & Plan Note (Signed)
Last a1c increased - 7.4.  Sugars as outlined.  Low carb diet and exercise.  Follow.

## 2020-08-18 NOTE — Assessment & Plan Note (Signed)
Fatigue.  Decreased energy.  Discussed home health PT.  In agreement.  Also, discussed nursing to help with medication compliance, etc.  Discussed home health, PACE, etc.  Will arrange.

## 2020-08-18 NOTE — Assessment & Plan Note (Signed)
Slight increased white blood cell count during recent hospitalization.  Will recheck cbc to confirm stable/normal.

## 2020-08-19 ENCOUNTER — Other Ambulatory Visit: Payer: Self-pay

## 2020-08-19 ENCOUNTER — Observation Stay
Admission: EM | Admit: 2020-08-19 | Discharge: 2020-08-20 | Disposition: A | Discharge status: Home / Self Care | Payer: Medicare Other | Attending: Internal Medicine | Admitting: Internal Medicine

## 2020-08-19 ENCOUNTER — Telehealth: Payer: Self-pay

## 2020-08-19 ENCOUNTER — Emergency Department: Payer: Medicare Other

## 2020-08-19 ENCOUNTER — Encounter: Payer: Self-pay | Admitting: Emergency Medicine

## 2020-08-19 DIAGNOSIS — Z79899 Other long term (current) drug therapy: Secondary | ICD-10-CM | POA: Insufficient documentation

## 2020-08-19 DIAGNOSIS — Z20822 Contact with and (suspected) exposure to covid-19: Secondary | ICD-10-CM | POA: Insufficient documentation

## 2020-08-19 DIAGNOSIS — R0689 Other abnormalities of breathing: Secondary | ICD-10-CM | POA: Diagnosis not present

## 2020-08-19 DIAGNOSIS — J441 Chronic obstructive pulmonary disease with (acute) exacerbation: Principal | ICD-10-CM

## 2020-08-19 DIAGNOSIS — E119 Type 2 diabetes mellitus without complications: Secondary | ICD-10-CM | POA: Diagnosis not present

## 2020-08-19 DIAGNOSIS — I25118 Atherosclerotic heart disease of native coronary artery with other forms of angina pectoris: Secondary | ICD-10-CM | POA: Diagnosis present

## 2020-08-19 DIAGNOSIS — I252 Old myocardial infarction: Secondary | ICD-10-CM

## 2020-08-19 DIAGNOSIS — I2510 Atherosclerotic heart disease of native coronary artery without angina pectoris: Secondary | ICD-10-CM | POA: Insufficient documentation

## 2020-08-19 DIAGNOSIS — Z7982 Long term (current) use of aspirin: Secondary | ICD-10-CM | POA: Diagnosis not present

## 2020-08-19 DIAGNOSIS — R062 Wheezing: Secondary | ICD-10-CM | POA: Diagnosis not present

## 2020-08-19 DIAGNOSIS — R079 Chest pain, unspecified: Secondary | ICD-10-CM | POA: Diagnosis not present

## 2020-08-19 DIAGNOSIS — Z7952 Long term (current) use of systemic steroids: Secondary | ICD-10-CM | POA: Insufficient documentation

## 2020-08-19 DIAGNOSIS — J45909 Unspecified asthma, uncomplicated: Secondary | ICD-10-CM | POA: Diagnosis not present

## 2020-08-19 DIAGNOSIS — I119 Hypertensive heart disease without heart failure: Secondary | ICD-10-CM | POA: Diagnosis not present

## 2020-08-19 DIAGNOSIS — R0789 Other chest pain: Secondary | ICD-10-CM | POA: Diagnosis present

## 2020-08-19 DIAGNOSIS — Z7984 Long term (current) use of oral hypoglycemic drugs: Secondary | ICD-10-CM | POA: Insufficient documentation

## 2020-08-19 DIAGNOSIS — E1159 Type 2 diabetes mellitus with other circulatory complications: Secondary | ICD-10-CM | POA: Diagnosis present

## 2020-08-19 DIAGNOSIS — J8410 Pulmonary fibrosis, unspecified: Secondary | ICD-10-CM | POA: Diagnosis not present

## 2020-08-19 DIAGNOSIS — I499 Cardiac arrhythmia, unspecified: Secondary | ICD-10-CM | POA: Diagnosis not present

## 2020-08-19 DIAGNOSIS — J9811 Atelectasis: Secondary | ICD-10-CM | POA: Diagnosis not present

## 2020-08-19 DIAGNOSIS — R0602 Shortness of breath: Secondary | ICD-10-CM | POA: Diagnosis not present

## 2020-08-19 DIAGNOSIS — I10 Essential (primary) hypertension: Secondary | ICD-10-CM | POA: Diagnosis not present

## 2020-08-19 LAB — COMPREHENSIVE METABOLIC PANEL
ALT: 14 U/L (ref 0–44)
AST: 17 U/L (ref 15–41)
Albumin: 4.5 g/dL (ref 3.5–5.0)
Alkaline Phosphatase: 69 U/L (ref 38–126)
Anion gap: 13 (ref 5–15)
BUN: 16 mg/dL (ref 8–23)
CO2: 24 mmol/L (ref 22–32)
Calcium: 9.7 mg/dL (ref 8.9–10.3)
Chloride: 101 mmol/L (ref 98–111)
Creatinine, Ser: 0.74 mg/dL (ref 0.44–1.00)
GFR, Estimated: >60 mL/min (ref >60)
Glucose, Bld: 162 mg/dL — ABNORMAL HIGH (ref 70–99)
Potassium: 4 mmol/L (ref 3.5–5.1)
Sodium: 138 mmol/L (ref 135–145)
Total Bilirubin: 1 mg/dL (ref 0.3–1.2)
Total Protein: 7.7 g/dL (ref 6.5–8.1)

## 2020-08-19 LAB — CBC WITH DIFFERENTIAL/PLATELET
Abs Immature Granulocytes: 0.04 10*3/uL (ref 0.00–0.07)
Basophils Absolute: 0.1 10*3/uL (ref 0.0–0.1)
Basophils Relative: 1 %
Eosinophils Absolute: 1.6 10*3/uL — ABNORMAL HIGH (ref 0.0–0.5)
Eosinophils Relative: 12 %
HCT: 42.8 % (ref 36.0–46.0)
Hemoglobin: 13.9 g/dL (ref 12.0–15.0)
Immature Granulocytes: 0 %
Lymphocytes Relative: 22 %
Lymphs Abs: 3.1 10*3/uL (ref 0.7–4.0)
MCH: 27.5 pg (ref 26.0–34.0)
MCHC: 32.5 g/dL (ref 30.0–36.0)
MCV: 84.8 fL (ref 80.0–100.0)
Monocytes Absolute: 0.8 10*3/uL (ref 0.1–1.0)
Monocytes Relative: 6 %
Neutro Abs: 8.3 10*3/uL — ABNORMAL HIGH (ref 1.7–7.7)
Neutrophils Relative %: 59 %
Platelets: 410 10*3/uL — ABNORMAL HIGH (ref 150–400)
RBC: 5.05 MIL/uL (ref 3.87–5.11)
RDW: 15.9 % — ABNORMAL HIGH (ref 11.5–15.5)
WBC: 14 10*3/uL — ABNORMAL HIGH (ref 4.0–10.5)
nRBC: 0 % (ref 0.0–0.2)

## 2020-08-19 LAB — TROPONIN I (HIGH SENSITIVITY)
Troponin I (High Sensitivity): 6 ng/L (ref <18)
Troponin I (High Sensitivity): 6 ng/L (ref <18)

## 2020-08-19 LAB — RESPIRATORY PANEL BY RT PCR (FLU A&B, COVID)
Influenza A by PCR: NEGATIVE
Influenza B by PCR: NEGATIVE
SARS Coronavirus 2 by RT PCR: NEGATIVE

## 2020-08-19 MED ORDER — PREDNISONE 20 MG PO TABS: 40.0000 mg | ORAL_TABLET | Freq: Every day | ORAL | Status: DC

## 2020-08-19 MED ORDER — IPRATROPIUM-ALBUTEROL 0.5-2.5 (3) MG/3ML IN SOLN
3.0000 mL | Freq: Once | RESPIRATORY_TRACT | Status: AC
  Administered 2020-08-19: 3 mL via RESPIRATORY_TRACT
  Filled 2020-08-19: qty 3

## 2020-08-19 MED ORDER — ENOXAPARIN SODIUM 40 MG/0.4ML ~~LOC~~ SOLN
40.0000 mg | SUBCUTANEOUS | Status: DC
  Administered 2020-08-20: 40 mg via SUBCUTANEOUS
  Filled 2020-08-19: qty 0.4

## 2020-08-19 MED ORDER — METHYLPREDNISOLONE SODIUM SUCC 125 MG IJ SOLR
80.0000 mg | Freq: Once | INTRAMUSCULAR | Status: AC
  Administered 2020-08-19: 80 mg via INTRAVENOUS
  Filled 2020-08-19: qty 2

## 2020-08-19 MED ORDER — IPRATROPIUM-ALBUTEROL 0.5-2.5 (3) MG/3ML IN SOLN
3.0000 mL | Freq: Four times a day (QID) | RESPIRATORY_TRACT | Status: DC
  Administered 2020-08-20 (×2): 3 mL via RESPIRATORY_TRACT
  Filled 2020-08-19 (×2): qty 3

## 2020-08-19 MED ORDER — ALBUTEROL SULFATE (2.5 MG/3ML) 0.083% IN NEBU: 2.5000 mg | INHALATION_SOLUTION | RESPIRATORY_TRACT | Status: DC | PRN

## 2020-08-19 MED ORDER — IOHEXOL 350 MG/ML SOLN
75.0000 mL | Freq: Once | INTRAVENOUS | Status: AC | PRN
  Administered 2020-08-19: 75 mL via INTRAVENOUS

## 2020-08-19 MED ORDER — INSULIN ASPART 100 UNIT/ML ~~LOC~~ SOLN
0.0000 [IU] | Freq: Three times a day (TID) | SUBCUTANEOUS | Status: DC
  Administered 2020-08-20: 11 [IU] via SUBCUTANEOUS
  Administered 2020-08-20: 5 [IU] via SUBCUTANEOUS
  Filled 2020-08-19 (×2): qty 1

## 2020-08-19 MED ORDER — METHYLPREDNISOLONE SODIUM SUCC 40 MG IJ SOLR
40.0000 mg | Freq: Four times a day (QID) | INTRAMUSCULAR | Status: DC
  Administered 2020-08-20 (×3): 40 mg via INTRAVENOUS
  Filled 2020-08-19 (×2): qty 1

## 2020-08-19 NOTE — H&P (Signed)
History and Physical    [name] HQI:[id number] DOB: [birth year] DOA: 08/19/2020  PCP: Dale [place], MD   Patient coming from: home  I have personally briefly reviewed patient's old medical records in [facility]  Chief Complaint: Chest pain, wheezing  HPI: Tanya Cole is a 86 y.o. female with medical history significant for diabetes, CAD, DES to LAD in December 2019, HTN, HLD, recently hospitalized from 10/8-10/11 with chest pain, undergoing cardiac cath which showed stable 60% mid LAD stenosis and a 90% stenosis in the distal LAD close to apex not amenable to intervention with recommendation for maximization of medical therapy, who presents with recurrent chest pain associated with shortness of breath.  She states the chest pain woke her from sleep and it radiates from the right side of her chest to the middle and left side of her chest.  It is of moderate intensity with no aggravating or alleviating factors.  She denies nausea vomiting or diaphoresis, lightheadedness or palpitations.  Denies fever or chills.  Denies nausea vomiting or diarrhea.  Has some wheezing and a mild cough. ED Course: On arrival she was tachypneic at 28 with O2 sat 98% on room air, pulse 88, BP 146/72.  Troponins were negative x2 at 6 and 6.  WBC slightly elevated at 14 but otherwise CBC and CMP unremarkable. EKG as reviewed by me : Sinus tachycardia at 105 with no acute ST-T wave changes CTA chest negative for PE  Review of Systems: As per HPI otherwise all other systems on review of systems negative.    Past Medical History:  Diagnosis Date  . Aortic insufficiency    a. TTE 12/19: EF 55-60%, probable HK of the mid apical anterior septal myocardium, Gr1DD, mild AI, mildly dilated LA  . Asthma   . CAD (coronary artery disease)    a. NSTEMI 12/19; b. LHC 10/08/18: LM minimal luminal irregs, mLAD-1 95% s/p PCI/DES, mLAD-2 60%, LCx mild diffuse disease throughout, RCA minimal luminal irregs;  b. 03/2020 MV: EF>65%, no ischemia/scar.  . Diabetes mellitus (HCC)   . Hypercholesterolemia   . Hypertension   . Myocardial infarction (HCC)   . Osteopenia   . Palpitations    a. 04/2020 Zio: Avg HR 75. 429 SVT episodes, longest 19 secs @ 133. Occas PACs (3.2%). Rare PVCs (<1%).  . Polymyalgia rheumatica syndrome (HCC)   . Reactive airway disease     Past Surgical History:  Procedure Laterality Date  . ABDOMINAL HYSTERECTOMY  1981   prolapse and bleeding, ovaries not removed  . BREAST EXCISIONAL BIOPSY Right   . CHOLECYSTECTOMY N/A 09/02/2019   Procedure: LAPAROSCOPIC CHOLECYSTECTOMY WITH INTRAOPERATIVE CHOLANGIOGRAM;  Surgeon: Henrene Dodge, MD;  Location: [facility] ORS;  Service: General;  Laterality: N/A;  . CORONARY STENT INTERVENTION N/A 10/08/2018   Procedure: CORONARY STENT INTERVENTION;  Surgeon: Iran Ouch, MD;  Location: [facility] INVASIVE CV LAB;  Service: Cardiovascular;  Laterality: N/A;  . ENDOSCOPIC RETROGRADE CHOLANGIOPANCREATOGRAPHY (ERCP) WITH PROPOFOL N/A 08/08/2019   Procedure: ENDOSCOPIC RETROGRADE CHOLANGIOPANCREATOGRAPHY (ERCP) WITH PROPOFOL;  Surgeon: Midge Minium, MD;  Location: [facility] ENDOSCOPY;  Service: Endoscopy;  Laterality: N/A;  . LEFT HEART CATH AND CORONARY ANGIOGRAPHY N/A 10/08/2018   Procedure: LEFT HEART CATH AND CORONARY ANGIOGRAPHY;  Surgeon: Iran Ouch, MD;  Location: [facility] INVASIVE CV LAB;  Service: Cardiovascular;  Laterality: N/A;  . LEFT HEART CATH AND CORONARY ANGIOGRAPHY N/A 08/10/2020   Procedure: LEFT HEART CATH AND CORONARY ANGIOGRAPHY possible percutaneous intervention;  Surgeon: Iran Ouch,  MD;  Location: [facility] INVASIVE CV LAB;  Service: Cardiovascular;  Laterality: N/A;  . UMBILICAL HERNIA REPAIR  7/94     reports that she has never smoked. She has never used smokeless tobacco. She reports that she does not drink alcohol and does not use drugs.  Allergies  Allergen Reactions  . Tramadol Itching and Nausea And Vomiting     Family History  Problem Relation Age of Onset  . Heart attack Father   . Arthritis Mother   . Heart disease Mother   . Throat cancer Sister   . Parkinson's disease Sister   . COPD Brother       Prior to Admission medications   Medication Sig Start Date End Date Taking? Authorizing Provider  ADVAIR DISKUS 250-50 MCG/DOSE AEPB INHALE ONE PUFF BY MOUTH EVERY 12 HOURS. RINSE MOUTH AFTER EACH USE Patient not taking: Reported on 08/07/2020 02/17/16   Dale Trappe, MD  albuterol (PROVENTIL) (2.5 MG/3ML) 0.083% nebulizer solution Take 3 mLs (2.5 mg total) by nebulization every 4 (four) hours as needed for wheezing or shortness of breath. 08/18/20   Dale Ridgefield, MD  alendronate (FOSAMAX) 70 MG tablet Take 70 mg by mouth once a week.  11/06/17   [provider]  aspirin 81 MG tablet Take 81 mg by mouth daily.    [provider]  atorvastatin (LIPITOR) 80 MG tablet Take 1 tablet (80 mg total) by mouth daily at 6 PM. 04/01/20   Alver Sorrow, NP  ferrous sulfate 325 (65 FE) MG tablet Take 325 mg by mouth daily with breakfast.    [provider]  fluticasone (FLONASE) 50 MCG/ACT nasal spray Use 2 spray(s) in each nostril once daily 08/03/20   Dale Ridgely, MD  isosorbide mononitrate (IMDUR) 30 MG 24 hr tablet Take 1 tablet (30 mg total) by mouth daily. 08/11/20   Arnetha Courser, MD  lisinopril (ZESTRIL) 20 MG tablet Take 1 tablet by mouth once daily 06/11/20   Dunn, Raymon Mutton, PA-C  metFORMIN (GLUCOPHAGE) 500 MG tablet TAKE 1 TABLET BY MOUTH TWICE DAILY WITH A MEAL 05/05/20   Dale Guys, MD  metoprolol tartrate (LOPRESSOR) 25 MG tablet Take 1 tablet (25 mg total) by mouth 2 (two) times daily. 06/08/20   Alver Sorrow, NP  Multiple Vitamins-Minerals (VISION FORMULA/LUTEIN PO) Take 1 tablet by mouth daily.     [provider]  Nebulizers (COMPRESSOR/NEBULIZER) MISC 1 Units by Does not apply route as directed. 04/07/20   Dionne Bucy, MD  nitroGLYCERIN  (NITROSTAT) 0.4 MG SL tablet Place 1 tablet (0.4 mg total) under the tongue every 5 (five) minutes as needed for chest pain. 10/09/18   Adrian Saran, MD  PROAIR HFA 108 (90 Base) MCG/ACT inhaler Inhale 2 puffs into the lungs every 4 (four) hours as needed for wheezing or shortness of breath. 06/05/20   Dale , MD  Vitamin D, Ergocalciferol, (DRISDOL) 50000 units CAPS capsule Take 50,000 Units by mouth once a week. 06/24/18   [provider]    Physical Exam: Vitals:   08/19/20 1830 08/19/20 1900 08/19/20 2025 08/19/20 2030  BP: (!) 153/85 (!) 156/78 (!) 155/79 (!) 144/70  Pulse: 96 91 93 97  Resp: (!) 24 (!) 27 16 12   Temp:      TempSrc:      SpO2: 93% 95% 98% 98%  Weight:      Height:         Vitals:   08/19/20 1830 08/19/20 1900 08/19/20 2025 08/19/20  2030  BP: (!) 153/85 (!) 156/78 (!) 155/79 (!) 144/70  Pulse: 96 91 93 97  Resp: (!) 24 (!) 27 16 12   Temp:      TempSrc:      SpO2: 93% 95% 98% 98%  Weight:      Height:          Constitutional: Alert and oriented x 3 . Not in any apparent distress HEENT:      Head: Normocephalic and atraumatic.         Eyes: PERLA, EOMI, Conjunctivae are normal. Sclera is non-icteric.       Mouth/Throat: Mucous membranes are moist.       Neck: Supple with no signs of meningismus. Cardiovascular: Regular rate and rhythm. No murmurs, gallops, or rubs. 2+ symmetrical distal pulses are present . No JVD. No LE edema Respiratory: Respiratory effort with scattered rhonchi Gastrointestinal: Soft, non tender, and non distended with positive bowel sounds. No rebound or guarding. Genitourinary: No CVA tenderness. Musculoskeletal: Nontender with normal range of motion in all extremities. No cyanosis, or erythema of extremities. Neurologic:  Face is symmetric. Moving all extremities. No gross focal neurologic deficits . Skin: Skin is warm, dry.  No rash or ulcers Psychiatric: Mood and affect are normal    Labs on Admission: I have  personally reviewed following labs and imaging studies  CBC: Recent Labs  Lab 08/19/20 0943  WBC 14.0*  NEUTROABS 8.3*  HGB 13.9  HCT 42.8  MCV 84.8  PLT 410*   Basic Metabolic Panel: Recent Labs  Lab 08/19/20 0943  NA 138  K 4.0  CL 101  CO2 24  GLUCOSE 162*  BUN 16  CREATININE 0.74  CALCIUM 9.7   GFR: Estimated Creatinine Clearance: 38.8 mL/min (by C-G formula based on SCr of 0.74 mg/dL). Liver Function Tests: Recent Labs  Lab 08/19/20 0943  AST 17  ALT 14  ALKPHOS 69  BILITOT 1.0  PROT 7.7  ALBUMIN 4.5   No results for input(s): LIPASE, AMYLASE in the last 168 hours. No results for input(s): AMMONIA in the last 168 hours. Coagulation Profile: No results for input(s): INR, PROTIME in the last 168 hours. Cardiac Enzymes: No results for input(s): CKTOTAL, CKMB, CKMBINDEX, TROPONINI in the last 168 hours. BNP (last 3 results) No results for input(s): PROBNP in the last 8760 hours. HbA1C: No results for input(s): HGBA1C in the last 72 hours. CBG: No results for input(s): GLUCAP in the last 168 hours. Lipid Profile: No results for input(s): CHOL, HDL, LDLCALC, TRIG, CHOLHDL, LDLDIRECT in the last 72 hours. Thyroid Function Tests: No results for input(s): TSH, T4TOTAL, FREET4, T3FREE, THYROIDAB in the last 72 hours. Anemia Panel: No results for input(s): VITAMINB12, FOLATE, FERRITIN, TIBC, IRON, RETICCTPCT in the last 72 hours. Urine analysis:    Component Value Date/Time   COLORURINE YELLOW (A) 08/07/2020 1212   APPEARANCEUR CLEAR (A) 08/07/2020 1212   APPEARANCEUR Clear 02/18/2013 2004   LABSPEC 1.011 08/07/2020 1212   LABSPEC 1.024 02/18/2013 2004   PHURINE 6.0 08/07/2020 1212   GLUCOSEU NEGATIVE 08/07/2020 1212   GLUCOSEU NEGATIVE 01/28/2015 0810   HGBUR NEGATIVE 08/07/2020 1212   BILIRUBINUR NEGATIVE 08/07/2020 1212   BILIRUBINUR neg 01/16/2015 1437   BILIRUBINUR Negative 02/18/2013 2004   KETONESUR NEGATIVE 08/07/2020 1212   PROTEINUR 100 (A)  08/07/2020 1212   UROBILINOGEN 0.2 01/28/2015 0810   NITRITE NEGATIVE 08/07/2020 1212   LEUKOCYTESUR NEGATIVE 08/07/2020 1212   LEUKOCYTESUR Trace 02/18/2013 2004    Radiological Exams  on Admission: DG Chest 2 View  Result Date: 08/19/2020 CLINICAL DATA:  Shortness of breath EXAM: CHEST - 2 VIEW COMPARISON:  August 07, 2020 FINDINGS: There is a small calcified granuloma in the left base. There is slight bibasilar atelectasis. Lungs elsewhere are clear. Heart size and pulmonary vascularity are normal. No adenopathy. There is aortic atherosclerosis. There is degenerative change in the thoracic spine. IMPRESSION: No edema or airspace opacity. Slight bibasilar atelectasis. Small calcified granuloma left base. Heart size within normal limits. Aortic Atherosclerosis (ICD10-I70.0). Electronically Signed   By: Bretta Bang III M.D.   On: 08/19/2020 10:15   CT Angio Chest PE W and/or Wo Contrast  Result Date: 08/19/2020 CLINICAL DATA:  Shortness of breath for several hours EXAM: CT ANGIOGRAPHY CHEST WITH CONTRAST TECHNIQUE: Multidetector CT imaging of the chest was performed using the standard protocol during bolus administration of intravenous contrast. Multiplanar CT image reconstructions and MIPs were obtained to evaluate the vascular anatomy. CONTRAST:  73mL OMNIPAQUE IOHEXOL 350 MG/ML SOLN COMPARISON:  Chest x-ray from earlier in the same day. FINDINGS: Cardiovascular: Thoracic aorta demonstrates atherosclerotic calcification. No aneurysmal dilatation or dissection is noted. Coronary calcifications are seen. No cardiac enlargement is noted. Pulmonary artery shows a normal branching pattern. No filling defect to suggest pulmonary embolism is noted. Mediastinum/Nodes: Thoracic inlet is within normal limits. Scattered calcified hilar and mediastinal lymph nodes are noted consistent with prior granulomatous disease. The esophagus is within normal limits. Lungs/Pleura: Scattered calcified granulomas are  noted consistent with prior granulomatous disease. No sizable effusion or pneumothorax is seen. Scattered opacities are noted in both lungs likely related to scarring. No solid nodular changes are seen. Upper Abdomen: Visualized upper abdomen is unremarkable. Musculoskeletal: Degenerative changes of the thoracic spine are noted. Review of the MIP images confirms the above findings. IMPRESSION: No evidence of pulmonary emboli. Changes of prior granulomatous disease. Scattered airspace opacity suggestive of scarring. No focal confluent infiltrate is seen. No parenchymal nodules are noted. Aortic Atherosclerosis (ICD10-I70.0). Electronically Signed   By: Alcide Clever M.D.   On: 08/19/2020 17:46     Assessment/Plan 86 year old female with history of diabetes, CAD, DES to LAD in December 2019, HTN, HLD, recently hospitalized from 10/8-10/11 with chest pain, undergoing cardiac cath , who presents with recurrent chest pain associated with associated mild wheezing  Recurrent chest pain  Coronary artery disease -Chest pain is mostly atypical -CTA chest ruled out acute PE -Patient is status post cardiac cath on 10/11 which showed stable 60% mid LAD stenosis and a 90% stenosis in the distal LAD close to apex not amenable to intervention with recommendation for maximization of medical therapy -Troponins negative x2 -Continue aspirin, atorvastatin, Imdur 30 mg, metoprolol, NTG as needed -Cardiology consult  COPD with acute exacerbation (HCC) -DuoNebs every 6 and as needed -IV steroids    Diabetes mellitus with cardiac complication (HCC) -Sliding scale insulin coverage    DVT prophylaxis: Lovenox  Code Status: DNR Family Communication:  none  Disposition Plan: Back to previous home environment Consults called: Cardiology Status:.  Observation    Andris Baumann MD [facility]     08/19/2020, 10:59 PM

## 2020-08-19 NOTE — Telephone Encounter (Signed)
Spoke with daughter. They are at ED. Advised that [facility] could send patient home on oxygen if needed. Daughter stated she would mention it and let us know if they need anything.

## 2020-08-19 NOTE — ED Provider Notes (Signed)
[facility] Emergency Department Provider Note    First MD Initiated Contact with Patient 08/19/20 1555     (approximate)  I have reviewed the triage vital signs and the nursing notes.   HISTORY  Chief Complaint Shortness of Breath    HPI [place] Grabski is a 86 y.o. female the below listed past medical history presents to the ER for evaluation of shortness of breath and chest pain woke her from sleep around 1 this morning.  Does have some discomfort with taking deep inspiration.  Denies any cough.  Does feel generalized weak.  No measured fevers.  Daughter says that when EMS came to check on her this morning they reported that she may need oxygen however there is no report as to what her oxygen saturation was at that time.  She was not brought to the hospital at that time.  Family went to go check on her later this morning given her complaints is brought for further evaluation.    Past Medical History:  Diagnosis Date   Aortic insufficiency    a. TTE 12/19: EF 55-60%, probable HK of the mid apical anterior septal myocardium, Gr1DD, mild AI, mildly dilated LA   Asthma    CAD (coronary artery disease)    a. NSTEMI 12/19; b. LHC 10/08/18: LM minimal luminal irregs, mLAD-1 95% s/p PCI/DES, mLAD-2 60%, LCx mild diffuse disease throughout, RCA minimal luminal irregs; b. 03/2020 MV: EF>65%, no ischemia/scar.   Diabetes mellitus (HCC)    Hypercholesterolemia    Hypertension    Myocardial infarction (HCC)    Osteopenia    Palpitations    a. 04/2020 Zio: Avg HR 75. 429 SVT episodes, longest 19 secs @ 133. Occas PACs (3.2%). Rare PVCs (<1%).   Polymyalgia rheumatica syndrome (HCC)    Reactive airway disease    Family History  Problem Relation Age of Onset   Heart attack Father    Arthritis Mother    Heart disease Mother    Throat cancer Sister    Parkinson's disease Sister    COPD Brother    Past Surgical History:  Procedure Laterality  Date   ABDOMINAL HYSTERECTOMY  1981   prolapse and bleeding, ovaries not removed   BREAST EXCISIONAL BIOPSY Right    CHOLECYSTECTOMY N/A 09/02/2019   Procedure: LAPAROSCOPIC CHOLECYSTECTOMY WITH INTRAOPERATIVE CHOLANGIOGRAM;  Surgeon: Henrene Dodge, MD;  Location: [facility] ORS;  Service: General;  Laterality: N/A;   CORONARY STENT INTERVENTION N/A 10/08/2018   Procedure: CORONARY STENT INTERVENTION;  Surgeon: Iran Ouch, MD;  Location: [facility] INVASIVE CV LAB;  Service: Cardiovascular;  Laterality: N/A;   ENDOSCOPIC RETROGRADE CHOLANGIOPANCREATOGRAPHY (ERCP) WITH PROPOFOL N/A 08/08/2019   Procedure: ENDOSCOPIC RETROGRADE CHOLANGIOPANCREATOGRAPHY (ERCP) WITH PROPOFOL;  Surgeon: Midge Minium, MD;  Location: [facility] ENDOSCOPY;  Service: Endoscopy;  Laterality: N/A;   LEFT HEART CATH AND CORONARY ANGIOGRAPHY N/A 10/08/2018   Procedure: LEFT HEART CATH AND CORONARY ANGIOGRAPHY;  Surgeon: Iran Ouch, MD;  Location: [facility] INVASIVE CV LAB;  Service: Cardiovascular;  Laterality: N/A;   LEFT HEART CATH AND CORONARY ANGIOGRAPHY N/A 08/10/2020   Procedure: LEFT HEART CATH AND CORONARY ANGIOGRAPHY possible percutaneous intervention;  Surgeon: Iran Ouch, MD;  Location: [facility] INVASIVE CV LAB;  Service: Cardiovascular;  Laterality: N/A;   UMBILICAL HERNIA REPAIR  7/94   Patient Active Problem List   Diagnosis Date Noted   COPD with acute exacerbation (HCC) 08/19/2020   History of non-ST elevation myocardial infarction (NSTEMI) 08/07/2020   Asthma 08/07/2020  Leukocytosis 08/07/2020   Left shoulder pain 07/12/2020   Iron deficiency 04/16/2020   Anemia 04/04/2020   SOB (shortness of breath) 03/25/2020   Cough 09/30/2019   Gallstone pancreatitis    Pre-op evaluation 08/30/2019   Cholecystitis 08/05/2019   Choledocholithiasis    Respiratory illness 06/18/2019   Coronary artery disease 12/29/2018   Chest pain 10/07/2018   Memory change 05/27/2018   Osteoporosis  02/05/2018   Weight loss 06/24/2017   Abdominal pain, left lower quadrant 10/05/2016   Long term current use of systemic steroids 05/16/2016   Elevated erythrocyte sedimentation rate 05/04/2016   Back pain 04/29/2016   Fatigue 05/24/2015   Health care maintenance 01/25/2015   UTI (urinary tract infection) 07/07/2014   Neuropathy 03/24/2014   Diverticulitis 02/24/2013   Reactive airway disease 09/15/2012   Hypertension 09/14/2012   Hypercholesterolemia 09/14/2012   Diabetes mellitus with cardiac complication (HCC) 09/14/2012      Prior to Admission medications   Medication Sig Start Date End Date Taking? Authorizing Provider  ADVAIR DISKUS 250-50 MCG/DOSE AEPB INHALE ONE PUFF BY MOUTH EVERY 12 HOURS. RINSE MOUTH AFTER EACH USE 02/17/16  Yes Dale [place], MD  albuterol (PROVENTIL) (2.5 MG/3ML) 0.083% nebulizer solution Take 3 mLs (2.5 mg total) by nebulization every 4 (four) hours as needed for wheezing or shortness of breath. 08/18/20  Yes Dale [place], MD  aspirin 81 MG tablet Take 81 mg by mouth daily.   Yes [provider]  atorvastatin (LIPITOR) 80 MG tablet Take 1 tablet (80 mg total) by mouth daily at 6 PM. 04/01/20  Yes Alver Sorrow, NP  ferrous sulfate 325 (65 FE) MG tablet Take 325 mg by mouth daily with breakfast.   Yes [provider]  fluticasone (FLONASE) 50 MCG/ACT nasal spray Use 2 spray(s) in each nostril once daily Patient taking differently: Place 2 sprays into both nostrils daily.  08/03/20  Yes Dale [place], MD  isosorbide mononitrate (IMDUR) 30 MG 24 hr tablet Take 1 tablet (30 mg total) by mouth daily. 08/11/20  Yes Arnetha Courser, MD  lisinopril (ZESTRIL) 20 MG tablet Take 1 tablet by mouth once daily 06/11/20  Yes Dunn, Ryan M, PA-C  metFORMIN (GLUCOPHAGE) 500 MG tablet TAKE 1 TABLET BY MOUTH TWICE DAILY WITH A MEAL Patient taking differently: Take 500 mg by mouth 2 (two) times daily with a meal.  05/05/20  Yes Dale [place], MD  PROAIR HFA 108 [phone] Base) MCG/ACT inhaler Inhale 2 puffs into the lungs every 4 (four) hours as needed for wheezing or shortness of breath. 06/05/20  Yes Dale , MD  alendronate (FOSAMAX) 70 MG tablet Take 70 mg by mouth once a week.  11/06/17   [provider]  metoprolol tartrate (LOPRESSOR) 25 MG tablet Take 1 tablet (25 mg total) by mouth 2 (two) times daily. 06/08/20   Alver Sorrow, NP  Multiple Vitamins-Minerals (VISION FORMULA/LUTEIN PO) Take 1 tablet by mouth daily.     [provider]  Nebulizers (COMPRESSOR/NEBULIZER) MISC 1 Units by Does not apply route as directed. 04/07/20   Dionne Bucy, MD  nitroGLYCERIN (NITROSTAT) 0.4 MG SL tablet Place 1 tablet (0.4 mg total) under the tongue every 5 (five) minutes as needed for chest pain. 10/09/18   Adrian Saran, MD  Vitamin D, Ergocalciferol, (DRISDOL) 50000 units CAPS capsule Take 50,000 Units by mouth once a week. 06/24/18   [provider]    Allergies Tramadol    Social History Social History   Tobacco Use  Smoking status: Never Smoker   Smokeless tobacco: Never Used  Vaping Use   Vaping Use: Never used  Substance Use Topics   Alcohol use: No    Alcohol/week: 0.0 standard drinks   Drug use: No    Review of Systems Patient denies headaches, rhinorrhea, blurry vision, numbness, shortness of breath, chest pain, edema, cough, abdominal pain, nausea, vomiting, diarrhea, dysuria, fevers, rashes or hallucinations unless otherwise stated above in HPI. ____________________________________________   PHYSICAL EXAM:  VITAL SIGNS: Vitals:   08/19/20 2025 08/19/20 2030  BP: (!) 155/79 (!) 144/70  Pulse: 93 97  Resp: 16 12  Temp:    SpO2: 98% 98%    Constitutional: Alert and oriented.  Eyes: Conjunctivae are normal.  Head: Atraumatic. Nose: No congestion/rhinnorhea. Mouth/Throat: Mucous membranes are moist.   Neck: No stridor. Painless ROM.  Cardiovascular: Normal  rate, regular rhythm. Grossly normal heart sounds.  Good peripheral circulation. Respiratory: Normal respiratory effort.  No retractions. Lungs with scatter posterior wheeze Gastrointestinal: Soft and nontender. No distention. No abdominal bruits. No CVA tenderness. Genitourinary:  Musculoskeletal: No lower extremity tenderness nor edema.  No joint effusions. Neurologic:  Normal speech and language. No gross focal neurologic deficits are appreciated. No facial droop Skin:  Skin is warm, dry and intact. No rash noted. Psychiatric: Mood and affect are normal. Speech and behavior are normal.  ____________________________________________   LABS (all labs ordered are listed, but only abnormal results are displayed)  Results for orders placed or performed during the hospital encounter of 08/19/20 (from the past 24 hour(s))  CBC with Differential     Status: Abnormal   Collection Time: 08/19/20  9:43 AM  Result Value Ref Range   WBC 14.0 (H) 4.0 - 10.5 K/uL   RBC 5.05 3.87 - 5.11 MIL/uL   Hemoglobin 13.9 12.0 - 15.0 g/dL   HCT 50.5 36 - 46 %   MCV 84.8 80.0 - 100.0 fL   MCH 27.5 26.0 - 34.0 pg   MCHC 32.5 30.0 - 36.0 g/dL   RDW 39.7 (H) 67.3 - 41.9 %   Platelets 410 (H) 150 - 400 K/uL   nRBC 0.0 0.0 - 0.2 %   Neutrophils Relative % 59 %   Neutro Abs 8.3 (H) 1.7 - 7.7 K/uL   Lymphocytes Relative 22 %   Lymphs Abs 3.1 0.7 - 4.0 K/uL   Monocytes Relative 6 %   Monocytes Absolute 0.8 0.1 - 1.0 K/uL   Eosinophils Relative 12 %   Eosinophils Absolute 1.6 (H) 0.0 - 0.5 K/uL   Basophils Relative 1 %   Basophils Absolute 0.1 0.0 - 0.1 K/uL   Immature Granulocytes 0 %   Abs Immature Granulocytes 0.04 0.00 - 0.07 K/uL  Comprehensive metabolic panel     Status: Abnormal   Collection Time: 08/19/20  9:43 AM  Result Value Ref Range   Sodium 138 135 - 145 mmol/L   Potassium 4.0 3.5 - 5.1 mmol/L   Chloride 101 98 - 111 mmol/L   CO2 24 22 - 32 mmol/L   Glucose, Bld 162 (H) 70 - 99 mg/dL   BUN  16 8 - 23 mg/dL   Creatinine, Ser 3.79 0.44 - 1.00 mg/dL   Calcium 9.7 8.9 - 02.4 mg/dL   Total Protein 7.7 6.5 - 8.1 g/dL   Albumin 4.5 3.5 - 5.0 g/dL   AST 17 15 - 41 U/L   ALT 14 0 - 44 U/L   Alkaline Phosphatase 69 38 - 126 U/L  Total Bilirubin 1.0 0.3 - 1.2 mg/dL   GFR, Estimated >50 >53 mL/min   Anion gap 13 5 - 15  Troponin I (High Sensitivity)     Status: None   Collection Time: 08/19/20  9:43 AM  Result Value Ref Range   Troponin I (High Sensitivity) 6 <18 ng/L  Troponin I (High Sensitivity)     Status: None   Collection Time: 08/19/20  5:19 PM  Result Value Ref Range   Troponin I (High Sensitivity) 6 <18 ng/L   ____________________________________________  EKG My review and personal interpretation at Time: 9:37   Indication: sob  Rate: 100  Rhythm: sinus Axis: normal Other: normal intervals, no stemi ____________________________________________  RADIOLOGY  I personally reviewed all radiographic images ordered to evaluate for the above acute complaints and reviewed radiology reports and findings.  These findings were personally discussed with the patient.  Please see medical record for radiology report.  ____________________________________________   PROCEDURES  Procedure(s) performed:  Procedures    Critical Care performed: no ____________________________________________   INITIAL IMPRESSION / ASSESSMENT AND PLAN / ED COURSE  Pertinent labs & imaging results that were available during my care of the patient were reviewed by me and considered in my medical decision making (see chart for details).   DDX: Asthma, copd, CHF, pna, ptx, malignancy, Pe, anemia   [place] Riddell is a 86 y.o. who presents to the ED with presentation as described above.  Patient is elderly and frail appearing no respiratory distress mildly tachycardic does describe some pleuritic discomfort.  Has some scattered wheeze.  She is on anticoagulation.  Does not seem consistent with  ACS.  She is on the cardiac monitor in sinus rhythm with a heart rate in the low 100s to the high 90s.  Mildly hypertensive.  Given presentation will order CT imaging of the chest to further evaluate.   CT imaging was reassuring no sign of PE or infectious process.  During her work-up here although her cardiac enzymes and telemetry monitoring remained sinus no sign of cardiac abnormality she did start having worsening shortness of breath and wheezing requiring nebulizers as well as steroids.  She never became hypoxic but with ambulation became significantly dyspneic.  After further observation after nebs had return of worsening wheezing more consistent with COPD exacerbation therefore case discussed with hospitalist for admission.  The patient was evaluated in Emergency Department today for the symptoms described in the history of present illness. He/she was evaluated in the context of the global COVID-19 pandemic, which necessitated consideration that the patient might be at risk for infection with the SARS-CoV-2 virus that causes COVID-19. Institutional protocols and algorithms that pertain to the evaluation of patients at risk for COVID-19 are in a state of rapid change based on information released by regulatory bodies including the CDC and federal and state organizations. These policies and algorithms were followed during the patient's care in the ED.  As part of my medical decision making, I reviewed the following data within the electronic MEDICAL RECORD NUMBER Nursing notes reviewed and incorporated, Labs reviewed, notes from prior ED visits and [place] Controlled Substance Database   ____________________________________________   FINAL CLINICAL IMPRESSION(S) / ED DIAGNOSES  Final diagnoses:  COPD exacerbation (HCC)      NEW MEDICATIONS STARTED DURING THIS VISIT:  New Prescriptions   No medications on file     Note:  This document was prepared using Dragon voice recognition software and may  include unintentional dictation errors.  Willy Eddy, MD 08/19/20 2320

## 2020-08-19 NOTE — ED Notes (Signed)
Pt ambulatory in hall with assistance. Pt's oxygen saturation maintained at 95% on room air. Pt denies any dyspnea while ambulating.

## 2020-08-19 NOTE — ED Triage Notes (Signed)
Pt presents to ED via [facility] with c/o SOB, per EMS pt called EMS at approx 0100 this AM and refused EMS transport, however now reports does not remember EMS coming to evaluate. EMS reports pt c/o continued SOB at this time.   Upon arrival to ED pt denies pain, c/o continues SOB at this time.   20g to L forearm 125solumedrol 1 duoneb 2.5 albuterol   89HR 100% 158/95 CBG 186

## 2020-08-19 NOTE — ED Notes (Signed)
Purewick applied to patient at this time by this RN. Pt resting in bed with daughter at bedside.

## 2020-08-19 NOTE — Telephone Encounter (Signed)
Pt's daughter Rinaldo Cloud states that pt had EMS out at her house but she refused to go to ED. They suggested primary dr put her on oxygen. BP 207/94 as of last night- oxygen was 94% when paramedics were there. 91-92% pulse 102 currently-Pt was in the background today on the phone and agreed to go to ED by the end of phone call. Daughter would still like a call on her cell from [place]-[phone]

## 2020-08-20 DIAGNOSIS — I471 Supraventricular tachycardia: Secondary | ICD-10-CM

## 2020-08-20 DIAGNOSIS — J441 Chronic obstructive pulmonary disease with (acute) exacerbation: Secondary | ICD-10-CM

## 2020-08-20 DIAGNOSIS — R0789 Other chest pain: Secondary | ICD-10-CM | POA: Diagnosis not present

## 2020-08-20 DIAGNOSIS — I491 Atrial premature depolarization: Secondary | ICD-10-CM | POA: Diagnosis not present

## 2020-08-20 DIAGNOSIS — E1159 Type 2 diabetes mellitus with other circulatory complications: Secondary | ICD-10-CM

## 2020-08-20 DIAGNOSIS — I25118 Atherosclerotic heart disease of native coronary artery with other forms of angina pectoris: Secondary | ICD-10-CM | POA: Diagnosis not present

## 2020-08-20 LAB — CBC
HCT: 37 % (ref 36.0–46.0)
Hemoglobin: 12.5 g/dL (ref 12.0–15.0)
MCH: 28.5 pg (ref 26.0–34.0)
MCHC: 33.8 g/dL (ref 30.0–36.0)
MCV: 84.3 fL (ref 80.0–100.0)
Platelets: 367 10*3/uL (ref 150–400)
RBC: 4.39 MIL/uL (ref 3.87–5.11)
RDW: 15.9 % — ABNORMAL HIGH (ref 11.5–15.5)
WBC: 15.9 10*3/uL — ABNORMAL HIGH (ref 4.0–10.5)
nRBC: 0 % (ref 0.0–0.2)

## 2020-08-20 LAB — CREATININE, SERUM
Creatinine, Ser: 0.65 mg/dL (ref 0.44–1.00)
GFR, Estimated: >60 mL/min (ref >60)

## 2020-08-20 LAB — GLUCOSE, CAPILLARY
Glucose-Capillary: 216 mg/dL — ABNORMAL HIGH (ref 70–99)
Glucose-Capillary: 305 mg/dL — ABNORMAL HIGH (ref 70–99)

## 2020-08-20 LAB — HIV ANTIBODY (ROUTINE TESTING W REFLEX): HIV Screen 4th Generation wRfx: NONREACTIVE

## 2020-08-20 MED ORDER — METOPROLOL TARTRATE 25 MG PO TABS
25.0000 mg | ORAL_TABLET | Freq: Every day | ORAL | 0 refills | Status: DC | PRN
Start: 2020-08-20 — End: 2020-12-21

## 2020-08-20 MED ORDER — METHYLPREDNISOLONE SODIUM SUCC 125 MG IJ SOLR
INTRAMUSCULAR | Status: AC
  Filled 2020-08-20: qty 2

## 2020-08-20 MED ORDER — METOPROLOL SUCCINATE ER 50 MG PO TB24
50.0000 mg | ORAL_TABLET | Freq: Every day | ORAL | 0 refills | Status: DC
Start: 2020-08-21 — End: 2021-01-11

## 2020-08-20 MED ORDER — PROAIR HFA 108 (90 BASE) MCG/ACT IN AERS: 2.0000 | INHALATION_SPRAY | RESPIRATORY_TRACT | 0 refills | Status: DC | PRN

## 2020-08-20 MED ORDER — METOPROLOL TARTRATE 25 MG PO TABS: 37.5000 mg | ORAL_TABLET | Freq: Two times a day (BID) | ORAL | Status: DC

## 2020-08-20 MED ORDER — PREDNISONE 10 MG PO TABS: ORAL_TABLET | ORAL | 0 refills | Status: DC

## 2020-08-20 MED ORDER — IPRATROPIUM-ALBUTEROL 0.5-2.5 (3) MG/3ML IN SOLN
RESPIRATORY_TRACT | Status: AC
  Filled 2020-08-20: qty 3

## 2020-08-20 MED ORDER — METOPROLOL TARTRATE 25 MG PO TABS: 25.0000 mg | ORAL_TABLET | Freq: Two times a day (BID) | ORAL | Status: DC

## 2020-08-20 MED ORDER — METOPROLOL SUCCINATE ER 50 MG PO TB24
50.0000 mg | ORAL_TABLET | Freq: Every day | ORAL | Status: DC
  Administered 2020-08-20: 50 mg via ORAL
  Filled 2020-08-20: qty 1

## 2020-08-20 NOTE — Evaluation (Signed)
Occupational Therapy Evaluation Patient Details Name: Tanya Cole MRN: [id number] DOB: 06-Jul-1933 Today's Date: 08/20/2020    History of Present Illness 86 y.o. female with medical history significant for diabetes, CAD, NSTEMI 12/19, DES to LAD in December 2019, HTN, HLD, recently hospitalized from 10/8-10/11 with chest pain, undergoing cardiac cath which showed stable 60% mid LAD stenosis and a 90% stenosis in the distal LAD close to apex not amenable to intervention with recommendation for maximization of medical therapy, who presents with recurrent chest pain associated with shortness of breath.  She states the chest pain woke her from sleep and it radiates from the right side of her chest to the middle and left side of her chest.  It is of moderate intensity with no aggravating or alleviating factors.  She denies nausea vomiting or diaphoresis, lightheadedness or palpitations.  Denies fever or chills.  Denies nausea vomiting or diarrhea.  Has some wheezing and a mild cough. CTA chest negative for PE.   Clinical Impression   Pt was seen for OT evaluation this date. Prior to hospital admission, pt was living alone with family nearby, independent with mobility, dressing, meal prep, and requiring assist for transportation, PRN reminders from family to take medications, and remote supervision for safety while pt performed sponge baths. Pt lives in a 1 story home with 3 steps and no rail. Currently pt demonstrates impairments as described below (See OT problem list) which functionally limit her ability to perform ADL/self-care tasks at baseline independence. Pt currently requires CGA for ADL transfers and mobility. Telemetry indicating Afib once pt engaged in exertional activity. Denied dizziness/SOB. Pt would benefit from skilled OT services to address noted impairments and functional limitations (see below for any additional details) in order to maximize safety and independence while minimizing  falls risk and caregiver burden. Do not currently anticipate need for skilled OT Services upon hospital discharge, pending improvement in cardiopulmonary status. Will continue to assess as pt progresses.    Follow Up Recommendations  No OT follow up;Supervision - Intermittent    Equipment Recommendations  None recommended by OT    Recommendations for Other Services       Precautions / Restrictions Precautions Precautions: Fall Precaution Comments: monitor HR Restrictions Weight Bearing Restrictions: No      Mobility Bed Mobility Overal bed mobility: Needs Assistance Bed Mobility: Supine to Sit;Sit to Supine     Supine to sit: Supervision;HOB elevated Sit to supine: Supervision        Transfers Overall transfer level: Needs assistance Equipment used: None Transfers: Sit to/from Stand Sit to Stand: Min guard;From elevated surface              Balance Overall balance assessment: Needs assistance Sitting-balance support: No upper extremity supported;Feet unsupported Sitting balance-Leahy Scale: Fair     Standing balance support: No upper extremity supported Standing balance-Leahy Scale: Fair                             ADL either performed or assessed with clinical judgement   ADL Overall ADL's : Needs assistance/impaired Eating/Feeding: Set up Eating/Feeding Details (indicate cue type and reason): pt has arthritis and sometimes has difficulty opening small packets; family assist as needed                 Lower Body Dressing: Supervision/safety;Sitting/lateral leans Lower Body Dressing Details (indicate cue type and reason): doffed, donned socks without assist/difficulty  Functional mobility during ADLs: Min guard       Vision Baseline Vision/History: Wears glasses Wears Glasses: At all times Patient Visual Report: No change from baseline       Perception     Praxis      Pertinent Vitals/Pain Pain Assessment:  No/denies pain     Hand Dominance Right   Extremity/Trunk Assessment Upper Extremity Assessment Upper Extremity Assessment: Overall WFL for tasks assessed;Generalized weakness   Lower Extremity Assessment Lower Extremity Assessment: Overall WFL for tasks assessed;Generalized weakness   Cervical / Trunk Assessment Cervical / Trunk Assessment: Normal   Communication Communication Communication: No difficulties   Cognition Arousal/Alertness: Awake/alert Behavior During Therapy: WFL for tasks assessed/performed Overall Cognitive Status: Within Functional Limits for tasks assessed                                     General Comments  HR up to 130's, RR up to 30's, SpO2 in low to mid 90's on room air with exertion; tele reading Afib intermittently during session; pt denied symptoms    Exercises Other Exercises Other Exercises: Pt negotiated ED room with CGA, no LOB, and denied SOB, dizziness   Shoulder Instructions      Home Living Family/patient expects to be discharged to:: Private residence Living Arrangements: Alone Available Help at Discharge: Family;Available PRN/intermittently (family checks on her frequently) Type of Home: House Home Access: Stairs to enter [employer] of Steps: 3 Entrance Stairs-Rails: None Home Layout: One level     Bathroom Shower/Tub: Tub/shower unit (doesn't use)   Bathroom Toilet: Standard     Home Equipment: Environmental consultant - 2 wheels;Cane - single point          Prior Functioning/Environment Level of Independence: Needs assistance  Gait / Transfers Assistance Needed: pt ambulates without AD, denies falls in past 6mo ADL's / Homemaking Assistance Needed: Pt indep with basic ADL, sponge baths, meal prep, enjoys reading and puzzles; dtr endorses pt sometimes needs "reminders" for taking her medication, family provide transportation            OT Problem List: Impaired balance (sitting and/or standing);Decreased  activity tolerance;Cardiopulmonary status limiting activity;Decreased knowledge of use of DME or AE      OT Treatment/Interventions: Self-care/ADL training;Therapeutic exercise;Therapeutic activities;DME and/or AE instruction;Patient/family education;Balance training    OT Goals(Current goals can be found in the care plan section) Acute Rehab OT Goals Patient Stated Goal: go home OT Goal Formulation: With patient/family Time For Goal Achievement: 09/03/20 Potential to Achieve Goals: Good ADL Goals Pt Will Perform Lower Body Dressing: with modified independence;sit to/from stand Pt Will Transfer to Toilet: with modified independence;ambulating;regular height toilet Additional ADL Goal #1: Pt will verbalize plan to implement at least 1 learned falls prevention strategy to maximize safety and minimize over exertion.  OT Frequency: Min 1X/week   Barriers to D/C:            Co-evaluation              AM-PAC OT "6 Clicks" Daily Activity     Outcome Measure Help from another person eating meals?: None Help from another person taking care of personal grooming?: None Help from another person toileting, which includes using toliet, bedpan, or urinal?: None Help from another person bathing (including washing, rinsing, drying)?: A Little Help from another person to put on and taking off regular upper body clothing?: None Help from another person  to put on and taking off regular lower body clothing?: None 6 Click Score: 23   End of Session Equipment Utilized During Treatment: Gait belt Nurse Communication: Mobility status (Afib)  Activity Tolerance: Patient tolerated treatment well Patient left: in bed;with call bell/phone within reach;with family/visitor present (set up with breakfast tray)  OT Visit Diagnosis: Other abnormalities of gait and mobility (R26.89)                Time: 7619-5093 OT Time Calculation (min): 18 min Charges:  OT General Charges $OT Visit: 1 Visit OT  Evaluation $OT Eval Moderate Complexity: 1 Mod OT Treatments $Therapeutic Activity: 8-22 mins  Richrd Prime, MPH, MS, OTR/L ascom [phone] 08/20/20, 10:47 AM

## 2020-08-20 NOTE — Evaluation (Signed)
Physical Therapy Evaluation Patient Details Name: Tanya Cole MRN: [id number] DOB: 1933-09-01 Today's Date: 08/20/2020   History of Present Illness  86 y.o. female with medical history significant for diabetes, CAD, NSTEMI 12/19, DES to LAD in December 2019, HTN, HLD, recently hospitalized from 10/8-10/11 with chest pain, undergoing cardiac cath which showed stable 60% mid LAD stenosis and a 90% stenosis in the distal LAD close to apex not amenable to intervention with recommendation for maximization of medical therapy, who presents with recurrent chest pain associated with shortness of breath.  She states the chest pain woke her from sleep and it radiates from the right side of her chest to the middle and left side of her chest.  It is of moderate intensity with no aggravating or alleviating factors.  She denies nausea vomiting or diaphoresis, lightheadedness or palpitations.  Denies fever or chills.  Denies nausea vomiting or diarrhea.  Has some wheezing and a mild cough. CTA chest negative for PE.  Clinical Impression  Pt did quite well with mobility and ambulation with no significant balance or safety issues (she did have one small stagger step that she easily self arrested).  Pt's biggest issue, however, is her cardiovascular status with HR jumping to 110s in sitting and though variable generally staying ~120 +/- during 75 ft of in-room ambulation.     Follow Up Recommendations Home health PT;Supervision - Intermittent    Equipment Recommendations  None recommended by PT    Recommendations for Other Services       Precautions / Restrictions Precautions Precautions: Fall Precaution Comments: afib/tachy Restrictions Weight Bearing Restrictions: No      Mobility  Bed Mobility Overal bed mobility: Modified Independent Bed Mobility: Supine to Sit;Sit to Supine     Supine to sit: Supervision;HOB elevated Sit to supine: Supervision        Transfers Overall transfer  level: Needs assistance Equipment used: None Transfers: Sit to/from Stand Sit to Stand: Supervision         General transfer comment: Pt able to rise from standing with good safety and confidence  Ambulation/Gait Ambulation/Gait assistance: Supervision Gait Distance (Feet): 75 Feet Assistive device: None       General Gait Details: multiple loops in the room w/o AD and no LOBs or other over safety concerns.  However she did have afib and elevated HR to most of the effort with 110-128 bpm most of the time.  Deferred further ambulation secondary to HR, only one minimal stagger stepping while turning to get back to bed  Stairs            Wheelchair Mobility    Modified Rankin (Stroke Patients Only)       Balance Overall balance assessment: Needs assistance Sitting-balance support: No upper extremity supported;Feet unsupported Sitting balance-Leahy Scale: Good Sitting balance - Comments: no issues   Standing balance support: No upper extremity supported Standing balance-Leahy Scale: Good Standing balance comment: Pt with good confidence during standing/functional tasks, no LOBs or need for UEs t/o the effort                             Pertinent Vitals/Pain Pain Assessment: No/denies pain    Home Living Family/patient expects to be discharged to:: Private residence Living Arrangements: Alone Available Help at Discharge: Family;Available PRN/intermittently Type of Home: House Home Access: Stairs to enter Entrance Stairs-Rails: None Entrance Stairs-Number of Steps: 3 Home Layout: One level Home Equipment: Environmental consultant -  2 wheels;Cane - single point      Prior Function Level of Independence: Needs assistance   Gait / Transfers Assistance Needed: pt ambulates without AD, denies falls in past 94mo  ADL's / Homemaking Assistance Needed: Pt indep with basic ADL, sponge baths, meal prep, enjoys reading and puzzles; dtr endorses pt sometimes needs "reminders"  for taking her medication, family provide transportation        Hand Dominance   Dominant Hand: Right    Extremity/Trunk Assessment   Upper Extremity Assessment Upper Extremity Assessment: Overall WFL for tasks assessed    Lower Extremity Assessment Lower Extremity Assessment: Overall WFL for tasks assessed    Cervical / Trunk Assessment Cervical / Trunk Assessment: Normal  Communication   Communication: No difficulties  Cognition Arousal/Alertness: Awake/alert Behavior During Therapy: WFL for tasks assessed/performed Overall Cognitive Status: Within Functional Limits for tasks assessed                                        General Comments General comments (skin integrity, edema, etc.): Pt with good overall mobility, confidence, etc but elevated HR with even modest activity    Exercises Other Exercises Other Exercises: Pt negotiated ED room with CGA, no LOB, and denied SOB, dizziness   Assessment/Plan    PT Assessment Patient needs continued PT services  PT Problem List Decreased activity tolerance;Cardiopulmonary status limiting activity;Decreased strength;Decreased balance       PT Treatment Interventions Gait training;Stair training;Functional mobility training;Therapeutic activities;Balance training;Therapeutic exercise    PT Goals (Current goals can be found in the Care Plan section)  Acute Rehab PT Goals Patient Stated Goal: go home PT Goal Formulation: With patient Time For Goal Achievement: 09/03/20 Potential to Achieve Goals: Good    Frequency Min 2X/week   Barriers to discharge        Co-evaluation               AM-PAC PT "6 Clicks" Mobility  Outcome Measure Help needed turning from your back to your side while in a flat bed without using bedrails?: None Help needed moving from lying on your back to sitting on the side of a flat bed without using bedrails?: None Help needed moving to and from a bed to a chair (including  a wheelchair)?: None Help needed standing up from a chair using your arms (e.g., wheelchair or bedside chair)?: None Help needed to walk in hospital room?: None Help needed climbing 3-5 steps with a railing? : None 6 Click Score: 24    End of Session Equipment Utilized During Treatment: Gait belt Activity Tolerance: Treatment limited secondary to medical complications (Comment) (afib/tachy) Patient left: in bed;with family/visitor present;with call bell/phone within reach Nurse Communication: Mobility status (HR ) PT Visit Diagnosis: Muscle weakness (generalized) (M62.81);Difficulty in walking, not elsewhere classified (R26.2);Unsteadiness on feet (R26.81)    Time: 1003-1030 PT Time Calculation (min) (ACUTE ONLY): 27 min   Charges:   PT Evaluation $PT Eval Low Complexity: 1 Low PT Treatments $Therapeutic Activity: 8-22 mins        Malachi Pro, DPT 08/20/2020, 1:03 PM

## 2020-08-20 NOTE — Progress Notes (Signed)
Inpatient Diabetes Program Recommendations  AACE/ADA: New Consensus Statement on Inpatient Glycemic Control (2015)  Target Ranges:  Prepandial:   less than 140 mg/dL      Peak postprandial:   less than 180 mg/dL (1-2 hours)      Critically ill patients:  140 - 180 mg/dL    Results for Tanya Cole, Tanya Cole (MRN [id number]) as of 08/20/2020 12:48  Ref. Range 08/20/2020 08:26 08/20/2020 11:49  Glucose-Capillary Latest Ref Range: 70 - 99 mg/dL 453 (H) 646 (H)   Results for CHELESA, WEINGARTNER (MRN [id number]) as of 08/20/2020 12:48  Ref. Range 08/07/2020 13:37  Hemoglobin A1C Latest Ref Range: 4.8 - 5.6 % 6.7 (H)    Admit with: CP  History: DM  Home DM Meds: Metformin 500 mg BID  Current Orders: Novolog Moderate Correction Scale/ SSI (0-15 units) TID AC    MD- Note patient getting Solumedrol 40 mg Q6 hours.  CBGs 200-300 today.  Please consider adding basal insulin to inpatient insulin regimen while patient remains on IV Steroids:  Lantus 8 units Daily (0.15 units/kg) to start    --Will follow patient during hospitalization--  Ambrose Finland RN, MSN, CDE Diabetes Coordinator Inpatient Glycemic Control Team Team Pager: [phone] (8a-5p)

## 2020-08-20 NOTE — Consult Note (Addendum)
Cardiology Consultation:   [name]; [id number]; 07/20/1933   Admit date: 08/19/2020 Date of Consult: 08/20/2020  Primary Care Provider: Dale [place], MD Primary Cardiologist: Kirke Corin Primary Electrophysiologist:  None   Patient Profile:   Tanya Cole is a 86 y.o. female with a hx of CAD with NSTEMI in 09/2018 status post PCI/DES to the LAD, with most recent [facility] 08/10/2020 with medical management advised as outlined below, diastolic dysfunction, paroxysmal SVT, iron deficiency anemia requiring iron infusions, aortic insufficiency, diabetes, COPD, HTN, and HLD who is being seen today for the evaluation of possible Afib at the request of Dr. Allena Katz.  History of Present Illness:   Tanya Cole She was admitted to the hospital in 09/2018 with an NSTEMI. Diagnostic cath at that time revealed a 95% mid LAD stenosis along with 60% distal LAD stenosis. She underwent successful PCI/DES to the mid LAD. Echo at that time showed an EF of 55 to 60%. In 12/2018 she was admitted with atypical chest pain and normal troponins. Lexiscan MPI at that time showed no significant ischemia. She was seen as an outpatient in 03/2020 with indigestion and increasing DOE. Lexiscan MPI was nonischemic and showed a normal LV systolic function. There was report of possible A. fib during stress testing and as a result she underwent outpatient cardiac monitoring which showed no evidence of A. fib. She did have multiple runs of paroxysmal SVT.   She was recently admitted to [facility] from 10/8 through 10/11 for unstable angina. High-sensitivity troponin peaked at 195. Echo showed an EF of 60 to 65%, no regional wall motion abnormalities, grade 2 diastolic dysfunction, normal RV systolic function and cavity size, mildly dilated left atrium, and mild mitral regurgitation. She underwent diagnostic LHC on 08/10/2020 which demonstrated significant one-vessel CAD with patent proximal LAD stent with  mild in-stent restenosis. There was stable mid LAD stenosis at 60% and significant distal LAD stenosis at 90% close to the apex. There was normal LV systolic function and a mildly elevated LVEDP. The distal LAD stenosis was close to the apex and not amenable to PCI. Continued medical therapy was recommended.   She returned to [facility] on 10/20 with onset of chest pain and dyspnea the night prior.  Symptoms did not feel similar to prior angina. High-sensitivity troponin negative x2. WBC 15.9. Hgb 12.5. Covid negative. EKG showed sinus tachycardia with PACs, 103 bpm, no acute ST-T changes. Chest x-ray showed slight bibasilar atelectasis with no edema or airspace opacity and a small calcified granuloma at the left base. CTA chest showed no evidence of PE. There was scattered airspace opacities suggestive of scarring. Cardiology is asked to evaluate for question of Afib. Currently without chest pain or dyspnea. No dizziness, presyncope, or syncope.   Review of telemetry shows sinus rhythm with heart rates ranging from the 80s to low 100s bpm, occasional PACs, and rare short runs of atrial tach lasting 6-8 beats.      Past Medical History:  Diagnosis Date  . Aortic insufficiency    a. TTE 12/19: EF 55-60%, probable HK of the mid apical anterior septal myocardium, Gr1DD, mild AI, mildly dilated LA  . Asthma   . CAD (coronary artery disease)    a. NSTEMI 12/19; b. LHC 10/08/18: LM minimal luminal irregs, mLAD-1 95% s/p PCI/DES, mLAD-2 60%, LCx mild diffuse disease throughout, RCA minimal luminal irregs; b. 03/2020 MV: EF>65%, no ischemia/scar.  . Diabetes mellitus (HCC)   . Hypercholesterolemia   . Hypertension   .  Myocardial infarction (HCC)   . Osteopenia   . Palpitations    a. 04/2020 Zio: Avg HR 75. 429 SVT episodes, longest 19 secs @ 133. Occas PACs (3.2%). Rare PVCs (<1%).  . Polymyalgia rheumatica syndrome (HCC)   . Reactive airway disease     Past Surgical History:  Procedure  Laterality Date  . ABDOMINAL HYSTERECTOMY  1981   prolapse and bleeding, ovaries not removed  . BREAST EXCISIONAL BIOPSY Right   . CHOLECYSTECTOMY N/A 09/02/2019   Procedure: LAPAROSCOPIC CHOLECYSTECTOMY WITH INTRAOPERATIVE CHOLANGIOGRAM;  Surgeon: Henrene Dodge, MD;  Location: [facility] ORS;  Service: General;  Laterality: N/A;  . CORONARY STENT INTERVENTION N/A 10/08/2018   Procedure: CORONARY STENT INTERVENTION;  Surgeon: Iran Ouch, MD;  Location: [facility] INVASIVE CV LAB;  Service: Cardiovascular;  Laterality: N/A;  . ENDOSCOPIC RETROGRADE CHOLANGIOPANCREATOGRAPHY (ERCP) WITH PROPOFOL N/A 08/08/2019   Procedure: ENDOSCOPIC RETROGRADE CHOLANGIOPANCREATOGRAPHY (ERCP) WITH PROPOFOL;  Surgeon: Midge Minium, MD;  Location: [facility] ENDOSCOPY;  Service: Endoscopy;  Laterality: N/A;  . LEFT HEART CATH AND CORONARY ANGIOGRAPHY N/A 10/08/2018   Procedure: LEFT HEART CATH AND CORONARY ANGIOGRAPHY;  Surgeon: Iran Ouch, MD;  Location: [facility] INVASIVE CV LAB;  Service: Cardiovascular;  Laterality: N/A;  . LEFT HEART CATH AND CORONARY ANGIOGRAPHY N/A 08/10/2020   Procedure: LEFT HEART CATH AND CORONARY ANGIOGRAPHY possible percutaneous intervention;  Surgeon: Iran Ouch, MD;  Location: [facility] INVASIVE CV LAB;  Service: Cardiovascular;  Laterality: N/A;  . UMBILICAL HERNIA REPAIR  7/94     Home Meds: Prior to Admission medications   Medication Sig Start Date End Date Taking? Authorizing Provider  ADVAIR DISKUS 250-50 MCG/DOSE AEPB INHALE ONE PUFF BY MOUTH EVERY 12 HOURS. RINSE MOUTH AFTER EACH USE Patient taking differently: Inhale 1 puff into the lungs 2 (two) times daily.  02/17/16  Yes Dale Mesa, MD  albuterol (PROVENTIL) (2.5 MG/3ML) 0.083% nebulizer solution Take 3 mLs (2.5 mg total) by nebulization every 4 (four) hours as needed for wheezing or shortness of breath. 08/18/20  Yes Dale Warsaw, MD  alendronate (FOSAMAX) 70 MG tablet Take 70 mg by mouth once a week.  11/06/17  Yes [provider]  aspirin 81 MG tablet Take 81 mg by mouth daily.   Yes [provider]  atorvastatin (LIPITOR) 80 MG tablet Take 1 tablet (80 mg total) by mouth daily at 6 PM. 04/01/20  Yes Alver Sorrow, NP  ferrous sulfate 325 (65 FE) MG tablet Take 325 mg by mouth daily with breakfast.   Yes [provider]  fluticasone (FLONASE) 50 MCG/ACT nasal spray Use 2 spray(s) in each nostril once daily Patient taking differently: Place 2 sprays into both nostrils daily.  08/03/20  Yes Dale Sandborn, MD  isosorbide mononitrate (IMDUR) 30 MG 24 hr tablet Take 1 tablet (30 mg total) by mouth daily. 08/11/20  Yes Arnetha Courser, MD  lisinopril (ZESTRIL) 20 MG tablet Take 1 tablet by mouth once daily 06/11/20  Yes Bernd Crom M, PA-C  metFORMIN (GLUCOPHAGE) 500 MG tablet TAKE 1 TABLET BY MOUTH TWICE DAILY WITH A MEAL Patient taking differently: Take 500 mg by mouth 2 (two) times daily with a meal.  05/05/20  Yes Dale Reyno, MD  metoprolol tartrate (LOPRESSOR) 25 MG tablet Take 1 tablet (25 mg total) by mouth 2 (two) times daily. 06/08/20  Yes Alver Sorrow, NP  Multiple Vitamins-Minerals (VISION FORMULA/LUTEIN PO) Take 1 tablet by mouth daily.    Yes [provider]  nitroGLYCERIN (NITROSTAT) 0.4 MG  SL tablet Place 1 tablet (0.4 mg total) under the tongue every 5 (five) minutes as needed for chest pain. 10/09/18  Yes Adrian Saran, MD  PROAIR HFA 108 (90 Base) MCG/ACT inhaler Inhale 2 puffs into the lungs every 4 (four) hours as needed for wheezing or shortness of breath. 06/05/20  Yes Dale [place], MD  Vitamin D, Ergocalciferol, (DRISDOL) 50000 units CAPS capsule Take 50,000 Units by mouth once a week. 06/24/18  Yes [provider]  Nebulizers (COMPRESSOR/NEBULIZER) MISC 1 Units by Does not apply route as directed. 04/07/20   Dionne Bucy, MD    Inpatient Medications: Scheduled Meds: . enoxaparin (LOVENOX) injection  40 mg Subcutaneous Q24H  . insulin aspart   0-15 Units Subcutaneous TID WC  . ipratropium-albuterol  3 mL Nebulization Q6H  . methylPREDNISolone (SOLU-MEDROL) injection  40 mg Intravenous Q6H   Followed by  . [START ON 08/21/2020] predniSONE  40 mg Oral Q breakfast   Continuous Infusions:  PRN Meds: albuterol  Allergies:   Allergies  Allergen Reactions  . Tramadol Itching and Nausea And Vomiting    Social History:   Social History   Socioeconomic History  . Marital status: Widowed    Spouse name: Not on file  . Number of children: 3  . Years of education: Not on file  . Highest education level: Not on file  Occupational History  . Not on file  Tobacco Use  . Smoking status: Never Smoker  . Smokeless tobacco: Never Used  Vaping Use  . Vaping Use: Never used  Substance and Sexual Activity  . Alcohol use: No    Alcohol/week: 0.0 standard drinks  . Drug use: No  . Sexual activity: Not Currently  Other Topics Concern  . Not on file  Social History Narrative   No smoking; no alcohol; in [place]; worked in Designer, fashion/clothing. Lives by self in [place]. Does all of her own housework. Dtr does food shopping for her.   Social Determinants of Health   Financial Resource Strain:   . Difficulty of Paying Living Expenses: Not on file  Food Insecurity:   . Worried About Programme researcher, broadcasting/film/video in the Last Year: Not on file  . Ran Out of Food in the Last Year: Not on file  Transportation Needs:   . Lack of Transportation (Medical): Not on file  . Lack of Transportation (Non-Medical): Not on file  Physical Activity:   . Days of Exercise per Week: Not on file  . Minutes of Exercise per Session: Not on file  Stress:   . Feeling of Stress : Not on file  Social Connections: Moderately Integrated  . Frequency of Communication with Friends and Family: More than three times a week  . Frequency of Social Gatherings with Friends and Family: More than three times a week  . Attends Religious Services: More than 4 times per year  .  Active Member of Clubs or Organizations: Yes  . Attends Banker Meetings: Not on file  . Marital Status: Widowed  Intimate Partner Violence: Not At Risk  . Fear of Current or Ex-Partner: No  . Emotionally Abused: No  . Physically Abused: No  . Sexually Abused: No     Family History:   Family History  Problem Relation Age of Onset  . Heart attack Father   . Arthritis Mother   . Heart disease Mother   . Throat cancer Sister   . Parkinson's disease Sister   . COPD Brother  ROS:  Review of Systems  Constitutional: Positive for malaise/fatigue. Negative for chills, diaphoresis, fever and weight loss.  HENT: Negative for congestion.   Eyes: Negative for discharge and redness.  Respiratory: Positive for shortness of breath. Negative for cough, sputum production and wheezing.   Cardiovascular: Positive for chest pain. Negative for palpitations, orthopnea, claudication, leg swelling and PND.  Gastrointestinal: Negative for abdominal pain, blood in stool, heartburn, melena, nausea and vomiting.  Musculoskeletal: Negative for falls and myalgias.  Skin: Negative for rash.  Neurological: Positive for weakness. Negative for dizziness, tingling, tremors, sensory change, speech change, focal weakness and loss of consciousness.  Endo/Heme/Allergies: Does not bruise/bleed easily.  Psychiatric/Behavioral: Negative for substance abuse. The patient is not nervous/anxious.   All other systems reviewed and are negative.     Physical Exam/Data:   Vitals:   08/20/20 0400 08/20/20 0500 08/20/20 0600 08/20/20 0700  BP: 109/61 (!) 145/84 134/76 134/68  Pulse: 95 74 79 75  Resp: (!) 21 (!) 23 (!) 24 (!) 25  Temp:      TempSrc:      SpO2: 96% 96% 95% 96%  Weight:      Height:       No intake or output data in the 24 hours ending 08/20/20 1555 Filed Weights   08/19/20 0939  Weight: 52.2 kg   Body mass index is 21.73 kg/m.   Physical Exam: General: Well developed, well  nourished, in no acute distress. Head: Normocephalic, atraumatic, sclera non-icteric, no xanthomas, nares without discharge.  Neck: Negative for carotid bruits. JVD not elevated. Lungs: Coarse breath sounds bilaterally. Breathing is unlabored. Heart: RRR with S1 S2. I/VI systolic murmur LLSB, no rubs, or gallops appreciated. Abdomen: Soft, non-tender, non-distended with normoactive bowel sounds. No hepatomegaly. No rebound/guarding. No obvious abdominal masses. Msk:  Strength and tone appear normal for age. Extremities: No clubbing or cyanosis. No edema. Distal pedal pulses are 2+ and equal bilaterally. Neuro: Alert and oriented X 3. No facial asymmetry. No focal deficit. Moves all extremities spontaneously. Psych:  Responds to questions appropriately with a normal affect.   EKG:  The EKG was personally reviewed and demonstrates: sinus tachycardia with PACs, 103 bpm, no acute ST-T changes Telemetry:  Telemetry was personally reviewed and demonstrates: SR with sinus tachycardia with heart rates in the 80s to low 100s bpm, occasional PACs, short runs of atrial tachycardia lasting 6-8 beats. No evidence of Afib. Reviewed with MD.   Weights: Filed Weights   08/19/20 0939  Weight: 52.2 kg    Relevant CV Studies:  LHC 08/10/2020:   Mid LAD lesion is 60% stenosed.  Prox LAD lesion is 30% stenosed.  Dist LAD lesion is 90% stenosed.  Prox RCA lesion is 30% stenosed.  The left ventricular systolic function is normal.  LV end diastolic pressure is mildly elevated.  The left ventricular ejection fraction is 55-65% by visual estimate.   1.  Significant one-vessel coronary artery disease with patent proximal LAD stent with mild in-stent restenosis.  Stable mid LAD stenosis at 60% and significant distal LAD stenosis 90% close to the apex. 2.  Normal LV systolic function mildly elevated left ventricular end-diastolic pressure.  Recommendations: Continue medical therapy.  Distal LAD  stenosis is close to the apex and not amenable to PCI.  __________  2D echo 08/08/2020: 1. Left ventricular ejection fraction, by estimation, is 60 to 65%. The  left ventricle has normal function. The left ventricle has no regional  wall motion abnormalities. Left ventricular diastolic  parameters are  consistent with Grade II diastolic  dysfunction (pseudonormalization).  2. Right ventricular systolic function is normal. The right ventricular  size is normal. Tricuspid regurgitation signal is inadequate for assessing  PA pressure.  3. Left atrial size was mildly dilated.  4. Mild mitral valve regurgitation.  __________  Luci Bank 03/2020: Normal sinus rhythm with an average heart rate of 75 bpm. 429 episodes of supraventricular tachycardia the longest lasted 19 seconds with a rate of 133 bpm. Occasional PACs with a burden of 3.2%. Rare PVCs with a burden of less than 1%.    Laboratory Data:  Chemistry Recent Labs  Lab 08/19/20 0456 08/19/20 0943  NA  --  138  K  --  4.0  CL  --  101  CO2  --  24  GLUCOSE  --  162*  BUN  --  16  CREATININE 0.65 0.74  CALCIUM  --  9.7  GFRNONAA >60 >60  ANIONGAP  --  13    Recent Labs  Lab 08/19/20 0943  PROT 7.7  ALBUMIN 4.5  AST 17  ALT 14  ALKPHOS 69  BILITOT 1.0   Hematology Recent Labs  Lab 08/19/20 0456 08/19/20 0943  WBC 15.9* 14.0*  RBC 4.39 5.05  HGB 12.5 13.9  HCT 37.0 42.8  MCV 84.3 84.8  MCH 28.5 27.5  MCHC 33.8 32.5  RDW 15.9* 15.9*  PLT 367 410*   Cardiac EnzymesNo results for input(s): TROPONINI in the last 168 hours. No results for input(s): TROPIPOC in the last 168 hours.  BNPNo results for input(s): BNP, PROBNP in the last 168 hours.  DDimer No results for input(s): DDIMER in the last 168 hours.  Radiology/Studies:  DG Chest 2 View  Result Date: 08/19/2020 IMPRESSION: No edema or airspace opacity. Slight bibasilar atelectasis. Small calcified granuloma left base. Heart size within normal limits.  Aortic Atherosclerosis (ICD10-I70.0). Electronically Signed   By: Bretta Bang III M.D.   On: 08/19/2020 10:15   CT Angio Chest PE W and/or Wo Contrast  Result Date: 08/19/2020 IMPRESSION: No evidence of pulmonary emboli. Changes of prior granulomatous disease. Scattered airspace opacity suggestive of scarring. No focal confluent infiltrate is seen. No parenchymal nodules are noted. Aortic Atherosclerosis (ICD10-I70.0). Electronically Signed   By: Alcide Clever M.D.   On: 08/19/2020 17:46    Assessment and Plan:   1. CAD involving the native coronary arteries with other forms of chest pain:  -High-sensitivity troponin negative x2  -EKG nonacute  -Recent diagnostic catheter earlier this month showed patent LAD stent with stable 60% mid LAD stenosis and 90% distal LAD stenosis at the apex not amenable to PCI  -No indication for heparin drip for further inpatient ischemic testing  -Continue current medical therapy including aspirin, atorvastatin, Imdur, lisinopril, and newly started Toprol as below  -PRN SL NTG  2.PACs/atrial tachycardia with history of pSVT:  -No evidence of Afib on telemetry  -No indication for OAC without objective evidence of Afib/flutter  -She has not received PTA metoprolol since arriving to the ED -She has not always taken her Lopressor twice daily as directed  -Change Lopressor to Toprol XL 50 mg daily after discussion with MD -Can take PRN Lopressor for tachy-palpitations   3. COPD:  -Appears stable -Management per internal medicine   4. Diastolic dysfunction:  -She appears euvolemic -Not requiring standing diuretic   5. Iron deficiency anemia:  -Hgb stable   6. HTN:  -BP improving  -Continue current therapy   7.  HLD:  -LDL 37 from earlier this month  -Continue PTA statin    For questions or updates, please contact [facility] Please consult [email or web address] for contact info under Cardiology/STEMI.   Signed, Eula Listen, PA-C [facility] Pager: [phone] 08/20/2020, 3:55 PM

## 2020-08-20 NOTE — Progress Notes (Signed)
Pt went into a-fib RVR after working with OT this AM with heart rate in the 120s, pt asymptomatic of this. MD notified, no new orders placed. Pt currently with a HR of 92 still in a-fib

## 2020-08-21 ENCOUNTER — Telehealth: Payer: Self-pay

## 2020-08-21 NOTE — Telephone Encounter (Signed)
Transition Care Management Follow-up Telephone Call  Date of discharge and from where: 08/20/20 from [facility]  How have you been since you were released from the hospital? Patient states," I feel kind of tired but back to baseline." Good appetite. Breathing treatment completed today and feels much better. Denies SOB, chest pain, cough, fever, dizziness, N/V/D and all other symptoms.   Any questions or concerns? No  Items Reviewed:  Did the pt receive and understand the discharge instructions provided? Yes   Medications obtained and verified? Yes . None new.   Other? No   Any new allergies since your discharge? No   Dietary orders reviewed? Yes  Do you have support at home? Yes , family lives next door and checks on her regularly.    Home Care and Equipment/Supplies: Were home health services ordered? No If so, what is the name of the agency? N/A Has the agency set up a time to come to the patient's home? N/A Were any new equipment or medical supplies ordered?  No What is the name of the medical supply agency? N/A Were you able to get the supplies/equipment? N/A Do you have any questions related to the use of the equipment or supplies? No  Functional Questionnaire: (I = Independent and D = Dependent) ADLs: i  Bathing/Dressing- i  Meal Prep- i  Eating- i  Maintaining continence- i  Transferring/Ambulation- i  Managing Meds- i  Follow up appointments reviewed:   PCP Hospital f/u appt confirmed? Patient awaiting call back for scheduling hfu. No availability until Nov 16.   Specialist Hospital f/u appt confirmed? None required.   Are transportation arrangements needed? No   If their condition worsens, is the pt aware to call PCP or go to the Emergency Dept.? Yes  Was the patient provided with contact information for the PCP's office or ED? Yes  Was the pt encouraged to call back with questions or concerns? Yes

## 2020-08-22 NOTE — Discharge Summary (Signed)
[facility] Discharge Summary   Patient: Tanya Cole ZOX:[id number]  PCP: Dale [place], MD  Date of admission: 08/19/2020   Date of discharge: 08/20/2020      Discharge Diagnoses:  Principal Problem:   Chest pain Active Problems:   Diabetes mellitus with cardiac complication [facility])   Coronary artery disease   History of non-ST elevation myocardial infarction (NSTEMI)   COPD with acute exacerbation (HCC)   Admitted From: home Disposition:  Home   Recommendations for Outpatient Follow-up:  1. PCP: Follow-up with PCP in 1 week. 2. Follow up LABS/TEST: None   Follow-up Information    Dale Lima, MD. Schedule an appointment as soon as possible for a visit in 1 week(s).   Specialty: Internal Medicine Contact information: [address] [place] [address] [phone]              Diet recommendation: Cardiac diet  Activity: The patient is advised to gradually reintroduce usual activities, as tolerated  Discharge Condition: stable  Code Status: DNR  History of present illness: As per the H and P dictated on admission, "Tanya Cole is a 86 y.o. female with medical history significant for diabetes, CAD, DES to LAD in December 2019, HTN, HLD, recently hospitalized from 10/8-10/11 with chest pain, undergoing cardiac cath which showed stable 60% mid LAD stenosis and a 90% stenosis in the distal LAD close to apex not amenable to intervention with recommendation for maximization of medical therapy, who presents with recurrent chest pain associated with shortness of breath.  She states the chest pain woke her from sleep and it radiates from the right side of her chest to the middle and left side of her chest.  It is of moderate intensity with no aggravating or alleviating factors.  She denies nausea vomiting or diaphoresis, lightheadedness or palpitations.  Denies fever or chills.  Denies nausea vomiting or diarrhea.  Has some wheezing  and a mild cough. ED Course: On arrival she was tachypneic at 28 with O2 sat 98% on room air, pulse 88, BP 146/72.  Troponins were negative x2 at 6 and 6.  WBC slightly elevated at 14 but otherwise CBC and CMP unremarkable. EKG as reviewed by me : Sinus tachycardia at 105 with no acute ST-T wave changes CTA chest negative for PE"  Hospital Course:  Summary of her active problems in the hospital is as following.  Recurrent chest pain Coronary artery disease Chest pain is mostly atypical CTA chest ruled out acute PE Patient is status post cardiac cath on 10/11 which showed stable 60% mid LAD stenosis and a 90% stenosis in the distal LAD close to apex not amenable to intervention with recommendation for maximization of medical therapy Troponins negative x2 Continue aspirin, atorvastatin, Imdur 30 mg, metoprolol, NTG as needed Cardiology consult No further work-up for now.  COPD with acute exacerbation (HCC) DuoNebs every 6 and as needed IV steroids Transition to oral steroids for now.  Diabetes mellitus with cardiac complication (HCC) Continue home regimen.  Atrial tachycardia No evidence of atrial fibrillation Very short runs of atrial tachycardia/SVT, in general has been asymptomatic metoprolol tartrate to metoprolol succinate 50 mg in the morning -She can take the metoprolol tartrate as needed for any breakthrough tachycardia  Coronary disease with chronic stable angina -Recommend she take her nitroglycerin sublingual for any chest pain with repeat up to 3 Cardiology discussed reasons why further intervention would not be prudent  Patient was seen by physical therapy, who recommended Home  health,  was arranged. On the day of the discharge the patient's vitals were stable, and no other acute medical condition were reported by patient. The patient was felt safe to be discharge at Home with Home health.  Consultants: Cardiology  Procedures: none  Discharge Exam: General:  Appear in no distress, no Rash; Oral Mucosa Clear, moist. no Abnormal Neck Mass Or lumps, Conjunctiva normal  Cardiovascular: S1 and S2 Present, no Murmur Respiratory: good respiratory effort, Bilateral Air entry present and CTA, no Crackles, no wheezes Abdomen: Bowel Sound present, Soft and no tenderness Extremities: no Pedal edema Neurology: alert and oriented to time, place, and person affect appropriate. no new focal deficit  Filed Weights   08/19/20 0939  Weight: 52.2 kg   Vitals:   08/20/20 0700 08/20/20 1623  BP: 134/68 (!) 138/56  Pulse: 75 85  Resp: (!) 25 18  Temp:    SpO2: 96% 93%    DISCHARGE MEDICATION: Allergies as of 08/20/2020      Reactions   Tramadol Itching, Nausea And Vomiting      Medication List    TAKE these medications   Advair Diskus 250-50 MCG/DOSE Aepb Generic drug: Fluticasone-Salmeterol INHALE ONE PUFF BY MOUTH EVERY 12 HOURS. RINSE MOUTH AFTER EACH USE What changed: See the new instructions.   albuterol (2.5 MG/3ML) 0.083% nebulizer solution Commonly known as: PROVENTIL Take 3 mLs (2.5 mg total) by nebulization every 4 (four) hours as needed for wheezing or shortness of breath.   ProAir HFA 108 (90 Base) MCG/ACT inhaler Generic drug: albuterol Inhale 2 puffs into the lungs every 4 (four) hours as needed for wheezing or shortness of breath.   alendronate 70 MG tablet Commonly known as: FOSAMAX Take 70 mg by mouth once a week.   aspirin 81 MG tablet Take 81 mg by mouth daily.   atorvastatin 80 MG tablet Commonly known as: LIPITOR Take 1 tablet (80 mg total) by mouth daily at 6 PM.   Compressor/Nebulizer Misc 1 Units by Does not apply route as directed.   ferrous sulfate 325 (65 FE) MG tablet Take 325 mg by mouth daily with breakfast.   fluticasone 50 MCG/ACT nasal spray Commonly known as: FLONASE Use 2 spray(s) in each nostril once daily What changed: See the new instructions.   isosorbide mononitrate 30 MG 24 hr  tablet Commonly known as: IMDUR Take 1 tablet (30 mg total) by mouth daily.   lisinopril 20 MG tablet Commonly known as: ZESTRIL Take 1 tablet by mouth once daily   metFORMIN 500 MG tablet Commonly known as: GLUCOPHAGE TAKE 1 TABLET BY MOUTH TWICE DAILY WITH A MEAL What changed: See the new instructions.   metoprolol succinate 50 MG 24 hr tablet Commonly known as: TOPROL-XL Take 1 tablet (50 mg total) by mouth daily. Take with or immediately following a meal.   metoprolol tartrate 25 MG tablet Commonly known as: LOPRESSOR Take 1 tablet (25 mg total) by mouth daily as needed (if Heart rate is >120). What changed:   when to take this  reasons to take this   nitroGLYCERIN 0.4 MG SL tablet Commonly known as: NITROSTAT Place 1 tablet (0.4 mg total) under the tongue every 5 (five) minutes as needed for chest pain.   predniSONE 10 MG tablet Commonly known as: DELTASONE Take 30mg  daily for 3days,Take 20mg  daily for 3days,Take 10mg  daily for 3days, then stop   VISION FORMULA/LUTEIN PO Take 1 tablet by mouth daily.   Vitamin D (Ergocalciferol) 1.25 MG (50000 UNIT)  Caps capsule Commonly known as: DRISDOL Take 50,000 Units by mouth once a week.      Allergies  Allergen Reactions  . Tramadol Itching and Nausea And Vomiting   Discharge Instructions    Diet - low sodium heart healthy   Complete by: As directed    Increase activity slowly   Complete by: As directed       The results of significant diagnostics from this hospitalization (including imaging, microbiology, ancillary and laboratory) are listed below for reference.    Significant Diagnostic Studies: DG Chest 2 View  Result Date: 08/19/2020 CLINICAL DATA:  Shortness of breath EXAM: CHEST - 2 VIEW COMPARISON:  August 07, 2020 FINDINGS: There is a small calcified granuloma in the left base. There is slight bibasilar atelectasis. Lungs elsewhere are clear. Heart size and pulmonary vascularity are normal. No  adenopathy. There is aortic atherosclerosis. There is degenerative change in the thoracic spine. IMPRESSION: No edema or airspace opacity. Slight bibasilar atelectasis. Small calcified granuloma left base. Heart size within normal limits. Aortic Atherosclerosis (ICD10-I70.0). Electronically Signed   By: Bretta Bang III M.D.   On: 08/19/2020 10:15   CT Angio Chest PE W and/or Wo Contrast  Result Date: 08/19/2020 CLINICAL DATA:  Shortness of breath for several hours EXAM: CT ANGIOGRAPHY CHEST WITH CONTRAST TECHNIQUE: Multidetector CT imaging of the chest was performed using the standard protocol during bolus administration of intravenous contrast. Multiplanar CT image reconstructions and MIPs were obtained to evaluate the vascular anatomy. CONTRAST:  40mL OMNIPAQUE IOHEXOL 350 MG/ML SOLN COMPARISON:  Chest x-ray from earlier in the same day. FINDINGS: Cardiovascular: Thoracic aorta demonstrates atherosclerotic calcification. No aneurysmal dilatation or dissection is noted. Coronary calcifications are seen. No cardiac enlargement is noted. Pulmonary artery shows a normal branching pattern. No filling defect to suggest pulmonary embolism is noted. Mediastinum/Nodes: Thoracic inlet is within normal limits. Scattered calcified hilar and mediastinal lymph nodes are noted consistent with prior granulomatous disease. The esophagus is within normal limits. Lungs/Pleura: Scattered calcified granulomas are noted consistent with prior granulomatous disease. No sizable effusion or pneumothorax is seen. Scattered opacities are noted in both lungs likely related to scarring. No solid nodular changes are seen. Upper Abdomen: Visualized upper abdomen is unremarkable. Musculoskeletal: Degenerative changes of the thoracic spine are noted. Review of the MIP images confirms the above findings. IMPRESSION: No evidence of pulmonary emboli. Changes of prior granulomatous disease. Scattered airspace opacity suggestive of scarring.  No focal confluent infiltrate is seen. No parenchymal nodules are noted. Aortic Atherosclerosis (ICD10-I70.0). Electronically Signed   By: Alcide Clever M.D.   On: 08/19/2020 17:46   CT Abdomen Pelvis W Contrast  Result Date: 07/27/2020 CLINICAL DATA:  Unintended weight loss, persistent iron deficiency EXAM: CT ABDOMEN AND PELVIS WITH CONTRAST TECHNIQUE: Multidetector CT imaging of the abdomen and pelvis was performed using the standard protocol following bolus administration of intravenous contrast. CONTRAST:  34mL OMNIPAQUE IOHEXOL 300 MG/ML SOLN, additional oral enteric contrast COMPARISON:  10/06/2016 FINDINGS: Lower chest: No acute abnormality. Hepatobiliary: No focal liver abnormality is seen. Status post cholecystectomy. No biliary dilatation. Pancreas: Unremarkable. No pancreatic ductal dilatation or surrounding inflammatory changes. Spleen: Normal in size without significant abnormality. Adrenals/Urinary Tract: Adrenal glands are unremarkable. Kidneys are normal, without renal calculi, solid lesion, or hydronephrosis. Bladder is unremarkable. Stomach/Bowel: Stomach is within normal limits. Appendix appears normal. No evidence of bowel wall thickening, distention, or inflammatory changes. Severe pancolonic diverticulosis, worst in the sigmoid. Vascular/Lymphatic: Aortic atherosclerosis. No enlarged abdominal or pelvic lymph  nodes. Reproductive: Status post cholecystectomy. Other: No abdominal wall hernia or abnormality. No abdominopelvic ascites. Musculoskeletal: No acute or significant osseous findings. IMPRESSION: 1. No CT findings of the abdomen or pelvis to explain weight loss. 2. Severe pancolonic diverticulosis, worst in the sigmoid. No evidence of acute diverticulitis. 3. Status post cholecystectomy and hysterectomy. 4. Aortic Atherosclerosis (ICD10-I70.0). Electronically Signed   By: Lauralyn Primes M.D.   On: 07/27/2020 14:00   CARDIAC CATHETERIZATION  Result Date: 08/10/2020  Mid LAD lesion  is 60% stenosed.  Prox LAD lesion is 30% stenosed.  Dist LAD lesion is 90% stenosed.  Prox RCA lesion is 30% stenosed.  The left ventricular systolic function is normal.  LV end diastolic pressure is mildly elevated.  The left ventricular ejection fraction is 55-65% by visual estimate.  1.  Significant one-vessel coronary artery disease with patent proximal LAD stent with mild in-stent restenosis.  Stable mid LAD stenosis at 60% and significant distal LAD stenosis 90% close to the apex. 2.  Normal LV systolic function mildly elevated left ventricular end-diastolic pressure. Recommendations: Continue medical therapy.  Distal LAD stenosis is close to the apex and not amenable to PCI.   DG Chest Portable 1 View  Result Date: 08/07/2020 CLINICAL DATA:  Shortness of breath today. EXAM: PORTABLE CHEST 1 VIEW COMPARISON:  06/08/2020 and 04/06/2020 FINDINGS: Lungs are adequately inflated without acute airspace consolidation or effusion. Calcified granuloma left base. Cardiomediastinal silhouette and remainder of the exam is unchanged. IMPRESSION: No acute cardiopulmonary disease. Electronically Signed   By: Elberta Fortis M.D.   On: 08/07/2020 12:27   ECHOCARDIOGRAM COMPLETE  Result Date: 08/08/2020    ECHOCARDIOGRAM REPORT   Patient Name:   RICHANDA DARIN [facility] Date of Exam: 08/08/2020 Medical Rec #:  [id number]          Height:       61.0 in Accession #:    [id number]         Weight:       115.2 lb Date of Birth:  03-10-1933          BSA:          1.494 m Patient Age:    87 years           BP:           135/80 mmHg Patient Gender: F                  HR:           63 bpm. Exam Location:  [facility] Procedure: 2D Echo, Cardiac Doppler and Color Doppler Indications:     Chest pain 786.50  History:         Patient has prior history of Echocardiogram examinations, most                  recent 10/08/2018. Previous Myocardial Infarction; Risk                  Factors:Hypertension and Diabetes.  Sonographer:     Cristela Blue RDCS  (AE) Referring Phys:  [id number] [facility] Diagnosing Phys: Julien Nordmann MD  Sonographer Comments: Suboptimal apical window. IMPRESSIONS  1. Left ventricular ejection fraction, by estimation, is 60 to 65%. The left ventricle has normal function. The left ventricle has no regional wall motion abnormalities. Left ventricular diastolic parameters are consistent with Grade II diastolic dysfunction (pseudonormalization).  2. Right ventricular systolic function is normal. The right ventricular size is normal. Tricuspid regurgitation signal  is inadequate for assessing PA pressure.  3. Left atrial size was mildly dilated.  4. Mild mitral valve regurgitation. FINDINGS  Left Ventricle: Left ventricular ejection fraction, by estimation, is 60 to 65%. The left ventricle has normal function. The left ventricle has no regional wall motion abnormalities. The left ventricular internal cavity size was normal in size. There is  no left ventricular hypertrophy. Left ventricular diastolic parameters are consistent with Grade II diastolic dysfunction (pseudonormalization). Right Ventricle: The right ventricular size is normal. No increase in right ventricular wall thickness. Right ventricular systolic function is normal. Tricuspid regurgitation signal is inadequate for assessing PA pressure. Left Atrium: Left atrial size was mildly dilated. Right Atrium: Right atrial size was normal in size. Pericardium: There is no evidence of pericardial effusion. Mitral Valve: The mitral valve is normal in structure. Mild mitral valve regurgitation. No evidence of mitral valve stenosis. Tricuspid Valve: The tricuspid valve is normal in structure. Tricuspid valve regurgitation is not demonstrated. No evidence of tricuspid stenosis. Aortic Valve: The aortic valve was not well visualized. Aortic valve regurgitation is not visualized. Mild to moderate aortic valve sclerosis/calcification is present, without any evidence of aortic stenosis.  Aortic valve mean gradient measures 5.7 mmHg.  Aortic valve peak gradient measures 10.1 mmHg. Aortic valve area, by VTI measures 1.88 cm. Pulmonic Valve: The pulmonic valve was normal in structure. Pulmonic valve regurgitation is not visualized. No evidence of pulmonic stenosis. Aorta: The aortic root is normal in size and structure. Venous: The inferior vena cava is normal in size with greater than 50% respiratory variability, suggesting right atrial pressure of 3 mmHg. IAS/Shunts: No atrial level shunt detected by color flow Doppler.  LEFT VENTRICLE PLAX 2D LVIDd:         4.45 cm  Diastology LVIDs:         2.17 cm  LV e' medial:    5.22 cm/s LV PW:         1.02 cm  LV E/e' medial:  21.8 LV IVS:        0.91 cm  LV e' lateral:   6.31 cm/s LVOT diam:     2.00 cm  LV E/e' lateral: 18.1 LV SV:         64 LV SV Index:   43 LVOT Area:     3.14 cm  RIGHT VENTRICLE RV Basal diam:  3.21 cm LEFT ATRIUM             Index       RIGHT ATRIUM           Index LA diam:        4.20 cm 2.81 cm/m  RA Area:     10.80 cm LA Vol (A2C):   68.9 ml 46.12 ml/m RA Volume:   20.40 ml  13.65 ml/m LA Vol (A4C):   52.0 ml 34.81 ml/m LA Biplane Vol: 63.1 ml 42.24 ml/m  AORTIC VALVE                    PULMONIC VALVE AV Area (Vmax):    1.69 cm     PV Vmax:        0.79 m/s AV Area (Vmean):   1.57 cm     PV Peak grad:   2.5 mmHg AV Area (VTI):     1.88 cm     RVOT Peak grad: 3 mmHg AV Vmax:           158.67 cm/s AV Vmean:  110.333 cm/s AV VTI:            0.340 m AV Peak Grad:      10.1 mmHg AV Mean Grad:      5.7 mmHg LVOT Vmax:         85.50 cm/s LVOT Vmean:        55.300 cm/s LVOT VTI:          0.203 m LVOT/AV VTI ratio: 0.60  AORTA Ao Root diam: 2.70 cm MITRAL VALVE MV Area (PHT): 2.69 cm     SHUNTS MV Decel Time: 282 msec     Systemic VTI:  0.20 m MV E velocity: 114.00 cm/s  Systemic Diam: 2.00 cm MV A velocity: 105.00 cm/s MV E/A ratio:  1.09 Julien Nordmann MD Electronically signed by Julien Nordmann MD Signature Date/Time:  08/08/2020/2:41:31 PM    Final     Microbiology: Recent Results (from the past 240 hour(s))  Respiratory Panel by RT PCR (Flu A&B, Covid) - Nasopharyngeal Swab     Status: None   Collection Time: 08/19/20  9:34 PM   Specimen: Nasopharyngeal Swab  Result Value Ref Range Status   SARS Coronavirus 2 by RT PCR NEGATIVE NEGATIVE Final    Comment: (NOTE) SARS-CoV-2 target nucleic acids are NOT DETECTED.  The SARS-CoV-2 RNA is generally detectable in upper respiratoy specimens during the acute phase of infection. The lowest concentration of SARS-CoV-2 viral copies this assay can detect is 131 copies/mL. A negative result does not preclude SARS-Cov-2 infection and should not be used as the sole basis for treatment or other patient management decisions. A negative result may occur with  improper specimen collection/handling, submission of specimen other than nasopharyngeal swab, presence of viral mutation(s) within the areas targeted by this assay, and inadequate number of viral copies (<131 copies/mL). A negative result must be combined with clinical observations, patient history, and epidemiological information. The expected result is Negative.  Fact Sheet for Patients:  https://www.moore.com/  Fact Sheet for Healthcare Providers:  https://www.young.biz/  This test is no t yet approved or cleared by the [place] FDA and  has been authorized for detection and/or diagnosis of SARS-CoV-2 by FDA under an Emergency Use Authorization (EUA). This EUA will remain  in effect (meaning this test can be used) for the duration of the COVID-19 declaration under Section 564(b)(1) of the Act, 21 U.S.C. section 360bbb-3(b)(1), unless the authorization is terminated or revoked sooner.     Influenza A by PCR NEGATIVE NEGATIVE Final   Influenza B by PCR NEGATIVE NEGATIVE Final    Comment: (NOTE) The Xpert Xpress SARS-CoV-2/FLU/RSV assay is intended as an aid  in  the diagnosis of influenza from Nasopharyngeal swab specimens and  should not be used as a sole basis for treatment. Nasal washings and  aspirates are unacceptable for Xpert Xpress SARS-CoV-2/FLU/RSV  testing.  Fact Sheet for Patients: https://www.moore.com/  Fact Sheet for Healthcare Providers: https://www.young.biz/  This test is not yet approved or cleared by the [place] FDA and  has been authorized for detection and/or diagnosis of SARS-CoV-2 by  FDA under an Emergency Use Authorization (EUA). This EUA will remain  in effect (meaning this test can be used) for the duration of the  Covid-19 declaration under Section 564(b)(1) of the Act, 21  U.S.C. section 360bbb-3(b)(1), unless the authorization is  terminated or revoked. Performed at [facility], [address] [place], [place] [address]      Labs: CBC: Recent Labs  Lab 08/19/20 [phone]  08/19/20 0943  WBC 15.9* 14.0*  NEUTROABS  --  8.3*  HGB 12.5 13.9  HCT 37.0 42.8  MCV 84.3 84.8  PLT 367 410*   Basic Metabolic Panel: Recent Labs  Lab 08/19/20 0456 08/19/20 0943  NA  --  138  K  --  4.0  CL  --  101  CO2  --  24  GLUCOSE  --  162*  BUN  --  16  CREATININE 0.65 0.74  CALCIUM  --  9.7   Liver Function Tests: Recent Labs  Lab 08/19/20 0943  AST 17  ALT 14  ALKPHOS 69  BILITOT 1.0  PROT 7.7  ALBUMIN 4.5   CBG: Recent Labs  Lab 08/20/20 0826 08/20/20 1149  GLUCAP 216* 305*   Time spent: 35 minutes  Signed:  Lynden Oxford  [facility] 08/20/2020 7:43 PM

## 2020-08-22 NOTE — Telephone Encounter (Signed)
Ok to schedule appt 12:00 on 08/28/20 - please confirm doing ok and if needs anything prior.

## 2020-08-24 NOTE — Telephone Encounter (Signed)
Appointment has been scheduled for this Thursday. Also patient is feeling better she just feels tired and ran down but overall fine.

## 2020-08-25 ENCOUNTER — Telehealth: Payer: Self-pay

## 2020-08-25 NOTE — Telephone Encounter (Signed)
Called [name] to ask about diabetic medication and if it comes from [employer]. We received a Fax today from [employer] asking for Advice worker. Fax cover sheet Subject reads "Patient Has Request for the Lower back supplies  .  please sign and send back. Please attach Chart Notes." The 2nd sheet has Diabetic Supply information and correct patient insurance information matching last scanned insurance card on 08/12/2020. [name] states that she does not use [employer] and all of her diabetic supplies comes from [employer]. Form was shredded.

## 2020-08-27 ENCOUNTER — Other Ambulatory Visit: Payer: Self-pay

## 2020-08-27 ENCOUNTER — Telehealth (INDEPENDENT_AMBULATORY_CARE_PROVIDER_SITE_OTHER): Payer: Medicare Other | Admitting: Internal Medicine

## 2020-08-27 DIAGNOSIS — E1159 Type 2 diabetes mellitus with other circulatory complications: Secondary | ICD-10-CM | POA: Diagnosis not present

## 2020-08-27 DIAGNOSIS — E7800 Pure hypercholesterolemia, unspecified: Secondary | ICD-10-CM

## 2020-08-27 DIAGNOSIS — D509 Iron deficiency anemia, unspecified: Secondary | ICD-10-CM

## 2020-08-27 DIAGNOSIS — I25118 Atherosclerotic heart disease of native coronary artery with other forms of angina pectoris: Secondary | ICD-10-CM | POA: Diagnosis not present

## 2020-08-27 DIAGNOSIS — I10 Essential (primary) hypertension: Secondary | ICD-10-CM

## 2020-08-27 DIAGNOSIS — R634 Abnormal weight loss: Secondary | ICD-10-CM

## 2020-08-27 DIAGNOSIS — J4520 Mild intermittent asthma, uncomplicated: Secondary | ICD-10-CM

## 2020-08-27 NOTE — Progress Notes (Signed)
[name], female   DOB: Dec 07, 1932, 86 y.o.   MRN: [id number]   Virtual Visit via telephone Note  This visit type was conducted due to national recommendations for restrictions regarding the COVID-19 pandemic (e.g. social distancing).  This format is felt to be most appropriate for this patient at this time.  All issues noted in this document were discussed and addressed.  No physical exam was performed (except for noted visual exam findings with Video Visits).   I connected with [name] by telephone and verified that I am speaking with the correct person using two identifiers. Location patient: home Location provider: work  Persons participating in the virtual visit: patient, provider  The limitations, risks, security and privacy concerns of performing an evaluation and management service by telephone and the availability of in person appointments have been discussed.  It has also been discussed with the patient that there may be a patient responsible charge related to this service. The patient expressed understanding and agreed to proceed.   Reason for visit: hospital follow up.   HPI: Evaluated 08/19/20 in ER for sob and chest pain. CT chest - no PE, no acute pulmonary infiltrate.  Per note review, started wheezing while in ER.  Given IV steroids and nebs with noted improvement. Isosorbide added to her medication regimen.  She reports that since her discharge, she has felt good.  Breathing better.  No increased cough or congestion.  No increased sob.  Eating. No nausea or vomiting.  Bowels moving.  Does report starting this pm, noticed sore restlessness in her lower legs.  She has been walking around today and cleaning her house.  No weakness or unsteadiness.  Discussed further w/up and evaluation. She wants to monitor.     ROS: See pertinent positives and negatives per HPI.  Past Medical History:  Diagnosis Date  . Aortic insufficiency    a. TTE 12/19: EF  55-60%, probable HK of the mid apical anterior septal myocardium, Gr1DD, mild AI, mildly dilated LA  . Asthma   . CAD (coronary artery disease)    a. NSTEMI 12/19; b. LHC 10/08/18: LM minimal luminal irregs, mLAD-1 95% s/p PCI/DES, mLAD-2 60%, LCx mild diffuse disease throughout, RCA minimal luminal irregs; b. 03/2020 MV: EF>65%, no ischemia/scar.  . Diabetes mellitus ([place])   . Hypercholesterolemia   . Hypertension   . Myocardial infarction ([place])   . Osteopenia   . Palpitations    a. 04/2020 Zio: Avg HR 75. 429 SVT episodes, longest 19 secs @ 133. Occas PACs (3.2%). Rare PVCs (<1%).  . Polymyalgia rheumatica syndrome ([place])   . Reactive airway disease     Past Surgical History:  Procedure Laterality Date  . ABDOMINAL HYSTERECTOMY  1981   prolapse and bleeding, ovaries not removed  . BREAST EXCISIONAL BIOPSY Right   . CHOLECYSTECTOMY N/A 09/02/2019   Procedure: LAPAROSCOPIC CHOLECYSTECTOMY WITH INTRAOPERATIVE CHOLANGIOGRAM;  Surgeon: Olean Ree, MD;  Location: [facility];  Service: General;  Laterality: N/A;  . CORONARY STENT INTERVENTION N/A 10/08/2018   Procedure: CORONARY STENT INTERVENTION;  Surgeon: Wellington Hampshire, MD;  Location: [place] [facility];  Service: Cardiovascular;  Laterality: N/A;  . ENDOSCOPIC RETROGRADE CHOLANGIOPANCREATOGRAPHY (ERCP) WITH PROPOFOL N/A 08/08/2019   Procedure: ENDOSCOPIC RETROGRADE CHOLANGIOPANCREATOGRAPHY (ERCP) WITH PROPOFOL;  Surgeon: Lucilla Lame, MD;  Location: [facility];  Service: Endoscopy;  Laterality: N/A;  . LEFT HEART CATH AND CORONARY ANGIOGRAPHY N/A 10/08/2018   Procedure: LEFT HEART CATH AND CORONARY ANGIOGRAPHY;  Surgeon:  Wellington Hampshire, MD;  Location: [place] [facility];  Service: Cardiovascular;  Laterality: N/A;  . LEFT HEART CATH AND CORONARY ANGIOGRAPHY N/A 08/10/2020   Procedure: LEFT HEART CATH AND CORONARY ANGIOGRAPHY possible percutaneous intervention;  Surgeon: Wellington Hampshire, MD;  Location: [place] [facility];   Service: Cardiovascular;  Laterality: N/A;  . UMBILICAL HERNIA REPAIR  7/94    Family History  Problem Relation Age of Onset  . Heart attack Father   . Arthritis Mother   . Heart disease Mother   . Throat cancer Sister   . Parkinson's disease Sister   . COPD Brother     SOCIAL HX: reviewed.    Current Outpatient Medications:  .  ADVAIR DISKUS 250-50 MCG/DOSE AEPB, INHALE ONE PUFF BY MOUTH EVERY 12 HOURS. RINSE MOUTH AFTER EACH USE (Patient taking differently: Inhale 1 puff into the lungs 2 (two) times daily. ), Disp: 60 each, Rfl: 5 .  albuterol (PROVENTIL) (2.5 MG/3ML) 0.083% nebulizer solution, Take 3 mLs (2.5 mg total) by nebulization every 4 (four) hours as needed for wheezing or shortness of breath., Disp: 75 mL, Rfl: 1 .  alendronate (FOSAMAX) 70 MG tablet, Take 70 mg by mouth once a week. , Disp: , Rfl:  .  aspirin 81 MG tablet, Take 81 mg by mouth daily., Disp: , Rfl:  .  atorvastatin (LIPITOR) 80 MG tablet, Take 1 tablet (80 mg total) by mouth daily at 6 PM., Disp: 90 tablet, Rfl: 3 .  ferrous sulfate 325 (65 FE) MG tablet, Take 325 mg by mouth daily with breakfast., Disp: , Rfl:  .  fluticasone (FLONASE) 50 MCG/ACT nasal spray, Use 2 spray(s) in each nostril once daily (Patient taking differently: Place 2 sprays into both nostrils daily. ), Disp: 16 g, Rfl: 0 .  isosorbide mononitrate (IMDUR) 30 MG 24 hr tablet, Take 1 tablet (30 mg total) by mouth daily., Disp: 30 tablet, Rfl: 0 .  lisinopril (ZESTRIL) 20 MG tablet, Take 1 tablet by mouth once daily, Disp: 90 tablet, Rfl: 1 .  metFORMIN (GLUCOPHAGE) 500 MG tablet, TAKE 1 TABLET BY MOUTH TWICE DAILY WITH A MEAL (Patient taking differently: Take 500 mg by mouth 2 (two) times daily with a meal. ), Disp: 180 tablet, Rfl: 0 .  metoprolol succinate (TOPROL-XL) 50 MG 24 hr tablet, Take 1 tablet (50 mg total) by mouth daily. Take with or immediately following a meal., Disp: 30 tablet, Rfl: 0 .  metoprolol tartrate (LOPRESSOR) 25 MG  tablet, Take 1 tablet (25 mg total) by mouth daily as needed (if Heart rate is >120)., Disp: 30 tablet, Rfl: 0 .  Multiple Vitamins-Minerals (VISION FORMULA/LUTEIN PO), Take 1 tablet by mouth daily. , Disp: , Rfl:  .  Nebulizers (COMPRESSOR/NEBULIZER) MISC, 1 Units by Does not apply route as directed., Disp: 1 each, Rfl: 0 .  nitroGLYCERIN (NITROSTAT) 0.4 MG SL tablet, Place 1 tablet (0.4 mg total) under the tongue every 5 (five) minutes as needed for chest pain., Disp: 30 tablet, Rfl: 12 .  predniSONE (DELTASONE) 10 MG tablet, Take 87m daily for 3days,Take 245mdaily for 3days,Take 1053maily for 3days, then stop, Disp: 18 tablet, Rfl: 0 .  PROAIR HFA 108 (90 Base) MCG/ACT inhaler, Inhale 2 puffs into the lungs every 4 (four) hours as needed for wheezing or shortness of breath., Disp: 18 g, Rfl: 0 .  Vitamin D, Ergocalciferol, (DRISDOL) 50000 units CAPS capsule, Take 50,000 Units by mouth once a week., Disp: , Rfl: 3  EXAM:  GENERAL: alert.  Sounds to be in no acute distress.  Answering questions appropriately.   PSYCH/NEURO: pleasant and cooperative, no obvious depression or anxiety, speech and thought processing grossly intact  ASSESSMENT AND PLAN:  Discussed the following assessment and plan:  Problem List Items Addressed This Visit    Weight loss    Reports weight 118 pounds.  Up from 115 pounds previous visit.  Follow.        Reactive airway disease    Breathing better.  Denies increased sob.  Continues advair.  Has albuterol neb if needed.  Follow.        Hypertension    Blood pressure has been doing well on lisinopril.  Follow pressures.  Follow metabolic panel.       Hypercholesterolemia    On lipitor.  Follow lipid panel and liver function tests.        Diabetes mellitus with cardiac complication (HCC)    On metformin.  Sugars stable.  Follow met b and a1c.       Coronary artery disease    Known CAD.  S/p PCE/DES - mid LAD.  On aspirin.  Isosorbide added.  Followed  by cardiology.  No chest pain or increased sob currently.   Follow.        Anemia    Follow cbc.            I discussed the assessment and treatment plan with the patient. The patient was provided an opportunity to ask questions and all were answered. The patient agreed with the plan and demonstrated an understanding of the instructions.   The patient was advised to call back or seek an in-person evaluation if the symptoms worsen or if the condition fails to improve as anticipated.  I provided 24 minutes of non-face-to-face time during this encounter.   Einar Pheasant, MD

## 2020-09-06 ENCOUNTER — Encounter: Payer: Self-pay | Admitting: Internal Medicine

## 2020-09-06 NOTE — Assessment & Plan Note (Signed)
On lipitor.  Follow lipid panel and liver function tests.   

## 2020-09-06 NOTE — Assessment & Plan Note (Signed)
Known CAD.  S/p PCE/DES - mid LAD.  On aspirin.  Isosorbide added.  Followed by cardiology.  No chest pain or increased sob currently.   Follow.

## 2020-09-06 NOTE — Assessment & Plan Note (Signed)
Breathing better.  Denies increased sob.  Continues advair.  Has albuterol neb if needed.  Follow.

## 2020-09-06 NOTE — Assessment & Plan Note (Signed)
Blood pressure has been doing well on lisinopril.  Follow pressures.  Follow metabolic panel.  

## 2020-09-06 NOTE — Assessment & Plan Note (Signed)
Follow cbc.  

## 2020-09-06 NOTE — Assessment & Plan Note (Signed)
On metformin.  Sugars stable.  Follow met b and a1c.

## 2020-09-06 NOTE — Assessment & Plan Note (Signed)
Reports weight 118 pounds.  Up from 115 pounds previous visit.  Follow.

## 2020-09-15 ENCOUNTER — Encounter: Payer: Self-pay | Admitting: Cardiovascular Disease

## 2020-09-15 ENCOUNTER — Ambulatory Visit (INDEPENDENT_AMBULATORY_CARE_PROVIDER_SITE_OTHER): Payer: Medicare Other | Admitting: Cardiovascular Disease

## 2020-09-15 ENCOUNTER — Other Ambulatory Visit: Payer: Self-pay

## 2020-09-15 ENCOUNTER — Ambulatory Visit: Payer: Medicare Other

## 2020-09-15 VITALS — BP 120/60 | HR 74 | Ht 61.0 in | Wt 118.0 lb

## 2020-09-15 DIAGNOSIS — I10 Essential (primary) hypertension: Secondary | ICD-10-CM

## 2020-09-15 DIAGNOSIS — I2510 Atherosclerotic heart disease of native coronary artery without angina pectoris: Secondary | ICD-10-CM

## 2020-09-15 DIAGNOSIS — E785 Hyperlipidemia, unspecified: Secondary | ICD-10-CM

## 2020-09-15 NOTE — Patient Instructions (Signed)

## 2020-09-15 NOTE — Progress Notes (Signed)
Cardiology Office Note   Date:  09/15/2020   ID:  [place] Melea, Prezioso October 02, 1933, MRN [id number]  PCP:  Dale Ithaca, MD  Cardiologist:   Lorine Bears, MD   Chief Complaint  Patient presents with  . OTHER    F/u hospital NSTEMI/SOB no complaints today. Meds reviewed verbally with pt.      History of Present Illness: Tanya Cole is a 86 y.o. female who presents for a follow-up visit regarding coronary artery disease. She has known history of coronary artery disease, COPD, diabetes, hypertension and hyperlipidemia.  She had non-ST elevation myocardial infarction in December 2019.  Cardiac catheterization showed 95% mid LAD stenosis and 60% distal LAD stenosis.  The mid LAD stenosis was treated successfully with PCI and drug-eluting stent placement.   She was diagnosed with blood loss anemia earlier this year with a hemoglobin of 8.4.  Plavix was discontinued.   She presented in October with a small non-STEMI.  Cardiac catheterization was performed which showed significant one-vessel coronary artery disease with patent proximal LAD stent with mild in-stent restenosis, stable mid LAD stenosis at 60% and significant distal LAD stenosis at 90% close to the apex.  Ejection fraction was normal and LVEDP was mildly elevated.  Distal LAD stenosis was felt to be the culprit but was not amenable to PCI given distal location.  She returned to the emergency room few weeks after with chest pain and shortness of breath.  Troponin was normal.  She was noted to have intermittent episodes of atrial tachycardia and frequent PACs.  The dose of metoprolol was increased. She has been doing well with no recurrent chest pain or shortness of breath.  No palpitations.   Past Medical History:  Diagnosis Date  . Aortic insufficiency    a. TTE 12/19: EF 55-60%, probable HK of the mid apical anterior septal myocardium, Gr1DD, mild AI, mildly dilated LA  . Asthma   . CAD (coronary artery disease)     a. NSTEMI 12/19; b. LHC 10/08/18: LM minimal luminal irregs, mLAD-1 95% s/p PCI/DES, mLAD-2 60%, LCx mild diffuse disease throughout, RCA minimal luminal irregs; b. 03/2020 MV: EF>65%, no ischemia/scar.  . Diabetes mellitus ([facility])   . Hypercholesterolemia   . Hypertension   . Myocardial infarction ([facility])   . Osteopenia   . Palpitations    a. 04/2020 Zio: Avg HR 75. 429 SVT episodes, longest 19 secs @ 133. Occas PACs (3.2%). Rare PVCs (<1%).  . Polymyalgia rheumatica syndrome ([facility])   . Reactive airway disease     Past Surgical History:  Procedure Laterality Date  . ABDOMINAL HYSTERECTOMY  1981   prolapse and bleeding, ovaries not removed  . BREAST EXCISIONAL BIOPSY Right   . CHOLECYSTECTOMY N/A 09/02/2019   Procedure: LAPAROSCOPIC CHOLECYSTECTOMY WITH INTRAOPERATIVE CHOLANGIOGRAM;  Surgeon: Henrene Dodge, MD;  Location: [facility];  Service: General;  Laterality: N/A;  . CORONARY STENT INTERVENTION N/A 10/08/2018   Procedure: CORONARY STENT INTERVENTION;  Surgeon: Iran Ouch, MD;  Location: [facility];  Service: Cardiovascular;  Laterality: N/A;  . ENDOSCOPIC RETROGRADE CHOLANGIOPANCREATOGRAPHY (ERCP) WITH PROPOFOL N/A 08/08/2019   Procedure: ENDOSCOPIC RETROGRADE CHOLANGIOPANCREATOGRAPHY (ERCP) WITH PROPOFOL;  Surgeon: Midge Minium, MD;  Location: [facility];  Service: Endoscopy;  Laterality: N/A;  . LEFT HEART CATH AND CORONARY ANGIOGRAPHY N/A 10/08/2018   Procedure: LEFT HEART CATH AND CORONARY ANGIOGRAPHY;  Surgeon: Iran Ouch, MD;  Location: [facility];  Service: Cardiovascular;  Laterality: N/A;  . LEFT  HEART CATH AND CORONARY ANGIOGRAPHY N/A 08/10/2020   Procedure: LEFT HEART CATH AND CORONARY ANGIOGRAPHY possible percutaneous intervention;  Surgeon: Iran Ouch, MD;  Location: [facility];  Service: Cardiovascular;  Laterality: N/A;  . UMBILICAL HERNIA REPAIR  7/94     Current Outpatient Medications  Medication Sig Dispense Refill   . ADVAIR DISKUS 250-50 MCG/DOSE AEPB INHALE ONE PUFF BY MOUTH EVERY 12 HOURS. RINSE MOUTH AFTER EACH USE (Patient taking differently: Inhale 1 puff into the lungs 2 (two) times daily. ) 60 each 5  . albuterol (PROVENTIL) (2.5 MG/3ML) 0.083% nebulizer solution Take 3 mLs (2.5 mg total) by nebulization every 4 (four) hours as needed for wheezing or shortness of breath. 75 mL 1  . alendronate (FOSAMAX) 70 MG tablet Take 70 mg by mouth once a week.     Marland Kitchen aspirin 81 MG tablet Take 81 mg by mouth daily.    Marland Kitchen atorvastatin (LIPITOR) 80 MG tablet Take 1 tablet (80 mg total) by mouth daily at 6 PM. 90 tablet 3  . ferrous sulfate 325 (65 FE) MG tablet Take 325 mg by mouth once a week.     . fluticasone (FLONASE) 50 MCG/ACT nasal spray Use 2 spray(s) in each nostril once daily (Patient taking differently: Place 2 sprays into both nostrils daily. ) 16 g 0  . isosorbide mononitrate (IMDUR) 30 MG 24 hr tablet Take 1 tablet (30 mg total) by mouth daily. 30 tablet 0  . lisinopril (ZESTRIL) 20 MG tablet Take 1 tablet by mouth once daily 90 tablet 1  . metFORMIN (GLUCOPHAGE) 500 MG tablet TAKE 1 TABLET BY MOUTH TWICE DAILY WITH A MEAL (Patient taking differently: Take 500 mg by mouth 2 (two) times daily with a meal. ) 180 tablet 0  . metoprolol succinate (TOPROL-XL) 50 MG 24 hr tablet Take 1 tablet (50 mg total) by mouth daily. Take with or immediately following a meal. 30 tablet 0  . metoprolol tartrate (LOPRESSOR) 25 MG tablet Take 1 tablet (25 mg total) by mouth daily as needed (if Heart rate is >120). 30 tablet 0  . Multiple Vitamins-Minerals (VISION FORMULA/LUTEIN PO) Take 1 tablet by mouth daily.     . Nebulizers (COMPRESSOR/NEBULIZER) MISC 1 Units by Does not apply route as directed. 1 each 0  . nitroGLYCERIN (NITROSTAT) 0.4 MG SL tablet Place 1 tablet (0.4 mg total) under the tongue every 5 (five) minutes as needed for chest pain. 30 tablet 12  . PROAIR HFA 108 (90 Base) MCG/ACT inhaler Inhale 2 puffs into the  lungs every 4 (four) hours as needed for wheezing or shortness of breath. 18 g 0  . Vitamin D, Ergocalciferol, (DRISDOL) 50000 units CAPS capsule Take 50,000 Units by mouth once a week.  3   No current facility-administered medications for this visit.    Allergies:   Tramadol    Social History:  The patient  reports that she has never smoked. She has never used smokeless tobacco. She reports that she does not drink alcohol and does not use drugs.   Family History:  The patient's family history includes Arthritis in her mother; COPD in her brother; Heart attack in her father; Heart disease in her mother; Parkinson's disease in her sister; Throat cancer in her sister.    ROS:  Please see the history of present illness.   Otherwise, review of systems are positive for none.   All other systems are reviewed and negative.    PHYSICAL EXAM: VS:  BP 120/60 (BP Location: Left Arm, Patient Position: Sitting, Cuff Size: Normal)   Pulse 74   Ht 5\' 1"  (1.549 m)   Wt 118 lb (53.5 kg)   SpO2 97%   BMI 22.30 kg/m  , BMI Body mass index is 22.3 kg/m. GEN: Well nourished, well developed, in no acute distress  HEENT: normal  Neck: no JVD, carotid bruits, or masses Cardiac: RRR; no murmurs, rubs, or gallops,no edema  Respiratory:  clear to auscultation bilaterally, normal work of breathing GI: soft, nontender, nondistended, + BS MS: no deformity or atrophy  Skin: warm and dry, no rash Neuro:  Strength and sensation are intact Psych: euthymic mood, full affect Right radial pulses normal with no hematoma.  EKG:  EKG is ordered today. The ekg ordered today demonstrates normal sinus rhythm.  No significant ST or T wave changes.   Recent Labs: 09/17/2019: TSH 3.59 08/07/2020: B Natriuretic Peptide 259.6 08/10/2020: Magnesium 1.7 08/19/2020: ALT 14; BUN 16; Creatinine, Ser 0.74; Hemoglobin 13.9; Platelets 410; Potassium 4.0; Sodium 138    Lipid Panel    Component Value Date/Time   CHOL 116  08/08/2020 0556   TRIG 164 (H) 08/08/2020 0556   HDL 46 08/08/2020 0556   CHOLHDL 2.5 08/08/2020 0556   VLDL 33 08/08/2020 0556   LDLCALC 37 08/08/2020 0556   LDLDIRECT 136.0 07/03/2020 1139      Wt Readings from Last 3 Encounters:  09/15/20 118 lb (53.5 kg)  08/27/20 115 lb (52.2 kg)  08/19/20 115 lb (52.2 kg)     No flowsheet data found.    ASSESSMENT AND PLAN:  1.  Coronary artery disease involving native coronary arteries without angina: Most recent cardiac catheterization in October showed significant distal LAD stenosis close to the apex which was left to be treated medically.  Continue medical therapy.  Currently with no anginal symptoms.  2.  Essential hypertension: Blood pressure is well controlled on metoprolol and lisinopril.  3.  Hyperlipidemia: Continue high-dose atorvastatin.  I reviewed her most recent lipid profile which showed an LDL of 37 and triglyceride of 164.  4.  Atrial tachycardia: Symptoms are well controlled on metoprolol.   Disposition:   FU with me in 6 months  Signed,  Lorine Bears, MD  09/15/2020 2:04 PM    [facility]

## 2020-09-16 ENCOUNTER — Other Ambulatory Visit: Payer: Self-pay

## 2020-09-16 ENCOUNTER — Ambulatory Visit (INDEPENDENT_AMBULATORY_CARE_PROVIDER_SITE_OTHER): Payer: Medicare Other

## 2020-09-16 DIAGNOSIS — E538 Deficiency of other specified B group vitamins: Secondary | ICD-10-CM

## 2020-09-16 MED ORDER — CYANOCOBALAMIN 1000 MCG/ML IJ SOLN
1000.0000 ug | Freq: Once | INTRAMUSCULAR | Status: AC
  Administered 2020-09-16: 1000 ug via INTRAMUSCULAR

## 2020-09-16 NOTE — Progress Notes (Addendum)
Patient presented for B 12 injection to left deltoid, patient voiced no concerns nor showed any signs of distress during injection.  Reviewed.  Dr Scott 

## 2020-09-21 ENCOUNTER — Telehealth (INDEPENDENT_AMBULATORY_CARE_PROVIDER_SITE_OTHER): Payer: Medicare Other | Admitting: Gastroenterology

## 2020-09-21 ENCOUNTER — Other Ambulatory Visit: Payer: Self-pay

## 2020-09-21 ENCOUNTER — Encounter: Payer: Self-pay | Admitting: Gastroenterology

## 2020-09-21 DIAGNOSIS — D509 Iron deficiency anemia, unspecified: Secondary | ICD-10-CM | POA: Diagnosis not present

## 2020-09-22 NOTE — Progress Notes (Signed)
Tanya Bouillon, MD [address]  [address]  [place], [place] [address]  Main: [phone]  Fax: [phone]   Primary Care Physician: Dale [place], MD  Virtual Visit via Telephone Note  I connected with patient on 09/22/20 at 10:00 AM EST by telephone and verified that I am speaking with the correct person using two identifiers.   I discussed the limitations, risks, security and privacy concerns of performing an evaluation and management service by telephone and the availability of in person appointments. I also discussed with the patient that there may be a patient responsible charge related to this service. The patient expressed understanding and agreed to proceed.  Location of Patient: Home Location of Provider: Home Persons involved: Patient and provider only during the visit (nursing staff and front desk staff was involved in communicating with the patient prior to the appointment, reviewing medications and checking them in)   History of Present Illness: Chief complaint: Anemia  HPI: Tanya Cole is a 86 y.o. female patient presents for follow-up of iron deficiency anemia.  Patient has previously had EGD and colonoscopy scheduled but did not tolerate prep.  Different preps were tried, including tablet preps and patient did not tolerated well due to nausea and vomiting.    Since her last visit patient was hospitalized with NSTEMI and is being followed by cardiology  Current Outpatient Medications  Medication Sig Dispense Refill  . ADVAIR DISKUS 250-50 MCG/DOSE AEPB INHALE ONE PUFF BY MOUTH EVERY 12 HOURS. RINSE MOUTH AFTER EACH USE (Patient taking differently: Inhale 1 puff into the lungs 2 (two) times daily. ) 60 each 5  . albuterol (PROVENTIL) (2.5 MG/3ML) 0.083% nebulizer solution Take 3 mLs (2.5 mg total) by nebulization every 4 (four) hours as needed for wheezing or shortness of breath. 75 mL 1  . alendronate (FOSAMAX) 70 MG tablet Take 70 mg by mouth  once a week.     Marland Kitchen aspirin 81 MG tablet Take 81 mg by mouth daily.    Marland Kitchen atorvastatin (LIPITOR) 80 MG tablet Take 1 tablet (80 mg total) by mouth daily at 6 PM. 90 tablet 3  . ferrous sulfate 325 (65 FE) MG tablet Take 325 mg by mouth once a week.     . fluticasone (FLONASE) 50 MCG/ACT nasal spray Use 2 spray(s) in each nostril once daily (Patient taking differently: Place 2 sprays into both nostrils daily. ) 16 g 0  . isosorbide mononitrate (IMDUR) 30 MG 24 hr tablet Take 1 tablet (30 mg total) by mouth daily. 30 tablet 0  . lisinopril (ZESTRIL) 20 MG tablet Take 1 tablet by mouth once daily 90 tablet 1  . metFORMIN (GLUCOPHAGE) 500 MG tablet TAKE 1 TABLET BY MOUTH TWICE DAILY WITH A MEAL (Patient taking differently: Take 500 mg by mouth 2 (two) times daily with a meal. ) 180 tablet 0  . metoprolol succinate (TOPROL-XL) 50 MG 24 hr tablet Take 1 tablet (50 mg total) by mouth daily. Take with or immediately following a meal. 30 tablet 0  . metoprolol tartrate (LOPRESSOR) 25 MG tablet Take 1 tablet (25 mg total) by mouth daily as needed (if Heart rate is >120). 30 tablet 0  . Multiple Vitamins-Minerals (VISION FORMULA/LUTEIN PO) Take 1 tablet by mouth daily.     . Nebulizers (COMPRESSOR/NEBULIZER) MISC 1 Units by Does not apply route as directed. 1 each 0  . nitroGLYCERIN (NITROSTAT) 0.4 MG SL tablet Place 1 tablet (0.4 mg total) under the tongue every 5 (  five) minutes as needed for chest pain. 30 tablet 12  . PROAIR HFA 108 (90 Base) MCG/ACT inhaler Inhale 2 puffs into the lungs every 4 (four) hours as needed for wheezing or shortness of breath. 18 g 0  . Vitamin D, Ergocalciferol, (DRISDOL) 50000 units CAPS capsule Take 50,000 Units by mouth once a week.  3   No current facility-administered medications for this visit.    Allergies as of 09/21/2020 - Review Complete 09/21/2020  Allergen Reaction Noted  . Tramadol Itching and Nausea And Vomiting 04/29/2016    Review of Systems:    All systems  reviewed and negative except where noted in HPI.   Observations/Objective:  Labs: CMP     Component Value Date/Time   NA 138 08/19/2020 0943   NA 137 10/18/2018 1022   NA 135 (L) 02/18/2013 2004   K 4.0 08/19/2020 0943   K 4.1 02/18/2013 2004   CL 101 08/19/2020 0943   CL 102 02/18/2013 2004   CO2 24 08/19/2020 0943   CO2 27 02/18/2013 2004   GLUCOSE 162 (H) 08/19/2020 0943   GLUCOSE 186 (H) 02/18/2013 2004   BUN 16 08/19/2020 0943   BUN 14 10/18/2018 1022   BUN 26 (H) 02/18/2013 2004   CREATININE 0.74 08/19/2020 0943   CREATININE 0.69 04/29/2016 1613   CALCIUM 9.7 08/19/2020 0943   CALCIUM 9.5 02/18/2013 2004   PROT 7.7 08/19/2020 0943   PROT 7.5 02/18/2013 2004   ALBUMIN 4.5 08/19/2020 0943   ALBUMIN 3.6 02/18/2013 2004   AST 17 08/19/2020 0943   AST 13 (L) 02/18/2013 2004   ALT 14 08/19/2020 0943   ALT 15 02/18/2013 2004   ALKPHOS 69 08/19/2020 0943   ALKPHOS 71 02/18/2013 2004   BILITOT 1.0 08/19/2020 0943   BILITOT 0.5 02/18/2013 2004   GFRNONAA >60 08/19/2020 0943   GFRNONAA 52 (L) 02/18/2013 2004   GFRAA >60 07/20/2020 1305   GFRAA >60 02/18/2013 2004   Lab Results  Component Value Date   WBC 14.0 (H) 08/19/2020   HGB 13.9 08/19/2020   HCT 42.8 08/19/2020   MCV 84.8 08/19/2020   PLT 410 (H) 08/19/2020    Imaging Studies: No results found.  Assessment and Plan:   Tanya Cole is a 86 y.o. y/o female with iron deficiency anemia here for follow-up  Assessment and Plan: We discussed risks and benefits of endoscopic procedures given her iron deficiency anemia, and need for ruling out malignancy.  Patient understood the risks and benefits in details, and refuses to schedule her procedures at this time.  Given her recent hospitalization for NSTEMI, if endoscopy is needed in the future, patient will need cardiac clearance.  However, since patient is refusing all procedures at this time, continue close follow-up with PCP  PCP can refer back to [place] if  patient changes her mind in the future  Follow Up Instructions:    I discussed the assessment and treatment plan with the patient. The patient was provided an opportunity to ask questions and all were answered. The patient agreed with the plan and demonstrated an understanding of the instructions.   The patient was advised to call back or seek an in-person evaluation if the symptoms worsen or if the condition fails to improve as anticipated.  I provided of non-face-to-face time during this encounter. Additional time was spent in reviewing patient's chart, placing orders etc.   Pasty Spillers, MD  Speech recognition software was used to dictate this note.

## 2020-09-29 ENCOUNTER — Other Ambulatory Visit: Payer: Self-pay | Admitting: Internal Medicine

## 2020-09-30 ENCOUNTER — Encounter: Payer: Self-pay | Admitting: Internal Medicine

## 2020-10-05 DIAGNOSIS — M069 Rheumatoid arthritis, unspecified: Secondary | ICD-10-CM | POA: Diagnosis not present

## 2020-10-05 DIAGNOSIS — G894 Chronic pain syndrome: Secondary | ICD-10-CM | POA: Diagnosis not present

## 2020-10-05 DIAGNOSIS — E782 Mixed hyperlipidemia: Secondary | ICD-10-CM | POA: Diagnosis not present

## 2020-10-05 DIAGNOSIS — Z0000 Encounter for general adult medical examination without abnormal findings: Secondary | ICD-10-CM | POA: Diagnosis not present

## 2020-10-05 DIAGNOSIS — I10 Essential (primary) hypertension: Secondary | ICD-10-CM | POA: Diagnosis not present

## 2020-10-08 ENCOUNTER — Emergency Department: Payer: Medicare Other

## 2020-10-08 ENCOUNTER — Inpatient Hospital Stay
Admission: EM | Admit: 2020-10-08 | Discharge: 2020-10-11 | DRG: 190 | Disposition: A | Discharge status: Home / Self Care | Payer: Medicare Other | Attending: Internal Medicine | Admitting: Internal Medicine

## 2020-10-08 ENCOUNTER — Telehealth: Payer: Self-pay

## 2020-10-08 ENCOUNTER — Other Ambulatory Visit: Payer: Self-pay

## 2020-10-08 DIAGNOSIS — I959 Hypotension, unspecified: Secondary | ICD-10-CM | POA: Diagnosis not present

## 2020-10-08 DIAGNOSIS — I130 Hypertensive heart and chronic kidney disease with heart failure and stage 1 through stage 4 chronic kidney disease, or unspecified chronic kidney disease: Secondary | ICD-10-CM | POA: Diagnosis not present

## 2020-10-08 DIAGNOSIS — I2510 Atherosclerotic heart disease of native coronary artery without angina pectoris: Secondary | ICD-10-CM | POA: Diagnosis present

## 2020-10-08 DIAGNOSIS — Z955 Presence of coronary angioplasty implant and graft: Secondary | ICD-10-CM | POA: Diagnosis not present

## 2020-10-08 DIAGNOSIS — I25118 Atherosclerotic heart disease of native coronary artery with other forms of angina pectoris: Secondary | ICD-10-CM | POA: Diagnosis present

## 2020-10-08 DIAGNOSIS — Z8261 Family history of arthritis: Secondary | ICD-10-CM

## 2020-10-08 DIAGNOSIS — R778 Other specified abnormalities of plasma proteins: Secondary | ICD-10-CM | POA: Diagnosis not present

## 2020-10-08 DIAGNOSIS — Z7983 Long term (current) use of bisphosphonates: Secondary | ICD-10-CM

## 2020-10-08 DIAGNOSIS — Z20822 Contact with and (suspected) exposure to covid-19: Secondary | ICD-10-CM | POA: Diagnosis present

## 2020-10-08 DIAGNOSIS — J9601 Acute respiratory failure with hypoxia: Secondary | ICD-10-CM | POA: Diagnosis present

## 2020-10-08 DIAGNOSIS — Z8249 Family history of ischemic heart disease and other diseases of the circulatory system: Secondary | ICD-10-CM

## 2020-10-08 DIAGNOSIS — E785 Hyperlipidemia, unspecified: Secondary | ICD-10-CM | POA: Diagnosis present

## 2020-10-08 DIAGNOSIS — R0602 Shortness of breath: Secondary | ICD-10-CM

## 2020-10-08 DIAGNOSIS — N183 Chronic kidney disease, stage 3 unspecified: Secondary | ICD-10-CM | POA: Diagnosis not present

## 2020-10-08 DIAGNOSIS — Z825 Family history of asthma and other chronic lower respiratory diseases: Secondary | ICD-10-CM

## 2020-10-08 DIAGNOSIS — R000 Tachycardia, unspecified: Secondary | ICD-10-CM | POA: Diagnosis not present

## 2020-10-08 DIAGNOSIS — Z79899 Other long term (current) drug therapy: Secondary | ICD-10-CM

## 2020-10-08 DIAGNOSIS — I10 Essential (primary) hypertension: Secondary | ICD-10-CM | POA: Diagnosis present

## 2020-10-08 DIAGNOSIS — E1122 Type 2 diabetes mellitus with diabetic chronic kidney disease: Secondary | ICD-10-CM | POA: Diagnosis present

## 2020-10-08 DIAGNOSIS — I351 Nonrheumatic aortic (valve) insufficiency: Secondary | ICD-10-CM | POA: Diagnosis present

## 2020-10-08 DIAGNOSIS — E119 Type 2 diabetes mellitus without complications: Secondary | ICD-10-CM | POA: Diagnosis present

## 2020-10-08 DIAGNOSIS — Z90710 Acquired absence of both cervix and uterus: Secondary | ICD-10-CM

## 2020-10-08 DIAGNOSIS — Z9049 Acquired absence of other specified parts of digestive tract: Secondary | ICD-10-CM

## 2020-10-08 DIAGNOSIS — R0689 Other abnormalities of breathing: Secondary | ICD-10-CM | POA: Diagnosis not present

## 2020-10-08 DIAGNOSIS — I5033 Acute on chronic diastolic (congestive) heart failure: Secondary | ICD-10-CM

## 2020-10-08 DIAGNOSIS — J441 Chronic obstructive pulmonary disease with (acute) exacerbation: Secondary | ICD-10-CM | POA: Diagnosis not present

## 2020-10-08 DIAGNOSIS — Z66 Do not resuscitate: Secondary | ICD-10-CM | POA: Diagnosis not present

## 2020-10-08 DIAGNOSIS — E1159 Type 2 diabetes mellitus with other circulatory complications: Secondary | ICD-10-CM | POA: Insufficient documentation

## 2020-10-08 DIAGNOSIS — E1140 Type 2 diabetes mellitus with diabetic neuropathy, unspecified: Secondary | ICD-10-CM | POA: Diagnosis not present

## 2020-10-08 DIAGNOSIS — Z885 Allergy status to narcotic agent status: Secondary | ICD-10-CM

## 2020-10-08 DIAGNOSIS — M810 Age-related osteoporosis without current pathological fracture: Secondary | ICD-10-CM | POA: Diagnosis present

## 2020-10-08 DIAGNOSIS — Z7984 Long term (current) use of oral hypoglycemic drugs: Secondary | ICD-10-CM

## 2020-10-08 DIAGNOSIS — M353 Polymyalgia rheumatica: Secondary | ICD-10-CM | POA: Diagnosis not present

## 2020-10-08 DIAGNOSIS — Z743 Need for continuous supervision: Secondary | ICD-10-CM | POA: Diagnosis not present

## 2020-10-08 DIAGNOSIS — Z7982 Long term (current) use of aspirin: Secondary | ICD-10-CM | POA: Diagnosis not present

## 2020-10-08 DIAGNOSIS — Z9079 Acquired absence of other genital organ(s): Secondary | ICD-10-CM | POA: Diagnosis not present

## 2020-10-08 DIAGNOSIS — D631 Anemia in chronic kidney disease: Secondary | ICD-10-CM | POA: Diagnosis not present

## 2020-10-08 DIAGNOSIS — R0989 Other specified symptoms and signs involving the circulatory and respiratory systems: Secondary | ICD-10-CM | POA: Diagnosis not present

## 2020-10-08 DIAGNOSIS — Z7951 Long term (current) use of inhaled steroids: Secondary | ICD-10-CM

## 2020-10-08 DIAGNOSIS — I252 Old myocardial infarction: Secondary | ICD-10-CM | POA: Diagnosis not present

## 2020-10-08 DIAGNOSIS — R062 Wheezing: Secondary | ICD-10-CM | POA: Diagnosis not present

## 2020-10-08 DIAGNOSIS — R6889 Other general symptoms and signs: Secondary | ICD-10-CM | POA: Diagnosis not present

## 2020-10-08 LAB — CBG MONITORING, ED: Glucose-Capillary: 232 mg/dL — ABNORMAL HIGH (ref 70–99)

## 2020-10-08 LAB — COMPREHENSIVE METABOLIC PANEL
ALT: 14 U/L (ref 0–44)
AST: 20 U/L (ref 15–41)
Albumin: 4.2 g/dL (ref 3.5–5.0)
Alkaline Phosphatase: 79 U/L (ref 38–126)
Anion gap: 12 (ref 5–15)
BUN: 19 mg/dL (ref 8–23)
CO2: 21 mmol/L — ABNORMAL LOW (ref 22–32)
Calcium: 9.1 mg/dL (ref 8.9–10.3)
Chloride: 106 mmol/L (ref 98–111)
Creatinine, Ser: 0.92 mg/dL (ref 0.44–1.00)
GFR, Estimated: >60 mL/min (ref >60)
Glucose, Bld: 204 mg/dL — ABNORMAL HIGH (ref 70–99)
Potassium: 4.5 mmol/L (ref 3.5–5.1)
Sodium: 139 mmol/L (ref 135–145)
Total Bilirubin: 0.7 mg/dL (ref 0.3–1.2)
Total Protein: 7.7 g/dL (ref 6.5–8.1)

## 2020-10-08 LAB — CBC WITH DIFFERENTIAL/PLATELET
Abs Immature Granulocytes: 0.05 10*3/uL (ref 0.00–0.07)
Basophils Absolute: 0.1 10*3/uL (ref 0.0–0.1)
Basophils Relative: 0 %
Eosinophils Absolute: 0.4 10*3/uL (ref 0.0–0.5)
Eosinophils Relative: 3 %
HCT: 41.5 % (ref 36.0–46.0)
Hemoglobin: 13.7 g/dL (ref 12.0–15.0)
Immature Granulocytes: 0 %
Lymphocytes Relative: 6 %
Lymphs Abs: 0.8 10*3/uL (ref 0.7–4.0)
MCH: 29.3 pg (ref 26.0–34.0)
MCHC: 33 g/dL (ref 30.0–36.0)
MCV: 88.9 fL (ref 80.0–100.0)
Monocytes Absolute: 0.2 10*3/uL (ref 0.1–1.0)
Monocytes Relative: 1 %
Neutro Abs: 11 10*3/uL — ABNORMAL HIGH (ref 1.7–7.7)
Neutrophils Relative %: 90 %
Platelets: 247 10*3/uL (ref 150–400)
RBC: 4.67 MIL/uL (ref 3.87–5.11)
RDW: 14.2 % (ref 11.5–15.5)
WBC: 12.5 10*3/uL — ABNORMAL HIGH (ref 4.0–10.5)
nRBC: 0 % (ref 0.0–0.2)

## 2020-10-08 LAB — BRAIN NATRIURETIC PEPTIDE: B Natriuretic Peptide: 238.3 pg/mL — ABNORMAL HIGH (ref 0.0–100.0)

## 2020-10-08 LAB — RESP PANEL BY RT-PCR (FLU A&B, COVID) ARPGX2
Influenza A by PCR: NEGATIVE
Influenza B by PCR: NEGATIVE
SARS Coronavirus 2 by RT PCR: NEGATIVE

## 2020-10-08 LAB — TROPONIN I (HIGH SENSITIVITY): Troponin I (High Sensitivity): 35 ng/L — ABNORMAL HIGH (ref <18)

## 2020-10-08 LAB — MAGNESIUM: Magnesium: 2.3 mg/dL (ref 1.7–2.4)

## 2020-10-08 MED ORDER — ONDANSETRON HCL 4 MG/2ML IJ SOLN: 4.0000 mg | Freq: Four times a day (QID) | INTRAMUSCULAR | Status: DC | PRN

## 2020-10-08 MED ORDER — SODIUM CHLORIDE 0.9 % IV SOLN
250.0000 mL | INTRAVENOUS | Status: DC | PRN
Start: 2020-10-08 — End: 2020-10-11

## 2020-10-08 MED ORDER — PREDNISONE 20 MG PO TABS
40.0000 mg | ORAL_TABLET | Freq: Every day | ORAL | Status: DC
  Administered 2020-10-10 – 2020-10-11 (×2): 40 mg via ORAL
  Filled 2020-10-08: qty 4
  Filled 2020-10-08: qty 2

## 2020-10-08 MED ORDER — ISOSORBIDE MONONITRATE ER 30 MG PO TB24
30.0000 mg | ORAL_TABLET | Freq: Every day | ORAL | Status: DC
  Administered 2020-10-09 – 2020-10-11 (×3): 30 mg via ORAL
  Filled 2020-10-08 (×3): qty 1

## 2020-10-08 MED ORDER — FUROSEMIDE 10 MG/ML IJ SOLN
20.0000 mg | Freq: Two times a day (BID) | INTRAMUSCULAR | Status: DC
  Administered 2020-10-09 – 2020-10-10 (×3): 20 mg via INTRAVENOUS
  Filled 2020-10-08: qty 2
  Filled 2020-10-08: qty 4
  Filled 2020-10-08: qty 2

## 2020-10-08 MED ORDER — SODIUM CHLORIDE 0.9% FLUSH: 3.0000 mL | INTRAVENOUS | Status: DC | PRN

## 2020-10-08 MED ORDER — METOPROLOL SUCCINATE ER 50 MG PO TB24
50.0000 mg | ORAL_TABLET | Freq: Every day | ORAL | Status: DC
  Administered 2020-10-09 – 2020-10-11 (×3): 50 mg via ORAL
  Filled 2020-10-08 (×3): qty 1

## 2020-10-08 MED ORDER — INSULIN ASPART 100 UNIT/ML ~~LOC~~ SOLN
0.0000 [IU] | Freq: Every day | SUBCUTANEOUS | Status: DC
  Administered 2020-10-09: 2 [IU] via SUBCUTANEOUS
  Filled 2020-10-08: qty 1

## 2020-10-08 MED ORDER — ATORVASTATIN CALCIUM 80 MG PO TABS
80.0000 mg | ORAL_TABLET | Freq: Every day | ORAL | Status: DC
  Administered 2020-10-09 – 2020-10-10 (×2): 80 mg via ORAL
  Filled 2020-10-08 (×2): qty 1

## 2020-10-08 MED ORDER — ALBUTEROL SULFATE (2.5 MG/3ML) 0.083% IN NEBU
5.0000 mg | INHALATION_SOLUTION | Freq: Once | RESPIRATORY_TRACT | Status: AC
  Administered 2020-10-08: 5 mg via RESPIRATORY_TRACT
  Filled 2020-10-08: qty 6

## 2020-10-08 MED ORDER — ENOXAPARIN SODIUM 40 MG/0.4ML ~~LOC~~ SOLN
40.0000 mg | SUBCUTANEOUS | Status: DC
  Administered 2020-10-09 – 2020-10-11 (×3): 40 mg via SUBCUTANEOUS
  Filled 2020-10-08 (×3): qty 0.4

## 2020-10-08 MED ORDER — INSULIN ASPART 100 UNIT/ML ~~LOC~~ SOLN
0.0000 [IU] | Freq: Three times a day (TID) | SUBCUTANEOUS | Status: DC
  Administered 2020-10-09: 7 [IU] via SUBCUTANEOUS
  Administered 2020-10-09: 2 [IU] via SUBCUTANEOUS
  Administered 2020-10-10 (×3): 5 [IU] via SUBCUTANEOUS
  Administered 2020-10-11: 12:00:00 2 [IU] via SUBCUTANEOUS
  Administered 2020-10-11: 09:00:00 1 [IU] via SUBCUTANEOUS
  Filled 2020-10-08 (×7): qty 1

## 2020-10-08 MED ORDER — ACETAMINOPHEN 325 MG PO TABS
650.0000 mg | ORAL_TABLET | ORAL | Status: DC | PRN
  Administered 2020-10-11: 650 mg via ORAL
  Filled 2020-10-08: qty 2

## 2020-10-08 MED ORDER — ALBUTEROL SULFATE (2.5 MG/3ML) 0.083% IN NEBU: 2.5000 mg | INHALATION_SOLUTION | RESPIRATORY_TRACT | Status: DC | PRN

## 2020-10-08 MED ORDER — IPRATROPIUM BROMIDE 0.02 % IN SOLN
0.5000 mg | Freq: Once | RESPIRATORY_TRACT | Status: AC
  Administered 2020-10-08: 0.5 mg via RESPIRATORY_TRACT
  Filled 2020-10-08: qty 2.5

## 2020-10-08 MED ORDER — LISINOPRIL 20 MG PO TABS
20.0000 mg | ORAL_TABLET | Freq: Every day | ORAL | Status: DC
  Administered 2020-10-09 – 2020-10-11 (×3): 20 mg via ORAL
  Filled 2020-10-08: qty 2
  Filled 2020-10-08 (×2): qty 1

## 2020-10-08 MED ORDER — SODIUM CHLORIDE 0.9% FLUSH
3.0000 mL | Freq: Two times a day (BID) | INTRAVENOUS | Status: DC
  Administered 2020-10-09 – 2020-10-11 (×5): 3 mL via INTRAVENOUS

## 2020-10-08 MED ORDER — NITROGLYCERIN 0.4 MG SL SUBL: 0.4000 mg | SUBLINGUAL_TABLET | SUBLINGUAL | Status: DC | PRN

## 2020-10-08 MED ORDER — METHYLPREDNISOLONE SODIUM SUCC 40 MG IJ SOLR
40.0000 mg | Freq: Four times a day (QID) | INTRAMUSCULAR | Status: AC
  Administered 2020-10-09 (×4): 40 mg via INTRAVENOUS
  Filled 2020-10-08 (×4): qty 1

## 2020-10-08 MED ORDER — IPRATROPIUM-ALBUTEROL 0.5-2.5 (3) MG/3ML IN SOLN
3.0000 mL | Freq: Four times a day (QID) | RESPIRATORY_TRACT | Status: DC
  Administered 2020-10-09 – 2020-10-11 (×8): 3 mL via RESPIRATORY_TRACT
  Filled 2020-10-08 (×9): qty 3

## 2020-10-08 NOTE — H&P (Addendum)
History and Physical    [name] GUY:[id number] DOB: [birth year] DOA: 10/08/2020  PCP: Dale Nooksack, MD   Patient coming from: Home  I have personally briefly reviewed patient's old medical records in [facility]  Chief Complaint: Shortness of breath  HPI: Tanya Cole is a 86 y.o. female with medical history significant for CAD, with recent cath 10/21 showing severe single-vessel disease with mild in-stent restenosis, HTN, DM, COPD, diastolic heart failure, last EF 60 to 65% on 10/19, with history of recent hospitalization in October for chest pain who presents to the emergency room by EMS with a complaint of wheezing.  She has a dry cough but no fever or chills and no nausea vomiting or diarrhea.  She received duo nebs and Solu-Medrol in route. ED Course: On arrival she had increased work of breathing.  Afebrile, BP 162/77 heart rate 78, respirations 26 with O2 sat 92% on room air.  Blood work significant for WBC of 12,000, troponin 35 and BNP 238.  Electrolytes mostly within normal limits.  Covid and flu test negative EKG as reviewed by me : Normal sinus rhythm rate is 78 with no acute ST-T wave changes Chest x-ray shows mild pulmonary vascular edema Patient was treated with additional DuoNeb's in the ER.  Hospitalist consulted for admission.  Review of Systems: As per HPI otherwise all other systems on review of systems negative.    Past Medical History:  Diagnosis Date  . Aortic insufficiency    a. TTE 12/19: EF 55-60%, probable HK of the mid apical anterior septal myocardium, Gr1DD, mild AI, mildly dilated LA  . Asthma   . CAD (coronary artery disease)    a. NSTEMI 12/19; b. LHC 10/08/18: LM minimal luminal irregs, mLAD-1 95% s/p PCI/DES, mLAD-2 60%, LCx mild diffuse disease throughout, RCA minimal luminal irregs; b. 03/2020 MV: EF>65%, no ischemia/scar.  . Diabetes mellitus (HCC)   . Hypercholesterolemia   . Hypertension   . Myocardial infarction (HCC)   .  Osteopenia   . Palpitations    a. 04/2020 Zio: Avg HR 75. 429 SVT episodes, longest 19 secs @ 133. Occas PACs (3.2%). Rare PVCs (<1%).  . Polymyalgia rheumatica syndrome (HCC)   . Reactive airway disease     Past Surgical History:  Procedure Laterality Date  . ABDOMINAL HYSTERECTOMY  1981   prolapse and bleeding, ovaries not removed  . BREAST EXCISIONAL BIOPSY Right   . CHOLECYSTECTOMY N/A 09/02/2019   Procedure: LAPAROSCOPIC CHOLECYSTECTOMY WITH INTRAOPERATIVE CHOLANGIOGRAM;  Surgeon: Henrene Dodge, MD;  Location: [facility] ORS;  Service: General;  Laterality: N/A;  . CORONARY STENT INTERVENTION N/A 10/08/2018   Procedure: CORONARY STENT INTERVENTION;  Surgeon: Iran Ouch, MD;  Location: [facility] INVASIVE CV LAB;  Service: Cardiovascular;  Laterality: N/A;  . ENDOSCOPIC RETROGRADE CHOLANGIOPANCREATOGRAPHY (ERCP) WITH PROPOFOL N/A 08/08/2019   Procedure: ENDOSCOPIC RETROGRADE CHOLANGIOPANCREATOGRAPHY (ERCP) WITH PROPOFOL;  Surgeon: Midge Minium, MD;  Location: [facility] ENDOSCOPY;  Service: Endoscopy;  Laterality: N/A;  . LEFT HEART CATH AND CORONARY ANGIOGRAPHY N/A 10/08/2018   Procedure: LEFT HEART CATH AND CORONARY ANGIOGRAPHY;  Surgeon: Iran Ouch, MD;  Location: [facility] INVASIVE CV LAB;  Service: Cardiovascular;  Laterality: N/A;  . LEFT HEART CATH AND CORONARY ANGIOGRAPHY N/A 08/10/2020   Procedure: LEFT HEART CATH AND CORONARY ANGIOGRAPHY possible percutaneous intervention;  Surgeon: Iran Ouch, MD;  Location: [facility] INVASIVE CV LAB;  Service: Cardiovascular;  Laterality: N/A;  . UMBILICAL HERNIA REPAIR  7/94     reports that she  has never smoked. She has never used smokeless tobacco. She reports that she does not drink alcohol and does not use drugs.  Allergies  Allergen Reactions  . Tramadol Itching and Nausea And Vomiting    Family History  Problem Relation Age of Onset  . Heart attack Father   . Arthritis Mother   . Heart disease Mother   . Throat cancer Sister   .  Parkinson's disease Sister   . COPD Brother       Prior to Admission medications   Medication Sig Start Date End Date Taking? Authorizing Provider  ADVAIR DISKUS 250-50 MCG/DOSE AEPB INHALE ONE PUFF BY MOUTH EVERY 12 HOURS. RINSE MOUTH AFTER EACH USE Patient taking differently: Inhale 1 puff into the lungs 2 (two) times daily.  02/17/16   Dale [place], MD  albuterol (PROVENTIL) (2.5 MG/3ML) 0.083% nebulizer solution Take 3 mLs (2.5 mg total) by nebulization every 4 (four) hours as needed for wheezing or shortness of breath. 08/18/20   Dale [place], MD  alendronate (FOSAMAX) 70 MG tablet Take 70 mg by mouth once a week.  11/06/17   [provider]  aspirin 81 MG tablet Take 81 mg by mouth daily.    [provider]  atorvastatin (LIPITOR) 80 MG tablet Take 1 tablet (80 mg total) by mouth daily at 6 PM. 04/01/20   Alver Sorrow, NP  ferrous sulfate 325 (65 FE) MG tablet Take 325 mg by mouth once a week.     [provider]  fluticasone (FLONASE) 50 MCG/ACT nasal spray Use 2 spray(s) in each nostril once daily Patient taking differently: Place 2 sprays into both nostrils daily.  08/03/20   Dale [place], MD  isosorbide mononitrate (IMDUR) 30 MG 24 hr tablet Take 1 tablet (30 mg total) by mouth daily. 08/11/20   Arnetha Courser, MD  lisinopril (ZESTRIL) 20 MG tablet Take 1 tablet by mouth once daily 06/11/20   Dunn, Raymon Mutton, PA-C  metFORMIN (GLUCOPHAGE) 500 MG tablet TAKE 1 TABLET BY MOUTH TWICE DAILY WITH A MEAL 09/29/20   Dale [place], MD  metoprolol succinate (TOPROL-XL) 50 MG 24 hr tablet Take 1 tablet (50 mg total) by mouth daily. Take with or immediately following a meal. 08/21/20   Rolly Salter, MD  metoprolol tartrate (LOPRESSOR) 25 MG tablet Take 1 tablet (25 mg total) by mouth daily as needed (if Heart rate is >120). 08/20/20   Rolly Salter, MD  Multiple Vitamins-Minerals (VISION FORMULA/LUTEIN PO) Take 1 tablet by mouth daily.     [provider]  Nebulizers (COMPRESSOR/NEBULIZER) MISC 1 Units by Does not apply route as directed. 04/07/20   Dionne Bucy, MD  nitroGLYCERIN (NITROSTAT) 0.4 MG SL tablet Place 1 tablet (0.4 mg total) under the tongue every 5 (five) minutes as needed for chest pain. 10/09/18   Adrian Saran, MD  PROAIR HFA 108 (90 Base) MCG/ACT inhaler Inhale 2 puffs into the lungs every 4 (four) hours as needed for wheezing or shortness of breath. 08/20/20   Rolly Salter, MD  Vitamin D, Ergocalciferol, (DRISDOL) 50000 units CAPS capsule Take 50,000 Units by mouth once a week. 06/24/18   [provider]    Physical Exam: Vitals:   10/08/20 2130 10/08/20 2200 10/08/20 2230 10/08/20 2300  BP: (!) 160/64 (!) 152/71 (!) 146/58 137/64  Pulse: 68 75 73 80  Resp:  18 19 (!) 36  Temp:      TempSrc:      SpO2: 96% 92%  95% 95%  Weight:      Height:         Vitals:   10/08/20 2130 10/08/20 2200 10/08/20 2230 10/08/20 2300  BP: (!) 160/64 (!) 152/71 (!) 146/58 137/64  Pulse: 68 75 73 80  Resp:  18 19 (!) 36  Temp:      TempSrc:      SpO2: 96% 92% 95% 95%  Weight:      Height:          Constitutional: Chronically ill-appearing Alert and oriented x 3 .  Conversational dyspnea HEENT:      Head: Normocephalic and atraumatic.         Eyes: PERLA, EOMI, Conjunctivae are normal. Sclera is non-icteric.       Mouth/Throat: Mucous membranes are moist.       Neck: Supple with no signs of meningismus. Cardiovascular: Regular rate and rhythm. No murmurs, gallops, or rubs. 2+ symmetrical distal pulses are present . No JVD. No LE edema Respiratory: Respiratory effort increased with scattered rhonchi few bibasilar crackles Gastrointestinal: Soft, non tender, and non distended with positive bowel sounds. No rebound or guarding. Genitourinary: No CVA tenderness. Musculoskeletal: Nontender with normal range of motion in all extremities. No cyanosis, or erythema of extremities. Neurologic:  Face is symmetric. Moving  all extremities. No gross focal neurologic deficits . Skin: Skin is warm, dry.  No rash or ulcers Psychiatric: Mood and affect are normal    Labs on Admission: I have personally reviewed following labs and imaging studies  CBC: Recent Labs  Lab 10/08/20 2112  WBC 12.5*  NEUTROABS 11.0*  HGB 13.7  HCT 41.5  MCV 88.9  PLT 247   Basic Metabolic Panel: Recent Labs  Lab 10/08/20 2112  NA 139  K 4.5  CL 106  CO2 21*  GLUCOSE 204*  BUN 19  CREATININE 0.92  CALCIUM 9.1  MG 2.3   GFR: Estimated Creatinine Clearance: 33.7 mL/min (by C-G formula based on SCr of 0.92 mg/dL). Liver Function Tests: Recent Labs  Lab 10/08/20 2112  AST 20  ALT 14  ALKPHOS 79  BILITOT 0.7  PROT 7.7  ALBUMIN 4.2   No results for input(s): LIPASE, AMYLASE in the last 168 hours. No results for input(s): AMMONIA in the last 168 hours. Coagulation Profile: No results for input(s): INR, PROTIME in the last 168 hours. Cardiac Enzymes: No results for input(s): CKTOTAL, CKMB, CKMBINDEX, TROPONINI in the last 168 hours. BNP (last 3 results) No results for input(s): PROBNP in the last 8760 hours. HbA1C: No results for input(s): HGBA1C in the last 72 hours. CBG: No results for input(s): GLUCAP in the last 168 hours. Lipid Profile: No results for input(s): CHOL, HDL, LDLCALC, TRIG, CHOLHDL, LDLDIRECT in the last 72 hours. Thyroid Function Tests: No results for input(s): TSH, T4TOTAL, FREET4, T3FREE, THYROIDAB in the last 72 hours. Anemia Panel: No results for input(s): VITAMINB12, FOLATE, FERRITIN, TIBC, IRON, RETICCTPCT in the last 72 hours. Urine analysis:    Component Value Date/Time   COLORURINE YELLOW (A) 08/07/2020 1212   APPEARANCEUR CLEAR (A) 08/07/2020 1212   APPEARANCEUR Clear 02/18/2013 2004   LABSPEC 1.011 08/07/2020 1212   LABSPEC 1.024 02/18/2013 2004   PHURINE 6.0 08/07/2020 1212   GLUCOSEU NEGATIVE 08/07/2020 1212   GLUCOSEU NEGATIVE 01/28/2015 0810   HGBUR NEGATIVE  08/07/2020 1212   BILIRUBINUR NEGATIVE 08/07/2020 1212   BILIRUBINUR neg 01/16/2015 1437   BILIRUBINUR Negative 02/18/2013 2004   KETONESUR NEGATIVE 08/07/2020 1212   PROTEINUR  100 (A) 08/07/2020 1212   UROBILINOGEN 0.2 01/28/2015 0810   NITRITE NEGATIVE 08/07/2020 1212   LEUKOCYTESUR NEGATIVE 08/07/2020 1212   LEUKOCYTESUR Trace 02/18/2013 2004    Radiological Exams on Admission: DG Chest Portable 1 View  Result Date: 10/08/2020 CLINICAL DATA:  COPD EXAM: PORTABLE CHEST 1 VIEW COMPARISON:  August 19, 2020 FINDINGS: The heart size and mediastinal contours are within normal limits. Aortic knob calcifications are seen. Increased prominence of the central pulmonary vasculature is seen. The visualized skeletal structures are unremarkable. IMPRESSION: Mild pulmonary vascular congestion. Electronically Signed   By: Jonna Clark M.D.   On: 10/08/2020 19:31     Assessment/Plan 86 year old female with history of CAD, with recent cath 10/21 showing severe single-vessel disease with mild in-stent restenosis, HTN, DM, COPD, diastolic heart failure, last EF 60 to 65% on 10/19, presenting by EMS with shortness of breath and wheezing    COPD with acute exacerbation (HCC) -Scheduled and as needed nebulized bronchodilator treatment -IV steroids -Supplemental oxygen as needed -Flutter valve    Acute on chronic heart failure with preserved ejection fraction (HFpEF) (HCC) -Patient presents with shortness of breath and wheezing but with elevated BNP of 259 above most recent baseline in the 230s a month prior as well as pulmonary vascular congestion on chest x-ray -Continue home Toprol, lisinopril -IV Lasix, low-dose -Daily weights salt and fluid restriction -Last EF 10/21 showing grade 2 diastolic dysfunction EF 60 to 82%    Coronary artery disease with history of NSTEMI   Elevated troponin -Patient denied chest pain and EKG is nonacute -Elevated troponin of 35 up from 6 a month prior -Had cardiac  cath 10/21 that showed severe single-vessel disease being treated medically -Continue Imdur, metoprolol, atorvastatin, antiplatelet    Hypertension -Continue lisinopril and metoprolol    Diabetes mellitus with cardiac complication (HCC) -Sliding scale insulin coverage    DVT prophylaxis: Lovenox  Code Status: DNR  Family Communication:  none  Disposition Plan: Back to previous home environment Consults called: none  Status:At the time of admission, it appears that the appropriate admission status for this patient is INPATIENT. This is judged to be reasonable and necessary in order to provide the required intensity of service to ensure the patient's safety given the presenting symptoms, physical exam findings, and initial radiographic and laboratory data in the context of their  Comorbid conditions.   Patient requires inpatient status due to high intensity of service, high risk for further deterioration and high frequency of surveillance required.   I certify that at the point of admission it is my clinical judgment that the patient will require inpatient hospital care spanning beyond 2 midnights     Andris Baumann MD [facility]     10/08/2020, 11:14 PM

## 2020-10-08 NOTE — ED Notes (Signed)
Pharmacy contacted requesting verification of medications for administration.  °

## 2020-10-08 NOTE — ED Notes (Signed)
Pt presents to ED with c/o of having SOB that started yesterday, pt states HX of COPD. Pt denies any other s/s at this time. Pt states took duoneb at home before calling 911 with no relief. EMS gave 2 duonebs, 125 mg IVP solumedrol, and 2mg  of mag PTA. Pt presents with a RA sat of 93%, pt denies chronically wearing 02. Pt has exp wheezes throughout all lobes posterior and anterior. Pt is A&O4. Minimal distress noted as pt states she feels better after steroid and mag. Pt denies N/V/D. Pt is A&Ox4.

## 2020-10-08 NOTE — ED Triage Notes (Signed)
Pt arrives EMS from home with SOB, HX of COPD

## 2020-10-08 NOTE — Telephone Encounter (Signed)
Ok

## 2020-10-08 NOTE — Telephone Encounter (Signed)
Pt called and states that her COPD is acting up and would like a rx for prednisone

## 2020-10-08 NOTE — Telephone Encounter (Signed)
Called and spoke to Tanya Cole. Tanya Cole scheduled for 10/09/2020 at 3pm for a virtual visit. Tanya Cole verbalized understanding and had no further questions.

## 2020-10-08 NOTE — Telephone Encounter (Signed)
Need to know what is going on - need more information - sob, cough, fever, etc?  If acute symptoms, needs visit (virtual visit?).

## 2020-10-08 NOTE — ED Provider Notes (Signed)
[facility] Emergency Department Provider Note ____________________________________________   Event Date/Time   First MD Initiated Contact with Patient 10/08/20 1832     (approximate)  I have reviewed the triage vital signs and the nursing notes.  HISTORY  Chief Complaint Shortness of Breath   HPI Tanya Cole is a 86 y.o. femalewho presents to the ED for evaluation of shortness of breath.   Chart review indicates history CAD, COPD, DM, HTN and HLD.  NSTEMI in the past with multivessel disease and stenting to her LAD.  Most recent LHC in October showing distal LAD stenosis not amenable to DES.  Patient reports developing shortness of breath throughout the day today with associated nonproductive cough, dyspnea on exertion.  Denies any chest pain, syncope, emesis, diaphoresis, trauma, abdominal pain, fevers or changes to her home medications.  She reports mild improvement of her symptoms with EMS DuoNeb's, but still remains short of breath.  Daughter arrives at the bedside during her ED course and provides additional history, indicating that she noticed her mother being short of breath and reported nonproductive cough yesterday with some chest tightness.  Patient minimizes this chest tightness and reports that she feels better now.   Past Medical History:  Diagnosis Date  . Aortic insufficiency    a. TTE 12/19: EF 55-60%, probable HK of the mid apical anterior septal myocardium, Gr1DD, mild AI, mildly dilated LA  . Asthma   . CAD (coronary artery disease)    a. NSTEMI 12/19; b. LHC 10/08/18: LM minimal luminal irregs, mLAD-1 95% s/p PCI/DES, mLAD-2 60%, LCx mild diffuse disease throughout, RCA minimal luminal irregs; b. 03/2020 MV: EF>65%, no ischemia/scar.  . Diabetes mellitus (HCC)   . Hypercholesterolemia   . Hypertension   . Myocardial infarction (HCC)   . Osteopenia   . Palpitations    a. 04/2020 Zio: Avg HR 75. 429 SVT episodes, longest 19 secs @  133. Occas PACs (3.2%). Rare PVCs (<1%).  . Polymyalgia rheumatica syndrome (HCC)   . Reactive airway disease     Patient Active Problem List   Diagnosis Date Noted  . COPD with acute exacerbation (HCC) 08/19/2020  . History of non-ST elevation myocardial infarction (NSTEMI) 08/07/2020  . Asthma 08/07/2020  . Leukocytosis 08/07/2020  . Left shoulder pain 07/12/2020  . Iron deficiency 04/16/2020  . Anemia 04/04/2020  . SOB (shortness of breath) 03/25/2020  . Cough 09/30/2019  . Gallstone pancreatitis   . Pre-op evaluation 08/30/2019  . Cholecystitis 08/05/2019  . Choledocholithiasis   . Respiratory illness 06/18/2019  . Coronary artery disease 12/29/2018  . Chest pain 10/07/2018  . Memory change 05/27/2018  . Osteoporosis 02/05/2018  . Weight loss 06/24/2017  . Abdominal pain, left lower quadrant 10/05/2016  . Long term current use of systemic steroids 05/16/2016  . Elevated erythrocyte sedimentation rate 05/04/2016  . Back pain 04/29/2016  . Fatigue 05/24/2015  . Health care maintenance 01/25/2015  . UTI (urinary tract infection) 07/07/2014  . Neuropathy 03/24/2014  . Diverticulitis 02/24/2013  . Reactive airway disease 09/15/2012  . Hypertension 09/14/2012  . Hypercholesterolemia 09/14/2012  . Diabetes mellitus with cardiac complication (HCC) 09/14/2012    Past Surgical History:  Procedure Laterality Date  . ABDOMINAL HYSTERECTOMY  1981   prolapse and bleeding, ovaries not removed  . BREAST EXCISIONAL BIOPSY Right   . CHOLECYSTECTOMY N/A 09/02/2019   Procedure: LAPAROSCOPIC CHOLECYSTECTOMY WITH INTRAOPERATIVE CHOLANGIOGRAM;  Surgeon: Henrene Dodge, MD;  Location: [facility] ORS;  Service: General;  Laterality: N/A;  .  CORONARY STENT INTERVENTION N/A 10/08/2018   Procedure: CORONARY STENT INTERVENTION;  Surgeon: Iran Ouch, MD;  Location: [facility] INVASIVE CV LAB;  Service: Cardiovascular;  Laterality: N/A;  . ENDOSCOPIC RETROGRADE CHOLANGIOPANCREATOGRAPHY (ERCP) WITH  PROPOFOL N/A 08/08/2019   Procedure: ENDOSCOPIC RETROGRADE CHOLANGIOPANCREATOGRAPHY (ERCP) WITH PROPOFOL;  Surgeon: Midge Minium, MD;  Location: [facility] ENDOSCOPY;  Service: Endoscopy;  Laterality: N/A;  . LEFT HEART CATH AND CORONARY ANGIOGRAPHY N/A 10/08/2018   Procedure: LEFT HEART CATH AND CORONARY ANGIOGRAPHY;  Surgeon: Iran Ouch, MD;  Location: [facility] INVASIVE CV LAB;  Service: Cardiovascular;  Laterality: N/A;  . LEFT HEART CATH AND CORONARY ANGIOGRAPHY N/A 08/10/2020   Procedure: LEFT HEART CATH AND CORONARY ANGIOGRAPHY possible percutaneous intervention;  Surgeon: Iran Ouch, MD;  Location: [facility] INVASIVE CV LAB;  Service: Cardiovascular;  Laterality: N/A;  . UMBILICAL HERNIA REPAIR  7/94    Prior to Admission medications   Medication Sig Start Date End Date Taking? Authorizing Provider  ADVAIR DISKUS 250-50 MCG/DOSE AEPB INHALE ONE PUFF BY MOUTH EVERY 12 HOURS. RINSE MOUTH AFTER EACH USE Patient taking differently: Inhale 1 puff into the lungs 2 (two) times daily.  02/17/16   Dale Nitro, MD  albuterol (PROVENTIL) (2.5 MG/3ML) 0.083% nebulizer solution Take 3 mLs (2.5 mg total) by nebulization every 4 (four) hours as needed for wheezing or shortness of breath. 08/18/20   Dale Miranda, MD  alendronate (FOSAMAX) 70 MG tablet Take 70 mg by mouth once a week.  11/06/17   [provider]  aspirin 81 MG tablet Take 81 mg by mouth daily.    [provider]  atorvastatin (LIPITOR) 80 MG tablet Take 1 tablet (80 mg total) by mouth daily at 6 PM. 04/01/20   Alver Sorrow, NP  ferrous sulfate 325 (65 FE) MG tablet Take 325 mg by mouth once a week.     [provider]  fluticasone (FLONASE) 50 MCG/ACT nasal spray Use 2 spray(s) in each nostril once daily Patient taking differently: Place 2 sprays into both nostrils daily.  08/03/20   Dale Gould, MD  isosorbide mononitrate (IMDUR) 30 MG 24 hr tablet Take 1 tablet (30 mg total) by mouth daily. 08/11/20    Arnetha Courser, MD  lisinopril (ZESTRIL) 20 MG tablet Take 1 tablet by mouth once daily 06/11/20   Dunn, Raymon Mutton, PA-C  metFORMIN (GLUCOPHAGE) 500 MG tablet TAKE 1 TABLET BY MOUTH TWICE DAILY WITH A MEAL 09/29/20   Dale , MD  metoprolol succinate (TOPROL-XL) 50 MG 24 hr tablet Take 1 tablet (50 mg total) by mouth daily. Take with or immediately following a meal. 08/21/20   Rolly Salter, MD  metoprolol tartrate (LOPRESSOR) 25 MG tablet Take 1 tablet (25 mg total) by mouth daily as needed (if Heart rate is >120). 08/20/20   Rolly Salter, MD  Multiple Vitamins-Minerals (VISION FORMULA/LUTEIN PO) Take 1 tablet by mouth daily.     [provider]  Nebulizers (COMPRESSOR/NEBULIZER) MISC 1 Units by Does not apply route as directed. 04/07/20   Dionne Bucy, MD  nitroGLYCERIN (NITROSTAT) 0.4 MG SL tablet Place 1 tablet (0.4 mg total) under the tongue every 5 (five) minutes as needed for chest pain. 10/09/18   Adrian Saran, MD  PROAIR HFA 108 (90 Base) MCG/ACT inhaler Inhale 2 puffs into the lungs every 4 (four) hours as needed for wheezing or shortness of breath. 08/20/20   Rolly Salter, MD  Vitamin D, Ergocalciferol, (DRISDOL) 50000 units CAPS capsule Take 50,000 Units  by mouth once a week. 06/24/18   [provider]    Allergies Tramadol  Family History  Problem Relation Age of Onset  . Heart attack Father   . Arthritis Mother   . Heart disease Mother   . Throat cancer Sister   . Parkinson's disease Sister   . COPD Brother     Social History Social History   Tobacco Use  . Smoking status: Never Smoker  . Smokeless tobacco: Never Used  Vaping Use  . Vaping Use: Never used  Substance Use Topics  . Alcohol use: No    Alcohol/week: 0.0 standard drinks  . Drug use: No    Review of Systems  Constitutional: No fever/chills Eyes: No visual changes. ENT: No sore throat. Cardiovascular: Positive for chest pain. Respiratory: Positive shortness of breath  and dyspnea on exertion.  Positive for nonproductive cough. Gastrointestinal: No abdominal pain.  No nausea, no vomiting.  No diarrhea.  No constipation. Genitourinary: Negative for dysuria. Musculoskeletal: Negative for back pain. Skin: Negative for rash. Neurological: Negative for headaches, focal weakness or numbness.  ____________________________________________   PHYSICAL EXAM:  VITAL SIGNS: Vitals:   10/08/20 2130 10/08/20 2200  BP: (!) 160/64 (!) 152/71  Pulse: 68 75  Resp:  18  Temp:    SpO2: 96% 92%     Constitutional: Alert and oriented.  Uncomfortable-appearing.  Conversational in full sentences. Eyes: Conjunctivae are normal. PERRL. EOMI. Head: Atraumatic. Nose: No congestion/rhinnorhea. Mouth/Throat: Mucous membranes are moist.  Oropharynx non-erythematous. Neck: No stridor. No cervical spine tenderness to palpation. Cardiovascular: Normal rate, regular rhythm. Grossly normal heart sounds.  Good peripheral circulation. Respiratory: Mild tachypnea to the mid 20s.  No further evidence of distress.  Poor air movement throughout and diffuse expiratory wheezes without focal features. Gastrointestinal: Soft , nondistended, nontender to palpation. No CVA tenderness. Musculoskeletal: No lower extremity tenderness nor edema.  No joint effusions. No signs of acute trauma. Neurologic:  Normal speech and language. No gross focal neurologic deficits are appreciated. No gait instability noted. Skin:  Skin is warm, dry and intact. No rash noted. Psychiatric: Mood and affect are normal. Speech and behavior are normal.  ____________________________________________   LABS (all labs ordered are listed, but only abnormal results are displayed)  Labs Reviewed  COMPREHENSIVE METABOLIC PANEL - Abnormal; Notable for the following components:      Result Value   CO2 21 (*)    Glucose, Bld 204 (*)    All other components within normal limits  CBC WITH DIFFERENTIAL/PLATELET -  Abnormal; Notable for the following components:   WBC 12.5 (*)    Neutro Abs 11.0 (*)    All other components within normal limits  BRAIN NATRIURETIC PEPTIDE - Abnormal; Notable for the following components:   B Natriuretic Peptide 238.3 (*)    All other components within normal limits  TROPONIN I (HIGH SENSITIVITY) - Abnormal; Notable for the following components:   Troponin I (High Sensitivity) 35 (*)    All other components within normal limits  RESP PANEL BY RT-PCR (FLU A&B, COVID) ARPGX2  MAGNESIUM   ____________________________________________  12 Lead EKG  Sinus rhythm, rate of 78 bpm.  Normal axis intervals.  Submillimeter ST depressions laterally without reciprocal changes.  No further evidence of acute ischemia. ____________________________________________  RADIOLOGY  ED MD interpretation: 1 view CXR with mild pulmonary vascular congestion, reviewed by me  Official radiology report(s): DG Chest Portable 1 View  Result Date: 10/08/2020 CLINICAL DATA:  COPD EXAM: PORTABLE CHEST 1  VIEW COMPARISON:  August 19, 2020 FINDINGS: The heart size and mediastinal contours are within normal limits. Aortic knob calcifications are seen. Increased prominence of the central pulmonary vasculature is seen. The visualized skeletal structures are unremarkable. IMPRESSION: Mild pulmonary vascular congestion. Electronically Signed   By: Jonna Clark M.D.   On: 10/08/2020 19:31    ____________________________________________   PROCEDURES and INTERVENTIONS  Procedure(s) performed (including Critical Care):  .1-3 Lead EKG Interpretation Performed by: Delton Prairie, MD Authorized by: Delton Prairie, MD     Interpretation: normal     ECG rate:  75   ECG rate assessment: normal     Rhythm: sinus rhythm     Ectopy: none     Conduction: normal      Medications  albuterol (PROVENTIL) (2.5 MG/3ML) 0.083% nebulizer solution 5 mg (5 mg Nebulization Given 10/08/20 2111)  ipratropium (ATROVENT)  nebulizer solution 0.5 mg (0.5 mg Nebulization Given 10/08/20 2111)    ____________________________________________   MDM / ED COURSE   86 year old woman with history of COPD presents to the ED short of breath with evidence of a COPD exacerbation requiring observation medical admission.  Sats in the high 80s/low 90s necessitating nasal cannula, otherwise normal vitals.  Exam with poor air movement on auscultation with diffuse expiratory wheezes consistent with COPD exacerbation.  She is tachypneic and slightly dyspneic, but is able to speak in full sentences.  No further evidence of acute pathology, traumatic pathology or neurovascular deficits.  Blood work is slight elevation of BNP with CXR also demonstrating mild evidence of contributing diastolic heart failure to her shortness of breath.  Patient was not obviously volume overload on my examination, she may have worsening diastolic heart failure and may require diuresis for possibly mixed COPD and CHF exacerbations.  Patient with some improving symptoms after breathing treatments, but still reports shortness of breath and has expiratory wheezes.  Will admit to hospitalist medicine for further work-up and management.   Clinical Course as of 10/08/20 2234  Thu Oct 08, 2020  2222 Reassessed.  Patient reports feeling a little bit better.  She is still wheezing throughout.  We discussed observation admission.  She is agreeable.  Daughter now at the bedside provides additional history and indicates that she was complaining of cough and shortness of breath yesterday as well. [DS]    Clinical Course User Index [DS] Delton Prairie, MD    ____________________________________________   FINAL CLINICAL IMPRESSION(S) / ED DIAGNOSES  Final diagnoses:  COPD exacerbation (HCC)  SOB (shortness of breath)     ED Discharge Orders    None       Berneice Zettlemoyer   Note:  This document was prepared using Dragon voice recognition software and may include  unintentional dictation errors.   Delton Prairie, MD 10/08/20 2237

## 2020-10-08 NOTE — Telephone Encounter (Signed)
Called and spoke to [name]. She states that she is having a cough with SOB. It has started yesterday evening and has extended through the morning and has gotten worse. She does not have a fever and her temperature read at 97.7 when she last checked it. She states that she is usually prescribed prednisone for similar symptoms. Ok to schedule for a held spot on 10/09/20?

## 2020-10-09 ENCOUNTER — Telehealth: Payer: Medicare Other | Admitting: Internal Medicine

## 2020-10-09 LAB — BASIC METABOLIC PANEL
Anion gap: 13 (ref 5–15)
BUN: 22 mg/dL (ref 8–23)
CO2: 21 mmol/L — ABNORMAL LOW (ref 22–32)
Calcium: 9.2 mg/dL (ref 8.9–10.3)
Chloride: 105 mmol/L (ref 98–111)
Creatinine, Ser: 1.04 mg/dL — ABNORMAL HIGH (ref 0.44–1.00)
GFR, Estimated: 53 mL/min — ABNORMAL LOW (ref >60)
Glucose, Bld: 348 mg/dL — ABNORMAL HIGH (ref 70–99)
Potassium: 4.1 mmol/L (ref 3.5–5.1)
Sodium: 139 mmol/L (ref 135–145)

## 2020-10-09 LAB — CREATININE, SERUM
Creatinine, Ser: 0.83 mg/dL (ref 0.44–1.00)
GFR, Estimated: >60 mL/min (ref >60)

## 2020-10-09 LAB — HEMOGLOBIN A1C
Hgb A1c MFr Bld: 6.3 % — ABNORMAL HIGH (ref 4.8–5.6)
Mean Plasma Glucose: 134.11 mg/dL

## 2020-10-09 LAB — CBC
HCT: 41.1 % (ref 36.0–46.0)
Hemoglobin: 13.1 g/dL (ref 12.0–15.0)
MCH: 28.7 pg (ref 26.0–34.0)
MCHC: 31.9 g/dL (ref 30.0–36.0)
MCV: 89.9 fL (ref 80.0–100.0)
Platelets: 251 10*3/uL (ref 150–400)
RBC: 4.57 MIL/uL (ref 3.87–5.11)
RDW: 14.1 % (ref 11.5–15.5)
WBC: 11.7 10*3/uL — ABNORMAL HIGH (ref 4.0–10.5)
nRBC: 0 % (ref 0.0–0.2)

## 2020-10-09 LAB — HIV ANTIBODY (ROUTINE TESTING W REFLEX): HIV Screen 4th Generation wRfx: NONREACTIVE

## 2020-10-09 LAB — TROPONIN I (HIGH SENSITIVITY): Troponin I (High Sensitivity): 40 ng/L — ABNORMAL HIGH (ref <18)

## 2020-10-09 LAB — GLUCOSE, CAPILLARY
Glucose-Capillary: 195 mg/dL — ABNORMAL HIGH (ref 70–99)
Glucose-Capillary: 153 mg/dL — ABNORMAL HIGH (ref 70–99)

## 2020-10-09 NOTE — Evaluation (Signed)
Occupational Therapy Evaluation Patient Details Name: Tanya Cole MRN: [id number] DOB: Nov 13, 1932 Today's Date: 10/09/2020    History of Present Illness Tanya Cole is a 86 y.o. female with medical history significant for CAD, with recent cath 10/21 showing severe single-vessel disease with mild in-stent restenosis, HTN, DM, COPD, diastolic heart failure, last EF 60 to 65% on 10/19, with history of recent hospitalization in October for chest pain who presents to the emergency room by EMS with a complaint of wheezing.   Clinical Impression   [name] was seen for OT evaluation this date. Prior to hospital admission, pt was Independent for mobility and ADLs, takes sponge baths and has assist from family for medication mgmt. Pt lives alone in 3STE house c several family members nearby who check daily on her. Pt presents to acute OT demonstrating near baseline mobility and ADL performance. SUPERVISION only for simulated BSC t/f and LB access in sitting. Pt educated in energy conservation strategies including pursed lip breathing, activity pacing, home/routines modifications, work simplification, and falls prevention. Pt reports feeling at functional baseline for ADLs. No skilled OT needs identified. Will sign off. Please re-consult if additional OT needs arise.     Follow Up Recommendations  No OT follow up;Supervision - Intermittent    Equipment Recommendations  None recommended by OT    Recommendations for Other Services       Precautions / Restrictions Precautions Precautions: Fall Precaution Comments: monitor HR Restrictions Weight Bearing Restrictions: No      Mobility Bed Mobility Overal bed mobility: Modified Independent       Transfers Overall transfer level: Needs assistance Equipment used: None Transfers: Sit to/from Stand Sit to Stand: Supervision       Balance Overall balance assessment: Mild deficits observed, not formally tested        ADL  either performed or assessed with clinical judgement   ADL Overall ADL's : Needs assistance/impaired     General ADL Comments: SUPERVISION only for simulated BSC t/f.                  Pertinent Vitals/Pain Pain Assessment: No/denies pain     Hand Dominance Right   Extremity/Trunk Assessment Upper Extremity Assessment Upper Extremity Assessment: Generalized weakness   Lower Extremity Assessment Lower Extremity Assessment: Generalized weakness       Communication Communication Communication: No difficulties   Cognition Arousal/Alertness: Awake/alert Behavior During Therapy: WFL for tasks assessed/performed Overall Cognitive Status: Within Functional Limits for tasks assessed       General Comments  SpO2 96% on 1.5L [place]    Exercises Exercises: Other exercises Other Exercises Other Exercises: Pt educated re: OT role, DME recs, d/c recs, falls prevention, ECS Other Exercises: LBD, UBD, sup<>sit, sit<>stand, side steps at EOB, sitting/standing balance/tolerance   Shoulder Instructions      Home Living Family/patient expects to be discharged to:: Private residence Living Arrangements: Alone Available Help at Discharge: Family;Available PRN/intermittently (family nearby, checks in frequently) Type of Home: House Home Access: Stairs to enter [employer] of Steps: 3 Entrance Stairs-Rails: None Home Layout: One level     Bathroom Shower/Tub: Chief Strategy Officer: Standard     Home Equipment: Environmental consultant - 2 wheels;Cane - single point          Prior Functioning/Environment Level of Independence: Needs assistance  Gait / Transfers Assistance Needed: pt ambulates without AD, denies falls in past 29mo ADL's / Homemaking Assistance Needed: Pt indep with basic ADL, sponge baths,  meal prep, enjoys reading and puzzles; dtr endorses pt sometimes needs "reminders" for taking her medication, family provide transportation            OT Problem  List: Decreased activity tolerance;Cardiopulmonary status limiting activity      OT Treatment/Interventions:      OT Goals(Current goals can be found in the care plan section) Acute Rehab OT Goals Patient Stated Goal: go home OT Goal Formulation: With patient Time For Goal Achievement: 10/23/20 Potential to Achieve Goals: Good  OT Frequency:      AM-PAC OT "6 Clicks" Daily Activity     Outcome Measure Help from another person eating meals?: None Help from another person taking care of personal grooming?: None Help from another person toileting, which includes using toliet, bedpan, or urinal?: None Help from another person bathing (including washing, rinsing, drying)?: A Little Help from another person to put on and taking off regular upper body clothing?: None Help from another person to put on and taking off regular lower body clothing?: None 6 Click Score: 23   End of Session Equipment Utilized During Treatment: Oxygen Nurse Communication: Other (comment) ( urine output)  Activity Tolerance: Patient tolerated treatment well Patient left: in bed;with call bell/phone within reach  OT Visit Diagnosis: Other abnormalities of gait and mobility (R26.89)                Time: 1443-1540 OT Time Calculation (min): 10 min Charges:  OT General Charges $OT Visit: 1 Visit OT Evaluation $OT Eval Low Complexity: 1 Low OT Treatments $Self Care/Home Management : 8-22 mins  Kathie Dike, M.S. OTR/L  10/09/20, 10:38 AM  ascom [phone]

## 2020-10-09 NOTE — ED Notes (Signed)
Patient utilized bedpan to void.

## 2020-10-09 NOTE — Consult Note (Signed)
   Heart Failure Nurse Navigator Note  HFpEf 60-65% Grade I Diastolic dysfunction.  Presented to the emergency room with a 2 to 3-day history of shortness of breath, described it is felt like she was being smothered, orthopnea and lower extremity edema.  States as outpatient her diuretic that was usually taken 2-3 times a week was changed to every other day.  Chest x-ray revealed small bilateral pleural effusions  Comorbidities:  Chronic kidney disease stage III with baseline creatinine 1.8-2 Anemia Asthma COPD  Medications:  Atorvastatin 80 mg daily Lovenox injection 40 mg subcu daily Lasix 20 mg IV every 12 hours Isosorbide mononitrate 30 mg daily Lisinopril 20 mg daily Metoprolol succinate 50mg  daily   Labs:  Sodium 144 potassium 5, chloride 108, CO2 31, BUN 64, creatinine 1.94, hemoglobin 7.6, hematocrit 25.4, BUN 617 , magnesium 2.3 and albumin 2.7.     Assessment:   General -she is awake and alert sitting up at the bedside.  In no acute distress.  HEENT-pupils are equal, she wears glasses, normocephalic.  Cardiac-heart tones of regular rate and rhythm no rubs or murmurs appreciated.  Chest-breath sounds are clear to posterior auscultation no wheezes or rhonchi appreciated.  Abdomen- soft non tender  Musculoskeletal-no lower extremity edema noted.  Psych is pleasant and appropriate.  Neurologic-speech is clear she moves all extremities without difficulty.     Meeting with patient and daughter in the emergency room.  Discussed how to take care of herself when she goes home.  Discussed weighing daily recording and reporting any significant changes in weight.  Also discussed low-sodium diet and fluid restriction.  Patient lives by herself but children drop in frequently to check on her and fix her meals.  They also help with cleaning the home.   Discussed with weighing daily and obtaining a scale.   She was given the heart failure teaching booklet,  those own magnet and the handout for surviving the holidays.  She was also given of appointment with and the heart failure heart failure clinic for next week.  Clarisa Kindred RN, CHFN

## 2020-10-09 NOTE — Progress Notes (Signed)
PROGRESS NOTE  Sanborn Hurlbutt DDU:[id number] DOB: April 10, 1933 DOA: 10/08/2020 PCP: Dale [place], MD  Brief History   Tanya Cole is a 86 y.o. female with medical history significant for CAD, with recent cath 10/21 showing severe single-vessel disease with mild in-stent restenosis, HTN, DM, COPD, diastolic heart failure, last EF 60 to 65% on 10/19, with history of recent hospitalization in October for chest pain who presents to the emergency room by EMS with a complaint of wheezing.  She has a dry cough but no fever or chills and no nausea vomiting or diarrhea.  She received duo nebs and Solu-Medrol in route. ED Course: On arrival she had increased work of breathing.  Afebrile, BP 162/77 heart rate 78, respirations 26 with O2 sat 92% on room air.  Blood work significant for WBC of 12,000, troponin 35 and BNP 238.  Electrolytes mostly within normal limits.  Covid and flu test negative EKG as reviewed by me : Normal sinus rhythm rate is 78 with no acute ST-T wave changes Chest x-ray shows mild pulmonary vascular edema Patient was treated with additional DuoNeb's in the ER.  Hospitalist consulted for admission.  The patient has been admitted to a telemetry bed. She was made DNR at her request. She is receiving IV steroids and nebulizer treatments.  Consultants  . None  Procedures  . None  Antibiotics   Anti-infectives (From admission, onward)   None    .  Subjective  The patient is resting comfortably. No new complaints. She states that she is feeling better.  Objective   Vitals:  Vitals:   10/09/20 1500 10/09/20 1530  BP: 120/62 113/65  Pulse: 72 78  Resp: (!) 24 (!) 21  Temp:    SpO2: 96% 95%   Exam:  Constitutional:  . The patient is awake, alert, and oriented x 3. No acute distress. Respiratory:  . No increased work of breathing. . No wheezes, rales, or rhonchi . No tactile fremitus Cardiovascular:  . Regular rate and rhythm . No murmurs, ectopy, or  gallups. . No lateral PMI. No thrills. Abdomen:  . Abdomen is soft, non-tender, non-distended . No hernias, masses, or organomegaly . Normoactive bowel sounds.  Musculoskeletal:  . No cyanosis, clubbing, or edema Skin:  . No rashes, lesions, ulcers . palpation of skin: no induration or nodules Neurologic:  . CN 2-12 intact . Sensation all 4 extremities intact Psychiatric:  . Mental status o Mood, affect appropriate o Orientation to person, place, time  . judgment and insight appear intact  I have personally reviewed the following:   Today's Data  . Vitals, CBC, BMP  Micro Data  . None  Imaging  . CXR  Scheduled Meds: . atorvastatin  80 mg Oral q1800  . enoxaparin (LOVENOX) injection  40 mg Subcutaneous Q24H  . furosemide  20 mg Intravenous Q12H  . insulin aspart  0-5 Units Subcutaneous QHS  . insulin aspart  0-9 Units Subcutaneous TID WC  . ipratropium-albuterol  3 mL Nebulization Q6H  . isosorbide mononitrate  30 mg Oral Daily  . lisinopril  20 mg Oral Daily  . methylPREDNISolone (SOLU-MEDROL) injection  40 mg Intravenous Q6H   Followed by  . [START ON 10/10/2020] predniSONE  40 mg Oral Q breakfast  . metoprolol succinate  50 mg Oral Daily  . sodium chloride flush  3 mL Intravenous Q12H   Continuous Infusions: . sodium chloride      Principal Problem:   COPD with acute exacerbation (HCC) Active  Problems:   Hypertension   Diabetes mellitus with cardiac complication (HCC)   Coronary artery disease   Elevated troponin   Acute on chronic heart failure with preserved ejection fraction (HFpEF) (HCC)   LOS: 1 day   A & P    COPD with acute exacerbation (HCC): The patient is receiving IV steroids and nebulizer treatment. She is currently saturating in the 90's on 2 liters. Flutter valve ordered for effective cough. Will taper steroids.  Acute on chronic heart failure with preserved ejection fraction (HFpEF) (HCC): The patient is receiving low dose lasix to  address interstitial edema on chest x-ray. Monitor creatinine and volume status. She is being continued on home doses of toprol and lisinopril. Strict I's and O's will be followed. Last EF 10/21 showing grade 2 diastolic dysfunction EF 60 to 70%.  Coronary artery disease with history of STEMI/ Elevated troponin: Troponins elevated. Likely due to demand supply mismatch in the setting of respiratory distress. The patient had a cardiac cath 10/21 that showed severe single-vessel disease being treated medically. Continue Imdur, metoprolol, atorvastatin as at home. Patient denies chest pain.   Hypertension: Blood pressures are within normal limits on home doses of lisinopril and metoprolol.  Diabetes mellitus with cardiac complication [facility]): The patient's glucoses have run from 146 to 305. At home the patient's glucoses are controlled with Glucophage alone. Hemoglobin A1c is 6.3. Elevated glucoses are due to steroids. Steroids will be tapered.  I will add low dose lantus to SSI for improved control in the meantime.  I have seen and examined this patient myself. I have spent 34 minutes in her evaluation and care.   DVT prophylaxis: Lovenox  Code Status: DNR  Family Communication:  none  Disposition Plan: Back to previous home environment  Korrin Waterfield, DO [facility] Direct contact: see [email or web address]  7PM-7AM contact night coverage as above 10/09/2020, 4:46 PM  LOS: 1 day

## 2020-10-09 NOTE — TOC Initial Note (Signed)
Transition of Care [facility]) - Initial/Assessment Note    Patient Details  Name: Tanya Cole MRN: [id number] Date of Birth: 01-Sep-1933  Transition of [facility]) CM/SW Contact:    Marina Goodell Phone Number: [phone] 10/09/2020, 3:59 PM  Clinical Narrative:                  Patient presents to ED sue to SOB.  CSW spoke with patient and her johnson,jennifer (Daughter)  [phone].  CSW explained TOC role in patient care. Patient lives at home and is able to complete all her ADLs.  Patient's grand-daughter or daughter take her to doctor's appointments and help with grocery shopping.  Patient is able to receive and take her medications without issues. Patient is able to clean, cook and do laundry on her own.  Patient has strong family supports and lives near her grand-daughter and brother. PT recommended home health and patient is willing to return home health.  Patient did not have a home health agency preference.  CSW reached out to [facility] for confirmation if they could take the patient.  Expected Discharge Plan: Home w Home Health Services Barriers to Discharge: Continued Medical Work up   Patient Goals and CMS Choice Patient states their goals for this hospitalization and ongoing recovery are:: I want to go back home.      Expected Discharge Plan and Services Expected Discharge Plan: Home w Home Health Services In-house Referral: Clinical Social Work   Post Acute Care Choice: Home Health Living arrangements for the past 2 months: Single Family Home                                      Prior Living Arrangements/Services Living arrangements for the past 2 months: Single Family Home Lives with:: Self Patient language and need for interpreter reviewed:: Yes Do you feel safe going back to the place where you live?: Yes      Need for Family Participation in Patient Care: Yes (Comment) Care giver support system in place?: Yes (comment)    Criminal Activity/Legal Involvement Pertinent to Current Situation/Hospitalization: No - Comment as needed  Activities of Daily Living      Permission Sought/Granted Permission sought to share information with : Family Supports    Share Information with NAME: johnson,jennifer (Daughter)   [phone]           Emotional Assessment Appearance:: Appears stated age Attitude/Demeanor/Rapport: Engaged Affect (typically observed): Stable Orientation: : Oriented to Self,Oriented to Place,Oriented to  Time,Oriented to Situation Alcohol / Substance Use: Not Applicable Psych Involvement: No (comment)  Admission diagnosis:  COPD with acute exacerbation (HCC) [J44.1] Patient Active Problem List   Diagnosis Date Noted  . Type 2 diabetes mellitus without complication (HCC) 10/08/2020  . Elevated troponin 10/08/2020  . Acute on chronic heart failure with preserved ejection fraction (HFpEF) (HCC) 10/08/2020  . COPD with acute exacerbation (HCC) 08/19/2020  . History of non-ST elevation myocardial infarction (NSTEMI) 08/07/2020  . Asthma 08/07/2020  . Leukocytosis 08/07/2020  . Left shoulder pain 07/12/2020  . Iron deficiency 04/16/2020  . Anemia 04/04/2020  . SOB (shortness of breath) 03/25/2020  . Cough 09/30/2019  . Gallstone pancreatitis   . Pre-op evaluation 08/30/2019  . Cholecystitis 08/05/2019  . Choledocholithiasis   . Respiratory illness 06/18/2019  . Coronary artery disease 12/29/2018  . Chest pain 10/07/2018  . Memory change 05/27/2018  .  Osteoporosis 02/05/2018  . Weight loss 06/24/2017  . Abdominal pain, left lower quadrant 10/05/2016  . Long term current use of systemic steroids 05/16/2016  . Elevated erythrocyte sedimentation rate 05/04/2016  . Back pain 04/29/2016  . Fatigue 05/24/2015  . Health care maintenance 01/25/2015  . UTI (urinary tract infection) 07/07/2014  . Neuropathy 03/24/2014  . Diverticulitis 02/24/2013  . Reactive airway disease 09/15/2012   . Hypertension 09/14/2012  . Hypercholesterolemia 09/14/2012  . Diabetes mellitus with cardiac complication (HCC) 09/14/2012   PCP:  Dale [place], MD Pharmacy:   [facility] [address] ([address]), [place] - [address] [place]) [place] [address] Phone: [phone] Fax: [phone]     Social Determinants of Health (SDOH) Interventions    Readmission Risk Interventions No flowsheet data found.

## 2020-10-09 NOTE — Evaluation (Signed)
Physical Therapy Evaluation Patient Details Name: Tanya Cole MRN: [id number] DOB: Dec 16, 1932 Today's Date: 10/09/2020   History of Present Illness  86 y.o. female with medical history significant for diabetes, CAD, NSTEMI 12/19, DES to LAD in December 2019, HTN, HLD, recently hospitalized from 10/8-10/11 with chest pain, undergoing cardiac cath which showed stable 60% mid LAD stenosis and a 90% stenosis in the distal LAD close to apex not amenable to intervention with recommendation for maximization of medical therapy, who presents with recurrent chest pain associated with shortness of breath.  She states the chest pain woke her from sleep and it radiates from the right side of her chest to the middle and left side of her chest.  It is of moderate intensity with no aggravating or alleviating factors.  She denies nausea vomiting or diaphoresis, lightheadedness or palpitations.  Denies fever or chills.  Denies nausea vomiting or diarrhea.  Has some wheezing and a mild cough. CTA chest negative for PE.  Clinical Impression  Pt admitted with above diagnosis. Pt currently with functional limitations due to the deficits listed below (see "PT Problem List"). Upon entry, pt in bed, awake and agreeable to participate. The pt is alert and oriented x4, pleasant, conversational, and generally a good historian. ModI performance of bed mobility, transfers, and AMB. Pt initially a bit unsteady when first up, but this improved with AMB. Pt moved to room air, sats WNL during gait, left on room air at exit. Functional mobility assessment demonstrates increased effort/time requirements, good tolerance, and need for physical assistance, whereas the patient performed these at a higher level of independence PTA.  Pt will benefit from skilled PT intervention to increase independence and safety with basic mobility in preparation for discharge to the venue listed below.       Follow Up Recommendations Home health  PT;Supervision - Intermittent    Equipment Recommendations  None recommended by PT    Recommendations for Other Services       Precautions / Restrictions Precautions Precautions: Fall Precaution Comments: monitor HR Restrictions Weight Bearing Restrictions: No      Mobility  Bed Mobility Overal bed mobility: Modified Independent Bed Mobility: Supine to Sit;Sit to Supine     Supine to sit: Modified independent (Device/Increase time) Sit to supine: Modified independent (Device/Increase time)        Transfers Overall transfer level: Needs assistance   Transfers: Sit to/from Stand Sit to Stand: Supervision         General transfer comment: mild LOB inititally, then fairly steady  Ambulation/Gait Ambulation/Gait assistance: Min guard;Supervision Gait Distance (Feet): 100 Feet Assistive device: None          Stairs            Wheelchair Mobility    Modified Rankin (Stroke Patients Only)       Balance Overall balance assessment: Mild deficits observed, not formally tested;Modified Independent                                           Pertinent Vitals/Pain Pain Assessment: No/denies pain    Home Living Family/patient expects to be discharged to:: Private residence Living Arrangements: Alone Available Help at Discharge: Family;Available PRN/intermittently Type of Home: House Home Access: Stairs to enter Entrance Stairs-Rails: None Entrance Stairs-Number of Steps: 3 Home Layout: One level Home Equipment: Walker - 2 wheels;Cane - single point  Prior Function Level of Independence: Needs assistance   Gait / Transfers Assistance Needed: pt ambulates without AD, denies falls in past 10mo  ADL's / Homemaking Assistance Needed: Pt indep with basic ADL, sponge baths, meal prep, enjoys reading and puzzles; dtr endorses pt sometimes needs "reminders" for taking her medication, family provide transportation        Hand  Dominance   Dominant Hand: Right    Extremity/Trunk Assessment   Upper Extremity Assessment Upper Extremity Assessment: Overall WFL for tasks assessed    Lower Extremity Assessment Lower Extremity Assessment: Overall WFL for tasks assessed    Cervical / Trunk Assessment Cervical / Trunk Assessment: Normal  Communication   Communication: No difficulties  Cognition Arousal/Alertness: Awake/alert Behavior During Therapy: WFL for tasks assessed/performed Overall Cognitive Status: Within Functional Limits for tasks assessed                                        General Comments      Exercises     Assessment/Plan    PT Assessment Patient needs continued PT services  PT Problem List Decreased activity tolerance;Cardiopulmonary status limiting activity;Decreased strength;Decreased balance       PT Treatment Interventions Gait training;Stair training;Functional mobility training;Therapeutic activities;Balance training;Therapeutic exercise    PT Goals (Current goals can be found in the Care Plan section)  Acute Rehab PT Goals Patient Stated Goal: return to home PT Goal Formulation: With patient Time For Goal Achievement: 10/23/20 Potential to Achieve Goals: Good    Frequency Min 2X/week   Barriers to discharge        Co-evaluation               AM-PAC PT "6 Clicks" Mobility  Outcome Measure Help needed turning from your back to your side while in a flat bed without using bedrails?: A Little Help needed moving from lying on your back to sitting on the side of a flat bed without using bedrails?: A Little Help needed moving to and from a bed to a chair (including a wheelchair)?: A Little Help needed standing up from a chair using your arms (e.g., wheelchair or bedside chair)?: A Little Help needed to walk in hospital room?: None Help needed climbing 3-5 steps with a railing? : None 6 Click Score: 20    End of Session   Activity Tolerance:  Patient tolerated treatment well;No increased pain Patient left: in bed;with family/visitor present;with call bell/phone within reach;with nursing/sitter in room Nurse Communication: Mobility status PT Visit Diagnosis: Muscle weakness (generalized) (M62.81);Difficulty in walking, not elsewhere classified (R26.2);Unsteadiness on feet (R26.81)    Time: 0254-2706 PT Time Calculation (min) (ACUTE ONLY): 15 min   Charges:   PT Evaluation $PT Eval Low Complexity: 1 Low          3:39 PM, 10/09/20 Rosamaria Lints, PT, DPT Physical Therapist - [facility]  [phone] (ASCOM)    Tanya Cole 10/09/2020, 3:36 PM

## 2020-10-09 NOTE — ED Notes (Signed)
Pt utilized bedpan voiding 100 mL urine. Patient changed into hospital gown and positioned in bed for comfort. 2 warm blankets applied. Bed low and locked with side rails raised x2 and call bell in reach. Cardiac monitor remains in place. Patient provided with snack and ice water per request and with MD approval. RN to continue to monitor.

## 2020-10-09 NOTE — Progress Notes (Signed)
Patient admitted to unit at this time via wheelchair by ED personnel. Upon initial assessment patient A/Ox4 and able to verbalize needs. Patient  on RA vitals WNL. No CP or SOB noted. FSBS WNL. Purewick in place and patient SR on tele verified with monitor tech. Patient oriented to new room and accessories. Call bell and room phone within reach. Daughter made aware of new room and updated on care. Will continue to monitor.

## 2020-10-09 NOTE — Progress Notes (Signed)
Inpatient Diabetes Program Recommendations  AACE/ADA: New Consensus Statement on Inpatient Glycemic Control (2015)  Target Ranges:  Prepandial:   less than 140 mg/dL      Peak postprandial:   less than 180 mg/dL (1-2 hours)      Critically ill patients:  140 - 180 mg/dL   Results for Tanya Cole, Tanya Cole (MRN [id number]) as of 10/09/2020 12:21  Ref. Range 10/09/2020 04:02  Glucose Latest Ref Range: 70 - 99 mg/dL 630 (H)    Admit with: SOB/ COPD  History: DM, COPD  Home DM Meds: Metformin 500 mg BID  Current Orders: Novolog Sensitive Correction Scale/ SSI (0-9 units) TID AC + HS    Note Solumedrol to stop later today--last dose due 6pm tonight  Will start Prednisone 40 mg Daily tomorrow AM    MD- While patient getting Steroids, please consider increasing the Novolog to the 0-15 unit scale  May also consider adding low dose basal insulin while inpatient if CBGs remain >200 (Lantus 5 units Daily [0.1 units/kg])    --Will follow patient during hospitalization--  Ambrose Finland RN, MSN, CDE Diabetes Coordinator Inpatient Glycemic Control Team Team Pager: [phone] (8a-5p)

## 2020-10-09 NOTE — ED Notes (Signed)
MD Para March at pt bedside

## 2020-10-10 LAB — BASIC METABOLIC PANEL
Anion gap: 13 (ref 5–15)
BUN: 42 mg/dL — ABNORMAL HIGH (ref 8–23)
CO2: 24 mmol/L (ref 22–32)
Calcium: 9.4 mg/dL (ref 8.9–10.3)
Chloride: 101 mmol/L (ref 98–111)
Creatinine, Ser: 0.92 mg/dL (ref 0.44–1.00)
GFR, Estimated: >60 mL/min (ref >60)
Glucose, Bld: 211 mg/dL — ABNORMAL HIGH (ref 70–99)
Potassium: 3.9 mmol/L (ref 3.5–5.1)
Sodium: 138 mmol/L (ref 135–145)

## 2020-10-10 LAB — GLUCOSE, CAPILLARY
Glucose-Capillary: 266 mg/dL — ABNORMAL HIGH (ref 70–99)
Glucose-Capillary: 258 mg/dL — ABNORMAL HIGH (ref 70–99)
Glucose-Capillary: 251 mg/dL — ABNORMAL HIGH (ref 70–99)
Glucose-Capillary: 156 mg/dL — ABNORMAL HIGH (ref 70–99)

## 2020-10-10 NOTE — Plan of Care (Signed)
  Problem: Education: Goal: Knowledge of General Education information will improve Description: Including pain rating scale, medication(s)/side effects and non-pharmacologic comfort measures Outcome: Progressing   Problem: Clinical Measurements: Goal: Respiratory complications will improve Outcome: Progressing   Problem: Activity: Goal: Risk for activity intolerance will decrease Outcome: Progressing   

## 2020-10-10 NOTE — Progress Notes (Signed)
Brief Nutrition Note  RD consulted for assessment of nutritional status.  ASSESSMENT:  86 year old female with history significant for CAD with recent cath (10/21) showing severe single-vessel disease with mild in-stent restenosis, HTN, DM2, COPD, dCHF, last EF 60-65% (10/19), and recent hospitalization in October for chest pain presents with dry cough and wheezing. Patient treated with additional DuoNeb's in ED and admitted for acute exacerbation of COPD.  RD working remotely.  RD spoke with pt via phone this morning, reports doing well today. She endorses eating all of her breakfast with 100% po documented in flowsheet. Patient endorses eating well at baseline, states her appetite "might be a little too good" and laughs. Says she has been cooking all her life and continues to prepare meals at home. Recalls a bowl of cereal with coffee for breakfast, salad, sandwich, soups, or leftovers for lunch, and cooks a variety of dinners. Patient abruptly ended conversation stated "my lunch is here" and hung up.   Wt Readings from Last 10 Encounters:  10/10/20 52.8 kg  09/15/20 53.5 kg  08/27/20 52.2 kg  08/19/20 52.2 kg  08/17/20 52.2 kg  08/10/20 52.5 kg  07/20/20 53.5 kg  07/03/20 52.6 kg  06/08/20 52.3 kg  05/28/20 53.3 kg    Diet Order:   Diet Order            Diet heart healthy/carb modified Room service appropriate? Yes; Fluid consistency: Thin  Diet effective now                Labs and medications reviewed.   No nutrition interventions warranted at this time, if nutrition issues arise please re-consult.  Lars Masson, RD, LDN Clinical Nutrition After Hours/Weekend Pager # in Amion

## 2020-10-10 NOTE — Progress Notes (Signed)
PROGRESS NOTE  [place] Arakelian ZCH:[id number] DOB: [birth year] DOA: 10/08/2020 PCP: Dale [place], MD  Brief History   Rollen Sox Cannan is a 86 y.o. female with medical history significant for CAD, with recent cath 10/21 showing severe single-vessel disease with mild in-stent restenosis, HTN, DM, COPD, diastolic heart failure, last EF 60 to 65% on 10/19, with history of recent hospitalization in October for chest pain who presents to the emergency room by EMS with a complaint of wheezing.  She has a dry cough but no fever or chills and no nausea vomiting or diarrhea.  She received duo nebs and Solu-Medrol in route. ED Course: On arrival she had increased work of breathing.  Afebrile, BP 162/77 heart rate 78, respirations 26 with O2 sat 92% on room air.  Blood work significant for WBC of 12,000, troponin 35 and BNP 238.  Electrolytes mostly within normal limits.  Covid and flu test negative EKG as reviewed by me : Normal sinus rhythm rate is 78 with no acute ST-T wave changes Chest x-ray shows mild pulmonary vascular edema Patient was treated with additional DuoNeb's in the ER.  Hospitalist consulted for admission.  The patient has been admitted to a telemetry bed. She was made DNR at her request. She is receiving IV steroids and nebulizer treatments.  Consultants  . None  Procedures  . None  Antibiotics   Anti-infectives (From admission, onward)   None     Subjective  The patient is resting comfortably. No new complaints. She states that she is feeling better.  Objective   Vitals:  Vitals:   10/10/20 0835 10/10/20 1259  BP: (!) 104/52 108/65  Pulse: 99 85  Resp: 16 16  Temp: 98.2 F (36.8 C) 98.6 F (37 C)  SpO2: 94% 97%   Exam:  Constitutional:  . The patient is awake, alert, and oriented x 3. No acute distress. Respiratory:  . No increased work of breathing. . No wheezes, rales, or rhonchi . No tactile fremitus Cardiovascular:  . Regular rate and rhythm . No  murmurs, ectopy, or gallups. . No lateral PMI. No thrills. Abdomen:  . Abdomen is soft, non-tender, non-distended . No hernias, masses, or organomegaly . Normoactive bowel sounds.  Musculoskeletal:  . No cyanosis, clubbing, or edema Skin:  . No rashes, lesions, ulcers . palpation of skin: no induration or nodules Neurologic:  . CN 2-12 intact . Sensation all 4 extremities intact Psychiatric:  . Mental status o Mood, affect appropriate o Orientation to person, place, time  . judgment and insight appear intact  I have personally reviewed the following:   Today's Data  . Vitals, BMP, Glucoses  Micro Data  . None  Imaging  . CXR  Scheduled Meds: . atorvastatin  80 mg Oral q1800  . enoxaparin (LOVENOX) injection  40 mg Subcutaneous Q24H  . insulin aspart  0-5 Units Subcutaneous QHS  . insulin aspart  0-9 Units Subcutaneous TID WC  . ipratropium-albuterol  3 mL Nebulization Q6H  . isosorbide mononitrate  30 mg Oral Daily  . lisinopril  20 mg Oral Daily  . metoprolol succinate  50 mg Oral Daily  . predniSONE  40 mg Oral Q breakfast  . sodium chloride flush  3 mL Intravenous Q12H   Continuous Infusions: . sodium chloride      Principal Problem:   COPD with acute exacerbation (HCC) Active Problems:   Hypertension   Diabetes mellitus with cardiac complication (HCC)   Coronary artery disease   Elevated troponin  Acute on chronic heart failure with preserved ejection fraction (HFpEF) (HCC)   LOS: 2 days   A & P    COPD with acute exacerbation (HCC): The patient is receiving IV steroids and nebulizer treatment. She is currently saturating in the 90's on 2 liters. Flutter valve ordered for effective cough. Will taper steroids.  Acute on chronic heart failure with preserved ejection fraction (HFpEF) (HCC): The patient is receiving low dose lasix to address interstitial edema on chest x-ray. Monitor creatinine and volume status. She is being continued on home doses of  toprol and lisinopril. Strict I's and O's will be followed. Last EF 10/21 showing grade 2 diastolic dysfunction EF 60 to 32%. Lasix stopped this morning due to hypotension and sinus tachycardia. Will restart lasix 20 mg daily by mouth.   Coronary artery disease with history of STEMI/ Elevated troponin: Troponins elevated. Likely due to demand supply mismatch in the setting of respiratory distress. The patient had a cardiac cath 10/21 that showed severe single-vessel disease being treated medically. Continue Imdur, metoprolol, atorvastatin as at home. Patient denies chest pain.   Hypertension: Blood pressures are within normal limits on home doses of lisinopril and metoprolol.  Diabetes mellitus with cardiac complication [facility]): The patient's glucoses have run from 146 to 305. At home the patient's glucoses are controlled with Glucophage alone. Hemoglobin A1c is 6.3. Elevated glucoses are due to steroids. Steroids will be tapered.  I will add low dose lantus to SSI for improved control in the meantime.  I have seen and examined this patient myself. I have spent 34 minutes in her evaluation and care.   DVT prophylaxis: Lovenox  Code Status: DNR  Family Communication:  none  Disposition Plan:  Status is: Inpatient  Remains inpatient appropriate because:Inpatient level of care appropriate due to severity of illness   Dispo: The patient is from: Home              Anticipated d/c is to: tbd              Anticipated d/c date is: 1 day              Patient currently is not medically stable to d/c.  Neka Bise, DO [facility] Direct contact: see [email or web address]  7PM-7AM contact night coverage as above 10/10/2020, 3:09 PM  LOS: 1 day

## 2020-10-10 NOTE — Progress Notes (Signed)
ccmd called and reported that pt just have an SVT  HR up to 182 for 14 seconds and went to SR at 76. Pt asymptomatic on assessment. Notify NP Ouma. Will continue to monitor.

## 2020-10-11 LAB — BASIC METABOLIC PANEL
Anion gap: 10 (ref 5–15)
BUN: 49 mg/dL — ABNORMAL HIGH (ref 8–23)
CO2: 23 mmol/L (ref 22–32)
Calcium: 9.1 mg/dL (ref 8.9–10.3)
Chloride: 104 mmol/L (ref 98–111)
Creatinine, Ser: 0.85 mg/dL (ref 0.44–1.00)
GFR, Estimated: >60 mL/min (ref >60)
Glucose, Bld: 152 mg/dL — ABNORMAL HIGH (ref 70–99)
Potassium: 3.7 mmol/L (ref 3.5–5.1)
Sodium: 137 mmol/L (ref 135–145)

## 2020-10-11 LAB — CBC
HCT: 38.5 % (ref 36.0–46.0)
Hemoglobin: 12.6 g/dL (ref 12.0–15.0)
MCH: 28.4 pg (ref 26.0–34.0)
MCHC: 32.7 g/dL (ref 30.0–36.0)
MCV: 86.9 fL (ref 80.0–100.0)
Platelets: 301 10*3/uL (ref 150–400)
RBC: 4.43 MIL/uL (ref 3.87–5.11)
RDW: 14.6 % (ref 11.5–15.5)
WBC: 21.2 10*3/uL — ABNORMAL HIGH (ref 4.0–10.5)
nRBC: 0 % (ref 0.0–0.2)

## 2020-10-11 LAB — GLUCOSE, CAPILLARY
Glucose-Capillary: 129 mg/dL — ABNORMAL HIGH (ref 70–99)
Glucose-Capillary: 166 mg/dL — ABNORMAL HIGH (ref 70–99)
Glucose-Capillary: 238 mg/dL — ABNORMAL HIGH (ref 70–99)

## 2020-10-11 MED ORDER — IPRATROPIUM-ALBUTEROL 0.5-2.5 (3) MG/3ML IN SOLN: 3.0000 mL | Freq: Two times a day (BID) | RESPIRATORY_TRACT | Status: DC

## 2020-10-11 MED ORDER — PREDNISONE 20 MG PO TABS: ORAL_TABLET | ORAL | 0 refills | Status: DC

## 2020-10-11 NOTE — Progress Notes (Signed)
OT Cancellation Note  Patient Details Name: Tanya Cole MRN: [id number] DOB: October 24, 1933   Cancelled Treatment:    Reason Eval/Treat Not Completed: OT screened, no needs identified, will sign off. Duplicate orders received. Pt evaluated by OT this admission and signed off as pt is near functional baseline for ADLs and mobility with strong family support. Please see 12/10 evaluation for recommendations. Thank you.   Kathie Dike, M.S. OTR/L  10/11/20, 11:15 AM  ascom [phone]

## 2020-10-11 NOTE — Progress Notes (Signed)
PT Cancellation Note  Patient Details Name: Tanya Cole MRN: [id number] DOB: August 09, 1933   Cancelled Treatment:    Duplicate orders received and acknowledged. Pt evaluated on Friday 10/09/10 and is currently on caseload. Thank you.  Vira Blanco, PT, DPT 11:18 AM,10/11/20

## 2020-10-11 NOTE — Discharge Summary (Signed)
Physician Discharge Summary  [place] Blakeney EAV:[id number] DOB: December 09, 1932 DOA: 10/08/2020  PCP: Dale Tome, MD  Admit date: 10/08/2020 Discharge date: 10/11/2020  Recommendations for Outpatient Follow-up:  1. Discharge to home. 2. Follow up with PCP in 7-10 days.   Follow-up Information    [facility] Follow up on 10/16/2020.   Specialty: Cardiology Why: at 10:30am. Enter through the Medical Mall entrance Contact information: [address] [place] [address] [phone]             Discharge Diagnoses: Principal diagnosis is #1 1. COPD with exacerbation 2. Acute hypoxic respiratory failure 3. Acute on chronic HFpEF 4. CAD, Hx STEMI 5. Hypertension 6. DM II with cardiac complication  Discharge Condition: Fair  Disposition: Fair  Diet recommendation: Heart healthy/modified carbohydrate  Filed Weights   10/09/20 1744 10/10/20 0353 10/11/20 0354  Weight: 53.2 kg 52.8 kg 52.7 kg    History of present illness: Tanya Cole is a 86 y.o. female with medical history significant for CAD, with recent cath 10/21 showing severe single-vessel disease with mild in-stent restenosis, HTN, DM, COPD, diastolic heart failure, last EF 60 to 65% on 10/19, with history of recent hospitalization in October for chest pain who presents to the emergency room by EMS with a complaint of wheezing.  She has a dry cough but no fever or chills and no nausea vomiting or diarrhea.  She received duo nebs and Solu-Medrol in route. ED Course: On arrival she had increased work of breathing.  Afebrile, BP 162/77 heart rate 78, respirations 26 with O2 sat 92% on room air.  Blood work significant for WBC of 12,000, troponin 35 and BNP 238.  Electrolytes mostly within normal limits.  Covid and flu test negative EKG as reviewed by me : Normal sinus rhythm rate is 78 with no acute ST-T wave changes Chest x-ray shows mild  pulmonary vascular edema Patient was treated with additional DuoNeb's in the ER.  Hospitalist consulted for admission.  Hospital Course: Tanya Cole a 86 y.o.femalewith medical history significant forCAD, with recent cath 10/21 showing severe single-vessel disease with mild in-stent restenosis, HTN, DM, COPD, diastolic heart failure, last EF 60 to 65% on 10/19, with history of recent hospitalization in October for chest pain who presents to the emergency room by EMS with a complaint of wheezing. She has a dry cough but no fever or chills and no nausea vomiting or diarrhea. She received duo nebs and Solu-Medrol in route. ED Course:On arrival she had increased work of breathing. Afebrile, BP 162/77 heart rate 78, respirations 26 with O2 sat 92% on room air. Blood work significant for WBC of 12,000, troponin 35 and BNP 238. Electrolytes mostly within normal limits. Covid and flu test negative EKG as reviewed by me :Normal sinus rhythm rate is 78 with no acute ST-T wave changes Chest x-ray shows mild pulmonary vascular edema Patient was treated with additional DuoNeb's in the ER. Hospitalist consulted for admission.  The patient has been admitted to a telemetry bed. She was made DNR at her request. She is receiving IV steroids and nebulizer treatments.  The patient's respiratory status has gradually improved. She was ambulated on room air today. She was able to ambulate with SaO2 94% on room air. She will be discharged to home in fair condition.  Today's assessment: S: The patient is resting comfortably. No new complaints. O: Vitals:  Vitals:   10/11/20 0805 10/11/20 1115  BP:  133/70 109/63  Pulse: 67 63  Resp:  16  Temp: 98.5 F (36.9 C) 98.6 F (37 C)  SpO2: 98% 96%   Exam:  Constitutional:   The patient is awake, alert, and oriented x 3. No acute distress. Respiratory:   No increased work of breathing.  No wheezes, rales, or rhonchi  No tactile  fremitus Cardiovascular:   Regular rate and rhythm  No murmurs, ectopy, or gallups.  No lateral PMI. No thrills. Abdomen:   Abdomen is soft, non-tender, non-distended  No hernias, masses, or organomegaly  Normoactive bowel sounds.  Musculoskeletal:   No cyanosis, clubbing, or edema Skin:   No rashes, lesions, ulcers  palpation of skin: no induration or nodules Neurologic:   CN 2-12 intact  Sensation all 4 extremities intact Psychiatric:   Mental status ? Mood, affect appropriate ? Orientation to person, place, time   judgment and insight appear intact    Discharge Instructions  Discharge Instructions    Activity as tolerated - No restrictions   Complete by: As directed    Call MD for:  difficulty breathing, headache or visual disturbances   Complete by: As directed    Call MD for:  temperature >100.4   Complete by: As directed    Diet - low sodium heart healthy   Complete by: As directed    Diet Carb Modified   Complete by: As directed    Discharge instructions   Complete by: As directed    Discharge to home. Follow up with PCP in 7-10 days.   Increase activity slowly   Complete by: As directed      Allergies as of 10/11/2020      Reactions   Tramadol Itching, Nausea And Vomiting      Medication List    TAKE these medications   Advair Diskus 250-50 MCG/DOSE Aepb Generic drug: Fluticasone-Salmeterol INHALE ONE PUFF BY MOUTH EVERY 12 HOURS. RINSE MOUTH AFTER EACH USE What changed: See the new instructions.   albuterol (2.5 MG/3ML) 0.083% nebulizer solution Commonly known as: PROVENTIL Take 3 mLs (2.5 mg total) by nebulization every 4 (four) hours as needed for wheezing or shortness of breath.   ProAir HFA 108 (90 Base) MCG/ACT inhaler Generic drug: albuterol Inhale 2 puffs into the lungs every 4 (four) hours as needed for wheezing or shortness of breath.   alendronate 70 MG tablet Commonly known as: FOSAMAX Take 70 mg by mouth once a  week.   aspirin 81 MG tablet Take 81 mg by mouth daily.   atorvastatin 80 MG tablet Commonly known as: LIPITOR Take 1 tablet (80 mg total) by mouth daily at 6 PM.   Compressor/Nebulizer Misc 1 Units by Does not apply route as directed.   ferrous sulfate 325 (65 FE) MG tablet Take 325 mg by mouth once a week.   fluticasone 50 MCG/ACT nasal spray Commonly known as: FLONASE Use 2 spray(s) in each nostril once daily What changed: See the new instructions.   isosorbide mononitrate 30 MG 24 hr tablet Commonly known as: IMDUR Take 1 tablet (30 mg total) by mouth daily.   lisinopril 20 MG tablet Commonly known as: ZESTRIL Take 1 tablet by mouth once daily   metFORMIN 500 MG tablet Commonly known as: GLUCOPHAGE TAKE 1 TABLET BY MOUTH TWICE DAILY WITH A MEAL What changed: See the new instructions.   metoprolol succinate 50 MG 24 hr tablet Commonly known as: TOPROL-XL Take 1 tablet (50 mg total) by mouth daily. Take with or  immediately following a meal.   metoprolol tartrate 25 MG tablet Commonly known as: LOPRESSOR Take 1 tablet (25 mg total) by mouth daily as needed (if Heart rate is >120).   nitroGLYCERIN 0.4 MG SL tablet Commonly known as: NITROSTAT Place 1 tablet (0.4 mg total) under the tongue every 5 (five) minutes as needed for chest pain.   predniSONE 20 MG tablet Commonly known as: DELTASONE Take 2 tablets (40 mg total) by mouth daily with breakfast for 3 days, THEN 1.5 tablets (30 mg total) daily with breakfast for 3 days, THEN 1 tablet (20 mg total) daily with breakfast for 3 days, THEN 0.5 tablets (10 mg total) daily with breakfast for 3 days. Start taking on: October 12, 2020   VISION FORMULA/LUTEIN PO Take 1 tablet by mouth daily.   Vitamin D (Ergocalciferol) 1.25 MG (50000 UNIT) Caps capsule Commonly known as: DRISDOL Take 50,000 Units by mouth once a week.      Allergies  Allergen Reactions  . Tramadol Itching and Nausea And Vomiting    The  results of significant diagnostics from this hospitalization (including imaging, microbiology, ancillary and laboratory) are listed below for reference.    Significant Diagnostic Studies: DG Chest Portable 1 View  Result Date: 10/08/2020 CLINICAL DATA:  COPD EXAM: PORTABLE CHEST 1 VIEW COMPARISON:  August 19, 2020 FINDINGS: The heart size and mediastinal contours are within normal limits. Aortic knob calcifications are seen. Increased prominence of the central pulmonary vasculature is seen. The visualized skeletal structures are unremarkable. IMPRESSION: Mild pulmonary vascular congestion. Electronically Signed   By: Jonna Clark M.D.   On: 10/08/2020 19:31    Microbiology: Recent Results (from the past 240 hour(s))  Resp Panel by RT-PCR (Flu A&B, Covid) Nasopharyngeal Swab     Status: None   Collection Time: 10/08/20  9:15 PM   Specimen: Nasopharyngeal Swab; Nasopharyngeal(NP) swabs in vial transport medium  Result Value Ref Range Status   SARS Coronavirus 2 by RT PCR NEGATIVE NEGATIVE Final    Comment: (NOTE) SARS-CoV-2 target nucleic acids are NOT DETECTED.  The SARS-CoV-2 RNA is generally detectable in upper respiratory specimens during the acute phase of infection. The lowest concentration of SARS-CoV-2 viral copies this assay can detect is 138 copies/mL. A negative result does not preclude SARS-Cov-2 infection and should not be used as the sole basis for treatment or other patient management decisions. A negative result may occur with  improper specimen collection/handling, submission of specimen other than nasopharyngeal swab, presence of viral mutation(s) within the areas targeted by this assay, and inadequate number of viral copies(<138 copies/mL). A negative result must be combined with clinical observations, patient history, and epidemiological information. The expected result is Negative.  Fact Sheet for Patients:  BloggerCourse.com  Fact Sheet  for Healthcare Providers:  SeriousBroker.it  This test is no t yet approved or cleared by the [place] FDA and  has been authorized for detection and/or diagnosis of SARS-CoV-2 by FDA under an Emergency Use Authorization (EUA). This EUA will remain  in effect (meaning this test can be used) for the duration of the COVID-19 declaration under Section 564(b)(1) of the Act, 21 U.S.C.section 360bbb-3(b)(1), unless the authorization is terminated  or revoked sooner.       Influenza A by PCR NEGATIVE NEGATIVE Final   Influenza B by PCR NEGATIVE NEGATIVE Final    Comment: (NOTE) The Xpert Xpress SARS-CoV-2/FLU/RSV plus assay is intended as an aid in the diagnosis of influenza from Nasopharyngeal swab specimens and should  not be used as a sole basis for treatment. Nasal washings and aspirates are unacceptable for Xpert Xpress SARS-CoV-2/FLU/RSV testing.  Fact Sheet for Patients: BloggerCourse.com  Fact Sheet for Healthcare Providers: SeriousBroker.it  This test is not yet approved or cleared by the [place] FDA and has been authorized for detection and/or diagnosis of SARS-CoV-2 by FDA under an Emergency Use Authorization (EUA). This EUA will remain in effect (meaning this test can be used) for the duration of the COVID-19 declaration under Section 564(b)(1) of the Act, 21 U.S.C. section 360bbb-3(b)(1), unless the authorization is terminated or revoked.  Performed at [facility], [address] [place], [place] [address]      Labs: Basic Metabolic Panel: Recent Labs  Lab 10/08/20 2112 10/08/20 2347 10/09/20 0402 10/10/20 0451 10/11/20 0353  NA 139  --  139 138 137  K 4.5  --  4.1 3.9 3.7  CL 106  --  105 101 104  CO2 21*  --  21* 24 23  GLUCOSE 204*  --  348* 211* 152*  BUN 19  --  22 42* 49*  CREATININE 0.92 0.83 1.04* 0.92 0.85  CALCIUM 9.1  --  9.2 9.4 9.1  MG 2.3  --    --   --   --    Liver Function Tests: Recent Labs  Lab 10/08/20 2112  AST 20  ALT 14  ALKPHOS 79  BILITOT 0.7  PROT 7.7  ALBUMIN 4.2   No results for input(s): LIPASE, AMYLASE in the last 168 hours. No results for input(s): AMMONIA in the last 168 hours. CBC: Recent Labs  Lab 10/08/20 2112 10/08/20 2347 10/11/20 0353  WBC 12.5* 11.7* 21.2*  NEUTROABS 11.0*  --   --   HGB 13.7 13.1 12.6  HCT 41.5 41.1 38.5  MCV 88.9 89.9 86.9  PLT 247 251 301   Cardiac Enzymes: No results for input(s): CKTOTAL, CKMB, CKMBINDEX, TROPONINI in the last 168 hours. BNP: BNP (last 3 results) Recent Labs    08/07/20 1135 10/08/20 2112  BNP 259.6* 238.3*    ProBNP (last 3 results) No results for input(s): PROBNP in the last 8760 hours.  CBG: Recent Labs  Lab 10/10/20 1303 10/10/20 1737 10/10/20 2053 10/11/20 0804 10/11/20 1117  GLUCAP 258* 251* 156* 129* 166*    Principal Problem:   COPD with acute exacerbation (HCC) Active Problems:   Hypertension   Diabetes mellitus with cardiac complication (HCC)   Coronary artery disease   Elevated troponin   Acute on chronic heart failure with preserved ejection fraction (HFpEF) (HCC)   Time coordinating discharge: 38 minutes  Signed:        Pixie Burgener, DO [facility]  10/11/2020, 3:32 PM

## 2020-10-11 NOTE — Plan of Care (Signed)
  Problem: Education: Goal: Knowledge of General Education information will improve Description: Including pain rating scale, medication(s)/side effects and non-pharmacologic comfort measures Outcome: Progressing   Problem: Clinical Measurements: Goal: Respiratory complications will improve Outcome: Progressing   Problem: Safety: Goal: Ability to remain free from injury will improve Outcome: Progressing   

## 2020-10-11 NOTE — Progress Notes (Signed)
Pts O2 sats during ambulation with a walker stayed at 94-95% on room air. HR stayed in the 70s and pt had no c/o chest pain, SOB, or dizziness. MD aware. Will continue to monitor.

## 2020-10-12 ENCOUNTER — Telehealth: Payer: Self-pay

## 2020-10-12 NOTE — Telephone Encounter (Signed)
Transition Care Management Follow-up Telephone Call  Date of discharge and from where: 10/11/20 from [facility].  How have you been since you were released from the hospital? I feel weak and a little nervous on the inside. Sooner appointment offered, declined. Agrees to call back for rescheduling if worsening symptoms and as needed. Denies pain, N/V/D, dyspnea, wheezing, dizziness. Room air. Slept really good last night. Appetite is good.   Any questions or concerns? No  Items Reviewed:  Did the pt receive and understand the discharge instructions provided? Yes , increase activity slowly. Weigh daily. Verbalizes understanding of weight gain/loss protocol.   Medications obtained and verified? Yes   Other? Yes , stepdown prednisone.  Any new allergies since your discharge? No   Dietary orders reviewed? Heart healthy/modified carbohydrate.   Do you have support at home? Yes , staying with daughter.   Home Care and Equipment/Supplies: Were home health services ordered? No Were any new equipment or medical supplies ordered?  No Functional Questionnaire: (I = Independent and D = Dependent) ADLs: i  Bathing/Dressing- i  Meal Prep- family assist  Eating- i  Maintaining continence- i  Transferring/Ambulation- walker/cane/power chair as needed  Managing Meds- i  Follow up appointments reviewed:   PCP Hospital f/u appt confirmed? Yes  Scheduled to see Dr. Lorin Picket on 10/19/20 @ 2:30. Plans to keep previously scheduled appointment but will call the office if anything changes.  Specialist Hospital f/u appt confirmed? Yes  Scheduled to see Cardiology on 10/16/20 @ 10:30.  Are transportation arrangements needed? No   If their condition worsens, is the pt aware to call PCP or go to the Emergency Dept.? Yes  Was the patient provided with contact information for the PCP's office or ED? Yes  Was to pt encouraged to call back with questions or concerns? Yes

## 2020-10-14 ENCOUNTER — Telehealth: Payer: Self-pay | Admitting: Internal Medicine

## 2020-10-14 NOTE — Telephone Encounter (Signed)
Patient's daughter  Victorino Dike called in concern about what her sister brought for her mother to take wanted to know if it is ok for her take glucerna

## 2020-10-14 NOTE — Telephone Encounter (Signed)
Given that she is diabetic, I would recommend premier protein (only has 1 gram of sugar) - shakes.

## 2020-10-14 NOTE — Telephone Encounter (Signed)
Spoke with daughter and advised on patient not drinking the glucerna but having patient drink premier protein instead; daughter agrees to switch.

## 2020-10-14 NOTE — Telephone Encounter (Signed)
Are you okay with her drinking glucerna?

## 2020-10-15 NOTE — Progress Notes (Signed)
[name], female    DOB: 03-01-1933, 86 y.o.   MRN: [id number]  HPI  Tanya Cole is a 86 y/o female with a history of asthma, CAD, DM, hyperlipidemia, HTN and chronic heart failure.   Echo report from 08/08/20 reviewed and showed an EF of 60-65% along with mild LAE and mild MR.   LHC done 08/10/20 showed:  Mid LAD lesion is 60% stenosed.  Prox LAD lesion is 30% stenosed.  Dist LAD lesion is 90% stenosed.  Prox RCA lesion is 30% stenosed.  The left ventricular systolic function is normal.  LV end diastolic pressure is mildly elevated.  The left ventricular ejection fraction is 55-65% by visual estimate.   1.  Significant one-vessel coronary artery disease with patent proximal LAD stent with mild in-stent restenosis.  Stable mid LAD stenosis at 60% and significant distal LAD stenosis 90% close to the apex. 2.  Normal LV systolic function mildly elevated left ventricular end-diastolic pressure.  Admitted 10/08/20 due to wheezing and shortness of breath. Nebulizer and steroids given for COPD exacerbation. Discharged after 3 days.    She presents today for her initial visit with a chief complaint of minimal shortness of breath upon moderate exertion. She describes this as chronic in nature having been present for several years. She has associated fatigue and occasional chest tightness along with this. She denies any difficulty sleeping, dizziness, abdominal distention, palpitations, pedal edema, chest pain or cough.       She says that she has scales at home but hasn't been weighing herself.   Past Medical History:  Diagnosis Date  . Aortic insufficiency    a. TTE 12/19: EF 55-60%, probable HK of the mid apical anterior septal myocardium, Gr1DD, mild AI, mildly dilated LA  . Asthma   . CAD (coronary artery disease)    a. NSTEMI 12/19; b. LHC 10/08/18: LM minimal luminal irregs, mLAD-1 95% s/p PCI/DES, mLAD-2 60%, LCx mild diffuse disease throughout, RCA minimal  luminal irregs; b. 03/2020 MV: EF>65%, no ischemia/scar.  . CHF (congestive heart failure) (HCC)   . Diabetes mellitus (HCC)   . Hypercholesterolemia   . Hypertension   . Myocardial infarction (HCC)   . Osteopenia   . Palpitations    a. 04/2020 Zio: Avg HR 75. 429 SVT episodes, longest 19 secs @ 133. Occas PACs (3.2%). Rare PVCs (<1%).  . Polymyalgia rheumatica syndrome (HCC)   . Reactive airway disease    Past Surgical History:  Procedure Laterality Date  . ABDOMINAL HYSTERECTOMY  1981   prolapse and bleeding, ovaries not removed  . BREAST EXCISIONAL BIOPSY Right   . CHOLECYSTECTOMY N/A 09/02/2019   Procedure: LAPAROSCOPIC CHOLECYSTECTOMY WITH INTRAOPERATIVE CHOLANGIOGRAM;  Surgeon: Henrene Dodge, MD;  Location: [facility];  Service: General;  Laterality: N/A;  . CORONARY STENT INTERVENTION N/A 10/08/2018   Procedure: CORONARY STENT INTERVENTION;  Surgeon: Iran Ouch, MD;  Location: [facility];  Service: Cardiovascular;  Laterality: N/A;  . ENDOSCOPIC RETROGRADE CHOLANGIOPANCREATOGRAPHY (ERCP) WITH PROPOFOL N/A 08/08/2019   Procedure: ENDOSCOPIC RETROGRADE CHOLANGIOPANCREATOGRAPHY (ERCP) WITH PROPOFOL;  Surgeon: Midge Minium, MD;  Location: [facility];  Service: Endoscopy;  Laterality: N/A;  . LEFT HEART CATH AND CORONARY ANGIOGRAPHY N/A 10/08/2018   Procedure: LEFT HEART CATH AND CORONARY ANGIOGRAPHY;  Surgeon: Iran Ouch, MD;  Location: [facility];  Service: Cardiovascular;  Laterality: N/A;  . LEFT HEART CATH AND CORONARY ANGIOGRAPHY N/A 08/10/2020   Procedure: LEFT HEART CATH AND CORONARY ANGIOGRAPHY  possible percutaneous intervention;  Surgeon: Iran Ouch, MD;  Location: [facility];  Service: Cardiovascular;  Laterality: N/A;  . UMBILICAL HERNIA REPAIR  7/94   Family History  Problem Relation Age of Onset  . Heart attack Father   . Arthritis Mother   . Heart disease Mother   . Throat cancer Sister   . Parkinson's disease Sister    . COPD Brother    Social History   Tobacco Use  . Smoking status: Never Smoker  . Smokeless tobacco: Never Used  Substance Use Topics  . Alcohol use: No    Alcohol/week: 0.0 standard drinks   Allergies  Allergen Reactions  . Tramadol Itching and Nausea And Vomiting   Prior to Admission medications   Medication Sig Start Date End Date Taking? Authorizing Provider  ADVAIR DISKUS 250-50 MCG/DOSE AEPB INHALE ONE PUFF BY MOUTH EVERY 12 HOURS. RINSE MOUTH AFTER EACH USE Patient taking differently: Inhale 1 puff into the lungs 2 (two) times daily. 02/17/16  Yes Dale Martin, MD  albuterol (PROVENTIL) (2.5 MG/3ML) 0.083% nebulizer solution Take 3 mLs (2.5 mg total) by nebulization every 4 (four) hours as needed for wheezing or shortness of breath. 08/18/20  Yes Dale Navesink, MD  alendronate (FOSAMAX) 70 MG tablet Take 70 mg by mouth once a week.  11/06/17  Yes [provider]  aspirin 81 MG tablet Take 81 mg by mouth daily.   Yes [provider]  atorvastatin (LIPITOR) 80 MG tablet Take 1 tablet (80 mg total) by mouth daily at 6 PM. 04/01/20  Yes Alver Sorrow, NP  ferrous sulfate 325 (65 FE) MG tablet Take 325 mg by mouth once a week.    Yes [provider]  fluticasone (FLONASE) 50 MCG/ACT nasal spray Use 2 spray(s) in each nostril once daily Patient taking differently: Place 2 sprays into both nostrils daily. 08/03/20  Yes Dale [place], MD  isosorbide mononitrate (IMDUR) 30 MG 24 hr tablet Take 1 tablet (30 mg total) by mouth daily. 08/11/20  Yes Arnetha Courser, MD  lisinopril (ZESTRIL) 20 MG tablet Take 1 tablet by mouth once daily 06/11/20  Yes Dunn, Ryan M, PA-C  metFORMIN (GLUCOPHAGE) 500 MG tablet TAKE 1 TABLET BY MOUTH TWICE DAILY WITH A MEAL Patient taking differently: Take 500 mg by mouth 2 (two) times daily with a meal. 09/29/20  Yes Dale Ogden, MD  metoprolol succinate (TOPROL-XL) 50 MG 24 hr tablet Take 1 tablet (50 mg total) by mouth daily.  Take with or immediately following a meal. 08/21/20  Yes Rolly Salter, MD  metoprolol tartrate (LOPRESSOR) 25 MG tablet Take 1 tablet (25 mg total) by mouth daily as needed (if Heart rate is >120). 08/20/20  Yes Rolly Salter, MD  Multiple Vitamins-Minerals (VISION FORMULA/LUTEIN PO) Take 1 tablet by mouth daily.    Yes [provider]  Nebulizers (COMPRESSOR/NEBULIZER) MISC 1 Units by Does not apply route as directed. 04/07/20  Yes Dionne Bucy, MD  nitroGLYCERIN (NITROSTAT) 0.4 MG SL tablet Place 1 tablet (0.4 mg total) under the tongue every 5 (five) minutes as needed for chest pain. 10/09/18  Yes Mody, Patricia Pesa, MD  predniSONE (DELTASONE) 20 MG tablet Take 2 tablets (40 mg total) by mouth daily with breakfast for 3 days, THEN 1.5 tablets (30 mg total) daily with breakfast for 3 days, THEN 1 tablet (20 mg total) daily with breakfast for 3 days, THEN 0.5 tablets (10 mg total) daily with breakfast for 3 days. 10/12/20 10/24/20  Yes Swayze, Ava, DO  PROAIR HFA 108 (90 Base) MCG/ACT inhaler Inhale 2 puffs into the lungs every 4 (four) hours as needed for wheezing or shortness of breath. 08/20/20  Yes Rolly Salter, MD  Vitamin D, Ergocalciferol, (DRISDOL) 50000 units CAPS capsule Take 50,000 Units by mouth once a week. 06/24/18  Yes [provider]           Review of Systems  Constitutional: Positive for fatigue. Negative for appetite change.  HENT: Negative for congestion, rhinorrhea and sore throat.   Eyes: Negative.   Respiratory: Positive for chest tightness and shortness of breath. Negative for cough.   Cardiovascular: Negative for chest pain, palpitations and leg swelling.  Gastrointestinal: Negative for abdominal distention and abdominal pain.  Endocrine: Negative.   Genitourinary: Negative.   Musculoskeletal: Negative for back pain and neck pain.  Skin: Negative.   Allergic/Immunologic: Negative.   Neurological: Negative for dizziness and light-headedness.   Hematological: Negative for adenopathy. Does not bruise/bleed easily.  Psychiatric/Behavioral: Negative for dysphoric mood and sleep disturbance (sleeping on 2 pillows). The patient is not nervous/anxious.    Vitals:   10/16/20 1038  BP: (!) 149/63  Pulse: 63  Resp: 16  SpO2: 99%  Weight: 120 lb (54.4 kg)  Height:  (1.549 m)   Wt Readings from Last 3 Encounters:  10/16/20 120 lb (54.4 kg)  10/11/20 116 lb 3.2 oz (52.7 kg)  09/15/20 118 lb (53.5 kg)   Lab Results  Component Value Date   CREATININE 0.85 10/11/2020   CREATININE 0.92 10/10/2020   CREATININE 1.04 (H) 10/09/2020   Physical Exam Vitals and nursing note reviewed. Exam conducted with a chaperone present (friend present).  Constitutional:      Appearance: She is well-developed.  HENT:     Head: Normocephalic and atraumatic.  Neck:     Vascular: No JVD.  Cardiovascular:     Rate and Rhythm: Normal rate and regular rhythm.  Pulmonary:     Effort: Pulmonary effort is normal. No respiratory distress.     Breath sounds: No wheezing or rales.  Abdominal:     Tenderness: There is no abdominal tenderness.  Musculoskeletal:     Cervical back: Neck supple.     Right lower leg: No tenderness. No edema.     Left lower leg: No tenderness. No edema.  Skin:    General: Skin is warm and dry.  Neurological:     General: No focal deficit present.     Mental Status: She is alert and oriented to person, place, and time.  Psychiatric:        Mood and Affect: Mood normal.        Behavior: Behavior normal.    Assessment & Plan:  1: Chronic heart failure with preserved ejection fraction with structural changes (LAE)- - NYHA class II - euvolemic today - has scales but hasn't been weighing daily; instructed to begin weighing daily so that she can call for an overnight weight gain of > 2 pounds or a weekly weight gain of > 5 pounds - not adding additional salt to her food and tries to be mindful of what she's eating;  dietary information and low sodium cookbook were given to the patient - saw cardiology Kirke Corin) 09/15/20 - BNP 10/08/20 was 238.3 - she says that she doesn't get the flu vaccine - doesn't want to get the covid vaccine either  2: HTN- - BP mildly elevated; continue to monitor - had telemedicine visit with  PCP Lorin Picket) 08/27/20 - BMP 10/11/20 reviewed and showed sodium 137, potassium 3.7, creatinine 0.85 and GFR >60  3: DM- - A1c 10/08/20 was 6.3% - nonfasting glucose in clinic today was 149   Patient did not bring her medications nor a list. Each medication was verbally reviewed with the patient and she was encouraged to bring the bottles to every visit to confirm accuracy of list.  Return in 2 months or sooner for any questions/problems before then.

## 2020-10-16 ENCOUNTER — Encounter: Payer: Self-pay | Admitting: Family

## 2020-10-16 ENCOUNTER — Ambulatory Visit: Payer: Medicare Other | Attending: Family | Admitting: Family

## 2020-10-16 ENCOUNTER — Other Ambulatory Visit: Payer: Self-pay

## 2020-10-16 VITALS — BP 149/63 | HR 63 | Resp 16 | Ht 61.0 in | Wt 120.0 lb

## 2020-10-16 DIAGNOSIS — Z79899 Other long term (current) drug therapy: Secondary | ICD-10-CM | POA: Insufficient documentation

## 2020-10-16 DIAGNOSIS — I10 Essential (primary) hypertension: Secondary | ICD-10-CM

## 2020-10-16 DIAGNOSIS — I5032 Chronic diastolic (congestive) heart failure: Secondary | ICD-10-CM | POA: Diagnosis not present

## 2020-10-16 DIAGNOSIS — I252 Old myocardial infarction: Secondary | ICD-10-CM | POA: Insufficient documentation

## 2020-10-16 DIAGNOSIS — I110 Hypertensive heart disease with heart failure: Secondary | ICD-10-CM | POA: Insufficient documentation

## 2020-10-16 DIAGNOSIS — R0602 Shortness of breath: Secondary | ICD-10-CM | POA: Insufficient documentation

## 2020-10-16 DIAGNOSIS — Z8249 Family history of ischemic heart disease and other diseases of the circulatory system: Secondary | ICD-10-CM | POA: Diagnosis not present

## 2020-10-16 DIAGNOSIS — Z885 Allergy status to narcotic agent status: Secondary | ICD-10-CM | POA: Insufficient documentation

## 2020-10-16 DIAGNOSIS — I2510 Atherosclerotic heart disease of native coronary artery without angina pectoris: Secondary | ICD-10-CM | POA: Insufficient documentation

## 2020-10-16 DIAGNOSIS — E119 Type 2 diabetes mellitus without complications: Secondary | ICD-10-CM | POA: Diagnosis not present

## 2020-10-16 DIAGNOSIS — R5383 Other fatigue: Secondary | ICD-10-CM | POA: Diagnosis not present

## 2020-10-16 DIAGNOSIS — Z7982 Long term (current) use of aspirin: Secondary | ICD-10-CM | POA: Diagnosis not present

## 2020-10-16 DIAGNOSIS — J441 Chronic obstructive pulmonary disease with (acute) exacerbation: Secondary | ICD-10-CM | POA: Diagnosis not present

## 2020-10-16 DIAGNOSIS — Z955 Presence of coronary angioplasty implant and graft: Secondary | ICD-10-CM | POA: Insufficient documentation

## 2020-10-16 DIAGNOSIS — E785 Hyperlipidemia, unspecified: Secondary | ICD-10-CM | POA: Diagnosis not present

## 2020-10-16 DIAGNOSIS — Z7984 Long term (current) use of oral hypoglycemic drugs: Secondary | ICD-10-CM | POA: Diagnosis not present

## 2020-10-16 DIAGNOSIS — Z7952 Long term (current) use of systemic steroids: Secondary | ICD-10-CM | POA: Insufficient documentation

## 2020-10-16 DIAGNOSIS — Z888 Allergy status to other drugs, medicaments and biological substances status: Secondary | ICD-10-CM | POA: Insufficient documentation

## 2020-10-16 LAB — GLUCOSE, CAPILLARY: Glucose-Capillary: 149 mg/dL — ABNORMAL HIGH (ref 70–99)

## 2020-10-16 NOTE — Patient Instructions (Addendum)
Begin weighing daily and call for an overnight weight gain of > 2 pounds or a weekly weight gain of >5 pounds. 

## 2020-10-19 ENCOUNTER — Ambulatory Visit (INDEPENDENT_AMBULATORY_CARE_PROVIDER_SITE_OTHER): Payer: Medicare Other | Admitting: Internal Medicine

## 2020-10-19 ENCOUNTER — Other Ambulatory Visit: Payer: Self-pay

## 2020-10-19 ENCOUNTER — Ambulatory Visit (INDEPENDENT_AMBULATORY_CARE_PROVIDER_SITE_OTHER): Payer: Medicare Other

## 2020-10-19 VITALS — BP 132/78 | HR 59 | Temp 98.5°F | Resp 16 | Ht 61.0 in | Wt 120.4 lb

## 2020-10-19 DIAGNOSIS — D72829 Elevated white blood cell count, unspecified: Secondary | ICD-10-CM

## 2020-10-19 DIAGNOSIS — E1159 Type 2 diabetes mellitus with other circulatory complications: Secondary | ICD-10-CM

## 2020-10-19 DIAGNOSIS — R9389 Abnormal findings on diagnostic imaging of other specified body structures: Secondary | ICD-10-CM | POA: Diagnosis not present

## 2020-10-19 DIAGNOSIS — I10 Essential (primary) hypertension: Secondary | ICD-10-CM

## 2020-10-19 DIAGNOSIS — E119 Type 2 diabetes mellitus without complications: Secondary | ICD-10-CM | POA: Diagnosis not present

## 2020-10-19 DIAGNOSIS — G894 Chronic pain syndrome: Secondary | ICD-10-CM | POA: Diagnosis not present

## 2020-10-19 DIAGNOSIS — I25118 Atherosclerotic heart disease of native coronary artery with other forms of angina pectoris: Secondary | ICD-10-CM

## 2020-10-19 DIAGNOSIS — R0781 Pleurodynia: Secondary | ICD-10-CM | POA: Diagnosis not present

## 2020-10-19 DIAGNOSIS — H579 Unspecified disorder of eye and adnexa: Secondary | ICD-10-CM | POA: Diagnosis not present

## 2020-10-19 DIAGNOSIS — J9811 Atelectasis: Secondary | ICD-10-CM | POA: Diagnosis not present

## 2020-10-19 DIAGNOSIS — D509 Iron deficiency anemia, unspecified: Secondary | ICD-10-CM

## 2020-10-19 DIAGNOSIS — E7800 Pure hypercholesterolemia, unspecified: Secondary | ICD-10-CM | POA: Diagnosis not present

## 2020-10-19 DIAGNOSIS — M069 Rheumatoid arthritis, unspecified: Secondary | ICD-10-CM | POA: Diagnosis not present

## 2020-10-19 DIAGNOSIS — R079 Chest pain, unspecified: Secondary | ICD-10-CM | POA: Diagnosis not present

## 2020-10-19 LAB — HM DIABETES FOOT EXAM

## 2020-10-19 NOTE — Progress Notes (Signed)
[name], female   DOB: [birth year], 86 y.o.   MRN: [id number]   Subjective:    [name], female    DOB: 06/26/1933, 86 y.o.   MRN: [id number]  HPI This visit occurred during the SARS-CoV-2 public health emergency.  Safety protocols were in place, including screening questions prior to the visit, additional usage of staff PPE, and extensive cleaning of exam room while observing appropriate contact time as indicated for disinfecting solutions.  Patient here for hospital follow up.  Admitted 10/08/20 - 10/11/20 with COPD exacerbation.  Recent cath 10/21 revealed severe single vessel disease with mild in-stent restenosis, hypertension, diastolic heart failure with EF 60-65% and COPD.  Presented to ER with chest pain and wheezing with associated dry cough.  covid and flu test negative.  Received IV steroids and nebs.  Respiratory status gradually improved.  covid and flu tests - negative.  cxr - mild pulmonary vascular edema.  Gradually improved and discharged home.  States she is feeling better.  Breathing better.  No increased cough or congestion.  Eating.  No nausea or vomiting.  No chest pain.    Past Medical History:  Diagnosis Date   Aortic insufficiency    a. TTE 12/19: EF 55-60%, probable HK of the mid apical anterior septal myocardium, Gr1DD, mild AI, mildly dilated LA   Asthma    CAD (coronary artery disease)    a. NSTEMI 12/19; b. LHC 10/08/18: LM minimal luminal irregs, mLAD-1 95% s/p PCI/DES, mLAD-2 60%, LCx mild diffuse disease throughout, RCA minimal luminal irregs; b. 03/2020 MV: EF>65%, no ischemia/scar.   CHF (congestive heart failure) ([place])    Diabetes mellitus ([place])    Hypercholesterolemia    Hypertension    Myocardial infarction ([place])    Osteopenia    Palpitations    a. 04/2020 Zio: Avg HR 75. 429 SVT episodes, longest 19 secs @ 133. Occas PACs (3.2%). Rare PVCs (<1%).   Polymyalgia rheumatica syndrome ([place])    Reactive  airway disease    Past Surgical History:  Procedure Laterality Date   ABDOMINAL HYSTERECTOMY  1981   prolapse and bleeding, ovaries not removed   BREAST EXCISIONAL BIOPSY Right    CHOLECYSTECTOMY N/A 09/02/2019   Procedure: LAPAROSCOPIC CHOLECYSTECTOMY WITH INTRAOPERATIVE CHOLANGIOGRAM;  Surgeon: Olean Ree, MD;  Location: [facility];  Service: General;  Laterality: N/A;   CORONARY STENT INTERVENTION N/A 10/08/2018   Procedure: CORONARY STENT INTERVENTION;  Surgeon: Wellington Hampshire, MD;  Location: [place] [facility];  Service: Cardiovascular;  Laterality: N/A;   ENDOSCOPIC RETROGRADE CHOLANGIOPANCREATOGRAPHY (ERCP) WITH PROPOFOL N/A 08/08/2019   Procedure: ENDOSCOPIC RETROGRADE CHOLANGIOPANCREATOGRAPHY (ERCP) WITH PROPOFOL;  Surgeon: Lucilla Lame, MD;  Location: [facility];  Service: Endoscopy;  Laterality: N/A;   LEFT HEART CATH AND CORONARY ANGIOGRAPHY N/A 10/08/2018   Procedure: LEFT HEART CATH AND CORONARY ANGIOGRAPHY;  Surgeon: Wellington Hampshire, MD;  Location: [facility];  Service: Cardiovascular;  Laterality: N/A;   LEFT HEART CATH AND CORONARY ANGIOGRAPHY N/A 08/10/2020   Procedure: LEFT HEART CATH AND CORONARY ANGIOGRAPHY possible percutaneous intervention;  Surgeon: Wellington Hampshire, MD;  Location: [facility];  Service: Cardiovascular;  Laterality: N/A;   UMBILICAL HERNIA REPAIR  7/94   Family History  Problem Relation Age of Onset   Heart attack Father    Arthritis Mother    Heart disease Mother    Throat cancer Sister    Parkinson's disease Sister    COPD Brother  Social History   Socioeconomic History   Marital status: Widowed    Spouse name: Not on file   Number of children: 3   Years of education: Not on file   Highest education level: Not on file  Occupational History   Not on file  Tobacco Use   Smoking status: Never Smoker   Smokeless tobacco: Never Used  Vaping Use   Vaping Use: Never used  Substance and  Sexual Activity   Alcohol use: No    Alcohol/week: 0.0 standard drinks   Drug use: No   Sexual activity: Not Currently  Other Topics Concern   Not on file  Social History Narrative   No smoking; no alcohol; in [place]; worked in [employer] fundraiser. Lives by self in [place]. Does all of her own housework. Dtr does food shopping for her.   Social Determinants of Health   Financial Resource Strain: Not on file  Food Insecurity: Not on file  Transportation Needs: Not on file  Physical Activity: Not on file  Stress: Not on file  Social Connections: Moderately Integrated   Frequency of Communication with Friends and Family: More than three times a week   Frequency of Social Gatherings with Friends and Family: More than three times a week   Attends Religious Services: More than 4 times per year   Active Member of [employer] or Organizations: Yes   Attends Archivist Meetings: Not on file   Marital Status: Widowed    Outpatient Encounter Medications as of 10/19/2020  Medication Sig   ADVAIR DISKUS 250-50 MCG/DOSE AEPB INHALE ONE PUFF BY MOUTH EVERY 12 HOURS. RINSE MOUTH AFTER EACH USE (Patient taking differently: Inhale 1 puff into the lungs 2 (two) times daily.)   albuterol (PROVENTIL) (2.5 MG/3ML) 0.083% nebulizer solution Take 3 mLs (2.5 mg total) by nebulization every 4 (four) hours as needed for wheezing or shortness of breath.   alendronate (FOSAMAX) 70 MG tablet Take 70 mg by mouth once a week.    aspirin 81 MG tablet Take 81 mg by mouth daily.   atorvastatin (LIPITOR) 80 MG tablet Take 1 tablet (80 mg total) by mouth daily at 6 PM.   ferrous sulfate 325 (65 FE) MG tablet Take 325 mg by mouth once a week.    fluticasone (FLONASE) 50 MCG/ACT nasal spray Use 2 spray(s) in each nostril once daily (Patient taking differently: Place 2 sprays into both nostrils daily.)   isosorbide mononitrate (IMDUR) 30 MG 24 hr tablet Take 1 tablet (30 mg total) by mouth daily.    lisinopril (ZESTRIL) 20 MG tablet Take 1 tablet by mouth once daily   metFORMIN (GLUCOPHAGE) 500 MG tablet TAKE 1 TABLET BY MOUTH TWICE DAILY WITH A MEAL (Patient taking differently: Take 500 mg by mouth 2 (two) times daily with a meal.)   metoprolol succinate (TOPROL-XL) 50 MG 24 hr tablet Take 1 tablet (50 mg total) by mouth daily. Take with or immediately following a meal.   metoprolol tartrate (LOPRESSOR) 25 MG tablet Take 1 tablet (25 mg total) by mouth daily as needed (if Heart rate is >120).   Multiple Vitamins-Minerals (VISION FORMULA/LUTEIN PO) Take 1 tablet by mouth daily.    Nebulizers (COMPRESSOR/NEBULIZER) MISC 1 Units by Does not apply route as directed.   nitroGLYCERIN (NITROSTAT) 0.4 MG SL tablet Place 1 tablet (0.4 mg total) under the tongue every 5 (five) minutes as needed for chest pain.   PROAIR HFA 108 (90 Base) MCG/ACT inhaler Inhale 2 puffs into the  lungs every 4 (four) hours as needed for wheezing or shortness of breath.   Vitamin D, Ergocalciferol, (DRISDOL) 50000 units CAPS capsule Take 50,000 Units by mouth once a week.   [DISCONTINUED] predniSONE (DELTASONE) 20 MG tablet Take 2 tablets (40 mg total) by mouth daily with breakfast for 3 days, THEN 1.5 tablets (30 mg total) daily with breakfast for 3 days, THEN 1 tablet (20 mg total) daily with breakfast for 3 days, THEN 0.5 tablets (10 mg total) daily with breakfast for 3 days.   No facility-administered encounter medications on file as of 10/19/2020.    Review of Systems  Constitutional: Negative for appetite change and unexpected weight change.  HENT: Negative for congestion.   Respiratory: Negative for cough and chest tightness.        Breathing improved.    Cardiovascular: Negative for chest pain, palpitations and leg swelling.  Gastrointestinal: Negative for abdominal pain, diarrhea, nausea and vomiting.  Genitourinary: Negative for difficulty urinating and dysuria.  Musculoskeletal: Negative for joint  swelling and myalgias.  Skin: Negative for color change and rash.  Neurological: Negative for dizziness, light-headedness and headaches.  Psychiatric/Behavioral: Negative for agitation and dysphoric mood.       Objective:    Physical Exam Vitals reviewed.  Constitutional:      General: She is not in acute distress.    Appearance: Normal appearance.  HENT:     Head: Normocephalic and atraumatic.     Right Ear: External ear normal.     Left Ear: External ear normal.     Mouth/Throat:     Mouth: Oropharynx is clear and moist.  Eyes:     General: No scleral icterus.       Right eye: No discharge.        Left eye: No discharge.     Conjunctiva/sclera: Conjunctivae normal.  Neck:     Thyroid: No thyromegaly.  Cardiovascular:     Rate and Rhythm: Normal rate and regular rhythm.  Pulmonary:     Effort: No respiratory distress.     Breath sounds: Normal breath sounds. No wheezing.  Abdominal:     General: Bowel sounds are normal.     Palpations: Abdomen is soft.     Tenderness: There is no abdominal tenderness.  Musculoskeletal:        General: No swelling, tenderness or edema.     Cervical back: Neck supple. No tenderness.  Lymphadenopathy:     Cervical: No cervical adenopathy.  Skin:    Findings: No erythema or rash.  Neurological:     Mental Status: She is alert.  Psychiatric:        Mood and Affect: Mood normal.        Behavior: Behavior normal.     BP 132/78    Pulse (!) 59    Temp 98.5 F (36.9 C) (Oral)    Resp 16    Ht _0  (1.549 m)    Wt 120 lb 6.4 oz (54.6 kg)    SpO2 99%    BMI 22.75 kg/m  Wt Readings from Last 3 Encounters:  10/19/20 120 lb 6.4 oz (54.6 kg)  10/16/20 120 lb (54.4 kg)  10/11/20 116 lb 3.2 oz (52.7 kg)     Lab Results  Component Value Date   WBC 16.5 (H) 10/19/2020   HGB 14.8 10/19/2020   HCT 44.8 10/19/2020   PLT 253.0 10/19/2020   GLUCOSE 90 10/19/2020   CHOL 116 08/08/2020   TRIG 164 (H)  08/08/2020   HDL 46 08/08/2020    LDLDIRECT 136.0 07/03/2020   LDLCALC 37 08/08/2020   ALT 14 10/19/2020   AST 15 10/19/2020   NA 137 10/19/2020   K 4.9 10/19/2020   CL 100 10/19/2020   CREATININE 0.75 10/19/2020   BUN 22 10/19/2020   CO2 30 10/19/2020   TSH 3.59 09/17/2019   INR 1.1 08/07/2020   HGBA1C 6.3 (H) 10/08/2020   MICROALBUR 3.1 (H) 07/03/2020    DG Chest Portable 1 View  Result Date: 10/08/2020 CLINICAL DATA:  COPD EXAM: PORTABLE CHEST 1 VIEW COMPARISON:  August 19, 2020 FINDINGS: The heart size and mediastinal contours are within normal limits. Aortic knob calcifications are seen. Increased prominence of the central pulmonary vasculature is seen. The visualized skeletal structures are unremarkable. IMPRESSION: Mild pulmonary vascular congestion. Electronically Signed   By: Prudencio Pair M.D.   On: 10/08/2020 19:31       Assessment & Plan:   Problem List Items Addressed This Visit    Hypertension    Blood pressure doing well on lisinopril, imdur and metoprolol.  Follow pressures.  Follow metabolic panel.       Hypercholesterolemia    On lipitor.  Follow lipid panel and liver function tests.        Relevant Orders   Hepatic function panel (Completed)   Basic metabolic panel (Completed)   Diabetes mellitus with cardiac complication ([place])    Has been on metformin.  Follow met b and a1c.       Coronary artery disease    Known CAD.  S/p PCI/DES - mid LAD.  Continues on metoprolol, imdur and lisinopril.  Currently with no chest pain.  Follow.       Anemia    Follow cbc.        Leukocytosis - Primary   Relevant Orders   CBC with Differential/Platelet (Completed)   Type 2 diabetes mellitus without complication (HCC)   Rib pain on right side   Relevant Orders   DG Chest 2 View (Completed)   Abnormal CXR    Symptoms have improved.  Breathing better.  Check cxr to confirm clear.       Relevant Orders   DG Chest 2 View (Completed)       Einar Pheasant, MD

## 2020-10-20 ENCOUNTER — Telehealth: Payer: Self-pay

## 2020-10-20 LAB — CBC WITH DIFFERENTIAL/PLATELET
Basophils Absolute: 0.1 10*3/uL (ref 0.0–0.1)
Basophils Relative: 0.4 % (ref 0.0–3.0)
Eosinophils Absolute: 0.2 10*3/uL (ref 0.0–0.7)
Eosinophils Relative: 0.9 % (ref 0.0–5.0)
HCT: 44.8 % (ref 36.0–46.0)
Hemoglobin: 14.8 g/dL (ref 12.0–15.0)
Lymphocytes Relative: 22.7 % (ref 12.0–46.0)
Lymphs Abs: 3.7 10*3/uL (ref 0.7–4.0)
MCHC: 32.9 g/dL (ref 30.0–36.0)
MCV: 88.1 fl (ref 78.0–100.0)
Monocytes Absolute: 0.9 10*3/uL (ref 0.1–1.0)
Monocytes Relative: 5.3 % (ref 3.0–12.0)
Neutro Abs: 11.6 10*3/uL — ABNORMAL HIGH (ref 1.4–7.7)
Neutrophils Relative %: 70.7 % (ref 43.0–77.0)
Platelets: 253 10*3/uL (ref 150.0–400.0)
RBC: 5.09 Mil/uL (ref 3.87–5.11)
RDW: 15.2 % (ref 11.5–15.5)
WBC: 16.5 10*3/uL — ABNORMAL HIGH (ref 4.0–10.5)

## 2020-10-20 LAB — BASIC METABOLIC PANEL
BUN: 22 mg/dL (ref 6–23)
CO2: 30 mEq/L (ref 19–32)
Calcium: 9.6 mg/dL (ref 8.4–10.5)
Chloride: 100 mEq/L (ref 96–112)
Creatinine, Ser: 0.75 mg/dL (ref 0.40–1.20)
GFR: 72.64 mL/min (ref >60.00)
Glucose, Bld: 90 mg/dL (ref 70–99)
Potassium: 4.9 mEq/L (ref 3.5–5.1)
Sodium: 137 mEq/L (ref 135–145)

## 2020-10-20 LAB — HEPATIC FUNCTION PANEL
ALT: 14 U/L (ref 0–35)
AST: 15 U/L (ref 0–37)
Albumin: 4.1 g/dL (ref 3.5–5.2)
Alkaline Phosphatase: 59 U/L (ref 39–117)
Bilirubin, Direct: 0.1 mg/dL (ref 0.0–0.3)
Total Bilirubin: 0.5 mg/dL (ref 0.2–1.2)
Total Protein: 6.7 g/dL (ref 6.0–8.3)

## 2020-10-20 MED ORDER — CEFDINIR 300 MG PO CAPS: 300.0000 mg | ORAL_CAPSULE | Freq: Two times a day (BID) | ORAL | 0 refills | Status: DC

## 2020-10-20 MED ORDER — AZITHROMYCIN 250 MG PO TABS: ORAL_TABLET | ORAL | 0 refills | Status: DC

## 2020-10-20 NOTE — Telephone Encounter (Signed)
Please see cxr results in epic. Radiology called to notify abnormal results.

## 2020-10-20 NOTE — Telephone Encounter (Signed)
Pt notified of cxr.  Will treat with omnicef and zpak.  Follow.  Will repeat cxr in 6-8 weeks to confirm clears.

## 2020-10-21 ENCOUNTER — Other Ambulatory Visit: Payer: Self-pay | Admitting: Internal Medicine

## 2020-10-21 DIAGNOSIS — R9389 Abnormal findings on diagnostic imaging of other specified body structures: Secondary | ICD-10-CM

## 2020-10-21 NOTE — Progress Notes (Signed)
Order placed for f/u cxr.  

## 2020-10-26 ENCOUNTER — Encounter: Payer: Self-pay | Admitting: Internal Medicine

## 2020-10-26 NOTE — Assessment & Plan Note (Signed)
Follow cbc.  

## 2020-10-26 NOTE — Assessment & Plan Note (Addendum)
Known CAD.  S/p PCI/DES - mid LAD.  Continues on metoprolol, imdur and lisinopril.  Currently with no chest pain.  Follow.

## 2020-10-26 NOTE — Assessment & Plan Note (Signed)
Blood pressure doing well on lisinopril, imdur and metoprolol.  Follow pressures.  Follow metabolic panel.

## 2020-10-26 NOTE — Assessment & Plan Note (Signed)
On lipitor.  Follow lipid panel and liver function tests.   

## 2020-10-26 NOTE — Assessment & Plan Note (Signed)
Symptoms have improved.  Breathing better.  Check cxr to confirm clear.

## 2020-10-26 NOTE — Assessment & Plan Note (Signed)
Has been on metformin.  Follow met b and a1c.

## 2020-11-16 ENCOUNTER — Inpatient Hospital Stay: Payer: Medicare Other

## 2020-11-16 ENCOUNTER — Inpatient Hospital Stay: Payer: Medicare Other | Admitting: Internal Medicine

## 2020-11-16 ENCOUNTER — Other Ambulatory Visit: Payer: Self-pay | Admitting: Physician Assistant

## 2020-11-23 ENCOUNTER — Other Ambulatory Visit: Payer: Self-pay

## 2020-11-23 ENCOUNTER — Ambulatory Visit (INDEPENDENT_AMBULATORY_CARE_PROVIDER_SITE_OTHER): Payer: Medicare Other

## 2020-11-23 DIAGNOSIS — E538 Deficiency of other specified B group vitamins: Secondary | ICD-10-CM | POA: Diagnosis not present

## 2020-11-23 MED ORDER — CYANOCOBALAMIN 1000 MCG/ML IJ SOLN
1000.0000 ug | Freq: Once | INTRAMUSCULAR | Status: AC
  Administered 2020-11-23: 1000 ug via INTRAMUSCULAR

## 2020-11-23 NOTE — Progress Notes (Signed)
Patient presented for B 12 injection to left deltoid, patient voiced no concerns nor showed any signs of distress during injection. 

## 2020-11-25 ENCOUNTER — Other Ambulatory Visit: Payer: Self-pay | Admitting: Internal Medicine

## 2020-11-27 ENCOUNTER — Telehealth: Payer: Self-pay

## 2020-11-27 NOTE — Telephone Encounter (Signed)
Pt called and states that her COPD is flared up and she needs something called in other than the inhalers. Please advise

## 2020-11-29 ENCOUNTER — Emergency Department: Payer: Medicare Other

## 2020-11-29 ENCOUNTER — Other Ambulatory Visit: Payer: Self-pay

## 2020-11-29 ENCOUNTER — Observation Stay
Admission: EM | Admit: 2020-11-29 | Discharge: 2020-11-30 | Disposition: A | Discharge status: Home / Self Care | Payer: Medicare Other | Attending: Internal Medicine | Admitting: Internal Medicine

## 2020-11-29 ENCOUNTER — Encounter: Payer: Self-pay | Admitting: Emergency Medicine

## 2020-11-29 DIAGNOSIS — R0602 Shortness of breath: Secondary | ICD-10-CM | POA: Diagnosis not present

## 2020-11-29 DIAGNOSIS — I2510 Atherosclerotic heart disease of native coronary artery without angina pectoris: Secondary | ICD-10-CM | POA: Insufficient documentation

## 2020-11-29 DIAGNOSIS — D72829 Elevated white blood cell count, unspecified: Secondary | ICD-10-CM | POA: Diagnosis not present

## 2020-11-29 DIAGNOSIS — Z7984 Long term (current) use of oral hypoglycemic drugs: Secondary | ICD-10-CM | POA: Diagnosis not present

## 2020-11-29 DIAGNOSIS — J45909 Unspecified asthma, uncomplicated: Secondary | ICD-10-CM | POA: Insufficient documentation

## 2020-11-29 DIAGNOSIS — Z7982 Long term (current) use of aspirin: Secondary | ICD-10-CM | POA: Insufficient documentation

## 2020-11-29 DIAGNOSIS — I110 Hypertensive heart disease with heart failure: Secondary | ICD-10-CM | POA: Diagnosis not present

## 2020-11-29 DIAGNOSIS — E119 Type 2 diabetes mellitus without complications: Secondary | ICD-10-CM | POA: Diagnosis not present

## 2020-11-29 DIAGNOSIS — E611 Iron deficiency: Secondary | ICD-10-CM | POA: Diagnosis not present

## 2020-11-29 DIAGNOSIS — R6889 Other general symptoms and signs: Secondary | ICD-10-CM | POA: Diagnosis not present

## 2020-11-29 DIAGNOSIS — E7800 Pure hypercholesterolemia, unspecified: Secondary | ICD-10-CM | POA: Diagnosis present

## 2020-11-29 DIAGNOSIS — I25118 Atherosclerotic heart disease of native coronary artery with other forms of angina pectoris: Secondary | ICD-10-CM | POA: Diagnosis not present

## 2020-11-29 DIAGNOSIS — I10 Essential (primary) hypertension: Secondary | ICD-10-CM | POA: Diagnosis not present

## 2020-11-29 DIAGNOSIS — Z743 Need for continuous supervision: Secondary | ICD-10-CM | POA: Diagnosis not present

## 2020-11-29 DIAGNOSIS — J441 Chronic obstructive pulmonary disease with (acute) exacerbation: Principal | ICD-10-CM | POA: Insufficient documentation

## 2020-11-29 DIAGNOSIS — I5032 Chronic diastolic (congestive) heart failure: Secondary | ICD-10-CM | POA: Diagnosis present

## 2020-11-29 DIAGNOSIS — I499 Cardiac arrhythmia, unspecified: Secondary | ICD-10-CM | POA: Diagnosis not present

## 2020-11-29 DIAGNOSIS — Z79899 Other long term (current) drug therapy: Secondary | ICD-10-CM | POA: Insufficient documentation

## 2020-11-29 DIAGNOSIS — R069 Unspecified abnormalities of breathing: Secondary | ICD-10-CM | POA: Diagnosis not present

## 2020-11-29 DIAGNOSIS — Z20822 Contact with and (suspected) exposure to covid-19: Secondary | ICD-10-CM | POA: Diagnosis not present

## 2020-11-29 DIAGNOSIS — J189 Pneumonia, unspecified organism: Secondary | ICD-10-CM | POA: Diagnosis present

## 2020-11-29 DIAGNOSIS — E1159 Type 2 diabetes mellitus with other circulatory complications: Secondary | ICD-10-CM

## 2020-11-29 LAB — SARS CORONAVIRUS 2 BY RT PCR (HOSPITAL ORDER, PERFORMED IN ~~LOC~~ HOSPITAL LAB): SARS Coronavirus 2: NEGATIVE

## 2020-11-29 LAB — COMPREHENSIVE METABOLIC PANEL
ALT: 18 U/L (ref 0–44)
AST: 20 U/L (ref 15–41)
Albumin: 4.3 g/dL (ref 3.5–5.0)
Alkaline Phosphatase: 76 U/L (ref 38–126)
Anion gap: 13 (ref 5–15)
BUN: 11 mg/dL (ref 8–23)
CO2: 23 mmol/L (ref 22–32)
Calcium: 9.5 mg/dL (ref 8.9–10.3)
Chloride: 105 mmol/L (ref 98–111)
Creatinine, Ser: 0.62 mg/dL (ref 0.44–1.00)
GFR, Estimated: >60 mL/min (ref >60)
Glucose, Bld: 150 mg/dL — ABNORMAL HIGH (ref 70–99)
Potassium: 3.9 mmol/L (ref 3.5–5.1)
Sodium: 141 mmol/L (ref 135–145)
Total Bilirubin: 1.1 mg/dL (ref 0.3–1.2)
Total Protein: 7.7 g/dL (ref 6.5–8.1)

## 2020-11-29 LAB — CBC
HCT: 45.6 % (ref 36.0–46.0)
Hemoglobin: 14.8 g/dL (ref 12.0–15.0)
MCH: 28.5 pg (ref 26.0–34.0)
MCHC: 32.5 g/dL (ref 30.0–36.0)
MCV: 87.7 fL (ref 80.0–100.0)
Platelets: 230 10*3/uL (ref 150–400)
RBC: 5.2 MIL/uL — ABNORMAL HIGH (ref 3.87–5.11)
RDW: 13.9 % (ref 11.5–15.5)
WBC: 13 10*3/uL — ABNORMAL HIGH (ref 4.0–10.5)
nRBC: 0 % (ref 0.0–0.2)

## 2020-11-29 LAB — CBG MONITORING, ED: Glucose-Capillary: 185 mg/dL — ABNORMAL HIGH (ref 70–99)

## 2020-11-29 LAB — BRAIN NATRIURETIC PEPTIDE: B Natriuretic Peptide: 67.5 pg/mL (ref 0.0–100.0)

## 2020-11-29 LAB — TROPONIN I (HIGH SENSITIVITY): Troponin I (High Sensitivity): 6 ng/L (ref <18)

## 2020-11-29 MED ORDER — INSULIN ASPART 100 UNIT/ML ~~LOC~~ SOLN: 0.0000 [IU] | Freq: Every day | SUBCUTANEOUS | Status: DC

## 2020-11-29 MED ORDER — ALBUTEROL SULFATE (2.5 MG/3ML) 0.083% IN NEBU
2.5000 mg | INHALATION_SOLUTION | RESPIRATORY_TRACT | Status: DC | PRN
  Administered 2020-11-30: 2.5 mg via RESPIRATORY_TRACT

## 2020-11-29 MED ORDER — DM-GUAIFENESIN ER 30-600 MG PO TB12
1.0000 | ORAL_TABLET | Freq: Two times a day (BID) | ORAL | Status: DC | PRN
  Administered 2020-11-30: 1 via ORAL
  Filled 2020-11-29: qty 1

## 2020-11-29 MED ORDER — INSULIN ASPART 100 UNIT/ML ~~LOC~~ SOLN
0.0000 [IU] | Freq: Three times a day (TID) | SUBCUTANEOUS | Status: DC
  Administered 2020-11-30: 2 [IU] via SUBCUTANEOUS
  Administered 2020-11-30: 5 [IU] via SUBCUTANEOUS
  Filled 2020-11-29 (×2): qty 1

## 2020-11-29 MED ORDER — ACETAMINOPHEN 325 MG PO TABS
650.0000 mg | ORAL_TABLET | Freq: Four times a day (QID) | ORAL | Status: DC | PRN
  Administered 2020-11-29: 650 mg via ORAL
  Filled 2020-11-29: qty 2

## 2020-11-29 MED ORDER — ATORVASTATIN CALCIUM 20 MG PO TABS: 80.0000 mg | ORAL_TABLET | Freq: Every day | ORAL | Status: DC

## 2020-11-29 MED ORDER — METOPROLOL SUCCINATE ER 50 MG PO TB24
50.0000 mg | ORAL_TABLET | Freq: Every day | ORAL | Status: DC
  Administered 2020-11-29 – 2020-11-30 (×2): 50 mg via ORAL
  Filled 2020-11-29 (×2): qty 1

## 2020-11-29 MED ORDER — ENOXAPARIN SODIUM 40 MG/0.4ML ~~LOC~~ SOLN
40.0000 mg | SUBCUTANEOUS | Status: DC
  Administered 2020-11-29 – 2020-11-30 (×2): 40 mg via SUBCUTANEOUS
  Filled 2020-11-29: qty 0.4

## 2020-11-29 MED ORDER — METHYLPREDNISOLONE SODIUM SUCC 40 MG IJ SOLR
20.0000 mg | Freq: Two times a day (BID) | INTRAMUSCULAR | Status: DC
  Administered 2020-11-29 – 2020-11-30 (×2): 20 mg via INTRAVENOUS
  Filled 2020-11-29 (×2): qty 1

## 2020-11-29 MED ORDER — AZITHROMYCIN 500 MG PO TABS
500.0000 mg | ORAL_TABLET | Freq: Every day | ORAL | Status: AC
  Administered 2020-11-29: 500 mg via ORAL
  Filled 2020-11-29: qty 1

## 2020-11-29 MED ORDER — FLUTICASONE PROPIONATE 50 MCG/ACT NA SUSP
2.0000 | Freq: Every day | NASAL | Status: DC
  Administered 2020-11-29 – 2020-11-30 (×2): 2 via NASAL
  Filled 2020-11-29: qty 16

## 2020-11-29 MED ORDER — IPRATROPIUM-ALBUTEROL 0.5-2.5 (3) MG/3ML IN SOLN
3.0000 mL | RESPIRATORY_TRACT | Status: DC
  Administered 2020-11-29 – 2020-11-30 (×6): 3 mL via RESPIRATORY_TRACT
  Filled 2020-11-29 (×5): qty 3

## 2020-11-29 MED ORDER — ISOSORBIDE MONONITRATE ER 60 MG PO TB24
30.0000 mg | ORAL_TABLET | Freq: Every day | ORAL | Status: DC
  Administered 2020-11-29 – 2020-11-30 (×2): 30 mg via ORAL
  Filled 2020-11-29 (×2): qty 1

## 2020-11-29 MED ORDER — ONDANSETRON HCL 4 MG PO TABS: 4.0000 mg | ORAL_TABLET | Freq: Four times a day (QID) | ORAL | Status: DC | PRN

## 2020-11-29 MED ORDER — AZITHROMYCIN 250 MG PO TABS
250.0000 mg | ORAL_TABLET | Freq: Every day | ORAL | Status: DC
  Administered 2020-11-30: 250 mg via ORAL
  Filled 2020-11-29: qty 1

## 2020-11-29 MED ORDER — LISINOPRIL 10 MG PO TABS
20.0000 mg | ORAL_TABLET | Freq: Every day | ORAL | Status: DC
  Administered 2020-11-29 – 2020-11-30 (×2): 20 mg via ORAL
  Filled 2020-11-29 (×2): qty 2

## 2020-11-29 MED ORDER — NITROGLYCERIN 0.4 MG SL SUBL: 0.4000 mg | SUBLINGUAL_TABLET | SUBLINGUAL | Status: DC | PRN

## 2020-11-29 MED ORDER — HYDRALAZINE HCL 20 MG/ML IJ SOLN: 5.0000 mg | INTRAMUSCULAR | Status: DC | PRN

## 2020-11-29 MED ORDER — ASPIRIN EC 81 MG PO TBEC
81.0000 mg | DELAYED_RELEASE_TABLET | Freq: Every day | ORAL | Status: DC
  Administered 2020-11-29 – 2020-11-30 (×2): 81 mg via ORAL
  Filled 2020-11-29 (×2): qty 1

## 2020-11-29 NOTE — ED Notes (Signed)
Urine with save label sent to lab

## 2020-11-29 NOTE — ED Notes (Signed)
Pt ambulated to/from toilet without assist, well tolerated

## 2020-11-29 NOTE — ED Notes (Signed)
Pt reports improved breathing. Given blanket and pillow per request. Denies further needs. Daughter at bedside

## 2020-11-29 NOTE — ED Provider Notes (Signed)
[facility] Emergency Department Provider Note  Time seen: 9:42 AM  I have reviewed the triage vital signs and the nursing notes.   HISTORY  Chief Complaint Shortness of Breath   HPI Tanya Cole is a 87 y.o. female with a past medical history of asthma, CAD, CHF, diabetes, hypertension, hyperlipidemia, COPD, presents to the emergency department for shortness of breath.  According to the patient yesterday she was having some shortness of breath but it seemed to get better over the night however this morning her shortness of breath worsened once again so the patient came to the emergency department via EMS.  EMS states initial room air saturation around 93% was given 2 breathing treatments and 125 mg of Solu-Medrol in route to the hospital.  Upon arrival patient appears well continues to have a mild wheeze but states she is feeling somewhat better.  Denies any chest pain.  No fever or increased cough.   Past Medical History:  Diagnosis Date  . Aortic insufficiency    a. TTE 12/19: EF 55-60%, probable HK of the mid apical anterior septal myocardium, Gr1DD, mild AI, mildly dilated LA  . Asthma   . CAD (coronary artery disease)    a. NSTEMI 12/19; b. LHC 10/08/18: LM minimal luminal irregs, mLAD-1 95% s/p PCI/DES, mLAD-2 60%, LCx mild diffuse disease throughout, RCA minimal luminal irregs; b. 03/2020 MV: EF>65%, no ischemia/scar.  . CHF (congestive heart failure) (HCC)   . Diabetes mellitus (HCC)   . Hypercholesterolemia   . Hypertension   . Myocardial infarction (HCC)   . Osteopenia   . Palpitations    a. 04/2020 Zio: Avg HR 75. 429 SVT episodes, longest 19 secs @ 133. Occas PACs (3.2%). Rare PVCs (<1%).  . Polymyalgia rheumatica syndrome (HCC)   . Reactive airway disease     Patient Active Problem List   Diagnosis Date Noted  . Rib pain on right side 10/19/2020  . Abnormal CXR 10/19/2020  . Type 2 diabetes mellitus without complication (HCC) 10/08/2020   . Elevated troponin 10/08/2020  . Acute on chronic heart failure with preserved ejection fraction (HFpEF) (HCC) 10/08/2020  . COPD with acute exacerbation (HCC) 08/19/2020  . History of non-ST elevation myocardial infarction (NSTEMI) 08/07/2020  . Asthma 08/07/2020  . Leukocytosis 08/07/2020  . Left shoulder pain 07/12/2020  . Iron deficiency 04/16/2020  . Anemia 04/04/2020  . SOB (shortness of breath) 03/25/2020  . Cough 09/30/2019  . Gallstone pancreatitis   . Pre-op evaluation 08/30/2019  . Cholecystitis 08/05/2019  . Choledocholithiasis   . Respiratory illness 06/18/2019  . Coronary artery disease 12/29/2018  . Chest pain 10/07/2018  . Memory change 05/27/2018  . Osteoporosis 02/05/2018  . Weight loss 06/24/2017  . Abdominal pain, left lower quadrant 10/05/2016  . Long term current use of systemic steroids 05/16/2016  . Elevated erythrocyte sedimentation rate 05/04/2016  . Back pain 04/29/2016  . Fatigue 05/24/2015  . Health care maintenance 01/25/2015  . UTI (urinary tract infection) 07/07/2014  . Neuropathy 03/24/2014  . Diverticulitis 02/24/2013  . Reactive airway disease 09/15/2012  . Hypertension 09/14/2012  . Hypercholesterolemia 09/14/2012  . Diabetes mellitus with cardiac complication (HCC) 09/14/2012    Past Surgical History:  Procedure Laterality Date  . ABDOMINAL HYSTERECTOMY  1981   prolapse and bleeding, ovaries not removed  . BREAST EXCISIONAL BIOPSY Right   . CHOLECYSTECTOMY N/A 09/02/2019   Procedure: LAPAROSCOPIC CHOLECYSTECTOMY WITH INTRAOPERATIVE CHOLANGIOGRAM;  Surgeon: Henrene Dodge, MD;  Location: [facility] ORS;  Service: General;  Laterality: N/A;  . CORONARY STENT INTERVENTION N/A 10/08/2018   Procedure: CORONARY STENT INTERVENTION;  Surgeon: Iran Ouch, MD;  Location: [facility] INVASIVE [facility];  Service: Cardiovascular;  Laterality: N/A;  . ENDOSCOPIC RETROGRADE CHOLANGIOPANCREATOGRAPHY (ERCP) WITH PROPOFOL N/A 08/08/2019   Procedure: ENDOSCOPIC  RETROGRADE CHOLANGIOPANCREATOGRAPHY (ERCP) WITH PROPOFOL;  Surgeon: Midge Minium, MD;  Location: [facility] ENDOSCOPY;  Service: Endoscopy;  Laterality: N/A;  . LEFT HEART CATH AND CORONARY ANGIOGRAPHY N/A 10/08/2018   Procedure: LEFT HEART CATH AND CORONARY ANGIOGRAPHY;  Surgeon: Iran Ouch, MD;  Location: [facility];  Service: Cardiovascular;  Laterality: N/A;  . LEFT HEART CATH AND CORONARY ANGIOGRAPHY N/A 08/10/2020   Procedure: LEFT HEART CATH AND CORONARY ANGIOGRAPHY possible percutaneous intervention;  Surgeon: Iran Ouch, MD;  Location: [facility] INV[facility] CV [facility];  Service: Cardiovascular;  Laterality: N/A;  . UMBILICAL HERNIA REPAIR  7/94    Prior to Admission medications   Medication Sig Start Date End Date Taking? Authorizing Provider  ADVAIR DISKUS 250-50 MCG/DOSE AEPB INHALE ONE PUFF BY MOUTH EVERY 12 HOURS. RINSE MOUTH AFTER EACH USE Patient taking differently: Inhale 1 puff into the lungs 2 (two) times daily. 02/17/16   Dale [place], MD  albuterol (PROVENTIL) (2.5 MG/3ML) 0.083% nebulizer solution USE 1 VIAL IN NEBULIZER EVERY 4 HOURS AS NEEDED FOR WHEEZING OR SHORTNESS OF BREATH 11/25/20   Dale Smallwood, MD  alendronate (FOSAMAX) 70 MG tablet Take 70 mg by mouth once a week.  11/06/17   [provider]  aspirin 81 MG tablet Take 81 mg by mouth daily.    [provider]  atorvastatin (LIPITOR) 80 MG tablet Take 1 tablet (80 mg total) by mouth daily at 6 PM. 04/01/20   Alver Sorrow, NP  azithromycin (ZITHROMAX) 250 MG tablet Take 2 tablets x 1 day and then one tablet per day for four more days. 10/20/20   Dale [place], MD  cefdinir (OMNICEF) 300 MG capsule Take 1 capsule (300 mg total) by mouth 2 (two) times daily. 10/20/20   Dale Nemacolin, MD  ferrous sulfate 325 (65 FE) MG tablet Take 325 mg by mouth once a week.     [provider]  fluticasone (FLONASE) 50 MCG/ACT nasal spray Use 2 spray(s) in each nostril once daily Patient taking  differently: Place 2 sprays into both nostrils daily. 08/03/20   Dale Lindy, MD  isosorbide mononitrate (IMDUR) 30 MG 24 hr tablet Take 1 tablet (30 mg total) by mouth daily. 08/11/20   Arnetha Courser, MD  lisinopril (ZESTRIL) 20 MG tablet Take 1 tablet by mouth once daily 11/17/20   Dunn, Raymon Mutton, PA-C  metFORMIN (GLUCOPHAGE) 500 MG tablet TAKE 1 TABLET BY MOUTH TWICE DAILY WITH A MEAL Patient taking differently: Take 500 mg by mouth 2 (two) times daily with a meal. 09/29/20   Dale Galt, MD  metoprolol succinate (TOPROL-XL) 50 MG 24 hr tablet Take 1 tablet (50 mg total) by mouth daily. Take with or immediately following a meal. 08/21/20   Rolly Salter, MD  metoprolol tartrate (LOPRESSOR) 25 MG tablet Take 1 tablet (25 mg total) by mouth daily as needed (if Heart rate is >120). 08/20/20   Rolly Salter, MD  Multiple Vitamins-Minerals (VISION FORMULA/LUTEIN PO) Take 1 tablet by mouth daily.     [provider]  Nebulizers (COMPRESSOR/NEBULIZER) MISC 1 Units by Does not apply route as directed. 04/07/20   Dionne Bucy, MD  nitroGLYCERIN (NITROSTAT) 0.4 MG SL tablet Place  1 tablet (0.4 mg total) under the tongue every 5 (five) minutes as needed for chest pain. 10/09/18   Adrian Saran, MD  PROAIR HFA 108 (90 Base) MCG/ACT inhaler Inhale 2 puffs into the lungs every 4 (four) hours as needed for wheezing or shortness of breath. 08/20/20   Rolly Salter, MD  Vitamin D, Ergocalciferol, (DRISDOL) 50000 units CAPS capsule Take 50,000 Units by mouth once a week. 06/24/18   [provider]    Allergies  Allergen Reactions  . Tramadol Itching and Nausea And Vomiting    Family History  Problem Relation Age of Onset  . Heart attack Father   . Arthritis Mother   . Heart disease Mother   . Throat cancer Sister   . Parkinson's disease Sister   . COPD Brother     Social History Social History   Tobacco Use  . Smoking status: Never Smoker  . Smokeless tobacco: Never  Used  Vaping Use  . Vaping Use: Never used  Substance Use Topics  . Alcohol use: No    Alcohol/week: 0.0 standard drinks  . Drug use: No    Review of Systems Constitutional: Negative for fever. Cardiovascular: Negative for chest pain. Respiratory: Positive for shortness of breath. Gastrointestinal: Negative for abdominal pain, vomiting and diarrhea. Musculoskeletal: Negative for musculoskeletal complaints Neurological: Negative for headache All other ROS negative  ____________________________________________   PHYSICAL EXAM:  Constitutional: Alert and oriented. Well appearing and in no distress. Eyes: Normal exam ENT      Head: Normocephalic and atraumatic.      Mouth/Throat: Mucous membranes are moist. Cardiovascular: Normal rate, regular rhythm. Respiratory: Mild tachypnea with mild expiratory wheezes bilaterally somewhat diminished breath sounds bilaterally.  Patient is moving decent amount of air, speaking in full sentences. Gastrointestinal: Soft and nontender. No distention. Musculoskeletal: Nontender with normal range of motion in all extremities.  Neurologic:  Normal speech and language. No gross focal neurologic deficits Skin:  Skin is warm, dry and intact.  Psychiatric: Mood and affect are normal.  ____________________________________________    EKG  EKG viewed and interpreted by myself shows normal sinus rhythm 88 bpm with a narrow QRS, normal axis, normal intervals, no concerning ST changes.  ____________________________________________    RADIOLOGY  Chest x-ray negative  ____________________________________________   INITIAL IMPRESSION / ASSESSMENT AND PLAN / ED COURSE  Pertinent labs & imaging results that were available during my care of the patient were reviewed by me and considered in my medical decision making (see chart for details).   Patient presents to the emergency department with shortness of breath that began yesterday and then  worsened again this morning.  Overall the patient appears well, no acute distress.  Does have mild wheezes bilaterally with somewhat diminished breath sounds bilaterally as well.  Denies any chest pain.  We will check labs including cardiac enzymes, chest x-ray, Covid swab.  We will continue to closely monitor while awaiting results.  Patient is already received 2 duo nebs and Solu-Medrol, last nebulizer finished shortly after arrival to the emergency department.  We will hold off on further nebulizer treatments at this time.  When sleeping patient's O2 sats dropped to around 90%, continues have a wheeze. Patient will be admitted to the hospital service for COPD exacerbation, placed on 2 L nasal cannula.  [name] Yonna Alwin was evaluated in Emergency Department on 11/29/2020 for the symptoms described in the history of present illness. She was evaluated in the context of the global COVID-19 pandemic, which  necessitated consideration that the patient might be at risk for infection with the SARS-CoV-2 virus that causes COVID-19. Institutional protocols and algorithms that pertain to the evaluation of patients at risk for COVID-19 are in a state of rapid change based on information released by regulatory bodies including the CDC and federal and state organizations. These policies and algorithms were followed during the patient's care in the ED.  ____________________________________________   FINAL CLINICAL IMPRESSION(S) / ED DIAGNOSES  COPD exacerbation   [name], MD 11/29/20 1515

## 2020-11-29 NOTE — ED Triage Notes (Signed)
Pt to ER via EMS from home with c/o [facility] that started last night. Pt has hx of COPD/CHF.  Pt states using home nebs without relief. EMS provided duoneb and 125mg  of solumedrol IV.  Pt states some better at this time.

## 2020-11-29 NOTE — H&P (Signed)
History and Physical    [name] LUN:[id number] DOB: [birth year] DOA: 11/29/2020  Referring MD/NP/PA:   PCP: Dale Lisle, MD   Patient coming from:  The patient is coming from home.    Chief Complaint: SOB  HPI: Tanya Cole is a 87 y.o. female with medical history significant of hypertension, hyperlipidemia, diabetes mellitus, COPD, asthma, PMR, CAD, myocardial infarction, dCHF, aortic valve insufficiency, iron deficiency anemia, who presents with shortness breath.  Patient states that her shortness breath started yesterday, but got better last night, however since this morning her shortness breath has been progressively worsening.  Patient has cough with little mucus production.  Denies fever, chills, chest pain.  Patient does not have nausea, vomiting, diarrhea, abdominal, symptoms of UTI.  Patient has oxygen desaturation to lower 90s.Per EMS, she was given 2 breathing treatments and 125 mg of Solu-Medrol in route to the hospital.     ED Course: pt was found to have negative Covid PCR, WBC 13.0, BNP 67.5, electrolytes renal function okay, temperature normal, blood pressure 161/72, heart rate 90, RR 18, oxygen saturation 97% on 2 L oxygen currently.  Chest x-ray negative.  Patient is placed on MedSurg bed of observation.  Review of Systems:   General: no fevers, chills, no body weight gain, has fatigue HEENT: no blurry vision, hearing changes or sore throat Respiratory: has dyspnea, coughing, wheezing CV: no chest pain, no palpitations GI: no nausea, vomiting, abdominal pain, diarrhea, constipation GU: no dysuria, burning on urination, increased urinary frequency, hematuria  Ext: no leg edema Neuro: no unilateral weakness, numbness, or tingling, no vision change or hearing loss Skin: no rash, no skin tear. MSK: No muscle spasm, no deformity, no limitation of range of movement in spin Heme: No easy bruising.  Travel history: No recent long distant  travel.  Allergy:  Allergies  Allergen Reactions  . Tramadol Itching and Nausea And Vomiting    Past Medical History:  Diagnosis Date  . Aortic insufficiency    a. TTE 12/19: EF 55-60%, probable HK of the mid apical anterior septal myocardium, Gr1DD, mild AI, mildly dilated LA  . Asthma   . CAD (coronary artery disease)    a. NSTEMI 12/19; b. LHC 10/08/18: LM minimal luminal irregs, mLAD-1 95% s/p PCI/DES, mLAD-2 60%, LCx mild diffuse disease throughout, RCA minimal luminal irregs; b. 03/2020 MV: EF>65%, no ischemia/scar.  . CHF (congestive heart failure) (HCC)   . Diabetes mellitus (HCC)   . Hypercholesterolemia   . Hypertension   . Myocardial infarction (HCC)   . Osteopenia   . Palpitations    a. 04/2020 Zio: Avg HR 75. 429 SVT episodes, longest 19 secs @ 133. Occas PACs (3.2%). Rare PVCs (<1%).  . Polymyalgia rheumatica syndrome (HCC)   . Reactive airway disease     Past Surgical History:  Procedure Laterality Date  . ABDOMINAL HYSTERECTOMY  1981   prolapse and bleeding, ovaries not removed  . BREAST EXCISIONAL BIOPSY Right   . CHOLECYSTECTOMY N/A 09/02/2019   Procedure: LAPAROSCOPIC CHOLECYSTECTOMY WITH INTRAOPERATIVE CHOLANGIOGRAM;  Surgeon: Henrene Dodge, MD;  Location: [facility] ORS;  Service: General;  Laterality: N/A;  . CORONARY STENT INTERVENTION N/A 10/08/2018   Procedure: CORONARY STENT INTERVENTION;  Surgeon: Iran Ouch, MD;  Location: [facility] INVASIVE CV LAB;  Service: Cardiovascular;  Laterality: N/A;  . ENDOSCOPIC RETROGRADE CHOLANGIOPANCREATOGRAPHY (ERCP) WITH PROPOFOL N/A 08/08/2019   Procedure: ENDOSCOPIC RETROGRADE CHOLANGIOPANCREATOGRAPHY (ERCP) WITH PROPOFOL;  Surgeon: Midge Minium, MD;  Location: [facility] ENDOSCOPY;  Service: Endoscopy;  Laterality: N/A;  . LEFT HEART CATH AND CORONARY ANGIOGRAPHY N/A 10/08/2018   Procedure: LEFT HEART CATH AND CORONARY ANGIOGRAPHY;  Surgeon: Iran Ouch, MD;  Location: [facility] INVASIVE CV LAB;  Service: Cardiovascular;   Laterality: N/A;  . LEFT HEART CATH AND CORONARY ANGIOGRAPHY N/A 08/10/2020   Procedure: LEFT HEART CATH AND CORONARY ANGIOGRAPHY possible percutaneous intervention;  Surgeon: Iran Ouch, MD;  Location: [facility] INVASIVE CV LAB;  Service: Cardiovascular;  Laterality: N/A;  . UMBILICAL HERNIA REPAIR  7/94    Social History:  reports that she has never smoked. She has never used smokeless tobacco. She reports that she does not drink alcohol and does not use drugs.  Family History:  Family History  Problem Relation Age of Onset  . Heart attack Father   . Arthritis Mother   . Heart disease Mother   . Throat cancer Sister   . Parkinson's disease Sister   . COPD Brother      Prior to Admission medications   Medication Sig Start Date End Date Taking? Authorizing Provider  ADVAIR DISKUS 250-50 MCG/DOSE AEPB INHALE ONE PUFF BY MOUTH EVERY 12 HOURS. RINSE MOUTH AFTER EACH USE Patient taking differently: Inhale 1 puff into the lungs 2 (two) times daily. 02/17/16   Dale [place], MD  albuterol (PROVENTIL) (2.5 MG/3ML) 0.083% nebulizer solution USE 1 VIAL IN NEBULIZER EVERY 4 HOURS AS NEEDED FOR WHEEZING OR SHORTNESS OF BREATH 11/25/20   Dale [place], MD  alendronate (FOSAMAX) 70 MG tablet Take 70 mg by mouth once a week.  11/06/17   [provider]  aspirin 81 MG tablet Take 81 mg by mouth daily.    [provider]  atorvastatin (LIPITOR) 80 MG tablet Take 1 tablet (80 mg total) by mouth daily at 6 PM. 04/01/20   Alver Sorrow, NP  azithromycin (ZITHROMAX) 250 MG tablet Take 2 tablets x 1 day and then one tablet per day for four more days. 10/20/20   Dale [place], MD  cefdinir (OMNICEF) 300 MG capsule Take 1 capsule (300 mg total) by mouth 2 (two) times daily. 10/20/20   Dale [place], MD  ferrous sulfate 325 (65 FE) MG tablet Take 325 mg by mouth once a week.     [provider]  fluticasone (FLONASE) 50 MCG/ACT nasal spray Use 2 spray(s) in each nostril once  daily Patient taking differently: Place 2 sprays into both nostrils daily. 08/03/20   Dale [place], MD  isosorbide mononitrate (IMDUR) 30 MG 24 hr tablet Take 1 tablet (30 mg total) by mouth daily. 08/11/20   Arnetha Courser, MD  lisinopril (ZESTRIL) 20 MG tablet Take 1 tablet by mouth once daily 11/17/20   Dunn, Raymon Mutton, PA-C  metFORMIN (GLUCOPHAGE) 500 MG tablet TAKE 1 TABLET BY MOUTH TWICE DAILY WITH A MEAL Patient taking differently: Take 500 mg by mouth 2 (two) times daily with a meal. 09/29/20   Dale , MD  metoprolol succinate (TOPROL-XL) 50 MG 24 hr tablet Take 1 tablet (50 mg total) by mouth daily. Take with or immediately following a meal. 08/21/20   Rolly Salter, MD  metoprolol tartrate (LOPRESSOR) 25 MG tablet Take 1 tablet (25 mg total) by mouth daily as needed (if Heart rate is >120). 08/20/20   Rolly Salter, MD  Multiple Vitamins-Minerals (VISION FORMULA/LUTEIN PO) Take 1 tablet by mouth daily.     [provider]  Nebulizers (COMPRESSOR/NEBULIZER) MISC 1 Units by Does not apply route as directed. 04/07/20   Siadecki,  Wilmon Pali, MD  nitroGLYCERIN (NITROSTAT) 0.4 MG SL tablet Place 1 tablet (0.4 mg total) under the tongue every 5 (five) minutes as needed for chest pain. 10/09/18   Adrian Saran, MD  PROAIR HFA 108 (90 Base) MCG/ACT inhaler Inhale 2 puffs into the lungs every 4 (four) hours as needed for wheezing or shortness of breath. 08/20/20   Rolly Salter, MD  Vitamin D, Ergocalciferol, (DRISDOL) 50000 units CAPS capsule Take 50,000 Units by mouth once a week. 06/24/18   [provider]    Physical Exam: Vitals:   11/29/20 0943 11/29/20 0947 11/29/20 1158  BP: (!) 161/72  (!) 149/69  Pulse: 90  89  Resp: 18  (!) 26  Temp: 98.1 F (36.7 C)    SpO2: 97%  97%  Weight:  53.5 kg   Height:   (1.549 m)    General: Not in acute distress HEENT:       Eyes: PERRL, EOMI, no scleral icterus.       ENT: No discharge from the ears and nose, no  pharynx injection, no tonsillar enlargement.        Neck: No JVD, no bruit, no mass felt. Heme: No neck lymph node enlargement. Cardiac: S1/S2, RRR, No murmurs, No gallops or rubs. Respiratory: has wheezing bilaterally GI: Soft, nondistended, nontender, no rebound pain, no organomegaly, BS present. GU: No hematuria Ext: No pitting leg edema bilaterally. 1+DP/PT pulse bilaterally. Musculoskeletal: No joint deformities, No joint redness or warmth, no limitation of ROM in spin. Skin: No rashes.  Neuro: Alert, oriented X3, cranial nerves II-XII grossly intact, moves all extremities normally. Psych: Patient is not psychotic, no suicidal or hemocidal ideation.  Labs on Admission: I have personally reviewed following labs and imaging studies  CBC: Recent Labs  Lab 11/29/20 0951  WBC 13.0*  HGB 14.8  HCT 45.6  MCV 87.7  PLT 230   Basic Metabolic Panel: Recent Labs  Lab 11/29/20 0951  NA 141  K 3.9  CL 105  CO2 23  GLUCOSE 150*  BUN 11  CREATININE 0.62  CALCIUM 9.5   GFR: Estimated Creatinine Clearance: 38.8 mL/min (by C-G formula based on SCr of 0.62 mg/dL). Liver Function Tests: Recent Labs  Lab 11/29/20 0951  AST 20  ALT 18  ALKPHOS 76  BILITOT 1.1  PROT 7.7  ALBUMIN 4.3   No results for input(s): LIPASE, AMYLASE in the last 168 hours. No results for input(s): AMMONIA in the last 168 hours. Coagulation Profile: No results for input(s): INR, PROTIME in the last 168 hours. Cardiac Enzymes: No results for input(s): CKTOTAL, CKMB, CKMBINDEX, TROPONINI in the last 168 hours. BNP (last 3 results) No results for input(s): PROBNP in the last 8760 hours. HbA1C: No results for input(s): HGBA1C in the last 72 hours. CBG: No results for input(s): GLUCAP in the last 168 hours. Lipid Profile: No results for input(s): CHOL, HDL, LDLCALC, TRIG, CHOLHDL, LDLDIRECT in the last 72 hours. Thyroid Function Tests: No results for input(s): TSH, T4TOTAL, FREET4, T3FREE, THYROIDAB  in the last 72 hours. Anemia Panel: No results for input(s): VITAMINB12, FOLATE, FERRITIN, TIBC, IRON, RETICCTPCT in the last 72 hours. Urine analysis:    Component Value Date/Time   COLORURINE YELLOW (A) 08/07/2020 1212   APPEARANCEUR CLEAR (A) 08/07/2020 1212   APPEARANCEUR Clear 02/18/2013 2004   LABSPEC 1.011 08/07/2020 1212   LABSPEC 1.024 02/18/2013 2004   PHURINE 6.0 08/07/2020 1212   GLUCOSEU NEGATIVE 08/07/2020 1212   GLUCOSEU NEGATIVE 01/28/2015 0810  HGBUR NEGATIVE 08/07/2020 1212   BILIRUBINUR NEGATIVE 08/07/2020 1212   BILIRUBINUR neg 01/16/2015 1437   BILIRUBINUR Negative 02/18/2013 2004   KETONESUR NEGATIVE 08/07/2020 1212   PROTEINUR 100 (A) 08/07/2020 1212   UROBILINOGEN 0.2 01/28/2015 0810   NITRITE NEGATIVE 08/07/2020 1212   LEUKOCYTESUR NEGATIVE 08/07/2020 1212   LEUKOCYTESUR Trace 02/18/2013 2004   Sepsis Labs: (procalcitonin:4,lacticidven:4) ) Recent Results (from the past 240 hour(s))  SARS Coronavirus 2 by RT PCR (hospital order, performed in [facility] hospital lab) Nasopharyngeal Nasopharyngeal Swab     Status: None   Collection Time: 11/29/20  9:51 AM   Specimen: Nasopharyngeal Swab  Result Value Ref Range Status   SARS Coronavirus 2 NEGATIVE NEGATIVE Final    Comment: (NOTE) SARS-CoV-2 target nucleic acids are NOT DETECTED.  The SARS-CoV-2 RNA is generally detectable in upper and lower respiratory specimens during the acute phase of infection. The lowest concentration of SARS-CoV-2 viral copies this assay can detect is 250 copies / mL. A negative result does not preclude SARS-CoV-2 infection and should not be used as the sole basis for treatment or other patient management decisions.  A negative result may occur with improper specimen collection / handling, submission of specimen other than nasopharyngeal swab, presence of viral mutation(s) within the areas targeted by this assay, and inadequate number of viral copies (<250 copies  / mL). A negative result must be combined with clinical observations, patient history, and epidemiological information.  Fact Sheet for Patients:   BoilerBrush.com.cy  Fact Sheet for Healthcare Providers: https://pope.com/  This test is not yet approved or  cleared by the [place] FDA and has been authorized for detection and/or diagnosis of SARS-CoV-2 by FDA under an Emergency Use Authorization (EUA).  This EUA will remain in effect (meaning this test can be used) for the duration of the COVID-19 declaration under Section 564(b)(1) of the Act, 21 U.S.C. section 360bbb-3(b)(1), unless the authorization is terminated or revoked sooner.  Performed at [facility], [address] [place], [place] [address]      Radiological Exams on Admission: DG Chest Portable 1 View  Result Date: 11/29/2020 CLINICAL DATA:  Shortness of breath EXAM: PORTABLE CHEST 1 VIEW COMPARISON:  10/19/2020 radiograph and 08/19/2020 chest CT FINDINGS: Chronic interstitial coarsening. There is no edema, consolidation, effusion, or pneumothorax. Chronic indistinct opacity in the region of the lingula with nodular calcification. Normal heart size and mediastinal contours. Aortic atherosclerosis. IMPRESSION: Stable exam.  No evidence of acute disease. Electronically Signed   By: Marnee Spring M.D.   On: 11/29/2020 10:14     EKG: I have personally reviewed.  Sinus rhythm, QTC 457, nonspecific T wave change  Assessment/Plan Principal Problem:   COPD exacerbation (HCC) Active Problems:   Hypertension   Hypercholesterolemia   Diabetes mellitus with cardiac complication (HCC)   Coronary artery disease   Iron deficiency   Chronic diastolic CHF (congestive heart failure) (HCC)   COPD exacerbation (HCC): Patient has productive cough, worsening shortness breath, wheezing on lung auscultation, clinically consistent with COPD exacerbation.    -will place  to med-surg bed for observation -Bronchodilators -Solu-Medrol 20 mg IV bid -Z pak  -Mucinex for cough  -Incentive spirometry -Follow up sputum culture -Nasal cannula oxygen as needed to maintain O2 saturation 93% or greater  Hypertension -IV hydralazine as needed -Lisinopril, metoprolol  Hypercholesterolemia -Lipitor  Diabetes mellitus with cardiac complication [facility]): Recent A1c 6.3, well controlled.  Patient is taking Metformin -Sliding scale insulin  Hx of Coronary artery  disease: no CP. -ASA and lipitor, Imdur -As needed nitroglycerin  Iron deficiency aemia: Hgb 14.8 -Continue home iron supplement  Chronic diastolic CHF (congestive heart failure) (HCC): Patient does not have leg edema, BNP normal, no pulmonary edema by chest x-ray, no CHF exacerbation.  Patient is not taking diuretics.  2D echo on 08/08/2020 showed EF of 60-65% with grade 2 diastolic dysfunction. -Watch volume status closely -on ASA, lisinopril, imdur, metoprolol            DVT ppx:  SQ Lovenox Code Status: DNR per patient and her daughter Family Communication:  Yes, patient's daughter at bed side Disposition Plan:  Anticipate discharge back to previous environment Consults called: None Admission status and Level of care: :    Med-surg bed for obs  Status is: Observation  The patient remains OBS appropriate and will d/c before 2 midnights.  Dispo: The patient is from: Home              Anticipated d/c is to: Home              Anticipated d/c date is: 1 day              Patient currently is not medically stable to d/c.   Difficult to place patient No         Date of Service 11/29/2020    Lorretta Harp [facility]   If 7PM-7AM, please contact night-coverage [email or web address] 11/29/2020, 1:00 PM

## 2020-11-30 DIAGNOSIS — I10 Essential (primary) hypertension: Secondary | ICD-10-CM | POA: Diagnosis not present

## 2020-11-30 DIAGNOSIS — J441 Chronic obstructive pulmonary disease with (acute) exacerbation: Secondary | ICD-10-CM | POA: Diagnosis not present

## 2020-11-30 DIAGNOSIS — E1159 Type 2 diabetes mellitus with other circulatory complications: Secondary | ICD-10-CM | POA: Diagnosis not present

## 2020-11-30 LAB — CBG MONITORING, ED
Glucose-Capillary: 186 mg/dL — ABNORMAL HIGH (ref 70–99)
Glucose-Capillary: 263 mg/dL — ABNORMAL HIGH (ref 70–99)

## 2020-11-30 MED ORDER — AZITHROMYCIN 250 MG PO TABS: 250.0000 mg | ORAL_TABLET | Freq: Every day | ORAL | 0 refills | Status: AC

## 2020-11-30 MED ORDER — PREDNISONE 20 MG PO TABS: 40.0000 mg | ORAL_TABLET | Freq: Every day | ORAL | 0 refills | Status: AC

## 2020-11-30 NOTE — Evaluation (Signed)
Physical Therapy Evaluation Patient Details Name: Tanya Cole MRN: [id number] DOB: 1933-10-25 Today's Date: 11/30/2020   History of Present Illness  Pt is a 87 y.o. female with medical history significant of hypertension, hyperlipidemia, diabetes mellitus, COPD, asthma, PMR, CAD, myocardial infarction, dCHF, aortic valve insufficiency, iron deficiency anemia, who presents with shortness breath.    Clinical Impression  Patient alert, in bed MD at bedside. Pt reported that she lives alone at baseline, independent but does not drive, no falls to report.  The patient demonstrated bed mobility modI, and transfers modI as well (hand support on bed when transferring). She ambulated ~137ft with supervision. She was able to ambulate and speak to PT throughout, mild SOB noted towards end of ambulation, but on room air throughout, lowest spO2 reading 93%. Overall the patient demonstrated mild deficits from PLOF including activity tolerance and endurance, she would benefit from HHPT (she's homebound) to maximize safety, independence, and endurance.     Follow Up Recommendations Home health PT;Supervision - Intermittent    Equipment Recommendations  None recommended by PT    Recommendations for Other Services       Precautions / Restrictions Precautions Precautions: Fall Precaution Comments: low fall Restrictions Weight Bearing Restrictions: No      Mobility  Bed Mobility Overal bed mobility: Modified Independent                  Transfers Overall transfer level: Modified independent Equipment used: None                Ambulation/Gait   Gait Distance (Feet): 100 Feet Assistive device: None       General Gait Details: 1-2 instances of gait path deviations noted, but pt  ambulatory in room with many turns. Able to ambulate and speak to PT ~150ft. Some mild SOB noted and pt endorsed feeling a little bit SOB but much better than admission  Stairs             Wheelchair Mobility    Modified Rankin (Stroke Patients Only)       Balance Overall balance assessment: Needs assistance Sitting-balance support: Feet supported Sitting balance-Leahy Scale: Normal     Standing balance support: No upper extremity supported;During functional activity Standing balance-Leahy Scale: Good                               Pertinent Vitals/Pain Pain Assessment: No/denies pain    Home Living Family/patient expects to be discharged to:: Private residence Living Arrangements: Alone Available Help at Discharge: Family;Available PRN/intermittently Type of Home: House Home Access: Stairs to enter Entrance Stairs-Rails: None Entrance Stairs-Number of Steps: 2 Home Layout: One level Home Equipment: Walker - 2 wheels;Cane - single point;Shower seat      Prior Function Level of Independence: Independent         Comments: pt reported no falls in the last 6 months. primarily does sink baths. does not drive     Hand Dominance   Dominant Hand: Right    Extremity/Trunk Assessment   Upper Extremity Assessment Upper Extremity Assessment: Overall WFL for tasks assessed    Lower Extremity Assessment Lower Extremity Assessment: Generalized weakness (able to lift against gravity independently)    Cervical / Trunk Assessment Cervical / Trunk Assessment: Normal  Communication   Communication: No difficulties  Cognition Arousal/Alertness: Awake/alert Behavior During Therapy: WFL for tasks assessed/performed Overall Cognitive Status: Within Functional Limits for tasks assessed  General Comments      Exercises     Assessment/Plan    PT Assessment Patient needs continued PT services  PT Problem List Decreased strength;Decreased mobility;Decreased range of motion;Decreased activity tolerance;Decreased balance       PT Treatment Interventions DME instruction;Therapeutic  exercise;Gait training;Balance training;Stair training;Neuromuscular re-education;Patient/family education;Functional mobility training;Therapeutic activities    PT Goals (Current goals can be found in the Care Plan section)  Acute Rehab PT Goals Patient Stated Goal: to go home PT Goal Formulation: With patient Time For Goal Achievement: 12/14/20 Potential to Achieve Goals: Good    Frequency Min 2X/week   Barriers to discharge        Co-evaluation               AM-PAC PT "6 Clicks" Mobility  Outcome Measure Help needed turning from your back to your side while in a flat bed without using bedrails?: None Help needed moving from lying on your back to sitting on the side of a flat bed without using bedrails?: None Help needed moving to and from a bed to a chair (including a wheelchair)?: None Help needed standing up from a chair using your arms (e.g., wheelchair or bedside chair)?: None Help needed to walk in hospital room?: A Little Help needed climbing 3-5 steps with a railing? : A Little 6 Click Score: 22    End of Session   Activity Tolerance: Patient tolerated treatment well Patient left: in bed;with call bell/phone within reach Nurse Communication: Mobility status PT Visit Diagnosis: Muscle weakness (generalized) (M62.81);Difficulty in walking, not elsewhere classified (R26.2)    Time: 4431-5400 PT Time Calculation (min) (ACUTE ONLY): 19 min   Charges:   PT Evaluation $PT Eval Low Complexity: 1 Low         Olga Coaster PT, DPT 10:48 AM,11/30/20

## 2020-11-30 NOTE — Progress Notes (Signed)
OT Cancellation Note  Patient Details Name: Tanya Cole MRN: [id number] DOB: 1933/01/14   Cancelled Treatment:    Reason Eval/Treat Not Completed: OT screened, no needs identified, will sign off. Pt ambulated with supervision and functional transfers performed at mod I level with physical therapist. Pt with no skilled need for OT services at this time. OT to SIGN OFF.   Jackquline Denmark, MS, OTR/L , CBIS ascom [phone]  11/30/20, 2:19 PM   11/30/2020, 2:19 PM

## 2020-11-30 NOTE — ED Notes (Signed)
sheduled meds given , lunch provided.

## 2020-11-30 NOTE — ED Notes (Signed)
Took over care of pt. Pt resting comfortably. Pt in NAD at this time. VSS. Awaiting further orders. Will continue to monitor.  

## 2020-11-30 NOTE — ED Notes (Signed)
meds provided as per patient request and routine at home, bg 187 coverage provided and sliding scale covered.

## 2020-11-30 NOTE — Discharge Summary (Signed)
Physician Discharge Summary  Tanya Cole LKG:[id number] DOB: 09-Jun-1933 DOA: 11/29/2020  PCP: Dale Attala, MD  Admit date: 11/29/2020 Discharge date: 11/30/2020  Admitted From: home  Disposition: home w/ home health   Recommendations for Outpatient Follow-up:  1. Follow up with PCP in 1-2 weeks 2. F/u w/ pulmon in 1 week   Home Health: yes Equipment/Devices:  Discharge Condition: stable  CODE STATUS: DNR  Diet recommendation: Heart Healthy / Carb Modified   Brief/Interim Summary: HPI was taken from Dr. Clyde Lundborg: Tanya Cole is a 87 y.o. female with medical history significant of hypertension, hyperlipidemia, diabetes mellitus, COPD, asthma, PMR, CAD, myocardial infarction, dCHF, aortic valve insufficiency, iron deficiency anemia, who presents with shortness breath.  Patient states that her shortness breath started yesterday, but got better last night, however since this morning her shortness breath has been progressively worsening.  Patient has cough with little mucus production.  Denies fever, chills, chest pain.  Patient does not have nausea, vomiting, diarrhea, abdominal, symptoms of UTI.  Patient has oxygen desaturation to lower 90s.Per EMS, she was given 2 breathing treatments and 125 mg of Solu-Medrol in route to the hospital.    ED Course: pt was found to have negative Covid PCR, WBC 13.0, BNP 67.5, electrolytes renal function okay, temperature normal, blood pressure 161/72, heart rate 90, RR 18, oxygen saturation 97% on 2 L oxygen currently.  Chest x-ray negative.  Patient is placed on MedSurg bed of observation.  Hospital Course from Dr. Mayford Knife 11/30/20: Pt presented w/ COPD exacerbation and was treated w/ azithromycin, steroids, bronchodilators, incentive spirometry and supplemental oxygen. The supplemental oxygen was able to be weaned off prior to d/c. Pt was sent home w/ po azithromycin & steroids to finish the course. PT evaluated the pt and recommended home  health. Home health was set up by CM prior to d/c.   Discharge Diagnoses:  Principal Problem:   COPD exacerbation (HCC) Active Problems:   Hypertension   Hypercholesterolemia   Diabetes mellitus with cardiac complication (HCC)   Coronary artery disease   Iron deficiency   Chronic diastolic CHF (congestive heart failure) (HCC)   CAP (community acquired pneumonia)  COPD exacerbation:  much improved. Continue on azithromycin,  steroids, bronchodilators. Encourage incentive spirometry. Mucinex prn. Weaned off of supplemental oxygen   HTN: continue on home dose of metoprolol, lisinopril. IV hydralazine prn   HLD: continue on statin   DM2: well controlled w/ HbA1c 6.3. Continue to hold home dose of metformin. Continue on SSI w/ accuchecks   Hx of CAD: continue on aspirin, statin & imdur.   IDA: continue on iron supplement   Chronic diastolic CHF : appears euvolemic.  Patient is not taking diuretics.  Echo on 08/08/2020 showed EF of 60-65% with grade 2 diastolic dysfunction. Continue on metoprolol, lisinopril, aspirin. Monitor I/Os  Discharge Instructions   Allergies as of 11/30/2020      Reactions   Tramadol Itching, Nausea And Vomiting      Medication List    STOP taking these medications   cefdinir 300 MG capsule Commonly known as: OMNICEF     TAKE these medications   Advair Diskus 250-50 MCG/DOSE Aepb Generic drug: Fluticasone-Salmeterol INHALE ONE PUFF BY MOUTH EVERY 12 HOURS. RINSE MOUTH AFTER EACH USE What changed: See the new instructions.   alendronate 70 MG tablet Commonly known as: FOSAMAX Take 70 mg by mouth once a week.   aspirin 81 MG tablet Take 81 mg by mouth daily.   atorvastatin 80  MG tablet Commonly known as: LIPITOR Take 1 tablet (80 mg total) by mouth daily at 6 PM.   azithromycin 250 MG tablet Commonly known as: ZITHROMAX Take 1 tablet (250 mg total) by mouth daily for 4 days. What changed:   how much to take  how to take  this  when to take this  additional instructions   Compressor/Nebulizer Misc 1 Units by Does not apply route as directed.   fluticasone 50 MCG/ACT nasal spray Commonly known as: FLONASE Use 2 spray(s) in each nostril once daily What changed: See the new instructions.   isosorbide mononitrate 30 MG 24 hr tablet Commonly known as: IMDUR Take 1 tablet (30 mg total) by mouth daily.   lisinopril 20 MG tablet Commonly known as: ZESTRIL Take 1 tablet by mouth once daily   metFORMIN 500 MG tablet Commonly known as: GLUCOPHAGE TAKE 1 TABLET BY MOUTH TWICE DAILY WITH A MEAL What changed: See the new instructions.   metoprolol succinate 50 MG 24 hr tablet Commonly known as: TOPROL-XL Take 1 tablet (50 mg total) by mouth daily. Take with or immediately following a meal.   metoprolol tartrate 25 MG tablet Commonly known as: LOPRESSOR Take 1 tablet (25 mg total) by mouth daily as needed (if Heart rate is >120).   nitroGLYCERIN 0.4 MG SL tablet Commonly known as: NITROSTAT Place 1 tablet (0.4 mg total) under the tongue every 5 (five) minutes as needed for chest pain.   predniSONE 20 MG tablet Commonly known as: DELTASONE Take 2 tablets (40 mg total) by mouth daily for 5 days.   ProAir HFA 108 (90 Base) MCG/ACT inhaler Generic drug: albuterol Inhale 2 puffs into the lungs every 4 (four) hours as needed for wheezing or shortness of breath. What changed: Another medication with the same name was changed. Make sure you understand how and when to take each.   albuterol (2.5 MG/3ML) 0.083% nebulizer solution Commonly known as: PROVENTIL USE 1 VIAL IN NEBULIZER EVERY 4 HOURS AS NEEDED FOR WHEEZING OR SHORTNESS OF BREATH What changed: See the new instructions.   Vitamin D (Ergocalciferol) 1.25 MG (50000 UNIT) Caps capsule Commonly known as: DRISDOL Take 50,000 Units by mouth once a week.       Allergies  Allergen Reactions  . Tramadol Itching and Nausea And Vomiting     Consultations:     Procedures/Studies: DG Chest Portable 1 View  Result Date: 11/29/2020 CLINICAL DATA:  Shortness of breath EXAM: PORTABLE CHEST 1 VIEW COMPARISON:  10/19/2020 radiograph and 08/19/2020 chest CT FINDINGS: Chronic interstitial coarsening. There is no edema, consolidation, effusion, or pneumothorax. Chronic indistinct opacity in the region of the lingula with nodular calcification. Normal heart size and mediastinal contours. Aortic atherosclerosis. IMPRESSION: Stable exam.  No evidence of acute disease. Electronically Signed   By: Marnee Spring M.D.   On: 11/29/2020 10:14      Subjective: Pt states her shortness of breath has improved & feels better. Pt denies any pain    Discharge Exam: Vitals:   11/30/20 1030 11/30/20 1048  BP: 111/67 111/67  Pulse: 78 78  Resp:  17  Temp:  98.4 F (36.9 C)  SpO2: 93% 95%   Vitals:   11/30/20 0607 11/30/20 0730 11/30/20 1030 11/30/20 1048  BP: 121/79 124/64 111/67 111/67  Pulse: 76 72 78 78  Resp: 16   17  Temp:    98.4 F (36.9 C)  TempSrc:    Oral  SpO2: 94% 94% 93% 95%  Weight:  Height:        General: Pt is alert, awake, not in acute distress Cardiovascular: S1/S2 +, no rubs, no gallops Respiratory: diminished breath sounds b/l otherwise clear Abdominal: Soft, NT, ND, bowel sounds + Extremities: no edema, no cyanosis    The results of significant diagnostics from this hospitalization (including imaging, microbiology, ancillary and laboratory) are listed below for reference.     Microbiology: Recent Results (from the past 240 hour(s))  SARS Coronavirus 2 by RT PCR (hospital order, performed in [facility] hospital lab) Nasopharyngeal Nasopharyngeal Swab     Status: None   Collection Time: 11/29/20  9:51 AM   Specimen: Nasopharyngeal Swab  Result Value Ref Range Status   SARS Coronavirus 2 NEGATIVE NEGATIVE Final    Comment: (NOTE) SARS-CoV-2 target nucleic acids are NOT DETECTED.  The  SARS-CoV-2 RNA is generally detectable in upper and lower respiratory specimens during the acute phase of infection. The lowest concentration of SARS-CoV-2 viral copies this assay can detect is 250 copies / mL. A negative result does not preclude SARS-CoV-2 infection and should not be used as the sole basis for treatment or other patient management decisions.  A negative result may occur with improper specimen collection / handling, submission of specimen other than nasopharyngeal swab, presence of viral mutation(s) within the areas targeted by this assay, and inadequate number of viral copies (<250 copies / mL). A negative result must be combined with clinical observations, patient history, and epidemiological information.  Fact Sheet for Patients:   BoilerBrush.com.cy  Fact Sheet for Healthcare Providers: https://pope.com/  This test is not yet approved or  cleared by the [place] FDA and has been authorized for detection and/or diagnosis of SARS-CoV-2 by FDA under an Emergency Use Authorization (EUA).  This EUA will remain in effect (meaning this test can be used) for the duration of the COVID-19 declaration under Section 564(b)(1) of the Act, 21 U.S.C. section 360bbb-3(b)(1), unless the authorization is terminated or revoked sooner.  Performed at [facility], [address] [place], [place] [address]      Labs: BNP (last 3 results) Recent Labs    08/07/20 1135 10/08/20 2112 11/29/20 0951  BNP 259.6* 238.3* 67.5   Basic Metabolic Panel: Recent Labs  Lab 11/29/20 0951  NA 141  K 3.9  CL 105  CO2 23  GLUCOSE 150*  BUN 11  CREATININE 0.62  CALCIUM 9.5   Liver Function Tests: Recent Labs  Lab 11/29/20 0951  AST 20  ALT 18  ALKPHOS 76  BILITOT 1.1  PROT 7.7  ALBUMIN 4.3   No results for input(s): LIPASE, AMYLASE in the last 168 hours. No results for input(s): AMMONIA in the last 168  hours. CBC: Recent Labs  Lab 11/29/20 0951  WBC 13.0*  HGB 14.8  HCT 45.6  MCV 87.7  PLT 230   Cardiac Enzymes: No results for input(s): CKTOTAL, CKMB, CKMBINDEX, TROPONINI in the last 168 hours. BNP: Invalid input(s): POCBNP CBG: Recent Labs  Lab 11/29/20 2135 11/30/20 0807 11/30/20 1141  GLUCAP 185* 186* 263*   D-Dimer No results for input(s): DDIMER in the last 72 hours. Hgb A1c No results for input(s): HGBA1C in the last 72 hours. Lipid Profile No results for input(s): CHOL, HDL, LDLCALC, TRIG, CHOLHDL, LDLDIRECT in the last 72 hours. Thyroid function studies No results for input(s): TSH, T4TOTAL, T3FREE, THYROIDAB in the last 72 hours.  Invalid input(s): FREET3 Anemia work up No results for input(s): VITAMINB12, FOLATE, FERRITIN, TIBC, IRON,  RETICCTPCT in the last 72 hours. Urinalysis    Component Value Date/Time   COLORURINE YELLOW (A) 08/07/2020 1212   APPEARANCEUR CLEAR (A) 08/07/2020 1212   APPEARANCEUR Clear 02/18/2013 2004   LABSPEC 1.011 08/07/2020 1212   LABSPEC 1.024 02/18/2013 2004   PHURINE 6.0 08/07/2020 1212   GLUCOSEU NEGATIVE 08/07/2020 1212   GLUCOSEU NEGATIVE 01/28/2015 0810   HGBUR NEGATIVE 08/07/2020 1212   BILIRUBINUR NEGATIVE 08/07/2020 1212   BILIRUBINUR neg 01/16/2015 1437   BILIRUBINUR Negative 02/18/2013 2004   KETONESUR NEGATIVE 08/07/2020 1212   PROTEINUR 100 (A) 08/07/2020 1212   UROBILINOGEN 0.2 01/28/2015 0810   NITRITE NEGATIVE 08/07/2020 1212   LEUKOCYTESUR NEGATIVE 08/07/2020 1212   LEUKOCYTESUR Trace 02/18/2013 2004   Sepsis Labs Invalid input(s): PROCALCITONIN,  WBC,  LACTICIDVEN Microbiology Recent Results (from the past 240 hour(s))  SARS Coronavirus 2 by RT PCR (hospital order, performed in [facility] hospital lab) Nasopharyngeal Nasopharyngeal Swab     Status: None   Collection Time: 11/29/20  9:51 AM   Specimen: Nasopharyngeal Swab  Result Value Ref Range Status   SARS Coronavirus 2 NEGATIVE NEGATIVE Final     Comment: (NOTE) SARS-CoV-2 target nucleic acids are NOT DETECTED.  The SARS-CoV-2 RNA is generally detectable in upper and lower respiratory specimens during the acute phase of infection. The lowest concentration of SARS-CoV-2 viral copies this assay can detect is 250 copies / mL. A negative result does not preclude SARS-CoV-2 infection and should not be used as the sole basis for treatment or other patient management decisions.  A negative result may occur with improper specimen collection / handling, submission of specimen other than nasopharyngeal swab, presence of viral mutation(s) within the areas targeted by this assay, and inadequate number of viral copies (<250 copies / mL). A negative result must be combined with clinical observations, patient history, and epidemiological information.  Fact Sheet for Patients:   BoilerBrush.com.cy  Fact Sheet for Healthcare Providers: https://pope.com/  This test is not yet approved or  cleared by the [place] FDA and has been authorized for detection and/or diagnosis of SARS-CoV-2 by FDA under an Emergency Use Authorization (EUA).  This EUA will remain in effect (meaning this test can be used) for the duration of the COVID-19 declaration under Section 564(b)(1) of the Act, 21 U.S.C. section 360bbb-3(b)(1), unless the authorization is terminated or revoked sooner.  Performed at [facility], [address] [place], [place] [address]      Time coordinating discharge: Over 30 minutes  SIGNED:   Charise Killian, MD  [facility] 11/30/2020, 2:06 PM Pager   If 7PM-7AM, please contact night-coverage

## 2020-11-30 NOTE — ED Notes (Signed)
This RN assigned pt to herself and was told by Para March, RN that pt will be getting discharged. This RN was told by Chrissie Noa, MD that it was okay to discharge her at this time.

## 2020-11-30 NOTE — Telephone Encounter (Signed)
Per chart, patient has been admitted to hospital.

## 2020-11-30 NOTE — TOC Transition Note (Addendum)
Transition of Care [facility]) - CM/SW Discharge Note   Patient Details  Name: Buena Boehm MRN: [id number] Date of Birth: 04/15/1933  Transition of Care [facility]) CM/SW Contact:  Marina Goodell Phone Number:  [phone] 11/30/2020, 1:33 PM   Clinical Narrative:     Patient will d/c home with PT, OT, RN home health, [facility] will contact patient's daughter Ezra Sites (Daughter) [phone] to set up home health.  Patient's son Shakinah Navis [phone], will pick up the patient from ED.  Attending and ED RN updated. TOC consult completed.        Patient Goals and CMS Choice        Discharge Placement                       Discharge Plan and Services                                     Social Determinants of Health (SDOH) Interventions     Readmission Risk Interventions No flowsheet data found.

## 2020-12-01 ENCOUNTER — Telehealth: Payer: Self-pay

## 2020-12-01 DIAGNOSIS — I2510 Atherosclerotic heart disease of native coronary artery without angina pectoris: Secondary | ICD-10-CM | POA: Diagnosis not present

## 2020-12-01 DIAGNOSIS — J441 Chronic obstructive pulmonary disease with (acute) exacerbation: Secondary | ICD-10-CM | POA: Diagnosis not present

## 2020-12-01 DIAGNOSIS — E1142 Type 2 diabetes mellitus with diabetic polyneuropathy: Secondary | ICD-10-CM | POA: Diagnosis not present

## 2020-12-01 DIAGNOSIS — E1169 Type 2 diabetes mellitus with other specified complication: Secondary | ICD-10-CM | POA: Diagnosis not present

## 2020-12-01 DIAGNOSIS — I252 Old myocardial infarction: Secondary | ICD-10-CM | POA: Diagnosis not present

## 2020-12-01 DIAGNOSIS — E785 Hyperlipidemia, unspecified: Secondary | ICD-10-CM | POA: Diagnosis not present

## 2020-12-01 DIAGNOSIS — E7800 Pure hypercholesterolemia, unspecified: Secondary | ICD-10-CM | POA: Diagnosis not present

## 2020-12-01 DIAGNOSIS — I350 Nonrheumatic aortic (valve) stenosis: Secondary | ICD-10-CM | POA: Diagnosis not present

## 2020-12-01 DIAGNOSIS — I493 Ventricular premature depolarization: Secondary | ICD-10-CM | POA: Diagnosis not present

## 2020-12-01 DIAGNOSIS — I110 Hypertensive heart disease with heart failure: Secondary | ICD-10-CM | POA: Diagnosis not present

## 2020-12-01 DIAGNOSIS — D509 Iron deficiency anemia, unspecified: Secondary | ICD-10-CM | POA: Diagnosis not present

## 2020-12-01 DIAGNOSIS — D72829 Elevated white blood cell count, unspecified: Secondary | ICD-10-CM | POA: Diagnosis not present

## 2020-12-01 DIAGNOSIS — I5032 Chronic diastolic (congestive) heart failure: Secondary | ICD-10-CM | POA: Diagnosis not present

## 2020-12-01 NOTE — Telephone Encounter (Signed)
Transition Care Management Follow-up Telephone Call  Date of discharge and from where: 11/30/20 from [facility] ED/OBSV  How have you been since you were released from the hospital? Patient states," I am doing better. Just a little wheezing. Spirometer and Nebulizer in use PRN."  Monitoring saturation. Denies chest pain, fever, increased cough, chest pressure and all other symptoms.     Any questions or concerns? No  Items Reviewed:  Did the pt receive and understand the discharge instructions provided? Yes   Medications obtained and verified? Yes   Any new allergies since your discharge? No   Dietary orders reviewed? Regular  Do you have support at home? Yes   Home Care and Equipment/Supplies: Were home health services ordered? Notes HHRN started today. Awaiting PT consult.  Functional Questionnaire: (I = Independent and D = Dependent) ADLs: I  Bathing/Dressing-   Meal Prep-   Eating- I  Maintaining continence- i  Transferring/Ambulation- I  Managing Meds- I  Follow up appointments reviewed:   PCP Hospital f/u appt confirmed? Yes  Scheduled to see Dr. Lorin Picket on 12/03/20 @ 2:00.  Are transportation arrangements needed? No   If their condition worsens, is the pt aware to call PCP or go to the Emergency Dept.? Yes  Was the patient provided with contact information for the PCP's office or ED? Yes  Was to pt encouraged to call back with questions or concerns? Yes

## 2020-12-03 ENCOUNTER — Telehealth (INDEPENDENT_AMBULATORY_CARE_PROVIDER_SITE_OTHER): Payer: Medicare Other | Admitting: Internal Medicine

## 2020-12-03 ENCOUNTER — Other Ambulatory Visit: Payer: Self-pay

## 2020-12-03 DIAGNOSIS — E1159 Type 2 diabetes mellitus with other circulatory complications: Secondary | ICD-10-CM

## 2020-12-03 DIAGNOSIS — I493 Ventricular premature depolarization: Secondary | ICD-10-CM | POA: Diagnosis not present

## 2020-12-03 DIAGNOSIS — I5032 Chronic diastolic (congestive) heart failure: Secondary | ICD-10-CM | POA: Diagnosis not present

## 2020-12-03 DIAGNOSIS — I110 Hypertensive heart disease with heart failure: Secondary | ICD-10-CM | POA: Diagnosis not present

## 2020-12-03 DIAGNOSIS — E7800 Pure hypercholesterolemia, unspecified: Secondary | ICD-10-CM | POA: Diagnosis not present

## 2020-12-03 DIAGNOSIS — I252 Old myocardial infarction: Secondary | ICD-10-CM | POA: Diagnosis not present

## 2020-12-03 DIAGNOSIS — E1142 Type 2 diabetes mellitus with diabetic polyneuropathy: Secondary | ICD-10-CM | POA: Diagnosis not present

## 2020-12-03 DIAGNOSIS — E785 Hyperlipidemia, unspecified: Secondary | ICD-10-CM | POA: Diagnosis not present

## 2020-12-03 DIAGNOSIS — D509 Iron deficiency anemia, unspecified: Secondary | ICD-10-CM | POA: Diagnosis not present

## 2020-12-03 DIAGNOSIS — I25118 Atherosclerotic heart disease of native coronary artery with other forms of angina pectoris: Secondary | ICD-10-CM

## 2020-12-03 DIAGNOSIS — E1169 Type 2 diabetes mellitus with other specified complication: Secondary | ICD-10-CM | POA: Diagnosis not present

## 2020-12-03 DIAGNOSIS — J441 Chronic obstructive pulmonary disease with (acute) exacerbation: Secondary | ICD-10-CM | POA: Diagnosis not present

## 2020-12-03 DIAGNOSIS — I2510 Atherosclerotic heart disease of native coronary artery without angina pectoris: Secondary | ICD-10-CM | POA: Diagnosis not present

## 2020-12-03 DIAGNOSIS — D72829 Elevated white blood cell count, unspecified: Secondary | ICD-10-CM | POA: Diagnosis not present

## 2020-12-03 DIAGNOSIS — I350 Nonrheumatic aortic (valve) stenosis: Secondary | ICD-10-CM | POA: Diagnosis not present

## 2020-12-03 NOTE — Progress Notes (Signed)
[name], female   DOB: 1933/06/16, 87 y.o.   MRN: [id number]   Virtual Visit via telephone Note  This visit type was conducted due to national recommendations for restrictions regarding the COVID-19 pandemic (e.g. social distancing).  This format is felt to be most appropriate for this patient at this time.  All issues noted in this document were discussed and addressed.  No physical exam was performed (except for noted visual exam findings with Video Visits).   I connected with [name] by telephone and verified that I am speaking with the correct person using two identifiers. Location patient: home Location provider: work Persons participating in the virtual visit: patient, provider  The limitations, risks, security and privacy concerns of performing an evaluation and management service by telephone and the availability of in person appointments have been discussed.  It has also been discussed with the patient that there may be a patient responsible charge related to this service. The patient expressed understanding and agreed to proceed.   Reason for visit: hospital follow up.   HPI: She presented to the ER 11/30/19 with sob. Also reports some pressure in her chest. EMS was called and she was given two breathing treatments and IV solumedrol prior to her arrival to ER.  Admitted with COPD exacerbation and placed on oxygen. Placed on steroids and abx.  Discharged to continue prednisone taper and complete azithromycin course.  Since her discharge, she reports no chest pressure.  Breathing better.  Occasional cough.  No increased sob.  Eating.  No nausea or vomiting.  Bowels moving.    ROS: See pertinent positives and negatives per HPI.  Past Medical History:  Diagnosis Date  . Aortic insufficiency    a. TTE 12/19: EF 55-60%, probable HK of the mid apical anterior septal myocardium, Gr1DD, mild AI, mildly dilated LA  . Asthma   . CAD (coronary artery disease)     a. NSTEMI 12/19; b. LHC 10/08/18: LM minimal luminal irregs, mLAD-1 95% s/p PCI/DES, mLAD-2 60%, LCx mild diffuse disease throughout, RCA minimal luminal irregs; b. 03/2020 MV: EF>65%, no ischemia/scar.  . CHF (congestive heart failure) (HCC)   . Diabetes mellitus (HCC)   . Hypercholesterolemia   . Hypertension   . Myocardial infarction (HCC)   . Osteopenia   . Palpitations    a. 04/2020 Zio: Avg HR 75. 429 SVT episodes, longest 19 secs @ 133. Occas PACs (3.2%). Rare PVCs (<1%).  . Polymyalgia rheumatica syndrome (HCC)   . Reactive airway disease     Past Surgical History:  Procedure Laterality Date  . ABDOMINAL HYSTERECTOMY  1981   prolapse and bleeding, ovaries not removed  . BREAST EXCISIONAL BIOPSY Right   . CHOLECYSTECTOMY N/A 09/02/2019   Procedure: LAPAROSCOPIC CHOLECYSTECTOMY WITH INTRAOPERATIVE CHOLANGIOGRAM;  Surgeon: Henrene Dodge, MD;  Location: [facility] ORS;  Service: General;  Laterality: N/A;  . CORONARY STENT INTERVENTION N/A 10/08/2018   Procedure: CORONARY STENT INTERVENTION;  Surgeon: Iran Ouch, MD;  Location: [facility] INVASIVE CV LAB;  Service: Cardiovascular;  Laterality: N/A;  . ENDOSCOPIC RETROGRADE CHOLANGIOPANCREATOGRAPHY (ERCP) WITH PROPOFOL N/A 08/08/2019   Procedure: ENDOSCOPIC RETROGRADE CHOLANGIOPANCREATOGRAPHY (ERCP) WITH PROPOFOL;  Surgeon: Midge Minium, MD;  Location: [facility] ENDOSCOPY;  Service: Endoscopy;  Laterality: N/A;  . LEFT HEART CATH AND CORONARY ANGIOGRAPHY N/A 10/08/2018   Procedure: LEFT HEART CATH AND CORONARY ANGIOGRAPHY;  Surgeon: Iran Ouch, MD;  Location: [facility] INVASIVE CV LAB;  Service: Cardiovascular;  Laterality: N/A;  . LEFT HEART CATH  AND CORONARY ANGIOGRAPHY N/A 08/10/2020   Procedure: LEFT HEART CATH AND CORONARY ANGIOGRAPHY possible percutaneous intervention;  Surgeon: Iran Ouch, MD;  Location: [facility] INVASIVE CV LAB;  Service: Cardiovascular;  Laterality: N/A;  . UMBILICAL HERNIA REPAIR  7/94    Family History  Problem  Relation Age of Onset  . Heart attack Father   . Arthritis Mother   . Heart disease Mother   . Throat cancer Sister   . Parkinson's disease Sister   . COPD Brother     SOCIAL HX: reviewed.    Current Outpatient Medications:  .  ADVAIR DISKUS 250-50 MCG/DOSE AEPB, INHALE ONE PUFF BY MOUTH EVERY 12 HOURS. RINSE MOUTH AFTER EACH USE (Patient taking differently: Inhale 1 puff into the lungs 2 (two) times daily.), Disp: 60 each, Rfl: 5 .  albuterol (PROVENTIL) (2.5 MG/3ML) 0.083% nebulizer solution, USE 1 VIAL IN NEBULIZER EVERY 4 HOURS AS NEEDED FOR WHEEZING OR SHORTNESS OF BREATH (Patient taking differently: Take 2.5 mg by nebulization in the morning and at bedtime.), Disp: 180 mL, Rfl: 0 .  alendronate (FOSAMAX) 70 MG tablet, Take 70 mg by mouth once a week. , Disp: , Rfl:  .  aspirin 81 MG tablet, Take 81 mg by mouth daily., Disp: , Rfl:  .  atorvastatin (LIPITOR) 80 MG tablet, Take 1 tablet (80 mg total) by mouth daily at 6 PM., Disp: 90 tablet, Rfl: 3 .  fluticasone (FLONASE) 50 MCG/ACT nasal spray, Use 2 spray(s) in each nostril once daily (Patient taking differently: Place 2 sprays into both nostrils daily.), Disp: 16 g, Rfl: 0 .  isosorbide mononitrate (IMDUR) 30 MG 24 hr tablet, Take 1 tablet (30 mg total) by mouth daily., Disp: 30 tablet, Rfl: 0 .  lisinopril (ZESTRIL) 20 MG tablet, Take 1 tablet by mouth once daily, Disp: 90 tablet, Rfl: 0 .  metFORMIN (GLUCOPHAGE) 500 MG tablet, TAKE 1 TABLET BY MOUTH TWICE DAILY WITH A MEAL (Patient taking differently: Take 500 mg by mouth 2 (two) times daily with a meal.), Disp: 180 tablet, Rfl: 0 .  metoprolol succinate (TOPROL-XL) 50 MG 24 hr tablet, Take 1 tablet (50 mg total) by mouth daily. Take with or immediately following a meal., Disp: 30 tablet, Rfl: 0 .  metoprolol tartrate (LOPRESSOR) 25 MG tablet, Take 1 tablet (25 mg total) by mouth daily as needed (if Heart rate is >120)., Disp: 30 tablet, Rfl: 0 .  Nebulizers (COMPRESSOR/NEBULIZER)  MISC, 1 Units by Does not apply route as directed., Disp: 1 each, Rfl: 0 .  nitroGLYCERIN (NITROSTAT) 0.4 MG SL tablet, Place 1 tablet (0.4 mg total) under the tongue every 5 (five) minutes as needed for chest pain., Disp: 30 tablet, Rfl: 12 .  predniSONE (DELTASONE) 20 MG tablet, Take 2 tablets (40 mg total) by mouth daily for 5 days., Disp: 10 tablet, Rfl: 0 .  PROAIR HFA 108 (90 Base) MCG/ACT inhaler, Inhale 2 puffs into the lungs every 4 (four) hours as needed for wheezing or shortness of breath., Disp: 18 g, Rfl: 0 .  Vitamin D, Ergocalciferol, (DRISDOL) 50000 units CAPS capsule, Take 50,000 Units by mouth once a week., Disp: , Rfl: 3  EXAM:  GENERAL: alert. Sounds to be in no acute distress.  Answering questions appropriately.   PSYCH/NEURO: pleasant and cooperative, no obvious depression or anxiety, speech and thought processing grossly intact  ASSESSMENT AND PLAN:  Discussed the following assessment and plan:  Problem List Items Addressed This Visit    Chronic diastolic CHF (  congestive heart failure) (HCC)    Sounds to be in no acute distress.  Continue lisinopril and metoprolol.  Follow.       COPD exacerbation (HCC)    Recently admitted with COPD exacerbation.  Continues on prednisone taper.  Also completing azithromycin course.  Breathing better.  Occasional cough.  Continue inhalers.  Follow.       Coronary artery disease    Known CAD.  S/p PCI/DES - mid LAD.  Continue metoprolol, imdur and lisinopril.  No chest pain.  Breathing improved with treating copd exacerbation.  Follow.       Diabetes mellitus with cardiac complication (HCC)    Continues on metformin.  Sugars stable.  Good appetite.  Follow.           I discussed the assessment and treatment plan with the patient. The patient was provided an opportunity to ask questions and all were answered. The patient agreed with the plan and demonstrated an understanding of the instructions.   The patient was advised to  call back or seek an in-person evaluation if the symptoms worsen or if the condition fails to improve as anticipated.  I provided 23 minutes of non-face-to-face time during this encounter.   Dale Salem, MD

## 2020-12-05 ENCOUNTER — Encounter: Payer: Self-pay | Admitting: Internal Medicine

## 2020-12-05 NOTE — Assessment & Plan Note (Signed)
Continues on metformin.  Sugars stable.  Good appetite.  Follow.

## 2020-12-05 NOTE — Assessment & Plan Note (Signed)
Known CAD.  S/p PCI/DES - mid LAD.  Continue metoprolol, imdur and lisinopril.  No chest pain.  Breathing improved with treating copd exacerbation.  Follow.

## 2020-12-05 NOTE — Assessment & Plan Note (Signed)
Sounds to be in no acute distress.  Continue lisinopril and metoprolol.  Follow.

## 2020-12-05 NOTE — Assessment & Plan Note (Signed)
Recently admitted with COPD exacerbation.  Continues on prednisone taper.  Also completing azithromycin course.  Breathing better.  Occasional cough.  Continue inhalers.  Follow.

## 2020-12-07 DIAGNOSIS — I5032 Chronic diastolic (congestive) heart failure: Secondary | ICD-10-CM | POA: Diagnosis not present

## 2020-12-07 DIAGNOSIS — E785 Hyperlipidemia, unspecified: Secondary | ICD-10-CM | POA: Diagnosis not present

## 2020-12-07 DIAGNOSIS — I350 Nonrheumatic aortic (valve) stenosis: Secondary | ICD-10-CM | POA: Diagnosis not present

## 2020-12-07 DIAGNOSIS — I2510 Atherosclerotic heart disease of native coronary artery without angina pectoris: Secondary | ICD-10-CM | POA: Diagnosis not present

## 2020-12-07 DIAGNOSIS — E1169 Type 2 diabetes mellitus with other specified complication: Secondary | ICD-10-CM | POA: Diagnosis not present

## 2020-12-07 DIAGNOSIS — E1142 Type 2 diabetes mellitus with diabetic polyneuropathy: Secondary | ICD-10-CM | POA: Diagnosis not present

## 2020-12-07 DIAGNOSIS — I110 Hypertensive heart disease with heart failure: Secondary | ICD-10-CM | POA: Diagnosis not present

## 2020-12-07 DIAGNOSIS — D509 Iron deficiency anemia, unspecified: Secondary | ICD-10-CM | POA: Diagnosis not present

## 2020-12-07 DIAGNOSIS — I252 Old myocardial infarction: Secondary | ICD-10-CM | POA: Diagnosis not present

## 2020-12-07 DIAGNOSIS — E7800 Pure hypercholesterolemia, unspecified: Secondary | ICD-10-CM | POA: Diagnosis not present

## 2020-12-07 DIAGNOSIS — I493 Ventricular premature depolarization: Secondary | ICD-10-CM | POA: Diagnosis not present

## 2020-12-07 DIAGNOSIS — D72829 Elevated white blood cell count, unspecified: Secondary | ICD-10-CM | POA: Diagnosis not present

## 2020-12-07 DIAGNOSIS — J441 Chronic obstructive pulmonary disease with (acute) exacerbation: Secondary | ICD-10-CM | POA: Diagnosis not present

## 2020-12-08 DIAGNOSIS — J441 Chronic obstructive pulmonary disease with (acute) exacerbation: Secondary | ICD-10-CM | POA: Diagnosis not present

## 2020-12-08 DIAGNOSIS — E785 Hyperlipidemia, unspecified: Secondary | ICD-10-CM | POA: Diagnosis not present

## 2020-12-08 DIAGNOSIS — I493 Ventricular premature depolarization: Secondary | ICD-10-CM | POA: Diagnosis not present

## 2020-12-08 DIAGNOSIS — I252 Old myocardial infarction: Secondary | ICD-10-CM | POA: Diagnosis not present

## 2020-12-08 DIAGNOSIS — D509 Iron deficiency anemia, unspecified: Secondary | ICD-10-CM | POA: Diagnosis not present

## 2020-12-08 DIAGNOSIS — I5032 Chronic diastolic (congestive) heart failure: Secondary | ICD-10-CM | POA: Diagnosis not present

## 2020-12-08 DIAGNOSIS — D72829 Elevated white blood cell count, unspecified: Secondary | ICD-10-CM | POA: Diagnosis not present

## 2020-12-08 DIAGNOSIS — I350 Nonrheumatic aortic (valve) stenosis: Secondary | ICD-10-CM | POA: Diagnosis not present

## 2020-12-08 DIAGNOSIS — I2510 Atherosclerotic heart disease of native coronary artery without angina pectoris: Secondary | ICD-10-CM | POA: Diagnosis not present

## 2020-12-08 DIAGNOSIS — I110 Hypertensive heart disease with heart failure: Secondary | ICD-10-CM | POA: Diagnosis not present

## 2020-12-08 DIAGNOSIS — E1169 Type 2 diabetes mellitus with other specified complication: Secondary | ICD-10-CM | POA: Diagnosis not present

## 2020-12-08 DIAGNOSIS — E1142 Type 2 diabetes mellitus with diabetic polyneuropathy: Secondary | ICD-10-CM | POA: Diagnosis not present

## 2020-12-08 DIAGNOSIS — E7800 Pure hypercholesterolemia, unspecified: Secondary | ICD-10-CM | POA: Diagnosis not present

## 2020-12-10 DIAGNOSIS — I350 Nonrheumatic aortic (valve) stenosis: Secondary | ICD-10-CM | POA: Diagnosis not present

## 2020-12-10 DIAGNOSIS — E785 Hyperlipidemia, unspecified: Secondary | ICD-10-CM | POA: Diagnosis not present

## 2020-12-10 DIAGNOSIS — I493 Ventricular premature depolarization: Secondary | ICD-10-CM | POA: Diagnosis not present

## 2020-12-10 DIAGNOSIS — I2510 Atherosclerotic heart disease of native coronary artery without angina pectoris: Secondary | ICD-10-CM | POA: Diagnosis not present

## 2020-12-10 DIAGNOSIS — J441 Chronic obstructive pulmonary disease with (acute) exacerbation: Secondary | ICD-10-CM | POA: Diagnosis not present

## 2020-12-10 DIAGNOSIS — I5032 Chronic diastolic (congestive) heart failure: Secondary | ICD-10-CM | POA: Diagnosis not present

## 2020-12-10 DIAGNOSIS — I110 Hypertensive heart disease with heart failure: Secondary | ICD-10-CM | POA: Diagnosis not present

## 2020-12-10 DIAGNOSIS — E1142 Type 2 diabetes mellitus with diabetic polyneuropathy: Secondary | ICD-10-CM | POA: Diagnosis not present

## 2020-12-10 DIAGNOSIS — E7800 Pure hypercholesterolemia, unspecified: Secondary | ICD-10-CM | POA: Diagnosis not present

## 2020-12-10 DIAGNOSIS — I252 Old myocardial infarction: Secondary | ICD-10-CM | POA: Diagnosis not present

## 2020-12-10 DIAGNOSIS — D72829 Elevated white blood cell count, unspecified: Secondary | ICD-10-CM | POA: Diagnosis not present

## 2020-12-10 DIAGNOSIS — E1169 Type 2 diabetes mellitus with other specified complication: Secondary | ICD-10-CM | POA: Diagnosis not present

## 2020-12-10 DIAGNOSIS — D509 Iron deficiency anemia, unspecified: Secondary | ICD-10-CM | POA: Diagnosis not present

## 2020-12-14 DIAGNOSIS — I252 Old myocardial infarction: Secondary | ICD-10-CM | POA: Diagnosis not present

## 2020-12-14 DIAGNOSIS — D509 Iron deficiency anemia, unspecified: Secondary | ICD-10-CM | POA: Diagnosis not present

## 2020-12-14 DIAGNOSIS — I493 Ventricular premature depolarization: Secondary | ICD-10-CM | POA: Diagnosis not present

## 2020-12-14 DIAGNOSIS — I350 Nonrheumatic aortic (valve) stenosis: Secondary | ICD-10-CM | POA: Diagnosis not present

## 2020-12-14 DIAGNOSIS — I2510 Atherosclerotic heart disease of native coronary artery without angina pectoris: Secondary | ICD-10-CM | POA: Diagnosis not present

## 2020-12-14 DIAGNOSIS — I5032 Chronic diastolic (congestive) heart failure: Secondary | ICD-10-CM | POA: Diagnosis not present

## 2020-12-14 DIAGNOSIS — I110 Hypertensive heart disease with heart failure: Secondary | ICD-10-CM | POA: Diagnosis not present

## 2020-12-14 DIAGNOSIS — J441 Chronic obstructive pulmonary disease with (acute) exacerbation: Secondary | ICD-10-CM | POA: Diagnosis not present

## 2020-12-14 DIAGNOSIS — E7800 Pure hypercholesterolemia, unspecified: Secondary | ICD-10-CM | POA: Diagnosis not present

## 2020-12-14 DIAGNOSIS — E1169 Type 2 diabetes mellitus with other specified complication: Secondary | ICD-10-CM | POA: Diagnosis not present

## 2020-12-14 DIAGNOSIS — E1142 Type 2 diabetes mellitus with diabetic polyneuropathy: Secondary | ICD-10-CM | POA: Diagnosis not present

## 2020-12-14 DIAGNOSIS — D72829 Elevated white blood cell count, unspecified: Secondary | ICD-10-CM | POA: Diagnosis not present

## 2020-12-14 DIAGNOSIS — E785 Hyperlipidemia, unspecified: Secondary | ICD-10-CM | POA: Diagnosis not present

## 2020-12-14 NOTE — Progress Notes (Signed)
[name], female    DOB: 26-Oct-1933, 87 y.o.   MRN: [id number]  HPI  Tanya Cole is a 87 y/o female with a history of asthma, CAD, DM, hyperlipidemia, HTN and chronic heart failure.   Echo report from 08/08/20 reviewed and showed an EF of 60-65% along with mild LAE and mild MR.   LHC done 08/10/20 showed:  Mid LAD lesion is 60% stenosed.  Prox LAD lesion is 30% stenosed.  Dist LAD lesion is 90% stenosed.  Prox RCA lesion is 30% stenosed.  The left ventricular systolic function is normal.  LV end diastolic pressure is mildly elevated.  The left ventricular ejection fraction is 55-65% by visual estimate.   1.  Significant one-vessel coronary artery disease with patent proximal LAD stent with mild in-stent restenosis.  Stable mid LAD stenosis at 60% and significant distal LAD stenosis 90% close to the apex. 2.  Normal LV systolic function mildly elevated left ventricular end-diastolic pressure.  Was in the ED 11/29/20 due to shortness of breath. Given 2 breathing treatments along with solumedrol. OT eval done and she was released. Admitted 10/08/20 due to wheezing and shortness of breath. Nebulizer and steroids given for COPD exacerbation. Discharged after 3 days.    She presents today for a follow-up visit with a chief complaint of minimal shortness of breath upon moderate exertion. She describes this as chronic in nature having been present for several years. She has associated fatigue and chest tightness along with this. She denies any difficulty sleeping, abdominal distention, palpitations, pedal edema, chest pain, cough, dizziness or weight gain.  Past Medical History:  Diagnosis Date  . Aortic insufficiency    a. TTE 12/19: EF 55-60%, probable HK of the mid apical anterior septal myocardium, Gr1DD, mild AI, mildly dilated LA  . Asthma   . CAD (coronary artery disease)    a. NSTEMI 12/19; b. LHC 10/08/18: LM minimal luminal irregs, mLAD-1 95% s/p PCI/DES, mLAD-2  60%, LCx mild diffuse disease throughout, RCA minimal luminal irregs; b. 03/2020 MV: EF>65%, no ischemia/scar.  . CHF (congestive heart failure) (HCC)   . Diabetes mellitus (HCC)   . Hypercholesterolemia   . Hypertension   . Myocardial infarction (HCC)   . Osteopenia   . Palpitations    a. 04/2020 Zio: Avg HR 75. 429 SVT episodes, longest 19 secs @ 133. Occas PACs (3.2%). Rare PVCs (<1%).  . Polymyalgia rheumatica syndrome (HCC)   . Reactive airway disease    Past Surgical History:  Procedure Laterality Date  . ABDOMINAL HYSTERECTOMY  1981   prolapse and bleeding, ovaries not removed  . BREAST EXCISIONAL BIOPSY Right   . CHOLECYSTECTOMY N/A 09/02/2019   Procedure: LAPAROSCOPIC CHOLECYSTECTOMY WITH INTRAOPERATIVE CHOLANGIOGRAM;  Surgeon: Henrene Dodge, MD;  Location: [facility] ORS;  Service: General;  Laterality: N/A;  . CORONARY STENT INTERVENTION N/A 10/08/2018   Procedure: CORONARY STENT INTERVENTION;  Surgeon: Iran Ouch, MD;  Location: [facility];  Service: Cardiovascular;  Laterality: N/A;  . ENDOSCOPIC RETROGRADE CHOLANGIOPANCREATOGRAPHY (ERCP) WITH PROPOFOL N/A 08/08/2019   Procedure: ENDOSCOPIC RETROGRADE CHOLANGIOPANCREATOGRAPHY (ERCP) WITH PROPOFOL;  Surgeon: Midge Minium, MD;  Location: [facility];  Service: Endoscopy;  Laterality: N/A;  . LEFT HEART CATH AND CORONARY ANGIOGRAPHY N/A 10/08/2018   Procedure: LEFT HEART CATH AND CORONARY ANGIOGRAPHY;  Surgeon: Iran Ouch, MD;  Location: [facility];  Service: Cardiovascular;  Laterality: N/A;  . LEFT HEART CATH AND CORONARY ANGIOGRAPHY N/A 08/10/2020   Procedure: LEFT  HEART CATH AND CORONARY ANGIOGRAPHY possible percutaneous intervention;  Surgeon: Iran Ouch, MD;  Location: [facility];  Service: Cardiovascular;  Laterality: N/A;  . UMBILICAL HERNIA REPAIR  7/94   Family History  Problem Relation Age of Onset  . Heart attack Father   . Arthritis Mother   . Heart disease Mother   .  Throat cancer Sister   . Parkinson's disease Sister   . COPD Brother    Social History   Tobacco Use  . Smoking status: Never Smoker  . Smokeless tobacco: Never Used  Substance Use Topics  . Alcohol use: No    Alcohol/week: 0.0 standard drinks   Allergies  Allergen Reactions  . Tramadol Itching and Nausea And Vomiting   Prior to Admission medications   Medication Sig Start Date End Date Taking? Authorizing Provider  ADVAIR DISKUS 250-50 MCG/DOSE AEPB INHALE ONE PUFF BY MOUTH EVERY 12 HOURS. RINSE MOUTH AFTER EACH USE Patient taking differently: Inhale 1 puff into the lungs 2 (two) times daily. 02/17/16  Yes Dale [place], MD  albuterol (PROVENTIL) (2.5 MG/3ML) 0.083% nebulizer solution USE 1 VIAL IN NEBULIZER EVERY 4 HOURS AS NEEDED FOR WHEEZING OR SHORTNESS OF BREATH Patient taking differently: Take 2.5 mg by nebulization in the morning and at bedtime. 11/25/20  Yes Dale [place], MD  alendronate (FOSAMAX) 70 MG tablet Take 70 mg by mouth once a week.  11/06/17  Yes [provider]  aspirin 81 MG tablet Take 81 mg by mouth daily.   Yes [provider]  atorvastatin (LIPITOR) 80 MG tablet Take 1 tablet (80 mg total) by mouth daily at 6 PM. 04/01/20  Yes Alver Sorrow, NP  fluticasone (FLONASE) 50 MCG/ACT nasal spray Use 2 spray(s) in each nostril once daily Patient taking differently: Place 2 sprays into both nostrils daily. 08/03/20  Yes Dale [place], MD  isosorbide mononitrate (IMDUR) 30 MG 24 hr tablet Take 1 tablet (30 mg total) by mouth daily. 08/11/20  Yes Arnetha Courser, MD  lisinopril (ZESTRIL) 20 MG tablet Take 1 tablet by mouth once daily 11/17/20  Yes Dunn, Raymon Mutton, PA-C  metFORMIN (GLUCOPHAGE) 500 MG tablet TAKE 1 TABLET BY MOUTH TWICE DAILY WITH A MEAL Patient taking differently: Take 500 mg by mouth 2 (two) times daily with a meal. 09/29/20  Yes Dale , MD  metoprolol succinate (TOPROL-XL) 50 MG 24 hr tablet Take 1 tablet (50 mg total) by  mouth daily. Take with or immediately following a meal. 08/21/20  Yes Rolly Salter, MD  metoprolol tartrate (LOPRESSOR) 25 MG tablet Take 1 tablet (25 mg total) by mouth daily as needed (if Heart rate is >120). 08/20/20  Yes Rolly Salter, MD  Nebulizers (COMPRESSOR/NEBULIZER) MISC 1 Units by Does not apply route as directed. 04/07/20  Yes Dionne Bucy, MD  nitroGLYCERIN (NITROSTAT) 0.4 MG SL tablet Place 1 tablet (0.4 mg total) under the tongue every 5 (five) minutes as needed for chest pain. 10/09/18  Yes Adrian Saran, MD  PROAIR HFA 108 (90 Base) MCG/ACT inhaler Inhale 2 puffs into the lungs every 4 (four) hours as needed for wheezing or shortness of breath. 08/20/20  Yes Rolly Salter, MD  Vitamin D, Ergocalciferol, (DRISDOL) 50000 units CAPS capsule Take 50,000 Units by mouth once a week. 06/24/18  Yes [provider]           Review of Systems  Constitutional: Positive for fatigue. Negative for appetite change.  HENT: Negative for congestion, rhinorrhea and  sore throat.   Eyes: Negative.   Respiratory: Positive for chest tightness and shortness of breath. Negative for cough.   Cardiovascular: Negative for chest pain, palpitations and leg swelling.  Gastrointestinal: Negative for abdominal distention and abdominal pain.  Endocrine: Negative.   Genitourinary: Negative.   Musculoskeletal: Negative for back pain and neck pain.  Skin: Negative.   Allergic/Immunologic: Negative.   Neurological: Negative for dizziness and light-headedness.  Hematological: Negative for adenopathy. Does not bruise/bleed easily.  Psychiatric/Behavioral: Negative for dysphoric mood and sleep disturbance (sleeping on 2 pillows). The patient is not nervous/anxious.    Vitals:   12/15/20 1016 12/15/20 1031  BP: (!) 163/71 (!) 152/78  Pulse: 79   Resp: 20   SpO2: 98%   Weight: 122 lb (55.3 kg)   Height: 5\' 1"  (1.549 m)    Wt Readings from Last 3 Encounters:  12/15/20 122 lb (55.3 kg)   12/03/20 116 lb 6.4 oz (52.8 kg)  11/29/20 118 lb (53.5 kg)   Lab Results  Component Value Date   CREATININE 0.62 11/29/2020   CREATININE 0.75 10/19/2020   CREATININE 0.85 10/11/2020    Physical Exam Vitals and nursing note reviewed.  Constitutional:      Appearance: She is well-developed.  HENT:     Head: Normocephalic and atraumatic.  Neck:     Vascular: No JVD.  Cardiovascular:     Rate and Rhythm: Normal rate and regular rhythm.  Pulmonary:     Effort: Pulmonary effort is normal. No respiratory distress.     Breath sounds: No wheezing or rales.  Abdominal:     Tenderness: There is no abdominal tenderness.  Musculoskeletal:     Cervical back: Neck supple.     Right lower leg: No tenderness. No edema.     Left lower leg: No tenderness. No edema.  Skin:    General: Skin is warm and dry.  Neurological:     General: No focal deficit present.     Mental Status: She is alert and oriented to person, place, and time.  Psychiatric:        Mood and Affect: Mood normal.        Behavior: Behavior normal.    Assessment & Plan:  1: Chronic heart failure with preserved ejection fraction with structural changes (LAE)- - NYHA class II - euvolemic today - weighing daily; reminded to call for an overnight weight gain of > 2 pounds or a weekly weight gain of > 5 pounds - weight up 2 pounds from last visit here 2 months ago - not adding additional salt to her food and tries to be mindful of what she's eating - saw cardiology 14/09/2020) 09/15/20 - BNP 10/08/20 was 238.3 - she says that she doesn't get the flu vaccine - doesn't want to get the covid vaccine either  2: HTN- - BP mildly elevated today; encouraged to monitor - had telemedicine visit with PCP 14/9/21) 12/03/20 - BMP 11/29/20 reviewed and showed sodium 141, potassium 3.9, creatinine 0.62 and GFR >60  3: DM- - A1c 10/08/20 was 6.3% - glucose at home today was 122   Patient did not bring her medications nor a list. Each  medication was verbally reviewed with the patient and she was encouraged to bring the bottles to every visit to confirm accuracy of list.  Due to HF stability, will not make a return appointment for patient at this time. Advised patient to call 14/9/21 in the future for any questions or if she'd  like to schedule another appointment. Patient was comfortable with this plan.

## 2020-12-15 ENCOUNTER — Encounter: Payer: Self-pay | Admitting: Family

## 2020-12-15 ENCOUNTER — Other Ambulatory Visit: Payer: Self-pay

## 2020-12-15 ENCOUNTER — Ambulatory Visit: Payer: Medicare Other | Attending: Family | Admitting: Family

## 2020-12-15 VITALS — BP 152/78 | HR 79 | Resp 20 | Ht 61.0 in | Wt 122.0 lb

## 2020-12-15 DIAGNOSIS — E785 Hyperlipidemia, unspecified: Secondary | ICD-10-CM | POA: Diagnosis not present

## 2020-12-15 DIAGNOSIS — Z8249 Family history of ischemic heart disease and other diseases of the circulatory system: Secondary | ICD-10-CM | POA: Diagnosis not present

## 2020-12-15 DIAGNOSIS — Z955 Presence of coronary angioplasty implant and graft: Secondary | ICD-10-CM | POA: Insufficient documentation

## 2020-12-15 DIAGNOSIS — J441 Chronic obstructive pulmonary disease with (acute) exacerbation: Secondary | ICD-10-CM | POA: Insufficient documentation

## 2020-12-15 DIAGNOSIS — E1169 Type 2 diabetes mellitus with other specified complication: Secondary | ICD-10-CM | POA: Diagnosis not present

## 2020-12-15 DIAGNOSIS — I5032 Chronic diastolic (congestive) heart failure: Secondary | ICD-10-CM | POA: Insufficient documentation

## 2020-12-15 DIAGNOSIS — Z7984 Long term (current) use of oral hypoglycemic drugs: Secondary | ICD-10-CM | POA: Insufficient documentation

## 2020-12-15 DIAGNOSIS — I493 Ventricular premature depolarization: Secondary | ICD-10-CM | POA: Diagnosis not present

## 2020-12-15 DIAGNOSIS — E119 Type 2 diabetes mellitus without complications: Secondary | ICD-10-CM | POA: Diagnosis not present

## 2020-12-15 DIAGNOSIS — D509 Iron deficiency anemia, unspecified: Secondary | ICD-10-CM | POA: Diagnosis not present

## 2020-12-15 DIAGNOSIS — I10 Essential (primary) hypertension: Secondary | ICD-10-CM

## 2020-12-15 DIAGNOSIS — Z79899 Other long term (current) drug therapy: Secondary | ICD-10-CM | POA: Diagnosis not present

## 2020-12-15 DIAGNOSIS — E7800 Pure hypercholesterolemia, unspecified: Secondary | ICD-10-CM | POA: Diagnosis not present

## 2020-12-15 DIAGNOSIS — Z7901 Long term (current) use of anticoagulants: Secondary | ICD-10-CM | POA: Diagnosis not present

## 2020-12-15 DIAGNOSIS — I2510 Atherosclerotic heart disease of native coronary artery without angina pectoris: Secondary | ICD-10-CM | POA: Diagnosis not present

## 2020-12-15 DIAGNOSIS — I110 Hypertensive heart disease with heart failure: Secondary | ICD-10-CM | POA: Insufficient documentation

## 2020-12-15 DIAGNOSIS — Z7982 Long term (current) use of aspirin: Secondary | ICD-10-CM | POA: Diagnosis not present

## 2020-12-15 DIAGNOSIS — R0602 Shortness of breath: Secondary | ICD-10-CM | POA: Diagnosis not present

## 2020-12-15 DIAGNOSIS — D72829 Elevated white blood cell count, unspecified: Secondary | ICD-10-CM | POA: Diagnosis not present

## 2020-12-15 DIAGNOSIS — I252 Old myocardial infarction: Secondary | ICD-10-CM | POA: Diagnosis not present

## 2020-12-15 DIAGNOSIS — I350 Nonrheumatic aortic (valve) stenosis: Secondary | ICD-10-CM | POA: Diagnosis not present

## 2020-12-15 DIAGNOSIS — E1142 Type 2 diabetes mellitus with diabetic polyneuropathy: Secondary | ICD-10-CM | POA: Diagnosis not present

## 2020-12-15 NOTE — Patient Instructions (Addendum)
Continue weighing daily and call for an overnight weight gain of > 2 pounds or a weekly weight gain of >5 pounds.   Call us in the future if you'd like to schedule another appointment 

## 2020-12-17 DIAGNOSIS — J441 Chronic obstructive pulmonary disease with (acute) exacerbation: Secondary | ICD-10-CM | POA: Diagnosis not present

## 2020-12-17 DIAGNOSIS — I350 Nonrheumatic aortic (valve) stenosis: Secondary | ICD-10-CM | POA: Diagnosis not present

## 2020-12-17 DIAGNOSIS — I2510 Atherosclerotic heart disease of native coronary artery without angina pectoris: Secondary | ICD-10-CM | POA: Diagnosis not present

## 2020-12-17 DIAGNOSIS — E1142 Type 2 diabetes mellitus with diabetic polyneuropathy: Secondary | ICD-10-CM | POA: Diagnosis not present

## 2020-12-17 DIAGNOSIS — I252 Old myocardial infarction: Secondary | ICD-10-CM | POA: Diagnosis not present

## 2020-12-17 DIAGNOSIS — I493 Ventricular premature depolarization: Secondary | ICD-10-CM | POA: Diagnosis not present

## 2020-12-17 DIAGNOSIS — E1169 Type 2 diabetes mellitus with other specified complication: Secondary | ICD-10-CM | POA: Diagnosis not present

## 2020-12-17 DIAGNOSIS — I110 Hypertensive heart disease with heart failure: Secondary | ICD-10-CM | POA: Diagnosis not present

## 2020-12-17 DIAGNOSIS — E7800 Pure hypercholesterolemia, unspecified: Secondary | ICD-10-CM | POA: Diagnosis not present

## 2020-12-17 DIAGNOSIS — E785 Hyperlipidemia, unspecified: Secondary | ICD-10-CM | POA: Diagnosis not present

## 2020-12-17 DIAGNOSIS — D72829 Elevated white blood cell count, unspecified: Secondary | ICD-10-CM | POA: Diagnosis not present

## 2020-12-17 DIAGNOSIS — D509 Iron deficiency anemia, unspecified: Secondary | ICD-10-CM | POA: Diagnosis not present

## 2020-12-17 DIAGNOSIS — I5032 Chronic diastolic (congestive) heart failure: Secondary | ICD-10-CM | POA: Diagnosis not present

## 2020-12-19 ENCOUNTER — Emergency Department
Admission: EM | Admit: 2020-12-19 | Discharge: 2020-12-19 | Disposition: A | Discharge status: Home / Self Care | Payer: Medicare Other | Attending: Emergency Medicine | Admitting: Emergency Medicine

## 2020-12-19 ENCOUNTER — Other Ambulatory Visit: Payer: Self-pay

## 2020-12-19 ENCOUNTER — Emergency Department: Payer: Medicare Other

## 2020-12-19 DIAGNOSIS — J8410 Pulmonary fibrosis, unspecified: Secondary | ICD-10-CM | POA: Diagnosis not present

## 2020-12-19 DIAGNOSIS — U071 COVID-19: Secondary | ICD-10-CM | POA: Insufficient documentation

## 2020-12-19 DIAGNOSIS — Z955 Presence of coronary angioplasty implant and graft: Secondary | ICD-10-CM | POA: Diagnosis not present

## 2020-12-19 DIAGNOSIS — Z7982 Long term (current) use of aspirin: Secondary | ICD-10-CM | POA: Diagnosis not present

## 2020-12-19 DIAGNOSIS — J849 Interstitial pulmonary disease, unspecified: Secondary | ICD-10-CM | POA: Diagnosis not present

## 2020-12-19 DIAGNOSIS — Z79899 Other long term (current) drug therapy: Secondary | ICD-10-CM | POA: Diagnosis not present

## 2020-12-19 DIAGNOSIS — I2510 Atherosclerotic heart disease of native coronary artery without angina pectoris: Secondary | ICD-10-CM | POA: Diagnosis not present

## 2020-12-19 DIAGNOSIS — J441 Chronic obstructive pulmonary disease with (acute) exacerbation: Secondary | ICD-10-CM | POA: Insufficient documentation

## 2020-12-19 DIAGNOSIS — E1159 Type 2 diabetes mellitus with other circulatory complications: Secondary | ICD-10-CM | POA: Insufficient documentation

## 2020-12-19 DIAGNOSIS — M791 Myalgia, unspecified site: Secondary | ICD-10-CM | POA: Diagnosis present

## 2020-12-19 DIAGNOSIS — I110 Hypertensive heart disease with heart failure: Secondary | ICD-10-CM | POA: Diagnosis not present

## 2020-12-19 DIAGNOSIS — Z7984 Long term (current) use of oral hypoglycemic drugs: Secondary | ICD-10-CM | POA: Insufficient documentation

## 2020-12-19 DIAGNOSIS — J45909 Unspecified asthma, uncomplicated: Secondary | ICD-10-CM | POA: Insufficient documentation

## 2020-12-19 DIAGNOSIS — J42 Unspecified chronic bronchitis: Secondary | ICD-10-CM | POA: Diagnosis not present

## 2020-12-19 DIAGNOSIS — I5032 Chronic diastolic (congestive) heart failure: Secondary | ICD-10-CM | POA: Insufficient documentation

## 2020-12-19 LAB — URINALYSIS, COMPLETE (UACMP) WITH MICROSCOPIC
Bilirubin Urine: NEGATIVE
Glucose, UA: 50 mg/dL — AB
Hgb urine dipstick: NEGATIVE
Ketones, ur: 5 mg/dL — AB
Leukocytes,Ua: NEGATIVE
Nitrite: NEGATIVE
Protein, ur: 100 mg/dL — AB
Specific Gravity, Urine: 1.02 (ref 1.005–1.030)
pH: 5 (ref 5.0–8.0)

## 2020-12-19 LAB — CBC WITH DIFFERENTIAL/PLATELET
Abs Immature Granulocytes: 0.03 10*3/uL (ref 0.00–0.07)
Basophils Absolute: 0 10*3/uL (ref 0.0–0.1)
Basophils Relative: 0 %
Eosinophils Absolute: 0 10*3/uL (ref 0.0–0.5)
Eosinophils Relative: 0 %
HCT: 38.5 % (ref 36.0–46.0)
Hemoglobin: 12.8 g/dL (ref 12.0–15.0)
Immature Granulocytes: 0 %
Lymphocytes Relative: 11 %
Lymphs Abs: 1 10*3/uL (ref 0.7–4.0)
MCH: 29.3 pg (ref 26.0–34.0)
MCHC: 33.2 g/dL (ref 30.0–36.0)
MCV: 88.1 fL (ref 80.0–100.0)
Monocytes Absolute: 0.8 10*3/uL (ref 0.1–1.0)
Monocytes Relative: 8 %
Neutro Abs: 7.9 10*3/uL — ABNORMAL HIGH (ref 1.7–7.7)
Neutrophils Relative %: 81 %
Platelets: 197 10*3/uL (ref 150–400)
RBC: 4.37 MIL/uL (ref 3.87–5.11)
RDW: 14.4 % (ref 11.5–15.5)
WBC: 9.8 10*3/uL (ref 4.0–10.5)
nRBC: 0 % (ref 0.0–0.2)

## 2020-12-19 LAB — TROPONIN I (HIGH SENSITIVITY): Troponin I (High Sensitivity): 10 ng/L (ref <18)

## 2020-12-19 LAB — COMPREHENSIVE METABOLIC PANEL
ALT: 31 U/L (ref 0–44)
AST: 45 U/L — ABNORMAL HIGH (ref 15–41)
Albumin: 3.8 g/dL (ref 3.5–5.0)
Alkaline Phosphatase: 54 U/L (ref 38–126)
Anion gap: 12 (ref 5–15)
BUN: 24 mg/dL — ABNORMAL HIGH (ref 8–23)
CO2: 22 mmol/L (ref 22–32)
Calcium: 8.7 mg/dL — ABNORMAL LOW (ref 8.9–10.3)
Chloride: 100 mmol/L (ref 98–111)
Creatinine, Ser: 1.77 mg/dL — ABNORMAL HIGH (ref 0.44–1.00)
GFR, Estimated: 28 mL/min — ABNORMAL LOW (ref >60)
Glucose, Bld: 117 mg/dL — ABNORMAL HIGH (ref 70–99)
Potassium: 4.6 mmol/L (ref 3.5–5.1)
Sodium: 134 mmol/L — ABNORMAL LOW (ref 135–145)
Total Bilirubin: 0.7 mg/dL (ref 0.3–1.2)
Total Protein: 6.8 g/dL (ref 6.5–8.1)

## 2020-12-19 LAB — RESP PANEL BY RT-PCR (FLU A&B, COVID) ARPGX2
Influenza A by PCR: NEGATIVE
Influenza B by PCR: NEGATIVE
SARS Coronavirus 2 by RT PCR: POSITIVE — AB

## 2020-12-19 MED ORDER — ONDANSETRON 4 MG PO TBDP
4.0000 mg | ORAL_TABLET | Freq: Once | ORAL | Status: AC
  Administered 2020-12-19: 4 mg via ORAL
  Filled 2020-12-19: qty 1

## 2020-12-19 MED ORDER — SODIUM CHLORIDE 0.9 % IV BOLUS
500.0000 mL | Freq: Once | INTRAVENOUS | Status: AC
  Administered 2020-12-19: 500 mL via INTRAVENOUS

## 2020-12-19 MED ORDER — MORPHINE SULFATE (PF) 2 MG/ML IV SOLN
2.0000 mg | Freq: Once | INTRAVENOUS | Status: AC
  Administered 2020-12-19: 2 mg via INTRAVENOUS
  Filled 2020-12-19: qty 1

## 2020-12-19 MED ORDER — ONDANSETRON HCL 4 MG/2ML IJ SOLN
4.0000 mg | Freq: Once | INTRAMUSCULAR | Status: AC
  Administered 2020-12-19: 4 mg via INTRAVENOUS
  Filled 2020-12-19: qty 2

## 2020-12-19 NOTE — ED Notes (Signed)
Straight cath performed with PA student. Urine sample obtained.

## 2020-12-19 NOTE — ED Triage Notes (Signed)
Pt c/o aching all over with nausea that began this AM. No vomiting.  A&O, in wheelchair. Denies fever, cough, SOB. Denies sick contacts.

## 2020-12-19 NOTE — Discharge Instructions (Addendum)
Take over-the-counter vitamin C, vitamin D, and zinc to help boost your immune system Take over-the-counter Mucinex for cough as needed Take over-the-counter Tylenol and ibuprofen for fever and body aches Follow-up with your regular doctor as needed Return emergency department worsening Quarantine for 5 days if you are vaccinated and 10 days if you are not.  Quarantine is from onset of symptoms.  At that time you may return to school/work if your symptoms have improved and you have been fever free for 24 hours.  

## 2020-12-19 NOTE — ED Notes (Signed)
First Nurse Note: Pt to ED via POV for abd pain, N/V/D. Pt is in NAD.

## 2020-12-19 NOTE — ED Notes (Signed)
Assisted pt to toilet- pt unable to urinate sample. Pt c/o generalized body aches. Daughter at bedside updated.

## 2020-12-19 NOTE — ED Notes (Signed)
X-ray at bedside

## 2020-12-19 NOTE — ED Provider Notes (Signed)
[facility] Emergency Department Provider Note  ____________________________________________   Event Date/Time   First MD Initiated Contact with Patient 12/19/20 1342     (approximate)  I have reviewed the triage vital signs and the nursing notes.   HISTORY  Chief Complaint Generalized Body Aches   HPI Tanya Cole is a 87 y.o. female presents to the ED with complaint of generalized body aches, nausea, vomiting and loose stools that began last evening.  Patient states that she has vomited twice today.  She denies any chills, cough, change in taste or smell.  Patient is unvaccinated.  She reports that she lives at home but that her daughter does come and bring her grocery items.  She also has had urinary tract infections at past.  She rates her pain as an 8 out of 10.     Past Medical History:  Diagnosis Date  . Aortic insufficiency    a. TTE 12/19: EF 55-60%, probable HK of the mid apical anterior septal myocardium, Gr1DD, mild AI, mildly dilated LA  . Asthma   . CAD (coronary artery disease)    a. NSTEMI 12/19; b. LHC 10/08/18: LM minimal luminal irregs, mLAD-1 95% s/p PCI/DES, mLAD-2 60%, LCx mild diffuse disease throughout, RCA minimal luminal irregs; b. 03/2020 MV: EF>65%, no ischemia/scar.  . CHF (congestive heart failure) (HCC)   . Diabetes mellitus (HCC)   . Hypercholesterolemia   . Hypertension   . Myocardial infarction (HCC)   . Osteopenia   . Palpitations    a. 04/2020 Zio: Avg HR 75. 429 SVT episodes, longest 19 secs @ 133. Occas PACs (3.2%). Rare PVCs (<1%).  . Polymyalgia rheumatica syndrome (HCC)   . Reactive airway disease     Patient Active Problem List   Diagnosis Date Noted  . COPD exacerbation (HCC) 11/29/2020  . Chronic diastolic CHF (congestive heart failure) (HCC) 11/29/2020  . CAP (community acquired pneumonia) 11/29/2020  . Rib pain on right side 10/19/2020  . Abnormal CXR 10/19/2020  . Type 2 diabetes mellitus  without complication (HCC) 10/08/2020  . Elevated troponin 10/08/2020  . Acute on chronic heart failure with preserved ejection fraction (HFpEF) (HCC) 10/08/2020  . COPD with acute exacerbation (HCC) 08/19/2020  . History of non-ST elevation myocardial infarction (NSTEMI) 08/07/2020  . Asthma 08/07/2020  . Leukocytosis 08/07/2020  . Left shoulder pain 07/12/2020  . Iron deficiency 04/16/2020  . Anemia 04/04/2020  . SOB (shortness of breath) 03/25/2020  . Cough 09/30/2019  . Gallstone pancreatitis   . Pre-op evaluation 08/30/2019  . Cholecystitis 08/05/2019  . Choledocholithiasis   . Respiratory illness 06/18/2019  . Coronary artery disease 12/29/2018  . Chest pain 10/07/2018  . Memory change 05/27/2018  . Osteoporosis 02/05/2018  . Weight loss 06/24/2017  . Abdominal pain, left lower quadrant 10/05/2016  . Long term current use of systemic steroids 05/16/2016  . Elevated erythrocyte sedimentation rate 05/04/2016  . Back pain 04/29/2016  . Fatigue 05/24/2015  . Health care maintenance 01/25/2015  . UTI (urinary tract infection) 07/07/2014  . Neuropathy 03/24/2014  . Diverticulitis 02/24/2013  . Reactive airway disease 09/15/2012  . Hypertension 09/14/2012  . Hypercholesterolemia 09/14/2012  . Diabetes mellitus with cardiac complication (HCC) 09/14/2012    Past Surgical History:  Procedure Laterality Date  . ABDOMINAL HYSTERECTOMY  1981   prolapse and bleeding, ovaries not removed  . BREAST EXCISIONAL BIOPSY Right   . CHOLECYSTECTOMY N/A 09/02/2019   Procedure: LAPAROSCOPIC CHOLECYSTECTOMY WITH INTRAOPERATIVE CHOLANGIOGRAM;  Surgeon: Aleen Campi,  Elita Quick, MD;  Location: [facility] ORS;  Service: General;  Laterality: N/A;  . CORONARY STENT INTERVENTION N/A 10/08/2018   Procedure: CORONARY STENT INTERVENTION;  Surgeon: Iran Ouch, MD;  Location: [facility];  Service: Cardiovascular;  Laterality: N/A;  . ENDOSCOPIC RETROGRADE CHOLANGIOPANCREATOGRAPHY (ERCP) WITH PROPOFOL  N/A 08/08/2019   Procedure: ENDOSCOPIC RETROGRADE CHOLANGIOPANCREATOGRAPHY (ERCP) WITH PROPOFOL;  Surgeon: Midge Minium, MD;  Location: [facility];  Service: Endoscopy;  Laterality: N/A;  . LEFT HEART CATH AND CORONARY ANGIOGRAPHY N/A 10/08/2018   Procedure: LEFT HEART CATH AND CORONARY ANGIOGRAPHY;  Surgeon: Iran Ouch, MD;  Location: [facility];  Service: Cardiovascular;  Laterality: N/A;  . LEFT HEART CATH AND CORONARY ANGIOGRAPHY N/A 08/10/2020   Procedure: LEFT HEART CATH AND CORONARY ANGIOGRAPHY possible percutaneous intervention;  Surgeon: Iran Ouch, MD;  Location: [facility];  Service: Cardiovascular;  Laterality: N/A;  . UMBILICAL HERNIA REPAIR  7/94    Prior to Admission medications   Medication Sig Start Date End Date Taking? Authorizing Provider  ADVAIR DISKUS 250-50 MCG/DOSE AEPB INHALE ONE PUFF BY MOUTH EVERY 12 HOURS. RINSE MOUTH AFTER EACH USE Patient taking differently: Inhale 1 puff into the lungs 2 (two) times daily. 02/17/16   Dale Donaldson, MD  albuterol (PROVENTIL) (2.5 MG/3ML) 0.083% nebulizer solution USE 1 VIAL IN NEBULIZER EVERY 4 HOURS AS NEEDED FOR WHEEZING OR SHORTNESS OF BREATH Patient taking differently: Take 2.5 mg by nebulization in the morning and at bedtime. 11/25/20   Dale [place], MD  alendronate (FOSAMAX) 70 MG tablet Take 70 mg by mouth once a week.  11/06/17   [provider]  aspirin 81 MG tablet Take 81 mg by mouth daily.    [provider]  atorvastatin (LIPITOR) 80 MG tablet Take 1 tablet (80 mg total) by mouth daily at 6 PM. 04/01/20   Alver Sorrow, NP  fluticasone (FLONASE) 50 MCG/ACT nasal spray Use 2 spray(s) in each nostril once daily Patient taking differently: Place 2 sprays into both nostrils daily. 08/03/20   Dale Lester, MD  isosorbide mononitrate (IMDUR) 30 MG 24 hr tablet Take 1 tablet (30 mg total) by mouth daily. 08/11/20   Arnetha Courser, MD  lisinopril (ZESTRIL) 20 MG tablet  Take 1 tablet by mouth once daily 11/17/20   Dunn, Raymon Mutton, PA-C  metFORMIN (GLUCOPHAGE) 500 MG tablet TAKE 1 TABLET BY MOUTH TWICE DAILY WITH A MEAL Patient taking differently: Take 500 mg by mouth 2 (two) times daily with a meal. 09/29/20   Dale , MD  metoprolol succinate (TOPROL-XL) 50 MG 24 hr tablet Take 1 tablet (50 mg total) by mouth daily. Take with or immediately following a meal. 08/21/20   Rolly Salter, MD  metoprolol tartrate (LOPRESSOR) 25 MG tablet Take 1 tablet (25 mg total) by mouth daily as needed (if Heart rate is >120). 08/20/20   Rolly Salter, MD  Nebulizers (COMPRESSOR/NEBULIZER) MISC 1 Units by Does not apply route as directed. 04/07/20   Dionne Bucy, MD  nitroGLYCERIN (NITROSTAT) 0.4 MG SL tablet Place 1 tablet (0.4 mg total) under the tongue every 5 (five) minutes as needed for chest pain. 10/09/18   Adrian Saran, MD  PROAIR HFA 108 (90 Base) MCG/ACT inhaler Inhale 2 puffs into the lungs every 4 (four) hours as needed for wheezing or shortness of breath. 08/20/20   Rolly Salter, MD  Vitamin D, Ergocalciferol, (DRISDOL) 50000 units CAPS capsule Take 50,000 Units by mouth once a week.  06/24/18   [provider]    Allergies Tramadol  Family History  Problem Relation Age of Onset  . Heart attack Father   . Arthritis Mother   . Heart disease Mother   . Throat cancer Sister   . Parkinson's disease Sister   . COPD Brother     Social History Social History   Tobacco Use  . Smoking status: Never Smoker  . Smokeless tobacco: Never Used  Vaping Use  . Vaping Use: Never used  Substance Use Topics  . Alcohol use: No    Alcohol/week: 0.0 standard drinks  . Drug use: No    Review of Systems Constitutional: No fever/chills Eyes: No visual changes. ENT: No sore throat. Cardiovascular: Denies chest pain. Respiratory: Denies shortness of breath.  Negative for cough. Gastrointestinal: No abdominal pain.  Positive nausea, positive  vomiting.  Positive diarrhea.  No constipation. Genitourinary: Negative for dysuria. Musculoskeletal: Positive for generalized body aches. Skin: Negative for rash. Neurological: Negative for headaches, focal weakness or numbness. ____________________________________________   PHYSICAL EXAM:  VITAL SIGNS: ED Triage Vitals  Enc Vitals Group     BP 12/19/20 1314 132/62     Pulse Rate 12/19/20 1314 83     Resp 12/19/20 1314 16     Temp 12/19/20 1314 98.6 F (37 C)     Temp Source 12/19/20 1314 Oral     SpO2 12/19/20 1314 97 %     Weight 12/19/20 1315 118 lb (53.5 kg)     Height 12/19/20 1315 5\' 1"  (1.549 m)     Head Circumference --      Peak Flow --      Pain Score 12/19/20 1315 8     Pain Loc --      Pain Edu? --      Excl. in GC? --    Constitutional: Alert and oriented. Well appearing and in no acute distress. Eyes: Conjunctivae are normal.  Head: Atraumatic. Nose: No congestion/rhinnorhea. Neck: No stridor.   Cardiovascular: Normal rate, regular rhythm. Grossly normal heart sounds.  Good peripheral circulation. Respiratory: Normal respiratory effort.  No retractions. Lungs CTAB. Gastrointestinal: Soft and nontender. No distention.  Bowel sounds normoactive x4 quadrants. Musculoskeletal: No lower extremity tenderness nor edema.  No joint effusions. Neurologic:  Normal speech and language. No gross focal neurologic deficits are appreciated. No gait instability. Skin:  Skin is warm, dry and intact. No rash noted. Psychiatric: Mood and affect are normal. Speech and behavior are normal.  ____________________________________________   LABS (all labs ordered are listed, but only abnormal results are displayed)  Labs Reviewed  RESP PANEL BY RT-PCR (FLU A&B, COVID) ARPGX2 - Abnormal; Notable for the following components:      Result Value   SARS Coronavirus 2 by RT PCR POSITIVE (*)    All other components within normal limits  URINALYSIS, COMPLETE (UACMP) WITH MICROSCOPIC   CBC WITH DIFFERENTIAL/PLATELET  COMPREHENSIVE METABOLIC PANEL  TROPONIN I (HIGH SENSITIVITY)   ____________________________________________  EKG   ____________________________________________  RADIOLOGY 12/21/20, personally viewed and evaluated these images (plain radiographs) as part of my medical decision making, as well as reviewing the written report by the radiologist.  ED MD interpretation:   Official radiology report(s): No results found.  ____________________________________________   PROCEDURES  Procedure(s) performed (including Critical Care):  Procedures   ____________________________________________   INITIAL IMPRESSION / ASSESSMENT AND PLAN / ED COURSE  As part of my medical decision making, I reviewed the following data within  the electronic MEDICAL RECORD NUMBER Notes from prior ED visits and [place] Controlled Substance Database ----------------------------------------- 4:22 PM on 12/19/2020 -----------------------------------------   87 year old female presents to the ED with complaint of generalized body aches, nausea, vomiting and loose stools since yesterday.  Patient is unvaccinated and is unaware of any known exposure to Covid.  Notification from lab reports patient is positive for Covid.  Because of her age and having diabetes, hypertension, history of asthma, COPD, history of non-STEMI lab work was ordered to further evaluate patient along with chest x-ray.  At the time of this writing O2 sat is 97% and patient is afebrile.  Patient is being turned over to Greig Right, PA-C for the remainder of her work-up.  ____________________________________________   FINAL CLINICAL IMPRESSION(S) / ED DIAGNOSES  Final diagnoses:  COVID     ED Discharge Orders    None      *Please note:  Tanya Cole was evaluated in Emergency Department on 12/19/2020 for the symptoms described in the history of present illness. She was evaluated in the  context of the global COVID-19 pandemic, which necessitated consideration that the patient might be at risk for infection with the SARS-CoV-2 virus that causes COVID-19. Institutional protocols and algorithms that pertain to the evaluation of patients at risk for COVID-19 are in a state of rapid change based on information released by regulatory bodies including the CDC and federal and state organizations. These policies and algorithms were followed during the patient's care in the ED.  Some ED evaluations and interventions may be delayed as a result of limited staffing during and the pandemic.*   Note:  This document was prepared using Dragon voice recognition software and may include unintentional dictation errors.    Tommi Rumps, PA-C 12/20/20 1158    Jene Every, MD 12/20/20 2253

## 2020-12-19 NOTE — ED Notes (Signed)
Attempted IV x2 without success  

## 2020-12-19 NOTE — ED Provider Notes (Signed)
Excepting care from our [place], [place].  Patient came in with generalized body aches.  Unvaccinated for Covid.   Physical Exam  BP (!) 112/54   Pulse 79   Temp 98.6 F (37 C) (Oral)   Resp 17   Ht 5\' 1"  (1.549 m)   Wt 53.5 kg   SpO2 96%   BMI 22.30 kg/m   Physical Exam  ED Course/Procedures     Procedures  MDM   Patient is positive for Covid, chest x-ray is normal, CBC is normal, troponins normal, comprehensive metabolic panel shows a increase in her BUN and creatinine, however feel the patient is very dehydrated and this should be corrected with fluids.  I spoke with the daughter about having the patient go to the Covid infusion center due to her age, comorbidities, and being unvaccinated.  States they will find a way to get her there.  At this time we are still awaiting urinalysis results and if negative patient will be discharged.   UA is normal.  Patient is discharged stable condition care of her daughter.  They are to follow-up with the ambulatory Covid infusion center.    , PA-C 12/19/20 12/21/20    Izell [place], MD 12/20/20 [phone]

## 2020-12-19 NOTE — ED Notes (Signed)
See triage note  Presents with generalized body aches   States sx's started this am  No fever

## 2020-12-20 ENCOUNTER — Other Ambulatory Visit: Payer: Self-pay | Admitting: Physician Assistant

## 2020-12-20 ENCOUNTER — Other Ambulatory Visit: Payer: Self-pay | Admitting: Family

## 2020-12-20 ENCOUNTER — Other Ambulatory Visit (HOSPITAL_COMMUNITY): Payer: Self-pay | Admitting: Physician Assistant

## 2020-12-20 MED ORDER — MOLNUPIRAVIR EUA 200MG CAPSULE: 4.0000 | ORAL_CAPSULE | Freq: Two times a day (BID) | ORAL | 0 refills | Status: AC

## 2020-12-20 NOTE — Progress Notes (Signed)
Outpatient Oral COVID Treatment Note  I connected with [facility] on 12/20/2020/8:50 AM by telephone and verified that I am speaking with the correct person using two identifiers.  I discussed the limitations, risks, security, and privacy concerns of performing an evaluation and management service by telephone and the availability of in person appointments. I also discussed with the patient that there may be a patient responsible charge related to this service. The patient expressed understanding and agreed to proceed.  Patient location: home Provider location: office  Diagnosis: COVID-19 infection  Purpose of visit: Discussion of potential use of Molnupiravir or Paxlovid, a new treatment for mild to moderate COVID-19 viral infection in non-hospitalized patients.   Subjective: Patient is a 87 y.o. female who has been diagnosed with COVID 19 viral infection.  Their symptoms began on 2/18 with body aches, fevers and chills. Pt is unvaccinated.     Past Medical History:  Diagnosis Date  . Aortic insufficiency    a. TTE 12/19: EF 55-60%, probable HK of the mid apical anterior septal myocardium, Gr1DD, mild AI, mildly dilated LA  . Asthma   . CAD (coronary artery disease)    a. NSTEMI 12/19; b. LHC 10/08/18: LM minimal luminal irregs, mLAD-1 95% s/p PCI/DES, mLAD-2 60%, LCx mild diffuse disease throughout, RCA minimal luminal irregs; b. 03/2020 MV: EF>65%, no ischemia/scar.  . CHF (congestive heart failure) (HCC)   . Diabetes mellitus (HCC)   . Hypercholesterolemia   . Hypertension   . Myocardial infarction (HCC)   . Osteopenia   . Palpitations    a. 04/2020 Zio: Avg HR 75. 429 SVT episodes, longest 19 secs @ 133. Occas PACs (3.2%). Rare PVCs (<1%).  . Polymyalgia rheumatica syndrome (HCC)   . Reactive airway disease     Allergies  Allergen Reactions  . Tramadol Itching and Nausea And Vomiting     Current Outpatient Medications:  .  ADVAIR DISKUS 250-50 MCG/DOSE AEPB, INHALE ONE  PUFF BY MOUTH EVERY 12 HOURS. RINSE MOUTH AFTER EACH USE (Patient taking differently: Inhale 1 puff into the lungs 2 (two) times daily.), Disp: 60 each, Rfl: 5 .  albuterol (PROVENTIL) (2.5 MG/3ML) 0.083% nebulizer solution, USE 1 VIAL IN NEBULIZER EVERY 4 HOURS AS NEEDED FOR WHEEZING OR SHORTNESS OF BREATH (Patient taking differently: Take 2.5 mg by nebulization in the morning and at bedtime.), Disp: 180 mL, Rfl: 0 .  alendronate (FOSAMAX) 70 MG tablet, Take 70 mg by mouth once a week. , Disp: , Rfl:  .  aspirin 81 MG tablet, Take 81 mg by mouth daily., Disp: , Rfl:  .  atorvastatin (LIPITOR) 80 MG tablet, Take 1 tablet (80 mg total) by mouth daily at 6 PM., Disp: 90 tablet, Rfl: 3 .  fluticasone (FLONASE) 50 MCG/ACT nasal spray, Use 2 spray(s) in each nostril once daily (Patient taking differently: Place 2 sprays into both nostrils daily.), Disp: 16 g, Rfl: 0 .  isosorbide mononitrate (IMDUR) 30 MG 24 hr tablet, Take 1 tablet (30 mg total) by mouth daily., Disp: 30 tablet, Rfl: 0 .  lisinopril (ZESTRIL) 20 MG tablet, Take 1 tablet by mouth once daily, Disp: 90 tablet, Rfl: 0 .  metFORMIN (GLUCOPHAGE) 500 MG tablet, TAKE 1 TABLET BY MOUTH TWICE DAILY WITH A MEAL (Patient taking differently: Take 500 mg by mouth 2 (two) times daily with a meal.), Disp: 180 tablet, Rfl: 0 .  metoprolol succinate (TOPROL-XL) 50 MG 24 hr tablet, Take 1 tablet (50 mg total) by mouth daily. Take  with or immediately following a meal., Disp: 30 tablet, Rfl: 0 .  metoprolol tartrate (LOPRESSOR) 25 MG tablet, Take 1 tablet (25 mg total) by mouth daily as needed (if Heart rate is >120)., Disp: 30 tablet, Rfl: 0 .  Nebulizers (COMPRESSOR/NEBULIZER) MISC, 1 Units by Does not apply route as directed., Disp: 1 each, Rfl: 0 .  nitroGLYCERIN (NITROSTAT) 0.4 MG SL tablet, Place 1 tablet (0.4 mg total) under the tongue every 5 (five) minutes as needed for chest pain., Disp: 30 tablet, Rfl: 12 .  PROAIR HFA 108 (90 Base) MCG/ACT inhaler,  Inhale 2 puffs into the lungs every 4 (four) hours as needed for wheezing or shortness of breath., Disp: 18 g, Rfl: 0 .  Vitamin D, Ergocalciferol, (DRISDOL) 50000 units CAPS capsule, Take 50,000 Units by mouth once a week., Disp: , Rfl: 3  Objective: Patient sounds okay on phone.  They are in no apparent distress.  Breathing is non labored.  Mood and behavior are normal.  Laboratory Data:  Recent Results (from the past 2160 hour(s))  Magnesium     Status: None   Collection Time: 10/08/20  9:12 PM  Result Value Ref Range   Magnesium 2.3 1.7 - 2.4 mg/dL    Comment: Performed at [facility], [address] [place], [place] [address]  Troponin I (High Sensitivity)     Status: Abnormal   Collection Time: 10/08/20  9:12 PM  Result Value Ref Range   Troponin I (High Sensitivity) 35 (H) <18 ng/L    Comment: (NOTE) Elevated high sensitivity troponin I (hsTnI) values and significant  changes across serial measurements may suggest ACS but many other  chronic and acute conditions are known to elevate hsTnI results.  Refer to the "Links" section for chest pain algorithms and additional  guidance. Performed at [facility], [address] [place], [place] [address]   Comprehensive metabolic panel     Status: Abnormal   Collection Time: 10/08/20  9:12 PM  Result Value Ref Range   Sodium 139 135 - 145 mmol/L   Potassium 4.5 3.5 - 5.1 mmol/L    Comment: HEMOLYSIS AT THIS LEVEL MAY AFFECT RESULT   Chloride 106 98 - 111 mmol/L   CO2 21 (L) 22 - 32 mmol/L   Glucose, Bld 204 (H) 70 - 99 mg/dL    Comment: Glucose reference range applies only to samples taken after fasting for at least 8 hours.   BUN 19 8 - 23 mg/dL   Creatinine, Ser 1.91 0.44 - 1.00 mg/dL   Calcium 9.1 8.9 - 47.8 mg/dL   Total Protein 7.7 6.5 - 8.1 g/dL   Albumin 4.2 3.5 - 5.0 g/dL   AST 20 15 - 41 U/L   ALT 14 0 - 44 U/L   Alkaline Phosphatase 79 38 - 126 U/L   Total Bilirubin 0.7 0.3 - 1.2 mg/dL   GFR,  Estimated >29 >56 mL/min    Comment: (NOTE) Calculated using the CKD-EPI Creatinine Equation (2021)    Anion gap 12 5 - 15    Comment: Performed at [facility], [address], [place], [place] [address]  CBC with Differential/Platelet     Status: Abnormal   Collection Time: 10/08/20  9:12 PM  Result Value Ref Range   WBC 12.5 (H) 4.0 - 10.5 K/uL   RBC 4.67 3.87 - 5.11 MIL/uL   Hemoglobin 13.7 12.0 - 15.0 g/dL   HCT 65.7 84.6 - 96.2 %   MCV 88.9  80.0 - 100.0 fL   MCH 29.3 26.0 - 34.0 pg   MCHC 33.0 30.0 - 36.0 g/dL   RDW 44.0 10.2 - 72.5 %   Platelets 247 150 - 400 K/uL   nRBC 0.0 0.0 - 0.2 %   Neutrophils Relative % 90 %   Neutro Abs 11.0 (H) 1.7 - 7.7 K/uL   Lymphocytes Relative 6 %   Lymphs Abs 0.8 0.7 - 4.0 K/uL   Monocytes Relative 1 %   Monocytes Absolute 0.2 0.1 - 1.0 K/uL   Eosinophils Relative 3 %   Eosinophils Absolute 0.4 0.0 - 0.5 K/uL   Basophils Relative 0 %   Basophils Absolute 0.1 0.0 - 0.1 K/uL   Immature Granulocytes 0 %   Abs Immature Granulocytes 0.05 0.00 - 0.07 K/uL    Comment: Performed at [facility], [address] [place], [place] [address]  Brain natriuretic peptide     Status: Abnormal   Collection Time: 10/08/20  9:12 PM  Result Value Ref Range   B Natriuretic Peptide 238.3 (H) 0.0 - 100.0 pg/mL    Comment: Performed at [facility], [address], [place], [place] [address]  Resp Panel by RT-PCR (Flu A&B, Covid) Nasopharyngeal Swab     Status: None   Collection Time: 10/08/20  9:15 PM   Specimen: Nasopharyngeal Swab; Nasopharyngeal(NP) swabs in vial transport medium  Result Value Ref Range   SARS Coronavirus 2 by RT PCR NEGATIVE NEGATIVE    Comment: (NOTE) SARS-CoV-2 target nucleic acids are NOT DETECTED.  The SARS-CoV-2 RNA is generally detectable in upper respiratory specimens during the acute phase of infection. The lowest concentration of SARS-CoV-2 viral copies this assay can detect is 138 copies/mL.  A negative result does not preclude SARS-Cov-2 infection and should not be used as the sole basis for treatment or other patient management decisions. A negative result may occur with  improper specimen collection/handling, submission of specimen other than nasopharyngeal swab, presence of viral mutation(s) within the areas targeted by this assay, and inadequate number of viral copies(<138 copies/mL). A negative result must be combined with clinical observations, patient history, and epidemiological information. The expected result is Negative.  Fact Sheet for Patients:  BloggerCourse.com  Fact Sheet for Healthcare Providers:  SeriousBroker.it  This test is no t yet approved or cleared by the [place] FDA and  has been authorized for detection and/or diagnosis of SARS-CoV-2 by FDA under an Emergency Use Authorization (EUA). This EUA will remain  in effect (meaning this test can be used) for the duration of the COVID-19 declaration under Section 564(b)(1) of the Act, 21 U.S.C.section 360bbb-3(b)(1), unless the authorization is terminated  or revoked sooner.       Influenza A by PCR NEGATIVE NEGATIVE   Influenza B by PCR NEGATIVE NEGATIVE    Comment: (NOTE) The Xpert Xpress SARS-CoV-2/FLU/RSV plus assay is intended as an aid in the diagnosis of influenza from Nasopharyngeal swab specimens and should not be used as a sole basis for treatment. Nasal washings and aspirates are unacceptable for Xpert Xpress SARS-CoV-2/FLU/RSV testing.  Fact Sheet for Patients: BloggerCourse.com  Fact Sheet for Healthcare Providers: SeriousBroker.it  This test is not yet approved or cleared by the [place] FDA and has been authorized for detection and/or diagnosis of SARS-CoV-2 by FDA under an Emergency Use Authorization (EUA). This EUA will remain in effect (meaning this test can be used)  for the duration of the COVID-19 declaration under Section 564(b)(1) of the Act,  21 U.S.C. section 360bbb-3(b)(1), unless the authorization is terminated or revoked.  Performed at [facility], [address] [place], [place] [address]   Troponin I (High Sensitivity)     Status: Abnormal   Collection Time: 10/08/20 11:47 PM  Result Value Ref Range   Troponin I (High Sensitivity) 40 (H) <18 ng/L    Comment: (NOTE) Elevated high sensitivity troponin I (hsTnI) values and significant  changes across serial measurements may suggest ACS but many other  chronic and acute conditions are known to elevate hsTnI results.  Refer to the "Links" section for chest pain algorithms and additional  guidance. Performed at [facility], [address] [place], [place] [address]   Hemoglobin A1c     Status: Abnormal   Collection Time: 10/08/20 11:47 PM  Result Value Ref Range   Hgb A1c MFr Bld 6.3 (H) 4.8 - 5.6 %    Comment: (NOTE) Pre diabetes:          5.7%-6.4%  Diabetes:              >6.4%  Glycemic control for   <7.0% adults with diabetes    Mean Plasma Glucose 134.11 mg/dL    Comment: Performed at [facility], [address] [place], [place] [address]  HIV Antibody (routine testing w rflx)     Status: None   Collection Time: 10/08/20 11:47 PM  Result Value Ref Range   HIV Screen 4th Generation wRfx Non Reactive Non Reactive    Comment: Performed at [facility], [address] [place], [place] [address]  CBC     Status: Abnormal   Collection Time: 10/08/20 11:47 PM  Result Value Ref Range   WBC 11.7 (H) 4.0 - 10.5 K/uL   RBC 4.57 3.87 - 5.11 MIL/uL   Hemoglobin 13.1 12.0 - 15.0 g/dL   HCT 29.5 62.1 - 30.8 %   MCV 89.9 80.0 - 100.0 fL   MCH 28.7 26.0 - 34.0 pg   MCHC 31.9 30.0 - 36.0 g/dL   RDW 65.7 84.6 - 96.2 %   Platelets 251 150 - 400 K/uL   nRBC 0.0 0.0 - 0.2 %    Comment: Performed at [facility], [address]  [place], [place] [address]  Creatinine, serum     Status: None   Collection Time: 10/08/20 11:47 PM  Result Value Ref Range   Creatinine, Ser 0.83 0.44 - 1.00 mg/dL   GFR, Estimated >13 >24 mL/min    Comment: (NOTE) Calculated using the CKD-EPI Creatinine Equation (2021) Performed at [facility], [address] [place], [place] [address]   CBG monitoring, ED     Status: Abnormal   Collection Time: 10/08/20 11:56 PM  Result Value Ref Range   Glucose-Capillary 232 (H) 70 - 99 mg/dL    Comment: Glucose reference range applies only to samples taken after fasting for at least 8 hours.  Basic metabolic panel     Status: Abnormal   Collection Time: 10/09/20  4:02 AM  Result Value Ref Range   Sodium 139 135 - 145 mmol/L   Potassium 4.1 3.5 - 5.1 mmol/L   Chloride 105 98 - 111 mmol/L   CO2 21 (L) 22 - 32 mmol/L   Glucose, Bld 348 (H) 70 - 99 mg/dL    Comment: Glucose reference range applies only to samples taken after fasting for at least 8 hours.   BUN 22 8 - 23 mg/dL  Creatinine, Ser 1.04 (H) 0.44 - 1.00 mg/dL   Calcium 9.2 8.9 - 13.2 mg/dL   GFR, Estimated 53 (L) >60 mL/min    Comment: (NOTE) Calculated using the CKD-EPI Creatinine Equation (2021)    Anion gap 13 5 - 15    Comment: Performed at [facility], [address] [place], [place] [address]  Glucose, capillary     Status: Abnormal   Collection Time: 10/09/20  6:11 PM  Result Value Ref Range   Glucose-Capillary 195 (H) 70 - 99 mg/dL    Comment: Glucose reference range applies only to samples taken after fasting for at least 8 hours.  Glucose, capillary     Status: Abnormal   Collection Time: 10/09/20  8:55 PM  Result Value Ref Range   Glucose-Capillary 153 (H) 70 - 99 mg/dL    Comment: Glucose reference range applies only to samples taken after fasting for at least 8 hours.  Basic metabolic panel     Status: Abnormal   Collection Time: 10/10/20  4:51 AM  Result Value Ref Range   Sodium 138 135 - 145  mmol/L   Potassium 3.9 3.5 - 5.1 mmol/L   Chloride 101 98 - 111 mmol/L   CO2 24 22 - 32 mmol/L   Glucose, Bld 211 (H) 70 - 99 mg/dL    Comment: Glucose reference range applies only to samples taken after fasting for at least 8 hours.   BUN 42 (H) 8 - 23 mg/dL   Creatinine, Ser 2.72 0.44 - 1.00 mg/dL   Calcium 9.4 8.9 - 53.6 mg/dL   GFR, Estimated >64 >40 mL/min    Comment: (NOTE) Calculated using the CKD-EPI Creatinine Equation (2021)    Anion gap 13 5 - 15    Comment: Performed at [facility], [address] [place], [place] [address]  Glucose, capillary     Status: Abnormal   Collection Time: 10/10/20  8:34 AM  Result Value Ref Range   Glucose-Capillary 266 (H) 70 - 99 mg/dL    Comment: Glucose reference range applies only to samples taken after fasting for at least 8 hours.  Glucose, capillary     Status: Abnormal   Collection Time: 10/10/20  1:03 PM  Result Value Ref Range   Glucose-Capillary 258 (H) 70 - 99 mg/dL    Comment: Glucose reference range applies only to samples taken after fasting for at least 8 hours.  Glucose, capillary     Status: Abnormal   Collection Time: 10/10/20  5:37 PM  Result Value Ref Range   Glucose-Capillary 251 (H) 70 - 99 mg/dL    Comment: Glucose reference range applies only to samples taken after fasting for at least 8 hours.  Glucose, capillary     Status: Abnormal   Collection Time: 10/10/20  8:53 PM  Result Value Ref Range   Glucose-Capillary 156 (H) 70 - 99 mg/dL    Comment: Glucose reference range applies only to samples taken after fasting for at least 8 hours.  Basic metabolic panel     Status: Abnormal   Collection Time: 10/11/20  3:53 AM  Result Value Ref Range   Sodium 137 135 - 145 mmol/L   Potassium 3.7 3.5 - 5.1 mmol/L   Chloride 104 98 - 111 mmol/L   CO2 23 22 - 32 mmol/L   Glucose, Bld 152 (H) 70 - 99 mg/dL    Comment: Glucose reference range applies only to samples taken after fasting for at least 8 hours.  BUN  49 (H) 8 - 23 mg/dL   Creatinine, Ser 3.66 0.44 - 1.00 mg/dL   Calcium 9.1 8.9 - 44.0 mg/dL   GFR, Estimated >34 >74 mL/min    Comment: (NOTE) Calculated using the CKD-EPI Creatinine Equation (2021)    Anion gap 10 5 - 15    Comment: Performed at [facility], [address] [place], [place] [address]  CBC     Status: Abnormal   Collection Time: 10/11/20  3:53 AM  Result Value Ref Range   WBC 21.2 (H) 4.0 - 10.5 K/uL   RBC 4.43 3.87 - 5.11 MIL/uL   Hemoglobin 12.6 12.0 - 15.0 g/dL   HCT 38.7 56.4 - 33.2 %   MCV 86.9 80.0 - 100.0 fL   MCH 28.4 26.0 - 34.0 pg   MCHC 32.7 30.0 - 36.0 g/dL   RDW 95.1 88.4 - 16.6 %   Platelets 301 150 - 400 K/uL   nRBC 0.0 0.0 - 0.2 %    Comment: Performed at [facility], [address] [place], [place] [address]  Glucose, capillary     Status: Abnormal   Collection Time: 10/11/20  8:04 AM  Result Value Ref Range   Glucose-Capillary 129 (H) 70 - 99 mg/dL    Comment: Glucose reference range applies only to samples taken after fasting for at least 8 hours.  Glucose, capillary     Status: Abnormal   Collection Time: 10/11/20 11:17 AM  Result Value Ref Range   Glucose-Capillary 166 (H) 70 - 99 mg/dL    Comment: Glucose reference range applies only to samples taken after fasting for at least 8 hours.  Glucose, capillary     Status: Abnormal   Collection Time: 10/11/20  4:26 PM  Result Value Ref Range   Glucose-Capillary 238 (H) 70 - 99 mg/dL    Comment: Glucose reference range applies only to samples taken after fasting for at least 8 hours.  Glucose, capillary     Status: Abnormal   Collection Time: 10/16/20 10:36 AM  Result Value Ref Range   Glucose-Capillary 149 (H) 70 - 99 mg/dL    Comment: Glucose reference range applies only to samples taken after fasting for at least 8 hours.  HM DIABETES FOOT EXAM     Status: None   Collection Time: 10/19/20 12:00 AM  Result Value Ref Range   HM Diabetic Foot Exam on my exam    Hepatic function panel     Status: None   Collection Time: 10/19/20  3:14 PM  Result Value Ref Range   Total Bilirubin 0.5 0.2 - 1.2 mg/dL   Bilirubin, Direct 0.1 0.0 - 0.3 mg/dL   Alkaline Phosphatase 59 39 - 117 U/L   AST 15 0 - 37 U/L   ALT 14 0 - 35 U/L   Total Protein 6.7 6.0 - 8.3 g/dL   Albumin 4.1 3.5 - 5.2 g/dL  CBC with Differential/Platelet     Status: Abnormal   Collection Time: 10/19/20  3:14 PM  Result Value Ref Range   WBC 16.5 (H) 4.0 - 10.5 K/uL   RBC 5.09 3.87 - 5.11 Mil/uL   Hemoglobin 14.8 12.0 - 15.0 g/dL   HCT 60.1 09.3 - 23.5 %   MCV 88.1 78.0 - 100.0 fl   MCHC 32.9 30.0 - 36.0 g/dL   RDW 57.3 22.0 - 25.4 %   Platelets 253.0 150.0 - 400.0 K/uL   Neutrophils Relative % 70.7 43.0 - 77.0 %  Lymphocytes Relative 22.7 12.0 - 46.0 %   Monocytes Relative 5.3 3.0 - 12.0 %   Eosinophils Relative 0.9 0.0 - 5.0 %   Basophils Relative 0.4 0.0 - 3.0 %   Neutro Abs 11.6 (H) 1.4 - 7.7 K/uL   Lymphs Abs 3.7 0.7 - 4.0 K/uL   Monocytes Absolute 0.9 0.1 - 1.0 K/uL   Eosinophils Absolute 0.2 0.0 - 0.7 K/uL   Basophils Absolute 0.1 0.0 - 0.1 K/uL  Basic metabolic panel     Status: None   Collection Time: 10/19/20  3:14 PM  Result Value Ref Range   Sodium 137 135 - 145 mEq/L   Potassium 4.9 3.5 - 5.1 mEq/L   Chloride 100 96 - 112 mEq/L   CO2 30 19 - 32 mEq/L   Glucose, Bld 90 70 - 99 mg/dL   BUN 22 6 - 23 mg/dL   Creatinine, Ser 4.40 0.40 - 1.20 mg/dL   GFR 34.74 >25.95 mL/min    Comment: Calculated using the CKD-EPI Creatinine Equation (2021)   Calcium 9.6 8.4 - 10.5 mg/dL  CBC     Status: Abnormal   Collection Time: 11/29/20  9:51 AM  Result Value Ref Range   WBC 13.0 (H) 4.0 - 10.5 K/uL   RBC 5.20 (H) 3.87 - 5.11 MIL/uL   Hemoglobin 14.8 12.0 - 15.0 g/dL   HCT 63.8 75.6 - 43.3 %   MCV 87.7 80.0 - 100.0 fL   MCH 28.5 26.0 - 34.0 pg   MCHC 32.5 30.0 - 36.0 g/dL   RDW 29.5 18.8 - 41.6 %   Platelets 230 150 - 400 K/uL   nRBC 0.0 0.0 - 0.2 %    Comment:  Performed at [facility], [address] [place], [place] [address]  Comprehensive metabolic panel     Status: Abnormal   Collection Time: 11/29/20  9:51 AM  Result Value Ref Range   Sodium 141 135 - 145 mmol/L   Potassium 3.9 3.5 - 5.1 mmol/L   Chloride 105 98 - 111 mmol/L   CO2 23 22 - 32 mmol/L   Glucose, Bld 150 (H) 70 - 99 mg/dL    Comment: Glucose reference range applies only to samples taken after fasting for at least 8 hours.   BUN 11 8 - 23 mg/dL   Creatinine, Ser 1.60 0.44 - 1.00 mg/dL   Calcium 9.5 8.9 - 10.9 mg/dL   Total Protein 7.7 6.5 - 8.1 g/dL   Albumin 4.3 3.5 - 5.0 g/dL   AST 20 15 - 41 U/L   ALT 18 0 - 44 U/L   Alkaline Phosphatase 76 38 - 126 U/L   Total Bilirubin 1.1 0.3 - 1.2 mg/dL   GFR, Estimated >32 >35 mL/min    Comment: (NOTE) Calculated using the CKD-EPI Creatinine Equation (2021)    Anion gap 13 5 - 15    Comment: Performed at [facility], [address], [place], [place] [address]  Troponin I (High Sensitivity)     Status: None   Collection Time: 11/29/20  9:51 AM  Result Value Ref Range   Troponin I (High Sensitivity) 6 <18 ng/L    Comment: (NOTE) Elevated high sensitivity troponin I (hsTnI) values and significant  changes across serial measurements may suggest ACS but many other  chronic and acute conditions are known to elevate hsTnI results.  Refer to the "Links" section for chest pain algorithms and additional  guidance. Performed at [facility], [address], [place], [place] [address]   Brain natriuretic peptide     Status: None   Collection Time: 11/29/20  9:51 AM  Result Value Ref Range   B Natriuretic Peptide 67.5 0.0 - 100.0 pg/mL    Comment: Performed at [facility], [address] [place], [place] [address]  SARS Coronavirus 2 by RT PCR (hospital order, performed in [facility] hospital lab) Nasopharyngeal Nasopharyngeal Swab     Status: None   Collection Time: 11/29/20  9:51 AM    Specimen: Nasopharyngeal Swab  Result Value Ref Range   SARS Coronavirus 2 NEGATIVE NEGATIVE    Comment: (NOTE) SARS-CoV-2 target nucleic acids are NOT DETECTED.  The SARS-CoV-2 RNA is generally detectable in upper and lower respiratory specimens during the acute phase of infection. The lowest concentration of SARS-CoV-2 viral copies this assay can detect is 250 copies / mL. A negative result does not preclude SARS-CoV-2 infection and should not be used as the sole basis for treatment or other patient management decisions.  A negative result may occur with improper specimen collection / handling, submission of specimen other than nasopharyngeal swab, presence of viral mutation(s) within the areas targeted by this assay, and inadequate number of viral copies (<250 copies / mL). A negative result must be combined with clinical observations, patient history, and epidemiological information.  Fact Sheet for Patients:   BoilerBrush.com.cy  Fact Sheet for Healthcare Providers: https://pope.com/  This test is not yet approved or  cleared by the [place] FDA and has been authorized for detection and/or diagnosis of SARS-CoV-2 by FDA under an Emergency Use Authorization (EUA).  This EUA will remain in effect (meaning this test can be used) for the duration of the COVID-19 declaration under Section 564(b)(1) of the Act, 21 U.S.C. section 360bbb-3(b)(1), unless the authorization is terminated or revoked sooner.  Performed at [facility], [address] [place], [place] [address]   CBG monitoring, ED     Status: Abnormal   Collection Time: 11/29/20  9:35 PM  Result Value Ref Range   Glucose-Capillary 185 (H) 70 - 99 mg/dL    Comment: Glucose reference range applies only to samples taken after fasting for at least 8 hours.  CBG monitoring, ED     Status: Abnormal   Collection Time: 11/30/20  8:07 AM  Result Value Ref Range    Glucose-Capillary 186 (H) 70 - 99 mg/dL    Comment: Glucose reference range applies only to samples taken after fasting for at least 8 hours.  CBG monitoring, ED     Status: Abnormal   Collection Time: 11/30/20 11:41 AM  Result Value Ref Range   Glucose-Capillary 263 (H) 70 - 99 mg/dL    Comment: Glucose reference range applies only to samples taken after fasting for at least 8 hours.  Urinalysis, Complete w Microscopic Urine, Catheterized     Status: Abnormal   Collection Time: 12/19/20  2:08 PM  Result Value Ref Range   Color, Urine AMBER (A) YELLOW    Comment: BIOCHEMICALS MAY BE AFFECTED BY COLOR   APPearance CLOUDY (A) CLEAR   Specific Gravity, Urine 1.020 1.005 - 1.030   pH 5.0 5.0 - 8.0   Glucose, UA 50 (A) NEGATIVE mg/dL   Hgb urine dipstick NEGATIVE NEGATIVE   Bilirubin Urine NEGATIVE NEGATIVE   Ketones, ur 5 (A) NEGATIVE mg/dL   Protein, ur 213 (A) NEGATIVE mg/dL   Nitrite NEGATIVE NEGATIVE   Leukocytes,Ua NEGATIVE NEGATIVE   RBC / HPF  0-5 0 - 5 RBC/hpf   WBC, UA 11-20 0 - 5 WBC/hpf   Bacteria, UA RARE (A) NONE SEEN   Squamous Epithelial / LPF 0-5 0 - 5   Mucus PRESENT    Hyaline Casts, UA PRESENT     Comment: Performed at [facility], [address], [place], [place] [address]  Resp Panel by RT-PCR (Flu A&B, Covid) Nasopharyngeal Swab     Status: Abnormal   Collection Time: 12/19/20  2:57 PM   Specimen: Nasopharyngeal Swab; Nasopharyngeal(NP) swabs in vial transport medium  Result Value Ref Range   SARS Coronavirus 2 by RT PCR POSITIVE (A) NEGATIVE    Comment: RESULT CALLED TO, READ BACK BY AND VERIFIED WITH: laura stanfield  on 12/19/20 skl (NOTE) SARS-CoV-2 target nucleic acids are DETECTED.  The SARS-CoV-2 RNA is generally detectable in upper respiratory specimens during the acute phase of infection. Positive results are indicative of the presence of the identified virus, but do not rule out bacterial infection or co-infection with other  pathogens not detected by the test. Clinical correlation with patient history and other diagnostic information is necessary to determine patient infection status. The expected result is Negative.  Fact Sheet for Patients: BloggerCourse.com  Fact Sheet for Healthcare Providers: SeriousBroker.it  This test is not yet approved or cleared by the [place] FDA and  has been authorized for detection and/or diagnosis of SARS-CoV-2 by FDA under an Emergency Use Authorization (EUA).  This EUA will remain in effect (meaning this test can  be used) for the duration of  the COVID-19 declaration under Section 564(b)(1) of the Act, 21 U.S.C. section 360bbb-3(b)(1), unless the authorization is terminated or revoked sooner.     Influenza A by PCR NEGATIVE NEGATIVE   Influenza B by PCR NEGATIVE NEGATIVE    Comment: (NOTE) The Xpert Xpress SARS-CoV-2/FLU/RSV plus assay is intended as an aid in the diagnosis of influenza from Nasopharyngeal swab specimens and should not be used as a sole basis for treatment. Nasal washings and aspirates are unacceptable for Xpert Xpress SARS-CoV-2/FLU/RSV testing.  Fact Sheet for Patients: BloggerCourse.com  Fact Sheet for Healthcare Providers: SeriousBroker.it  This test is not yet approved or cleared by the [place] FDA and has been authorized for detection and/or diagnosis of SARS-CoV-2 by FDA under an Emergency Use Authorization (EUA). This EUA will remain in effect (meaning this test can be used) for the duration of the COVID-19 declaration under Section 564(b)(1) of the Act, 21 U.S.C. section 360bbb-3(b)(1), unless the authorization is terminated or revoked.  Performed at [facility], [address] [place], [place] [address]   CBC with Differential     Status: Abnormal   Collection Time: 12/19/20  4:15 PM  Result Value Ref Range    WBC 9.8 4.0 - 10.5 K/uL   RBC 4.37 3.87 - 5.11 MIL/uL   Hemoglobin 12.8 12.0 - 15.0 g/dL   HCT 09.8 11.9 - 14.7 %   MCV 88.1 80.0 - 100.0 fL   MCH 29.3 26.0 - 34.0 pg   MCHC 33.2 30.0 - 36.0 g/dL   RDW 82.9 56.2 - 13.0 %   Platelets 197 150 - 400 K/uL   nRBC 0.0 0.0 - 0.2 %   Neutrophils Relative % 81 %   Neutro Abs 7.9 (H) 1.7 - 7.7 K/uL   Lymphocytes Relative 11 %   Lymphs Abs 1.0 0.7 - 4.0 K/uL   Monocytes Relative 8 %   Monocytes Absolute 0.8 0.1 - 1.0  K/uL   Eosinophils Relative 0 %   Eosinophils Absolute 0.0 0.0 - 0.5 K/uL   Basophils Relative 0 %   Basophils Absolute 0.0 0.0 - 0.1 K/uL   Immature Granulocytes 0 %   Abs Immature Granulocytes 0.03 0.00 - 0.07 K/uL    Comment: Performed at [facility], [address], [place], [place] [address]  Comprehensive metabolic panel     Status: Abnormal   Collection Time: 12/19/20  4:15 PM  Result Value Ref Range   Sodium 134 (L) 135 - 145 mmol/L   Potassium 4.6 3.5 - 5.1 mmol/L   Chloride 100 98 - 111 mmol/L   CO2 22 22 - 32 mmol/L   Glucose, Bld 117 (H) 70 - 99 mg/dL    Comment: Glucose reference range applies only to samples taken after fasting for at least 8 hours.   BUN 24 (H) 8 - 23 mg/dL   Creatinine, Ser 6.04 (H) 0.44 - 1.00 mg/dL   Calcium 8.7 (L) 8.9 - 10.3 mg/dL   Total Protein 6.8 6.5 - 8.1 g/dL   Albumin 3.8 3.5 - 5.0 g/dL   AST 45 (H) 15 - 41 U/L   ALT 31 0 - 44 U/L   Alkaline Phosphatase 54 38 - 126 U/L   Total Bilirubin 0.7 0.3 - 1.2 mg/dL   GFR, Estimated 28 (L) >60 mL/min    Comment: (NOTE) Calculated using the CKD-EPI Creatinine Equation (2021)    Anion gap 12 5 - 15    Comment: Performed at [facility], [address], [place], [place] [address]  Troponin I (High Sensitivity)     Status: None   Collection Time: 12/19/20  4:15 PM  Result Value Ref Range   Troponin I (High Sensitivity) 10 <18 ng/L    Comment: (NOTE) Elevated high sensitivity troponin I (hsTnI) values and  significant  changes across serial measurements may suggest ACS but many other  chronic and acute conditions are known to elevate hsTnI results.  Refer to the "Links" section for chest pain algorithms and additional  guidance. Performed at [facility], [address] [place], [place] [address]      Assessment: 87 y.o. female with mild/moderate COVID 19 viral infection diagnosed on 2/19 at high risk for progression to severe COVID 19.  Plan:  This patient is a 87 y.o. female that meets the following criteria for Emergency Use Authorization of: Molnupiravir  1. Age >18 yr 2. SARS-COV-2 positive test 3. Symptom onset < 5 days 4. Mild-to-moderate COVID disease with high risk for severe progression to hospitalization or death   I have spoken and communicated the following to the patient or parent/caregiver regarding: 1. Molnupiravir is an unapproved drug that is authorized for use under an TEFL teacher.  2. There are no adequate, approved, available products for the treatment of COVID-19 in adults who have mild-to-moderate COVID-19 and are at high risk for progressing to severe COVID-19, including hospitalization or death. 3. Other therapeutics are currently authorized. For additional information on all products authorized for treatment or prevention of COVID-19, please see https://www.graham-miller.com/.  4. There are benefits and risks of taking this treatment as outlined in the "Fact Sheet for Patients and Caregivers."  5. "Fact Sheet for Patients and Caregivers" was reviewed with patient. A hard copy will be provided to patient from pharmacy prior to the patient receiving treatment. 6. Patients should continue to self-isolate and use infection control measures (e.g., wear mask, isolate, social distance, avoid  sharing personal items, clean and disinfect "high touch"  surfaces, and frequent handwashing) according to CDC guidelines.  7. The patient or parent/caregiver has the option to accept or refuse treatment. 8. [employer] has established a pregnancy surveillance program. 9. Females of childbearing potential should use a reliable method of contraception correctly and consistently, as applicable, for the duration of treatment and for 4 days after the last dose of Molnupiravir. 10. Males of reproductive potential who are sexually active with females of childbearing potential should use a reliable method of contraception correctly and consistently during treatment and for at least 3 months after the last dose. 11. Pregnancy status and risk was assessed. Patient verbalized understanding of precautions.   After reviewing above information with the patient, the patient agrees to receive molnupiravir. Pt declines mab at this time, although I have asked her to strongly consider it. SHe has AKI with a GFR of 28 and not a candidate for Paxlvoid. I have given her daughter out hotline number to call if she changes mind out monoclonal antibody infusion. Last day she would be a candidate is 2/28  Follow up instructions:    . Take prescription BID x 5 days as directed . Reach out to pharmacist for counseling on medication if desired . For concerns regarding further COVID symptoms please follow up with your PCP or urgent care . For urgent or life-threatening issues, seek care at your local emergency department  The patient was provided an opportunity to ask questions, and all were answered. The patient agreed with the plan and demonstrated an understanding of the instructions.   Script sent to [facility] and opted to pick up RX.  The patient was advised to call their PCP or seek an in-person evaluation if the symptoms worsen or if the condition fails to improve as anticipated.   I provided 15 minutes of non face-to-face telephone visit time during  this encounter, and > 50% was spent counseling as documented under my assessment & plan.  Cline Crock, PA-C 12/20/2020 /8:50 AM

## 2020-12-21 ENCOUNTER — Other Ambulatory Visit: Payer: Medicare Other

## 2020-12-21 ENCOUNTER — Ambulatory Visit: Payer: Medicare Other | Admitting: Internal Medicine

## 2020-12-21 ENCOUNTER — Ambulatory Visit: Payer: Medicare Other

## 2020-12-21 DIAGNOSIS — E1142 Type 2 diabetes mellitus with diabetic polyneuropathy: Secondary | ICD-10-CM | POA: Diagnosis not present

## 2020-12-21 DIAGNOSIS — I5032 Chronic diastolic (congestive) heart failure: Secondary | ICD-10-CM | POA: Diagnosis not present

## 2020-12-21 DIAGNOSIS — E785 Hyperlipidemia, unspecified: Secondary | ICD-10-CM | POA: Diagnosis not present

## 2020-12-21 DIAGNOSIS — I350 Nonrheumatic aortic (valve) stenosis: Secondary | ICD-10-CM | POA: Diagnosis not present

## 2020-12-21 DIAGNOSIS — I252 Old myocardial infarction: Secondary | ICD-10-CM | POA: Diagnosis not present

## 2020-12-21 DIAGNOSIS — I110 Hypertensive heart disease with heart failure: Secondary | ICD-10-CM | POA: Diagnosis not present

## 2020-12-21 DIAGNOSIS — E7800 Pure hypercholesterolemia, unspecified: Secondary | ICD-10-CM | POA: Diagnosis not present

## 2020-12-21 DIAGNOSIS — D72829 Elevated white blood cell count, unspecified: Secondary | ICD-10-CM | POA: Diagnosis not present

## 2020-12-21 DIAGNOSIS — D509 Iron deficiency anemia, unspecified: Secondary | ICD-10-CM | POA: Diagnosis not present

## 2020-12-21 DIAGNOSIS — J441 Chronic obstructive pulmonary disease with (acute) exacerbation: Secondary | ICD-10-CM | POA: Diagnosis not present

## 2020-12-21 DIAGNOSIS — I493 Ventricular premature depolarization: Secondary | ICD-10-CM | POA: Diagnosis not present

## 2020-12-21 DIAGNOSIS — E1169 Type 2 diabetes mellitus with other specified complication: Secondary | ICD-10-CM | POA: Diagnosis not present

## 2020-12-21 DIAGNOSIS — I2510 Atherosclerotic heart disease of native coronary artery without angina pectoris: Secondary | ICD-10-CM | POA: Diagnosis not present

## 2020-12-22 DIAGNOSIS — I5032 Chronic diastolic (congestive) heart failure: Secondary | ICD-10-CM | POA: Diagnosis not present

## 2020-12-22 DIAGNOSIS — E1142 Type 2 diabetes mellitus with diabetic polyneuropathy: Secondary | ICD-10-CM | POA: Diagnosis not present

## 2020-12-22 DIAGNOSIS — I2510 Atherosclerotic heart disease of native coronary artery without angina pectoris: Secondary | ICD-10-CM | POA: Diagnosis not present

## 2020-12-22 DIAGNOSIS — I350 Nonrheumatic aortic (valve) stenosis: Secondary | ICD-10-CM | POA: Diagnosis not present

## 2020-12-22 DIAGNOSIS — I493 Ventricular premature depolarization: Secondary | ICD-10-CM | POA: Diagnosis not present

## 2020-12-22 DIAGNOSIS — D509 Iron deficiency anemia, unspecified: Secondary | ICD-10-CM | POA: Diagnosis not present

## 2020-12-22 DIAGNOSIS — J441 Chronic obstructive pulmonary disease with (acute) exacerbation: Secondary | ICD-10-CM | POA: Diagnosis not present

## 2020-12-22 DIAGNOSIS — E1169 Type 2 diabetes mellitus with other specified complication: Secondary | ICD-10-CM | POA: Diagnosis not present

## 2020-12-22 DIAGNOSIS — D72829 Elevated white blood cell count, unspecified: Secondary | ICD-10-CM | POA: Diagnosis not present

## 2020-12-22 DIAGNOSIS — E785 Hyperlipidemia, unspecified: Secondary | ICD-10-CM | POA: Diagnosis not present

## 2020-12-22 DIAGNOSIS — I252 Old myocardial infarction: Secondary | ICD-10-CM | POA: Diagnosis not present

## 2020-12-22 DIAGNOSIS — I110 Hypertensive heart disease with heart failure: Secondary | ICD-10-CM | POA: Diagnosis not present

## 2020-12-22 DIAGNOSIS — E7800 Pure hypercholesterolemia, unspecified: Secondary | ICD-10-CM | POA: Diagnosis not present

## 2020-12-23 DIAGNOSIS — I5032 Chronic diastolic (congestive) heart failure: Secondary | ICD-10-CM | POA: Diagnosis not present

## 2020-12-23 DIAGNOSIS — I110 Hypertensive heart disease with heart failure: Secondary | ICD-10-CM | POA: Diagnosis not present

## 2020-12-23 DIAGNOSIS — E7800 Pure hypercholesterolemia, unspecified: Secondary | ICD-10-CM | POA: Diagnosis not present

## 2020-12-23 DIAGNOSIS — I252 Old myocardial infarction: Secondary | ICD-10-CM | POA: Diagnosis not present

## 2020-12-23 DIAGNOSIS — D72829 Elevated white blood cell count, unspecified: Secondary | ICD-10-CM | POA: Diagnosis not present

## 2020-12-23 DIAGNOSIS — E1142 Type 2 diabetes mellitus with diabetic polyneuropathy: Secondary | ICD-10-CM | POA: Diagnosis not present

## 2020-12-23 DIAGNOSIS — J441 Chronic obstructive pulmonary disease with (acute) exacerbation: Secondary | ICD-10-CM | POA: Diagnosis not present

## 2020-12-23 DIAGNOSIS — I493 Ventricular premature depolarization: Secondary | ICD-10-CM | POA: Diagnosis not present

## 2020-12-23 DIAGNOSIS — D509 Iron deficiency anemia, unspecified: Secondary | ICD-10-CM | POA: Diagnosis not present

## 2020-12-23 DIAGNOSIS — I2510 Atherosclerotic heart disease of native coronary artery without angina pectoris: Secondary | ICD-10-CM | POA: Diagnosis not present

## 2020-12-23 DIAGNOSIS — E785 Hyperlipidemia, unspecified: Secondary | ICD-10-CM | POA: Diagnosis not present

## 2020-12-23 DIAGNOSIS — I350 Nonrheumatic aortic (valve) stenosis: Secondary | ICD-10-CM | POA: Diagnosis not present

## 2020-12-23 DIAGNOSIS — E1169 Type 2 diabetes mellitus with other specified complication: Secondary | ICD-10-CM | POA: Diagnosis not present

## 2020-12-24 ENCOUNTER — Ambulatory Visit: Payer: Medicare Other

## 2020-12-29 ENCOUNTER — Ambulatory Visit: Payer: Medicare Other | Admitting: Internal Medicine

## 2020-12-29 DIAGNOSIS — D509 Iron deficiency anemia, unspecified: Secondary | ICD-10-CM | POA: Diagnosis not present

## 2020-12-29 DIAGNOSIS — M25512 Pain in left shoulder: Secondary | ICD-10-CM

## 2020-12-29 DIAGNOSIS — M353 Polymyalgia rheumatica: Secondary | ICD-10-CM

## 2020-12-29 DIAGNOSIS — E1142 Type 2 diabetes mellitus with diabetic polyneuropathy: Secondary | ICD-10-CM | POA: Diagnosis not present

## 2020-12-29 DIAGNOSIS — Z7984 Long term (current) use of oral hypoglycemic drugs: Secondary | ICD-10-CM

## 2020-12-29 DIAGNOSIS — I2510 Atherosclerotic heart disease of native coronary artery without angina pectoris: Secondary | ICD-10-CM | POA: Diagnosis not present

## 2020-12-29 DIAGNOSIS — D72829 Elevated white blood cell count, unspecified: Secondary | ICD-10-CM

## 2020-12-29 DIAGNOSIS — E785 Hyperlipidemia, unspecified: Secondary | ICD-10-CM | POA: Diagnosis not present

## 2020-12-29 DIAGNOSIS — Z7982 Long term (current) use of aspirin: Secondary | ICD-10-CM

## 2020-12-29 DIAGNOSIS — R0781 Pleurodynia: Secondary | ICD-10-CM

## 2020-12-29 DIAGNOSIS — Z79899 Other long term (current) drug therapy: Secondary | ICD-10-CM

## 2020-12-29 DIAGNOSIS — Z7951 Long term (current) use of inhaled steroids: Secondary | ICD-10-CM

## 2020-12-29 DIAGNOSIS — J441 Chronic obstructive pulmonary disease with (acute) exacerbation: Secondary | ICD-10-CM | POA: Diagnosis not present

## 2020-12-29 DIAGNOSIS — R413 Other amnesia: Secondary | ICD-10-CM

## 2020-12-29 DIAGNOSIS — I5032 Chronic diastolic (congestive) heart failure: Secondary | ICD-10-CM | POA: Diagnosis not present

## 2020-12-29 DIAGNOSIS — E7800 Pure hypercholesterolemia, unspecified: Secondary | ICD-10-CM | POA: Diagnosis not present

## 2020-12-29 DIAGNOSIS — I350 Nonrheumatic aortic (valve) stenosis: Secondary | ICD-10-CM | POA: Diagnosis not present

## 2020-12-29 DIAGNOSIS — M549 Dorsalgia, unspecified: Secondary | ICD-10-CM

## 2020-12-29 DIAGNOSIS — E1169 Type 2 diabetes mellitus with other specified complication: Secondary | ICD-10-CM | POA: Diagnosis not present

## 2020-12-29 DIAGNOSIS — K5792 Diverticulitis of intestine, part unspecified, without perforation or abscess without bleeding: Secondary | ICD-10-CM

## 2020-12-29 DIAGNOSIS — I110 Hypertensive heart disease with heart failure: Secondary | ICD-10-CM | POA: Diagnosis not present

## 2020-12-29 DIAGNOSIS — M810 Age-related osteoporosis without current pathological fracture: Secondary | ICD-10-CM

## 2020-12-29 DIAGNOSIS — I252 Old myocardial infarction: Secondary | ICD-10-CM | POA: Diagnosis not present

## 2020-12-29 DIAGNOSIS — R5383 Other fatigue: Secondary | ICD-10-CM

## 2020-12-29 DIAGNOSIS — I493 Ventricular premature depolarization: Secondary | ICD-10-CM | POA: Diagnosis not present

## 2020-12-30 ENCOUNTER — Inpatient Hospital Stay: Payer: Medicare Other | Attending: Internal Medicine

## 2020-12-30 ENCOUNTER — Inpatient Hospital Stay: Payer: Medicare Other

## 2020-12-30 ENCOUNTER — Inpatient Hospital Stay: Payer: Medicare Other | Admitting: Internal Medicine

## 2020-12-30 DIAGNOSIS — D509 Iron deficiency anemia, unspecified: Secondary | ICD-10-CM | POA: Diagnosis not present

## 2020-12-30 DIAGNOSIS — I252 Old myocardial infarction: Secondary | ICD-10-CM | POA: Diagnosis not present

## 2020-12-30 DIAGNOSIS — D72829 Elevated white blood cell count, unspecified: Secondary | ICD-10-CM | POA: Diagnosis not present

## 2020-12-30 DIAGNOSIS — E1142 Type 2 diabetes mellitus with diabetic polyneuropathy: Secondary | ICD-10-CM | POA: Diagnosis not present

## 2020-12-30 DIAGNOSIS — E785 Hyperlipidemia, unspecified: Secondary | ICD-10-CM | POA: Diagnosis not present

## 2020-12-30 DIAGNOSIS — I493 Ventricular premature depolarization: Secondary | ICD-10-CM | POA: Diagnosis not present

## 2020-12-30 DIAGNOSIS — I110 Hypertensive heart disease with heart failure: Secondary | ICD-10-CM | POA: Diagnosis not present

## 2020-12-30 DIAGNOSIS — E1169 Type 2 diabetes mellitus with other specified complication: Secondary | ICD-10-CM | POA: Diagnosis not present

## 2020-12-30 DIAGNOSIS — J441 Chronic obstructive pulmonary disease with (acute) exacerbation: Secondary | ICD-10-CM | POA: Diagnosis not present

## 2020-12-30 DIAGNOSIS — E7800 Pure hypercholesterolemia, unspecified: Secondary | ICD-10-CM | POA: Diagnosis not present

## 2020-12-30 DIAGNOSIS — I350 Nonrheumatic aortic (valve) stenosis: Secondary | ICD-10-CM | POA: Diagnosis not present

## 2020-12-30 DIAGNOSIS — I2510 Atherosclerotic heart disease of native coronary artery without angina pectoris: Secondary | ICD-10-CM | POA: Diagnosis not present

## 2020-12-30 DIAGNOSIS — I5032 Chronic diastolic (congestive) heart failure: Secondary | ICD-10-CM | POA: Diagnosis not present

## 2020-12-30 NOTE — Assessment & Plan Note (Deleted)
#  Iron deficiency anemia-hemoglobin 8-9; on p.o. iron.  Hemoglobin 13.  Improving however continues to be symptomatic.  Recommend IV iron infusion.  # Etiology: Iron deficiency unclear; s/p GI evaluation; declined colonoscopy [intol to prep].  Again had a detailed discussion with the patient and friend regarding the importance of identifying the etiology as this could be a malignant process.  As an alternative to colonoscopy would recommend-CT scan abdomen pelvis for further evaluation.  Understands that CT scan would be suboptimal for smaller lesions/leaky blood vessels etc.  However patient is quite adamant not wanting to have a colonoscopy.  # DISPOSITION:will cal with results # Venofer today # CT A/P ASAP # follow up in 4 months; MD; labs- cbc/bmp;Venofer; Dr.B

## 2021-01-01 DIAGNOSIS — J441 Chronic obstructive pulmonary disease with (acute) exacerbation: Secondary | ICD-10-CM | POA: Diagnosis not present

## 2021-01-01 DIAGNOSIS — E7800 Pure hypercholesterolemia, unspecified: Secondary | ICD-10-CM | POA: Diagnosis not present

## 2021-01-01 DIAGNOSIS — I110 Hypertensive heart disease with heart failure: Secondary | ICD-10-CM | POA: Diagnosis not present

## 2021-01-01 DIAGNOSIS — D72829 Elevated white blood cell count, unspecified: Secondary | ICD-10-CM | POA: Diagnosis not present

## 2021-01-01 DIAGNOSIS — I350 Nonrheumatic aortic (valve) stenosis: Secondary | ICD-10-CM | POA: Diagnosis not present

## 2021-01-01 DIAGNOSIS — I5032 Chronic diastolic (congestive) heart failure: Secondary | ICD-10-CM | POA: Diagnosis not present

## 2021-01-01 DIAGNOSIS — I2510 Atherosclerotic heart disease of native coronary artery without angina pectoris: Secondary | ICD-10-CM | POA: Diagnosis not present

## 2021-01-01 DIAGNOSIS — I252 Old myocardial infarction: Secondary | ICD-10-CM | POA: Diagnosis not present

## 2021-01-01 DIAGNOSIS — E785 Hyperlipidemia, unspecified: Secondary | ICD-10-CM | POA: Diagnosis not present

## 2021-01-01 DIAGNOSIS — I493 Ventricular premature depolarization: Secondary | ICD-10-CM | POA: Diagnosis not present

## 2021-01-01 DIAGNOSIS — E1142 Type 2 diabetes mellitus with diabetic polyneuropathy: Secondary | ICD-10-CM | POA: Diagnosis not present

## 2021-01-01 DIAGNOSIS — D509 Iron deficiency anemia, unspecified: Secondary | ICD-10-CM | POA: Diagnosis not present

## 2021-01-01 DIAGNOSIS — E1169 Type 2 diabetes mellitus with other specified complication: Secondary | ICD-10-CM | POA: Diagnosis not present

## 2021-01-05 DIAGNOSIS — D72829 Elevated white blood cell count, unspecified: Secondary | ICD-10-CM | POA: Diagnosis not present

## 2021-01-05 DIAGNOSIS — E7800 Pure hypercholesterolemia, unspecified: Secondary | ICD-10-CM | POA: Diagnosis not present

## 2021-01-05 DIAGNOSIS — I252 Old myocardial infarction: Secondary | ICD-10-CM | POA: Diagnosis not present

## 2021-01-05 DIAGNOSIS — E1142 Type 2 diabetes mellitus with diabetic polyneuropathy: Secondary | ICD-10-CM | POA: Diagnosis not present

## 2021-01-05 DIAGNOSIS — I110 Hypertensive heart disease with heart failure: Secondary | ICD-10-CM | POA: Diagnosis not present

## 2021-01-05 DIAGNOSIS — D509 Iron deficiency anemia, unspecified: Secondary | ICD-10-CM | POA: Diagnosis not present

## 2021-01-05 DIAGNOSIS — E1169 Type 2 diabetes mellitus with other specified complication: Secondary | ICD-10-CM | POA: Diagnosis not present

## 2021-01-05 DIAGNOSIS — I350 Nonrheumatic aortic (valve) stenosis: Secondary | ICD-10-CM | POA: Diagnosis not present

## 2021-01-05 DIAGNOSIS — E785 Hyperlipidemia, unspecified: Secondary | ICD-10-CM | POA: Diagnosis not present

## 2021-01-05 DIAGNOSIS — I2510 Atherosclerotic heart disease of native coronary artery without angina pectoris: Secondary | ICD-10-CM | POA: Diagnosis not present

## 2021-01-05 DIAGNOSIS — I493 Ventricular premature depolarization: Secondary | ICD-10-CM | POA: Diagnosis not present

## 2021-01-05 DIAGNOSIS — I5032 Chronic diastolic (congestive) heart failure: Secondary | ICD-10-CM | POA: Diagnosis not present

## 2021-01-05 DIAGNOSIS — J441 Chronic obstructive pulmonary disease with (acute) exacerbation: Secondary | ICD-10-CM | POA: Diagnosis not present

## 2021-01-11 ENCOUNTER — Ambulatory Visit: Payer: Medicare Other

## 2021-01-11 ENCOUNTER — Emergency Department
Admission: EM | Admit: 2021-01-11 | Discharge: 2021-01-11 | Disposition: A | Discharge status: Home / Self Care | Payer: Medicare Other | Attending: Emergency Medicine | Admitting: Emergency Medicine

## 2021-01-11 ENCOUNTER — Emergency Department: Payer: Medicare Other

## 2021-01-11 ENCOUNTER — Other Ambulatory Visit: Payer: Self-pay

## 2021-01-11 DIAGNOSIS — Z955 Presence of coronary angioplasty implant and graft: Secondary | ICD-10-CM | POA: Insufficient documentation

## 2021-01-11 DIAGNOSIS — I110 Hypertensive heart disease with heart failure: Secondary | ICD-10-CM | POA: Diagnosis not present

## 2021-01-11 DIAGNOSIS — R0602 Shortness of breath: Secondary | ICD-10-CM | POA: Diagnosis not present

## 2021-01-11 DIAGNOSIS — E119 Type 2 diabetes mellitus without complications: Secondary | ICD-10-CM | POA: Insufficient documentation

## 2021-01-11 DIAGNOSIS — Z20822 Contact with and (suspected) exposure to covid-19: Secondary | ICD-10-CM | POA: Diagnosis not present

## 2021-01-11 DIAGNOSIS — J441 Chronic obstructive pulmonary disease with (acute) exacerbation: Secondary | ICD-10-CM | POA: Diagnosis not present

## 2021-01-11 DIAGNOSIS — Z79899 Other long term (current) drug therapy: Secondary | ICD-10-CM | POA: Insufficient documentation

## 2021-01-11 DIAGNOSIS — Z7984 Long term (current) use of oral hypoglycemic drugs: Secondary | ICD-10-CM | POA: Insufficient documentation

## 2021-01-11 DIAGNOSIS — I5033 Acute on chronic diastolic (congestive) heart failure: Secondary | ICD-10-CM | POA: Diagnosis not present

## 2021-01-11 DIAGNOSIS — I2510 Atherosclerotic heart disease of native coronary artery without angina pectoris: Secondary | ICD-10-CM | POA: Insufficient documentation

## 2021-01-11 DIAGNOSIS — U071 COVID-19: Secondary | ICD-10-CM | POA: Diagnosis not present

## 2021-01-11 DIAGNOSIS — R0600 Dyspnea, unspecified: Secondary | ICD-10-CM | POA: Diagnosis not present

## 2021-01-11 LAB — CBC
HCT: 41.4 % (ref 36.0–46.0)
Hemoglobin: 13.5 g/dL (ref 12.0–15.0)
MCH: 28.4 pg (ref 26.0–34.0)
MCHC: 32.6 g/dL (ref 30.0–36.0)
MCV: 87.2 fL (ref 80.0–100.0)
Platelets: 286 10*3/uL (ref 150–400)
RBC: 4.75 MIL/uL (ref 3.87–5.11)
RDW: 13.3 % (ref 11.5–15.5)
WBC: 11.1 10*3/uL — ABNORMAL HIGH (ref 4.0–10.5)
nRBC: 0 % (ref 0.0–0.2)

## 2021-01-11 LAB — RESP PANEL BY RT-PCR (FLU A&B, COVID) ARPGX2
Influenza A by PCR: NEGATIVE
Influenza B by PCR: NEGATIVE
SARS Coronavirus 2 by RT PCR: POSITIVE — AB

## 2021-01-11 LAB — TROPONIN I (HIGH SENSITIVITY): Troponin I (High Sensitivity): 7 ng/L (ref <18)

## 2021-01-11 LAB — BASIC METABOLIC PANEL
Anion gap: 9 (ref 5–15)
BUN: 20 mg/dL (ref 8–23)
CO2: 25 mmol/L (ref 22–32)
Calcium: 9.5 mg/dL (ref 8.9–10.3)
Chloride: 107 mmol/L (ref 98–111)
Creatinine, Ser: 0.67 mg/dL (ref 0.44–1.00)
GFR, Estimated: >60 mL/min (ref >60)
Glucose, Bld: 149 mg/dL — ABNORMAL HIGH (ref 70–99)
Potassium: 3.9 mmol/L (ref 3.5–5.1)
Sodium: 141 mmol/L (ref 135–145)

## 2021-01-11 MED ORDER — IPRATROPIUM-ALBUTEROL 0.5-2.5 (3) MG/3ML IN SOLN
3.0000 mL | Freq: Once | RESPIRATORY_TRACT | Status: AC
  Administered 2021-01-11: 3 mL via RESPIRATORY_TRACT
  Filled 2021-01-11: qty 3

## 2021-01-11 MED ORDER — PREDNISONE 10 MG PO TABS: 10.0000 mg | ORAL_TABLET | Freq: Every day | ORAL | 0 refills | Status: DC

## 2021-01-11 MED ORDER — METHYLPREDNISOLONE SODIUM SUCC 125 MG IJ SOLR
125.0000 mg | Freq: Once | INTRAMUSCULAR | Status: AC
  Administered 2021-01-11: 125 mg via INTRAVENOUS
  Filled 2021-01-11: qty 2

## 2021-01-11 NOTE — ED Provider Notes (Signed)
[facility] Emergency Department Provider Note  Time seen: 9:26 AM  I have reviewed the triage vital signs and the nursing notes.   HISTORY  Chief Complaint Shortness of Breath   HPI [place] Vangorp is a 87 y.o. female with a past medical history of asthma, CAD, CHF, diabetes, hypertension, hyperlipidemia, prior MI, COPD, presents to the emergency department for shortness of breath.  According to the patient since last night she has been feeling short of breath.  Denies any fever cough congestion.  Denies any chest pain.  Patient attempted to use her breathing treatments at home without success.  Does not wear oxygen at baseline.   Past Medical History:  Diagnosis Date  . Aortic insufficiency    a. TTE 12/19: EF 55-60%, probable HK of the mid apical anterior septal myocardium, Gr1DD, mild AI, mildly dilated LA  . Asthma   . CAD (coronary artery disease)    a. NSTEMI 12/19; b. LHC 10/08/18: LM minimal luminal irregs, mLAD-1 95% s/p PCI/DES, mLAD-2 60%, LCx mild diffuse disease throughout, RCA minimal luminal irregs; b. 03/2020 MV: EF>65%, no ischemia/scar.  . CHF (congestive heart failure) (HCC)   . Diabetes mellitus (HCC)   . Hypercholesterolemia   . Hypertension   . Myocardial infarction (HCC)   . Osteopenia   . Palpitations    a. 04/2020 Zio: Avg HR 75. 429 SVT episodes, longest 19 secs @ 133. Occas PACs (3.2%). Rare PVCs (<1%).  . Polymyalgia rheumatica syndrome (HCC)   . Reactive airway disease     Patient Active Problem List   Diagnosis Date Noted  . COPD exacerbation (HCC) 11/29/2020  . Chronic diastolic CHF (congestive heart failure) (HCC) 11/29/2020  . CAP (community acquired pneumonia) 11/29/2020  . Rib pain on right side 10/19/2020  . Abnormal CXR 10/19/2020  . Type 2 diabetes mellitus without complication (HCC) 10/08/2020  . Elevated troponin 10/08/2020  . Acute on chronic heart failure with preserved ejection fraction (HFpEF) (HCC)  10/08/2020  . COPD with acute exacerbation (HCC) 08/19/2020  . History of non-ST elevation myocardial infarction (NSTEMI) 08/07/2020  . Asthma 08/07/2020  . Leukocytosis 08/07/2020  . Left shoulder pain 07/12/2020  . Iron deficiency 04/16/2020  . Anemia 04/04/2020  . SOB (shortness of breath) 03/25/2020  . Cough 09/30/2019  . Gallstone pancreatitis   . Pre-op evaluation 08/30/2019  . Cholecystitis 08/05/2019  . Choledocholithiasis   . Respiratory illness 06/18/2019  . Coronary artery disease 12/29/2018  . Chest pain 10/07/2018  . Memory change 05/27/2018  . Osteoporosis 02/05/2018  . Weight loss 06/24/2017  . Abdominal pain, left lower quadrant 10/05/2016  . Long term current use of systemic steroids 05/16/2016  . Elevated erythrocyte sedimentation rate 05/04/2016  . Back pain 04/29/2016  . Fatigue 05/24/2015  . Health care maintenance 01/25/2015  . UTI (urinary tract infection) 07/07/2014  . Neuropathy 03/24/2014  . Diverticulitis 02/24/2013  . Reactive airway disease 09/15/2012  . Hypertension 09/14/2012  . Hypercholesterolemia 09/14/2012  . Diabetes mellitus with cardiac complication (HCC) 09/14/2012    Past Surgical History:  Procedure Laterality Date  . ABDOMINAL HYSTERECTOMY  1981   prolapse and bleeding, ovaries not removed  . BREAST EXCISIONAL BIOPSY Right   . CHOLECYSTECTOMY N/A 09/02/2019   Procedure: LAPAROSCOPIC CHOLECYSTECTOMY WITH INTRAOPERATIVE CHOLANGIOGRAM;  Surgeon: Henrene Dodge, MD;  Location: [facility] ORS;  Service: General;  Laterality: N/A;  . CORONARY STENT INTERVENTION N/A 10/08/2018   Procedure: CORONARY STENT INTERVENTION;  Surgeon: Iran Ouch, MD;  Location:  [facility] INVASIVE CV LAB;  Service: Cardiovascular;  Laterality: N/A;  . ENDOSCOPIC RETROGRADE CHOLANGIOPANCREATOGRAPHY (ERCP) WITH PROPOFOL N/A 08/08/2019   Procedure: ENDOSCOPIC RETROGRADE CHOLANGIOPANCREATOGRAPHY (ERCP) WITH PROPOFOL;  Surgeon: Midge Minium, MD;  Location: [facility] ENDOSCOPY;   Service: Endoscopy;  Laterality: N/A;  . LEFT HEART CATH AND CORONARY ANGIOGRAPHY N/A 10/08/2018   Procedure: LEFT HEART CATH AND CORONARY ANGIOGRAPHY;  Surgeon: Iran Ouch, MD;  Location: [facility] INVASIVE CV LAB;  Service: Cardiovascular;  Laterality: N/A;  . LEFT HEART CATH AND CORONARY ANGIOGRAPHY N/A 08/10/2020   Procedure: LEFT HEART CATH AND CORONARY ANGIOGRAPHY possible percutaneous intervention;  Surgeon: Iran Ouch, MD;  Location: [facility] INVASIVE CV LAB;  Service: Cardiovascular;  Laterality: N/A;  . UMBILICAL HERNIA REPAIR  7/94    Prior to Admission medications   Medication Sig Start Date End Date Taking? Authorizing Provider  ADVAIR DISKUS 250-50 MCG/DOSE AEPB INHALE ONE PUFF BY MOUTH EVERY 12 HOURS. RINSE MOUTH AFTER EACH USE Patient taking differently: Inhale 1 puff into the lungs 2 (two) times daily. 02/17/16   Dale Granville, MD  albuterol (PROVENTIL) (2.5 MG/3ML) 0.083% nebulizer solution USE 1 VIAL IN NEBULIZER EVERY 4 HOURS AS NEEDED FOR WHEEZING OR SHORTNESS OF BREATH Patient taking differently: Take 2.5 mg by nebulization in the morning and at bedtime. 11/25/20   Dale [place], MD  alendronate (FOSAMAX) 70 MG tablet Take 70 mg by mouth once a week.  11/06/17   [provider]  aspirin 81 MG tablet Take 81 mg by mouth daily.    [provider]  atorvastatin (LIPITOR) 80 MG tablet Take 1 tablet (80 mg total) by mouth daily at 6 PM. 04/01/20   Alver Sorrow, NP  fluticasone (FLONASE) 50 MCG/ACT nasal spray Use 2 spray(s) in each nostril once daily Patient taking differently: Place 2 sprays into both nostrils daily. 08/03/20   Dale St. Martin, MD  isosorbide mononitrate (IMDUR) 30 MG 24 hr tablet Take 1 tablet (30 mg total) by mouth daily. 08/11/20   Arnetha Courser, MD  lisinopril (ZESTRIL) 20 MG tablet Take 1 tablet by mouth once daily 11/17/20   Dunn, Raymon Mutton, PA-C  metFORMIN (GLUCOPHAGE) 500 MG tablet TAKE 1 TABLET BY MOUTH TWICE DAILY WITH A  MEAL Patient taking differently: Take 500 mg by mouth 2 (two) times daily with a meal. 09/29/20   Dale Sedalia, MD  metoprolol succinate (TOPROL-XL) 50 MG 24 hr tablet Take 1 tablet (50 mg total) by mouth daily. Take with or immediately following a meal. 08/21/20   Rolly Salter, MD  metoprolol tartrate (LOPRESSOR) 25 MG tablet Take 1 tablet by mouth twice daily 12/21/20   Iran Ouch, MD  Nebulizers (COMPRESSOR/NEBULIZER) MISC 1 Units by Does not apply route as directed. 04/07/20   Dionne Bucy, MD  nitroGLYCERIN (NITROSTAT) 0.4 MG SL tablet Place 1 tablet (0.4 mg total) under the tongue every 5 (five) minutes as needed for chest pain. 10/09/18   Adrian Saran, MD  PROAIR HFA 108 (90 Base) MCG/ACT inhaler Inhale 2 puffs into the lungs every 4 (four) hours as needed for wheezing or shortness of breath. 08/20/20   Rolly Salter, MD  Vitamin D, Ergocalciferol, (DRISDOL) 50000 units CAPS capsule Take 50,000 Units by mouth once a week. 06/24/18   [provider]    Allergies  Allergen Reactions  . Tramadol Itching and Nausea And Vomiting    Family History  Problem Relation Age of Onset  . Heart attack Father   . Arthritis Mother   .  Heart disease Mother   . Throat cancer Sister   . Parkinson's disease Sister   . COPD Brother     Social History Social History   Tobacco Use  . Smoking status: Never Smoker  . Smokeless tobacco: Never Used  Vaping Use  . Vaping Use: Never used  Substance Use Topics  . Alcohol use: No    Alcohol/week: 0.0 standard drinks  . Drug use: No    Review of Systems Constitutional: Negative for fever. Cardiovascular: Negative for chest pain. Respiratory: Positive for shortness of breath. Gastrointestinal: Negative for abdominal pain Musculoskeletal: Negative for musculoskeletal complaints Neurological: Negative for headache All other ROS negative  ____________________________________________   PHYSICAL EXAM:  VITAL SIGNS: ED  Triage Vitals  Enc Vitals Group     BP 01/11/21 0857 (!) 149/122     Pulse Rate 01/11/21 0857 90     Resp 01/11/21 0857 17     Temp 01/11/21 0857 97.9 F (36.6 C)     Temp Source 01/11/21 0857 Oral     SpO2 01/11/21 0857 95 %     Weight 01/11/21 0857 118 lb (53.5 kg)     Height 01/11/21 0857 5\' 1"  (1.549 m)     Head Circumference --      Peak Flow --      Pain Score 01/11/21 0902 0     Pain Loc --      Pain Edu? --      Excl. in GC? --     Constitutional: Alert and oriented. Well appearing and in no distress. Eyes: Normal exam ENT      Head: Normocephalic and atraumatic.      Mouth/Throat: Mucous membranes are moist. Cardiovascular: Normal rate, regular rhythm. No murmur Respiratory: Mild tachypnea with mild expiratory wheezes bilaterally. Gastrointestinal: Soft and nontender. No distention. Musculoskeletal: Nontender with normal range of motion in all extremities.  No lower extremity edema. Neurologic:  Normal speech and language. No gross focal neurologic deficits  Skin:  Skin is warm, dry and intact.  Psychiatric: Mood and affect are normal.  ____________________________________________    EKG  EKG viewed and interpreted by myself shows a sinus rhythm at 93 bpm with a narrow QRS, normal axis, normal intervals, no concerning ST changes.  ____________________________________________    RADIOLOGY  X-ray is clear  ____________________________________________   INITIAL IMPRESSION / ASSESSMENT AND PLAN / ED COURSE  Pertinent labs & imaging results that were available during my care of the patient were reviewed by me and considered in my medical decision making (see chart for details).   Patient presents emergency department for shortness of breath.  Patient is hypertensive 122 diastolic, has expiratory wheezes bilaterally.  Differential would include COPD versus CHF, pulmonary edema, less likely ACS or pneumonia.  Record review does show the patient tested positive  for Covid approximately 1 month ago.  We will continue to closely monitor the patient.  We will check labs, chest x-ray, treat with duo nebs and Solu-Medrol and reassess.  Patient's lab work has resulted reassuring, negative troponin.  Patient continues to test positive for Covid, initially tested positive close to 1 month ago.  I discussed with the patient she states she is feeling much better, wishes to go home.  We will discharge patient home with prednisone taper and PCP follow-up.  I discussed with the patient very strict return precautions.  Patient agreeable to plan of care.  Daughter is here as well who is agreeable.  01/13/21 Suppa was  evaluated in Emergency Department on 01/11/2021 for the symptoms described in the history of present illness. She was evaluated in the context of the global COVID-19 pandemic, which necessitated consideration that the patient might be at risk for infection with the SARS-CoV-2 virus that causes COVID-19. Institutional protocols and algorithms that pertain to the evaluation of patients at risk for COVID-19 are in a state of rapid change based on information released by regulatory bodies including the CDC and federal and state organizations. These policies and algorithms were followed during the patient's care in the ED.  ____________________________________________   FINAL CLINICAL IMPRESSION(S) / ED DIAGNOSES  Dyspnea COPD exacerbation   [name], MD 01/11/21 1152

## 2021-01-11 NOTE — ED Notes (Signed)
Pt presents to the ED for SOB for the past couple of days. Pt states it was constant but has gotten worse over time. Denies any cough, CP, or NVD. Pt did test positive for COVID on 2/19 where she was d/c and sent home with antibiotics. Pt is unvaccinated. Pt does have a hx of COPD and CHF but is not on any O2 at home. Pt is A&Ox4 and NAD. Pt connected to cardiac at this time.

## 2021-01-11 NOTE — ED Notes (Signed)
Date and time results received: 01/11/21 1125  Test: COVID Critical Value: Positive  Name of Provider Notified: Dr. Lenard Lance, MD

## 2021-01-11 NOTE — ED Notes (Signed)
Pt transported to X-Ray at this time.  

## 2021-01-11 NOTE — ED Triage Notes (Signed)
Pt c/o increased SOB since last night, pt is in NAD on arrival, able to speak in complete sentences, states she has a hx of COPD.Tanya Cole

## 2021-01-12 DIAGNOSIS — I2510 Atherosclerotic heart disease of native coronary artery without angina pectoris: Secondary | ICD-10-CM | POA: Diagnosis not present

## 2021-01-12 DIAGNOSIS — I252 Old myocardial infarction: Secondary | ICD-10-CM | POA: Diagnosis not present

## 2021-01-12 DIAGNOSIS — J441 Chronic obstructive pulmonary disease with (acute) exacerbation: Secondary | ICD-10-CM | POA: Diagnosis not present

## 2021-01-12 DIAGNOSIS — I493 Ventricular premature depolarization: Secondary | ICD-10-CM | POA: Diagnosis not present

## 2021-01-12 DIAGNOSIS — E1169 Type 2 diabetes mellitus with other specified complication: Secondary | ICD-10-CM | POA: Diagnosis not present

## 2021-01-12 DIAGNOSIS — I5032 Chronic diastolic (congestive) heart failure: Secondary | ICD-10-CM | POA: Diagnosis not present

## 2021-01-12 DIAGNOSIS — E1142 Type 2 diabetes mellitus with diabetic polyneuropathy: Secondary | ICD-10-CM | POA: Diagnosis not present

## 2021-01-12 DIAGNOSIS — I350 Nonrheumatic aortic (valve) stenosis: Secondary | ICD-10-CM | POA: Diagnosis not present

## 2021-01-12 DIAGNOSIS — E7800 Pure hypercholesterolemia, unspecified: Secondary | ICD-10-CM | POA: Diagnosis not present

## 2021-01-12 DIAGNOSIS — D72829 Elevated white blood cell count, unspecified: Secondary | ICD-10-CM | POA: Diagnosis not present

## 2021-01-12 DIAGNOSIS — D509 Iron deficiency anemia, unspecified: Secondary | ICD-10-CM | POA: Diagnosis not present

## 2021-01-12 DIAGNOSIS — I110 Hypertensive heart disease with heart failure: Secondary | ICD-10-CM | POA: Diagnosis not present

## 2021-01-12 DIAGNOSIS — E785 Hyperlipidemia, unspecified: Secondary | ICD-10-CM | POA: Diagnosis not present

## 2021-01-19 DIAGNOSIS — E1169 Type 2 diabetes mellitus with other specified complication: Secondary | ICD-10-CM | POA: Diagnosis not present

## 2021-01-19 DIAGNOSIS — I252 Old myocardial infarction: Secondary | ICD-10-CM | POA: Diagnosis not present

## 2021-01-19 DIAGNOSIS — E1142 Type 2 diabetes mellitus with diabetic polyneuropathy: Secondary | ICD-10-CM | POA: Diagnosis not present

## 2021-01-19 DIAGNOSIS — I350 Nonrheumatic aortic (valve) stenosis: Secondary | ICD-10-CM | POA: Diagnosis not present

## 2021-01-19 DIAGNOSIS — E785 Hyperlipidemia, unspecified: Secondary | ICD-10-CM | POA: Diagnosis not present

## 2021-01-19 DIAGNOSIS — I2510 Atherosclerotic heart disease of native coronary artery without angina pectoris: Secondary | ICD-10-CM | POA: Diagnosis not present

## 2021-01-19 DIAGNOSIS — D509 Iron deficiency anemia, unspecified: Secondary | ICD-10-CM | POA: Diagnosis not present

## 2021-01-19 DIAGNOSIS — I5032 Chronic diastolic (congestive) heart failure: Secondary | ICD-10-CM | POA: Diagnosis not present

## 2021-01-19 DIAGNOSIS — D72829 Elevated white blood cell count, unspecified: Secondary | ICD-10-CM | POA: Diagnosis not present

## 2021-01-19 DIAGNOSIS — E7800 Pure hypercholesterolemia, unspecified: Secondary | ICD-10-CM | POA: Diagnosis not present

## 2021-01-19 DIAGNOSIS — J441 Chronic obstructive pulmonary disease with (acute) exacerbation: Secondary | ICD-10-CM | POA: Diagnosis not present

## 2021-01-19 DIAGNOSIS — I110 Hypertensive heart disease with heart failure: Secondary | ICD-10-CM | POA: Diagnosis not present

## 2021-01-19 DIAGNOSIS — I493 Ventricular premature depolarization: Secondary | ICD-10-CM | POA: Diagnosis not present

## 2021-01-26 DIAGNOSIS — D72829 Elevated white blood cell count, unspecified: Secondary | ICD-10-CM | POA: Diagnosis not present

## 2021-01-26 DIAGNOSIS — E1169 Type 2 diabetes mellitus with other specified complication: Secondary | ICD-10-CM | POA: Diagnosis not present

## 2021-01-26 DIAGNOSIS — J441 Chronic obstructive pulmonary disease with (acute) exacerbation: Secondary | ICD-10-CM | POA: Diagnosis not present

## 2021-01-26 DIAGNOSIS — I252 Old myocardial infarction: Secondary | ICD-10-CM | POA: Diagnosis not present

## 2021-01-26 DIAGNOSIS — D509 Iron deficiency anemia, unspecified: Secondary | ICD-10-CM | POA: Diagnosis not present

## 2021-01-26 DIAGNOSIS — I5032 Chronic diastolic (congestive) heart failure: Secondary | ICD-10-CM | POA: Diagnosis not present

## 2021-01-26 DIAGNOSIS — I110 Hypertensive heart disease with heart failure: Secondary | ICD-10-CM | POA: Diagnosis not present

## 2021-01-26 DIAGNOSIS — I2510 Atherosclerotic heart disease of native coronary artery without angina pectoris: Secondary | ICD-10-CM | POA: Diagnosis not present

## 2021-01-26 DIAGNOSIS — I493 Ventricular premature depolarization: Secondary | ICD-10-CM | POA: Diagnosis not present

## 2021-01-26 DIAGNOSIS — E785 Hyperlipidemia, unspecified: Secondary | ICD-10-CM | POA: Diagnosis not present

## 2021-01-26 DIAGNOSIS — I350 Nonrheumatic aortic (valve) stenosis: Secondary | ICD-10-CM | POA: Diagnosis not present

## 2021-01-26 DIAGNOSIS — E7800 Pure hypercholesterolemia, unspecified: Secondary | ICD-10-CM | POA: Diagnosis not present

## 2021-01-26 DIAGNOSIS — E1142 Type 2 diabetes mellitus with diabetic polyneuropathy: Secondary | ICD-10-CM | POA: Diagnosis not present

## 2021-01-29 ENCOUNTER — Encounter: Payer: Self-pay | Admitting: Internal Medicine

## 2021-01-29 ENCOUNTER — Other Ambulatory Visit: Payer: Self-pay

## 2021-01-29 ENCOUNTER — Ambulatory Visit (INDEPENDENT_AMBULATORY_CARE_PROVIDER_SITE_OTHER): Payer: Medicare HMO | Admitting: Internal Medicine

## 2021-01-29 DIAGNOSIS — E7800 Pure hypercholesterolemia, unspecified: Secondary | ICD-10-CM

## 2021-01-29 DIAGNOSIS — M810 Age-related osteoporosis without current pathological fracture: Secondary | ICD-10-CM | POA: Diagnosis not present

## 2021-01-29 DIAGNOSIS — I5032 Chronic diastolic (congestive) heart failure: Secondary | ICD-10-CM

## 2021-01-29 DIAGNOSIS — E1159 Type 2 diabetes mellitus with other circulatory complications: Secondary | ICD-10-CM | POA: Diagnosis not present

## 2021-01-29 DIAGNOSIS — I252 Old myocardial infarction: Secondary | ICD-10-CM | POA: Diagnosis not present

## 2021-01-29 DIAGNOSIS — I25118 Atherosclerotic heart disease of native coronary artery with other forms of angina pectoris: Secondary | ICD-10-CM

## 2021-01-29 DIAGNOSIS — D509 Iron deficiency anemia, unspecified: Secondary | ICD-10-CM

## 2021-01-29 DIAGNOSIS — I10 Essential (primary) hypertension: Secondary | ICD-10-CM | POA: Diagnosis not present

## 2021-01-29 NOTE — Progress Notes (Signed)
[name], female   DOB: 1933-10-30, 87 y.o.   MRN: [id number]   Subjective:    [name], female    DOB: 1933-01-01, 87 y.o.   MRN: [id number]  HPI This visit occurred during the SARS-CoV-2 public health emergency.  Safety protocols were in place, including screening questions prior to the visit, additional usage of staff PPE, and extensive cleaning of exam room while observing appropriate contact time as indicated for disinfecting solutions.  Patient here for a scheduled follow up. Was seen in ER 12/19/20 and diagnosed with covid. Symptoms resolved and she was reevaluated in ER 01/11/21 and diagnosed with COPD exacerbation.  Treated with prednisone taper.  States her breathing is better now.  Back to baseline.  No chest pain.  Eating.  No nausea or vomiting.  Bowels moving.  Using inhalers regularly.  States sugars are doing well.   Past Medical History:  Diagnosis Date  . Aortic insufficiency    a. TTE 12/19: EF 55-60%, probable HK of the mid apical anterior septal myocardium, Gr1DD, mild AI, mildly dilated LA  . Asthma   . CAD (coronary artery disease)    a. NSTEMI 12/19; b. LHC 10/08/18: LM minimal luminal irregs, mLAD-1 95% s/p PCI/DES, mLAD-2 60%, LCx mild diffuse disease throughout, RCA minimal luminal irregs; b. 03/2020 MV: EF>65%, no ischemia/scar.  . CHF (congestive heart failure) ([place])   . Diabetes mellitus ([place])   . Hypercholesterolemia   . Hypertension   . Myocardial infarction ([place])   . Osteopenia   . Palpitations    a. 04/2020 Zio: Avg HR 75. 429 SVT episodes, longest 19 secs @ 133. Occas PACs (3.2%). Rare PVCs (<1%).  . Polymyalgia rheumatica syndrome ([place])   . Reactive airway disease    Past Surgical History:  Procedure Laterality Date  . ABDOMINAL HYSTERECTOMY  1981   prolapse and bleeding, ovaries not removed  . BREAST EXCISIONAL BIOPSY Right   . CHOLECYSTECTOMY N/A 09/02/2019   Procedure: LAPAROSCOPIC CHOLECYSTECTOMY WITH  INTRAOPERATIVE CHOLANGIOGRAM;  Surgeon: Olean Ree, MD;  Location: [facility];  Service: General;  Laterality: N/A;  . CORONARY STENT INTERVENTION N/A 10/08/2018   Procedure: CORONARY STENT INTERVENTION;  Surgeon: Wellington Hampshire, MD;  Location: [facility];  Service: Cardiovascular;  Laterality: N/A;  . ENDOSCOPIC RETROGRADE CHOLANGIOPANCREATOGRAPHY (ERCP) WITH PROPOFOL N/A 08/08/2019   Procedure: ENDOSCOPIC RETROGRADE CHOLANGIOPANCREATOGRAPHY (ERCP) WITH PROPOFOL;  Surgeon: Lucilla Lame, MD;  Location: [facility];  Service: Endoscopy;  Laterality: N/A;  . LEFT HEART CATH AND CORONARY ANGIOGRAPHY N/A 10/08/2018   Procedure: LEFT HEART CATH AND CORONARY ANGIOGRAPHY;  Surgeon: Wellington Hampshire, MD;  Location: [facility];  Service: Cardiovascular;  Laterality: N/A;  . LEFT HEART CATH AND CORONARY ANGIOGRAPHY N/A 08/10/2020   Procedure: LEFT HEART CATH AND CORONARY ANGIOGRAPHY possible percutaneous intervention;  Surgeon: Wellington Hampshire, MD;  Location: [facility];  Service: Cardiovascular;  Laterality: N/A;  . UMBILICAL HERNIA REPAIR  7/94   Family History  Problem Relation Age of Onset  . Heart attack Father   . Arthritis Mother   . Heart disease Mother   . Throat cancer Sister   . Parkinson's disease Sister   . COPD Brother    Social History   Socioeconomic History  . Marital status: Widowed    Spouse name: Not on file  . Number of children: 3  . Years of education: Not on file  . Highest education level: Not on file  Occupational History  . Not on file  Tobacco Use  . Smoking status: Never Smoker  . Smokeless tobacco: Never Used  Vaping Use  . Vaping Use: Never used  Substance and Sexual Activity  . Alcohol use: No    Alcohol/week: 0.0 standard drinks  . Drug use: No  . Sexual activity: Not Currently  Other Topics Concern  . 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 Does all of her own housework. Dtr does food shopping for her.   Social Determinants of Health   Financial Resource Strain: Not on file  Food Insecurity: Not on file  Transportation Needs: Not on file  Physical Activity: Not on file  Stress: Not on file  Social Connections: Not on file    Outpatient Encounter Medications as of 01/29/2021  Medication Sig  . ADVAIR DISKUS 250-50 MCG/DOSE AEPB INHALE ONE PUFF BY MOUTH EVERY 12 HOURS. RINSE MOUTH AFTER EACH USE (Patient taking differently: Inhale 1 puff into the lungs 2 (two) times daily.)  . albuterol (PROVENTIL) (2.5 MG/3ML) 0.083% nebulizer solution USE 1 VIAL IN NEBULIZER EVERY 4 HOURS AS NEEDED FOR WHEEZING OR SHORTNESS OF BREATH (Patient taking differently: Take 2.5 mg by nebulization in the morning and at bedtime.)  . alendronate (FOSAMAX) 70 MG tablet Take 70 mg by mouth once a week.   Marland Kitchen aspirin 81 MG tablet Take 81 mg by mouth daily.  Marland Kitchen atorvastatin (LIPITOR) 80 MG tablet Take 1 tablet (80 mg total) by mouth daily at 6 PM.  . fluticasone (FLONASE) 50 MCG/ACT nasal spray Use 2 spray(s) in each nostril once daily (Patient taking differently: Place 2 sprays into both nostrils daily.)  . lisinopril (ZESTRIL) 20 MG tablet Take 1 tablet by mouth once daily (Patient taking differently: Take 20 mg by mouth daily.)  . metFORMIN (GLUCOPHAGE) 500 MG tablet TAKE 1 TABLET BY MOUTH TWICE DAILY WITH A MEAL (Patient taking differently: Take 500 mg by mouth 2 (two) times daily with a meal.)  . metoprolol tartrate (LOPRESSOR) 25 MG tablet Take 1 tablet by mouth twice daily (Patient taking differently: Take 25 mg by mouth 2 (two) times daily.)  . Nebulizers (COMPRESSOR/NEBULIZER) MISC 1 Units by Does not apply route as directed.  . nitroGLYCERIN (NITROSTAT) 0.4 MG SL tablet Place 1 tablet (0.4 mg total) under the tongue every 5 (five) minutes as needed for chest pain.  . predniSONE (DELTASONE) 10 MG tablet Take 1 tablet (10 mg total) by mouth  daily. Day 1-3: take 4 tablets PO daily Day 4-6: take 3 tablets PO daily Day 7-9: take 2 tablets PO daily Day 10-12: take 1 tablet PO daily  . PROAIR HFA 108 (90 Base) MCG/ACT inhaler Inhale 2 puffs into the lungs every 4 (four) hours as needed for wheezing or shortness of breath.  . Vitamin D, Ergocalciferol, (DRISDOL) 50000 units CAPS capsule Take 50,000 Units by mouth once a week.   No facility-administered encounter medications on file as of 01/29/2021.    Review of Systems  Constitutional: Negative for appetite change and unexpected weight change.  HENT: Negative for congestion and sinus pressure.   Respiratory: Negative for cough and chest tightness.        Breathing stable.   Cardiovascular: Negative for chest pain, palpitations and leg swelling.  Gastrointestinal: Negative for abdominal pain, diarrhea, nausea and vomiting.  Genitourinary: Negative for difficulty urinating and dysuria.  Musculoskeletal: Negative for joint  swelling and myalgias.  Skin: Negative for color change and rash.  Neurological: Negative for dizziness, light-headedness and headaches.  Psychiatric/Behavioral: Negative for agitation and dysphoric mood.       Objective:    Physical Exam Vitals reviewed.  Constitutional:      General: She is not in acute distress.    Appearance: Normal appearance.  HENT:     Head: Normocephalic and atraumatic.     Right Ear: External ear normal.     Left Ear: External ear normal.  Eyes:     General: No scleral icterus.       Right eye: No discharge.        Left eye: No discharge.     Conjunctiva/sclera: Conjunctivae normal.  Neck:     Thyroid: No thyromegaly.  Cardiovascular:     Rate and Rhythm: Normal rate and regular rhythm.  Pulmonary:     Effort: No respiratory distress.     Breath sounds: Normal breath sounds. No wheezing.  Abdominal:     General: Bowel sounds are normal.     Palpations: Abdomen is soft.     Tenderness: There is no abdominal tenderness.   Musculoskeletal:        General: No swelling or tenderness.     Cervical back: Neck supple. No tenderness.  Lymphadenopathy:     Cervical: No cervical adenopathy.  Skin:    Findings: No erythema or rash.  Neurological:     Mental Status: She is alert.  Psychiatric:        Mood and Affect: Mood normal.        Behavior: Behavior normal.     BP (!) 120/56 (BP Location: Left Arm, Patient Position: Sitting)   Pulse (!) 114   Temp 98.3 F (36.8 C)   Ht 5' 0.98" (1.549 m)   Wt 122 lb 6.4 oz (55.5 kg)   SpO2 97%   BMI 23.14 kg/m  Wt Readings from Last 3 Encounters:  01/29/21 122 lb 6.4 oz (55.5 kg)  01/11/21 118 lb (53.5 kg)  12/19/20 118 lb (53.5 kg)     Lab Results  Component Value Date   WBC 11.1 (H) 01/11/2021   HGB 13.5 01/11/2021   HCT 41.4 01/11/2021   PLT 286 01/11/2021   GLUCOSE 149 (H) 01/11/2021   CHOL 116 08/08/2020   TRIG 164 (H) 08/08/2020   HDL 46 08/08/2020   LDLDIRECT 136.0 07/03/2020   LDLCALC 37 08/08/2020   ALT 31 12/19/2020   AST 45 (H) 12/19/2020   NA 141 01/11/2021   K 3.9 01/11/2021   CL 107 01/11/2021   CREATININE 0.67 01/11/2021   BUN 20 01/11/2021   CO2 25 01/11/2021   TSH 3.59 09/17/2019   INR 1.1 08/07/2020   HGBA1C 6.3 (H) 10/08/2020   MICROALBUR 3.1 (H) 07/03/2020    DG Chest 2 View  Result Date: 01/11/2021 CLINICAL DATA:  Short of breath EXAM: CHEST - 2 VIEW COMPARISON:  12/19/2020 FINDINGS: Heart size and vascularity normal. Atherosclerotic calcification aortic arch. Lungs clear without infiltrate or effusion. No acute skeletal abnormality. IMPRESSION: No active cardiopulmonary disease. Electronically Signed   By: Franchot Gallo M.D.   On: 01/11/2021 09:51       Assessment & Plan:   Problem List Items Addressed This Visit    Anemia    Follow cbc.       Relevant Orders   CBC with Differential/Platelet   Chronic diastolic CHF (congestive heart failure) ([place])    Continue  lisinopril, imdur and metoprolol.  No evidence of  volume overload.        Coronary artery disease of native heart with stable angina pectoris ([place])    Known CAD s/p PCI/DES - mid LAD.  Continue metoprolol, imdur and lisinopril.  No chest pain.  Breathing stable.  Follow.       Diabetes mellitus with cardiac complication ([place])    Continues on metformin.  States sugars are doing well.  Follow met b and a1c.       Relevant Orders   Hemoglobin W0J   Basic metabolic panel   History of non-ST elevation myocardial infarction (NSTEMI)    Previously admitted for NSTEMI.  Elected Network engineer.  Continue lipitor, metoprolol and imdur. No chest pain.        Hypercholesterolemia    Continue lipitor.  Follow lipid panel and liver function tests.        Relevant Orders   Hepatic function panel   Lipid panel   Hypertension    Continue lisinopril, imdur and metoprolol.  Follow pressures.  Follow metabolic panel.       Relevant Orders   TSH   Osteoporosis    Continues on fosamax.            Einar Pheasant, MD

## 2021-01-30 ENCOUNTER — Encounter: Payer: Self-pay | Admitting: Internal Medicine

## 2021-01-30 NOTE — Assessment & Plan Note (Signed)
Continue lipitor.  Follow lipid panel and liver function tests.   

## 2021-01-30 NOTE — Assessment & Plan Note (Signed)
Continue lisinopril, imdur and metoprolol.  Follow pressures.  Follow metabolic panel.

## 2021-01-30 NOTE — Assessment & Plan Note (Signed)
Continues on fosamax.  

## 2021-01-30 NOTE — Assessment & Plan Note (Signed)
Previously admitted for NSTEMI.  Elected Microbiologist.  Continue lipitor, metoprolol and imdur. No chest pain.

## 2021-01-30 NOTE — Progress Notes (Incomplete)
Patient ID: Regan Lemming, female   DOB: 1933-10-30, 87 y.o.   MRN: [id number]   Subjective:    Patient ID: Regan Lemming, female    DOB: 1933-01-01, 87 y.o.   MRN: [id number]  HPI This visit occurred during the SARS-CoV-2 public health emergency.  Safety protocols were in place, including screening questions prior to the visit, additional usage of staff PPE, and extensive cleaning of exam room while observing appropriate contact time as indicated for disinfecting solutions.  Patient here for a scheduled follow up. Was seen in ER 12/19/20 and diagnosed with covid. Symptoms resolved and she was reevaluated in ER 01/11/21 and diagnosed with COPD exacerbation.  Treated with prednisone taper.  States her breathing is better now.  Back to baseline.  No chest pain.  Eating.  No nausea or vomiting.  Bowels moving.  Using inhalers regularly.  States sugars are doing well.   Past Medical History:  Diagnosis Date  . Aortic insufficiency    a. TTE 12/19: EF 55-60%, probable HK of the mid apical anterior septal myocardium, Gr1DD, mild AI, mildly dilated LA  . Asthma   . CAD (coronary artery disease)    a. NSTEMI 12/19; b. LHC 10/08/18: LM minimal luminal irregs, mLAD-1 95% s/p PCI/DES, mLAD-2 60%, LCx mild diffuse disease throughout, RCA minimal luminal irregs; b. 03/2020 MV: EF>65%, no ischemia/scar.  . CHF (congestive heart failure) ([place])   . Diabetes mellitus ([place])   . Hypercholesterolemia   . Hypertension   . Myocardial infarction ([place])   . Osteopenia   . Palpitations    a. 04/2020 Zio: Avg HR 75. 429 SVT episodes, longest 19 secs @ 133. Occas PACs (3.2%). Rare PVCs (<1%).  . Polymyalgia rheumatica syndrome ([place])   . Reactive airway disease    Past Surgical History:  Procedure Laterality Date  . ABDOMINAL HYSTERECTOMY  1981   prolapse and bleeding, ovaries not removed  . BREAST EXCISIONAL BIOPSY Right   . CHOLECYSTECTOMY N/A 09/02/2019   Procedure: LAPAROSCOPIC CHOLECYSTECTOMY WITH  INTRAOPERATIVE CHOLANGIOGRAM;  Surgeon: Olean Ree, MD;  Location: [facility];  Service: General;  Laterality: N/A;  . CORONARY STENT INTERVENTION N/A 10/08/2018   Procedure: CORONARY STENT INTERVENTION;  Surgeon: Wellington Hampshire, MD;  Location: [facility];  Service: Cardiovascular;  Laterality: N/A;  . ENDOSCOPIC RETROGRADE CHOLANGIOPANCREATOGRAPHY (ERCP) WITH PROPOFOL N/A 08/08/2019   Procedure: ENDOSCOPIC RETROGRADE CHOLANGIOPANCREATOGRAPHY (ERCP) WITH PROPOFOL;  Surgeon: Lucilla Lame, MD;  Location: [facility];  Service: Endoscopy;  Laterality: N/A;  . LEFT HEART CATH AND CORONARY ANGIOGRAPHY N/A 10/08/2018   Procedure: LEFT HEART CATH AND CORONARY ANGIOGRAPHY;  Surgeon: Wellington Hampshire, MD;  Location: [facility];  Service: Cardiovascular;  Laterality: N/A;  . LEFT HEART CATH AND CORONARY ANGIOGRAPHY N/A 08/10/2020   Procedure: LEFT HEART CATH AND CORONARY ANGIOGRAPHY possible percutaneous intervention;  Surgeon: Wellington Hampshire, MD;  Location: [facility];  Service: Cardiovascular;  Laterality: N/A;  . UMBILICAL HERNIA REPAIR  7/94   Family History  Problem Relation Age of Onset  . Heart attack Father   . Arthritis Mother   . Heart disease Mother   . Throat cancer Sister   . Parkinson's disease Sister   . COPD Brother    Social History   Socioeconomic History  . Marital status: Widowed    Spouse name: Not on file  . Number of children: 3  . Years of education: Not on file  . Highest education level: Not on file  Occupational History  . Not on file  Tobacco Use  . Smoking status: Never Smoker  . Smokeless tobacco: Never Used  Vaping Use  . Vaping Use: Never used  Substance and Sexual Activity  . Alcohol use: No    Alcohol/week: 0.0 standard drinks  . Drug use: No  . Sexual activity: Not Currently  Other Topics Concern  . 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 Does all of her own housework. Dtr does food shopping for her.   Social Determinants of Health   Financial Resource Strain: Not on file  Food Insecurity: Not on file  Transportation Needs: Not on file  Physical Activity: Not on file  Stress: Not on file  Social Connections: Not on file    Outpatient Encounter Medications as of 01/29/2021  Medication Sig  . ADVAIR DISKUS 250-50 MCG/DOSE AEPB INHALE ONE PUFF BY MOUTH EVERY 12 HOURS. RINSE MOUTH AFTER EACH USE (Patient taking differently: Inhale 1 puff into the lungs 2 (two) times daily.)  . albuterol (PROVENTIL) (2.5 MG/3ML) 0.083% nebulizer solution USE 1 VIAL IN NEBULIZER EVERY 4 HOURS AS NEEDED FOR WHEEZING OR SHORTNESS OF BREATH (Patient taking differently: Take 2.5 mg by nebulization in the morning and at bedtime.)  . alendronate (FOSAMAX) 70 MG tablet Take 70 mg by mouth once a week.   Marland Kitchen aspirin 81 MG tablet Take 81 mg by mouth daily.  Marland Kitchen atorvastatin (LIPITOR) 80 MG tablet Take 1 tablet (80 mg total) by mouth daily at 6 PM.  . fluticasone (FLONASE) 50 MCG/ACT nasal spray Use 2 spray(s) in each nostril once daily (Patient taking differently: Place 2 sprays into both nostrils daily.)  . lisinopril (ZESTRIL) 20 MG tablet Take 1 tablet by mouth once daily (Patient taking differently: Take 20 mg by mouth daily.)  . metFORMIN (GLUCOPHAGE) 500 MG tablet TAKE 1 TABLET BY MOUTH TWICE DAILY WITH A MEAL (Patient taking differently: Take 500 mg by mouth 2 (two) times daily with a meal.)  . metoprolol tartrate (LOPRESSOR) 25 MG tablet Take 1 tablet by mouth twice daily (Patient taking differently: Take 25 mg by mouth 2 (two) times daily.)  . Nebulizers (COMPRESSOR/NEBULIZER) MISC 1 Units by Does not apply route as directed.  . nitroGLYCERIN (NITROSTAT) 0.4 MG SL tablet Place 1 tablet (0.4 mg total) under the tongue every 5 (five) minutes as needed for chest pain.  . predniSONE (DELTASONE) 10 MG tablet Take 1 tablet (10 mg total) by mouth  daily. Day 1-3: take 4 tablets PO daily Day 4-6: take 3 tablets PO daily Day 7-9: take 2 tablets PO daily Day 10-12: take 1 tablet PO daily  . PROAIR HFA 108 (90 Base) MCG/ACT inhaler Inhale 2 puffs into the lungs every 4 (four) hours as needed for wheezing or shortness of breath.  . Vitamin D, Ergocalciferol, (DRISDOL) 50000 units CAPS capsule Take 50,000 Units by mouth once a week.   No facility-administered encounter medications on file as of 01/29/2021.    Review of Systems  Constitutional: Negative for appetite change and unexpected weight change.  HENT: Negative for congestion and sinus pressure.   Respiratory: Negative for cough and chest tightness.        Breathing stable.   Cardiovascular: Negative for chest pain, palpitations and leg swelling.  Gastrointestinal: Negative for abdominal pain, diarrhea, nausea and vomiting.  Genitourinary: Negative for difficulty urinating and dysuria.  Musculoskeletal: Negative for joint  swelling and myalgias.  Skin: Negative for color change and rash.  Neurological: Negative for dizziness, light-headedness and headaches.  Psychiatric/Behavioral: Negative for agitation and dysphoric mood.       Objective:    Physical Exam Vitals reviewed.  Constitutional:      General: She is not in acute distress.    Appearance: Normal appearance.  HENT:     Head: Normocephalic and atraumatic.     Right Ear: External ear normal.     Left Ear: External ear normal.  Eyes:     General: No scleral icterus.       Right eye: No discharge.        Left eye: No discharge.     Conjunctiva/sclera: Conjunctivae normal.  Neck:     Thyroid: No thyromegaly.  Cardiovascular:     Rate and Rhythm: Normal rate and regular rhythm.  Pulmonary:     Effort: No respiratory distress.     Breath sounds: Normal breath sounds. No wheezing.  Abdominal:     General: Bowel sounds are normal.     Palpations: Abdomen is soft.     Tenderness: There is no abdominal tenderness.   Musculoskeletal:        General: No swelling or tenderness.     Cervical back: Neck supple. No tenderness.  Lymphadenopathy:     Cervical: No cervical adenopathy.  Skin:    Findings: No erythema or rash.  Neurological:     Mental Status: She is alert.  Psychiatric:        Mood and Affect: Mood normal.        Behavior: Behavior normal.     BP (!) 120/56 (BP Location: Left Arm, Patient Position: Sitting)   Pulse (!) 114   Temp 98.3 F (36.8 C)   Ht 5' 0.98" (1.549 m)   Wt 122 lb 6.4 oz (55.5 kg)   SpO2 97%   BMI 23.14 kg/m  Wt Readings from Last 3 Encounters:  01/29/21 122 lb 6.4 oz (55.5 kg)  01/11/21 118 lb (53.5 kg)  12/19/20 118 lb (53.5 kg)     Lab Results  Component Value Date   WBC 11.1 (H) 01/11/2021   HGB 13.5 01/11/2021   HCT 41.4 01/11/2021   PLT 286 01/11/2021   GLUCOSE 149 (H) 01/11/2021   CHOL 116 08/08/2020   TRIG 164 (H) 08/08/2020   HDL 46 08/08/2020   LDLDIRECT 136.0 07/03/2020   LDLCALC 37 08/08/2020   ALT 31 12/19/2020   AST 45 (H) 12/19/2020   NA 141 01/11/2021   K 3.9 01/11/2021   CL 107 01/11/2021   CREATININE 0.67 01/11/2021   BUN 20 01/11/2021   CO2 25 01/11/2021   TSH 3.59 09/17/2019   INR 1.1 08/07/2020   HGBA1C 6.3 (H) 10/08/2020   MICROALBUR 3.1 (H) 07/03/2020    DG Chest 2 View  Result Date: 01/11/2021 CLINICAL DATA:  Short of breath EXAM: CHEST - 2 VIEW COMPARISON:  12/19/2020 FINDINGS: Heart size and vascularity normal. Atherosclerotic calcification aortic arch. Lungs clear without infiltrate or effusion. No acute skeletal abnormality. IMPRESSION: No active cardiopulmonary disease. Electronically Signed   By: Marlan Palau M.D.   On: 01/11/2021 09:51       Assessment & Plan:   Problem List Items Addressed This Visit   None      Dale , MD

## 2021-01-31 NOTE — Assessment & Plan Note (Signed)
Follow cbc.  

## 2021-01-31 NOTE — Assessment & Plan Note (Signed)
Known CAD s/p PCI/DES - mid LAD.  Continue metoprolol, imdur and lisinopril.  No chest pain.  Breathing stable.  Follow.

## 2021-01-31 NOTE — Assessment & Plan Note (Signed)
Continue lisinopril, imdur and metoprolol.  No evidence of volume overload.

## 2021-01-31 NOTE — Assessment & Plan Note (Signed)
Continues on metformin.  States sugars are doing well.  Follow met b and a1c.

## 2021-02-14 ENCOUNTER — Other Ambulatory Visit: Payer: Self-pay | Admitting: Internal Medicine

## 2021-02-15 DIAGNOSIS — M810 Age-related osteoporosis without current pathological fracture: Secondary | ICD-10-CM | POA: Diagnosis not present

## 2021-02-18 ENCOUNTER — Ambulatory Visit (INDEPENDENT_AMBULATORY_CARE_PROVIDER_SITE_OTHER): Payer: Medicare HMO

## 2021-02-18 ENCOUNTER — Other Ambulatory Visit: Payer: Self-pay

## 2021-02-18 DIAGNOSIS — E538 Deficiency of other specified B group vitamins: Secondary | ICD-10-CM | POA: Diagnosis not present

## 2021-02-18 MED ORDER — CYANOCOBALAMIN 1000 MCG/ML IJ SOLN
1000.0000 ug | Freq: Once | INTRAMUSCULAR | Status: AC
Start: 2021-02-18 — End: 2021-02-18
  Administered 2021-02-18: 1000 ug via INTRAMUSCULAR

## 2021-02-19 NOTE — Progress Notes (Signed)
Patient presented for B 12 injection to left deltoid, patient voiced no concerns nor showed any signs of distress during injection. 

## 2021-02-19 NOTE — Progress Notes (Signed)
It has been sent to the provider for cosign.

## 2021-03-01 ENCOUNTER — Emergency Department: Payer: Medicare HMO

## 2021-03-01 ENCOUNTER — Observation Stay
Admission: EM | Admit: 2021-03-01 | Discharge: 2021-03-02 | Disposition: A | Discharge status: Home / Self Care | Payer: Medicare HMO | Attending: Internal Medicine | Admitting: Internal Medicine

## 2021-03-01 ENCOUNTER — Other Ambulatory Visit: Payer: Self-pay

## 2021-03-01 DIAGNOSIS — Z8616 Personal history of COVID-19: Secondary | ICD-10-CM | POA: Diagnosis not present

## 2021-03-01 DIAGNOSIS — J45909 Unspecified asthma, uncomplicated: Secondary | ICD-10-CM | POA: Insufficient documentation

## 2021-03-01 DIAGNOSIS — Z20822 Contact with and (suspected) exposure to covid-19: Secondary | ICD-10-CM | POA: Insufficient documentation

## 2021-03-01 DIAGNOSIS — R0602 Shortness of breath: Secondary | ICD-10-CM | POA: Diagnosis not present

## 2021-03-01 DIAGNOSIS — Z7984 Long term (current) use of oral hypoglycemic drugs: Secondary | ICD-10-CM | POA: Diagnosis not present

## 2021-03-01 DIAGNOSIS — I2510 Atherosclerotic heart disease of native coronary artery without angina pectoris: Secondary | ICD-10-CM | POA: Diagnosis not present

## 2021-03-01 DIAGNOSIS — R0600 Dyspnea, unspecified: Secondary | ICD-10-CM | POA: Diagnosis not present

## 2021-03-01 DIAGNOSIS — Z7982 Long term (current) use of aspirin: Secondary | ICD-10-CM | POA: Diagnosis not present

## 2021-03-01 DIAGNOSIS — J9811 Atelectasis: Secondary | ICD-10-CM | POA: Diagnosis not present

## 2021-03-01 DIAGNOSIS — R0689 Other abnormalities of breathing: Secondary | ICD-10-CM | POA: Diagnosis not present

## 2021-03-01 DIAGNOSIS — Z79899 Other long term (current) drug therapy: Secondary | ICD-10-CM | POA: Diagnosis not present

## 2021-03-01 DIAGNOSIS — E119 Type 2 diabetes mellitus without complications: Secondary | ICD-10-CM | POA: Diagnosis not present

## 2021-03-01 DIAGNOSIS — I110 Hypertensive heart disease with heart failure: Secondary | ICD-10-CM | POA: Insufficient documentation

## 2021-03-01 DIAGNOSIS — J441 Chronic obstructive pulmonary disease with (acute) exacerbation: Secondary | ICD-10-CM | POA: Diagnosis not present

## 2021-03-01 DIAGNOSIS — I5032 Chronic diastolic (congestive) heart failure: Secondary | ICD-10-CM | POA: Diagnosis not present

## 2021-03-01 DIAGNOSIS — R6889 Other general symptoms and signs: Secondary | ICD-10-CM | POA: Diagnosis not present

## 2021-03-01 DIAGNOSIS — Z743 Need for continuous supervision: Secondary | ICD-10-CM | POA: Diagnosis not present

## 2021-03-01 LAB — BASIC METABOLIC PANEL WITH GFR
Anion gap: 9 (ref 5–15)
BUN: 16 mg/dL (ref 8–23)
CO2: 26 mmol/L (ref 22–32)
Calcium: 9.7 mg/dL (ref 8.9–10.3)
Chloride: 103 mmol/L (ref 98–111)
Creatinine, Ser: 0.74 mg/dL (ref 0.44–1.00)
GFR, Estimated: >60 mL/min
Glucose, Bld: 149 mg/dL — ABNORMAL HIGH (ref 70–99)
Potassium: 4.1 mmol/L (ref 3.5–5.1)
Sodium: 138 mmol/L (ref 135–145)

## 2021-03-01 LAB — CBC
HCT: 41.7 % (ref 36.0–46.0)
Hemoglobin: 13.7 g/dL (ref 12.0–15.0)
MCH: 28.7 pg (ref 26.0–34.0)
MCHC: 32.9 g/dL (ref 30.0–36.0)
MCV: 87.4 fL (ref 80.0–100.0)
Platelets: 268 10*3/uL (ref 150–400)
RBC: 4.77 MIL/uL (ref 3.87–5.11)
RDW: 13.8 % (ref 11.5–15.5)
WBC: 11.8 10*3/uL — ABNORMAL HIGH (ref 4.0–10.5)
nRBC: 0 % (ref 0.0–0.2)

## 2021-03-01 LAB — URINALYSIS, COMPLETE (UACMP) WITH MICROSCOPIC
Bilirubin Urine: NEGATIVE
Glucose, UA: >=500 mg/dL — AB
Hgb urine dipstick: NEGATIVE
Ketones, ur: 20 mg/dL — AB
Leukocytes,Ua: NEGATIVE
Nitrite: NEGATIVE
Protein, ur: NEGATIVE mg/dL
Specific Gravity, Urine: 1.017 (ref 1.005–1.030)
pH: 5 (ref 5.0–8.0)

## 2021-03-01 LAB — HEMOGLOBIN A1C
Hgb A1c MFr Bld: 7.2 % — ABNORMAL HIGH (ref 4.8–5.6)
Mean Plasma Glucose: 159.94 mg/dL

## 2021-03-01 LAB — GLUCOSE, CAPILLARY
Glucose-Capillary: 268 mg/dL — ABNORMAL HIGH (ref 70–99)
Glucose-Capillary: 202 mg/dL — ABNORMAL HIGH (ref 70–99)

## 2021-03-01 LAB — TROPONIN I (HIGH SENSITIVITY)
Troponin I (High Sensitivity): 6 ng/L
Troponin I (High Sensitivity): 6 ng/L

## 2021-03-01 MED ORDER — INSULIN ASPART 100 UNIT/ML IJ SOLN
0.0000 [IU] | Freq: Every day | INTRAMUSCULAR | Status: DC
  Administered 2021-03-01: 2 [IU] via SUBCUTANEOUS
  Filled 2021-03-01: qty 1

## 2021-03-01 MED ORDER — ASPIRIN EC 81 MG PO TBEC
81.0000 mg | DELAYED_RELEASE_TABLET | Freq: Every day | ORAL | Status: DC
  Administered 2021-03-01 – 2021-03-02 (×2): 81 mg via ORAL
  Filled 2021-03-01 (×2): qty 1

## 2021-03-01 MED ORDER — FLUTICASONE PROPIONATE 50 MCG/ACT NA SUSP
2.0000 | Freq: Every day | NASAL | Status: DC
  Administered 2021-03-02: 2 via NASAL
  Filled 2021-03-01: qty 16

## 2021-03-01 MED ORDER — IPRATROPIUM-ALBUTEROL 0.5-2.5 (3) MG/3ML IN SOLN
3.0000 mL | Freq: Four times a day (QID) | RESPIRATORY_TRACT | Status: DC
  Administered 2021-03-01: 3 mL via RESPIRATORY_TRACT
  Filled 2021-03-01: qty 3

## 2021-03-01 MED ORDER — ENOXAPARIN SODIUM 40 MG/0.4ML IJ SOSY
40.0000 mg | PREFILLED_SYRINGE | INTRAMUSCULAR | Status: DC
  Administered 2021-03-01: 40 mg via SUBCUTANEOUS
  Filled 2021-03-01: qty 0.4

## 2021-03-01 MED ORDER — ACETAMINOPHEN 325 MG PO TABS: 650.0000 mg | ORAL_TABLET | Freq: Four times a day (QID) | ORAL | Status: DC | PRN

## 2021-03-01 MED ORDER — METOPROLOL TARTRATE 25 MG PO TABS
25.0000 mg | ORAL_TABLET | Freq: Two times a day (BID) | ORAL | Status: DC
  Administered 2021-03-01 – 2021-03-02 (×2): 25 mg via ORAL
  Filled 2021-03-01 (×2): qty 1

## 2021-03-01 MED ORDER — PREDNISONE 20 MG PO TABS
40.0000 mg | ORAL_TABLET | Freq: Every day | ORAL | Status: DC
  Administered 2021-03-02: 40 mg via ORAL
  Filled 2021-03-01: qty 2

## 2021-03-01 MED ORDER — ONDANSETRON HCL 4 MG/2ML IJ SOLN: 4.0000 mg | Freq: Four times a day (QID) | INTRAMUSCULAR | Status: DC | PRN

## 2021-03-01 MED ORDER — GUAIFENESIN ER 600 MG PO TB12
1200.0000 mg | ORAL_TABLET | Freq: Two times a day (BID) | ORAL | Status: DC
  Administered 2021-03-01 – 2021-03-02 (×3): 1200 mg via ORAL
  Filled 2021-03-01 (×3): qty 2

## 2021-03-01 MED ORDER — IPRATROPIUM-ALBUTEROL 0.5-2.5 (3) MG/3ML IN SOLN
3.0000 mL | Freq: Once | RESPIRATORY_TRACT | Status: AC
  Administered 2021-03-01: 3 mL via RESPIRATORY_TRACT
  Filled 2021-03-01: qty 3

## 2021-03-01 MED ORDER — MELATONIN 5 MG PO TABS: 5.0000 mg | ORAL_TABLET | Freq: Every evening | ORAL | Status: DC | PRN

## 2021-03-01 MED ORDER — LACTATED RINGERS IV SOLN: INTRAVENOUS | Status: DC

## 2021-03-01 MED ORDER — AZITHROMYCIN 250 MG PO TABS: 250.0000 mg | ORAL_TABLET | Freq: Every day | ORAL | Status: DC

## 2021-03-01 MED ORDER — MOMETASONE FURO-FORMOTEROL FUM 200-5 MCG/ACT IN AERO
2.0000 | INHALATION_SPRAY | Freq: Two times a day (BID) | RESPIRATORY_TRACT | Status: DC
  Administered 2021-03-01 – 2021-03-02 (×2): 2 via RESPIRATORY_TRACT
  Filled 2021-03-01: qty 8.8

## 2021-03-01 MED ORDER — ATORVASTATIN CALCIUM 80 MG PO TABS
80.0000 mg | ORAL_TABLET | Freq: Every day | ORAL | Status: DC
  Administered 2021-03-01: 80 mg via ORAL
  Filled 2021-03-01: qty 4
  Filled 2021-03-01 (×2): qty 1

## 2021-03-01 MED ORDER — HYDRALAZINE HCL 20 MG/ML IJ SOLN: 5.0000 mg | Freq: Four times a day (QID) | INTRAMUSCULAR | Status: DC | PRN

## 2021-03-01 MED ORDER — INSULIN ASPART 100 UNIT/ML IJ SOLN
0.0000 [IU] | Freq: Three times a day (TID) | INTRAMUSCULAR | Status: DC
  Administered 2021-03-01: 5 [IU] via SUBCUTANEOUS
  Administered 2021-03-02: 2 [IU] via SUBCUTANEOUS
  Filled 2021-03-01 (×2): qty 1

## 2021-03-01 MED ORDER — PREDNISONE 20 MG PO TABS
60.0000 mg | ORAL_TABLET | Freq: Once | ORAL | Status: AC
  Administered 2021-03-01: 60 mg via ORAL
  Filled 2021-03-01: qty 3

## 2021-03-01 MED ORDER — AZITHROMYCIN 500 MG PO TABS
500.0000 mg | ORAL_TABLET | Freq: Every day | ORAL | Status: AC
  Administered 2021-03-01: 500 mg via ORAL
  Filled 2021-03-01: qty 1

## 2021-03-01 MED ORDER — METHYLPREDNISOLONE SODIUM SUCC 125 MG IJ SOLR
125.0000 mg | Freq: Once | INTRAMUSCULAR | Status: AC
  Administered 2021-03-01: 125 mg via INTRAVENOUS
  Filled 2021-03-01: qty 2

## 2021-03-01 NOTE — ED Triage Notes (Signed)
Pt comes via EMS from home with c/o SOB that started this am. Pt states she does have COPD.   Pt used neb at home with no relief.  EMS reports expir wheezes, CBG-176, RR-24-30, HR-87, BP-149/62, O2-92%RA

## 2021-03-01 NOTE — ED Notes (Signed)
Pt assisted to restroom, steady gait when ambulating. Assisted back to bed. Updated patient on having inpatient room assigned, pt denies needs at this time. Stretcher is locked in low position with side rails up x2 and call light in reach, her daughter remains at the bedside.

## 2021-03-01 NOTE — ED Notes (Signed)
See triage note  Presents with some SOB  States she woke up some wheezes this am   States she used her SVN and inhaler w/o relief  No fever

## 2021-03-01 NOTE — ED Notes (Addendum)
Patient's oxygen saturation dropped to high 80's per Dr Marisa Severin who placed her on 2L of oxygen via nasal cannula. Current oxygen sat is 94%. Respirations regular and unlabored. Continuous pulse ox monitoring in use.

## 2021-03-01 NOTE — H&P (Signed)
History and Physical  [place] LGX:[id number] DOB: May 07, 1933 DOA: 03/01/2021  Referring physician: Dr. Marisa Severin, EDP PCP: Dale Davie, MD  Outpatient Specialists: Cardiology. Patient coming from: Home.  Chief Complaint: Shortness of breath and wheezing x1 day.  HPI: Tanya Cole is a 87 y.o. female with medical history significant for COPD, not on oxygen supplementation at baseline, chronic diastolic CHF, previous COVID-19 viral infection in March 2022, who presented from home with complaints of sudden onset shortness of breath and wheezing last night and worsened this morning.  She used her home nebulizers without any improvement.  Denies recent exposure to fumes, tobacco, chemicals or sick contacts.  No chest pain.  Minimal nonproductive cough.  She presented to the ED for further evaluation and management.  On chest x-ray she has evidence of calcified granuloma left base, personally reviewed.  She was started on oral steroids and duo nebs in the ED.  TRH, hospitalist team, was asked to admit.    ED Course:  Afebrile.  BP 124/68, pulse 86, respiratory 21, O2 saturation 96% on 2 L.  Lab studies essentially unremarkable except for WBC 11.8K.  Review of Systems: Review of systems as noted in the HPI. All other systems reviewed and are negative.   Past Medical History:  Diagnosis Date  . Aortic insufficiency    a. TTE 12/19: EF 55-60%, probable HK of the mid apical anterior septal myocardium, Gr1DD, mild AI, mildly dilated LA  . Asthma   . CAD (coronary artery disease)    a. NSTEMI 12/19; b. LHC 10/08/18: LM minimal luminal irregs, mLAD-1 95% s/p PCI/DES, mLAD-2 60%, LCx mild diffuse disease throughout, RCA minimal luminal irregs; b. 03/2020 MV: EF>65%, no ischemia/scar.  . CHF (congestive heart failure) (HCC)   . Diabetes mellitus (HCC)   . Hypercholesterolemia   . Hypertension   . Myocardial infarction (HCC)   . Osteopenia   . Palpitations    a. 04/2020 Zio: Avg HR  75. 429 SVT episodes, longest 19 secs @ 133. Occas PACs (3.2%). Rare PVCs (<1%).  . Polymyalgia rheumatica syndrome (HCC)   . Reactive airway disease    Past Surgical History:  Procedure Laterality Date  . ABDOMINAL HYSTERECTOMY  1981   prolapse and bleeding, ovaries not removed  . BREAST EXCISIONAL BIOPSY Right   . CHOLECYSTECTOMY N/A 09/02/2019   Procedure: LAPAROSCOPIC CHOLECYSTECTOMY WITH INTRAOPERATIVE CHOLANGIOGRAM;  Surgeon: Henrene Dodge, MD;  Location: [facility] ORS;  Service: General;  Laterality: N/A;  . CORONARY STENT INTERVENTION N/A 10/08/2018   Procedure: CORONARY STENT INTERVENTION;  Surgeon: Iran Ouch, MD;  Location: [facility];  Service: Cardiovascular;  Laterality: N/A;  . ENDOSCOPIC RETROGRADE CHOLANGIOPANCREATOGRAPHY (ERCP) WITH PROPOFOL N/A 08/08/2019   Procedure: ENDOSCOPIC RETROGRADE CHOLANGIOPANCREATOGRAPHY (ERCP) WITH PROPOFOL;  Surgeon: Midge Minium, MD;  Location: [facility] ENDOSCOPY;  Service: Endoscopy;  Laterality: N/A;  . LEFT HEART CATH AND CORONARY ANGIOGRAPHY N/A 10/08/2018   Procedure: LEFT HEART CATH AND CORONARY ANGIOGRAPHY;  Surgeon: Iran Ouch, MD;  Location: [facility];  Service: Cardiovascular;  Laterality: N/A;  . LEFT HEART CATH AND CORONARY ANGIOGRAPHY N/A 08/10/2020   Procedure: LEFT HEART CATH AND CORONARY ANGIOGRAPHY possible percutaneous intervention;  Surgeon: Iran Ouch, MD;  Location: [facility];  Service: Cardiovascular;  Laterality: N/A;  . UMBILICAL HERNIA REPAIR  7/94    Social History:  reports that she has never smoked. She has never used smokeless tobacco. She reports that she does not drink alcohol and does  not use drugs.   Allergies  Allergen Reactions  . Tramadol Itching and Nausea And Vomiting    Family History  Problem Relation Age of Onset  . Heart attack Father   . Arthritis Mother   . Heart disease Mother   . Throat cancer Sister   . Parkinson's disease Sister   . COPD Brother       Prior to Admission medications   Medication Sig Start Date End Date Taking? Authorizing Provider  ADVAIR DISKUS 250-50 MCG/DOSE AEPB INHALE ONE PUFF BY MOUTH EVERY 12 HOURS. RINSE MOUTH AFTER EACH USE Patient taking differently: Inhale 1 puff into the lungs 2 (two) times daily. 02/17/16   Dale [place], MD  albuterol (PROVENTIL) (2.5 MG/3ML) 0.083% nebulizer solution USE 1 VIAL IN NEBULIZER EVERY 4 HOURS AS NEEDED FOR WHEEZING FOR SHORTNESS OF BREATH 02/15/21   Dale [place], MD  alendronate (FOSAMAX) 70 MG tablet Take 70 mg by mouth once a week.  11/06/17   [provider]  aspirin 81 MG tablet Take 81 mg by mouth daily.    [provider]  atorvastatin (LIPITOR) 80 MG tablet Take 1 tablet (80 mg total) by mouth daily at 6 PM. 04/01/20   Alver Sorrow, NP  fluticasone (FLONASE) 50 MCG/ACT nasal spray Use 2 spray(s) in each nostril once daily Patient taking differently: Place 2 sprays into both nostrils daily. 08/03/20   Dale [place], MD  lisinopril (ZESTRIL) 20 MG tablet Take 1 tablet by mouth once daily Patient taking differently: Take 20 mg by mouth daily. 11/17/20   Dunn, Raymon Mutton, PA-C  metFORMIN (GLUCOPHAGE) 500 MG tablet TAKE 1 TABLET BY MOUTH TWICE DAILY WITH A MEAL Patient taking differently: Take 500 mg by mouth 2 (two) times daily with a meal. 09/29/20   Dale [place], MD  metoprolol tartrate (LOPRESSOR) 25 MG tablet Take 1 tablet by mouth twice daily Patient taking differently: Take 25 mg by mouth 2 (two) times daily. 12/21/20   Iran Ouch, MD  Molnupiravir 200 MG CAPS TAKE 4 CAPSULES BY MOUTH TWO TIMES DAILY FOR 5 DAYS 12/20/20 12/20/21  Janetta Hora, PA-C  Nebulizers (COMPRESSOR/NEBULIZER) MISC 1 Units by Does not apply route as directed. 04/07/20   Dionne Bucy, MD  nitroGLYCERIN (NITROSTAT) 0.4 MG SL tablet Place 1 tablet (0.4 mg total) under the tongue every 5 (five) minutes as needed for chest pain. 10/09/18   Adrian Saran, MD  PROAIR  HFA 108 (90 Base) MCG/ACT inhaler Inhale 2 puffs into the lungs every 4 (four) hours as needed for wheezing or shortness of breath. 08/20/20   Rolly Salter, MD  Vitamin D, Ergocalciferol, (DRISDOL) 50000 units CAPS capsule Take 50,000 Units by mouth once a week. 06/24/18   [provider]    Physical Exam: BP 123/66   Pulse 89   Temp 98.6 F (37 C)   Resp 17   Ht 5\' 1"  (1.549 m)   Wt 53.5 kg   SpO2 92%   BMI 22.30 kg/m   . General: 87 y.o. year-old female well developed well nourished in no acute distress.  Alert and oriented x3. . Cardiovascular: Regular rate and rhythm with no rubs or gallops.  No thyromegaly or JVD noted.  No lower extremity edema. 2/4 pulses in all 4 extremities. Marland Kitchen Respiratory: Diffuse wheezing bilaterally.  Mild rales noted left lower lobe. Good inspiratory effort. . Abdomen: Soft nontender nondistended with normal bowel sounds x4 quadrants. . Muskuloskeletal: No cyanosis, clubbing or edema noted bilaterally .  Neuro: CN II-XII intact, strength, sensation, reflexes . Skin: No ulcerative lesions noted or rashes . Psychiatry: Judgement and insight appear normal. Mood is appropriate for condition and setting          Labs on Admission:  Basic Metabolic Panel: Recent Labs  Lab 03/01/21 0905  NA 138  K 4.1  CL 103  CO2 26  GLUCOSE 149*  BUN 16  CREATININE 0.74  CALCIUM 9.7   Liver Function Tests: No results for input(s): AST, ALT, ALKPHOS, BILITOT, PROT, ALBUMIN in the last 168 hours. No results for input(s): LIPASE, AMYLASE in the last 168 hours. No results for input(s): AMMONIA in the last 168 hours. CBC: Recent Labs  Lab 03/01/21 0905  WBC 11.8*  HGB 13.7  HCT 41.7  MCV 87.4  PLT 268   Cardiac Enzymes: No results for input(s): CKTOTAL, CKMB, CKMBINDEX, TROPONINI in the last 168 hours.  BNP (last 3 results) Recent Labs    08/07/20 1135 10/08/20 2112 11/29/20 0951  BNP 259.6* 238.3* 67.5    ProBNP (last 3 results) No  results for input(s): PROBNP in the last 8760 hours.  CBG: No results for input(s): GLUCAP in the last 168 hours.  Radiological Exams on Admission: DG Chest 2 View  Result Date: 03/01/2021 CLINICAL DATA:  Shortness of breath EXAM: CHEST - 2 VIEW COMPARISON:  January 11, 2021 FINDINGS: There is a focal calcified granuloma in the left base. There is subsegmental atelectasis in the left base. Lungs elsewhere are clear. Heart size and pulmonary vascularity are normal. No adenopathy. No bone lesions. IMPRESSION: Left base atelectasis. Calcified granuloma left base. No edema or consolidation. Stable cardiac silhouette. Aortic Atherosclerosis (ICD10-I70.0). Electronically Signed   By: Bretta Bang III M.D.   On: 03/01/2021 09:33    EKG: I independently viewed the EKG done and my findings are as followed: Sinus rhythm rate of 88, nonspecific ST-T changes, QTC 445.  Assessment/Plan Present on Admission: . Acute exacerbation of chronic obstructive pulmonary disease (COPD) (HCC)  Active Problems:   Acute exacerbation of chronic obstructive pulmonary disease (COPD) (HCC)  Acute exacerbation of COPD, unknown trigger. No prior history of smoking however has lived with smokers for years. Denies any recent exposures to tobacco, fumes, chemicals, or sick contacts. Prior history of COVID-19 infection in March 2022. Denies prior diagnosis of asthma. Not on oxygen supplementation at baseline currently on 2 L to maintain O2 saturation greater than 92%, with saturation in the ED 91% on room air. Obtain acute respiratory viral test. Chest x-ray independently reviewed showing evidence of calcified granuloma left base. Received 1 dose of prednisone in the ED 60 mg x 1 and DuoNeb's x1 Ordered IV Solu-Medrol 125 mg, given x1 dose. Start prednisone 40 mg daily x5 days on 03/02/2021. Start Z-Pak x5 days on 03/01/2021 Continue scheduled bronchodilators. Pulmonary toilet Incentive spirometer Mobilize as  tolerated.  Leukocytosis Rule out active infective process. Presented with hemoglobin 11.8K Obtain urine analysis Treat if evidence of pyuria.  Calcified granuloma left base Recommend follow-up oupatient with repeated imaging within 90 days.  Type 2 diabetes with hyperglycemia Obtain hemoglobin A1c Hold off home oral hypoglycemics Start insulin sliding scale  Essential hypertension BP is currently not at goal Resume home oral antihypertensives. Continue to monitor vital signs. IV antihypertensive as needed with parameters  Hyperlipidemia Resume home regimen, Lipitor 80 mg daily.  Osteoporosis Resume home regimen after discharge.  Recent COVID-19 infection Last positive COVID-19 screening test on 01/11/2021.   DVT prophylaxis: Subcu Lovenox  daily.    Code Status: DNR stated by the patient herself.  Daughter at bedside.  Family Communication: Plan discussed with the patient and her daughter at bedside.  They both understand and agree with the plan.  Disposition Plan: MedSurg unit with remote telemetry.  Consults called: None.  Admission status: Observation status.   Status is: Observation    Dispo:  Patient From: Home  Planned Disposition: Home, possibly on 03/02/2021 or when symptomatology has improved.  Medically stable for discharge: No      Darlin Drop MD [facility] Pager [phone]  If 7PM-7AM, please contact night-coverage [email or web address] Password [facility]  03/01/2021, 2:29 PM

## 2021-03-01 NOTE — ED Provider Notes (Signed)
[facility] Provider Note ____________________________________________   Event Date/Time   First MD Initiated Contact with Patient 03/01/21 1050     (approximate)  I have reviewed the triage vital signs and the nursing notes.   HISTORY  Chief Complaint Shortness of Breath    HPI Tanya Cole is a 87 y.o. female with PMH as noted below including CHF, CAD, CHF, diabetes who presents with shortness of breath acute onset when she awoke this morning.  This was associated with chest tightness and wheezing.  The patient denies any cough or fever.  She states that she was using a nebulizer treatment last night after having some wheezing as well.  She denies any vomiting or diarrhea.  There is no leg swelling.  She has no sick contacts or other recent illness.  She tested positive for COVID in February and March.   Past Medical History:  Diagnosis Date  . Aortic insufficiency    a. TTE 12/19: EF 55-60%, probable HK of the mid apical anterior septal myocardium, Gr1DD, mild AI, mildly dilated LA  . Asthma   . CAD (coronary artery disease)    a. NSTEMI 12/19; b. LHC 10/08/18: LM minimal luminal irregs, mLAD-1 95% s/p PCI/DES, mLAD-2 60%, LCx mild diffuse disease throughout, RCA minimal luminal irregs; b. 03/2020 MV: EF>65%, no ischemia/scar.  . CHF (congestive heart failure) (HCC)   . Diabetes mellitus (HCC)   . Hypercholesterolemia   . Hypertension   . Myocardial infarction (HCC)   . Osteopenia   . Palpitations    a. 04/2020 Zio: Avg HR 75. 429 SVT episodes, longest 19 secs @ 133. Occas PACs (3.2%). Rare PVCs (<1%).  . Polymyalgia rheumatica syndrome (HCC)   . Reactive airway disease     Patient Active Problem List   Diagnosis Date Noted  . Acute exacerbation of chronic obstructive pulmonary disease (COPD) (HCC) 03/01/2021  . COPD exacerbation (HCC) 11/29/2020  . Chronic diastolic CHF (congestive heart failure) (HCC) 11/29/2020  .  CAP (community acquired pneumonia) 11/29/2020  . Rib pain on right side 10/19/2020  . Abnormal CXR 10/19/2020  . Type 2 diabetes mellitus without complication (HCC) 10/08/2020  . Elevated troponin 10/08/2020  . Acute on chronic heart failure with preserved ejection fraction (HFpEF) (HCC) 10/08/2020  . COPD with acute exacerbation (HCC) 08/19/2020  . History of non-ST elevation myocardial infarction (NSTEMI) 08/07/2020  . Asthma 08/07/2020  . Leukocytosis 08/07/2020  . Left shoulder pain 07/12/2020  . Iron deficiency 04/16/2020  . Anemia 04/04/2020  . SOB (shortness of breath) 03/25/2020  . Cough 09/30/2019  . Gallstone pancreatitis   . Pre-op evaluation 08/30/2019  . Cholecystitis 08/05/2019  . Choledocholithiasis   . Respiratory illness 06/18/2019  . 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) 12/29/2018  . Chest pain 10/07/2018  . Memory change 05/27/2018  . Osteoporosis 02/05/2018  . Weight loss 06/24/2017  . Abdominal pain, left lower quadrant 10/05/2016  . Long term current use of systemic steroids 05/16/2016  . Elevated erythrocyte sedimentation rate 05/04/2016  . Back pain 04/29/2016  . Fatigue 05/24/2015  . Health care maintenance 01/25/2015  . UTI (urinary tract infection) 07/07/2014  . Neuropathy 03/24/2014  . Diverticulitis 02/24/2013  . Reactive airway disease 09/15/2012  . Hypertension 09/14/2012  . Hypercholesterolemia 09/14/2012  . Diabetes mellitus with cardiac complication (HCC) 09/14/2012    Past Surgical History:  Procedure Laterality Date  . ABDOMINAL HYSTERECTOMY  1981   prolapse and bleeding,  ovaries not removed  . BREAST EXCISIONAL BIOPSY Right   . CHOLECYSTECTOMY N/A 09/02/2019   Procedure: LAPAROSCOPIC CHOLECYSTECTOMY WITH INTRAOPERATIVE CHOLANGIOGRAM;  Surgeon: Henrene Dodge, MD;  Location: [facility] ORS;  Service: General;  Laterality: N/A;  . CORONARY STENT INTERVENTION N/A 10/08/2018   Procedure: CORONARY STENT  INTERVENTION;  Surgeon: Iran Ouch, MD;  Location: [facility];  Service: Cardiovascular;  Laterality: N/A;  . ENDOSCOPIC RETROGRADE CHOLANGIOPANCREATOGRAPHY (ERCP) WITH PROPOFOL N/A 08/08/2019   Procedure: ENDOSCOPIC RETROGRADE CHOLANGIOPANCREATOGRAPHY (ERCP) WITH PROPOFOL;  Surgeon: Midge Minium, MD;  Location: [facility] END[facility];  Service: Endoscopy;  Laterality: N/A;  . LEFT HEART CATH AND CORONARY ANGIOGRAPHY N/A 10/08/2018   Procedure: LEFT HEART CATH AND CORONARY ANGIOGRAPHY;  Surgeon: Iran Ouch, MD;  Location: [facility];  Service: Cardiovascular;  Laterality: N/A;  . LEFT HEART CATH AND CORONARY ANGIOGRAPHY N/A 08/10/2020   Procedure: LEFT HEART CATH AND CORONARY ANGIOGRAPHY possible percutaneous intervention;  Surgeon: Iran Ouch, MD;  Location: [facility];  Service: Cardiovascular;  Laterality: N/A;  . UMBILICAL HERNIA REPAIR  7/94    Prior to Admission medications   Medication Sig Start Date End Date Taking? Authorizing Provider  ADVAIR DISKUS 250-50 MCG/DOSE AEPB INHALE ONE PUFF BY MOUTH EVERY 12 HOURS. RINSE MOUTH AFTER EACH USE Patient taking differently: Inhale 1 puff into the lungs 2 (two) times daily. 02/17/16  Yes Dale [place], MD  albuterol (PROVENTIL) (2.5 MG/3ML) 0.083% nebulizer solution USE 1 VIAL IN NEBULIZER EVERY 4 HOURS AS NEEDED FOR WHEEZING FOR SHORTNESS OF BREATH 02/15/21  Yes Dale [place], MD  aspirin 81 MG tablet Take 81 mg by mouth daily.   Yes [provider]  atorvastatin (LIPITOR) 80 MG tablet Take 1 tablet (80 mg total) by mouth daily at 6 PM. 04/01/20  Yes Alver Sorrow, NP  fluticasone (FLONASE) 50 MCG/ACT nasal spray Use 2 spray(s) in each nostril once daily Patient taking differently: Place 2 sprays into both nostrils daily. 08/03/20  Yes Dale [place], MD  lisinopril (ZESTRIL) 20 MG tablet Take 1 tablet by mouth once daily Patient taking differently: Take 20 mg by mouth daily. 11/17/20  Yes  Dunn, Raymon Mutton, PA-C  metFORMIN (GLUCOPHAGE) 500 MG tablet TAKE 1 TABLET BY MOUTH TWICE DAILY WITH A MEAL Patient taking differently: Take 500 mg by mouth 2 (two) times daily with a meal. 09/29/20  Yes Dale [place], MD  metoprolol tartrate (LOPRESSOR) 25 MG tablet Take 1 tablet by mouth twice daily Patient taking differently: Take 25 mg by mouth 2 (two) times daily. 12/21/20  Yes Iran Ouch, MD  PROAIR HFA 108 [phone] Base) MCG/ACT inhaler Inhale 2 puffs into the lungs every 4 (four) hours as needed for wheezing or shortness of breath. 08/20/20  Yes Rolly Salter, MD  alendronate (FOSAMAX) 70 MG tablet Take 70 mg by mouth once a week.  11/06/17   [provider]  Molnupiravir 200 MG CAPS TAKE 4 CAPSULES BY MOUTH TWO TIMES DAILY FOR 5 DAYS Patient not taking: Reported on 03/01/2021 12/20/20 12/20/21  Janetta Hora, PA-C  Nebulizers (COMPRESSOR/NEBULIZER) MISC 1 Units by Does not apply route as directed. 04/07/20   Dionne Bucy, MD  nitroGLYCERIN (NITROSTAT) 0.4 MG SL tablet Place 1 tablet (0.4 mg total) under the tongue every 5 (five) minutes as needed for chest pain. 10/09/18   Adrian Saran, MD  Vitamin D, Ergocalciferol, (DRISDOL) 50000 units CAPS capsule Take 50,000 Units by mouth once a week. Patient not taking:  Reported on 03/01/2021 06/24/18   [provider]    Allergies Tramadol  Family History  Problem Relation Age of Onset  . Heart attack Father   . Arthritis Mother   . Heart disease Mother   . Throat cancer Sister   . Parkinson's disease Sister   . COPD Brother     Social History Social History   Tobacco Use  . Smoking status: Never Smoker  . Smokeless tobacco: Never Used  Vaping Use  . Vaping Use: Never used  Substance Use Topics  . Alcohol use: No    Alcohol/week: 0.0 standard drinks  . Drug use: No    Review of Systems  Constitutional: No fever. Eyes: No redness. ENT: No sore throat. Cardiovascular: Denies chest pain. Respiratory:  Positive for shortness of breath. Gastrointestinal: No vomiting or diarrhea.  Genitourinary: Negative for dysuria.  Musculoskeletal: Negative for back pain. Skin: Negative for rash. Neurological: Negative for headache.   ____________________________________________   PHYSICAL EXAM:  VITAL SIGNS: ED Triage Vitals  Enc Vitals Group     BP 03/01/21 0859 (!) 154/91     Pulse Rate 03/01/21 0859 81     Resp 03/01/21 0859 18     Temp 03/01/21 0859 98.6 F (37 C)     Temp src --      SpO2 03/01/21 0859 91 %     Weight 03/01/21 0902 118 lb (53.5 kg)     Height 03/01/21 0902  (1.549 m)     Head Circumference --      Peak Flow --      Pain Score 03/01/21 0902 5     Pain Loc --      Pain Edu? --      Excl. in GC? --     Constitutional: Alert and oriented.  Relatively well appearing and in no acute distress. Eyes: Conjunctivae are normal.  Head: Atraumatic. Nose: No congestion/rhinnorhea. Mouth/Throat: Mucous membranes are moist.   Neck: Normal range of motion.  Cardiovascular: Normal rate, regular rhythm. Grossly normal heart sounds.  Good peripheral circulation. Respiratory: Some current increased work of breathing with no accessory muscle use.  Wheezing bilaterally. Gastrointestinal: Soft and nontender. No distention.  Genitourinary: No flank tenderness. Musculoskeletal: No lower extremity edema.  Extremities warm and well perfused.  Neurologic:  Normal speech and language. No gross focal neurologic deficits are appreciated.  Skin:  Skin is warm and dry. No rash noted. Psychiatric: Mood and affect are normal. Speech and behavior are normal.  ____________________________________________   LABS (all labs ordered are listed, but only abnormal results are displayed)  Labs Reviewed  BASIC METABOLIC PANEL - Abnormal; Notable for the following components:      Result Value   Glucose, Bld 149 (*)    All other components within normal limits  CBC - Abnormal; Notable for  the following components:   WBC 11.8 (*)    All other components within normal limits  RESPIRATORY PANEL BY PCR  URINE CULTURE  HEMOGLOBIN A1C  HIV ANTIBODY (ROUTINE TESTING W REFLEX)  URINALYSIS, COMPLETE (UACMP) WITH MICROSCOPIC  TROPONIN I (HIGH SENSITIVITY)  TROPONIN I (HIGH SENSITIVITY)   ____________________________________________  EKG  ED ECG REPORT I, Dionne Bucy, the attending physician, personally viewed and interpreted this ECG.  Date: 03/01/2021 EKG Time: 0858 Rate: 88 Rhythm: normal sinus rhythm QRS Axis: normal Intervals: normal ST/T Wave abnormalities: Nonspecific ST abnormalities Narrative Interpretation: Nonspecific abnormalities with no evidence of acute ischemia  ____________________________________________  RADIOLOGY  Chest  x-ray interpreted by me shows left base atelectasis with no focal consolidation or edema  ____________________________________________   PROCEDURES  Procedure(s) performed: No  Procedures  Critical Care performed: No ____________________________________________   INITIAL IMPRESSION / ASSESSMENT AND PLAN / ED COURSE  Pertinent labs & imaging results that were available during my care of the patient were reviewed by me and considered in my medical decision making (see chart for details).  87 year old female with PMH as noted above including COPD, CAD, CHF, diabetes presents with relatively acute onset of shortness of breath since this morning with no associated cough or fever.  It feels to her like a COPD exacerbation and was not relieved by her nebulizer at home.  I reviewed the past medical records in Epic.  The patient tested positive for COVID in 01/12/2023.  She was most recently seen in the ED for a COPD exacerbation on 3/14 but was able to go home.  On exam today the patient is overall relatively well-appearing.  O2 saturation is in the low 90s on room air with otherwise normal vital signs.  The patient does have  slightly increased work of breathing and lung exam reveals diffuse wheezing bilaterally, however she is not in any acute respiratory distress.  There is no leg swelling or other peripheral edema.  Overall presentation is most consistent with COPD exacerbation.  Differential includes acute bronchitis, pneumonia, or less likely CHF.  There is no evidence of ACS.  We will obtain a chest x-ray, basic labs, troponins x2, give duo nebs and steroid and reassess.  ----------------------------------------- 2:20 PM on 03/01/2021 -----------------------------------------  On reassessment, the patient still appears relatively comfortable but is tachypneic.  O2 saturation was 88 to 89% on room air.  Given the persistent hypoxia, will admit for further management.  I placed the patient on 2 L of O2 by nasal cannula.  I consulted Dr. Margo Aye from the hospitalist service for admission.  ____________________________________________   FINAL CLINICAL IMPRESSION(S) / ED DIAGNOSES  Final diagnoses:  COPD exacerbation (HCC)      NEW MEDICATIONS STARTED DURING THIS VISIT:  New Prescriptions   No medications on file     Note:  This document was prepared using Dragon voice recognition software and may include unintentional dictation errors.   Dionne Bucy, MD 03/01/21 [phone]

## 2021-03-01 NOTE — ED Notes (Signed)
RN informed of bed assigned 

## 2021-03-02 ENCOUNTER — Encounter: Payer: Self-pay | Admitting: Internal Medicine

## 2021-03-02 DIAGNOSIS — J441 Chronic obstructive pulmonary disease with (acute) exacerbation: Secondary | ICD-10-CM | POA: Diagnosis not present

## 2021-03-02 DIAGNOSIS — J8410 Pulmonary fibrosis, unspecified: Secondary | ICD-10-CM | POA: Insufficient documentation

## 2021-03-02 LAB — BASIC METABOLIC PANEL
Anion gap: 10 (ref 5–15)
BUN: 26 mg/dL — ABNORMAL HIGH (ref 8–23)
CO2: 23 mmol/L (ref 22–32)
Calcium: 9 mg/dL (ref 8.9–10.3)
Chloride: 105 mmol/L (ref 98–111)
Creatinine, Ser: 0.74 mg/dL (ref 0.44–1.00)
GFR, Estimated: >60 mL/min (ref >60)
Glucose, Bld: 198 mg/dL — ABNORMAL HIGH (ref 70–99)
Potassium: 4 mmol/L (ref 3.5–5.1)
Sodium: 138 mmol/L (ref 135–145)

## 2021-03-02 LAB — CBC
HCT: 37.3 % (ref 36.0–46.0)
Hemoglobin: 12.7 g/dL (ref 12.0–15.0)
MCH: 29 pg (ref 26.0–34.0)
MCHC: 34 g/dL (ref 30.0–36.0)
MCV: 85.2 fL (ref 80.0–100.0)
Platelets: 252 10*3/uL (ref 150–400)
RBC: 4.38 MIL/uL (ref 3.87–5.11)
RDW: 13.6 % (ref 11.5–15.5)
WBC: 15.8 10*3/uL — ABNORMAL HIGH (ref 4.0–10.5)
nRBC: 0 % (ref 0.0–0.2)

## 2021-03-02 LAB — RESPIRATORY PANEL BY PCR

## 2021-03-02 LAB — HIV ANTIBODY (ROUTINE TESTING W REFLEX): HIV Screen 4th Generation wRfx: NONREACTIVE

## 2021-03-02 LAB — MAGNESIUM: Magnesium: 1.6 mg/dL — ABNORMAL LOW (ref 1.7–2.4)

## 2021-03-02 LAB — GLUCOSE, CAPILLARY: Glucose-Capillary: 171 mg/dL — ABNORMAL HIGH (ref 70–99)

## 2021-03-02 LAB — PHOSPHORUS: Phosphorus: 3.8 mg/dL (ref 2.5–4.6)

## 2021-03-02 MED ORDER — PREDNISONE 20 MG PO TABS: 40.0000 mg | ORAL_TABLET | Freq: Every day | ORAL | 0 refills | Status: AC

## 2021-03-02 MED ORDER — IPRATROPIUM-ALBUTEROL 0.5-2.5 (3) MG/3ML IN SOLN
3.0000 mL | Freq: Three times a day (TID) | RESPIRATORY_TRACT | Status: DC
  Administered 2021-03-02: 3 mL via RESPIRATORY_TRACT
  Filled 2021-03-02: qty 3

## 2021-03-02 NOTE — Progress Notes (Signed)
SATURATION QUALIFICATIONS: (This note is used to comply with regulatory documentation for home oxygen)  Patient Saturations on Room Air at Rest = 97%  Patient Saturations on Room Air while Ambulating = 94%  Patient Saturations on 0 Liters of oxygen while Ambulating = 94%  Madie Reno, RN

## 2021-03-02 NOTE — Progress Notes (Signed)
[facility] Room 203 [facility] Hospital Liaison RN note:  Received new referral for [facility] out patient palliative program to follow post discharge from Dr. Allena Katz. Charlynn Court, TOC aware. Referral has patient information and [facility] Liaison will follow for disposition.  Thank you.  Cyndra Numbers, RN [facility] Liaison  [phone]

## 2021-03-02 NOTE — Progress Notes (Signed)
Tanya Cole to be D/C'd Home per MD order.  Discussed prescriptions and follow up appointments with the patient. Prescriptions given to patient, medication list explained in detail. Pt verbalized understanding.  Allergies as of 03/02/2021      Reactions   Tramadol Itching, Nausea And Vomiting      Medication List    STOP taking these medications   Compressor/Nebulizer Misc   Molnupiravir 200 MG Caps     TAKE these medications   Advair Diskus 250-50 MCG/ACT Aepb Generic drug: fluticasone-salmeterol INHALE ONE PUFF BY MOUTH EVERY 12 HOURS. RINSE MOUTH AFTER EACH USE What changed: See the new instructions.   alendronate 70 MG tablet Commonly known as: FOSAMAX Take 70 mg by mouth once a week.   aspirin 81 MG tablet Take 81 mg by mouth daily.   atorvastatin 80 MG tablet Commonly known as: LIPITOR Take 1 tablet (80 mg total) by mouth daily at 6 PM.   fluticasone 50 MCG/ACT nasal spray Commonly known as: FLONASE Use 2 spray(s) in each nostril once daily What changed: See the new instructions.   lisinopril 20 MG tablet Commonly known as: ZESTRIL Take 1 tablet by mouth once daily   metFORMIN 500 MG tablet Commonly known as: GLUCOPHAGE TAKE 1 TABLET BY MOUTH TWICE DAILY WITH A MEAL   metoprolol tartrate 25 MG tablet Commonly known as: LOPRESSOR Take 1 tablet by mouth twice daily   nitroGLYCERIN 0.4 MG SL tablet Commonly known as: NITROSTAT Place 1 tablet (0.4 mg total) under the tongue every 5 (five) minutes as needed for chest pain.   predniSONE 20 MG tablet Commonly known as: DELTASONE Take 2 tablets (40 mg total) by mouth daily with breakfast for 5 days.   ProAir HFA 108 (90 Base) MCG/ACT inhaler Generic drug: albuterol Inhale 2 puffs into the lungs every 4 (four) hours as needed for wheezing or shortness of breath.   albuterol (2.5 MG/3ML) 0.083% nebulizer solution Commonly known as: PROVENTIL USE 1 VIAL IN NEBULIZER EVERY 4 HOURS AS NEEDED FOR WHEEZING  FOR SHORTNESS OF BREATH   Vitamin D (Ergocalciferol) 1.25 MG (50000 UNIT) Caps capsule Commonly known as: DRISDOL Take 50,000 Units by mouth once a week.       Vitals:   03/02/21 0835 03/02/21 0836  BP: 101/63   Pulse: 70   Resp: 20   Temp: 98.3 F (36.8 C)   SpO2: 96% 97%    Skin clean, dry and intact without evidence of skin break down, no evidence of skin tears noted. IV catheter discontinued intact. Site without signs and symptoms of complications. Dressing and pressure applied. Pt denies pain at this time. No complaints noted.  An After Visit Summary was printed and given to the patient. Patient escorted via WC, and D/C home via private auto.  Tanya Reno, RN

## 2021-03-02 NOTE — Discharge Instructions (Signed)
Use your inhalers and nebulizer as before °

## 2021-03-02 NOTE — Progress Notes (Signed)
OT Cancellation Note  Patient Details Name: Tanya Cole MRN: [id number] DOB: 26-Jul-1933   Cancelled Treatment:    Reason Eval/Treat Not Completed: OT screened, no needs identified, will sign off. Pt ambulating and performing self care tasks with use of RW. Per RN, pt does not need OT intervention. OT to SIGN OFF.  Jackquline Denmark, MS, OTR/L , CBIS ascom [phone]  03/02/21, 10:14 AM   03/02/2021, 10:14 AM

## 2021-03-02 NOTE — Discharge Summary (Signed)
[facility] [place] [facility]   PATIENT NAME: Tanya Cole    MR#:  [id number]  DATE OF BIRTH:  10-10-1933  DATE OF ADMISSION:  03/01/2021 ADMITTING PHYSICIAN: Darlin Drop, DO  DATE OF DISCHARGE: 03/02/2021  PRIMARY CARE PHYSICIAN: Dale Herron, MD    ADMISSION DIAGNOSIS:  Acute exacerbation of chronic obstructive pulmonary disease (COPD) (HCC) [J44.1] COPD exacerbation (HCC) [J44.1]  DISCHARGE DIAGNOSIS:  COPD with acute on chronic exacerbation  SECONDARY DIAGNOSIS:   Past Medical History:  Diagnosis Date  . Aortic insufficiency    a. TTE 12/19: EF 55-60%, probable HK of the mid apical anterior septal myocardium, Gr1DD, mild AI, mildly dilated LA  . Asthma   . CAD (coronary artery disease)    a. NSTEMI 12/19; b. LHC 10/08/18: LM minimal luminal irregs, mLAD-1 95% s/p PCI/DES, mLAD-2 60%, LCx mild diffuse disease throughout, RCA minimal luminal irregs; b. 03/2020 MV: EF>65%, no ischemia/scar.  . CHF (congestive heart failure) (HCC)   . Diabetes mellitus (HCC)   . Hypercholesterolemia   . Hypertension   . Myocardial infarction (HCC)   . Osteopenia   . Palpitations    a. 04/2020 Zio: Avg HR 75. 429 SVT episodes, longest 19 secs @ 133. Occas PACs (3.2%). Rare PVCs (<1%).  . Polymyalgia rheumatica syndrome (HCC)   . Reactive airway disease     HOSPITAL COURSE:  Tanya Cole is a 87 y.o. female with medical history significant for COPD, not on oxygen supplementation at baseline, chronic diastolic CHF, previous COVID-19 viral infection in March 2022, who presented from home with complaints of sudden onset shortness of breath and wheezing last night and worsened this morning.  She used her home nebulizers without any improvement.  Acute exacerbation of COPD, unknown trigger. --No prior history of smoking however has lived with smokers for years and worked in [employer] for 29 years --Prior history of COVID-19 infection in March 2022. --Not on  oxygen supplementation at baseline-- currently wean to room air. Sats are 94% on ambulation 97% on room air. --Chest x-ray independently reviewed showing evidence of calcified granuloma left base. --Continue scheduled bronchodilators. --Pulmonary toilet --Incentive spirometer -- change to PO steroids. Patient is not have indication for antibiotics discontinued.  Leukocytosis Presented with WBC 11.8K-- appears reactive and could be due to steroids denies any fever or dysuria.  Calcified granuloma left base Recommend follow-up oupatient with repeated imaging within 90 days.  Type 2 diabetes with hyperglycemia hemoglobin A1c .2 continue sliding scale while in house and resume metformin at discharge  Essential hypertension Resume home oral antihypertensives.  Hyperlipidemia Resume home regimen, Lipitor 80 mg daily.  Osteoporosis continue Fosamax  Recent COVID-19 infection Last positive COVID-19 screening test on 01/11/2021.   DVT prophylaxis: Subcu Lovenox daily.    Code Status: DNR stated by the patient herself.   Family Communication: none today.  Disposition Plan:  back to home Consults called: None.  Admission status: Observation status.  Patient has clinically improved and is at baseline. Will discharge her to home with outpatient follow-up with Dr. Dale [place] PCP.  CONSULTS OBTAINED:    DRUG ALLERGIES:   Allergies  Allergen Reactions  . Tramadol Itching and Nausea And Vomiting    DISCHARGE MEDICATIONS:   Allergies as of 03/02/2021      Reactions   Tramadol Itching, Nausea And Vomiting      Medication List    STOP taking these medications   Molnupiravir 200 MG Caps  TAKE these medications   Advair Diskus 250-50 MCG/ACT Aepb Generic drug: fluticasone-salmeterol INHALE ONE PUFF BY MOUTH EVERY 12 HOURS. RINSE MOUTH AFTER EACH USE What changed: See the new instructions.   alendronate 70 MG tablet Commonly known as: FOSAMAX Take 70  mg by mouth once a week.   aspirin 81 MG tablet Take 81 mg by mouth daily.   atorvastatin 80 MG tablet Commonly known as: LIPITOR Take 1 tablet (80 mg total) by mouth daily at 6 PM.   Compressor/Nebulizer Misc 1 Units by Does not apply route as directed.   fluticasone 50 MCG/ACT nasal spray Commonly known as: FLONASE Use 2 spray(s) in each nostril once daily What changed: See the new instructions.   lisinopril 20 MG tablet Commonly known as: ZESTRIL Take 1 tablet by mouth once daily   metFORMIN 500 MG tablet Commonly known as: GLUCOPHAGE TAKE 1 TABLET BY MOUTH TWICE DAILY WITH A MEAL   metoprolol tartrate 25 MG tablet Commonly known as: LOPRESSOR Take 1 tablet by mouth twice daily   nitroGLYCERIN 0.4 MG SL tablet Commonly known as: NITROSTAT Place 1 tablet (0.4 mg total) under the tongue every 5 (five) minutes as needed for chest pain.   predniSONE 20 MG tablet Commonly known as: DELTASONE Take 2 tablets (40 mg total) by mouth daily with breakfast for 5 days.   ProAir HFA 108 (90 Base) MCG/ACT inhaler Generic drug: albuterol Inhale 2 puffs into the lungs every 4 (four) hours as needed for wheezing or shortness of breath.   albuterol (2.5 MG/3ML) 0.083% nebulizer solution Commonly known as: PROVENTIL USE 1 VIAL IN NEBULIZER EVERY 4 HOURS AS NEEDED FOR WHEEZING FOR SHORTNESS OF BREATH   Vitamin D (Ergocalciferol) 1.25 MG (50000 UNIT) Caps capsule Commonly known as: DRISDOL Take 50,000 Units by mouth once a week.       If you experience worsening of your admission symptoms, develop shortness of breath, life threatening emergency, suicidal or homicidal thoughts you must seek medical attention immediately by calling 911 or calling your MD immediately  if symptoms less severe.  You Must read complete instructions/literature along with all the possible adverse reactions/side effects for all the Medicines you take and that have been prescribed to you. Take any new  Medicines after you have completely understood and accept all the possible adverse reactions/side effects.   Please note  You were cared for by a hospitalist during your hospital stay. If you have any questions about your discharge medications or the care you received while you were in the hospital after you are discharged, you can call the unit and asked to speak with the hospitalist on call if the hospitalist that took care of you is not available. Once you are discharged, your primary care physician will handle any further medical issues. Please note that NO REFILLS for any discharge medications will be authorized once you are discharged, as it is imperative that you return to your primary care physician (or establish a relationship with a primary care physician if you do not have one) for your aftercare needs so that they can reassess your need for medications and monitor your lab values. Today   SUBJECTIVE   I feel a lot better. No wheezing. Ambulated earlier with RN. Maintain sats above 92% on ambulation. No respiratory distress  VITAL SIGNS:  Blood pressure 101/63, pulse 70, temperature 98.3 F (36.8 C), temperature source Oral, resp. rate 20, height  (1.549 m), weight 53.5 kg, SpO2 97 %.  I/O:  Intake/Output Summary (Last 24 hours) at 03/02/2021 0854 Last data filed at 03/02/2021 1941 Gross per 24 hour  Intake 495.28 ml  Output 0 ml  Net 495.28 ml    PHYSICAL EXAMINATION:  GENERAL:  87 y.o.-year-old patient lying in the bed with no acute distress.  LUNGS: Normal breath sounds bilaterally, no wheezing, rales,rhonchi or crepitation. No use of accessory muscles of respiration.  CARDIOVASCULAR: S1, S2 normal. No murmurs, rubs, or gallops.  ABDOMEN: Soft, non-tender, non-distended. Bowel sounds present. No organomegaly or mass.  EXTREMITIES: No pedal edema, cyanosis, or clubbing.  NEUROLOGIC: Cranial nerves II through XII are intact. Muscle strength 5/5 in all extremities.  Sensation intact. Gait not checked.  PSYCHIATRIC: The patient is alert and oriented x 3.  SKIN: No obvious rash, lesion, or ulcer.   DATA REVIEW:   CBC  Recent Labs  Lab 03/02/21 0646  WBC 15.8*  HGB 12.7  HCT 37.3  PLT 252    Chemistries  Recent Labs  Lab 03/02/21 0646  NA 138  K 4.0  CL 105  CO2 23  GLUCOSE 198*  BUN 26*  CREATININE 0.74  CALCIUM 9.0  MG 1.6*    Microbiology Results   Recent Results (from the past 240 hour(s))  Respiratory (~20 pathogens) panel by PCR     Status: None   Collection Time: 03/02/21 12:05 AM   Specimen: Nasopharyngeal Swab; Respiratory  Result Value Ref Range Status   Adenovirus NOT DETECTED NOT DETECTED Final   Coronavirus 229E NOT DETECTED NOT DETECTED Final    Comment: (NOTE) The Coronavirus on the Respiratory Panel, DOES NOT test for the novel  Coronavirus (2019 nCoV)    Coronavirus HKU1 NOT DETECTED NOT DETECTED Final   Coronavirus NL63 NOT DETECTED NOT DETECTED Final   Coronavirus OC43 NOT DETECTED NOT DETECTED Final   Metapneumovirus NOT DETECTED NOT DETECTED Final   Rhinovirus / Enterovirus NOT DETECTED NOT DETECTED Final   Influenza A NOT DETECTED NOT DETECTED Final   Influenza B NOT DETECTED NOT DETECTED Final   Parainfluenza Virus 1 NOT DETECTED NOT DETECTED Final   Parainfluenza Virus 2 NOT DETECTED NOT DETECTED Final   Parainfluenza Virus 3 NOT DETECTED NOT DETECTED Final   Parainfluenza Virus 4 NOT DETECTED NOT DETECTED Final   Respiratory Syncytial Virus NOT DETECTED NOT DETECTED Final   Bordetella pertussis NOT DETECTED NOT DETECTED Final   Bordetella Parapertussis NOT DETECTED NOT DETECTED Final   Chlamydophila pneumoniae NOT DETECTED NOT DETECTED Final   Mycoplasma pneumoniae NOT DETECTED NOT DETECTED Final    Comment: Performed at [facility], [address], [place], [place] [address]    RADIOLOGY:  DG Chest 2 View  Result Date: 03/01/2021 CLINICAL DATA:  Shortness of breath EXAM: CHEST - 2  VIEW COMPARISON:  January 11, 2021 FINDINGS: There is a focal calcified granuloma in the left base. There is subsegmental atelectasis in the left base. Lungs elsewhere are clear. Heart size and pulmonary vascularity are normal. No adenopathy. No bone lesions. IMPRESSION: Left base atelectasis. Calcified granuloma left base. No edema or consolidation. Stable cardiac silhouette. Aortic Atherosclerosis (ICD10-I70.0). Electronically Signed   By: Bretta Bang III M.D.   On: 03/01/2021 09:33     CODE STATUS:     Code Status Orders  (From admission, onward)         Start     Ordered   03/01/21 1425  Do not attempt resuscitation (DNR)  Continuous       Question Answer  Comment  In the event of cardiac or respiratory ARREST Do not call a "code blue"   In the event of cardiac or respiratory ARREST Do not perform Intubation, CPR, defibrillation or ACLS   In the event of cardiac or respiratory ARREST Use medication by any route, position, wound care, and other measures to relive pain and suffering. May use oxygen, suction and manual treatment of airway obstruction as needed for comfort.      03/01/21 1424        Code Status History    Date Active Date Inactive Code Status Order ID Comments User Context   11/29/2020 1259 11/30/2020 2111 DNR [id number]  Lorretta Harp, MD ED   11/29/2020 1214 11/29/2020 1259 DNR [id number]  Lorretta Harp, MD ED   10/08/2020 2313 10/11/2020 2306 DNR [id number]  Andris Baumann, MD ED   08/19/2020 2258 08/20/2020 2254 DNR [id number]  Andris Baumann, MD ED   08/07/2020 2021 08/10/2020 2117 DNR [id number]  Lorretta Harp, MD ED   08/07/2020 1828 08/07/2020 2021 DNR [id number]  Lorretta Harp, MD ED   08/05/2019 0221 08/09/2019 1617 DNR [id number]  Arnaldo Natal, MD Inpatient   12/14/2018 2113 12/15/2018 1727 DNR [id number]  Barbaraann Rondo, MD Inpatient   12/14/2018 1123 12/14/2018 2113 Full Code [id number]  Milagros Loll, MD ED   10/07/2018 1818 10/09/2018 1637 DNR [id number]  Bertrum Sol, MD ED   10/07/2018 1818 10/07/2018 1818 Full Code [id number]  Salary, Evelena Asa, MD ED   Advance Care Planning Activity    Advance Directive Documentation   Flowsheet Row Most Recent Value  Type of Advance Directive Living will, Healthcare Power of Attorney, Out of facility DNR (pink MOST or yellow form)  Pre-existing out of facility DNR order (yellow form or pink MOST form) --  "MOST" Form in Place? --       TOTAL TIME TAKING CARE OF THIS PATIENT: *40* minutes.    Enedina Finner M.D  [facility]    CC: Primary care physician; Dale Rodanthe, MD

## 2021-03-02 NOTE — Progress Notes (Signed)
PT Cancellation Note  Patient Details Name: Letzy Gullickson MRN: [id number] DOB: [birth year]   Cancelled Treatment:    Reason Eval/Treat Not Completed: PT screened, no needs identified, will sign off. Patient ambulated with RN earlier, RN states she does not need PT eval and is being discharged. Will sign off.    Amiel Sharrow 03/02/2021, 9:06 AM

## 2021-03-04 ENCOUNTER — Ambulatory Visit (INDEPENDENT_AMBULATORY_CARE_PROVIDER_SITE_OTHER): Payer: Medicare HMO | Admitting: Internal Medicine

## 2021-03-04 DIAGNOSIS — E7800 Pure hypercholesterolemia, unspecified: Secondary | ICD-10-CM

## 2021-03-04 DIAGNOSIS — G629 Polyneuropathy, unspecified: Secondary | ICD-10-CM | POA: Diagnosis not present

## 2021-03-04 DIAGNOSIS — I10 Essential (primary) hypertension: Secondary | ICD-10-CM

## 2021-03-04 DIAGNOSIS — D509 Iron deficiency anemia, unspecified: Secondary | ICD-10-CM

## 2021-03-04 DIAGNOSIS — D72829 Elevated white blood cell count, unspecified: Secondary | ICD-10-CM | POA: Diagnosis not present

## 2021-03-04 DIAGNOSIS — I25118 Atherosclerotic heart disease of native coronary artery with other forms of angina pectoris: Secondary | ICD-10-CM | POA: Diagnosis not present

## 2021-03-04 DIAGNOSIS — E1159 Type 2 diabetes mellitus with other circulatory complications: Secondary | ICD-10-CM

## 2021-03-04 DIAGNOSIS — I5032 Chronic diastolic (congestive) heart failure: Secondary | ICD-10-CM

## 2021-03-04 LAB — URINE CULTURE: Culture: >=100000 — AB

## 2021-03-04 MED ORDER — CEPHALEXIN 500 MG PO CAPS: 500.0000 mg | ORAL_CAPSULE | Freq: Two times a day (BID) | ORAL | 0 refills | Status: DC

## 2021-03-04 NOTE — Assessment & Plan Note (Signed)
Reviewed hospital labs.  a1c 7.2.  Increased some from last check. On prednisone.  Sugars as outlined.  Discussed low carb diet and exercise.  Follow met b and a1c.

## 2021-03-04 NOTE — Assessment & Plan Note (Signed)
Continue lisinopril, imdur and metoprolol.  Follow pressures.  Follow metabolic panel.  Recent creatinine check in hospital wnl.

## 2021-03-04 NOTE — Assessment & Plan Note (Signed)
Known CAD s/p PCI/DES - mid LAD.  Continue metoprolol, imdur and lisinopril.  No chest pain.  Breathing at baseline.  Follow.

## 2021-03-04 NOTE — Assessment & Plan Note (Signed)
Continue lisinopril, imdur and metoprolol.  Breathing at baseline now.  Follow.

## 2021-03-04 NOTE — Progress Notes (Signed)
[name], female   DOB: 1933/07/21, 87 y.o.   MRN: [id number]   Virtual Visit via telephone Note  This visit type was conducted due to national recommendations for restrictions regarding the COVID-19 pandemic (e.g. social distancing).  This format is felt to be most appropriate for this patient at this time.  All issues noted in this document were discussed and addressed.  No physical exam was performed (except for noted visual exam findings with Video Visits).   I connected with [name] by telephone and verified that I am speaking with the correct person using two identifiers. Location patient: home Location provider: work Persons participating in the virtual visit: patient, provider  The limitations, risks, security and privacy concerns of performing an evaluation and management service by telephone and the availability of in person appointments have been discussed.  It has also been discussed with the patient that there may be a patient responsible charge related to this service. The patient expressed understanding and agreed to proceed.   Reason for visit: hospital follow up.   HPI: Admitted 03/01/21 - 03/02/21 with COPD exacerbation.  Previous covid 19 infection 12/2020.  Presented with sob and wheezing.  Treated with pulmonary toilet, incentive spirometer, IV steroids and bronchodilators. Required oxygen initially.  Weaned off.  Changed to oral steroids.  She is breathing better.  Feels her breathing is back to her baseline.  No chest pain.  Eating.  No nausea or vomiting.  Bowels moving.  States blood sugars in 120s range.  Discussed recent urine culture.  Had reported some back discomfort.  No dysuria.  Able to do her ADLs.  Cooks.  No increased cough or congestion.  No wheezing.     ROS: See pertinent positives and negatives per HPI.  Past Medical History:  Diagnosis Date  . Aortic insufficiency    a. TTE 12/19: EF 55-60%, probable HK of the mid apical  anterior septal myocardium, Gr1DD, mild AI, mildly dilated LA  . Asthma   . CAD (coronary artery disease)    a. NSTEMI 12/19; b. LHC 10/08/18: LM minimal luminal irregs, mLAD-1 95% s/p PCI/DES, mLAD-2 60%, LCx mild diffuse disease throughout, RCA minimal luminal irregs; b. 03/2020 MV: EF>65%, no ischemia/scar.  . CHF (congestive heart failure) ([place])   . Diabetes mellitus ([place])   . Hypercholesterolemia   . Hypertension   . Myocardial infarction ([place])   . Osteopenia   . Palpitations    a. 04/2020 Zio: Avg HR 75. 429 SVT episodes, longest 19 secs @ 133. Occas PACs (3.2%). Rare PVCs (<1%).  . Polymyalgia rheumatica syndrome ([place])   . Reactive airway disease     Past Surgical History:  Procedure Laterality Date  . ABDOMINAL HYSTERECTOMY  1981   prolapse and bleeding, ovaries not removed  . BREAST EXCISIONAL BIOPSY Right   . CHOLECYSTECTOMY N/A 09/02/2019   Procedure: LAPAROSCOPIC CHOLECYSTECTOMY WITH INTRAOPERATIVE CHOLANGIOGRAM;  Surgeon: Olean Ree, MD;  Location: [facility];  Service: General;  Laterality: N/A;  . CORONARY STENT INTERVENTION N/A 10/08/2018   Procedure: CORONARY STENT INTERVENTION;  Surgeon: Wellington Hampshire, MD;  Location: [place] [facility];  Service: Cardiovascular;  Laterality: N/A;  . ENDOSCOPIC RETROGRADE CHOLANGIOPANCREATOGRAPHY (ERCP) WITH PROPOFOL N/A 08/08/2019   Procedure: ENDOSCOPIC RETROGRADE CHOLANGIOPANCREATOGRAPHY (ERCP) WITH PROPOFOL;  Surgeon: Lucilla Lame, MD;  Location: [facility];  Service: Endoscopy;  Laterality: N/A;  . LEFT HEART CATH AND CORONARY ANGIOGRAPHY N/A 10/08/2018   Procedure: LEFT HEART CATH AND CORONARY ANGIOGRAPHY;  Surgeon:  Wellington Hampshire, MD;  Location: [place] [facility];  Service: Cardiovascular;  Laterality: N/A;  . LEFT HEART CATH AND CORONARY ANGIOGRAPHY N/A 08/10/2020   Procedure: LEFT HEART CATH AND CORONARY ANGIOGRAPHY possible percutaneous intervention;  Surgeon: Wellington Hampshire, MD;  Location: [place] [facility];   Service: Cardiovascular;  Laterality: N/A;  . UMBILICAL HERNIA REPAIR  7/94    Family History  Problem Relation Age of Onset  . Heart attack Father   . Arthritis Mother   . Heart disease Mother   . Throat cancer Sister   . Parkinson's disease Sister   . COPD Brother     SOCIAL HX: reviewed.    Current Outpatient Medications:  .  ADVAIR DISKUS 250-50 MCG/DOSE AEPB, INHALE ONE PUFF BY MOUTH EVERY 12 HOURS. RINSE MOUTH AFTER EACH USE (Patient taking differently: Inhale 1 puff into the lungs 2 (two) times daily.), Disp: 60 each, Rfl: 5 .  albuterol (PROVENTIL) (2.5 MG/3ML) 0.083% nebulizer solution, USE 1 VIAL IN NEBULIZER EVERY 4 HOURS AS NEEDED FOR WHEEZING FOR SHORTNESS OF BREATH, Disp: 180 mL, Rfl: 0 .  alendronate (FOSAMAX) 70 MG tablet, Take 70 mg by mouth once a week. , Disp: , Rfl:  .  aspirin 81 MG tablet, Take 81 mg by mouth daily., Disp: , Rfl:  .  atorvastatin (LIPITOR) 80 MG tablet, Take 1 tablet (80 mg total) by mouth daily at 6 PM., Disp: 90 tablet, Rfl: 3 .  cephALEXin (KEFLEX) 500 MG capsule, Take 1 capsule (500 mg total) by mouth 2 (two) times daily., Disp: 10 capsule, Rfl: 0 .  fluticasone (FLONASE) 50 MCG/ACT nasal spray, Use 2 spray(s) in each nostril once daily (Patient taking differently: Place 2 sprays into both nostrils daily.), Disp: 16 g, Rfl: 0 .  lisinopril (ZESTRIL) 20 MG tablet, Take 1 tablet by mouth once daily (Patient taking differently: Take 20 mg by mouth daily.), Disp: 90 tablet, Rfl: 0 .  metFORMIN (GLUCOPHAGE) 500 MG tablet, TAKE 1 TABLET BY MOUTH TWICE DAILY WITH A MEAL (Patient taking differently: Take 500 mg by mouth 2 (two) times daily with a meal.), Disp: 180 tablet, Rfl: 0 .  metoprolol tartrate (LOPRESSOR) 25 MG tablet, Take 1 tablet by mouth twice daily (Patient taking differently: Take 25 mg by mouth 2 (two) times daily.), Disp: 90 tablet, Rfl: 0 .  nitroGLYCERIN (NITROSTAT) 0.4 MG SL tablet, Place 1 tablet (0.4 mg total) under the tongue every  5 (five) minutes as needed for chest pain., Disp: 30 tablet, Rfl: 12 .  PROAIR HFA 108 (90 Base) MCG/ACT inhaler, Inhale 2 puffs into the lungs every 4 (four) hours as needed for wheezing or shortness of breath., Disp: 18 g, Rfl: 0 .  Vitamin D, Ergocalciferol, (DRISDOL) 50000 units CAPS capsule, Take 50,000 Units by mouth once a week., Disp: , Rfl: 3  EXAM:  GENERAL: alert. Sounds to be in no acute distress.  Answering questions appropriately.   PSYCH/NEURO: pleasant and cooperative, no obvious depression or anxiety, speech and thought processing grossly intact  ASSESSMENT AND PLAN:  Discussed the following assessment and plan:  Problem List Items Addressed This Visit    Anemia    Follow cbc.       Chronic diastolic CHF (congestive heart failure) (HCC)    Continue lisinopril, imdur and metoprolol.  Breathing at baseline now.  Follow.       Coronary artery disease of native heart with stable angina pectoris ([place])    Known CAD s/p PCI/DES -  mid LAD.  Continue metoprolol, imdur and lisinopril.  No chest pain.  Breathing at baseline.  Follow.       Diabetes mellitus with cardiac complication [facility])    Reviewed hospital labs.  a1c 7.2.  Increased some from last check. On prednisone.  Sugars as outlined.  Discussed low carb diet and exercise.  Follow met b and a1c.       Hypercholesterolemia    Continue lipitor.  Low cholesterol diet and exercise.  Follow lipid panel and liver function tests.        Hypertension    Continue lisinopril, imdur and metoprolol.  Follow pressures.  Follow metabolic panel.  Recent creatinine check in hospital wnl.       Leukocytosis    Follow cbc.       Neuropathy    Stable.       Type 2 diabetes mellitus with cardiac complication (HCC)    Low carb diet and exercise.  a1c as outlined.  Increased.  Has been on prednisone.  Follow met b and a1c.           I discussed the assessment and treatment plan with the patient. The patient was provided  an opportunity to ask questions and all were answered. The patient agreed with the plan and demonstrated an understanding of the instructions.   The patient was advised to call back or seek an in-person evaluation if the symptoms worsen or if the condition fails to improve as anticipated.  I provided 22 minutes of non-face-to-face time during this encounter.   Einar Pheasant, MD

## 2021-03-09 ENCOUNTER — Ambulatory Visit: Payer: Medicare HMO | Admitting: Family

## 2021-03-12 ENCOUNTER — Telehealth: Payer: Self-pay | Admitting: Internal Medicine

## 2021-03-12 NOTE — Telephone Encounter (Signed)
Martie Lee from [name] Nurse called in about the PT. She states it seems the PT is not compliant with their medication and not getting them refill like she is suppose to with some of them being refilled last in 2017 and 2020. The meds are:  metoprolol tartrate (LOPRESSOR) 25 MG tablet lisinopril (ZESTRIL) 20 MG tablet ADVAIR DISKUS 250-50 MCG/DOSE AEPB metFORMIN (GLUCOPHAGE) 500 MG tablet  She would like to have a appointment schedule with the Dr for the PT to discuss compliance with the medication as well as to get a ER followup with the PT for her COPD she had went to the ER for.  Martie Lee did not leave a # for followup.

## 2021-03-12 NOTE — Telephone Encounter (Signed)
Confirmed with pharmacy that metoprolol, lisinopril, and metformin have been filled. No refill of advair recently but confirmed with patient that she only uses prn. No visit needed at this time. No call back number for Tanya Cole to let her know

## 2021-03-14 ENCOUNTER — Encounter: Payer: Self-pay | Admitting: Internal Medicine

## 2021-03-14 NOTE — Assessment & Plan Note (Signed)
Continue lipitor.  Low cholesterol diet and exercise.  Follow lipid panel and liver function tests.   

## 2021-03-14 NOTE — Assessment & Plan Note (Signed)
Follow cbc.  

## 2021-03-14 NOTE — Assessment & Plan Note (Signed)
Stable

## 2021-03-14 NOTE — Assessment & Plan Note (Signed)
Low carb diet and exercise.  a1c as outlined.  Increased.  Has been on prednisone.  Follow met b and a1c.

## 2021-03-17 DIAGNOSIS — M810 Age-related osteoporosis without current pathological fracture: Secondary | ICD-10-CM | POA: Diagnosis not present

## 2021-03-17 DIAGNOSIS — E559 Vitamin D deficiency, unspecified: Secondary | ICD-10-CM | POA: Diagnosis not present

## 2021-03-18 ENCOUNTER — Other Ambulatory Visit: Payer: Self-pay

## 2021-03-18 ENCOUNTER — Ambulatory Visit: Payer: Medicare HMO | Admitting: Cardiovascular Disease

## 2021-03-18 ENCOUNTER — Emergency Department
Admission: EM | Admit: 2021-03-18 | Discharge: 2021-03-18 | Disposition: A | Discharge status: Home / Self Care | Payer: Medicare HMO | Attending: Emergency Medicine | Admitting: Emergency Medicine

## 2021-03-18 DIAGNOSIS — Z7952 Long term (current) use of systemic steroids: Secondary | ICD-10-CM | POA: Diagnosis not present

## 2021-03-18 DIAGNOSIS — Z7984 Long term (current) use of oral hypoglycemic drugs: Secondary | ICD-10-CM | POA: Insufficient documentation

## 2021-03-18 DIAGNOSIS — E119 Type 2 diabetes mellitus without complications: Secondary | ICD-10-CM | POA: Diagnosis not present

## 2021-03-18 DIAGNOSIS — Z7982 Long term (current) use of aspirin: Secondary | ICD-10-CM | POA: Diagnosis not present

## 2021-03-18 DIAGNOSIS — M25511 Pain in right shoulder: Secondary | ICD-10-CM | POA: Insufficient documentation

## 2021-03-18 DIAGNOSIS — Z20822 Contact with and (suspected) exposure to covid-19: Secondary | ICD-10-CM | POA: Insufficient documentation

## 2021-03-18 DIAGNOSIS — M79662 Pain in left lower leg: Secondary | ICD-10-CM | POA: Diagnosis not present

## 2021-03-18 DIAGNOSIS — J45909 Unspecified asthma, uncomplicated: Secondary | ICD-10-CM | POA: Insufficient documentation

## 2021-03-18 DIAGNOSIS — M79661 Pain in right lower leg: Secondary | ICD-10-CM | POA: Insufficient documentation

## 2021-03-18 DIAGNOSIS — I5032 Chronic diastolic (congestive) heart failure: Secondary | ICD-10-CM | POA: Diagnosis not present

## 2021-03-18 DIAGNOSIS — I2510 Atherosclerotic heart disease of native coronary artery without angina pectoris: Secondary | ICD-10-CM | POA: Insufficient documentation

## 2021-03-18 DIAGNOSIS — I110 Hypertensive heart disease with heart failure: Secondary | ICD-10-CM | POA: Diagnosis not present

## 2021-03-18 DIAGNOSIS — I959 Hypotension, unspecified: Secondary | ICD-10-CM | POA: Diagnosis not present

## 2021-03-18 DIAGNOSIS — I4891 Unspecified atrial fibrillation: Secondary | ICD-10-CM | POA: Diagnosis not present

## 2021-03-18 DIAGNOSIS — Z79899 Other long term (current) drug therapy: Secondary | ICD-10-CM | POA: Insufficient documentation

## 2021-03-18 DIAGNOSIS — J441 Chronic obstructive pulmonary disease with (acute) exacerbation: Secondary | ICD-10-CM | POA: Diagnosis not present

## 2021-03-18 DIAGNOSIS — R531 Weakness: Secondary | ICD-10-CM | POA: Insufficient documentation

## 2021-03-18 DIAGNOSIS — Z9861 Coronary angioplasty status: Secondary | ICD-10-CM | POA: Insufficient documentation

## 2021-03-18 DIAGNOSIS — I252 Old myocardial infarction: Secondary | ICD-10-CM | POA: Diagnosis not present

## 2021-03-18 DIAGNOSIS — I10 Essential (primary) hypertension: Secondary | ICD-10-CM | POA: Diagnosis not present

## 2021-03-18 DIAGNOSIS — M2550 Pain in unspecified joint: Secondary | ICD-10-CM

## 2021-03-18 LAB — URINALYSIS, COMPLETE (UACMP) WITH MICROSCOPIC
Bilirubin Urine: NEGATIVE
Glucose, UA: NEGATIVE mg/dL
Hgb urine dipstick: NEGATIVE
Ketones, ur: NEGATIVE mg/dL
Nitrite: NEGATIVE
Protein, ur: NEGATIVE mg/dL
Specific Gravity, Urine: 1.021 (ref 1.005–1.030)
pH: 5 (ref 5.0–8.0)

## 2021-03-18 LAB — BASIC METABOLIC PANEL
Anion gap: 10 (ref 5–15)
BUN: 21 mg/dL (ref 8–23)
CO2: 22 mmol/L (ref 22–32)
Calcium: 9.2 mg/dL (ref 8.9–10.3)
Chloride: 106 mmol/L (ref 98–111)
Creatinine, Ser: 0.74 mg/dL (ref 0.44–1.00)
GFR, Estimated: >60 mL/min (ref >60)
Glucose, Bld: 161 mg/dL — ABNORMAL HIGH (ref 70–99)
Potassium: 4 mmol/L (ref 3.5–5.1)
Sodium: 138 mmol/L (ref 135–145)

## 2021-03-18 LAB — CBC
HCT: 37.2 % (ref 36.0–46.0)
Hemoglobin: 12.4 g/dL (ref 12.0–15.0)
MCH: 28.9 pg (ref 26.0–34.0)
MCHC: 33.3 g/dL (ref 30.0–36.0)
MCV: 86.7 fL (ref 80.0–100.0)
Platelets: 235 10*3/uL (ref 150–400)
RBC: 4.29 MIL/uL (ref 3.87–5.11)
RDW: 13.8 % (ref 11.5–15.5)
WBC: 13.9 10*3/uL — ABNORMAL HIGH (ref 4.0–10.5)
nRBC: 0 % (ref 0.0–0.2)

## 2021-03-18 LAB — SARS CORONAVIRUS 2 (TAT 6-24 HRS): SARS Coronavirus 2: NEGATIVE

## 2021-03-18 MED ORDER — SODIUM CHLORIDE 0.9 % IV BOLUS
1000.0000 mL | Freq: Once | INTRAVENOUS | Status: AC
  Administered 2021-03-18: 1000 mL via INTRAVENOUS

## 2021-03-18 MED ORDER — IBUPROFEN 600 MG PO TABS
600.0000 mg | ORAL_TABLET | Freq: Once | ORAL | Status: AC
  Administered 2021-03-18: 600 mg via ORAL
  Filled 2021-03-18: qty 1

## 2021-03-18 MED ORDER — IBUPROFEN 600 MG PO TABS: 600.0000 mg | ORAL_TABLET | Freq: Three times a day (TID) | ORAL | 0 refills | Status: DC | PRN

## 2021-03-18 MED ORDER — KETOROLAC TROMETHAMINE 30 MG/ML IJ SOLN
15.0000 mg | Freq: Once | INTRAMUSCULAR | Status: AC
  Administered 2021-03-18: 15 mg via INTRAVENOUS
  Filled 2021-03-18: qty 1

## 2021-03-18 NOTE — ED Notes (Signed)
Pt and family verbalized understanding of d/c instructions. Prescriptions and follow-up care reviewed. Pt assisted to ED entrance via wheelchair, NAD noted.

## 2021-03-18 NOTE — Progress Notes (Signed)
[facility] ED26 [facility] Hospital Liaison note:  This is a pending outpatient-based Palliative Care patient. Will continue to follow for disposition.  Please call with any outpatient palliative questions or concerns.  Thank you, Dee Curry, LPN [facility] Hospital Liaison [phone] 

## 2021-03-18 NOTE — ED Triage Notes (Signed)
Pt to ER via [facility] with complaints of weakness x3 days, also reports body aches and bilateral leg pain. Pt also reports right shoulder pain. States issues with ambulating. Pt tearful on arrival.

## 2021-03-18 NOTE — ED Notes (Signed)
Pt c/o bilateral leg pain that just started up again. Pt noted to be tearful at this time. MD Bradler aware at this time. Per verbal order from MD Bradler, 600mg  PO ibuprofen at this time.

## 2021-03-18 NOTE — ED Provider Notes (Signed)
[facility] Emergency Department Provider Note   ____________________________________________   Event Date/Time   First MD Initiated Contact with Patient 03/18/21 [phone]     (approximate)  I have reviewed the triage vital signs and the nursing notes.   HISTORY  Chief Complaint Weakness    HPI [place] Pagel is a 87 y.o. female with below stated past medical history and significant for osteoarthritis who presents for generalized weakness over the last 3 days and 24 hours of generalized arthralgias.  Patient states that she had been having right shoulder pain over the course of the day yesterday however when she woke up this morning it felt like "all my joints hurt".  Patient states that this is happened in the past and resolved spontaneously.  Patient denies ever being worked up or family history of rheumatoid arthritis.  States that she is having difficulty with ambulation due to the pain in her bilateral lower extremities.  Denies any relieving factors for the symptoms.  Patient has not taken any medications for the symptoms.  Patient currently denies any vision changes, tinnitus, difficulty speaking, facial droop, sore throat, chest pain, shortness of breath, abdominal pain, nausea/vomiting/diarrhea, dysuria, or weakness/numbness/paresthesias in any extremity         Past Medical History:  Diagnosis Date  . Aortic insufficiency    a. TTE 12/19: EF 55-60%, probable HK of the mid apical anterior septal myocardium, Gr1DD, mild AI, mildly dilated LA  . Asthma   . CAD (coronary artery disease)    a. NSTEMI 12/19; b. LHC 10/08/18: LM minimal luminal irregs, mLAD-1 95% s/p PCI/DES, mLAD-2 60%, LCx mild diffuse disease throughout, RCA minimal luminal irregs; b. 03/2020 MV: EF>65%, no ischemia/scar.  . CHF (congestive heart failure) (HCC)   . Diabetes mellitus (HCC)   . Hypercholesterolemia   . Hypertension   . Myocardial infarction (HCC)   . Osteopenia   .  Palpitations    a. 04/2020 Zio: Avg HR 75. 429 SVT episodes, longest 19 secs @ 133. Occas PACs (3.2%). Rare PVCs (<1%).  . Polymyalgia rheumatica syndrome (HCC)   . Reactive airway disease     Patient Active Problem List   Diagnosis Date Noted  . Calcified granuloma of lung (HCC) 03/02/2021  . Acute exacerbation of chronic obstructive pulmonary disease (COPD) (HCC) 03/01/2021  . COPD exacerbation (HCC) 11/29/2020  . Chronic diastolic CHF (congestive heart failure) (HCC) 11/29/2020  . CAP (community acquired pneumonia) 11/29/2020  . Rib pain on right side 10/19/2020  . Abnormal CXR 10/19/2020  . Type 2 diabetes mellitus with cardiac complication (HCC) 10/08/2020  . Elevated troponin 10/08/2020  . Acute on chronic heart failure with preserved ejection fraction (HFpEF) (HCC) 10/08/2020  . COPD with acute exacerbation (HCC) 08/19/2020  . History of non-ST elevation myocardial infarction (NSTEMI) 08/07/2020  . Asthma 08/07/2020  . Leukocytosis 08/07/2020  . Left shoulder pain 07/12/2020  . Iron deficiency 04/16/2020  . Anemia 04/04/2020  . SOB (shortness of breath) 03/25/2020  . Cough 09/30/2019  . Gallstone pancreatitis   . Pre-op evaluation 08/30/2019  . Cholecystitis 08/05/2019  . Choledocholithiasis   . Respiratory illness 06/18/2019  . 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) 12/29/2018  . Chest pain 10/07/2018  . Memory change 05/27/2018  . Osteoporosis 02/05/2018  . Weight loss 06/24/2017  . Abdominal pain, left lower quadrant 10/05/2016  . Long term current use of systemic steroids 05/16/2016  . Elevated erythrocyte sedimentation rate 05/04/2016  .  Back pain 04/29/2016  . Fatigue 05/24/2015  . Health care maintenance 01/25/2015  . UTI (urinary tract infection) 07/07/2014  . Neuropathy 03/24/2014  . Diverticulitis 02/24/2013  . Reactive airway disease 09/15/2012  . Hypertension 09/14/2012  . Hypercholesterolemia 09/14/2012  . Diabetes  mellitus with cardiac complication (HCC) 09/14/2012    Past Surgical History:  Procedure Laterality Date  . ABDOMINAL HYSTERECTOMY  1981   prolapse and bleeding, ovaries not removed  . BREAST EXCISIONAL BIOPSY Right   . CHOLECYSTECTOMY N/A 09/02/2019   Procedure: LAPAROSCOPIC CHOLECYSTECTOMY WITH INTRAOPERATIVE CHOLANGIOGRAM;  Surgeon: Henrene Dodge, MD;  Location: [facility] OR[facility];  Service: General;  Laterality: N/A;  . CORONARY STENT INTERVENTION N/A 10/08/2018   Procedure: CORONARY STENT INTERVENTION;  Surgeon: Iran Ouch, MD;  Location: [facility];  Service: Cardiovascular;  Laterality: N/A;  . ENDOSCOPIC RETROGRADE CHOLANGIOPANCREATOGRAPHY (ERCP) WITH PROPOFOL N/A 08/08/2019   Procedure: ENDOSCOPIC RETROGRADE CHOLANGIOPANCREATOGRAPHY (ERCP) WITH PROPOFOL;  Surgeon: Midge Minium, MD;  Location: [facility];  Service: Endoscopy;  Laterality: N/A;  . LEFT HEART CATH AND CORONARY ANGIOGRAPHY N/A 10/08/2018   Procedure: LEFT HEART CATH AND CORONARY ANGIOGRAPHY;  Surgeon: Iran Ouch, MD;  Location: [facility];  Service: Cardiovascular;  Laterality: N/A;  . LEFT HEART CATH AND CORONARY ANGIOGRAPHY N/A 08/10/2020   Procedure: LEFT HEART CATH AND CORONARY ANGIOGRAPHY possible percutaneous intervention;  Surgeon: Iran Ouch, MD;  Location: [facility];  Service: Cardiovascular;  Laterality: N/A;  . UMBILICAL HERNIA REPAIR  7/94    Prior to Admission medications   Medication Sig Start Date End Date Taking? Authorizing Provider  ibuprofen (ADVIL) 600 MG tablet Take 1 tablet (600 mg total) by mouth every 8 (eight) hours as needed for mild pain or moderate pain. 03/18/21  Yes Kalan Rinn, Clent Jacks, MD  ADVAIR DISKUS 250-50 MCG/DOSE AEPB INHALE ONE PUFF BY MOUTH EVERY 12 HOURS. RINSE MOUTH AFTER EACH USE Patient taking differently: Inhale 1 puff into the lungs 2 (two) times daily. 02/17/16   Dale Wixom, MD  albuterol (PROVENTIL) (2.5 MG/3ML) 0.083% nebulizer  solution USE 1 VIAL IN NEBULIZER EVERY 4 HOURS AS NEEDED FOR WHEEZING FOR SHORTNESS OF BREATH 02/15/21   Dale [place]ville, MD  alendronate (FOSAMAX) 70 MG tablet Take 70 mg by mouth once a week.  11/06/17   [provider]  aspirin 81 MG tablet Take 81 mg by mouth daily.    [provider]  atorvastatin (LIPITOR) 80 MG tablet Take 1 tablet (80 mg total) by mouth daily at 6 PM. 04/01/20   Alver Sorrow, NP  cephALEXin (KEFLEX) 500 MG capsule Take 1 capsule (500 mg total) by mouth 2 (two) times daily. 03/04/21   Dale Crete, MD  fluticasone (FLONASE) 50 MCG/ACT nasal spray Use 2 spray(s) in each nostril once daily Patient taking differently: Place 2 sprays into both nostrils daily. 08/03/20   Dale Aten, MD  lisinopril (ZESTRIL) 20 MG tablet Take 1 tablet by mouth once daily Patient taking differently: Take 20 mg by mouth daily. 11/17/20   Dunn, Raymon Mutton, PA-C  metFORMIN (GLUCOPHAGE) 500 MG tablet TAKE 1 TABLET BY MOUTH TWICE DAILY WITH A MEAL Patient taking differently: Take 500 mg by mouth 2 (two) times daily with a meal. 09/29/20   Dale , MD  metoprolol tartrate (LOPRESSOR) 25 MG tablet Take 1 tablet by mouth twice daily Patient taking differently: Take 25 mg by mouth 2 (two) times daily. 12/21/20   Iran Ouch, MD  nitroGLYCERIN (NITROSTAT)  0.4 MG SL tablet Place 1 tablet (0.4 mg total) under the tongue every 5 (five) minutes as needed for chest pain. 10/09/18   Adrian Saran, MD  PROAIR HFA 108 (90 Base) MCG/ACT inhaler Inhale 2 puffs into the lungs every 4 (four) hours as needed for wheezing or shortness of breath. 08/20/20   Rolly Salter, MD  Vitamin D, Ergocalciferol, (DRISDOL) 50000 units CAPS capsule Take 50,000 Units by mouth once a week. 06/24/18   [provider]    Allergies Tramadol  Family History  Problem Relation Age of Onset  . Heart attack Father   . Arthritis Mother   . Heart disease Mother   . Throat cancer Sister   .  Parkinson's disease Sister   . COPD Brother     Social History Social History   Tobacco Use  . Smoking status: Never Smoker  . Smokeless tobacco: Never Used  Vaping Use  . Vaping Use: Never used  Substance Use Topics  . Alcohol use: No    Alcohol/week: 0.0 standard drinks  . Drug use: No    Review of Systems Constitutional: No fever/chills Eyes: No visual changes. ENT: No sore throat. Cardiovascular: Denies chest pain. Respiratory: Denies shortness of breath. Gastrointestinal: No abdominal pain.  No nausea, no vomiting.  No diarrhea. Genitourinary: Negative for dysuria. Musculoskeletal: Endorses acute generalized arthralgias Skin: Negative for rash. Neurological: Negative for headaches, weakness/numbness/paresthesias in any extremity Psychiatric: Negative for suicidal ideation/homicidal ideation   ____________________________________________   PHYSICAL EXAM:  VITAL SIGNS: ED Triage Vitals  Enc Vitals Group     BP 03/18/21 0712 121/65     Pulse Rate 03/18/21 0712 82     Resp 03/18/21 0712 20     Temp 03/18/21 0712 98.5 F (36.9 C)     Temp Source 03/18/21 0712 Oral     SpO2 03/18/21 0712 96 %     Weight 03/18/21 0713 118 lb (53.5 kg)     Height 03/18/21 0713 5\' 1"  (1.549 m)     Head Circumference --      Peak Flow --      Pain Score 03/18/21 0713 10     Pain Loc --      Pain Edu? --      Excl. in GC? --    Constitutional: Alert and oriented. Well appearing and in no acute distress. Eyes: Conjunctivae are normal. PERRL. Head: Atraumatic. Nose: No congestion/rhinnorhea. Mouth/Throat: Mucous membranes are moist. Neck: No stridor Cardiovascular: Grossly normal heart sounds.  Good peripheral circulation. Respiratory: Normal respiratory effort.  No retractions. Gastrointestinal: Soft and nontender. No distention. Musculoskeletal: No obvious deformities Neurologic:  Normal speech and language. No gross focal neurologic deficits are appreciated. Skin:  Skin  is warm and dry. No rash noted. Psychiatric: Mood and affect are normal. Speech and behavior are normal.  ____________________________________________   LABS (all labs ordered are listed, but only abnormal results are displayed)  Labs Reviewed  BASIC METABOLIC PANEL - Abnormal; Notable for the following components:      Result Value   Glucose, Bld 161 (*)    All other components within normal limits  CBC - Abnormal; Notable for the following components:   WBC 13.9 (*)    All other components within normal limits  URINALYSIS, COMPLETE (UACMP) WITH MICROSCOPIC - Abnormal; Notable for the following components:   Color, Urine YELLOW (*)    APPearance CLOUDY (*)    Leukocytes,Ua TRACE (*)    Bacteria, UA RARE (*)  All other components within normal limits  SARS CORONAVIRUS 2 (TAT 6-24 HRS)  CBG MONITORING, ED   ____________________________________________  EKG  ED ECG REPORT I, Merwyn Katos, the attending physician, personally viewed and interpreted this ECG.  Date: 03/18/2021 EKG Time: 0718 Rate: 82 Rhythm: normal sinus rhythm QRS Axis: normal Intervals: normal ST/T Wave abnormalities: normal Narrative Interpretation: no evidence of acute ischemia   PROCEDURES  Procedure(s) performed (including Critical Care):  Procedures   ____________________________________________   INITIAL IMPRESSION / ASSESSMENT AND PLAN / ED COURSE  As part of my medical decision making, I reviewed the following data within the electronic MEDICAL RECORD NUMBER Nursing notes reviewed and incorporated, Labs reviewed, EKG interpreted, Old chart reviewed, Radiograph reviewed and Notes from prior ED visits reviewed and incorporated        87 year old female with a history of osteoarthritis presents for generalized arthralgias Given history, exam and workup I have low suspicion for fracture, dislocation, significant ligamentous injury, septic arthritis, gout flare, new autoimmune arthropathy,  or gonococcal arthropathy.  Interventions: Analgesia Disposition: Discharge home with strict return precautions and instructions for prompt primary care follow up in the next week.      ____________________________________________   FINAL CLINICAL IMPRESSION(S) / ED DIAGNOSES  Final diagnoses:  Generalized weakness  Multiple joint pain     ED Discharge Orders         Ordered    ibuprofen (ADVIL) 600 MG tablet  Every 8 hours PRN        03/18/21 1349           Note:  This document was prepared using Dragon voice recognition software and may include unintentional dictation errors.   Merwyn Katos, MD 03/18/21 [phone]

## 2021-03-19 ENCOUNTER — Telehealth: Payer: Self-pay | Admitting: Internal Medicine

## 2021-03-19 NOTE — Telephone Encounter (Signed)
Patient was in the ED and they want her to follow up with her provider. No appointments available at time of call.

## 2021-03-22 ENCOUNTER — Other Ambulatory Visit: Payer: Self-pay

## 2021-03-22 ENCOUNTER — Ambulatory Visit (INDEPENDENT_AMBULATORY_CARE_PROVIDER_SITE_OTHER): Payer: Medicare HMO

## 2021-03-22 DIAGNOSIS — E538 Deficiency of other specified B group vitamins: Secondary | ICD-10-CM

## 2021-03-22 MED ORDER — CYANOCOBALAMIN 1000 MCG/ML IJ SOLN
1000.0000 ug | Freq: Once | INTRAMUSCULAR | Status: AC
  Administered 2021-03-22: 1000 ug via INTRAMUSCULAR

## 2021-03-22 NOTE — Progress Notes (Signed)
Patient presented for B 12 injection to left deltoid, patient voiced no concerns nor showed any signs of distress during injection. 

## 2021-03-23 NOTE — Telephone Encounter (Signed)
Called patient to schedule appt. No answer and no machine to leave VM

## 2021-03-25 NOTE — Telephone Encounter (Signed)
Called pt to schedule. Confirmed doing ok. Feeling better. She has been scheduled for 6/2 at 11. She says she may have to do appt virtual

## 2021-03-28 ENCOUNTER — Other Ambulatory Visit: Payer: Self-pay | Admitting: Internal Medicine

## 2021-03-31 ENCOUNTER — Encounter: Payer: Self-pay | Admitting: Internal Medicine

## 2021-03-31 ENCOUNTER — Other Ambulatory Visit (INDEPENDENT_AMBULATORY_CARE_PROVIDER_SITE_OTHER): Payer: 59

## 2021-03-31 ENCOUNTER — Other Ambulatory Visit: Payer: Self-pay

## 2021-03-31 DIAGNOSIS — E7800 Pure hypercholesterolemia, unspecified: Secondary | ICD-10-CM

## 2021-03-31 DIAGNOSIS — D509 Iron deficiency anemia, unspecified: Secondary | ICD-10-CM

## 2021-03-31 DIAGNOSIS — E1159 Type 2 diabetes mellitus with other circulatory complications: Secondary | ICD-10-CM

## 2021-03-31 DIAGNOSIS — I10 Essential (primary) hypertension: Secondary | ICD-10-CM

## 2021-03-31 LAB — BASIC METABOLIC PANEL
BUN: 21 mg/dL (ref 6–23)
CO2: 28 mEq/L (ref 19–32)
Calcium: 9.8 mg/dL (ref 8.4–10.5)
Chloride: 103 mEq/L (ref 96–112)
Creatinine, Ser: 0.88 mg/dL (ref 0.40–1.20)
GFR: 59.77 mL/min — ABNORMAL LOW (ref >60.00)
Glucose, Bld: 152 mg/dL — ABNORMAL HIGH (ref 70–99)
Potassium: 3.9 mEq/L (ref 3.5–5.1)
Sodium: 142 mEq/L (ref 135–145)

## 2021-03-31 LAB — CBC WITH DIFFERENTIAL/PLATELET
Basophils Absolute: 0 10*3/uL (ref 0.0–0.1)
Basophils Relative: 0.5 % (ref 0.0–3.0)
Eosinophils Absolute: 0.6 10*3/uL (ref 0.0–0.7)
Eosinophils Relative: 6.4 % — ABNORMAL HIGH (ref 0.0–5.0)
HCT: 39.8 % (ref 36.0–46.0)
Hemoglobin: 13.1 g/dL (ref 12.0–15.0)
Lymphocytes Relative: 20 % (ref 12.0–46.0)
Lymphs Abs: 1.9 10*3/uL (ref 0.7–4.0)
MCHC: 33 g/dL (ref 30.0–36.0)
MCV: 87.5 fl (ref 78.0–100.0)
Monocytes Absolute: 0.6 10*3/uL (ref 0.1–1.0)
Monocytes Relative: 6.6 % (ref 3.0–12.0)
Neutro Abs: 6.4 10*3/uL (ref 1.4–7.7)
Neutrophils Relative %: 66.5 % (ref 43.0–77.0)
Platelets: 308 10*3/uL (ref 150.0–400.0)
RBC: 4.54 Mil/uL (ref 3.87–5.11)
RDW: 14.4 % (ref 11.5–15.5)
WBC: 9.7 10*3/uL (ref 4.0–10.5)

## 2021-03-31 LAB — LIPID PANEL
Cholesterol: 207 mg/dL — ABNORMAL HIGH (ref 0–200)
HDL: 48.5 mg/dL (ref >39.00)
LDL Cholesterol: 120 mg/dL — ABNORMAL HIGH (ref 0–99)
NonHDL: 158.81
Total CHOL/HDL Ratio: 4
Triglycerides: 192 mg/dL — ABNORMAL HIGH (ref 0.0–149.0)
VLDL: 38.4 mg/dL (ref 0.0–40.0)

## 2021-03-31 LAB — HEPATIC FUNCTION PANEL
ALT: 10 U/L (ref 0–35)
AST: 11 U/L (ref 0–37)
Albumin: 4.4 g/dL (ref 3.5–5.2)
Alkaline Phosphatase: 71 U/L (ref 39–117)
Bilirubin, Direct: 0.1 mg/dL (ref 0.0–0.3)
Total Bilirubin: 0.6 mg/dL (ref 0.2–1.2)
Total Protein: 6.9 g/dL (ref 6.0–8.3)

## 2021-03-31 LAB — HEMOGLOBIN A1C: Hgb A1c MFr Bld: 7.3 % — ABNORMAL HIGH (ref 4.6–6.5)

## 2021-03-31 LAB — TSH: TSH: 3.56 u[IU]/mL (ref 0.35–4.50)

## 2021-04-01 ENCOUNTER — Telehealth (INDEPENDENT_AMBULATORY_CARE_PROVIDER_SITE_OTHER): Payer: 59 | Admitting: Internal Medicine

## 2021-04-01 DIAGNOSIS — E1159 Type 2 diabetes mellitus with other circulatory complications: Secondary | ICD-10-CM

## 2021-04-01 DIAGNOSIS — M2550 Pain in unspecified joint: Secondary | ICD-10-CM

## 2021-04-01 DIAGNOSIS — I10 Essential (primary) hypertension: Secondary | ICD-10-CM

## 2021-04-01 DIAGNOSIS — J441 Chronic obstructive pulmonary disease with (acute) exacerbation: Secondary | ICD-10-CM

## 2021-04-01 DIAGNOSIS — I5032 Chronic diastolic (congestive) heart failure: Secondary | ICD-10-CM

## 2021-04-01 DIAGNOSIS — I25118 Atherosclerotic heart disease of native coronary artery with other forms of angina pectoris: Secondary | ICD-10-CM | POA: Diagnosis not present

## 2021-04-01 MED ORDER — PREDNISONE 10 MG PO TABS: ORAL_TABLET | ORAL | 0 refills | Status: DC

## 2021-04-01 NOTE — Progress Notes (Signed)
[name], female   DOB: 1933/01/22, 87 y.o.   MRN: [id number]   Subjective:    [name], female    DOB: Apr 05, 1933, 87 y.o.   MRN: [id number]  HPI This visit occurred during the SARS-CoV-2 public health emergency.  Safety protocols were in place, including screening questions prior to the visit, additional usage of staff PPE, and extensive cleaning of exam room while observing appropriate contact time as indicated for disinfecting solutions.  Patient here for   Past Medical History:  Diagnosis Date  . Aortic insufficiency    a. TTE 12/19: EF 55-60%, probable HK of the mid apical anterior septal myocardium, Gr1DD, mild AI, mildly dilated LA  . Asthma   . CAD (coronary artery disease)    a. NSTEMI 12/19; b. LHC 10/08/18: LM minimal luminal irregs, mLAD-1 95% s/p PCI/DES, mLAD-2 60%, LCx mild diffuse disease throughout, RCA minimal luminal irregs; b. 03/2020 MV: EF>65%, no ischemia/scar.  . CHF (congestive heart failure) (HCC)   . Diabetes mellitus (HCC)   . Hypercholesterolemia   . Hypertension   . Myocardial infarction (HCC)   . Osteopenia   . Palpitations    a. 04/2020 Zio: Avg HR 75. 429 SVT episodes, longest 19 secs @ 133. Occas PACs (3.2%). Rare PVCs (<1%).  . Polymyalgia rheumatica syndrome (HCC)   . Reactive airway disease    Past Surgical History:  Procedure Laterality Date  . ABDOMINAL HYSTERECTOMY  1981   prolapse and bleeding, ovaries not removed  . BREAST EXCISIONAL BIOPSY Right   . CHOLECYSTECTOMY N/A 09/02/2019   Procedure: LAPAROSCOPIC CHOLECYSTECTOMY WITH INTRAOPERATIVE CHOLANGIOGRAM;  Surgeon: Henrene Dodge, MD;  Location: [facility] ORS;  Service: General;  Laterality: N/A;  . CORONARY STENT INTERVENTION N/A 10/08/2018   Procedure: CORONARY STENT INTERVENTION;  Surgeon: Iran Ouch, MD;  Location: [facility] INVASIVE CV LAB;  Service: Cardiovascular;  Laterality: N/A;  . ENDOSCOPIC RETROGRADE CHOLANGIOPANCREATOGRAPHY (ERCP) WITH  PROPOFOL N/A 08/08/2019   Procedure: ENDOSCOPIC RETROGRADE CHOLANGIOPANCREATOGRAPHY (ERCP) WITH PROPOFOL;  Surgeon: Midge Minium, MD;  Location: [facility] ENDOSCOPY;  Service: Endoscopy;  Laterality: N/A;  . LEFT HEART CATH AND CORONARY ANGIOGRAPHY N/A 10/08/2018   Procedure: LEFT HEART CATH AND CORONARY ANGIOGRAPHY;  Surgeon: Iran Ouch, MD;  Location: [facility];  Service: Cardiovascular;  Laterality: N/A;  . LEFT HEART CATH AND CORONARY ANGIOGRAPHY N/A 08/10/2020   Procedure: LEFT HEART CATH AND CORONARY ANGIOGRAPHY possible percutaneous intervention;  Surgeon: Iran Ouch, MD;  Location: [facility] INVASIVE CV LAB;  Service: Cardiovascular;  Laterality: N/A;  . UMBILICAL HERNIA REPAIR  7/94   Family History  Problem Relation Age of Onset  . Heart attack Father   . Arthritis Mother   . Heart disease Mother   . Throat cancer Sister   . Parkinson's disease Sister   . COPD Brother    Social History   Socioeconomic History  . Marital status: Widowed    Spouse name: Not on file  . Number of children: 3  . Years of education: Not on file  . Highest education level: Not on file  Occupational History  . Not on file  Tobacco Use  . Smoking status: Never Smoker  . Smokeless tobacco: Never Used  Vaping Use  . Vaping Use: Never used  Substance and Sexual Activity  . Alcohol use: No    Alcohol/week: 0.0 standard drinks  . Drug use: No  . Sexual activity: Not Currently  Other Topics Concern  . Not  on file  Social History Narrative   No smoking; no alcohol; in [place]; worked in Designer, fashion/clothing. Lives by self in [place]. Does all of her own housework. Dtr does food shopping for her.   Social Determinants of Health   Financial Resource Strain: Not on file  Food Insecurity: Not on file  Transportation Needs: Not on file  Physical Activity: Not on file  Stress: Not on file  Social Connections: Not on file    Outpatient Encounter Medications as of 04/01/2021  Medication  Sig  . ADVAIR DISKUS 250-50 MCG/DOSE AEPB INHALE ONE PUFF BY MOUTH EVERY 12 HOURS. RINSE MOUTH AFTER EACH USE (Patient taking differently: Inhale 1 puff into the lungs 2 (two) times daily.)  . albuterol (PROVENTIL) (2.5 MG/3ML) 0.083% nebulizer solution USE 1 VIAL IN NEBULIZER EVERY 4 HOURS AS NEEDED FOR WHEEZING OR SHORTNESS OF BREATH  . alendronate (FOSAMAX) 70 MG tablet Take 70 mg by mouth once a week.   Marland Kitchen aspirin 81 MG tablet Take 81 mg by mouth daily.  Marland Kitchen atorvastatin (LIPITOR) 80 MG tablet Take 1 tablet (80 mg total) by mouth daily at 6 PM.  . cephALEXin (KEFLEX) 500 MG capsule Take 1 capsule (500 mg total) by mouth 2 (two) times daily. (Patient not taking: Reported on 04/01/2021)  . fluticasone (FLONASE) 50 MCG/ACT nasal spray Use 2 spray(s) in each nostril once daily (Patient taking differently: Place 2 sprays into both nostrils daily.)  . ibuprofen (ADVIL) 600 MG tablet Take 1 tablet (600 mg total) by mouth every 8 (eight) hours as needed for mild pain or moderate pain.  Marland Kitchen lisinopril (ZESTRIL) 20 MG tablet Take 1 tablet by mouth once daily (Patient taking differently: Take 20 mg by mouth daily.)  . metFORMIN (GLUCOPHAGE) 500 MG tablet TAKE 1 TABLET BY MOUTH TWICE DAILY WITH A MEAL (Patient taking differently: Take 500 mg by mouth 2 (two) times daily with a meal.)  . metoprolol tartrate (LOPRESSOR) 25 MG tablet Take 1 tablet by mouth twice daily (Patient taking differently: Take 25 mg by mouth 2 (two) times daily.)  . nitroGLYCERIN (NITROSTAT) 0.4 MG SL tablet Place 1 tablet (0.4 mg total) under the tongue every 5 (five) minutes as needed for chest pain.  Marland Kitchen PROAIR HFA 108 (90 Base) MCG/ACT inhaler Inhale 2 puffs into the lungs every 4 (four) hours as needed for wheezing or shortness of breath.  . Vitamin D, Ergocalciferol, (DRISDOL) 50000 units CAPS capsule Take 50,000 Units by mouth once a week.   No facility-administered encounter medications on file as of 04/01/2021.    Review of Systems      Objective:    Physical Exam  Ht 5\' 1"  (1.549 m)   Wt 118 lb (53.5 kg)   BMI 22.30 kg/m  Wt Readings from Last 3 Encounters:  04/01/21 118 lb (53.5 kg)  03/18/21 118 lb (53.5 kg)  03/04/21 118 lb (53.5 kg)     Lab Results  Component Value Date   WBC 9.7 03/31/2021   HGB 13.1 03/31/2021   HCT 39.8 03/31/2021   PLT 308.0 03/31/2021   GLUCOSE 152 (H) 03/31/2021   CHOL 207 (H) 03/31/2021   TRIG 192.0 (H) 03/31/2021   HDL 48.50 03/31/2021   LDLDIRECT 136.0 07/03/2020   LDLCALC 120 (H) 03/31/2021   ALT 10 03/31/2021   AST 11 03/31/2021   NA 142 03/31/2021   K 3.9 03/31/2021   CL 103 03/31/2021   CREATININE 0.88 03/31/2021   BUN 21 03/31/2021   CO2 28  03/31/2021   TSH 3.56 03/31/2021   INR 1.1 08/07/2020   HGBA1C 7.3 (H) 03/31/2021   MICROALBUR 3.1 (H) 07/03/2020       Assessment & Plan:   Problem List Items Addressed This Visit   None      Dale [place], MD

## 2021-04-03 ENCOUNTER — Other Ambulatory Visit: Payer: Self-pay | Admitting: Physician Assistant

## 2021-04-05 ENCOUNTER — Telehealth: Payer: Self-pay | Admitting: Physician Assistant

## 2021-04-05 NOTE — Telephone Encounter (Signed)
  Patient Consent for Virtual Visit         [name] has provided verbal consent on 04/05/2021 for a virtual visit (video or telephone).   CONSENT FOR VIRTUAL VISIT FOR:  Tanya Cole  By participating in this virtual visit I agree to the following:  I hereby voluntarily request, consent and authorize [facility] and its employed or contracted physicians, Producer, television/film/video, nurse practitioners or other licensed health care professionals (the Practitioner), to provide me with telemedicine health care services (the "Services") as deemed necessary by the treating Practitioner. I acknowledge and consent to receive the Services by the Practitioner via telemedicine. I understand that the telemedicine visit will involve communicating with the Practitioner through live audiovisual communication technology and the disclosure of certain medical information by electronic transmission. I acknowledge that I have been given the opportunity to request an in-person assessment or other available alternative prior to the telemedicine visit and am voluntarily participating in the telemedicine visit.  I understand that I have the right to withhold or withdraw my consent to the use of telemedicine in the course of my care at any time, without affecting my right to future care or treatment, and that the Practitioner or I may terminate the telemedicine visit at any time. I understand that I have the right to inspect all information obtained and/or recorded in the course of the telemedicine visit and may receive copies of available information for a reasonable fee.  I understand that some of the potential risks of receiving the Services via telemedicine include:  Marland Kitchen Delay or interruption in medical evaluation due to technological equipment failure or disruption; . Information transmitted may not be sufficient (e.g. poor resolution of images) to allow for appropriate medical decision making by the  Practitioner; and/or  . In rare instances, security protocols could fail, causing a breach of personal health information.  Furthermore, I acknowledge that it is my responsibility to provide information about my medical history, conditions and care that is complete and accurate to the best of my ability. I acknowledge that Practitioner's advice, recommendations, and/or decision may be based on factors not within their control, such as incomplete or inaccurate data provided by me or distortions of diagnostic images or specimens that may result from electronic transmissions. I understand that the practice of medicine is not an exact science and that Practitioner makes no warranties or guarantees regarding treatment outcomes. I acknowledge that a copy of this consent can be made available to me via my patient portal [facility] MyChart), or I can request a printed copy by calling the office of [facility].    I understand that my insurance will be billed for this visit.   I have read or had this consent read to me. . I understand the contents of this consent, which adequately explains the benefits and risks of the Services being provided via telemedicine.  . I have been provided ample opportunity to ask questions regarding this consent and the Services and have had my questions answered to my satisfaction. . I give my informed consent for the services to be provided through the use of telemedicine in my medical care

## 2021-04-05 NOTE — Progress Notes (Signed)
Virtual Visit via Telephone Note   This visit type was conducted due to national recommendations for restrictions regarding the COVID-19 Pandemic (e.g. social distancing) in an effort to limit this patient's exposure and mitigate transmission in our community.  Due to her co-morbid illnesses, this patient is at least at moderate risk for complications without adequate follow up.  This format is felt to be most appropriate for this patient at this time.  The patient did not have access to video technology/had technical difficulties with video requiring transitioning to audio format only (telephone).  All issues noted in this document were discussed and addressed.  No physical exam could be performed with this format.  Please refer to the patient's chart for her  consent to telehealth for [facility].    Date:  04/06/2021   ID:  Tanya Cole, Tanya Cole November 30, 1932, MRN [id number] The patient was identified using 2 identifiers.  Patient Location: Home Provider Location: Office/Clinic   PCP:  Dale Saco, MD   [facility] Providers Cardiologist:  Lorine Bears, MD     Evaluation Performed:  Follow-Up Visit  Chief Complaint:  Follow up  History of Present Illness:    Tanya Cole is a 87 y.o. female with CAD, atrial tachycardia, Covid in 12/2020, COPD in the setting of extensive second-hand smoke exposure and worked in a Medical laboratory scientific officer for 29 years, DM, acute blood loss anemia, HTN, HLD, and osteoporosis who presents for follow-up of CAD and atrial tachycardia.  She was admitted in 09/2018 with an NSTEMI. LHC showed 95% mid LAD stenosis along with 60% distal LAD stenosis. She underwent successful PCI/DES to the mid LAD. Echo at that time showed an EF of 55-60%, probably hypokinesis of the mid-apicalanteroseptal myocardium, Gr1DD, mild mitral regurgitation, and a mildly dilated left atrium. She was diagnosed with acute blood loss anemia in 2021, leading to the discontinuation of  Plavix. Outpatient cardiac monitoring in 03/2020 showed NSR with an average heart rate of 75 bpm. There were 429 episodes of atrial tachycardia with the longest episode lasting 19 seconds with a rate of 133 bpm. Occasional PACs with a burden of 3.2% and rare PVCs with a burden of < 1%. She was readmitted in 07/2020 with a small NSTEMI. LHC showed significant 1-vessel CAD with patent proximal LAD stent with mild ISR, stable mid LAD stenosis at 60%, and significant distal LAD stenosis at 90% close to the apex. EF was normal with a mildly elevated LVEDP. The distal LAD was felt to be the culprit, but was not amenable to PCI given distal location. Echo at that time showed an EF of 60-65%, no RWMA, Gr2DD, RVSF normal with normal ventricular cavity size, mildly dilated left atrium, and mild mitral regurgitation. She returned to the ED several weeks later with chest pain and SOB with HS-Tn being normal. She was noted to have intermittent episodes of atrial tachycardia and frequent PACs. Her metoprolol was titrated. She was last seen in the office in 08/2020, and was doing reasonably well. Since we last saw her, she has had several ED visits/hospital admission as outlined below.    She was admitted in 09/2020 with AECOPD.   ED visit 10/2020 for AECOPD.   ED visit 12/2020 for Covid.   ED visit 12/2020 for AECOPD.  She was admitted from 5/2 through 5/3 with AECOPD and treated with bronchodilators, incentive spirometer, and steroids. She was weaned to room air prior to discharge. She was noted to have evidence of calcified granuloma in  the left lung base on CXR.   Most recently, she was seen in the ED on 03/18/2021, with generalized weakness for 3 days and a 24 hour history of generalized arthralgias. She indicated this had previously occurred with noted spontaneous resolution. Vitals were stable. EKG showed NSR, 82 bpm, nonspecific st/t changes. She was advised to take ibuprofen and follow up with her PCP.   She had  a virtual visit with her PCP on 6/2 and was prescribed prednisone.   She is doing very well from a cardiac perspective.  No chest pain, dyspnea, palpitations, dizziness, presyncope, syncope, lower extremity swelling, abdominal distention, or early satiety.  She has stable two-pillow orthopnea.  She feels like the prednisone has helped her generalized arthralgias significantly.  She does not have any issues or concerns at this time.  Of note, her recent LDL obtained earlier this month was noted to be 120 with goal being less than 70.  In 07/2020 LDL was 37.  She is on atorvastatin 80 mg daily and denies missing any doses.  No significant dietary changes.   Labs independently reviewed by me: 03/2021 - HGB 13.1, PLT 308, A1c 7.3, albumin 4.4, AST/ALT normal, TC 207, TG 192, HDL 48, LDL 120, TSH normal, sodium 142, potassium 3.9, BUN 21, SCr 0.88  The patient does not have symptoms concerning for COVID-19 infection (fever, chills, cough, or new shortness of breath).    Past Medical History:  Diagnosis Date  . Aortic insufficiency    a. TTE 12/19: EF 55-60%, probable HK of the mid apical anterior septal myocardium, Gr1DD, mild AI, mildly dilated LA  . Asthma   . CAD (coronary artery disease)    a. NSTEMI 12/19; b. LHC 10/08/18: LM minimal luminal irregs, mLAD-1 95% s/p PCI/DES, mLAD-2 60%, LCx mild diffuse disease throughout, RCA minimal luminal irregs; b. 03/2020 MV: EF>65%, no ischemia/scar.  . CHF (congestive heart failure) (HCC)   . Diabetes mellitus (HCC)   . Hypercholesterolemia   . Hypertension   . Myocardial infarction (HCC)   . Osteopenia   . Palpitations    a. 04/2020 Zio: Avg HR 75. 429 SVT episodes, longest 19 secs @ 133. Occas PACs (3.2%). Rare PVCs (<1%).  . Polymyalgia rheumatica syndrome (HCC)   . Reactive airway disease    Past Surgical History:  Procedure Laterality Date  . ABDOMINAL HYSTERECTOMY  1981   prolapse and bleeding, ovaries not removed  . BREAST EXCISIONAL BIOPSY  Right   . CHOLECYSTECTOMY N/A 09/02/2019   Procedure: LAPAROSCOPIC CHOLECYSTECTOMY WITH INTRAOPERATIVE CHOLANGIOGRAM;  Surgeon: Henrene Dodge, MD;  Location: [facility] ORS;  Service: General;  Laterality: N/A;  . CORONARY STENT INTERVENTION N/A 10/08/2018   Procedure: CORONARY STENT INTERVENTION;  Surgeon: Iran Ouch, MD;  Location: [facility] INV[facility] LAB;  Service: Cardiovascular;  Laterality: N/A;  . ENDOSCOPIC RETROGRADE CHOLANGIOPANCREATOGRAPHY (ERCP) WITH PROPOFOL N/A 08/08/2019   Procedure: ENDOSCOPIC RETROGRADE CHOLANGIOPANCREATOGRAPHY (ERCP) WITH PROPOFOL;  Surgeon: Midge Minium, MD;  Location: [facility] ENDOSCOPY;  Service: Endoscopy;  Laterality: N/A;  . LEFT HEART CATH AND CORONARY ANGIOGRAPHY N/A 10/08/2018   Procedure: LEFT HEART CATH AND CORONARY ANGIOGRAPHY;  Surgeon: Iran Ouch, MD;  Location: [facility] LAB;  Service: Cardiovascular;  Laterality: N/A;  . LEFT HEART CATH AND CORONARY ANGIOGRAPHY N/A 08/10/2020   Procedure: LEFT HEART CATH AND CORONARY ANGIOGRAPHY possible percutaneous intervention;  Surgeon: Iran Ouch, MD;  Location: [facility] LAB;  Service: Cardiovascular;  Laterality: N/A;  . UMBILICAL HERNIA REPAIR  7/94  Current Meds  Medication Sig  . ADVAIR DISKUS 250-50 MCG/DOSE AEPB INHALE ONE PUFF BY MOUTH EVERY 12 HOURS. RINSE MOUTH AFTER EACH USE  . albuterol (PROVENTIL) (2.5 MG/3ML) 0.083% nebulizer solution USE 1 VIAL IN NEBULIZER EVERY 4 HOURS AS NEEDED FOR WHEEZING OR SHORTNESS OF BREATH  . alendronate (FOSAMAX) 70 MG tablet Take 70 mg by mouth once a week.   Marland Kitchen aspirin 81 MG tablet Take 81 mg by mouth daily.  Marland Kitchen atorvastatin (LIPITOR) 80 MG tablet Take 1 tablet (80 mg total) by mouth daily at 6 PM.  . fluticasone (FLONASE) 50 MCG/ACT nasal spray Use 2 spray(s) in each nostril once daily  . ibuprofen (ADVIL) 600 MG tablet Take 1 tablet (600 mg total) by mouth every 8 (eight) hours as needed for mild pain or moderate pain.  Marland Kitchen lisinopril  (ZESTRIL) 20 MG tablet Take 1 tablet by mouth once daily  . metFORMIN (GLUCOPHAGE) 500 MG tablet TAKE 1 TABLET BY MOUTH TWICE DAILY WITH A MEAL  . metoprolol tartrate (LOPRESSOR) 25 MG tablet Take 1 tablet by mouth twice daily  . nitroGLYCERIN (NITROSTAT) 0.4 MG SL tablet Place 1 tablet (0.4 mg total) under the tongue every 5 (five) minutes as needed for chest pain.  . predniSONE (DELTASONE) 10 MG tablet Take 4 tablets x 1 day and then decrease by 1/2 tablet per day until down to zero mg.  . PROAIR HFA 108 (90 Base) MCG/ACT inhaler Inhale 2 puffs into the lungs every 4 (four) hours as needed for wheezing or shortness of breath.  . Vitamin D, Ergocalciferol, (DRISDOL) 50000 units CAPS capsule Take 50,000 Units by mouth once a week.     Allergies:   Tramadol   Social History   Tobacco Use  . Smoking status: Never Smoker  . Smokeless tobacco: Never Used  Vaping Use  . Vaping Use: Never used  Substance Use Topics  . Alcohol use: No    Alcohol/week: 0.0 standard drinks  . Drug use: No     Family Hx: The patient's family history includes Arthritis in her mother; COPD in her brother; Heart attack in her father; Heart disease in her mother; Parkinson's disease in her sister; Throat cancer in her sister.  ROS:   Please see the history of present illness.     All other systems reviewed and are negative.   Prior CV studies:   The following studies were reviewed today:  As above.  Labs/Other Tests and Data Reviewed:    EKG:  An ECG dated 03/18/2021 was reviewed as above.   Recent Labs: 11/29/2020: B Natriuretic Peptide 67.5 03/02/2021: Magnesium 1.6 03/31/2021: ALT 10; BUN 21; Creatinine, Ser 0.88; Hemoglobin 13.1; Platelets 308.0; Potassium 3.9; Sodium 142; TSH 3.56   Recent Lipid Panel Lab Results  Component Value Date/Time   CHOL 207 (H) 03/31/2021 08:40 AM   TRIG 192.0 (H) 03/31/2021 08:40 AM   HDL 48.50 03/31/2021 08:40 AM   CHOLHDL 4 03/31/2021 08:40 AM   LDLCALC 120 (H)  03/31/2021 08:40 AM   LDLDIRECT 136.0 07/03/2020 11:39 AM    Wt Readings from Last 3 Encounters:  04/06/21 118 lb (53.5 kg)  04/01/21 118 lb (53.5 kg)  03/18/21 118 lb (53.5 kg)     Risk Assessment/Calculations:      Objective:    Vital Signs:  BP 140/85   Ht 5\' 1"  (1.549 m)   Wt 118 lb (53.5 kg)   BMI 22.30 kg/m    VITAL SIGNS:  reviewed  ASSESSMENT &  PLAN:    1. CAD involving the native coronary arteries without angina: She is doing well without any symptoms concerning for angina.  Continue current medications and secondary prevention including aspirin 81 mg, atorvastatin 80 mg, lisinopril 20 mg, Lopressor 25 mg twice daily, and as needed SL NTG.  No indication for further ischemic testing at this time.  2. Atrial tachycardia: Quiescent.  She remains on Lopressor 25 mg twice daily.  3. HTN: Blood pressure has been well controlled at home.  She does not have specifics for review.  She remains on lisinopril and Lopressor as outlined above.  Low-sodium diet recommended.  4. HLD: LDL 120 and 03/2021.  Previously her LDL was well controlled when checked in 07/2020 with a value of 37.  Goal LDL less than 70.  She reports adherence to atorvastatin 80 mg daily and denies any significant dietary changes.  We will place a future order for her to have a fasting lipid panel drawn at her convenience either through her PCP or our office at her next visit.  Further recommendations based on repeat lipid panel.     COVID-19 Education: The signs and symptoms of COVID-19 were discussed with the patient and how to seek care for testing (follow up with PCP or arrange E-visit).  The importance of social distancing was discussed today.  Time:   Today, I have spent 8 minutes with the patient with telehealth technology discussing the above problems.     Medication Adjustments/Labs and Tests Ordered: Current medicines are reviewed at length with the patient today.  Concerns regarding medicines are  outlined above.   Tests Ordered: Orders Placed This Encounter  Procedures  . Lipid Profile    Medication Changes: No orders of the defined types were placed in this encounter.   Follow Up:  In Person in 6 month(s)  Signed, Eula Listen, PA-C  04/06/2021 9:45 AM    [facility]

## 2021-04-05 NOTE — Telephone Encounter (Signed)
Scheduled for 6/7

## 2021-04-05 NOTE — Telephone Encounter (Signed)
Please schedule overdue F/U appointment. Thank you! ?

## 2021-04-06 ENCOUNTER — Encounter: Payer: Self-pay | Admitting: Physician Assistant

## 2021-04-06 ENCOUNTER — Telehealth (INDEPENDENT_AMBULATORY_CARE_PROVIDER_SITE_OTHER): Payer: 59 | Admitting: Physician Assistant

## 2021-04-06 ENCOUNTER — Other Ambulatory Visit: Payer: Self-pay

## 2021-04-06 VITALS — BP 140/85 | Ht 61.0 in | Wt 118.0 lb

## 2021-04-06 DIAGNOSIS — E785 Hyperlipidemia, unspecified: Secondary | ICD-10-CM

## 2021-04-06 DIAGNOSIS — Z79899 Other long term (current) drug therapy: Secondary | ICD-10-CM

## 2021-04-06 DIAGNOSIS — I10 Essential (primary) hypertension: Secondary | ICD-10-CM | POA: Diagnosis not present

## 2021-04-06 DIAGNOSIS — I2510 Atherosclerotic heart disease of native coronary artery without angina pectoris: Secondary | ICD-10-CM | POA: Diagnosis not present

## 2021-04-06 DIAGNOSIS — I4719 Other supraventricular tachycardia: Secondary | ICD-10-CM

## 2021-04-06 DIAGNOSIS — I471 Supraventricular tachycardia: Secondary | ICD-10-CM | POA: Diagnosis not present

## 2021-04-06 NOTE — Patient Instructions (Signed)
Medication Instructions:  - Your physician recommends that you continue on your current medications as directed. Please refer to the Current Medication list given to you today.  *If you need a refill on your cardiac medications before your next appointment, please call your pharmacy*   Lab Work: - Your physician recommends that you return for a FASTING lipid profile: at your convenience  You may go to the: Medical Mall Entrance at [facility] (on a walk in basis) 1st desk on the right to check in, past the screening table Lab hours: Monday- Friday (7:30 am- 5:30 pm)  Do not have anything to eat/ drink for 8 hours prior to your lab draw except for water/ black coffee (no creams/ sugar).   Your orders are already placed so they know what to draw for [place].   If you have labs (blood work) drawn today and your tests are completely normal, you will receive your results only by: Marland Kitchen MyChart Message (if you have MyChart) OR . A paper copy in the mail If you have any lab test that is abnormal or we need to change your treatment, we will call you to review the results.   Testing/Procedures: - none ordered   Follow-Up: At [facility], you and your health needs are our priority.  As part of our continuing mission to provide you with exceptional heart care, we have created designated Provider Care Teams.  These Care Teams include your primary Cardiologist (physician) and Advanced Practice Providers (APPs -  Physician Assistants and Nurse Practitioners) who all work together to provide you with the care you need, when you need it.  We recommend signing up for the patient portal called "MyChart".  Sign up information is provided on this After Visit Summary.  MyChart is used to connect with patients for Virtual Visits (Telemedicine).  Patients are able to view lab/test results, encounter notes, upcoming appointments, etc.  Non-urgent messages can be sent to your provider as well.   To learn more about what  you can do with MyChart, go to [email or web address].    Your next appointment:   6 month(s)  The format for your next appointment:   In Person  Provider:   You may see Lorine Bears, MD or one of the following Advanced Practice Providers on your designated Care Team:    Nicolasa Ducking, NP  Eula Listen, PA-C  Marisue Ivan, PA-C  Cadence [place], [place]  Gillian Shields, NP    Other Instructions n/a

## 2021-04-09 ENCOUNTER — Encounter: Payer: Self-pay | Admitting: Internal Medicine

## 2021-04-09 DIAGNOSIS — M2550 Pain in unspecified joint: Secondary | ICD-10-CM | POA: Insufficient documentation

## 2021-04-09 NOTE — Assessment & Plan Note (Signed)
Low carb diet and exercise.  Discussed sugar elevation with prednisone.  Follow sugars.  Follow met b and a1c.

## 2021-04-09 NOTE — Assessment & Plan Note (Signed)
Known CAD s/p PCI/DES - mid LAD.  Continue metoprolol, imdur and lisinopril.  No chest pain.  Breathing at baseline.  Follow.

## 2021-04-09 NOTE — Assessment & Plan Note (Signed)
Continue lisinopril, imdur and metoprolol.  Follow pressures.  Follow metabolic panel

## 2021-04-09 NOTE — Assessment & Plan Note (Signed)
With increased cough and wheezing as outlined.  No chest tightness.  Hold abx.  Treat with prednisone taper as directed.  advair and rescue inhaler (or nebs) if needed.  Follow closely.  Call with update.

## 2021-04-09 NOTE — Progress Notes (Signed)
[name], female   DOB: [birth year], 87 y.o.   MRN: [id number]   Virtual Visit via telephone Note  This visit type was conducted due to national recommendations for restrictions regarding the COVID-19 pandemic (e.g. social distancing).  This format is felt to be most appropriate for this patient at this time.  All issues noted in this document were discussed and addressed.  No physical exam was performed (except for noted visual exam findings with Video Visits).   I connected with [name] by telephone and verified that I am speaking with the correct person using two identifiers. Location patient: home Location provider: work  Persons participating in the virtual visit: patient, provider  The limitations, risks, security and privacy concerns of performing an evaluation and management service by telephone and the availability of in person appointments have been discussed.  It has also been discussed with the patient that there may be a patient responsible charge related to this service. The patient expressed understanding and agreed to proceed.   Reason for visit:  ER follow up.   HPI: Patient here for ED follow up.  Was seen in ER 03/18/21 - for weakness and generalized arthralgias.  Lower extremity discomfort - limited ambulation.  She reported to me was sob then as well.  Was doing better.  Joint aches/arthralgias resolved.  Did notice this am - some increased cough and wheezing.  Used neb this am.  Helped.  No fever.  No chest tightness.  Eating and drinking.  Using advair bid.  Discussed using neb and preventative inhaler.  Neb opened her up some.  No chest tightness or pain.  Some cough.  No acid reflux.  No nausea or vomiting.  Bowels moving.     ROS: See pertinent positives and negatives per HPI.  Past Medical History:  Diagnosis Date   Aortic insufficiency    a. TTE 12/19: EF 55-60%, probable HK of the mid apical anterior septal myocardium, Gr1DD, mild AI,  mildly dilated LA   Asthma    CAD (coronary artery disease)    a. NSTEMI 12/19; b. LHC 10/08/18: LM minimal luminal irregs, mLAD-1 95% s/p PCI/DES, mLAD-2 60%, LCx mild diffuse disease throughout, RCA minimal luminal irregs; b. 03/2020 MV: EF>65%, no ischemia/scar.   CHF (congestive heart failure) ([place])    Diabetes mellitus ([place])    Hypercholesterolemia    Hypertension    Myocardial infarction ([place])    Osteopenia    Palpitations    a. 04/2020 Zio: Avg HR 75. 429 SVT episodes, longest 19 secs @ 133. Occas PACs (3.2%). Rare PVCs (<1%).   Polymyalgia rheumatica syndrome ([place])    Reactive airway disease     Past Surgical History:  Procedure Laterality Date   ABDOMINAL HYSTERECTOMY  1981   prolapse and bleeding, ovaries not removed   BREAST EXCISIONAL BIOPSY Right    CHOLECYSTECTOMY N/A 09/02/2019   Procedure: LAPAROSCOPIC CHOLECYSTECTOMY WITH INTRAOPERATIVE CHOLANGIOGRAM;  Surgeon: Olean Ree, MD;  Location: [facility] ORS;  Service: General;  Laterality: N/A;   CORONARY STENT INTERVENTION N/A 10/08/2018   Procedure: CORONARY STENT INTERVENTION;  Surgeon: Wellington Hampshire, MD;  Location: [place] CV LAB;  Service: Cardiovascular;  Laterality: N/A;   ENDOSCOPIC RETROGRADE CHOLANGIOPANCREATOGRAPHY (ERCP) WITH PROPOFOL N/A 08/08/2019   Procedure: ENDOSCOPIC RETROGRADE CHOLANGIOPANCREATOGRAPHY (ERCP) WITH PROPOFOL;  Surgeon: Lucilla Lame, MD;  Location: [facility] ENDOSCOPY;  Service: Endoscopy;  Laterality: N/A;   LEFT HEART CATH AND CORONARY ANGIOGRAPHY N/A 10/08/2018   Procedure: LEFT HEART CATH  AND CORONARY ANGIOGRAPHY;  Surgeon: Wellington Hampshire, MD;  Location: [place] CV LAB;  Service: Cardiovascular;  Laterality: N/A;   LEFT HEART CATH AND CORONARY ANGIOGRAPHY N/A 08/10/2020   Procedure: LEFT HEART CATH AND CORONARY ANGIOGRAPHY possible percutaneous intervention;  Surgeon: Wellington Hampshire, MD;  Location: [place] CV LAB;  Service: Cardiovascular;  Laterality: N/A;   UMBILICAL HERNIA  REPAIR  7/94    Family History  Problem Relation Age of Onset   Heart attack Father    Arthritis Mother    Heart disease Mother    Throat cancer Sister    Parkinson's disease Sister    COPD Brother     SOCIAL HX: reviewed.    Current Outpatient Medications:    predniSONE (DELTASONE) 10 MG tablet, Take 4 tablets x 1 day and then decrease by 1/2 tablet per day until down to zero mg., Disp: 18 tablet, Rfl: 0   ADVAIR DISKUS 250-50 MCG/DOSE AEPB, INHALE ONE PUFF BY MOUTH EVERY 12 HOURS. RINSE MOUTH AFTER EACH USE, Disp: 60 each, Rfl: 5   albuterol (PROVENTIL) (2.5 MG/3ML) 0.083% nebulizer solution, USE 1 VIAL IN NEBULIZER EVERY 4 HOURS AS NEEDED FOR WHEEZING OR SHORTNESS OF BREATH, Disp: 180 mL, Rfl: 0   alendronate (FOSAMAX) 70 MG tablet, Take 70 mg by mouth once a week. , Disp: , Rfl:    aspirin 81 MG tablet, Take 81 mg by mouth daily., Disp: , Rfl:    atorvastatin (LIPITOR) 80 MG tablet, Take 1 tablet (80 mg total) by mouth daily at 6 PM., Disp: 90 tablet, Rfl: 3   fluticasone (FLONASE) 50 MCG/ACT nasal spray, Use 2 spray(s) in each nostril once daily, Disp: 16 g, Rfl: 0   ibuprofen (ADVIL) 600 MG tablet, Take 1 tablet (600 mg total) by mouth every 8 (eight) hours as needed for mild pain or moderate pain., Disp: 15 tablet, Rfl: 0   lisinopril (ZESTRIL) 20 MG tablet, Take 1 tablet by mouth once daily, Disp: 90 tablet, Rfl: 0   metFORMIN (GLUCOPHAGE) 500 MG tablet, TAKE 1 TABLET BY MOUTH TWICE DAILY WITH A MEAL, Disp: 180 tablet, Rfl: 0   metoprolol tartrate (LOPRESSOR) 25 MG tablet, Take 1 tablet by mouth twice daily, Disp: 90 tablet, Rfl: 0   nitroGLYCERIN (NITROSTAT) 0.4 MG SL tablet, Place 1 tablet (0.4 mg total) under the tongue every 5 (five) minutes as needed for chest pain., Disp: 30 tablet, Rfl: 12   PROAIR HFA 108 (90 Base) MCG/ACT inhaler, Inhale 2 puffs into the lungs every 4 (four) hours as needed for wheezing or shortness of breath., Disp: 18 g, Rfl: 0   Vitamin D,  Ergocalciferol, (DRISDOL) 50000 units CAPS capsule, Take 50,000 Units by mouth once a week., Disp: , Rfl: 3  EXAM:  GENERAL: alert. Sounds to be in no acute distress.  Answering questions appropriately.   PSYCH/NEURO: pleasant and cooperative, no obvious depression or anxiety, speech and thought processing grossly intact  ASSESSMENT AND PLAN:  Discussed the following assessment and plan:  Problem List Items Addressed This Visit     Chronic diastolic CHF (congestive heart failure) (HCC)    Continue lisinopril, imdur and metoprolol.  Breathing at baseline now.  Follow.        COPD exacerbation ([place])    With increased cough and wheezing as outlined.  No chest tightness.  Hold abx.  Treat with prednisone taper as directed.  advair and rescue inhaler (or nebs) if needed.  Follow closely.  Call with update.  Relevant Medications   predniSONE (DELTASONE) 10 MG tablet   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)    Known CAD s/p PCI/DES - mid LAD.  Continue metoprolol, imdur and lisinopril.  No chest pain.  Breathing at baseline.  Follow.        Relevant Medications   predniSONE (DELTASONE) 10 MG tablet   Hypertension    Continue lisinopril, imdur and metoprolol.  Follow pressures.  Follow metabolic panel       Joint ache   Type 2 diabetes mellitus with cardiac complication (HCC)    Low carb diet and exercise.  Discussed sugar elevation with prednisone.  Follow sugars.  Follow met b and a1c.         Return if symptoms worsen or fail to improve, for keep scheduled.   I discussed the assessment and treatment plan with the patient. The patient was provided an opportunity to ask questions and all were answered. The patient agreed with the plan and demonstrated an understanding of the instructions.   The patient was advised to call back or seek an in-person evaluation if the symptoms worsen or if the condition fails to improve as  anticipated.  I provided 23 minutes of non-face-to-face time during this encounter.   Einar Pheasant, MD t

## 2021-04-09 NOTE — Assessment & Plan Note (Signed)
Continue lisinopril, imdur and metoprolol.  Breathing at baseline now.  Follow.  

## 2021-04-21 ENCOUNTER — Emergency Department
Admission: EM | Admit: 2021-04-21 | Discharge: 2021-04-21 | Disposition: A | Discharge status: Home / Self Care | Payer: 59 | Attending: Emergency Medicine | Admitting: Emergency Medicine

## 2021-04-21 ENCOUNTER — Other Ambulatory Visit: Payer: Self-pay

## 2021-04-21 ENCOUNTER — Emergency Department: Payer: 59

## 2021-04-21 ENCOUNTER — Telehealth: Payer: Self-pay | Admitting: Internal Medicine

## 2021-04-21 DIAGNOSIS — I5032 Chronic diastolic (congestive) heart failure: Secondary | ICD-10-CM | POA: Diagnosis not present

## 2021-04-21 DIAGNOSIS — I25119 Atherosclerotic heart disease of native coronary artery with unspecified angina pectoris: Secondary | ICD-10-CM | POA: Insufficient documentation

## 2021-04-21 DIAGNOSIS — Z7982 Long term (current) use of aspirin: Secondary | ICD-10-CM | POA: Insufficient documentation

## 2021-04-21 DIAGNOSIS — J441 Chronic obstructive pulmonary disease with (acute) exacerbation: Secondary | ICD-10-CM | POA: Diagnosis not present

## 2021-04-21 DIAGNOSIS — Z7951 Long term (current) use of inhaled steroids: Secondary | ICD-10-CM | POA: Insufficient documentation

## 2021-04-21 DIAGNOSIS — I110 Hypertensive heart disease with heart failure: Secondary | ICD-10-CM | POA: Diagnosis not present

## 2021-04-21 DIAGNOSIS — Z79899 Other long term (current) drug therapy: Secondary | ICD-10-CM | POA: Insufficient documentation

## 2021-04-21 DIAGNOSIS — E1159 Type 2 diabetes mellitus with other circulatory complications: Secondary | ICD-10-CM | POA: Diagnosis not present

## 2021-04-21 DIAGNOSIS — R079 Chest pain, unspecified: Secondary | ICD-10-CM

## 2021-04-21 DIAGNOSIS — Z7984 Long term (current) use of oral hypoglycemic drugs: Secondary | ICD-10-CM | POA: Insufficient documentation

## 2021-04-21 DIAGNOSIS — J45909 Unspecified asthma, uncomplicated: Secondary | ICD-10-CM | POA: Insufficient documentation

## 2021-04-21 DIAGNOSIS — Z955 Presence of coronary angioplasty implant and graft: Secondary | ICD-10-CM | POA: Diagnosis not present

## 2021-04-21 DIAGNOSIS — R0602 Shortness of breath: Secondary | ICD-10-CM | POA: Diagnosis present

## 2021-04-21 LAB — CBC WITH DIFFERENTIAL/PLATELET
Abs Immature Granulocytes: 0.02 10*3/uL (ref 0.00–0.07)
Basophils Absolute: 0 10*3/uL (ref 0.0–0.1)
Basophils Relative: 1 %
Eosinophils Absolute: 0.7 10*3/uL — ABNORMAL HIGH (ref 0.0–0.5)
Eosinophils Relative: 8 %
HCT: 40.3 % (ref 36.0–46.0)
Hemoglobin: 13.5 g/dL (ref 12.0–15.0)
Immature Granulocytes: 0 %
Lymphocytes Relative: 19 %
Lymphs Abs: 1.6 10*3/uL (ref 0.7–4.0)
MCH: 29.3 pg (ref 26.0–34.0)
MCHC: 33.5 g/dL (ref 30.0–36.0)
MCV: 87.6 fL (ref 80.0–100.0)
Monocytes Absolute: 0.6 10*3/uL (ref 0.1–1.0)
Monocytes Relative: 7 %
Neutro Abs: 5.6 10*3/uL (ref 1.7–7.7)
Neutrophils Relative %: 65 %
Platelets: 228 10*3/uL (ref 150–400)
RBC: 4.6 MIL/uL (ref 3.87–5.11)
RDW: 13.2 % (ref 11.5–15.5)
WBC: 8.5 10*3/uL (ref 4.0–10.5)
nRBC: 0 % (ref 0.0–0.2)

## 2021-04-21 LAB — TROPONIN I (HIGH SENSITIVITY)
Troponin I (High Sensitivity): 6 ng/L (ref <18)
Troponin I (High Sensitivity): 6 ng/L (ref <18)

## 2021-04-21 LAB — BRAIN NATRIURETIC PEPTIDE: B Natriuretic Peptide: 157.6 pg/mL — ABNORMAL HIGH (ref 0.0–100.0)

## 2021-04-21 LAB — BASIC METABOLIC PANEL
Anion gap: 6 (ref 5–15)
BUN: 11 mg/dL (ref 8–23)
CO2: 28 mmol/L (ref 22–32)
Calcium: 9.1 mg/dL (ref 8.9–10.3)
Chloride: 106 mmol/L (ref 98–111)
Creatinine, Ser: 0.56 mg/dL (ref 0.44–1.00)
GFR, Estimated: >60 mL/min (ref >60)
Glucose, Bld: 159 mg/dL — ABNORMAL HIGH (ref 70–99)
Potassium: 4 mmol/L (ref 3.5–5.1)
Sodium: 140 mmol/L (ref 135–145)

## 2021-04-21 MED ORDER — IPRATROPIUM-ALBUTEROL 0.5-2.5 (3) MG/3ML IN SOLN
3.0000 mL | Freq: Once | RESPIRATORY_TRACT | Status: AC
  Administered 2021-04-21: 3 mL via RESPIRATORY_TRACT
  Filled 2021-04-21: qty 3

## 2021-04-21 MED ORDER — PREDNISONE 20 MG PO TABS
60.0000 mg | ORAL_TABLET | Freq: Once | ORAL | Status: AC
  Administered 2021-04-21: 60 mg via ORAL
  Filled 2021-04-21: qty 3

## 2021-04-21 MED ORDER — PREDNISONE 20 MG PO TABS: 60.0000 mg | ORAL_TABLET | Freq: Every day | ORAL | 0 refills | Status: AC

## 2021-04-21 NOTE — ED Triage Notes (Signed)
Pt came from home with difficulty breathing at 7am; pt had two duonebs with relief; pt presents with inspiratory wheeze and some crackles in lungs.

## 2021-04-21 NOTE — Telephone Encounter (Signed)
Attempted to leave message for patient to call back and schedule Medicare Annual Wellness Visit (AWV) in office, but no voice mail.   If not able to come in office, please offer to do virtually or by telephone.   Last AWV:01/09/2020  Please schedule at anytime with Nurse Health Advisor.

## 2021-04-21 NOTE — ED Provider Notes (Signed)
[facility] Emergency Department Provider Note   ____________________________________________   Event Date/Time   First MD Initiated Contact with Patient 04/21/21 0813     (approximate)  I have reviewed the triage vital signs and the nursing notes.   HISTORY  Chief Complaint Shortness of Breath    HPI Tanya Cole is a 87 y.o. female with past medical history of hypertension, hyperlipidemia, diabetes, diastolic CHF, and COPD who presents to the ED complaining of shortness of breath.  Patient reports that she had acute onset of difficulty breathing after waking up this morning.  She states this has been associated with a feeling of heaviness in her chest that has been constant since waking up.  She denies any fevers, cough, pain or swelling in her legs.  Symptoms initially felt like COPD and she had partial improvement after taking 2 DuoNeb's at home.  Patient not in any respiratory distress per EMS and maintaining O2 sats on room air.        Past Medical History:  Diagnosis Date   Aortic insufficiency    a. TTE 12/19: EF 55-60%, probable HK of the mid apical anterior septal myocardium, Gr1DD, mild AI, mildly dilated LA   Asthma    CAD (coronary artery disease)    a. NSTEMI 12/19; b. LHC 10/08/18: LM minimal luminal irregs, mLAD-1 95% s/p PCI/DES, mLAD-2 60%, LCx mild diffuse disease throughout, RCA minimal luminal irregs; b. 03/2020 MV: EF>65%, no ischemia/scar.   CHF (congestive heart failure) (HCC)    Diabetes mellitus (HCC)    Hypercholesterolemia    Hypertension    Myocardial infarction (HCC)    Osteopenia    Palpitations    a. 04/2020 Zio: Avg HR 75. 429 SVT episodes, longest 19 secs @ 133. Occas PACs (3.2%). Rare PVCs (<1%).   Polymyalgia rheumatica syndrome (HCC)    Reactive airway disease     Patient Active Problem List   Diagnosis Date Noted   Joint ache 04/09/2021   Calcified granuloma of lung (HCC) 03/02/2021   Acute exacerbation  of chronic obstructive pulmonary disease (COPD) (HCC) 03/01/2021   COPD exacerbation (HCC) 11/29/2020   Chronic diastolic CHF (congestive heart failure) (HCC) 11/29/2020   CAP (community acquired pneumonia) 11/29/2020   Rib pain on right side 10/19/2020   Abnormal CXR 10/19/2020   Type 2 diabetes mellitus with cardiac complication (HCC) 10/08/2020   Elevated troponin 10/08/2020   Acute on chronic heart failure with preserved ejection fraction (HFpEF) (HCC) 10/08/2020   COPD with acute exacerbation (HCC) 08/19/2020   History of non-ST elevation myocardial infarction (NSTEMI) 08/07/2020   Asthma 08/07/2020   Leukocytosis 08/07/2020   Left shoulder pain 07/12/2020   Iron deficiency 04/16/2020   Anemia 04/04/2020   SOB (shortness of breath) 03/25/2020   Cough 09/30/2019   Gallstone pancreatitis    Pre-op evaluation 08/30/2019   Cholecystitis 08/05/2019   Choledocholithiasis    Respiratory illness 06/18/2019   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) 12/29/2018   Chest pain 10/07/2018   Memory change 05/27/2018   Osteoporosis 02/05/2018   Weight loss 06/24/2017   Abdominal pain, left lower quadrant 10/05/2016   Long term current use of systemic steroids 05/16/2016   Elevated erythrocyte sedimentation rate 05/04/2016   Back pain 04/29/2016   Fatigue 05/24/2015   Health care maintenance 01/25/2015   UTI (urinary tract infection) 07/07/2014   Neuropathy 03/24/2014   Diverticulitis 02/24/2013   Reactive airway disease 09/15/2012   Hypertension  09/14/2012   Hypercholesterolemia 09/14/2012   Diabetes mellitus with cardiac complication (HCC) 09/14/2012    Past Surgical History:  Procedure Laterality Date   ABDOMINAL HYSTERECTOMY  1981   prolapse and bleeding, ovaries not removed   BREAST EXCISIONAL BIOPSY Right    CHOLECYSTECTOMY N/A 09/02/2019   Procedure: LAPAROSCOPIC CHOLECYSTECTOMY WITH INTRAOPERATIVE CHOLANGIOGRAM;  Surgeon: Henrene Dodge, MD;   Location: [facility] ORS;  Service: General;  Laterality: N/A;   CORONARY STENT INTERVENTION N/A 10/08/2018   Procedure: CORONARY STENT INTERVENTION;  Surgeon: Iran Ouch, MD;  Location: [facility] INVASIVE CV LAB;  Service: Cardiovascular;  Laterality: N/A;   ENDOSCOPIC RETROGRADE CHOLANGIOPANCREATOGRAPHY (ERCP) WITH PROPOFOL N/A 08/08/2019   Procedure: ENDOSCOPIC RETROGRADE CHOLANGIOPANCREATOGRAPHY (ERCP) WITH PROPOFOL;  Surgeon: Midge Minium, MD;  Location: [facility] ENDOSCOPY;  Service: Endoscopy;  Laterality: N/A;   LEFT HEART CATH AND CORONARY ANGIOGRAPHY N/A 10/08/2018   Procedure: LEFT HEART CATH AND CORONARY ANGIOGRAPHY;  Surgeon: Iran Ouch, MD;  Location: [facility];  Service: Cardiovascular;  Laterality: N/A;   LEFT HEART CATH AND CORONARY ANGIOGRAPHY N/A 08/10/2020   Procedure: LEFT HEART CATH AND CORONARY ANGIOGRAPHY possible percutaneous intervention;  Surgeon: Iran Ouch, MD;  Location: [facility] INV[facility] LAB;  Service: Cardiovascular;  Laterality: N/A;   UMBILICAL HERNIA REPAIR  7/94    Prior to Admission medications   Medication Sig Start Date End Date Taking? Authorizing Provider  ADVAIR DISKUS 250-50 MCG/DOSE AEPB INHALE ONE PUFF BY MOUTH EVERY 12 HOURS. RINSE MOUTH AFTER EACH USE 02/17/16  Yes Dale [place], MD  albuterol (PROVENTIL) (2.5 MG/3ML) 0.083% nebulizer solution USE 1 VIAL IN NEBULIZER EVERY 4 HOURS AS NEEDED FOR WHEEZING OR SHORTNESS OF BREATH 03/30/21  Yes Dale [place], MD  aspirin 81 MG tablet Take 81 mg by mouth daily.   Yes [provider]  atorvastatin (LIPITOR) 80 MG tablet Take 1 tablet (80 mg total) by mouth daily at 6 PM. 04/01/20  Yes Alver Sorrow, NP  fluticasone (FLONASE) 50 MCG/ACT nasal spray Use 2 spray(s) in each nostril once daily 08/03/20  Yes Dale Leon, MD  ibuprofen (ADVIL) 600 MG tablet Take 1 tablet (600 mg total) by mouth every 8 (eight) hours as needed for mild pain or moderate pain. 03/18/21  Yes Merwyn Katos,  MD  lisinopril (ZESTRIL) 20 MG tablet Take 1 tablet by mouth once daily 04/05/21  Yes Dunn, Ryan M, PA-C  metFORMIN (GLUCOPHAGE) 500 MG tablet TAKE 1 TABLET BY MOUTH TWICE DAILY WITH A MEAL 09/29/20  Yes Dale [place], MD  metoprolol tartrate (LOPRESSOR) 25 MG tablet Take 1 tablet by mouth twice daily 12/21/20  Yes Iran Ouch, MD  nitroGLYCERIN (NITROSTAT) 0.4 MG SL tablet Place 1 tablet (0.4 mg total) under the tongue every 5 (five) minutes as needed for chest pain. 10/09/18  Yes Mody, Patricia Pesa, MD  predniSONE (DELTASONE) 20 MG tablet Take 3 tablets (60 mg total) by mouth daily with breakfast for 5 days. 04/21/21 04/26/21 Yes Chesley Noon, MD  PROAIR HFA 108 [phone] Base) MCG/ACT inhaler Inhale 2 puffs into the lungs every 4 (four) hours as needed for wheezing or shortness of breath. 08/20/20  Yes Rolly Salter, MD  alendronate (FOSAMAX) 70 MG tablet Take 70 mg by mouth once a week.  11/06/17   [provider]  Vitamin D, Ergocalciferol, (DRISDOL) 50000 units CAPS capsule Take 50,000 Units by mouth once a week. 06/24/18   [provider]    Allergies Tramadol  Family History  Problem  Relation Age of Onset   Heart attack Father    Arthritis Mother    Heart disease Mother    Throat cancer Sister    Parkinson's disease Sister    COPD Brother     Social History Social History   Tobacco Use   Smoking status: Never   Smokeless tobacco: Never  Vaping Use   Vaping Use: Never used  Substance Use Topics   Alcohol use: No    Alcohol/week: 0.0 standard drinks   Drug use: No    Review of Systems  Constitutional: No fever/chills Eyes: No visual changes. ENT: No sore throat. Cardiovascular: Positive for chest pain. Respiratory: Positive for shortness of breath. Gastrointestinal: No abdominal pain.  No nausea, no vomiting.  No diarrhea.  No constipation. Genitourinary: Negative for dysuria. Musculoskeletal: Negative for back pain. Skin: Negative for  rash. Neurological: Negative for headaches, focal weakness or numbness.  ____________________________________________   PHYSICAL EXAM:  VITAL SIGNS: ED Triage Vitals  Enc Vitals Group     BP      Pulse      Resp      Temp      Temp src      SpO2      Weight      Height      Head Circumference      Peak Flow      Pain Score      Pain Loc      Pain Edu?      Excl. in GC?     Constitutional: Alert and oriented. Eyes: Conjunctivae are normal. Head: Atraumatic. Nose: No congestion/rhinnorhea. Mouth/Throat: Mucous membranes are moist. Neck: Normal ROM Cardiovascular: Normal rate, regular rhythm. Grossly normal heart sounds.  2+ radial pulses bilaterally. Respiratory: Mildly tachypneic with increased respiratory effort.  No retractions. Lungs with expiratory wheezes throughout. Gastrointestinal: Soft and nontender. No distention. Genitourinary: deferred Musculoskeletal: No lower extremity tenderness nor edema. Neurologic:  Normal speech and language. No gross focal neurologic deficits are appreciated. Skin:  Skin is warm, dry and intact. No rash noted. Psychiatric: Mood and affect are normal. Speech and behavior are normal.  ____________________________________________   LABS (all labs ordered are listed, but only abnormal results are displayed)  Labs Reviewed  CBC WITH DIFFERENTIAL/PLATELET - Abnormal; Notable for the following components:      Result Value   Eosinophils Absolute 0.7 (*)    All other components within normal limits  BASIC METABOLIC PANEL - Abnormal; Notable for the following components:   Glucose, Bld 159 (*)    All other components within normal limits  BRAIN NATRIURETIC PEPTIDE - Abnormal; Notable for the following components:   B Natriuretic Peptide 157.6 (*)    All other components within normal limits  TROPONIN I (HIGH SENSITIVITY)  TROPONIN I (HIGH SENSITIVITY)   ____________________________________________  EKG  ED ECG REPORT I,  Chesley Noon, the attending physician, personally viewed and interpreted this ECG.   Date: 04/21/2021  EKG Time: 8:17  Rate: 73  Rhythm: normal sinus rhythm  Axis: Normal  Intervals:none  ST&T Change: None   PROCEDURES  Procedure(s) performed (including Critical Care):  Procedures   ____________________________________________   INITIAL IMPRESSION / ASSESSMENT AND PLAN / ED COURSE      87 year old female with past medical history of hypertension, hyperlipidemia, diabetes, COPD, diastolic CHF who presents to the ED with increasing difficulty breathing and chest heaviness since waking up this morning.  Patient is mildly tachypneic but not in any respiratory distress, is  maintaining O2 sats on room air, but does have expiratory wheezing throughout.  We will treat with DuoNeb and steroids for apparent COPD exacerbation.  EKG shows no evidence of arrhythmia or ischemia but patient does complain of chest heaviness and we will further assess for ACS with 2 sets of troponin.  Additional labs and chest x-ray are pending.  2 sets of troponin are negative, patient reports significant improvement in her symptoms following duo nebs and steroids.  Chest x-ray reviewed by me and shows no infiltrate, edema, or effusion.  Low suspicion for PE given reassuring vital signs with improving symptoms, suspect presentation is due to COPD exacerbation.  Her work of breathing is improved and she is appropriate for discharge home with PCP follow-up.  She was prescribed steroids but states she has plenty of albuterol available at home and there is no indication for antibiotics at this time.  She was counseled to return to the ED for new or worsening symptoms, patient agrees with plan.      ____________________________________________   FINAL CLINICAL IMPRESSION(S) / ED DIAGNOSES  Final diagnoses:  COPD exacerbation (HCC)  Chest pain, unspecified type     ED Discharge Orders          Ordered     predniSONE (DELTASONE) 20 MG tablet  Daily with breakfast        04/21/21 1037             Note:  This document was prepared using Dragon voice recognition software and may include unintentional dictation errors.    Chesley Noon, MD 04/21/21 1040

## 2021-04-27 ENCOUNTER — Ambulatory Visit (INDEPENDENT_AMBULATORY_CARE_PROVIDER_SITE_OTHER): Payer: 59

## 2021-04-27 VITALS — Ht 61.0 in | Wt 125.0 lb

## 2021-04-27 DIAGNOSIS — Z0000 Encounter for general adult medical examination without abnormal findings: Secondary | ICD-10-CM | POA: Diagnosis not present

## 2021-04-27 NOTE — Progress Notes (Signed)
Subjective:   Tanya Cole is a 87 y.o. female who presents for Medicare Annual (Subsequent) preventive examination.  Review of Systems     Cardiac Risk Factors include: advanced age (>26men, >28 women)No ROS.  Medicare Wellness Virtual Visit.  Visual/audio telehealth visit, UTA vital signs.   See social history for additional risk factors.       Objective:    Today's Vitals   04/27/21 1243  Weight: 125 lb (56.7 kg)  Height: 5\' 1"  (1.549 m)   Body mass index is 23.62 kg/m.  Advanced Directives 04/27/2021 04/21/2021 03/18/2021 03/01/2021 03/01/2021 01/11/2021 12/19/2020  Does Patient Have a Medical Advance Directive? No No Yes Yes No Yes Yes  Type of Advance Directive - - Living will;Healthcare Power of Attorney;Out of facility DNR (pink MOST or yellow form) Living will;Healthcare Power of [place];Out of facility DNR (pink MOST or yellow form) - Living will Living will  Does patient want to make changes to medical advance directive? - - - No - Patient declined - No - Patient declined -  Copy of Healthcare Power of Attorney in Chart? - - - No - copy requested - - -  Would patient like information on creating a medical advance directive? No - Patient declined - - - - No - Patient declined -  Pre-existing out of facility DNR order (yellow form or pink MOST form) - - - - - - -    Current Medications (verified) Outpatient Encounter Medications as of 04/27/2021  Medication Sig   ADVAIR DISKUS 250-50 MCG/DOSE AEPB INHALE ONE PUFF BY MOUTH EVERY 12 HOURS. RINSE MOUTH AFTER EACH USE   albuterol (PROVENTIL) (2.5 MG/3ML) 0.083% nebulizer solution USE 1 VIAL IN NEBULIZER EVERY 4 HOURS AS NEEDED FOR WHEEZING OR SHORTNESS OF BREATH   alendronate (FOSAMAX) 70 MG tablet Take 70 mg by mouth once a week.    aspirin 81 MG tablet Take 81 mg by mouth daily.   atorvastatin (LIPITOR) 80 MG tablet Take 1 tablet (80 mg total) by mouth daily at 6 PM.   fluticasone (FLONASE) 50 MCG/ACT nasal spray Use 2  spray(s) in each nostril once daily   ibuprofen (ADVIL) 600 MG tablet Take 1 tablet (600 mg total) by mouth every 8 (eight) hours as needed for mild pain or moderate pain.   lisinopril (ZESTRIL) 20 MG tablet Take 1 tablet by mouth once daily   metFORMIN (GLUCOPHAGE) 500 MG tablet TAKE 1 TABLET BY MOUTH TWICE DAILY WITH A MEAL   metoprolol tartrate (LOPRESSOR) 25 MG tablet Take 1 tablet by mouth twice daily   nitroGLYCERIN (NITROSTAT) 0.4 MG SL tablet Place 1 tablet (0.4 mg total) under the tongue every 5 (five) minutes as needed for chest pain.   PROAIR HFA 108 (90 Base) MCG/ACT inhaler Inhale 2 puffs into the lungs every 4 (four) hours as needed for wheezing or shortness of breath.   Vitamin D, Ergocalciferol, (DRISDOL) 50000 units CAPS capsule Take 50,000 Units by mouth once a week.   No facility-administered encounter medications on file as of 04/27/2021.    Allergies (verified) Tramadol   History: Past Medical History:  Diagnosis Date   Aortic insufficiency    a. TTE 12/19: EF 55-60%, probable HK of the mid apical anterior septal myocardium, Gr1DD, mild AI, mildly dilated LA   Asthma    CAD (coronary artery disease)    a. NSTEMI 12/19; b. LHC 10/08/18: LM minimal luminal irregs, mLAD-1 95% s/p PCI/DES, mLAD-2 60%, LCx mild diffuse disease throughout,  RCA minimal luminal irregs; b. 03/2020 MV: EF>65%, no ischemia/scar.   CHF (congestive heart failure) (HCC)    Diabetes mellitus (HCC)    Hypercholesterolemia    Hypertension    Myocardial infarction (HCC)    Osteopenia    Palpitations    a. 04/2020 Zio: Avg HR 75. 429 SVT episodes, longest 19 secs @ 133. Occas PACs (3.2%). Rare PVCs (<1%).   Polymyalgia rheumatica syndrome (HCC)    Reactive airway disease    Past Surgical History:  Procedure Laterality Date   ABDOMINAL HYSTERECTOMY  1981   prolapse and bleeding, ovaries not removed   BREAST EXCISIONAL BIOPSY Right    CHOLECYSTECTOMY N/A 09/02/2019   Procedure: LAPAROSCOPIC  CHOLECYSTECTOMY WITH INTRAOPERATIVE CHOLANGIOGRAM;  Surgeon: Henrene Dodge, MD;  Location: [facility] ORS;  Service: General;  Laterality: N/A;   CORONARY STENT INTERVENTION N/A 10/08/2018   Procedure: CORONARY STENT INTERVENTION;  Surgeon: Iran Ouch, MD;  Location: [facility] IN[facility]ASIVE CV [facility];  Service: Cardiovascular;  Laterality: N/A;   ENDOSCOPIC RETROGRADE CHOLANGIOPANCREATOGRAPHY (ERCP) WITH PROPOFOL N/A 08/08/2019   Procedure: ENDOSCOPIC RETROGRADE CHOLANGIOPANCREATOGRAPHY (ERCP) WITH PROPOFOL;  Surgeon: Midge Minium, MD;  Location: [facility] ENDOSCOPY;  Service: Endoscopy;  Laterality: N/A;   LEFT HEART CATH AND CORONARY ANGIOGRAPHY N/A 10/08/2018   Procedure: LEFT HEART CATH AND CORONARY ANGIOGRAPHY;  Surgeon: Iran Ouch, MD;  Location: [facility];  Service: Cardiovascular;  Laterality: N/A;   LEFT HEART CATH AND CORONARY ANGIOGRAPHY N/A 08/10/2020   Procedure: LEFT HEART CATH AND CORONARY ANGIOGRAPHY possible percutaneous intervention;  Surgeon: Iran Ouch, MD;  Location: [facility] CV [facility];  Service: Cardiovascular;  Laterality: N/A;   UMBILICAL HERNIA REPAIR  7/94   Family History  Problem Relation Age of Onset   Arthritis Mother    Heart disease Mother    Heart attack Father    Throat cancer Sister    Parkinson's disease Sister    COPD Brother    Social History   Socioeconomic History   Marital status: Widowed    Spouse name: Not on file   Number of children: 3   Years of education: Not on file   Highest education level: Not on file  Occupational History   Not on file  Tobacco Use   Smoking status: Never   Smokeless tobacco: Never  Vaping Use   Vaping Use: Never used  Substance and Sexual Activity   Alcohol use: No    Alcohol/week: 0.0 standard drinks   Drug use: No   Sexual activity: Not Currently  Other Topics Concern   Not on file  Social History Narrative   No smoking; no alcohol; in [place]; worked in Designer, fashion/clothing. Lives by self in [place].  Does all of her own housework. Dtr does food shopping for her.   Social Determinants of Health   Financial Resource Strain: Low Risk    Difficulty of Paying Living Expenses: Not hard at all  Food Insecurity: No Food Insecurity   Worried About Programme researcher, broadcasting/film/video in the Last Year: Never true   Ran Out of Food in the Last Year: Never true  Transportation Needs: No Transportation Needs   Lack of Transportation (Medical): No   Lack of Transportation (Non-Medical): No  Physical Activity: Not on file  Stress: No Stress Concern Present   Feeling of Stress : Not at all  Social Connections: Moderately Integrated   Frequency of Communication with Friends and Family: More than three times a week   Frequency of Social Gatherings  with Friends and Family: More than three times a week   Attends Religious Services: More than 4 times per year   Active Member of Clubs or Organizations: Yes   Attends Banker Meetings: Not on file   Marital Status: Widowed    Tobacco Counseling Counseling given: Not Answered   Clinical Intake:  Pre-visit preparation completed: Yes        Diabetes: No  How often do you need to have someone help you when you read instructions, pamphlets, or other written materials from your doctor or pharmacy?: 1 - Never  Interpreter Needed?: No      Activities of Daily Living In your present state of health, do you have any difficulty performing the following activities: 04/27/2021 12/15/2020  Hearing? N N  Vision? N N  Difficulty concentrating or making decisions? N N  Walking or climbing stairs? N N  Dressing or bathing? N N  Doing errands, shopping? Y N  Preparing Food and eating ? N -  Using the Toilet? N -  In the past six months, have you accidently leaked urine? N -  Do you have problems with loss of bowel control? N -  Managing your Medications? N -  Managing your Finances? N -  Housekeeping or managing your Housekeeping? N -  Some recent  data might be hidden    Patient Care Team: Dale White [place], MD as PCP - General (Internal Medicine) Iran Ouch, MD as PCP - Cardiology (Cardiology)  Indicate any recent Medical Services you may have received from other than [facility] providers in the past year (date may be approximate).     Assessment:   This is a routine wellness examination for Tanya Cole.  I connected with Tanya Cole today by telephone and verified that I am speaking with the correct person using two identifiers. Location patient: home Location provider: work Persons participating in the virtual visit: patient, Engineer, civil (consulting).    I discussed the limitations, risks, security and privacy concerns of performing an evaluation and management service by telephone and the availability of in person appointments. The patient expressed understanding and verbally consented to this telephonic visit.    Interactive audio and video telecommunications were attempted between this provider and patient, however failed, due to patient having technical difficulties OR patient did not have access to video capability.  We continued and completed visit with audio only.  Some vital signs may be absent or patient reported.   Hearing/Vision screen Hearing Screening - Comments:: Patient is able to hear conversational tones without difficulty.  No issues reported.   Vision Screening - Comments:: Followed by Don[facility] Scull [address] Wears corrective lenses Visual acuity not assessed, virtual visit.  They have seen their ophthalmologist in the last 12 months.    Dietary issues and exercise activities discussed: Current Exercise Habits: Home exercise routine, Intensity: Mild Healthy diet Good water intake   Goals Addressed               This Visit's Progress     Patient Stated     Increase physical activity (pt-stated)        Stay active        Depression Screen PHQ 2/9 Scores 04/27/2021 01/29/2021 03/25/2020 01/09/2020 09/30/2019  06/18/2019 01/07/2019  PHQ - 2 Score 0 0 0 0 0 0 0    Fall Risk Fall Risk  04/27/2021 01/29/2021 12/15/2020 10/16/2020 06/08/2020  Falls in the past year? 0 0 0 0 0  Number falls in past yr:  0 0 0 0 -  Injury with Fall? 0 0 0 0 -  Risk for fall due to : - - - - -  Follow up Falls evaluation completed Falls evaluation completed Falls evaluation completed Falls evaluation completed Falls evaluation completed    FALL RISK PREVENTION PERTAINING TO THE HOME: Handrails in use when climbing stairs? Yes Home free of loose throw rugs in walkways, pet beds, electrical cords, etc? Yes  Adequate lighting in your home to reduce risk of falls? Yes   ASSISTIVE DEVICES UTILIZED TO PREVENT FALLS:  Life alert? Yes  Use of a cane, walker or w/c? No  Grab bars in the bathroom? Yes   TIMED UP AND GO: Was the test performed? No .   Cognitive Function: MMSE - Mini Mental State Exam 12/25/2015  Orientation to time 5  Orientation to Place 5  Registration 3  Attention/ Calculation 5  Recall 3  Language- name 2 objects 2  Language- repeat 1  Language- follow 3 step command 3  Language- read & follow direction 1  Write a sentence 1  Copy design 1  Total score 30     6CIT Screen 04/27/2021 01/09/2020 01/07/2019 01/03/2018 12/26/2016  What Year? 0 points 0 points 0 points 0 points 0 points  What month? 0 points 0 points 0 points 0 points 0 points  What time? 0 points 0 points 0 points 0 points 0 points  Count back from 20 - 0 points 2 points 0 points 0 points  Months in reverse 0 points 4 points 4 points 0 points 0 points  Repeat phrase - 0 points - 0 points 0 points  Total Score - 4 - 0 0    Immunizations Immunization History  Administered Date(s) Administered   Pneumococcal Conjugate-13 07/22/2014   Pneumococcal Polysaccharide-23 01/03/2018    TDAP status: Due, Education has been provided regarding the importance of this vaccine. Advised may receive this vaccine at local pharmacy or Health Dept. Aware  to provide a copy of the vaccination record if obtained from local pharmacy or Health Dept. Verbalized acceptance and understanding. Deferred.   Health Maintenance Health Maintenance  Topic Date Due   MAMMOGRAM  01/25/2019   Zoster Vaccines- Shingrix (1 of 2) 07/02/2021 (Originally 06/16/1952)   TETANUS/TDAP  04/27/2022 (Originally 06/16/1952)   INFLUENZA VACCINE  05/31/2021   HEMOGLOBIN A1C  09/30/2021   FOOT EXAM  10/19/2021   OPHTHALMOLOGY EXAM  11/16/2021   DEXA SCAN  Completed   PNA vac Low Risk Adult  Completed   HPV VACCINES  Aged Out   COVID-19 Vaccine  Discontinued   Lung Cancer Screening: (Low Dose CT Chest recommended if Age 78-80 years, 30 pack-year currently smoking OR have quit w/in 15years.) does not qualify.   Hepatitis C Screening: does not qualify.  Vision Screening: Recommended annual ophthalmology exams for early detection of glaucoma and other disorders of the eye.  Dental Screening: Recommended annual dental exams for proper oral hygiene.  Community Resource Referral / Chronic Care Management: CRR required this visit?  No   CCM required this visit?  No      Plan:     I have personally reviewed and noted the following in the patient's chart:   Medical and social history Use of alcohol, tobacco or illicit drugs  Current medications and supplements including opioid prescriptions. Patient is not currently taking opioid.  Functional ability and status Nutritional status Physical activity Advanced directives List of other physicians Hospitalizations, surgeries, and ER  visits in previous 12 months Vitals Screenings to include cognitive, depression, and falls Referrals and appointments  In addition, I have reviewed and discussed with patient certain preventive protocols, quality metrics, and best practice recommendations. A written personalized care plan for preventive services as well as general preventive health recommendations were provided to patient  via mail.     Ashok Pall, LPN   1/61/0960

## 2021-04-27 NOTE — Patient Instructions (Addendum)
  Tanya Cole , Thank you for taking time to come for your Medicare Wellness Visit. I appreciate your ongoing commitment to your health goals. Please review the following plan we discussed and let me know if I can assist you in the future.   These are the goals we discussed:  Goals       Patient Stated     Increase physical activity (pt-stated)      Stay active         This is a list of the screening recommended for you and due dates:  Health Maintenance  Topic Date Due   Mammogram  01/25/2019   Zoster (Shingles) Vaccine (1 of 2) 07/02/2021*   Tetanus Vaccine  04/27/2022*   Flu Shot  05/31/2021   Hemoglobin A1C  09/30/2021   Complete foot exam   10/19/2021   Eye exam for diabetics  11/16/2021   DEXA scan (bone density measurement)  Completed   Pneumonia vaccines  Completed   HPV Vaccine  Aged Out   COVID-19 Vaccine  Discontinued  *Topic was postponed. The date shown is not the original due date.

## 2021-05-04 ENCOUNTER — Other Ambulatory Visit: Payer: Self-pay | Admitting: Family

## 2021-05-05 ENCOUNTER — Encounter: Payer: Medicare HMO | Admitting: Internal Medicine

## 2021-05-10 ENCOUNTER — Other Ambulatory Visit: Payer: Self-pay | Admitting: Cardiovascular Disease

## 2021-05-11 ENCOUNTER — Other Ambulatory Visit: Payer: Self-pay | Admitting: Internal Medicine

## 2021-05-12 ENCOUNTER — Other Ambulatory Visit: Payer: Self-pay

## 2021-05-12 ENCOUNTER — Emergency Department
Admission: EM | Admit: 2021-05-12 | Discharge: 2021-05-12 | Disposition: A | Discharge status: Home / Self Care | Payer: Medicare HMO | Source: Home / Self Care | Attending: Emergency Medicine | Admitting: Emergency Medicine

## 2021-05-12 ENCOUNTER — Encounter: Payer: Self-pay | Admitting: Internal Medicine

## 2021-05-12 ENCOUNTER — Emergency Department: Payer: Medicare HMO

## 2021-05-12 DIAGNOSIS — R0902 Hypoxemia: Secondary | ICD-10-CM | POA: Diagnosis not present

## 2021-05-12 DIAGNOSIS — Z66 Do not resuscitate: Secondary | ICD-10-CM | POA: Diagnosis present

## 2021-05-12 DIAGNOSIS — I252 Old myocardial infarction: Secondary | ICD-10-CM | POA: Diagnosis not present

## 2021-05-12 DIAGNOSIS — J9621 Acute and chronic respiratory failure with hypoxia: Secondary | ICD-10-CM | POA: Diagnosis not present

## 2021-05-12 DIAGNOSIS — I351 Nonrheumatic aortic (valve) insufficiency: Secondary | ICD-10-CM | POA: Diagnosis present

## 2021-05-12 DIAGNOSIS — J441 Chronic obstructive pulmonary disease with (acute) exacerbation: Secondary | ICD-10-CM | POA: Diagnosis present

## 2021-05-12 DIAGNOSIS — I25118 Atherosclerotic heart disease of native coronary artery with other forms of angina pectoris: Secondary | ICD-10-CM | POA: Insufficient documentation

## 2021-05-12 DIAGNOSIS — R079 Chest pain, unspecified: Secondary | ICD-10-CM

## 2021-05-12 DIAGNOSIS — Z20822 Contact with and (suspected) exposure to covid-19: Secondary | ICD-10-CM | POA: Diagnosis present

## 2021-05-12 DIAGNOSIS — Z9049 Acquired absence of other specified parts of digestive tract: Secondary | ICD-10-CM | POA: Diagnosis not present

## 2021-05-12 DIAGNOSIS — M353 Polymyalgia rheumatica: Secondary | ICD-10-CM | POA: Diagnosis present

## 2021-05-12 DIAGNOSIS — Z7983 Long term (current) use of bisphosphonates: Secondary | ICD-10-CM | POA: Diagnosis not present

## 2021-05-12 DIAGNOSIS — J8410 Pulmonary fibrosis, unspecified: Secondary | ICD-10-CM | POA: Diagnosis present

## 2021-05-12 DIAGNOSIS — Z7984 Long term (current) use of oral hypoglycemic drugs: Secondary | ICD-10-CM | POA: Insufficient documentation

## 2021-05-12 DIAGNOSIS — R0789 Other chest pain: Secondary | ICD-10-CM | POA: Diagnosis not present

## 2021-05-12 DIAGNOSIS — E7800 Pure hypercholesterolemia, unspecified: Secondary | ICD-10-CM | POA: Diagnosis present

## 2021-05-12 DIAGNOSIS — I5032 Chronic diastolic (congestive) heart failure: Secondary | ICD-10-CM | POA: Diagnosis present

## 2021-05-12 DIAGNOSIS — Z955 Presence of coronary angioplasty implant and graft: Secondary | ICD-10-CM | POA: Diagnosis not present

## 2021-05-12 DIAGNOSIS — F05 Delirium due to known physiological condition: Secondary | ICD-10-CM | POA: Diagnosis not present

## 2021-05-12 DIAGNOSIS — A419 Sepsis, unspecified organism: Secondary | ICD-10-CM | POA: Diagnosis not present

## 2021-05-12 DIAGNOSIS — I4891 Unspecified atrial fibrillation: Secondary | ICD-10-CM | POA: Diagnosis not present

## 2021-05-12 DIAGNOSIS — E1159 Type 2 diabetes mellitus with other circulatory complications: Secondary | ICD-10-CM | POA: Diagnosis not present

## 2021-05-12 DIAGNOSIS — E876 Hypokalemia: Secondary | ICD-10-CM | POA: Diagnosis present

## 2021-05-12 DIAGNOSIS — I10 Essential (primary) hypertension: Secondary | ICD-10-CM | POA: Diagnosis not present

## 2021-05-12 DIAGNOSIS — J9601 Acute respiratory failure with hypoxia: Secondary | ICD-10-CM | POA: Diagnosis present

## 2021-05-12 DIAGNOSIS — J45909 Unspecified asthma, uncomplicated: Secondary | ICD-10-CM | POA: Insufficient documentation

## 2021-05-12 DIAGNOSIS — I2510 Atherosclerotic heart disease of native coronary artery without angina pectoris: Secondary | ICD-10-CM | POA: Diagnosis present

## 2021-05-12 DIAGNOSIS — E1165 Type 2 diabetes mellitus with hyperglycemia: Secondary | ICD-10-CM | POA: Diagnosis not present

## 2021-05-12 DIAGNOSIS — Z79899 Other long term (current) drug therapy: Secondary | ICD-10-CM | POA: Diagnosis not present

## 2021-05-12 DIAGNOSIS — E860 Dehydration: Secondary | ICD-10-CM | POA: Diagnosis present

## 2021-05-12 DIAGNOSIS — I110 Hypertensive heart disease with heart failure: Secondary | ICD-10-CM | POA: Diagnosis present

## 2021-05-12 DIAGNOSIS — Z90710 Acquired absence of both cervix and uterus: Secondary | ICD-10-CM | POA: Diagnosis not present

## 2021-05-12 DIAGNOSIS — Z7982 Long term (current) use of aspirin: Secondary | ICD-10-CM | POA: Diagnosis not present

## 2021-05-12 DIAGNOSIS — E119 Type 2 diabetes mellitus without complications: Secondary | ICD-10-CM | POA: Insufficient documentation

## 2021-05-12 DIAGNOSIS — R0602 Shortness of breath: Secondary | ICD-10-CM | POA: Insufficient documentation

## 2021-05-12 LAB — COMPREHENSIVE METABOLIC PANEL
ALT: 15 U/L (ref 0–44)
AST: 16 U/L (ref 15–41)
Albumin: 3.9 g/dL (ref 3.5–5.0)
Alkaline Phosphatase: 70 U/L (ref 38–126)
Anion gap: 9 (ref 5–15)
BUN: 13 mg/dL (ref 8–23)
CO2: 26 mmol/L (ref 22–32)
Calcium: 9.4 mg/dL (ref 8.9–10.3)
Chloride: 107 mmol/L (ref 98–111)
Creatinine, Ser: 0.61 mg/dL (ref 0.44–1.00)
GFR, Estimated: >60 mL/min (ref >60)
Glucose, Bld: 193 mg/dL — ABNORMAL HIGH (ref 70–99)
Potassium: 3.5 mmol/L (ref 3.5–5.1)
Sodium: 142 mmol/L (ref 135–145)
Total Bilirubin: 1.2 mg/dL (ref 0.3–1.2)
Total Protein: 6.9 g/dL (ref 6.5–8.1)

## 2021-05-12 LAB — CBC
HCT: 39.2 % (ref 36.0–46.0)
Hemoglobin: 13.3 g/dL (ref 12.0–15.0)
MCH: 29.2 pg (ref 26.0–34.0)
MCHC: 33.9 g/dL (ref 30.0–36.0)
MCV: 86 fL (ref 80.0–100.0)
Platelets: 225 10*3/uL (ref 150–400)
RBC: 4.56 MIL/uL (ref 3.87–5.11)
RDW: 12.9 % (ref 11.5–15.5)
WBC: 9.1 10*3/uL (ref 4.0–10.5)
nRBC: 0 % (ref 0.0–0.2)

## 2021-05-12 LAB — TROPONIN I (HIGH SENSITIVITY)
Troponin I (High Sensitivity): 7 ng/L (ref <18)
Troponin I (High Sensitivity): 6 ng/L (ref <18)

## 2021-05-12 MED ORDER — ACETAMINOPHEN 500 MG PO TABS
1000.0000 mg | ORAL_TABLET | Freq: Once | ORAL | Status: AC
  Administered 2021-05-12: 1000 mg via ORAL
  Filled 2021-05-12: qty 2

## 2021-05-12 NOTE — ED Triage Notes (Signed)
Pt BIB EMS from home C/O chest pressure, SOB starting this AM when she got up. Per EMS, initial BP 200/120. Pt received 324 ASA, 2 inches Nitro paste, 1 spray IN Nitro.

## 2021-05-12 NOTE — ED Provider Notes (Signed)
[facility] Emergency Department Provider Note  Time seen: 7:11 AM  I have reviewed the triage vital signs and the nursing notes.   HISTORY  Chief Complaint Chest Pain   HPI Tanya Cole is a 87 y.o. female with a past medical history of asthma, CAD, CHF, diabetes, hypertension, hyperlipidemia, prior MI with a stent, follows up with Dr.Arida, presents to the emergency department for central chest pain/tightness.  According to the patient approximately an hour ago she awoke with central chest pain.  States it was moderate in severity which she describes as a tightness in the chest along some mild shortness of breath.  Patient denies any shortness of breath currently and states that chest tightness has diminished and is mild currently.  Denies any fever denies any cough denies any abdominal pain.  Patient did receive nitroglycerin ointment by EMS.  Denies any nausea or diaphoresis at any point.   Past Medical History:  Diagnosis Date   Aortic insufficiency    a. TTE 12/19: EF 55-60%, probable HK of the mid apical anterior septal myocardium, Gr1DD, mild AI, mildly dilated LA   Asthma    CAD (coronary artery disease)    a. NSTEMI 12/19; b. LHC 10/08/18: LM minimal luminal irregs, mLAD-1 95% s/p PCI/DES, mLAD-2 60%, LCx mild diffuse disease throughout, RCA minimal luminal irregs; b. 03/2020 MV: EF>65%, no ischemia/scar.   CHF (congestive heart failure) (HCC)    Diabetes mellitus (HCC)    Hypercholesterolemia    Hypertension    Myocardial infarction (HCC)    Osteopenia    Palpitations    a. 04/2020 Zio: Avg HR 75. 429 SVT episodes, longest 19 secs @ 133. Occas PACs (3.2%). Rare PVCs (<1%).   Polymyalgia rheumatica syndrome (HCC)    Reactive airway disease     Patient Active Problem List   Diagnosis Date Noted   Joint ache 04/09/2021   Calcified granuloma of lung (HCC) 03/02/2021   Acute exacerbation of chronic obstructive pulmonary disease (COPD) (HCC)  03/01/2021   COPD exacerbation (HCC) 11/29/2020   Chronic diastolic CHF (congestive heart failure) (HCC) 11/29/2020   CAP (community acquired pneumonia) 11/29/2020   Rib pain on right side 10/19/2020   Abnormal CXR 10/19/2020   Type 2 diabetes mellitus with cardiac complication (HCC) 10/08/2020   Elevated troponin 10/08/2020   Acute on chronic heart failure with preserved ejection fraction (HFpEF) (HCC) 10/08/2020   COPD with acute exacerbation (HCC) 08/19/2020   History of non-ST elevation myocardial infarction (NSTEMI) 08/07/2020   Asthma 08/07/2020   Leukocytosis 08/07/2020   Left shoulder pain 07/12/2020   Iron deficiency 04/16/2020   Anemia 04/04/2020   SOB (shortness of breath) 03/25/2020   Cough 09/30/2019   Gallstone pancreatitis    Pre-op evaluation 08/30/2019   Cholecystitis 08/05/2019   Choledocholithiasis    Respiratory illness 06/18/2019   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) 12/29/2018   Chest pain 10/07/2018   Memory change 05/27/2018   Osteoporosis 02/05/2018   Weight loss 06/24/2017   Abdominal pain, left lower quadrant 10/05/2016   Long term current use of systemic steroids 05/16/2016   Elevated erythrocyte sedimentation rate 05/04/2016   Back pain 04/29/2016   Fatigue 05/24/2015   Health care maintenance 01/25/2015   UTI (urinary tract infection) 07/07/2014   Neuropathy 03/24/2014   Diverticulitis 02/24/2013   Reactive airway disease 09/15/2012   Hypertension 09/14/2012   Hypercholesterolemia 09/14/2012   Diabetes mellitus with cardiac complication (HCC) 09/14/2012  Past Surgical History:  Procedure Laterality Date   ABDOMINAL HYSTERECTOMY  1981   prolapse and bleeding, ovaries not removed   BREAST EXCISIONAL BIOPSY Right    CHOLECYSTECTOMY N/A 09/02/2019   Procedure: LAPAROSCOPIC CHOLECYSTECTOMY WITH INTRAOPERATIVE CHOLANGIOGRAM;  Surgeon: Henrene Dodge, MD;  Location: [facility] ORS;  Service: General;  Laterality:  N/A;   CORONARY STENT INTERVENTION N/A 10/08/2018   Procedure: CORONARY STENT INTERVENTION;  Surgeon: Iran Ouch, MD;  Location: [facility] IN[facility]IVE CV [facility];  Service: Cardiovascular;  Laterality: N/A;   ENDOSCOPIC RETROGRADE CHOLANGIOPANCREATOGRAPHY (ERCP) WITH PROPOFOL N/A 08/08/2019   Procedure: ENDOSCOPIC RETROGRADE CHOLANGIOPANCREATOGRAPHY (ERCP) WITH PROPOFOL;  Surgeon: Midge Minium, MD;  Location: [facility] ENDOSCOPY;  Service: Endoscopy;  Laterality: N/A;   LEFT HEART CATH AND CORONARY ANGIOGRAPHY N/A 10/08/2018   Procedure: LEFT HEART CATH AND CORONARY ANGIOGRAPHY;  Surgeon: Iran Ouch, MD;  Location: [facility];  Service: Cardiovascular;  Laterality: N/A;   LEFT HEART CATH AND CORONARY ANGIOGRAPHY N/A 08/10/2020   Procedure: LEFT HEART CATH AND CORONARY ANGIOGRAPHY possible percutaneous intervention;  Surgeon: Iran Ouch, MD;  Location: [facility] CV [facility];  Service: Cardiovascular;  Laterality: N/A;   UMBILICAL HERNIA REPAIR  7/94    Prior to Admission medications   Medication Sig Start Date End Date Taking? Authorizing Provider  ADVAIR DISKUS 250-50 MCG/DOSE AEPB INHALE ONE PUFF BY MOUTH EVERY 12 HOURS. RINSE MOUTH AFTER EACH USE 02/17/16   Dale Lamoni, MD  albuterol (PROVENTIL) (2.5 MG/3ML) 0.083% nebulizer solution USE 1 VIAL IN NEBULIZER EVERY 4 HOURS AS NEEDED FOR WHEEZING OR SHORTNESS OF BREATH 03/30/21   Dale Roy, MD  alendronate (FOSAMAX) 70 MG tablet Take 70 mg by mouth once a week.  11/06/17   [provider]  aspirin 81 MG tablet Take 81 mg by mouth daily.    [provider]  atorvastatin (LIPITOR) 80 MG tablet TAKE 1 TABLET BY MOUTH ONCE DAILY AT  6PM 05/04/21   Alver Sorrow, NP  fluticasone [facility]) 50 MCG/ACT nasal spray Use 2 spray(s) in each nostril once daily 08/03/20   Dale [place], MD  ibuprofen (ADVIL) 600 MG tablet Take 1 tablet (600 mg total) by mouth every 8 (eight) hours as needed for mild pain or moderate pain.  03/18/21   Merwyn Katos, MD  lisinopril (ZESTRIL) 20 MG tablet Take 1 tablet by mouth once daily 04/05/21   Dunn, Raymon Mutton, PA-C  metFORMIN (GLUCOPHAGE) 500 MG tablet TAKE 1 TABLET BY MOUTH TWICE DAILY WITH A MEAL 09/29/20   Dale Garnet, MD  metoprolol tartrate (LOPRESSOR) 25 MG tablet Take 1 tablet by mouth twice daily 05/11/21   Iran Ouch, MD  nitroGLYCERIN (NITROSTAT) 0.4 MG SL tablet Place 1 tablet (0.4 mg total) under the tongue every 5 (five) minutes as needed for chest pain. 10/09/18   Adrian Saran, MD  PROAIR HFA 108 (90 Base) MCG/ACT inhaler Inhale 2 puffs into the lungs every 4 (four) hours as needed for wheezing or shortness of breath. 08/20/20   Rolly Salter, MD  Vitamin D, Ergocalciferol, (DRISDOL) 50000 units CAPS capsule Take 50,000 Units by mouth once a week. 06/24/18   [provider]    Allergies  Allergen Reactions   Tramadol Itching and Nausea And Vomiting    Family History  Problem Relation Age of Onset   Arthritis Mother    Heart disease Mother    Heart attack Father    Throat cancer Sister    Parkinson's disease Sister  COPD Brother     Social History Social History   Tobacco Use   Smoking status: Never   Smokeless tobacco: Never  Vaping Use   Vaping Use: Never used  Substance Use Topics   Alcohol use: No    Alcohol/week: 0.0 standard drinks   Drug use: No    Review of Systems Constitutional: Negative for fever. Cardiovascular: Positive for chest tightness starting this morning around 6 AM, now mild. Respiratory: Negative for shortness of breath. Gastrointestinal: Negative for abdominal pain, vomiting and diarrhea. Musculoskeletal: Negative for musculoskeletal complaints Neurological: Negative for headache All other ROS negative  ____________________________________________   PHYSICAL EXAM:  VITAL SIGNS: ED Triage Vitals  Enc Vitals Group     BP 05/12/21 0706 (!) 160/81     Pulse Rate 05/12/21 0706 79     Resp  05/12/21 0706 16     Temp 05/12/21 0706 98.9 F (37.2 C)     Temp Source 05/12/21 0706 Oral     SpO2 05/12/21 0706 95 %     Weight 05/12/21 0705 125 lb (56.7 kg)     Height 05/12/21 0705 5\' 1"  (1.549 m)     Head Circumference --      Peak Flow --      Pain Score 05/12/21 0707 7     Pain Loc --      Pain Edu? --      Excl. in GC? --    Constitutional: Alert and oriented. Well appearing and in no distress. Eyes: Normal exam ENT      Head: Normocephalic and atraumatic.      Mouth/Throat: Mucous membranes are moist. Cardiovascular: Normal rate, regular rhythm.  Respiratory: Normal respiratory effort without tachypnea nor retractions. Breath sounds are clear Gastrointestinal: Soft and nontender. No distention. Musculoskeletal: Nontender with normal range of motion in all extremities. Neurologic:  Normal speech and language. No gross focal neurologic deficits Skin:  Skin is warm, dry and intact.  Psychiatric: Mood and affect are normal.   ____________________________________________    EKG  EKG viewed and interpreted by myself shows a normal sinus rhythm at 78 bpm with a narrow QRS, normal axis, normal intervals, nonspecific ST changes.  ____________________________________________    RADIOLOGY  This x-ray is negative  ____________________________________________   INITIAL IMPRESSION / ASSESSMENT AND PLAN / ED COURSE  Pertinent labs & imaging results that were available during my care of the patient were reviewed by me and considered in my medical decision making (see chart for details).   Patient presents emergency department for central chest pain/tightness starting around 6 AM this morning.  Overall the patient appears well, no distress.  States that chest pain is much improved but describes as a slight or mild tightness still in the center of her chest.  We will check labs including cardiac enzymes x2, chest x-ray.  No concerning findings on EKG.  Patient is mildly  hypertensive otherwise reassuring vitals.  Chest x-ray is negative.  Lab work is reassuring including a negative troponin x2.  Vitals are normal.  We will discharge patient home with PCP follow-up.  Patient agreeable to plan of care.  Patient is feeling well.  05/14/21 Tanya Cole was evaluated in Emergency Department on 05/12/2021 for the symptoms described in the history of present illness. She was evaluated in the context of the global COVID-19 pandemic, which necessitated consideration that the patient might be at risk for infection with the SARS-CoV-2 virus that causes COVID-19. Institutional protocols and algorithms that pertain  to the evaluation of patients at risk for COVID-19 are in a state of rapid change based on information released by regulatory bodies including the CDC and federal and state organizations. These policies and algorithms were followed during the patient's care in the ED.  ____________________________________________   FINAL CLINICAL IMPRESSION(S) / ED DIAGNOSES  Chest pain   [name], MD 05/12/21 1123

## 2021-05-12 NOTE — ED Notes (Signed)
Portable Xray at bedside.

## 2021-05-12 NOTE — ED Notes (Signed)
MD at bedside reassessing pt and updating pt and daughter on test results and discharge instructions.

## 2021-05-14 ENCOUNTER — Other Ambulatory Visit: Payer: Self-pay

## 2021-05-14 ENCOUNTER — Emergency Department: Payer: Medicare HMO

## 2021-05-14 ENCOUNTER — Inpatient Hospital Stay
Admission: EM | Admit: 2021-05-14 | Discharge: 2021-05-17 | DRG: 190 | Disposition: A | Discharge status: Home / Self Care | Payer: Medicare HMO | Attending: Internal Medicine | Admitting: Internal Medicine

## 2021-05-14 DIAGNOSIS — Z825 Family history of asthma and other chronic lower respiratory diseases: Secondary | ICD-10-CM

## 2021-05-14 DIAGNOSIS — J9621 Acute and chronic respiratory failure with hypoxia: Secondary | ICD-10-CM | POA: Diagnosis not present

## 2021-05-14 DIAGNOSIS — Z7984 Long term (current) use of oral hypoglycemic drugs: Secondary | ICD-10-CM

## 2021-05-14 DIAGNOSIS — Z9049 Acquired absence of other specified parts of digestive tract: Secondary | ICD-10-CM | POA: Diagnosis not present

## 2021-05-14 DIAGNOSIS — I351 Nonrheumatic aortic (valve) insufficiency: Secondary | ICD-10-CM | POA: Diagnosis present

## 2021-05-14 DIAGNOSIS — I10 Essential (primary) hypertension: Secondary | ICD-10-CM | POA: Diagnosis not present

## 2021-05-14 DIAGNOSIS — Z79899 Other long term (current) drug therapy: Secondary | ICD-10-CM | POA: Diagnosis not present

## 2021-05-14 DIAGNOSIS — Z7982 Long term (current) use of aspirin: Secondary | ICD-10-CM | POA: Diagnosis not present

## 2021-05-14 DIAGNOSIS — Z7983 Long term (current) use of bisphosphonates: Secondary | ICD-10-CM

## 2021-05-14 DIAGNOSIS — I110 Hypertensive heart disease with heart failure: Secondary | ICD-10-CM | POA: Diagnosis present

## 2021-05-14 DIAGNOSIS — Z885 Allergy status to narcotic agent status: Secondary | ICD-10-CM

## 2021-05-14 DIAGNOSIS — I2510 Atherosclerotic heart disease of native coronary artery without angina pectoris: Secondary | ICD-10-CM | POA: Diagnosis present

## 2021-05-14 DIAGNOSIS — Z8249 Family history of ischemic heart disease and other diseases of the circulatory system: Secondary | ICD-10-CM

## 2021-05-14 DIAGNOSIS — Z955 Presence of coronary angioplasty implant and graft: Secondary | ICD-10-CM | POA: Diagnosis not present

## 2021-05-14 DIAGNOSIS — I252 Old myocardial infarction: Secondary | ICD-10-CM | POA: Diagnosis not present

## 2021-05-14 DIAGNOSIS — Z90710 Acquired absence of both cervix and uterus: Secondary | ICD-10-CM | POA: Diagnosis not present

## 2021-05-14 DIAGNOSIS — J9601 Acute respiratory failure with hypoxia: Secondary | ICD-10-CM | POA: Diagnosis present

## 2021-05-14 DIAGNOSIS — F05 Delirium due to known physiological condition: Secondary | ICD-10-CM | POA: Diagnosis not present

## 2021-05-14 DIAGNOSIS — E7800 Pure hypercholesterolemia, unspecified: Secondary | ICD-10-CM | POA: Diagnosis present

## 2021-05-14 DIAGNOSIS — E1165 Type 2 diabetes mellitus with hyperglycemia: Secondary | ICD-10-CM | POA: Diagnosis not present

## 2021-05-14 DIAGNOSIS — E1159 Type 2 diabetes mellitus with other circulatory complications: Secondary | ICD-10-CM | POA: Diagnosis not present

## 2021-05-14 DIAGNOSIS — I4891 Unspecified atrial fibrillation: Secondary | ICD-10-CM | POA: Diagnosis not present

## 2021-05-14 DIAGNOSIS — M353 Polymyalgia rheumatica: Secondary | ICD-10-CM | POA: Diagnosis present

## 2021-05-14 DIAGNOSIS — Z66 Do not resuscitate: Secondary | ICD-10-CM | POA: Diagnosis present

## 2021-05-14 DIAGNOSIS — I5032 Chronic diastolic (congestive) heart failure: Secondary | ICD-10-CM | POA: Diagnosis present

## 2021-05-14 DIAGNOSIS — R079 Chest pain, unspecified: Secondary | ICD-10-CM | POA: Diagnosis not present

## 2021-05-14 DIAGNOSIS — J441 Chronic obstructive pulmonary disease with (acute) exacerbation: Principal | ICD-10-CM | POA: Diagnosis present

## 2021-05-14 DIAGNOSIS — R0602 Shortness of breath: Secondary | ICD-10-CM | POA: Diagnosis not present

## 2021-05-14 DIAGNOSIS — E876 Hypokalemia: Secondary | ICD-10-CM

## 2021-05-14 DIAGNOSIS — J8410 Pulmonary fibrosis, unspecified: Secondary | ICD-10-CM | POA: Diagnosis present

## 2021-05-14 DIAGNOSIS — E860 Dehydration: Secondary | ICD-10-CM | POA: Diagnosis present

## 2021-05-14 DIAGNOSIS — A419 Sepsis, unspecified organism: Secondary | ICD-10-CM | POA: Diagnosis present

## 2021-05-14 DIAGNOSIS — E119 Type 2 diabetes mellitus without complications: Secondary | ICD-10-CM | POA: Diagnosis present

## 2021-05-14 DIAGNOSIS — T380X5A Adverse effect of glucocorticoids and synthetic analogues, initial encounter: Secondary | ICD-10-CM | POA: Diagnosis not present

## 2021-05-14 DIAGNOSIS — J45901 Unspecified asthma with (acute) exacerbation: Secondary | ICD-10-CM | POA: Diagnosis present

## 2021-05-14 DIAGNOSIS — Z20822 Contact with and (suspected) exposure to covid-19: Secondary | ICD-10-CM | POA: Diagnosis present

## 2021-05-14 DIAGNOSIS — R0789 Other chest pain: Secondary | ICD-10-CM | POA: Diagnosis not present

## 2021-05-14 LAB — LACTIC ACID, PLASMA
Lactic Acid, Venous: 3.1 mmol/L (ref 0.5–1.9)
Lactic Acid, Venous: 3.4 mmol/L (ref 0.5–1.9)

## 2021-05-14 LAB — GLUCOSE, CAPILLARY
Glucose-Capillary: 332 mg/dL — ABNORMAL HIGH (ref 70–99)
Glucose-Capillary: 354 mg/dL — ABNORMAL HIGH (ref 70–99)
Glucose-Capillary: 308 mg/dL — ABNORMAL HIGH (ref 70–99)
Glucose-Capillary: 248 mg/dL — ABNORMAL HIGH (ref 70–99)

## 2021-05-14 LAB — CBC
HCT: 36.5 % (ref 36.0–46.0)
Hemoglobin: 12.5 g/dL (ref 12.0–15.0)
MCH: 30.2 pg (ref 26.0–34.0)
MCHC: 34.2 g/dL (ref 30.0–36.0)
MCV: 88.2 fL (ref 80.0–100.0)
Platelets: 247 10*3/uL (ref 150–400)
RBC: 4.14 MIL/uL (ref 3.87–5.11)
RDW: 13.1 % (ref 11.5–15.5)
WBC: 11.2 10*3/uL — ABNORMAL HIGH (ref 4.0–10.5)
nRBC: 0 % (ref 0.0–0.2)

## 2021-05-14 LAB — BASIC METABOLIC PANEL
Anion gap: 9 (ref 5–15)
BUN: 12 mg/dL (ref 8–23)
CO2: 25 mmol/L (ref 22–32)
Calcium: 9.3 mg/dL (ref 8.9–10.3)
Chloride: 105 mmol/L (ref 98–111)
Creatinine, Ser: 0.65 mg/dL (ref 0.44–1.00)
GFR, Estimated: >60 mL/min (ref >60)
Glucose, Bld: 227 mg/dL — ABNORMAL HIGH (ref 70–99)
Potassium: 3.4 mmol/L — ABNORMAL LOW (ref 3.5–5.1)
Sodium: 139 mmol/L (ref 135–145)

## 2021-05-14 LAB — RESP PANEL BY RT-PCR (FLU A&B, COVID) ARPGX2
Influenza A by PCR: NEGATIVE
Influenza B by PCR: NEGATIVE
SARS Coronavirus 2 by RT PCR: NEGATIVE

## 2021-05-14 LAB — TROPONIN I (HIGH SENSITIVITY)
Troponin I (High Sensitivity): 7 ng/L (ref <18)
Troponin I (High Sensitivity): 8 ng/L (ref <18)

## 2021-05-14 LAB — PROCALCITONIN: Procalcitonin: <0.1 ng/mL

## 2021-05-14 LAB — MAGNESIUM: Magnesium: 1.5 mg/dL — ABNORMAL LOW (ref 1.7–2.4)

## 2021-05-14 LAB — BRAIN NATRIURETIC PEPTIDE: B Natriuretic Peptide: 106.2 pg/mL — ABNORMAL HIGH (ref 0.0–100.0)

## 2021-05-14 MED ORDER — ALBUTEROL SULFATE (2.5 MG/3ML) 0.083% IN NEBU
2.5000 mg | INHALATION_SOLUTION | RESPIRATORY_TRACT | Status: DC | PRN
  Administered 2021-05-16: 2.5 mg via RESPIRATORY_TRACT
  Filled 2021-05-14: qty 3

## 2021-05-14 MED ORDER — ATORVASTATIN CALCIUM 20 MG PO TABS
80.0000 mg | ORAL_TABLET | Freq: Every day | ORAL | Status: DC
  Administered 2021-05-14 – 2021-05-17 (×4): 80 mg via ORAL
  Filled 2021-05-14 (×5): qty 4

## 2021-05-14 MED ORDER — AZITHROMYCIN 250 MG PO TABS
250.0000 mg | ORAL_TABLET | Freq: Every day | ORAL | Status: DC
  Administered 2021-05-15 – 2021-05-17 (×3): 250 mg via ORAL
  Filled 2021-05-14 (×3): qty 1

## 2021-05-14 MED ORDER — IPRATROPIUM-ALBUTEROL 0.5-2.5 (3) MG/3ML IN SOLN
3.0000 mL | Freq: Once | RESPIRATORY_TRACT | Status: AC
  Administered 2021-05-14: 3 mL via RESPIRATORY_TRACT
  Filled 2021-05-14: qty 9

## 2021-05-14 MED ORDER — DM-GUAIFENESIN ER 30-600 MG PO TB12
1.0000 | ORAL_TABLET | Freq: Two times a day (BID) | ORAL | Status: DC | PRN
  Administered 2021-05-14: 1 via ORAL
  Filled 2021-05-14 (×2): qty 1

## 2021-05-14 MED ORDER — ONDANSETRON HCL 4 MG/2ML IJ SOLN
4.0000 mg | Freq: Three times a day (TID) | INTRAMUSCULAR | Status: DC | PRN
Start: 2021-05-14 — End: 2021-05-17
  Administered 2021-05-17: 4 mg via INTRAVENOUS
  Filled 2021-05-14: qty 2

## 2021-05-14 MED ORDER — IPRATROPIUM-ALBUTEROL 0.5-2.5 (3) MG/3ML IN SOLN
3.0000 mL | Freq: Once | RESPIRATORY_TRACT | Status: AC
  Administered 2021-05-14: 3 mL via RESPIRATORY_TRACT

## 2021-05-14 MED ORDER — INSULIN ASPART 100 UNIT/ML IJ SOLN
0.0000 [IU] | Freq: Three times a day (TID) | INTRAMUSCULAR | Status: DC
  Administered 2021-05-14: 7 [IU] via SUBCUTANEOUS
  Administered 2021-05-14: 9 [IU] via SUBCUTANEOUS
  Administered 2021-05-14: 7 [IU] via SUBCUTANEOUS
  Administered 2021-05-15: 5 [IU] via SUBCUTANEOUS
  Filled 2021-05-14 (×3): qty 1

## 2021-05-14 MED ORDER — MOMETASONE FURO-FORMOTEROL FUM 200-5 MCG/ACT IN AERO
2.0000 | INHALATION_SPRAY | Freq: Two times a day (BID) | RESPIRATORY_TRACT | Status: DC
  Administered 2021-05-14 – 2021-05-17 (×7): 2 via RESPIRATORY_TRACT
  Filled 2021-05-14: qty 8.8

## 2021-05-14 MED ORDER — SODIUM CHLORIDE 0.9 % IV BOLUS
1000.0000 mL | Freq: Once | INTRAVENOUS | Status: AC
  Administered 2021-05-14: 1000 mL via INTRAVENOUS

## 2021-05-14 MED ORDER — INSULIN ASPART 100 UNIT/ML IJ SOLN: 0.0000 [IU] | Freq: Three times a day (TID) | INTRAMUSCULAR | Status: DC

## 2021-05-14 MED ORDER — IPRATROPIUM-ALBUTEROL 0.5-2.5 (3) MG/3ML IN SOLN
3.0000 mL | Freq: Four times a day (QID) | RESPIRATORY_TRACT | Status: DC
  Administered 2021-05-15 (×2): 3 mL via RESPIRATORY_TRACT
  Filled 2021-05-14 (×2): qty 3

## 2021-05-14 MED ORDER — NITROGLYCERIN 0.4 MG SL SUBL: 0.4000 mg | SUBLINGUAL_TABLET | SUBLINGUAL | Status: DC | PRN

## 2021-05-14 MED ORDER — ENOXAPARIN SODIUM 40 MG/0.4ML IJ SOSY
40.0000 mg | PREFILLED_SYRINGE | INTRAMUSCULAR | Status: DC
  Administered 2021-05-14 – 2021-05-16 (×3): 40 mg via SUBCUTANEOUS
  Filled 2021-05-14 (×3): qty 0.4

## 2021-05-14 MED ORDER — HYDRALAZINE HCL 20 MG/ML IJ SOLN: 5.0000 mg | INTRAMUSCULAR | Status: DC | PRN

## 2021-05-14 MED ORDER — POTASSIUM CHLORIDE CRYS ER 20 MEQ PO TBCR
40.0000 meq | EXTENDED_RELEASE_TABLET | Freq: Once | ORAL | Status: AC
  Administered 2021-05-14: 40 meq via ORAL
  Filled 2021-05-14: qty 2

## 2021-05-14 MED ORDER — METHYLPREDNISOLONE SODIUM SUCC 125 MG IJ SOLR
125.0000 mg | Freq: Once | INTRAMUSCULAR | Status: AC
  Administered 2021-05-14: 125 mg via INTRAVENOUS
  Filled 2021-05-14: qty 2

## 2021-05-14 MED ORDER — METHYLPREDNISOLONE SODIUM SUCC 40 MG IJ SOLR
40.0000 mg | Freq: Two times a day (BID) | INTRAMUSCULAR | Status: DC
  Administered 2021-05-14 – 2021-05-15 (×3): 40 mg via INTRAVENOUS
  Filled 2021-05-14 (×5): qty 1

## 2021-05-14 MED ORDER — SODIUM CHLORIDE 0.9 % IV SOLN: INTRAVENOUS | Status: DC

## 2021-05-14 MED ORDER — ASPIRIN EC 81 MG PO TBEC
81.0000 mg | DELAYED_RELEASE_TABLET | Freq: Every day | ORAL | Status: DC
  Administered 2021-05-14 – 2021-05-17 (×4): 81 mg via ORAL
  Filled 2021-05-14 (×4): qty 1

## 2021-05-14 MED ORDER — IBUPROFEN 400 MG PO TABS: 600.0000 mg | ORAL_TABLET | Freq: Three times a day (TID) | ORAL | Status: DC | PRN

## 2021-05-14 MED ORDER — MAGNESIUM SULFATE 2 GM/50ML IV SOLN
2.0000 g | Freq: Once | INTRAVENOUS | Status: AC
  Administered 2021-05-14: 2 g via INTRAVENOUS
  Filled 2021-05-14: qty 50

## 2021-05-14 MED ORDER — METOPROLOL TARTRATE 25 MG PO TABS
25.0000 mg | ORAL_TABLET | Freq: Two times a day (BID) | ORAL | Status: DC
  Administered 2021-05-14 – 2021-05-17 (×7): 25 mg via ORAL
  Filled 2021-05-14 (×7): qty 1

## 2021-05-14 MED ORDER — AZITHROMYCIN 500 MG PO TABS
500.0000 mg | ORAL_TABLET | Freq: Every day | ORAL | Status: AC
  Administered 2021-05-14: 500 mg via ORAL
  Filled 2021-05-14: qty 1

## 2021-05-14 MED ORDER — ACETAMINOPHEN 325 MG PO TABS
650.0000 mg | ORAL_TABLET | Freq: Four times a day (QID) | ORAL | Status: DC | PRN
  Administered 2021-05-14 – 2021-05-16 (×3): 650 mg via ORAL
  Filled 2021-05-14 (×3): qty 2

## 2021-05-14 MED ORDER — IPRATROPIUM-ALBUTEROL 0.5-2.5 (3) MG/3ML IN SOLN
3.0000 mL | RESPIRATORY_TRACT | Status: DC
  Administered 2021-05-14 (×4): 3 mL via RESPIRATORY_TRACT
  Filled 2021-05-14 (×4): qty 3

## 2021-05-14 MED ORDER — LISINOPRIL 20 MG PO TABS
20.0000 mg | ORAL_TABLET | Freq: Every day | ORAL | Status: DC
  Administered 2021-05-14 – 2021-05-17 (×4): 20 mg via ORAL
  Filled 2021-05-14 (×4): qty 1

## 2021-05-14 MED ORDER — INSULIN ASPART 100 UNIT/ML IJ SOLN
0.0000 [IU] | Freq: Every day | INTRAMUSCULAR | Status: DC
  Administered 2021-05-14: 2 [IU] via SUBCUTANEOUS
  Filled 2021-05-14: qty 1

## 2021-05-14 NOTE — ED Notes (Signed)
Pt ambulatory to and from bedside RR. No acute distress noted at this time.

## 2021-05-14 NOTE — TOC Progression Note (Addendum)
Transition of Care [facility]) - Progression Note    Patient Details  Name: Tanya Cole MRN: [id number] Date of Birth: Sep 17, 1933  [facility] of [facility] CM/SW Contact  Barrie Dunker, RN Phone Number: 05/14/2021, 8:38 AM  Clinical Narrative:     Patient is from home and has a daughter that is her POA, She has inhalers at home but not using Chronic Oxygen at home, Called the daughter Victorino Dike while in the room with the patient and both the patient and the daughter confirm that she does not have any Needs unless she will need oxygen, She has a Rolling walker at home, family provides assistance and transportation, she can afford her medications. TOC CM to continue to Monitor for Oxygen needs,        Expected Discharge Plan and Services                                                 Social Determinants of Health (SDOH) Interventions    Readmission Risk Interventions No flowsheet data found.

## 2021-05-14 NOTE — H&P (Addendum)
History and Physical    [name] ZOX:[id number] DOB: Jan 18, 1933 DOA: 05/14/2021  Referring MD/NP/PA:   PCP: Dale Cotulla, MD   Patient coming from:  The patient is coming from home.    Chief Complaint: SOB  HPI: Tanya Cole is a 87 y.o. female with medical history significant of hypertension, hyperlipidemia, diabetes mellitus, asthma, COPD not using chronic oxygen at home, PMR, CAD, myocardial infarction, dCHF, aortic insufficiency, anemia, who presents with shortness of breath.  Pt states that her shortness of breath has been progressively worsening since yesterday morning.  She has mild dry cough, wheezing, no fever or chills.  Patient states that she had some chest pressure and tightness earlier, which has resolved, currently denies any chest pain or chest pressure.  Patient does not have nausea, vomiting, diarrhea or abdominal pain.  No symptoms of UTI.  ED Course: pt was found to have negative COVID 19, WBC 11.2, troponin level 7 --> 8, potassium 3.4, renal function normal, temperature normal, blood pressure 150/75, heart rate 102, RR 30, oxygen saturation 89% on room air, which improved with 98% on 2 L oxygen.  Chest x-ray negative.  Patient is admitted to MedSurg bed as inpatient.  Review of Systems:   General: no fevers, chills, no body weight gain, has fatigue HEENT: no blurry vision, hearing changes or sore throat Respiratory: has dyspnea, coughing, wheezing CV: had chest pressure, no palpitations GI: no nausea, vomiting, abdominal pain, diarrhea, constipation GU: no dysuria, burning on urination, increased urinary frequency, hematuria  Ext: no leg edema Neuro: no unilateral weakness, numbness, or tingling, no vision change or hearing loss Skin: no rash, no skin tear. MSK: No muscle spasm, no deformity, no limitation of range of movement in spin Heme: No easy bruising.  Travel history: No recent long distant travel.  Allergy:  Allergies  Allergen  Reactions   Tramadol Itching and Nausea And Vomiting    Past Medical History:  Diagnosis Date   Aortic insufficiency    a. TTE 12/19: EF 55-60%, probable HK of the mid apical anterior septal myocardium, Gr1DD, mild AI, mildly dilated LA   Asthma    CAD (coronary artery disease)    a. NSTEMI 12/19; b. LHC 10/08/18: LM minimal luminal irregs, mLAD-1 95% s/p PCI/DES, mLAD-2 60%, LCx mild diffuse disease throughout, RCA minimal luminal irregs; b. 03/2020 MV: EF>65%, no ischemia/scar.   CHF (congestive heart failure) (HCC)    Diabetes mellitus (HCC)    Hypercholesterolemia    Hypertension    Myocardial infarction (HCC)    Osteopenia    Palpitations    a. 04/2020 Zio: Avg HR 75. 429 SVT episodes, longest 19 secs @ 133. Occas PACs (3.2%). Rare PVCs (<1%).   Polymyalgia rheumatica syndrome (HCC)    Reactive airway disease     Past Surgical History:  Procedure Laterality Date   ABDOMINAL HYSTERECTOMY  1981   prolapse and bleeding, ovaries not removed   BREAST EXCISIONAL BIOPSY Right    CHOLECYSTECTOMY N/A 09/02/2019   Procedure: LAPAROSCOPIC CHOLECYSTECTOMY WITH INTRAOPERATIVE CHOLANGIOGRAM;  Surgeon: Henrene Dodge, MD;  Location: [facility];  Service: General;  Laterality: N/A;   CORONARY STENT INTERVENTION N/A 10/08/2018   Procedure: CORONARY STENT INTERVENTION;  Surgeon: Iran Ouch, MD;  Location: [facility];  Service: Cardiovascular;  Laterality: N/A;   ENDOSCOPIC RETROGRADE CHOLANGIOPANCREATOGRAPHY (ERCP) WITH PROPOFOL N/A 08/08/2019   Procedure: ENDOSCOPIC RETROGRADE CHOLANGIOPANCREATOGRAPHY (ERCP) WITH PROPOFOL;  Surgeon: Midge Minium, MD;  Location: [facility];  Service: Endoscopy;  Laterality: N/A;   LEFT HEART CATH AND CORONARY ANGIOGRAPHY N/A 10/08/2018   Procedure: LEFT HEART CATH AND CORONARY ANGIOGRAPHY;  Surgeon: Iran Ouch, MD;  Location: [facility];  Service: Cardiovascular;  Laterality: N/A;   LEFT HEART CATH AND CORONARY ANGIOGRAPHY N/A  08/10/2020   Procedure: LEFT HEART CATH AND CORONARY ANGIOGRAPHY possible percutaneous intervention;  Surgeon: Iran Ouch, MD;  Location: [facility];  Service: Cardiovascular;  Laterality: N/A;   UMBILICAL HERNIA REPAIR  7/94    Social History:  reports that she has never smoked. She has never used smokeless tobacco. She reports that she does not drink alcohol and does not use drugs.  Family History:  Family History  Problem Relation Age of Onset   Arthritis Mother    Heart disease Mother    Heart attack Father    Throat cancer Sister    Parkinson's disease Sister    COPD Brother      Prior to Admission medications   Medication Sig Start Date End Date Taking? Authorizing Provider  ADVAIR DISKUS 250-50 MCG/DOSE AEPB INHALE ONE PUFF BY MOUTH EVERY 12 HOURS. RINSE MOUTH AFTER EACH USE 02/17/16  Yes Dale Westport, MD  albuterol (PROVENTIL) (2.5 MG/3ML) 0.083% nebulizer solution USE 1 VIAL IN NEBULIZER EVERY 4 HOURS AS NEEDED FOR WHEEZING FOR SHORTNESS OF BREATH 05/12/21  Yes Dale Noxon, MD  alendronate (FOSAMAX) 70 MG tablet Take 70 mg by mouth once a week.  11/06/17  Yes [provider]  aspirin 81 MG tablet Take 81 mg by mouth daily.   Yes [provider]  atorvastatin (LIPITOR) 80 MG tablet TAKE 1 TABLET BY MOUTH ONCE DAILY AT  6PM 05/04/21  Yes Alver Sorrow, NP  fluticasone (FLONASE) 50 MCG/ACT nasal spray Use 2 spray(s) in each nostril once daily 08/03/20  Yes Dale Rabun, MD  lisinopril (ZESTRIL) 20 MG tablet Take 1 tablet by mouth once daily 04/05/21  Yes Dunn, Ryan M, PA-C  metFORMIN (GLUCOPHAGE) 500 MG tablet TAKE 1 TABLET BY MOUTH TWICE DAILY WITH A MEAL 09/29/20  Yes Dale Cazenovia, MD  metoprolol tartrate (LOPRESSOR) 25 MG tablet Take 1 tablet by mouth twice daily 05/11/21  Yes Iran Ouch, MD  Vitamin D, Ergocalciferol, (DRISDOL) 50000 units CAPS capsule Take 50,000 Units by mouth once a week. 06/24/18  Yes [provider]   ibuprofen (ADVIL) 600 MG tablet Take 1 tablet (600 mg total) by mouth every 8 (eight) hours as needed for mild pain or moderate pain. 03/18/21   Merwyn Katos, MD  nitroGLYCERIN (NITROSTAT) 0.4 MG SL tablet Place 1 tablet (0.4 mg total) under the tongue every 5 (five) minutes as needed for chest pain. 10/09/18   Adrian Saran, MD  PROAIR HFA 108 (90 Base) MCG/ACT inhaler Inhale 2 puffs into the lungs every 4 (four) hours as needed for wheezing or shortness of breath. 08/20/20   Rolly Salter, MD    Physical Exam: Vitals:   05/14/21 0530 05/14/21 6659 05/14/21 0821 05/14/21 0832  BP: (!) 153/78 (!) 150/75 132/64   Pulse: 95 90 (!) 101   Resp: 20 18    Temp: 98.8 F (37.1 C) 97.8 F (36.6 C) 98.5 F (36.9 C)   TempSrc: Oral Oral Oral   SpO2: 95% 98% 97% 98%   General: Not in acute distress HEENT:       Eyes: PERRL, EOMI, no scleral icterus.       ENT: No discharge from the ears  and nose, no pharynx injection, no tonsillar enlargement.        Neck: No JVD, no bruit, no mass felt. Heme: No neck lymph node enlargement. Cardiac: S1/S2, RRR, No murmurs, No gallops or rubs. Respiratory: Has wheezing bilaterally, left side is worse than the right GI: Soft, nondistended, nontender, no rebound pain, no organomegaly, BS present. GU: No hematuria Ext: No pitting leg edema bilaterally. 1+DP/PT pulse bilaterally. Musculoskeletal: No joint deformities, No joint redness or warmth, no limitation of ROM in spin. Skin: No rashes.  Neuro: Alert, oriented X3, cranial nerves II-XII grossly intact, moves all extremities normally.  Psych: Patient is not psychotic, no suicidal or hemocidal ideation.  Labs on Admission: I have personally reviewed following labs and imaging studies  CBC: Recent Labs  Lab 05/12/21 0715 05/14/21 0042  WBC 9.1 11.2*  HGB 13.3 12.5  HCT 39.2 36.5  MCV 86.0 88.2  PLT 225 247   Basic Metabolic Panel: Recent Labs  Lab 05/12/21 0715 05/14/21 0042 05/14/21 0737   NA 142 139  --   K 3.5 3.4*  --   CL 107 105  --   CO2 26 25  --   GLUCOSE 193* 227*  --   BUN 13 12  --   CREATININE 0.61 0.65  --   CALCIUM 9.4 9.3  --   MG  --   --  1.5*   GFR: Estimated Creatinine Clearance: 38.8 mL/min (by C-G formula based on SCr of 0.65 mg/dL). Liver Function Tests: Recent Labs  Lab 05/12/21 0715  AST 16  ALT 15  ALKPHOS 70  BILITOT 1.2  PROT 6.9  ALBUMIN 3.9   No results for input(s): LIPASE, AMYLASE in the last 168 hours. No results for input(s): AMMONIA in the last 168 hours. Coagulation Profile: No results for input(s): INR, PROTIME in the last 168 hours. Cardiac Enzymes: No results for input(s): CKTOTAL, CKMB, CKMBINDEX, TROPONINI in the last 168 hours. BNP (last 3 results) No results for input(s): PROBNP in the last 8760 hours. HbA1C: No results for input(s): HGBA1C in the last 72 hours. CBG: No results for input(s): GLUCAP in the last 168 hours. Lipid Profile: No results for input(s): CHOL, HDL, LDLCALC, TRIG, CHOLHDL, LDLDIRECT in the last 72 hours. Thyroid Function Tests: No results for input(s): TSH, T4TOTAL, FREET4, T3FREE, THYROIDAB in the last 72 hours. Anemia Panel: No results for input(s): VITAMINB12, FOLATE, FERRITIN, TIBC, IRON, RETICCTPCT in the last 72 hours. Urine analysis:    Component Value Date/Time   COLORURINE YELLOW (A) 03/18/2021 1019   APPEARANCEUR CLOUDY (A) 03/18/2021 1019   APPEARANCEUR Clear 02/18/2013 2004   LABSPEC 1.021 03/18/2021 1019   LABSPEC 1.024 02/18/2013 2004   PHURINE 5.0 03/18/2021 1019   GLUCOSEU NEGATIVE 03/18/2021 1019   GLUCOSEU NEGATIVE 01/28/2015 0810   HGBUR NEGATIVE 03/18/2021 1019   BILIRUBINUR NEGATIVE 03/18/2021 1019   BILIRUBINUR neg 01/16/2015 1437   BILIRUBINUR Negative 02/18/2013 2004   KETONESUR NEGATIVE 03/18/2021 1019   PROTEINUR NEGATIVE 03/18/2021 1019   UROBILINOGEN 0.2 01/28/2015 0810   NITRITE NEGATIVE 03/18/2021 1019   LEUKOCYTESUR TRACE (A) 03/18/2021 1019    LEUKOCYTESUR Trace 02/18/2013 2004   Sepsis Labs: @LABRCNTIP (procalcitonin:4,lacticidven:4) ) Recent Results (from the past 240 hour(s))  Resp Panel by RT-PCR (Flu A&B, Covid) Nasopharyngeal Swab     Status: None   Collection Time: 05/14/21  3:39 AM   Specimen: Nasopharyngeal Swab; Nasopharyngeal(NP) swabs in vial transport medium  Result Value Ref Range Status   SARS Coronavirus 2  by RT PCR NEGATIVE NEGATIVE Final    Comment: (NOTE) SARS-CoV-2 target nucleic acids are NOT DETECTED.  The SARS-CoV-2 RNA is generally detectable in upper respiratory specimens during the acute phase of infection. The lowest concentration of SARS-CoV-2 viral copies this assay can detect is 138 copies/mL. A negative result does not preclude SARS-Cov-2 infection and should not be used as the sole basis for treatment or other patient management decisions. A negative result may occur with  improper specimen collection/handling, submission of specimen other than nasopharyngeal swab, presence of viral mutation(s) within the areas targeted by this assay, and inadequate number of viral copies(<138 copies/mL). A negative result must be combined with clinical observations, patient history, and epidemiological information. The expected result is Negative.  Fact Sheet for Patients:  BloggerCourse.com  Fact Sheet for Healthcare Providers:  SeriousBroker.it  This test is no t yet approved or cleared by the [place] FDA and  has been authorized for detection and/or diagnosis of SARS-CoV-2 by FDA under an Emergency Use Authorization (EUA). This EUA will remain  in effect (meaning this test can be used) for the duration of the COVID-19 declaration under Section 564(b)(1) of the Act, 21 U.S.C.section 360bbb-3(b)(1), unless the authorization is terminated  or revoked sooner.       Influenza A by PCR NEGATIVE NEGATIVE Final   Influenza B by PCR NEGATIVE  NEGATIVE Final    Comment: (NOTE) The Xpert Xpress SARS-CoV-2/FLU/RSV plus assay is intended as an aid in the diagnosis of influenza from Nasopharyngeal swab specimens and should not be used as a sole basis for treatment. Nasal washings and aspirates are unacceptable for Xpert Xpress SARS-CoV-2/FLU/RSV testing.  Fact Sheet for Patients: BloggerCourse.com  Fact Sheet for Healthcare Providers: SeriousBroker.it  This test is not yet approved or cleared by the [place] FDA and has been authorized for detection and/or diagnosis of SARS-CoV-2 by FDA under an Emergency Use Authorization (EUA). This EUA will remain in effect (meaning this test can be used) for the duration of the COVID-19 declaration under Section 564(b)(1) of the Act, 21 U.S.C. section 360bbb-3(b)(1), unless the authorization is terminated or revoked.  Performed at [facility], [address], [place], [place] [address]      Radiological Exams on Admission: DG Chest Portable 1 View  Result Date: 05/14/2021 CLINICAL DATA:  Chest pain EXAM: PORTABLE CHEST 1 VIEW COMPARISON:  05/12/2021 FINDINGS: Cardiac shadow is stable. Aortic calcifications are noted. Lungs are well aerated bilaterally. Calcified granuloma in the left base is seen. No bony abnormality is noted. IMPRESSION: No acute abnormality noted. Electronically Signed   By: Alcide Clever M.D.   On: 05/14/2021 01:25     EKG: I have personally reviewed.  Sinus rhythm, QTC 468, early R wave progression, minimal T wave inversion in V4-V5.  Assessment/Plan Principal Problem:   COPD exacerbation (HCC) Active Problems:   Hypertension   Hypercholesterolemia   Diabetes mellitus with cardiac complication (HCC)   Chronic diastolic CHF (congestive heart failure) (HCC)   CAD (coronary artery disease)   Severe sepsis (HCC)   Hypokalemia   Acute on chronic respiratory failure with hypoxia (HCC)   Asthma  exacerbation   Hypomagnesemia    Acute on chronic respiratory failure with hypoxia and severe sepsis due to COPD/Asthma exacerbation: Patient has worsening shortness breath, wheezing, mild dry cough, negative chest x-ray, clinically consistent with COPD/asthma exacerbation.  Patient meets criteria for sepsis with tachycardia with heart rate 102 and tachypnea with RR 30. Lactic acid level 3.4  Also has mild leukocytosis with WBC 11.2.  - will admit to med-surg as inpatient -Bronchodilators -Solu-Medrol 40 mg IV bid -Z pak  -Mucinex for cough  -Incentive spirometry -Follow up sputum culture -Nasal cannula oxygen as needed to maintain O2 saturation 93% or greater -will get Procalcitonin and trend lactic acid levels per sepsis protocol. -IVF: 1L NS bolus and then 75 cc/h.  Patient has CHF, limiting aggressive IV fluid treatment.  Hypertension: -IV hydralazine as needed -Lisinopril, metoprolol  Hypercholesterolemia -Lipitor  Diabetes mellitus with cardiac complication [facility]): Recent A1c is 7.3, poorly controlled.  Patient is taking metformin at home -SSI  Chronic diastolic CHF (congestive heart failure) (HCC): 2D echo on 08/08/2020 showed EF 60-65% with grade 2 diastolic dysfunction.  Patient does not have leg edema.  Chest x-ray negative for pulmonary edema.  CHF seem to be compensated. -Check BNP  CAD (coronary artery disease): Patient reports chest tightness and pressure earlier, which has resolved.  Troponin negative x2. -Continue aspirin, Lipitor, metoprolol -As needed nitroglycerin  Hypokalemia and hypomagnesemia: K= 3.4 on admission and Mg 1.5 - Repleted both  DVT ppx: SQ Lovenox Code Status: DNR per pt and her daughter who is POA Family Communication: Yes, patient's  daughter by phone Disposition Plan:  Anticipate discharge back to previous environment Consults called:  none Admission status and Level of care: Med-Surg:     as inpt        Status is:  Inpatient  Remains inpatient appropriate because:Inpatient level of care appropriate due to severity of illness  Dispo: The patient is from: Home              Anticipated d/c is to: Home              Patient currently is not medically stable to d/c.   Difficult to place patient No          Date of Service 05/14/2021    Lorretta Harp [facility]   If 7PM-7AM, please contact night-coverage [email or web address] 05/14/2021, 8:37 AM

## 2021-05-14 NOTE — ED Provider Notes (Signed)
[facility] Emergency Department Provider Note  ____________________________________________  Time seen: Approximately 3:36 AM  I have reviewed the triage vital signs and the nursing notes.   HISTORY  Chief Complaint No chief complaint on file.   HPI Tanya Cole is a 87 y.o. female with a history of asthma, COPD, CAD, CHF with preserved EF, diabetes, hypertension, hyperlipidemia, polymyalgia rheumatica  who presents for evaluation of shortness of breath, wheezing and chest pain.  Patient reports midline chest pressure that started this evening.  She has a very mild cough.  Has been wheezing a lot.  Has been using her inhalers however this evening they were not working.  Denies any fever or chills, sore throat, vomiting or diarrhea.  Denies any pleuritic chest pain, personal or family history.  DVT, recent travel immobilization, leg pain or swelling, hemoptysis, or exogenous hormones.  She is not on any blood thinners.  Past Medical History:  Diagnosis Date   Aortic insufficiency    a. TTE 12/19: EF 55-60%, probable HK of the mid apical anterior septal myocardium, Gr1DD, mild AI, mildly dilated LA   Asthma    CAD (coronary artery disease)    a. NSTEMI 12/19; b. LHC 10/08/18: LM minimal luminal irregs, mLAD-1 95% s/p PCI/DES, mLAD-2 60%, LCx mild diffuse disease throughout, RCA minimal luminal irregs; b. 03/2020 MV: EF>65%, no ischemia/scar.   CHF (congestive heart failure) (HCC)    Diabetes mellitus (HCC)    Hypercholesterolemia    Hypertension    Myocardial infarction (HCC)    Osteopenia    Palpitations    a. 04/2020 Zio: Avg HR 75. 429 SVT episodes, longest 19 secs @ 133. Occas PACs (3.2%). Rare PVCs (<1%).   Polymyalgia rheumatica syndrome (HCC)    Reactive airway disease     Patient Active Problem List   Diagnosis Date Noted   Joint ache 04/09/2021   Calcified granuloma of lung (HCC) 03/02/2021   Acute exacerbation of chronic obstructive  pulmonary disease (COPD) (HCC) 03/01/2021   COPD exacerbation (HCC) 11/29/2020   Chronic diastolic CHF (congestive heart failure) (HCC) 11/29/2020   CAP (community acquired pneumonia) 11/29/2020   Rib pain on right side 10/19/2020   Abnormal CXR 10/19/2020   Type 2 diabetes mellitus with cardiac complication (HCC) 10/08/2020   Elevated troponin 10/08/2020   Acute on chronic heart failure with preserved ejection fraction (HFpEF) (HCC) 10/08/2020   COPD with acute exacerbation (HCC) 08/19/2020   History of non-ST elevation myocardial infarction (NSTEMI) 08/07/2020   Asthma 08/07/2020   Leukocytosis 08/07/2020   Left shoulder pain 07/12/2020   Iron deficiency 04/16/2020   Anemia 04/04/2020   SOB (shortness of breath) 03/25/2020   Cough 09/30/2019   Gallstone pancreatitis    Pre-op evaluation 08/30/2019   Cholecystitis 08/05/2019   Choledocholithiasis    Respiratory illness 06/18/2019   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) 12/29/2018   Chest pain 10/07/2018   Memory change 05/27/2018   Osteoporosis 02/05/2018   Weight loss 06/24/2017   Abdominal pain, left lower quadrant 10/05/2016   Long term current use of systemic steroids 05/16/2016   Elevated erythrocyte sedimentation rate 05/04/2016   Back pain 04/29/2016   Fatigue 05/24/2015   Health care maintenance 01/25/2015   UTI (urinary tract infection) 07/07/2014   Neuropathy 03/24/2014   Diverticulitis 02/24/2013   Reactive airway disease 09/15/2012   Hypertension 09/14/2012   Hypercholesterolemia 09/14/2012   Diabetes mellitus with cardiac complication (HCC) 09/14/2012  Past Surgical History:  Procedure Laterality Date   ABDOMINAL HYSTERECTOMY  1981   prolapse and bleeding, ovaries not removed   BREAST EXCISIONAL BIOPSY Right    CHOLECYSTECTOMY N/A 09/02/2019   Procedure: LAPAROSCOPIC CHOLECYSTECTOMY WITH INTRAOPERATIVE CHOLANGIOGRAM;  Surgeon: Henrene Dodge, MD;  Location: [facility] ORS;   Service: General;  Laterality: N/A;   CORONARY STENT INTERVENTION N/A 10/08/2018   Procedure: CORONARY STENT INTERVENTION;  Surgeon: Iran Ouch, MD;  Location: [facility] INVASIVE CV LAB;  Service: Cardiovascular;  Laterality: N/A;   ENDOSCOPIC RETROGRADE CHOLANGIOPANCREATOGRAPHY (ERCP) WITH PROPOFOL N/A 08/08/2019   Procedure: ENDOSCOPIC RETROGRADE CHOLANGIOPANCREATOGRAPHY (ERCP) WITH PROPOFOL;  Surgeon: Midge Minium, MD;  Location: [facility] ENDOSCOPY;  Service: Endoscopy;  Laterality: N/A;   LEFT HEART CATH AND CORONARY ANGIOGRAPHY N/A 10/08/2018   Procedure: LEFT HEART CATH AND CORONARY ANGIOGRAPHY;  Surgeon: Iran Ouch, MD;  Location: [facility] INVASIVE CV LAB;  Service: Cardiovascular;  Laterality: N/A;   LEFT HEART CATH AND CORONARY ANGIOGRAPHY N/A 08/10/2020   Procedure: LEFT HEART CATH AND CORONARY ANGIOGRAPHY possible percutaneous intervention;  Surgeon: Iran Ouch, MD;  Location: [facility] INVASIVE CV LAB;  Service: Cardiovascular;  Laterality: N/A;   UMBILICAL HERNIA REPAIR  7/94    Prior to Admission medications   Medication Sig Start Date End Date Taking? Authorizing Provider  ADVAIR DISKUS 250-50 MCG/DOSE AEPB INHALE ONE PUFF BY MOUTH EVERY 12 HOURS. RINSE MOUTH AFTER EACH USE 02/17/16  Yes Dale Winchester, MD  albuterol (PROVENTIL) (2.5 MG/3ML) 0.083% nebulizer solution USE 1 VIAL IN NEBULIZER EVERY 4 HOURS AS NEEDED FOR WHEEZING FOR SHORTNESS OF BREATH 05/12/21  Yes Dale Smoot, MD  alendronate (FOSAMAX) 70 MG tablet Take 70 mg by mouth once a week.  11/06/17  Yes [provider]  aspirin 81 MG tablet Take 81 mg by mouth daily.   Yes [provider]  atorvastatin (LIPITOR) 80 MG tablet TAKE 1 TABLET BY MOUTH ONCE DAILY AT  6PM 05/04/21  Yes Alver Sorrow, NP  fluticasone (FLONASE) 50 MCG/ACT nasal spray Use 2 spray(s) in each nostril once daily 08/03/20  Yes Dale [place], MD  lisinopril (ZESTRIL) 20 MG tablet Take 1 tablet by mouth once daily 04/05/21  Yes Dunn,  Ryan M, PA-C  metFORMIN (GLUCOPHAGE) 500 MG tablet TAKE 1 TABLET BY MOUTH TWICE DAILY WITH A MEAL 09/29/20  Yes Dale [place], MD  metoprolol tartrate (LOPRESSOR) 25 MG tablet Take 1 tablet by mouth twice daily 05/11/21  Yes Iran Ouch, MD  Vitamin D, Ergocalciferol, (DRISDOL) 50000 units CAPS capsule Take 50,000 Units by mouth once a week. 06/24/18  Yes [provider]  ibuprofen (ADVIL) 600 MG tablet Take 1 tablet (600 mg total) by mouth every 8 (eight) hours as needed for mild pain or moderate pain. 03/18/21   Merwyn Katos, MD  nitroGLYCERIN (NITROSTAT) 0.4 MG SL tablet Place 1 tablet (0.4 mg total) under the tongue every 5 (five) minutes as needed for chest pain. 10/09/18   Adrian Saran, MD  PROAIR HFA 108 (90 Base) MCG/ACT inhaler Inhale 2 puffs into the lungs every 4 (four) hours as needed for wheezing or shortness of breath. 08/20/20   Rolly Salter, MD    Allergies Tramadol  Family History  Problem Relation Age of Onset   Arthritis Mother    Heart disease Mother    Heart attack Father    Throat cancer Sister    Parkinson's disease Sister    COPD Brother     Social History Social History  Tobacco Use   Smoking status: Never   Smokeless tobacco: Never  Vaping Use   Vaping Use: Never used  Substance Use Topics   Alcohol use: No    Alcohol/week: 0.0 standard drinks   Drug use: No    Review of Systems  Constitutional: Negative for fever. Eyes: Negative for visual changes. ENT: Negative for sore throat. Neck: No neck pain  Cardiovascular: Negative for chest pain. + Chest tightness Respiratory: + shortness of breath, wheezing Gastrointestinal: Negative for abdominal pain, vomiting or diarrhea. Genitourinary: Negative for dysuria. Musculoskeletal: Negative for back pain. Skin: Negative for rash. Neurological: Negative for headaches, weakness or numbness. Psych: No SI or HI  ____________________________________________   PHYSICAL EXAM:  VITAL  SIGNS: ED Triage Vitals  Enc Vitals Group     BP 05/14/21 0041 135/83     Pulse Rate 05/14/21 0041 85     Resp 05/14/21 0041 (!) 30     Temp 05/14/21 0041 99.7 F (37.6 C)     Temp Source 05/14/21 0041 Oral     SpO2 05/14/21 0041 92 %     Weight --      Height --      Head Circumference --      Peak Flow --      Pain Score 05/14/21 0043 0     Pain Loc --      Pain Edu? --      Excl. in GC? --     Constitutional: Alert and oriented. Well appearing and in no apparent distress. HEENT:      Head: Normocephalic and atraumatic.         Eyes: Conjunctivae are normal. Sclera is non-icteric.       Mouth/Throat: Mucous membranes are moist.       Neck: Supple with no signs of meningismus. Cardiovascular: Regular rate and rhythm. No murmurs, gallops, or rubs. 2+ symmetrical distal pulses are present in all extremities. No JVD. Respiratory: Increased work of breathing, tachypneic, hypoxic to the low 90s on 2 L oxygen, diffuse wheezing bilaterally Gastrointestinal: Soft, non tender, and non distended. Musculoskeletal:  No edema, cyanosis, or erythema of extremities. Neurologic: Normal speech and language. Face is symmetric. Moving all extremities. No gross focal neurologic deficits are appreciated. Skin: Skin is warm, dry and intact. No rash noted. Psychiatric: Mood and affect are normal. Speech and behavior are normal.  ____________________________________________   LABS (all labs ordered are listed, but only abnormal results are displayed)  Labs Reviewed  CBC - Abnormal; Notable for the following components:      Result Value   WBC 11.2 (*)    All other components within normal limits  BASIC METABOLIC PANEL - Abnormal; Notable for the following components:   Potassium 3.4 (*)    Glucose, Bld 227 (*)    All other components within normal limits  RESP PANEL BY RT-PCR (FLU A&B, COVID) ARPGX2  TROPONIN I (HIGH SENSITIVITY)  TROPONIN I (HIGH SENSITIVITY)    ____________________________________________  EKG  ED ECG REPORT I, Nita Sickle, the attending physician, personally viewed and interpreted this ECG.  Sinus rhythm with rate of 85, normal intervals, normal axis, diffuse T wave flattening with no ST elevations or depressions ____________________________________________  RADIOLOGY  I have personally reviewed the images performed during this visit and I agree with the Radiologist's read.   Interpretation by Radiologist:  DG Chest Portable 1 View  Result Date: 05/14/2021 CLINICAL DATA:  Chest pain EXAM: PORTABLE CHEST 1 VIEW COMPARISON:  05/12/2021  FINDINGS: Cardiac shadow is stable. Aortic calcifications are noted. Lungs are well aerated bilaterally. Calcified granuloma in the left base is seen. No bony abnormality is noted. IMPRESSION: No acute abnormality noted. Electronically Signed   By: Alcide Clever M.D.   On: 05/14/2021 01:25     ____________________________________________   PROCEDURES  Procedure(s) performed:yes .1-3 Lead EKG Interpretation  Date/Time: 05/14/2021 3:42 AM Performed by: Nita Sickle, MD Authorized by: Nita Sickle, MD     Interpretation: non-specific     ECG rate assessment: normal     Rhythm: sinus rhythm     Ectopy: none     Conduction: normal    Critical Care performed: yes  CRITICAL CARE Performed by: Nita Sickle  ?  Total critical care time: 30 min  Critical care time was exclusive of separately billable procedures and treating other patients.  Critical care was necessary to treat or prevent imminent or life-threatening deterioration.  Critical care was time spent personally by me on the following activities: development of treatment plan with patient and/or surrogate as well as nursing, discussions with consultants, evaluation of patient's response to treatment, examination of patient, obtaining history from patient or surrogate, ordering and performing treatments  and interventions, ordering and review of laboratory studies, ordering and review of radiographic studies, pulse oximetry and re-evaluation of patient's condition.  ____________________________________________   INITIAL IMPRESSION / ASSESSMENT AND PLAN / ED COURSE   87 y.o. female with a history of asthma, COPD, CAD, CHF with preserved EF, diabetes, hypertension, hyperlipidemia, polymyalgia rheumatica  who presents for evaluation of shortness of breath, wheezing and chest pain.  Patient arrives with sats in the low 90s, wheezing diffusely but looks euvolemic.  Initial EKG showing no acute ischemic changes.  Ddx asthma versus COPD exacerbation, ACS, CHF exacerbation, COVID, flu, pneumonia  Patient started on 3 duo nebs and Solu-Medrol.  Chest x-ray visualized by me with no signs of edema or pneumonia, confirmed by radiology.  2 high-sensitivity troponins were normal.  No significant electrolyte derangements or signs of sepsis.  COVID and flu are pending.  Patient reassessed after breathing treatments and continues to wheeze and remains hypoxic to 88% without oxygen therefore will discuss to hospitalist for admission.  Patient also noted on telemetry to have a very short run of A. fib.  She has no history of such.  Patient will remain on telemetry for close monitoring.  Old medical records reviewed.  History gathered from patient and her daughter was at bedside.  Plan discussed with both of them.     _____________________________________________ Please note:  Patient was evaluated in Emergency Department today for the symptoms described in the history of present illness. Patient was evaluated in the context of the global COVID-19 pandemic, which necessitated consideration that the patient might be at risk for infection with the SARS-CoV-2 virus that causes COVID-19. Institutional protocols and algorithms that pertain to the evaluation of patients at risk for COVID-19 are in a state of rapid change  based on information released by regulatory bodies including the CDC and federal and state organizations. These policies and algorithms were followed during the patient's care in the ED.  Some ED evaluations and interventions may be delayed as a result of limited staffing during the pandemic.   [place] Controlled Substance Database was reviewed by me. ____________________________________________   FINAL CLINICAL IMPRESSION(S) / ED DIAGNOSES   Final diagnoses:  Acute respiratory failure with hypoxia (HCC)  COPD exacerbation (HCC)      NEW MEDICATIONS STARTED DURING  THIS VISIT:  ED Discharge Orders     None        Note:  This document was prepared using Dragon voice recognition software and may include unintentional dictation errors.    Don Perking, [place], MD 05/14/21 [phone]

## 2021-05-14 NOTE — ED Triage Notes (Signed)
Pt complains of chest pressure midline.  Was relieved by 2 rounds of nitro w ems.  Was dc'd from er for same thing 2 days ago.

## 2021-05-14 NOTE — Plan of Care (Signed)

## 2021-05-14 NOTE — ED Notes (Signed)
Pt supplemental O2 removed after neb tx and steroids for 1 hour. O2 sats dropped to 89-90 % at rest.  MD aware and placed O2 back on via [place] at 2L.

## 2021-05-14 NOTE — Evaluation (Signed)
Physical Therapy Evaluation Patient Details Name: Tanya Cole MRN: [id number] DOB: Dec 18, 1932 Today's Date: 05/14/2021   History of Present Illness  presented to ER secondary to SOB, cough; admitted for management of acute/chronic respiratory failure with hypoxia, severe sepsis due to COPD/asthma exacerbation.  Clinical Impression  Upon evaluation, patient alert and oriented; follows commands and agreeable to participation with session.  Denies pain at this time; endorses noted improvement in respiratory status since admission.  Bilat UE/LE strength and ROM grossly symmetrical and WFL; no focal weakness appreciated.  Able to complete bed mobility with cga; sit/stand, basic transfers and gait (75') without assist device, cga/min assist.  Demonstrates decreased step height/length, limited trunk rotation and arm swing; decreased cadence and gait speed. Mod SOB with exertion, BORG 5/10; sats >94% on RA.  Limited dynamic balance/functional reach evident, but good awareness of limits of stability, good use of compensatory strategies as needed. May consider trial of assist device next session for energy conservation purposes pending functional status (patient not interested this date). Would benefit from skilled PT to address above deficits and promote optimal return to PLOF.; Recommend transition to HHPT upon discharge from acute hospitalization.     Follow Up Recommendations Home health PT    Equipment Recommendations  Rolling walker with 5" wheels;3in1 (PT)    Recommendations for Other Services       Precautions / Restrictions Precautions Precautions: Fall Restrictions Weight Bearing Restrictions: No      Mobility  Bed Mobility Overal bed mobility: Needs Assistance Bed Mobility: Supine to Sit     Supine to sit: Min guard          Transfers Overall transfer level: Needs assistance Equipment used: Rolling walker (2 wheeled) Transfers: Sit to/from Stand Sit to Stand: Min  guard            Ambulation/Gait Ambulation/Gait assistance: Min guard Gait Distance (Feet): 75 Feet Assistive device: None       General Gait Details: decreased step height/length, limited trunk rotation and arm swing; decreased cadence and gait speed. Mod SOB with exertion, BORG 5/10; sats >94% on RA.  Stairs            Wheelchair Mobility    Modified Rankin (Stroke Patients Only)       Balance Overall balance assessment: Needs assistance Sitting-balance support: Feet supported;No upper extremity supported Sitting balance-Leahy Scale: Good     Standing balance support: Bilateral upper extremity supported Standing balance-Leahy Scale: Fair                               Pertinent Vitals/Pain Pain Assessment: No/denies pain    Home Living Family/patient expects to be discharged to:: Private residence Living Arrangements: Alone Available Help at Discharge: Family;Available PRN/intermittently Type of Home: House Home Access: Stairs to enter Entrance Stairs-Rails: None Entrance Stairs-Number of Steps: 2 Home Layout: One level Home Equipment: Walker - 2 wheels;Cane - single point;Shower seat      Prior Function Level of Independence: Independent         Comments: Indep with ADLs, household and limited community mobilization without assist device; denies fall history; no home O2.     Hand Dominance        Extremity/Trunk Assessment   Upper Extremity Assessment Upper Extremity Assessment: Overall WFL for tasks assessed    Lower Extremity Assessment Lower Extremity Assessment: Overall WFL for tasks assessed (grossly at least 4+/5 throughout)  Communication   Communication: No difficulties  Cognition Arousal/Alertness: Awake/alert Behavior During Therapy: WFL for tasks assessed/performed Overall Cognitive Status: Within Functional Limits for tasks assessed                                        General  Comments      Exercises Other Exercises Other Exercises: Toilet transfer, ambulatory with RW, cga/min assist; sit/stand from standard toilet, close sup; standing balance at sink for hand hygiene, close sup.  Limited functional reach, but good awareness of limits of stability, good use of compensatory strategies as needed.   Assessment/Plan    PT Assessment Patient needs continued PT services  PT Problem List Decreased strength;Decreased activity tolerance;Decreased balance;Decreased mobility;Decreased safety awareness;Cardiopulmonary status limiting activity       PT Treatment Interventions DME instruction;Gait training;Stair training;Functional mobility training;Therapeutic exercise;Balance training;Therapeutic activities;Patient/family education    PT Goals (Current goals can be found in the Care Plan section)  Acute Rehab PT Goals Patient Stated Goal: to return home PT Goal Formulation: With patient Time For Goal Achievement: 05/28/21 Potential to Achieve Goals: Good    Frequency Min 2X/week   Barriers to discharge        Co-evaluation               AM-PAC PT "6 Clicks" Mobility  Outcome Measure Help needed turning from your back to your side while in a flat bed without using bedrails?: None Help needed moving from lying on your back to sitting on the side of a flat bed without using bedrails?: A Little Help needed moving to and from a bed to a chair (including a wheelchair)?: A Little Help needed standing up from a chair using your arms (e.g., wheelchair or bedside chair)?: A Little Help needed to walk in hospital room?: A Little Help needed climbing 3-5 steps with a railing? : A Little 6 Click Score: 19    End of Session Equipment Utilized During Treatment: Gait belt Activity Tolerance: Patient tolerated treatment well Patient left: in chair;with call bell/phone within reach;with chair alarm set;with family/visitor present Nurse Communication: Mobility  status PT Visit Diagnosis: Muscle weakness (generalized) (M62.81);Difficulty in walking, not elsewhere classified (R26.2)    Time: 4163-8453 PT Time Calculation (min) (ACUTE ONLY): 29 min   Charges:   PT Evaluation $PT Eval Moderate Complexity: 1 Mod PT Treatments $Therapeutic Activity: 8-22 mins        Hines Kloss H. Manson Passey, PT, DPT, NCS 05/14/21, 2:56 PM [phone]

## 2021-05-15 DIAGNOSIS — J9621 Acute and chronic respiratory failure with hypoxia: Secondary | ICD-10-CM

## 2021-05-15 LAB — BASIC METABOLIC PANEL
Anion gap: 7 (ref 5–15)
BUN: 22 mg/dL (ref 8–23)
CO2: 23 mmol/L (ref 22–32)
Calcium: 8.7 mg/dL — ABNORMAL LOW (ref 8.9–10.3)
Chloride: 109 mmol/L (ref 98–111)
Creatinine, Ser: 0.72 mg/dL (ref 0.44–1.00)
GFR, Estimated: >60 mL/min (ref >60)
Glucose, Bld: 272 mg/dL — ABNORMAL HIGH (ref 70–99)
Potassium: 4 mmol/L (ref 3.5–5.1)
Sodium: 139 mmol/L (ref 135–145)

## 2021-05-15 LAB — CBC
HCT: 33.1 % — ABNORMAL LOW (ref 36.0–46.0)
Hemoglobin: 11.1 g/dL — ABNORMAL LOW (ref 12.0–15.0)
MCH: 29.5 pg (ref 26.0–34.0)
MCHC: 33.5 g/dL (ref 30.0–36.0)
MCV: 88 fL (ref 80.0–100.0)
Platelets: 224 10*3/uL (ref 150–400)
RBC: 3.76 MIL/uL — ABNORMAL LOW (ref 3.87–5.11)
RDW: 13.2 % (ref 11.5–15.5)
WBC: 19 10*3/uL — ABNORMAL HIGH (ref 4.0–10.5)
nRBC: 0 % (ref 0.0–0.2)

## 2021-05-15 LAB — GLUCOSE, CAPILLARY
Glucose-Capillary: 275 mg/dL — ABNORMAL HIGH (ref 70–99)
Glucose-Capillary: 235 mg/dL — ABNORMAL HIGH (ref 70–99)
Glucose-Capillary: 161 mg/dL — ABNORMAL HIGH (ref 70–99)
Glucose-Capillary: 184 mg/dL — ABNORMAL HIGH (ref 70–99)

## 2021-05-15 LAB — MAGNESIUM: Magnesium: 1.9 mg/dL (ref 1.7–2.4)

## 2021-05-15 MED ORDER — IPRATROPIUM-ALBUTEROL 0.5-2.5 (3) MG/3ML IN SOLN
3.0000 mL | Freq: Two times a day (BID) | RESPIRATORY_TRACT | Status: DC
  Administered 2021-05-15 – 2021-05-17 (×4): 3 mL via RESPIRATORY_TRACT
  Filled 2021-05-15 (×4): qty 3

## 2021-05-15 MED ORDER — INSULIN ASPART 100 UNIT/ML IJ SOLN
3.0000 [IU] | Freq: Three times a day (TID) | INTRAMUSCULAR | Status: DC
  Administered 2021-05-15 – 2021-05-17 (×7): 3 [IU] via SUBCUTANEOUS
  Filled 2021-05-15 (×8): qty 1

## 2021-05-15 MED ORDER — INSULIN ASPART 100 UNIT/ML IJ SOLN
0.0000 [IU] | Freq: Three times a day (TID) | INTRAMUSCULAR | Status: DC
  Administered 2021-05-15 – 2021-05-16 (×2): 3 [IU] via SUBCUTANEOUS
  Administered 2021-05-16: 5 [IU] via SUBCUTANEOUS
  Administered 2021-05-16: 2 [IU] via SUBCUTANEOUS
  Administered 2021-05-17 (×2): 3 [IU] via SUBCUTANEOUS
  Administered 2021-05-17: 5 [IU] via SUBCUTANEOUS
  Filled 2021-05-15 (×8): qty 1

## 2021-05-15 NOTE — Progress Notes (Signed)
PROGRESS NOTE    Tanya Cole  NFA:[id number] DOB: 03-31-1933 DOA: 05/14/2021 PCP: Dale Leadington, MD   No chief complaint on file.   Brief Narrative:  87 year old female prior history of hypertension hyperlipidemia diabetes, asthma, COPD on room air at home, coronary artery disease, diastolic heart failure presents with shortness of breath since 1 day.  She was admitted for acute COPD exacerbation and initially required 2 L of nasal cannula oxygen to keep sats greater than 90%. Assessment & Plan:   Principal Problem:   COPD exacerbation (HCC) Active Problems:   Hypertension   Hypercholesterolemia   Diabetes mellitus with cardiac complication (HCC)   Chronic diastolic CHF (congestive heart failure) (HCC)   CAD (coronary artery disease)   Severe sepsis (HCC)   Hypokalemia   Acute on chronic respiratory failure with hypoxia (HCC)   Asthma exacerbation   Hypomagnesemia  Acute respiratory failure with hypoxia secondary to COPD/asthma exacerbation Initially requiring up to 2 L of nasal cannula oxygen to keep sats greater than 90%. Sepsis has been ruled out.  Repeat lactic acid. Elevated WBC count probably secondary to steroids continue with Solu-Medrol 40 mg IV twice daily as she continues to have wheezing, continue with bronchodilators and azithromycin.    Chronic diastolic heart failure Last echocardiogram from October 2021 showed preserved left ventricular ejection fraction with grade 2 diastolic dysfunction.  Chest x-ray does not show any pulmonary edema and she does not have pedal edema.  BNP is slightly elevated.  She appears to be compensated at this time and will need for repeat echocardiogram. She denies any chest pain or at this time   History of coronary artery disease No chest pain this morning continue with aspirin Lipitor and metoprolol.   Hypokalemia and hypomagnesemia Replaced.   Essential hypertension Blood pressure parameters appear to be optimal at  this time   Hyperlipidemia Continue with the Lipitor.  Type 2 diabetes mellitus, non-insulin-dependent, poorly controlled with elevated CBGs, hyperglycemia next CBG (last 3)  Recent Labs    05/14/21 1743 05/14/21 2126 05/15/21 0814  GLUCAP 308* 248* 275*   Elevated CBGs probably secondary to steroids we will add NovoLog 3 units 3 times daily before meals and continue with sliding scale insulin.     DVT prophylaxis:  Code Status: Dnr Family Communication:  Disposition:   Status is: Inpatient  Remains inpatient appropriate because:Unsafe d/c plan and IV treatments appropriate due to intensity of illness or inability to take PO  Dispo: The patient is from: Home              Anticipated d/c is to: Home              Patient currently is not medically stable to d/c.   Difficult to place patient No       Consultants:  None.   Procedures: none.   Antimicrobials:  Antibiotics Given (last 72 hours)     Date/Time Action Medication Dose   05/14/21 1041 Given   azithromycin (ZITHROMAX) tablet 500 mg 500 mg   05/15/21 0830 Given   azithromycin (ZITHROMAX) tablet 250 mg 250 mg         Subjective: Some sob on ambulation,   Objective: Vitals:   05/15/21 0349 05/15/21 0500 05/15/21 0746 05/15/21 0819  BP: 122/61  (!) 128/54   Pulse: 72  73   Resp: 16  18   Temp: 98 F (36.7 C)  98.6 F (37 C)   TempSrc:   Oral   SpO2:  97%  97% 93%  Weight:  59.2 kg      Intake/Output Summary (Last 24 hours) at 05/15/2021 1056 Last data filed at 05/15/2021 1002 Gross per 24 hour  Intake 1250.63 ml  Output 700 ml  Net 550.63 ml   Filed Weights   05/15/21 0500  Weight: 59.2 kg    Examination:  General exam: Appears calm and comfortable  Respiratory system:  some tachypnea on talking, wheezing heard posteriorly. Air entry fair.  Cardiovascular system: S1 & S2 heard, RRR. No JVD, No pedal edema. Gastrointestinal system: Abdomen is nondistended, soft and nontender.   Normal bowel sounds heard. Central nervous system: Alert and oriented. No focal neurological deficits. Extremities: Symmetric 5 x 5 power. Skin: No rashes, lesions or ulcers Psychiatry: Mood & affect appropriate.     Data Reviewed: I have personally reviewed following labs and imaging studies  CBC: Recent Labs  Lab 05/12/21 0715 05/14/21 0042 05/15/21 0415  WBC 9.1 11.2* 19.0*  HGB 13.3 12.5 11.1*  HCT 39.2 36.5 33.1*  MCV 86.0 88.2 88.0  PLT 225 247 224    Basic Metabolic Panel: Recent Labs  Lab 05/12/21 0715 05/14/21 0042 05/14/21 0737 05/15/21 0415  NA 142 139  --  139  K 3.5 3.4*  --  4.0  CL 107 105  --  109  CO2 26 25  --  23  GLUCOSE 193* 227*  --  272*  BUN 13 12  --  22  CREATININE 0.61 0.65  --  0.72  CALCIUM 9.4 9.3  --  8.7*  MG  --   --  1.5* 1.9    GFR: Estimated Creatinine Clearance: 42.5 mL/min (by C-G formula based on SCr of 0.72 mg/dL).  Liver Function Tests: Recent Labs  Lab 05/12/21 0715  AST 16  ALT 15  ALKPHOS 70  BILITOT 1.2  PROT 6.9  ALBUMIN 3.9    CBG: Recent Labs  Lab 05/14/21 0838 05/14/21 1223 05/14/21 1743 05/14/21 2126 05/15/21 0814  GLUCAP 332* 354* 308* 248* 275*     Recent Results (from the past 240 hour(s))  Resp Panel by RT-PCR (Flu A&B, Covid) Nasopharyngeal Swab     Status: None   Collection Time: 05/14/21  3:39 AM   Specimen: Nasopharyngeal Swab; Nasopharyngeal(NP) swabs in vial transport medium  Result Value Ref Range Status   SARS Coronavirus 2 by RT PCR NEGATIVE NEGATIVE Final    Comment: (NOTE) SARS-CoV-2 target nucleic acids are NOT DETECTED.  The SARS-CoV-2 RNA is generally detectable in upper respiratory specimens during the acute phase of infection. The lowest concentration of SARS-CoV-2 viral copies this assay can detect is 138 copies/mL. A negative result does not preclude SARS-Cov-2 infection and should not be used as the sole basis for treatment or other patient management decisions. A  negative result may occur with  improper specimen collection/handling, submission of specimen other than nasopharyngeal swab, presence of viral mutation(s) within the areas targeted by this assay, and inadequate number of viral copies(<138 copies/mL). A negative result must be combined with clinical observations, patient history, and epidemiological information. The expected result is Negative.  Fact Sheet for Patients:  BloggerCourse.com  Fact Sheet for Healthcare Providers:  SeriousBroker.it  This test is no t yet approved or cleared by the [place] FDA and  has been authorized for detection and/or diagnosis of SARS-CoV-2 by FDA under an Emergency Use Authorization (EUA). This EUA will remain  in effect (meaning this test can be used) for the duration  of the COVID-19 declaration under Section 564(b)(1) of the Act, 21 U.S.C.section 360bbb-3(b)(1), unless the authorization is terminated  or revoked sooner.       Influenza A by PCR NEGATIVE NEGATIVE Final   Influenza B by PCR NEGATIVE NEGATIVE Final    Comment: (NOTE) The Xpert Xpress SARS-CoV-2/FLU/RSV plus assay is intended as an aid in the diagnosis of influenza from Nasopharyngeal swab specimens and should not be used as a sole basis for treatment. Nasal washings and aspirates are unacceptable for Xpert Xpress SARS-CoV-2/FLU/RSV testing.  Fact Sheet for Patients: BloggerCourse.com  Fact Sheet for Healthcare Providers: SeriousBroker.it  This test is not yet approved or cleared by the [place] FDA and has been authorized for detection and/or diagnosis of SARS-CoV-2 by FDA under an Emergency Use Authorization (EUA). This EUA will remain in effect (meaning this test can be used) for the duration of the COVID-19 declaration under Section 564(b)(1) of the Act, 21 U.S.C. section 360bbb-3(b)(1), unless the authorization  is terminated or revoked.  Performed at [facility], [address] [place], [place] [address]          Radiology Studies: DG Chest Portable 1 View  Result Date: 05/14/2021 CLINICAL DATA:  Chest pain EXAM: PORTABLE CHEST 1 VIEW COMPARISON:  05/12/2021 FINDINGS: Cardiac shadow is stable. Aortic calcifications are noted. Lungs are well aerated bilaterally. Calcified granuloma in the left base is seen. No bony abnormality is noted. IMPRESSION: No acute abnormality noted. Electronically Signed   By: Alcide Clever M.D.   On: 05/14/2021 01:25        Scheduled Meds:  aspirin EC  81 mg Oral Daily   atorvastatin  80 mg Oral Daily   azithromycin  250 mg Oral Daily   enoxaparin (LOVENOX) injection  40 mg Subcutaneous Q24H   insulin aspart  0-5 Units Subcutaneous QHS   insulin aspart  0-9 Units Subcutaneous TID WC   ipratropium-albuterol  3 mL Nebulization BID   lisinopril  20 mg Oral Daily   methylPREDNISolone (SOLU-MEDROL) injection  40 mg Intravenous Q12H   metoprolol tartrate  25 mg Oral BID   mometasone-formoterol  2 puff Inhalation BID   Continuous Infusions:   LOS: 1 day        Kathlen Mody, MD [facility]   To contact the attending provider between 7A-7P or the covering provider during after hours 7P-7A, please log into the web site [email or web address] and access using universal [place] password for that web site. If you do not have the password, please call the hospital operator.  05/15/2021, 10:56 AM

## 2021-05-15 NOTE — Plan of Care (Signed)
  Problem: Education: Goal: Knowledge of General Education information will improve Description: Including pain rating scale, medication(s)/side effects and non-pharmacologic comfort measures 05/15/2021 0816 by Jean Rosenthal, RN Outcome: Progressing 05/15/2021 0738 by Jean Rosenthal, RN Outcome: Progressing   Problem: Pain Managment: Goal: General experience of comfort will improve 05/15/2021 0816 by Jean Rosenthal, RN Outcome: Progressing 05/15/2021 0738 by Jean Rosenthal, RN Outcome: Progressing   Problem: Safety: Goal: Ability to remain free from injury will improve 05/15/2021 0816 by Jean Rosenthal, RN Outcome: Progressing 05/15/2021 0738 by Jean Rosenthal, RN Outcome: Progressing   Problem: Skin Integrity: Goal: Risk for impaired skin integrity will decrease 05/15/2021 0816 by Jean Rosenthal, RN Outcome: Progressing 05/15/2021 0738 by Jean Rosenthal, RN Outcome: Progressing

## 2021-05-15 NOTE — Plan of Care (Signed)

## 2021-05-16 ENCOUNTER — Encounter: Payer: Self-pay | Admitting: Internal Medicine

## 2021-05-16 LAB — GLUCOSE, CAPILLARY
Glucose-Capillary: 224 mg/dL — ABNORMAL HIGH (ref 70–99)
Glucose-Capillary: 133 mg/dL — ABNORMAL HIGH (ref 70–99)
Glucose-Capillary: 136 mg/dL — ABNORMAL HIGH (ref 70–99)
Glucose-Capillary: 123 mg/dL — ABNORMAL HIGH (ref 70–99)

## 2021-05-16 LAB — CBC
HCT: 40.9 % (ref 36.0–46.0)
Hemoglobin: 13.4 g/dL (ref 12.0–15.0)
MCH: 29.3 pg (ref 26.0–34.0)
MCHC: 32.8 g/dL (ref 30.0–36.0)
MCV: 89.5 fL (ref 80.0–100.0)
Platelets: 334 10*3/uL (ref 150–400)
RBC: 4.57 MIL/uL (ref 3.87–5.11)
RDW: 13.5 % (ref 11.5–15.5)
WBC: 29.2 10*3/uL — ABNORMAL HIGH (ref 4.0–10.5)
nRBC: 0 % (ref 0.0–0.2)

## 2021-05-16 LAB — LACTIC ACID, PLASMA: Lactic Acid, Venous: 3.3 mmol/L (ref 0.5–1.9)

## 2021-05-16 MED ORDER — HALOPERIDOL LACTATE 5 MG/ML IJ SOLN: 1.0000 mg | Freq: Every evening | INTRAMUSCULAR | Status: DC | PRN

## 2021-05-16 MED ORDER — HALOPERIDOL LACTATE 5 MG/ML IJ SOLN
2.0000 mg | Freq: Once | INTRAMUSCULAR | Status: AC
  Administered 2021-05-16: 2 mg via INTRAVENOUS
  Filled 2021-05-16: qty 1

## 2021-05-16 MED ORDER — PREDNISONE 20 MG PO TABS
40.0000 mg | ORAL_TABLET | Freq: Every day | ORAL | Status: DC
  Administered 2021-05-17: 40 mg via ORAL
  Filled 2021-05-16: qty 2

## 2021-05-16 MED ORDER — HALOPERIDOL LACTATE 5 MG/ML IJ SOLN
1.0000 mg | Freq: Four times a day (QID) | INTRAMUSCULAR | Status: DC | PRN
  Administered 2021-05-17: 1 mg via INTRAVENOUS
  Filled 2021-05-16: qty 1

## 2021-05-16 MED ORDER — LACTATED RINGERS IV SOLN: INTRAVENOUS | Status: DC

## 2021-05-16 NOTE — Progress Notes (Signed)
Physical Therapy Treatment Patient Details Name: Tanya Cole MRN: [id number] DOB: Apr 25, 1933 Today's Date: 05/16/2021    History of Present Illness presented to ER secondary to SOB, cough; admitted for management of acute/chronic respiratory failure with hypoxia, severe sepsis due to COPD/asthma exacerbation.    PT Comments    Pt ready for session.  OOB without difficulty.  Stood and is able to walk 41' initially with no AD but then she opts to use IV pole for support.  She is initially resistant to use of RW stating she does not want to be dependant on it.  After session continued discussion with pt and daughter and she is open to RW at home.  Discussed benefits of RW to allow for increased gait distances, safety and confidence.   Follow Up Recommendations  Home health PT     Equipment Recommendations  Rolling walker with 5" wheels;3in1 (PT)    Recommendations for Other Services       Precautions / Restrictions Precautions Precautions: Fall Restrictions Weight Bearing Restrictions: No    Mobility  Bed Mobility Overal bed mobility: Needs Assistance Bed Mobility: Supine to Sit     Supine to sit: Modified independent (Device/Increase time)          Transfers Overall transfer level: Needs assistance Equipment used: None Transfers: Sit to/from Stand Sit to Stand: Supervision            Ambulation/Gait Ambulation/Gait assistance: Min guard Gait Distance (Feet): 75 Feet Assistive device: None;IV Pole Gait Pattern/deviations: Step-through pattern;Decreased step length - right;Decreased step length - left Gait velocity: decreased   General Gait Details: slow gait with c/o LE weakness limiting distance.   Stairs             Wheelchair Mobility    Modified Rankin (Stroke Patients Only)       Balance Overall balance assessment: Needs assistance Sitting-balance support: Feet supported;No upper extremity supported Sitting balance-Leahy Scale:  Good     Standing balance support: Bilateral upper extremity supported Standing balance-Leahy Scale: Fair                              Cognition Arousal/Alertness: Awake/alert Behavior During Therapy: WFL for tasks assessed/performed Overall Cognitive Status: Within Functional Limits for tasks assessed                                        Exercises      General Comments        Pertinent Vitals/Pain Pain Assessment: No/denies pain    Home Living                      Prior Function            PT Goals (current goals can now be found in the care plan section) Progress towards PT goals: Progressing toward goals    Frequency    Min 2X/week      PT Plan Current plan remains appropriate    Co-evaluation              AM-PAC PT "6 Clicks" Mobility   Outcome Measure  Help needed turning from your back to your side while in a flat bed without using bedrails?: None Help needed moving from lying on your back to sitting on the side of a flat bed without  using bedrails?: A Little Help needed moving to and from a bed to a chair (including a wheelchair)?: A Little Help needed standing up from a chair using your arms (e.g., wheelchair or bedside chair)?: A Little Help needed to walk in hospital room?: A Little Help needed climbing 3-5 steps with a railing? : A Little 6 Click Score: 19    End of Session Equipment Utilized During Treatment: Gait belt Activity Tolerance: Patient tolerated treatment well Patient left: in chair;with call bell/phone within reach;with chair alarm set;with family/visitor present Nurse Communication: Mobility status PT Visit Diagnosis: Muscle weakness (generalized) (M62.81);Difficulty in walking, not elsewhere classified (R26.2)     Time: 5732-2025 PT Time Calculation (min) (ACUTE ONLY): 12 min  Charges:  $Gait Training: 8-22 mins                    Danielle Dess, PTA 05/16/21, 12:15 PM , 12:04  PM
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
## 2021-05-16 NOTE — Progress Notes (Signed)
PROGRESS NOTE    Claypool Loretto  ZOX:[id number] DOB: 08-18-1933 DOA: 05/14/2021 PCP: Dale Lillington, MD   No chief complaint on file.   Brief Narrative:  87 year old female prior history of hypertension hyperlipidemia diabetes, asthma, COPD on room air at home, coronary artery disease, diastolic heart failure presents with shortness of breath since 1 day.  She was admitted for acute COPD exacerbation and initially required 2 L of nasal cannula oxygen to keep sats greater than 90%. Pt seen and examined at bedside.   Assessment & Plan:   Principal Problem:   COPD exacerbation (HCC) Active Problems:   Hypertension   Hypercholesterolemia   Diabetes mellitus with cardiac complication (HCC)   Chronic diastolic CHF (congestive heart failure) (HCC)   CAD (coronary artery disease)   Severe sepsis (HCC)   Hypokalemia   Acute on chronic respiratory failure with hypoxia (HCC)   Asthma exacerbation   Hypomagnesemia  Acute respiratory failure with hypoxia secondary to COPD/asthma exacerbation Initially requiring up to 2 L of nasal cannula oxygen to keep sats greater than 90%. Currently she is weaned off oxygen, on RA, wheezing has resolved, transition to prednisone tomorrow.  Sepsis has been ruled out.   Elevated WBC count probably secondary to steroids .   Elevated lactic acid:  Started her IV fluids, probably related to dehydration.   Chronic diastolic heart failure Last echocardiogram from October 2021 showed preserved left ventricular ejection fraction with grade 2 diastolic dysfunction.  Chest x-ray does not show any pulmonary edema and she does not have pedal edema.  BNP is slightly elevated.  She appears to be compensated at this time and will need for repeat echocardiogram. She denies any chest pain or at this time   History of coronary artery disease No chest pain this morning  continue with aspirin Lipitor and metoprolol.   Hypokalemia and  hypomagnesemia Replaced.   Essential hypertension Well controlled BP parameters.    Hyperlipidemia Continue with the Lipitor.  Type 2 diabetes mellitus, non-insulin-dependent, poorly controlled with elevated CBGs, hyperglycemia next CBG (last 3)  Recent Labs    05/15/21 1633 05/15/21 2102 05/16/21 0814  GLUCAP 161* 184* 224*    Elevated CBGs probably secondary to steroids we will add NovoLog 3 units 3 times daily before meals and continue with sliding scale insulin.    Hospital acquired Delirium with hallucinations.  Re orient frequently.  Prn haldol.  Family at bedside.      DVT prophylaxis: Lovenox Code Status: Dnr Family Communication: daughter at bedside.  Disposition:   Status is: Inpatient  Remains inpatient appropriate because:Unsafe d/c plan and IV treatments appropriate due to intensity of illness or inability to take PO  Dispo: The patient is from: Home              Anticipated d/c is to: Home              Patient currently is not medically stable to d/c.   Difficult to place patient No       Consultants:  None.   Procedures: none.   Antimicrobials:  Antibiotics Given (last 72 hours)     Date/Time Action Medication Dose   05/14/21 1041 Given   azithromycin (ZITHROMAX) tablet 500 mg 500 mg   05/15/21 0830 Given   azithromycin (ZITHROMAX) tablet 250 mg 250 mg   05/16/21 0859 Given   azithromycin (ZITHROMAX) tablet 250 mg 250 mg         Subjective: Confused, and delirious.   Objective:  Vitals:   05/15/21 1526 05/15/21 1933 05/16/21 0500 05/16/21 0813  BP: (!) 141/54 (!) 162/68  (!) 125/56  Pulse: 71 79  (!) 104  Resp:  18  18  Temp: 97.8 F (36.6 C) 98.1 F (36.7 C)  97.7 F (36.5 C)  TempSrc:    Oral  SpO2: 98% 98%  95%  Weight:   59.6 kg     Intake/Output Summary (Last 24 hours) at 05/16/2021 1059 Last data filed at 05/16/2021 1006 Gross per 24 hour  Intake 360 ml  Output --  Net 360 ml    Filed Weights    05/15/21 0500 05/16/21 0500  Weight: 59.2 kg 59.6 kg    Examination:  General exam: elderly woman, not in distress.  Respiratory system:  no wheezing heard on exam today. On RA, no tachypnea.  Cardiovascular system: S1 & S2 heard,RRR , no JVD, no pedal edema.  Gastrointestinal system: Abdomen is soft , NT ND BS+ Central nervous system: alert but confused, delirious. Able to move all extremities.  Extremities: no pedal edema.  Skin: No rashes seen.  Psychiatry: Mood is appropriate.     Data Reviewed: I have personally reviewed following labs and imaging studies  CBC: Recent Labs  Lab 05/12/21 0715 05/14/21 0042 05/15/21 0415 05/16/21 0409  WBC 9.1 11.2* 19.0* 29.2*  HGB 13.3 12.5 11.1* 13.4  HCT 39.2 36.5 33.1* 40.9  MCV 86.0 88.2 88.0 89.5  PLT 225 247 224 334     Basic Metabolic Panel: Recent Labs  Lab 05/12/21 0715 05/14/21 0042 05/14/21 0737 05/15/21 0415  NA 142 139  --  139  K 3.5 3.4*  --  4.0  CL 107 105  --  109  CO2 26 25  --  23  GLUCOSE 193* 227*  --  272*  BUN 13 12  --  22  CREATININE 0.61 0.65  --  0.72  CALCIUM 9.4 9.3  --  8.7*  MG  --   --  1.5* 1.9     GFR: Estimated Creatinine Clearance: 42.6 mL/min (by C-G formula based on SCr of 0.72 mg/dL).  Liver Function Tests: Recent Labs  Lab 05/12/21 0715  AST 16  ALT 15  ALKPHOS 70  BILITOT 1.2  PROT 6.9  ALBUMIN 3.9     CBG: Recent Labs  Lab 05/15/21 0814 05/15/21 1207 05/15/21 1633 05/15/21 2102 05/16/21 0814  GLUCAP 275* 235* 161* 184* 224*      Recent Results (from the past 240 hour(s))  Resp Panel by RT-PCR (Flu A&B, Covid) Nasopharyngeal Swab     Status: None   Collection Time: 05/14/21  3:39 AM   Specimen: Nasopharyngeal Swab; Nasopharyngeal(NP) swabs in vial transport medium  Result Value Ref Range Status   SARS Coronavirus 2 by RT PCR NEGATIVE NEGATIVE Final    Comment: (NOTE) SARS-CoV-2 target nucleic acids are NOT DETECTED.  The SARS-CoV-2 RNA is  generally detectable in upper respiratory specimens during the acute phase of infection. The lowest concentration of SARS-CoV-2 viral copies this assay can detect is 138 copies/mL. A negative result does not preclude SARS-Cov-2 infection and should not be used as the sole basis for treatment or other patient management decisions. A negative result may occur with  improper specimen collection/handling, submission of specimen other than nasopharyngeal swab, presence of viral mutation(s) within the areas targeted by this assay, and inadequate number of viral copies(<138 copies/mL). A negative result must be combined with clinical observations, patient history, and epidemiological information. The  expected result is Negative.  Fact Sheet for Patients:  BloggerCourse.com  Fact Sheet for Healthcare Providers:  SeriousBroker.it  This test is no t yet approved or cleared by the [place] FDA and  has been authorized for detection and/or diagnosis of SARS-CoV-2 by FDA under an Emergency Use Authorization (EUA). This EUA will remain  in effect (meaning this test can be used) for the duration of the COVID-19 declaration under Section 564(b)(1) of the Act, 21 U.S.C.section 360bbb-3(b)(1), unless the authorization is terminated  or revoked sooner.       Influenza A by PCR NEGATIVE NEGATIVE Final   Influenza B by PCR NEGATIVE NEGATIVE Final    Comment: (NOTE) The Xpert Xpress SARS-CoV-2/FLU/RSV plus assay is intended as an aid in the diagnosis of influenza from Nasopharyngeal swab specimens and should not be used as a sole basis for treatment. Nasal washings and aspirates are unacceptable for Xpert Xpress SARS-CoV-2/FLU/RSV testing.  Fact Sheet for Patients: BloggerCourse.com  Fact Sheet for Healthcare Providers: SeriousBroker.it  This test is not yet approved or cleared by the [place] FDA and has been authorized for detection and/or diagnosis of SARS-CoV-2 by FDA under an Emergency Use Authorization (EUA). This EUA will remain in effect (meaning this test can be used) for the duration of the COVID-19 declaration under Section 564(b)(1) of the Act, 21 U.S.C. section 360bbb-3(b)(1), unless the authorization is terminated or revoked.  Performed at [facility], [address], [place], [place] [address]           Radiology Studies: No results found.      Scheduled Meds:  aspirin EC  81 mg Oral Daily   atorvastatin  80 mg Oral Daily   azithromycin  250 mg Oral Daily   enoxaparin (LOVENOX) injection  40 mg Subcutaneous Q24H   insulin aspart  0-15 Units Subcutaneous TID WC   insulin aspart  0-5 Units Subcutaneous QHS   insulin aspart  3 Units Subcutaneous TID WC   ipratropium-albuterol  3 mL Nebulization BID   lisinopril  20 mg Oral Daily   metoprolol tartrate  25 mg Oral BID   mometasone-formoterol  2 puff Inhalation BID   [START ON 05/17/2021] predniSONE  40 mg Oral QAC breakfast   Continuous Infusions:  lactated ringers 75 mL/hr at 05/16/21 0906     LOS: 2 days        Kathlen Mody, MD [facility]   To contact the attending provider between 7A-7P or the covering provider during after hours 7P-7A, please log into the web site [email or web address] and access using universal [place] password for that web site. If you do not have the password, please call the hospital operator.  05/16/2021, 10:59 AM

## 2021-05-17 LAB — CBC
HCT: 40.9 % (ref 36.0–46.0)
Hemoglobin: 13.6 g/dL (ref 12.0–15.0)
MCH: 29.2 pg (ref 26.0–34.0)
MCHC: 33.3 g/dL (ref 30.0–36.0)
MCV: 87.8 fL (ref 80.0–100.0)
Platelets: 316 10*3/uL (ref 150–400)
RBC: 4.66 MIL/uL (ref 3.87–5.11)
RDW: 13.7 % (ref 11.5–15.5)
WBC: 14.5 10*3/uL — ABNORMAL HIGH (ref 4.0–10.5)
nRBC: 0 % (ref 0.0–0.2)

## 2021-05-17 LAB — BASIC METABOLIC PANEL
Anion gap: 10 (ref 5–15)
BUN: 18 mg/dL (ref 8–23)
CO2: 25 mmol/L (ref 22–32)
Calcium: 8.7 mg/dL — ABNORMAL LOW (ref 8.9–10.3)
Chloride: 104 mmol/L (ref 98–111)
Creatinine, Ser: 0.83 mg/dL (ref 0.44–1.00)
GFR, Estimated: >60 mL/min (ref >60)
Glucose, Bld: 203 mg/dL — ABNORMAL HIGH (ref 70–99)
Potassium: 4.1 mmol/L (ref 3.5–5.1)
Sodium: 139 mmol/L (ref 135–145)

## 2021-05-17 LAB — GLUCOSE, CAPILLARY
Glucose-Capillary: 193 mg/dL — ABNORMAL HIGH (ref 70–99)
Glucose-Capillary: 191 mg/dL — ABNORMAL HIGH (ref 70–99)
Glucose-Capillary: 204 mg/dL — ABNORMAL HIGH (ref 70–99)

## 2021-05-17 LAB — LACTIC ACID, PLASMA: Lactic Acid, Venous: 1.7 mmol/L (ref 0.5–1.9)

## 2021-05-17 MED ORDER — PREDNISONE 20 MG PO TABS: 40.0000 mg | ORAL_TABLET | Freq: Every day | ORAL | 0 refills | Status: AC

## 2021-05-17 MED ORDER — IPRATROPIUM-ALBUTEROL 0.5-2.5 (3) MG/3ML IN SOLN: 3.0000 mL | Freq: Two times a day (BID) | RESPIRATORY_TRACT | 7 refills | Status: DC

## 2021-05-17 MED ORDER — AZITHROMYCIN 250 MG PO TABS: 250.0000 mg | ORAL_TABLET | Freq: Every day | ORAL | 0 refills | Status: AC

## 2021-05-17 MED ORDER — DM-GUAIFENESIN ER 30-600 MG PO TB12: 1.0000 | ORAL_TABLET | Freq: Two times a day (BID) | ORAL | 0 refills | Status: DC | PRN

## 2021-05-17 NOTE — TOC Progression Note (Addendum)
Transition of Care [facility]) - Progression Note    Patient Details  Name: Tanya Cole MRN: [id number] Date of Birth: 11/14/1932  Transition of Care [facility]) CM/SW Contact  Barrie Dunker, RN Phone Number: 05/17/2021, 10:13 AM  Clinical Narrative:     Spoke with the patient and she is agreeable to [facility] PT and Nurse She will likely need Oxygen at home and understands she needs to use it, she has a rolling walker at home, she agrees to [facility] for [facility], I notified Stacie at [facility], awaiting a responce       Expected Discharge Plan and Services                                                 Social Determinants of Health (SDOH) Interventions    Readmission Risk Interventions Readmission Risk Prevention Plan 05/14/2021  Transportation Screening Complete  PCP or Specialist Appt within 5-7 Days Complete  Home Care Screening Complete  Medication Review (RN CM) Referral to Pharmacy  Some recent data might be hidden

## 2021-05-17 NOTE — Care Management Important Message (Signed)
Important Message  Patient Details  Name: Tanya Cole MRN: [id number] Date of Birth: 01-Aug-1933   Medicare Important Message Given:  Yes     Olegario Messier A Josephine Rudnick 05/17/2021, 12:51 PM

## 2021-05-17 NOTE — TOC Progression Note (Signed)
Transition of Care [facility]) - CM/SW Discharge Note   Patient Details  Name: Tanya Cole MRN: [id number] Date of Birth: [birth year]  Transition of Care [facility]) CM/SW Contact:  Barrie Dunker, RN Phone Number: 05/17/2021, 1:07 PM   Clinical Narrative:     [facility] has accepted [facility], she will not need Home Oxygen, she has a rolling walker art home        Patient Goals and CMS Choice        Discharge Placement                       Discharge Plan and Services                                     Social Determinants of Health (SDOH) Interventions     Readmission Risk Interventions Readmission Risk Prevention Plan 05/14/2021  Transportation Screening Complete  PCP or Specialist Appt within 5-7 Days Complete  Home Care Screening Complete  Medication Review (RN CM) Referral to Pharmacy  Some recent data might be hidden

## 2021-05-17 NOTE — Progress Notes (Signed)
Physical Therapy Treatment Patient Details Name: Tanya Cole MRN: [id number] DOB: [birth year] Today's Date: 05/17/2021    History of Present Illness presented to ER secondary to SOB, cough; admitted for management of acute/chronic respiratory failure with hypoxia, severe sepsis due to COPD/asthma exacerbation.    PT Comments    Pt ready for session,  In chair.  Agrees to use RW and is able to walk 24' with RW and slow but steady gait.  Reports feeling more comfortable with walker but remains limited by LE weakness.  After seated rest in chair HR and O2 checked and WFL.  She stands and reports feeling dizzy.  She sits and HR noted to be 150's.  RN in and stated she had not had morning meds yet and was in to give.  Pt with no chest pain/discomfort and remained in recliner after session.   Follow Up Recommendations  Home health PT     Equipment Recommendations  Rolling walker with 5" wheels;3in1 (PT)    Recommendations for Other Services       Precautions / Restrictions Precautions Precautions: Fall Restrictions Weight Bearing Restrictions: No    Mobility  Bed Mobility               General bed mobility comments: in recliner before and after    Transfers     Transfers: Sit to/from Stand Sit to Stand: Supervision            Ambulation/Gait Ambulation/Gait assistance: Min guard Gait Distance (Feet): 75 Feet Assistive device: Rolling walker (2 wheeled) Gait Pattern/deviations: Step-through pattern;Decreased step length - right;Decreased step length - left Gait velocity: decreased   General Gait Details: slow gait with c/o LE weakness limiting distance.   Stairs             Wheelchair Mobility    Modified Rankin (Stroke Patients Only)       Balance Overall balance assessment: Needs assistance Sitting-balance support: Feet supported;No upper extremity supported Sitting balance-Leahy Scale: Good     Standing balance support: Bilateral  upper extremity supported Standing balance-Leahy Scale: Fair                              Cognition Arousal/Alertness: Awake/alert Behavior During Therapy: WFL for tasks assessed/performed Overall Cognitive Status: Within Functional Limits for tasks assessed                                        Exercises      General Comments        Pertinent Vitals/Pain Pain Assessment: No/denies pain    Home Living                      Prior Function            PT Goals (current goals can now be found in the care plan section) Progress towards PT goals: Progressing toward goals    Frequency    Min 2X/week      PT Plan Current plan remains appropriate    Co-evaluation              AM-PAC PT "6 Clicks" Mobility   Outcome Measure  Help needed turning from your back to your side while in a flat bed without using bedrails?: None Help needed moving from lying on your back to  sitting on the side of a flat bed without using bedrails?: A Little Help needed moving to and from a bed to a chair (including a wheelchair)?: A Little Help needed standing up from a chair using your arms (e.g., wheelchair or bedside chair)?: A Little Help needed to walk in hospital room?: A Little Help needed climbing 3-5 steps with a railing? : A Little 6 Click Score: 19    End of Session Equipment Utilized During Treatment: Gait belt Activity Tolerance: Patient tolerated treatment well Patient left: in chair;with call bell/phone within reach;with chair alarm set Nurse Communication: Mobility status PT Visit Diagnosis: Muscle weakness (generalized) (M62.81);Difficulty in walking, not elsewhere classified (R26.2)     Time: 3094-0768 PT Time Calculation (min) (ACUTE ONLY): 26 min  Charges:  $Gait Training: 23-37 mins                    Danielle Dess, PTA 05/17/21, 10:28 AM , 10:24 AM

## 2021-05-18 ENCOUNTER — Telehealth: Payer: Self-pay

## 2021-05-18 ENCOUNTER — Other Ambulatory Visit: Payer: Self-pay

## 2021-05-18 DIAGNOSIS — J441 Chronic obstructive pulmonary disease with (acute) exacerbation: Secondary | ICD-10-CM

## 2021-05-18 NOTE — Discharge Summary (Signed)
Physician Discharge Summary  [place] ZOX:[id number] DOB: 01-04-1933 DOA: 05/14/2021  PCP: Dale , MD  Admit date: 05/14/2021 Discharge date: 05/17/2021  Admitted From: Home.  Disposition:  Home  Recommendations for Outpatient Follow-up:  Follow up with PCP in 1-2 weeks Please obtain BMP/CBC in one week Recommend follow up with pulmonology for recurrent symptoms.   Home Health:yes  Discharge Condition:guarded.  CODE STATUS:full code.  Diet recommendation: Heart Healthy    Brief/Interim Summary: 87 year old female prior history of hypertension hyperlipidemia diabetes, asthma, COPD on room air at home, coronary artery disease, diastolic heart failure presents with shortness of breath since 1 day.  She was admitted for acute COPD exacerbation and initially required 2 L of nasal cannula oxygen to keep sats greater than 90%.  Discharge Diagnoses:  Principal Problem:   COPD exacerbation (HCC) Active Problems:   Hypertension   Hypercholesterolemia   Diabetes mellitus with cardiac complication (HCC)   Chronic diastolic CHF (congestive heart failure) (HCC)   CAD (coronary artery disease)   Severe sepsis (HCC)   Hypokalemia   Acute on chronic respiratory failure with hypoxia (HCC)   Asthma exacerbation   Hypomagnesemia    Acute respiratory failure with hypoxia secondary to COPD/asthma exacerbation Initially requiring up to 2 L of nasal cannula oxygen to keep sats greater than 90%. Currently she is weaned off oxygen, on RA, wheezing has resolved, transition to prednisone on discharge.  Sepsis has been ruled out.   Elevated WBC count probably secondary to steroids .     Elevated lactic acid: Started her IV fluids, probably related to dehydration. resolved.    Chronic diastolic heart failure Last echocardiogram from October 2021 showed preserved left ventricular ejection fraction with grade 2 diastolic dysfunction.  Chest x-ray does not show any pulmonary edema  and she does not have pedal edema.  BNP is slightly elevated.  She appears to be compensated at this time and no need for repeat echocardiogram. She denies any chest pain or at this time     History of coronary artery disease No chest pain this morning continue with aspirin Lipitor and metoprolol.     Hypokalemia and hypomagnesemia Replaced.     Essential hypertension Well controlled BP parameters.      Hyperlipidemia Continue with the Lipitor.   Type 2 diabetes mellitus, non-insulin-dependent, poorly controlled with hyperglycemia  Elevated CBGs probably secondary to steroids        Hospital acquired Delirium with hallucinations. Resolved.    Discharge Instructions  Discharge Instructions     Diet - low sodium heart healthy   Complete by: As directed    Discharge instructions   Complete by: As directed    Please follow up with PCP by the end of the week.      Allergies as of 05/17/2021       Reactions   Tramadol Itching, Nausea And Vomiting        Medication List     STOP taking these medications    ibuprofen 600 MG tablet Commonly known as: ADVIL       TAKE these medications    Advair Diskus 250-50 MCG/ACT Aepb Generic drug: fluticasone-salmeterol INHALE ONE PUFF BY MOUTH EVERY 12 HOURS. RINSE MOUTH AFTER EACH USE   alendronate 70 MG tablet Commonly known as: FOSAMAX Take 70 mg by mouth once a week.   aspirin 81 MG tablet Take 81 mg by mouth daily.   atorvastatin 80 MG tablet Commonly known as: LIPITOR TAKE 1 TABLET BY  MOUTH ONCE DAILY AT  6PM   azithromycin 250 MG tablet Commonly known as: ZITHROMAX Take 1 tablet (250 mg total) by mouth daily for 2 days.   dextromethorphan-guaiFENesin 30-600 MG 12hr tablet Commonly known as: MUCINEX DM Take 1 tablet by mouth 2 (two) times daily as needed for cough.   fluticasone 50 MCG/ACT nasal spray Commonly known as: FLONASE Use 2 spray(s) in each nostril once daily   ipratropium-albuterol  0.5-2.5 (3) MG/3ML Soln Commonly known as: DUONEB Take 3 mLs by nebulization 2 (two) times daily.   lisinopril 20 MG tablet Commonly known as: ZESTRIL Take 1 tablet by mouth once daily   metFORMIN 500 MG tablet Commonly known as: GLUCOPHAGE TAKE 1 TABLET BY MOUTH TWICE DAILY WITH A MEAL   metoprolol tartrate 25 MG tablet Commonly known as: LOPRESSOR Take 1 tablet by mouth twice daily   nitroGLYCERIN 0.4 MG SL tablet Commonly known as: NITROSTAT Place 1 tablet (0.4 mg total) under the tongue every 5 (five) minutes as needed for chest pain.   predniSONE 20 MG tablet Commonly known as: DELTASONE Take 2 tablets (40 mg total) by mouth daily before breakfast for 5 days.   ProAir HFA 108 (90 Base) MCG/ACT inhaler Generic drug: albuterol Inhale 2 puffs into the lungs every 4 (four) hours as needed for wheezing or shortness of breath. What changed: Another medication with the same name was removed. Continue taking this medication, and follow the directions you see here.   Vitamin D (Ergocalciferol) 1.25 MG (50000 UNIT) Caps capsule Commonly known as: DRISDOL Take 50,000 Units by mouth once a week.        Follow-up Information     Dale [place], MD. Go on 05/20/2021.   Specialty: Internal Medicine Why: Appt @ 2:30 pm Contact information: [address] [place] [address] [phone]         Iran Ouch, MD .   Specialty: Cardiology Contact information: [address] [place] [address] [phone]                Allergies  Allergen Reactions   Tramadol Itching and Nausea And Vomiting    Consultations: None.    Procedures/Studies: DG Chest 2 View  Result Date: 04/21/2021 CLINICAL DATA:  Shortness of breath EXAM: CHEST - 2 VIEW COMPARISON:  03/01/2021 FINDINGS: The heart size and mediastinal contours are within normal limits. Both lungs are clear. No pleural effusion or pneumothorax. The visualized  skeletal structures are unremarkable. IMPRESSION: No acute process in the chest. Electronically Signed   By: Guadlupe Spanish M.D.   On: 04/21/2021 09:11   DG Chest Portable 1 View  Result Date: 05/14/2021 CLINICAL DATA:  Chest pain EXAM: PORTABLE CHEST 1 VIEW COMPARISON:  05/12/2021 FINDINGS: Cardiac shadow is stable. Aortic calcifications are noted. Lungs are well aerated bilaterally. Calcified granuloma in the left base is seen. No bony abnormality is noted. IMPRESSION: No acute abnormality noted. Electronically Signed   By: Alcide Clever M.D.   On: 05/14/2021 01:25   DG Chest Portable 1 View  Result Date: 05/12/2021 CLINICAL DATA:  87 year old female with chest pain and pressure. Shortness of breath onset this morning. EXAM: PORTABLE CHEST 1 VIEW COMPARISON:  Chest radiograph 04/21/2021 and earlier. FINDINGS: Portable AP semi upright view at 0729 hours. Lung volumes and mediastinal contours remain within normal limits. Visualized tracheal air column is within normal limits. Mild chronic pulmonary interstitial markings in both lungs appear chronic and stable. Small chronic calcified  granuloma at the left lateral lung base. Otherwise Allowing for portable technique the lungs are clear. No pneumothorax or pleural effusion. No acute osseous abnormality identified. IMPRESSION: No acute cardiopulmonary abnormality. Electronically Signed   By: Odessa Fleming M.D.   On: 05/12/2021 07:56      Subjective: No new complaints.   Discharge Exam: Vitals:   05/17/21 0726 05/17/21 1518  BP: (!) 121/97 113/80  Pulse: 83 (!) 110  Resp: 15 14  Temp: 98.5 F (36.9 C) 98 F (36.7 C)  SpO2: 99% 99%   Vitals:   05/17/21 0440 05/17/21 0448 05/17/21 0726 05/17/21 1518  BP: 121/85  (!) 121/97 113/80  Pulse: 69  83 (!) 110  Resp: 18  15 14   Temp: 98.1 F (36.7 C)  98.5 F (36.9 C) 98 F (36.7 C)  TempSrc: Oral  Oral   SpO2: 97%  99% 99%  Weight:  55.4 kg    Height:        General: Pt is alert, awake, not in  acute distress Cardiovascular: RRR, S1/S2 +, no rubs, no gallops Respiratory: CTA bilaterally, no wheezing, no rhonchi Abdominal: Soft, NT, ND, bowel sounds + Extremities: no edema, no cyanosis    The results of significant diagnostics from this hospitalization (including imaging, microbiology, ancillary and laboratory) are listed below for reference.     Microbiology: Recent Results (from the past 240 hour(s))  Resp Panel by RT-PCR (Flu A&B, Covid) Nasopharyngeal Swab     Status: None   Collection Time: 05/14/21  3:39 AM   Specimen: Nasopharyngeal Swab; Nasopharyngeal(NP) swabs in vial transport medium  Result Value Ref Range Status   SARS Coronavirus 2 by RT PCR NEGATIVE NEGATIVE Final    Comment: (NOTE) SARS-CoV-2 target nucleic acids are NOT DETECTED.  The SARS-CoV-2 RNA is generally detectable in upper respiratory specimens during the acute phase of infection. The lowest concentration of SARS-CoV-2 viral copies this assay can detect is 138 copies/mL. A negative result does not preclude SARS-Cov-2 infection and should not be used as the sole basis for treatment or other patient management decisions. A negative result may occur with  improper specimen collection/handling, submission of specimen other than nasopharyngeal swab, presence of viral mutation(s) within the areas targeted by this assay, and inadequate number of viral copies(<138 copies/mL). A negative result must be combined with clinical observations, patient history, and epidemiological information. The expected result is Negative.  Fact Sheet for Patients:  05/16/21  Fact Sheet for Healthcare Providers:  BloggerCourse.com  This test is no t yet approved or cleared by the [place] FDA and  has been authorized for detection and/or diagnosis of SARS-CoV-2 by FDA under an Emergency Use Authorization (EUA). This EUA will remain  in effect (meaning this  test can be used) for the duration of the COVID-19 declaration under Section 564(b)(1) of the Act, 21 U.S.C.section 360bbb-3(b)(1), unless the authorization is terminated  or revoked sooner.       Influenza A by PCR NEGATIVE NEGATIVE Final   Influenza B by PCR NEGATIVE NEGATIVE Final    Comment: (NOTE) The Xpert Xpress SARS-CoV-2/FLU/RSV plus assay is intended as an aid in the diagnosis of influenza from Nasopharyngeal swab specimens and should not be used as a sole basis for treatment. Nasal washings and aspirates are unacceptable for Xpert Xpress SARS-CoV-2/FLU/RSV testing.  Fact Sheet for Patients: [place]  Fact Sheet for Healthcare Providers: BloggerCourse.com  This test is not yet approved or cleared by the [place] FDA and has been  authorized for detection and/or diagnosis of SARS-CoV-2 by FDA under an Emergency Use Authorization (EUA). This EUA will remain in effect (meaning this test can be used) for the duration of the COVID-19 declaration under Section 564(b)(1) of the Act, 21 U.S.C. section 360bbb-3(b)(1), unless the authorization is terminated or revoked.  Performed at [facility], [address] [place], [place] [address]      Labs: BNP (last 3 results) Recent Labs    11/29/20 0951 04/21/21 0825 05/14/21 0737  BNP 67.5 157.6* 106.2*   Basic Metabolic Panel: Recent Labs  Lab 05/12/21 0715 05/14/21 0042 05/14/21 0737 05/15/21 0415 05/17/21 0652  NA 142 139  --  139 139  K 3.5 3.4*  --  4.0 4.1  CL 107 105  --  109 104  CO2 26 25  --  23 25  GLUCOSE 193* 227*  --  272* 203*  BUN 13 12  --  22 18  CREATININE 0.61 0.65  --  0.72 0.83  CALCIUM 9.4 9.3  --  8.7* 8.7*  MG  --   --  1.5* 1.9  --    Liver Function Tests: Recent Labs  Lab 05/12/21 0715  AST 16  ALT 15  ALKPHOS 70  BILITOT 1.2  PROT 6.9  ALBUMIN 3.9   No results for input(s): LIPASE, AMYLASE in the last  168 hours. No results for input(s): AMMONIA in the last 168 hours. CBC: Recent Labs  Lab 05/12/21 0715 05/14/21 0042 05/15/21 0415 05/16/21 0409 05/17/21 0652  WBC 9.1 11.2* 19.0* 29.2* 14.5*  HGB 13.3 12.5 11.1* 13.4 13.6  HCT 39.2 36.5 33.1* 40.9 40.9  MCV 86.0 88.2 88.0 89.5 87.8  PLT 225 247 224 334 316   Cardiac Enzymes: No results for input(s): CKTOTAL, CKMB, CKMBINDEX, TROPONINI in the last 168 hours. BNP: Invalid input(s): POCBNP CBG: Recent Labs  Lab 05/16/21 1650 05/16/21 2039 05/17/21 0730 05/17/21 1117 05/17/21 1624  GLUCAP 136* 123* 193* 191* 204*   D-Dimer No results for input(s): DDIMER in the last 72 hours. Hgb A1c No results for input(s): HGBA1C in the last 72 hours. Lipid Profile No results for input(s): CHOL, HDL, LDLCALC, TRIG, CHOLHDL, LDLDIRECT in the last 72 hours. Thyroid function studies No results for input(s): TSH, T4TOTAL, T3FREE, THYROIDAB in the last 72 hours.  Invalid input(s): FREET3 Anemia work up No results for input(s): VITAMINB12, FOLATE, FERRITIN, TIBC, IRON, RETICCTPCT in the last 72 hours. Urinalysis    Component Value Date/Time   COLORURINE YELLOW (A) 03/18/2021 1019   APPEARANCEUR CLOUDY (A) 03/18/2021 1019   APPEARANCEUR Clear 02/18/2013 2004   LABSPEC 1.021 03/18/2021 1019   LABSPEC 1.024 02/18/2013 2004   PHURINE 5.0 03/18/2021 1019   GLUCOSEU NEGATIVE 03/18/2021 1019   GLUCOSEU NEGATIVE 01/28/2015 0810   HGBUR NEGATIVE 03/18/2021 1019   BILIRUBINUR NEGATIVE 03/18/2021 1019   BILIRUBINUR neg 01/16/2015 1437   BILIRUBINUR Negative 02/18/2013 2004   KETONESUR NEGATIVE 03/18/2021 1019   PROTEINUR NEGATIVE 03/18/2021 1019   UROBILINOGEN 0.2 01/28/2015 0810   NITRITE NEGATIVE 03/18/2021 1019   LEUKOCYTESUR TRACE (A) 03/18/2021 1019   LEUKOCYTESUR Trace 02/18/2013 2004   Sepsis Labs Invalid input(s): PROCALCITONIN,  WBC,  LACTICIDVEN Microbiology Recent Results (from the past 240 hour(s))  Resp Panel by RT-PCR  (Flu A&B, Covid) Nasopharyngeal Swab     Status: None   Collection Time: 05/14/21  3:39 AM   Specimen: Nasopharyngeal Swab; Nasopharyngeal(NP) swabs in vial transport medium  Result Value Ref Range Status  SARS Coronavirus 2 by RT PCR NEGATIVE NEGATIVE Final    Comment: (NOTE) SARS-CoV-2 target nucleic acids are NOT DETECTED.  The SARS-CoV-2 RNA is generally detectable in upper respiratory specimens during the acute phase of infection. The lowest concentration of SARS-CoV-2 viral copies this assay can detect is 138 copies/mL. A negative result does not preclude SARS-Cov-2 infection and should not be used as the sole basis for treatment or other patient management decisions. A negative result may occur with  improper specimen collection/handling, submission of specimen other than nasopharyngeal swab, presence of viral mutation(s) within the areas targeted by this assay, and inadequate number of viral copies(<138 copies/mL). A negative result must be combined with clinical observations, patient history, and epidemiological information. The expected result is Negative.  Fact Sheet for Patients:  BloggerCourse.com  Fact Sheet for Healthcare Providers:  [employer]ious[employer]ker.it  This test is no t yet approved or cleared by the [place] FDA and  has been authorized for detection and/or diagnosis of SARS-CoV-2 by FDA under an Emergency Use Authorization (EUA). This EUA will remain  in effect (meaning this test can be used) for the duration of the COVID-19 declaration under Section 564(b)(1) of the Act, 21 U.S.C.section 360bbb-3(b)(1), unless the authorization is terminated  or revoked sooner.       Influenza A by PCR NEGATIVE NEGATIVE Final   Influenza B by PCR NEGATIVE NEGATIVE Final    Comment: (NOTE) The Xpert Xpress SARS-CoV-2/FLU/RSV plus assay is intended as an aid in the diagnosis of influenza from Nasopharyngeal swab specimens  and should not be used as a sole basis for treatment. Nasal washings and aspirates are unacceptable for Xpert Xpress SARS-CoV-2/FLU/RSV testing.  Fact Sheet for Patients: BloggerCourse.com  Fact Sheet for Healthcare Providers: [employer]ious[employer]ker.it  This test is not yet approved or cleared by the [place] FDA and has been authorized for detection and/or diagnosis of SARS-CoV-2 by FDA under an Emergency Use Authorization (EUA). This EUA will remain in effect (meaning this test can be used) for the duration of the COVID-19 declaration under Section 564(b)(1) of the Act, 21 U.S.C. section 360bbb-3(b)(1), unless the authorization is terminated or revoked.  Performed at [facility], [address], [place], [place] [address]      Time coordinating discharge: 42 minutes.   SIGNED:   Kathlen Mody, MD  [facility]

## 2021-05-18 NOTE — Telephone Encounter (Signed)
Transition Care Management Follow-up Telephone Call Date of discharge and from where: 05/17/21 from [facility] How have you been since you were released from the hospital? I am doing better. Resting well. Breathing spirometer in use twice daily. Denies SOB, cough, pain, dizziness, fever, wheezing. No oxygen in use.  Any questions or concerns? No  Items Reviewed: Did the pt receive and understand the discharge instructions provided? Yes  Medications obtained and verified? Yes  Any new allergies since your discharge? No  Dietary orders reviewed? Yes Do you have support at home? Yes   Home Care and Equipment/Supplies: Were home health services ordered? no  Functional Questionnaire: (I = Independent and D = Dependent) ADLs: I  Bathing/Dressing- I  Meal Prep- I  Eating- I  Maintaining continence- I  Transferring/Ambulation- Walker when leaving   Managing Meds- I  Follow up appointments reviewed:  PCP Hospital f/u appt confirmed? Yes  Scheduled to see Dr. Lorin Picket on 7/21 @ 2:30. Specialist Hospital f/u appt confirmed?  Plans to schedule with pulmonology post hfu with pcp. Are transportation arrangements needed? No  If their condition worsens, is the pt aware to call PCP or go to the Emergency Dept.? Yes Was the patient provided with contact information for the PCP's office or ED? Yes Was to pt encouraged to call back with questions or concerns? Yes

## 2021-05-20 ENCOUNTER — Ambulatory Visit (INDEPENDENT_AMBULATORY_CARE_PROVIDER_SITE_OTHER): Payer: Medicare HMO | Admitting: Internal Medicine

## 2021-05-20 ENCOUNTER — Encounter: Payer: Self-pay | Admitting: Internal Medicine

## 2021-05-20 ENCOUNTER — Other Ambulatory Visit: Payer: Self-pay

## 2021-05-20 VITALS — BP 136/72 | HR 83 | Temp 97.9°F | Resp 16 | Ht 61.0 in | Wt 123.2 lb

## 2021-05-20 DIAGNOSIS — J449 Chronic obstructive pulmonary disease, unspecified: Secondary | ICD-10-CM | POA: Diagnosis not present

## 2021-05-20 DIAGNOSIS — I10 Essential (primary) hypertension: Secondary | ICD-10-CM | POA: Diagnosis not present

## 2021-05-20 DIAGNOSIS — D72829 Elevated white blood cell count, unspecified: Secondary | ICD-10-CM | POA: Diagnosis not present

## 2021-05-20 DIAGNOSIS — E7800 Pure hypercholesterolemia, unspecified: Secondary | ICD-10-CM | POA: Diagnosis not present

## 2021-05-20 DIAGNOSIS — E1159 Type 2 diabetes mellitus with other circulatory complications: Secondary | ICD-10-CM

## 2021-05-20 DIAGNOSIS — I5032 Chronic diastolic (congestive) heart failure: Secondary | ICD-10-CM

## 2021-05-20 DIAGNOSIS — I25118 Atherosclerotic heart disease of native coronary artery with other forms of angina pectoris: Secondary | ICD-10-CM

## 2021-05-20 NOTE — Progress Notes (Signed)
[name], female   DOB: 12-06-1932, 87 y.o.   MRN: [id number]   Subjective:    [name], female    DOB: 11/09/1932, 87 y.o.   MRN: [id number]  HPI This visit occurred during the SARS-CoV-2 public health emergency.  Safety protocols were in place, including screening questions prior to the visit, additional usage of staff PPE, and extensive cleaning of exam room while observing appropriate contact time as indicated for disinfecting solutions.   Patient here for hospital follow up.  She was admitted from 05/14/2021 to 7/18 with acute respiratory failure with hypoxia.  She is accompanied by her daughter.  History obtained from both of them.  This was felt to be secondary to COPD/asthma exacerbation.  Initially required 2 L of nasal cannula oxygen.  She was weaned off of her oxygen and was on room air at discharge.  She was found to have an elevated lactic acid level.  She was started on IV fluids.  This resolved.  She has a history of chronic diastolic heart failure.  Last echocardiogram October 2021 revealed preserved LV function.  She has grade 2 diastolic dysfunction.  BNP slightly elevated.  Discharged on oral prednisone.  States she is feeling better.  Breathing better and overall relatively stable.  No chest pain.  No increased cough or congestion.  No acid reflux.  No abdominal pain.  Bowels moving.  Has f/u with cardiology tomorrow.   Past Medical History:  Diagnosis Date   Aortic insufficiency    a. TTE 12/19: EF 55-60%, probable HK of the mid apical anterior septal myocardium, Gr1DD, mild AI, mildly dilated LA   Asthma    CAD (coronary artery disease)    a. NSTEMI 12/19; b. LHC 10/08/18: LM minimal luminal irregs, mLAD-1 95% s/p PCI/DES, mLAD-2 60%, LCx mild diffuse disease throughout, RCA minimal luminal irregs; b. 03/2020 MV: EF>65%, no ischemia/scar.   CHF (congestive heart failure) ([place])    Diabetes mellitus ([place])    Hypercholesterolemia     Hypertension    Myocardial infarction ([place])    Osteopenia    Palpitations    a. 04/2020 Zio: Avg HR 75. 429 SVT episodes, longest 19 secs @ 133. Occas PACs (3.2%). Rare PVCs (<1%).   Polymyalgia rheumatica syndrome ([place])    Reactive airway disease    Past Surgical History:  Procedure Laterality Date   ABDOMINAL HYSTERECTOMY  1981   prolapse and bleeding, ovaries not removed   BREAST EXCISIONAL BIOPSY Right    CHOLECYSTECTOMY N/A 09/02/2019   Procedure: LAPAROSCOPIC CHOLECYSTECTOMY WITH INTRAOPERATIVE CHOLANGIOGRAM;  Surgeon: Olean Ree, MD;  Location: [facility];  Service: General;  Laterality: N/A;   CORONARY STENT INTERVENTION N/A 10/08/2018   Procedure: CORONARY STENT INTERVENTION;  Surgeon: Wellington Hampshire, MD;  Location: [facility];  Service: Cardiovascular;  Laterality: N/A;   ENDOSCOPIC RETROGRADE CHOLANGIOPANCREATOGRAPHY (ERCP) WITH PROPOFOL N/A 08/08/2019   Procedure: ENDOSCOPIC RETROGRADE CHOLANGIOPANCREATOGRAPHY (ERCP) WITH PROPOFOL;  Surgeon: Lucilla Lame, MD;  Location: [facility];  Service: Endoscopy;  Laterality: N/A;   LEFT HEART CATH AND CORONARY ANGIOGRAPHY N/A 10/08/2018   Procedure: LEFT HEART CATH AND CORONARY ANGIOGRAPHY;  Surgeon: Wellington Hampshire, MD;  Location: [facility];  Service: Cardiovascular;  Laterality: N/A;   LEFT HEART CATH AND CORONARY ANGIOGRAPHY N/A 08/10/2020   Procedure: LEFT HEART CATH AND CORONARY ANGIOGRAPHY possible percutaneous intervention;  Surgeon: Wellington Hampshire, MD;  Location: [facility];  Service: Cardiovascular;  Laterality:  N/A;   UMBILICAL HERNIA REPAIR  7/94   Family History  Problem Relation Age of Onset   Arthritis Mother    Heart disease Mother    Heart attack Father    Throat cancer Sister    Parkinson's disease Sister    COPD Brother    Social History   Socioeconomic History   Marital status: Widowed    Spouse name: Not on file   Number of children: 3   Years of education: Not on  file   Highest education level: Not on file  Occupational History   Not on file  Tobacco Use   Smoking status: Never   Smokeless tobacco: Never  Vaping Use   Vaping Use: Never used  Substance and Sexual Activity   Alcohol use: No    Alcohol/week: 0.0 standard drinks   Drug use: No   Sexual activity: Not Currently  Other Topics Concern   Not on file  Social History Narrative   No smoking; no alcohol; in [place]; worked in [employer] fundraiser. Lives by self in [place]. Does all of her own housework. Dtr does food shopping for her.   Social Determinants of Health   Financial Resource Strain: Low Risk    Difficulty of Paying Living Expenses: Not hard at all  Food Insecurity: No Food Insecurity   Worried About Char[employer] fundraiser in the Last Year: Never true   [place] in the Last Year: Never true  Transportation Needs: No Transportation Needs   Lack of Transportation (Medical): No   Lack of Transportation (Non-Medical): No  Physical Activity: Not on file  Stress: No Stress Concern Present   Feeling of Stress : Not at all  Social Connections: Moderately Integrated   Frequency of Communication with Friends and Family: More than three times a week   Frequency of Social Gatherings with Friends and Family: More than three times a week   Attends Religious Services: More than 4 times per year   Active Member of [employer] or Organizations: Yes   Attends Archivist Meetings: Not on file   Marital Status: Widowed    Review of Systems  Constitutional:  Negative for appetite change and unexpected weight change.  HENT:  Negative for congestion and sinus pressure.   Respiratory:  Negative for cough and chest tightness.        Breathing stable.   Cardiovascular:  Negative for chest pain, palpitations and leg swelling.  Gastrointestinal:  Negative for abdominal pain, diarrhea, nausea and vomiting.  Genitourinary:  Negative for difficulty urinating and dysuria.  Musculoskeletal:   Negative for joint swelling and myalgias.  Skin:  Negative for color change and rash.  Neurological:  Negative for dizziness, light-headedness and headaches.  Psychiatric/Behavioral:  Negative for agitation and dysphoric mood.       Objective:    Physical Exam Vitals reviewed.  Constitutional:      General: She is not in acute distress.    Appearance: Normal appearance.  HENT:     Head: Normocephalic and atraumatic.     Right Ear: External ear normal.     Left Ear: External ear normal.  Eyes:     General: No scleral icterus.       Right eye: No discharge.        Left eye: No discharge.     Conjunctiva/sclera: Conjunctivae normal.  Neck:     Thyroid: No thyromegaly.  Cardiovascular:     Rate and Rhythm: Normal rate and regular  rhythm.  Pulmonary:     Effort: No respiratory distress.     Breath sounds: Normal breath sounds. No wheezing.  Abdominal:     General: Bowel sounds are normal.     Palpations: Abdomen is soft.     Tenderness: There is no abdominal tenderness.  Musculoskeletal:        General: No swelling or tenderness.     Cervical back: Neck supple. No tenderness.  Lymphadenopathy:     Cervical: No cervical adenopathy.  Skin:    Findings: No erythema or rash.  Neurological:     Mental Status: She is alert.  Psychiatric:        Mood and Affect: Mood normal.        Behavior: Behavior normal.    BP 136/72   Pulse 83   Temp 97.9 F (36.6 C)   Resp 16   Ht '5\' 1"'  (1.549 m)   Wt 123 lb 3.2 oz (55.9 kg)   SpO2 97%   BMI 23.28 kg/m  Wt Readings from Last 3 Encounters:  05/20/21 123 lb 3.2 oz (55.9 kg)  05/17/21 122 lb 2.2 oz (55.4 kg)  05/12/21 125 lb (56.7 kg)    Outpatient Encounter Medications as of 05/20/2021  Medication Sig   ADVAIR DISKUS 250-50 MCG/DOSE AEPB INHALE ONE PUFF BY MOUTH EVERY 12 HOURS. RINSE MOUTH AFTER EACH USE   alendronate (FOSAMAX) 70 MG tablet Take 70 mg by mouth once a week.    aspirin 81 MG tablet Take 81 mg by mouth daily.    atorvastatin (LIPITOR) 80 MG tablet TAKE 1 TABLET BY MOUTH ONCE DAILY AT  6PM   dextromethorphan-guaiFENesin (MUCINEX DM) 30-600 MG 12hr tablet Take 1 tablet by mouth 2 (two) times daily as needed for cough.   fluticasone (FLONASE) 50 MCG/ACT nasal spray Use 2 spray(s) in each nostril once daily   ipratropium-albuterol (DUONEB) 0.5-2.5 (3) MG/3ML SOLN Take 3 mLs by nebulization 2 (two) times daily.   lisinopril (ZESTRIL) 20 MG tablet Take 1 tablet by mouth once daily   metFORMIN (GLUCOPHAGE) 500 MG tablet TAKE 1 TABLET BY MOUTH TWICE DAILY WITH A MEAL   metoprolol tartrate (LOPRESSOR) 25 MG tablet Take 1 tablet by mouth twice daily   nitroGLYCERIN (NITROSTAT) 0.4 MG SL tablet Place 1 tablet (0.4 mg total) under the tongue every 5 (five) minutes as needed for chest pain.   predniSONE (DELTASONE) 20 MG tablet Take 2 tablets (40 mg total) by mouth daily before breakfast for 5 days.   PROAIR HFA 108 (90 Base) MCG/ACT inhaler Inhale 2 puffs into the lungs every 4 (four) hours as needed for wheezing or shortness of breath.   Vitamin D, Ergocalciferol, (DRISDOL) 50000 units CAPS capsule Take 50,000 Units by mouth once a week.   No facility-administered encounter medications on file as of 05/20/2021.     Lab Results  Component Value Date   WBC 14.5 (H) 05/17/2021   HGB 13.6 05/17/2021   HCT 40.9 05/17/2021   PLT 316 05/17/2021   GLUCOSE 203 (H) 05/17/2021   CHOL 207 (H) 03/31/2021   TRIG 192.0 (H) 03/31/2021   HDL 48.50 03/31/2021   LDLDIRECT 136.0 07/03/2020   LDLCALC 120 (H) 03/31/2021   ALT 15 05/12/2021   AST 16 05/12/2021   NA 139 05/17/2021   K 4.1 05/17/2021   CL 104 05/17/2021   CREATININE 0.83 05/17/2021   BUN 18 05/17/2021   CO2 25 05/17/2021   TSH 3.56 03/31/2021   INR 1.1  08/07/2020   HGBA1C 7.3 (H) 03/31/2021   MICROALBUR 3.1 (H) 07/03/2020    DG Chest Portable 1 View  Result Date: 05/14/2021 CLINICAL DATA:  Chest pain EXAM: PORTABLE CHEST 1 VIEW COMPARISON:  05/12/2021  FINDINGS: Cardiac shadow is stable. Aortic calcifications are noted. Lungs are well aerated bilaterally. Calcified granuloma in the left base is seen. No bony abnormality is noted. IMPRESSION: No acute abnormality noted. Electronically Signed   By: Inez Catalina M.D.   On: 05/14/2021 01:25       Assessment & Plan:   Problem List Items Addressed This Visit     Chronic diastolic CHF (congestive heart failure) (HCC)    Continue lisinopril, imdur and metoprolol.  Breathing at baseline now.  Follow.        COPD (chronic obstructive pulmonary disease) ([place])    Recently admitted with copd exacerbation as outlined.  Continue advair.  Rescue inhaler if needed.  On prednisone.  Breathing improved.  Discussed f/u with pulmonary.         Coronary artery disease of native heart with stable angina pectoris ([place])    Known CAD s/p PCI/DES - mid LAD.  Continue metoprolol, imdur and lisinopril.  No chest pain.  Breathing stable.         Hypercholesterolemia    Continue lipitor.  Low cholesterol diet and exercise.  Follow lipid panel and liver function tests.         Hypertension    Continue lisinopril, imdur and metoprolol.  Blood pressure elevated on recheck - 459 systolic.  Will hold on making changes in medication.  On prednisone.  Have her spot check pressures.  If persistent elevation, will require medication adjustment.   Follow metabolic panel       Relevant Orders   Basic metabolic panel   Leukocytosis    Recheck cbc today.  On steroids.         Relevant Orders   CBC with Differential/Platelet   Type 2 diabetes mellitus with cardiac complication (HCC) - Primary    Low carb diet and exercise.  Discussed sugar elevation with prednisone.  Follow sugars.  Follow met b and a1c.          Einar Pheasant, MD

## 2021-05-21 ENCOUNTER — Encounter: Payer: Self-pay | Admitting: Nurse Practitioner

## 2021-05-21 ENCOUNTER — Ambulatory Visit (INDEPENDENT_AMBULATORY_CARE_PROVIDER_SITE_OTHER): Payer: Medicare HMO | Admitting: Nurse Practitioner

## 2021-05-21 ENCOUNTER — Encounter: Payer: Self-pay | Admitting: Internal Medicine

## 2021-05-21 VITALS — BP 150/80 | HR 62 | Ht 61.0 in | Wt 122.0 lb

## 2021-05-21 DIAGNOSIS — I10 Essential (primary) hypertension: Secondary | ICD-10-CM | POA: Diagnosis not present

## 2021-05-21 DIAGNOSIS — J449 Chronic obstructive pulmonary disease, unspecified: Secondary | ICD-10-CM | POA: Insufficient documentation

## 2021-05-21 DIAGNOSIS — I5032 Chronic diastolic (congestive) heart failure: Secondary | ICD-10-CM | POA: Diagnosis not present

## 2021-05-21 DIAGNOSIS — E785 Hyperlipidemia, unspecified: Secondary | ICD-10-CM

## 2021-05-21 DIAGNOSIS — I2510 Atherosclerotic heart disease of native coronary artery without angina pectoris: Secondary | ICD-10-CM

## 2021-05-21 DIAGNOSIS — I471 Supraventricular tachycardia: Secondary | ICD-10-CM | POA: Diagnosis not present

## 2021-05-21 LAB — BASIC METABOLIC PANEL
BUN: 26 mg/dL — ABNORMAL HIGH (ref 6–23)
CO2: 25 mEq/L (ref 19–32)
Calcium: 9.6 mg/dL (ref 8.4–10.5)
Chloride: 103 mEq/L (ref 96–112)
Creatinine, Ser: 0.78 mg/dL (ref 0.40–1.20)
GFR: 69.01 mL/min (ref >60.00)
Glucose, Bld: 250 mg/dL — ABNORMAL HIGH (ref 70–99)
Potassium: 4.4 mEq/L (ref 3.5–5.1)
Sodium: 140 mEq/L (ref 135–145)

## 2021-05-21 LAB — CBC WITH DIFFERENTIAL/PLATELET
Basophils Absolute: 0 10*3/uL (ref 0.0–0.1)
Basophils Relative: 0.1 % (ref 0.0–3.0)
Eosinophils Absolute: 0 10*3/uL (ref 0.0–0.7)
Eosinophils Relative: 0.1 % (ref 0.0–5.0)
HCT: 38 % (ref 36.0–46.0)
Hemoglobin: 12.7 g/dL (ref 12.0–15.0)
Lymphocytes Relative: 6.6 % — ABNORMAL LOW (ref 12.0–46.0)
Lymphs Abs: 0.7 10*3/uL (ref 0.7–4.0)
MCHC: 33.3 g/dL (ref 30.0–36.0)
MCV: 87.5 fl (ref 78.0–100.0)
Monocytes Absolute: 0.2 10*3/uL (ref 0.1–1.0)
Monocytes Relative: 1.7 % — ABNORMAL LOW (ref 3.0–12.0)
Neutro Abs: 9.6 10*3/uL — ABNORMAL HIGH (ref 1.4–7.7)
Neutrophils Relative %: 91.5 % — ABNORMAL HIGH (ref 43.0–77.0)
Platelets: 344 10*3/uL (ref 150.0–400.0)
RBC: 4.35 Mil/uL (ref 3.87–5.11)
RDW: 13.5 % (ref 11.5–15.5)
WBC: 10.5 10*3/uL (ref 4.0–10.5)

## 2021-05-21 NOTE — Patient Instructions (Signed)
Medication Instructions:  No changes at this time.  *If you need a refill on your cardiac medications before your next appointment, please call your pharmacy*   Lab Work: None  If you have labs (blood work) drawn today and your tests are completely normal, you will receive your results only by: MyChart Message (if you have MyChart) OR A paper copy in the mail If you have any lab test that is abnormal or we need to change your treatment, we will call you to review the results.   Testing/Procedures: None    Follow-Up: At [facility], you and your health needs are our priority.  As part of our continuing mission to provide you with exceptional heart care, we have created designated Provider Care Teams.  These Care Teams include your primary Cardiologist (physician) and Advanced Practice Providers (APPs -  Physician Assistants and Nurse Practitioners) who all work together to provide you with the care you need, when you need it.  We recommend signing up for the patient portal called "MyChart".  Sign up information is provided on this After Visit Summary.  MyChart is used to connect with patients for Virtual Visits (Telemedicine).  Patients are able to view lab/test results, encounter notes, upcoming appointments, etc.  Non-urgent messages can be sent to your provider as well.   To learn more about what you can do with MyChart, go to [email or web address].    Your next appointment:   3-4 month(s)  The format for your next appointment:   In Person  Provider:   Lorine Bears, MD or Nicolasa Ducking, NP

## 2021-05-21 NOTE — Assessment & Plan Note (Signed)
Recently admitted with copd exacerbation as outlined.  Continue advair.  Rescue inhaler if needed.  On prednisone.  Breathing improved.  Discussed f/u with pulmonary.

## 2021-05-21 NOTE — Assessment & Plan Note (Signed)
Known CAD s/p PCI/DES - mid LAD.  Continue metoprolol, imdur and lisinopril.  No chest pain.  Breathing stable.

## 2021-05-21 NOTE — Progress Notes (Signed)
Office Visit    Patient Name: Tanya Cole Date of Encounter: 05/21/2021  Primary Care Provider:  Dale Bertram, MD Primary Cardiologist:  Lorine Bears, MD  Chief Complaint    87 year old female with a history of CAD, HFpEF, diabetes, hypertension, hyperlipidemia, palpitations, Polymyalgia rheumatica, and asthma/COPD, who presents for follow-up of CAD.  Past Medical History    Past Medical History:  Diagnosis Date   (HFpEF) heart failure with preserved ejection fraction (HCC)    a. TTE 12/19: EF 55-60%, probable HK of the mid apical anterior septal myocardium, Gr1DD, mild AI, mildly dilated LA; b.07/2020 Echo: EF 60-65%, no rwma, Gr2 DD. Nl RV fxn. Mildly dil LA. Mild MR.   Asthma    CAD (coronary artery disease)    a. 09/2018 NSTEMI/PCI: LM min irregs, mLAD 95 (PCI/DES), mLAD-2 60%, LCx mild diff dzs, RCA min irregs; b. 03/2020 MV: EF>65%, no ischemia/scar; c. 07/2020 Cath: LM min irregs, LAD 30p, 37m, 90d, D1/2 min irregs, LCX diff dzs throughout, OM1/2/3 mild dzs, RCA 30p, RPDA/RPAV min irrges. EF 55-65%.   CHF (congestive heart failure) (HCC)    Diabetes mellitus (HCC)    Hypercholesterolemia    Hypertension    Myocardial infarction (HCC)    Osteopenia    Palpitations    a. 04/2020 Zio: Avg HR 75. 429 SVT episodes, longest 19 secs @ 133. Occas PACs (3.2%). Rare PVCs (<1%).   Polymyalgia rheumatica syndrome (HCC)    Reactive airway disease    Past Surgical History:  Procedure Laterality Date   ABDOMINAL HYSTERECTOMY  1981   prolapse and bleeding, ovaries not removed   BREAST EXCISIONAL BIOPSY Right    CHOLECYSTECTOMY N/A 09/02/2019   Procedure: LAPAROSCOPIC CHOLECYSTECTOMY WITH INTRAOPERATIVE CHOLANGIOGRAM;  Surgeon: Henrene Dodge, MD;  Location: [facility];  Service: General;  Laterality: N/A;   CORONARY STENT INTERVENTION N/A 10/08/2018   Procedure: CORONARY STENT INTERVENTION;  Surgeon: Iran Ouch, MD;  Location: [facility];  Service:  Cardiovascular;  Laterality: N/A;   ENDOSCOPIC RETROGRADE CHOLANGIOPANCREATOGRAPHY (ERCP) WITH PROPOFOL N/A 08/08/2019   Procedure: ENDOSCOPIC RETROGRADE CHOLANGIOPANCREATOGRAPHY (ERCP) WITH PROPOFOL;  Surgeon: Midge Minium, MD;  Location: [facility];  Service: Endoscopy;  Laterality: N/A;   LEFT HEART CATH AND CORONARY ANGIOGRAPHY N/A 10/08/2018   Procedure: LEFT HEART CATH AND CORONARY ANGIOGRAPHY;  Surgeon: Iran Ouch, MD;  Location: [facility];  Service: Cardiovascular;  Laterality: N/A;   LEFT HEART CATH AND CORONARY ANGIOGRAPHY N/A 08/10/2020   Procedure: LEFT HEART CATH AND CORONARY ANGIOGRAPHY possible percutaneous intervention;  Surgeon: Iran Ouch, MD;  Location: [facility];  Service: Cardiovascular;  Laterality: N/A;   UMBILICAL HERNIA REPAIR  7/94    Allergies  Allergies  Allergen Reactions   Tramadol Itching and Nausea And Vomiting    History of Present Illness    87 year old female with the above past medical history including CAD, HFpEF, diabetes, hypertension, hyperlipidemia, palpitations, polymyalgia rheumatica, palpitations, and asthma/COPD.  She suffered a non-STEMI in December 2019 and underwent PCI and drug-eluting stent placement to the mid LAD.  In early 2021 she was diagnosed with blood loss anemia with drop in hemoglobin to 8.4 requiring discontinuation of clopidogrel.  In October 2021, she suffered a non-STEMI.  Catheterization showed patent proximal LAD stent with mild in-stent restenosis, 60% mid LAD stenosis, and 90% apical LAD stenosis.  The apical LAD stenosis was felt to be the culprit but was not amenable to PCI given distal location and medical  therapy was recommended.  A few weeks later, she returned to the emergency department with chest pain and dyspnea.  Troponin was normal.  She was noted to have intermittent episodes of atrial tachycardia and frequent PACs requiring adjustment of beta-blocker dose.  Over the course of  2022 thus far, she has had multiple evaluations for COPD and respiratory failure including COVID infection in February 2022.  She was admitted in May with COPD flare.  She was seen by cardiology via virtual visit on June 7, at which time she denied chest pain or palpitations.  Unfortunately, she required admission between July 15 and July 18 with respiratory failure/COPD and hypoxia.  Lactate was elevated.  She was treated with IV fluids and steroids.  She was seen by primary care yesterday and noted to be improved.  Today, she notes that she is more or less back to baseline with regards to her respiratory status.  She does have chronic dyspnea with relatively minimal exertion, and this is overall unchanged.  She generally does not experience chest pain or pressure, though notes that she did have chest tightness on the evening that she was admitted with COPD flare.  It is notable that her troponins were normal at that time.  She has not had any recurrence of chest discomfort.  She denies palpitations, PND, orthopnea, dizziness, syncope, edema, or early satiety.  Home Medications    Current Outpatient Medications  Medication Sig Dispense Refill   ADVAIR DISKUS 250-50 MCG/DOSE AEPB INHALE ONE PUFF BY MOUTH EVERY 12 HOURS. RINSE MOUTH AFTER EACH USE 60 each 5   alendronate (FOSAMAX) 70 MG tablet Take 70 mg by mouth once a week.      aspirin 81 MG tablet Take 81 mg by mouth daily.     atorvastatin (LIPITOR) 80 MG tablet TAKE 1 TABLET BY MOUTH ONCE DAILY AT  6PM 90 tablet 3   dextromethorphan-guaiFENesin (MUCINEX DM) 30-600 MG 12hr tablet Take 1 tablet by mouth 2 (two) times daily as needed for cough. 30 tablet 0   fluticasone (FLONASE) 50 MCG/ACT nasal spray Use 2 spray(s) in each nostril once daily 16 g 0   ipratropium-albuterol (DUONEB) 0.5-2.5 (3) MG/3ML SOLN Take 3 mLs by nebulization 2 (two) times daily. 360 mL 7   lisinopril (ZESTRIL) 20 MG tablet Take 1 tablet by mouth once daily 90 tablet 0    metFORMIN (GLUCOPHAGE) 500 MG tablet TAKE 1 TABLET BY MOUTH TWICE DAILY WITH A MEAL 180 tablet 0   metoprolol tartrate (LOPRESSOR) 25 MG tablet Take 1 tablet by mouth twice daily 180 tablet 1   nitroGLYCERIN (NITROSTAT) 0.4 MG SL tablet Place 1 tablet (0.4 mg total) under the tongue every 5 (five) minutes as needed for chest pain. 30 tablet 12   predniSONE (DELTASONE) 20 MG tablet Take 2 tablets (40 mg total) by mouth daily before breakfast for 5 days. 10 tablet 0   PROAIR HFA 108 (90 Base) MCG/ACT inhaler Inhale 2 puffs into the lungs every 4 (four) hours as needed for wheezing or shortness of breath. 18 g 0   Vitamin D, Ergocalciferol, (DRISDOL) 50000 units CAPS capsule Take 50,000 Units by mouth once a week.  3   No current facility-administered medications for this visit.     Review of Systems    Chronic, stable dyspnea on exertion with significant improvement since hospitalization.  She has not had any recurrence of chest pain.  She denies palpitations, PND, orthopnea, dizziness, syncope, edema, or early satiety.  All  other systems reviewed and are otherwise negative except as noted above.  Physical Exam    VS:  BP (!) 150/80 (BP Location: Left Arm, Patient Position: Sitting, Cuff Size: Normal)   Pulse 62   Ht  (1.549 m)   Wt 122 lb (55.3 kg)   SpO2 98%   BMI 23.05 kg/m  , BMI Body mass index is 23.05 kg/m.     GEN: Well nourished, well developed, in no acute distress. HEENT: normal. Neck: Supple, no JVD, carotid bruits, or masses. Cardiac: RRR, occasional ectopic beat.  No murmurs, rubs, or gallops. No clubbing, cyanosis, edema.  Radials 2+; DP/PT 1+ and equal bilaterally.  Respiratory:  Respirations regular and unlabored, clear to auscultation bilaterally. GI: Soft, nontender, nondistended, BS + x 4. MS: no deformity or atrophy. Skin: warm and dry, no rash. Neuro:  Strength and sensation are intact. Psych: Normal affect.  Accessory Clinical Findings    ECG personally  reviewed by me today -regular sinus rhythm, 62, PACs - no acute changes.  Lab Results  Component Value Date   WBC 14.5 (H) 05/17/2021   HGB 13.6 05/17/2021   HCT 40.9 05/17/2021   MCV 87.8 05/17/2021   PLT 316 05/17/2021   Lab Results  Component Value Date   CREATININE 0.83 05/17/2021   BUN 18 05/17/2021   NA 139 05/17/2021   K 4.1 05/17/2021   CL 104 05/17/2021   CO2 25 05/17/2021   Lab Results  Component Value Date   ALT 15 05/12/2021   AST 16 05/12/2021   ALKPHOS 70 05/12/2021   BILITOT 1.2 05/12/2021   Lab Results  Component Value Date   CHOL 207 (H) 03/31/2021   HDL 48.50 03/31/2021   LDLCALC 120 (H) 03/31/2021   LDLDIRECT 136.0 07/03/2020   TRIG 192.0 (H) 03/31/2021   CHOLHDL 4 03/31/2021    Lab Results  Component Value Date   HGBA1C 7.3 (H) 03/31/2021    Assessment & Plan    1.  Coronary artery disease: Status post LAD stenting in 2019 with moderate, stable disease on catheterization October 2021.  Though she recently noted chest pressure in the setting of COPD flare prior to recent hospitalization, troponins during hospitalization were normal.  She has not had any recurrence of symptoms.  She remains on aspirin, statin, beta-blocker, and ACE inhibitor therapy.  No further ischemic work-up warranted at this time.  2.  History of atrial tachycardia: Quiescent on beta-blocker therapy.  3.  Essential hypertension: Blood pressure elevated today at 150/80.  She is currently on a prednisone taper which may be driving this, as pressures were previously well controlled.  She does have a cuff at home and I have recommended that she follow her pressures at home over the next 2 weeks with a goal systolic of less than 130.  She will notify us otherwise.  4.  Hyperlipidemia: LDL was 120 in June, which was significantly higher than October 2021 value of 37.  She does have an order in place for fasting labs to be repeated to confirm lipid status.  She remains on atorvastatin  therapy, and reports good compliance.  5.  COPD: Patient with multiple ED visits and recent admission secondary to COPD flares.  She never smoked but worked at [employer].  Patient's daughter asked if patient might benefit from oxygen therapy and we discussed that with a room air saturation of 98%, she does not qualify.  She is completing a prednisone taper and remains on inhaler  therapy otherwise.  6.  Type 2 diabetes mellitus: A1c 7.3 June 1.  She is on metformin therapy and followed by primary care.  7.  HFpEF:  euvolemic.  BP up, but may be 2/2 prednisone taper. As above, pt will follow bp @ home.  8.  Disposition: Patient will follow-up in clinic in 3 to 4 months or sooner if necessary.  Labs previously placed for fasting lipids.   Nicolasa Ducking, NP 05/21/2021, 10:25 AM

## 2021-05-21 NOTE — Assessment & Plan Note (Signed)
Continue lipitor.  Low cholesterol diet and exercise.  Follow lipid panel and liver function tests.   

## 2021-05-21 NOTE — Assessment & Plan Note (Signed)
Continue lisinopril, imdur and metoprolol.  Breathing at baseline now.  Follow.

## 2021-05-21 NOTE — Assessment & Plan Note (Addendum)
Continue lisinopril, imdur and metoprolol.  Blood pressure elevated on recheck - 142 systolic.  Will hold on making changes in medication.  On prednisone.  Have her spot check pressures.  If persistent elevation, will require medication adjustment.   Follow metabolic panel

## 2021-05-21 NOTE — Assessment & Plan Note (Signed)
Recheck cbc today.  On steroids.

## 2021-05-21 NOTE — Assessment & Plan Note (Signed)
Low carb diet and exercise.  Discussed sugar elevation with prednisone.  Follow sugars.  Follow met b and a1c.

## 2021-05-24 ENCOUNTER — Encounter: Payer: Self-pay | Admitting: Emergency Medicine

## 2021-05-24 ENCOUNTER — Emergency Department: Payer: Medicare HMO

## 2021-05-24 ENCOUNTER — Emergency Department
Admission: EM | Admit: 2021-05-24 | Discharge: 2021-05-25 | Disposition: A | Discharge status: Home / Self Care | Payer: Medicare HMO | Attending: Emergency Medicine | Admitting: Emergency Medicine

## 2021-05-24 ENCOUNTER — Other Ambulatory Visit: Payer: Self-pay

## 2021-05-24 DIAGNOSIS — J45909 Unspecified asthma, uncomplicated: Secondary | ICD-10-CM | POA: Insufficient documentation

## 2021-05-24 DIAGNOSIS — E7800 Pure hypercholesterolemia, unspecified: Secondary | ICD-10-CM | POA: Diagnosis not present

## 2021-05-24 DIAGNOSIS — I25118 Atherosclerotic heart disease of native coronary artery with other forms of angina pectoris: Secondary | ICD-10-CM | POA: Insufficient documentation

## 2021-05-24 DIAGNOSIS — J9811 Atelectasis: Secondary | ICD-10-CM | POA: Diagnosis not present

## 2021-05-24 DIAGNOSIS — R079 Chest pain, unspecified: Secondary | ICD-10-CM | POA: Insufficient documentation

## 2021-05-24 DIAGNOSIS — R112 Nausea with vomiting, unspecified: Secondary | ICD-10-CM | POA: Diagnosis not present

## 2021-05-24 DIAGNOSIS — M25562 Pain in left knee: Secondary | ICD-10-CM | POA: Diagnosis not present

## 2021-05-24 DIAGNOSIS — I509 Heart failure, unspecified: Secondary | ICD-10-CM | POA: Diagnosis not present

## 2021-05-24 DIAGNOSIS — Z79899 Other long term (current) drug therapy: Secondary | ICD-10-CM | POA: Diagnosis not present

## 2021-05-24 DIAGNOSIS — Z7982 Long term (current) use of aspirin: Secondary | ICD-10-CM | POA: Diagnosis not present

## 2021-05-24 DIAGNOSIS — Z7951 Long term (current) use of inhaled steroids: Secondary | ICD-10-CM | POA: Diagnosis not present

## 2021-05-24 DIAGNOSIS — Z7984 Long term (current) use of oral hypoglycemic drugs: Secondary | ICD-10-CM | POA: Diagnosis not present

## 2021-05-24 DIAGNOSIS — G8929 Other chronic pain: Secondary | ICD-10-CM | POA: Diagnosis not present

## 2021-05-24 DIAGNOSIS — R1111 Vomiting without nausea: Secondary | ICD-10-CM | POA: Diagnosis not present

## 2021-05-24 DIAGNOSIS — J449 Chronic obstructive pulmonary disease, unspecified: Secondary | ICD-10-CM | POA: Diagnosis not present

## 2021-05-24 DIAGNOSIS — Z955 Presence of coronary angioplasty implant and graft: Secondary | ICD-10-CM | POA: Diagnosis not present

## 2021-05-24 DIAGNOSIS — R0902 Hypoxemia: Secondary | ICD-10-CM | POA: Diagnosis not present

## 2021-05-24 DIAGNOSIS — J441 Chronic obstructive pulmonary disease with (acute) exacerbation: Secondary | ICD-10-CM | POA: Diagnosis not present

## 2021-05-24 DIAGNOSIS — I110 Hypertensive heart disease with heart failure: Secondary | ICD-10-CM | POA: Insufficient documentation

## 2021-05-24 DIAGNOSIS — R0602 Shortness of breath: Secondary | ICD-10-CM | POA: Insufficient documentation

## 2021-05-24 DIAGNOSIS — J9621 Acute and chronic respiratory failure with hypoxia: Secondary | ICD-10-CM | POA: Diagnosis not present

## 2021-05-24 DIAGNOSIS — E1159 Type 2 diabetes mellitus with other circulatory complications: Secondary | ICD-10-CM | POA: Insufficient documentation

## 2021-05-24 DIAGNOSIS — I2510 Atherosclerotic heart disease of native coronary artery without angina pectoris: Secondary | ICD-10-CM | POA: Diagnosis not present

## 2021-05-24 DIAGNOSIS — I5032 Chronic diastolic (congestive) heart failure: Secondary | ICD-10-CM | POA: Insufficient documentation

## 2021-05-24 LAB — CBC
HCT: 43.2 % (ref 36.0–46.0)
Hemoglobin: 14.7 g/dL (ref 12.0–15.0)
MCH: 29.9 pg (ref 26.0–34.0)
MCHC: 34 g/dL (ref 30.0–36.0)
MCV: 88 fL (ref 80.0–100.0)
Platelets: 272 10*3/uL (ref 150–400)
RBC: 4.91 MIL/uL (ref 3.87–5.11)
RDW: 13.2 % (ref 11.5–15.5)
WBC: 12.9 10*3/uL — ABNORMAL HIGH (ref 4.0–10.5)
nRBC: 0 % (ref 0.0–0.2)

## 2021-05-24 LAB — COMPREHENSIVE METABOLIC PANEL
ALT: 19 U/L (ref 0–44)
AST: 24 U/L (ref 15–41)
Albumin: 3.5 g/dL (ref 3.5–5.0)
Alkaline Phosphatase: 62 U/L (ref 38–126)
Anion gap: 7 (ref 5–15)
BUN: 20 mg/dL (ref 8–23)
CO2: 25 mmol/L (ref 22–32)
Calcium: 8.5 mg/dL — ABNORMAL LOW (ref 8.9–10.3)
Chloride: 106 mmol/L (ref 98–111)
Creatinine, Ser: 0.7 mg/dL (ref 0.44–1.00)
GFR, Estimated: >60 mL/min (ref >60)
Glucose, Bld: 189 mg/dL — ABNORMAL HIGH (ref 70–99)
Potassium: 3.6 mmol/L (ref 3.5–5.1)
Sodium: 138 mmol/L (ref 135–145)
Total Bilirubin: 0.9 mg/dL (ref 0.3–1.2)
Total Protein: 6.1 g/dL — ABNORMAL LOW (ref 6.5–8.1)

## 2021-05-24 LAB — LIPASE, BLOOD: Lipase: 63 U/L — ABNORMAL HIGH (ref 11–51)

## 2021-05-24 LAB — TROPONIN I (HIGH SENSITIVITY): Troponin I (High Sensitivity): 9 ng/L (ref <18)

## 2021-05-24 LAB — BRAIN NATRIURETIC PEPTIDE: B Natriuretic Peptide: 73.7 pg/mL (ref 0.0–100.0)

## 2021-05-24 MED ORDER — IPRATROPIUM-ALBUTEROL 0.5-2.5 (3) MG/3ML IN SOLN
3.0000 mL | Freq: Once | RESPIRATORY_TRACT | Status: AC
  Administered 2021-05-24: 3 mL via RESPIRATORY_TRACT
  Filled 2021-05-24: qty 3

## 2021-05-24 MED ORDER — ONDANSETRON 4 MG PO TBDP: 4.0000 mg | ORAL_TABLET | Freq: Three times a day (TID) | ORAL | 0 refills | Status: DC | PRN

## 2021-05-24 NOTE — ED Triage Notes (Signed)
Pt comes into the ED via [facility] from home c/o [facility] that started today and an episode of emesis.  Pt states she does have COPD and CHF.  Pt does not wear O2 chronically.  Pt was NSR with EMS with a HR in the 90's.

## 2021-05-24 NOTE — ED Provider Notes (Signed)
[facility] Emergency Department Provider Note  ____________________________________________   Event Date/Time   First MD Initiated Contact with Patient 05/24/21 1951     (approximate)  I have reviewed the triage vital signs and the nursing notes.   HISTORY  Chief Complaint Shortness of Breath and Emesis    HPI Tanya Cole is a 87 y.o. female with past medical history as below including coronary disease, CHF, hypertension, COPD, here with shortness of breath.  The patient has had multiple recent visits for shortness of breath and chest pain, including recent admission for this.  She has been recently evaluated by cardiology and her primary doctor.  She states that earlier tonight, she felt more short of breath than she had.  She became anxious about this.  She then got nauseous and vomited which she believes made her shortness of breath worse.  She no longer feels nauseous.  She has no abdominal pain.  She currently feels better.  She denies any chest pain.  She states she feels about as well as she did at discharge.  She has been taking her medications as prescribed.  No specific alleviating or aggravating factors.    Past Medical History:  Diagnosis Date   (HFpEF) heart failure with preserved ejection fraction (HCC)    a. TTE 12/19: EF 55-60%, probable HK of the mid apical anterior septal myocardium, Gr1DD, mild AI, mildly dilated LA; b.07/2020 Echo: EF 60-65%, no rwma, Gr2 DD. Nl RV fxn. Mildly dil LA. Mild MR.   Asthma    CAD (coronary artery disease)    a. 09/2018 NSTEMI/PCI: LM min irregs, mLAD 95 (PCI/DES), mLAD-2 60%, LCx mild diff dzs, RCA min irregs; b. 03/2020 MV: EF>65%, no ischemia/scar; c. 07/2020 Cath: LM min irregs, LAD 30p, 74m, 90d, D1/2 min irregs, LCX diff dzs throughout, OM1/2/3 mild dzs, RCA 30p, RPDA/RPAV min irrges. EF 55-65%.   CHF (congestive heart failure) (HCC)    Diabetes mellitus (HCC)    Hypercholesterolemia     Hypertension    Myocardial infarction (HCC)    Osteopenia    Palpitations    a. 04/2020 Zio: Avg HR 75. 429 SVT episodes, longest 19 secs @ 133. Occas PACs (3.2%). Rare PVCs (<1%).   Polymyalgia rheumatica syndrome (HCC)    Reactive airway disease     Patient Active Problem List   Diagnosis Date Noted   COPD (chronic obstructive pulmonary disease) (HCC) 05/21/2021   Hypomagnesemia 05/14/2021   CAD (coronary artery disease)    Severe sepsis (HCC)    Hypokalemia    Acute on chronic respiratory failure with hypoxia (HCC)    Asthma exacerbation    Joint ache 04/09/2021   Calcified granuloma of lung (HCC) 03/02/2021   Acute exacerbation of chronic obstructive pulmonary disease (COPD) (HCC) 03/01/2021   COPD exacerbation (HCC) 11/29/2020   Chronic diastolic CHF (congestive heart failure) (HCC) 11/29/2020   CAP (community acquired pneumonia) 11/29/2020   Rib pain on right side 10/19/2020   Abnormal CXR 10/19/2020   Type 2 diabetes mellitus with cardiac complication (HCC) 10/08/2020   Elevated troponin 10/08/2020   Acute on chronic heart failure with preserved ejection fraction (HFpEF) (HCC) 10/08/2020   COPD with acute exacerbation (HCC) 08/19/2020   History of non-ST elevation myocardial infarction (NSTEMI) 08/07/2020   Asthma 08/07/2020   Leukocytosis 08/07/2020   Left shoulder pain 07/12/2020   Iron deficiency 04/16/2020   Anemia 04/04/2020   SOB (shortness of breath) 03/25/2020   Cough 09/30/2019  Gallstone pancreatitis    Pre-op evaluation 08/30/2019   Cholecystitis 08/05/2019   Choledocholithiasis    Respiratory illness 06/18/2019   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) 12/29/2018   Chest pain 10/07/2018   Memory change 05/27/2018   Osteoporosis 02/05/2018   Weight loss 06/24/2017   Abdominal pain, left lower quadrant 10/05/2016   Long term current use of systemic steroids 05/16/2016   Elevated erythrocyte sedimentation rate  05/04/2016   Back pain 04/29/2016   Fatigue 05/24/2015   Health care maintenance 01/25/2015   UTI (urinary tract infection) 07/07/2014   Neuropathy 03/24/2014   Diverticulitis 02/24/2013   Reactive airway disease 09/15/2012   Hypertension 09/14/2012   Hypercholesterolemia 09/14/2012   Diabetes mellitus with cardiac complication (HCC) 09/14/2012    Past Surgical History:  Procedure Laterality Date   ABDOMINAL HYSTERECTOMY  1981   prolapse and bleeding, ovaries not removed   BREAST EXCISIONAL BIOPSY Right    CHOLECYSTECTOMY N/A 09/02/2019   Procedure: LAPAROSCOPIC CHOLECYSTECTOMY WITH INTRAOPERATIVE CHOLANGIOGRAM;  Surgeon: Henrene Dodge, MD;  Location: [facility] ORS;  Service: General;  Laterality: N/A;   CORONARY STENT INTERVENTION N/A 10/08/2018   Procedure: CORONARY STENT INTERVENTION;  Surgeon: Iran Ouch, MD;  Location: [facility] INVASIVE CV LAB;  Service: Cardiovascular;  Laterality: N/A;   ENDOSCOPIC RETROGRADE CHOLANGIOPANCREATOGRAPHY (ERCP) WITH PROPOFOL N/A 08/08/2019   Procedure: ENDOSCOPIC RETROGRADE CHOLANGIOPANCREATOGRAPHY (ERCP) WITH PROPOFOL;  Surgeon: Midge Minium, MD;  Location: [facility] ENDOSCOPY;  Service: Endoscopy;  Laterality: N/A;   LEFT HEART CATH AND CORONARY ANGIOGRAPHY N/A 10/08/2018   Procedure: LEFT HEART CATH AND CORONARY ANGIOGRAPHY;  Surgeon: Iran Ouch, MD;  Location: [facility] INVASIVE CV LAB;  Service: Cardiovascular;  Laterality: N/A;   LEFT HEART CATH AND CORONARY ANGIOGRAPHY N/A 08/10/2020   Procedure: LEFT HEART CATH AND CORONARY ANGIOGRAPHY possible percutaneous intervention;  Surgeon: Iran Ouch, MD;  Location: [facility] INVASIVE CV LAB;  Service: Cardiovascular;  Laterality: N/A;   UMBILICAL HERNIA REPAIR  7/94    Prior to Admission medications   Medication Sig Start Date End Date Taking? Authorizing Provider  ondansetron (ZOFRAN ODT) 4 MG disintegrating tablet Take 1 tablet (4 mg total) by mouth every 8 (eight) hours as needed for nausea or  vomiting. 05/24/21  Yes Shaune Pollack, MD  ADVAIR DISKUS 250-50 MCG/DOSE AEPB INHALE ONE PUFF BY MOUTH EVERY 12 HOURS. RINSE MOUTH AFTER EACH USE 02/17/16   Dale Osceola, MD  alendronate (FOSAMAX) 70 MG tablet Take 70 mg by mouth once a week.  11/06/17   [provider]  aspirin 81 MG tablet Take 81 mg by mouth daily.    [provider]  atorvastatin (LIPITOR) 80 MG tablet TAKE 1 TABLET BY MOUTH ONCE DAILY AT  6PM 05/04/21   Alver Sorrow, NP  dextromethorphan-guaiFENesin [facility] DM) 30-600 MG 12hr tablet Take 1 tablet by mouth 2 (two) times daily as needed for cough. 05/17/21   Kathlen Mody, MD  fluticasone (FLONASE) 50 MCG/ACT nasal spray Use 2 spray(s) in each nostril once daily 08/03/20   Dale [place], MD  ipratropium-albuterol (DUONEB) 0.5-2.5 (3) MG/3ML SOLN Take 3 mLs by nebulization 2 (two) times daily. 05/17/21   Kathlen Mody, MD  lisinopril (ZESTRIL) 20 MG tablet Take 1 tablet by mouth once daily 04/05/21   Shea Evans, Raymon Mutton, PA-C  metFORMIN (GLUCOPHAGE) 500 MG tablet TAKE 1 TABLET BY MOUTH TWICE DAILY WITH A MEAL 09/29/20   Dale [place], MD  metoprolol tartrate (LOPRESSOR) 25 MG tablet Take 1 tablet  by mouth twice daily 05/11/21   Iran Ouch, MD  nitroGLYCERIN (NITROSTAT) 0.4 MG SL tablet Place 1 tablet (0.4 mg total) under the tongue every 5 (five) minutes as needed for chest pain. 10/09/18   Adrian Saran, MD  PROAIR HFA 108 (90 Base) MCG/ACT inhaler Inhale 2 puffs into the lungs every 4 (four) hours as needed for wheezing or shortness of breath. 08/20/20   Rolly Salter, MD  Vitamin D, Ergocalciferol, (DRISDOL) 50000 units CAPS capsule Take 50,000 Units by mouth once a week. 06/24/18   [provider]    Allergies Tramadol  Family History  Problem Relation Age of Onset   Arthritis Mother    Heart disease Mother    Heart attack Father    Throat cancer Sister    Parkinson's disease Sister    COPD Brother     Social History Social History    Tobacco Use   Smoking status: Never   Smokeless tobacco: Never  Vaping Use   Vaping Use: Never used  Substance Use Topics   Alcohol use: No    Alcohol/week: 0.0 standard drinks   Drug use: No    Review of Systems  Review of Systems  Constitutional:  Positive for fatigue. Negative for fever.  HENT:  Negative for congestion and sore throat.   Eyes:  Negative for visual disturbance.  Respiratory:  Positive for cough and shortness of breath.   Cardiovascular:  Negative for chest pain.  Gastrointestinal:  Negative for abdominal pain, diarrhea, nausea and vomiting.  Genitourinary:  Negative for flank pain.  Musculoskeletal:  Negative for back pain and neck pain.  Skin:  Negative for rash and wound.  Neurological:  Negative for weakness.  All other systems reviewed and are negative.   ____________________________________________  PHYSICAL EXAM:      VITAL SIGNS: ED Triage Vitals  Enc Vitals Group     BP 05/24/21 1252 (!) 117/59     Pulse Rate 05/24/21 1252 73     Resp 05/24/21 1252 17     Temp 05/24/21 1252 98.2 F (36.8 C)     Temp Source 05/24/21 1252 Oral     SpO2 05/24/21 1252 96 %     Weight 05/24/21 1253 121 lb 14.6 oz (55.3 kg)     Height 05/24/21 1253  (1.549 m)     Head Circumference --      Peak Flow --      Pain Score 05/24/21 1253 7     Pain Loc --      Pain Edu? --      Excl. in GC? --      Physical Exam Vitals and nursing note reviewed.  Constitutional:      General: She is not in acute distress.    Appearance: She is well-developed.  HENT:     Head: Normocephalic and atraumatic.  Eyes:     Conjunctiva/sclera: Conjunctivae normal.  Cardiovascular:     Rate and Rhythm: Normal rate and regular rhythm.     Heart sounds: Normal heart sounds. No murmur heard.   No friction rub.  Pulmonary:     Effort: Pulmonary effort is normal. No respiratory distress.     Breath sounds: Examination of the right-middle field reveals wheezing. Examination of  the left-middle field reveals wheezing. Examination of the right-lower field reveals wheezing. Examination of the left-lower field reveals wheezing. Wheezing (mild expiratory) present. No rales.  Abdominal:     General: There is no distension.  Palpations: Abdomen is soft.     Tenderness: There is no abdominal tenderness.  Musculoskeletal:     Cervical back: Neck supple.  Skin:    General: Skin is warm.     Capillary Refill: Capillary refill takes less than 2 seconds.  Neurological:     Mental Status: She is alert and oriented to person, place, and time.     Motor: No abnormal muscle tone.      ____________________________________________   LABS (all labs ordered are listed, but only abnormal results are displayed)  Labs Reviewed  LIPASE, BLOOD - Abnormal; Notable for the following components:      Result Value   Lipase 63 (*)    All other components within normal limits  COMPREHENSIVE METABOLIC PANEL - Abnormal; Notable for the following components:   Glucose, Bld 189 (*)    Calcium 8.5 (*)    Total Protein 6.1 (*)    All other components within normal limits  CBC - Abnormal; Notable for the following components:   WBC 12.9 (*)    All other components within normal limits  BRAIN NATRIURETIC PEPTIDE  TROPONIN I (HIGH SENSITIVITY)    ____________________________________________  EKG: Normal sinus rhythm, ventricular rate 71.  PR 100, QRS 86, QTc 439.  No acute ST elevations or depressions.  No acute events of acute ischemia or infarct ________________________________________  RADIOLOGY All imaging, including plain films, CT scans, and ultrasounds, independently reviewed by me, and interpretations confirmed via formal radiology reads.  ED MD interpretation:   Chest x-ray: Clear  Official radiology report(s): DG Chest 2 View  Result Date: 05/24/2021 CLINICAL DATA:  Shortness of breath COPD CHF EXAM: CHEST - 2 VIEW COMPARISON:  05/14/2021 FINDINGS: Cardiomediastinal  silhouette and pulmonary vasculature are within normal limits. Linear opacities at the left lung base favored to be atelectasis. Lungs otherwise clear. IMPRESSION: No acute cardiopulmonary process Electronically Signed   By: Acquanetta Belling M.D.   On: 05/24/2021 14:25    ____________________________________________  PROCEDURES   Procedure(s) performed (including Critical Care):  Procedures  ____________________________________________  INITIAL IMPRESSION / MDM / ASSESSMENT AND PLAN / ED COURSE  As part of my medical decision making, I reviewed the following data within the electronic MEDICAL RECORD NUMBER Nursing notes reviewed and incorporated, Old chart reviewed, Notes from prior ED visits, and [place] Controlled Substance Database       *Tanya Cole was evaluated in Emergency Department on 05/25/2021 for the symptoms described in the history of present illness. She was evaluated in the context of the global COVID-19 pandemic, which necessitated consideration that the patient might be at risk for infection with the SARS-CoV-2 virus that causes COVID-19. Institutional protocols and algorithms that pertain to the evaluation of patients at risk for COVID-19 are in a state of rapid change based on information released by regulatory bodies including the CDC and federal and state organizations. These policies and algorithms were followed during the patient's care in the ED.  Some ED evaluations and interventions may be delayed as a result of limited staffing during the pandemic.*     Medical Decision Making:  87 yo F with PMHx as above here with shortness of breath. Pt was just hospitalized for similar sx, and has been recently seen/evaluated by cardiology and PCP. She is currently on prednisone for recent COPD exacerbation. Suspect ongoing COPD exacerbation, with bronchospasm based on exam. There may also be a component of anxiety as pt felt much better w/ reassurance. CXR today is unremarkable.  Labs  overall very reassuring. CMP unremarkable. CBC with mild leukocytosis. BNP normal. Troponin normal. EKG non ischemic.   Pt feels markedly improved after duonebs, and feels comfortable going home. Given reassuring exam, vitals, ability to ambulate w/o difficulty, in setting of known recent COPD exacerbation with negative cardiac w/u, feel this is reasonable. Will have her increase the frequency of her nebs at home, return as needed.  ____________________________________________  FINAL CLINICAL IMPRESSION(S) / ED DIAGNOSES  Final diagnoses:  Non-intractable vomiting with nausea, unspecified vomiting type  Shortness of breath     MEDICATIONS GIVEN DURING THIS VISIT:  Medications  ipratropium-albuterol (DUONEB) 0.5-2.5 (3) MG/3ML nebulizer solution 3 mL (3 mLs Nebulization Given 05/24/21 2043)  ipratropium-albuterol (DUONEB) 0.5-2.5 (3) MG/3ML nebulizer solution 3 mL (3 mLs Nebulization Given 05/24/21 2043)     ED Discharge Orders          Ordered    ondansetron (ZOFRAN ODT) 4 MG disintegrating tablet  Every 8 hours PRN        05/24/21 2259             Note:  This document was prepared using Dragon voice recognition software and may include unintentional dictation errors.   Shaune Pollack, MD 05/25/21 1001

## 2021-05-24 NOTE — Discharge Instructions (Addendum)
Use the ALBUTEROL inhaler/nebulizer every 4 hours when awake for the next 24 hours, then every 4 hours as needed  Continue your other medications  Call your primary doctor for follow-up

## 2021-05-25 ENCOUNTER — Telehealth: Payer: Self-pay

## 2021-05-25 NOTE — Telephone Encounter (Signed)
Received a call from [place] from [facility] asking fo verbal orders to increase to once weekly for 9 weeks. Tresa Endo wants a call back to [phone]

## 2021-05-25 NOTE — Telephone Encounter (Signed)
Verbals given  

## 2021-05-25 NOTE — Telephone Encounter (Signed)
Ok

## 2021-05-26 ENCOUNTER — Telehealth: Payer: Self-pay

## 2021-05-26 ENCOUNTER — Telehealth: Payer: Self-pay | Admitting: Internal Medicine

## 2021-05-26 DIAGNOSIS — D72829 Elevated white blood cell count, unspecified: Secondary | ICD-10-CM

## 2021-05-26 NOTE — Telephone Encounter (Signed)
CBC has been ordered for future lab draw.

## 2021-05-26 NOTE — Telephone Encounter (Signed)
Pt daughter called she stated that her mother was seen in the ED on 05/24/21 for her COPD. She is wanting to schedule an ED follow up for a Friday no appt available

## 2021-05-27 NOTE — Telephone Encounter (Signed)
Need to know how she is doing.  Better?  Symptoms?

## 2021-05-27 NOTE — Telephone Encounter (Signed)
Please advise when you would like to patient?

## 2021-05-28 DIAGNOSIS — E559 Vitamin D deficiency, unspecified: Secondary | ICD-10-CM | POA: Diagnosis not present

## 2021-05-28 DIAGNOSIS — M810 Age-related osteoporosis without current pathological fracture: Secondary | ICD-10-CM | POA: Diagnosis not present

## 2021-05-28 NOTE — Telephone Encounter (Signed)
Can schedule 12:30 06/09/21.

## 2021-05-28 NOTE — Telephone Encounter (Signed)
Pt stated she feeling "okay" mostly tired. She said that she was better though & doing her daily nebulizer treatments. She did feel like her breathing was better overall.

## 2021-05-28 NOTE — Telephone Encounter (Signed)
I called patient & she was agreeable to be seen 8/10 @ 12:30p. Pt is scheduled.

## 2021-05-31 DIAGNOSIS — J441 Chronic obstructive pulmonary disease with (acute) exacerbation: Secondary | ICD-10-CM | POA: Diagnosis not present

## 2021-06-01 ENCOUNTER — Encounter: Payer: Self-pay | Admitting: Internal Medicine

## 2021-06-01 ENCOUNTER — Telehealth: Payer: Self-pay

## 2021-06-01 DIAGNOSIS — I2510 Atherosclerotic heart disease of native coronary artery without angina pectoris: Secondary | ICD-10-CM | POA: Diagnosis not present

## 2021-06-01 DIAGNOSIS — G8929 Other chronic pain: Secondary | ICD-10-CM | POA: Diagnosis not present

## 2021-06-01 DIAGNOSIS — E7800 Pure hypercholesterolemia, unspecified: Secondary | ICD-10-CM | POA: Diagnosis not present

## 2021-06-01 DIAGNOSIS — I5032 Chronic diastolic (congestive) heart failure: Secondary | ICD-10-CM | POA: Diagnosis not present

## 2021-06-01 DIAGNOSIS — J441 Chronic obstructive pulmonary disease with (acute) exacerbation: Secondary | ICD-10-CM | POA: Diagnosis not present

## 2021-06-01 DIAGNOSIS — Z7984 Long term (current) use of oral hypoglycemic drugs: Secondary | ICD-10-CM | POA: Diagnosis not present

## 2021-06-01 DIAGNOSIS — I110 Hypertensive heart disease with heart failure: Secondary | ICD-10-CM | POA: Diagnosis not present

## 2021-06-01 DIAGNOSIS — M25562 Pain in left knee: Secondary | ICD-10-CM | POA: Diagnosis not present

## 2021-06-01 DIAGNOSIS — J9621 Acute and chronic respiratory failure with hypoxia: Secondary | ICD-10-CM | POA: Diagnosis not present

## 2021-06-01 NOTE — Chronic Care Management (AMB) (Signed)
  Chronic Care Management   Note  06/01/2021 Name: Tanya Cole MRN: [id number] DOB: November 28, 1932  Tanya Cole is a 87 y.o. year old female who is a primary care patient of Einar Pheasant, MD. I reached out to [place] by phone today in response to a referral sent by Ms. [place] Mae Ciolek's PCP, Einar Pheasant, MD      [name] was given information about Chronic Care Management services today including:  CCM service includes personalized support from designated clinical staff supervised by her physician, including individualized plan of care and coordination with other care providers 24/7 contact phone numbers for assistance for urgent and routine care needs. Service will only be billed when office clinical staff spend 20 minutes or more in a month to coordinate care. Only one practitioner may furnish and bill the service in a calendar month. The patient may stop CCM services at any time (effective at the end of the month) by phone call to the office staff. The patient will be responsible for cost sharing (co-pay) of up to 20% of the service fee (after annual deductible is met).  Patient agreed to services and verbal consent obtained.   Follow up plan: Telephone appointment with care management team member scheduled for:06/04/2021  Noreene Larsson, [address], [place], [place] [address] Direct Dial: [phone] [email or web address] Website: [email or web address]

## 2021-06-02 ENCOUNTER — Telehealth: Payer: Self-pay | Admitting: Internal Medicine

## 2021-06-02 DIAGNOSIS — R457 State of emotional shock and stress, unspecified: Secondary | ICD-10-CM | POA: Diagnosis not present

## 2021-06-02 DIAGNOSIS — I959 Hypotension, unspecified: Secondary | ICD-10-CM | POA: Diagnosis not present

## 2021-06-02 NOTE — Telephone Encounter (Signed)
Called Ms Smola to check on her.  EMS called.  Did not feel needed to go to ER.  Last neb 4:00.  She does feel she is breathing better now and does not feel needs to be seen.  Will do another neb now.  Please call for update and confirm pt doing ok.  She will call or be evaluated if symptoms change.

## 2021-06-02 NOTE — Telephone Encounter (Signed)
Transferred to Access Nurse  Pt was transferred from [facility] to be triaged  Pt is having SOB also has COPD

## 2021-06-02 NOTE — Telephone Encounter (Signed)
Access nurse stated EMS was on its way to patient.

## 2021-06-03 NOTE — Telephone Encounter (Signed)
Patient has already been notified.

## 2021-06-03 NOTE — Telephone Encounter (Signed)
Noted.  Have her f/u with [place] and if any change or worsening symptoms, she needs to be evaluated.  Any concerns, needs to be seen.

## 2021-06-03 NOTE — Telephone Encounter (Signed)
Patient stated she feels better this morning, but she hasn't been up for long so that may change per patient.

## 2021-06-04 ENCOUNTER — Ambulatory Visit (INDEPENDENT_AMBULATORY_CARE_PROVIDER_SITE_OTHER): Payer: Medicare HMO | Admitting: *Deleted

## 2021-06-04 DIAGNOSIS — I5032 Chronic diastolic (congestive) heart failure: Secondary | ICD-10-CM

## 2021-06-04 DIAGNOSIS — J441 Chronic obstructive pulmonary disease with (acute) exacerbation: Secondary | ICD-10-CM

## 2021-06-04 NOTE — Chronic Care Management (AMB) (Signed)
Chronic Care Management   CCM RN Visit Note  06/04/2021 Name: Tanya Cole MRN: [id number] DOB: 09-10-1933  Subjective: Tanya Cole is a 87 y.o. year old female who is a primary care patient of Einar Pheasant, MD. The care management team was consulted for assistance with disease management and care coordination needs.    Engaged with patient by telephone for initial visit in response to provider referral for case management and/or care coordination services.   Consent to Services:  The patient was given the following information about Chronic Care Management services today, agreed to services, and gave verbal consent: 1. CCM service includes personalized support from designated clinical staff supervised by the primary care provider, including individualized plan of care and coordination with other care providers 2. 24/7 contact phone numbers for assistance for urgent and routine care needs. 3. Service will only be billed when office clinical staff spend 20 minutes or more in a month to coordinate care. 4. Only one practitioner may furnish and bill the service in a calendar month. 5.The patient may stop CCM services at any time (effective at the end of the month) by phone call to the office staff. 6. The patient will be responsible for cost sharing (co-pay) of up to 20% of the service fee (after annual deductible is met). Patient agreed to services and consent obtained.  Patient agreed to services and verbal consent obtained.   Assessment: Review of patient past medical history, allergies, medications, health status, including review of consultants reports, laboratory and other test data, was performed as part of comprehensive evaluation and provision of chronic care management services.   SDOH (Social Determinants of Health) assessments and interventions performed:  SDOH Interventions    Flowsheet Row Most Recent Value  SDOH Interventions   Food Insecurity Interventions  Intervention Not Indicated  Financial Strain Interventions Intervention Not Indicated  Housing Interventions Intervention Not Indicated  Intimate Partner Violence Interventions Intervention Not Indicated  Transportation Interventions Intervention Not Indicated        CCM Care Plan  Allergies  Allergen Reactions   Tramadol Itching and Nausea And Vomiting    Outpatient Encounter Medications as of 06/04/2021  Medication Sig   ADVAIR DISKUS 250-50 MCG/DOSE AEPB INHALE ONE PUFF BY MOUTH EVERY 12 HOURS. RINSE MOUTH AFTER EACH USE   alendronate (FOSAMAX) 70 MG tablet Take 70 mg by mouth once a week.    aspirin 81 MG tablet Take 81 mg by mouth daily.   atorvastatin (LIPITOR) 80 MG tablet TAKE 1 TABLET BY MOUTH ONCE DAILY AT  6PM   dextromethorphan-guaiFENesin (MUCINEX DM) 30-600 MG 12hr tablet Take 1 tablet by mouth 2 (two) times daily as needed for cough.   fluticasone (FLONASE) 50 MCG/ACT nasal spray Use 2 spray(s) in each nostril once daily   ipratropium-albuterol (DUONEB) 0.5-2.5 (3) MG/3ML SOLN Take 3 mLs by nebulization 2 (two) times daily.   lisinopril (ZESTRIL) 20 MG tablet Take 1 tablet by mouth once daily   metFORMIN (GLUCOPHAGE) 500 MG tablet TAKE 1 TABLET BY MOUTH TWICE DAILY WITH A MEAL   metoprolol tartrate (LOPRESSOR) 25 MG tablet Take 1 tablet by mouth twice daily   nitroGLYCERIN (NITROSTAT) 0.4 MG SL tablet Place 1 tablet (0.4 mg total) under the tongue every 5 (five) minutes as needed for chest pain.   ondansetron (ZOFRAN ODT) 4 MG disintegrating tablet Take 1 tablet (4 mg total) by mouth every 8 (eight) hours as needed for nausea or vomiting.   PROAIR HFA 108 (90  Base) MCG/ACT inhaler Inhale 2 puffs into the lungs every 4 (four) hours as needed for wheezing or shortness of breath.   Vitamin D, Ergocalciferol, (DRISDOL) 50000 units CAPS capsule Take 50,000 Units by mouth once a week.   No facility-administered encounter medications on file as of 06/04/2021.    Patient Active  Problem List   Diagnosis Date Noted   COPD (chronic obstructive pulmonary disease) ([place]) 05/21/2021   Hypomagnesemia 05/14/2021   CAD (coronary artery disease)    Severe sepsis (HCC)    Hypokalemia    Acute on chronic respiratory failure with hypoxia (HCC)    Asthma exacerbation    Joint ache 04/09/2021   Calcified granuloma of lung ([place]) 03/02/2021   Acute exacerbation of chronic obstructive pulmonary disease (COPD) ([place]) 03/01/2021   COPD exacerbation ([place]) 11/29/2020   Chronic diastolic CHF (congestive heart failure) ([place]) 11/29/2020   CAP (community acquired pneumonia) 11/29/2020   Rib pain on right side 10/19/2020   Abnormal CXR 10/19/2020   Type 2 diabetes mellitus with cardiac complication ([place]) 57/84/6962   Elevated troponin 10/08/2020   Acute on chronic heart failure with preserved ejection fraction (HFpEF) ([place]) 10/08/2020   COPD with acute exacerbation ([place]) 08/19/2020   History of non-ST elevation myocardial infarction (NSTEMI) 08/07/2020   Asthma 08/07/2020   Leukocytosis 08/07/2020   Left shoulder pain 07/12/2020   Iron deficiency 04/16/2020   Anemia 04/04/2020   SOB (shortness of breath) 03/25/2020   Cough 09/30/2019   Gallstone pancreatitis    Pre-op evaluation 08/30/2019   Cholecystitis 08/05/2019   Choledocholithiasis    Respiratory illness 06/18/2019   Coronary artery disease of native heart with stable angina pectoris ([place]) 12/29/2018   Chest pain 10/07/2018   Memory change 05/27/2018   Osteoporosis 02/05/2018   Weight loss 06/24/2017   Abdominal pain, left lower quadrant 10/05/2016   Long term current use of systemic steroids 05/16/2016   Elevated erythrocyte sedimentation rate 05/04/2016   Back pain 04/29/2016   Fatigue 05/24/2015   Health care maintenance 01/25/2015   UTI (urinary tract infection) 07/07/2014   Neuropathy 03/24/2014   Diverticulitis 02/24/2013   Reactive airway disease 09/15/2012   Hypertension 09/14/2012   Hypercholesterolemia  09/14/2012   Diabetes mellitus with cardiac complication ([place]) 95/28/4132    Conditions to be addressed/monitored:CHF and COPD  Care Plan : [facility] Care Plan  Updates made by Leona Singleton, RN since 06/04/2021 12:00 AM     Problem: Patient with increase ER and hospitalizations related to shortness of breath COPD exacerbations   Priority: High     Long-Range Goal: Patient will report no emergency room visits within the next 90 days   Start Date: 06/04/2021  Expected End Date: 12/28/2021  Priority: High  Note:   Current Barriers:  Knowledge Deficits related to plan of care for management of CHF and COPD  Chronic Disease Management support and education needs related to CHF and COPD; Patient reporting increase in emergency room visits related to shortness of breath;  Denies any increase in shortness of breath at this time.  Reports compliance with all medications, including inhalers and nebulizer.  States she has had to use her rescue inhaler about 2 times in the past week.  Does not weigh at home but agrees to begin to do so.  Has not seen, Dr. Raul Del Pulmonologist in follow up in years per patient and her children are requesting she arrange an appointment.  RNCM Clinical Goal(s):  Patient will verbalize understanding of plan for management  of CHF and COPD take all medications exactly as prescribed and will call provider for medication related questions demonstrate a decrease in CHF and COPD exacerbations requiring emergency room visits or hospitalizations verbalize basic understanding of CHF and COPD disease process and self health management plan to manage conditions experience decrease in ED visits. ED visits in last 6 months = 7 (2 admissions)  through collaboration with RN Care manager, provider, and care team.   Interventions: 1:1 collaboration with primary care provider regarding development and update of comprehensive plan of care as evidenced by provider attestation and  co-signature Inter-disciplinary care team collaboration (see longitudinal plan of care) Evaluation of current treatment plan related to  self management and patient's adherence to plan as established by provider;  COPD: (Status: New goal.) Provided patient with basic written and verbal COPD education on self care/management/and exacerbation prevention; Advised patient to track and manage COPD triggers;  Provided instruction about proper use of medications used for management of COPD including inhalers; Advised patient to self assesses COPD action plan zone and make appointment with provider if in the yellow zone for 48 hours without improvement; Discussed the importance of adequate rest and management of fatigue with COPD; Encouraged patient to use nebulizer if rescue inhaler not helping shortness of breath Discussed contacting providers for shortness of breath prior to needing to call EMS Discussed and encouraged patent to contact Dr. Raul Del for possible follow up appointment  Heart Failure Interventions:  (Status: New goal.) Basic overview and discussion of pathophysiology of Heart Failure reviewed; Provided education on low sodium diet; Reviewed Heart Failure Action Plan in depth and provided written copy; Assessed need for readable accurate scales in home; Discussed importance of daily weight and advised patient to weigh and record daily; Discussed the importance of keeping all appointments with provider; Screening for signs and symptoms of depression related to chronic disease state;  Assessed social determinant of health barriers;   Patient Goals/Self-Care Activities: Patient will self administer medications as prescribed Patient will attend all scheduled provider appointments Patient will continue to perform ADL's independently Develop a rescue plan & follow rescue plan if symptoms flare-up Eliminate symptom triggers at home Consider using nebulizer for increase in shortness of  breath Contact provider for increase shortness of breath and increased need for rescue inhaler  Keep follow-up appointments; Consider contacting Dr. Raul Del with Pulmonology for follow up appointment Weigh self daily and keep in log for provider review Notify if you gain 2-3 pounds overnight of 5 pounds in 5 days Develop a rescue plan & follow rescue plan if symptoms flare-up Know when to call the doctor Low salt and low carbohydrate diabetic diet      Plan:The care management team will reach out to the patient again over the next 20 business days.  Hubert Azure RN, MSN RN Care Management Coordinator [place] [phone] [email or web address]

## 2021-06-04 NOTE — Patient Instructions (Signed)
Visit Information   PATIENT GOALS:   Goals Addressed             This Visit's Progress    (RNCM)  Track and Manage Symptoms-Heart Failure       Timeframe:  Long-Range Goal Priority:  Medium Start Date:    06/04/21                         Expected End Date:   12/28/21                   Barriers: Knowledge  Follow Up Date 06/16/21    Weigh self daily and keep in log for provider review Notify if you gain 2-3 pounds overnight of 5 pounds in 5 days Develop a rescue plan & follow rescue plan if symptoms flare-up Know when to call the doctor Low salt and low carbohydrate diabetic diet     Why is this important?   You will be able to handle your symptoms better if you keep track of them.  Making some simple changes to your lifestyle will help.  Eating healthy is one thing you can do to take good care of yourself.    Notes:      COMPLETED: Track and Manage Fluids and Swelling-Heart Failure       Track and Manage My Symptoms-COPD       Timeframe:  Long-Range Goal Priority:  High Start Date:   06/04/21                          Expected End Date:   12/28/21                   Barriers: Knowledge  Follow Up Date 06/16/21    Develop a rescue plan & follow rescue plan if symptoms flare-up Eliminate symptom triggers at home Consider using nebulizer for increase in shortness of breath Contact provider for increase shortness of breath and increased need for rescue inhaler  Keep follow-up appointments; Consider contacting Dr. Raul Cole with Pulmonology for follow up appointment   Why is this important?   Tracking your symptoms and other information about your health helps your doctor plan your care.  Write down the symptoms, the time of day, what you were doing and what medicine you are taking.  You will soon learn how to manage your symptoms.     Notes:         Consent to CCM Services: Tanya Cole was given information about Chronic Care Management services including:  CCM service  includes personalized support from designated clinical staff supervised by her physician, including individualized plan of care and coordination with other care providers 24/7 contact phone numbers for assistance for urgent and routine care needs. Service will only be billed when office clinical staff spend 20 minutes or more in a month to coordinate care. Only one practitioner may furnish and bill the service in a calendar month. The patient may stop CCM services at any time (effective at the end of the month) by phone call to the office staff. The patient will be responsible for cost sharing (co-pay) of up to 20% of the service fee (after annual deductible is met).  Patient agreed to services and verbal consent obtained.   The patient verbalized understanding of instructions, educational materials, and care plan provided today and agreed to receive a mailed copy of patient instructions, educational materials, and care  plan.   The care management team will reach out to the patient again over the next 20 business days.   Tanya Azure RN, MSN RN Care Management Coordinator [place] [phone] [email or web address]   CLINICAL CARE PLAN: Patient Care Plan: [facility] Care Plan     Problem Identified: Patient with increase ER and hospitalizations related to shortness of breath COPD exacerbations   Priority: High     Long-Range Goal: Patient will report no emergency room visits within the next 90 days   Start Date: 06/04/2021  Expected End Date: 12/28/2021  Priority: High  Note:   Current Barriers:  Knowledge Deficits related to plan of care for management of CHF and COPD  Chronic Disease Management support and education needs related to CHF and COPD; Patient reporting increase in emergency room visits related to shortness of breath;  Denies any increase in shortness of breath at this time.  Reports compliance with all medications, including inhalers and  nebulizer.  States she has had to use her rescue inhaler about 2 times in the past week.  Does not weigh at home but agrees to begin to do so.  Has not seen, Dr. Raul Cole Pulmonologist in follow up in years per patient and her children are requesting she arrange an appointment.  RNCM Clinical Goal(s):  Patient will verbalize understanding of plan for management of CHF and COPD take all medications exactly as prescribed and will call provider for medication related questions demonstrate a decrease in CHF and COPD exacerbations requiring emergency room visits or hospitalizations verbalize basic understanding of CHF and COPD disease process and self health management plan to manage conditions experience decrease in ED visits. ED visits in last 6 months = 7 (2 admissions)  through collaboration with RN Care manager, provider, and care team.   Interventions: 1:1 collaboration with primary care provider regarding development and update of comprehensive plan of care as evidenced by provider attestation and co-signature Inter-disciplinary care team collaboration (see longitudinal plan of care) Evaluation of current treatment plan related to  self management and patient's adherence to plan as established by provider;  COPD: (Status: New goal.) Provided patient with basic written and verbal COPD education on self care/management/and exacerbation prevention; Advised patient to track and manage COPD triggers;  Provided instruction about proper use of medications used for management of COPD including inhalers; Advised patient to self assesses COPD action plan zone and make appointment with provider if in the yellow zone for 48 hours without improvement; Discussed the importance of adequate rest and management of fatigue with COPD; Encouraged patient to use nebulizer if rescue inhaler not helping shortness of breath Discussed contacting providers for shortness of breath prior to needing to call EMS Discussed  and encouraged patent to contact Dr. Raul Cole for possible follow up appointment  Heart Failure Interventions:  (Status: New goal.) Basic overview and discussion of pathophysiology of Heart Failure reviewed; Provided education on low sodium diet; Reviewed Heart Failure Action Plan in depth and provided written copy; Assessed need for readable accurate scales in home; Discussed importance of daily weight and advised patient to weigh and record daily; Discussed the importance of keeping all appointments with provider; Screening for signs and symptoms of depression related to chronic disease state;  Assessed social determinant of health barriers;   Patient Goals/Self-Care Activities: Patient will self administer medications as prescribed Patient will attend all scheduled provider appointments Patient will continue to perform ADL's independently Develop a rescue plan & follow rescue plan if symptoms flare-up  Eliminate symptom triggers at home Consider using nebulizer for increase in shortness of breath Contact provider for increase shortness of breath and increased need for rescue inhaler  Keep follow-up appointments; Consider contacting Dr. Raul Cole with Pulmonology for follow up appointment Weigh self daily and keep in log for provider review Notify if you gain 2-3 pounds overnight of 5 pounds in 5 days Develop a rescue plan & follow rescue plan if symptoms flare-up Know when to call the doctor Low salt and low carbohydrate diabetic diet

## 2021-06-08 DIAGNOSIS — J441 Chronic obstructive pulmonary disease with (acute) exacerbation: Secondary | ICD-10-CM | POA: Diagnosis not present

## 2021-06-08 DIAGNOSIS — I5032 Chronic diastolic (congestive) heart failure: Secondary | ICD-10-CM | POA: Diagnosis not present

## 2021-06-08 DIAGNOSIS — Z7984 Long term (current) use of oral hypoglycemic drugs: Secondary | ICD-10-CM | POA: Diagnosis not present

## 2021-06-08 DIAGNOSIS — G8929 Other chronic pain: Secondary | ICD-10-CM | POA: Diagnosis not present

## 2021-06-08 DIAGNOSIS — E7800 Pure hypercholesterolemia, unspecified: Secondary | ICD-10-CM | POA: Diagnosis not present

## 2021-06-08 DIAGNOSIS — I2510 Atherosclerotic heart disease of native coronary artery without angina pectoris: Secondary | ICD-10-CM | POA: Diagnosis not present

## 2021-06-08 DIAGNOSIS — M25562 Pain in left knee: Secondary | ICD-10-CM | POA: Diagnosis not present

## 2021-06-08 DIAGNOSIS — J9621 Acute and chronic respiratory failure with hypoxia: Secondary | ICD-10-CM | POA: Diagnosis not present

## 2021-06-08 DIAGNOSIS — I110 Hypertensive heart disease with heart failure: Secondary | ICD-10-CM | POA: Diagnosis not present

## 2021-06-09 ENCOUNTER — Other Ambulatory Visit: Payer: Self-pay

## 2021-06-09 ENCOUNTER — Ambulatory Visit: Payer: Medicare HMO | Admitting: Internal Medicine

## 2021-06-09 ENCOUNTER — Ambulatory Visit (INDEPENDENT_AMBULATORY_CARE_PROVIDER_SITE_OTHER): Payer: Medicare HMO | Admitting: Internal Medicine

## 2021-06-09 ENCOUNTER — Telehealth: Payer: Self-pay | Admitting: Internal Medicine

## 2021-06-09 DIAGNOSIS — I25118 Atherosclerotic heart disease of native coronary artery with other forms of angina pectoris: Secondary | ICD-10-CM

## 2021-06-09 DIAGNOSIS — J449 Chronic obstructive pulmonary disease, unspecified: Secondary | ICD-10-CM | POA: Diagnosis not present

## 2021-06-09 DIAGNOSIS — E7800 Pure hypercholesterolemia, unspecified: Secondary | ICD-10-CM | POA: Diagnosis not present

## 2021-06-09 DIAGNOSIS — I5032 Chronic diastolic (congestive) heart failure: Secondary | ICD-10-CM

## 2021-06-09 DIAGNOSIS — I10 Essential (primary) hypertension: Secondary | ICD-10-CM | POA: Diagnosis not present

## 2021-06-09 DIAGNOSIS — E1159 Type 2 diabetes mellitus with other circulatory complications: Secondary | ICD-10-CM | POA: Diagnosis not present

## 2021-06-09 DIAGNOSIS — D72829 Elevated white blood cell count, unspecified: Secondary | ICD-10-CM

## 2021-06-09 MED ORDER — FLUTICASONE-SALMETEROL 250-50 MCG/ACT IN AEPB: 1.0000 | INHALATION_SPRAY | Freq: Two times a day (BID) | RESPIRATORY_TRACT | 5 refills | Status: DC

## 2021-06-09 NOTE — Telephone Encounter (Signed)
Ok to refill 

## 2021-06-09 NOTE — Telephone Encounter (Signed)
Patients daughter called in to request a refill on her ADVAIR DISKUS 250-50 MCG/DOSE AEPB that she takes twice a day.

## 2021-06-09 NOTE — Telephone Encounter (Signed)
I am unable to send in refill due to epic hard stopping me. Please advise.

## 2021-06-09 NOTE — Progress Notes (Signed)
[name], female   DOB: 1933-05-28, 87 y.o.   MRN: [id number]   Subjective:    [name], female    DOB: 1933/04/12, 87 y.o.   MRN: [id number]  HPI This visit occurred during the SARS-CoV-2 public health emergency.  Safety protocols were in place, including screening questions prior to the visit, additional usage of staff PPE, and extensive cleaning of exam room while observing appropriate contact time as indicated for disinfecting solutions.   Patient here for  ER follow up.  Has been seen in ER on several visits recently for sob and chest pain.  Last seen ER 05/24/21 - for sob and nausea.  CXR ok.  BNP normal.  Troponin normal.  Was given duonebs.  Discharged.  States she has been doing ok since discharge.  Breathing has been stable.  No chest pain.  Lives by herself.  Able to get around and do her ADLs.  Eating.  No nausea or vomiting.  Bowels moving.  Discussed anxiety.  She declines.  Does not feel she needs to be on anything for anxiety and her daughter agrees.   Past Medical History:  Diagnosis Date   (HFpEF) heart failure with preserved ejection fraction (HCC)    a. TTE 12/19: EF 55-60%, probable HK of the mid apical anterior septal myocardium, Gr1DD, mild AI, mildly dilated LA; b.07/2020 Echo: EF 60-65%, no rwma, Gr2 DD. Nl RV fxn. Mildly dil LA. Mild MR.   Asthma    CAD (coronary artery disease)    a. 09/2018 NSTEMI/PCI: LM min irregs, mLAD 95 (PCI/DES), mLAD-2 60%, LCx mild diff dzs, RCA min irregs; b. 03/2020 MV: EF>65%, no ischemia/scar; c. 07/2020 Cath: LM min irregs, LAD 30p, 23m, 90d, D1/2 min irregs, LCX diff dzs throughout, OM1/2/3 mild dzs, RCA 30p, RPDA/RPAV min irrges. EF 55-65%.   CHF (congestive heart failure) (HCC)    Diabetes mellitus (HCC)    Hypercholesterolemia    Hypertension    Myocardial infarction (HCC)    Osteopenia    Palpitations    a. 04/2020 [facility]: Avg HR 75. 429 SVT episodes, longest 19 secs @ 133. Occas PACs (3.2%).  Rare PVCs (<1%).   Polymyalgia rheumatica syndrome (HCC)    Reactive airway disease    Past Surgical History:  Procedure Laterality Date   ABDOMINAL HYSTERECTOMY  1981   prolapse and bleeding, ovaries not removed   BREAST EXCISIONAL BIOPSY Right    CHOLECYSTECTOMY N/A 09/02/2019   Procedure: LAPAROSCOPIC CHOLECYSTECTOMY WITH INTRAOPERATIVE CHOLANGIOGRAM;  Surgeon: Henrene Dodge, MD;  Location: [facility] ORS;  Service: General;  Laterality: N/A;   CORONARY STENT INTERVENTION N/A 10/08/2018   Procedure: CORONARY STENT INTERVENTION;  Surgeon: Iran Ouch, MD;  Location: [facility];  Service: Cardiovascular;  Laterality: N/A;   ENDOSCOPIC RETROGRADE CHOLANGIOPANCREATOGRAPHY (ERCP) WITH PROPOFOL N/A 08/08/2019   Procedure: ENDOSCOPIC RETROGRADE CHOLANGIOPANCREATOGRAPHY (ERCP) WITH PROPOFOL;  Surgeon: Midge Minium, MD;  Location: [facility];  Service: Endoscopy;  Laterality: N/A;   LEFT HEART CATH AND CORONARY ANGIOGRAPHY N/A 10/08/2018   Procedure: LEFT HEART CATH AND CORONARY ANGIOGRAPHY;  Surgeon: Iran Ouch, MD;  Location: [facility];  Service: Cardiovascular;  Laterality: N/A;   LEFT HEART CATH AND CORONARY ANGIOGRAPHY N/A 08/10/2020   Procedure: LEFT HEART CATH AND CORONARY ANGIOGRAPHY possible percutaneous intervention;  Surgeon: Iran Ouch, MD;  Location: [facility];  Service: Cardiovascular;  Laterality: N/A;   UMBILICAL HERNIA REPAIR  7/94   Family History  Problem Relation Age of Onset   Arthritis Mother    Heart disease Mother    Heart attack Father    Throat cancer Sister    Parkinson's disease Sister    COPD Brother    Social History   Socioeconomic History   Marital status: Widowed    Spouse name: Not on file   Number of children: 3   Years of education: Not on file   Highest education level: Not on file  Occupational History   Not on file  Tobacco Use   Smoking status: Never   Smokeless tobacco: Never  Vaping Use    Vaping Use: Never used  Substance and Sexual Activity   Alcohol use: No    Alcohol/week: 0.0 standard drinks   Drug use: No   Sexual activity: Not Currently  Other Topics Concern   Not on file  Social History Narrative   No smoking; no alcohol; in [place]; worked in Designer, fashion/clothing. Lives by self in [place]. Does all of her own housework. Dtr does food shopping for her.   Social Determinants of Health   Financial Resource Strain: Low Risk    Difficulty of Paying Living Expenses: Not hard at all  Food Insecurity: No Food Insecurity   Worried About Programme researcher, broadcasting/film/video in the Last Year: Never true   Ran Out of Food in the Last Year: Never true  Transportation Needs: No Transportation Needs   Lack of Transportation (Medical): No   Lack of Transportation (Non-Medical): No  Physical Activity: Not on file  Stress: No Stress Concern Present   Feeling of Stress : Not at all  Social Connections: Moderately Integrated   Frequency of Communication with Friends and Family: More than three times a week   Frequency of Social Gatherings with Friends and Family: More than three times a week   Attends Religious Services: More than 4 times per year   Active Member of [employer] or Organizations: Yes   Attends Banker Meetings: Not on file   Marital Status: Widowed     Review of Systems  Constitutional:  Negative for appetite change and unexpected weight change.  HENT:  Negative for congestion and sinus pressure.   Respiratory:  Negative for cough and chest tightness.        Breathing stable.   Cardiovascular:  Negative for chest pain, palpitations and leg swelling.  Gastrointestinal:  Negative for abdominal pain, diarrhea, nausea and vomiting.  Genitourinary:  Negative for difficulty urinating and dysuria.  Musculoskeletal:  Negative for joint swelling and myalgias.  Skin:  Negative for color change and rash.  Neurological:  Negative for dizziness, light-headedness and headaches.   Psychiatric/Behavioral:  Negative for agitation and dysphoric mood.       Objective:    Physical Exam Vitals reviewed.  Constitutional:      General: She is not in acute distress.    Appearance: Normal appearance.  HENT:     Head: Normocephalic and atraumatic.     Right Ear: External ear normal.     Left Ear: External ear normal.  Eyes:     General: No scleral icterus.       Right eye: No discharge.        Left eye: No discharge.     Conjunctiva/sclera: Conjunctivae normal.  Neck:     Thyroid: No thyromegaly.  Cardiovascular:     Rate and Rhythm: Normal rate and regular rhythm.  Pulmonary:     Effort: No respiratory distress.  Breath sounds: Normal breath sounds. No wheezing.  Abdominal:     General: Bowel sounds are normal.     Palpations: Abdomen is soft.     Tenderness: There is no abdominal tenderness.  Musculoskeletal:        General: No swelling or tenderness.     Cervical back: Neck supple. No tenderness.  Lymphadenopathy:     Cervical: No cervical adenopathy.  Skin:    Findings: No erythema or rash.  Neurological:     Mental Status: She is alert.  Psychiatric:        Mood and Affect: Mood normal.        Behavior: Behavior normal.    BP 128/68   Pulse 70   Temp (!) 97.1 F (36.2 C) (Skin)   Ht 5\' 1"  (1.549 m)   Wt 123 lb 3.2 oz (55.9 kg)   SpO2 96%   BMI 23.28 kg/m  Wt Readings from Last 3 Encounters:  06/09/21 123 lb 3.2 oz (55.9 kg)  05/24/21 121 lb 14.6 oz (55.3 kg)  05/21/21 122 lb (55.3 kg)    Outpatient Encounter Medications as of 06/09/2021  Medication Sig   alendronate (FOSAMAX) 70 MG tablet Take 70 mg by mouth once a week.    aspirin 81 MG tablet Take 81 mg by mouth daily.   atorvastatin (LIPITOR) 80 MG tablet TAKE 1 TABLET BY MOUTH ONCE DAILY AT  6PM   dextromethorphan-guaiFENesin (MUCINEX DM) 30-600 MG 12hr tablet Take 1 tablet by mouth 2 (two) times daily as needed for cough.   fluticasone (FLONASE) 50 MCG/ACT nasal spray Use 2  spray(s) in each nostril once daily   ipratropium-albuterol (DUONEB) 0.5-2.5 (3) MG/3ML SOLN Take 3 mLs by nebulization 2 (two) times daily.   lisinopril (ZESTRIL) 20 MG tablet Take 1 tablet by mouth once daily   metFORMIN (GLUCOPHAGE) 500 MG tablet TAKE 1 TABLET BY MOUTH TWICE DAILY WITH A MEAL   metoprolol tartrate (LOPRESSOR) 25 MG tablet Take 1 tablet by mouth twice daily   nitroGLYCERIN (NITROSTAT) 0.4 MG SL tablet Place 1 tablet (0.4 mg total) under the tongue every 5 (five) minutes as needed for chest pain.   ondansetron (ZOFRAN ODT) 4 MG disintegrating tablet Take 1 tablet (4 mg total) by mouth every 8 (eight) hours as needed for nausea or vomiting.   PROAIR HFA 108 (90 Base) MCG/ACT inhaler Inhale 2 puffs into the lungs every 4 (four) hours as needed for wheezing or shortness of breath.   Vitamin D, Ergocalciferol, (DRISDOL) 50000 units CAPS capsule Take 50,000 Units by mouth once a week.   [DISCONTINUED] ADVAIR DISKUS 250-50 MCG/DOSE AEPB INHALE ONE PUFF BY MOUTH EVERY 12 HOURS. RINSE MOUTH AFTER EACH USE   No facility-administered encounter medications on file as of 06/09/2021.     Lab Results  Component Value Date   WBC 12.9 (H) 05/24/2021   HGB 14.7 05/24/2021   HCT 43.2 05/24/2021   PLT 272 05/24/2021   GLUCOSE 189 (H) 05/24/2021   CHOL 207 (H) 03/31/2021   TRIG 192.0 (H) 03/31/2021   HDL 48.50 03/31/2021   LDLDIRECT 136.0 07/03/2020   LDLCALC 120 (H) 03/31/2021   ALT 19 05/24/2021   AST 24 05/24/2021   NA 138 05/24/2021   K 3.6 05/24/2021   CL 106 05/24/2021   CREATININE 0.70 05/24/2021   BUN 20 05/24/2021   CO2 25 05/24/2021   TSH 3.56 03/31/2021   INR 1.1 08/07/2020   HGBA1C 7.3 (H) 03/31/2021   MICROALBUR  3.1 (H) 07/03/2020    DG Chest 2 View  Result Date: 05/24/2021 CLINICAL DATA:  Shortness of breath COPD CHF EXAM: CHEST - 2 VIEW COMPARISON:  05/14/2021 FINDINGS: Cardiomediastinal silhouette and pulmonary vasculature are within normal limits. Linear  opacities at the left lung base favored to be atelectasis. Lungs otherwise clear. IMPRESSION: No acute cardiopulmonary process Electronically Signed   By: Acquanetta Belling M.D.   On: 05/24/2021 14:25       Assessment & Plan:   Problem List Items Addressed This Visit     Chronic diastolic CHF (congestive heart failure) (HCC)    Continue lisinopril, imdur and metoprolol.  Breathing at baseline now.  Follow.       COPD (chronic obstructive pulmonary disease) (HCC)    Recently evaluated in ER.  Given duonebs. On advair.  Just uses prn.  Increase to bid.  Has rescue inhaler if needed.  Discussed need for pulmonary f/u.  Schedule f/u appt with Dr Meredeth Ide - has seen her previously.       Relevant Orders   Ambulatory referral to Pulmonology   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)    Known CAD s/p PCI/DES - mid LAD.  Continue metoprolol, imdur and lisinopril.  No chest pain.  Breathing at baseline.  Follow.       Hypercholesterolemia    Continue lipitor.  Low cholesterol diet and exercise.  Follow lipid panel and liver function tests.        Hypertension    Continue lisinopril, imdur and metoprolol.  Blood pressure improved on recheck.  Follow pressures.  Follow metabolic panel.       Leukocytosis    Follow cbc.       Type 2 diabetes mellitus with cardiac complication (HCC)    Low carb diet and exercise as tolerated.  Follow sugars.  Prednisone - elevation of sugars.  Follow metb and a1c.         Dale [place], MD

## 2021-06-09 NOTE — Progress Notes (Signed)
Pre visit review using our clinic review tool, if applicable. No additional management support is needed unless otherwise documented below in the visit note. 

## 2021-06-10 ENCOUNTER — Other Ambulatory Visit: Payer: Medicare HMO

## 2021-06-14 ENCOUNTER — Encounter: Payer: Self-pay | Admitting: Internal Medicine

## 2021-06-14 NOTE — Assessment & Plan Note (Signed)
Follow cbc.  

## 2021-06-14 NOTE — Assessment & Plan Note (Signed)
Continue lisinopril, imdur and metoprolol.  Blood pressure improved on recheck.  Follow pressures.  Follow metabolic panel.

## 2021-06-14 NOTE — Assessment & Plan Note (Signed)
Continue lisinopril, imdur and metoprolol.  Breathing at baseline now.  Follow.  

## 2021-06-14 NOTE — Assessment & Plan Note (Signed)
Low carb diet and exercise as tolerated.  Follow sugars.  Prednisone - elevation of sugars.  Follow metb and a1c.

## 2021-06-14 NOTE — Assessment & Plan Note (Signed)
Continue lipitor.  Low cholesterol diet and exercise.  Follow lipid panel and liver function tests.   

## 2021-06-14 NOTE — Assessment & Plan Note (Signed)
Recently evaluated in ER.  Given duonebs. On advair.  Just uses prn.  Increase to bid.  Has rescue inhaler if needed.  Discussed need for pulmonary f/u.  Schedule f/u appt with Dr Meredeth Ide - has seen her previously.

## 2021-06-14 NOTE — Assessment & Plan Note (Signed)
Known CAD s/p PCI/DES - mid LAD.  Continue metoprolol, imdur and lisinopril.  No chest pain.  Breathing at baseline.  Follow.  

## 2021-06-16 ENCOUNTER — Ambulatory Visit: Payer: Medicare HMO | Admitting: *Deleted

## 2021-06-16 DIAGNOSIS — E1159 Type 2 diabetes mellitus with other circulatory complications: Secondary | ICD-10-CM

## 2021-06-16 DIAGNOSIS — J449 Chronic obstructive pulmonary disease, unspecified: Secondary | ICD-10-CM

## 2021-06-16 DIAGNOSIS — I5032 Chronic diastolic (congestive) heart failure: Secondary | ICD-10-CM

## 2021-06-16 NOTE — Patient Instructions (Signed)
Visit Information  PATIENT GOALS:  Goals Addressed             This Visit's Progress    (RNCM)  Track and Manage My Symptoms-COPD   On track    Timeframe:  Long-Range Goal Priority:  High Start Date:   06/04/21                          Expected End Date:   12/28/21                   Barriers: Knowledge  Follow Up Date 06/30/21    Develop a rescue plan & follow rescue plan if symptoms flare-up Eliminate symptom triggers at home Consider using nebulizer for increase in shortness of breath Contact provider for increase shortness of breath and increased need for rescue inhaler  Keep follow-up appointments Dr. Meredeth Ide with Pulmonology should be contacting for follow up appointment   Why is this important?   Tracking your symptoms and other information about your health helps your doctor plan your care.  Write down the symptoms, the time of day, what you were doing and what medicine you are taking.  You will soon learn how to manage your symptoms.     Notes:      (RNCM)  Track and Manage Symptoms-Heart Failure/DM   On track    Timeframe:  Long-Range Goal Priority:  Medium Start Date:    06/04/21                         Expected End Date:   12/28/21                   Barriers: Knowledge  Follow Up Date 06/30/21    Weigh self daily and keep in log for provider review Notify if you gain 2-3 pounds overnight of 5 pounds in 5 days Develop a rescue plan & follow rescue plan if symptoms flare-up Know when to call the doctor Low salt and low carbohydrate diabetic diet Check blood sugars at least daily, keeping log for provider review     Why is this important?   You will be able to handle your symptoms better if you keep track of them.  Making some simple changes to your lifestyle will help.  Eating healthy is one thing you can do to take good care of yourself.    Notes:         The patient verbalized understanding of instructions, educational materials, and care plan  provided today and declined offer to receive copy of patient instructions, educational materials, and care plan.   The care management team will reach out to the patient again over the next 21 business days.   Rhae Lerner RN, MSN RN Care Management Coordinator [facility] [phone] [email or web address]

## 2021-06-16 NOTE — Chronic Care Management (AMB) (Signed)
Chronic Care Management   CCM RN Visit Note  06/16/2021 Name: Tanya Cole MRN: [id number] DOB: [birth year]  Subjective: Tanya Cole Working is a 87 y.o. year old female who is a primary care patient of Dale Bartlett, MD. The care management team was consulted for assistance with disease management and care coordination needs.    Engaged with patient by telephone for follow up visit in response to provider referral for case management and/or care coordination services.   Consent to Services:  The patient was given information about Chronic Care Management services, agreed to services, and gave verbal consent prior to initiation of services.  Please see initial visit note for detailed documentation.   Patient agreed to services and verbal consent obtained.   Assessment: Review of patient past medical history, allergies, medications, health status, including review of consultants reports, laboratory and other test data, was performed as part of comprehensive evaluation and provision of chronic care management services.   SDOH (Social Determinants of Health) assessments and interventions performed:    CCM Care Plan  Allergies  Allergen Reactions   Tramadol Itching and Nausea And Vomiting    Outpatient Encounter Medications as of 06/16/2021  Medication Sig   fluticasone-salmeterol (ADVAIR) 250-50 MCG/ACT AEPB Inhale 1 puff into the lungs in the morning and at bedtime.   metFORMIN (GLUCOPHAGE) 500 MG tablet TAKE 1 TABLET BY MOUTH TWICE DAILY WITH A MEAL   alendronate (FOSAMAX) 70 MG tablet Take 70 mg by mouth once a week.    aspirin 81 MG tablet Take 81 mg by mouth daily.   atorvastatin (LIPITOR) 80 MG tablet TAKE 1 TABLET BY MOUTH ONCE DAILY AT  6PM   dextromethorphan-guaiFENesin (MUCINEX DM) 30-600 MG 12hr tablet Take 1 tablet by mouth 2 (two) times daily as needed for cough.   fluticasone (FLONASE) 50 MCG/ACT nasal spray Use 2 spray(s) in each nostril once daily    ipratropium-albuterol (DUONEB) 0.5-2.5 (3) MG/3ML SOLN Take 3 mLs by nebulization 2 (two) times daily.   lisinopril (ZESTRIL) 20 MG tablet Take 1 tablet by mouth once daily   metoprolol tartrate (LOPRESSOR) 25 MG tablet Take 1 tablet by mouth twice daily   nitroGLYCERIN (NITROSTAT) 0.4 MG SL tablet Place 1 tablet (0.4 mg total) under the tongue every 5 (five) minutes as needed for chest pain.   ondansetron (ZOFRAN ODT) 4 MG disintegrating tablet Take 1 tablet (4 mg total) by mouth every 8 (eight) hours as needed for nausea or vomiting.   PROAIR HFA 108 (90 Base) MCG/ACT inhaler Inhale 2 puffs into the lungs every 4 (four) hours as needed for wheezing or shortness of breath.   Vitamin D, Ergocalciferol, (DRISDOL) 50000 units CAPS capsule Take 50,000 Units by mouth once a week.   No facility-administered encounter medications on file as of 06/16/2021.    Patient Active Problem List   Diagnosis Date Noted   COPD (chronic obstructive pulmonary disease) (HCC) 05/21/2021   Hypomagnesemia 05/14/2021   CAD (coronary artery disease)    Severe sepsis (HCC)    Hypokalemia    Acute on chronic respiratory failure with hypoxia (HCC)    Asthma exacerbation    Joint ache 04/09/2021   Calcified granuloma of lung (HCC) 03/02/2021   Acute exacerbation of chronic obstructive pulmonary disease (COPD) (HCC) 03/01/2021   COPD exacerbation (HCC) 11/29/2020   Chronic diastolic CHF (congestive heart failure) (HCC) 11/29/2020   CAP (community acquired pneumonia) 11/29/2020   Rib pain on right side 10/19/2020   Abnormal CXR 10/19/2020  Type 2 diabetes mellitus with cardiac complication (HCC) 10/08/2020   Elevated troponin 10/08/2020   Acute on chronic heart failure with preserved ejection fraction (HFpEF) (HCC) 10/08/2020   COPD with acute exacerbation (HCC) 08/19/2020   History of non-ST elevation myocardial infarction (NSTEMI) 08/07/2020   Asthma 08/07/2020   Leukocytosis 08/07/2020   Left shoulder pain  07/12/2020   Iron deficiency 04/16/2020   Anemia 04/04/2020   SOB (shortness of breath) 03/25/2020   Cough 09/30/2019   Gallstone pancreatitis    Pre-op evaluation 08/30/2019   Cholecystitis 08/05/2019   Choledocholithiasis    Respiratory illness 06/18/2019   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) 12/29/2018   Chest pain 10/07/2018   Memory change 05/27/2018   Osteoporosis 02/05/2018   Weight loss 06/24/2017   Abdominal pain, left lower quadrant 10/05/2016   Long term current use of systemic steroids 05/16/2016   Elevated erythrocyte sedimentation rate 05/04/2016   Back pain 04/29/2016   Fatigue 05/24/2015   Health care maintenance 01/25/2015   UTI (urinary tract infection) 07/07/2014   Neuropathy 03/24/2014   Diverticulitis 02/24/2013   Reactive airway disease 09/15/2012   Hypertension 09/14/2012   Hypercholesterolemia 09/14/2012   Diabetes mellitus with cardiac complication (HCC) 09/14/2012    Conditions to be addressed/monitored:CHF, COPD, and DMII  Care Plan : [facility] Plan  Updates made by Maple Mirza, RN since 06/16/2021 12:00 AM     Problem: Patient with increase ER and hospitalizations related to shortness of breath COPD exacerbations   Priority: High     Long-Range Goal: Patient will report no emergency room visits within the next 90 days   Start Date: 06/04/2021  Expected End Date: 12/28/2021  This Visit's Progress: On track  Priority: High  Note:   Current Barriers:  Knowledge Deficits related to plan of care for management of CHF, COPD, and DMII  Chronic Disease Management support and education needs related to CHF, COPD, and DMII; Patient reporting increase in emergency room visits related to shortness of breath;  Denies any increase in shortness of breath at this time.  Denies any need for her rescue inhaler in the last few weeks.  Reports compliance with her twice a day Advair Discus and all other medications.   Reports weighing every other day.  Weight this morning was 118 pounds, denies any lower extremity edema.  Monitors blood sugars.  Fasting blood sugar this morning was 122.  RNCM Clinical Goal(s):  Patient will verbalize understanding of plan for management of CHF, COPD, and DMII verbalize basic understanding of CHF, COPD, and DMII disease process and self health management plan to manage conditions take all medications exactly as prescribed and will call provider for medication related questions demonstrate a decrease in CHF, COPD, and DMII exacerbations requiring emergency room visits or hospitalizations experience decrease in ED visits. ED visits in last 6 months = 7 (2 admissions)  through collaboration with RN Care manager, provider, and care team.   Interventions: 1:1 collaboration with primary care provider regarding development and update of comprehensive plan of care as evidenced by provider attestation and co-signature Inter-disciplinary care team collaboration (see longitudinal plan of care) Evaluation of current treatment plan related to  self management and patient's adherence to plan as established by provider;  COPD: (Status: Goal on track: YES.) Provided patient with basic written and verbal COPD education on self care/management/and exacerbation prevention; Advised patient to track and manage COPD triggers;  Provided instruction about proper use of medications  used for management of COPD including inhalers; Advised patient to self assesses COPD action plan zone and make appointment with provider if in the yellow zone for 48 hours without improvement; Discussed the importance of adequate rest and management of fatigue with COPD; Encouraged patient to use nebulizer if rescue inhaler not helping shortness of breath Discussed contacting providers for shortness of breath or increased need for rescue inhaler or nebulizer prior to needing to call EMS Discussed referral placed to Dr.  Meredeth Ide for follow up appointment  Heart Failure Interventions:  (Status: Goal on track: YES.) Basic overview and discussion of pathophysiology of Heart Failure reviewed; Provided education on low sodium diet; Reviewed Heart Failure Action Plan in depth and provided written copy; Provided education about placing scale on hard, flat surface; Advised patient to weigh each morning after emptying bladder; Discussed importance of daily weight and advised patient to weigh and record daily; Discussed the importance of keeping all appointments with provider;  Diabetes:  (Status: New goal.) Lab Results  Component Value Date   HGBA1C 7.3 (H) 03/31/2021  Assessed patient's understanding of A1c goal: <7% Provided education to patient about basic DM disease process; Reviewed medications with patient and discussed importance of medication adherence;        Reviewed prescribed diet with patient low salt carb modified diabetic; Advised patient, providing education and rationale, to check cbg daily and record        call provider for findings outside established parameters;         Patient Goals/Self-Care Activities: Patient will self administer medications as prescribed Patient will attend all scheduled provider appointments Patient will continue to perform ADL's independently Develop a rescue plan & follow rescue plan if symptoms flare-up Eliminate symptom triggers at home Consider using nebulizer for increase in shortness of breath Contact provider for increase shortness of breath and increased need for rescue inhaler  Keep follow-up appointments Dr. Meredeth Ide with Pulmonology should be contacting for follow up appointment Weigh self daily and keep in log for provider review Notify if you gain 2-3 pounds overnight of 5 pounds in 5 days Develop a rescue plan & follow rescue plan if symptoms flare-up Know when to call the doctor Low salt and low carbohydrate diabetic diet Check blood sugars at  least daily, keeping log for provider review      Plan:The care management team will reach out to the patient again over the next 21 business days.  Rhae Lerner RN, MSN RN Care Management Coordinator [facility] [phone] [email or web address]

## 2021-06-18 DIAGNOSIS — J441 Chronic obstructive pulmonary disease with (acute) exacerbation: Secondary | ICD-10-CM | POA: Diagnosis not present

## 2021-06-18 DIAGNOSIS — I110 Hypertensive heart disease with heart failure: Secondary | ICD-10-CM | POA: Diagnosis not present

## 2021-06-18 DIAGNOSIS — E7800 Pure hypercholesterolemia, unspecified: Secondary | ICD-10-CM | POA: Diagnosis not present

## 2021-06-18 DIAGNOSIS — J9621 Acute and chronic respiratory failure with hypoxia: Secondary | ICD-10-CM | POA: Diagnosis not present

## 2021-06-18 DIAGNOSIS — M25562 Pain in left knee: Secondary | ICD-10-CM | POA: Diagnosis not present

## 2021-06-18 DIAGNOSIS — I5032 Chronic diastolic (congestive) heart failure: Secondary | ICD-10-CM | POA: Diagnosis not present

## 2021-06-18 DIAGNOSIS — G8929 Other chronic pain: Secondary | ICD-10-CM | POA: Diagnosis not present

## 2021-06-18 DIAGNOSIS — Z7984 Long term (current) use of oral hypoglycemic drugs: Secondary | ICD-10-CM | POA: Diagnosis not present

## 2021-06-18 DIAGNOSIS — I2510 Atherosclerotic heart disease of native coronary artery without angina pectoris: Secondary | ICD-10-CM | POA: Diagnosis not present

## 2021-06-22 ENCOUNTER — Other Ambulatory Visit: Payer: Self-pay

## 2021-06-22 ENCOUNTER — Other Ambulatory Visit (INDEPENDENT_AMBULATORY_CARE_PROVIDER_SITE_OTHER): Payer: Medicare HMO

## 2021-06-22 ENCOUNTER — Ambulatory Visit (INDEPENDENT_AMBULATORY_CARE_PROVIDER_SITE_OTHER): Payer: Medicare HMO

## 2021-06-22 DIAGNOSIS — E538 Deficiency of other specified B group vitamins: Secondary | ICD-10-CM | POA: Diagnosis not present

## 2021-06-22 DIAGNOSIS — I2510 Atherosclerotic heart disease of native coronary artery without angina pectoris: Secondary | ICD-10-CM | POA: Diagnosis not present

## 2021-06-22 DIAGNOSIS — G8929 Other chronic pain: Secondary | ICD-10-CM | POA: Diagnosis not present

## 2021-06-22 DIAGNOSIS — D72829 Elevated white blood cell count, unspecified: Secondary | ICD-10-CM | POA: Diagnosis not present

## 2021-06-22 DIAGNOSIS — I5032 Chronic diastolic (congestive) heart failure: Secondary | ICD-10-CM | POA: Diagnosis not present

## 2021-06-22 DIAGNOSIS — I110 Hypertensive heart disease with heart failure: Secondary | ICD-10-CM | POA: Diagnosis not present

## 2021-06-22 DIAGNOSIS — J9621 Acute and chronic respiratory failure with hypoxia: Secondary | ICD-10-CM | POA: Diagnosis not present

## 2021-06-22 DIAGNOSIS — J441 Chronic obstructive pulmonary disease with (acute) exacerbation: Secondary | ICD-10-CM | POA: Diagnosis not present

## 2021-06-22 DIAGNOSIS — E7800 Pure hypercholesterolemia, unspecified: Secondary | ICD-10-CM | POA: Diagnosis not present

## 2021-06-22 DIAGNOSIS — Z7984 Long term (current) use of oral hypoglycemic drugs: Secondary | ICD-10-CM | POA: Diagnosis not present

## 2021-06-22 DIAGNOSIS — M25562 Pain in left knee: Secondary | ICD-10-CM | POA: Diagnosis not present

## 2021-06-22 MED ORDER — CYANOCOBALAMIN 1000 MCG/ML IJ SOLN
1000.0000 ug | Freq: Once | INTRAMUSCULAR | Status: AC
  Administered 2021-06-22: 1000 ug via INTRAMUSCULAR

## 2021-06-22 NOTE — Progress Notes (Signed)
Patient presented for B 12 injection to left deltoid, patient voiced no concerns nor showed any signs of distress during injection. 

## 2021-06-23 DIAGNOSIS — M810 Age-related osteoporosis without current pathological fracture: Secondary | ICD-10-CM | POA: Diagnosis not present

## 2021-06-23 DIAGNOSIS — E119 Type 2 diabetes mellitus without complications: Secondary | ICD-10-CM | POA: Diagnosis not present

## 2021-06-23 DIAGNOSIS — I2510 Atherosclerotic heart disease of native coronary artery without angina pectoris: Secondary | ICD-10-CM | POA: Diagnosis not present

## 2021-06-23 DIAGNOSIS — J302 Other seasonal allergic rhinitis: Secondary | ICD-10-CM | POA: Diagnosis not present

## 2021-06-23 DIAGNOSIS — I10 Essential (primary) hypertension: Secondary | ICD-10-CM | POA: Diagnosis not present

## 2021-06-23 DIAGNOSIS — J449 Chronic obstructive pulmonary disease, unspecified: Secondary | ICD-10-CM | POA: Diagnosis not present

## 2021-06-23 DIAGNOSIS — E785 Hyperlipidemia, unspecified: Secondary | ICD-10-CM | POA: Diagnosis not present

## 2021-06-23 DIAGNOSIS — G8929 Other chronic pain: Secondary | ICD-10-CM | POA: Diagnosis not present

## 2021-06-23 DIAGNOSIS — M1990 Unspecified osteoarthritis, unspecified site: Secondary | ICD-10-CM | POA: Diagnosis not present

## 2021-06-23 DIAGNOSIS — Z008 Encounter for other general examination: Secondary | ICD-10-CM | POA: Diagnosis not present

## 2021-06-23 DIAGNOSIS — I252 Old myocardial infarction: Secondary | ICD-10-CM | POA: Diagnosis not present

## 2021-06-23 LAB — CBC WITH DIFFERENTIAL/PLATELET
Basophils Absolute: 0.1 10*3/uL (ref 0.0–0.1)
Basophils Relative: 0.7 % (ref 0.0–3.0)
Eosinophils Absolute: 1.1 10*3/uL — ABNORMAL HIGH (ref 0.0–0.7)
Eosinophils Relative: 10.5 % — ABNORMAL HIGH (ref 0.0–5.0)
HCT: 39.3 % (ref 36.0–46.0)
Hemoglobin: 13 g/dL (ref 12.0–15.0)
Lymphocytes Relative: 22.1 % (ref 12.0–46.0)
Lymphs Abs: 2.3 10*3/uL (ref 0.7–4.0)
MCHC: 33.1 g/dL (ref 30.0–36.0)
MCV: 87.2 fl (ref 78.0–100.0)
Monocytes Absolute: 0.7 10*3/uL (ref 0.1–1.0)
Monocytes Relative: 7 % (ref 3.0–12.0)
Neutro Abs: 6.2 10*3/uL (ref 1.4–7.7)
Neutrophils Relative %: 59.7 % (ref 43.0–77.0)
Platelets: 287 10*3/uL (ref 150.0–400.0)
RBC: 4.5 Mil/uL (ref 3.87–5.11)
RDW: 14.7 % (ref 11.5–15.5)
WBC: 10.4 10*3/uL (ref 4.0–10.5)

## 2021-06-24 ENCOUNTER — Telehealth: Payer: Self-pay | Admitting: Internal Medicine

## 2021-06-24 NOTE — Telephone Encounter (Signed)
Patients daughter Victorino Dike is returning your call to go over her mother's lab results.Please call her at [phone].

## 2021-06-24 NOTE — Telephone Encounter (Signed)
See result note.  

## 2021-06-25 ENCOUNTER — Telehealth: Payer: Self-pay | Admitting: Internal Medicine

## 2021-06-25 NOTE — Telephone Encounter (Signed)
Patient stated that the home health nurse instructed her to go back on metformin. The patient also stated that Dr. Lorin Picket told her not to take metformin. The patient is requesting a call back.

## 2021-06-26 ENCOUNTER — Encounter: Payer: Self-pay | Admitting: Emergency Medicine

## 2021-06-26 ENCOUNTER — Emergency Department: Payer: Medicare HMO

## 2021-06-26 ENCOUNTER — Other Ambulatory Visit: Payer: Self-pay

## 2021-06-26 DIAGNOSIS — Z7982 Long term (current) use of aspirin: Secondary | ICD-10-CM

## 2021-06-26 DIAGNOSIS — M8580 Other specified disorders of bone density and structure, unspecified site: Secondary | ICD-10-CM | POA: Diagnosis present

## 2021-06-26 DIAGNOSIS — G9349 Other encephalopathy: Secondary | ICD-10-CM | POA: Diagnosis present

## 2021-06-26 DIAGNOSIS — U071 COVID-19: Secondary | ICD-10-CM | POA: Diagnosis not present

## 2021-06-26 DIAGNOSIS — F05 Delirium due to known physiological condition: Secondary | ICD-10-CM | POA: Diagnosis not present

## 2021-06-26 DIAGNOSIS — J9811 Atelectasis: Secondary | ICD-10-CM | POA: Diagnosis not present

## 2021-06-26 DIAGNOSIS — Z825 Family history of asthma and other chronic lower respiratory diseases: Secondary | ICD-10-CM

## 2021-06-26 DIAGNOSIS — E7800 Pure hypercholesterolemia, unspecified: Secondary | ICD-10-CM | POA: Diagnosis present

## 2021-06-26 DIAGNOSIS — I25118 Atherosclerotic heart disease of native coronary artery with other forms of angina pectoris: Secondary | ICD-10-CM | POA: Diagnosis present

## 2021-06-26 DIAGNOSIS — I959 Hypotension, unspecified: Secondary | ICD-10-CM | POA: Diagnosis not present

## 2021-06-26 DIAGNOSIS — Z9049 Acquired absence of other specified parts of digestive tract: Secondary | ICD-10-CM

## 2021-06-26 DIAGNOSIS — R079 Chest pain, unspecified: Secondary | ICD-10-CM | POA: Diagnosis not present

## 2021-06-26 DIAGNOSIS — Z90710 Acquired absence of both cervix and uterus: Secondary | ICD-10-CM

## 2021-06-26 DIAGNOSIS — I252 Old myocardial infarction: Secondary | ICD-10-CM

## 2021-06-26 DIAGNOSIS — R0789 Other chest pain: Secondary | ICD-10-CM | POA: Diagnosis not present

## 2021-06-26 DIAGNOSIS — Z66 Do not resuscitate: Secondary | ICD-10-CM | POA: Diagnosis present

## 2021-06-26 DIAGNOSIS — Z885 Allergy status to narcotic agent status: Secondary | ICD-10-CM

## 2021-06-26 DIAGNOSIS — Z955 Presence of coronary angioplasty implant and graft: Secondary | ICD-10-CM

## 2021-06-26 DIAGNOSIS — I110 Hypertensive heart disease with heart failure: Secondary | ICD-10-CM | POA: Diagnosis present

## 2021-06-26 DIAGNOSIS — J1282 Pneumonia due to coronavirus disease 2019: Secondary | ICD-10-CM | POA: Diagnosis present

## 2021-06-26 DIAGNOSIS — Z7951 Long term (current) use of inhaled steroids: Secondary | ICD-10-CM

## 2021-06-26 DIAGNOSIS — E1165 Type 2 diabetes mellitus with hyperglycemia: Secondary | ICD-10-CM | POA: Diagnosis not present

## 2021-06-26 DIAGNOSIS — R0689 Other abnormalities of breathing: Secondary | ICD-10-CM | POA: Diagnosis not present

## 2021-06-26 DIAGNOSIS — J440 Chronic obstructive pulmonary disease with acute lower respiratory infection: Secondary | ICD-10-CM | POA: Diagnosis present

## 2021-06-26 DIAGNOSIS — Z8249 Family history of ischemic heart disease and other diseases of the circulatory system: Secondary | ICD-10-CM

## 2021-06-26 DIAGNOSIS — J9601 Acute respiratory failure with hypoxia: Secondary | ICD-10-CM | POA: Diagnosis present

## 2021-06-26 DIAGNOSIS — D649 Anemia, unspecified: Secondary | ICD-10-CM | POA: Diagnosis present

## 2021-06-26 DIAGNOSIS — E876 Hypokalemia: Secondary | ICD-10-CM | POA: Diagnosis present

## 2021-06-26 DIAGNOSIS — I214 Non-ST elevation (NSTEMI) myocardial infarction: Secondary | ICD-10-CM | POA: Diagnosis present

## 2021-06-26 DIAGNOSIS — N179 Acute kidney failure, unspecified: Secondary | ICD-10-CM | POA: Diagnosis present

## 2021-06-26 DIAGNOSIS — I471 Supraventricular tachycardia: Secondary | ICD-10-CM | POA: Diagnosis present

## 2021-06-26 DIAGNOSIS — R0602 Shortness of breath: Secondary | ICD-10-CM | POA: Diagnosis not present

## 2021-06-26 DIAGNOSIS — Z79899 Other long term (current) drug therapy: Secondary | ICD-10-CM

## 2021-06-26 DIAGNOSIS — I5032 Chronic diastolic (congestive) heart failure: Secondary | ICD-10-CM | POA: Diagnosis present

## 2021-06-26 DIAGNOSIS — K449 Diaphragmatic hernia without obstruction or gangrene: Secondary | ICD-10-CM | POA: Diagnosis not present

## 2021-06-26 DIAGNOSIS — Z7984 Long term (current) use of oral hypoglycemic drugs: Secondary | ICD-10-CM

## 2021-06-26 DIAGNOSIS — M353 Polymyalgia rheumatica: Secondary | ICD-10-CM | POA: Diagnosis present

## 2021-06-26 LAB — BASIC METABOLIC PANEL
Anion gap: 11 (ref 5–15)
BUN: 25 mg/dL — ABNORMAL HIGH (ref 8–23)
CO2: 23 mmol/L (ref 22–32)
Calcium: 8.8 mg/dL — ABNORMAL LOW (ref 8.9–10.3)
Chloride: 102 mmol/L (ref 98–111)
Creatinine, Ser: 1.22 mg/dL — ABNORMAL HIGH (ref 0.44–1.00)
GFR, Estimated: 43 mL/min — ABNORMAL LOW (ref >60)
Glucose, Bld: 153 mg/dL — ABNORMAL HIGH (ref 70–99)
Potassium: 3.7 mmol/L (ref 3.5–5.1)
Sodium: 136 mmol/L (ref 135–145)

## 2021-06-26 LAB — CBC
HCT: 38.5 % (ref 36.0–46.0)
Hemoglobin: 12.9 g/dL (ref 12.0–15.0)
MCH: 30.1 pg (ref 26.0–34.0)
MCHC: 33.5 g/dL (ref 30.0–36.0)
MCV: 89.7 fL (ref 80.0–100.0)
Platelets: 258 10*3/uL (ref 150–400)
RBC: 4.29 MIL/uL (ref 3.87–5.11)
RDW: 14 % (ref 11.5–15.5)
WBC: 18 10*3/uL — ABNORMAL HIGH (ref 4.0–10.5)
nRBC: 0 % (ref 0.0–0.2)

## 2021-06-26 LAB — TROPONIN I (HIGH SENSITIVITY)
Troponin I (High Sensitivity): 8 ng/L (ref <18)
Troponin I (High Sensitivity): 10 ng/L (ref <18)

## 2021-06-26 NOTE — ED Triage Notes (Addendum)
FIRST NURSE NOTE:  Pt arrived via [facility] from home c/o CP, c/o pressure, 8/10, gave 1 NTG at home  WITH EMS 324 ASA 1 NTG 20g IV L wrist  125/49  CBG 211 33 CO2 94% RA NSR  400cc IVF given

## 2021-06-26 NOTE — ED Triage Notes (Signed)
Pt to ED via [facility] with c/o 7/10 substernal CP that started approx 1.5 hrs PTA. Pt A&O x4, describes pain as discomfort to substernal chest, states pain is easing off at this time.

## 2021-06-27 ENCOUNTER — Inpatient Hospital Stay
Admission: EM | Admit: 2021-06-27 | Discharge: 2021-07-02 | DRG: 177 | Disposition: A | Discharge status: Home / Self Care | Payer: Medicare HMO | Attending: Family Medicine | Admitting: Family Medicine

## 2021-06-27 ENCOUNTER — Observation Stay: Payer: Medicare HMO

## 2021-06-27 ENCOUNTER — Encounter: Payer: Self-pay | Admitting: Family Medicine

## 2021-06-27 DIAGNOSIS — E785 Hyperlipidemia, unspecified: Secondary | ICD-10-CM

## 2021-06-27 DIAGNOSIS — R0789 Other chest pain: Secondary | ICD-10-CM | POA: Diagnosis not present

## 2021-06-27 DIAGNOSIS — I2510 Atherosclerotic heart disease of native coronary artery without angina pectoris: Secondary | ICD-10-CM

## 2021-06-27 DIAGNOSIS — A419 Sepsis, unspecified organism: Secondary | ICD-10-CM

## 2021-06-27 DIAGNOSIS — I10 Essential (primary) hypertension: Secondary | ICD-10-CM | POA: Diagnosis not present

## 2021-06-27 DIAGNOSIS — E1169 Type 2 diabetes mellitus with other specified complication: Secondary | ICD-10-CM

## 2021-06-27 DIAGNOSIS — I5032 Chronic diastolic (congestive) heart failure: Secondary | ICD-10-CM | POA: Diagnosis present

## 2021-06-27 DIAGNOSIS — R079 Chest pain, unspecified: Secondary | ICD-10-CM | POA: Diagnosis not present

## 2021-06-27 DIAGNOSIS — U071 COVID-19: Principal | ICD-10-CM

## 2021-06-27 DIAGNOSIS — E1159 Type 2 diabetes mellitus with other circulatory complications: Secondary | ICD-10-CM | POA: Diagnosis present

## 2021-06-27 DIAGNOSIS — R059 Cough, unspecified: Secondary | ICD-10-CM | POA: Diagnosis present

## 2021-06-27 DIAGNOSIS — I25118 Atherosclerotic heart disease of native coronary artery with other forms of angina pectoris: Secondary | ICD-10-CM | POA: Diagnosis present

## 2021-06-27 DIAGNOSIS — J9811 Atelectasis: Secondary | ICD-10-CM | POA: Diagnosis not present

## 2021-06-27 DIAGNOSIS — N179 Acute kidney failure, unspecified: Secondary | ICD-10-CM

## 2021-06-27 DIAGNOSIS — J45909 Unspecified asthma, uncomplicated: Secondary | ICD-10-CM | POA: Diagnosis present

## 2021-06-27 DIAGNOSIS — J9601 Acute respiratory failure with hypoxia: Secondary | ICD-10-CM | POA: Diagnosis present

## 2021-06-27 DIAGNOSIS — K449 Diaphragmatic hernia without obstruction or gangrene: Secondary | ICD-10-CM | POA: Diagnosis not present

## 2021-06-27 DIAGNOSIS — R0602 Shortness of breath: Secondary | ICD-10-CM | POA: Diagnosis not present

## 2021-06-27 DIAGNOSIS — Z9049 Acquired absence of other specified parts of digestive tract: Secondary | ICD-10-CM | POA: Diagnosis not present

## 2021-06-27 DIAGNOSIS — Z8616 Personal history of COVID-19: Secondary | ICD-10-CM | POA: Diagnosis present

## 2021-06-27 DIAGNOSIS — E7800 Pure hypercholesterolemia, unspecified: Secondary | ICD-10-CM | POA: Diagnosis present

## 2021-06-27 DIAGNOSIS — D649 Anemia, unspecified: Secondary | ICD-10-CM | POA: Diagnosis present

## 2021-06-27 LAB — BRAIN NATRIURETIC PEPTIDE: B Natriuretic Peptide: 42.3 pg/mL (ref 0.0–100.0)

## 2021-06-27 LAB — FERRITIN: Ferritin: 106 ng/mL (ref 11–307)

## 2021-06-27 LAB — D-DIMER, QUANTITATIVE: D-Dimer, Quant: 0.82 ug/mL-FEU — ABNORMAL HIGH (ref 0.00–0.50)

## 2021-06-27 LAB — TROPONIN I (HIGH SENSITIVITY)
Troponin I (High Sensitivity): 16 ng/L (ref <18)
Troponin I (High Sensitivity): 13 ng/L (ref <18)

## 2021-06-27 LAB — RESP PANEL BY RT-PCR (FLU A&B, COVID) ARPGX2
Influenza A by PCR: NEGATIVE
Influenza B by PCR: NEGATIVE
SARS Coronavirus 2 by RT PCR: POSITIVE — AB

## 2021-06-27 LAB — LIPID PANEL
Cholesterol: 175 mg/dL (ref 0–200)
HDL: 44 mg/dL (ref >40)
LDL Cholesterol: 108 mg/dL — ABNORMAL HIGH (ref 0–99)
Total CHOL/HDL Ratio: 4 RATIO
Triglycerides: 113 mg/dL (ref <150)
VLDL: 23 mg/dL (ref 0–40)

## 2021-06-27 LAB — C-REACTIVE PROTEIN: CRP: 12.4 mg/dL — ABNORMAL HIGH (ref <1.0)

## 2021-06-27 LAB — LACTATE DEHYDROGENASE: LDH: 125 U/L (ref 98–192)

## 2021-06-27 LAB — PROCALCITONIN: Procalcitonin: <0.1 ng/mL

## 2021-06-27 LAB — FIBRINOGEN: Fibrinogen: 557 mg/dL — ABNORMAL HIGH (ref 210–475)

## 2021-06-27 LAB — GLUCOSE, CAPILLARY: Glucose-Capillary: 125 mg/dL — ABNORMAL HIGH (ref 70–99)

## 2021-06-27 MED ORDER — NITROGLYCERIN 0.4 MG SL SUBL: 0.4000 mg | SUBLINGUAL_TABLET | SUBLINGUAL | Status: DC | PRN

## 2021-06-27 MED ORDER — DM-GUAIFENESIN ER 30-600 MG PO TB12: 1.0000 | ORAL_TABLET | Freq: Two times a day (BID) | ORAL | Status: DC | PRN

## 2021-06-27 MED ORDER — DICLOFENAC SODIUM 1 % EX GEL: 2.0000 g | Freq: Four times a day (QID) | CUTANEOUS | Status: DC

## 2021-06-27 MED ORDER — ALBUTEROL SULFATE HFA 108 (90 BASE) MCG/ACT IN AERS
2.0000 | INHALATION_SPRAY | RESPIRATORY_TRACT | Status: DC | PRN
  Filled 2021-06-27: qty 6.7

## 2021-06-27 MED ORDER — ASCORBIC ACID 500 MG PO TABS
500.0000 mg | ORAL_TABLET | Freq: Every day | ORAL | Status: DC
  Administered 2021-06-27 – 2021-07-02 (×6): 500 mg via ORAL
  Filled 2021-06-27 (×6): qty 1

## 2021-06-27 MED ORDER — SODIUM CHLORIDE 0.9 % IV SOLN
200.0000 mg | Freq: Once | INTRAVENOUS | Status: AC
  Administered 2021-06-27: 200 mg via INTRAVENOUS
  Filled 2021-06-27: qty 40

## 2021-06-27 MED ORDER — ZINC SULFATE 220 (50 ZN) MG PO CAPS
220.0000 mg | ORAL_CAPSULE | Freq: Every day | ORAL | Status: DC
  Administered 2021-06-27 – 2021-07-02 (×6): 220 mg via ORAL
  Filled 2021-06-27 (×6): qty 1

## 2021-06-27 MED ORDER — ASPIRIN 81 MG PO CHEW
324.0000 mg | CHEWABLE_TABLET | Freq: Once | ORAL | Status: AC
  Administered 2021-06-27: 324 mg via ORAL
  Filled 2021-06-27: qty 4

## 2021-06-27 MED ORDER — GUAIFENESIN ER 600 MG PO TB12
600.0000 mg | ORAL_TABLET | Freq: Two times a day (BID) | ORAL | Status: DC
  Administered 2021-06-27 – 2021-07-02 (×11): 600 mg via ORAL
  Filled 2021-06-27 (×11): qty 1

## 2021-06-27 MED ORDER — ENOXAPARIN SODIUM 30 MG/0.3ML IJ SOSY
30.0000 mg | PREFILLED_SYRINGE | INTRAMUSCULAR | Status: DC
  Administered 2021-06-27: 30 mg via SUBCUTANEOUS
  Filled 2021-06-27 (×2): qty 0.3

## 2021-06-27 MED ORDER — MORPHINE SULFATE (PF) 2 MG/ML IV SOLN
2.0000 mg | INTRAVENOUS | Status: DC | PRN
  Administered 2021-06-29: 2 mg via INTRAVENOUS
  Filled 2021-06-27: qty 1

## 2021-06-27 MED ORDER — ALPRAZOLAM 0.25 MG PO TABS
0.2500 mg | ORAL_TABLET | Freq: Two times a day (BID) | ORAL | Status: DC | PRN
  Administered 2021-06-27 – 2021-06-30 (×4): 0.25 mg via ORAL
  Filled 2021-06-27 (×4): qty 1

## 2021-06-27 MED ORDER — IPRATROPIUM-ALBUTEROL 20-100 MCG/ACT IN AERS
1.0000 | INHALATION_SPRAY | Freq: Four times a day (QID) | RESPIRATORY_TRACT | Status: DC
  Administered 2021-06-27 – 2021-07-02 (×16): 1 via RESPIRATORY_TRACT
  Filled 2021-06-27: qty 4

## 2021-06-27 MED ORDER — LIDOCAINE 5 % EX PTCH: 1.0000 | MEDICATED_PATCH | CUTANEOUS | Status: DC

## 2021-06-27 MED ORDER — ONDANSETRON 4 MG PO TBDP
4.0000 mg | ORAL_TABLET | Freq: Three times a day (TID) | ORAL | Status: DC | PRN
  Filled 2021-06-27: qty 1

## 2021-06-27 MED ORDER — ACETAMINOPHEN 325 MG PO TABS
650.0000 mg | ORAL_TABLET | ORAL | Status: DC | PRN
  Administered 2021-06-28 – 2021-06-30 (×7): 650 mg via ORAL
  Filled 2021-06-27 (×9): qty 2

## 2021-06-27 MED ORDER — METOPROLOL TARTRATE 25 MG PO TABS
25.0000 mg | ORAL_TABLET | Freq: Two times a day (BID) | ORAL | Status: DC
  Administered 2021-06-27 – 2021-06-30 (×8): 25 mg via ORAL
  Filled 2021-06-27 (×8): qty 1

## 2021-06-27 MED ORDER — ZOLPIDEM TARTRATE 5 MG PO TABS
5.0000 mg | ORAL_TABLET | Freq: Every evening | ORAL | Status: DC | PRN
  Administered 2021-06-27 – 2021-06-28 (×2): 5 mg via ORAL
  Filled 2021-06-27 (×2): qty 1

## 2021-06-27 MED ORDER — MAGNESIUM HYDROXIDE 400 MG/5ML PO SUSP: 30.0000 mL | Freq: Every day | ORAL | Status: DC | PRN

## 2021-06-27 MED ORDER — LISINOPRIL 20 MG PO TABS
20.0000 mg | ORAL_TABLET | Freq: Every day | ORAL | Status: DC
  Administered 2021-06-27 – 2021-06-30 (×4): 20 mg via ORAL
  Filled 2021-06-27: qty 2
  Filled 2021-06-27 (×3): qty 1

## 2021-06-27 MED ORDER — VITAMIN D 25 MCG (1000 UNIT) PO TABS
1000.0000 [IU] | ORAL_TABLET | Freq: Every day | ORAL | Status: DC
  Administered 2021-06-27 – 2021-07-02 (×6): 1000 [IU] via ORAL
  Filled 2021-06-27 (×6): qty 1

## 2021-06-27 MED ORDER — ASPIRIN EC 81 MG PO TBEC
81.0000 mg | DELAYED_RELEASE_TABLET | Freq: Every day | ORAL | Status: DC
  Administered 2021-06-27 – 2021-07-02 (×6): 81 mg via ORAL
  Filled 2021-06-27 (×6): qty 1

## 2021-06-27 MED ORDER — IPRATROPIUM-ALBUTEROL 0.5-2.5 (3) MG/3ML IN SOLN
3.0000 mL | Freq: Two times a day (BID) | RESPIRATORY_TRACT | Status: DC
  Administered 2021-06-27: 3 mL via RESPIRATORY_TRACT
  Filled 2021-06-27: qty 3

## 2021-06-27 MED ORDER — LIDOCAINE VISCOUS HCL 2 % MT SOLN: 15.0000 mL | Freq: Once | OROMUCOSAL | Status: DC

## 2021-06-27 MED ORDER — IOHEXOL 350 MG/ML SOLN
75.0000 mL | Freq: Once | INTRAVENOUS | Status: AC | PRN
  Administered 2021-06-27: 75 mL via INTRAVENOUS
  Filled 2021-06-27: qty 75

## 2021-06-27 MED ORDER — ALENDRONATE SODIUM 70 MG PO TABS: 70.0000 mg | ORAL_TABLET | ORAL | Status: DC

## 2021-06-27 MED ORDER — FLUTICASONE PROPIONATE 50 MCG/ACT NA SUSP
2.0000 | Freq: Every day | NASAL | Status: DC
  Administered 2021-06-28 – 2021-07-02 (×5): 2 via NASAL
  Filled 2021-06-27 (×2): qty 16

## 2021-06-27 MED ORDER — ONDANSETRON HCL 4 MG/2ML IJ SOLN: 4.0000 mg | Freq: Four times a day (QID) | INTRAMUSCULAR | Status: DC | PRN

## 2021-06-27 MED ORDER — SODIUM CHLORIDE 0.9 % IV SOLN
100.0000 mg | Freq: Every day | INTRAVENOUS | Status: AC
  Administered 2021-06-28 – 2021-07-01 (×4): 100 mg via INTRAVENOUS
  Filled 2021-06-27 (×3): qty 100
  Filled 2021-06-27: qty 20

## 2021-06-27 MED ORDER — GUAIFENESIN-DM 100-10 MG/5ML PO SYRP
10.0000 mL | ORAL_SOLUTION | ORAL | Status: DC | PRN
  Administered 2021-06-28 – 2021-06-29 (×3): 10 mL via ORAL
  Filled 2021-06-27 (×4): qty 10

## 2021-06-27 MED ORDER — ALUM & MAG HYDROXIDE-SIMETH 200-200-20 MG/5ML PO SUSP: 30.0000 mL | Freq: Once | ORAL | Status: DC

## 2021-06-27 MED ORDER — SODIUM CHLORIDE 0.9 % IV SOLN: INTRAVENOUS | Status: DC

## 2021-06-27 MED ORDER — ATORVASTATIN CALCIUM 80 MG PO TABS
80.0000 mg | ORAL_TABLET | Freq: Every evening | ORAL | Status: DC
  Administered 2021-06-27 – 2021-07-01 (×4): 80 mg via ORAL
  Filled 2021-06-27 (×4): qty 1

## 2021-06-27 MED ORDER — HYDROCOD POLST-CPM POLST ER 10-8 MG/5ML PO SUER
5.0000 mL | Freq: Two times a day (BID) | ORAL | Status: DC | PRN
  Administered 2021-07-01: 5 mL via ORAL
  Filled 2021-06-27: qty 5

## 2021-06-27 MED ORDER — VITAMIN D (ERGOCALCIFEROL) 1.25 MG (50000 UNIT) PO CAPS
50000.0000 [IU] | ORAL_CAPSULE | ORAL | Status: DC
  Filled 2021-06-27 (×2): qty 1

## 2021-06-27 NOTE — Progress Notes (Signed)
PHARMACIST - PHYSICIAN COMMUNICATION  CONCERNING:  Enoxaparin (Lovenox) for DVT Prophylaxis    RECOMMENDATION: Patient was prescribed enoxaprin 40mg  q24 hours for VTE prophylaxis.   Filed Weights   06/26/21 1946  Weight: 53.5 kg (118 lb)    Body mass index is 22.3 kg/m.  Estimated Creatinine Clearance: 25 mL/min (A) (by C-G formula based on SCr of 1.22 mg/dL (H)).  Patient is candidate for enoxaparin 30mg  every 24 hours based on CrCl <17ml/min or Weight <45kg  DESCRIPTION: Pharmacy has adjusted enoxaparin dose per [facility] policy.  Patient is now receiving enoxaparin 30 mg every 24 hours   31m, PharmD, [facility] 06/27/2021 2:37 AM

## 2021-06-27 NOTE — ED Notes (Signed)
Diet order placed per MD verbal order

## 2021-06-27 NOTE — ED Notes (Signed)
Pt took off purewick, all monitoring leads and oxygen to walk out to hall bathroom. Reminded pt to stay in room and use BSC. Pt stated she didn't remember if she was supposed to wear oxygen. Explained that this is just since has been in hospital with covid. Pt currently on BSC.

## 2021-06-27 NOTE — Progress Notes (Signed)
  PROGRESS NOTE    [place]ale [place]ock  KGY:[id number] DOB: 11/21/1932 DOA: 06/27/2021  PCP: Dale Manhattan, MD    LOS - 0    Patient admitted after midnight with chest pain and found to be positive for Covid-19 with acute hypoxic respiratory failure.  ACS ruled out with negative troponin x  3 and non-acute ECG.     Interval subjective: Pt seen in ED on hold for a bed.  Nasal cannula was out of her nose and spO2 was 88% on monitor, improved to 99% with [place] replaced and on 3 L/min.  Pt reports feeling a little bit better.  Feels weak and tired.  No other specific acute complaints.  She was mildly confused, thought she was at home at first, then remembered she's in the hospital.  Exam: awake, alert, confused, NAD CV: RRR no peripheral edema Resp: Lungs diminished but clear, normal respiratory effort, on 3 L/min [place] O2 GI: abdomen soft and non-tender Neuro: normal speech, grossly non-focal exam   Active Problems:   Chest pain    I have reviewed the full H&P by Dr. Arville Care in detail, and I agree with the assessment and plan as outlined therein.  In Addition -   Chronic diastolic CHF  last echo in Oct 2021 with EF 60-65%, grade II diastolic dysfunction.  Pt appears compensated, euvolemic.  Monitor volume status.    No Charge    Pennie Banter, DO [facility]   If 7PM-7AM, please contact night-coverage [email or web address] 06/27/2021, 4:37 PM

## 2021-06-27 NOTE — Progress Notes (Signed)
Remdesivir - Pharmacy Brief Note   O:  CXR: "No active cardiopulmonary disease." SpO2: 93-96% on RA   A/P:  Remdesivir 200 mg IVPB once followed by 100 mg IVPB daily x 4 days.   Otelia Sergeant, PharmD, [facility] 06/27/2021 4:57 AM

## 2021-06-27 NOTE — ED Notes (Signed)
Attempted to call cardiologist back to speak with pt but this RN unable to reach them at this time.

## 2021-06-27 NOTE — ED Notes (Signed)
Report given to TIA, RN - all questions answered.

## 2021-06-27 NOTE — H&P (Signed)
[name]   PATIENT NAME: Tanya Cole    MR#:  [id number]  DATE OF BIRTH:  03-17-1933  DATE OF ADMISSION:  06/27/2021  PRIMARY CARE PHYSICIAN: Dale , MD   Patient is coming from: Home  REQUESTING/REFERRING PHYSICIAN: Chesley Noon, MD  CHIEF COMPLAINT:   Chief Complaint  Patient presents with   Chest Pain    HISTORY OF PRESENT ILLNESS:  Tanya Cole is a 87 y.o. female with medical history significant for coronary artery disease status post PCI and stent, HFpEF, asthma, type 2 diabetes mellitus, hypertension, dyslipidemia, and polymyalgia rheumatica as well as reactive airway disease, who presented to the ER with acute onset of midsternal chest pain felt as pressure and tightness and graded 8/10 in severity with no nausea vomiting or diaphoresis or radiation.  She denies any f dyspnea or palpitations.  No leg pain or edema recent travels or surgeries.  She has been having mild cough which has been mainly dry.  No reported wheezing.  No bleeding diathesis.  No dysuria, oliguria or hematuria or flank pain.  ED Course: When she came to the ER blood pressure was 133/57 with otherwise normal vital signs.  Labs revealed a BUN of 25 and creatinine 1.22 slightly higher than previous levels with blood glucose of 153.  High-sensitivity troponin I was 8 and later 10.  CBC showed leukocytosis of 18.  COVID-19 PCR Positive and influenza antigens were negative. EKG as reviewed by me : EKG showed normal sinus rhythm with a rate of 75 Imaging: Chest x-ray showed no acute cardiopulmonary disease.  The patient was given 4 baby aspirin.  I ordered IV remdesivir when COVID-19 PCR came back positive.  She will be admitted to an observation progressive unit bed for further evaluation and management. PAST MEDICAL HISTORY:   Past Medical History:  Diagnosis Date   (HFpEF) heart failure with preserved ejection fraction (HCC)    a. TTE 12/19: EF 55-60%, probable HK of the mid  apical anterior septal myocardium, Gr1DD, mild AI, mildly dilated LA; b.07/2020 Echo: EF 60-65%, no rwma, Gr2 DD. Nl RV fxn. Mildly dil LA. Mild MR.   Asthma    CAD (coronary artery disease)    a. 09/2018 NSTEMI/PCI: LM min irregs, mLAD 95 (PCI/DES), mLAD-2 60%, LCx mild diff dzs, RCA min irregs; b. 03/2020 MV: EF>65%, no ischemia/scar; c. 07/2020 Cath: LM min irregs, LAD 30p, 39m, 90d, D1/2 min irregs, LCX diff dzs throughout, OM1/2/3 mild dzs, RCA 30p, RPDA/RPAV min irrges. EF 55-65%.   CHF (congestive heart failure) (HCC)    Diabetes mellitus (HCC)    Hypercholesterolemia    Hypertension    Myocardial infarction (HCC)    Osteopenia    Palpitations    a. 04/2020 Zio: Avg HR 75. 429 SVT episodes, longest 19 secs @ 133. Occas PACs (3.2%). Rare PVCs (<1%).   Polymyalgia rheumatica syndrome (HCC)    Reactive airway disease     PAST SURGICAL HISTORY:   Past Surgical History:  Procedure Laterality Date   ABDOMINAL HYSTERECTOMY  1981   prolapse and bleeding, ovaries not removed   BREAST EXCISIONAL BIOPSY Right    CHOLECYSTECTOMY N/A 09/02/2019   Procedure: LAPAROSCOPIC CHOLECYSTECTOMY WITH INTRAOPERATIVE CHOLANGIOGRAM;  Surgeon: Henrene Dodge, MD;  Location: [facility] ORS;  Service: General;  Laterality: N/A;   CORONARY STENT INTERVENTION N/A 10/08/2018   Procedure: CORONARY STENT INTERVENTION;  Surgeon: Iran Ouch, MD;  Location: [facility] INVASIVE CV [facility];  Service: Cardiovascular;  Laterality: N/A;  ENDOSCOPIC RETROGRADE CHOLANGIOPANCREATOGRAPHY (ERCP) WITH PROPOFOL N/A 08/08/2019   Procedure: ENDOSCOPIC RETROGRADE CHOLANGIOPANCREATOGRAPHY (ERCP) WITH PROPOFOL;  Surgeon: Midge Minium, MD;  Location: [facility] ENDOSCOPY;  Service: Endoscopy;  Laterality: N/A;   LEFT HEART CATH AND CORONARY ANGIOGRAPHY N/A 10/08/2018   Procedure: LEFT HEART CATH AND CORONARY ANGIOGRAPHY;  Surgeon: Iran Ouch, MD;  Location: [facility] IN[facility] CV [facility];  Service: Cardiovascular;  Laterality: N/A;   LEFT HEART CATH AND  CORONARY ANGIOGRAPHY N/A 08/10/2020   Procedure: LEFT HEART CATH AND CORONARY ANGIOGRAPHY possible percutaneous intervention;  Surgeon: Iran Ouch, MD;  Location: [facility] INV[facility]IVE CV LAB;  Service: Cardiovascular;  Laterality: N/A;   UMBILICAL HERNIA REPAIR  7/94    SOCIAL HISTORY:   Social History   Tobacco Use   Smoking status: Never   Smokeless tobacco: Never  Substance Use Topics   Alcohol use: No    Alcohol/week: 0.0 standard drinks    FAMILY HISTORY:   Family History  Problem Relation Age of Onset   Arthritis Mother    Heart disease Mother    Heart attack Father    Throat cancer Sister    Parkinson's disease Sister    COPD Brother     DRUG ALLERGIES:   Allergies  Allergen Reactions   Tramadol Itching and Nausea And Vomiting    REVIEW OF SYSTEMS:   ROS As per history of present illness. All pertinent systems were reviewed above. Constitutional, HEENT, cardiovascular, respiratory, GI, GU, musculoskeletal, neuro, psychiatric, endocrine, integumentary and hematologic systems were reviewed and are otherwise negative/unremarkable except for positive findings mentioned above in the HPI.   MEDICATIONS AT HOME:   Prior to Admission medications   Medication Sig Start Date End Date Taking? Authorizing Provider  alendronate (FOSAMAX) 70 MG tablet Take 70 mg by mouth once a week.  11/06/17  Yes [provider]  aspirin 81 MG tablet Take 81 mg by mouth daily.   Yes [provider]  dextromethorphan-guaiFENesin (MUCINEX DM) 30-600 MG 12hr tablet Take 1 tablet by mouth 2 (two) times daily as needed for cough. 05/17/21  Yes Kathlen Mody, MD  fluticasone (FLONASE) 50 MCG/ACT nasal spray Use 2 spray(s) in each nostril once daily 08/03/20  Yes Dale [place], MD  fluticasone-salmeterol (ADVAIR) 250-50 MCG/ACT AEPB Inhale 1 puff into the lungs in the morning and at bedtime. 06/09/21  Yes Dale [place], MD  ipratropium-albuterol (DUONEB) 0.5-2.5 (3) MG/3ML SOLN  Take 3 mLs by nebulization 2 (two) times daily. 05/17/21  Yes Kathlen Mody, MD  lisinopril (ZESTRIL) 20 MG tablet Take 1 tablet by mouth once daily 04/05/21  Yes Dunn, Ryan M, PA-C  metFORMIN (GLUCOPHAGE) 500 MG tablet TAKE 1 TABLET BY MOUTH TWICE DAILY WITH A MEAL 09/29/20  Yes Dale [place], MD  metoprolol tartrate (LOPRESSOR) 25 MG tablet Take 1 tablet by mouth twice daily 05/11/21  Yes Iran Ouch, MD  ondansetron (ZOFRAN ODT) 4 MG disintegrating tablet Take 1 tablet (4 mg total) by mouth every 8 (eight) hours as needed for nausea or vomiting. 05/24/21  Yes Shaune Pollack, MD  PROAIR HFA 108 [phone] Base) MCG/ACT inhaler Inhale 2 puffs into the lungs every 4 (four) hours as needed for wheezing or shortness of breath. 08/20/20  Yes Rolly Salter, MD  Vitamin D, Ergocalciferol, (DRISDOL) 50000 units CAPS capsule Take 50,000 Units by mouth once a week. 06/24/18  Yes [provider]  atorvastatin (LIPITOR) 80 MG tablet TAKE 1 TABLET BY MOUTH ONCE DAILY AT  [facility] 05/04/21   Dan Humphreys,  Storm Frisk, NP  nitroGLYCERIN (NITROSTAT) 0.4 MG SL tablet Place 1 tablet (0.4 mg total) under the tongue every 5 (five) minutes as needed for chest pain. 10/09/18   Adrian Saran, MD      VITAL SIGNS:  Blood pressure (!) 118/45, pulse 82, temperature 99.9 F (37.7 C), temperature source Oral, resp. rate (!) 23, height 5\' 1"  (1.549 m), weight 53.5 kg, SpO2 95 %.  PHYSICAL EXAMINATION:  Physical Exam  GENERAL:  87 y.o.-year-old Caucasian female patient lying in the bed with no acute distress.  EYES: Pupils equal, round, reactive to light and accommodation. No scleral icterus. Extraocular muscles intact.  HEENT: Head atraumatic, normocephalic. Oropharynx and nasopharynx clear.  NECK:  Supple, no jugular venous distention. No thyroid enlargement, no tenderness.  LUNGS: Normal breath sounds bilaterally, no wheezing, rales,rhonchi or crepitation. No use of accessory muscles of respiration.  CARDIOVASCULAR: Regular  rate and rhythm, S1, S2 normal. No murmurs, rubs, or gallops.  ABDOMEN: Soft, nondistended, nontender. Bowel sounds present. No organomegaly or mass.  EXTREMITIES: No pedal edema, cyanosis, or clubbing.  NEUROLOGIC: Cranial nerves II through XII are intact. Muscle strength 5/5 in all extremities. Sensation intact. Gait not checked.  PSYCHIATRIC: The patient is alert and oriented x 3.  Normal affect and good eye contact. SKIN: No obvious rash, lesion, or ulcer.   LABORATORY PANEL:   CBC Recent Labs  Lab 06/26/21 1951  WBC 18.0*  HGB 12.9  HCT 38.5  PLT 258   ------------------------------------------------------------------------------------------------------------------  Chemistries  Recent Labs  Lab 06/26/21 1951  NA 136  K 3.7  CL 102  CO2 23  GLUCOSE 153*  BUN 25*  CREATININE 1.22*  CALCIUM 8.8*   ------------------------------------------------------------------------------------------------------------------  Cardiac Enzymes No results for input(s): TROPONINI in the last 168 hours. ------------------------------------------------------------------------------------------------------------------  RADIOLOGY:  DG Chest 2 View  Result Date: 06/26/2021 CLINICAL DATA:  Chest pain. EXAM: CHEST - 2 VIEW COMPARISON:  Chest radiograph dated 05/24/2021 FINDINGS: Bibasilar interstitial coarsening and atelectasis/scarring. No focal consolidation, pleural effusion, pneumothorax the cardiac silhouette is within limits. Atherosclerotic calcification of the aorta. Osteopenia with degenerative changes of the spine. No acute osseous pathology. IMPRESSION: No active cardiopulmonary disease. Electronically Signed   By: 05/26/2021 M.D.   On: 06/26/2021 20:14      IMPRESSION AND PLAN:  Active Problems:   Chest pain  1.  Chest pain, rule out acute coronary syndrome with history of coronary artery disease status post PCI and stent. - The patient will be admitted to a progressive  unit observation bed. - We will follow serial troponin I's. - The patient will be placed on aspirin as well as as needed sublingual nitroglycerin and morphine sulfate for pain. - Cardiology consult to be obtained. - I notified Dr. 06/28/2021 about the patient.  2.  COVID-19 infection. - The patient will be placed on IV remdesivir. - We will place her on vitamin C, vitamin D3, zinc sulfate and aspirin. - Mucolytic therapy will be provided.  3.  Mild acute kidney injury. - The patient will be placed on hydration with IV normal saline and will follow BMPs. - We will avoid nephrotoxins.  Avoid if  4.  Dyslipidemia. - We will continue statin therapy.  5.  Essential hypertension. - We will continue Lopressor and hold off Zestril given mild acute kidney injury.  6.  Type 2 diabetes mellitus. - We will place the patient on supplement coverage with NovoLog and hold off metformin.  7.  Asthma. - I will hold  off Advair Diskus and place the patient on as needed DuoNebs.   DVT prophylaxis: Lovenox.  Code Status: The patient is DNR/DNI.   Family Communication:  The plan of care was discussed in details with the patient (and family). I answered all questions. The patient agreed to proceed with the above mentioned plan. Further management will depend upon hospital course. Disposition Plan: Back to previous home environment Consults called: Cardiology consult. All the records are reviewed and case discussed with ED provider.  Status is: Observation  The patient remains OBS appropriate and will d/c before 2 midnights.  Dispo: The patient is from: Home              Anticipated d/c is to: Home              Patient currently is not medically stable to d/c.   Difficult to place patient No   TOTAL TIME TAKING CARE OF THIS PATIENT: 55 minutes.    Hannah Beat M.D on 06/27/2021 at 2:23 AM  [facility]   From 7 PM-7 AM, contact night-coverage [email or web address]  CC: Primary care physician;  Dale [place], MD

## 2021-06-27 NOTE — ED Notes (Addendum)
Assumed care of patient, placed on cardiac monitor. Pt noted to have drainage from both eyes with congestion & slight increased WOB, LCTA. She states chest pain has resolved. Call light in reach, daughter at bedside.

## 2021-06-27 NOTE — ED Notes (Signed)
Pt given meal tray.

## 2021-06-27 NOTE — ED Provider Notes (Signed)
[facility] Emergency Department Provider Note   ____________________________________________   Event Date/Time   First MD Initiated Contact with Patient 06/27/21 0040     (approximate)  I have reviewed the triage vital signs and the nursing notes.   HISTORY  Chief Complaint Chest Pain    HPI Tanya Cole is a 87 y.o. female with past medical history of hypertension, hyperlipidemia, diabetes, CAD, COPD, CHF, and polymyalgia rheumatica who presents to the ED complaining of chest pain.  Patient reports that she has been dealing with intermittent pressure in her chest since about 4 PM this afternoon.  Pressure seems to be worse with exertion but can also occur at rest.  She denies any associated fevers, cough, shortness of breath, nausea, or vomiting.  She does not have any pain in her chest currently.  She has not noticed any pain or swelling in her legs recently.        Past Medical History:  Diagnosis Date   (HFpEF) heart failure with preserved ejection fraction (HCC)    a. TTE 12/19: EF 55-60%, probable HK of the mid apical anterior septal myocardium, Gr1DD, mild AI, mildly dilated LA; b.07/2020 Echo: EF 60-65%, no rwma, Gr2 DD. Nl RV fxn. Mildly dil LA. Mild MR.   Asthma    CAD (coronary artery disease)    a. 09/2018 NSTEMI/PCI: LM min irregs, mLAD 95 (PCI/DES), mLAD-2 60%, LCx mild diff dzs, RCA min irregs; b. 03/2020 MV: EF>65%, no ischemia/scar; c. 07/2020 Cath: LM min irregs, LAD 30p, 38m, 90d, D1/2 min irregs, LCX diff dzs throughout, OM1/2/3 mild dzs, RCA 30p, RPDA/RPAV min irrges. EF 55-65%.   CHF (congestive heart failure) (HCC)    Diabetes mellitus (HCC)    Hypercholesterolemia    Hypertension    Myocardial infarction (HCC)    Osteopenia    Palpitations    a. 04/2020 Zio: Avg HR 75. 429 SVT episodes, longest 19 secs @ 133. Occas PACs (3.2%). Rare PVCs (<1%).   Polymyalgia rheumatica syndrome (HCC)    Reactive airway disease      Patient Active Problem List   Diagnosis Date Noted   COPD (chronic obstructive pulmonary disease) (HCC) 05/21/2021   Hypomagnesemia 05/14/2021   CAD (coronary artery disease)    Severe sepsis (HCC)    Hypokalemia    Acute on chronic respiratory failure with hypoxia (HCC)    Asthma exacerbation    Joint ache 04/09/2021   Calcified granuloma of lung (HCC) 03/02/2021   Acute exacerbation of chronic obstructive pulmonary disease (COPD) (HCC) 03/01/2021   COPD exacerbation (HCC) 11/29/2020   Chronic diastolic CHF (congestive heart failure) (HCC) 11/29/2020   CAP (community acquired pneumonia) 11/29/2020   Rib pain on right side 10/19/2020   Abnormal CXR 10/19/2020   Type 2 diabetes mellitus with cardiac complication (HCC) 10/08/2020   Elevated troponin 10/08/2020   Acute on chronic heart failure with preserved ejection fraction (HFpEF) (HCC) 10/08/2020   COPD with acute exacerbation (HCC) 08/19/2020   History of non-ST elevation myocardial infarction (NSTEMI) 08/07/2020   Asthma 08/07/2020   Leukocytosis 08/07/2020   Left shoulder pain 07/12/2020   Iron deficiency 04/16/2020   Anemia 04/04/2020   SOB (shortness of breath) 03/25/2020   Cough 09/30/2019   Gallstone pancreatitis    Pre-op evaluation 08/30/2019   Cholecystitis 08/05/2019   Choledocholithiasis    Respiratory illness 06/18/2019   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) 12/29/2018   Chest pain 10/07/2018  Memory change 05/27/2018   Osteoporosis 02/05/2018   Weight loss 06/24/2017   Abdominal pain, left lower quadrant 10/05/2016   Long term current use of systemic steroids 05/16/2016   Elevated erythrocyte sedimentation rate 05/04/2016   Back pain 04/29/2016   Fatigue 05/24/2015   Health care maintenance 01/25/2015   UTI (urinary tract infection) 07/07/2014   Neuropathy 03/24/2014   Diverticulitis 02/24/2013   Reactive airway disease 09/15/2012   Hypertension 09/14/2012    Hypercholesterolemia 09/14/2012   Diabetes mellitus with cardiac complication (HCC) 09/14/2012    Past Surgical History:  Procedure Laterality Date   ABDOMINAL HYSTERECTOMY  1981   prolapse and bleeding, ovaries not removed   BREAST EXCISIONAL BIOPSY Right    CHOLECYSTECTOMY N/A 09/02/2019   Procedure: LAPAROSCOPIC CHOLECYSTECTOMY WITH INTRAOPERATIVE CHOLANGIOGRAM;  Surgeon: Henrene Dodge, MD;  Location: [facility] ORS;  Service: General;  Laterality: N/A;   CORONARY STENT INTERVENTION N/A 10/08/2018   Procedure: CORONARY STENT INTERVENTION;  Surgeon: Iran Ouch, MD;  Location: [facility] INV[facility] CV [facility];  Service: Cardiovascular;  Laterality: N/A;   ENDOSCOPIC RETROGRADE CHOLANGIOPANCREATOGRAPHY (ERCP) WITH PROPOFOL N/A 08/08/2019   Procedure: ENDOSCOPIC RETROGRADE CHOLANGIOPANCREATOGRAPHY (ERCP) WITH PROPOFOL;  Surgeon: Midge Minium, MD;  Location: [facility] ENDOSCOPY;  Service: Endoscopy;  Laterality: N/A;   LEFT HEART CATH AND CORONARY ANGIOGRAPHY N/A 10/08/2018   Procedure: LEFT HEART CATH AND CORONARY ANGIOGRAPHY;  Surgeon: Iran Ouch, MD;  Location: [facility] INV[facility];  Service: Cardiovascular;  Laterality: N/A;   LEFT HEART CATH AND CORONARY ANGIOGRAPHY N/A 08/10/2020   Procedure: LEFT HEART CATH AND CORONARY ANGIOGRAPHY possible percutaneous intervention;  Surgeon: Iran Ouch, MD;  Location: [facility] INV[facility];  Service: Cardiovascular;  Laterality: N/A;   UMBILICAL HERNIA REPAIR  7/94    Prior to Admission medications   Medication Sig Start Date End Date Taking? Authorizing Provider  alendronate (FOSAMAX) 70 MG tablet Take 70 mg by mouth once a week.  11/06/17   [provider]  aspirin 81 MG tablet Take 81 mg by mouth daily.    [provider]  atorvastatin (LIPITOR) 80 MG tablet TAKE 1 TABLET BY MOUTH ONCE DAILY AT  6PM 05/04/21   Alver Sorrow, NP  dextromethorphan-guaiFENesin [facility] DM) 30-600 MG 12hr tablet Take 1 tablet by mouth 2 (two) times daily  as needed for cough. 05/17/21   Kathlen Mody, MD  fluticasone (FLONASE) 50 MCG/ACT nasal spray Use 2 spray(s) in each nostril once daily 08/03/20   Dale [place], MD  fluticasone-salmeterol (ADVAIR) 250-50 MCG/ACT AEPB Inhale 1 puff into the lungs in the morning and at bedtime. 06/09/21   Dale [place], MD  ipratropium-albuterol (DUONEB) 0.5-2.5 (3) MG/3ML SOLN Take 3 mLs by nebulization 2 (two) times daily. 05/17/21   Kathlen Mody, MD  lisinopril (ZESTRIL) 20 MG tablet Take 1 tablet by mouth once daily 04/05/21   Dunn, Raymon [place], PA-C  metFORMIN (GLUCOPHAGE) 500 MG tablet TAKE 1 TABLET BY MOUTH TWICE DAILY WITH A MEAL 09/29/20   Dale [place], MD  metoprolol tartrate (LOPRESSOR) 25 MG tablet Take 1 tablet by mouth twice daily 05/11/21   Iran Ouch, MD  nitroGLYCERIN (NITROSTAT) 0.4 MG SL tablet Place 1 tablet (0.4 mg total) under the tongue every 5 (five) minutes as needed for chest pain. 10/09/18   Adrian Saran, MD  ondansetron (ZOFRAN ODT) 4 MG disintegrating tablet Take 1 tablet (4 mg total) by mouth every 8 (eight) hours as needed for nausea or vomiting. 05/24/21   Shaune Pollack, MD  PROAIR HFA 108 (90 Base) MCG/ACT inhaler Inhale 2 puffs into the lungs every 4 (four) hours as needed for wheezing or shortness of breath. 08/20/20   Rolly Salter, MD  Vitamin D, Ergocalciferol, (DRISDOL) 50000 units CAPS capsule Take 50,000 Units by mouth once a week. 06/24/18   [provider]    Allergies Tramadol  Family History  Problem Relation Age of Onset   Arthritis Mother    Heart disease Mother    Heart attack Father    Throat cancer Sister    Parkinson's disease Sister    COPD Brother     Social History Social History   Tobacco Use   Smoking status: Never   Smokeless tobacco: Never  Vaping Use   Vaping Use: Never used  Substance Use Topics   Alcohol use: No    Alcohol/week: 0.0 standard drinks   Drug use: No    Review of Systems  Constitutional: No  fever/chills Eyes: No visual changes. ENT: No sore throat. Cardiovascular: Positive for chest pain. Respiratory: Denies shortness of breath. Gastrointestinal: No abdominal pain.  No nausea, no vomiting.  No diarrhea.  No constipation. Genitourinary: Negative for dysuria. Musculoskeletal: Negative for back pain. Skin: Negative for rash. Neurological: Negative for headaches, focal weakness or numbness.  ____________________________________________   PHYSICAL EXAM:  VITAL SIGNS: ED Triage Vitals  Enc Vitals Group     BP 06/26/21 1945 (!) 133/57     Pulse Rate 06/26/21 1945 74     Resp 06/26/21 1945 20     Temp 06/26/21 1945 99.9 F (37.7 C)     Temp Source 06/26/21 1945 Oral     SpO2 06/26/21 1945 96 %     Weight 06/26/21 1946 118 lb (53.5 kg)     Height 06/26/21 1946 5\' 1"  (1.549 m)     Head Circumference --      Peak Flow --      Pain Score 06/26/21 1952 7     Pain Loc --      Pain Edu? --      Excl. in GC? --     Constitutional: Alert and oriented. Eyes: Conjunctivae are normal. Head: Atraumatic. Nose: No congestion/rhinnorhea. Mouth/Throat: Mucous membranes are moist. Neck: Normal ROM Cardiovascular: Normal rate, regular rhythm. Grossly normal heart sounds.  2+ radial pulses bilaterally. Respiratory: Normal respiratory effort.  No retractions. Lungs CTAB.  No chest wall tenderness to palpation. Gastrointestinal: Soft and nontender. No distention. Genitourinary: deferred Musculoskeletal: No lower extremity tenderness nor edema. Neurologic:  Normal speech and language. No gross focal neurologic deficits are appreciated. Skin:  Skin is warm, dry and intact. No rash noted. Psychiatric: Mood and affect are normal. Speech and behavior are normal.  ____________________________________________   LABS (all labs ordered are listed, but only abnormal results are displayed)  Labs Reviewed  BASIC METABOLIC PANEL - Abnormal; Notable for the following components:       Result Value   Glucose, Bld 153 (*)    BUN 25 (*)    Creatinine, Ser 1.22 (*)    Calcium 8.8 (*)    GFR, Estimated 43 (*)    All other components within normal limits  CBC - Abnormal; Notable for the following components:   WBC 18.0 (*)    All other components within normal limits  RESP PANEL BY RT-PCR (FLU A&B, COVID) [id number]  TROPONIN I (HIGH SENSITIVITY)  TROPONIN I (HIGH SENSITIVITY)   ____________________________________________  EKG  ED ECG REPORT I, Chesley Noon, the  attending physician, personally viewed and interpreted this ECG.   Date: 06/27/2021  EKG Time: 19:45  Rate: 75  Rhythm: normal sinus rhythm  Axis: Normal  Intervals:none  ST&T Change: None   PROCEDURES  Procedure(s) performed (including Critical Care):  Procedures   ____________________________________________   INITIAL IMPRESSION / ASSESSMENT AND PLAN / ED COURSE      87 year old female with past medical history of hypertension, hyperlipidemia, diabetes, CAD, COPD, CHF, and polymyalgia rheumatica who presents to the ED complaining of intermittent chest pressure since this afternoon that is worse with exertion but can also occur at rest.  Patient currently denies any pain in her chest, EKG shows no evidence of arrhythmia or ischemia and 2 sets of troponin are negative.  Additional labs are remarkable only for mild AKI, chest x-ray reviewed by me and shows no infiltrate, edema, or effusion.  Given she is currently asymptomatic, I doubt PE or dissection, however symptoms are concerning for unstable angina.  We will load with aspirin, plan to discuss with hospitalist for admission.      ____________________________________________   FINAL CLINICAL IMPRESSION(S) / ED DIAGNOSES  Final diagnoses:  Nonspecific chest pain  Coronary artery disease involving native heart, unspecified vessel or lesion type, unspecified whether angina present     ED Discharge Orders     None        Note:   This document was prepared using Dragon voice recognition software and may include unintentional dictation errors.    Chesley Noon, MD 06/27/21 [id number]

## 2021-06-27 NOTE — ED Notes (Signed)
Patient transported to CT 

## 2021-06-27 NOTE — ED Notes (Addendum)
Replaced purewick on pt. Daughter at bedside.

## 2021-06-27 NOTE — ED Notes (Addendum)
Dr. Arville Care notified of positive covid result. No new verbal orders received.

## 2021-06-27 NOTE — ED Notes (Signed)
Pt assisted to bedside commode and dietary called in regard to missing breakfast tray

## 2021-06-27 NOTE — Progress Notes (Signed)
PHARMACIST - PHYSICIAN COMMUNICATION  CONCERNING: P&T Medication Policy Regarding Oral Bisphosphonates  RECOMMENDATION: Your order for alendronate (Fosamax), ibandronate (Boniva), or risedronate (Actonel) has been discontinued at this time.  If the patient's post-hospital medical condition warrants safe use of this class of drugs, please resume the pre-hospital regimen upon discharge.  DESCRIPTION:  Alendronate (Fosamax), ibandronate (Boniva), and risedronate (Actonel) can cause severe esophageal erosions in patients who are unable to remain upright at least 30 minutes after taking this medication.   Since brief interruptions in therapy are thought to have minimal impact on bone mineral density, the Pharmacy & Therapeutics Committee has established that bisphosphonate orders should be routinely discontinued during hospitalization.   To override this safety policy and permit administration of Boniva, Fosamax, or Actonel in the hospital, prescribers must write "DO NOT HOLD" in the comments section when placing the order for this class of medications.  Otelia Sergeant, PharmD, [facility] 06/27/2021 2:32 AM

## 2021-06-27 NOTE — ED Notes (Signed)
Pt assisted to bedside commode

## 2021-06-27 NOTE — Consult Note (Addendum)
Cardiology Consultation:   Due to the COVID-19 pandemic, this visit was completed with telemedicine (audio/video) technology to reduce patient and provider exposure as well as to preserve personal protective equipment.   [name] MRN: [id number]; DOB: 1933/07/25  Admit date: 06/27/2021 Date of Consult: 06/27/2021  Primary Care Provider: Dale [place], MD Primary Cardiologist: Lorine Bears, MD  Primary Electrophysiologist:  None    Patient Profile:   Tanya Cole is a 87 y.o. female with a hx of with a hx of HFpEF, CAD s/p DES mLAD 2019 , diabetes, HTN, HLD, palpitations, polymyalgia rheumatica, asthma/COPD who is being seen 06/27/2021 for the evaluation of chest pain at the request of Dr. Denton Lank.  History of Present Illness:   Tanya Cole is followed by Dr. Kirke Corin for the above cardiac issues. She had a NSTEMI in December 2019 and underwent PCI and DES placement to the mLAD. In early 2021 she was diagnosed with blood loss anemia with Hgb to 8.4 requiring discontinuation of plavix. In October 2021 she suffered a NSTEMI. Catheterization showed patent proximal LAD stent with mild ISR, 60% mid LAD stenosis, and 90% apical LAD stenosis. The apical LAD stenosis was felt to be the culprit but was not amenable to PCI given distal location and medical therapy was recommended. A few weeks later, she returned to the ER with chest pain and dyspnea, troponin was normal. She was noted to have intermittent episodes of atrial tachycardia and frequent PACs requiring adjustment of BB dose.   She lives by herself and can take care of herself. No smoking, tobacco, alcohol, drug history. Eating and drinking well.   Last seen 05/21/21 and was stable form a cardiac perspective.   The patient came to the ER 06/26/21 for chest pain. Chest pain started yesterday morning. She was not doing anything when they came one. In the center of the chest, radiate to the whole chest. Not worse with  exertion. Worse with taking a deep breath in. Described as a pressure. Pain 8/10. Not sure if this is similar to prior MI. Reported associated shortness of breath and diaphoresis. No nausea or vomiting. No LLE, orthopnea, pnd.   In the ER BP 133/57, pulse 74, RR 20, 99.9 F, 96%O2. Labs show glucose 153, Scr 1.22, BUN 25, WBC 18, Hgb 12.9. HS trop 8>10>16. BNP 42. D-dimer 0.82, COVID positive. CXR unremarkable. EK with NSR and no new ischemic changes. CTA chest showed no PE, possible RLL infection/inflammation. Patient was given 4 baby aspirin. She was admitted for further work-up.   Past Medical History:  Diagnosis Date   (HFpEF) heart failure with preserved ejection fraction (HCC)    a. TTE 12/19: EF 55-60%, probable HK of the mid apical anterior septal myocardium, Gr1DD, mild AI, mildly dilated LA; b.07/2020 Echo: EF 60-65%, no rwma, Gr2 DD. Nl RV fxn. Mildly dil LA. Mild MR.   Asthma    CAD (coronary artery disease)    a. 09/2018 NSTEMI/PCI: LM min irregs, mLAD 95 (PCI/DES), mLAD-2 60%, LCx mild diff dzs, RCA min irregs; b. 03/2020 MV: EF>65%, no ischemia/scar; c. 07/2020 Cath: LM min irregs, LAD 30p, 1m, 90d, D1/2 min irregs, LCX diff dzs throughout, OM1/2/3 mild dzs, RCA 30p, RPDA/RPAV min irrges. EF 55-65%.   CHF (congestive heart failure) (HCC)    Diabetes mellitus (HCC)    Hypercholesterolemia    Hypertension    Myocardial infarction (HCC)    Osteopenia    Palpitations    a. 04/2020 Zio: Avg  HR 75. 429 SVT episodes, longest 19 secs @ 133. Occas PACs (3.2%). Rare PVCs (<1%).   Polymyalgia rheumatica syndrome (HCC)    Reactive airway disease     Past Surgical History:  Procedure Laterality Date   ABDOMINAL HYSTERECTOMY  1981   prolapse and bleeding, ovaries not removed   BREAST EXCISIONAL BIOPSY Right    CHOLECYSTECTOMY N/A 09/02/2019   Procedure: LAPAROSCOPIC CHOLECYSTECTOMY WITH INTRAOPERATIVE CHOLANGIOGRAM;  Surgeon: Henrene Dodge, MD;  Location: [facility] ORS;  Service: General;   Laterality: N/A;   CORONARY STENT INTERVENTION N/A 10/08/2018   Procedure: CORONARY STENT INTERVENTION;  Surgeon: Iran Ouch, MD;  Location: [facility] INVASIVE CV LAB;  Service: Cardiovascular;  Laterality: N/A;   ENDOSCOPIC RETROGRADE CHOLANGIOPANCREATOGRAPHY (ERCP) WITH PROPOFOL N/A 08/08/2019   Procedure: ENDOSCOPIC RETROGRADE CHOLANGIOPANCREATOGRAPHY (ERCP) WITH PROPOFOL;  Surgeon: Midge Minium, MD;  Location: [facility] ENDOSCOPY;  Service: Endoscopy;  Laterality: N/A;   LEFT HEART CATH AND CORONARY ANGIOGRAPHY N/A 10/08/2018   Procedure: LEFT HEART CATH AND CORONARY ANGIOGRAPHY;  Surgeon: Iran Ouch, MD;  Location: [facility] INV[facility]IVE CV [facility];  Service: Cardiovascular;  Laterality: N/A;   LEFT HEART CATH AND CORONARY ANGIOGRAPHY N/A 08/10/2020   Procedure: LEFT HEART CATH AND CORONARY ANGIOGRAPHY possible percutaneous intervention;  Surgeon: Iran Ouch, MD;  Location: [facility] INVASIVE CV LAB;  Service: Cardiovascular;  Laterality: N/A;   UMBILICAL HERNIA REPAIR  7/94     Home Medications:  Prior to Admission medications   Medication Sig Start Date End Date Taking? Authorizing Provider  alendronate (FOSAMAX) 70 MG tablet Take 70 mg by mouth once a week.  11/06/17  Yes [provider]  aspirin 81 MG tablet Take 81 mg by mouth daily.   Yes [provider]  dextromethorphan-guaiFENesin (MUCINEX DM) 30-600 MG 12hr tablet Take 1 tablet by mouth 2 (two) times daily as needed for cough. 05/17/21  Yes Kathlen Mody, MD  fluticasone (FLONASE) 50 MCG/ACT nasal spray Use 2 spray(s) in each nostril once daily 08/03/20  Yes Dale Lindon, MD  fluticasone-salmeterol (ADVAIR) 250-50 MCG/ACT AEPB Inhale 1 puff into the lungs in the morning and at bedtime. 06/09/21  Yes Dale Wade, MD  ipratropium-albuterol (DUONEB) 0.5-2.5 (3) MG/3ML SOLN Take 3 mLs by nebulization 2 (two) times daily. 05/17/21  Yes Kathlen Mody, MD  lisinopril (ZESTRIL) 20 MG tablet Take 1 tablet by mouth once daily  04/05/21  Yes Dunn, Ryan M, PA-C  metFORMIN (GLUCOPHAGE) 500 MG tablet TAKE 1 TABLET BY MOUTH TWICE DAILY WITH A MEAL 09/29/20  Yes Dale Theodosia, MD  metoprolol tartrate (LOPRESSOR) 25 MG tablet Take 1 tablet by mouth twice daily 05/11/21  Yes Iran Ouch, MD  ondansetron (ZOFRAN ODT) 4 MG disintegrating tablet Take 1 tablet (4 mg total) by mouth every 8 (eight) hours as needed for nausea or vomiting. 05/24/21  Yes Shaune Pollack, MD  PROAIR HFA 108 [phone] Base) MCG/ACT inhaler Inhale 2 puffs into the lungs every 4 (four) hours as needed for wheezing or shortness of breath. 08/20/20  Yes Rolly Salter, MD  Vitamin D, Ergocalciferol, (DRISDOL) 50000 units CAPS capsule Take 50,000 Units by mouth once a week. 06/24/18  Yes [provider]  atorvastatin (LIPITOR) 80 MG tablet TAKE 1 TABLET BY MOUTH ONCE DAILY AT  6PM 05/04/21   Alver Sorrow, NP  nitroGLYCERIN (NITROSTAT) 0.4 MG SL tablet Place 1 tablet (0.4 mg total) under the tongue every 5 (five) minutes as needed for chest pain. 10/09/18   Adrian Saran, MD  Inpatient Medications: Scheduled Meds:  alum & mag hydroxide-simeth  30 mL Oral Once   And   lidocaine  15 mL Oral Once   vitamin C  500 mg Oral Daily   aspirin EC  81 mg Oral Daily   atorvastatin  80 mg Oral QPM   cholecalciferol  1,000 Units Oral Daily   enoxaparin (LOVENOX) injection  30 mg Subcutaneous Q24H   fluticasone  2 spray Each Nare Daily   guaiFENesin  600 mg Oral BID   ipratropium-albuterol  3 mL Nebulization BID   lisinopril  20 mg Oral Daily   metoprolol tartrate  25 mg Oral BID   Vitamin D (Ergocalciferol)  50,000 Units Oral Weekly   zinc sulfate  220 mg Oral Daily   Continuous Infusions:  sodium chloride Stopped (06/27/21 0437)   [START ON 06/28/2021] remdesivir 100 mg in NS 100 mL     PRN Meds: acetaminophen, albuterol, ALPRAZolam, chlorpheniramine-HYDROcodone, guaiFENesin-dextromethorphan, magnesium hydroxide, morphine injection, nitroGLYCERIN,  ondansetron (ZOFRAN) IV, ondansetron, zolpidem  Allergies:    Allergies  Allergen Reactions   Tramadol Itching and Nausea And Vomiting    Social History:   Social History   Socioeconomic History   Marital status: Widowed    Spouse name: Not on file   Number of children: 3   Years of education: Not on file   Highest education level: Not on file  Occupational History   Not on file  Tobacco Use   Smoking status: Never   Smokeless tobacco: Never  Vaping Use   Vaping Use: Never used  Substance and Sexual Activity   Alcohol use: No    Alcohol/week: 0.0 standard drinks   Drug use: No   Sexual activity: Not Currently  Other Topics Concern   Not on file  Social History Narrative   No smoking; no alcohol; in Section; worked in Designer, fashion/clothing. Lives by self in [place]. Does all of her own housework. Dtr does food shopping for her.   Social Determinants of Health   Financial Resource Strain: Low Risk    Difficulty of Paying Living Expenses: Not hard at all  Food Insecurity: No Food Insecurity   Worried About Programme researcher, broadcasting/film/video in the Last Year: Never true   Ran Out of Food in the Last Year: Never true  Transportation Needs: No Transportation Needs   Lack of Transportation (Medical): No   Lack of Transportation (Non-Medical): No  Physical Activity: Not on file  Stress: No Stress Concern Present   Feeling of Stress : Not at all  Social Connections: Moderately Integrated   Frequency of Communication with Friends and Family: More than three times a week   Frequency of Social Gatherings with Friends and Family: More than three times a week   Attends Religious Services: More than 4 times per year   Active Member of [employer] or Organizations: Yes   Attends Banker Meetings: Not on file   Marital Status: Widowed  Catering manager Violence: Not At Risk   Fear of Current or Ex-Partner: No   Emotionally Abused: No   Physically Abused: No   Sexually Abused: No    Family  History:    Family History  Problem Relation Age of Onset   Arthritis Mother    Heart disease Mother    Heart attack Father    Throat cancer Sister    Parkinson's disease Sister    COPD Brother      ROS:  Please see the history of present  illness.   All other ROS reviewed and negative.     Physical Exam/Data:   Vitals:   06/27/21 0600 06/27/21 0630 06/27/21 0700 06/27/21 0730  BP: (!) 114/53 (!) 118/47 (!) 115/55 (!) 129/56  Pulse: 70 97 98 76  Resp: (!) 22 (!) 28 (!) 26 (!) 25  Temp:      TempSrc:      SpO2: 95% 96% 95% 99%  Weight:      Height:       No intake or output data in the 24 hours ending 06/27/21 0745 Last 3 Weights 06/26/2021 06/09/2021 05/24/2021  Weight (lbs) 118 lb 123 lb 3.2 oz 121 lb 14.6 oz  Weight (kg) 53.524 kg 55.883 kg 55.3 kg     Body mass index is 22.3 kg/m.   VITAL SIGNS:  reviewed  EKG:  The EKG was personally reviewed and demonstrates:   NSR, 75bpm, nonspecific T wave changes. No change Telemetry:  Telemetry was personally reviewed and demonstrates:  NSR HR 80-90s, PACs  Relevant CV Studies:  LHC 08/10/20 Mid LAD lesion is 60% stenosed. Prox LAD lesion is 30% stenosed. Dist LAD lesion is 90% stenosed. Prox RCA lesion is 30% stenosed. The left ventricular systolic function is normal. LV end diastolic pressure is mildly elevated. The left ventricular ejection fraction is 55-65% by visual estimate.   1.  Significant one-vessel coronary artery disease with patent proximal LAD stent with mild in-stent restenosis.  Stable mid LAD stenosis at 60% and significant distal LAD stenosis 90% close to the apex. 2.  Normal LV systolic function mildly elevated left ventricular end-diastolic pressure.   Recommendations: Continue medical therapy.  Distal LAD stenosis is close to the apex and not amenable to PCI.   Coronary Diagrams  Diagnostic Dominance: Right    Echo 07/2020   1. Left ventricular ejection fraction, by estimation, is 60 to  65%. The  left ventricle has normal function. The left ventricle has no regional  wall motion abnormalities. Left ventricular diastolic parameters are  consistent with Grade II diastolic  dysfunction (pseudonormalization).   2. Right ventricular systolic function is normal. The right ventricular  size is normal. Tricuspid regurgitation signal is inadequate for assessing  PA pressure.   3. Left atrial size was mildly dilated.   4. Mild mitral valve regurgitation.   Laboratory Data:  Chemistry Recent Labs  Lab 06/26/21 1951  NA 136  K 3.7  CL 102  CO2 23  GLUCOSE 153*  BUN 25*  CREATININE 1.22*  CALCIUM 8.8*  GFRNONAA 43*  ANIONGAP 11    No results for input(s): PROT, ALBUMIN, AST, ALT, ALKPHOS, BILITOT in the last 168 hours. Hematology Recent Labs  Lab 06/22/21 1448 06/26/21 1951  WBC 10.4 18.0*  RBC 4.50 4.29  HGB 13.0 12.9  HCT 39.3 38.5  MCV 87.2 89.7  MCH  --  30.1  MCHC 33.1 33.5  RDW 14.7 14.0  PLT 287.0 258   Cardiac EnzymesNo results for input(s): TROPONINI in the last 168 hours. No results for input(s): TROPIPOC in the last 168 hours.  BNP Recent Labs  Lab 06/27/21 0531  BNP 42.3    DDimer  Recent Labs  Lab 06/27/21 0531  DDIMER 0.82*    Radiology/Studies:  DG Chest 2 View  Result Date: 06/26/2021 CLINICAL DATA:  Chest pain. EXAM: CHEST - 2 VIEW COMPARISON:  Chest radiograph dated 05/24/2021 FINDINGS: Bibasilar interstitial coarsening and atelectasis/scarring. No focal consolidation, pleural effusion, pneumothorax the cardiac silhouette is within limits. Atherosclerotic  calcification of the aorta. Osteopenia with degenerative changes of the spine. No acute osseous pathology. IMPRESSION: No active cardiopulmonary disease. Electronically Signed   By: Elgie Collard M.D.   On: 06/26/2021 20:14   CT Angio Chest Pulmonary Embolism (PE) W or WO Contrast  Result Date: 06/27/2021 CLINICAL DATA:  Shortness of breath. EXAM: CT ANGIOGRAPHY CHEST WITH  CONTRAST TECHNIQUE: Multidetector CT imaging of the chest was performed using the standard protocol during bolus administration of intravenous contrast. Multiplanar CT image reconstructions and MIPs were obtained to evaluate the vascular anatomy. CONTRAST:  75mL OMNIPAQUE IOHEXOL 350 MG/ML SOLN COMPARISON:  08/19/2020 FINDINGS: Cardiovascular: Satisfactory opacification of the pulmonary arteries to the segmental level. No evidence of pulmonary embolism. Normal heart size. No pericardial effusion. Aortic atherosclerosis and coronary artery calcifications. Mediastinum/Nodes: Normal appearance of the thyroid gland. The trachea appears patent and is midline. Small hiatal hernia. No mediastinal, supraclavicular, or axillary adenopathy. Calcified right hilar lymph nodes identified. No pleural effusion identified. Lungs/Pleura: There is new right lower lobe atelectasis with thickening of the peribronchovascular interstitium and peripheral subpleural areas of nodular consolidation, likely secondary to infection/inflammation. Areas of subsegmental atelectasis noted within the lingula and posterior left base. Calcified granuloma identified within the left lower lobe. Upper Abdomen: Status post cholecystectomy. Tiny area of pneumobilia noted within the left hepatic lobe. Aortic atherosclerosis. Calcified granuloma in the spleen. Musculoskeletal: Spondylosis identified within the thoracic spine. No acute findings. Review of the MIP images confirms the above findings. IMPRESSION: 1. No evidence for acute pulmonary embolus. 2. Right lower lobe atelectasis with thickening of the peribronchovascular interstitium and peripheral subpleural areas of nodular consolidation, likely secondary to infection/inflammation. Aspiration not excluded. 3. Aortic atherosclerosis and coronary artery calcifications. Aortic Atherosclerosis (ICD10-I70.0). Electronically Signed   By: Signa Kell M.D.   On: 06/27/2021 06:33     @   Assessment and Plan:   Chest pain  CAD s/p PCI mLAD 2019 - presents with chest pain, sounds ot be somewhat pleuritic in nature, found to have COVID infection with leukocytosis. HS trop negative x 3. CXR unremarkable. CTA chest negative for PE, showed inflammation/infection RLL. EKG without ischemic changes - Last Catheterization 07/2020 showed patent proximal LAD stent with mild ISR, 60% mid LAD stenosis, and 90% apical LAD stenosis. The apical LAD stenosis was felt to be the culprit but was not amenable o PCI given distal location and medical therapy was recommended.  - Last echo 07/2020 showed LVEF 60-65%, G2DD, mildly dilated LA, mild MR - Patient is currently chest pain free - continue Aspirin  daily, Atorvastatin, and Lopressor BID - Suspect symptoms from COVID, however given CAD history can do Myoview stress test as outpatient. Overall reassuring with negative troponin. MD to see.  COVID infection H/o COPD - IV remedesivir and dupnebs - treatment per IM  Mild AKI - Scr 1.22/BUN 25 - s/p IVF - lisinopril held - daily BMET  HTN - Lopressor  BID - lisinopril held for kidney function  HLD - continue PTA atorvastatin  daily  DM2 - SSI per IM - A1C  History of atrial tachycardia - denies palpitations - continue BB  For questions or updates, please contact [facility] Please consult [email or web address] for contact info under     Signed, Cadence David Stall, PA-C  06/27/2021 7:45 AM    I have seen and examined this patient with Cadence Furth.  Agree with above, note added to reflect my findings.  On exam, RRR, no murmurs.  Patient presented to hospital  with chest pain.  She is currently chest pain-free.  Chest pain occurred yesterday.  Patient feels that she was likely reading.  On presentation to the hospital, she was found to be COVID-positive.  Fortunately, she has had negative troponin x3.  At this point, it is unlikely that she  has coronary artery disease as a cause of her chest pain.  Would continue with current work-up and therapy for COVID-19.  Jamilex Bohnsack M. Natarsha Hurwitz MD 06/27/2021 12:33 PM

## 2021-06-28 ENCOUNTER — Other Ambulatory Visit: Payer: Self-pay

## 2021-06-28 ENCOUNTER — Inpatient Hospital Stay (HOSPITAL_COMMUNITY)
Admit: 2021-06-28 | Discharge: 2021-06-28 | Disposition: A | Discharge status: Home / Self Care | Payer: Medicare HMO | Attending: Physician Assistant | Admitting: Physician Assistant

## 2021-06-28 DIAGNOSIS — E876 Hypokalemia: Secondary | ICD-10-CM | POA: Diagnosis present

## 2021-06-28 DIAGNOSIS — Z8249 Family history of ischemic heart disease and other diseases of the circulatory system: Secondary | ICD-10-CM | POA: Diagnosis not present

## 2021-06-28 DIAGNOSIS — N179 Acute kidney failure, unspecified: Secondary | ICD-10-CM | POA: Diagnosis present

## 2021-06-28 DIAGNOSIS — A419 Sepsis, unspecified organism: Secondary | ICD-10-CM | POA: Diagnosis not present

## 2021-06-28 DIAGNOSIS — R918 Other nonspecific abnormal finding of lung field: Secondary | ICD-10-CM | POA: Diagnosis not present

## 2021-06-28 DIAGNOSIS — I110 Hypertensive heart disease with heart failure: Secondary | ICD-10-CM | POA: Diagnosis present

## 2021-06-28 DIAGNOSIS — E1165 Type 2 diabetes mellitus with hyperglycemia: Secondary | ICD-10-CM | POA: Diagnosis not present

## 2021-06-28 DIAGNOSIS — I5032 Chronic diastolic (congestive) heart failure: Secondary | ICD-10-CM

## 2021-06-28 DIAGNOSIS — I471 Supraventricular tachycardia: Secondary | ICD-10-CM | POA: Diagnosis present

## 2021-06-28 DIAGNOSIS — R059 Cough, unspecified: Secondary | ICD-10-CM | POA: Diagnosis not present

## 2021-06-28 DIAGNOSIS — G9349 Other encephalopathy: Secondary | ICD-10-CM | POA: Diagnosis not present

## 2021-06-28 DIAGNOSIS — J9601 Acute respiratory failure with hypoxia: Secondary | ICD-10-CM | POA: Diagnosis not present

## 2021-06-28 DIAGNOSIS — Z90710 Acquired absence of both cervix and uterus: Secondary | ICD-10-CM | POA: Diagnosis not present

## 2021-06-28 DIAGNOSIS — I214 Non-ST elevation (NSTEMI) myocardial infarction: Secondary | ICD-10-CM | POA: Diagnosis not present

## 2021-06-28 DIAGNOSIS — J441 Chronic obstructive pulmonary disease with (acute) exacerbation: Secondary | ICD-10-CM | POA: Diagnosis not present

## 2021-06-28 DIAGNOSIS — M353 Polymyalgia rheumatica: Secondary | ICD-10-CM | POA: Diagnosis present

## 2021-06-28 DIAGNOSIS — R079 Chest pain, unspecified: Secondary | ICD-10-CM | POA: Diagnosis not present

## 2021-06-28 DIAGNOSIS — F05 Delirium due to known physiological condition: Secondary | ICD-10-CM | POA: Diagnosis not present

## 2021-06-28 DIAGNOSIS — I25118 Atherosclerotic heart disease of native coronary artery with other forms of angina pectoris: Secondary | ICD-10-CM | POA: Diagnosis present

## 2021-06-28 DIAGNOSIS — Z9049 Acquired absence of other specified parts of digestive tract: Secondary | ICD-10-CM | POA: Diagnosis not present

## 2021-06-28 DIAGNOSIS — Z825 Family history of asthma and other chronic lower respiratory diseases: Secondary | ICD-10-CM | POA: Diagnosis not present

## 2021-06-28 DIAGNOSIS — R69 Illness, unspecified: Secondary | ICD-10-CM | POA: Diagnosis not present

## 2021-06-28 DIAGNOSIS — E1159 Type 2 diabetes mellitus with other circulatory complications: Secondary | ICD-10-CM | POA: Diagnosis not present

## 2021-06-28 DIAGNOSIS — Z66 Do not resuscitate: Secondary | ICD-10-CM | POA: Diagnosis not present

## 2021-06-28 DIAGNOSIS — D649 Anemia, unspecified: Secondary | ICD-10-CM | POA: Diagnosis not present

## 2021-06-28 DIAGNOSIS — J440 Chronic obstructive pulmonary disease with acute lower respiratory infection: Secondary | ICD-10-CM | POA: Diagnosis present

## 2021-06-28 DIAGNOSIS — I10 Essential (primary) hypertension: Secondary | ICD-10-CM | POA: Diagnosis not present

## 2021-06-28 DIAGNOSIS — R0789 Other chest pain: Secondary | ICD-10-CM | POA: Diagnosis not present

## 2021-06-28 DIAGNOSIS — J1282 Pneumonia due to coronavirus disease 2019: Secondary | ICD-10-CM | POA: Diagnosis not present

## 2021-06-28 DIAGNOSIS — M8580 Other specified disorders of bone density and structure, unspecified site: Secondary | ICD-10-CM | POA: Diagnosis present

## 2021-06-28 DIAGNOSIS — I252 Old myocardial infarction: Secondary | ICD-10-CM | POA: Diagnosis not present

## 2021-06-28 DIAGNOSIS — U071 COVID-19: Secondary | ICD-10-CM | POA: Diagnosis not present

## 2021-06-28 DIAGNOSIS — E7800 Pure hypercholesterolemia, unspecified: Secondary | ICD-10-CM | POA: Diagnosis present

## 2021-06-28 DIAGNOSIS — J449 Chronic obstructive pulmonary disease, unspecified: Secondary | ICD-10-CM | POA: Diagnosis not present

## 2021-06-28 DIAGNOSIS — I700 Atherosclerosis of aorta: Secondary | ICD-10-CM | POA: Diagnosis not present

## 2021-06-28 LAB — CBC WITH DIFFERENTIAL/PLATELET
Abs Immature Granulocytes: 0.08 10*3/uL — ABNORMAL HIGH (ref 0.00–0.07)
Basophils Absolute: 0.1 10*3/uL (ref 0.0–0.1)
Basophils Relative: 0 %
Eosinophils Absolute: 0.5 10*3/uL (ref 0.0–0.5)
Eosinophils Relative: 3 %
HCT: 37.4 % (ref 36.0–46.0)
Hemoglobin: 12.6 g/dL (ref 12.0–15.0)
Immature Granulocytes: 1 %
Lymphocytes Relative: 8 %
Lymphs Abs: 1.2 10*3/uL (ref 0.7–4.0)
MCH: 30.2 pg (ref 26.0–34.0)
MCHC: 33.7 g/dL (ref 30.0–36.0)
MCV: 89.7 fL (ref 80.0–100.0)
Monocytes Absolute: 1.1 10*3/uL — ABNORMAL HIGH (ref 0.1–1.0)
Monocytes Relative: 7 %
Neutro Abs: 12.1 10*3/uL — ABNORMAL HIGH (ref 1.7–7.7)
Neutrophils Relative %: 81 %
Platelets: 235 10*3/uL (ref 150–400)
RBC: 4.17 MIL/uL (ref 3.87–5.11)
RDW: 13.7 % (ref 11.5–15.5)
WBC: 15 10*3/uL — ABNORMAL HIGH (ref 4.0–10.5)
nRBC: 0 % (ref 0.0–0.2)

## 2021-06-28 LAB — COMPREHENSIVE METABOLIC PANEL
ALT: 10 U/L (ref 0–44)
AST: 13 U/L — ABNORMAL LOW (ref 15–41)
Albumin: 3.5 g/dL (ref 3.5–5.0)
Alkaline Phosphatase: 70 U/L (ref 38–126)
Anion gap: 11 (ref 5–15)
BUN: 20 mg/dL (ref 8–23)
CO2: 21 mmol/L — ABNORMAL LOW (ref 22–32)
Calcium: 8.7 mg/dL — ABNORMAL LOW (ref 8.9–10.3)
Chloride: 104 mmol/L (ref 98–111)
Creatinine, Ser: 0.63 mg/dL (ref 0.44–1.00)
GFR, Estimated: >60 mL/min (ref >60)
Glucose, Bld: 157 mg/dL — ABNORMAL HIGH (ref 70–99)
Potassium: 3.5 mmol/L (ref 3.5–5.1)
Sodium: 136 mmol/L (ref 135–145)
Total Bilirubin: 1.5 mg/dL — ABNORMAL HIGH (ref 0.3–1.2)
Total Protein: 6.6 g/dL (ref 6.5–8.1)

## 2021-06-28 LAB — ECHOCARDIOGRAM COMPLETE
AR max vel: 2.3 cm2
AV Area VTI: 2.58 cm2
AV Area mean vel: 2.23 cm2
AV Mean grad: 7 mmHg
AV Peak grad: 11 mmHg
Ao pk vel: 1.66 m/s
Area-P 1/2: 3.89 cm2
MV VTI: 2.53 cm2
S' Lateral: 2.4 cm

## 2021-06-28 LAB — MAGNESIUM: Magnesium: 1.7 mg/dL (ref 1.7–2.4)

## 2021-06-28 LAB — GLUCOSE, CAPILLARY: Glucose-Capillary: 177 mg/dL — ABNORMAL HIGH (ref 70–99)

## 2021-06-28 LAB — C-REACTIVE PROTEIN: CRP: 15.9 mg/dL — ABNORMAL HIGH (ref <1.0)

## 2021-06-28 LAB — D-DIMER, QUANTITATIVE: D-Dimer, Quant: 1.27 ug/mL-FEU — ABNORMAL HIGH (ref 0.00–0.50)

## 2021-06-28 LAB — FERRITIN: Ferritin: 159 ng/mL (ref 11–307)

## 2021-06-28 MED ORDER — HALOPERIDOL LACTATE 5 MG/ML IJ SOLN
1.0000 mg | Freq: Four times a day (QID) | INTRAMUSCULAR | Status: DC | PRN
  Administered 2021-06-28: 2 mg via INTRAMUSCULAR
  Filled 2021-06-28: qty 1

## 2021-06-28 MED ORDER — ENOXAPARIN SODIUM 40 MG/0.4ML IJ SOSY
40.0000 mg | PREFILLED_SYRINGE | INTRAMUSCULAR | Status: DC
  Administered 2021-06-29 – 2021-07-02 (×4): 40 mg via SUBCUTANEOUS
  Filled 2021-06-28 (×4): qty 0.4

## 2021-06-28 NOTE — Evaluation (Signed)
Occupational Therapy Evaluation Patient Details Name: Dlyla Meijer MRN: [id number] DOB: 1933-07-30 Today's Date: 06/28/2021    History of Present Illness Laylonie Engblom is an 88yoF who comes to [facility] on 8/28 c CP. CAD s/p PCI to the LAD in 09/2018 in the setting of a NSTEMI with recurrent NSTEMI in 07/2020 medically managed given distal LAD involvement not amenable to PCI, atrial tachycardia, Covid in 12/2020, COPD in the setting of extensive second-hand smoke exposure and worked in a [employer] for 29 years with recurrent admissions for exacerbations, DM, acute blood loss anemia, HTN, HLD, and osteoporosis who was admitted on 8/28 with recurrent Covid. Cardiology consulted 06/27/2021 for chest pain. HS-Tn negative x 4. D-dimer elevated with CTA chest negative for PE. Patient Covid positive. ACS felt to be unlikely at time of consult. Obtain echo to evaluate for significant changes when compared to prior study in 07/2020 and pericardial effusion.   Clinical Impression   Patient presenting with decreased Ind in self care, balance, functional mobility/transfers, endurance, and safety awareness. Patient has been confused but son enters into room during evaluation to confirm baseline. Pt lives at home alone and independently without use of AD at baseline. She does not drive and family assists her with appointments and groceries. Pt performs bed mobility with supervision and is very tearful and reports, " I don't know " when asking her what is wrong. Pt washes face with set up A on EOB and then ambulates with min HHA to bathroom for toileting. Pt performs hygiene and clothing management with min A for balance. Hand hygiene while standing at sink with min guard and then pt returns to bed and is much calmer with son in room.Patient will benefit from acute OT to increase overall independence in the areas of ADLs, functional mobility, and safety awareness in order to safely discharge home.    Follow Up  Recommendations  Home health OT;Supervision/Assistance - 24 hour    Equipment Recommendations  None recommended by OT       Precautions / Restrictions Precautions Precautions: Fall Restrictions Weight Bearing Restrictions: No      Mobility Bed Mobility Overal bed mobility: Needs Assistance Bed Mobility: Supine to Sit;Sit to Supine     Supine to sit: Supervision Sit to supine: Supervision   General bed mobility comments: min cuing for safety    Transfers Overall transfer level: Needs assistance Equipment used: None Transfers: Sit to/from Stand;Stand Pivot Transfers Sit to Stand: Min guard Stand pivot transfers: Min guard;Min assist       General transfer comment: labored, unsteady    Balance Overall balance assessment: Needs assistance Sitting-balance support: Feet supported;Bilateral upper extremity supported Sitting balance-Leahy Scale: Good     Standing balance support: During functional activity;Bilateral upper extremity supported Standing balance-Leahy Scale: Fair Standing balance comment: UE support for balance                           ADL either performed or assessed with clinical judgement   ADL Overall ADL's : Needs assistance/impaired     Grooming: Wash/dry hands;Wash/dry face;Sitting;Set up;Supervision/safety                   Toilet Transfer: Min guard;Minimal assistance;Ambulation Toilet Transfer Details (indicate cue type and reason): hand held Toileting- Clothing Manipulation and Hygiene: Minimal assistance Toileting - Clothing Manipulation Details (indicate cue type and reason): Pt able to perform clothing management and hygiene with assist for balance  Vision Patient Visual Report: No change from baseline              Pertinent Vitals/Pain Pain Assessment: No/denies pain     Hand Dominance Right   Extremity/Trunk Assessment Upper Extremity Assessment Upper Extremity Assessment: Overall WFL for  tasks assessed;Generalized weakness   Lower Extremity Assessment Lower Extremity Assessment: Overall WFL for tasks assessed;Generalized weakness       Communication Communication Communication: No difficulties   Cognition Arousal/Alertness: Awake/alert Behavior During Therapy: Restless;Impulsive Overall Cognitive Status: Impaired/Different from baseline                                 General Comments: Pt is impulsive and trying to get out of bed. Very emotional and needing therapeutic use of self to calm down for session.              Home Living Family/patient expects to be discharged to:: Private residence Living Arrangements: Alone Available Help at Discharge: Family;Available PRN/intermittently Type of Home: House Home Access: Stairs to enter [employer] of Steps: 2 Entrance Stairs-Rails: None Home Layout: One level     Bathroom Shower/Tub: Chief Strategy Officer: Standard     Home Equipment: Environmental consultant - 2 wheels;Cane - single point;Shower seat          Prior Functioning/Environment Level of Independence: Independent with assistive device(s)        Comments: Son present to confirm baseline. Pt lives alone and is Ind in mobility and self care. She does not drive and family takes her where she needs to go. She has AD but does not use. She performs sink baths for safety.        OT Problem List: Decreased strength;Decreased cognition;Decreased activity tolerance;Decreased safety awareness;Impaired balance (sitting and/or standing);Decreased knowledge of use of DME or AE      OT Treatment/Interventions: Self-care/ADL training;Therapeutic exercise;Therapeutic activities;Cognitive remediation/compensation;Energy conservation;DME and/or AE instruction;Balance training;Patient/family education;Manual therapy    OT Goals(Current goals can be found in the care plan section) Acute Rehab OT Goals Patient Stated Goal: to go home OT  Goal Formulation: With patient/family Time For Goal Achievement: 07/12/21 Potential to Achieve Goals: Good ADL Goals Pt Will Perform Grooming: with modified independence;standing Pt Will Perform Lower Body Dressing: with supervision;sit to/from stand Pt Will Transfer to Toilet: with supervision;ambulating Pt Will Perform Toileting - Clothing Manipulation and hygiene: with supervision;sit to/from stand  OT Frequency: Min 2X/week   Barriers to D/C:    none known at this time          AM-PAC OT "6 Clicks" Daily Activity     Outcome Measure Help from another person eating meals?: None Help from another person taking care of personal grooming?: None Help from another person toileting, which includes using toliet, bedpan, or urinal?: A Little Help from another person bathing (including washing, rinsing, drying)?: A Little Help from another person to put on and taking off regular upper body clothing?: None Help from another person to put on and taking off regular lower body clothing?: A Little 6 Click Score: 21   End of Session Nurse Communication: Mobility status  Activity Tolerance: Patient tolerated treatment well Patient left: in bed;with call bell/phone within reach;with bed alarm set;with nursing/sitter in room;with family/visitor present  OT Visit Diagnosis: Unsteadiness on feet (R26.81);Muscle weakness (generalized) (M62.81)                Time: 2774-1287 OT Time  Calculation (min): 29 min Charges:  OT General Charges $OT Visit: 1 Visit OT Evaluation $OT Eval Moderate Complexity: 1 Mod OT Treatments $Self Care/Home Management : 8-22 mins Jackquline Denmark, MS, OTR/L , CBIS ascom [phone]  06/28/21, 3:58 PM

## 2021-06-28 NOTE — Telephone Encounter (Signed)
Patient is currently admitted to hospital.

## 2021-06-28 NOTE — Progress Notes (Signed)
Pt trying to get out of bed all morning. Tele sitter in the room. Pt adamant about going home and refusing telemetry, lovenox, pulled out both IV's and refusing to have another one put in. Pt did take oral medications. Dr Denton Lank made aware.

## 2021-06-28 NOTE — Progress Notes (Signed)
PROGRESS NOTE    [place]   ZOX:[id number]  DOB: [birth year]  PCP: Dale Versailles, MD    DOA: 06/27/2021 LOS: 0    Brief Narrative / Hospital Course to Date:   87 y.o. female with PMHx of CAD s/p PCI and stent, HFpEF, asthma, type 2 diabetes mellitus, HTN, HLD, polymyalgia rheumatica, and reactive airway disease, who presented to the ER with midsternal chest pain, mild dry cough.    Troponin trended and negative x 3.   EKG without acute ischemic changes.  ACS ruled out.  Patient tested positive for Covid-19.  Chest xray negative for pneumonia.  She did require 3 L/min supplemental oxygen on day 1 of hospitalization.  Has been weaned to room air.  Patient having some hospital delirium and requiring sitter for safety when family not able to be present.  Assessment & Plan   Active Problems:   Hypertension   Hypercholesterolemia   Reactive airway disease   Chest pain   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)   Cough   Anemia   Type 2 diabetes mellitus with cardiac complication (HCC)   Chronic diastolic CHF (congestive heart failure) (HCC)   Acute respiratory failure with hypoxia (HCC)   COVID-19 virus infection   Covid-19 infection with acute respiratory failure with hypoxia / Cough Continue remdesivir Supplement O2 to keep sats 90%+ Continue ASA, vitamins Supportive care: bronchodilators, mucoltyics  Chest pain - ACS ruled out.  Likely due to cough with Covid infection.  Cardiology was consulted on admission and advised Echo to compare to prior and evaluate for pericardial effusion. Continue ASA., sublingual nitro PRN.  Chronic diastolic CHF - prior Echo in Oct 2021 with EF 60-65%, grade II diastolic dysfunction.   Pt appears compensated, euvolemic.   Monitor volume status.  Echo 06/28/21 - EF 55-60%, indeterminate diastolic parameters, mild-mod MR, mildly elevated PASP. No pericardial effusion.  History of asthma  /reactive airway disease -not acutely exacerbated; patient is without wheezing on exam.  Home Advair is on hold.  On scheduled Combivent and as needed albuterol.  Monitor closely.  AKI - mild, POA with creatinine 1.2, baseline 0.6-0.8. Resolved with IV hydration. Monitor BMP.  Essential hypertension -continue Lopressor.  Zestril held on admission due to AKI.  Continue home Zestril as patient normotensive.  Type 2 diabetes -sliding scale NovoLog.  Hold metformin  Hypokalemia -on statin    Patient BMI: Body mass index is 22.3 kg/m.   DVT prophylaxis: enoxaparin (LOVENOX) injection 40 mg Start: 06/29/21 0800   Diet:  Diet Orders (From admission, onward)     Start     Ordered   06/27/21 1348  Diet Heart Room service appropriate? Yes; Fluid consistency: Thin  Diet effective now       Question Answer Comment  Room service appropriate? Yes   Fluid consistency: Thin      06/27/21 1347              Code Status: DNR   Subjective 06/28/21    Patient seen at bedside this morning.  She appeared sleeping but woke easily to voice.  She then started to cry and states she wants to go home.  She turned her head away from me and would not answer any questions.  Bedside RN this morning reported patient had pulled her IV, frequently attempting to get out of bed to go home, refusing any meds or care.  Requiring sitter for safety.  Reportedly better this afternoon  after son visited and was able to calm her down IV was able to be replaced and remdesivir given.  Disposition Plan & Communication   Status is: Inpatient  Remains inpatient appropriate because:IV treatments appropriate due to intensity of illness or inability to take PO  Dispo: The patient is from: Home              Anticipated d/c is to: Home              Patient currently is not medically stable to d/c.   Difficult to place patient No   Family Communication: Spoke with patient's daughter Rinaldo Cloud by phone today  8/29   Consults, Procedures, Significant Events   Consultants:  Cardiology  Procedures:  Echo 8/29, see findings above  Antimicrobials:  Anti-infectives (From admission, onward)    Start     Dose/Rate Route Frequency Ordered Stop   06/28/21 1000  remdesivir 100 mg in sodium chloride 0.9 % 100 mL IVPB       See Hyperspace for full Linked Orders Report.   100 mg 200 mL/hr over 30 Minutes Intravenous Daily 06/27/21 0447 07/02/21 0959   06/27/21 0600  remdesivir 200 mg in sodium chloride 0.9% 250 mL IVPB       See Hyperspace for full Linked Orders Report.   200 mg 580 mL/hr over 30 Minutes Intravenous Once 06/27/21 0447 06/27/21 0729         Micro    Objective   Vitals:   06/28/21 0824 06/28/21 1159 06/28/21 1334 06/28/21 1502  BP: (!) 130/57 131/68  128/74  Pulse: (!) 110 79    Resp: 19 19  18   Temp: 99 F (37.2 C) 99.5 F (37.5 C) (!) 101 F (38.3 C) 100.2 F (37.9 C)  TempSrc: Oral  Oral Oral  SpO2: 98% 98%  98%  Weight:      Height:        Intake/Output Summary (Last 24 hours) at 06/28/2021 1819 Last data filed at 06/28/2021 1811 Gross per 24 hour  Intake 580 ml  Output 0 ml  Net 580 ml   Filed Weights   06/26/21 1946  Weight: 53.5 kg    Physical Exam:  General exam: awake, alert, no acute distress, tearful and withdrawn Respiratory system: CTAB, no wheezes, rales or rhonchi, normal respiratory effort, on room air. Cardiovascular system: normal S1/S2, RRR, no pedal edema.   Gastrointestinal system: soft, nontender abdomen  Central nervous system: Limited exam, seems oriented to self but does not answer other orientation questions no gross focal neurologic deficits, normal speech Extremities: moves all, no edema, normal tone Skin: dry, intact, normal temperature Psychiatry: normal mood, congruent affect, judgement and insight appear normal  Labs   Data Reviewed: I have personally reviewed following labs and imaging studies  CBC: Recent Labs   Lab 06/22/21 1448 06/26/21 1951 06/28/21 0456  WBC 10.4 18.0* 15.0*  NEUTROABS 6.2  --  12.1*  HGB 13.0 12.9 12.6  HCT 39.3 38.5 37.4  MCV 87.2 89.7 89.7  PLT 287.0 258 235   Basic Metabolic Panel: Recent Labs  Lab 06/26/21 1951 06/28/21 0456  NA 136 136  K 3.7 3.5  CL 102 104  CO2 23 21*  GLUCOSE 153* 157*  BUN 25* 20  CREATININE 1.22* 0.63  CALCIUM 8.8* 8.7*  MG  --  1.7   GFR: Estimated Creatinine Clearance: 38.1 mL/min (by C-G formula based on SCr of 0.63 mg/dL). Liver Function Tests: Recent Labs  Lab 06/28/21  0456  AST 13*  ALT 10  ALKPHOS 70  BILITOT 1.5*  PROT 6.6  ALBUMIN 3.5   No results for input(s): LIPASE, AMYLASE in the last 168 hours. No results for input(s): AMMONIA in the last 168 hours. Coagulation Profile: No results for input(s): INR, PROTIME in the last 168 hours. Cardiac Enzymes: No results for input(s): CKTOTAL, CKMB, CKMBINDEX, TROPONINI in the last 168 hours. BNP (last 3 results) No results for input(s): PROBNP in the last 8760 hours. HbA1C: No results for input(s): HGBA1C in the last 72 hours. CBG: Recent Labs  Lab 06/27/21 2158  GLUCAP 125*   Lipid Profile: Recent Labs    06/27/21 0411  CHOL 175  HDL 44  LDLCALC 108*  TRIG 113  CHOLHDL 4.0   Thyroid Function Tests: No results for input(s): TSH, T4TOTAL, FREET4, T3FREE, THYROIDAB in the last 72 hours. Anemia Panel: Recent Labs    06/27/21 0531 06/28/21 0456  FERRITIN 106 159   Sepsis Labs: Recent Labs  Lab 06/27/21 0531  PROCALCITON <0.10    Recent Results (from the past 240 hour(s))  Resp Panel by RT-PCR (Flu A&B, Covid) Nasopharyngeal Swab     Status: Abnormal   Collection Time: 06/27/21  1:56 AM   Specimen: Nasopharyngeal Swab; Nasopharyngeal(NP) swabs in vial transport medium  Result Value Ref Range Status   SARS Coronavirus 2 by RT PCR POSITIVE (A) NEGATIVE Final    Comment: RESULT CALLED TO, READ BACK BY AND VERIFIED WITH: 0319 06/27/21 APRIL  BRUMGARD LFD (NOTE) SARS-CoV-2 target nucleic acids are DETECTED.  The SARS-CoV-2 RNA is generally detectable in upper respiratory specimens during the acute phase of infection. Positive results are indicative of the presence of the identified virus, but do not rule out bacterial infection or co-infection with other pathogens not detected by the test. Clinical correlation with patient history and other diagnostic information is necessary to determine patient infection status. The expected result is Negative.  Fact Sheet for Patients: BloggerCourse.com  Fact Sheet for Healthcare Providers: SeriousBroker.it  This test is not yet approved or cleared by the [place] FDA and  has been authorized for detection and/or diagnosis of SARS-CoV-2 by FDA under an Emergency Use Authorization (EUA).  This EUA will remain in effect (meaning this test can be  used) for the duration of  the COVID-19 declaration under Section 564(b)(1) of the Act, 21 U.S.C. section 360bbb-3(b)(1), unless the authorization is terminated or revoked sooner.     Influenza A by PCR NEGATIVE NEGATIVE Final   Influenza B by PCR NEGATIVE NEGATIVE Final    Comment: (NOTE) The Xpert Xpress SARS-CoV-2/FLU/RSV plus assay is intended as an aid in the diagnosis of influenza from Nasopharyngeal swab specimens and should not be used as a sole basis for treatment. Nasal washings and aspirates are unacceptable for Xpert Xpress SARS-CoV-2/FLU/RSV testing.  Fact Sheet for Patients: BloggerCourse.com  Fact Sheet for Healthcare Providers: SeriousBroker.it  This test is not yet approved or cleared by the [place] FDA and has been authorized for detection and/or diagnosis of SARS-CoV-2 by FDA under an Emergency Use Authorization (EUA). This EUA will remain in effect (meaning this test can be used) for the duration of  the COVID-19 declaration under Section 564(b)(1) of the Act, 21 U.S.C. section 360bbb-3(b)(1), unless the authorization is terminated or revoked.  Performed at [facility], [address] [place], [place] [address]       Imaging Studies   DG Chest 2 View  Result Date: 06/26/2021 CLINICAL  DATA:  Chest pain. EXAM: CHEST - 2 VIEW COMPARISON:  Chest radiograph dated 05/24/2021 FINDINGS: Bibasilar interstitial coarsening and atelectasis/scarring. No focal consolidation, pleural effusion, pneumothorax the cardiac silhouette is within limits. Atherosclerotic calcification of the aorta. Osteopenia with degenerative changes of the spine. No acute osseous pathology. IMPRESSION: No active cardiopulmonary disease. Electronically Signed   By: Elgie Collard M.D.   On: 06/26/2021 20:14   CT Angio Chest Pulmonary Embolism (PE) W or WO Contrast  Result Date: 06/27/2021 CLINICAL DATA:  Shortness of breath. EXAM: CT ANGIOGRAPHY CHEST WITH CONTRAST TECHNIQUE: Multidetector CT imaging of the chest was performed using the standard protocol during bolus administration of intravenous contrast. Multiplanar CT image reconstructions and MIPs were obtained to evaluate the vascular anatomy. CONTRAST:  75mL OMNIPAQUE IOHEXOL 350 MG/ML SOLN COMPARISON:  08/19/2020 FINDINGS: Cardiovascular: Satisfactory opacification of the pulmonary arteries to the segmental level. No evidence of pulmonary embolism. Normal heart size. No pericardial effusion. Aortic atherosclerosis and coronary artery calcifications. Mediastinum/Nodes: Normal appearance of the thyroid gland. The trachea appears patent and is midline. Small hiatal hernia. No mediastinal, supraclavicular, or axillary adenopathy. Calcified right hilar lymph nodes identified. No pleural effusion identified. Lungs/Pleura: There is new right lower lobe atelectasis with thickening of the peribronchovascular interstitium and peripheral subpleural areas of nodular  consolidation, likely secondary to infection/inflammation. Areas of subsegmental atelectasis noted within the lingula and posterior left base. Calcified granuloma identified within the left lower lobe. Upper Abdomen: Status post cholecystectomy. Tiny area of pneumobilia noted within the left hepatic lobe. Aortic atherosclerosis. Calcified granuloma in the spleen. Musculoskeletal: Spondylosis identified within the thoracic spine. No acute findings. Review of the MIP images confirms the above findings. IMPRESSION: 1. No evidence for acute pulmonary embolus. 2. Right lower lobe atelectasis with thickening of the peribronchovascular interstitium and peripheral subpleural areas of nodular consolidation, likely secondary to infection/inflammation. Aspiration not excluded. 3. Aortic atherosclerosis and coronary artery calcifications. Aortic Atherosclerosis (ICD10-I70.0). Electronically Signed   By: Signa Kell M.D.   On: 06/27/2021 06:33   ECHOCARDIOGRAM COMPLETE  Result Date: 06/28/2021    ECHOCARDIOGRAM REPORT   Patient Name:   Tanya Cole [facility] Date of Exam: 06/28/2021 Medical Rec #:  [id number]          Height:       61.0 in Accession #:    [id number]         Weight:       118.0 lb Date of Birth:  [birth year]          BSA:          1.509 m Patient Age:    88 years           BP:           130/57 mmHg Patient Gender: F                  HR:           92 bpm. Exam Location:  [facility] Procedure: 2D Echo, Color Doppler and Cardiac Doppler Indications:     R07.9 Chest Pain  History:         Patient has prior history of Echocardiogram examinations, most                  recent 08/08/2020. HFpEF and CHF, CAD; Risk Factors:Diabetes,                  Hypertension and HCL. Pt tested positive for COVID-19 on  06/27/21.  Sonographer:     Humphrey Rolls Referring Phys:  [id number] Raymon Mutton DUNN Diagnosing Phys: Lorine Bears MD IMPRESSIONS  1. Left ventricular ejection fraction, by estimation, is 55 to 60%. The left ventricle  has normal function. The left ventricle has no regional wall motion abnormalities. Left ventricular diastolic parameters are indeterminate.  2. Right ventricular systolic function is normal. The right ventricular size is normal. There is mildly elevated pulmonary artery systolic pressure.  3. The mitral valve is normal in structure. Mild to moderate mitral valve regurgitation. No evidence of mitral stenosis.  4. The aortic valve is normal in structure. Aortic valve regurgitation is not visualized. Mild aortic valve sclerosis is present, with no evidence of aortic valve stenosis.  5. The inferior vena cava is normal in size with greater than 50% respiratory variability, suggesting right atrial pressure of 3 mmHg. FINDINGS  Left Ventricle: Left ventricular ejection fraction, by estimation, is 55 to 60%. The left ventricle has normal function. The left ventricle has no regional wall motion abnormalities. The left ventricular internal cavity size was normal in size. There is  no left ventricular hypertrophy. Left ventricular diastolic parameters are indeterminate. Right Ventricle: The right ventricular size is normal. No increase in right ventricular wall thickness. Right ventricular systolic function is normal. There is mildly elevated pulmonary artery systolic pressure. The tricuspid regurgitant velocity is 2.87  m/s, and with an assumed right atrial pressure of 3 mmHg, the estimated right ventricular systolic pressure is 35.9 mmHg. Left Atrium: Left atrial size was normal in size. Right Atrium: Right atrial size was normal in size. Pericardium: There is no evidence of pericardial effusion. Mitral Valve: The mitral valve is normal in structure. Mild mitral annular calcification. Mild to moderate mitral valve regurgitation, with posteriorly-directed jet. No evidence of mitral valve stenosis. MV peak gradient, 4.8 mmHg. The mean mitral valve gradient is 2.0 mmHg. Tricuspid Valve: The tricuspid valve is normal in  structure. Tricuspid valve regurgitation is not demonstrated. No evidence of tricuspid stenosis. Aortic Valve: The aortic valve is normal in structure. Aortic valve regurgitation is not visualized. Mild aortic valve sclerosis is present, with no evidence of aortic valve stenosis. Aortic valve mean gradient measures 7.0 mmHg. Aortic valve peak gradient measures 11.0 mmHg. Aortic valve area, by VTI measures 2.58 cm. Pulmonic Valve: The pulmonic valve was normal in structure. Pulmonic valve regurgitation is mild. No evidence of pulmonic stenosis. Aorta: The aortic root is normal in size and structure. Venous: The inferior vena cava was not well visualized. The inferior vena cava is normal in size with greater than 50% respiratory variability, suggesting right atrial pressure of 3 mmHg. IAS/Shunts: No atrial level shunt detected by color flow Doppler.  LEFT VENTRICLE PLAX 2D LVIDd:         3.90 cm  Diastology LVIDs:         2.40 cm  LV e' medial:    5.44 cm/s LV PW:         0.90 cm  LV E/e' medial:  19.5 LV IVS:        1.00 cm  LV e' lateral:   9.46 cm/s LVOT diam:     2.30 cm  LV E/e' lateral: 11.2 LV SV:         70 LV SV Index:   47 LVOT Area:     4.15 cm  RIGHT VENTRICLE RV Basal diam:  2.90 cm LEFT ATRIUM  Index       RIGHT ATRIUM           Index LA diam:        3.80 cm 2.52 cm/m  RA Area:     11.40 cm LA Vol (A2C):   36.4 ml 24.12 ml/m RA Volume:   22.70 ml  15.04 ml/m LA Vol (A4C):   50.0 ml 33.13 ml/m LA Biplane Vol: 44.4 ml 29.42 ml/m  AORTIC VALVE                    PULMONIC VALVE AV Area (Vmax):    2.30 cm     PV Vmax:       0.89 m/s AV Area (Vmean):   2.23 cm     PV Vmean:      60.700 cm/s AV Area (VTI):     2.58 cm     PV VTI:        0.164 m AV Vmax:           166.00 cm/s  PV Peak grad:  3.2 mmHg AV Vmean:          122.000 cm/s PV Mean grad:  2.0 mmHg AV VTI:            0.272 m AV Peak Grad:      11.0 mmHg AV Mean Grad:      7.0 mmHg LVOT Vmax:         92.00 cm/s LVOT Vmean:         65.600 cm/s LVOT VTI:          0.169 m LVOT/AV VTI ratio: 0.62  AORTA Ao Root diam: 2.70 cm MITRAL VALVE                TRICUSPID VALVE MV Area (PHT): 3.89 cm     TR Peak grad:   32.9 mmHg MV Area VTI:   2.53 cm     TR Vmax:        287.00 cm/s MV Peak grad:  4.8 mmHg MV Mean grad:  2.0 mmHg     SHUNTS MV Vmax:       1.10 m/s     Systemic VTI:  0.17 m MV Vmean:      67.2 cm/s    Systemic Diam: 2.30 cm MV Decel Time: 195 msec MV E velocity: 106.00 cm/s MV A velocity: 102.00 cm/s MV E/A ratio:  1.04 Lorine Bears MD Electronically signed by Lorine Bears MD Signature Date/Time: 06/28/2021/1:14:56 PM    Final      Medications   Scheduled Meds:  alum & mag hydroxide-simeth  30 mL Oral Once   And   lidocaine  15 mL Oral Once   vitamin C  500 mg Oral Daily   aspirin EC  81 mg Oral Daily   atorvastatin  80 mg Oral QPM   cholecalciferol  1,000 Units Oral Daily   [START ON 06/29/2021] enoxaparin (LOVENOX) injection  40 mg Subcutaneous Q24H   fluticasone  2 spray Each Nare Daily   guaiFENesin  600 mg Oral BID   Ipratropium-Albuterol  1 puff Inhalation QID   lisinopril  20 mg Oral Daily   metoprolol tartrate  25 mg Oral BID   Vitamin D (Ergocalciferol)  50,000 Units Oral Weekly   zinc sulfate  220 mg Oral Daily   Continuous Infusions:  remdesivir 100 mg in NS 100 mL         LOS: 0 days  Time spent: 30 minutes    Pennie Banter, DO [facility]  06/28/2021, 6:19 PM      If 7PM-7AM, please contact night-coverage. How to contact the [facility] Attending or Consulting provider 7A - 7P or covering provider during after hours 7P -7A, for this patient?    Check the care team in [facility] and look for a) attending/consulting [facility] provider listed and b) the [facility] team listed Log into [email or web address] and use [place]'s universal password to access. If you do not have the password, please contact the hospital operator. Locate the [facility] provider you are looking for under [facility] and page  to a number that you can be directly reached. If you still have difficulty reaching the provider, please page the [facility] (Director on Call) for the Hospitalists listed on amion for assistance.

## 2021-06-28 NOTE — Progress Notes (Signed)
Pt uncooperative this morning, pulled out IV, refused to have another put in. Refused lovenox. Pt was crying off and on and "wants to go home". Telesitter in the room until this afternoon. 1 to 1 sitter ordered for pt safety. Pt impulsive and would not follow direction to stay in bed or chair. Pt son came by this afternoon and convinced pt to allow IV to be reinserted. New IV was placed and remdesevir was administered. Pt much more calm and agreeable this afternoon. Report given to [employer]

## 2021-06-28 NOTE — Progress Notes (Signed)
   87 year old female with history of CAD s/p PCI to the LAD in 09/2018 in the setting of a NSTEMI with recurrent NSTEMI in 07/2020 medically managed given distal LAD involvement not amenable to PCI, atrial tachycardia, Covid in 12/2020, COPD in the setting of extensive second-hand smoke exposure and worked in a [employer] for 29 years with recurrent admissions for exacerbations, DM, acute blood loss anemia, HTN, HLD, and osteoporosis who was admitted on 8/28 with recurrent Covid   Consulted 06/27/2021 for chest pain. HS-Tn negative x 4. D-dimer elevated with CTA chest negative for PE. Patient Covid positive. ACS felt to be unlikely at time of consult. Obtain echo to evaluate for significant changes when compared to prior study in 07/2020 and pericardial effusion.

## 2021-06-28 NOTE — Progress Notes (Signed)
*  PRELIMINARY RESULTS* Echocardiogram 2D Echocardiogram has been performed.  Tanya Cole 06/28/2021, 11:21 AM

## 2021-06-28 NOTE — Progress Notes (Signed)
Anticoagulation monitoring(Lovenox):  88yo  F ordered Lovenox 30 mg Q24h    Filed Weights   06/26/21 1946  Weight: 53.5 kg (118 lb)   BMI 22   Lab Results  Component Value Date   CREATININE 0.63 06/28/2021   CREATININE 1.22 (H) 06/26/2021   CREATININE 0.70 05/24/2021   Estimated Creatinine Clearance: 38.1 mL/min (by C-G formula based on SCr of 0.63 mg/dL). Hemoglobin & Hematocrit     Component Value Date/Time   HGB 12.6 06/28/2021 0456   HGB 11.8 (L) 02/18/2013 2004   HCT 37.4 06/28/2021 0456   HCT 35.7 02/18/2013 2004     Per Protocol for Patient with estCrcl now >30 ml/min and BMI < 40, will transition to Lovenox 40 mg Q24h      Bari Mantis PharmD Clinical Pharmacist 06/28/2021

## 2021-06-28 NOTE — Evaluation (Signed)
Physical Therapy Evaluation Patient Details Name: Tanya Cole MRN: [id number] DOB: 09-25-1933 Today's Date: 06/28/2021   History of Present Illness  Tanya Cole is an 88yoF who comes to [facility] on 8/28 c CP. CAD s/p PCI to the LAD in 09/2018 in the setting of a NSTEMI with recurrent NSTEMI in 07/2020 medically managed given distal LAD involvement not amenable to PCI, atrial tachycardia, Covid in 12/2020, COPD in the setting of extensive second-hand smoke exposure and worked in a [employer] for 29 years with recurrent admissions for exacerbations, DM, acute blood loss anemia, HTN, HLD, and osteoporosis who was admitted on 8/28 with recurrent Covid. Cardiology consulted 06/27/2021 for chest pain. HS-Tn negative x 4. D-dimer elevated with CTA chest negative for PE. Patient Covid positive. ACS felt to be unlikely at time of consult. Obtain echo to evaluate for significant changes when compared to prior study in 07/2020 and pericardial effusion.  Clinical Impression  Pt admitted with above diagnosis. Pt currently with functional limitations due to the deficits listed below (see "PT Problem List"). Patient agreeable to PT evaluation. Pt acutely altered, poor safety awareness, mild to moderate impulsivity, difficult to redirect, not following simple commands for safety. Pt repeatedly believes to be in her home at time of eval despite attempts to reorient. Pt able to perform bed mobility, transfers and AMB without assistive device, no frank LOB, but at one point essentially walking into a door (fortunately her Rt hand is the first and only point of contact with the door.) Mobility ended after concerns that patient could not be made safer with cues. Patient's assessment this date reveals the patient requires an additional person present for safety and/or physical assistance to complete their typical ADL. At baseline, the patient is able to perform ADL with modified independence. Patient will benefit from  skilled PT intervention to maximize independence and safety in mobility required for basic ADL performance at discharge.       Follow Up Recommendations Home health PT;Supervision for mobility/OOB;Supervision/Assistance - 24 hour    Equipment Recommendations  None recommended by PT    Recommendations for Other Services       Precautions / Restrictions Precautions Precautions: Fall Restrictions Weight Bearing Restrictions: No      Mobility  Bed Mobility Overal bed mobility: Needs Assistance Bed Mobility: Supine to Sit;Sit to Supine     Supine to sit: Supervision Sit to supine: Supervision   General bed mobility comments: trying to climb around bed rail, unable to redirect    Transfers Overall transfer level: Needs assistance Equipment used: None Transfers: Sit to/from Stand Sit to Stand: Min guard         General transfer comment: labored, unsteady  Ambulation/Gait   Gait Distance (Feet): 80 Feet Assistive device: None       General Gait Details: limited to AMS and safety cocnerns, pt not paying attention to environment, no following safety cues.  Stairs            Wheelchair Mobility    Modified Rankin (Stroke Patients Only)       Balance                                             Pertinent Vitals/Pain Pain Assessment: No/denies pain    Home Living Family/patient expects to be discharged to:: Private residence (taken from July admission) Living Arrangements: Alone Available  Help at Discharge: Family;Available PRN/intermittently Type of Home: House Home Access: Stairs to enter Entrance Stairs-Rails: None Entrance Stairs-Number of Steps: 2 Home Layout: One level Home Equipment: Walker - 2 wheels;Cane - single point;Shower seat      Prior Function Level of Independence: Independent with assistive device(s)         Comments: Indep with ADLs, household and limited community mobilization without assist device;  denies fall history; no home O2.     Hand Dominance   Dominant Hand: Right    Extremity/Trunk Assessment                Communication   Communication: No difficulties  Cognition   Behavior During Therapy: Restless;Impulsive Overall Cognitive Status: Impaired/Different from baseline                                 General Comments: impulsive, difficult to redirect, unclear confusion, almost walked into door for unknown reasons (suspect pt was AMB with eyes closed)      General Comments      Exercises     Assessment/Plan    PT Assessment Patient needs continued PT services  PT Problem List Decreased strength;Decreased cognition;Decreased safety awareness;Decreased knowledge of precautions;Decreased balance;Decreased mobility       PT Treatment Interventions DME instruction;Balance training;Gait training;Stair training;Neuromuscular re-education;Cognitive remediation;Functional mobility training;Patient/family education;Therapeutic activities;Wheelchair mobility training;Therapeutic exercise    PT Goals (Current goals can be found in the Care Plan section)  Acute Rehab PT Goals PT Goal Formulation: Patient unable to participate in goal setting Time For Goal Achievement: 07/12/21    Frequency Min 2X/week   Barriers to discharge        Co-evaluation               AM-PAC PT "6 Clicks" Mobility  Outcome Measure Help needed turning from your back to your side while in a flat bed without using bedrails?: A Little Help needed moving from lying on your back to sitting on the side of a flat bed without using bedrails?: A Little Help needed moving to and from a bed to a chair (including a wheelchair)?: A Little Help needed standing up from a chair using your arms (e.g., wheelchair or bedside chair)?: A Little Help needed to walk in hospital room?: A Little Help needed climbing 3-5 steps with a railing? : A Little 6 Click Score: 18    End of  Session   Activity Tolerance: Patient tolerated treatment well;Treatment limited secondary to medical complications (Comment);No increased pain (poor safety awareness, difficult to redirect) Patient left: in bed;with call bell/phone within reach;with bed alarm set   PT Visit Diagnosis: Difficulty in walking, not elsewhere classified (R26.2);Other abnormalities of gait and mobility (R26.89)    Time: 6063-0160 PT Time Calculation (min) (ACUTE ONLY): 16 min   Charges:   PT Evaluation $PT Eval Low Complexity: 1 Low         2:17 PM, 06/28/21 Rosamaria Lints, PT, DPT Physical Therapist - [facility]  [phone] (ASCOM)    Raylan Hanton C 06/28/2021, 2:13 PM

## 2021-06-28 NOTE — Progress Notes (Signed)
Pt temp now 101 F. Administered 650mg  acetaminophen. Will continue to monitor.

## 2021-06-29 ENCOUNTER — Inpatient Hospital Stay: Payer: Medicare HMO

## 2021-06-29 DIAGNOSIS — R059 Cough, unspecified: Secondary | ICD-10-CM

## 2021-06-29 LAB — D-DIMER, QUANTITATIVE: D-Dimer, Quant: 1.23 ug/mL-FEU — ABNORMAL HIGH (ref 0.00–0.50)

## 2021-06-29 LAB — COMPREHENSIVE METABOLIC PANEL
ALT: 10 U/L (ref 0–44)
AST: 13 U/L — ABNORMAL LOW (ref 15–41)
Albumin: 3.6 g/dL (ref 3.5–5.0)
Alkaline Phosphatase: 82 U/L (ref 38–126)
Anion gap: 9 (ref 5–15)
BUN: 19 mg/dL (ref 8–23)
CO2: 22 mmol/L (ref 22–32)
Calcium: 8.7 mg/dL — ABNORMAL LOW (ref 8.9–10.3)
Chloride: 106 mmol/L (ref 98–111)
Creatinine, Ser: 0.8 mg/dL (ref 0.44–1.00)
GFR, Estimated: >60 mL/min (ref >60)
Glucose, Bld: 145 mg/dL — ABNORMAL HIGH (ref 70–99)
Potassium: 3.5 mmol/L (ref 3.5–5.1)
Sodium: 137 mmol/L (ref 135–145)
Total Bilirubin: 1 mg/dL (ref 0.3–1.2)
Total Protein: 6.9 g/dL (ref 6.5–8.1)

## 2021-06-29 LAB — CBC WITH DIFFERENTIAL/PLATELET
Abs Immature Granulocytes: 0.06 10*3/uL (ref 0.00–0.07)
Basophils Absolute: 0.1 10*3/uL (ref 0.0–0.1)
Basophils Relative: 1 %
Eosinophils Absolute: 0.5 10*3/uL (ref 0.0–0.5)
Eosinophils Relative: 3 %
HCT: 38.9 % (ref 36.0–46.0)
Hemoglobin: 13.1 g/dL (ref 12.0–15.0)
Immature Granulocytes: 0 %
Lymphocytes Relative: 15 %
Lymphs Abs: 2.6 10*3/uL (ref 0.7–4.0)
MCH: 29.6 pg (ref 26.0–34.0)
MCHC: 33.7 g/dL (ref 30.0–36.0)
MCV: 87.8 fL (ref 80.0–100.0)
Monocytes Absolute: 1.4 10*3/uL — ABNORMAL HIGH (ref 0.1–1.0)
Monocytes Relative: 8 %
Neutro Abs: 12.7 10*3/uL — ABNORMAL HIGH (ref 1.7–7.7)
Neutrophils Relative %: 73 %
Platelets: 313 10*3/uL (ref 150–400)
RBC: 4.43 MIL/uL (ref 3.87–5.11)
RDW: 13.7 % (ref 11.5–15.5)
WBC: 17.3 10*3/uL — ABNORMAL HIGH (ref 4.0–10.5)
nRBC: 0 % (ref 0.0–0.2)

## 2021-06-29 LAB — PROCALCITONIN: Procalcitonin: 0.21 ng/mL

## 2021-06-29 LAB — C-REACTIVE PROTEIN: CRP: 19.8 mg/dL — ABNORMAL HIGH (ref <1.0)

## 2021-06-29 LAB — MAGNESIUM: Magnesium: 1.8 mg/dL (ref 1.7–2.4)

## 2021-06-29 LAB — LACTIC ACID, PLASMA: Lactic Acid, Venous: 1.2 mmol/L (ref 0.5–1.9)

## 2021-06-29 MED ORDER — POTASSIUM CHLORIDE CRYS ER 20 MEQ PO TBCR
40.0000 meq | EXTENDED_RELEASE_TABLET | ORAL | Status: AC
  Administered 2021-06-29 (×2): 40 meq via ORAL
  Filled 2021-06-29: qty 4
  Filled 2021-06-29: qty 2

## 2021-06-29 NOTE — Progress Notes (Addendum)
PROGRESS NOTE    Tanya Cole   UJW:[id number]  DOB: December 29, 1932  PCP: Dale Boerne, MD    DOA: 06/27/2021 LOS: 1    Brief Narrative / Hospital Course to Date:   86 y.o. female with PMHx of CAD s/p PCI and stent, HFpEF, asthma, type 2 diabetes mellitus, HTN, HLD, polymyalgia rheumatica, and reactive airway disease, who presented to the ER with midsternal chest pain, mild dry cough.    Troponin trended and negative x 3.   EKG without acute ischemic changes.  ACS ruled out.  Patient tested positive for Covid-19.  Chest xray negative for pneumonia.  She did require 3 L/min supplemental oxygen on day 1 of hospitalization.  Has been weaned to room air.  Patient having some hospital delirium and requiring sitter for safety when family not able to be present.  Assessment & Plan   Active Problems:   Hypertension   Hypercholesterolemia   Reactive airway disease   Chest pain   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)   Cough   Anemia   Type 2 diabetes mellitus with cardiac complication (HCC)   Chronic diastolic CHF (congestive heart failure) (HCC)   Acute respiratory failure with hypoxia (HCC)   COVID-19 virus infection   Sepsis due to Covid-19 infection with acute respiratory failure with hypoxia / Cough - hypoxia resolved very quickly. Sepsis criteria not POA, developed overnight 8/29-30 with fever and tachycardia in setting of Covid-19. Check procal, lactate, UA.   Repeat CXR. Continue remdesivir.  Steroids dc'd as hypoxia was very limited, no wheezing on exam, and avoid worsened mental status in pt w/ dementia and delirium. Supplement O2 PRN to keep sats 90%+ Continue ASA, vitamins Supportive care: bronchodilators, mucoltyics  Chest pain - ACS ruled out.  Likely due to cough with Covid infection.  Cardiology was consulted on admission and advised Echo to compare to prior and evaluate for pericardial effusion. Continue ASA.,  sublingual nitro PRN.  Chronic diastolic CHF - prior Echo in Oct 2021 with EF 60-65%, grade II diastolic dysfunction.   Pt appears compensated, euvolemic.   Monitor volume status.  Echo 06/28/21 - EF 55-60%, indeterminate diastolic parameters, mild-mod MR, mildly elevated PASP. No pericardial effusion.  History of asthma /reactive airway disease -not acutely exacerbated; patient is without wheezing on exam.   On scheduled Combivent and as needed albuterol.   Monitor closely.  AKI - mild, POA with creatinine 1.2, baseline 0.6-0.8. Resolved with IV hydration. Monitor BMP.  Essential hypertension -continue Lopressor.   Zestril held on admission due to AKI - resumed. - pt overall normotensive w intermittently soft BP's.  Type 2 diabetes -sliding scale NovoLog.  Hold metformin  Hypokalemia -on statin  Hospital delirium superimposed on dementia  Sitter for safety if family not present. --Delirium precautions:     -Lights and TV off, minimize interruptions at night    -Blinds open and lights on during day    -Glasses/hearing aid with patient    -Frequent reorientation    -PT/OT when able    -Avoid sedation medications/Beers list medications --Haldol PRN, use if severe persistent agitation only  Patient BMI: Body mass index is 22.3 kg/m.   DVT prophylaxis: enoxaparin (LOVENOX) injection 40 mg Start: 06/29/21 0800   Diet:  Diet Orders (From admission, onward)     Start     Ordered   06/27/21 1348  Diet Heart Room service appropriate? Yes; Fluid consistency: Thin  Diet effective now  Question Answer Comment  Room service appropriate? Yes   Fluid consistency: Thin      06/27/21 1347              Code Status: DNR   Subjective 06/29/21    Patient seen at bedside this morning.  Sitter present for safety.  Pt in much better spirits today.  She did have a fever overnight, afebrile this AM and says she's feeling well.  No acute events reported.  She denies SOB, CP,  N/V/D or other complaints.    Disposition Plan & Communication   Status is: Inpatient  Remains inpatient appropriate because:IV treatments appropriate due to intensity of illness or inability to take PO  Dispo: The patient is from: Home              Anticipated d/c is to: Home              Patient currently is not medically stable to d/c.   Difficult to place patient No   Family Communication: Spoke with patient's daughter Rinaldo Cloud by phone 8/29   Consults, Procedures, Significant Events   Consultants:  Cardiology  Procedures:  Echo 8/29, see findings above  Antimicrobials:  Anti-infectives (From admission, onward)    Start     Dose/Rate Route Frequency Ordered Stop   06/28/21 1000  remdesivir 100 mg in sodium chloride 0.9 % 100 mL IVPB       See Hyperspace for full Linked Orders Report.   100 mg 200 mL/hr over 30 Minutes Intravenous Daily 06/27/21 0447 07/02/21 0959   06/27/21 0600  remdesivir 200 mg in sodium chloride 0.9% 250 mL IVPB       See Hyperspace for full Linked Orders Report.   200 mg 580 mL/hr over 30 Minutes Intravenous Once 06/27/21 0447 06/27/21 0729         Micro    Objective   Vitals:   06/29/21 0425 06/29/21 0743 06/29/21 1126 06/29/21 1521  BP: 130/73 (!) 136/51 111/62 (!) 92/51  Pulse: (!) 106 (!) 107 76 94  Resp: 18 16 18 18   Temp: 100 F (37.8 C) 98.8 F (37.1 C) 98.9 F (37.2 C) 97.9 F (36.6 C)  TempSrc: Oral     SpO2: 96% 94% 95% 94%  Weight:      Height:        Intake/Output Summary (Last 24 hours) at 06/29/2021 1651 Last data filed at 06/29/2021 1000 Gross per 24 hour  Intake 1040.12 ml  Output --  Net 1040.12 ml   Filed Weights   06/26/21 1946  Weight: 53.5 kg    Physical Exam:  General exam: awake, alert, no acute distress, in good spirits Respiratory system: CTAB, no wheezes, rales or rhonchi, normal respiratory effort, on room air. Cardiovascular system: normal S1/S2, RRR, no pedal edema.   Gastrointestinal  system: soft, nontender abdomen  Central nervous system: no gross focal neurologic deficits, normal speech Extremities: no edema, no cyanosis Skin: dry, intact, normal temperature Psychiatry: normal mood, congruent affect  Labs   Data Reviewed: I have personally reviewed following labs and imaging studies  CBC: Recent Labs  Lab 06/26/21 1951 06/28/21 0456 06/29/21 0458  WBC 18.0* 15.0* 17.3*  NEUTROABS  --  12.1* 12.7*  HGB 12.9 12.6 13.1  HCT 38.5 37.4 38.9  MCV 89.7 89.7 87.8  PLT 258 235 313   Basic Metabolic Panel: Recent Labs  Lab 06/26/21 1951 06/28/21 0456 06/29/21 0458  NA 136 136 137  K 3.7 3.5  3.5  CL 102 104 106  CO2 23 21* 22  GLUCOSE 153* 157* 145*  BUN 25* 20 19  CREATININE 1.22* 0.63 0.80  CALCIUM 8.8* 8.7* 8.7*  MG  --  1.7 1.8   GFR: Estimated Creatinine Clearance: 38.1 mL/min (by C-G formula based on SCr of 0.8 mg/dL). Liver Function Tests: Recent Labs  Lab 06/28/21 0456 06/29/21 0458  AST 13* 13*  ALT 10 10  ALKPHOS 70 82  BILITOT 1.5* 1.0  PROT 6.6 6.9  ALBUMIN 3.5 3.6   No results for input(s): LIPASE, AMYLASE in the last 168 hours. No results for input(s): AMMONIA in the last 168 hours. Coagulation Profile: No results for input(s): INR, PROTIME in the last 168 hours. Cardiac Enzymes: No results for input(s): CKTOTAL, CKMB, CKMBINDEX, TROPONINI in the last 168 hours. BNP (last 3 results) No results for input(s): PROBNP in the last 8760 hours. HbA1C: No results for input(s): HGBA1C in the last 72 hours. CBG: Recent Labs  Lab 06/27/21 2158 06/28/21 2130  GLUCAP 125* 177*   Lipid Profile: Recent Labs    06/27/21 0411  CHOL 175  HDL 44  LDLCALC 108*  TRIG 113  CHOLHDL 4.0   Thyroid Function Tests: No results for input(s): TSH, T4TOTAL, FREET4, T3FREE, THYROIDAB in the last 72 hours. Anemia Panel: Recent Labs    06/27/21 0531 06/28/21 0456  FERRITIN 106 159   Sepsis Labs: Recent Labs  Lab 06/27/21 0531  06/29/21 0458 06/29/21 0912  PROCALCITON <0.10 0.21  --   LATICACIDVEN  --   --  1.2    Recent Results (from the past 240 hour(s))  Resp Panel by RT-PCR (Flu A&B, Covid) Nasopharyngeal Swab     Status: Abnormal   Collection Time: 06/27/21  1:56 AM   Specimen: Nasopharyngeal Swab; Nasopharyngeal(NP) swabs in vial transport medium  Result Value Ref Range Status   SARS Coronavirus 2 by RT PCR POSITIVE (A) NEGATIVE Final    Comment: RESULT CALLED TO, READ BACK BY AND VERIFIED WITH: 0319 06/27/21 APRIL BRUMGARD LFD (NOTE) SARS-CoV-2 target nucleic acids are DETECTED.  The SARS-CoV-2 RNA is generally detectable in upper respiratory specimens during the acute phase of infection. Positive results are indicative of the presence of the identified virus, but do not rule out bacterial infection or co-infection with other pathogens not detected by the test. Clinical correlation with patient history and other diagnostic information is necessary to determine patient infection status. The expected result is Negative.  Fact Sheet for Patients: BloggerCourse.com  Fact Sheet for Healthcare Providers: SeriousBroker.it  This test is not yet approved or cleared by the [place] FDA and  has been authorized for detection and/or diagnosis of SARS-CoV-2 by FDA under an Emergency Use Authorization (EUA).  This EUA will remain in effect (meaning this test can be  used) for the duration of  the COVID-19 declaration under Section 564(b)(1) of the Act, 21 U.S.C. section 360bbb-3(b)(1), unless the authorization is terminated or revoked sooner.     Influenza A by PCR NEGATIVE NEGATIVE Final   Influenza B by PCR NEGATIVE NEGATIVE Final    Comment: (NOTE) The Xpert Xpress SARS-CoV-2/FLU/RSV plus assay is intended as an aid in the diagnosis of influenza from Nasopharyngeal swab specimens and should not be used as a sole basis for treatment. Nasal  washings and aspirates are unacceptable for Xpert Xpress SARS-CoV-2/FLU/RSV testing.  Fact Sheet for Patients: BloggerCourse.com  Fact Sheet for Healthcare Providers: SeriousBroker.it  This test is not yet approved or cleared  by the [place] and has been authorized for detection and/or diagnosis of SARS-CoV-2 by FDA under an Emergency Use Authorization (EUA). This EUA will remain in effect (meaning this test can be used) for the duration of the COVID-19 declaration under Section 564(b)(1) of the Act, 21 U.S.C. section 360bbb-3(b)(1), unless the authorization is terminated or revoked.  Performed at [facility], [address] [place], [place] [address]       Imaging Studies   ECHOCARDIOGRAM COMPLETE  Result Date: 06/28/2021    ECHOCARDIOGRAM REPORT   Patient Name:   Tanya [facility] Date of Exam: 06/28/2021 Medical Rec #:  [id number]          Height:       61.0 in Accession #:    [id number]         Weight:       118.0 lb Date of Birth:  [birth year]          BSA:          1.509 m Patient Age:    88 years           BP:           130/57 mmHg Patient Gender: F                  HR:           92 bpm. Exam Location:  [facility] Procedure: 2D Echo, Color Doppler and Cardiac Doppler Indications:     R07.9 Chest Pain  History:         Patient has prior history of Echocardiogram examinations, most                  recent 08/08/2020. HFpEF and CHF, CAD; Risk Factors:Diabetes,                  Hypertension and HCL. Pt tested positive for COVID-19 on                  06/27/21.  Sonographer:     Humphrey Rolls Referring Phys:  [id number] Raymon Mutton DUNN Diagnosing Phys: Lorine Bears MD IMPRESSIONS  1. Left ventricular ejection fraction, by estimation, is 55 to 60%. The left ventricle has normal function. The left ventricle has no regional wall motion abnormalities. Left ventricular diastolic parameters are indeterminate.  2. Right ventricular systolic  function is normal. The right ventricular size is normal. There is mildly elevated pulmonary artery systolic pressure.  3. The mitral valve is normal in structure. Mild to moderate mitral valve regurgitation. No evidence of mitral stenosis.  4. The aortic valve is normal in structure. Aortic valve regurgitation is not visualized. Mild aortic valve sclerosis is present, with no evidence of aortic valve stenosis.  5. The inferior vena cava is normal in size with greater than 50% respiratory variability, suggesting right atrial pressure of 3 mmHg. FINDINGS  Left Ventricle: Left ventricular ejection fraction, by estimation, is 55 to 60%. The left ventricle has normal function. The left ventricle has no regional wall motion abnormalities. The left ventricular internal cavity size was normal in size. There is  no left ventricular hypertrophy. Left ventricular diastolic parameters are indeterminate. Right Ventricle: The right ventricular size is normal. No increase in right ventricular wall thickness. Right ventricular systolic function is normal. There is mildly elevated pulmonary artery systolic pressure. The tricuspid regurgitant velocity is 2.87  m/s, and with an assumed right atrial pressure of 3 mmHg, the estimated right ventricular systolic  pressure is 35.9 mmHg. Left Atrium: Left atrial size was normal in size. Right Atrium: Right atrial size was normal in size. Pericardium: There is no evidence of pericardial effusion. Mitral Valve: The mitral valve is normal in structure. Mild mitral annular calcification. Mild to moderate mitral valve regurgitation, with posteriorly-directed jet. No evidence of mitral valve stenosis. MV peak gradient, 4.8 mmHg. The mean mitral valve gradient is 2.0 mmHg. Tricuspid Valve: The tricuspid valve is normal in structure. Tricuspid valve regurgitation is not demonstrated. No evidence of tricuspid stenosis. Aortic Valve: The aortic valve is normal in structure. Aortic valve regurgitation  is not visualized. Mild aortic valve sclerosis is present, with no evidence of aortic valve stenosis. Aortic valve mean gradient measures 7.0 mmHg. Aortic valve peak gradient measures 11.0 mmHg. Aortic valve area, by VTI measures 2.58 cm. Pulmonic Valve: The pulmonic valve was normal in structure. Pulmonic valve regurgitation is mild. No evidence of pulmonic stenosis. Aorta: The aortic root is normal in size and structure. Venous: The inferior vena cava was not well visualized. The inferior vena cava is normal in size with greater than 50% respiratory variability, suggesting right atrial pressure of 3 mmHg. IAS/Shunts: No atrial level shunt detected by color flow Doppler.  LEFT VENTRICLE PLAX 2D LVIDd:         3.90 cm  Diastology LVIDs:         2.40 cm  LV e' medial:    5.44 cm/s LV PW:         0.90 cm  LV E/e' medial:  19.5 LV IVS:        1.00 cm  LV e' lateral:   9.46 cm/s LVOT diam:     2.30 cm  LV E/e' lateral: 11.2 LV SV:         70 LV SV Index:   47 LVOT Area:     4.15 cm  RIGHT VENTRICLE RV Basal diam:  2.90 cm LEFT ATRIUM             Index       RIGHT ATRIUM           Index LA diam:        3.80 cm 2.52 cm/m  RA Area:     11.40 cm LA Vol (A2C):   36.4 ml 24.12 ml/m RA Volume:   22.70 ml  15.04 ml/m LA Vol (A4C):   50.0 ml 33.13 ml/m LA Biplane Vol: 44.4 ml 29.42 ml/m  AORTIC VALVE                    PULMONIC VALVE AV Area (Vmax):    2.30 cm     PV Vmax:       0.89 m/s AV Area (Vmean):   2.23 cm     PV Vmean:      60.700 cm/s AV Area (VTI):     2.58 cm     PV VTI:        0.164 m AV Vmax:           166.00 cm/s  PV Peak grad:  3.2 mmHg AV Vmean:          122.000 cm/s PV Mean grad:  2.0 mmHg AV VTI:            0.272 m AV Peak Grad:      11.0 mmHg AV Mean Grad:      7.0 mmHg LVOT Vmax:         92.00 cm/s  LVOT Vmean:        65.600 cm/s LVOT VTI:          0.169 m LVOT/AV VTI ratio: 0.62  AORTA Ao Root diam: 2.70 cm MITRAL VALVE                TRICUSPID VALVE MV Area (PHT): 3.89 cm     TR Peak grad:    32.9 mmHg MV Area VTI:   2.53 cm     TR Vmax:        287.00 cm/s MV Peak grad:  4.8 mmHg MV Mean grad:  2.0 mmHg     SHUNTS MV Vmax:       1.10 m/s     Systemic VTI:  0.17 m MV Vmean:      67.2 cm/s    Systemic Diam: 2.30 cm MV Decel Time: 195 msec MV E velocity: 106.00 cm/s MV A velocity: 102.00 cm/s MV E/A ratio:  1.04 Lorine Bears MD Electronically signed by Lorine Bears MD Signature Date/Time: 06/28/2021/1:14:56 PM    Final      Medications   Scheduled Meds:  alum & mag hydroxide-simeth  30 mL Oral Once   And   lidocaine  15 mL Oral Once   vitamin C  500 mg Oral Daily   aspirin EC  81 mg Oral Daily   atorvastatin  80 mg Oral QPM   cholecalciferol  1,000 Units Oral Daily   enoxaparin (LOVENOX) injection  40 mg Subcutaneous Q24H   fluticasone  2 spray Each Nare Daily   guaiFENesin  600 mg Oral BID   Ipratropium-Albuterol  1 puff Inhalation QID   lisinopril  20 mg Oral Daily   metoprolol tartrate  25 mg Oral BID   Vitamin D (Ergocalciferol)  50,000 Units Oral Weekly   zinc sulfate  220 mg Oral Daily   Continuous Infusions:  remdesivir 100 mg in NS 100 mL 100 mg (06/29/21 0823)       LOS: 1 day    Time spent: 30 minutes    Pennie Banter, DO [facility]  06/29/2021, 4:51 PM      If 7PM-7AM, please contact night-coverage. How to contact the [facility] Attending or Consulting provider 7A - 7P or covering provider during after hours 7P -7A, for this patient?    Check the care team in [facility] and look for a) attending/consulting TR[facility] provider listed and b) the [facility] team listed Log into [email or web address] and use [facility]'s universal password to access. If you do not have the password, please contact the hospital operator. Locate the [facility] provider you are looking for under [facility] and page to a number that you can be directly reached. If you still have difficulty reaching the provider, please page the [facility] (Director on Call) for the Hospitalists listed on amion for  assistance.

## 2021-06-29 NOTE — Progress Notes (Signed)
   06/29/21 0009  Assess: MEWS Score  Temp (!) 102.6 F (39.2 C)  BP (!) 154/70  Pulse Rate (!) 108  Resp 18  SpO2 97 %  O2 Device Room Air  Assess: MEWS Score  MEWS Temp 2  MEWS Systolic 0  MEWS Pulse 1  MEWS RR 0  MEWS LOC 0  MEWS Score 3  MEWS Score Color Yellow  Assess: if the MEWS score is Yellow or Red  Were vital signs taken at a resting state? Yes  Focused Assessment No change from prior assessment  Does the patient meet 2 or more of the SIRS criteria? Yes  MEWS guidelines implemented *See Row Information* Yes  Treat  MEWS Interventions Administered prn meds/treatments  Take Vital Signs  Increase Vital Sign Frequency  Yellow: Q 2hr X 2 then Q 4hr X 2, if remains yellow, continue Q 4hrs  Escalate  MEWS: Escalate Yellow: discuss with charge nurse/RN and consider discussing with provider and RRT  Notify: Charge Nurse/RN  Name of Charge Nurse/RN Notified Marcel, RN  Date Charge Nurse/RN Notified 06/29/21  Time Charge Nurse/RN Notified 0017  Notify: Provider  Provider Name/Title Jon Billings, NP  Date Provider Notified 06/29/21  Time Provider Notified [phone]  Notification Type Page  Notification Reason Other (Comment) (Elevated temp, Yellow MEWS)  Provider response No new orders;Other (Comment) (recheck v/s in 1 hour)  Date of Provider Response 06/29/21  Time of Provider Response 0028  Assess: SIRS CRITERIA  SIRS Temperature  1  SIRS Pulse 1  SIRS Respirations  0  SIRS WBC 0  SIRS Score Sum  2

## 2021-06-29 NOTE — Plan of Care (Signed)
  Problem: Education: Goal: Knowledge of General Education information will improve Description Including pain rating scale, medication(s)/side effects and non-pharmacologic comfort measures Outcome: Progressing   Problem: Health Behavior/Discharge Planning: Goal: Ability to manage health-related needs will improve Outcome: Progressing   

## 2021-06-30 ENCOUNTER — Telehealth: Payer: Medicare HMO

## 2021-06-30 ENCOUNTER — Telehealth: Payer: Self-pay | Admitting: *Deleted

## 2021-06-30 DIAGNOSIS — I5032 Chronic diastolic (congestive) heart failure: Secondary | ICD-10-CM | POA: Diagnosis not present

## 2021-06-30 DIAGNOSIS — J441 Chronic obstructive pulmonary disease with (acute) exacerbation: Secondary | ICD-10-CM

## 2021-06-30 DIAGNOSIS — E1159 Type 2 diabetes mellitus with other circulatory complications: Secondary | ICD-10-CM | POA: Diagnosis not present

## 2021-06-30 DIAGNOSIS — J449 Chronic obstructive pulmonary disease, unspecified: Secondary | ICD-10-CM

## 2021-06-30 LAB — C-REACTIVE PROTEIN: CRP: 19.9 mg/dL — ABNORMAL HIGH (ref <1.0)

## 2021-06-30 LAB — CBC WITH DIFFERENTIAL/PLATELET
Abs Immature Granulocytes: 0.07 10*3/uL (ref 0.00–0.07)
Basophils Absolute: 0.1 10*3/uL (ref 0.0–0.1)
Basophils Relative: 0 %
Eosinophils Absolute: 0.8 10*3/uL — ABNORMAL HIGH (ref 0.0–0.5)
Eosinophils Relative: 5 %
HCT: 36.5 % (ref 36.0–46.0)
Hemoglobin: 12 g/dL (ref 12.0–15.0)
Immature Granulocytes: 0 %
Lymphocytes Relative: 11 %
Lymphs Abs: 1.7 10*3/uL (ref 0.7–4.0)
MCH: 29.7 pg (ref 26.0–34.0)
MCHC: 32.9 g/dL (ref 30.0–36.0)
MCV: 90.3 fL (ref 80.0–100.0)
Monocytes Absolute: 1.2 10*3/uL — ABNORMAL HIGH (ref 0.1–1.0)
Monocytes Relative: 7 %
Neutro Abs: 12.4 10*3/uL — ABNORMAL HIGH (ref 1.7–7.7)
Neutrophils Relative %: 77 %
Platelets: 315 10*3/uL (ref 150–400)
RBC: 4.04 MIL/uL (ref 3.87–5.11)
RDW: 14.1 % (ref 11.5–15.5)
WBC: 16.1 10*3/uL — ABNORMAL HIGH (ref 4.0–10.5)
nRBC: 0 % (ref 0.0–0.2)

## 2021-06-30 LAB — COMPREHENSIVE METABOLIC PANEL
ALT: 9 U/L (ref 0–44)
AST: 12 U/L — ABNORMAL LOW (ref 15–41)
Albumin: 3.3 g/dL — ABNORMAL LOW (ref 3.5–5.0)
Alkaline Phosphatase: 73 U/L (ref 38–126)
Anion gap: 6 (ref 5–15)
BUN: 33 mg/dL — ABNORMAL HIGH (ref 8–23)
CO2: 21 mmol/L — ABNORMAL LOW (ref 22–32)
Calcium: 8.6 mg/dL — ABNORMAL LOW (ref 8.9–10.3)
Chloride: 109 mmol/L (ref 98–111)
Creatinine, Ser: 1.01 mg/dL — ABNORMAL HIGH (ref 0.44–1.00)
GFR, Estimated: 54 mL/min — ABNORMAL LOW (ref >60)
Glucose, Bld: 153 mg/dL — ABNORMAL HIGH (ref 70–99)
Potassium: 4.9 mmol/L (ref 3.5–5.1)
Sodium: 136 mmol/L (ref 135–145)
Total Bilirubin: 0.8 mg/dL (ref 0.3–1.2)
Total Protein: 6.3 g/dL — ABNORMAL LOW (ref 6.5–8.1)

## 2021-06-30 LAB — MAGNESIUM: Magnesium: 1.7 mg/dL (ref 1.7–2.4)

## 2021-06-30 LAB — PROCALCITONIN: Procalcitonin: 0.2 ng/mL

## 2021-06-30 LAB — D-DIMER, QUANTITATIVE: D-Dimer, Quant: 2.65 ug/mL-FEU — ABNORMAL HIGH (ref 0.00–0.50)

## 2021-06-30 MED ORDER — METHYLPREDNISOLONE SODIUM SUCC 40 MG IJ SOLR
40.0000 mg | Freq: Every day | INTRAMUSCULAR | Status: DC
  Administered 2021-06-30 – 2021-07-02 (×3): 40 mg via INTRAVENOUS
  Filled 2021-06-30 (×3): qty 1

## 2021-06-30 NOTE — Progress Notes (Signed)
Occupational Therapy Treatment Patient Details Name: Tanya Cole MRN: [id number] DOB: 06-02-1933 Today's Date: 06/30/2021    History of present illness Tanya Cole is an 88yoF who comes to [facility] on 8/28 c CP. CAD s/p PCI to the LAD in 09/2018 in the setting of a NSTEMI with recurrent NSTEMI in 07/2020 medically managed given distal LAD involvement not amenable to PCI, atrial tachycardia, Covid in 12/2020, COPD in the setting of extensive second-hand smoke exposure and worked in a [employer] for 29 years with recurrent admissions for exacerbations, DM, acute blood loss anemia, HTN, HLD, and osteoporosis who was admitted on 8/28 with recurrent Covid. Cardiology consulted 06/27/2021 for chest pain. HS-Tn negative x 4. D-dimer elevated with CTA chest negative for PE. Patient Covid positive. ACS felt to be unlikely at time of consult. Obtain echo to evaluate for significant changes when compared to prior study in 07/2020 and pericardial effusion.   OT comments  Upon entering the room, pt supine in bed and sleeping soundly with sitter present in the room. Pt awakens easily for therapeutic intervention and requests to go to bathroom. Pt performing bed mobility without assistance. Pt standing with min A and ambulating with min guard without use of AD to bathroom for toileting needs. While seated, pt changing brief with assistance to thread onto B feet. Pt standing for hygiene and clothing management with min guard. Pt then at sink for oral care, hand hygiene, and combing hair with min guard for balance and standing for ~ 10 minutes. Pt returning to bed in same manner and returning to bed to rest. She does get emotional when orienting to date and she reports, "today is my mom's birthday".    Follow Up Recommendations  Home health OT;Supervision/Assistance - 24 hour    Equipment Recommendations  None recommended by OT       Precautions / Restrictions Precautions Precautions:  Fall Restrictions Weight Bearing Restrictions: No       Mobility Bed Mobility Overal bed mobility: Needs Assistance Bed Mobility: Supine to Sit;Sit to Supine     Supine to sit: Supervision Sit to supine: Modified independent (Device/Increase time)   General bed mobility comments: no physical assistance provided    Transfers Overall transfer level: Needs assistance Equipment used: 1 person hand held assist Transfers: Sit to/from Stand Sit to Stand: Min guard Stand pivot transfers: Min guard       General transfer comment: assist to power up to standing, however sitter reports she just woke up    Balance Overall balance assessment: Needs assistance Sitting-balance support: Feet supported Sitting balance-Leahy Scale: Good     Standing balance support: Bilateral upper extremity supported;During functional activity Standing balance-Leahy Scale: Fair Standing balance comment: UE support for balance                           ADL either performed or assessed with clinical judgement   ADL Overall ADL's : Needs assistance/impaired     Grooming: Wash/dry hands;Wash/dry face;Min guard;Standing;Set up;Oral care               Lower Body Dressing: Minimal assistance Lower Body Dressing Details (indicate cue type and reason): to change briefs while seated on commode Toilet Transfer: Min guard;Comfort height toilet;Ambulation   Toileting- Clothing Manipulation and Hygiene: Min guard;Sit to/from stand       Functional mobility during ADLs: Min guard General ADL Comments: min guard assist for balance without use of AD for  functional mobility, standing tasks, and self care.     Vision Patient Visual Report: No change from baseline            Cognition Arousal/Alertness: Awake/alert Behavior During Therapy: WFL for tasks assessed/performed Overall Cognitive Status: Impaired/Different from baseline                                 General  Comments: Pt was tearful at one point when orienting pt to date and she replied it was her mother's birthday. Pt very pleasant overall and cooperative. Min cuing for safety awareness.                   Pertinent Vitals/ Pain       Pain Assessment: No/denies pain         Frequency  Min 2X/week        Progress Toward Goals  OT Goals(current goals can now be found in the care plan section)  Progress towards OT goals: Progressing toward goals  Acute Rehab OT Goals Patient Stated Goal: to go home OT Goal Formulation: With patient/family Time For Goal Achievement: 07/12/21 Potential to Achieve Goals: Good  Plan Discharge plan remains appropriate;Frequency remains appropriate       AM-PAC OT "6 Clicks" Daily Activity     Outcome Measure   Help from another person eating meals?: None Help from another person taking care of personal grooming?: None Help from another person toileting, which includes using toliet, bedpan, or urinal?: A Little Help from another person bathing (including washing, rinsing, drying)?: A Little Help from another person to put on and taking off regular upper body clothing?: None Help from another person to put on and taking off regular lower body clothing?: A Little 6 Click Score: 21    End of Session    OT Visit Diagnosis: Unsteadiness on feet (R26.81);Muscle weakness (generalized) (M62.81)   Activity Tolerance Patient tolerated treatment well   Patient Left in bed;with call bell/phone within reach;with bed alarm set;with nursing/sitter in room;with family/visitor present   Nurse Communication Mobility status        Time: 3007-6226 OT Time Calculation (min): 27 min  Charges: OT General Charges $OT Visit: 1 Visit OT Treatments $Self Care/Home Management : 23-37 mins  Jackquline Denmark, MS, OTR/L , CBIS ascom [phone]  06/30/21, 2:50 PM

## 2021-06-30 NOTE — Progress Notes (Signed)
[facility] PROGRESS NOTE    Tanya Cole  NID:[id number] DOB: 04-09-1933 DOA: 06/27/2021 PCP: Dale Ballard, MD      Brief Narrative:  Tanya Cole is a 87 y.o. F with dCHF, CAD, DM, HTN and PMR who presented with cough, pleuritic chest pain.  In the ER-chest x-ray clear, COVID-19 positive.  Initially was requiring 3 L oxygen         Assessment & Plan:  COVID-19 pneumonitis Patient's chest x-ray yesterday showed increasing infiltrates, her CRP remains very elevated. - Start Solu-Medrol   COVID-19 encephalopathy Confused, likely due to COVID.  Chronic diastolic CHF Coronary artery disease Hypertension Blood pressure normal. - Continue atorvastatin, aspirin - Continue lisinopril  Diabetes Chart history, glucose here normal.         Disposition: Status is: Inpatient  Remains inpatient appropriate because:IV treatments appropriate due to intensity of illness or inability to take PO  Dispo: The patient is from: Home              Anticipated d/c is to: Home              Patient currently is not medically stable to d/c.   Difficult to place patient No       Level of care: Med-Surg       MDM: The below labs and imaging reports were reviewed and summarized above.  Medication management as above.    DVT prophylaxis: enoxaparin (LOVENOX) injection 40 mg Start: 06/29/21 0800  Code Status: DNR Family Communication: Called to daughter jennifer, no answer          Subjective: She is crying.  Says she wants to go home.  Otherwise does not answer questions.  .  No fever, chest pain, respiratory distress.  Objective: Vitals:   06/30/21 0540 06/30/21 0728 06/30/21 1139 06/30/21 1500  BP: 123/60 (!) 117/43 (!) 103/54 (!) 112/58  Pulse: 87 91 69 76  Resp: 20 17 17 18   Temp: 98.8 F (37.1 C) 99.1 F (37.3 C) 98.8 F (37.1 C) 98 F (36.7 C)  TempSrc: Oral Oral Oral Oral  SpO2: 95% 92% 95% 99%  Weight:      Height:         Intake/Output Summary (Last 24 hours) at 06/30/2021 1648 Last data filed at 06/30/2021 1500 Gross per 24 hour  Intake 560 ml  Output --  Net 560 ml   Filed Weights   06/26/21 1946  Weight: 53.5 kg    Examination: General appearance: Elderly adult female, awake, in no distress.   HEENT: Anicteric, conjunctiva pink, lids and lashes normal. No nasal deformity, discharge, epistaxis.  Lips moist.   Skin: Warm and dry.  no jaundice.  No suspicious rashes or lesions. Cardiac: RRR, nl S1-S2, no murmurs appreciated.  Capillary refill is brisk.  No lower extremity edema Respiratory: Normal respiratory rate and rhythm.  Lung sounds diminished, wheezing bilaterally Abdomen: Abdomen soft.  no TTP. No ascites, distension, hepatosplenomegaly.   MSK: No deformities or effusions. Neuro: Awake but not very responsive.  EOMI, moves all extremities generalized weakness, mostly does not follow commands. Speech fluent.    Psych: Sensorium intact and responding to questions, attention diminished, affect blunted, judgment insight appear impaired      Data Reviewed: I have personally reviewed following labs and imaging studies:  CBC: Recent Labs  Lab 06/26/21 1951 06/28/21 0456 06/29/21 0458 06/30/21 0427  WBC 18.0* 15.0* 17.3* 16.1*  NEUTROABS  --  12.1* 12.7* 12.4*  HGB 12.9 12.6 13.1 12.0  HCT 38.5 37.4 38.9 36.5  MCV 89.7 89.7 87.8 90.3  PLT 258 235 313 315   Basic Metabolic Panel: Recent Labs  Lab 06/26/21 1951 06/28/21 0456 06/29/21 0458 06/30/21 0427  NA 136 136 137 136  K 3.7 3.5 3.5 4.9  CL 102 104 106 109  CO2 23 21* 22 21*  GLUCOSE 153* 157* 145* 153*  BUN 25* 20 19 33*  CREATININE 1.22* 0.63 0.80 1.01*  CALCIUM 8.8* 8.7* 8.7* 8.6*  MG  --  1.7 1.8 1.7   GFR: Estimated Creatinine Clearance: 30.2 mL/min (A) (by C-G formula based on SCr of 1.01 mg/dL (H)). Liver Function Tests: Recent Labs  Lab 06/28/21 0456 06/29/21 0458 06/30/21 0427  AST 13* 13* 12*  ALT  10 10 9   ALKPHOS 70 82 73  BILITOT 1.5* 1.0 0.8  PROT 6.6 6.9 6.3*  ALBUMIN 3.5 3.6 3.3*   No results for input(s): LIPASE, AMYLASE in the last 168 hours. No results for input(s): AMMONIA in the last 168 hours. Coagulation Profile: No results for input(s): INR, PROTIME in the last 168 hours. Cardiac Enzymes: No results for input(s): CKTOTAL, CKMB, CKMBINDEX, TROPONINI in the last 168 hours. BNP (last 3 results) No results for input(s): PROBNP in the last 8760 hours. HbA1C: No results for input(s): HGBA1C in the last 72 hours. CBG: Recent Labs  Lab 06/27/21 2158 06/28/21 2130  GLUCAP 125* 177*   Lipid Profile: No results for input(s): CHOL, HDL, LDLCALC, TRIG, CHOLHDL, LDLDIRECT in the last 72 hours. Thyroid Function Tests: No results for input(s): TSH, T4TOTAL, FREET4, T3FREE, THYROIDAB in the last 72 hours. Anemia Panel: Recent Labs    06/28/21 0456  FERRITIN 159   Urine analysis:    Component Value Date/Time   COLORURINE YELLOW (A) 03/18/2021 1019   APPEARANCEUR CLOUDY (A) 03/18/2021 1019   APPEARANCEUR Clear 02/18/2013 2004   LABSPEC 1.021 03/18/2021 1019   LABSPEC 1.024 02/18/2013 2004   PHURINE 5.0 03/18/2021 1019   GLUCOSEU NEGATIVE 03/18/2021 1019   GLUCOSEU NEGATIVE 01/28/2015 0810   HGBUR NEGATIVE 03/18/2021 1019   BILIRUBINUR NEGATIVE 03/18/2021 1019   BILIRUBINUR neg 01/16/2015 1437   BILIRUBINUR Negative 02/18/2013 2004   KETONESUR NEGATIVE 03/18/2021 1019   PROTEINUR NEGATIVE 03/18/2021 1019   UROBILINOGEN 0.2 01/28/2015 0810   NITRITE NEGATIVE 03/18/2021 1019   LEUKOCYTESUR TRACE (A) 03/18/2021 1019   LEUKOCYTESUR Trace 02/18/2013 2004   Sepsis Labs: @LABRCNTIP (procalcitonin:4,lacticacidven:4)  ) Recent Results (from the past 240 hour(s))  Resp Panel by RT-PCR (Flu A&B, Covid) Nasopharyngeal Swab     Status: Abnormal   Collection Time: 06/27/21  1:56 AM   Specimen: Nasopharyngeal Swab; Nasopharyngeal(NP) swabs in vial transport medium   Result Value Ref Range Status   SARS Coronavirus 2 by RT PCR POSITIVE (A) NEGATIVE Final    Comment: RESULT CALLED TO, READ BACK BY AND VERIFIED WITH: 0319 06/27/21 APRIL BRUMGARD LFD (NOTE) SARS-CoV-2 target nucleic acids are DETECTED.  The SARS-CoV-2 RNA is generally detectable in upper respiratory specimens during the acute phase of infection. Positive results are indicative of the presence of the identified virus, but do not rule out bacterial infection or co-infection with other pathogens not detected by the test. Clinical correlation with patient history and other diagnostic information is necessary to determine patient infection status. The expected result is Negative.  Fact Sheet for Patients: BloggerCourse.com  Fact Sheet for Healthcare Providers: SeriousBroker.it  This test is not yet approved or cleared by the  [place] [employer] trader and  has been authorized for detection and/or diagnosis of SARS-CoV-2 by FDA under an TEFL teacher (EUA).  This EUA will remain in effect (meaning this test can be  used) for the duration of  the COVID-19 declaration under Section 564(b)(1) of the Act, 21 U.S.C. section 360bbb-3(b)(1), unless the authorization is terminated or revoked sooner.     Influenza A by PCR NEGATIVE NEGATIVE Final   Influenza B by PCR NEGATIVE NEGATIVE Final    Comment: (NOTE) The Xpert Xpress SARS-CoV-2/FLU/RSV plus assay is intended as an aid in the diagnosis of influenza from Nasopharyngeal swab specimens and should not be used as a sole basis for treatment. Nasal washings and aspirates are unacceptable for Xpert Xpress SARS-CoV-2/FLU/RSV testing.  Fact Sheet for Patients: BloggerCourse.com  Fact Sheet for Healthcare Providers: SeriousBroker.it  This test is not yet approved or cleared by the [place] FDA and has been authorized for  detection and/or diagnosis of SARS-CoV-2 by FDA under an Emergency Use Authorization (EUA). This EUA will remain in effect (meaning this test can be used) for the duration of the COVID-19 declaration under Section 564(b)(1) of the Act, 21 U.S.C. section 360bbb-3(b)(1), unless the authorization is terminated or revoked.  Performed at [facility], [address], [place], [place] [address]          Radiology Studies: [facility] Chest [place] 1 View  Result Date: 06/29/2021 CLINICAL DATA:  History of sepsis, COVID positive EXAM: PORTABLE CHEST 1 VIEW COMPARISON:  06/26/2021, 03/01/2021, CT chest 06/27/2021 FINDINGS: No consolidation or effusion. Vague interstitial opacities likely due to inflammatory process. Calcified nodule left base. Stable cardiomediastinal silhouette with aortic atherosclerosis. No pneumothorax is seen IMPRESSION: Mild diffuse increased interstitial opacity suggesting interstitial inflammatory process. No focal airspace disease Electronically Signed   By: Jasmine Pang M.D.   On: 06/29/2021 19:57        Scheduled Meds:  alum & mag hydroxide-simeth  30 mL Oral Once   And   lidocaine  15 mL Oral Once   vitamin C  500 mg Oral Daily   aspirin EC  81 mg Oral Daily   atorvastatin  80 mg Oral QPM   cholecalciferol  1,000 Units Oral Daily   enoxaparin (LOVENOX) injection  40 mg Subcutaneous Q24H   fluticasone  2 spray Each Nare Daily   guaiFENesin  600 mg Oral BID   Ipratropium-Albuterol  1 puff Inhalation QID   lisinopril  20 mg Oral Daily   methylPREDNISolone (SOLU-MEDROL) injection  40 mg Intravenous Daily   metoprolol tartrate  25 mg Oral BID   Vitamin D (Ergocalciferol)  50,000 Units Oral Weekly   zinc sulfate  220 mg Oral Daily   Continuous Infusions:  remdesivir 100 mg in NS 100 mL 100 mg (06/30/21 0953)     LOS: 2 days    Time spent: 25 minutes    Alberteen Sam, MD [facility] 06/30/2021, 4:48 PM     Please page though AMION  or Epic secure chat:  For [employer], Higher education careers adviser

## 2021-06-30 NOTE — Plan of Care (Signed)
  Problem: Education: Goal: Knowledge of General Education information will improve Description Including pain rating scale, medication(s)/side effects and non-pharmacologic comfort measures Outcome: Progressing   Problem: Health Behavior/Discharge Planning: Goal: Ability to manage health-related needs will improve Outcome: Progressing   

## 2021-06-30 NOTE — Progress Notes (Signed)
Physical Therapy Treatment Patient Details Name: Tanya Cole MRN: [id number] DOB: [birth year] Today's Date: 06/30/2021    History of Present Illness Tanya Cole is an 88yoF who comes to [facility] on 8/28 c CP. CAD s/p PCI to the LAD in 09/2018 in the setting of a NSTEMI with recurrent NSTEMI in 07/2020 medically managed given distal LAD involvement not amenable to PCI, atrial tachycardia, Covid in 12/2020, COPD in the setting of extensive second-hand smoke exposure and worked in a [employer] for 29 years with recurrent admissions for exacerbations, DM, acute blood loss anemia, HTN, HLD, and osteoporosis who was admitted on 8/28 with recurrent Covid. Cardiology consulted 06/27/2021 for chest pain. HS-Tn negative x 4. D-dimer elevated with CTA chest negative for PE. Patient Covid positive. ACS felt to be unlikely at time of consult. Obtain echo to evaluate for significant changes when compared to prior study in 07/2020 and pericardial effusion.    PT Comments    Patient received in bed, sitter present in room. Patient reports she is not feeling good, but not specific. Tearful this session.She is agreeable to PT. Mod independent with bed mobility, transfers with min assist. Ambulated with RW and min assist 40 feet in room. Patient will continue to benefit from skilled PT while here to improve strength and mobility for safe return home. She will need 24 hour assist initially at home, if not available, she will benefit from short rehab stay.      Follow Up Recommendations  Home health PT;Supervision for mobility/OOB;Supervision/Assistance - 24 hour     Equipment Recommendations  Rolling walker with 5" wheels    Recommendations for Other Services       Precautions / Restrictions Precautions Precautions: Fall Restrictions Weight Bearing Restrictions: No    Mobility  Bed Mobility Overal bed mobility: Modified Independent Bed Mobility: Supine to Sit;Sit to Supine     Supine to sit:  Modified independent (Device/Increase time) Sit to supine: Modified independent (Device/Increase time)   General bed mobility comments: supervision for safety but no physical assist provided.    Transfers Overall transfer level: Needs assistance Equipment used: Rolling walker (2 wheeled) Transfers: Sit to/from Stand Sit to Stand: Min assist         General transfer comment: assist to power up to standing, however sitter reports she just woke up  Ambulation/Gait Ambulation/Gait assistance: Min assist Gait Distance (Feet): 40 Feet Assistive device: Rolling walker (2 wheeled) Gait Pattern/deviations: Step-through pattern Gait velocity: decr   General Gait Details: Patient running walker into furniture and objects in room, states she does not use one at home. States she feels like she might fall, but is generally steady with mobility using RW.   Stairs             Wheelchair Mobility    Modified Rankin (Stroke Patients Only)       Balance Overall balance assessment: Needs assistance Sitting-balance support: Feet supported Sitting balance-Leahy Scale: Good     Standing balance support: Bilateral upper extremity supported;During functional activity Standing balance-Leahy Scale: Fair Standing balance comment: UE support for balance                            Cognition Arousal/Alertness: Lethargic Behavior During Therapy: Restless Overall Cognitive Status: No family/caregiver present to determine baseline cognitive functioning  General Comments: Pt is impulsive and trying to get out of bed. Emotional, tearful.      Exercises      General Comments        Pertinent Vitals/Pain Pain Assessment: No/denies pain    Home Living                      Prior Function            PT Goals (current goals can now be found in the care plan section) Acute Rehab PT Goals Patient Stated Goal: to go  home PT Goal Formulation: With patient Time For Goal Achievement: 07/12/21 Progress towards PT goals: Progressing toward goals    Frequency    Min 2X/week      PT Plan Current plan remains appropriate    Co-evaluation              AM-PAC PT "6 Clicks" Mobility   Outcome Measure  Help needed turning from your back to your side while in a flat bed without using bedrails?: None Help needed moving from lying on your back to sitting on the side of a flat bed without using bedrails?: None Help needed moving to and from a bed to a chair (including a wheelchair)?: A Little Help needed standing up from a chair using your arms (e.g., wheelchair or bedside chair)?: A Little Help needed to walk in hospital room?: A Little Help needed climbing 3-5 steps with a railing? : A Lot 6 Click Score: 19    End of Session Equipment Utilized During Treatment: Gait belt Activity Tolerance: Patient tolerated treatment well Patient left: in bed;with call bell/phone within reach;with bed alarm set Nurse Communication: Mobility status PT Visit Diagnosis: Difficulty in walking, not elsewhere classified (R26.2);Other abnormalities of gait and mobility (R26.89)     Time: 4268-3419 PT Time Calculation (min) (ACUTE ONLY): 20 min  Charges:  $Gait Training: 8-22 mins                    [employer], PT, GCS 06/30/21,11:39 AM

## 2021-06-30 NOTE — Telephone Encounter (Signed)
  Care Management   Follow Up Note   06/30/2021 Name: Tanya Cole MRN: [id number] DOB: 07-Jun-1933   Referred by: Dale Halesite, MD Reason for referral : Chronic Care Management (CHF, COPD)   Upon chart review, noted patient hhospitalized at [facility] with Covid.  Follow Up Plan: The care management team will reach out to the patient again over the next 30 days.   Rhae Lerner RN, MSN RN Care Management Coordinator [facility] [phone] [email or web address]

## 2021-07-01 LAB — CBC WITH DIFFERENTIAL/PLATELET
Abs Immature Granulocytes: 0.1 10*3/uL — ABNORMAL HIGH (ref 0.00–0.07)
Basophils Absolute: 0 10*3/uL (ref 0.0–0.1)
Basophils Relative: 0 %
Eosinophils Absolute: 0 10*3/uL (ref 0.0–0.5)
Eosinophils Relative: 0 %
HCT: 34.6 % — ABNORMAL LOW (ref 36.0–46.0)
Hemoglobin: 11.4 g/dL — ABNORMAL LOW (ref 12.0–15.0)
Immature Granulocytes: 1 %
Lymphocytes Relative: 8 %
Lymphs Abs: 1.1 10*3/uL (ref 0.7–4.0)
MCH: 29.6 pg (ref 26.0–34.0)
MCHC: 32.9 g/dL (ref 30.0–36.0)
MCV: 89.9 fL (ref 80.0–100.0)
Monocytes Absolute: 0.4 10*3/uL (ref 0.1–1.0)
Monocytes Relative: 3 %
Neutro Abs: 12.5 10*3/uL — ABNORMAL HIGH (ref 1.7–7.7)
Neutrophils Relative %: 88 %
Platelets: 325 10*3/uL (ref 150–400)
RBC: 3.85 MIL/uL — ABNORMAL LOW (ref 3.87–5.11)
RDW: 14.2 % (ref 11.5–15.5)
WBC: 14.1 10*3/uL — ABNORMAL HIGH (ref 4.0–10.5)
nRBC: 0 % (ref 0.0–0.2)

## 2021-07-01 LAB — D-DIMER, QUANTITATIVE: D-Dimer, Quant: 1.14 ug/mL-FEU — ABNORMAL HIGH (ref 0.00–0.50)

## 2021-07-01 LAB — COMPREHENSIVE METABOLIC PANEL
ALT: 11 U/L (ref 0–44)
AST: 12 U/L — ABNORMAL LOW (ref 15–41)
Albumin: 3 g/dL — ABNORMAL LOW (ref 3.5–5.0)
Alkaline Phosphatase: 81 U/L (ref 38–126)
Anion gap: 6 (ref 5–15)
BUN: 31 mg/dL — ABNORMAL HIGH (ref 8–23)
CO2: 23 mmol/L (ref 22–32)
Calcium: 8.6 mg/dL — ABNORMAL LOW (ref 8.9–10.3)
Chloride: 107 mmol/L (ref 98–111)
Creatinine, Ser: 0.79 mg/dL (ref 0.44–1.00)
GFR, Estimated: >60 mL/min (ref >60)
Glucose, Bld: 250 mg/dL — ABNORMAL HIGH (ref 70–99)
Potassium: 5.5 mmol/L — ABNORMAL HIGH (ref 3.5–5.1)
Sodium: 136 mmol/L (ref 135–145)
Total Bilirubin: 0.6 mg/dL (ref 0.3–1.2)
Total Protein: 6.2 g/dL — ABNORMAL LOW (ref 6.5–8.1)

## 2021-07-01 LAB — C-REACTIVE PROTEIN: CRP: 17.7 mg/dL — ABNORMAL HIGH (ref <1.0)

## 2021-07-01 LAB — GLUCOSE, CAPILLARY
Glucose-Capillary: 264 mg/dL — ABNORMAL HIGH (ref 70–99)
Glucose-Capillary: 166 mg/dL — ABNORMAL HIGH (ref 70–99)
Glucose-Capillary: 359 mg/dL — ABNORMAL HIGH (ref 70–99)

## 2021-07-01 LAB — PROCALCITONIN: Procalcitonin: 0.16 ng/mL

## 2021-07-01 MED ORDER — INSULIN ASPART 100 UNIT/ML IJ SOLN
0.0000 [IU] | Freq: Three times a day (TID) | INTRAMUSCULAR | Status: DC
  Administered 2021-07-01: 8 [IU] via SUBCUTANEOUS
  Administered 2021-07-01: 3 [IU] via SUBCUTANEOUS
  Administered 2021-07-02: 5 [IU] via SUBCUTANEOUS
  Administered 2021-07-02: 3 [IU] via SUBCUTANEOUS
  Filled 2021-07-01 (×4): qty 1

## 2021-07-01 MED ORDER — INSULIN ASPART 100 UNIT/ML IJ SOLN
0.0000 [IU] | Freq: Every day | INTRAMUSCULAR | Status: DC
  Administered 2021-07-01: 5 [IU] via SUBCUTANEOUS
  Filled 2021-07-01: qty 1

## 2021-07-01 MED ORDER — SODIUM CHLORIDE 0.9 % IV SOLN: INTRAVENOUS | Status: DC

## 2021-07-01 NOTE — Progress Notes (Signed)
Spoke to MD-  Per MD, if patient does not need a sitter, ok to discontinue.    After assessing patient and speaking to the sitter.  Patient is alert and oriented enough to try and go without a sitter.    Page MD to let know and D/C sitter order for now.

## 2021-07-01 NOTE — Progress Notes (Signed)
[facility] PROGRESS NOTE    Tanya Cole  JXB:[id number] DOB: Sep 09, 1933 DOA: 06/27/2021 PCP: Dale Timpson, MD      Brief Narrative:  Tanya Cole is a 87 y.o. F with dCHF, CAD, DM, HTN and PMR who presented with cough, pleuritic chest pain.  In the ER-chest x-ray clear, COVID-19 positive.  Initially was requiring 3 L oxygen         Assessment & Plan:  COVID-19 pneumonitis Patient initially admitted with COVID, had very high inflammatory markers, chest x-ray repeated several days ago showed worsening infiltrates on the chest.  Started on steroids. - Continue Solu-Medrol   COVID-19 encephalopathy At baseline, lives alone, is independent.  Initially here was confused and somnolent.  Improved.  Chronic diastolic CHF Coronary artery disease Hypertension Blood pressure normal - Continue aspirin, atorvastatin, lisinopril  Diabetes Well controolled.  Glucose high here on steroids - Start insulin          Disposition: Status is: Inpatient  Remains inpatient appropriate because:IV treatments appropriate due to intensity of illness or inability to take PO  Dispo: The patient is from: Home              Anticipated d/c is to: Home              Patient currently is not medically stable to d/c.   Difficult to place patient No       Level of care: Med-Surg       MDM: The below labs and imaging reports were reviewed and summarized above.  Medication management as above.    DVT prophylaxis: enoxaparin (LOVENOX) injection 40 mg Start: 06/29/21 0800  Code Status: DNR Family Communication: Son and daughter by phone          Subjective: Somewhat improved.  No fever, chest pain, respiratory distress.  She has a bad cough.  She is more mentally alert, her appetite is good.  She has no confusion.  Objective: Vitals:   06/30/21 2124 07/01/21 0408 07/01/21 0751 07/01/21 1100  BP: (!) 126/57 (!) 129/48 (!) 106/50 (!) 119/50   Pulse: 79 65 69 77  Resp: 18 16 17 17   Temp: 98.1 F (36.7 C) 98.3 F (36.8 C) 98.5 F (36.9 C) 98.4 F (36.9 C)  TempSrc:   Oral Oral  SpO2: 95% 92% 93% 95%  Weight:      Height:        Intake/Output Summary (Last 24 hours) at 07/01/2021 1659 Last data filed at 06/30/2021 2100 Gross per 24 hour  Intake 240 ml  Output --  Net 240 ml   Filed Weights   06/26/21 1946  Weight: 53.5 kg    Examination: General appearance: Elderly adult female, lying in bed, no acute distress, more alert and interactive today.     HEENT:    Skin:  Cardiac: RRR, no murmurs, no lower extremity edema Respiratory: Normal respiratory rate and rhythm, lungs clear without rales or wheezes Abdomen: Abdomen soft without tenderness palpation or guarding, no ascites or distention MSK:  Neuro: Awake and alert, extraocular movements intact, face symmetric, speech fluent, moves upper extremities with generalized weakness but symmetric strength Psych: Oriented to person, place, and time, affect normal, judgment insight appear improved     Data Reviewed: I have personally reviewed following labs and imaging studies:  CBC: Recent Labs  Lab 06/26/21 1951 06/28/21 0456 06/29/21 0458 06/30/21 0427 07/01/21 0458  WBC 18.0* 15.0* 17.3* 16.1* 14.1*  NEUTROABS  --  12.1*  12.7* 12.4* 12.5*  HGB 12.9 12.6 13.1 12.0 11.4*  HCT 38.5 37.4 38.9 36.5 34.6*  MCV 89.7 89.7 87.8 90.3 89.9  PLT 258 235 313 315 325   Basic Metabolic Panel: Recent Labs  Lab 06/26/21 1951 06/28/21 0456 06/29/21 0458 06/30/21 0427 07/01/21 0458  NA 136 136 137 136 136  K 3.7 3.5 3.5 4.9 5.5*  CL 102 104 106 109 107  CO2 23 21* 22 21* 23  GLUCOSE 153* 157* 145* 153* 250*  BUN 25* 20 19 33* 31*  CREATININE 1.22* 0.63 0.80 1.01* 0.79  CALCIUM 8.8* 8.7* 8.7* 8.6* 8.6*  MG  --  1.7 1.8 1.7  --    GFR: Estimated Creatinine Clearance: 38.1 mL/min (by C-G formula based on SCr of 0.79 mg/dL). Liver Function Tests: Recent Labs   Lab 06/28/21 0456 06/29/21 0458 06/30/21 0427 07/01/21 0458  AST 13* 13* 12* 12*  ALT 10 10 9 11   ALKPHOS 70 82 73 81  BILITOT 1.5* 1.0 0.8 0.6  PROT 6.6 6.9 6.3* 6.2*  ALBUMIN 3.5 3.6 3.3* 3.0*   No results for input(s): LIPASE, AMYLASE in the last 168 hours. No results for input(s): AMMONIA in the last 168 hours. Coagulation Profile: No results for input(s): INR, PROTIME in the last 168 hours. Cardiac Enzymes: No results for input(s): CKTOTAL, CKMB, CKMBINDEX, TROPONINI in the last 168 hours. BNP (last 3 results) No results for input(s): PROBNP in the last 8760 hours. HbA1C: No results for input(s): HGBA1C in the last 72 hours. CBG: Recent Labs  Lab 06/27/21 2158 06/28/21 2130 07/01/21 1025 07/01/21 1215  GLUCAP 125* 177* 264* 166*   Lipid Profile: No results for input(s): CHOL, HDL, LDLCALC, TRIG, CHOLHDL, LDLDIRECT in the last 72 hours. Thyroid Function Tests: No results for input(s): TSH, T4TOTAL, FREET4, T3FREE, THYROIDAB in the last 72 hours. Anemia Panel: No results for input(s): VITAMINB12, FOLATE, FERRITIN, TIBC, IRON, RETICCTPCT in the last 72 hours.  Urine analysis:    Component Value Date/Time   COLORURINE YELLOW (A) 03/18/2021 1019   APPEARANCEUR CLOUDY (A) 03/18/2021 1019   APPEARANCEUR Clear 02/18/2013 2004   LABSPEC 1.021 03/18/2021 1019   LABSPEC 1.024 02/18/2013 2004   PHURINE 5.0 03/18/2021 1019   GLUCOSEU NEGATIVE 03/18/2021 1019   GLUCOSEU NEGATIVE 01/28/2015 0810   HGBUR NEGATIVE 03/18/2021 1019   BILIRUBINUR NEGATIVE 03/18/2021 1019   BILIRUBINUR neg 01/16/2015 1437   BILIRUBINUR Negative 02/18/2013 2004   KETONESUR NEGATIVE 03/18/2021 1019   PROTEINUR NEGATIVE 03/18/2021 1019   UROBILINOGEN 0.2 01/28/2015 0810   NITRITE NEGATIVE 03/18/2021 1019   LEUKOCYTESUR TRACE (A) 03/18/2021 1019   LEUKOCYTESUR Trace 02/18/2013 2004   Sepsis Labs: @LABRCNTIP (procalcitonin:4,lacticacidven:4)  ) Recent Results (from the past 240 hour(s))   Resp Panel by RT-PCR (Flu A&B, Covid) Nasopharyngeal Swab     Status: Abnormal   Collection Time: 06/27/21  1:56 AM   Specimen: Nasopharyngeal Swab; Nasopharyngeal(NP) swabs in vial transport medium  Result Value Ref Range Status   SARS Coronavirus 2 by RT PCR POSITIVE (A) NEGATIVE Final    Comment: RESULT CALLED TO, READ BACK BY AND VERIFIED WITH: 0319 06/27/21 APRIL BRUMGARD LFD (NOTE) SARS-CoV-2 target nucleic acids are DETECTED.  The SARS-CoV-2 RNA is generally detectable in upper respiratory specimens during the acute phase of infection. Positive results are indicative of the presence of the identified virus, but do not rule out bacterial infection or co-infection with other pathogens not detected by the test. Clinical correlation with patient history and other diagnostic  information is necessary to determine patient infection status. The expected result is Negative.  Fact Sheet for Patients: BloggerCourse.com  Fact Sheet for Healthcare Providers: SeriousBroker.it  This test is not yet approved or cleared by the [place] FDA and  has been authorized for detection and/or diagnosis of SARS-CoV-2 by FDA under an Emergency Use Authorization (EUA).  This EUA will remain in effect (meaning this test can be  used) for the duration of  the COVID-19 declaration under Section 564(b)(1) of the Act, 21 U.S.C. section 360bbb-3(b)(1), unless the authorization is terminated or revoked sooner.     Influenza A by PCR NEGATIVE NEGATIVE Final   Influenza B by PCR NEGATIVE NEGATIVE Final    Comment: (NOTE) The Xpert Xpress SARS-CoV-2/FLU/RSV plus assay is intended as an aid in the diagnosis of influenza from Nasopharyngeal swab specimens and should not be used as a sole basis for treatment. Nasal washings and aspirates are unacceptable for Xpert Xpress SARS-CoV-2/FLU/RSV testing.  Fact Sheet for  Patients: BloggerCourse.com  Fact Sheet for Healthcare Providers: SeriousBroker.it  This test is not yet approved or cleared by the [place] FDA and has been authorized for detection and/or diagnosis of SARS-CoV-2 by FDA under an Emergency Use Authorization (EUA). This EUA will remain in effect (meaning this test can be used) for the duration of the COVID-19 declaration under Section 564(b)(1) of the Act, 21 U.S.C. section 360bbb-3(b)(1), unless the authorization is terminated or revoked.  Performed at [facility], [address], [place], [place] [address]          Radiology Studies: [facility] Chest [place] 1 View  Result Date: 06/29/2021 CLINICAL DATA:  History of sepsis, COVID positive EXAM: PORTABLE CHEST 1 VIEW COMPARISON:  06/26/2021, 03/01/2021, CT chest 06/27/2021 FINDINGS: No consolidation or effusion. Vague interstitial opacities likely due to inflammatory process. Calcified nodule left base. Stable cardiomediastinal silhouette with aortic atherosclerosis. No pneumothorax is seen IMPRESSION: Mild diffuse increased interstitial opacity suggesting interstitial inflammatory process. No focal airspace disease Electronically Signed   By: Jasmine Pang M.D.   On: 06/29/2021 19:57        Scheduled Meds:  alum & mag hydroxide-simeth  30 mL Oral Once   And   lidocaine  15 mL Oral Once   vitamin C  500 mg Oral Daily   aspirin EC  81 mg Oral Daily   atorvastatin  80 mg Oral QPM   cholecalciferol  1,000 Units Oral Daily   enoxaparin (LOVENOX) injection  40 mg Subcutaneous Q24H   fluticasone  2 spray Each Nare Daily   guaiFENesin  600 mg Oral BID   insulin aspart  0-15 Units Subcutaneous TID WC   insulin aspart  0-5 Units Subcutaneous QHS   Ipratropium-Albuterol  1 puff Inhalation QID   methylPREDNISolone (SOLU-MEDROL) injection  40 mg Intravenous Daily   Vitamin D (Ergocalciferol)  50,000 Units Oral Weekly   zinc  sulfate  220 mg Oral Daily   Continuous Infusions:  sodium chloride 100 mL/hr at 07/01/21 1035     LOS: 3 days    Time spent: 25 minutes    Alberteen Sam, MD [facility] 07/01/2021, 4:59 PM     Please page though AMION or Epic secure chat:  For [employer], Higher education careers adviser

## 2021-07-02 ENCOUNTER — Encounter: Payer: Self-pay | Admitting: Internal Medicine

## 2021-07-02 LAB — COMPREHENSIVE METABOLIC PANEL
ALT: 12 U/L (ref 0–44)
AST: 19 U/L (ref 15–41)
Albumin: 3.2 g/dL — ABNORMAL LOW (ref 3.5–5.0)
Alkaline Phosphatase: 87 U/L (ref 38–126)
Anion gap: 5 (ref 5–15)
BUN: 30 mg/dL — ABNORMAL HIGH (ref 8–23)
CO2: 23 mmol/L (ref 22–32)
Calcium: 8.8 mg/dL — ABNORMAL LOW (ref 8.9–10.3)
Chloride: 108 mmol/L (ref 98–111)
Creatinine, Ser: 0.81 mg/dL (ref 0.44–1.00)
GFR, Estimated: >60 mL/min (ref >60)
Glucose, Bld: 198 mg/dL — ABNORMAL HIGH (ref 70–99)
Potassium: 4.8 mmol/L (ref 3.5–5.1)
Sodium: 136 mmol/L (ref 135–145)
Total Bilirubin: 0.5 mg/dL (ref 0.3–1.2)
Total Protein: 6.6 g/dL (ref 6.5–8.1)

## 2021-07-02 LAB — CBC WITH DIFFERENTIAL/PLATELET
Abs Immature Granulocytes: 0.56 10*3/uL — ABNORMAL HIGH (ref 0.00–0.07)
Basophils Absolute: 0.1 10*3/uL (ref 0.0–0.1)
Basophils Relative: 0 %
Eosinophils Absolute: 0.3 10*3/uL (ref 0.0–0.5)
Eosinophils Relative: 1 %
HCT: 35.6 % — ABNORMAL LOW (ref 36.0–46.0)
Hemoglobin: 11.8 g/dL — ABNORMAL LOW (ref 12.0–15.0)
Immature Granulocytes: 3 %
Lymphocytes Relative: 9 %
Lymphs Abs: 1.9 10*3/uL (ref 0.7–4.0)
MCH: 29.3 pg (ref 26.0–34.0)
MCHC: 33.1 g/dL (ref 30.0–36.0)
MCV: 88.3 fL (ref 80.0–100.0)
Monocytes Absolute: 1.2 10*3/uL — ABNORMAL HIGH (ref 0.1–1.0)
Monocytes Relative: 6 %
Neutro Abs: 17.7 10*3/uL — ABNORMAL HIGH (ref 1.7–7.7)
Neutrophils Relative %: 81 %
Platelets: 381 10*3/uL (ref 150–400)
RBC: 4.03 MIL/uL (ref 3.87–5.11)
RDW: 13.8 % (ref 11.5–15.5)
WBC: 21.7 10*3/uL — ABNORMAL HIGH (ref 4.0–10.5)
nRBC: 0 % (ref 0.0–0.2)

## 2021-07-02 LAB — GLUCOSE, CAPILLARY
Glucose-Capillary: 157 mg/dL — ABNORMAL HIGH (ref 70–99)
Glucose-Capillary: 218 mg/dL — ABNORMAL HIGH (ref 70–99)

## 2021-07-02 LAB — D-DIMER, QUANTITATIVE: D-Dimer, Quant: 0.99 ug/mL-FEU — ABNORMAL HIGH (ref 0.00–0.50)

## 2021-07-02 LAB — C-REACTIVE PROTEIN: CRP: 8.5 mg/dL — ABNORMAL HIGH (ref <1.0)

## 2021-07-02 MED ORDER — HYDROCOD POLST-CPM POLST ER 10-8 MG/5ML PO SUER: 5.0000 mL | Freq: Two times a day (BID) | ORAL | 0 refills | Status: DC | PRN

## 2021-07-02 MED ORDER — DEXAMETHASONE 6 MG PO TABS: 6.0000 mg | ORAL_TABLET | Freq: Every day | ORAL | 0 refills | Status: DC

## 2021-07-02 NOTE — Discharge Summary (Signed)
Physician Discharge Summary  Tanya Cole ZJQ:[id number] DOB: [birth year] DOA: 06/27/2021  PCP: [place], MD  Admit date: 06/27/2021 Discharge date: 07/02/2021  Admitted From: Home  Disposition:  Home with [facility]   Recommendations for Outpatient Follow-up:  Follow up with PCP in 2 weeks      Home Health: PT/OT due to ongoing balance issues  Equipment/Devices: None new  Discharge Condition: Fair  CODE STATUS: DNR Diet recommendation: Regular  Brief/Interim Summary: Tanya Cole is a 87 y.o. F with dCHF, CAD, DM, HTN and PMR who presented with cough, pleuritic chest pain.  In the ER-chest x-ray clear, COVID-19 positive.  Initially was requiring 3 L oxygen           PRINCIPAL HOSPITAL DIAGNOSIS: COVID    Discharge Diagnoses:   COVID-19 pneumonitis Patient initially admitted with COVID.  Treated with remdesivir.  Inflammatory markers remained high, was started on steroids and had improvement in breathing and mentation.  Respiratory failure ruled out, at no point with documented hypoxia, despite tachypnea.    Breathing normal at discharge.  Discharged to complete 5 more days dexamethasone.    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  Initially here was confused and somnolent.  Improved back to baseline.  Chronic diastolic CHF Coronary artery disease Hypertension  Diabetes Well controlled.           Discharge Instructions  Discharge Instructions     Discharge instructions   Complete by: As directed    From Dr. Maryfrances Bunnell: You were admitted for coronavirus (Also known as COVID-19)  You were treated with an anti-virus medicine ("remdesivir") and an anti-inflammatory (a "steroid") while you were here.  You completed the course of the anti-virus medicine, remdesivir You should finish the course of steroids by taking dexamethasone once daily for 5 more days  If you have any lingering cough, you should take the cough syrup  we gave you here, Tussionex (with the ingredients "chlorpheniramine" and "hydrocodone" a narcotic cough suppressant)  HOW LONG TO REMAIN IN QUARANTINE: There is no absolutely correct answer to this and so our best answer is to be on the cautious side.  Based on what we know of the virus, you should isolate strictly until 21 days from your first symptoms.  While in quarantine: Don't go out to public spaces If you have anyone in the home who has NOT had coronavirus recently and is at risk (due to old age or pregnancy):    -minimize time in the same room    -if you must be in the same room, make sure you wear a mask and have them wear a mask and safety glasses (if available)    -clean all hard surfaces (counters, doors, tables) after you have been around them     Go see your primary doctor in 10 days for follow up   Increase activity slowly   Complete by: As directed       Allergies as of 07/02/2021       Reactions   Tramadol Itching, Nausea And Vomiting        Medication List     STOP taking these medications    lisinopril 20 MG tablet Commonly known as: ZESTRIL       TAKE these medications    alendronate 70 MG tablet Commonly known as: FOSAMAX Take 70 mg by mouth once a week.   aspirin 81 MG tablet Take 81 mg by mouth daily.   atorvastatin 80 MG tablet Commonly known  as: LIPITOR TAKE 1 TABLET BY MOUTH ONCE DAILY AT  6PM   chlorpheniramine-HYDROcodone 10-8 MG/5ML Suer Commonly known as: TUSSIONEX Take 5 mLs by mouth every 12 (twelve) hours as needed for cough.   dexamethasone 6 MG tablet Commonly known as: DECADRON Take 1 tablet (6 mg total) by mouth daily.   dextromethorphan-guaiFENesin 30-600 MG 12hr tablet Commonly known as: MUCINEX DM Take 1 tablet by mouth 2 (two) times daily as needed for cough.   fluticasone 50 MCG/ACT nasal spray Commonly known as: FLONASE Use 2 spray(s) in each nostril once daily   fluticasone-salmeterol 250-50 MCG/ACT  Aepb Commonly known as: ADVAIR Inhale 1 puff into the lungs in the morning and at bedtime.   ipratropium-albuterol 0.5-2.5 (3) MG/3ML Soln Commonly known as: DUONEB Take 3 mLs by nebulization 2 (two) times daily.   metFORMIN 500 MG tablet Commonly known as: GLUCOPHAGE TAKE 1 TABLET BY MOUTH TWICE DAILY WITH A MEAL   metoprolol tartrate 25 MG tablet Commonly known as: LOPRESSOR Take 1 tablet by mouth twice daily   nitroGLYCERIN 0.4 MG SL tablet Commonly known as: NITROSTAT Place 1 tablet (0.4 mg total) under the tongue every 5 (five) minutes as needed for chest pain.   ondansetron 4 MG disintegrating tablet Commonly known as: Zofran ODT Take 1 tablet (4 mg total) by mouth every 8 (eight) hours as needed for nausea or vomiting.   ProAir HFA 108 (90 Base) MCG/ACT inhaler Generic drug: albuterol Inhale 2 puffs into the lungs every 4 (four) hours as needed for wheezing or shortness of breath.   Vitamin D (Ergocalciferol) 1.25 MG (50000 UNIT) Caps capsule Commonly known as: DRISDOL Take 50,000 Units by mouth once a week.        Follow-up Information     Dale [place], MD. Schedule an appointment as soon as possible for a visit in 2 week(s).   Specialty: Internal Medicine Contact information: [address] [place] [address] [phone]                Allergies  Allergen Reactions   Tramadol Itching and Nausea And Vomiting     Procedures/Studies: DG Chest 2 View  Result Date: 06/26/2021 CLINICAL DATA:  Chest pain. EXAM: CHEST - 2 VIEW COMPARISON:  Chest radiograph dated 05/24/2021 FINDINGS: Bibasilar interstitial coarsening and atelectasis/scarring. No focal consolidation, pleural effusion, pneumothorax the cardiac silhouette is within limits. Atherosclerotic calcification of the aorta. Osteopenia with degenerative changes of the spine. No acute osseous pathology. IMPRESSION: No active cardiopulmonary disease. Electronically Signed    By: Elgie Collard M.D.   On: 06/26/2021 20:14   CT Angio Chest Pulmonary Embolism (PE) W or WO Contrast  Result Date: 06/27/2021 CLINICAL DATA:  Shortness of breath. EXAM: CT ANGIOGRAPHY CHEST WITH CONTRAST TECHNIQUE: Multidetector CT imaging of the chest was performed using the standard protocol during bolus administration of intravenous contrast. Multiplanar CT image reconstructions and MIPs were obtained to evaluate the vascular anatomy. CONTRAST:  75mL OMNIPAQUE IOHEXOL 350 MG/ML SOLN COMPARISON:  08/19/2020 FINDINGS: Cardiovascular: Satisfactory opacification of the pulmonary arteries to the segmental level. No evidence of pulmonary embolism. Normal heart size. No pericardial effusion. Aortic atherosclerosis and coronary artery calcifications. Mediastinum/Nodes: Normal appearance of the thyroid gland. The trachea appears patent and is midline. Small hiatal hernia. No mediastinal, supraclavicular, or axillary adenopathy. Calcified right hilar lymph nodes identified. No pleural effusion identified. Lungs/Pleura: There is new right lower lobe atelectasis with thickening of the peribronchovascular interstitium and peripheral subpleural areas of nodular consolidation,  likely secondary to infection/inflammation. Areas of subsegmental atelectasis noted within the lingula and posterior left base. Calcified granuloma identified within the left lower lobe. Upper Abdomen: Status post cholecystectomy. Tiny area of pneumobilia noted within the left hepatic lobe. Aortic atherosclerosis. Calcified granuloma in the spleen. Musculoskeletal: Spondylosis identified within the thoracic spine. No acute findings. Review of the MIP images confirms the above findings. IMPRESSION: 1. No evidence for acute pulmonary embolus. 2. Right lower lobe atelectasis with thickening of the peribronchovascular interstitium and peripheral subpleural areas of nodular consolidation, likely secondary to infection/inflammation. Aspiration not  excluded. 3. Aortic atherosclerosis and coronary artery calcifications. Aortic Atherosclerosis (ICD10-I70.0). Electronically Signed   By: Signa Kell M.D.   On: 06/27/2021 06:33   DG Chest Port 1 View  Result Date: 06/29/2021 CLINICAL DATA:  History of sepsis, COVID positive EXAM: PORTABLE CHEST 1 VIEW COMPARISON:  06/26/2021, 03/01/2021, CT chest 06/27/2021 FINDINGS: No consolidation or effusion. Vague interstitial opacities likely due to inflammatory process. Calcified nodule left base. Stable cardiomediastinal silhouette with aortic atherosclerosis. No pneumothorax is seen IMPRESSION: Mild diffuse increased interstitial opacity suggesting interstitial inflammatory process. No focal airspace disease Electronically Signed   By: Jasmine Pang M.D.   On: 06/29/2021 19:57   ECHOCARDIOGRAM COMPLETE  Result Date: 06/28/2021    ECHOCARDIOGRAM REPORT   Patient Name:   Tanya Cole [facility] Date of Exam: 06/28/2021 Medical Rec #:  [id number]          Height:       61.0 in Accession #:    [id number]         Weight:       118.0 lb Date of Birth:  09-23-1933          BSA:          1.509 m Patient Age:    88 years           BP:           130/57 mmHg Patient Gender: F                  HR:           92 bpm. Exam Location:  [facility] Procedure: 2D Echo, Color Doppler and Cardiac Doppler Indications:     R07.9 Chest Pain  History:         Patient has prior history of Echocardiogram examinations, most                  recent 08/08/2020. HFpEF and CHF, CAD; Risk Factors:Diabetes,                  Hypertension and HCL. Pt tested positive for COVID-19 on                  06/27/21.  Sonographer:     Humphrey Rolls Referring Phys:  [id number] Raymon Mutton DUNN Diagnosing Phys: Lorine Bears MD IMPRESSIONS  1. Left ventricular ejection fraction, by estimation, is 55 to 60%. The left ventricle has normal function. The left ventricle has no regional wall motion abnormalities. Left ventricular diastolic parameters are indeterminate.  2. Right ventricular  systolic function is normal. The right ventricular size is normal. There is mildly elevated pulmonary artery systolic pressure.  3. The mitral valve is normal in structure. Mild to moderate mitral valve regurgitation. No evidence of mitral stenosis.  4. The aortic valve is normal in structure. Aortic valve regurgitation is not visualized. Mild aortic valve sclerosis is present, with no evidence of aortic  valve stenosis.  5. The inferior vena cava is normal in size with greater than 50% respiratory variability, suggesting right atrial pressure of 3 mmHg. FINDINGS  Left Ventricle: Left ventricular ejection fraction, by estimation, is 55 to 60%. The left ventricle has normal function. The left ventricle has no regional wall motion abnormalities. The left ventricular internal cavity size was normal in size. There is  no left ventricular hypertrophy. Left ventricular diastolic parameters are indeterminate. Right Ventricle: The right ventricular size is normal. No increase in right ventricular wall thickness. Right ventricular systolic function is normal. There is mildly elevated pulmonary artery systolic pressure. The tricuspid regurgitant velocity is 2.87  m/s, and with an assumed right atrial pressure of 3 mmHg, the estimated right ventricular systolic pressure is 35.9 mmHg. Left Atrium: Left atrial size was normal in size. Right Atrium: Right atrial size was normal in size. Pericardium: There is no evidence of pericardial effusion. Mitral Valve: The mitral valve is normal in structure. Mild mitral annular calcification. Mild to moderate mitral valve regurgitation, with posteriorly-directed jet. No evidence of mitral valve stenosis. MV peak gradient, 4.8 mmHg. The mean mitral valve gradient is 2.0 mmHg. Tricuspid Valve: The tricuspid valve is normal in structure. Tricuspid valve regurgitation is not demonstrated. No evidence of tricuspid stenosis. Aortic Valve: The aortic valve is normal in structure. Aortic valve  regurgitation is not visualized. Mild aortic valve sclerosis is present, with no evidence of aortic valve stenosis. Aortic valve mean gradient measures 7.0 mmHg. Aortic valve peak gradient measures 11.0 mmHg. Aortic valve area, by VTI measures 2.58 cm. Pulmonic Valve: The pulmonic valve was normal in structure. Pulmonic valve regurgitation is mild. No evidence of pulmonic stenosis. Aorta: The aortic root is normal in size and structure. Venous: The inferior vena cava was not well visualized. The inferior vena cava is normal in size with greater than 50% respiratory variability, suggesting right atrial pressure of 3 mmHg. IAS/Shunts: No atrial level shunt detected by color flow Doppler.  LEFT VENTRICLE PLAX 2D LVIDd:         3.90 cm  Diastology LVIDs:         2.40 cm  LV e' medial:    5.44 cm/s LV PW:         0.90 cm  LV E/e' medial:  19.5 LV IVS:        1.00 cm  LV e' lateral:   9.46 cm/s LVOT diam:     2.30 cm  LV E/e' lateral: 11.2 LV SV:         70 LV SV Index:   47 LVOT Area:     4.15 cm  RIGHT VENTRICLE RV Basal diam:  2.90 cm LEFT ATRIUM             Index       RIGHT ATRIUM           Index LA diam:        3.80 cm 2.52 cm/m  RA Area:     11.40 cm LA Vol (A2C):   36.4 ml 24.12 ml/m RA Volume:   22.70 ml  15.04 ml/m LA Vol (A4C):   50.0 ml 33.13 ml/m LA Biplane Vol: 44.4 ml 29.42 ml/m  AORTIC VALVE                    PULMONIC VALVE AV Area (Vmax):    2.30 cm     PV Vmax:       0.89 m/s AV Area (  Vmean):   2.23 cm     PV Vmean:      60.700 cm/s AV Area (VTI):     2.58 cm     PV VTI:        0.164 m AV Vmax:           166.00 cm/s  PV Peak grad:  3.2 mmHg AV Vmean:          122.000 cm/s PV Mean grad:  2.0 mmHg AV VTI:            0.272 m AV Peak Grad:      11.0 mmHg AV Mean Grad:      7.0 mmHg LVOT Vmax:         92.00 cm/s LVOT Vmean:        65.600 cm/s LVOT VTI:          0.169 m LVOT/AV VTI ratio: 0.62  AORTA Ao Root diam: 2.70 cm MITRAL VALVE                TRICUSPID VALVE MV Area (PHT): 3.89 cm     TR  Peak grad:   32.9 mmHg MV Area VTI:   2.53 cm     TR Vmax:        287.00 cm/s MV Peak grad:  4.8 mmHg MV Mean grad:  2.0 mmHg     SHUNTS MV Vmax:       1.10 m/s     Systemic VTI:  0.17 m MV Vmean:      67.2 cm/s    Systemic Diam: 2.30 cm MV Decel Time: 195 msec MV E velocity: 106.00 cm/s MV A velocity: 102.00 cm/s MV E/A ratio:  1.04 Lorine Bears MD Electronically signed by Lorine Bears MD Signature Date/Time: 06/28/2021/1:14:56 PM    Final       Subjective: Feeling well.  Tired.  No confusion, no fever, no dyspnea.  Cough improving.    Discharge Exam: Vitals:   07/02/21 0340 07/02/21 0840  BP: (!) 146/69 (!) 153/70  Pulse: 74   Resp: 18 18  Temp: 99.6 F (37.6 C) 97.8 F (36.6 C)  SpO2: 95% 97%   Vitals:   07/01/21 1957 07/02/21 0028 07/02/21 0340 07/02/21 0840  BP: (!) 143/63 140/62 (!) 146/69 (!) 153/70  Pulse: 75 78 74   Resp: 18 18 18 18   Temp: 99.9 F (37.7 C) 98.6 F (37 C) 99.6 F (37.6 C) 97.8 F (36.6 C)  TempSrc: Oral Oral Oral Oral  SpO2: 96% 96% 95% 97%  Weight:      Height:        General: Pt is alert, awake, not in acute distress Cardiovascular: RRR, nl S1-S2, no murmurs appreciated.   No LE edema.   Respiratory: Normal respiratory rate and rhythm.  CTAB without rales or wheezes. Abdominal: Abdomen soft and non-tender.  No distension or HSM.   Neuro/Psych: Strength symmetric in upper and lower extremities.  Judgment and insight appear normal.   The results of significant diagnostics from this hospitalization (including imaging, microbiology, ancillary and laboratory) are listed below for reference.     Microbiology: Recent Results (from the past 240 hour(s))  Resp Panel by RT-PCR (Flu A&B, Covid) Nasopharyngeal Swab     Status: Abnormal   Collection Time: 06/27/21  1:56 AM   Specimen: Nasopharyngeal Swab; Nasopharyngeal(NP) swabs in vial transport medium  Result Value Ref Range Status   SARS Coronavirus 2 by RT PCR POSITIVE (A) NEGATIVE Final  Comment: RESULT CALLED TO, READ BACK BY AND VERIFIED WITH: 0319 06/27/21 APRIL BRUMGARD LFD (NOTE) SARS-CoV-2 target nucleic acids are DETECTED.  The SARS-CoV-2 RNA is generally detectable in upper respiratory specimens during the acute phase of infection. Positive results are indicative of the presence of the identified virus, but do not rule out bacterial infection or co-infection with other pathogens not detected by the test. Clinical correlation with patient history and other diagnostic information is necessary to determine patient infection status. The expected result is Negative.  Fact Sheet for Patients: BloggerCourse.com  Fact Sheet for Healthcare Providers: SeriousBroker.it  This test is not yet approved or cleared by the [place] FDA and  has been authorized for detection and/or diagnosis of SARS-CoV-2 by FDA under an Emergency Use Authorization (EUA).  This EUA will remain in effect (meaning this test can be  used) for the duration of  the COVID-19 declaration under Section 564(b)(1) of the Act, 21 U.S.C. section 360bbb-3(b)(1), unless the authorization is terminated or revoked sooner.     Influenza A by PCR NEGATIVE NEGATIVE Final   Influenza B by PCR NEGATIVE NEGATIVE Final    Comment: (NOTE) The Xpert Xpress SARS-CoV-2/FLU/RSV plus assay is intended as an aid in the diagnosis of influenza from Nasopharyngeal swab specimens and should not be used as a sole basis for treatment. Nasal washings and aspirates are unacceptable for Xpert Xpress SARS-CoV-2/FLU/RSV testing.  Fact Sheet for Patients: BloggerCourse.com  Fact Sheet for Healthcare Providers: SeriousBroker.it  This test is not yet approved or cleared by the [place] FDA and has been authorized for detection and/or diagnosis of SARS-CoV-2 by FDA under an Emergency Use Authorization (EUA). This EUA  will remain in effect (meaning this test can be used) for the duration of the COVID-19 declaration under Section 564(b)(1) of the Act, 21 U.S.C. section 360bbb-3(b)(1), unless the authorization is terminated or revoked.  Performed at [facility], [address] [place], [place] [address]      Labs: BNP (last 3 results) Recent Labs    05/14/21 0737 05/24/21 1256 06/27/21 0531  BNP 106.2* 73.7 42.3   Basic Metabolic Panel: Recent Labs  Lab 06/28/21 0456 06/29/21 0458 06/30/21 0427 07/01/21 0458 07/02/21 0511  NA 136 137 136 136 136  K 3.5 3.5 4.9 5.5* 4.8  CL 104 106 109 107 108  CO2 21* 22 21* 23 23  GLUCOSE 157* 145* 153* 250* 198*  BUN 20 19 33* 31* 30*  CREATININE 0.63 0.80 1.01* 0.79 0.81  CALCIUM 8.7* 8.7* 8.6* 8.6* 8.8*  MG 1.7 1.8 1.7  --   --    Liver Function Tests: Recent Labs  Lab 06/28/21 0456 06/29/21 0458 06/30/21 0427 07/01/21 0458 07/02/21 0511  AST 13* 13* 12* 12* 19  ALT ALKPHOS 70 82 73 81 87  BILITOT 1.5* 1.0 0.8 0.6 0.5  PROT 6.6 6.9 6.3* 6.2* 6.6  ALBUMIN 3.5 3.6 3.3* 3.0* 3.2*   No results for input(s): LIPASE, AMYLASE in the last 168 hours. No results for input(s): AMMONIA in the last 168 hours. CBC: Recent Labs  Lab 06/28/21 0456 06/29/21 0458 06/30/21 0427 07/01/21 0458 07/02/21 0511  WBC 15.0* 17.3* 16.1* 14.1* 21.7*  NEUTROABS 12.1* 12.7* 12.4* 12.5* 17.7*  HGB 12.6 13.1 12.0 11.4* 11.8*  HCT 37.4 38.9 36.5 34.6* 35.6*  MCV 89.7 87.8 90.3 89.9 88.3  PLT 235 313 315 325 381   Cardiac Enzymes: No results for input(s): CKTOTAL, CKMB, CKMBINDEX,  TROPONINI in the last 168 hours. BNP: Invalid input(s): POCBNP CBG: Recent Labs  Lab 07/01/21 1025 07/01/21 1215 07/01/21 1953 07/02/21 0839 07/02/21 1127  GLUCAP 264* 166* 359* 157* 218*   D-Dimer Recent Labs    07/01/21 0458 07/02/21 0511  DDIMER 1.14* 0.99*   Hgb A1c No results for input(s): HGBA1C in the last 72 hours. Lipid  Profile No results for input(s): CHOL, HDL, LDLCALC, TRIG, CHOLHDL, LDLDIRECT in the last 72 hours. Thyroid function studies No results for input(s): TSH, T4TOTAL, T3FREE, THYROIDAB in the last 72 hours.  Invalid input(s): FREET3 Anemia work up No results for input(s): VITAMINB12, FOLATE, FERRITIN, TIBC, IRON, RETICCTPCT in the last 72 hours. Urinalysis    Component Value Date/Time   COLORURINE YELLOW (A) 03/18/2021 1019   APPEARANCEUR CLOUDY (A) 03/18/2021 1019   APPEARANCEUR Clear 02/18/2013 2004   LABSPEC 1.021 03/18/2021 1019   LABSPEC 1.024 02/18/2013 2004   PHURINE 5.0 03/18/2021 1019   GLUCOSEU NEGATIVE 03/18/2021 1019   GLUCOSEU NEGATIVE 01/28/2015 0810   HGBUR NEGATIVE 03/18/2021 1019   BILIRUBINUR NEGATIVE 03/18/2021 1019   BILIRUBINUR neg 01/16/2015 1437   BILIRUBINUR Negative 02/18/2013 2004   KETONESUR NEGATIVE 03/18/2021 1019   PROTEINUR NEGATIVE 03/18/2021 1019   UROBILINOGEN 0.2 01/28/2015 0810   NITRITE NEGATIVE 03/18/2021 1019   LEUKOCYTESUR TRACE (A) 03/18/2021 1019   LEUKOCYTESUR Trace 02/18/2013 2004   Sepsis Labs Invalid input(s): PROCALCITONIN,  WBC,  LACTICIDVEN Microbiology Recent Results (from the past 240 hour(s))  Resp Panel by RT-PCR (Flu A&B, Covid) Nasopharyngeal Swab     Status: Abnormal   Collection Time: 06/27/21  1:56 AM   Specimen: Nasopharyngeal Swab; Nasopharyngeal(NP) swabs in vial transport medium  Result Value Ref Range Status   SARS Coronavirus 2 by RT PCR POSITIVE (A) NEGATIVE Final    Comment: RESULT CALLED TO, READ BACK BY AND VERIFIED WITH: 0319 06/27/21 APRIL BRUMGARD LFD (NOTE) SARS-CoV-2 target nucleic acids are DETECTED.  The SARS-CoV-2 RNA is generally detectable in upper respiratory specimens during the acute phase of infection. Positive results are indicative of the presence of the identified virus, but do not rule out bacterial infection or co-infection with other pathogens not detected by the test. Clinical  correlation with patient history and other diagnostic information is necessary to determine patient infection status. The expected result is Negative.  Fact Sheet for Patients: BloggerCourse.com  Fact Sheet for Healthcare Providers: SeriousBroker.it  This test is not yet approved or cleared by the [place] FDA and  has been authorized for detection and/or diagnosis of SARS-CoV-2 by FDA under an Emergency Use Authorization (EUA).  This EUA will remain in effect (meaning this test can be  used) for the duration of  the COVID-19 declaration under Section 564(b)(1) of the Act, 21 U.S.C. section 360bbb-3(b)(1), unless the authorization is terminated or revoked sooner.     Influenza A by PCR NEGATIVE NEGATIVE Final   Influenza B by PCR NEGATIVE NEGATIVE Final    Comment: (NOTE) The Xpert Xpress SARS-CoV-2/FLU/RSV plus assay is intended as an aid in the diagnosis of influenza from Nasopharyngeal swab specimens and should not be used as a sole basis for treatment. Nasal washings and aspirates are unacceptable for Xpert Xpress SARS-CoV-2/FLU/RSV testing.  Fact Sheet for Patients: BloggerCourse.com  Fact Sheet for Healthcare Providers: SeriousBroker.it  This test is not yet approved or cleared by the [place] FDA and has been authorized for detection and/or diagnosis of SARS-CoV-2 by FDA under an Emergency Use Authorization (EUA). This EUA  will remain in effect (meaning this test can be used) for the duration of the COVID-19 declaration under Section 564(b)(1) of the Act, 21 U.S.C. section 360bbb-3(b)(1), unless the authorization is terminated or revoked.  Performed at [facility], [address] [place], [place] [address]      Time coordinating discharge: 25 minutes   30 Day Unplanned Readmission Risk Score    Flowsheet Row ED to Hosp-Admission  (Discharged) from 06/27/2021 in [facility] PCU  30 Day Unplanned Readmission Risk Score (%) 47.93 Filed at 07/02/2021 1200       This score is the patient's risk of an unplanned readmission within 30 days of being discharged (0 -100%). The score is based on dignosis, age, lab data, medications, orders, and past utilization.   Low:  0-14.9   Medium: 15-21.9   High: 22-29.9   Extreme: 30 and above            SIGNED:   Alberteen Sam, MD  [facility] 07/02/2021, 4:00 PM

## 2021-07-04 ENCOUNTER — Other Ambulatory Visit: Payer: Self-pay | Admitting: Internal Medicine

## 2021-07-09 ENCOUNTER — Ambulatory Visit: Payer: Medicare HMO | Admitting: *Deleted

## 2021-07-09 DIAGNOSIS — Z7984 Long term (current) use of oral hypoglycemic drugs: Secondary | ICD-10-CM | POA: Diagnosis not present

## 2021-07-09 DIAGNOSIS — Z7952 Long term (current) use of systemic steroids: Secondary | ICD-10-CM | POA: Diagnosis not present

## 2021-07-09 DIAGNOSIS — J9621 Acute and chronic respiratory failure with hypoxia: Secondary | ICD-10-CM | POA: Diagnosis not present

## 2021-07-09 DIAGNOSIS — Z7951 Long term (current) use of inhaled steroids: Secondary | ICD-10-CM | POA: Diagnosis not present

## 2021-07-09 DIAGNOSIS — J449 Chronic obstructive pulmonary disease, unspecified: Secondary | ICD-10-CM

## 2021-07-09 DIAGNOSIS — E1165 Type 2 diabetes mellitus with hyperglycemia: Secondary | ICD-10-CM | POA: Diagnosis not present

## 2021-07-09 DIAGNOSIS — E7800 Pure hypercholesterolemia, unspecified: Secondary | ICD-10-CM | POA: Diagnosis not present

## 2021-07-09 DIAGNOSIS — G9340 Encephalopathy, unspecified: Secondary | ICD-10-CM | POA: Diagnosis not present

## 2021-07-09 DIAGNOSIS — M25562 Pain in left knee: Secondary | ICD-10-CM | POA: Diagnosis not present

## 2021-07-09 DIAGNOSIS — R7402 Elevation of levels of lactic acid dehydrogenase (LDH): Secondary | ICD-10-CM | POA: Diagnosis not present

## 2021-07-09 DIAGNOSIS — M25561 Pain in right knee: Secondary | ICD-10-CM | POA: Diagnosis not present

## 2021-07-09 DIAGNOSIS — E876 Hypokalemia: Secondary | ICD-10-CM | POA: Diagnosis not present

## 2021-07-09 DIAGNOSIS — Z602 Problems related to living alone: Secondary | ICD-10-CM | POA: Diagnosis not present

## 2021-07-09 DIAGNOSIS — U071 COVID-19: Secondary | ICD-10-CM | POA: Diagnosis not present

## 2021-07-09 DIAGNOSIS — Z7982 Long term (current) use of aspirin: Secondary | ICD-10-CM | POA: Diagnosis not present

## 2021-07-09 DIAGNOSIS — J441 Chronic obstructive pulmonary disease with (acute) exacerbation: Secondary | ICD-10-CM | POA: Diagnosis not present

## 2021-07-09 DIAGNOSIS — I110 Hypertensive heart disease with heart failure: Secondary | ICD-10-CM | POA: Diagnosis not present

## 2021-07-09 DIAGNOSIS — E1159 Type 2 diabetes mellitus with other circulatory complications: Secondary | ICD-10-CM | POA: Diagnosis not present

## 2021-07-09 DIAGNOSIS — I5032 Chronic diastolic (congestive) heart failure: Secondary | ICD-10-CM

## 2021-07-09 DIAGNOSIS — J45901 Unspecified asthma with (acute) exacerbation: Secondary | ICD-10-CM | POA: Diagnosis not present

## 2021-07-09 DIAGNOSIS — I2510 Atherosclerotic heart disease of native coronary artery without angina pectoris: Secondary | ICD-10-CM | POA: Diagnosis not present

## 2021-07-09 NOTE — Chronic Care Management (AMB) (Signed)
  Care Management   Follow Up Note   07/09/2021 Name: Tanya Cole MRN: [id number] DOB: 04-04-1933   Referred by: Dale Templeton, MD Reason for referral : Chronic Care Management (Post hosp)   Successful telephone outreach to patient for follow up telephone assessment.  Patient answered and stated she waiting on someone to come and not feeling well at this time.  Requested call back another day and time.  Follow Up Plan: The care management team will reach out to the patient again over the next 14 days.   Rhae Lerner RN, MSN RN Care Management Coordinator [facility] [phone] [email or web address]

## 2021-07-13 ENCOUNTER — Telehealth: Payer: Self-pay | Admitting: Internal Medicine

## 2021-07-13 DIAGNOSIS — Z602 Problems related to living alone: Secondary | ICD-10-CM | POA: Diagnosis not present

## 2021-07-13 DIAGNOSIS — J9621 Acute and chronic respiratory failure with hypoxia: Secondary | ICD-10-CM | POA: Diagnosis not present

## 2021-07-13 DIAGNOSIS — Z7952 Long term (current) use of systemic steroids: Secondary | ICD-10-CM | POA: Diagnosis not present

## 2021-07-13 DIAGNOSIS — Z7982 Long term (current) use of aspirin: Secondary | ICD-10-CM | POA: Diagnosis not present

## 2021-07-13 DIAGNOSIS — E1159 Type 2 diabetes mellitus with other circulatory complications: Secondary | ICD-10-CM | POA: Diagnosis not present

## 2021-07-13 DIAGNOSIS — Z7984 Long term (current) use of oral hypoglycemic drugs: Secondary | ICD-10-CM | POA: Diagnosis not present

## 2021-07-13 DIAGNOSIS — J45901 Unspecified asthma with (acute) exacerbation: Secondary | ICD-10-CM | POA: Diagnosis not present

## 2021-07-13 DIAGNOSIS — E1165 Type 2 diabetes mellitus with hyperglycemia: Secondary | ICD-10-CM | POA: Diagnosis not present

## 2021-07-13 DIAGNOSIS — R7402 Elevation of levels of lactic acid dehydrogenase (LDH): Secondary | ICD-10-CM | POA: Diagnosis not present

## 2021-07-13 DIAGNOSIS — E7800 Pure hypercholesterolemia, unspecified: Secondary | ICD-10-CM | POA: Diagnosis not present

## 2021-07-13 DIAGNOSIS — Z7951 Long term (current) use of inhaled steroids: Secondary | ICD-10-CM | POA: Diagnosis not present

## 2021-07-13 DIAGNOSIS — I110 Hypertensive heart disease with heart failure: Secondary | ICD-10-CM | POA: Diagnosis not present

## 2021-07-13 DIAGNOSIS — M25562 Pain in left knee: Secondary | ICD-10-CM | POA: Diagnosis not present

## 2021-07-13 DIAGNOSIS — I2510 Atherosclerotic heart disease of native coronary artery without angina pectoris: Secondary | ICD-10-CM | POA: Diagnosis not present

## 2021-07-13 DIAGNOSIS — I5032 Chronic diastolic (congestive) heart failure: Secondary | ICD-10-CM | POA: Diagnosis not present

## 2021-07-13 DIAGNOSIS — G9340 Encephalopathy, unspecified: Secondary | ICD-10-CM | POA: Diagnosis not present

## 2021-07-13 DIAGNOSIS — J441 Chronic obstructive pulmonary disease with (acute) exacerbation: Secondary | ICD-10-CM | POA: Diagnosis not present

## 2021-07-13 DIAGNOSIS — M25561 Pain in right knee: Secondary | ICD-10-CM | POA: Diagnosis not present

## 2021-07-13 DIAGNOSIS — U071 COVID-19: Secondary | ICD-10-CM | POA: Diagnosis not present

## 2021-07-13 DIAGNOSIS — E876 Hypokalemia: Secondary | ICD-10-CM | POA: Diagnosis not present

## 2021-07-13 NOTE — Telephone Encounter (Signed)
Noted  

## 2021-07-13 NOTE — Telephone Encounter (Signed)
Calling to get orders for frequency. She will be faxing these over from [facility] with [facility].

## 2021-07-14 NOTE — Telephone Encounter (Signed)
LM for Tanya Cole, need order numbers so I can determine which orders they are looking for. Do not have any waiting to be faxed.

## 2021-07-14 NOTE — Telephone Encounter (Signed)
Stephanie from [facility] is returning your call about the message below.Please call her at [phone].

## 2021-07-14 NOTE — Telephone Encounter (Signed)
Tanya Cole with home health calling in about 4 orders for this Patient. Oldest ordered dating back to July 25th.   All 4 orders were faxed to [phone] over the course of august and she states they have not gotten any of these faxed back.   Please advise if you have these orders

## 2021-07-15 DIAGNOSIS — M25562 Pain in left knee: Secondary | ICD-10-CM | POA: Diagnosis not present

## 2021-07-15 DIAGNOSIS — E1159 Type 2 diabetes mellitus with other circulatory complications: Secondary | ICD-10-CM | POA: Diagnosis not present

## 2021-07-15 DIAGNOSIS — E7800 Pure hypercholesterolemia, unspecified: Secondary | ICD-10-CM | POA: Diagnosis not present

## 2021-07-15 DIAGNOSIS — I110 Hypertensive heart disease with heart failure: Secondary | ICD-10-CM | POA: Diagnosis not present

## 2021-07-15 DIAGNOSIS — J441 Chronic obstructive pulmonary disease with (acute) exacerbation: Secondary | ICD-10-CM | POA: Diagnosis not present

## 2021-07-15 DIAGNOSIS — E876 Hypokalemia: Secondary | ICD-10-CM | POA: Diagnosis not present

## 2021-07-15 DIAGNOSIS — G9340 Encephalopathy, unspecified: Secondary | ICD-10-CM | POA: Diagnosis not present

## 2021-07-15 DIAGNOSIS — I5032 Chronic diastolic (congestive) heart failure: Secondary | ICD-10-CM | POA: Diagnosis not present

## 2021-07-15 DIAGNOSIS — J9621 Acute and chronic respiratory failure with hypoxia: Secondary | ICD-10-CM | POA: Diagnosis not present

## 2021-07-15 DIAGNOSIS — R7402 Elevation of levels of lactic acid dehydrogenase (LDH): Secondary | ICD-10-CM | POA: Diagnosis not present

## 2021-07-15 DIAGNOSIS — Z7982 Long term (current) use of aspirin: Secondary | ICD-10-CM | POA: Diagnosis not present

## 2021-07-15 DIAGNOSIS — I2510 Atherosclerotic heart disease of native coronary artery without angina pectoris: Secondary | ICD-10-CM | POA: Diagnosis not present

## 2021-07-15 DIAGNOSIS — U071 COVID-19: Secondary | ICD-10-CM | POA: Diagnosis not present

## 2021-07-15 DIAGNOSIS — Z7951 Long term (current) use of inhaled steroids: Secondary | ICD-10-CM | POA: Diagnosis not present

## 2021-07-15 DIAGNOSIS — M25561 Pain in right knee: Secondary | ICD-10-CM | POA: Diagnosis not present

## 2021-07-15 DIAGNOSIS — J45901 Unspecified asthma with (acute) exacerbation: Secondary | ICD-10-CM | POA: Diagnosis not present

## 2021-07-15 DIAGNOSIS — Z602 Problems related to living alone: Secondary | ICD-10-CM | POA: Diagnosis not present

## 2021-07-15 DIAGNOSIS — E1165 Type 2 diabetes mellitus with hyperglycemia: Secondary | ICD-10-CM | POA: Diagnosis not present

## 2021-07-15 DIAGNOSIS — Z7984 Long term (current) use of oral hypoglycemic drugs: Secondary | ICD-10-CM | POA: Diagnosis not present

## 2021-07-15 DIAGNOSIS — Z7952 Long term (current) use of systemic steroids: Secondary | ICD-10-CM | POA: Diagnosis not present

## 2021-07-15 NOTE — Telephone Encounter (Signed)
Resent the one order that we did have in chart. Judeth Cornfield is resending the other 3. Order numbers are [id number], [id number], [id number]

## 2021-07-16 ENCOUNTER — Ambulatory Visit (INDEPENDENT_AMBULATORY_CARE_PROVIDER_SITE_OTHER): Payer: Medicare Other | Admitting: *Deleted

## 2021-07-16 DIAGNOSIS — U071 COVID-19: Secondary | ICD-10-CM

## 2021-07-16 DIAGNOSIS — E119 Type 2 diabetes mellitus without complications: Secondary | ICD-10-CM

## 2021-07-16 DIAGNOSIS — J441 Chronic obstructive pulmonary disease with (acute) exacerbation: Secondary | ICD-10-CM

## 2021-07-16 DIAGNOSIS — I5032 Chronic diastolic (congestive) heart failure: Secondary | ICD-10-CM

## 2021-07-16 NOTE — Patient Instructions (Signed)
Visit Information  PATIENT GOALS:  Goals Addressed             This Visit's Progress    (RNCM)  Track and Manage My Symptoms-COPD   Not on track    Timeframe:  Long-Range Goal Priority:  High Start Date:   06/04/21                          Expected End Date:   12/28/21                   Barriers: Knowledge  Follow Up Date 08/04/21    Develop a rescue plan & follow rescue plan if symptoms flare-up Eliminate symptom triggers at home Consider using nebulizer for increase in shortness of breath Contact provider for increase shortness of breath and increased need for rescue inhaler  Keep and attend follow-up appointments Dr. Meredeth Ide with Pulmonology should be contacting for follow up appointment   Why is this important?   Tracking your symptoms and other information about your health helps your doctor plan your care.  Write down the symptoms, the time of day, what you were doing and what medicine you are taking.  You will soon learn how to manage your symptoms.     Notes:      (RNCM)  Track and Manage Symptoms-Heart Failure/DM   Not on track    Timeframe:  Long-Range Goal Priority:  Medium Start Date:    06/04/21                         Expected End Date:   12/28/21                   Barriers: Knowledge  Follow Up Date 08/04/21    Weigh self daily and keep in log for provider review Notify if you gain 2-3 pounds overnight of 5 pounds in 5 days Develop a rescue plan & follow rescue plan if symptoms flare-up Know when to call the doctor Low salt and low carbohydrate diabetic diet Check blood sugars at least daily, keeping log for provider review     Why is this important?   You will be able to handle your symptoms better if you keep track of them.  Making some simple changes to your lifestyle will help.  Eating healthy is one thing you can do to take good care of yourself.    Notes:         The patient verbalized understanding of instructions, educational materials,  and care plan provided today and declined offer to receive copy of patient instructions, educational materials, and care plan.   The care management team will reach out to the patient again over the next 14 business days.   Rhae Lerner RN, MSN RN Care Management Coordinator [facility] [phone] [email or web address]

## 2021-07-16 NOTE — Chronic Care Management (AMB) (Signed)
Chronic Care Management   CCM RN Visit Note  07/16/2021 Name: Tanya Cole MRN: [id number] DOB: Oct 02, 1933  Subjective: Tanya Cole is a 87 y.o. year old female who is a primary care patient of Dale Gordon, MD. The care management team was consulted for assistance with disease management and care coordination needs.    Engaged with patient by telephone for follow up visit in response to provider referral for case management and/or care coordination services.   Consent to Services:  The patient was given information about Chronic Care Management services, agreed to services, and gave verbal consent prior to initiation of services.  Please see initial visit note for detailed documentation.   Patient agreed to services and verbal consent obtained.   Assessment: Review of patient past medical history, allergies, medications, health status, including review of consultants reports, laboratory and other test data, was performed as part of comprehensive evaluation and provision of chronic care management services.   SDOH (Social Determinants of Health) assessments and interventions performed:    CCM Care Plan  Allergies  Allergen Reactions   Tramadol Itching and Nausea And Vomiting    Outpatient Encounter Medications as of 07/16/2021  Medication Sig   alendronate (FOSAMAX) 70 MG tablet Take 70 mg by mouth once a week.    aspirin 81 MG tablet Take 81 mg by mouth daily.   atorvastatin (LIPITOR) 80 MG tablet TAKE 1 TABLET BY MOUTH ONCE DAILY AT  6PM   chlorpheniramine-HYDROcodone (TUSSIONEX) 10-8 MG/5ML SUER Take 5 mLs by mouth every 12 (twelve) hours as needed for cough.   dexamethasone (DECADRON) 6 MG tablet Take 1 tablet (6 mg total) by mouth daily.   dextromethorphan-guaiFENesin (MUCINEX DM) 30-600 MG 12hr tablet Take 1 tablet by mouth 2 (two) times daily as needed for cough.   fluticasone (FLONASE) 50 MCG/ACT nasal spray Use 2 spray(s) in each nostril once daily    fluticasone-salmeterol (ADVAIR) 250-50 MCG/ACT AEPB Inhale 1 puff into the lungs in the morning and at bedtime.   ipratropium-albuterol (DUONEB) 0.5-2.5 (3) MG/3ML SOLN Take 3 mLs by nebulization 2 (two) times daily.   metFORMIN (GLUCOPHAGE) 500 MG tablet TAKE 1 TABLET BY MOUTH TWICE DAILY WITH A MEAL   metoprolol tartrate (LOPRESSOR) 25 MG tablet Take 1 tablet by mouth twice daily   nitroGLYCERIN (NITROSTAT) 0.4 MG SL tablet Place 1 tablet (0.4 mg total) under the tongue every 5 (five) minutes as needed for chest pain.   ondansetron (ZOFRAN ODT) 4 MG disintegrating tablet Take 1 tablet (4 mg total) by mouth every 8 (eight) hours as needed for nausea or vomiting.   PROAIR HFA 108 (90 Base) MCG/ACT inhaler Inhale 2 puffs into the lungs every 4 (four) hours as needed for wheezing or shortness of breath.   Vitamin D, Ergocalciferol, (DRISDOL) 50000 units CAPS capsule Take 50,000 Units by mouth once a week.   No facility-administered encounter medications on file as of 07/16/2021.    Patient Active Problem List   Diagnosis Date Noted   COVID-19 virus infection 06/27/2021   COPD (chronic obstructive pulmonary disease) (HCC) 05/21/2021   Hypomagnesemia 05/14/2021   CAD (coronary artery disease)    Severe sepsis (HCC)    Hypokalemia    Acute respiratory failure with hypoxia (HCC)    Asthma exacerbation    Joint ache 04/09/2021   Calcified granuloma of lung (HCC) 03/02/2021   Acute exacerbation of chronic obstructive pulmonary disease (COPD) (HCC) 03/01/2021   COPD exacerbation (HCC) 11/29/2020   Chronic diastolic  CHF (congestive heart failure) (HCC) 11/29/2020   CAP (community acquired pneumonia) 11/29/2020   Rib pain on right side 10/19/2020   Abnormal CXR 10/19/2020   Type 2 diabetes mellitus with cardiac complication (HCC) 10/08/2020   Elevated troponin 10/08/2020   Acute on chronic heart failure with preserved ejection fraction (HFpEF) (HCC) 10/08/2020   COPD with acute exacerbation  (HCC) 08/19/2020   History of non-ST elevation myocardial infarction (NSTEMI) 08/07/2020   Asthma 08/07/2020   Leukocytosis 08/07/2020   Left shoulder pain 07/12/2020   Iron deficiency 04/16/2020   Anemia 04/04/2020   SOB (shortness of breath) 03/25/2020   Cough 09/30/2019   Gallstone pancreatitis    Pre-op evaluation 08/30/2019   Cholecystitis 08/05/2019   Choledocholithiasis    Respiratory illness 06/18/2019   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) 12/29/2018   Chest pain 10/07/2018   Memory change 05/27/2018   Osteoporosis 02/05/2018   Weight loss 06/24/2017   Abdominal pain, left lower quadrant 10/05/2016   Long term current use of systemic steroids 05/16/2016   Elevated erythrocyte sedimentation rate 05/04/2016   Back pain 04/29/2016   Fatigue 05/24/2015   Health care maintenance 01/25/2015   UTI (urinary tract infection) 07/07/2014   Neuropathy 03/24/2014   Diverticulitis 02/24/2013   Reactive airway disease 09/15/2012   Hypertension 09/14/2012   Hypercholesterolemia 09/14/2012   Diabetes mellitus with cardiac complication (HCC) 09/14/2012    Conditions to be addressed/monitored:CHF, COPD, and DMII  Care Plan : [facility] Care Plan  Updates made by Maple Mirza, RN since 07/16/2021 12:00 AM     Problem: Patient with increase ER and hospitalizations related to shortness of breath COPD exacerbations   Priority: High     Long-Range Goal: Patient will report no emergency room visits within the next 90 days   Start Date: 06/04/2021  Expected End Date: 12/28/2021  This Visit's Progress: Not on track  Recent Progress: On track  Priority: High  Note:   Current Barriers:  Knowledge Deficits related to plan of care for management of CHF, COPD, and DMII  Chronic Disease Management support and education needs related to CHF, COPD, and DMII; Patient reporting increase in emergency room visits related to shortness of breath;  Hospital  discharge on 9/2 for COVID.  States since discharge she feels some better.  Is amble to ambulate outside of around the house without difficulties.  Denies any increase in shortness of breath at this time.  Does use rescue inhaler about daily.  Reports compliance with her twice a day Advair Discus and all other medications.  Denies any lower extremity edema.  Denies cough.  RNCM Clinical Goal(s):  Patient will verbalize understanding of plan for management of CHF, COPD, and DMII verbalize basic understanding of CHF, COPD, and DMII disease process and self health management plan to manage conditions take all medications exactly as prescribed and will call provider for medication related questions demonstrate a decrease in CHF, COPD, and DMII exacerbations requiring emergency room visits or hospitalizations experience decrease in ED visits. ED visits in last 6 months = 7 (2 admissions)  through collaboration with RN Care manager, provider, and care team.   Interventions: 1:1 collaboration with primary care provider regarding development and update of comprehensive plan of care as evidenced by provider attestation and co-signature Inter-disciplinary care team collaboration (see longitudinal plan of care) Evaluation of current treatment plan related to  self management and patient's adherence to plan as established by provider;  COPD: (Status:  Goal on track: NO.) Provided patient with basic written and verbal COPD education on self care/management/and exacerbation prevention; Advised patient to track and manage COPD triggers;  Provided instruction about proper use of medications used for management of COPD including inhalers; Advised patient to self assesses COPD action plan zone and make appointment with provider if in the yellow zone for 48 hours without improvement; Discussed the importance of adequate rest and management of fatigue with COPD; Encouraged patient to use nebulizer if rescue inhaler  not helping shortness of breath Discussed contacting providers for shortness of breath or increased need for rescue inhaler or nebulizer prior to needing to call EMS Discussed referral placed to Dr. Meredeth Ide for follow up appointment Encouraged to increase activity as tolerated Work with home health therapy  Heart Failure Interventions:  (Status: Goal on track: NO.) Basic overview and discussion of pathophysiology of Heart Failure reviewed; Provided education on low sodium diet; Reviewed Heart Failure Action Plan in depth and provided written copy; Provided education about placing scale on hard, flat surface; Advised patient to weigh each morning after emptying bladder; Discussed importance of daily weight and advised patient to weigh and record daily; Discussed the importance of keeping all appointments with provider; Discussed when to call provider based on weights and symptoms  Diabetes:  (Status: New goal. Condition stable. Not addressed this visit.) Lab Results  Component Value Date   HGBA1C 7.3 (H) 03/31/2021  Assessed patient's understanding of A1c goal: <7% Provided education to patient about basic DM disease process; Reviewed medications with patient and discussed importance of medication adherence;        Reviewed prescribed diet with patient low salt carb modified diabetic; Advised patient, providing education and rationale, to check cbg daily and record        call provider for findings outside established parameters;         Patient Goals/Self-Care Activities: Patient will self administer medications as prescribed Patient will attend all scheduled provider appointments Patient will continue to perform ADL's independently Develop a rescue plan & follow rescue plan if symptoms flare-up Eliminate symptom triggers at home Consider using nebulizer for increase in shortness of breath Contact provider for increase shortness of breath and increased need for rescue inhaler  Keep  and attend follow-up appointments Dr. Meredeth Ide with Pulmonology should be contacting for follow up appointment Weigh self daily and keep in log for provider review Notify if you gain 2-3 pounds overnight of 5 pounds in 5 days Develop a rescue plan & follow rescue plan if symptoms flare-up Know when to call the doctor Low salt and low carbohydrate diabetic diet Check blood sugars at least daily, keeping log for provider review      Plan:The care management team will reach out to the patient again over the next 24 business days.  Rhae Lerner RN, MSN RN Care Management Coordinator [facility] [phone] [email or web address]

## 2021-07-20 DIAGNOSIS — Z7952 Long term (current) use of systemic steroids: Secondary | ICD-10-CM | POA: Diagnosis not present

## 2021-07-20 DIAGNOSIS — R7402 Elevation of levels of lactic acid dehydrogenase (LDH): Secondary | ICD-10-CM | POA: Diagnosis not present

## 2021-07-20 DIAGNOSIS — G9340 Encephalopathy, unspecified: Secondary | ICD-10-CM | POA: Diagnosis not present

## 2021-07-20 DIAGNOSIS — E1165 Type 2 diabetes mellitus with hyperglycemia: Secondary | ICD-10-CM | POA: Diagnosis not present

## 2021-07-20 DIAGNOSIS — J9621 Acute and chronic respiratory failure with hypoxia: Secondary | ICD-10-CM | POA: Diagnosis not present

## 2021-07-20 DIAGNOSIS — M25562 Pain in left knee: Secondary | ICD-10-CM | POA: Diagnosis not present

## 2021-07-20 DIAGNOSIS — E876 Hypokalemia: Secondary | ICD-10-CM | POA: Diagnosis not present

## 2021-07-20 DIAGNOSIS — M25561 Pain in right knee: Secondary | ICD-10-CM | POA: Diagnosis not present

## 2021-07-20 DIAGNOSIS — Z7951 Long term (current) use of inhaled steroids: Secondary | ICD-10-CM | POA: Diagnosis not present

## 2021-07-20 DIAGNOSIS — U071 COVID-19: Secondary | ICD-10-CM | POA: Diagnosis not present

## 2021-07-20 DIAGNOSIS — J441 Chronic obstructive pulmonary disease with (acute) exacerbation: Secondary | ICD-10-CM | POA: Diagnosis not present

## 2021-07-20 DIAGNOSIS — I5032 Chronic diastolic (congestive) heart failure: Secondary | ICD-10-CM | POA: Diagnosis not present

## 2021-07-20 DIAGNOSIS — E7800 Pure hypercholesterolemia, unspecified: Secondary | ICD-10-CM | POA: Diagnosis not present

## 2021-07-20 DIAGNOSIS — Z602 Problems related to living alone: Secondary | ICD-10-CM | POA: Diagnosis not present

## 2021-07-20 DIAGNOSIS — J45901 Unspecified asthma with (acute) exacerbation: Secondary | ICD-10-CM | POA: Diagnosis not present

## 2021-07-20 DIAGNOSIS — I110 Hypertensive heart disease with heart failure: Secondary | ICD-10-CM | POA: Diagnosis not present

## 2021-07-20 DIAGNOSIS — I2510 Atherosclerotic heart disease of native coronary artery without angina pectoris: Secondary | ICD-10-CM | POA: Diagnosis not present

## 2021-07-20 DIAGNOSIS — E1159 Type 2 diabetes mellitus with other circulatory complications: Secondary | ICD-10-CM | POA: Diagnosis not present

## 2021-07-20 DIAGNOSIS — Z7982 Long term (current) use of aspirin: Secondary | ICD-10-CM | POA: Diagnosis not present

## 2021-07-20 DIAGNOSIS — Z7984 Long term (current) use of oral hypoglycemic drugs: Secondary | ICD-10-CM | POA: Diagnosis not present

## 2021-07-23 ENCOUNTER — Ambulatory Visit (INDEPENDENT_AMBULATORY_CARE_PROVIDER_SITE_OTHER): Payer: Medicare Other

## 2021-07-23 ENCOUNTER — Other Ambulatory Visit: Payer: Self-pay

## 2021-07-23 ENCOUNTER — Ambulatory Visit (INDEPENDENT_AMBULATORY_CARE_PROVIDER_SITE_OTHER): Payer: Medicare Other | Admitting: Internal Medicine

## 2021-07-23 VITALS — BP 118/68 | HR 72 | Temp 98.0°F | Resp 16 | Ht 61.0 in | Wt 122.0 lb

## 2021-07-23 DIAGNOSIS — J9621 Acute and chronic respiratory failure with hypoxia: Secondary | ICD-10-CM | POA: Diagnosis not present

## 2021-07-23 DIAGNOSIS — R634 Abnormal weight loss: Secondary | ICD-10-CM

## 2021-07-23 DIAGNOSIS — Z7984 Long term (current) use of oral hypoglycemic drugs: Secondary | ICD-10-CM | POA: Diagnosis not present

## 2021-07-23 DIAGNOSIS — Z602 Problems related to living alone: Secondary | ICD-10-CM | POA: Diagnosis not present

## 2021-07-23 DIAGNOSIS — R9389 Abnormal findings on diagnostic imaging of other specified body structures: Secondary | ICD-10-CM

## 2021-07-23 DIAGNOSIS — E1159 Type 2 diabetes mellitus with other circulatory complications: Secondary | ICD-10-CM | POA: Diagnosis not present

## 2021-07-23 DIAGNOSIS — E7800 Pure hypercholesterolemia, unspecified: Secondary | ICD-10-CM | POA: Diagnosis not present

## 2021-07-23 DIAGNOSIS — J441 Chronic obstructive pulmonary disease with (acute) exacerbation: Secondary | ICD-10-CM | POA: Diagnosis not present

## 2021-07-23 DIAGNOSIS — J449 Chronic obstructive pulmonary disease, unspecified: Secondary | ICD-10-CM | POA: Diagnosis not present

## 2021-07-23 DIAGNOSIS — R413 Other amnesia: Secondary | ICD-10-CM | POA: Diagnosis not present

## 2021-07-23 DIAGNOSIS — Z7952 Long term (current) use of systemic steroids: Secondary | ICD-10-CM | POA: Diagnosis not present

## 2021-07-23 DIAGNOSIS — D72829 Elevated white blood cell count, unspecified: Secondary | ICD-10-CM | POA: Diagnosis not present

## 2021-07-23 DIAGNOSIS — M25561 Pain in right knee: Secondary | ICD-10-CM | POA: Diagnosis not present

## 2021-07-23 DIAGNOSIS — Z7982 Long term (current) use of aspirin: Secondary | ICD-10-CM | POA: Diagnosis not present

## 2021-07-23 DIAGNOSIS — I10 Essential (primary) hypertension: Secondary | ICD-10-CM | POA: Diagnosis not present

## 2021-07-23 DIAGNOSIS — R7402 Elevation of levels of lactic acid dehydrogenase (LDH): Secondary | ICD-10-CM | POA: Diagnosis not present

## 2021-07-23 DIAGNOSIS — I5032 Chronic diastolic (congestive) heart failure: Secondary | ICD-10-CM

## 2021-07-23 DIAGNOSIS — I25118 Atherosclerotic heart disease of native coronary artery with other forms of angina pectoris: Secondary | ICD-10-CM | POA: Diagnosis not present

## 2021-07-23 DIAGNOSIS — E876 Hypokalemia: Secondary | ICD-10-CM | POA: Diagnosis not present

## 2021-07-23 DIAGNOSIS — M25562 Pain in left knee: Secondary | ICD-10-CM | POA: Diagnosis not present

## 2021-07-23 DIAGNOSIS — G9340 Encephalopathy, unspecified: Secondary | ICD-10-CM | POA: Diagnosis not present

## 2021-07-23 DIAGNOSIS — I110 Hypertensive heart disease with heart failure: Secondary | ICD-10-CM | POA: Diagnosis not present

## 2021-07-23 DIAGNOSIS — E119 Type 2 diabetes mellitus without complications: Secondary | ICD-10-CM

## 2021-07-23 DIAGNOSIS — Z7951 Long term (current) use of inhaled steroids: Secondary | ICD-10-CM | POA: Diagnosis not present

## 2021-07-23 DIAGNOSIS — U071 COVID-19: Secondary | ICD-10-CM | POA: Diagnosis not present

## 2021-07-23 DIAGNOSIS — J45901 Unspecified asthma with (acute) exacerbation: Secondary | ICD-10-CM | POA: Diagnosis not present

## 2021-07-23 DIAGNOSIS — E1165 Type 2 diabetes mellitus with hyperglycemia: Secondary | ICD-10-CM | POA: Diagnosis not present

## 2021-07-23 DIAGNOSIS — I2510 Atherosclerotic heart disease of native coronary artery without angina pectoris: Secondary | ICD-10-CM | POA: Diagnosis not present

## 2021-07-23 DIAGNOSIS — Z8616 Personal history of COVID-19: Secondary | ICD-10-CM

## 2021-07-23 LAB — CBC WITH DIFFERENTIAL/PLATELET
Absolute Monocytes: 580 cells/uL (ref 200–950)
Basophils Absolute: 37 cells/uL (ref 0–200)
Basophils Relative: 0.4 %
Eosinophils Absolute: 616 cells/uL — ABNORMAL HIGH (ref 15–500)
Eosinophils Relative: 6.7 %
HCT: 38.2 % (ref 35.0–45.0)
Hemoglobin: 12.6 g/dL (ref 11.7–15.5)
Lymphs Abs: 2668 cells/uL (ref 850–3900)
MCH: 28.9 pg (ref 27.0–33.0)
MCHC: 33 g/dL (ref 32.0–36.0)
MCV: 87.6 fL (ref 80.0–100.0)
MPV: 11.9 fL (ref 7.5–12.5)
Monocytes Relative: 6.3 %
Neutro Abs: 5299 cells/uL (ref 1500–7800)
Neutrophils Relative %: 57.6 %
Platelets: 222 10*3/uL (ref 140–400)
RBC: 4.36 10*6/uL (ref 3.80–5.10)
RDW: 13.4 % (ref 11.0–15.0)
Total Lymphocyte: 29 %
WBC: 9.2 10*3/uL (ref 3.8–10.8)

## 2021-07-23 LAB — VITAMIN B12: Vitamin B-12: 564 pg/mL (ref 200–1100)

## 2021-07-23 LAB — TSH: TSH: 2.5 mIU/L (ref 0.40–4.50)

## 2021-07-23 NOTE — Progress Notes (Signed)
[name], female   DOB: 07/18/1933, 87 y.o.   MRN: [id number]   Subjective:    [name], female    DOB: Oct 01, 1933, 87 y.o.   MRN: [id number]  This visit occurred during the SARS-CoV-2 public health emergency.  Safety protocols were in place, including screening questions prior to the visit, additional usage of staff PPE, and extensive cleaning of exam room while observing appropriate contact time as indicated for disinfecting solutions.   Patient here for scheduled follow up.   Chief Complaint  Patient presents with   Diabetes   Hyperlipidemia   COPD   .   HPI Recently admitted 06/27/21 - 07/02/21 - after presenting with cough and chest pain.  Covid 19 positive. Treated with remdesivir.  Treated with steroids.  Was also felt to have covid 19 encephalopathy.  Felt to be back to her baseline at discharge.  Discharged with home health.  States since her discharge, she has been doing better.  Breathing better.  No increased cough or congestoin.  Using nebulizer in am.  Is walking.  No chest pain.  Eating.  No nausea or vomiting.  Lives by herself.  Able to do her ADLs.  Blood sugar on recent check 122.  Daughter is concerned regarding her memory - specifically her short term memory.     Past Medical History:  Diagnosis Date   (HFpEF) heart failure with preserved ejection fraction (HCC)    a. TTE 12/19: EF 55-60%, probable HK of the mid apical anterior septal myocardium, Gr1DD, mild AI, mildly dilated LA; b.07/2020 Echo: EF 60-65%, no rwma, Gr2 DD. Nl RV fxn. Mildly dil LA. Mild MR.   Asthma    CAD (coronary artery disease)    a. 09/2018 NSTEMI/PCI: LM min irregs, mLAD 95 (PCI/DES), mLAD-2 60%, LCx mild diff dzs, RCA min irregs; b. 03/2020 MV: EF>65%, no ischemia/scar; c. 07/2020 Cath: LM min irregs, LAD 30p, 60m, 90d, D1/2 min irregs, LCX diff dzs throughout, OM1/2/3 mild dzs, RCA 30p, RPDA/RPAV min irrges. EF 55-65%.   CHF (congestive heart failure)  (HCC)    Diabetes mellitus (HCC)    Hypercholesterolemia    Hypertension    Myocardial infarction (HCC)    Osteopenia    Palpitations    a. 04/2020 Zio: Avg HR 75. 429 SVT episodes, longest 19 secs @ 133. Occas PACs (3.2%). Rare PVCs (<1%).   Polymyalgia rheumatica syndrome (HCC)    Reactive airway disease    Past Surgical History:  Procedure Laterality Date   ABDOMINAL HYSTERECTOMY  1981   prolapse and bleeding, ovaries not removed   BREAST EXCISIONAL BIOPSY Right    CHOLECYSTECTOMY N/A 09/02/2019   Procedure: LAPAROSCOPIC CHOLECYSTECTOMY WITH INTRAOPERATIVE CHOLANGIOGRAM;  Surgeon: Henrene Dodge, MD;  Location: [facility] ORS;  Service: General;  Laterality: N/A;   CORONARY STENT INTERVENTION N/A 10/08/2018   Procedure: CORONARY STENT INTERVENTION;  Surgeon: Iran Ouch, MD;  Location: [facility];  Service: Cardiovascular;  Laterality: N/A;   ENDOSCOPIC RETROGRADE CHOLANGIOPANCREATOGRAPHY (ERCP) WITH PROPOFOL N/A 08/08/2019   Procedure: ENDOSCOPIC RETROGRADE CHOLANGIOPANCREATOGRAPHY (ERCP) WITH PROPOFOL;  Surgeon: Midge Minium, MD;  Location: [facility] END[facility];  Service: Endoscopy;  Laterality: N/A;   LEFT HEART CATH AND CORONARY ANGIOGRAPHY N/A 10/08/2018   Procedure: LEFT HEART CATH AND CORONARY ANGIOGRAPHY;  Surgeon: Iran Ouch, MD;  Location: [facility];  Service: Cardiovascular;  Laterality: N/A;   LEFT HEART CATH AND CORONARY ANGIOGRAPHY N/A 08/10/2020   Procedure: LEFT HEART  CATH AND CORONARY ANGIOGRAPHY possible percutaneous intervention;  Surgeon: Iran Ouch, MD;  Location: [facility];  Service: Cardiovascular;  Laterality: N/A;   UMBILICAL HERNIA REPAIR  7/94   Family History  Problem Relation Age of Onset   Arthritis Mother    Heart disease Mother    Heart attack Father    Throat cancer Sister    Parkinson's disease Sister    COPD Brother    Social History   Socioeconomic History   Marital status: Widowed    Spouse name: Not on  file   Number of children: 3   Years of education: Not on file   Highest education level: Not on file  Occupational History   Not on file  Tobacco Use   Smoking status: Never   Smokeless tobacco: Never  Vaping Use   Vaping Use: Never used  Substance and Sexual Activity   Alcohol use: No    Alcohol/week: 0.0 standard drinks   Drug use: No   Sexual activity: Not Currently  Other Topics Concern   Not on file  Social History Narrative   No smoking; no alcohol; in [place]; worked in Designer, fashion/clothing. Lives by self in [place]. Does all of her own housework. Dtr does food shopping for her.   Social Determinants of Health   Financial Resource Strain: Low Risk    Difficulty of Paying Living Expenses: Not hard at all  Food Insecurity: No Food Insecurity   Worried About Programme researcher, broadcasting/film/video in the Last Year: Never true   Ran Out of Food in the Last Year: Never true  Transportation Needs: No Transportation Needs   Lack of Transportation (Medical): No   Lack of Transportation (Non-Medical): No  Physical Activity: Not on file  Stress: No Stress Concern Present   Feeling of Stress : Not at all  Social Connections: Moderately Integrated   Frequency of Communication with Friends and Family: More than three times a week   Frequency of Social Gatherings with Friends and Family: More than three times a week   Attends Religious Services: More than 4 times per year   Active Member of [employer] or Organizations: Yes   Attends Banker Meetings: Not on file   Marital Status: Widowed     Review of Systems  Constitutional:  Negative for appetite change and unexpected weight change.  HENT:  Negative for congestion and sinus pressure.   Respiratory:  Negative for cough and chest tightness.        Breathing improved from hospitalization.    Cardiovascular:  Negative for chest pain, palpitations and leg swelling.  Gastrointestinal:  Negative for abdominal pain, diarrhea, nausea and vomiting.   Genitourinary:  Negative for difficulty urinating and dysuria.  Musculoskeletal:  Negative for joint swelling and myalgias.  Skin:  Negative for color change and rash.  Neurological:  Negative for dizziness, light-headedness and headaches.  Psychiatric/Behavioral:  Negative for agitation and dysphoric mood.       Objective:     BP 118/68   Pulse 72   Temp 98 F (36.7 C)   Resp 16   Ht 5\' 1"  (1.549 m)   Wt 122 lb (55.3 kg)   SpO2 97%   BMI 23.05 kg/m  Wt Readings from Last 3 Encounters:  07/23/21 122 lb (55.3 kg)  06/26/21 118 lb (53.5 kg)  06/09/21 123 lb 3.2 oz (55.9 kg)    Physical Exam Vitals reviewed.  Constitutional:      General: She  is not in acute distress.    Appearance: Normal appearance.  HENT:     Head: Normocephalic and atraumatic.     Right Ear: External ear normal.     Left Ear: External ear normal.  Eyes:     General: No scleral icterus.       Right eye: No discharge.        Left eye: No discharge.     Conjunctiva/sclera: Conjunctivae normal.  Neck:     Thyroid: No thyromegaly.  Cardiovascular:     Rate and Rhythm: Normal rate and regular rhythm.  Pulmonary:     Effort: No respiratory distress.     Breath sounds: Normal breath sounds. No wheezing.  Abdominal:     General: Bowel sounds are normal.     Palpations: Abdomen is soft.     Tenderness: There is no abdominal tenderness.  Musculoskeletal:        General: No swelling or tenderness.     Cervical back: Neck supple. No tenderness.  Lymphadenopathy:     Cervical: No cervical adenopathy.  Skin:    Findings: No erythema or rash.  Neurological:     Mental Status: She is alert.  Psychiatric:        Mood and Affect: Mood normal.        Behavior: Behavior normal.     Outpatient Encounter Medications as of 07/23/2021  Medication Sig   alendronate (FOSAMAX) 70 MG tablet Take 70 mg by mouth once a week.    aspirin 81 MG tablet Take 81 mg by mouth daily.   atorvastatin (LIPITOR) 80 MG  tablet TAKE 1 TABLET BY MOUTH ONCE DAILY AT  6PM   fluticasone (FLONASE) 50 MCG/ACT nasal spray Use 2 spray(s) in each nostril once daily   fluticasone-salmeterol (ADVAIR) 250-50 MCG/ACT AEPB Inhale 1 puff into the lungs in the morning and at bedtime.   ipratropium-albuterol (DUONEB) 0.5-2.5 (3) MG/3ML SOLN Take 3 mLs by nebulization 2 (two) times daily.   metFORMIN (GLUCOPHAGE) 500 MG tablet TAKE 1 TABLET BY MOUTH TWICE DAILY WITH A MEAL   metoprolol tartrate (LOPRESSOR) 25 MG tablet Take 1 tablet by mouth twice daily   nitroGLYCERIN (NITROSTAT) 0.4 MG SL tablet Place 1 tablet (0.4 mg total) under the tongue every 5 (five) minutes as needed for chest pain.   PROAIR HFA 108 (90 Base) MCG/ACT inhaler Inhale 2 puffs into the lungs every 4 (four) hours as needed for wheezing or shortness of breath.   Vitamin D, Ergocalciferol, (DRISDOL) 50000 units CAPS capsule Take 50,000 Units by mouth once a week.   [DISCONTINUED] chlorpheniramine-HYDROcodone (TUSSIONEX) 10-8 MG/5ML SUER Take 5 mLs by mouth every 12 (twelve) hours as needed for cough.   [DISCONTINUED] dexamethasone (DECADRON) 6 MG tablet Take 1 tablet (6 mg total) by mouth daily.   [DISCONTINUED] dextromethorphan-guaiFENesin (MUCINEX DM) 30-600 MG 12hr tablet Take 1 tablet by mouth 2 (two) times daily as needed for cough.   [DISCONTINUED] ondansetron (ZOFRAN ODT) 4 MG disintegrating tablet Take 1 tablet (4 mg total) by mouth every 8 (eight) hours as needed for nausea or vomiting.   No facility-administered encounter medications on file as of 07/23/2021.     Lab Results  Component Value Date   WBC 9.2 07/23/2021   HGB 12.6 07/23/2021   HCT 38.2 07/23/2021   PLT 222 07/23/2021   GLUCOSE 198 (H) 07/02/2021   CHOL 175 06/27/2021   TRIG 113 06/27/2021   HDL 44 06/27/2021   LDLDIRECT 136.0 07/03/2020  LDLCALC 108 (H) 06/27/2021   ALT 12 07/02/2021   AST 19 07/02/2021   NA 136 07/02/2021   K 4.8 07/02/2021   CL 108 07/02/2021   CREATININE  0.81 07/02/2021   BUN 30 (H) 07/02/2021   CO2 23 07/02/2021   TSH 2.50 07/23/2021   INR 1.1 08/07/2020   HGBA1C 7.3 (H) 03/31/2021   MICROALBUR 1.1 07/23/2021    DG Chest Port 1 View  Result Date: 06/29/2021 CLINICAL DATA:  History of sepsis, COVID positive EXAM: PORTABLE CHEST 1 VIEW COMPARISON:  06/26/2021, 03/01/2021, CT chest 06/27/2021 FINDINGS: No consolidation or effusion. Vague interstitial opacities likely due to inflammatory process. Calcified nodule left base. Stable cardiomediastinal silhouette with aortic atherosclerosis. No pneumothorax is seen IMPRESSION: Mild diffuse increased interstitial opacity suggesting interstitial inflammatory process. No focal airspace disease Electronically Signed   By: Jasmine Pang M.D.   On: 06/29/2021 19:57   ECHOCARDIOGRAM COMPLETE  Result Date: 06/28/2021    ECHOCARDIOGRAM REPORT   Patient Name:   TACHA MANNI [facility] Date of Exam: 06/28/2021 Medical Rec #:  [id number]          Height:       61.0 in Accession #:    [id number]         Weight:       118.0 lb Date of Birth:  1933/03/21          BSA:          1.509 m Patient Age:    88 years           BP:           130/57 mmHg Patient Gender: F                  HR:           92 bpm. Exam Location:  [facility] Procedure: 2D Echo, Color Doppler and Cardiac Doppler Indications:     R07.9 Chest Pain  History:         Patient has prior history of Echocardiogram examinations, most                  recent 08/08/2020. HFpEF and CHF, CAD; Risk Factors:Diabetes,                  Hypertension and HCL. Pt tested positive for COVID-19 on                  06/27/21.  Sonographer:     Humphrey Rolls Referring Phys:  [id number] Raymon Mutton DUNN Diagnosing Phys: Lorine Bears MD IMPRESSIONS  1. Left ventricular ejection fraction, by estimation, is 55 to 60%. The left ventricle has normal function. The left ventricle has no regional wall motion abnormalities. Left ventricular diastolic parameters are indeterminate.  2. Right ventricular systolic  function is normal. The right ventricular size is normal. There is mildly elevated pulmonary artery systolic pressure.  3. The mitral valve is normal in structure. Mild to moderate mitral valve regurgitation. No evidence of mitral stenosis.  4. The aortic valve is normal in structure. Aortic valve regurgitation is not visualized. Mild aortic valve sclerosis is present, with no evidence of aortic valve stenosis.  5. The inferior vena cava is normal in size with greater than 50% respiratory variability, suggesting right atrial pressure of 3 mmHg. FINDINGS  Left Ventricle: Left ventricular ejection fraction, by estimation, is 55 to 60%. The left ventricle has normal function. The left ventricle has no regional wall motion abnormalities. The  left ventricular internal cavity size was normal in size. There is  no left ventricular hypertrophy. Left ventricular diastolic parameters are indeterminate. Right Ventricle: The right ventricular size is normal. No increase in right ventricular wall thickness. Right ventricular systolic function is normal. There is mildly elevated pulmonary artery systolic pressure. The tricuspid regurgitant velocity is 2.87  m/s, and with an assumed right atrial pressure of 3 mmHg, the estimated right ventricular systolic pressure is 35.9 mmHg. Left Atrium: Left atrial size was normal in size. Right Atrium: Right atrial size was normal in size. Pericardium: There is no evidence of pericardial effusion. Mitral Valve: The mitral valve is normal in structure. Mild mitral annular calcification. Mild to moderate mitral valve regurgitation, with posteriorly-directed jet. No evidence of mitral valve stenosis. MV peak gradient, 4.8 mmHg. The mean mitral valve gradient is 2.0 mmHg. Tricuspid Valve: The tricuspid valve is normal in structure. Tricuspid valve regurgitation is not demonstrated. No evidence of tricuspid stenosis. Aortic Valve: The aortic valve is normal in structure. Aortic valve regurgitation  is not visualized. Mild aortic valve sclerosis is present, with no evidence of aortic valve stenosis. Aortic valve mean gradient measures 7.0 mmHg. Aortic valve peak gradient measures 11.0 mmHg. Aortic valve area, by VTI measures 2.58 cm. Pulmonic Valve: The pulmonic valve was normal in structure. Pulmonic valve regurgitation is mild. No evidence of pulmonic stenosis. Aorta: The aortic root is normal in size and structure. Venous: The inferior vena cava was not well visualized. The inferior vena cava is normal in size with greater than 50% respiratory variability, suggesting right atrial pressure of 3 mmHg. IAS/Shunts: No atrial level shunt detected by color flow Doppler.  LEFT VENTRICLE PLAX 2D LVIDd:         3.90 cm  Diastology LVIDs:         2.40 cm  LV e' medial:    5.44 cm/s LV PW:         0.90 cm  LV E/e' medial:  19.5 LV IVS:        1.00 cm  LV e' lateral:   9.46 cm/s LVOT diam:     2.30 cm  LV E/e' lateral: 11.2 LV SV:         70 LV SV Index:   47 LVOT Area:     4.15 cm  RIGHT VENTRICLE RV Basal diam:  2.90 cm LEFT ATRIUM             Index       RIGHT ATRIUM           Index LA diam:        3.80 cm 2.52 cm/m  RA Area:     11.40 cm LA Vol (A2C):   36.4 ml 24.12 ml/m RA Volume:   22.70 ml  15.04 ml/m LA Vol (A4C):   50.0 ml 33.13 ml/m LA Biplane Vol: 44.4 ml 29.42 ml/m  AORTIC VALVE                    PULMONIC VALVE AV Area (Vmax):    2.30 cm     PV Vmax:       0.89 m/s AV Area (Vmean):   2.23 cm     PV Vmean:      60.700 cm/s AV Area (VTI):     2.58 cm     PV VTI:        0.164 m AV Vmax:           166.00  cm/s  PV Peak grad:  3.2 mmHg AV Vmean:          122.000 cm/s PV Mean grad:  2.0 mmHg AV VTI:            0.272 m AV Peak Grad:      11.0 mmHg AV Mean Grad:      7.0 mmHg LVOT Vmax:         92.00 cm/s LVOT Vmean:        65.600 cm/s LVOT VTI:          0.169 m LVOT/AV VTI ratio: 0.62  AORTA Ao Root diam: 2.70 cm MITRAL VALVE                TRICUSPID VALVE MV Area (PHT): 3.89 cm     TR Peak grad:    32.9 mmHg MV Area VTI:   2.53 cm     TR Vmax:        287.00 cm/s MV Peak grad:  4.8 mmHg MV Mean grad:  2.0 mmHg     SHUNTS MV Vmax:       1.10 m/s     Systemic VTI:  0.17 m MV Vmean:      67.2 cm/s    Systemic Diam: 2.30 cm MV Decel Time: 195 msec MV E velocity: 106.00 cm/s MV A velocity: 102.00 cm/s MV E/A ratio:  1.04 Lorine Bears MD Electronically signed by Lorine Bears MD Signature Date/Time: 06/28/2021/1:14:56 PM    Final        Assessment & Plan:   Problem List Items Addressed This Visit     Abnormal CXR    Recheck cxr to confirm clear.       Relevant Orders   DG Chest 2 View (Completed)   Chronic diastolic CHF (congestive heart failure) (HCC)    Continue lisinopril, imdur and metoprolol.  Breathing at baseline now.  Follow.       COPD (chronic obstructive pulmonary disease) (HCC)    Breathing improved now.  Using nebulizer q am.  Using advair.  Discussed recurring ER visits and admission.  Discussed referral to pulmonary.  She is agreeable.        Relevant Orders   Ambulatory referral to Pulmonology   COPD exacerbation (HCC) - Primary   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)    Known CAD s/p PCI/DES - mid LAD.  Continue metoprolol, imdur and lisinopril.  No chest pain.  Breathing stable.        History of COVID-19    Recently admitted and diagnosed with covid.  Breathing better.  No increased cough or congestion.  No residual problems.        Hypercholesterolemia    Continue lipitor.  Low cholesterol diet and exercise.  Follow lipid panel and liver function tests.        Hypertension    Continue lisinopril, imdur and metoprolol.  Blood pressure as outlined.  Follow pressures.  Follow metabolic panel.       Leukocytosis    Recheck cbc today.  Off steroids.       Relevant Orders   CBC with Differential/Platelet (Completed)   Memory change    Daughter reports concern regarding memory, specifically short term memory.  Check  mini mental status.  Check routine labs - B12.  Follow.        Relevant Orders   TSH (Completed)   Vitamin B12 (Completed)   Type 2 diabetes mellitus with cardiac  complication (HCC)    Low carb diet and exercise as tolerated.  Follow sugars.  Prednisone - elevation of sugars.  Sugar appears to be improved now.  Follow metb and a1c.       Weight loss    Weight up a few pounds from previous check.  Eating.  Follow.        Other Visit Diagnoses     Type 2 diabetes mellitus without complication, without long-term current use of insulin (HCC)       Relevant Orders   Microalbumin / creatinine urine ratio (Completed)        Dale Clear[place], MD

## 2021-07-24 LAB — MICROALBUMIN / CREATININE URINE RATIO
Creatinine, Urine: 101 mg/dL (ref 20–275)
Microalb Creat Ratio: 11 mcg/mg creat (ref <30)
Microalb, Ur: 1.1 mg/dL

## 2021-07-26 ENCOUNTER — Encounter: Payer: Self-pay | Admitting: Internal Medicine

## 2021-07-26 NOTE — Assessment & Plan Note (Signed)
Known CAD s/p PCI/DES - mid LAD.  Continue metoprolol, imdur and lisinopril.  No chest pain.  Breathing stable.   

## 2021-07-26 NOTE — Assessment & Plan Note (Signed)
Recheck cxr to confirm clear.

## 2021-07-26 NOTE — Assessment & Plan Note (Addendum)
Continue lisinopril, imdur and metoprolol.  Blood pressure as outlined.  Follow pressures.  Follow metabolic panel.

## 2021-07-26 NOTE — Assessment & Plan Note (Signed)
Recently admitted and diagnosed with covid.  Breathing better.  No increased cough or congestion.  No residual problems.

## 2021-07-26 NOTE — Assessment & Plan Note (Signed)
Continue lisinopril, imdur and metoprolol.  Breathing at baseline now.  Follow.  

## 2021-07-26 NOTE — Assessment & Plan Note (Signed)
Continue lipitor.  Low cholesterol diet and exercise.  Follow lipid panel and liver function tests.   

## 2021-07-26 NOTE — Assessment & Plan Note (Addendum)
Low carb diet and exercise as tolerated.  Follow sugars.  Prednisone - elevation of sugars.  Sugar appears to be improved now.  Follow metb and a1c.

## 2021-07-26 NOTE — Assessment & Plan Note (Signed)
Recheck cbc today.  Off steroids.

## 2021-07-26 NOTE — Assessment & Plan Note (Signed)
Daughter reports concern regarding memory, specifically short term memory.  Check mini mental status.  Check routine labs - B12.  Follow.

## 2021-07-26 NOTE — Assessment & Plan Note (Signed)
Weight up a few pounds from previous check.  Eating.  Follow.

## 2021-07-26 NOTE — Assessment & Plan Note (Signed)
Breathing improved now.  Using nebulizer q am.  Using advair.  Discussed recurring ER visits and admission.  Discussed referral to pulmonary.  She is agreeable.

## 2021-07-27 ENCOUNTER — Telehealth: Payer: Self-pay | Admitting: Internal Medicine

## 2021-07-27 ENCOUNTER — Encounter: Payer: Self-pay | Admitting: Internal Medicine

## 2021-07-27 DIAGNOSIS — M25562 Pain in left knee: Secondary | ICD-10-CM | POA: Diagnosis not present

## 2021-07-27 DIAGNOSIS — I2510 Atherosclerotic heart disease of native coronary artery without angina pectoris: Secondary | ICD-10-CM | POA: Diagnosis not present

## 2021-07-27 DIAGNOSIS — E1165 Type 2 diabetes mellitus with hyperglycemia: Secondary | ICD-10-CM | POA: Diagnosis not present

## 2021-07-27 DIAGNOSIS — J45901 Unspecified asthma with (acute) exacerbation: Secondary | ICD-10-CM | POA: Diagnosis not present

## 2021-07-27 DIAGNOSIS — I5032 Chronic diastolic (congestive) heart failure: Secondary | ICD-10-CM | POA: Diagnosis not present

## 2021-07-27 DIAGNOSIS — Z7984 Long term (current) use of oral hypoglycemic drugs: Secondary | ICD-10-CM | POA: Diagnosis not present

## 2021-07-27 DIAGNOSIS — E1159 Type 2 diabetes mellitus with other circulatory complications: Secondary | ICD-10-CM | POA: Diagnosis not present

## 2021-07-27 DIAGNOSIS — M25561 Pain in right knee: Secondary | ICD-10-CM | POA: Diagnosis not present

## 2021-07-27 DIAGNOSIS — I110 Hypertensive heart disease with heart failure: Secondary | ICD-10-CM | POA: Diagnosis not present

## 2021-07-27 DIAGNOSIS — R7402 Elevation of levels of lactic acid dehydrogenase (LDH): Secondary | ICD-10-CM | POA: Diagnosis not present

## 2021-07-27 DIAGNOSIS — E876 Hypokalemia: Secondary | ICD-10-CM | POA: Diagnosis not present

## 2021-07-27 DIAGNOSIS — E7800 Pure hypercholesterolemia, unspecified: Secondary | ICD-10-CM | POA: Diagnosis not present

## 2021-07-27 DIAGNOSIS — Z602 Problems related to living alone: Secondary | ICD-10-CM | POA: Diagnosis not present

## 2021-07-27 DIAGNOSIS — G9340 Encephalopathy, unspecified: Secondary | ICD-10-CM | POA: Diagnosis not present

## 2021-07-27 DIAGNOSIS — U071 COVID-19: Secondary | ICD-10-CM | POA: Diagnosis not present

## 2021-07-27 DIAGNOSIS — Z7982 Long term (current) use of aspirin: Secondary | ICD-10-CM | POA: Diagnosis not present

## 2021-07-27 DIAGNOSIS — Z7952 Long term (current) use of systemic steroids: Secondary | ICD-10-CM | POA: Diagnosis not present

## 2021-07-27 DIAGNOSIS — Z7951 Long term (current) use of inhaled steroids: Secondary | ICD-10-CM | POA: Diagnosis not present

## 2021-07-27 DIAGNOSIS — J9621 Acute and chronic respiratory failure with hypoxia: Secondary | ICD-10-CM | POA: Diagnosis not present

## 2021-07-27 DIAGNOSIS — J441 Chronic obstructive pulmonary disease with (acute) exacerbation: Secondary | ICD-10-CM | POA: Diagnosis not present

## 2021-07-27 NOTE — Telephone Encounter (Signed)
Shari Prows from [employer] is calling to check on the status of paperwork they sent over for the patient on 07/26/21.Please call him at [phone].

## 2021-07-29 DIAGNOSIS — Z7951 Long term (current) use of inhaled steroids: Secondary | ICD-10-CM | POA: Diagnosis not present

## 2021-07-29 DIAGNOSIS — I5032 Chronic diastolic (congestive) heart failure: Secondary | ICD-10-CM | POA: Diagnosis not present

## 2021-07-29 DIAGNOSIS — R7402 Elevation of levels of lactic acid dehydrogenase (LDH): Secondary | ICD-10-CM | POA: Diagnosis not present

## 2021-07-29 DIAGNOSIS — M25561 Pain in right knee: Secondary | ICD-10-CM | POA: Diagnosis not present

## 2021-07-29 DIAGNOSIS — G9340 Encephalopathy, unspecified: Secondary | ICD-10-CM | POA: Diagnosis not present

## 2021-07-29 DIAGNOSIS — J45901 Unspecified asthma with (acute) exacerbation: Secondary | ICD-10-CM | POA: Diagnosis not present

## 2021-07-29 DIAGNOSIS — I110 Hypertensive heart disease with heart failure: Secondary | ICD-10-CM | POA: Diagnosis not present

## 2021-07-29 DIAGNOSIS — E876 Hypokalemia: Secondary | ICD-10-CM | POA: Diagnosis not present

## 2021-07-29 DIAGNOSIS — Z602 Problems related to living alone: Secondary | ICD-10-CM | POA: Diagnosis not present

## 2021-07-29 DIAGNOSIS — Z7984 Long term (current) use of oral hypoglycemic drugs: Secondary | ICD-10-CM | POA: Diagnosis not present

## 2021-07-29 DIAGNOSIS — Z7952 Long term (current) use of systemic steroids: Secondary | ICD-10-CM | POA: Diagnosis not present

## 2021-07-29 DIAGNOSIS — M25562 Pain in left knee: Secondary | ICD-10-CM | POA: Diagnosis not present

## 2021-07-29 DIAGNOSIS — E7800 Pure hypercholesterolemia, unspecified: Secondary | ICD-10-CM | POA: Diagnosis not present

## 2021-07-29 DIAGNOSIS — E1165 Type 2 diabetes mellitus with hyperglycemia: Secondary | ICD-10-CM | POA: Diagnosis not present

## 2021-07-29 DIAGNOSIS — I2510 Atherosclerotic heart disease of native coronary artery without angina pectoris: Secondary | ICD-10-CM | POA: Diagnosis not present

## 2021-07-29 DIAGNOSIS — E1159 Type 2 diabetes mellitus with other circulatory complications: Secondary | ICD-10-CM | POA: Diagnosis not present

## 2021-07-29 DIAGNOSIS — U071 COVID-19: Secondary | ICD-10-CM | POA: Diagnosis not present

## 2021-07-29 DIAGNOSIS — J9621 Acute and chronic respiratory failure with hypoxia: Secondary | ICD-10-CM | POA: Diagnosis not present

## 2021-07-29 DIAGNOSIS — Z7982 Long term (current) use of aspirin: Secondary | ICD-10-CM | POA: Diagnosis not present

## 2021-07-29 DIAGNOSIS — J441 Chronic obstructive pulmonary disease with (acute) exacerbation: Secondary | ICD-10-CM | POA: Diagnosis not present

## 2021-07-29 NOTE — Telephone Encounter (Signed)
Spoke with patient to confirm that this is not something that she requested. She is not interested in [place] sensor. Tried to call [facility] on CB number listed and message stated that "pharmacy switch board is unavailable"

## 2021-07-29 NOTE — Telephone Encounter (Signed)
Shari Prows is calling back in to check on status of paperwork.   Please advise

## 2021-07-30 DIAGNOSIS — J449 Chronic obstructive pulmonary disease, unspecified: Secondary | ICD-10-CM | POA: Diagnosis not present

## 2021-07-30 DIAGNOSIS — E119 Type 2 diabetes mellitus without complications: Secondary | ICD-10-CM | POA: Diagnosis not present

## 2021-07-30 DIAGNOSIS — J441 Chronic obstructive pulmonary disease with (acute) exacerbation: Secondary | ICD-10-CM | POA: Diagnosis not present

## 2021-07-30 DIAGNOSIS — I5032 Chronic diastolic (congestive) heart failure: Secondary | ICD-10-CM

## 2021-08-02 NOTE — Telephone Encounter (Signed)
Tanya Cole calling back in and informed of the below

## 2021-08-03 ENCOUNTER — Other Ambulatory Visit: Payer: Self-pay | Admitting: Physician Assistant

## 2021-08-04 ENCOUNTER — Telehealth: Payer: Medicare HMO

## 2021-08-04 DIAGNOSIS — U071 COVID-19: Secondary | ICD-10-CM | POA: Diagnosis not present

## 2021-08-04 DIAGNOSIS — J45901 Unspecified asthma with (acute) exacerbation: Secondary | ICD-10-CM | POA: Diagnosis not present

## 2021-08-04 DIAGNOSIS — Z602 Problems related to living alone: Secondary | ICD-10-CM | POA: Diagnosis not present

## 2021-08-04 DIAGNOSIS — M25561 Pain in right knee: Secondary | ICD-10-CM | POA: Diagnosis not present

## 2021-08-04 DIAGNOSIS — Z7952 Long term (current) use of systemic steroids: Secondary | ICD-10-CM | POA: Diagnosis not present

## 2021-08-04 DIAGNOSIS — E7800 Pure hypercholesterolemia, unspecified: Secondary | ICD-10-CM | POA: Diagnosis not present

## 2021-08-04 DIAGNOSIS — E876 Hypokalemia: Secondary | ICD-10-CM | POA: Diagnosis not present

## 2021-08-04 DIAGNOSIS — I5032 Chronic diastolic (congestive) heart failure: Secondary | ICD-10-CM | POA: Diagnosis not present

## 2021-08-04 DIAGNOSIS — Z7982 Long term (current) use of aspirin: Secondary | ICD-10-CM | POA: Diagnosis not present

## 2021-08-04 DIAGNOSIS — J9621 Acute and chronic respiratory failure with hypoxia: Secondary | ICD-10-CM | POA: Diagnosis not present

## 2021-08-04 DIAGNOSIS — R7402 Elevation of levels of lactic acid dehydrogenase (LDH): Secondary | ICD-10-CM | POA: Diagnosis not present

## 2021-08-04 DIAGNOSIS — Z7984 Long term (current) use of oral hypoglycemic drugs: Secondary | ICD-10-CM | POA: Diagnosis not present

## 2021-08-04 DIAGNOSIS — M25562 Pain in left knee: Secondary | ICD-10-CM | POA: Diagnosis not present

## 2021-08-04 DIAGNOSIS — I2510 Atherosclerotic heart disease of native coronary artery without angina pectoris: Secondary | ICD-10-CM | POA: Diagnosis not present

## 2021-08-04 DIAGNOSIS — I110 Hypertensive heart disease with heart failure: Secondary | ICD-10-CM | POA: Diagnosis not present

## 2021-08-04 DIAGNOSIS — E1165 Type 2 diabetes mellitus with hyperglycemia: Secondary | ICD-10-CM | POA: Diagnosis not present

## 2021-08-04 DIAGNOSIS — J441 Chronic obstructive pulmonary disease with (acute) exacerbation: Secondary | ICD-10-CM | POA: Diagnosis not present

## 2021-08-04 DIAGNOSIS — E1159 Type 2 diabetes mellitus with other circulatory complications: Secondary | ICD-10-CM | POA: Diagnosis not present

## 2021-08-04 DIAGNOSIS — G9340 Encephalopathy, unspecified: Secondary | ICD-10-CM | POA: Diagnosis not present

## 2021-08-04 DIAGNOSIS — Z7951 Long term (current) use of inhaled steroids: Secondary | ICD-10-CM | POA: Diagnosis not present

## 2021-08-05 DIAGNOSIS — E876 Hypokalemia: Secondary | ICD-10-CM | POA: Diagnosis not present

## 2021-08-05 DIAGNOSIS — Z7984 Long term (current) use of oral hypoglycemic drugs: Secondary | ICD-10-CM | POA: Diagnosis not present

## 2021-08-05 DIAGNOSIS — J45901 Unspecified asthma with (acute) exacerbation: Secondary | ICD-10-CM | POA: Diagnosis not present

## 2021-08-05 DIAGNOSIS — I2510 Atherosclerotic heart disease of native coronary artery without angina pectoris: Secondary | ICD-10-CM | POA: Diagnosis not present

## 2021-08-05 DIAGNOSIS — Z7982 Long term (current) use of aspirin: Secondary | ICD-10-CM | POA: Diagnosis not present

## 2021-08-05 DIAGNOSIS — Z7951 Long term (current) use of inhaled steroids: Secondary | ICD-10-CM | POA: Diagnosis not present

## 2021-08-05 DIAGNOSIS — G9340 Encephalopathy, unspecified: Secondary | ICD-10-CM | POA: Diagnosis not present

## 2021-08-05 DIAGNOSIS — M25561 Pain in right knee: Secondary | ICD-10-CM | POA: Diagnosis not present

## 2021-08-05 DIAGNOSIS — E1165 Type 2 diabetes mellitus with hyperglycemia: Secondary | ICD-10-CM | POA: Diagnosis not present

## 2021-08-05 DIAGNOSIS — R7402 Elevation of levels of lactic acid dehydrogenase (LDH): Secondary | ICD-10-CM | POA: Diagnosis not present

## 2021-08-05 DIAGNOSIS — Z602 Problems related to living alone: Secondary | ICD-10-CM | POA: Diagnosis not present

## 2021-08-05 DIAGNOSIS — J441 Chronic obstructive pulmonary disease with (acute) exacerbation: Secondary | ICD-10-CM | POA: Diagnosis not present

## 2021-08-05 DIAGNOSIS — I5032 Chronic diastolic (congestive) heart failure: Secondary | ICD-10-CM | POA: Diagnosis not present

## 2021-08-05 DIAGNOSIS — E1159 Type 2 diabetes mellitus with other circulatory complications: Secondary | ICD-10-CM | POA: Diagnosis not present

## 2021-08-05 DIAGNOSIS — M25562 Pain in left knee: Secondary | ICD-10-CM | POA: Diagnosis not present

## 2021-08-05 DIAGNOSIS — Z7952 Long term (current) use of systemic steroids: Secondary | ICD-10-CM | POA: Diagnosis not present

## 2021-08-05 DIAGNOSIS — U071 COVID-19: Secondary | ICD-10-CM | POA: Diagnosis not present

## 2021-08-05 DIAGNOSIS — I110 Hypertensive heart disease with heart failure: Secondary | ICD-10-CM | POA: Diagnosis not present

## 2021-08-05 DIAGNOSIS — J9621 Acute and chronic respiratory failure with hypoxia: Secondary | ICD-10-CM | POA: Diagnosis not present

## 2021-08-05 DIAGNOSIS — E7800 Pure hypercholesterolemia, unspecified: Secondary | ICD-10-CM | POA: Diagnosis not present

## 2021-08-08 ENCOUNTER — Other Ambulatory Visit: Payer: Self-pay | Admitting: Physician Assistant

## 2021-08-11 DIAGNOSIS — Z7951 Long term (current) use of inhaled steroids: Secondary | ICD-10-CM | POA: Diagnosis not present

## 2021-08-11 DIAGNOSIS — Z7984 Long term (current) use of oral hypoglycemic drugs: Secondary | ICD-10-CM | POA: Diagnosis not present

## 2021-08-11 DIAGNOSIS — R7402 Elevation of levels of lactic acid dehydrogenase (LDH): Secondary | ICD-10-CM | POA: Diagnosis not present

## 2021-08-11 DIAGNOSIS — U071 COVID-19: Secondary | ICD-10-CM | POA: Diagnosis not present

## 2021-08-11 DIAGNOSIS — E1159 Type 2 diabetes mellitus with other circulatory complications: Secondary | ICD-10-CM | POA: Diagnosis not present

## 2021-08-11 DIAGNOSIS — E876 Hypokalemia: Secondary | ICD-10-CM | POA: Diagnosis not present

## 2021-08-11 DIAGNOSIS — M25561 Pain in right knee: Secondary | ICD-10-CM | POA: Diagnosis not present

## 2021-08-11 DIAGNOSIS — I5032 Chronic diastolic (congestive) heart failure: Secondary | ICD-10-CM | POA: Diagnosis not present

## 2021-08-11 DIAGNOSIS — Z7952 Long term (current) use of systemic steroids: Secondary | ICD-10-CM | POA: Diagnosis not present

## 2021-08-11 DIAGNOSIS — I2510 Atherosclerotic heart disease of native coronary artery without angina pectoris: Secondary | ICD-10-CM | POA: Diagnosis not present

## 2021-08-11 DIAGNOSIS — M25562 Pain in left knee: Secondary | ICD-10-CM | POA: Diagnosis not present

## 2021-08-11 DIAGNOSIS — E1165 Type 2 diabetes mellitus with hyperglycemia: Secondary | ICD-10-CM | POA: Diagnosis not present

## 2021-08-11 DIAGNOSIS — I110 Hypertensive heart disease with heart failure: Secondary | ICD-10-CM | POA: Diagnosis not present

## 2021-08-11 DIAGNOSIS — E7800 Pure hypercholesterolemia, unspecified: Secondary | ICD-10-CM | POA: Diagnosis not present

## 2021-08-11 DIAGNOSIS — J441 Chronic obstructive pulmonary disease with (acute) exacerbation: Secondary | ICD-10-CM | POA: Diagnosis not present

## 2021-08-11 DIAGNOSIS — G9340 Encephalopathy, unspecified: Secondary | ICD-10-CM | POA: Diagnosis not present

## 2021-08-11 DIAGNOSIS — J45901 Unspecified asthma with (acute) exacerbation: Secondary | ICD-10-CM | POA: Diagnosis not present

## 2021-08-11 DIAGNOSIS — Z602 Problems related to living alone: Secondary | ICD-10-CM | POA: Diagnosis not present

## 2021-08-11 DIAGNOSIS — Z7982 Long term (current) use of aspirin: Secondary | ICD-10-CM | POA: Diagnosis not present

## 2021-08-11 DIAGNOSIS — J9621 Acute and chronic respiratory failure with hypoxia: Secondary | ICD-10-CM | POA: Diagnosis not present

## 2021-08-12 ENCOUNTER — Telehealth: Payer: Self-pay | Admitting: Internal Medicine

## 2021-08-12 NOTE — Telephone Encounter (Signed)
Received notification - overdue mammogram.  If still desires mammogram screening - need to schedule.

## 2021-08-13 ENCOUNTER — Ambulatory Visit (INDEPENDENT_AMBULATORY_CARE_PROVIDER_SITE_OTHER): Payer: Medicare Other

## 2021-08-13 DIAGNOSIS — I5032 Chronic diastolic (congestive) heart failure: Secondary | ICD-10-CM

## 2021-08-13 DIAGNOSIS — E119 Type 2 diabetes mellitus without complications: Secondary | ICD-10-CM

## 2021-08-13 DIAGNOSIS — J989 Respiratory disorder, unspecified: Secondary | ICD-10-CM

## 2021-08-13 NOTE — Chronic Care Management (AMB) (Addendum)
Chronic Care Management   CCM RN Visit Note  08/13/2021 Name: Tanya Cole MRN: [id number] DOB: 01-08-1933  Subjective: Tanya Cole is a 87 y.o. year old female who is a primary care patient of Dale Piedmont, MD. The care management team was consulted for assistance with disease management and care coordination needs.    Engaged with patient by telephone for follow up visit in response to provider referral for case management and/or care coordination services.   Consent to Services:  The patient was given information about Chronic Care Management services, agreed to services, and gave verbal consent prior to initiation of services.  Please see initial visit note for detailed documentation.   Patient agreed to services and verbal consent obtained.   Assessment: Review of patient past medical history, allergies, medications, health status, including review of consultants reports, laboratory and other test data, was performed as part of comprehensive evaluation and provision of chronic care management services.   SDOH (Social Determinants of Health) assessments and interventions performed:    CCM Care Plan  Allergies  Allergen Reactions   Tramadol Itching and Nausea And Vomiting    Outpatient Encounter Medications as of 08/13/2021  Medication Sig   alendronate (FOSAMAX) 70 MG tablet Take 70 mg by mouth once a week.    aspirin 81 MG tablet Take 81 mg by mouth daily.   atorvastatin (LIPITOR) 80 MG tablet TAKE 1 TABLET BY MOUTH ONCE DAILY AT  6PM   fluticasone (FLONASE) 50 MCG/ACT nasal spray Use 2 spray(s) in each nostril once daily   fluticasone-salmeterol (ADVAIR) 250-50 MCG/ACT AEPB Inhale 1 puff into the lungs in the morning and at bedtime.   ipratropium-albuterol (DUONEB) 0.5-2.5 (3) MG/3ML SOLN Take 3 mLs by nebulization 2 (two) times daily.   metFORMIN (GLUCOPHAGE) 500 MG tablet TAKE 1 TABLET BY MOUTH TWICE DAILY WITH A MEAL   metoprolol tartrate (LOPRESSOR) 25  MG tablet Take 1 tablet by mouth twice daily   nitroGLYCERIN (NITROSTAT) 0.4 MG SL tablet Place 1 tablet (0.4 mg total) under the tongue every 5 (five) minutes as needed for chest pain.   PROAIR HFA 108 (90 Base) MCG/ACT inhaler Inhale 2 puffs into the lungs every 4 (four) hours as needed for wheezing or shortness of breath.   Vitamin D, Ergocalciferol, (DRISDOL) 50000 units CAPS capsule Take 50,000 Units by mouth once a week.   No facility-administered encounter medications on file as of 08/13/2021.    Patient Active Problem List   Diagnosis Date Noted   History of COVID-19 06/27/2021   COPD (chronic obstructive pulmonary disease) (HCC) 05/21/2021   Hypomagnesemia 05/14/2021   CAD (coronary artery disease)    Severe sepsis (HCC)    Hypokalemia    Acute respiratory failure with hypoxia (HCC)    Asthma exacerbation    Joint ache 04/09/2021   Calcified granuloma of lung (HCC) 03/02/2021   Acute exacerbation of chronic obstructive pulmonary disease (COPD) (HCC) 03/01/2021   COPD exacerbation (HCC) 11/29/2020   Chronic diastolic CHF (congestive heart failure) (HCC) 11/29/2020   CAP (community acquired pneumonia) 11/29/2020   Rib pain on right side 10/19/2020   Abnormal CXR 10/19/2020   Type 2 diabetes mellitus with cardiac complication (HCC) 10/08/2020   Elevated troponin 10/08/2020   Acute on chronic heart failure with preserved ejection fraction (HFpEF) (HCC) 10/08/2020   COPD with acute exacerbation (HCC) 08/19/2020   History of non-ST elevation myocardial infarction (NSTEMI) 08/07/2020   Asthma 08/07/2020   Leukocytosis 08/07/2020   Left  shoulder pain 07/12/2020   Iron deficiency 04/16/2020   Anemia 04/04/2020   SOB (shortness of breath) 03/25/2020   Cough 09/30/2019   Gallstone pancreatitis    Pre-op evaluation 08/30/2019   Cholecystitis 08/05/2019   Choledocholithiasis    Respiratory illness 06/18/2019   Coronary artery disease of native heart with stable angina pectoris  (HCC) 12/29/2018   Chest pain 10/07/2018   Memory change 05/27/2018   Osteoporosis 02/05/2018   Weight loss 06/24/2017   Abdominal pain, left lower quadrant 10/05/2016   Long term current use of systemic steroids 05/16/2016   Elevated erythrocyte sedimentation rate 05/04/2016   Back pain 04/29/2016   Fatigue 05/24/2015   Health care maintenance 01/25/2015   UTI (urinary tract infection) 07/07/2014   Neuropathy 03/24/2014   Diverticulitis 02/24/2013   Reactive airway disease 09/15/2012   Hypertension 09/14/2012   Hypercholesterolemia 09/14/2012   Diabetes mellitus with cardiac complication (HCC) 09/14/2012    Conditions to be addressed/monitored:CHF, COPD, and DMII  Care Plan : [facility] Care Plan  Updates made by Jodelle Gross, RN since 08/13/2021 12:00 AM     Problem: Patient with increase ER and hospitalizations related to shortness of breath COPD exacerbations   Priority: High     Long-Range Goal: Patient will report no emergency room visits within the next 90 days   Start Date: 06/04/2021  Expected End Date: 12/28/2021  Recent Progress: Not on track  Priority: High  Note:   Current Barriers:  Knowledge Deficits related to plan of care for management of CHF, COPD, and DMII  Chronic Disease Management support and education needs related to CHF, COPD, and DMII; Successful outreach to patient this morning.  She notes that her breathing is doing well, she has been using her nebulizer every morning and this seems to help her with breathing.  Patient plans on continuing to use her nebulizer every day since it is working for her.  RNCM Clinical Goal(s):  Patient will verbalize understanding of plan for management of CHF, COPD, and DMII verbalize basic understanding of CHF, COPD, and DMII disease process and self health management plan to manage conditions take all medications exactly as prescribed and will call provider for medication related questions-Reviewed medications and  patient reports she is taking as directed and she does not need any refills at this time. demonstrate a decrease in CHF, COPD, and DMII exacerbations requiring emergency room visits or hospitalizations experience decrease in ED visits. ED visits in last 6 months = 7 (2 admissions) through collaboration with RN Care manager, provider, and care team. Patient has not had any ED visits since August.  Interventions: 1:1 collaboration with primary care provider regarding development and update of comprehensive plan of care as evidenced by provider attestation and co-signature Inter-disciplinary care team collaboration (see longitudinal plan of care) Evaluation of current treatment plan related to  self management and patient's adherence to plan as established by provider;  COPD: (Status: Goal on track: NO.) Provided patient with basic written and verbal COPD education on self care/management/and exacerbation prevention; Advised patient to track and manage COPD triggers;  Provided instruction about proper use of medications used for management of COPD including inhalers; Advised patient to self assesses COPD action plan zone and make appointment with provider if in the yellow zone for 48 hours without improvement; Discussed the importance of adequate rest and management of fatigue with COPD; Encouraged patient to use nebulizer if rescue inhaler not helping shortness of breath Discussed contacting providers for shortness of breath or  increased need for rescue inhaler or nebulizer prior to needing to call EMS Discussed referral placed to Dr. Meredeth Ide for follow up appointment Encouraged to increase activity as tolerated- Patient reports that she and her daughter walk every day, about 1/2 mile. Work with home health therapy  Heart Failure Interventions:  (Status: Goal on track: NO.) Basic overview and discussion of pathophysiology of Heart Failure reviewed Provided education about placing scale on hard,  flat surface; Advised patient to weigh each morning after emptying bladder; Discussed importance of daily weight and advised patient to weigh and record daily; Per patient, her weight has been stable at 118 lbs, he last office visit patient weighed in at 122 lbs. Discussed the importance of keeping all appointments with provider; Discussed when to call provider based on weights and symptoms  Diabetes:  (Status: New goal. Condition stable. Not addressed this visit.) Lab Results  Component Value Date   HGBA1C 7.3 (H) 03/31/2021  Assessed patient's understanding of A1c goal: <7% Provided education to patient about basic DM disease process; Reviewed medications with patient and discussed importance of medication adherence;        Reviewed prescribed diet with patient low salt carb modified diabetic; Advised patient, providing education and rationale, to check cbg daily and record        call provider for findings outside established parameters;         Patient Goals/Self-Care Activities: Patient will self administer medications as prescribed Patient will attend all scheduled provider appointments Patient will continue to perform ADL's independently Develop a rescue plan & follow rescue plan if symptoms flare-up Eliminate symptom triggers at home Consider using nebulizer for increase in shortness of breath Contact provider for increase shortness of breath and increased need for rescue inhaler  Keep and attend follow-up appointments Dr. Meredeth Ide with Pulmonology should be contacting for follow up appointment Weigh self daily and keep in log for provider review Notify if you gain 2-3 pounds overnight of 5 pounds in 5 days Develop a rescue plan & follow rescue plan if symptoms flare-up Know when to call the doctor Low salt and low carbohydrate diabetic diet Check blood sugars at least daily, keeping log for provider review      Plan:Telephone follow up appointment with care management  team member scheduled for:  30-45 days.  Jodelle Gross, RN, BSN, CCM Care Management Coordinator [facility] [employer]: [phone] / Fax: [phone]

## 2021-08-13 NOTE — Patient Instructions (Signed)
Visit Information   Goals Addressed               This Visit's Progress     (RNCM)  Track and Manage My Symptoms-COPD        Timeframe:  Long-Range Goal Priority:  High Start Date:   06/04/21                          Expected End Date:   12/28/21                   Barriers: Knowledge  Follow Up Date 09/04/21    Develop a rescue plan & follow rescue plan if symptoms flare-up Eliminate symptom triggers at home Consider using nebulizer for increase in shortness of breath Contact provider for increase shortness of breath and increased need for rescue inhaler  Keep and attend follow-up appointments   Why is this important?   Tracking your symptoms and other information about your health helps your doctor plan your care.  Write down the symptoms, the time of day, what you were doing and what medicine you are taking.  You will soon learn how to manage your symptoms.     Notes:       (RNCM)  Track and Manage Symptoms-Heart Failure/DM        Timeframe:  Long-Range Goal Priority:  Medium Start Date:    06/04/21                         Expected End Date:   12/28/21                   Barriers: Knowledge  Follow Up Date 09/04/21    Weigh self daily and keep in log for provider review Notify if you gain 2-3 pounds overnight of 5 pounds in 5 days Develop a rescue plan & follow rescue plan if symptoms flare-up Know when to call the doctor Low salt and low carbohydrate diabetic diet Check blood sugars at least daily, keeping log for provider review     Why is this important?   You will be able to handle your symptoms better if you keep track of them.  Making some simple changes to your lifestyle will help.  Eating healthy is one thing you can do to take good care of yourself.    Notes:       Increase physical activity (pt-stated)        Stay active Note: Patient reports walking every day      COMPLETED: Track and Manage Fluids and Swelling-Heart Failure        Timeframe:   Long-Range Goal Priority:  Medium Start Date:  08/13/21                           Expected End Date:                       Follow Up Date 09/13/2021        Why is this important?   It is important to check your weight daily and watch how much salt and liquids you have.  It will help you to manage your heart failure.    Notes:         The patient verbalized understanding of instructions, educational materials, and care plan provided today and declined offer to receive copy of  patient instructions, educational materials, and care plan.   Telephone follow up appointment with care management team member scheduled for: 09/17/21@0900   Jodelle Gross, RN, BSN, CCM Care Management Coordinator [facility] Phone: [phone] / Fax: [phone]

## 2021-08-16 DIAGNOSIS — Z7984 Long term (current) use of oral hypoglycemic drugs: Secondary | ICD-10-CM | POA: Diagnosis not present

## 2021-08-16 DIAGNOSIS — J441 Chronic obstructive pulmonary disease with (acute) exacerbation: Secondary | ICD-10-CM | POA: Diagnosis not present

## 2021-08-16 DIAGNOSIS — M25562 Pain in left knee: Secondary | ICD-10-CM | POA: Diagnosis not present

## 2021-08-16 DIAGNOSIS — I110 Hypertensive heart disease with heart failure: Secondary | ICD-10-CM | POA: Diagnosis not present

## 2021-08-16 DIAGNOSIS — Z602 Problems related to living alone: Secondary | ICD-10-CM | POA: Diagnosis not present

## 2021-08-16 DIAGNOSIS — Z7952 Long term (current) use of systemic steroids: Secondary | ICD-10-CM | POA: Diagnosis not present

## 2021-08-16 DIAGNOSIS — E876 Hypokalemia: Secondary | ICD-10-CM | POA: Diagnosis not present

## 2021-08-16 DIAGNOSIS — I5032 Chronic diastolic (congestive) heart failure: Secondary | ICD-10-CM | POA: Diagnosis not present

## 2021-08-16 DIAGNOSIS — E7800 Pure hypercholesterolemia, unspecified: Secondary | ICD-10-CM | POA: Diagnosis not present

## 2021-08-16 DIAGNOSIS — Z7951 Long term (current) use of inhaled steroids: Secondary | ICD-10-CM | POA: Diagnosis not present

## 2021-08-16 DIAGNOSIS — R7402 Elevation of levels of lactic acid dehydrogenase (LDH): Secondary | ICD-10-CM | POA: Diagnosis not present

## 2021-08-16 DIAGNOSIS — E1165 Type 2 diabetes mellitus with hyperglycemia: Secondary | ICD-10-CM | POA: Diagnosis not present

## 2021-08-16 DIAGNOSIS — G9340 Encephalopathy, unspecified: Secondary | ICD-10-CM | POA: Diagnosis not present

## 2021-08-16 DIAGNOSIS — J9621 Acute and chronic respiratory failure with hypoxia: Secondary | ICD-10-CM | POA: Diagnosis not present

## 2021-08-16 DIAGNOSIS — E1159 Type 2 diabetes mellitus with other circulatory complications: Secondary | ICD-10-CM | POA: Diagnosis not present

## 2021-08-16 DIAGNOSIS — U071 COVID-19: Secondary | ICD-10-CM | POA: Diagnosis not present

## 2021-08-16 DIAGNOSIS — Z7982 Long term (current) use of aspirin: Secondary | ICD-10-CM | POA: Diagnosis not present

## 2021-08-16 DIAGNOSIS — M25561 Pain in right knee: Secondary | ICD-10-CM | POA: Diagnosis not present

## 2021-08-16 DIAGNOSIS — I2510 Atherosclerotic heart disease of native coronary artery without angina pectoris: Secondary | ICD-10-CM | POA: Diagnosis not present

## 2021-08-16 DIAGNOSIS — J45901 Unspecified asthma with (acute) exacerbation: Secondary | ICD-10-CM | POA: Diagnosis not present

## 2021-08-16 NOTE — Telephone Encounter (Signed)
Patient would like to hold on mammogram.

## 2021-08-17 DIAGNOSIS — G9340 Encephalopathy, unspecified: Secondary | ICD-10-CM | POA: Diagnosis not present

## 2021-08-17 DIAGNOSIS — J441 Chronic obstructive pulmonary disease with (acute) exacerbation: Secondary | ICD-10-CM | POA: Diagnosis not present

## 2021-08-17 DIAGNOSIS — E1159 Type 2 diabetes mellitus with other circulatory complications: Secondary | ICD-10-CM | POA: Diagnosis not present

## 2021-08-17 DIAGNOSIS — M25561 Pain in right knee: Secondary | ICD-10-CM | POA: Diagnosis not present

## 2021-08-17 DIAGNOSIS — I2510 Atherosclerotic heart disease of native coronary artery without angina pectoris: Secondary | ICD-10-CM | POA: Diagnosis not present

## 2021-08-17 DIAGNOSIS — E876 Hypokalemia: Secondary | ICD-10-CM | POA: Diagnosis not present

## 2021-08-17 DIAGNOSIS — E7800 Pure hypercholesterolemia, unspecified: Secondary | ICD-10-CM | POA: Diagnosis not present

## 2021-08-17 DIAGNOSIS — Z7982 Long term (current) use of aspirin: Secondary | ICD-10-CM | POA: Diagnosis not present

## 2021-08-17 DIAGNOSIS — R7402 Elevation of levels of lactic acid dehydrogenase (LDH): Secondary | ICD-10-CM | POA: Diagnosis not present

## 2021-08-17 DIAGNOSIS — Z602 Problems related to living alone: Secondary | ICD-10-CM | POA: Diagnosis not present

## 2021-08-17 DIAGNOSIS — Z7951 Long term (current) use of inhaled steroids: Secondary | ICD-10-CM | POA: Diagnosis not present

## 2021-08-17 DIAGNOSIS — I110 Hypertensive heart disease with heart failure: Secondary | ICD-10-CM | POA: Diagnosis not present

## 2021-08-17 DIAGNOSIS — E1165 Type 2 diabetes mellitus with hyperglycemia: Secondary | ICD-10-CM | POA: Diagnosis not present

## 2021-08-17 DIAGNOSIS — I5032 Chronic diastolic (congestive) heart failure: Secondary | ICD-10-CM | POA: Diagnosis not present

## 2021-08-17 DIAGNOSIS — J45901 Unspecified asthma with (acute) exacerbation: Secondary | ICD-10-CM | POA: Diagnosis not present

## 2021-08-17 DIAGNOSIS — J9621 Acute and chronic respiratory failure with hypoxia: Secondary | ICD-10-CM | POA: Diagnosis not present

## 2021-08-17 DIAGNOSIS — Z7984 Long term (current) use of oral hypoglycemic drugs: Secondary | ICD-10-CM | POA: Diagnosis not present

## 2021-08-17 DIAGNOSIS — U071 COVID-19: Secondary | ICD-10-CM | POA: Diagnosis not present

## 2021-08-17 DIAGNOSIS — Z7952 Long term (current) use of systemic steroids: Secondary | ICD-10-CM | POA: Diagnosis not present

## 2021-08-17 DIAGNOSIS — M25562 Pain in left knee: Secondary | ICD-10-CM | POA: Diagnosis not present

## 2021-08-18 DIAGNOSIS — E7800 Pure hypercholesterolemia, unspecified: Secondary | ICD-10-CM | POA: Diagnosis not present

## 2021-08-18 DIAGNOSIS — U071 COVID-19: Secondary | ICD-10-CM | POA: Diagnosis not present

## 2021-08-18 DIAGNOSIS — R7402 Elevation of levels of lactic acid dehydrogenase (LDH): Secondary | ICD-10-CM | POA: Diagnosis not present

## 2021-08-18 DIAGNOSIS — E876 Hypokalemia: Secondary | ICD-10-CM | POA: Diagnosis not present

## 2021-08-18 DIAGNOSIS — M25562 Pain in left knee: Secondary | ICD-10-CM | POA: Diagnosis not present

## 2021-08-18 DIAGNOSIS — I2510 Atherosclerotic heart disease of native coronary artery without angina pectoris: Secondary | ICD-10-CM | POA: Diagnosis not present

## 2021-08-18 DIAGNOSIS — Z7982 Long term (current) use of aspirin: Secondary | ICD-10-CM | POA: Diagnosis not present

## 2021-08-18 DIAGNOSIS — J45901 Unspecified asthma with (acute) exacerbation: Secondary | ICD-10-CM | POA: Diagnosis not present

## 2021-08-18 DIAGNOSIS — E1165 Type 2 diabetes mellitus with hyperglycemia: Secondary | ICD-10-CM | POA: Diagnosis not present

## 2021-08-18 DIAGNOSIS — Z602 Problems related to living alone: Secondary | ICD-10-CM | POA: Diagnosis not present

## 2021-08-18 DIAGNOSIS — Z7952 Long term (current) use of systemic steroids: Secondary | ICD-10-CM | POA: Diagnosis not present

## 2021-08-18 DIAGNOSIS — G9340 Encephalopathy, unspecified: Secondary | ICD-10-CM | POA: Diagnosis not present

## 2021-08-18 DIAGNOSIS — J9621 Acute and chronic respiratory failure with hypoxia: Secondary | ICD-10-CM | POA: Diagnosis not present

## 2021-08-18 DIAGNOSIS — Z7984 Long term (current) use of oral hypoglycemic drugs: Secondary | ICD-10-CM | POA: Diagnosis not present

## 2021-08-18 DIAGNOSIS — J441 Chronic obstructive pulmonary disease with (acute) exacerbation: Secondary | ICD-10-CM | POA: Diagnosis not present

## 2021-08-18 DIAGNOSIS — I5032 Chronic diastolic (congestive) heart failure: Secondary | ICD-10-CM | POA: Diagnosis not present

## 2021-08-18 DIAGNOSIS — E1159 Type 2 diabetes mellitus with other circulatory complications: Secondary | ICD-10-CM | POA: Diagnosis not present

## 2021-08-18 DIAGNOSIS — M25561 Pain in right knee: Secondary | ICD-10-CM | POA: Diagnosis not present

## 2021-08-18 DIAGNOSIS — I110 Hypertensive heart disease with heart failure: Secondary | ICD-10-CM | POA: Diagnosis not present

## 2021-08-18 DIAGNOSIS — Z7951 Long term (current) use of inhaled steroids: Secondary | ICD-10-CM | POA: Diagnosis not present

## 2021-08-25 DIAGNOSIS — Z7982 Long term (current) use of aspirin: Secondary | ICD-10-CM | POA: Diagnosis not present

## 2021-08-25 DIAGNOSIS — Z7952 Long term (current) use of systemic steroids: Secondary | ICD-10-CM | POA: Diagnosis not present

## 2021-08-25 DIAGNOSIS — I110 Hypertensive heart disease with heart failure: Secondary | ICD-10-CM | POA: Diagnosis not present

## 2021-08-25 DIAGNOSIS — Z7951 Long term (current) use of inhaled steroids: Secondary | ICD-10-CM | POA: Diagnosis not present

## 2021-08-25 DIAGNOSIS — G9340 Encephalopathy, unspecified: Secondary | ICD-10-CM | POA: Diagnosis not present

## 2021-08-25 DIAGNOSIS — M25562 Pain in left knee: Secondary | ICD-10-CM | POA: Diagnosis not present

## 2021-08-25 DIAGNOSIS — Z7984 Long term (current) use of oral hypoglycemic drugs: Secondary | ICD-10-CM | POA: Diagnosis not present

## 2021-08-25 DIAGNOSIS — E1165 Type 2 diabetes mellitus with hyperglycemia: Secondary | ICD-10-CM | POA: Diagnosis not present

## 2021-08-25 DIAGNOSIS — M25561 Pain in right knee: Secondary | ICD-10-CM | POA: Diagnosis not present

## 2021-08-25 DIAGNOSIS — E1159 Type 2 diabetes mellitus with other circulatory complications: Secondary | ICD-10-CM | POA: Diagnosis not present

## 2021-08-25 DIAGNOSIS — E876 Hypokalemia: Secondary | ICD-10-CM | POA: Diagnosis not present

## 2021-08-25 DIAGNOSIS — J9621 Acute and chronic respiratory failure with hypoxia: Secondary | ICD-10-CM | POA: Diagnosis not present

## 2021-08-25 DIAGNOSIS — Z602 Problems related to living alone: Secondary | ICD-10-CM | POA: Diagnosis not present

## 2021-08-25 DIAGNOSIS — J45901 Unspecified asthma with (acute) exacerbation: Secondary | ICD-10-CM | POA: Diagnosis not present

## 2021-08-25 DIAGNOSIS — I2510 Atherosclerotic heart disease of native coronary artery without angina pectoris: Secondary | ICD-10-CM | POA: Diagnosis not present

## 2021-08-25 DIAGNOSIS — J441 Chronic obstructive pulmonary disease with (acute) exacerbation: Secondary | ICD-10-CM | POA: Diagnosis not present

## 2021-08-25 DIAGNOSIS — E7800 Pure hypercholesterolemia, unspecified: Secondary | ICD-10-CM | POA: Diagnosis not present

## 2021-08-25 DIAGNOSIS — R7402 Elevation of levels of lactic acid dehydrogenase (LDH): Secondary | ICD-10-CM | POA: Diagnosis not present

## 2021-08-25 DIAGNOSIS — I5032 Chronic diastolic (congestive) heart failure: Secondary | ICD-10-CM | POA: Diagnosis not present

## 2021-08-25 DIAGNOSIS — U071 COVID-19: Secondary | ICD-10-CM | POA: Diagnosis not present

## 2021-08-30 DIAGNOSIS — E119 Type 2 diabetes mellitus without complications: Secondary | ICD-10-CM | POA: Diagnosis not present

## 2021-08-30 DIAGNOSIS — I5032 Chronic diastolic (congestive) heart failure: Secondary | ICD-10-CM

## 2021-09-01 DIAGNOSIS — M25562 Pain in left knee: Secondary | ICD-10-CM | POA: Diagnosis not present

## 2021-09-01 DIAGNOSIS — J9621 Acute and chronic respiratory failure with hypoxia: Secondary | ICD-10-CM | POA: Diagnosis not present

## 2021-09-01 DIAGNOSIS — U071 COVID-19: Secondary | ICD-10-CM | POA: Diagnosis not present

## 2021-09-01 DIAGNOSIS — R7402 Elevation of levels of lactic acid dehydrogenase (LDH): Secondary | ICD-10-CM | POA: Diagnosis not present

## 2021-09-01 DIAGNOSIS — E876 Hypokalemia: Secondary | ICD-10-CM | POA: Diagnosis not present

## 2021-09-01 DIAGNOSIS — Z7952 Long term (current) use of systemic steroids: Secondary | ICD-10-CM | POA: Diagnosis not present

## 2021-09-01 DIAGNOSIS — M25561 Pain in right knee: Secondary | ICD-10-CM | POA: Diagnosis not present

## 2021-09-01 DIAGNOSIS — J45901 Unspecified asthma with (acute) exacerbation: Secondary | ICD-10-CM | POA: Diagnosis not present

## 2021-09-01 DIAGNOSIS — I110 Hypertensive heart disease with heart failure: Secondary | ICD-10-CM | POA: Diagnosis not present

## 2021-09-01 DIAGNOSIS — E7800 Pure hypercholesterolemia, unspecified: Secondary | ICD-10-CM | POA: Diagnosis not present

## 2021-09-01 DIAGNOSIS — Z602 Problems related to living alone: Secondary | ICD-10-CM | POA: Diagnosis not present

## 2021-09-01 DIAGNOSIS — Z7984 Long term (current) use of oral hypoglycemic drugs: Secondary | ICD-10-CM | POA: Diagnosis not present

## 2021-09-01 DIAGNOSIS — Z7951 Long term (current) use of inhaled steroids: Secondary | ICD-10-CM | POA: Diagnosis not present

## 2021-09-01 DIAGNOSIS — I5032 Chronic diastolic (congestive) heart failure: Secondary | ICD-10-CM | POA: Diagnosis not present

## 2021-09-01 DIAGNOSIS — E1159 Type 2 diabetes mellitus with other circulatory complications: Secondary | ICD-10-CM | POA: Diagnosis not present

## 2021-09-01 DIAGNOSIS — Z7982 Long term (current) use of aspirin: Secondary | ICD-10-CM | POA: Diagnosis not present

## 2021-09-01 DIAGNOSIS — I2510 Atherosclerotic heart disease of native coronary artery without angina pectoris: Secondary | ICD-10-CM | POA: Diagnosis not present

## 2021-09-01 DIAGNOSIS — G9340 Encephalopathy, unspecified: Secondary | ICD-10-CM | POA: Diagnosis not present

## 2021-09-01 DIAGNOSIS — J441 Chronic obstructive pulmonary disease with (acute) exacerbation: Secondary | ICD-10-CM | POA: Diagnosis not present

## 2021-09-01 DIAGNOSIS — E1165 Type 2 diabetes mellitus with hyperglycemia: Secondary | ICD-10-CM | POA: Diagnosis not present

## 2021-09-02 DIAGNOSIS — Z7984 Long term (current) use of oral hypoglycemic drugs: Secondary | ICD-10-CM | POA: Diagnosis not present

## 2021-09-02 DIAGNOSIS — J441 Chronic obstructive pulmonary disease with (acute) exacerbation: Secondary | ICD-10-CM | POA: Diagnosis not present

## 2021-09-02 DIAGNOSIS — E1165 Type 2 diabetes mellitus with hyperglycemia: Secondary | ICD-10-CM | POA: Diagnosis not present

## 2021-09-02 DIAGNOSIS — R7402 Elevation of levels of lactic acid dehydrogenase (LDH): Secondary | ICD-10-CM | POA: Diagnosis not present

## 2021-09-02 DIAGNOSIS — M25561 Pain in right knee: Secondary | ICD-10-CM | POA: Diagnosis not present

## 2021-09-02 DIAGNOSIS — I5032 Chronic diastolic (congestive) heart failure: Secondary | ICD-10-CM | POA: Diagnosis not present

## 2021-09-02 DIAGNOSIS — Z7952 Long term (current) use of systemic steroids: Secondary | ICD-10-CM | POA: Diagnosis not present

## 2021-09-02 DIAGNOSIS — E1159 Type 2 diabetes mellitus with other circulatory complications: Secondary | ICD-10-CM | POA: Diagnosis not present

## 2021-09-02 DIAGNOSIS — Z7982 Long term (current) use of aspirin: Secondary | ICD-10-CM | POA: Diagnosis not present

## 2021-09-02 DIAGNOSIS — J9621 Acute and chronic respiratory failure with hypoxia: Secondary | ICD-10-CM | POA: Diagnosis not present

## 2021-09-02 DIAGNOSIS — G9340 Encephalopathy, unspecified: Secondary | ICD-10-CM | POA: Diagnosis not present

## 2021-09-02 DIAGNOSIS — Z602 Problems related to living alone: Secondary | ICD-10-CM | POA: Diagnosis not present

## 2021-09-02 DIAGNOSIS — J45901 Unspecified asthma with (acute) exacerbation: Secondary | ICD-10-CM | POA: Diagnosis not present

## 2021-09-02 DIAGNOSIS — I110 Hypertensive heart disease with heart failure: Secondary | ICD-10-CM | POA: Diagnosis not present

## 2021-09-02 DIAGNOSIS — M25562 Pain in left knee: Secondary | ICD-10-CM | POA: Diagnosis not present

## 2021-09-02 DIAGNOSIS — E876 Hypokalemia: Secondary | ICD-10-CM | POA: Diagnosis not present

## 2021-09-02 DIAGNOSIS — Z7951 Long term (current) use of inhaled steroids: Secondary | ICD-10-CM | POA: Diagnosis not present

## 2021-09-02 DIAGNOSIS — E7800 Pure hypercholesterolemia, unspecified: Secondary | ICD-10-CM | POA: Diagnosis not present

## 2021-09-02 DIAGNOSIS — I2510 Atherosclerotic heart disease of native coronary artery without angina pectoris: Secondary | ICD-10-CM | POA: Diagnosis not present

## 2021-09-02 DIAGNOSIS — U071 COVID-19: Secondary | ICD-10-CM | POA: Diagnosis not present

## 2021-09-07 DIAGNOSIS — J4520 Mild intermittent asthma, uncomplicated: Secondary | ICD-10-CM | POA: Diagnosis not present

## 2021-09-15 ENCOUNTER — Emergency Department
Admission: EM | Admit: 2021-09-15 | Discharge: 2021-09-15 | Disposition: A | Discharge status: Home / Self Care | Payer: Medicare Other | Attending: Emergency Medicine | Admitting: Emergency Medicine

## 2021-09-15 ENCOUNTER — Emergency Department: Payer: Medicare Other

## 2021-09-15 ENCOUNTER — Other Ambulatory Visit: Payer: Self-pay

## 2021-09-15 DIAGNOSIS — Z7982 Long term (current) use of aspirin: Secondary | ICD-10-CM | POA: Diagnosis not present

## 2021-09-15 DIAGNOSIS — J45909 Unspecified asthma, uncomplicated: Secondary | ICD-10-CM | POA: Diagnosis not present

## 2021-09-15 DIAGNOSIS — R0602 Shortness of breath: Secondary | ICD-10-CM | POA: Diagnosis not present

## 2021-09-15 DIAGNOSIS — Z20822 Contact with and (suspected) exposure to covid-19: Secondary | ICD-10-CM | POA: Insufficient documentation

## 2021-09-15 DIAGNOSIS — S199XXA Unspecified injury of neck, initial encounter: Secondary | ICD-10-CM | POA: Diagnosis present

## 2021-09-15 DIAGNOSIS — M62838 Other muscle spasm: Secondary | ICD-10-CM | POA: Diagnosis not present

## 2021-09-15 DIAGNOSIS — Z743 Need for continuous supervision: Secondary | ICD-10-CM | POA: Diagnosis not present

## 2021-09-15 DIAGNOSIS — I110 Hypertensive heart disease with heart failure: Secondary | ICD-10-CM | POA: Insufficient documentation

## 2021-09-15 DIAGNOSIS — I6529 Occlusion and stenosis of unspecified carotid artery: Secondary | ICD-10-CM | POA: Diagnosis not present

## 2021-09-15 DIAGNOSIS — I639 Cerebral infarction, unspecified: Secondary | ICD-10-CM | POA: Diagnosis not present

## 2021-09-15 DIAGNOSIS — M47812 Spondylosis without myelopathy or radiculopathy, cervical region: Secondary | ICD-10-CM | POA: Diagnosis not present

## 2021-09-15 DIAGNOSIS — J321 Chronic frontal sinusitis: Secondary | ICD-10-CM | POA: Diagnosis not present

## 2021-09-15 DIAGNOSIS — S161XXA Strain of muscle, fascia and tendon at neck level, initial encounter: Secondary | ICD-10-CM | POA: Insufficient documentation

## 2021-09-15 DIAGNOSIS — Z8673 Personal history of transient ischemic attack (TIA), and cerebral infarction without residual deficits: Secondary | ICD-10-CM | POA: Diagnosis not present

## 2021-09-15 DIAGNOSIS — E119 Type 2 diabetes mellitus without complications: Secondary | ICD-10-CM | POA: Insufficient documentation

## 2021-09-15 DIAGNOSIS — I10 Essential (primary) hypertension: Secondary | ICD-10-CM | POA: Diagnosis not present

## 2021-09-15 DIAGNOSIS — R519 Headache, unspecified: Secondary | ICD-10-CM | POA: Diagnosis not present

## 2021-09-15 DIAGNOSIS — Z79899 Other long term (current) drug therapy: Secondary | ICD-10-CM | POA: Diagnosis not present

## 2021-09-15 DIAGNOSIS — I5032 Chronic diastolic (congestive) heart failure: Secondary | ICD-10-CM | POA: Insufficient documentation

## 2021-09-15 DIAGNOSIS — X58XXXA Exposure to other specified factors, initial encounter: Secondary | ICD-10-CM | POA: Diagnosis not present

## 2021-09-15 DIAGNOSIS — R079 Chest pain, unspecified: Secondary | ICD-10-CM | POA: Diagnosis not present

## 2021-09-15 DIAGNOSIS — I2510 Atherosclerotic heart disease of native coronary artery without angina pectoris: Secondary | ICD-10-CM | POA: Diagnosis not present

## 2021-09-15 DIAGNOSIS — J449 Chronic obstructive pulmonary disease, unspecified: Secondary | ICD-10-CM | POA: Insufficient documentation

## 2021-09-15 DIAGNOSIS — R52 Pain, unspecified: Secondary | ICD-10-CM | POA: Diagnosis not present

## 2021-09-15 DIAGNOSIS — Z7984 Long term (current) use of oral hypoglycemic drugs: Secondary | ICD-10-CM | POA: Insufficient documentation

## 2021-09-15 DIAGNOSIS — M4312 Spondylolisthesis, cervical region: Secondary | ICD-10-CM | POA: Diagnosis not present

## 2021-09-15 DIAGNOSIS — R6889 Other general symptoms and signs: Secondary | ICD-10-CM | POA: Diagnosis not present

## 2021-09-15 DIAGNOSIS — M852 Hyperostosis of skull: Secondary | ICD-10-CM | POA: Diagnosis not present

## 2021-09-15 DIAGNOSIS — R0902 Hypoxemia: Secondary | ICD-10-CM | POA: Diagnosis not present

## 2021-09-15 LAB — COMPREHENSIVE METABOLIC PANEL
ALT: 11 U/L (ref 0–44)
AST: 14 U/L — ABNORMAL LOW (ref 15–41)
Albumin: 4.1 g/dL (ref 3.5–5.0)
Alkaline Phosphatase: 72 U/L (ref 38–126)
Anion gap: 10 (ref 5–15)
BUN: 13 mg/dL (ref 8–23)
CO2: 23 mmol/L (ref 22–32)
Calcium: 9.5 mg/dL (ref 8.9–10.3)
Chloride: 106 mmol/L (ref 98–111)
Creatinine, Ser: 0.71 mg/dL (ref 0.44–1.00)
GFR, Estimated: >60 mL/min (ref >60)
Glucose, Bld: 161 mg/dL — ABNORMAL HIGH (ref 70–99)
Potassium: 3.8 mmol/L (ref 3.5–5.1)
Sodium: 139 mmol/L (ref 135–145)
Total Bilirubin: 0.9 mg/dL (ref 0.3–1.2)
Total Protein: 7.5 g/dL (ref 6.5–8.1)

## 2021-09-15 LAB — CBC WITH DIFFERENTIAL/PLATELET
Abs Immature Granulocytes: 0.02 10*3/uL (ref 0.00–0.07)
Basophils Absolute: 0.1 10*3/uL (ref 0.0–0.1)
Basophils Relative: 1 %
Eosinophils Absolute: 0.9 10*3/uL — ABNORMAL HIGH (ref 0.0–0.5)
Eosinophils Relative: 8 %
HCT: 41.4 % (ref 36.0–46.0)
Hemoglobin: 13.8 g/dL (ref 12.0–15.0)
Immature Granulocytes: 0 %
Lymphocytes Relative: 18 %
Lymphs Abs: 1.8 10*3/uL (ref 0.7–4.0)
MCH: 28.3 pg (ref 26.0–34.0)
MCHC: 33.3 g/dL (ref 30.0–36.0)
MCV: 84.8 fL (ref 80.0–100.0)
Monocytes Absolute: 0.6 10*3/uL (ref 0.1–1.0)
Monocytes Relative: 6 %
Neutro Abs: 6.8 10*3/uL (ref 1.7–7.7)
Neutrophils Relative %: 67 %
Platelets: 269 10*3/uL (ref 150–400)
RBC: 4.88 MIL/uL (ref 3.87–5.11)
RDW: 13.3 % (ref 11.5–15.5)
WBC: 10.2 10*3/uL (ref 4.0–10.5)
nRBC: 0 % (ref 0.0–0.2)

## 2021-09-15 LAB — TROPONIN I (HIGH SENSITIVITY): Troponin I (High Sensitivity): 7 ng/L (ref <18)

## 2021-09-15 LAB — RESP PANEL BY RT-PCR (FLU A&B, COVID) ARPGX2
Influenza A by PCR: NEGATIVE
Influenza B by PCR: NEGATIVE
SARS Coronavirus 2 by RT PCR: NEGATIVE

## 2021-09-15 LAB — BRAIN NATRIURETIC PEPTIDE: B Natriuretic Peptide: 180.9 pg/mL — ABNORMAL HIGH (ref 0.0–100.0)

## 2021-09-15 MED ORDER — ALBUTEROL SULFATE HFA 108 (90 BASE) MCG/ACT IN AERS: 2.0000 | INHALATION_SPRAY | RESPIRATORY_TRACT | Status: DC | PRN

## 2021-09-15 MED ORDER — METOPROLOL TARTRATE 25 MG PO TABS
25.0000 mg | ORAL_TABLET | Freq: Two times a day (BID) | ORAL | Status: DC
  Administered 2021-09-15: 25 mg via ORAL
  Filled 2021-09-15: qty 1

## 2021-09-15 MED ORDER — ACETAMINOPHEN 500 MG PO TABS
1000.0000 mg | ORAL_TABLET | Freq: Once | ORAL | Status: AC
  Administered 2021-09-15: 1000 mg via ORAL
  Filled 2021-09-15: qty 2

## 2021-09-15 MED ORDER — NAPROXEN 500 MG PO TABS
500.0000 mg | ORAL_TABLET | Freq: Once | ORAL | Status: AC
  Administered 2021-09-15: 500 mg via ORAL
  Filled 2021-09-15: qty 1

## 2021-09-15 MED ORDER — LIDOCAINE 5 % EX PTCH
1.0000 | MEDICATED_PATCH | CUTANEOUS | Status: DC
Start: 2021-09-15 — End: 2021-09-15
  Administered 2021-09-15: 1 via TRANSDERMAL
  Filled 2021-09-15: qty 1

## 2021-09-15 NOTE — Discharge Instructions (Addendum)
Your Cts today showed: IMPRESSION: CT head:   1. No evidence of acute intracranial abnormality. 2. 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 3. Severe paranasal sinus disease, including complete opacification of frontal and ethmoid sinuses and multiple left frontal osteomas.  I suspect the sinusitis is likely chronic and not related to your acute symptoms today.   CT cervical spine:   1. No evidence of acute fracture or traumatic malalignment. 2. Moderate to severe degenerative change

## 2021-09-15 NOTE — ED Provider Notes (Signed)
[facility] Emergency Department Provider Note  ____________________________________________   Event Date/Time   First MD Initiated Contact with Patient 09/15/21 1047     (approximate)  I have reviewed the triage vital signs and the nursing notes.   HISTORY  Chief Complaint Generalized Body Aches   HPI Tanya Cole is a 87 y.o. female with a past medical history of CHF, CAD, asthma, DM, HTN, HDL, osteopenia and polymyalgia rheumatica who presents for assessment of some neck pain rating towards the right side of the upper back that seems that started yesterday evening.  Patient states she felt a little more short of breath today than usual but denies any cough or chest pain.  She denies any other headache, earache, sore throat, congestion, vision changes, chest pain, abdominal pain, nausea, vomiting, diarrhea, burning with urination, rash or extremity pain.  No recent falls or injuries.  She has not taken any analgesia prior to arrival.  No prior similar episodes.  No other acute concerns at this time.  Of note patient was hospitalized in September of this year for hypoxic respiratory secondary to COVID.  Patient has not been on oxygen for several weeks.         Past Medical History:  Diagnosis Date   (HFpEF) heart failure with preserved ejection fraction ([place])    a. TTE 12/19: EF 55-60%, probable HK of the mid apical anterior septal myocardium, Gr1DD, mild AI, mildly dilated LA; b.07/2020 Echo: EF 60-65%, no rwma, Gr2 DD. Nl RV fxn. Mildly dil LA. Mild MR.   Asthma    CAD (coronary artery disease)    a. 09/2018 NSTEMI/PCI: LM min irregs, mLAD 95 (PCI/DES), mLAD-2 60%, LCx mild diff dzs, RCA min irregs; b. 03/2020 MV: EF>65%, no ischemia/scar; c. 07/2020 Cath: LM min irregs, LAD 30p, 53m, 90d, D1/2 min irregs, LCX diff dzs throughout, OM1/2/3 mild dzs, RCA 30p, RPDA/RPAV min irrges. EF 55-65%.   CHF (congestive heart failure) ([place])    Diabetes mellitus  ([place])    Hypercholesterolemia    Hypertension    Myocardial infarction ([place])    Osteopenia    Palpitations    a. 04/2020 Z[employer]: Avg HR 75. 429 SVT episodes, longest 19 secs @ 133. Occas PACs (3.2%). Rare PVCs (<1%).   Polymyalgia rheumatica syndrome (HCC)    Reactive airway disease     Patient Active Problem List   Diagnosis Date Noted   History of COVID-19 06/27/2021   COPD (chronic obstructive pulmonary disease) ([place]) 05/21/2021   Hypomagnesemia 05/14/2021   CAD (coronary artery disease)    Severe sepsis (HCC)    Hypokalemia    Acute respiratory failure with hypoxia (HCC)    Asthma exacerbation    Joint ache 04/09/2021   Calcified granuloma of lung ([place]) 03/02/2021   Acute exacerbation of chronic obstructive pulmonary disease (COPD) ([place]) 03/01/2021   COPD exacerbation ([place]) 11/29/2020   Chronic diastolic CHF (congestive heart failure) ([place]) 11/29/2020   CAP (community acquired pneumonia) 11/29/2020   Rib pain on right side 10/19/2020   Abnormal CXR 10/19/2020   Type 2 diabetes mellitus with cardiac complication ([place]) [id number]   Elevated troponin 10/08/2020   Acute on chronic heart failure with preserved ejection fraction (HFpEF) ([place]) 10/08/2020   COPD with acute exacerbation ([place]) 08/19/2020   History of non-ST elevation myocardial infarction (NSTEMI) 08/07/2020   Asthma 08/07/2020   Leukocytosis 08/07/2020   Left shoulder pain 07/12/2020   Iron deficiency 04/16/2020   Anemia 04/04/2020   SOB (  shortness of breath) 03/25/2020   Cough 09/30/2019   Gallstone pancreatitis    Pre-op evaluation 08/30/2019   Cholecystitis 08/05/2019   Choledocholithiasis    Respiratory illness 06/18/2019   Coronary artery disease of native heart with stable angina pectoris ([place]) 12/29/2018   Chest pain 10/07/2018   Memory change 05/27/2018   Osteoporosis 02/05/2018   Weight loss 06/24/2017   Abdominal pain, left lower quadrant 10/05/2016   Long term current use of systemic steroids  05/16/2016   Elevated erythrocyte sedimentation rate 05/04/2016   Back pain 04/29/2016   Fatigue 05/24/2015   Health care maintenance 01/25/2015   UTI (urinary tract infection) 07/07/2014   Neuropathy 03/24/2014   Diverticulitis 02/24/2013   Reactive airway disease 09/15/2012   Hypertension 09/14/2012   Hypercholesterolemia 09/14/2012   Diabetes mellitus with cardiac complication ([place]) [id number]    Past Surgical History:  Procedure Laterality Date   ABDOMINAL HYSTERECTOMY  1981   prolapse and bleeding, ovaries not removed   BREAST EXCISIONAL BIOPSY Right    CHOLECYSTECTOMY N/A 09/02/2019   Procedure: LAPAROSCOPIC CHOLECYSTECTOMY WITH INTRAOPERATIVE CHOLANGIOGRAM;  Surgeon: Olean Ree, MD;  Location: [facility] ORS;  Service: General;  Laterality: N/A;   CORONARY STENT INTERVENTION N/A 10/08/2018   Procedure: CORONARY STENT INTERVENTION;  Surgeon: Wellington Hampshire, MD;  Location: [place] [facility];  Service: Cardiovascular;  Laterality: N/A;   ENDOSCOPIC RETROGRADE CHOLANGIOPANCREATOGRAPHY (ERCP) WITH PROPOFOL N/A 08/08/2019   Procedure: ENDOSCOPIC RETROGRADE CHOLANGIOPANCREATOGRAPHY (ERCP) WITH PROPOFOL;  Surgeon: Lucilla Lame, MD;  Location: [facility] ENDOSCOPY;  Service: Endoscopy;  Laterality: N/A;   LEFT HEART CATH AND CORONARY ANGIOGRAPHY N/A 10/08/2018   Procedure: LEFT HEART CATH AND CORONARY ANGIOGRAPHY;  Surgeon: Wellington Hampshire, MD;  Location: [place] [facility];  Service: Cardiovascular;  Laterality: N/A;   LEFT HEART CATH AND CORONARY ANGIOGRAPHY N/A 08/10/2020   Procedure: LEFT HEART CATH AND CORONARY ANGIOGRAPHY possible percutaneous intervention;  Surgeon: Wellington Hampshire, MD;  Location: [place] [facility];  Service: Cardiovascular;  Laterality: N/A;   UMBILICAL HERNIA REPAIR  7/94    Prior to Admission medications   Medication Sig Start Date End Date Taking? Authorizing Provider  alendronate (FOSAMAX) 70 MG tablet Take 70 mg by mouth once a week.  11/06/17   [provider]  aspirin 81 MG tablet Take 81 mg by mouth daily.    [provider]  atorvastatin (LIPITOR) 80 MG tablet TAKE 1 TABLET BY MOUTH ONCE DAILY AT  6PM 05/04/21   Loel Dubonnet, NP  fluticasone [facility]) 50 MCG/ACT nasal spray Use 2 spray(s) in each nostril once daily 08/03/20   Einar Pheasant, MD  fluticasone-salmeterol (ADVAIR) 250-50 MCG/ACT AEPB Inhale 1 puff into the lungs in the morning and at bedtime. 06/09/21   Einar Pheasant, MD  ipratropium-albuterol (DUONEB) 0.5-2.5 (3) MG/3ML SOLN Take 3 mLs by nebulization 2 (two) times daily. 05/17/21   Hosie Poisson, MD  metFORMIN (GLUCOPHAGE) 500 MG tablet TAKE 1 TABLET BY MOUTH TWICE DAILY WITH A MEAL 07/06/21   Einar Pheasant, MD  metoprolol tartrate (LOPRESSOR) 25 MG tablet Take 1 tablet by mouth twice daily 05/11/21   Wellington Hampshire, MD  nitroGLYCERIN (NITROSTAT) 0.4 MG SL tablet Place 1 tablet (0.4 mg total) under the tongue every 5 (five) minutes as needed for chest pain. 10/09/18   Bettey Costa, MD  PROAIR HFA 108 (90 Base) MCG/ACT inhaler Inhale 2 puffs into the lungs every 4 (four) hours as needed for wheezing or shortness of breath. 08/20/20  Lavina Hamman, MD  Vitamin D, Ergocalciferol, (DRISDOL) 50000 units CAPS capsule Take 50,000 Units by mouth once a week. 06/24/18   [provider]    Allergies Tramadol  Family History  Problem Relation Age of Onset   Arthritis Mother    Heart disease Mother    Heart attack Father    Throat cancer Sister    Parkinson's disease Sister    COPD Brother     Social History Social History   Tobacco Use   Smoking status: Never   Smokeless tobacco: Never  Vaping Use   Vaping Use: Never used  Substance Use Topics   Alcohol use: No    Alcohol/week: 0.0 standard drinks   Drug use: No    Review of Systems  Review of Systems  Constitutional:  Negative for chills and fever.  HENT:  Negative for sore throat.   Eyes:  Negative for pain.  Respiratory:   Positive for shortness of breath. Negative for cough and stridor.   Cardiovascular:  Negative for chest pain.  Gastrointestinal:  Negative for vomiting.  Genitourinary:  Negative for dysuria.  Musculoskeletal:  Positive for back pain, myalgias and neck pain.  Skin:  Negative for rash.  Neurological:  Negative for seizures, loss of consciousness and headaches.  Psychiatric/Behavioral:  Negative for suicidal ideas.   All other systems reviewed and are negative.    ____________________________________________   PHYSICAL EXAM:  VITAL SIGNS: ED Triage Vitals  Enc Vitals Group     BP 09/15/21 0854 (!) 171/84     Pulse Rate 09/15/21 0854 87     Resp 09/15/21 0854 20     Temp 09/15/21 0854 98.3 F (36.8 C)     Temp Source 09/15/21 0854 Oral     SpO2 09/15/21 0854 92 %     Weight 09/15/21 0854 118 lb (53.5 kg)     Height 09/15/21 0854 5\' 1"  (1.549 m)     Head Circumference --      Peak Flow --      Pain Score 09/15/21 0904 7     Pain Loc --      Pain Edu? --      Excl. in [place]? --    Vitals:   09/15/21 1142 09/15/21 1145  BP: (!) 146/67 (!) 146/67  Pulse: 81 85  Resp:  20  Temp:    SpO2:  98%   Physical Exam Vitals and nursing note reviewed.  Constitutional:      General: She is not in acute distress.    Appearance: She is well-developed.  HENT:     Head: Normocephalic and atraumatic.     Right Ear: External ear normal.     Left Ear: External ear normal.     Nose: Nose normal.  Eyes:     Conjunctiva/sclera: Conjunctivae normal.  Cardiovascular:     Rate and Rhythm: Normal rate and regular rhythm.     Heart sounds: No murmur heard. Pulmonary:     Effort: Pulmonary effort is normal. No respiratory distress.     Breath sounds: Normal breath sounds.  Abdominal:     Palpations: Abdomen is soft.     Tenderness: There is no abdominal tenderness.  Musculoskeletal:     Cervical back: Neck supple.  Skin:    General: Skin is warm and dry.     Capillary Refill: Capillary  refill takes less than 2 seconds.  Neurological:     Mental Status: She is alert and oriented to  person, place, and time.  Psychiatric:        Mood and Affect: Mood normal.     ____________________________________________   LABS (all labs ordered are listed, but only abnormal results are displayed)  Labs Reviewed  CBC WITH DIFFERENTIAL/PLATELET - Abnormal; Notable for the following components:      Result Value   Eosinophils Absolute 0.9 (*)    All other components within normal limits  COMPREHENSIVE METABOLIC PANEL - Abnormal; Notable for the following components:   Glucose, Bld 161 (*)    AST 14 (*)    All other components within normal limits  BRAIN NATRIURETIC PEPTIDE - Abnormal; Notable for the following components:   B Natriuretic Peptide 180.9 (*)    All other components within normal limits  RESP PANEL BY RT-PCR (FLU A&B, COVID) ARPGX2  URINALYSIS, COMPLETE (UACMP) WITH MICROSCOPIC  TROPONIN I (HIGH SENSITIVITY)   ____________________________________________  EKG  ECG shows sinus rhythm with a ventricular rate of 82 and some nonspecific ST changes versus artifact in V5 without other clear evidence of acute ischemia or significant arrhythmia. ____________________________________________  RADIOLOGY  ED MD interpretation: CT head shows no evidence of skull fracture, intracranial hemorrhage, acute ischemia or other acute process.  There is evidence of remote infarcts and severe sinus disease with some opacification of the frontal sinus.  CT C-spine shows some degenerative changes but no acute fracture or dislocation.  Chest x-ray shows evidence of some scarring and possible atelectasis fully worse than study done in September but no clear focal consolidation, pneumothorax, effusion, rib fracture or other clear acute thoracic process.  Official radiology report(s): DG Chest 2 View  Result Date: 09/15/2021 CLINICAL DATA:  Chest pain, shortness of breath EXAM: CHEST -  2 VIEW COMPARISON:  07/23/2021 FINDINGS: Cardiac size is within normal limits. Linear densities seen in the lower lung fields suggesting scarring or subsegmental atelectasis with interval worsening. There is no focal pulmonary consolidation. There is no pleural effusion or pneumothorax. IMPRESSION: There are no signs of pulmonary edema or focal pulmonary consolidation. There are small linear densities in both lower lung fields suggesting scarring and possibly subsegmental atelectasis. There is interval worsening of this finding in comparison with the study of 07/23/2021. Electronically Signed   By: Ernie Avena M.D.   On: 09/15/2021 11:42   CT HEAD WO CONTRAST ( )  Result Date: 09/15/2021 CLINICAL DATA:  Headache, intracranial hemorrhage suspected; Compression fracture, C-spine EXAM: CT HEAD WITHOUT CONTRAST CT CERVICAL SPINE WITHOUT CONTRAST TECHNIQUE: Multidetector CT imaging of the head and cervical spine was performed following the standard protocol without intravenous contrast. Multiplanar CT image reconstructions of the cervical spine were also generated. COMPARISON:  None. FINDINGS: CT HEAD FINDINGS Brain: Remote left cerebellar no evidence of acute infarction, hemorrhage, hydrocephalus, extra-axial collection or mass lesion/mass effect. Infarcts. Moderate patchy white matter hypoattenuation nonspecific but compatible with chronic microvascular ischemic disease. Probable small remote infarct in the right corona radiata. Vascular: Calcific intracranial atherosclerosis. No hyperdense vessel identified. Skull: No acute fracture.  Hyperostosis frontalis. Sinuses/Orbits: Completely opacified left frontal sinus with multiple osteomas. Complete opacification of bilateral frontal sinuses. Moderate mucosal thickening of bilateral inferior maxillary sinuses and mild bilateral sphenoid sinus mucosal thickening. Other: No mastoid effusions. CT CERVICAL SPINE FINDINGS Alignment: Slight anterolisthesis of C4  on C5, likely degenerative given facet arthropathy at this level. Otherwise, no substantial sagittal subluxation. Skull base and vertebrae: Vertebral body heights are maintained. No evidence of acute fracture diffuse osteopenia. Soft tissues and spinal canal: No prevertebral  fluid or swelling. No visible canal hematoma. Disc levels: Moderate to severe multilevel degenerative disc disease and facet arthropathy. Upper chest: Visualized lung apices are clear. Other: Carotid bifurcation atherosclerosis. IMPRESSION: CT head: 1. No evidence of acute intracranial abnormality. 2. 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 3. Severe paranasal sinus disease, including complete opacification of frontal and ethmoid sinuses and multiple left frontal osteomas. CT cervical spine: 1. No evidence of acute fracture or traumatic malalignment. 2. Moderate to severe degenerative change. Electronically Signed   By: Margaretha Sheffield M.D.   On: 09/15/2021 12:43   CT Cervical Spine Wo Contrast  Result Date: 09/15/2021 CLINICAL DATA:  Headache, intracranial hemorrhage suspected; Compression fracture, C-spine EXAM: CT HEAD WITHOUT CONTRAST CT CERVICAL SPINE WITHOUT CONTRAST TECHNIQUE: Multidetector CT imaging of the head and cervical spine was performed following the standard protocol without intravenous contrast. Multiplanar CT image reconstructions of the cervical spine were also generated. COMPARISON:  None. FINDINGS: CT HEAD FINDINGS Brain: Remote left cerebellar no evidence of acute infarction, hemorrhage, hydrocephalus, extra-axial collection or mass lesion/mass effect. Infarcts. Moderate patchy white matter hypoattenuation nonspecific but compatible with chronic microvascular ischemic disease. Probable small remote infarct in the right corona radiata. Vascular: Calcific intracranial atherosclerosis. No hyperdense vessel identified. Skull: No acute fracture.   Hyperostosis frontalis. Sinuses/Orbits: Completely opacified left frontal sinus with multiple osteomas. Complete opacification of bilateral frontal sinuses. Moderate mucosal thickening of bilateral inferior maxillary sinuses and mild bilateral sphenoid sinus mucosal thickening. Other: No mastoid effusions. CT CERVICAL SPINE FINDINGS Alignment: Slight anterolisthesis of C4 on C5, likely degenerative given facet arthropathy at this level. Otherwise, no substantial sagittal subluxation. Skull base and vertebrae: Vertebral body heights are maintained. No evidence of acute fracture diffuse osteopenia. Soft tissues and spinal canal: No prevertebral fluid or swelling. No visible canal hematoma. Disc levels: Moderate to severe multilevel degenerative disc disease and facet arthropathy. Upper chest: Visualized lung apices are clear. Other: Carotid bifurcation atherosclerosis. IMPRESSION: CT head: 1. No evidence of acute intracranial abnormality. 2. 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 3. Severe paranasal sinus disease, including complete opacification of frontal and ethmoid sinuses and multiple left frontal osteomas. CT cervical spine: 1. No evidence of acute fracture or traumatic malalignment. 2. Moderate to severe degenerative change. Electronically Signed   By: Margaretha Sheffield M.D.   On: 09/15/2021 12:43    ____________________________________________   PROCEDURES  Procedure(s) performed (including Critical Care):  Procedures   ____________________________________________   INITIAL IMPRESSION / ASSESSMENT AND PLAN / ED COURSE      Patient resents with above-stated history exam for assessment of some nontraumatic posterior headache and neck pain rating down towards the right shoulder a little bit of shortness of breath started last night.  On triage patient was noted to have an SPO2 of 92% after getting back to the room patient  noted to maintain SPO2 greater than 96% on room air over 3 hours.  Otherwise she is slight hypertensive with otherwise stable vital signs.  Does seem to have some tenderness and very tight bilateral trapezius muscles primarily at the insertion of the occiput.  There is no significant overlying skin changes.  Cranial nerves II through XII are grossly intact and patient has symmetric strength in her upper extremities.  Her back is otherwise unremarkable.  Lungs clear bilaterally.  Differential includes possible muscle spasm, pathologic fracture, atypical presentation for ACS, apical pneumonia or versus a spontaneous pneumothorax.  Overall I have a lower suspicion for PE or dissection at this time.  CT head shows no evidence of skull fracture, intracranial hemorrhage, acute ischemia or other acute process.  There is evidence of remote infarcts and severe sinus disease with some opacification of the frontal sinus.  I did discuss these incidental remote infarcts with patient and daughter recommendation to follow-up this with PCP to see if there is any DVT prevention steps that should be taken.  Do not believe patient has had an acute CVA.  CT C-spine shows some degenerative changes but no acute fracture or dislocation.  Chest x-ray shows evidence of some scarring and possible atelectasis fully worse than study done in September but no clear focal consolidation, pneumothorax, effusion, rib fracture or other clear acute thoracic process.  EKG and nonelevated troponin are not suggestive of ACS or myocarditis.  CMP shows no significant electrolyte or metabolic derangements.  CBC shows no leukocytosis or acute anemia.  BNP slightly elevated at 180 although patient overall does not appear significantly volume overloaded.  On reassessment patient states she is feeling much better.  She is ranging her neck much better.  I suspect most likely possible tendinitis or spasm at the insertion of the trapezius at the  occiput.  Given stable vitals with otherwise reassuring exam and work-up with patient stating she is feeling much better I think she is stable for discharge with outpatient follow-up.  Discharged in stable condition.  Strict return precautions advised and discussed.      ____________________________________________   FINAL CLINICAL IMPRESSION(S) / ED DIAGNOSES  Final diagnoses:  Nonintractable headache, unspecified chronicity pattern, unspecified headache type  Strain of neck muscle, initial encounter  Muscle spasm  Old cerebrovascular accident (CVA) without late effect    Medications  lidocaine (LIDODERM) 5 % 1 patch (1 patch Transdermal Patch Applied 09/15/21 1144)  metoprolol tartrate (LOPRESSOR) tablet 25 mg (25 mg Oral Given 09/15/21 1142)  albuterol (VENTOLIN HFA) 108 (90 Base) MCG/ACT inhaler 2 puff (has no administration in time range)  acetaminophen (TYLENOL) tablet 1,000 mg (1,000 mg Oral Given 09/15/21 1116)  naproxen (NAPROSYN) tablet 500 mg (500 mg Oral Given 09/15/21 1241)     ED Discharge Orders     None        Note:  This document was prepared using Dragon voice recognition software and may include unintentional dictation errors.    Lucrezia Starch, MD 09/15/21 1344

## 2021-09-15 NOTE — ED Triage Notes (Signed)
Pt comes into the ED via EMS from home with c/o generalized body aches since last night, denies any abd pain, N/V/D or fever.  "I just hurt all over".. denies a cough or congestion

## 2021-09-17 ENCOUNTER — Ambulatory Visit (INDEPENDENT_AMBULATORY_CARE_PROVIDER_SITE_OTHER): Payer: Medicare Other | Admitting: *Deleted

## 2021-09-17 DIAGNOSIS — J441 Chronic obstructive pulmonary disease with (acute) exacerbation: Secondary | ICD-10-CM

## 2021-09-17 DIAGNOSIS — I10 Essential (primary) hypertension: Secondary | ICD-10-CM

## 2021-09-17 DIAGNOSIS — I5032 Chronic diastolic (congestive) heart failure: Secondary | ICD-10-CM

## 2021-09-17 DIAGNOSIS — E1159 Type 2 diabetes mellitus with other circulatory complications: Secondary | ICD-10-CM

## 2021-09-17 NOTE — Chronic Care Management (AMB) (Signed)
Chronic Care Management   CCM RN Visit Note  09/17/2021 Name: Akeylah Lemond MRN: [id number] DOB: 23-Jun-1933  Subjective: Rue Krabbenhoft Trachtman is a 87 y.o. year old female who is a primary care patient of Einar Pheasant, MD. The care management team was consulted for assistance with disease management and care coordination needs.    Engaged with patient by telephone for follow up visit in response to provider referral for case management and/or care coordination services.   Consent to Services:  The patient was given information about Chronic Care Management services, agreed to services, and gave verbal consent prior to initiation of services.  Please see initial visit note for detailed documentation.   Patient agreed to services and verbal consent obtained.   Assessment: Review of patient past medical history, allergies, medications, health status, including review of consultants reports, laboratory and other test data, was performed as part of comprehensive evaluation and provision of chronic care management services.   SDOH (Social Determinants of Health) assessments and interventions performed:    CCM Care Plan  Allergies  Allergen Reactions   Tramadol Itching and Nausea And Vomiting    Outpatient Encounter Medications as of 09/17/2021  Medication Sig   ipratropium-albuterol (DUONEB) 0.5-2.5 (3) MG/3ML SOLN Take 3 mLs by nebulization 2 (two) times daily.   alendronate (FOSAMAX) 70 MG tablet Take 70 mg by mouth once a week.    aspirin 81 MG tablet Take 81 mg by mouth daily.   atorvastatin (LIPITOR) 80 MG tablet TAKE 1 TABLET BY MOUTH ONCE DAILY AT  6PM   fluticasone (FLONASE) 50 MCG/ACT nasal spray Use 2 spray(s) in each nostril once daily   fluticasone-salmeterol (ADVAIR) 250-50 MCG/ACT AEPB Inhale 1 puff into the lungs in the morning and at bedtime.   metFORMIN (GLUCOPHAGE) 500 MG tablet TAKE 1 TABLET BY MOUTH TWICE DAILY WITH A MEAL   metoprolol tartrate (LOPRESSOR) 25  MG tablet Take 1 tablet by mouth twice daily   nitroGLYCERIN (NITROSTAT) 0.4 MG SL tablet Place 1 tablet (0.4 mg total) under the tongue every 5 (five) minutes as needed for chest pain.   PROAIR HFA 108 (90 Base) MCG/ACT inhaler Inhale 2 puffs into the lungs every 4 (four) hours as needed for wheezing or shortness of breath.   Vitamin D, Ergocalciferol, (DRISDOL) 50000 units CAPS capsule Take 50,000 Units by mouth once a week.   No facility-administered encounter medications on file as of 09/17/2021.    Patient Active Problem List   Diagnosis Date Noted   History of COVID-19 06/27/2021   COPD (chronic obstructive pulmonary disease) ([place]) 05/21/2021   Hypomagnesemia 05/14/2021   CAD (coronary artery disease)    Severe sepsis (HCC)    Hypokalemia    Acute respiratory failure with hypoxia (HCC)    Asthma exacerbation    Joint ache 04/09/2021   Calcified granuloma of lung ([place]) 03/02/2021   Acute exacerbation of chronic obstructive pulmonary disease (COPD) ([place]) 03/01/2021   COPD exacerbation ([place]) 11/29/2020   Chronic diastolic CHF (congestive heart failure) ([place]) 11/29/2020   CAP (community acquired pneumonia) 11/29/2020   Rib pain on right side 10/19/2020   Abnormal CXR 10/19/2020   Type 2 diabetes mellitus with cardiac complication ([place]) [id number]   Elevated troponin 10/08/2020   Acute on chronic heart failure with preserved ejection fraction (HFpEF) ([place]) 10/08/2020   COPD with acute exacerbation ([place]) 08/19/2020   History of non-ST elevation myocardial infarction (NSTEMI) 08/07/2020   Asthma 08/07/2020   Leukocytosis 08/07/2020   Left  shoulder pain 07/12/2020   Iron deficiency 04/16/2020   Anemia 04/04/2020   SOB (shortness of breath) 03/25/2020   Cough 09/30/2019   Gallstone pancreatitis    Pre-op evaluation 08/30/2019   Cholecystitis 08/05/2019   Choledocholithiasis    Respiratory illness 06/18/2019   Coronary artery disease of native heart with stable angina pectoris  (HCC) 12/29/2018   Chest pain 10/07/2018   Memory change 05/27/2018   Osteoporosis 02/05/2018   Weight loss 06/24/2017   Abdominal pain, left lower quadrant 10/05/2016   Long term current use of systemic steroids 05/16/2016   Elevated erythrocyte sedimentation rate 05/04/2016   Back pain 04/29/2016   Fatigue 05/24/2015   Health care maintenance 01/25/2015   UTI (urinary tract infection) 07/07/2014   Neuropathy 03/24/2014   Diverticulitis 02/24/2013   Reactive airway disease 09/15/2012   Hypertension 09/14/2012   Hypercholesterolemia 09/14/2012   Diabetes mellitus with cardiac complication (HCC) 09/14/2012    Conditions to be addressed/monitored:CHF, COPD, and DMII  Care Plan : [facility] Care Plan  Updates made by Maple Mirza, RN since 09/17/2021 12:00 AM     Problem: Patient with increase ER and hospitalizations related to shortness of breath COPD exacerbations   Priority: High     Long-Range Goal: Patient will report no emergency room visits within the next 90 days   Start Date: 06/04/2021  Expected End Date: 12/28/2021  This Visit's Progress: On track  Recent Progress: Not on track  Priority: High  Note:   Current Barriers:  Knowledge Deficits related to plan of care for management of CHF, COPD, and DMII  Chronic Disease Management support and education needs related to CHF, COPD, and DMII; Patient reports she is doing better.  Had ER visit for shoulder/back pain, that has now resolved, but she is still wearing pain patch.  Encouraged to contact PCP for appointment.  She notes that her breathing is doing well, she continues to use her nebulizer every morning.  Denies any chest pain, edema and endorses shortness of breath only with exertion.  Weight is stable at 118 pounds.  Fasting blood sugar was 122.  Has attended her consultation appointment with Dr. Meredeth Ide  [facility] Clinical Goal(s):  Patient will verbalize understanding of plan for management of CHF, COPD, and  DMII verbalize basic understanding of CHF, COPD, and DMII disease process and self health management plan to manage conditions take all medications exactly as prescribed and will call provider for medication related questions demonstrate a decrease in CHF, COPD, and DMII exacerbations requiring emergency room visits or hospitalizations experience decrease in ED visits. ED visits in last 6 months = 7 (2 admissions) through collaboration with RN Care manager, provider, and care team.   Interventions: 1:1 collaboration with primary care provider regarding development and update of comprehensive plan of care as evidenced by provider attestation and co-signature Inter-disciplinary care team collaboration (see longitudinal plan of care) Evaluation of current treatment plan related to  self management and patient's adherence to plan as established by provider;  COPD: (Status: Goal on Track (progressing): YES.)  Long Term Goal Provided patient with basic written and verbal COPD education on self care/management/and exacerbation prevention; Advised patient to track and manage COPD triggers;  Provided instruction about proper use of medications used for management of COPD including inhalers; Advised patient to self assesses COPD action plan zone and make appointment with provider if in the yellow zone for 48 hours without improvement; Discussed the importance of adequate rest and management of fatigue with COPD;  Encouraged patient to use nebulizer if rescue inhaler not helping shortness of breath Discussed contacting providers for shortness of breath or increased need for rescue inhaler or nebulizer prior to needing to call EMS Discussed referral placed to Dr. Raul Del for follow up appointment Encouraged to increase activity as tolerated- Patient reports that she and her daughter continue to walk every day, about 1/2 mile.  Heart Failure Interventions:  (Status: Goal on Track (progressing): YES.)  Long  Term Goal Basic overview and discussion of pathophysiology of Heart Failure reviewed; Provided education on low sodium diet; Reviewed Heart Failure Action Plan in depth and provided written copy; Provided education about placing scale on hard, flat surface; Advised patient to weigh each morning after emptying bladder; Discussed importance of daily weight and advised patient to weigh and record daily; Discussed the importance of keeping all appointments with provider; Discussed when to call provider based on weights and symptoms  Diabetes:  (Status: Goal on Track (progressing): YES.)  Long Term Goal Lab Results  Component Value Date   HGBA1C 7.3 (H) 03/31/2021  Assessed patient's understanding of A1c goal: <7% Provided education to patient about basic DM disease process; Reviewed medications with patient and discussed importance of medication adherence;        Reviewed prescribed diet with patient low salt carb modified diabetic; Advised patient, providing education and rationale, to check cbg daily and record        call provider for findings outside established parameters;         Patient Goals/Self-Care Activities: Patient will self administer medications as prescribed Patient will attend all scheduled provider appointments Patient will continue to perform ADL's independently Develop a rescue plan & follow rescue plan if symptoms flare-up Eliminate symptom triggers at home Consider using nebulizer for increase in shortness of breath Contact provider for increase shortness of breath and increased need for rescue inhaler  Keep and attend follow-up appointments Weigh self daily and keep in log for provider review Notify if you gain 2-3 pounds overnight of 5 pounds in 5 days Develop a rescue plan & follow rescue plan if symptoms flare-up Know when to call the doctor Low salt and low carbohydrate diabetic diet Check blood sugars at least daily, keeping log for provider  review Schedule follow up ER appointment with PCP ASAP      Plan:The care management team will reach out to the patient again over the next 45 days.  Hubert Azure RN, MSN RN Care Management Coordinator [place] [phone] [email or web address]

## 2021-09-17 NOTE — Patient Instructions (Signed)
Visit Information  Thank you for taking time to visit with me today. Please don't hesitate to contact me if I can be of assistance to you before our next scheduled telephone appointment.  The care management team will reach out to the patient again over the next 45 days.   If you need to cancel or re-schedule our visit, please call [phone] and our care guide team will be happy to assist you.  Following is a list of the goals we discussed today:  Patient will self administer medications as prescribed Patient will attend all scheduled provider appointments Patient will continue to perform ADL's independently Develop a rescue plan & follow rescue plan if symptoms flare-up Eliminate symptom triggers at home Consider using nebulizer for increase in shortness of breath Contact provider for increase shortness of breath and increased need for rescue inhaler  Keep and attend follow-up appointments Weigh self daily and keep in log for provider review Notify if you gain 2-3 pounds overnight of 5 pounds in 5 days Develop a rescue plan & follow rescue plan if symptoms flare-up Know when to call the doctor Low salt and low carbohydrate diabetic diet Check blood sugars at least daily, keeping log for provider review Schedule follow up ER appointment with PCP ASAP  The patient verbalized understanding of instructions, educational materials, and care plan provided today and declined offer to receive copy of patient instructions, educational materials, and care plan.   Rhae Lerner RN, MSN RN Care Management Coordinator [facility] [phone] [email or web address]

## 2021-09-21 ENCOUNTER — Other Ambulatory Visit: Payer: Self-pay

## 2021-09-21 ENCOUNTER — Ambulatory Visit (INDEPENDENT_AMBULATORY_CARE_PROVIDER_SITE_OTHER): Payer: Medicare Other | Admitting: Cardiovascular Disease

## 2021-09-21 ENCOUNTER — Encounter: Payer: Self-pay | Admitting: Cardiovascular Disease

## 2021-09-21 VITALS — BP 160/70 | HR 71 | Ht 61.0 in | Wt 123.1 lb

## 2021-09-21 DIAGNOSIS — E785 Hyperlipidemia, unspecified: Secondary | ICD-10-CM | POA: Diagnosis not present

## 2021-09-21 DIAGNOSIS — I10 Essential (primary) hypertension: Secondary | ICD-10-CM

## 2021-09-21 DIAGNOSIS — R0989 Other specified symptoms and signs involving the circulatory and respiratory systems: Secondary | ICD-10-CM | POA: Diagnosis not present

## 2021-09-21 DIAGNOSIS — I471 Supraventricular tachycardia: Secondary | ICD-10-CM | POA: Diagnosis not present

## 2021-09-21 DIAGNOSIS — I2510 Atherosclerotic heart disease of native coronary artery without angina pectoris: Secondary | ICD-10-CM | POA: Diagnosis not present

## 2021-09-21 NOTE — Progress Notes (Signed)
Cardiology Office Note   Date:  09/22/2021   ID:  [name] [birth year], MRN [id number]  PCP:  Dale Crowell, MD  Cardiologist:   Lorine Bears, MD   Chief Complaint  Patient presents with   Other    3-4 month f/u pt would like to discuss her CT scan results. Meds reviewed verbally with pt.      History of Present Illness: Tanya Cole is a 87 y.o. female who presents for a follow-up visit regarding coronary artery disease. She has known history of coronary artery disease, COPD, diabetes, hypertension and hyperlipidemia.  She had non-ST elevation myocardial infarction in December 2019.  Cardiac catheterization showed 95% mid LAD stenosis and 60% distal LAD stenosis.  The mid LAD stenosis was treated successfully with PCI and drug-eluting stent placement.   She presented in October, 2021 with a small non-STEMI.  Cardiac catheterization was performed which showed significant one-vessel coronary artery disease with patent proximal LAD stent with mild in-stent restenosis, stable mid LAD stenosis at 60% and significant distal LAD stenosis at 90% close to the apex.  Ejection fraction was normal and LVEDP was mildly elevated.  Distal LAD stenosis was felt to be the culprit but was not amenable to PCI given distal location.   She has recurrent chest pain in the setting of short runs of atrial tachycardia and thus the dose of metoprolol was increased. She was hospitalized in August of this year with pleuritic chest pain in the setting of COVID-19 infection.  Echocardiogram while hospitalized with an EF of 55 to 60% with mild to moderate mitral regurgitation.  He went to the emergency room recently with posterior headache and right-sided neck pain.  CT scan of the neck and brain showed no acute abnormalities although there was evidence of old stroke on the right systolic.  Troponin was negative.  No recent chest pain, shortness of breath or palpitations.  Past Medical  History:  Diagnosis Date   (HFpEF) heart failure with preserved ejection fraction (HCC)    a. TTE 12/19: EF 55-60%, probable HK of the mid apical anterior septal myocardium, Gr1DD, mild AI, mildly dilated LA; b.07/2020 Echo: EF 60-65%, no rwma, Gr2 DD. Nl RV fxn. Mildly dil LA. Mild MR.   Asthma    CAD (coronary artery disease)    a. 09/2018 NSTEMI/PCI: LM min irregs, mLAD 95 (PCI/DES), mLAD-2 60%, LCx mild diff dzs, RCA min irregs; b. 03/2020 MV: EF>65%, no ischemia/scar; c. 07/2020 Cath: LM min irregs, LAD 30p, 14m, 90d, D1/2 min irregs, LCX diff dzs throughout, OM1/2/3 mild dzs, RCA 30p, RPDA/RPAV min irrges. EF 55-65%.   CHF (congestive heart failure) (HCC)    Diabetes mellitus (HCC)    Hypercholesterolemia    Hypertension    Myocardial infarction (HCC)    Osteopenia    Palpitations    a. 04/2020 Zio: Avg HR 75. 429 SVT episodes, longest 19 secs @ 133. Occas PACs (3.2%). Rare PVCs (<1%).   Polymyalgia rheumatica syndrome (HCC)    Reactive airway disease     Past Surgical History:  Procedure Laterality Date   ABDOMINAL HYSTERECTOMY  1981   prolapse and bleeding, ovaries not removed   BREAST EXCISIONAL BIOPSY Right    CHOLECYSTECTOMY N/A 09/02/2019   Procedure: LAPAROSCOPIC CHOLECYSTECTOMY WITH INTRAOPERATIVE CHOLANGIOGRAM;  Surgeon: Henrene Dodge, MD;  Location: [facility] ORS;  Service: General;  Laterality: N/A;   CORONARY STENT INTERVENTION N/A 10/08/2018   Procedure: CORONARY STENT INTERVENTION;  Surgeon: Iran Ouch,  MD;  Location: [place] [facility];  Service: Cardiovascular;  Laterality: N/A;   ENDOSCOPIC RETROGRADE CHOLANGIOPANCREATOGRAPHY (ERCP) WITH PROPOFOL N/A 08/08/2019   Procedure: ENDOSCOPIC RETROGRADE CHOLANGIOPANCREATOGRAPHY (ERCP) WITH PROPOFOL;  Surgeon: Lucilla Lame, MD;  Location: [facility] ENDOSCOPY;  Service: Endoscopy;  Laterality: N/A;   LEFT HEART CATH AND CORONARY ANGIOGRAPHY N/A 10/08/2018   Procedure: LEFT HEART CATH AND CORONARY ANGIOGRAPHY;  Surgeon: Wellington Hampshire, MD;  Location: [place] [facility];  Service: Cardiovascular;  Laterality: N/A;   LEFT HEART CATH AND CORONARY ANGIOGRAPHY N/A 08/10/2020   Procedure: LEFT HEART CATH AND CORONARY ANGIOGRAPHY possible percutaneous intervention;  Surgeon: Wellington Hampshire, MD;  Location: [place] [facility];  Service: Cardiovascular;  Laterality: N/A;   UMBILICAL HERNIA REPAIR  7/94     Current Outpatient Medications  Medication Sig Dispense Refill   alendronate (FOSAMAX) 70 MG tablet Take 70 mg by mouth once a week.      aspirin 81 MG tablet Take 81 mg by mouth daily.     atorvastatin (LIPITOR) 80 MG tablet TAKE 1 TABLET BY MOUTH ONCE DAILY AT  6PM 90 tablet 3   fluticasone (FLONASE) 50 MCG/ACT nasal spray Use 2 spray(s) in each nostril once daily 16 g 0   fluticasone-salmeterol (ADVAIR) 250-50 MCG/ACT AEPB Inhale 1 puff into the lungs in the morning and at bedtime. 1 each 5   ipratropium-albuterol (DUONEB) 0.5-2.5 (3) MG/3ML SOLN Take 3 mLs by nebulization 2 (two) times daily. 360 mL 7   metFORMIN (GLUCOPHAGE) 500 MG tablet TAKE 1 TABLET BY MOUTH TWICE DAILY WITH A MEAL 180 tablet 0   metoprolol tartrate (LOPRESSOR) 25 MG tablet Take 1 tablet by mouth twice daily 180 tablet 1   nitroGLYCERIN (NITROSTAT) 0.4 MG SL tablet Place 1 tablet (0.4 mg total) under the tongue every 5 (five) minutes as needed for chest pain. 30 tablet 12   PROAIR HFA 108 (90 Base) MCG/ACT inhaler Inhale 2 puffs into the lungs every 4 (four) hours as needed for wheezing or shortness of breath. 18 g 0   Vitamin D, Ergocalciferol, (DRISDOL) 50000 units CAPS capsule Take 50,000 Units by mouth once a week.  3   No current facility-administered medications for this visit.    Allergies:   Tramadol    Social History:  The patient  reports that she has never smoked. She has never used smokeless tobacco. She reports that she does not drink alcohol and does not use drugs.   Family History:  The patient's family history  includes Arthritis in her mother; COPD in her brother; Heart attack in her father; Heart disease in her mother; Parkinson's disease in her sister; Throat cancer in her sister.    ROS:  Please see the history of present illness.   Otherwise, review of systems are positive for none.   All other systems are reviewed and negative.    PHYSICAL EXAM: VS:  BP (!) 160/70 (BP Location: Right Arm, Patient Position: Sitting, Cuff Size: Normal)   Pulse 71   Ht 5\' 1"  (1.549 m)   Wt 123 lb 2 oz (55.8 kg)   SpO2 97%   BMI 23.26 kg/m  , BMI Body mass index is 23.26 kg/m. GEN: Well nourished, well developed, in no acute distress  HEENT: normal  Neck: no JVD or masses.  Faint right carotid bruit Cardiac: RRR; no rubs, or gallops,no edema .  1 out of 6 systolic murmur in the aortic area. Respiratory:  clear to auscultation bilaterally,  normal work of breathing GI: soft, nontender, nondistended, + BS MS: no deformity or atrophy  Skin: warm and dry, no rash Neuro:  Strength and sensation are intact Psych: euthymic mood, full affect Right radial pulses normal with no hematoma.  EKG:  EKG is not ordered today.    Recent Labs: 06/30/2021: Magnesium 1.7 07/23/2021: TSH 2.50 09/15/2021: ALT 11; B Natriuretic Peptide 180.9; BUN 13; Creatinine, Ser 0.71; Hemoglobin 13.8; Platelets 269; Potassium 3.8; Sodium 139    Lipid Panel    Component Value Date/Time   CHOL 175 06/27/2021 0411   TRIG 113 06/27/2021 0411   HDL 44 06/27/2021 0411   CHOLHDL 4.0 06/27/2021 0411   VLDL 23 06/27/2021 0411   LDLCALC 108 (H) 06/27/2021 0411   LDLDIRECT 136.0 07/03/2020 1139      Wt Readings from Last 3 Encounters:  09/21/21 123 lb 2 oz (55.8 kg)  09/15/21 118 lb (53.5 kg)  07/23/21 122 lb (55.3 kg)     No flowsheet data found.    ASSESSMENT AND PLAN:  1.  Coronary artery disease involving native coronary arteries without angina: She is doing well at the present time with no anginal symptoms.  Continue  medical therapy.    2.  Essential hypertension: Blood pressure is elevated today but usually its more controlled.  Continue metoprolol 25 mg twice daily.  She used to be on lisinopril at some point.  Continue to monitor blood pressure and consider resuming lisinopril if blood pressure remains elevated.  3.  Hyperlipidemia: Her previous LDL was 37.  However, recent lipid profile showed an LDL of 108 although there has been no change in the dose of atorvastatin 80 mg daily.  She does admit to missing taking the medication sometimes.  I discussed with her the importance of compliance.  4.  Atrial tachycardia: Symptoms are well controlled on metoprolol.  5.  Right carotid bruit with CT evidence of old right-sided stroke.  I requested carotid Doppler.   Disposition:   FU with me in 6 months  Signed,  Lorine Bears, MD  09/22/2021 5:50 PM    [facility]

## 2021-09-21 NOTE — Patient Instructions (Signed)
Medication Instructions:  Your physician recommends that you continue on your current medications as directed. Please refer to the Current Medication list given to you today.  *If you need a refill on your cardiac medications before your next appointment, please call your pharmacy*   Lab Work: None ordered If you have labs (blood work) drawn today and your tests are completely normal, you will receive your results only by: MyChart Message (if you have MyChart) OR A paper copy in the mail If you have any lab test that is abnormal or we need to change your treatment, we will call you to review the results.   Testing/Procedures: Your physician has requested that you have a carotid duplex. This test is an ultrasound of the carotid arteries in your neck. It looks at blood flow through these arteries that supply the brain with blood. Allow one hour for this exam. There are no restrictions or special instructions.    Follow-Up: At [facility], you and your health needs are our priority.  As part of our continuing mission to provide you with exceptional heart care, we have created designated Provider Care Teams.  These Care Teams include your primary Cardiologist (physician) and Advanced Practice Providers (APPs -  Physician Assistants and Nurse Practitioners) who all work together to provide you with the care you need, when you need it.  We recommend signing up for the patient portal called "MyChart".  Sign up information is provided on this After Visit Summary.  MyChart is used to connect with patients for Virtual Visits (Telemedicine).  Patients are able to view lab/test results, encounter notes, upcoming appointments, etc.  Non-urgent messages can be sent to your provider as well.   To learn more about what you can do with MyChart, go to [email or web address].    Your next appointment:   6 month(s)  The format for your next appointment:   In Person  Provider:   You may see Lorine Bears, MD or one of the following Advanced Practice Providers on your designated Care Team:   Nicolasa Ducking, NP Eula Listen, PA-C Cadence Fransico Michael, PA-C  :1}    Other Instructions N/A

## 2021-09-29 DIAGNOSIS — J441 Chronic obstructive pulmonary disease with (acute) exacerbation: Secondary | ICD-10-CM | POA: Diagnosis not present

## 2021-09-29 DIAGNOSIS — E1159 Type 2 diabetes mellitus with other circulatory complications: Secondary | ICD-10-CM | POA: Diagnosis not present

## 2021-09-29 DIAGNOSIS — I5032 Chronic diastolic (congestive) heart failure: Secondary | ICD-10-CM

## 2021-09-29 DIAGNOSIS — I10 Essential (primary) hypertension: Secondary | ICD-10-CM | POA: Diagnosis not present

## 2021-10-01 ENCOUNTER — Other Ambulatory Visit: Payer: Self-pay | Admitting: Internal Medicine

## 2021-10-22 ENCOUNTER — Telehealth: Payer: Self-pay | Admitting: Internal Medicine

## 2021-10-22 ENCOUNTER — Ambulatory Visit (INDEPENDENT_AMBULATORY_CARE_PROVIDER_SITE_OTHER): Payer: Medicare Other | Admitting: *Deleted

## 2021-10-22 DIAGNOSIS — I10 Essential (primary) hypertension: Secondary | ICD-10-CM

## 2021-10-22 DIAGNOSIS — E1159 Type 2 diabetes mellitus with other circulatory complications: Secondary | ICD-10-CM

## 2021-10-22 DIAGNOSIS — I5032 Chronic diastolic (congestive) heart failure: Secondary | ICD-10-CM

## 2021-10-22 NOTE — Telephone Encounter (Signed)
Received call from CCM visit that she fell today.  Please call and confirm doing ok. Confirm no head injury, etc.  If any acute issues, needs to be evaluated

## 2021-10-22 NOTE — Telephone Encounter (Signed)
Confirmed patient is ok. No head injury, no acute pain. Patient says she fell getting out of bed. Will be evaluated if anything acute.

## 2021-10-22 NOTE — Patient Instructions (Signed)
Visit Information  Thank you for taking time to visit with me today. Please don't hesitate to contact me if I can be of assistance to you before our next scheduled telephone appointment.  Following are the goals we discussed today:  Patient will self administer medications as prescribed Patient will attend all scheduled provider appointments Patient will continue to perform ADL's independently Develop a rescue plan & follow rescue plan if symptoms flare-up Eliminate symptom triggers at home Consider using nebulizer for increase in shortness of breath Contact provider for increase shortness of breath and increased need for rescue inhaler  Keep and attend follow-up appointments Weigh self daily and keep in log for provider review Notify if you gain 2-3 pounds overnight of 5 pounds in 5 days Develop a rescue plan & follow rescue plan if symptoms flare-up Know when to call the doctor Low salt and low carbohydrate diabetic diet Check blood sugars at least daily, keeping log for provider review Contact PCP for follow from fall this morning  Our next appointment is by telephone on 11/12/21 at 1000  Please call the care guide team at [phone] if you need to cancel or reschedule your appointment.   If you are experiencing a Mental Health or Behavioral Health Crisis or need someone to talk to, please call the Suicide and Crisis Lifeline: 988 call the [place] National Suicide Prevention Lifeline: 306 733 8734 or TTY: 225 795 0883 TTY (218) 820-2248) to talk to a trained counselor call 1-800-273-TALK (toll free, 24 hour hotline) call 911   The patient verbalized understanding of instructions, educational materials, and care plan provided today and declined offer to receive copy of patient instructions, educational materials, and care plan.   Rhae Lerner RN, MSN RN Care Management Coordinator [facility] [phone] [email or web address]

## 2021-10-22 NOTE — Chronic Care Management (AMB) (Signed)
Chronic Care Management   CCM RN Visit Note  10/22/2021 Name: Tanya Cole MRN: [id number] DOB: 03/26/1933  Subjective: Tanya Cole is a 87 y.o. year old female who is a primary care patient of Einar Pheasant, MD. The care management team was consulted for assistance with disease management and care coordination needs.    Engaged with patient by telephone for follow up visit in response to provider referral for case management and/or care coordination services.   Consent to Services:  The patient was given information about Chronic Care Management services, agreed to services, and gave verbal consent prior to initiation of services.  Please see initial visit note for detailed documentation.   Patient agreed to services and verbal consent obtained.   Assessment: Review of patient past medical history, allergies, medications, health status, including review of consultants reports, laboratory and other test data, was performed as part of comprehensive evaluation and provision of chronic care management services.   SDOH (Social Determinants of Health) assessments and interventions performed:    CCM Care Plan  Allergies  Allergen Reactions   Tramadol Itching and Nausea And Vomiting    Outpatient Encounter Medications as of 10/22/2021  Medication Sig   alendronate (FOSAMAX) 70 MG tablet Take 70 mg by mouth once a week.    aspirin 81 MG tablet Take 81 mg by mouth daily.   atorvastatin (LIPITOR) 80 MG tablet TAKE 1 TABLET BY MOUTH ONCE DAILY AT  6PM   fluticasone (FLONASE) 50 MCG/ACT nasal spray Use 2 spray(s) in each nostril once daily   fluticasone-salmeterol (ADVAIR) 250-50 MCG/ACT AEPB Inhale 1 puff into the lungs in the morning and at bedtime.   ipratropium-albuterol (DUONEB) 0.5-2.5 (3) MG/3ML SOLN Take 3 mLs by nebulization 2 (two) times daily.   metFORMIN (GLUCOPHAGE) 500 MG tablet TAKE 1 TABLET BY MOUTH TWICE DAILY WITH A MEAL   metoprolol tartrate (LOPRESSOR) 25  MG tablet Take 1 tablet by mouth twice daily   nitroGLYCERIN (NITROSTAT) 0.4 MG SL tablet Place 1 tablet (0.4 mg total) under the tongue every 5 (five) minutes as needed for chest pain.   PROAIR HFA 108 (90 Base) MCG/ACT inhaler Inhale 2 puffs into the lungs every 4 (four) hours as needed for wheezing or shortness of breath.   Vitamin D, Ergocalciferol, (DRISDOL) 50000 units CAPS capsule Take 50,000 Units by mouth once a week.   No facility-administered encounter medications on file as of 10/22/2021.    Patient Active Problem List   Diagnosis Date Noted   History of COVID-19 06/27/2021   COPD (chronic obstructive pulmonary disease) ([place]) 05/21/2021   Hypomagnesemia 05/14/2021   CAD (coronary artery disease)    Severe sepsis (HCC)    Hypokalemia    Acute respiratory failure with hypoxia (HCC)    Asthma exacerbation    Joint ache 04/09/2021   Calcified granuloma of lung ([place]) 03/02/2021   Acute exacerbation of chronic obstructive pulmonary disease (COPD) ([place]) 03/01/2021   COPD exacerbation ([place]) 11/29/2020   Chronic diastolic CHF (congestive heart failure) ([place]) 11/29/2020   CAP (community acquired pneumonia) 11/29/2020   Rib pain on right side 10/19/2020   Abnormal CXR 10/19/2020   Type 2 diabetes mellitus with cardiac complication ([place]) [id number]   Elevated troponin 10/08/2020   Acute on chronic heart failure with preserved ejection fraction (HFpEF) ([place]) 10/08/2020   COPD with acute exacerbation ([place]) 08/19/2020   History of non-ST elevation myocardial infarction (NSTEMI) 08/07/2020   Asthma 08/07/2020   Leukocytosis 08/07/2020   Left  shoulder pain 07/12/2020   Iron deficiency 04/16/2020   Anemia 04/04/2020   SOB (shortness of breath) 03/25/2020   Cough 09/30/2019   Gallstone pancreatitis    Pre-op evaluation 08/30/2019   Cholecystitis 08/05/2019   Choledocholithiasis    Respiratory illness 06/18/2019   Coronary artery disease of native heart with stable angina pectoris  ([place]) 12/29/2018   Chest pain 10/07/2018   Memory change 05/27/2018   Osteoporosis 02/05/2018   Weight loss 06/24/2017   Abdominal pain, left lower quadrant 10/05/2016   Long term current use of systemic steroids 05/16/2016   Elevated erythrocyte sedimentation rate 05/04/2016   Back pain 04/29/2016   Fatigue 05/24/2015   Health care maintenance 01/25/2015   UTI (urinary tract infection) 07/07/2014   Neuropathy 03/24/2014   Diverticulitis 02/24/2013   Reactive airway disease 09/15/2012   Hypertension 09/14/2012   Hypercholesterolemia 09/14/2012   Diabetes mellitus with cardiac complication ([place]) 99991111    Conditions to be addressed/monitored:CHF, HTN, and DMII  Care Plan : [facility] Care Plan  Updates made by Leona Singleton, RN since 10/22/2021 12:00 AM     Problem: Patient with increase ER and hospitalizations related to shortness of breath COPD exacerbations   Priority: High     Long-Range Goal: Patient will report no emergency room visits within the next 90 days   Start Date: 06/04/2021  Expected End Date: 12/28/2021  This Visit's Progress: Not on track  Recent Progress: On track  Priority: High  Note:   Current Barriers:  Knowledge Deficits related to plan of care for management of CHF, COPD, and DMII  Chronic Disease Management support and education needs related to CHF, COPD, and DMII; Patient reports she fell this morning getting out of the bed.  Did not hurt herself.  Encouraged to contact provider.  Denies any light headiness or dizziness and was able to crawl to living room and pull herself up.  Does have life alert (did not use).  Has not checked vitals in a while; blood sugar yesterday was around 122.  Has not used nebulizer in the last few weeks RNCM Clinical Goal(s):  Patient will verbalize understanding of plan for management of CHF, COPD, and DMII verbalize basic understanding of CHF, COPD, and DMII disease process and self health management plan to manage  conditions take all medications exactly as prescribed and will call provider for medication related questions demonstrate a decrease in CHF, COPD, and DMII exacerbations requiring emergency room visits or hospitalizations experience decrease in ED visits. ED visits in last 6 months = 7 (2 admissions) through collaboration with RN Care manager, provider, and care team.   Interventions: 1:1 collaboration with primary care provider regarding development and update of comprehensive plan of care as evidenced by provider attestation and co-signature Inter-disciplinary care team collaboration (see longitudinal plan of care) Evaluation of current treatment plan related to  self management and patient's adherence to plan as established by provider; Discussed referral placed to Dr. Raul Del for follow up appointment COPD: (Status: Goal on Track (progressing): YES.)  Long Term Goal Provided patient with basic written and verbal COPD education on self care/management/and exacerbation prevention; Advised patient to track and manage COPD triggers;  Provided instruction about proper use of medications used for management of COPD including inhalers; Advised patient to self assesses COPD action plan zone and make appointment with provider if in the yellow zone for 48 hours without improvement; Discussed the importance of adequate rest and management of fatigue with COPD; Encouraged patient to use  nebulizer if rescue inhaler not helping shortness of breath Discussed contacting providers for shortness of breath or increased need for rescue inhaler or nebulizer prior to needing to call EMS  Encouraged to increase activity as tolerated-  Patient reports that she and her daughter continue to walk every day, about 1/2 mile.  Heart Failure Interventions:  (Status: Goal on track: NO.q)  Long Term Goal Basic overview and discussion of pathophysiology of Heart Failure reviewed; Provided education on low sodium  diet; Reviewed Heart Failure Action Plan in depth and provided written copy; Provided education about placing scale on hard, flat surface; Advised patient to weigh each morning after emptying bladder; Discussed importance of daily weight and advised patient to weigh and record daily; Discussed the importance of keeping all appointments with provider; Discussed when to call provider based on weights and symptoms Fall precautions and preventions reviewed and disussed  Diabetes:  (Status: Goal on Track (progressing): YES.)  Long Term Goal Lab Results  Component Value Date   HGBA1C 7.3 (H) 03/31/2021  Assessed patient's understanding of A1c goal: <7% Provided education to patient about basic DM disease process; Reviewed medications with patient and discussed importance of medication adherence;        Reviewed prescribed diet with patient low salt carb modified diabetic; Advised patient, providing education and rationale, to check cbg daily and record        call provider for findings outside established parameters;         Patient Goals/Self-Care Activities: Patient will self administer medications as prescribed Patient will attend all scheduled provider appointments Patient will continue to perform ADL's independently Develop a rescue plan & follow rescue plan if symptoms flare-up Eliminate symptom triggers at home Consider using nebulizer for increase in shortness of breath Contact provider for increase shortness of breath and increased need for rescue inhaler  Keep and attend follow-up appointments Weigh self daily and keep in log for provider review Notify if you gain 2-3 pounds overnight of 5 pounds in 5 days Develop a rescue plan & follow rescue plan if symptoms flare-up Know when to call the doctor Low salt and low carbohydrate diabetic diet Check blood sugars at least daily, keeping log for provider review Contact PCP for follow from fall this morning    The care management  team will reach out to the patient again over the next 45 days.  Plan:  Rhae Lerner RN, MSN RN Care Management Coordinator [facility] [phone] [email or web address]

## 2021-10-26 ENCOUNTER — Emergency Department: Payer: Medicare Other

## 2021-10-26 ENCOUNTER — Other Ambulatory Visit: Payer: Self-pay

## 2021-10-26 ENCOUNTER — Ambulatory Visit (INDEPENDENT_AMBULATORY_CARE_PROVIDER_SITE_OTHER): Payer: Medicare Other | Admitting: Internal Medicine

## 2021-10-26 ENCOUNTER — Encounter: Payer: Self-pay | Admitting: Internal Medicine

## 2021-10-26 VITALS — BP 130/70 | HR 74 | Temp 97.8°F | Resp 16 | Ht 61.0 in | Wt 122.6 lb

## 2021-10-26 DIAGNOSIS — E1159 Type 2 diabetes mellitus with other circulatory complications: Secondary | ICD-10-CM | POA: Diagnosis not present

## 2021-10-26 DIAGNOSIS — J019 Acute sinusitis, unspecified: Secondary | ICD-10-CM | POA: Insufficient documentation

## 2021-10-26 DIAGNOSIS — I25118 Atherosclerotic heart disease of native coronary artery with other forms of angina pectoris: Secondary | ICD-10-CM

## 2021-10-26 DIAGNOSIS — G9389 Other specified disorders of brain: Secondary | ICD-10-CM | POA: Diagnosis not present

## 2021-10-26 DIAGNOSIS — Z20822 Contact with and (suspected) exposure to covid-19: Secondary | ICD-10-CM | POA: Insufficient documentation

## 2021-10-26 DIAGNOSIS — J45909 Unspecified asthma, uncomplicated: Secondary | ICD-10-CM | POA: Insufficient documentation

## 2021-10-26 DIAGNOSIS — R42 Dizziness and giddiness: Secondary | ICD-10-CM

## 2021-10-26 DIAGNOSIS — I5032 Chronic diastolic (congestive) heart failure: Secondary | ICD-10-CM | POA: Insufficient documentation

## 2021-10-26 DIAGNOSIS — Z955 Presence of coronary angioplasty implant and graft: Secondary | ICD-10-CM | POA: Diagnosis not present

## 2021-10-26 DIAGNOSIS — Z7951 Long term (current) use of inhaled steroids: Secondary | ICD-10-CM | POA: Diagnosis not present

## 2021-10-26 DIAGNOSIS — I2510 Atherosclerotic heart disease of native coronary artery without angina pectoris: Secondary | ICD-10-CM | POA: Insufficient documentation

## 2021-10-26 DIAGNOSIS — I252 Old myocardial infarction: Secondary | ICD-10-CM

## 2021-10-26 DIAGNOSIS — Z79899 Other long term (current) drug therapy: Secondary | ICD-10-CM | POA: Diagnosis not present

## 2021-10-26 DIAGNOSIS — H8110 Benign paroxysmal vertigo, unspecified ear: Secondary | ICD-10-CM | POA: Diagnosis not present

## 2021-10-26 DIAGNOSIS — J449 Chronic obstructive pulmonary disease, unspecified: Secondary | ICD-10-CM | POA: Diagnosis not present

## 2021-10-26 DIAGNOSIS — I10 Essential (primary) hypertension: Secondary | ICD-10-CM | POA: Diagnosis not present

## 2021-10-26 DIAGNOSIS — Z8616 Personal history of COVID-19: Secondary | ICD-10-CM | POA: Insufficient documentation

## 2021-10-26 DIAGNOSIS — Z7984 Long term (current) use of oral hypoglycemic drugs: Secondary | ICD-10-CM | POA: Diagnosis not present

## 2021-10-26 DIAGNOSIS — E119 Type 2 diabetes mellitus without complications: Secondary | ICD-10-CM | POA: Insufficient documentation

## 2021-10-26 DIAGNOSIS — Z7982 Long term (current) use of aspirin: Secondary | ICD-10-CM | POA: Insufficient documentation

## 2021-10-26 DIAGNOSIS — I110 Hypertensive heart disease with heart failure: Secondary | ICD-10-CM | POA: Insufficient documentation

## 2021-10-26 DIAGNOSIS — D509 Iron deficiency anemia, unspecified: Secondary | ICD-10-CM

## 2021-10-26 DIAGNOSIS — E7800 Pure hypercholesterolemia, unspecified: Secondary | ICD-10-CM

## 2021-10-26 LAB — CBC
HCT: 42.1 % (ref 36.0–46.0)
Hemoglobin: 13.3 g/dL (ref 12.0–15.0)
MCH: 27.8 pg (ref 26.0–34.0)
MCHC: 31.6 g/dL (ref 30.0–36.0)
MCV: 88.1 fL (ref 80.0–100.0)
Platelets: 290 10*3/uL (ref 150–400)
RBC: 4.78 MIL/uL (ref 3.87–5.11)
RDW: 13.2 % (ref 11.5–15.5)
WBC: 8.8 10*3/uL (ref 4.0–10.5)
nRBC: 0 % (ref 0.0–0.2)

## 2021-10-26 LAB — BASIC METABOLIC PANEL
Anion gap: 8 (ref 5–15)
BUN: 12 mg/dL (ref 8–23)
CO2: 25 mmol/L (ref 22–32)
Calcium: 9.6 mg/dL (ref 8.9–10.3)
Chloride: 107 mmol/L (ref 98–111)
Creatinine, Ser: 0.74 mg/dL (ref 0.44–1.00)
GFR, Estimated: >60 mL/min (ref >60)
Glucose, Bld: 170 mg/dL — ABNORMAL HIGH (ref 70–99)
Potassium: 3.6 mmol/L (ref 3.5–5.1)
Sodium: 140 mmol/L (ref 135–145)

## 2021-10-26 NOTE — Assessment & Plan Note (Signed)
Breathing improved now.  Using nebulizer q am.  Using advair.

## 2021-10-26 NOTE — Assessment & Plan Note (Addendum)
Low carb diet and exercise as tolerated.  Follow sugars.   Follow metb and a1c.  Ate lunch prior to coming in to the office.  No problem with low blood sugars.

## 2021-10-26 NOTE — ED Triage Notes (Signed)
Pt to ED from dr scott for dizziness that started today upon sitting up. Fall a week ago, did not hit head  MSE Hornick PA

## 2021-10-26 NOTE — Assessment & Plan Note (Signed)
Previously admitted for NSTEMI.  Elected Microbiologist.  Continue lipitor, metoprolol and imdur.  No chest pain.  Symptoms as outlined.  Discussed ER evaluation for further w/up.

## 2021-10-26 NOTE — Assessment & Plan Note (Signed)
Dizziness and "head not feeling right" - noted here in the office.  Persistent.  Unsteady gait.  Felt worse during her appt.  Recent fall at home.  Unclear etiology.  Recent CT with old infarcts.  EKG - SR with no acute change when compared to previous EKG.  Not orthostatic on exam. No focal neurological changes on exam.   Lives by herself. Concern regarding change and persistent symptoms.  Discussed further w/up and evaluation.  Discussed need for ER evaluation.  She is agreeable.  Pam (daughter notified).  ER notified.

## 2021-10-26 NOTE — Assessment & Plan Note (Signed)
Continue lisinopril, imdur and metoprolol.  No evidence of volume overload currently.  Follow.

## 2021-10-26 NOTE — Assessment & Plan Note (Signed)
Discussed need for further lab and recheck cbc given weakness and dizziness.

## 2021-10-26 NOTE — Assessment & Plan Note (Addendum)
Continue lisinopril, imdur and metoprolol.  Blood pressure elevated on recheck.   Follow pressures.  Follow metabolic panel.  Not orthostatic on exam.  Check electrolytes.

## 2021-10-26 NOTE — ED Provider Notes (Signed)
Emergency Medicine Provider Triage Evaluation Note  [facility] Tanya Cole, a 87 y.o. female  was evaluated in triage.  Pt complains of dizziness and near syncope.  Patient had a mechanical fall last week but denies any head injury or LOC.  She presented to the ED for routine evaluation today, and had 2 nursing of episodes while changing positions.  Patient denies any frank pain at this time.  Review of Systems  Positive: Near-syncope, dizziness Negative: FCS  Physical Exam  BP (!) 152/58 (BP Location: Left Arm)    Pulse 65    Temp 99.1 F (37.3 C) (Oral)    Resp 15    Ht 5\' 1"  (1.549 m)    Wt 55.6 kg    SpO2 94%    BMI 23.16 kg/m  Gen:   Awake, no distress  NAD Resp:  Normal effort CTA MSK:   Moves extremities without difficulty  Other:  CVS: RRR  Medical Decision Making  Medically screening exam initiated at 6:09 PM.  Appropriate orders placed.  Kluender was informed that the remainder of the evaluation will be completed by another provider, this initial triage assessment does not replace that evaluation, and the importance of remaining in the ED until their evaluation is complete.  Geriatric patient with ED evaluation of near syncopal episodes today while at the provider's office.   Tanya Elks, PA-C 10/26/21 1813    10/28/21, MD 10/26/21 [phone]

## 2021-10-26 NOTE — Assessment & Plan Note (Signed)
Known CAD s/p PCI/DES - mid LAD.  Taking metoprolol, imdur and lisinopril.  No chest pain.  Recent fall of unclear etiology (stood to get out of bed).  Today with lying to sitting and sitting to standing - describes "head not feeling right" and subsequent nausea.  EKG - SR - no acute ischemic changes.  Discussed further w/up to try and determine light headedness, dizziness and recent fall.  Agreeable to ER evaluation.

## 2021-10-26 NOTE — Assessment & Plan Note (Signed)
Continue lipitor.  Low cholesterol diet and exercise.  Follow lipid panel and liver function tests.   

## 2021-10-26 NOTE — Progress Notes (Addendum)
[name], female   DOB: 01-22-1933, 87 y.o.   MRN: [id number]   Subjective:    [name], female    DOB: [birth year], 87 y.o.   MRN: [id number]  This visit occurred during the SARS-CoV-2 public health emergency.  Safety protocols were in place, including screening questions prior to the visit, additional usage of staff PPE, and extensive cleaning of exam room while observing appropriate contact time as indicated for disinfecting solutions.   Patient here for a scheduled follow up.   HPI Here to follow up regarding her blood pressure and blood sugar.  Had evaluation in ER 09/15/21 - for neck pain and headache.  Work up in ER - CT head - remote infarcts (left cerebellar and right corona radiata).  Also sinus disease.  C-spine CT - degenerative changes - no acute fracture.  Labs unrevealing - BNP slightly elevated 180. Discharged.  F/u with Dr Kirke Corin - 09/21/21.  Planning for carotid ultrasound - scheduled this week.  She comes in today accompanied by her daughter.  History obtained from both of them.  She fell 10/22/21.  Unwitnessed fall.  Denies any syncope or near syncope.  No LOC.  No residual headache or dizziness from fall.  Unclear etiology of fall.  Denies chest pain.  Breathing overall relatively stable.  No increased cough or congestion.  Some minimal sinus congestion.  No nausea or vomiting. No abdominal pain.  Bowels moving.  While in the office, started feeling worse.  (Head felt funny - describes dizziness and nausea).  Intermittent episodes. Initially appeared to be more positional.  Later more unsteady with standing and walking.     Past Medical History:  Diagnosis Date   (HFpEF) heart failure with preserved ejection fraction (HCC)    a. TTE 12/19: EF 55-60%, probable HK of the mid apical anterior septal myocardium, Gr1DD, mild AI, mildly dilated LA; b.07/2020 Echo: EF 60-65%, no rwma, Gr2 DD. Nl RV fxn. Mildly dil LA. Mild MR.   Asthma    CAD  (coronary artery disease)    a. 09/2018 NSTEMI/PCI: LM min irregs, mLAD 95 (PCI/DES), mLAD-2 60%, LCx mild diff dzs, RCA min irregs; b. 03/2020 MV: EF>65%, no ischemia/scar; c. 07/2020 Cath: LM min irregs, LAD 30p, 34m, 90d, D1/2 min irregs, LCX diff dzs throughout, OM1/2/3 mild dzs, RCA 30p, RPDA/RPAV min irrges. EF 55-65%.   CHF (congestive heart failure) (HCC)    Diabetes mellitus (HCC)    Hypercholesterolemia    Hypertension    Myocardial infarction (HCC)    Osteopenia    Palpitations    a. 04/2020 Zio: Avg HR 75. 429 SVT episodes, longest 19 secs @ 133. Occas PACs (3.2%). Rare PVCs (<1%).   Polymyalgia rheumatica syndrome (HCC)    Reactive airway disease    Past Surgical History:  Procedure Laterality Date   ABDOMINAL HYSTERECTOMY  1981   prolapse and bleeding, ovaries not removed   BREAST EXCISIONAL BIOPSY Right    CHOLECYSTECTOMY N/A 09/02/2019   Procedure: LAPAROSCOPIC CHOLECYSTECTOMY WITH INTRAOPERATIVE CHOLANGIOGRAM;  Surgeon: Henrene Dodge, MD;  Location: [facility];  Service: General;  Laterality: N/A;   CORONARY STENT INTERVENTION N/A 10/08/2018   Procedure: CORONARY STENT INTERVENTION;  Surgeon: Iran Ouch, MD;  Location: [facility];  Service: Cardiovascular;  Laterality: N/A;   ENDOSCOPIC RETROGRADE CHOLANGIOPANCREATOGRAPHY (ERCP) WITH PROPOFOL N/A 08/08/2019   Procedure: ENDOSCOPIC RETROGRADE CHOLANGIOPANCREATOGRAPHY (ERCP) WITH PROPOFOL;  Surgeon: Midge Minium, MD;  Location: [facility];  Service: Endoscopy;  Laterality: N/A;   LEFT HEART CATH AND CORONARY ANGIOGRAPHY N/A 10/08/2018   Procedure: LEFT HEART CATH AND CORONARY ANGIOGRAPHY;  Surgeon: Iran Ouch, MD;  Location: [facility];  Service: Cardiovascular;  Laterality: N/A;   LEFT HEART CATH AND CORONARY ANGIOGRAPHY N/A 08/10/2020   Procedure: LEFT HEART CATH AND CORONARY ANGIOGRAPHY possible percutaneous intervention;  Surgeon: Iran Ouch, MD;  Location: [facility];   Service: Cardiovascular;  Laterality: N/A;   UMBILICAL HERNIA REPAIR  7/94   Family History  Problem Relation Age of Onset   Arthritis Mother    Heart disease Mother    Heart attack Father    Throat cancer Sister    Parkinson's disease Sister    COPD Brother    Social History   Socioeconomic History   Marital status: Widowed    Spouse name: Not on file   Number of children: 3   Years of education: Not on file   Highest education level: Not on file  Occupational History   Not on file  Tobacco Use   Smoking status: Never   Smokeless tobacco: Never  Vaping Use   Vaping Use: Never used  Substance and Sexual Activity   Alcohol use: No    Alcohol/week: 0.0 standard drinks   Drug use: No   Sexual activity: Not Currently  Other Topics Concern   Not on file  Social History Narrative   No smoking; no alcohol; in [place]; worked in Designer, fashion/clothing. Lives by self in [place]. Does all of her own housework. Dtr does food shopping for her.   Social Determinants of Health   Financial Resource Strain: Low Risk    Difficulty of Paying Living Expenses: Not hard at all  Food Insecurity: No Food Insecurity   Worried About Programme researcher, broadcasting/film/video in the Last Year: Never true   Ran Out of Food in the Last Year: Never true  Transportation Needs: No Transportation Needs   Lack of Transportation (Medical): No   Lack of Transportation (Non-Medical): No  Physical Activity: Not on file  Stress: No Stress Concern Present   Feeling of Stress : Not at all  Social Connections: Moderately Integrated   Frequency of Communication with Friends and Family: More than three times a week   Frequency of Social Gatherings with Friends and Family: More than three times a week   Attends Religious Services: More than 4 times per year   Active Member of [employer] or Organizations: Yes   Attends Banker Meetings: Not on file   Marital Status: Widowed     Review of Systems  Constitutional:  Negative  for appetite change and unexpected weight change.  HENT:  Negative for congestion and sinus pressure.   Respiratory:  Negative for cough and chest tightness.        Breathing stable.   Cardiovascular:  Negative for chest pain, palpitations and leg swelling.  Gastrointestinal:  Negative for abdominal pain, constipation and diarrhea.       Some nausea today.    Genitourinary:  Negative for difficulty urinating and dysuria.  Musculoskeletal:  Negative for joint swelling and myalgias.  Skin:  Negative for color change and rash.  Neurological:        Dizziness here in the office as outlined.  No headache now.    Psychiatric/Behavioral:  Negative for agitation and dysphoric mood.       Objective:     BP 130/70    Pulse 74  Temp 97.8 F (36.6 C)    Resp 16    Ht 5\' 1"  (1.549 m)    Wt 122 lb 9.6 oz (55.6 kg)    SpO2 97%    BMI 23.17 kg/m  Wt Readings from Last 3 Encounters:  10/26/21 122 lb 9.2 oz (55.6 kg)  10/26/21 122 lb 9.6 oz (55.6 kg)  09/21/21 123 lb 2 oz (55.8 kg)   Not orthostatic on exam.   Physical Exam Vitals reviewed.  Constitutional:      Appearance: Normal appearance.     Comments: Appears not to feel well.  Became dizzy with going from lying to sitting.  Sat for a while symptoms improved.  When she went to stand - "head felt funny".  Some nausea.    HENT:     Head: Normocephalic and atraumatic.     Right Ear: External ear normal.     Left Ear: External ear normal.  Eyes:     General: No scleral icterus.       Right eye: No discharge.        Left eye: No discharge.     Conjunctiva/sclera: Conjunctivae normal.  Neck:     Thyroid: No thyromegaly.  Cardiovascular:     Rate and Rhythm: Normal rate and regular rhythm.  Pulmonary:     Effort: No respiratory distress.     Breath sounds: Normal breath sounds. No wheezing.  Abdominal:     General: Bowel sounds are normal.     Palpations: Abdomen is soft.     Tenderness: There is no abdominal tenderness.   Musculoskeletal:        General: No swelling or tenderness.     Cervical back: Neck supple. No tenderness.  Lymphadenopathy:     Cervical: No cervical adenopathy.  Skin:    Findings: No erythema or rash.  Neurological:     Mental Status: She is alert.     Comments: Some unsteadiness with standing and walking.   Psychiatric:        Mood and Affect: Mood normal.        Behavior: Behavior normal.     Outpatient Encounter Medications as of 10/26/2021  Medication Sig   alendronate (FOSAMAX) 70 MG tablet Take 70 mg by mouth once a week.    aspirin 81 MG tablet Take 81 mg by mouth daily.   atorvastatin (LIPITOR) 80 MG tablet TAKE 1 TABLET BY MOUTH ONCE DAILY AT  6PM   fluticasone (FLONASE) 50 MCG/ACT nasal spray Use 2 spray(s) in each nostril once daily   fluticasone-salmeterol (ADVAIR) 250-50 MCG/ACT AEPB Inhale 1 puff into the lungs in the morning and at bedtime.   ipratropium-albuterol (DUONEB) 0.5-2.5 (3) MG/3ML SOLN Take 3 mLs by nebulization 2 (two) times daily.   metFORMIN (GLUCOPHAGE) 500 MG tablet TAKE 1 TABLET BY MOUTH TWICE DAILY WITH A MEAL   metoprolol tartrate (LOPRESSOR) 25 MG tablet Take 1 tablet by mouth twice daily   nitroGLYCERIN (NITROSTAT) 0.4 MG SL tablet Place 1 tablet (0.4 mg total) under the tongue every 5 (five) minutes as needed for chest pain.   PROAIR HFA 108 (90 Base) MCG/ACT inhaler Inhale 2 puffs into the lungs every 4 (four) hours as needed for wheezing or shortness of breath.   Vitamin D, Ergocalciferol, (DRISDOL) 50000 units CAPS capsule Take 50,000 Units by mouth once a week.   No facility-administered encounter medications on file as of 10/26/2021.     Lab Results  Component Value Date  WBC 8.8 10/26/2021   HGB 13.3 10/26/2021   HCT 42.1 10/26/2021   PLT 290 10/26/2021   GLUCOSE 170 (H) 10/26/2021   CHOL 175 06/27/2021   TRIG 113 06/27/2021   HDL 44 06/27/2021   LDLDIRECT 136.0 07/03/2020   LDLCALC 108 (H) 06/27/2021   ALT 11 09/15/2021    AST 14 (L) 09/15/2021   NA 140 10/26/2021   K 3.6 10/26/2021   CL 107 10/26/2021   CREATININE 0.74 10/26/2021   BUN 12 10/26/2021   CO2 25 10/26/2021   TSH 2.50 07/23/2021   INR 1.1 08/07/2020   HGBA1C 7.3 (H) 03/31/2021   MICROALBUR 1.1 07/23/2021    DG Chest 2 View  Result Date: 09/15/2021 CLINICAL DATA:  Chest pain, shortness of breath EXAM: CHEST - 2 VIEW COMPARISON:  07/23/2021 FINDINGS: Cardiac size is within normal limits. Linear densities seen in the lower lung fields suggesting scarring or subsegmental atelectasis with interval worsening. There is no focal pulmonary consolidation. There is no pleural effusion or pneumothorax. IMPRESSION: There are no signs of pulmonary edema or focal pulmonary consolidation. There are small linear densities in both lower lung fields suggesting scarring and possibly subsegmental atelectasis. There is interval worsening of this finding in comparison with the study of 07/23/2021. Electronically Signed   By: Ernie Avena M.D.   On: 09/15/2021 11:42   CT HEAD WO CONTRAST ( )  Result Date: 09/15/2021 CLINICAL DATA:  Headache, intracranial hemorrhage suspected; Compression fracture, C-spine EXAM: CT HEAD WITHOUT CONTRAST CT CERVICAL SPINE WITHOUT CONTRAST TECHNIQUE: Multidetector CT imaging of the head and cervical spine was performed following the standard protocol without intravenous contrast. Multiplanar CT image reconstructions of the cervical spine were also generated. COMPARISON:  None. FINDINGS: CT HEAD FINDINGS Brain: Remote left cerebellar no evidence of acute infarction, hemorrhage, hydrocephalus, extra-axial collection or mass lesion/mass effect. Infarcts. Moderate patchy white matter hypoattenuation nonspecific but compatible with chronic microvascular ischemic disease. Probable small remote infarct in the right corona radiata. Vascular: Calcific intracranial atherosclerosis. No hyperdense vessel identified. Skull: No acute fracture.   Hyperostosis frontalis. Sinuses/Orbits: Completely opacified left frontal sinus with multiple osteomas. Complete opacification of bilateral frontal sinuses. Moderate mucosal thickening of bilateral inferior maxillary sinuses and mild bilateral sphenoid sinus mucosal thickening. Other: No mastoid effusions. CT CERVICAL SPINE FINDINGS Alignment: Slight anterolisthesis of C4 on C5, likely degenerative given facet arthropathy at this level. Otherwise, no substantial sagittal subluxation. Skull base and vertebrae: Vertebral body heights are maintained. No evidence of acute fracture diffuse osteopenia. Soft tissues and spinal canal: No prevertebral fluid or swelling. No visible canal hematoma. Disc levels: Moderate to severe multilevel degenerative disc disease and facet arthropathy. Upper chest: Visualized lung apices are clear. Other: Carotid bifurcation atherosclerosis. IMPRESSION: CT head: 1. No evidence of acute intracranial abnormality. 2. 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 3. Severe paranasal sinus disease, including complete opacification of frontal and ethmoid sinuses and multiple left frontal osteomas. CT cervical spine: 1. No evidence of acute fracture or traumatic malalignment. 2. Moderate to severe degenerative change. Electronically Signed   By: Feliberto Harts M.D.   On: 09/15/2021 12:43   CT Cervical Spine Wo Contrast  Result Date: 09/15/2021 CLINICAL DATA:  Headache, intracranial hemorrhage suspected; Compression fracture, C-spine EXAM: CT HEAD WITHOUT CONTRAST CT CERVICAL SPINE WITHOUT CONTRAST TECHNIQUE: Multidetector CT imaging of the head and cervical spine was performed following the standard protocol without intravenous contrast. Multiplanar CT image reconstructions of the cervical spine  were also generated. COMPARISON:  None. FINDINGS: CT HEAD FINDINGS Brain: Remote left cerebellar no evidence of acute infarction,  hemorrhage, hydrocephalus, extra-axial collection or mass lesion/mass effect. Infarcts. Moderate patchy white matter hypoattenuation nonspecific but compatible with chronic microvascular ischemic disease. Probable small remote infarct in the right corona radiata. Vascular: Calcific intracranial atherosclerosis. No hyperdense vessel identified. Skull: No acute fracture.  Hyperostosis frontalis. Sinuses/Orbits: Completely opacified left frontal sinus with multiple osteomas. Complete opacification of bilateral frontal sinuses. Moderate mucosal thickening of bilateral inferior maxillary sinuses and mild bilateral sphenoid sinus mucosal thickening. Other: No mastoid effusions. CT CERVICAL SPINE FINDINGS Alignment: Slight anterolisthesis of C4 on C5, likely degenerative given facet arthropathy at this level. Otherwise, no substantial sagittal subluxation. Skull base and vertebrae: Vertebral body heights are maintained. No evidence of acute fracture diffuse osteopenia. Soft tissues and spinal canal: No prevertebral fluid or swelling. No visible canal hematoma. Disc levels: Moderate to severe multilevel degenerative disc disease and facet arthropathy. Upper chest: Visualized lung apices are clear. Other: Carotid bifurcation atherosclerosis. IMPRESSION: CT head: 1. No evidence of acute intracranial abnormality. 2. 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 3. Severe paranasal sinus disease, including complete opacification of frontal and ethmoid sinuses and multiple left frontal osteomas. CT cervical spine: 1. No evidence of acute fracture or traumatic malalignment. 2. Moderate to severe degenerative change. Electronically Signed   By: Feliberto Harts M.D.   On: 09/15/2021 12:43       Assessment & Plan:   Problem List Items Addressed This Visit     Anemia    Discussed need for further lab and recheck cbc given weakness and dizziness.         Chronic diastolic CHF (congestive heart failure) (HCC)    Continue lisinopril, imdur and metoprolol.  No evidence of volume overload currently.  Follow.       COPD (chronic obstructive pulmonary disease) (HCC)    Breathing improved now.  Using nebulizer q am.  Using advair.        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) - Primary    Known CAD s/p PCI/DES - mid LAD.  Taking metoprolol, imdur and lisinopril.  No chest pain.  Recent fall of unclear etiology (stood to get out of bed).  Today with lying to sitting and sitting to standing - describes "head not feeling right" and subsequent nausea.  EKG - SR - no acute ischemic changes.  Discussed further w/up to try and determine light headedness, dizziness and recent fall.  Agreeable to ER evaluation.       Relevant Orders   EKG 12-Lead (Completed)   Dizziness    Dizziness and "head not feeling right" - noted here in the office.  Persistent.  Unsteady gait.  Felt worse during her appt.  Recent fall at home.  Unclear etiology.  Recent CT with old infarcts.  EKG - SR with no acute change when compared to previous EKG.  Not orthostatic on exam. No focal neurological changes on exam.   Lives by herself. Concern regarding change and persistent symptoms.  Discussed further w/up and evaluation.  Discussed need for ER evaluation.  She is agreeable.  Pam (daughter notified).  ER notified.        History of non-ST elevation myocardial infarction (NSTEMI)    Previously admitted for NSTEMI.  Elected Microbiologist.  Continue lipitor, metoprolol and imdur.  No chest pain.  Symptoms as outlined.  Discussed ER evaluation for further w/up.       Hypercholesterolemia    Continue lipitor.  Low cholesterol diet and exercise.  Follow lipid panel and liver function tests.        Hypertension    Continue lisinopril, imdur and metoprolol.  Blood pressure elevated on recheck.   Follow pressures.  Follow metabolic panel.   Not orthostatic on exam.  Check electrolytes.       Type 2 diabetes mellitus with cardiac complication (HCC)    Low carb diet and exercise as tolerated.  Follow sugars.   Follow metb and a1c.  Ate lunch prior to coming in to the office.  No problem with low blood sugars.          Dale Middleton, MD

## 2021-10-27 ENCOUNTER — Emergency Department: Payer: Medicare Other

## 2021-10-27 ENCOUNTER — Telehealth: Payer: Self-pay | Admitting: Internal Medicine

## 2021-10-27 ENCOUNTER — Emergency Department
Admission: EM | Admit: 2021-10-27 | Discharge: 2021-10-27 | Disposition: A | Discharge status: Home / Self Care | Payer: Medicare Other | Attending: Emergency Medicine | Admitting: Emergency Medicine

## 2021-10-27 DIAGNOSIS — R42 Dizziness and giddiness: Secondary | ICD-10-CM | POA: Diagnosis not present

## 2021-10-27 DIAGNOSIS — J019 Acute sinusitis, unspecified: Secondary | ICD-10-CM

## 2021-10-27 DIAGNOSIS — H8110 Benign paroxysmal vertigo, unspecified ear: Secondary | ICD-10-CM | POA: Diagnosis not present

## 2021-10-27 DIAGNOSIS — G9389 Other specified disorders of brain: Secondary | ICD-10-CM | POA: Diagnosis not present

## 2021-10-27 LAB — RESP PANEL BY RT-PCR (FLU A&B, COVID) ARPGX2
Influenza A by PCR: NEGATIVE
Influenza B by PCR: NEGATIVE
SARS Coronavirus 2 by RT PCR: NEGATIVE

## 2021-10-27 LAB — URINALYSIS, COMPLETE (UACMP) WITH MICROSCOPIC
Bilirubin Urine: NEGATIVE
Glucose, UA: NEGATIVE mg/dL
Hgb urine dipstick: NEGATIVE
Ketones, ur: NEGATIVE mg/dL
Leukocytes,Ua: NEGATIVE
Nitrite: NEGATIVE
Protein, ur: NEGATIVE mg/dL
Specific Gravity, Urine: 1.016 (ref 1.005–1.030)
pH: 5 (ref 5.0–8.0)

## 2021-10-27 LAB — TROPONIN I (HIGH SENSITIVITY): Troponin I (High Sensitivity): 7 ng/L (ref <18)

## 2021-10-27 MED ORDER — MECLIZINE HCL 25 MG PO TABS
25.0000 mg | ORAL_TABLET | Freq: Once | ORAL | Status: AC
  Administered 2021-10-27: 04:00:00 25 mg via ORAL
  Filled 2021-10-27: qty 1

## 2021-10-27 MED ORDER — MECLIZINE HCL 25 MG PO TABS
25.0000 mg | ORAL_TABLET | Freq: Three times a day (TID) | ORAL | 0 refills | Status: DC | PRN
Start: 2021-10-27 — End: 2022-04-02

## 2021-10-27 MED ORDER — ONDANSETRON HCL 4 MG/2ML IJ SOLN
4.0000 mg | Freq: Once | INTRAMUSCULAR | Status: AC
  Administered 2021-10-27: 05:00:00 4 mg via INTRAVENOUS
  Filled 2021-10-27: qty 2

## 2021-10-27 MED ORDER — ONDANSETRON 4 MG PO TBDP: 4.0000 mg | ORAL_TABLET | Freq: Three times a day (TID) | ORAL | 0 refills | Status: DC | PRN

## 2021-10-27 MED ORDER — LORAZEPAM 2 MG/ML IJ SOLN: 1.0000 mg | Freq: Once | INTRAMUSCULAR | Status: AC

## 2021-10-27 MED ORDER — LORAZEPAM 2 MG/ML IJ SOLN
INTRAMUSCULAR | Status: AC
  Administered 2021-10-27: 05:00:00 1 mg via INTRAVENOUS
  Filled 2021-10-27: qty 1

## 2021-10-27 MED ORDER — AMOXICILLIN-POT CLAVULANATE ER 1000-62.5 MG PO TB12: 1.0000 | ORAL_TABLET | Freq: Two times a day (BID) | ORAL | 0 refills | Status: AC

## 2021-10-27 MED ORDER — LACTATED RINGERS IV BOLUS
500.0000 mL | Freq: Once | INTRAVENOUS | Status: AC
  Administered 2021-10-27: 04:00:00 500 mL via INTRAVENOUS

## 2021-10-27 NOTE — ED Provider Notes (Signed)
----------------------------------------- °  7:03 AM on 10/27/2021 -----------------------------------------  Blood pressure (!) 170/59, pulse 78, temperature 99.1 F (37.3 C), temperature source Oral, resp. rate 19, height 5\' 1"  (1.549 m), weight 55.6 kg, SpO2 98 %.  Assuming care from Dr. .  In short, Tanya Cole is a 87 y.o. female with a chief complaint of Dizziness .  Refer to the original H&P for additional details.  The current plan of care is to reassess and ambulate.  ----------------------------------------- 10:33 AM on 10/27/2021 ----------------------------------------- Patient is now more awake and alert after receiving dose of IV Ativan.  She was able to ambulate to the bathroom with assistance, does report ongoing dizziness with positional changes.  Given negative MRI, this is consistent with a peripheral vertigo.  She is interested in discharge home with outpatient follow-up with her PCP and ENT, will trial meclizine and Zofran for use as needed.  Family was counseled to have patient return to the ED for new worsening symptoms, patient and family agree with plan.    10/29/2021, MD 10/27/21 [phone]

## 2021-10-27 NOTE — ED Notes (Signed)
Pt assisted to restroom for urination and BM. Reports dizziness with changing positions (when sitting up and standing). Required assistance when walking.  Dr Larinda Buttery notified

## 2021-10-27 NOTE — Telephone Encounter (Signed)
Pt was seen in office 12/27 with PCP where she was advised to be evaluated by ED. Pt is home and currently under care of daughter Victorino Dike who called in regards to pt needing hosp f/u. Due to limited appt availability pts daughter was advised she would be contacted by CMA in order to get that scheduled.

## 2021-10-27 NOTE — ED Provider Notes (Signed)
[facility] Emergency Department Provider Note  ____________________________________________  Time seen: Approximately 4:23 AM  I have reviewed the triage vital signs and the nursing notes.   HISTORY  Chief Complaint Dizziness   HPI [place] Stong is a 87 y.o. female with a history of diastolic CHF, CAD, diabetes, hypertension, hyperlipidemia, polymyalgia rheumatica, reactive airway disease, asthma who presents for evaluation of dizziness.  Patient reports that she had a mechanical fall last week with no injury.  Went to her PCP for a regular checkup after finding out she had old infarcts on CT done at the ER 2 weeks ago when she presented for a HA.  While at her PCPs office patient started having dizziness.  She describes it as room spinning sensation.  She reports that the episodes are brief and resolve without intervention.  She reports that the episodes happen mostly when she changes position.  She has had normal gait, no diplopia, no dysarthria, no dysphagia, no difficulty finding words, no slurred speech, no unilateral weakness or numbness.  She reports having 2 more brief episodes while in the waiting room.  Patient does report sinus congestion and pressure in the maxillary sinuses bilaterally but denies any prior history of vertigo.  No fever or chills, no cough or congestion, no chest pain or shortness of breath, no vomiting or diarrhea, no abdominal pain.   Past Medical History:  Diagnosis Date   (HFpEF) heart failure with preserved ejection fraction ([place])    a. TTE 12/19: EF 55-60%, probable HK of the mid apical anterior septal myocardium, Gr1DD, mild AI, mildly dilated LA; b.07/2020 Echo: EF 60-65%, no rwma, Gr2 DD. Nl RV fxn. Mildly dil LA. Mild MR.   Asthma    CAD (coronary artery disease)    a. 09/2018 NSTEMI/PCI: LM min irregs, mLAD 95 (PCI/DES), mLAD-2 60%, LCx mild diff dzs, RCA min irregs; b. 03/2020 MV: EF>65%, no ischemia/scar; c. 07/2020  Cath: LM min irregs, LAD 30p, 30m, 90d, D1/2 min irregs, LCX diff dzs throughout, OM1/2/3 mild dzs, RCA 30p, RPDA/RPAV min irrges. EF 55-65%.   CHF (congestive heart failure) ([place])    Diabetes mellitus ([place])    Hypercholesterolemia    Hypertension    Myocardial infarction ([place])    Osteopenia    Palpitations    a. 04/2020 Zio: Avg HR 75. 429 SVT episodes, longest 19 secs @ 133. Occas PACs (3.2%). Rare PVCs (<1%).   Polymyalgia rheumatica syndrome (HCC)    Reactive airway disease     Patient Active Problem List   Diagnosis Date Noted   Dizziness 10/26/2021   History of COVID-19 06/27/2021   COPD (chronic obstructive pulmonary disease) ([place]) 05/21/2021   Hypomagnesemia 05/14/2021   CAD (coronary artery disease)    Severe sepsis (HCC)    Hypokalemia    Acute respiratory failure with hypoxia (HCC)    Asthma exacerbation    Joint ache 04/09/2021   Calcified granuloma of lung ([place]) 03/02/2021   Acute exacerbation of chronic obstructive pulmonary disease (COPD) ([place]) 03/01/2021   COPD exacerbation ([place]) 11/29/2020   Chronic diastolic CHF (congestive heart failure) ([place]) 11/29/2020   CAP (community acquired pneumonia) 11/29/2020   Rib pain on right side 10/19/2020   Abnormal CXR 10/19/2020   Type 2 diabetes mellitus with cardiac complication ([place]) [id number]   Elevated troponin 10/08/2020   Acute on chronic heart failure with preserved ejection fraction (HFpEF) ([place]) 10/08/2020   COPD with acute exacerbation ([place]) 08/19/2020   History of non-ST elevation  myocardial infarction (NSTEMI) 08/07/2020   Asthma 08/07/2020   Leukocytosis 08/07/2020   Left shoulder pain 07/12/2020   Iron deficiency 04/16/2020   Anemia 04/04/2020   SOB (shortness of breath) 03/25/2020   Cough 09/30/2019   Gallstone pancreatitis    Pre-op evaluation 08/30/2019   Cholecystitis 08/05/2019   Choledocholithiasis    Respiratory illness 06/18/2019   Coronary artery disease of native heart with stable angina  pectoris (HCC) 12/29/2018   Chest pain 10/07/2018   Memory change 05/27/2018   Osteoporosis 02/05/2018   Weight loss 06/24/2017   Abdominal pain, left lower quadrant 10/05/2016   Long term current use of systemic steroids 05/16/2016   Elevated erythrocyte sedimentation rate 05/04/2016   Back pain 04/29/2016   Fatigue 05/24/2015   Health care maintenance 01/25/2015   UTI (urinary tract infection) 07/07/2014   Neuropathy 03/24/2014   Diverticulitis 02/24/2013   Reactive airway disease 09/15/2012   Hypertension 09/14/2012   Hypercholesterolemia 09/14/2012   Diabetes mellitus with cardiac complication (HCC) 09/14/2012    Past Surgical History:  Procedure Laterality Date   ABDOMINAL HYSTERECTOMY  1981   prolapse and bleeding, ovaries not removed   BREAST EXCISIONAL BIOPSY Right    CHOLECYSTECTOMY N/A 09/02/2019   Procedure: LAPAROSCOPIC CHOLECYSTECTOMY WITH INTRAOPERATIVE CHOLANGIOGRAM;  Surgeon: Henrene Dodge, MD;  Location: [facility] ORS;  Service: General;  Laterality: N/A;   CORONARY STENT INTERVENTION N/A 10/08/2018   Procedure: CORONARY STENT INTERVENTION;  Surgeon: Iran Ouch, MD;  Location: [facility] INVASIVE CV LAB;  Service: Cardiovascular;  Laterality: N/A;   ENDOSCOPIC RETROGRADE CHOLANGIOPANCREATOGRAPHY (ERCP) WITH PROPOFOL N/A 08/08/2019   Procedure: ENDOSCOPIC RETROGRADE CHOLANGIOPANCREATOGRAPHY (ERCP) WITH PROPOFOL;  Surgeon: Midge Minium, MD;  Location: [facility] ENDOSCOPY;  Service: Endoscopy;  Laterality: N/A;   LEFT HEART CATH AND CORONARY ANGIOGRAPHY N/A 10/08/2018   Procedure: LEFT HEART CATH AND CORONARY ANGIOGRAPHY;  Surgeon: Iran Ouch, MD;  Location: [facility] INVASIVE CV LAB;  Service: Cardiovascular;  Laterality: N/A;   LEFT HEART CATH AND CORONARY ANGIOGRAPHY N/A 08/10/2020   Procedure: LEFT HEART CATH AND CORONARY ANGIOGRAPHY possible percutaneous intervention;  Surgeon: Iran Ouch, MD;  Location: [facility] INVASIVE CV LAB;  Service: Cardiovascular;  Laterality:  N/A;   UMBILICAL HERNIA REPAIR  7/94    Prior to Admission medications   Medication Sig Start Date End Date Taking? Authorizing Provider  amoxicillin-clavulanate (AUGMENTIN XR) 1000-62.5 MG 12 hr tablet Take 1 tablet by mouth 2 (two) times daily for 7 days. 10/27/21 11/03/21 Yes Coyt Govoni, [place], MD  meclizine (ANTIVERT) 25 MG tablet Take 1 tablet (25 mg total) by mouth 3 (three) times daily as needed for dizziness. 10/27/21  Yes Anyae Griffith, [place], MD  alendronate (FOSAMAX) 70 MG tablet Take 70 mg by mouth once a week.  11/06/17   [provider]  aspirin 81 MG tablet Take 81 mg by mouth daily.    [provider]  atorvastatin (LIPITOR) 80 MG tablet TAKE 1 TABLET BY MOUTH ONCE DAILY AT  6PM 05/04/21   Alver Sorrow, NP  fluticasone [facility]) 50 MCG/ACT nasal spray Use 2 spray(s) in each nostril once daily 08/03/20   Dale Oildale, MD  fluticasone-salmeterol (ADVAIR) 250-50 MCG/ACT AEPB Inhale 1 puff into the lungs in the morning and at bedtime. 06/09/21   Dale Rushford, MD  ipratropium-albuterol (DUONEB) 0.5-2.5 (3) MG/3ML SOLN Take 3 mLs by nebulization 2 (two) times daily. 05/17/21   Kathlen Mody, MD  metFORMIN (GLUCOPHAGE) 500 MG tablet TAKE 1 TABLET BY MOUTH TWICE DAILY WITH A MEAL 10/01/21  Einar Pheasant, MD  metoprolol tartrate (LOPRESSOR) 25 MG tablet Take 1 tablet by mouth twice daily 05/11/21   Wellington Hampshire, MD  nitroGLYCERIN (NITROSTAT) 0.4 MG SL tablet Place 1 tablet (0.4 mg total) under the tongue every 5 (five) minutes as needed for chest pain. 10/09/18   Bettey Costa, MD  PROAIR HFA 108 (90 Base) MCG/ACT inhaler Inhale 2 puffs into the lungs every 4 (four) hours as needed for wheezing or shortness of breath. 08/20/20   Lavina Hamman, MD  Vitamin D, Ergocalciferol, (DRISDOL) 50000 units CAPS capsule Take 50,000 Units by mouth once a week. 06/24/18   [provider]    Allergies Tramadol  Family History  Problem Relation Age of Onset    Arthritis Mother    Heart disease Mother    Heart attack Father    Throat cancer Sister    Parkinson's disease Sister    COPD Brother     Social History Social History   Tobacco Use   Smoking status: Never   Smokeless tobacco: Never  Vaping Use   Vaping Use: Never used  Substance Use Topics   Alcohol use: No    Alcohol/week: 0.0 standard drinks   Drug use: No    Review of Systems  Constitutional: Negative for fever. Eyes: Negative for visual changes. ENT: Negative for sore throat. + sinus congestion Neck: No neck pain  Cardiovascular: Negative for chest pain. Respiratory: Negative for shortness of breath. Gastrointestinal: Negative for abdominal pain, vomiting or diarrhea. Genitourinary: Negative for dysuria. Musculoskeletal: Negative for back pain. Skin: Negative for rash. Neurological: Negative for headaches, weakness or numbness. + vertigo Psych: No SI or HI  ____________________________________________   PHYSICAL EXAM:  VITAL SIGNS: ED Triage Vitals  Enc Vitals Group     BP 10/26/21 1758 (!) 152/58     Pulse Rate 10/26/21 1758 65     Resp 10/26/21 1758 15     Temp 10/26/21 1758 99.1 F (37.3 C)     Temp Source 10/26/21 1758 Oral     SpO2 10/26/21 1758 94 %     Weight 10/26/21 1758 122 lb 9.2 oz (55.6 kg)     Height 10/26/21 1758 5\' 1"  (1.549 m)     Head Circumference --      Peak Flow --      Pain Score 10/26/21 1808 0     Pain Loc --      Pain Edu? --      Excl. in [place]? --     Constitutional: Alert and oriented. Well appearing and in no apparent distress. HEENT:      Head: Normocephalic and atraumatic.         Eyes: Conjunctivae are normal. Sclera is non-icteric.       Mouth/Throat: Mucous membranes are moist.       Neck: Supple with no signs of meningismus. Cardiovascular: Regular rate and rhythm. No murmurs, gallops, or rubs. 2+ symmetrical distal pulses are present in all extremities. No JVD. Respiratory: Normal respiratory effort. Lungs are  clear to auscultation bilaterally.  Gastrointestinal: Soft, non tender. Musculoskeletal:  No edema, cyanosis, or erythema of extremities. Neurologic: A & O x3, PERRL, EOMI, no nystagmus, CN II-XII intact, motor testing reveals good tone and bulk throughout. There is no evidence of pronator drift or dysmetria. Muscle strength is 5/5 throughout. Deep tendon reflexes are 2+ throughout with downgoing toes. Sensory examination is intact. Gait is normal. Skin: Skin is warm, dry and intact. No rash noted.  Psychiatric: Mood and affect are normal. Speech and behavior are normal.  ____________________________________________   LABS (all labs ordered are listed, but only abnormal results are displayed)  Labs Reviewed  BASIC METABOLIC PANEL - Abnormal; Notable for the following components:      Result Value   Glucose, Bld 170 (*)    All other components within normal limits  RESP PANEL BY RT-PCR (FLU A&B, COVID) ARPGX2  CBC  URINALYSIS, COMPLETE (UACMP) WITH MICROSCOPIC  CBG MONITORING, ED  TROPONIN I (HIGH SENSITIVITY)   ____________________________________________  EKG  ED ECG REPORT I, Rudene Re, the attending physician, personally viewed and interpreted this ECG.  Sinus rhythm with a rate of 76, occasional PVCs, normal intervals, normal axis, no ST elevations or depressions. ____________________________________________  RADIOLOGY  I have personally reviewed the images performed during this visit and I agree with the Radiologist's read.   Interpretation by Radiologist:  CT HEAD WO CONTRAST  Result Date: 10/26/2021 CLINICAL DATA:  Head trauma, minor. Dizziness, nonspecific. [place] a week ago. EXAM: CT HEAD WITHOUT CONTRAST TECHNIQUE: Contiguous axial images were obtained from the base of the skull through the vertex without intravenous contrast. COMPARISON:  09/15/2021 FINDINGS: Brain: Diffuse cerebral atrophy. Ventricular dilatation consistent with central atrophy.  Low-attenuation changes in the deep white matter consistent with small vessel ischemia. No abnormal extra-axial fluid collections. No mass effect or midline shift. Gray-white matter junctions are distinct. Basal cisterns are not effaced. No acute intracranial hemorrhage. Vascular: Moderate intracranial arterial vascular calcifications. Skull: Calvarium appears intact. Sinuses/Orbits: Mucosal thickening of the paranasal sinuses with opacification of ethmoid air cells, likely inflammatory. Mastoid air cells are clear. Other: None. IMPRESSION: No acute intracranial abnormalities. Chronic atrophy and small vessel ischemic changes. Inflammatory changes in the paranasal sinuses. Electronically Signed   By: Lucienne Capers M.D.   On: 10/26/2021 19:03   MR BRAIN WO CONTRAST  Result Date: 10/27/2021 CLINICAL DATA:  Nonspecific dizziness. EXAM: MRI HEAD WITHOUT CONTRAST TECHNIQUE: Multiplanar, multiecho pulse sequences of the brain and surrounding structures were obtained without intravenous contrast. COMPARISON:  Head CT from yesterday FINDINGS: Brain: No acute infarction, hemorrhage, hydrocephalus, extra-axial collection or mass lesion. Small chronic cerebellar infarcts bilaterally. Extensive chronic small vessel ischemic gliosis in the hemispheric white matter. Chronic lacunar infarct in the left caudate head. Vascular: Normal flow voids. Skull and upper cervical spine: Normal marrow signal Sinuses/Orbits: Generalized mucosal thickening in the paranasal sinuses, confluent in the left frontal sinus especially. This is chronic when compared to prior CT. No acute superimposed finding. No bony dehiscence by prior CT. IMPRESSION: 1. No acute or reversible finding. 2. Widespread chronic small vessel ischemia in the hemispheric white matter. Small chronic cerebellar infarcts. Electronically Signed   By: Jorje Guild M.D.   On: 10/27/2021 05:16      ____________________________________________   PROCEDURES  Procedure(s) performed:yes .1-3 Lead EKG Interpretation Performed by: Rudene Re, MD Authorized by: Rudene Re, MD     Interpretation: non-specific     ECG rate assessment: normal     Rhythm: sinus rhythm     Ectopy: PVCs     Conduction: normal     Critical Care performed: None ____________________________________________   INITIAL IMPRESSION / ASSESSMENT AND PLAN / ED COURSE  87 y.o. female with a history of diastolic CHF, CAD, diabetes, hypertension, hyperlipidemia, polymyalgia rheumatica, reactive airway disease, asthma who presents for evaluation of dizziness.  Patient with 3 episodes of room spinning sensation that were short-lived but started today.  Has had some sinus congestion  and recently had a head CT 2 weeks ago showing a chronic strokes that she was unaware she headed.  No neurological deficits.  She did have a fall last week with no head trauma.  Her symptoms are positional in nature.  She is not orthostatic.  Her EKG does not show any signs of dysrhythmias.  Differential diagnoses including peripheral vertigo versus stroke versus reactivation of an old stroke versus dysrhythmias.  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  We will check a UA to rule out a UTI and COVID and flu swab to rule out an infection which could be etiologies for reactivation of her old stroke.  Her labs show normal white count, no signs of anemia.  We will place patient on telemetry to monitor for any signs of dysrhythmias.  Head CT done today does not show any signs of brain bleed or new acute stroke.  It does show paranasal sinus infection.  We will get an MRI to rule out a posterior fossa stroke.  Will give IV fluids, meclizine, and reassess patient.  History is gathered from patient and her  daughter who was at bedside.  _________________________ 6:56 AM on 10/27/2021 ----------------------------------------- COVID and flu negative.  MRI with no signs of acute stroke.  Patient required IV Ativan for the MRI as her vertigo, nausea and vomiting gotten severely worse when she laid flat.  Patient is very sleepy from the Ativan therefore cannot be ambulated safely right now.  With a old cerebellar stroke this could be reactivation of stroke symptoms in the setting of a UTI for the UA is positive.  Otherwise possibly peripheral vertigo in the setting of sinusitis.  We will treat with Augmentin, sinus rinses and provide a prescription for meclizine.  I did put a referral for ENT.  Discussed my standard return precautions with patient and patient's daughter.  Care transferred to incoming MD at 7 AM      _____________________________________________ Please note:  Patient was evaluated in Emergency Department today for the symptoms described in the history of present illness. Patient was evaluated in the context of the global COVID-19 pandemic, which necessitated consideration that the patient might be at risk for infection with the SARS-CoV-2 virus that causes COVID-19. Institutional protocols and algorithms that pertain to the evaluation of patients at risk for COVID-19 are in a state of rapid change based on information released by regulatory bodies including the CDC and federal and state organizations. These policies and algorithms were followed during the patient's care in the ED.  Some ED evaluations and interventions may be delayed as a result of limited staffing during the pandemic.   [place] Controlled Substance Database was reviewed by me. ____________________________________________   FINAL CLINICAL IMPRESSION(S) / ED DIAGNOSES   Final diagnoses:  Vertigo  Acute sinusitis, recurrence not specified, unspecified location      NEW MEDICATIONS STARTED DURING THIS VISIT:  ED  Discharge Orders          Ordered    meclizine (ANTIVERT) 25 MG tablet  3 times daily PRN        10/27/21 0655    amoxicillin-clavulanate (AUGMENTIN XR) 1000-62.5 MG 12 hr tablet  2 times daily        10/27/21 0655    Ambulatory referral to ENT       Comments: vertigo   10/27/21  0656             Note:  This document was prepared using Dragon voice recognition software and may include unintentional dictation errors.    Rudene Re, MD 10/27/21 [id number]

## 2021-10-27 NOTE — ED Notes (Signed)
Sig pad not working. D/c instructions explained to pt and family, verbalizes understanding. Denies questions or concerns. Taken to ride in w/c. Alert and oriented, NAD noted

## 2021-10-29 NOTE — Telephone Encounter (Signed)
FYI-  Patient needing ED f/u. Daughter requested late PM appt 2:30-3:30 only. Sch for 1/30 at 2:30. Advised that we can see her sooner, just unable to see her late PM. Confirmed pt doing ok starting to feel better. Daughter advised that they would call back if they felt appt needed to be moved up

## 2021-10-30 DIAGNOSIS — E1159 Type 2 diabetes mellitus with other circulatory complications: Secondary | ICD-10-CM | POA: Diagnosis not present

## 2021-10-30 DIAGNOSIS — I10 Essential (primary) hypertension: Secondary | ICD-10-CM

## 2021-10-30 DIAGNOSIS — I5032 Chronic diastolic (congestive) heart failure: Secondary | ICD-10-CM

## 2021-10-31 ENCOUNTER — Encounter: Payer: Self-pay | Admitting: Internal Medicine

## 2021-10-31 ENCOUNTER — Emergency Department: Payer: Medicare HMO

## 2021-10-31 ENCOUNTER — Observation Stay
Admission: EM | Admit: 2021-10-31 | Discharge: 2021-11-01 | Disposition: A | Discharge status: Home / Self Care | Payer: Medicare HMO | Attending: Internal Medicine | Admitting: Internal Medicine

## 2021-10-31 ENCOUNTER — Encounter: Payer: Self-pay | Admitting: Emergency Medicine

## 2021-10-31 ENCOUNTER — Other Ambulatory Visit: Payer: Self-pay

## 2021-10-31 DIAGNOSIS — E1159 Type 2 diabetes mellitus with other circulatory complications: Secondary | ICD-10-CM | POA: Diagnosis not present

## 2021-10-31 DIAGNOSIS — Z20822 Contact with and (suspected) exposure to covid-19: Secondary | ICD-10-CM | POA: Insufficient documentation

## 2021-10-31 DIAGNOSIS — D72829 Elevated white blood cell count, unspecified: Secondary | ICD-10-CM | POA: Diagnosis not present

## 2021-10-31 DIAGNOSIS — I5030 Unspecified diastolic (congestive) heart failure: Secondary | ICD-10-CM | POA: Insufficient documentation

## 2021-10-31 DIAGNOSIS — M353 Polymyalgia rheumatica: Secondary | ICD-10-CM | POA: Diagnosis present

## 2021-10-31 DIAGNOSIS — M810 Age-related osteoporosis without current pathological fracture: Secondary | ICD-10-CM | POA: Diagnosis not present

## 2021-10-31 DIAGNOSIS — I10 Essential (primary) hypertension: Secondary | ICD-10-CM

## 2021-10-31 DIAGNOSIS — M130 Polyarthritis, unspecified: Secondary | ICD-10-CM | POA: Diagnosis not present

## 2021-10-31 DIAGNOSIS — J984 Other disorders of lung: Secondary | ICD-10-CM | POA: Diagnosis not present

## 2021-10-31 DIAGNOSIS — R600 Localized edema: Secondary | ICD-10-CM | POA: Diagnosis not present

## 2021-10-31 DIAGNOSIS — E119 Type 2 diabetes mellitus without complications: Secondary | ICD-10-CM | POA: Diagnosis present

## 2021-10-31 DIAGNOSIS — M25521 Pain in right elbow: Secondary | ICD-10-CM | POA: Diagnosis not present

## 2021-10-31 DIAGNOSIS — J449 Chronic obstructive pulmonary disease, unspecified: Secondary | ICD-10-CM

## 2021-10-31 DIAGNOSIS — J45909 Unspecified asthma, uncomplicated: Secondary | ICD-10-CM | POA: Insufficient documentation

## 2021-10-31 DIAGNOSIS — I2510 Atherosclerotic heart disease of native coronary artery without angina pectoris: Secondary | ICD-10-CM | POA: Insufficient documentation

## 2021-10-31 DIAGNOSIS — M25531 Pain in right wrist: Secondary | ICD-10-CM | POA: Diagnosis not present

## 2021-10-31 DIAGNOSIS — Z955 Presence of coronary angioplasty implant and graft: Secondary | ICD-10-CM | POA: Insufficient documentation

## 2021-10-31 DIAGNOSIS — I25118 Atherosclerotic heart disease of native coronary artery with other forms of angina pectoris: Secondary | ICD-10-CM | POA: Diagnosis not present

## 2021-10-31 DIAGNOSIS — M19019 Primary osteoarthritis, unspecified shoulder: Secondary | ICD-10-CM

## 2021-10-31 DIAGNOSIS — Z7409 Other reduced mobility: Secondary | ICD-10-CM | POA: Insufficient documentation

## 2021-10-31 DIAGNOSIS — I700 Atherosclerosis of aorta: Secondary | ICD-10-CM | POA: Diagnosis not present

## 2021-10-31 DIAGNOSIS — M25511 Pain in right shoulder: Secondary | ICD-10-CM | POA: Diagnosis present

## 2021-10-31 HISTORY — DX: Sepsis, unspecified organism: A41.9

## 2021-10-31 HISTORY — DX: Severe sepsis without septic shock: R65.20

## 2021-10-31 LAB — CBC
HCT: 37.8 % (ref 36.0–46.0)
Hemoglobin: 12.2 g/dL (ref 12.0–15.0)
MCH: 27.9 pg (ref 26.0–34.0)
MCHC: 32.3 g/dL (ref 30.0–36.0)
MCV: 86.5 fL (ref 80.0–100.0)
Platelets: 299 10*3/uL (ref 150–400)
RBC: 4.37 MIL/uL (ref 3.87–5.11)
RDW: 13.2 % (ref 11.5–15.5)
WBC: 12.9 10*3/uL — ABNORMAL HIGH (ref 4.0–10.5)
nRBC: 0 % (ref 0.0–0.2)

## 2021-10-31 LAB — BRAIN NATRIURETIC PEPTIDE: B Natriuretic Peptide: 210.4 pg/mL — ABNORMAL HIGH (ref 0.0–100.0)

## 2021-10-31 LAB — HEPATIC FUNCTION PANEL
ALT: 9 U/L (ref 0–44)
AST: 17 U/L (ref 15–41)
Albumin: 3.2 g/dL — ABNORMAL LOW (ref 3.5–5.0)
Alkaline Phosphatase: 68 U/L (ref 38–126)
Bilirubin, Direct: 0.2 mg/dL (ref 0.0–0.2)
Indirect Bilirubin: 0.5 mg/dL (ref 0.3–0.9)
Total Bilirubin: 0.7 mg/dL (ref 0.3–1.2)
Total Protein: 6.7 g/dL (ref 6.5–8.1)

## 2021-10-31 LAB — BASIC METABOLIC PANEL WITH GFR
Anion gap: 9 (ref 5–15)
BUN: 15 mg/dL (ref 8–23)
CO2: 26 mmol/L (ref 22–32)
Calcium: 8.4 mg/dL — ABNORMAL LOW (ref 8.9–10.3)
Chloride: 100 mmol/L (ref 98–111)
Creatinine, Ser: 0.77 mg/dL (ref 0.44–1.00)
GFR, Estimated: >60 mL/min (ref >60)
Glucose, Bld: 189 mg/dL — ABNORMAL HIGH (ref 70–99)
Potassium: 3.6 mmol/L (ref 3.5–5.1)
Sodium: 135 mmol/L (ref 135–145)

## 2021-10-31 LAB — PROCALCITONIN: Procalcitonin: <0.1 ng/mL

## 2021-10-31 LAB — SEDIMENTATION RATE: Sed Rate: 51 mm/h — ABNORMAL HIGH (ref 0–30)

## 2021-10-31 LAB — LIPASE, BLOOD: Lipase: 26 U/L (ref 11–51)

## 2021-10-31 LAB — RESP PANEL BY RT-PCR (FLU A&B, COVID) ARPGX2
Influenza A by PCR: NEGATIVE
Influenza B by PCR: NEGATIVE
SARS Coronavirus 2 by RT PCR: NEGATIVE

## 2021-10-31 LAB — TROPONIN I (HIGH SENSITIVITY)
Troponin I (High Sensitivity): 9 ng/L (ref <18)
Troponin I (High Sensitivity): 8 ng/L (ref <18)

## 2021-10-31 LAB — C-REACTIVE PROTEIN: CRP: 18.5 mg/dL — ABNORMAL HIGH (ref <1.0)

## 2021-10-31 LAB — HEMOGLOBIN A1C
Hgb A1c MFr Bld: 6.6 % — ABNORMAL HIGH (ref 4.8–5.6)
Mean Plasma Glucose: 142.72 mg/dL

## 2021-10-31 MED ORDER — SODIUM CHLORIDE 0.9 % IV SOLN: 250.0000 mL | INTRAVENOUS | Status: DC | PRN

## 2021-10-31 MED ORDER — MORPHINE SULFATE (PF) 2 MG/ML IV SOLN
2.0000 mg | Freq: Once | INTRAVENOUS | Status: AC
  Administered 2021-10-31: 2 mg via INTRAVENOUS
  Filled 2021-10-31: qty 1

## 2021-10-31 MED ORDER — FLUTICASONE PROPIONATE 50 MCG/ACT NA SUSP: 1.0000 | Freq: Every day | NASAL | Status: DC

## 2021-10-31 MED ORDER — SODIUM CHLORIDE 0.9% FLUSH
3.0000 mL | Freq: Two times a day (BID) | INTRAVENOUS | Status: DC
  Administered 2021-10-31 – 2021-11-01 (×3): 3 mL via INTRAVENOUS

## 2021-10-31 MED ORDER — METHYLPREDNISOLONE SODIUM SUCC 125 MG IJ SOLR
60.0000 mg | Freq: Once | INTRAMUSCULAR | Status: AC
  Administered 2021-10-31: 60 mg via INTRAVENOUS
  Filled 2021-10-31: qty 2

## 2021-10-31 MED ORDER — ENOXAPARIN SODIUM 40 MG/0.4ML IJ SOSY
40.0000 mg | PREFILLED_SYRINGE | INTRAMUSCULAR | Status: DC
  Administered 2021-10-31: 40 mg via SUBCUTANEOUS
  Filled 2021-10-31: qty 0.4

## 2021-10-31 MED ORDER — MECLIZINE HCL 25 MG PO TABS
25.0000 mg | ORAL_TABLET | Freq: Three times a day (TID) | ORAL | Status: DC | PRN
  Filled 2021-10-31: qty 1

## 2021-10-31 MED ORDER — ONDANSETRON HCL 4 MG/2ML IJ SOLN
4.0000 mg | Freq: Once | INTRAMUSCULAR | Status: AC
  Administered 2021-10-31: 4 mg via INTRAVENOUS
  Filled 2021-10-31: qty 2

## 2021-10-31 MED ORDER — SODIUM CHLORIDE 0.9% FLUSH: 3.0000 mL | INTRAVENOUS | Status: DC | PRN

## 2021-10-31 MED ORDER — METOPROLOL TARTRATE 25 MG PO TABS
25.0000 mg | ORAL_TABLET | Freq: Two times a day (BID) | ORAL | Status: DC
  Administered 2021-10-31 – 2021-11-01 (×3): 25 mg via ORAL
  Filled 2021-10-31 (×3): qty 1

## 2021-10-31 MED ORDER — OXYCODONE HCL 5 MG PO TABS: 10.0000 mg | ORAL_TABLET | ORAL | Status: DC | PRN

## 2021-10-31 MED ORDER — ONDANSETRON 8 MG PO TBDP
8.0000 mg | ORAL_TABLET | Freq: Three times a day (TID) | ORAL | Status: DC | PRN
  Filled 2021-10-31: qty 1

## 2021-10-31 MED ORDER — FLUTICASONE PROPIONATE 50 MCG/ACT NA SUSP
1.0000 | Freq: Every day | NASAL | Status: DC | PRN
  Filled 2021-10-31: qty 16

## 2021-10-31 MED ORDER — ALBUTEROL SULFATE HFA 108 (90 BASE) MCG/ACT IN AERS: 2.0000 | INHALATION_SPRAY | RESPIRATORY_TRACT | Status: DC | PRN

## 2021-10-31 MED ORDER — VITAMIN D (ERGOCALCIFEROL) 1.25 MG (50000 UNIT) PO CAPS
50000.0000 [IU] | ORAL_CAPSULE | ORAL | Status: DC
  Administered 2021-11-01: 50000 [IU] via ORAL
  Filled 2021-10-31: qty 1

## 2021-10-31 MED ORDER — DIPHENHYDRAMINE HCL 50 MG/ML IJ SOLN: 12.5000 mg | Freq: Four times a day (QID) | INTRAMUSCULAR | Status: DC | PRN

## 2021-10-31 MED ORDER — KETOROLAC TROMETHAMINE 30 MG/ML IJ SOLN
15.0000 mg | Freq: Once | INTRAMUSCULAR | Status: AC
  Administered 2021-10-31: 15 mg via INTRAVENOUS
  Filled 2021-10-31: qty 1

## 2021-10-31 MED ORDER — OXYCODONE HCL 5 MG PO TABS: 5.0000 mg | ORAL_TABLET | ORAL | Status: DC | PRN

## 2021-10-31 MED ORDER — ATORVASTATIN CALCIUM 20 MG PO TABS
80.0000 mg | ORAL_TABLET | Freq: Every evening | ORAL | Status: DC
  Administered 2021-10-31: 80 mg via ORAL
  Filled 2021-10-31: qty 4

## 2021-10-31 MED ORDER — IPRATROPIUM-ALBUTEROL 0.5-2.5 (3) MG/3ML IN SOLN: 3.0000 mL | Freq: Four times a day (QID) | RESPIRATORY_TRACT | Status: DC | PRN

## 2021-10-31 MED ORDER — AMOXICILLIN-POT CLAVULANATE ER 1000-62.5 MG PO TB12
1.0000 | ORAL_TABLET | Freq: Two times a day (BID) | ORAL | Status: DC
  Administered 2021-10-31 – 2021-11-01 (×3): 1 via ORAL
  Filled 2021-10-31 (×6): qty 1

## 2021-10-31 MED ORDER — FLUTICASONE FUROATE-VILANTEROL 200-25 MCG/ACT IN AEPB: 1.0000 | INHALATION_SPRAY | Freq: Every day | RESPIRATORY_TRACT | Status: DC

## 2021-10-31 MED ORDER — FLUTICASONE FUROATE-VILANTEROL 200-25 MCG/ACT IN AEPB
1.0000 | INHALATION_SPRAY | Freq: Every day | RESPIRATORY_TRACT | Status: DC
  Administered 2021-11-01: 1 via RESPIRATORY_TRACT
  Filled 2021-10-31: qty 28

## 2021-10-31 NOTE — Assessment & Plan Note (Addendum)
On metformin only at home, resume

## 2021-10-31 NOTE — Assessment & Plan Note (Addendum)
Stable, continue home inhalers  continue home Augmentin

## 2021-10-31 NOTE — ED Notes (Signed)
Pt up to toilet at this time. ?

## 2021-10-31 NOTE — Assessment & Plan Note (Deleted)
Low suspicion for cellulitis though skin is showing some erythema at R elbow/wirst and joints are tender on movement no apparent effusion or abscess. Would have low threshold for MRI/ortho consult if worsening. WBC slight elevation, procalcitonin negative. Place in obs for pain control and await labs to eval for possible inflammatory/rheumatologic cause for polyarthralgia (Ddx would include new onset RA as she does have family history of this, other possibilities included but not limited to Gout, SLE, Reactive Arthritis. She denies rashes, mucus membrane sores/ulcers, fever/chills, recent travel or exposures to unusual foods/animals), pt has received steroid dose in ED and if this helps would also lean more toward inflammatory arthritis. Patient and family are concerned about LE "blowing up" with edema but exam is not concerning for CHF. BNP pending.

## 2021-10-31 NOTE — Assessment & Plan Note (Addendum)
Continue home statin, beta blocker.

## 2021-10-31 NOTE — ED Triage Notes (Signed)
Pt via POV from home. Pt is accompanied by daughter, pt has multiple complaints. Pt c/o R shoulder, arm, and wrist pain. No recent injury to that arm that started 4 days ago. Pt also bilateral legs are swelling. Pt has hx of CHF but has been taking off her fluid pills. 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 Daughter also states pt has been nauseous this AM. Denies SOB. Denies CP. Pt is A&Ox4 and NAD

## 2021-10-31 NOTE — ED Notes (Signed)
Pt states some pain to right arm. Pt has some redness and little warmth noted to right elbow. Family also states swelling to wrist and hand area. Pt also states some bilateral ankle swelling. Pt is not on any fluid pills at this time. Pt also c/o right lower pain. Family states pt has fatty tumor. Pt states it is all just part of getting old.

## 2021-10-31 NOTE — Assessment & Plan Note (Addendum)
BP stable, continue home meds,

## 2021-10-31 NOTE — ED Notes (Signed)
Assisted to toilet. Then back to bed.

## 2021-10-31 NOTE — H&P (Signed)
[place] Strider is an 88 y.o. female.   Chief Complaint:  Chief Complaint  Patient presents with   Leg Swelling   Abdominal Pain   Arm Pain    HPI:  Here today with her 2 daughters. Reports 3 days increasing pain in R shoudler, elbow, wrist w/ swelling/redness in elbow/wrist and concern for LE edema given hx CAD/stents in heart. Unable to walk at home d/t pain. No rash, no mucus membrane ulceration, no new meds other than augmentin for sinusitis, started around time of symptom onset    ED course: Investigations: Neg Flu/COVID, CBC slight leukocytosis at 12.9, Procalcitonin WNL, Tropes flax x2 and WNL, BNP elevated above baseline to 210 but had non-concerning echocardiogram 05/2021, (+)ESR to 51, Normal Lipase, CXR peribronchial cuffing concerning for bronchitis, aortic atherosclerosis, XR R wrist and elbow both negative  PENDING AS OF TIME OF ADMISSION:  Uric acid CRP Blood cultures  Echocardiogram  Hepatic panel, Heb BsAg, Hep C ANA, RF, anti-Sm, anti-CCP HIV, RPR A1C Echo          ASSESSMENT/PLAN:   Polyarthritis involving R elbow, R wrist, R shoulder, bilateral legs  Low suspicion for cellulitis though skin is showing some erythema at R elbow/wirst and joints are tender on movement no apparent effusion or abscess. (Ddx would include new onset RA as she does have family history of this, other possibilities included but not limited to Gout, SLE, Reactive Arthritis. She denies rashes, mucus membrane sores/ulcers, fever/chills, recent travel or exposures to unusual foods/animals) WBC slight elevation, procalcitonin negative.  Recently started on augmentin for sinusitis right around time these MSK symptoms began, rash is not consistent w/ erythema multiforme. SJS or TEN, and MSK pain is not a listed adverse effect of this Rx .  (+)FH RA in mother  Would have low threshold for MRI/ortho consult if worsening.  Place in obs for pain control and await labs to eval for possible  inflammatory/rheumatologic cause for polyarthralgia  pt has received steroid dose in ED and if this helps would also lean more toward inflammatory arthritis. Opiates also ordered prn  PT/OT to evaluate.  Patient and family are concerned about LE "blowing up" with edema but exam is not concerning for CHF. BNP slightly elevated but echo back in 05/2021 was non concerning, will repeat echo.   Coronary artery disease of native heart with stable angina pectoris ([place]) Continue home statin, beta blocker. Holding ASA while on Rx VTE PPx. Holding nitroglycerin - if CP would assess  COPD (chronic obstructive pulmonary disease) (HCC) Stable, continue home inhalers (substituting Breo while inpatient) and ordered albuterol inhaler / duoneb prn. Recent antibiotic treatment for sinusitis which she states has improved but not resolved, continue home Augmentin   Diabetes mellitus with cardiac complication (HCC) On metformin only at home, hold while here and awaiting A1C  Osteoporosis Hold home Fosamax for now, continue vitamin D  Hypertension BP stable, continue home meds, not on ACE/ARB at home but is on BB for hx CAD, would not add ACE/ARB now d/t BP well controlled on lower side but not hypotensive    VTE Ppx: lovenox CODE: DNR confirmed w/ patient and w/ 2 daughters  DISPO: may be able to go home w/ [facility] but pending PT eval may need rehab SNF     Past Medical History:  Diagnosis Date   (HFpEF) heart failure with preserved ejection fraction ([place])    a. TTE 12/19: EF 55-60%, probable HK of the mid apical anterior septal myocardium, Gr1DD,  mild AI, mildly dilated LA; b.07/2020 Echo: EF 60-65%, no rwma, Gr2 DD. Nl RV fxn. Mildly dil LA. Mild MR.   Asthma    CAD (coronary artery disease)    a. 09/2018 NSTEMI/PCI: LM min irregs, mLAD 95 (PCI/DES), mLAD-2 60%, LCx mild diff dzs, RCA min irregs; b. 03/2020 MV: EF>65%, no ischemia/scar; c. 07/2020 Cath: LM min irregs, LAD 30p, 100m 90d, D1/2 min irregs, LCX  diff dzs throughout, OM1/2/3 mild dzs, RCA 30p, RPDA/RPAV min irrges. EF 55-65%.   CHF (congestive heart failure) ([place]    Diabetes mellitus ([place]    Hypercholesterolemia    Hypertension    Myocardial infarction ([place]    Osteopenia    Palpitations    a. 04/2020 Zio: Avg HR 75. 429 SVT episodes, longest 19 secs @ 133. Occas PACs (3.2%). Rare PVCs (<1%).   Polymyalgia rheumatica syndrome (HCC)    Reactive airway disease    Severe sepsis ([place]     Past Surgical History:  Procedure Laterality Date   ABDOMINAL HYSTERECTOMY  1981   prolapse and bleeding, ovaries not removed   BREAST EXCISIONAL BIOPSY Right    CHOLECYSTECTOMY N/A 09/02/2019   Procedure: LAPAROSCOPIC CHOLECYSTECTOMY WITH INTRAOPERATIVE CHOLANGIOGRAM;  Surgeon: POlean Ree MD;  Location: [facility];  Service: General;  Laterality: N/A;   CORONARY STENT INTERVENTION N/A 10/08/2018   Procedure: CORONARY STENT INTERVENTION;  Surgeon: AWellington Hampshire MD;  [place] [facility];  Service: Cardiovascular;  Laterality: N/A;   ENDOSCOPIC RETROGRADE CHOLANGIOPANCREATOGRAPHY (ERCP) WITH PROPOFOL N/A 08/08/2019   Procedure: ENDOSCOPIC RETROGRADE CHOLANGIOPANCREATOGRAPHY (ERCP) WITH PROPOFOL;  Surgeon: WLucilla Lame MD;  Location: [facility];  Service: Endoscopy;  Laterality: N/A;   LEFT HEART CATH AND CORONARY ANGIOGRAPHY N/A 10/08/2018   Procedure: LEFT HEART CATH AND CORONARY ANGIOGRAPHY;  Surgeon: AWellington Hampshire MD;  [place] [facility];  Service: Cardiovascular;  Laterality: N/A;   LEFT HEART CATH AND CORONARY ANGIOGRAPHY N/A 08/10/2020   Procedure: LEFT HEART CATH AND CORONARY ANGIOGRAPHY possible percutaneous intervention;  Surgeon: AWellington Hampshire MD;  [place] [facility];  Service: Cardiovascular;  Laterality: N/A;   UMBILICAL HERNIA REPAIR  7/94    Family History  Problem Relation Age of Onset   Arthritis Mother    Heart disease Mother    Heart attack Father    Throat cancer Sister     Parkinson's disease Sister    COPD Brother    Social History:  reports that she has never smoked. She has never used smokeless tobacco. She reports that she does not drink alcohol and does not use drugs.  Allergies:  Allergies  Allergen Reactions   Tramadol Itching and Nausea And Vomiting    (Not in a hospital admission)   Results for orders placed or performed during the hospital encounter of 10/31/21 (from the past 48 hour(s))  Procalcitonin - Baseline     Status: None   Collection Time: 10/31/21  9:24 AM  Result Value Ref Range   Procalcitonin <0.10 ng/mL    Comment:        Interpretation: PCT (Procalcitonin) <= 0.5 ng/mL: Systemic infection (sepsis) is not likely. Local bacterial infection is possible. (NOTE)       Sepsis PCT Algorithm           Lower Respiratory Tract  Infection PCT Algorithm    ----------------------------     ----------------------------         PCT < 0.25 ng/mL                PCT < 0.10 ng/mL          Strongly encourage             Strongly discourage   discontinuation of antibiotics    initiation of antibiotics    ----------------------------     -----------------------------       PCT 0.25 - 0.50 ng/mL            PCT 0.10 - 0.25 ng/mL               OR       >80% decrease in PCT            Discourage initiation of                                            antibiotics      Encourage discontinuation           of antibiotics    ----------------------------     -----------------------------         PCT >= 0.50 ng/mL              PCT 0.26 - 0.50 ng/mL               AND        <80% decrease in PCT             Encourage initiation of                                             antibiotics       Encourage continuation           of antibiotics    ----------------------------     -----------------------------        PCT >= 0.50 ng/mL                  PCT > 0.50 ng/mL               AND         increase in PCT                   Strongly encourage                                      initiation of antibiotics    Strongly encourage escalation           of antibiotics                                     -----------------------------                                           PCT <= 0.25 ng/mL  OR                                        > 80% decrease in PCT                                      Discontinue / Do not initiate                                             antibiotics  Performed at [facility], [address] [place], [place] [address]   Basic metabolic panel     Status: Abnormal   Collection Time: 10/31/21  9:31 AM  Result Value Ref Range   Sodium 135 135 - 145 mmol/L   Potassium 3.6 3.5 - 5.1 mmol/L   Chloride 100 98 - 111 mmol/L   CO2 26 22 - 32 mmol/L   Glucose, Bld 189 (H) 70 - 99 mg/dL    Comment: Glucose reference range applies only to samples taken after fasting for at least 8 hours.   BUN 15 8 - 23 mg/dL   Creatinine, Ser 0.77 0.44 - 1.00 mg/dL   Calcium 8.4 (L) 8.9 - 10.3 mg/dL   GFR, Estimated >60 >60 mL/min    Comment: (NOTE) Calculated using the CKD-EPI Creatinine Equation (2021)    Anion gap 9 5 - 15    Comment: Performed at [facility], [address] [place], [place] [address]  CBC     Status: Abnormal   Collection Time: 10/31/21  9:31 AM  Result Value Ref Range   WBC 12.9 (H) 4.0 - 10.5 K/uL   RBC 4.37 3.87 - 5.11 MIL/uL   Hemoglobin 12.2 12.0 - 15.0 g/dL   HCT 37.8 36.0 - 46.0 %   MCV 86.5 80.0 - 100.0 fL   MCH 27.9 26.0 - 34.0 pg   MCHC 32.3 30.0 - 36.0 g/dL   RDW 13.2 11.5 - 15.5 %   Platelets 299 150 - 400 K/uL   nRBC 0.0 0.0 - 0.2 %    Comment: Performed at [facility], [place], [place] [address]  Troponin I (High Sensitivity)     Status: None   Collection Time: 10/31/21  9:31 AM  Result Value Ref Range   Troponin I (High Sensitivity) 9 <18 ng/L     Comment: (NOTE) Elevated high sensitivity troponin I (hsTnI) values and significant  changes across serial measurements may suggest ACS but many other  chronic and acute conditions are known to elevate hsTnI results.  Refer to the "Links" section for chest pain algorithms and additional  guidance. Performed at [facility], [address] [place], [place] [address]   Brain natriuretic peptide     Status: Abnormal   Collection Time: 10/31/21  9:31 AM  Result Value Ref Range   B Natriuretic Peptide 210.4 (H) 0.0 - 100.0 pg/mL    Comment: Performed at [facility] [place], [address] [place], [place] [address]  Lipase, blood     Status: None   Collection Time: 10/31/21  9:31 AM  Result Value Ref Range   Lipase 26 11 - 51 U/L    Comment: Performed  at [facility], [place], [place] [address]  Troponin I (High Sensitivity)     Status: None   Collection Time: 10/31/21 11:24 AM  Result Value Ref Range   Troponin I (High Sensitivity) 8 <18 ng/L    Comment: (NOTE) Elevated high sensitivity troponin I (hsTnI) values and significant  changes across serial measurements may suggest ACS but many other  chronic and acute conditions are known to elevate hsTnI results.  Refer to the "Links" section for chest pain algorithms and additional  guidance. Performed at [facility], [address] [place], [place] [address]   Sedimentation rate     Status: Abnormal   Collection Time: 10/31/21 11:35 AM  Result Value Ref Range   Sed Rate 51 (H) 0 - 30 mm/hr    Comment: Performed at [facility], [address] [place], [place] [address]  Resp Panel by RT-PCR (Flu A&B, Covid) Nasopharyngeal Swab     Status: None   Collection Time: 10/31/21 11:35 AM   Specimen: Nasopharyngeal Swab; Nasopharyngeal(NP) swabs in vial transport medium  Result Value Ref Range   SARS Coronavirus 2 by RT PCR NEGATIVE NEGATIVE    Comment: (NOTE) SARS-CoV-2  target nucleic acids are NOT DETECTED.  The SARS-CoV-2 RNA is generally detectable in upper respiratory specimens during the acute phase of infection. The lowest concentration of SARS-CoV-2 viral copies this assay can detect is 138 copies/mL. A negative result does not preclude SARS-Cov-2 infection and should not be used as the sole basis for treatment or other patient management decisions. A negative result may occur with  improper specimen collection/handling, submission of specimen other than nasopharyngeal swab, presence of viral mutation(s) within the areas targeted by this assay, and inadequate number of viral copies(<138 copies/mL). A negative result must be combined with clinical observations, patient history, and epidemiological information. The expected result is Negative.  Fact Sheet for Patients:  EntrepreneurPulse.com.au  Fact Sheet for Healthcare Providers:  IncredibleEmployment.be  This test is no t yet approved or cleared by the [place] FDA and  has been authorized for detection and/or diagnosis of SARS-CoV-2 by FDA under an Emergency Use Authorization (EUA). This EUA will remain  in effect (meaning this test can be used) for the duration of the COVID-19 declaration under Section 564(b)(1) of the Act, 21 U.S.C.section 360bbb-3(b)(1), unless the authorization is terminated  or revoked sooner.       Influenza A by PCR NEGATIVE NEGATIVE   Influenza B by PCR NEGATIVE NEGATIVE    Comment: (NOTE) The Xpert Xpress SARS-CoV-2/FLU/RSV plus assay is intended as an aid in the diagnosis of influenza from Nasopharyngeal swab specimens and should not be used as a sole basis for treatment. Nasal washings and aspirates are unacceptable for Xpert Xpress SARS-CoV-2/FLU/RSV testing.  Fact Sheet for Patients: EntrepreneurPulse.com.au  Fact Sheet for Healthcare Providers: IncredibleEmployment.be  This  test is not yet approved or cleared by the [place] FDA and has been authorized for detection and/or diagnosis of SARS-CoV-2 by FDA under an Emergency Use Authorization (EUA). This EUA will remain in effect (meaning this test can be used) for the duration of the COVID-19 declaration under Section 564(b)(1) of the Act, 21 U.S.C. section 360bbb-3(b)(1), unless the authorization is terminated or revoked.  Performed at [facility], [address] [place], [place] [address]    DG Elbow Complete Right  Result Date: 10/31/2021 CLINICAL DATA:  Acute right elbow pain without known injury. EXAM: RIGHT ELBOW - COMPLETE 3+  VIEW COMPARISON:  None. FINDINGS: There is no evidence of fracture, dislocation, or joint effusion. There is no evidence of arthropathy or other focal bone abnormality. Soft tissues are unremarkable. IMPRESSION: Negative. Electronically Signed   By: Marijo Conception M.D.   On: 10/31/2021 12:08   DG Wrist Complete Right  Result Date: 10/31/2021 CLINICAL DATA:  Acute right wrist pain without known injury. EXAM: RIGHT WRIST - COMPLETE 3+ VIEW COMPARISON:  None. FINDINGS: There is no evidence of fracture or dislocation. There is no evidence of arthropathy or other focal bone abnormality. Soft tissues are unremarkable. IMPRESSION: Negative. Electronically Signed   By: Marijo Conception M.D.   On: 10/31/2021 12:06   DG Chest Portable 1 View  Result Date: 10/31/2021 CLINICAL DATA:  88 year old female with history of shortness of breath. EXAM: PORTABLE CHEST 1 VIEW COMPARISON:  Chest x-ray 09/15/2021. FINDINGS: Lung volumes are normal. Diffuse peribronchial cuffing. Mild scarring at the left lung base, similar to the prior study. No consolidative airspace disease. No pleural effusions. No pneumothorax. No pulmonary nodule or mass noted. Pulmonary vasculature and the cardiomediastinal silhouette are within normal limits. Atherosclerosis in the thoracic aorta. IMPRESSION: 1. Diffuse  peribronchial cuffing, concerning for an acute bronchitis. 2. Aortic atherosclerosis. Electronically Signed   By: Vinnie Langton M.D.   On: 10/31/2021 12:33    Review of Systems  Constitutional:  Negative for activity change, chills, diaphoresis, fatigue, fever and unexpected weight change.  HENT:  Positive for congestion (w/ recent abx for sinusitis, Rx has improved symptoms). Negative for facial swelling, mouth sores, nosebleeds, sore throat, trouble swallowing and voice change.   Eyes:  Negative for pain.  Respiratory:  Negative for cough, chest tightness and shortness of breath.   Cardiovascular:  Positive for leg swelling. Negative for chest pain and palpitations.  Gastrointestinal:  Positive for abdominal pain. Negative for blood in stool, constipation and diarrhea.  Endocrine: Negative for cold intolerance and heat intolerance.  Genitourinary:  Negative for dysuria and frequency.  Musculoskeletal:  Positive for arthralgias, gait problem, joint swelling and myalgias. Negative for neck pain and neck stiffness.  Skin:  Positive for color change. Negative for rash and wound.  Neurological:  Negative for dizziness, syncope and light-headedness.  Psychiatric/Behavioral:  Negative for confusion.    Blood pressure 133/60, pulse 66, temperature 98.1 F (36.7 C), temperature source Oral, resp. rate 20, height 5' 1" (1.549 m), weight 53.5 kg, SpO2 98 %. Physical Exam Constitutional:      General: She is not in acute distress.    Appearance: She is well-developed and normal weight.  HENT:     Head: Normocephalic and atraumatic.  Cardiovascular:     Rate and Rhythm: Normal rate and regular rhythm.     Heart sounds: Normal heart sounds.  Pulmonary:     Effort: Pulmonary effort is normal. No respiratory distress.     Breath sounds: Normal breath sounds.  Abdominal:     General: There is no distension.     Palpations: Abdomen is soft.  Skin:    General: Skin is warm.     Findings:  Erythema (at R elbow and R wrist - see photo) present.  Neurological:     General: No focal deficit present.     Mental Status: She is alert and oriented to person, place, and time.  Psychiatric:        Mood and Affect: Mood normal.        Behavior: Behavior normal.  Erythema R arm at elbow and wrist   Patient and family concerned about LE swelling, no appreciable edema on exam other than very trace at [place], DO 10/31/2021, 1:40 PM

## 2021-10-31 NOTE — ED Provider Notes (Signed)
[facility] Provider Note    Event Date/Time   First MD Initiated Contact with Patient 10/31/21 1019     (approximate)   History   Leg Swelling, Abdominal Pain, and Arm Pain   HPI  Tanya Cole is a 88 y.o. female here with multiple complaints.  The patient's primary complaint is right-sided arm pain and difficulty getting around due to diffuse body aches and pain.  The patient states that over the last 2 days, she has had progressive worsening aching, throbbing, right shoulder, elbow, and wrist pain.  She is also developed right hand pain.  This is new.  She states that she is also developed hip pain and has had difficulty walking due to generalized body aches.  She was just seen and treated for sinusitis and has been taking antibiotics.  She states this seems to have improved.  She states that she has had some associated increasing lower extremity edema, weakness, and fatigue.  She had a mild cough.  Denies any known fevers.  No history of inflammatory arthritis that she is aware of.  No wounds.  Pain is worse with any kind of movement or palpation.  No alleviating factors.     Physical Exam   Triage Vital Signs: ED Triage Vitals  Enc Vitals Group     BP 10/31/21 0921 (!) 141/59     Pulse Rate 10/31/21 0921 67     Resp 10/31/21 0921 20     Temp 10/31/21 0921 98.1 F (36.7 C)     Temp Source 10/31/21 0921 Oral     SpO2 10/31/21 0921 99 %     Weight 10/31/21 0922 118 lb (53.5 kg)     Height 10/31/21 0922 5\' 1"  (1.549 m)     Head Circumference --      Peak Flow --      Pain Score 10/31/21 0921 10     Pain Loc --      Pain Edu? --      Excl. in [place]? --     Most recent vital signs: Vitals:   10/31/21 1215 10/31/21 1230  BP:  133/60  Pulse: 64 66  Resp:    Temp:    SpO2: 96% 98%     General: Awake, no distress.  CV:  Good peripheral perfusion.  Regular rate and rhythm, no murmurs or rubs. Resp:  Normal effort.  Slight bibasilar  rales. Abd:  No distention.  Nontender Other:  Erythema and moderate warmth to the right elbow, wrist, and first carpometacarpal joint of the right arm.  There is mild surrounding swelling but no overt joint effusion.  No wounds.  Marked tenderness with any passive range of motion at the elbow, wrist, or thumb.  2+ pitting edema bilateral lower extremities.   ED Results / Procedures / Treatments   Labs (all labs ordered are listed, but only abnormal results are displayed) Labs Reviewed  BASIC METABOLIC PANEL - Abnormal; Notable for the following components:      Result Value   Glucose, Bld 189 (*)    Calcium 8.4 (*)    All other components within normal limits  CBC - Abnormal; Notable for the following components:   WBC 12.9 (*)    All other components within normal limits  BRAIN NATRIURETIC PEPTIDE - Abnormal; Notable for the following components:   B Natriuretic Peptide 210.4 (*)    All other components within normal limits  SEDIMENTATION RATE - Abnormal; Notable for  the following components:   Sed Rate 51 (*)    All other components within normal limits  CULTURE, BLOOD (ROUTINE X 2)  CULTURE, BLOOD (ROUTINE X 2)  RESP PANEL BY RT-PCR (FLU A&B, COVID) ARPGX2  LIPASE, BLOOD  PROCALCITONIN  C-REACTIVE PROTEIN  URIC ACID  TROPONIN I (HIGH SENSITIVITY)  TROPONIN I (HIGH SENSITIVITY)     EKG  Normal sinus rhythm with short PR.  Ventricular rate 68 PR 98, QRS 94, QTc 448.  No acute ST elevations or depressions.  EKG evidence of acute ischemic infarct.   RADIOLOGY Chest x-ray reviewed by me, negative.  Agree with radiologist interpretation X-ray wrist right: Negative, no appreciable effusion on my review, agree with radiologist interpretation X-ray elbow right: Negative, no effusion on my review, agree with radiologist interpretation    PROCEDURES:  Critical Care performed: No  .1-3 Lead EKG Interpretation Performed by: Duffy Bruce, MD Authorized by: Duffy Bruce, MD     Interpretation: normal     ECG rate:  60-80   ECG rate assessment: normal     Rhythm: sinus rhythm     Ectopy: none     Conduction: normal   Comments:     Indication: Weakness   MEDICATIONS ORDERED IN ED: Medications  methylPREDNISolone sodium succinate (SOLU-MEDROL) 125 mg/2 mL injection 60 mg (has no administration in time range)  ketorolac (TORADOL) 30 MG/ML injection 15 mg (15 mg Intravenous Given 10/31/21 1141)  morphine 2 MG/ML injection 2 mg (2 mg Intravenous Given 10/31/21 1141)  ondansetron (ZOFRAN) injection 4 mg (4 mg Intravenous Given 10/31/21 1142)     IMPRESSION / MDM / [place] / ED COURSE  I reviewed the triage vital signs and the nursing notes.                              Differential diagnosis includes, but is not limited to, inflammatory polyarthritis such as gout, rheumatoid arthritis, polymyalgia, less likely septic arthritis.  The patient is on the cardiac monitor to evaluate for evidence of arrhythmia and/or significant heart rate changes.  88 year old female with past medical history of hypertension, hyperlipidemia, diabetes, but no known history of inflammatory arthritis here with multiple complaints.  Patient's primary complaint is right arm pain.  Exam is concerning for inflammatory arthritis.  Patient has polyarthritis involving the right elbow, wrist, and first carpometacarpal joint.  This is concerning for possible rheumatoid or gouty arthritis.  Patient did have recent sinus infection but multiple joint involvement makes septic arthritis less likely and she does not appear toxic clinically.  She also has hip and diffuse leg pain with limited mobility related to this, for which we will admit for pain control and physical therapy.  Regarding the patient's labs, she has a mild leukocytosis.  BMP with normal renal function.  Troponin negative.  Lipase negative.  Procalcitonin negative which makes bacterial infection less likely.  Reviewed  here imaging as above.  Given absence of any appreciable joint effusion, do not feel joint aspiration would be beneficial at this time.  Will admit for management of polyarthritis, pain control, and physical therapy.      FINAL CLINICAL IMPRESSION(S) / ED DIAGNOSES   Final diagnoses:  Polyarthritis  Limited mobility  Leukocytosis, unspecified type     Rx / DC Orders   ED Discharge Orders     None        Note:  This document was prepared using  [employer] forces training and education officer and may include unintentional dictation errors.   Duffy Bruce, MD 10/31/21 1240

## 2021-10-31 NOTE — Assessment & Plan Note (Addendum)
-   Continue home regimen 

## 2021-11-01 ENCOUNTER — Observation Stay (HOSPITAL_BASED_OUTPATIENT_CLINIC_OR_DEPARTMENT_OTHER)
Admit: 2021-11-01 | Discharge: 2021-11-01 | Disposition: A | Discharge status: Home / Self Care | Payer: Medicare HMO | Attending: Osteopathic Medicine | Admitting: Osteopathic Medicine

## 2021-11-01 ENCOUNTER — Observation Stay: Payer: Medicare HMO

## 2021-11-01 DIAGNOSIS — M130 Polyarthritis, unspecified: Secondary | ICD-10-CM | POA: Diagnosis not present

## 2021-11-01 DIAGNOSIS — I2510 Atherosclerotic heart disease of native coronary artery without angina pectoris: Secondary | ICD-10-CM

## 2021-11-01 DIAGNOSIS — M25511 Pain in right shoulder: Secondary | ICD-10-CM | POA: Diagnosis not present

## 2021-11-01 DIAGNOSIS — M353 Polymyalgia rheumatica: Secondary | ICD-10-CM | POA: Diagnosis not present

## 2021-11-01 LAB — CBC
HCT: 33.9 % — ABNORMAL LOW (ref 36.0–46.0)
Hemoglobin: 10.9 g/dL — ABNORMAL LOW (ref 12.0–15.0)
MCH: 27.6 pg (ref 26.0–34.0)
MCHC: 32.2 g/dL (ref 30.0–36.0)
MCV: 85.8 fL (ref 80.0–100.0)
Platelets: 325 10*3/uL (ref 150–400)
RBC: 3.95 MIL/uL (ref 3.87–5.11)
RDW: 13 % (ref 11.5–15.5)
WBC: 13.3 10*3/uL — ABNORMAL HIGH (ref 4.0–10.5)
nRBC: 0 % (ref 0.0–0.2)

## 2021-11-01 LAB — COMPREHENSIVE METABOLIC PANEL
ALT: 10 U/L (ref 0–44)
AST: 13 U/L — ABNORMAL LOW (ref 15–41)
Albumin: 3.1 g/dL — ABNORMAL LOW (ref 3.5–5.0)
Alkaline Phosphatase: 74 U/L (ref 38–126)
Anion gap: 9 (ref 5–15)
BUN: 29 mg/dL — ABNORMAL HIGH (ref 8–23)
CO2: 25 mmol/L (ref 22–32)
Calcium: 8.1 mg/dL — ABNORMAL LOW (ref 8.9–10.3)
Chloride: 101 mmol/L (ref 98–111)
Creatinine, Ser: 0.85 mg/dL (ref 0.44–1.00)
GFR, Estimated: >60 mL/min (ref >60)
Glucose, Bld: 190 mg/dL — ABNORMAL HIGH (ref 70–99)
Potassium: 3.9 mmol/L (ref 3.5–5.1)
Sodium: 135 mmol/L (ref 135–145)
Total Bilirubin: 0.7 mg/dL (ref 0.3–1.2)
Total Protein: 6.6 g/dL (ref 6.5–8.1)

## 2021-11-01 LAB — ECHOCARDIOGRAM COMPLETE
AR max vel: 1.78 cm2
AV Area VTI: 1.73 cm2
AV Area mean vel: 1.92 cm2
AV Mean grad: 7 mmHg
AV Peak grad: 15.7 mmHg
Ao pk vel: 1.98 m/s
Area-P 1/2: 3.66 cm2
Height: 61 in
MV VTI: 1.71 cm2
S' Lateral: 2.31 cm
Weight: 1888 oz

## 2021-11-01 LAB — HIV ANTIBODY (ROUTINE TESTING W REFLEX): HIV Screen 4th Generation wRfx: NONREACTIVE

## 2021-11-01 LAB — HEPATITIS B SURFACE ANTIGEN: Hepatitis B Surface Ag: NONREACTIVE

## 2021-11-01 LAB — URIC ACID: Uric Acid, Serum: 4.8 mg/dL (ref 2.5–7.1)

## 2021-11-01 LAB — HEPATITIS C ANTIBODY: HCV Ab: NONREACTIVE

## 2021-11-01 MED ORDER — PREDNISONE 10 MG PO TABS: ORAL_TABLET | ORAL | 0 refills | Status: DC

## 2021-11-01 MED ORDER — PREDNISONE 5 MG PO TABS: ORAL_TABLET | ORAL | 0 refills | Status: AC

## 2021-11-01 MED ORDER — AMOXICILLIN-POT CLAVULANATE 875-125 MG PO TABS: 1.0000 | ORAL_TABLET | Freq: Two times a day (BID) | ORAL | Status: DC

## 2021-11-01 MED ORDER — PREDNISONE 50 MG PO TABS
60.0000 mg | ORAL_TABLET | Freq: Every day | ORAL | Status: DC
  Administered 2021-11-01: 60 mg via ORAL
  Filled 2021-11-01: qty 3

## 2021-11-01 NOTE — Evaluation (Signed)
Physical Therapy Evaluation Patient Details Name: Tanya Cole MRN: [id number] DOB: Dec 26, 1932 Today's Date: 11/01/2021  History of Present Illness  Pt is an 88 y.o. female with medical history significant of hypertension, hyperlipidemia, diabetes mellitus, asthma, polymyalgia rheumatica syndrome, CAD, myocardial infarction, and CHF, who presents with right-sided arm pain and difficulty with mobility.  MD assessment includes polyarthritis involving R elbow, R wrist, R shoulder, and bilateral legs.   Clinical Impression  Pt was pleasant and motivated to participate during the session and put forth good effort throughout. Pt required no physical assistance with functional tasks but required some increased time and effort with tasks and ambulated a self-selected maximum of 50'.  Pt reported feeling weaker than at baseline and will benefit from HHPT upon discharge to safely address deficits listed in patient problem list for decreased caregiver assistance and eventual return to PLOF.         Recommendations for follow up therapy are one component of a multi-disciplinary discharge planning process, led by the attending physician.  Recommendations may be updated based on patient status, additional functional criteria and insurance authorization.  Follow Up Recommendations Home health PT    Assistance Recommended at Discharge Frequent or constant Supervision/Assistance  Functional Status Assessment    Equipment Recommendations  Rolling walker (2 wheels)    Recommendations for Other Services       Precautions / Restrictions Precautions Precautions: Fall Restrictions Weight Bearing Restrictions: No      Mobility  Bed Mobility Overal bed mobility: Needs Assistance Bed Mobility: Supine to Sit;Sit to Supine     Supine to sit: Supervision Sit to supine: Supervision   General bed mobility comments: supervision d/t high height of ED bed. No physical assist required     Transfers Overall transfer level: Needs assistance Equipment used: Rolling walker (2 wheels) Transfers: Sit to/from Stand Sit to Stand: Supervision           General transfer comment: Min verbal cues for sequencing    Ambulation/Gait Ambulation/Gait assistance: Supervision Gait Distance (Feet): 50 Feet Assistive device: Rolling walker (2 wheels) Gait Pattern/deviations: Step-through pattern;Decreased step length - right;Decreased step length - left Gait velocity: decreased     General Gait Details: Slow cadence with min lean on the RW for support but steady without LOB  Stairs            Wheelchair Mobility    Modified Rankin (Stroke Patients Only)       Balance Overall balance assessment: Needs assistance Sitting-balance support: No upper extremity supported;Feet unsupported Sitting balance-Leahy Scale: Good Sitting balance - Comments: Good sitting balance reaching within BOS   Standing balance support: During functional activity;Bilateral upper extremity supported Standing balance-Leahy Scale: Good Standing balance comment: Min lean on the RW for support                             Pertinent Vitals/Pain Pain Assessment: No/denies pain Faces Pain Scale: Hurts a little bit Pain Location: R shoulder & elbow with movement Pain Descriptors / Indicators: Aching Pain Intervention(s): Limited activity within patient's tolerance;Monitored during session    Home Living Family/patient expects to be discharged to:: Private residence Living Arrangements: Alone Available Help at Discharge: Family;Available PRN/intermittently Type of Home: House Home Access: Stairs to enter Entrance Stairs-Rails: None Entrance Stairs-Number of Steps: 2   Home Layout: One level Home Equipment: Standard Walker;BSC/3in1;Cane - single point;Grab bars - tub/shower      Prior Function Prior  Level of Function : Independent/Modified Independent              Mobility Comments: Uses standard walker (no wheels) during functional mobility. 1 fall December 23rd (d/t dizziness; see recent ED visit) ADLs Comments: Independent with ADLs, cooking, and cleaning. Family assists with medication management and grocery shopping     Hand Dominance   Dominant Hand: Right    Extremity/Trunk Assessment   Upper Extremity Assessment Upper Extremity Assessment: Defer to OT evaluation    Lower Extremity Assessment Lower Extremity Assessment: Generalized weakness       Communication   Communication: No difficulties  Cognition Arousal/Alertness: Awake/alert Behavior During Therapy: WFL for tasks assessed/performed Overall Cognitive Status: Impaired/Different from baseline                                 General Comments: Pt alert and oriented to self, place, and some aspects of situation. Pt's daughter reports that orientation is similiar to baseline, but that pt endorses hallucinations which is different from baseline        General Comments General comments (skin integrity, edema, etc.): During functional mobility, SpO2 >92% and HR 100s. Some redness noted on R elbow    Exercises Total Joint Exercises Ankle Circles/Pumps: AROM;Strengthening;Both;10 reps Quad Sets: 10 reps;Both;Strengthening Gluteal Sets: Strengthening;Both;10 reps Other Exercises Other Exercises: HEP education for BLE APs, QS, and GS x 10 each every 1-2 hours daily   Assessment/Plan    PT Assessment Patient needs continued PT services  PT Problem List Decreased strength;Decreased activity tolerance;Decreased balance;Decreased mobility       PT Treatment Interventions DME instruction;Gait training;Functional mobility training;Therapeutic activities;Stair training;Therapeutic exercise;Balance training;Patient/family education    PT Goals (Current goals can be found in the Care Plan section)  Acute Rehab PT Goals Patient Stated Goal: To get stronger PT Goal  Formulation: With patient Time For Goal Achievement: 11/14/21 Potential to Achieve Goals: Good    Frequency Min 2X/week   Barriers to discharge        Co-evaluation               AM-PAC PT "6 Clicks" Mobility  Outcome Measure Help needed turning from your back to your side while in a flat bed without using bedrails?: A Little Help needed moving from lying on your back to sitting on the side of a flat bed without using bedrails?: A Little Help needed moving to and from a bed to a chair (including a wheelchair)?: A Little Help needed standing up from a chair using your arms (e.g., wheelchair or bedside chair)?: A Little Help needed to walk in hospital room?: A Little Help needed climbing 3-5 steps with a railing? : A Little 6 Click Score: 18    End of Session Equipment Utilized During Treatment: Gait belt Activity Tolerance: Patient tolerated treatment well Patient left: in bed;with call bell/phone within reach;with family/visitor present Nurse Communication: Mobility status PT Visit Diagnosis: Muscle weakness (generalized) (M62.81);Difficulty in walking, not elsewhere classified (R26.2)    Time: 0981-1914 PT Time Calculation (min) (ACUTE ONLY): 21 min   Charges:   PT Evaluation $PT Eval Moderate Complexity: 1 Mod          D. Scott Dayan Desa PT, DPT 11/01/21, 1:35 PM

## 2021-11-01 NOTE — TOC Progression Note (Signed)
Transition of Care [facility]) - Progression Note    Patient Details  Name: Tanya Cole MRN: [id number] Date of Birth: [birth year]  Transition of Care [facility]) CM/SW Contact  Marlowe Sax, RN Phone Number: 11/01/2021, 2:40 PM  Clinical Narrative:   attempted to talk with the patient about Home health needs, she was not available, I called her daughter and left a general VM to get [facility] set up, the patient has a RW, a cane and a 3 in 1 at home, provided my number to call back         Expected Discharge Plan and Services           Expected Discharge Date: 11/01/21                                     Social Determinants of Health (SDOH) Interventions    Readmission Risk Interventions Readmission Risk Prevention Plan 05/14/2021  Transportation Screening Complete  PCP or Specialist Appt within 5-7 Days Complete  Home Care Screening Complete  Medication Review (RN CM) Referral to Pharmacy  Some recent data might be hidden

## 2021-11-01 NOTE — Evaluation (Signed)
Occupational Therapy Evaluation Patient Details Name: Tanya Cole MRN: [id number] DOB: 29-Sep-1933 Today's Date: 11/01/2021   History of Present Illness 88 y.o. female with medical history significant of hypertension, hyperlipidemia, diabetes mellitus, asthma, polymyalgia rheumatica syndrome, CAD, myocardial infarction, and CHF, who presents with right-sided arm pain and difficulty getting around due to diffuse body aches and pain.   Clinical Impression   Pt seen for OT evaluation this date. Upon arrival to room, pt awake and seated upright in bed with daughter present. Pt alert and oriented to self, place, and some aspects of situation (pt's daughter reports that pt's orientation is similiar to baseline, but that pt has been having hallucinations, which does not occur at baseline). At baseline, pt is mod-independent with walker for functional mobility and ADLs. Prior to admission, pt was living alone in a 1-level home with 2-3 steps to enter, and reports that she was cooking and cleaning independently, but receives help from family for medication management (d/t decreased short term memory) and grocery shopping. Pt endorses mild R shoulder & elbow pain during AROM, however strength grossly 4/5 in all movements and sensation WFL. Pt currently requires SUPERVISION for bed mobility, SUPERVISION for seated LB dressing, SUPERVISION for functional mobility of short household distances (~56ft) with RW, and SUPERVISION for standing hand hygiene due to decreased balance and activity tolerance. Pt would benefit from additional skilled OT services to maximize return to PLOF and minimize risk of future falls, injury, caregiver burden, and readmission. Upon discharge, recommend HHOT services.         Recommendations for follow up therapy are one component of a multi-disciplinary discharge planning process, led by the attending physician.  Recommendations may be updated based on patient status, additional  functional criteria and insurance authorization.   Follow Up Recommendations  Home health OT    Assistance Recommended at Discharge Intermittent Supervision/Assistance  Functional Status Assessment  Patient has had a recent decline in their functional status and demonstrates the ability to make significant improvements in function in a reasonable and predictable amount of time.  Equipment Recommendations  Other (comment) (2ww)       Precautions / Restrictions Precautions Precautions: Fall Restrictions Weight Bearing Restrictions: No      Mobility Bed Mobility Overal bed mobility: Needs Assistance Bed Mobility: Supine to Sit     Supine to sit: Supervision;HOB elevated     General bed mobility comments: supervision d/t high height of ED bed. No physical assist required    Transfers Overall transfer level: Needs assistance Equipment used: Rolling walker (2 wheels) Transfers: Sit to/from Stand Sit to Stand: Supervision           General transfer comment: Requires verbal cues for safe hand placement with RW use      Balance Overall balance assessment: Needs assistance Sitting-balance support: No upper extremity supported;Feet unsupported Sitting balance-Leahy Scale: Good Sitting balance - Comments: Good sitting balance reaching within BOS   Standing balance support: No upper extremity supported;During functional activity Standing balance-Leahy Scale: Good Standing balance comment: Requires supervision for standing hand hygiene                           ADL either performed or assessed with clinical judgement   ADL Overall ADL's : Needs assistance/impaired     Grooming: Wash/dry hands;Supervision/safety;Standing               Lower Body Dressing: Supervision/safety;Set up;Sitting/lateral leans Lower Body Dressing Details (indicate  cue type and reason): to don socks while sitting EOB             Functional mobility during ADLs:  Supervision/safety;Rolling walker (2 wheels) (to walk ~15ft)       Vision Baseline Vision/History: 1 Wears glasses Ability to See in Adequate Light: 1 Impaired Patient Visual Report: No change from baseline              Pertinent Vitals/Pain Pain Assessment: Faces Faces Pain Scale: Hurts a little bit Pain Location: R shoulder & elbow with movement Pain Descriptors / Indicators: Aching Pain Intervention(s): Limited activity within patient's tolerance;Monitored during session     Hand Dominance Right   Extremity/Trunk Assessment Upper Extremity Assessment Upper Extremity Assessment: Overall WFL for tasks assessed (grossly at least 4/5 in all movements & sensation WFL)   Lower Extremity Assessment Lower Extremity Assessment: Generalized weakness       Communication Communication Communication: No difficulties   Cognition Arousal/Alertness: Awake/alert Behavior During Therapy: WFL for tasks assessed/performed Overall Cognitive Status: Impaired/Different from baseline                                 General Comments: Pt alert and oriented to self, place, and some aspects of situation. Pt's daughter reports that orientation is similiar to baseline, but that pt endorses hallucinations which is different from baseline     General Comments  During functional mobility, SpO2 >92% and HR 100s. Some redness noted on R elbow            Home Living Family/patient expects to be discharged to:: Private residence Living Arrangements: Alone Available Help at Discharge: Family;Available PRN/intermittently (3 adult children available intermittently) Type of Home: House Home Access: Stairs to enter [employer] of Steps: 2 Entrance Stairs-Rails: None Home Layout: One level     Bathroom Shower/Tub: Tub/shower unit;Sponge bathes at baseline   [employer]: Standard     Home Equipment: Standard Walker;BSC/3in1;Cane - single point;Grab bars -  tub/shower          Prior Functioning/Environment Prior Level of Function : Independent/Modified Independent             Mobility Comments: Uses standard walker (no wheels) during functional mobility. 1 fall December 23rd (d/t dizziness; see recent ED visit) ADLs Comments: Independent with ADLs, cooking, and cleaning. Family assists with medication management and grocery shopping        OT Problem List: Impaired balance (sitting and/or standing);Decreased strength;Decreased knowledge of use of DME or AE      OT Treatment/Interventions: Self-care/ADL training;Therapeutic exercise;Energy conservation;Therapeutic activities;Patient/family education;Balance training    OT Goals(Current goals can be found in the care plan section) Acute Rehab OT Goals Patient Stated Goal: to regain independence OT Goal Formulation: With patient/family Time For Goal Achievement: 11/15/21 Potential to Achieve Goals: Good  OT Frequency: Min 2X/week    AM-PAC OT "6 Clicks" Daily Activity     Outcome Measure Help from another person eating meals?: None Help from another person taking care of personal grooming?: A Little Help from another person toileting, which includes using toliet, bedpan, or urinal?: A Little Help from another person bathing (including washing, rinsing, drying)?: A Little Help from another person to put on and taking off regular upper body clothing?: None Help from another person to put on and taking off regular lower body clothing?: A Little 6 Click Score: 20   End of Session Equipment Utilized During  Treatment: Rolling walker (2 wheels) Nurse Communication: Mobility status  Activity Tolerance: Patient tolerated treatment well Patient left: Other (comment);with family/visitor present (standing with PT)  OT Visit Diagnosis: Unsteadiness on feet (R26.81);Pain Pain - Right/Left: Right Pain - part of body: Arm                Time: 9604-5409 OT Time Calculation (min): 22  min Charges:  OT General Charges $OT Visit: 1 Visit OT Evaluation $OT Eval Moderate Complexity: 1 Mod OT Treatments $Self Care/Home Management : 8-22 mins  Matthew Folks, OTR/L [facility] [phone]

## 2021-11-01 NOTE — TOC Progression Note (Signed)
Transition of Care [facility]) - Progression Note    Patient Details  Name: Tanya Cole MRN: [id number] Date of Birth: 10-13-1933  Transition of Care [facility]) CM/SW Contact  Marlowe Sax, RN Phone Number: 11/01/2021, 3:18 PM  Clinical Narrative:     Talked to the patient and her daughter on the phone, she has had [facility] in the past and would like to have again, I sent message to [facility] at [facility] with referral, She needs a RW, I submitted via fax to [facility]  to [phone] and asked [employer] to deliver to her home       Expected Discharge Plan and Services           Expected Discharge Date: 11/01/21                                     Social Determinants of Health (SDOH) Interventions    Readmission Risk Interventions Readmission Risk Prevention Plan 05/14/2021  Transportation Screening Complete  PCP or Specialist Appt within 5-7 Days Complete  Home Care Screening Complete  Medication Review (RN CM) Referral to Pharmacy  Some recent data might be hidden

## 2021-11-01 NOTE — Progress Notes (Signed)
*  PRELIMINARY RESULTS* °Echocardiogram °2D Echocardiogram has been performed. ° °Tanya Cole M [name] °11/01/2021, 9:33 AM °

## 2021-11-02 LAB — RHEUMATOID FACTOR: Rheumatoid fact SerPl-aCnc: 17.3 IU/mL — ABNORMAL HIGH (ref <14.0)

## 2021-11-02 LAB — ANTI-SMITH ANTIBODY: ENA SM Ab Ser-aCnc: <0.2 AI (ref 0.0–0.9)

## 2021-11-02 LAB — CYCLIC CITRUL PEPTIDE ANTIBODY, IGG/IGA: CCP Antibodies IgG/IgA: 33 units — ABNORMAL HIGH (ref 0–19)

## 2021-11-02 LAB — ANA W/REFLEX IF POSITIVE: Anti Nuclear Antibody (ANA): NEGATIVE

## 2021-11-02 LAB — RPR: RPR Ser Ql: NONREACTIVE

## 2021-11-02 NOTE — Assessment & Plan Note (Signed)
Prior history, now presented with painful right shoulder with swelling. Received IV steroids and swelling resolved.  Low suspicion for cellulitis no apparent effusion or abscess. WBC slight elevation, procalcitonin negative. pain well controlled  inflammatory/rheumatologic cause for polyarthralgia (Ddx would include new onset RA as she does have family history.  Most likely this is reactivation of PMR and will start her on low dose prednisone with slow taper. outpt follow up with rheumatology recommended

## 2021-11-02 NOTE — Discharge Summary (Signed)
Physician Discharge Summary   Patient: Tanya Cole MRN: [id number] DOB: @DOB   Admit date:     10/31/2021  Discharge date: 11/01/2021  Discharge Physician: Berle Mull   PCP: Einar Pheasant, MD   Recommendations at discharge: Follow up with PCP in 1 week, also follow up with rheumatology  Follow up on inflammatory arthritis work up sent this admission.   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 Received IV steroids and swelling resolved.  Low suspicion for cellulitis no apparent effusion or abscess. WBC slight elevation, procalcitonin negative. pain well controlled  inflammatory/rheumatologic cause for polyarthralgia (Ddx would include new onset RA as she does have family history.  Most likely this is reactivation of PMR and will start her on low dose prednisone with slow taper. outpt follow up with rheumatology recommended  COPD (chronic obstructive pulmonary disease) ([place])- (present on admission) Stable, continue home inhalers  continue home Augmentin   Coronary artery disease of native heart with stable angina pectoris ([place])- (present on admission) Continue home statin, beta blocker.  Diabetes mellitus with cardiac complication ([place])- (present on admission) On metformin only at home, resume  Hypertension- (present on admission) BP stable, continue home meds,   Osteoporosis- (present on admission) Continue home regimen      Consultants: none Procedures performed: none  Disposition:  Home Diet recommendation: Cardiac diet  DISCHARGE MEDICATION: Allergies as of 11/01/2021       Reactions   Tramadol Itching, Nausea And Vomiting        Medication List     TAKE these medications    alendronate 70 MG tablet Commonly known as: FOSAMAX Take 70 mg by mouth once a week. Notes to patient: Not given in hospital   amoxicillin-clavulanate 1000-62.5 MG 12 hr tablet Commonly known as: Augmentin XR Take 1 tablet by mouth 2 (two) times daily for 7 days.   aspirin 81 MG tablet Take 81 mg by mouth daily. Notes to patient: Not given in hospital   atorvastatin 80 MG tablet Commonly known as: LIPITOR TAKE 1 TABLET BY MOUTH ONCE DAILY AT  6PM   fluticasone 50 MCG/ACT nasal spray Commonly known as: FLONASE Use 2 spray(s) in each nostril once daily Notes to patient: Not given in hospital   fluticasone-salmeterol 250-50 MCG/ACT Aepb Commonly known as: ADVAIR Inhale 1 puff into the lungs in the morning and at bedtime.   ipratropium-albuterol 0.5-2.5 (3) MG/3ML Soln Commonly known as: DUONEB Take 3 mLs by nebulization 2 (two) times daily. Notes to patient: Not given in hospital   meclizine 25 MG tablet Commonly known as: ANTIVERT Take 1 tablet (25 mg total) by mouth 3 (three) times daily as needed for dizziness. Notes to patient: Not given in hospital   metFORMIN 500 MG tablet Commonly known as: GLUCOPHAGE TAKE 1 TABLET BY MOUTH TWICE DAILY WITH A MEAL Notes to patient: Not given in hospital   metoprolol tartrate 25 MG tablet Commonly known as: LOPRESSOR Take 1 tablet by mouth twice daily   nitroGLYCERIN 0.4 MG SL tablet Commonly known as: NITROSTAT Place 1 tablet (  0.4 mg total) under the tongue every 5 (five) minutes as needed for chest pain.   ondansetron 4 MG disintegrating tablet Commonly known as: ZOFRAN-ODT Take 1 tablet (4 mg total) by mouth every 8 (eight) hours as needed for nausea or vomiting.   predniSONE 5 MG tablet Commonly known as:  DELTASONE Take 3 tablets (15 mg total) by mouth daily with breakfast for 30 days, THEN 2.5 tablets (12.5 mg total) daily with breakfast for 30 days, THEN 2 tablets (10 mg total) daily with breakfast. Start taking on: November 01, 2021   ProAir HFA 108 (90 Base) MCG/ACT inhaler Generic drug: albuterol Inhale 2 puffs into the lungs every 4 (four) hours as needed for wheezing or shortness of breath.   Vitamin D (Ergocalciferol) 1.25 MG (50000 UNIT) Caps capsule Commonly known as: DRISDOL Take 50,000 Units by mouth once a week.         Discharge Exam: Filed Weights   10/31/21 [id number]  Weight: 53.5 kg   General: Appear in mild distress, no Rash; Oral Mucosa Clear, moist. no Abnormal Neck Mass Or lumps, Conjunctiva normal  Cardiovascular: S1 and S2 Present, no Murmur, Respiratory: good respiratory effort, Bilateral Air entry present and CTA, no Crackles, no wheezes Abdomen: Bowel Sound present, Soft and no tenderness Extremities: no Pedal edema, normal ROM in right UE Neurology: alert and oriented to time, place, and person affect appropriate. no new focal deficit Gait not checked due to patient safety concerns   Condition at discharge: good  The results of significant diagnostics from this hospitalization (including imaging, microbiology, ancillary and laboratory) are listed below for reference.   Imaging Studies: DG Shoulder Right  Result Date: 11/01/2021 CLINICAL DATA:  Right shoulder pain EXAM: RIGHT SHOULDER - 2+ VIEW COMPARISON:  None FINDINGS: No signs of acute fracture or dislocation. Mild degenerative changes identified at the acromioclavicular joint. No radiopaque foreign bodies. IMPRESSION: 1. No acute findings. 2. AC joint osteoarthritis. Electronically Signed   By: Kerby Moors M.D.   On: 11/01/2021 11:40   DG Elbow Complete Right  Result Date: 10/31/2021 CLINICAL DATA:  Acute right elbow pain without known injury. EXAM: RIGHT ELBOW - COMPLETE 3+ VIEW COMPARISON:  None.  FINDINGS: There is no evidence of fracture, dislocation, or joint effusion. There is no evidence of arthropathy or other focal bone abnormality. Soft tissues are unremarkable. IMPRESSION: Negative. Electronically Signed   By: Marijo Conception M.D.   On: 10/31/2021 12:08   DG Wrist Complete Right  Result Date: 10/31/2021 CLINICAL DATA:  Acute right wrist pain without known injury. EXAM: RIGHT WRIST - COMPLETE 3+ VIEW COMPARISON:  None. FINDINGS: There is no evidence of fracture or dislocation. There is no evidence of arthropathy or other focal bone abnormality. Soft tissues are unremarkable. IMPRESSION: Negative. Electronically Signed   By: Marijo Conception M.D.   On: 10/31/2021 12:06   CT HEAD WO CONTRAST  Result Date: 10/26/2021 CLINICAL DATA:  Head trauma, minor. Dizziness, nonspecific. [place] a week ago. EXAM: CT HEAD WITHOUT CONTRAST TECHNIQUE: Contiguous axial images were obtained from the base of the skull through the vertex without intravenous contrast. COMPARISON:  09/15/2021 FINDINGS: Brain: Diffuse cerebral atrophy. Ventricular dilatation consistent with central atrophy. Low-attenuation changes in the deep white matter consistent with small vessel ischemia. No abnormal extra-axial fluid collections. No mass effect or midline shift. Gray-white matter junctions are distinct. Basal cisterns are not effaced. No acute intracranial hemorrhage. Vascular: Moderate intracranial arterial vascular calcifications. Skull: Calvarium appears intact. Sinuses/Orbits: Mucosal  thickening of the paranasal sinuses with opacification of ethmoid air cells, likely inflammatory. Mastoid air cells are clear. Other: None. IMPRESSION: No acute intracranial abnormalities. Chronic atrophy and small vessel ischemic changes. Inflammatory changes in the paranasal sinuses. Electronically Signed   By: Lucienne Capers M.D.   On: 10/26/2021 19:03   MR BRAIN WO CONTRAST  Result Date: 10/27/2021 CLINICAL DATA:  Nonspecific dizziness.  EXAM: MRI HEAD WITHOUT CONTRAST TECHNIQUE: Multiplanar, multiecho pulse sequences of the brain and surrounding structures were obtained without intravenous contrast. COMPARISON:  Head CT from yesterday FINDINGS: Brain: No acute infarction, hemorrhage, hydrocephalus, extra-axial collection or mass lesion. Small chronic cerebellar infarcts bilaterally. Extensive chronic small vessel ischemic gliosis in the hemispheric white matter. Chronic lacunar infarct in the left caudate head. Vascular: Normal flow voids. Skull and upper cervical spine: Normal marrow signal Sinuses/Orbits: Generalized mucosal thickening in the paranasal sinuses, confluent in the left frontal sinus especially. This is chronic when compared to prior CT. No acute superimposed finding. No bony dehiscence by prior CT. IMPRESSION: 1. No acute or reversible finding. 2. Widespread chronic small vessel ischemia in the hemispheric white matter. Small chronic cerebellar infarcts. Electronically Signed   By: Jorje Guild M.D.   On: 10/27/2021 05:16   DG Chest Portable 1 View  Result Date: 10/31/2021 CLINICAL DATA:  88 year old female with history of shortness of breath. EXAM: PORTABLE CHEST 1 VIEW COMPARISON:  Chest x-ray 09/15/2021. FINDINGS: Lung volumes are normal. Diffuse peribronchial cuffing. Mild scarring at the left lung base, similar to the prior study. No consolidative airspace disease. No pleural effusions. No pneumothorax. No pulmonary nodule or mass noted. Pulmonary vasculature and the cardiomediastinal silhouette are within normal limits. Atherosclerosis in the thoracic aorta. IMPRESSION: 1. Diffuse peribronchial cuffing, concerning for an acute bronchitis. 2. Aortic atherosclerosis. Electronically Signed   By: Vinnie Langton M.D.   On: 10/31/2021 12:33   ECHOCARDIOGRAM COMPLETE  Result Date: 11/01/2021    ECHOCARDIOGRAM REPORT   Patient Name:   Tanya Cole [facility] Date of Exam: 11/01/2021 Medical Rec #:  [id number]          Height:        61.0 in Accession #:    [id number]         Weight:       118.0 lb Date of Birth:  1933-07-13          BSA:          1.509 m Patient Age:    88 years           BP:           131/62 mmHg Patient Gender: F                  HR:           72 bpm. Exam Location:  [facility] Procedure: 2D Echo, Color Doppler and Cardiac Doppler Indications:     I25.10 coronary artery disease Native Vessel  History:         Patient has prior history of Echocardiogram examinations, most                  recent 06/28/2021. HFpEF.  Sonographer:     Charmayne Sheer Referring Phys:  [id number] Emeterio Reeve Diagnosing Phys: Nelva Bush MD  Sonographer Comments: Suboptimal subcostal window. IMPRESSIONS  1. Left ventricular ejection fraction, by estimation, is 60 to 65%. The left ventricle has normal function. The left ventricle has no regional wall motion abnormalities. There is mild  left ventricular hypertrophy of the basal-septal segment. Left ventricular diastolic parameters are indeterminate.  2. Right ventricular systolic function is normal. The right ventricular size is normal. Mildly increased right ventricular wall thickness. There is moderately elevated pulmonary artery systolic pressure.  3. Left atrial size was mildly dilated.  4. The mitral valve is degenerative. Mild mitral valve regurgitation. Mild mitral stenosis.  5. The aortic valve has an indeterminant number of cusps. There is mild calcification of the aortic valve. There is mild thickening of the aortic valve. Aortic valve regurgitation is not visualized. Aortic valve sclerosis/calcification is present, without any evidence of aortic stenosis.  6. The inferior vena cava is normal in size with <50% respiratory variability, suggesting right atrial pressure of 8 mmHg. FINDINGS  Left Ventricle: Left ventricular ejection fraction, by estimation, is 60 to 65%. The left ventricle has normal function. The left ventricle has no regional wall motion abnormalities. The left ventricular internal  cavity size was normal in size. There is  mild left ventricular hypertrophy of the basal-septal segment. Left ventricular diastolic parameters are indeterminate. Right Ventricle: The right ventricular size is normal. Mildly increased right ventricular wall thickness. Right ventricular systolic function is normal. There is moderately elevated pulmonary artery systolic pressure. The tricuspid regurgitant velocity is 3.54 m/s, and with an assumed right atrial pressure of 8 mmHg, the estimated right ventricular systolic pressure is 58.1 mmHg. Left Atrium: Left atrial size was mildly dilated. Right Atrium: Right atrial size was normal in size. Pericardium: There is no evidence of pericardial effusion. Presence of epicardial fat layer. Mitral Valve: The mitral valve is degenerative in appearance. There is mild thickening of the mitral valve leaflet(s). Mild mitral annular calcification. Mild mitral valve regurgitation. Mild mitral valve stenosis. MV peak gradient, 13.5 mmHg. The mean mitral valve gradient is 5.0 mmHg. Tricuspid Valve: The tricuspid valve is not well visualized. Tricuspid valve regurgitation is mild. Aortic Valve: The aortic valve has an indeterminant number of cusps. There is mild calcification of the aortic valve. There is mild thickening of the aortic valve. Aortic valve regurgitation is not visualized. Aortic valve sclerosis/calcification is present, without any evidence of aortic stenosis. Aortic valve mean gradient measures 7.0 mmHg. Aortic valve peak gradient measures 15.7 mmHg. Aortic valve area, by VTI measures 1.73 cm. Pulmonic Valve: The pulmonic valve was not well visualized. Pulmonic valve regurgitation is not visualized. No evidence of pulmonic stenosis. Aorta: The aortic root and ascending aorta are structurally normal, with no evidence of dilitation. Pulmonary Artery: The pulmonary artery is of normal size. Venous: The inferior vena cava is normal in size with less than 50% respiratory  variability, suggesting right atrial pressure of 8 mmHg. IAS/Shunts: No atrial level shunt detected by color flow Doppler.  LEFT VENTRICLE PLAX 2D LVIDd:         3.63 cm   Diastology LVIDs:         2.31 cm   LV e' medial:    8.81 cm/s LV PW:         0.97 cm   LV E/e' medial:  17.6 LV IVS:        1.04 cm   LV e' lateral:   9.90 cm/s LVOT diam:     2.00 cm   LV E/e' lateral: 15.7 LV SV:         63 LV SV Index:   42 LVOT Area:     3.14 cm  RIGHT VENTRICLE RV Basal diam:  2.91 cm LEFT ATRIUM  Index        RIGHT ATRIUM          Index LA diam:        3.70 cm 2.45 cm/m   RA Area:     9.49 cm LA Vol (A2C):   57.0 ml 37.76 ml/m  RA Volume:   17.50 ml 11.59 ml/m LA Vol (A4C):   46.2 ml 30.61 ml/m LA Biplane Vol: 53.3 ml 35.31 ml/m  AORTIC VALVE                     PULMONIC VALVE AV Area (Vmax):    1.78 cm      PV Vmax:       0.95 m/s AV Area (Vmean):   1.92 cm      PV Vmean:      69.700 cm/s AV Area (VTI):     1.73 cm      PV VTI:        0.201 m AV Vmax:           198.00 cm/s   PV Peak grad:  3.6 mmHg AV Vmean:          123.000 cm/s  PV Mean grad:  2.0 mmHg AV VTI:            0.363 m AV Peak Grad:      15.7 mmHg AV Mean Grad:      7.0 mmHg LVOT Vmax:         112.00 cm/s LVOT Vmean:        75.100 cm/s LVOT VTI:          0.200 m LVOT/AV VTI ratio: 0.55  AORTA Ao Root diam: 3.10 cm MITRAL VALVE                TRICUSPID VALVE MV Area (PHT): 3.66 cm     TR Peak grad:   50.1 mmHg MV Area VTI:   1.71 cm     TR Vmax:        354.00 cm/s MV Peak grad:  13.5 mmHg MV Mean grad:  5.0 mmHg     SHUNTS MV Vmax:       1.84 m/s     Systemic VTI:  0.20 m MV Vmean:      108.0 cm/s   Systemic Diam: 2.00 cm MV Decel Time: 207 msec MV E velocity: 155.00 cm/s MV A velocity: 99.00 cm/s MV E/A ratio:  1.57 Harrell Gave End MD Electronically signed by Nelva Bush MD Signature Date/Time: 11/01/2021/10:21:59 AM    Final     Microbiology: Results for orders placed or performed during the hospital encounter of 10/31/21  Blood  culture (routine x 2)     Status: None (Preliminary result)   Collection Time: 10/31/21 11:29 AM   Specimen: BLOOD  Result Value Ref Range Status   Specimen Description BLOOD LEFT ANTECUBITAL  Final   Special Requests   Final    BOTTLES DRAWN AEROBIC AND ANAEROBIC Blood Culture results may not be optimal due to an inadequate volume of blood received in culture bottles   Culture   Final    NO GROWTH 2 DAYS Performed at [facility], [address] [place], [place] [address]    Report Status PENDING  Incomplete  Blood culture (routine x 2)     Status: None (Preliminary result)   Collection Time: 10/31/21 11:29 AM   Specimen: BLOOD  Result Value Ref Range Status   Specimen Description BLOOD  BLOOD LEFT FOREARM  Final   Special Requests   Final    BOTTLES DRAWN AEROBIC AND ANAEROBIC Blood Culture results may not be optimal due to an inadequate volume of blood received in culture bottles   Culture   Final    NO GROWTH 2 DAYS Performed at [facility], [address], [place], [place] [address]    Report Status PENDING  Incomplete  Resp Panel by RT-PCR (Flu A&B, Covid) Nasopharyngeal Swab     Status: None   Collection Time: 10/31/21 11:35 AM   Specimen: Nasopharyngeal Swab; Nasopharyngeal(NP) swabs in vial transport medium  Result Value Ref Range Status   SARS Coronavirus 2 by RT PCR NEGATIVE NEGATIVE Final    Comment: (NOTE) SARS-CoV-2 target nucleic acids are NOT DETECTED.  The SARS-CoV-2 RNA is generally detectable in upper respiratory specimens during the acute phase of infection. The lowest concentration of SARS-CoV-2 viral copies this assay can detect is 138 copies/mL. A negative result does not preclude SARS-Cov-2 infection and should not be used as the sole basis for treatment or other patient management decisions. A negative result may occur with  improper specimen collection/handling, submission of specimen other than nasopharyngeal swab, presence of  viral mutation(s) within the areas targeted by this assay, and inadequate number of viral copies(<138 copies/mL). A negative result must be combined with clinical observations, patient history, and epidemiological information. The expected result is Negative.  Fact Sheet for Patients:  EntrepreneurPulse.com.au  Fact Sheet for Healthcare Providers:  IncredibleEmployment.be  This test is no t yet approved or cleared by the [place] FDA and  has been authorized for detection and/or diagnosis of SARS-CoV-2 by FDA under an Emergency Use Authorization (EUA). This EUA will remain  in effect (meaning this test can be used) for the duration of the COVID-19 declaration under Section 564(b)(1) of the Act, 21 U.S.C.section 360bbb-3(b)(1), unless the authorization is terminated  or revoked sooner.       Influenza A by PCR NEGATIVE NEGATIVE Final   Influenza B by PCR NEGATIVE NEGATIVE Final    Comment: (NOTE) The Xpert Xpress SARS-CoV-2/FLU/RSV plus assay is intended as an aid in the diagnosis of influenza from Nasopharyngeal swab specimens and should not be used as a sole basis for treatment. Nasal washings and aspirates are unacceptable for Xpert Xpress SARS-CoV-2/FLU/RSV testing.  Fact Sheet for Patients: EntrepreneurPulse.com.au  Fact Sheet for Healthcare Providers: IncredibleEmployment.be  This test is not yet approved or cleared by the [place] FDA and has been authorized for detection and/or diagnosis of SARS-CoV-2 by FDA under an Emergency Use Authorization (EUA). This EUA will remain in effect (meaning this test can be used) for the duration of the COVID-19 declaration under Section 564(b)(1) of the Act, 21 U.S.C. section 360bbb-3(b)(1), unless the authorization is terminated or revoked.  Performed at [facility], [address] [place], [place] [address]     Labs: CBC: Recent  Labs  Lab 10/31/21 0931 11/01/21 0645  WBC 12.9* 13.3*  HGB 12.2 10.9*  HCT 37.8 33.9*  MCV 86.5 85.8  PLT 299 [id number]   Basic Metabolic Panel: Recent Labs  Lab 10/31/21 0931 11/01/21 0645  NA 135 135  K 3.6 3.9  CL 100 101  CO2 26 25  GLUCOSE 189* 190*  BUN 15 29*  CREATININE 0.77 0.85  CALCIUM 8.4* 8.1*   Liver Function Tests: Recent Labs  Lab 10/31/21 1135 11/01/21 0645  AST 17 13*  ALT 9 10  ALKPHOS 68 74  BILITOT 0.7 0.7  PROT 6.7 6.6  ALBUMIN 3.2* 3.1*   CBG: No results for input(s): GLUCAP in the last 168 hours.  Discharge time spent: greater than 30 minutes.  Signed:  Berle Mull MD.  [facility]

## 2021-11-05 LAB — CULTURE, BLOOD (ROUTINE X 2)
Culture: NO GROWTH
Culture: NO GROWTH

## 2021-11-10 ENCOUNTER — Telehealth: Payer: Self-pay | Admitting: Internal Medicine

## 2021-11-10 NOTE — Telephone Encounter (Signed)
Tanya Cole from [facility] called in regards to pts diagnosis. She states pts diagnosis is unspecified in her documentation. Due to new laws they have to have documentation specifically stating where the arthritis is. What joint, limb, etc.? Tanya Cole  states she can be told verbally or reached at [phone] Fax number: [phone]

## 2021-11-10 NOTE — Telephone Encounter (Signed)
Tanya Cole is calling back in regards to previous message

## 2021-11-11 NOTE — Telephone Encounter (Signed)
Selena Everette called wanting a diagnostic code for pt [id number]

## 2021-11-11 NOTE — Telephone Encounter (Signed)
LM for [employer].

## 2021-11-11 NOTE — Telephone Encounter (Signed)
Kelsey from [facility] called wanting know about the location of the diagnosis

## 2021-11-12 ENCOUNTER — Ambulatory Visit (INDEPENDENT_AMBULATORY_CARE_PROVIDER_SITE_OTHER): Payer: Medicare HMO | Admitting: *Deleted

## 2021-11-12 DIAGNOSIS — I10 Essential (primary) hypertension: Secondary | ICD-10-CM

## 2021-11-12 DIAGNOSIS — E1159 Type 2 diabetes mellitus with other circulatory complications: Secondary | ICD-10-CM

## 2021-11-12 NOTE — Telephone Encounter (Signed)
Called Selena Everette no answer left voicemail to call office ask for [employer]arkKathy RN Clinical Supervisor, need to advise that patient polyarthritis DX codes for site specific.

## 2021-11-12 NOTE — Patient Instructions (Addendum)
Visit Information  Thank you for taking time to visit with me today. Please don't hesitate to contact me if I can be of assistance to you before our next scheduled telephone appointment.  Following are the goals we discussed today:  Patient will self administer medications as prescribed Patient will attend all scheduled provider appointments Patient will continue to perform ADL's independently Develop a rescue plan & follow rescue plan if symptoms flare-up Eliminate symptom triggers at home Consider using nebulizer for increase in shortness of breath Contact provider for increase shortness of breath and increased need for rescue inhaler  Keep and attend follow-up appointments Weigh self daily and keep in log for provider review Notify if you gain 2-3 pounds overnight of 5 pounds in 5 days Develop a rescue plan & follow rescue plan if symptoms flare-up Know when to call the doctor Low salt and low carbohydrate diabetic diet Check blood sugars at least daily, keeping log for provider review Schedule and attend Rheumatology appointment   Our next appointment is by telephone on 3/17 at 1015  Please call the care guide team at [phone] if you need to cancel or reschedule your appointment.   If you are experiencing a Mental Health or Behavioral Health Crisis or need someone to talk to, please call the Suicide and Crisis Lifeline: 988 call the [place] National Suicide Prevention Lifeline: 725-583-3841 or TTY: 931-813-5068 TTY 938-484-3402) to talk to a trained counselor call 1-800-273-TALK (toll free, 24 hour hotline) call 911   The patient verbalized understanding of instructions, educational materials, and care plan provided today and declined offer to receive copy of patient instructions, educational materials, and care plan.   Rhae Lerner RN, MSN RN Care Management Coordinator [facility] [phone] [email or web address]

## 2021-11-14 NOTE — Chronic Care Management (AMB) (Signed)
Chronic Care Management   CCM RN Visit Note  11/14/2021 Name: Tanya Cole MRN: [id number] DOB: 31-Jan-1933  Subjective: Tanya Cole is a 88 y.o. year old female who is a primary care patient of Einar Pheasant, MD. The care management team was consulted for assistance with disease management and care coordination needs.    Engaged with patient by telephone for follow up visit in response to provider referral for case management and/or care coordination services.   Consent to Services:  The patient was given information about Chronic Care Management services, agreed to services, and gave verbal consent prior to initiation of services.  Please see initial visit note for detailed documentation.   Patient agreed to services and verbal consent obtained.   Assessment: Review of patient past medical history, allergies, medications, health status, including review of consultants reports, laboratory and other test data, was performed as part of comprehensive evaluation and provision of chronic care management services.   SDOH (Social Determinants of Health) assessments and interventions performed:    CCM Care Plan  Allergies  Allergen Reactions   Tramadol Itching and Nausea And Vomiting    Outpatient Encounter Medications as of 11/12/2021  Medication Sig   alendronate (FOSAMAX) 70 MG tablet Take 70 mg by mouth once a week.    aspirin 81 MG tablet Take 81 mg by mouth daily.   atorvastatin (LIPITOR) 80 MG tablet TAKE 1 TABLET BY MOUTH ONCE DAILY AT  6PM   fluticasone (FLONASE) 50 MCG/ACT nasal spray Use 2 spray(s) in each nostril once daily   fluticasone-salmeterol (ADVAIR) 250-50 MCG/ACT AEPB Inhale 1 puff into the lungs in the morning and at bedtime.   ipratropium-albuterol (DUONEB) 0.5-2.5 (3) MG/3ML SOLN Take 3 mLs by nebulization 2 (two) times daily.   meclizine (ANTIVERT) 25 MG tablet Take 1 tablet (25 mg total) by mouth 3 (three) times daily as needed for dizziness.    metFORMIN (GLUCOPHAGE) 500 MG tablet TAKE 1 TABLET BY MOUTH TWICE DAILY WITH A MEAL   metoprolol tartrate (LOPRESSOR) 25 MG tablet Take 1 tablet by mouth twice daily   nitroGLYCERIN (NITROSTAT) 0.4 MG SL tablet Place 1 tablet (0.4 mg total) under the tongue every 5 (five) minutes as needed for chest pain.   ondansetron (ZOFRAN-ODT) 4 MG disintegrating tablet Take 1 tablet (4 mg total) by mouth every 8 (eight) hours as needed for nausea or vomiting.   predniSONE (DELTASONE) 5 MG tablet Take 3 tablets (15 mg total) by mouth daily with breakfast for 30 days, THEN 2.5 tablets (12.5 mg total) daily with breakfast for 30 days, THEN 2 tablets (10 mg total) daily with breakfast.   PROAIR HFA 108 (90 Base) MCG/ACT inhaler Inhale 2 puffs into the lungs every 4 (four) hours as needed for wheezing or shortness of breath.   Vitamin D, Ergocalciferol, (DRISDOL) 50000 units CAPS capsule Take 50,000 Units by mouth once a week.   No facility-administered encounter medications on file as of 11/12/2021.    Patient Active Problem List   Diagnosis Date Noted   Polyarthritis 10/31/2021   Limited mobility    Dizziness 10/26/2021   History of COVID-19 06/27/2021   COPD (chronic obstructive pulmonary disease) ([place]) 05/21/2021   Hypomagnesemia 05/14/2021   Hypokalemia    Acute respiratory failure with hypoxia (HCC)    Asthma exacerbation    Joint ache 04/09/2021   Calcified granuloma of lung ([place]) 03/02/2021   Acute exacerbation of chronic obstructive pulmonary disease (COPD) ([place]) 03/01/2021   COPD exacerbation (  [place]) 11/29/2020   Chronic diastolic CHF (congestive heart failure) ([place]) 11/29/2020   CAP (community acquired pneumonia) 11/29/2020   Rib pain on right side 10/19/2020   Abnormal CXR 10/19/2020   Type 2 diabetes mellitus with cardiac complication ([place]) 123XX123   Elevated troponin 10/08/2020   Acute on chronic heart failure with preserved ejection fraction (HFpEF) ([place]) 10/08/2020   COPD with acute  exacerbation ([place]) 08/19/2020   History of non-ST elevation myocardial infarction (NSTEMI) 08/07/2020   Asthma 08/07/2020   Leukocytosis 08/07/2020   Left shoulder pain 07/12/2020   Iron deficiency 04/16/2020   Anemia 04/04/2020   SOB (shortness of breath) 03/25/2020   Cough 09/30/2019   Gallstone pancreatitis    Pre-op evaluation 08/30/2019   Cholecystitis 08/05/2019   Choledocholithiasis    Coronary artery disease of native heart with stable angina pectoris ([place]) 12/29/2018   Chest pain 10/07/2018   Memory change 05/27/2018   Osteoporosis 02/05/2018   Weight loss 06/24/2017   Abdominal pain, left lower quadrant 10/05/2016   Polymyalgia rheumatica syndrome ([place]) 05/16/2016   Long term current use of systemic steroids 05/16/2016   Elevated erythrocyte sedimentation rate 05/04/2016   Back pain 04/29/2016   Fatigue 05/24/2015   Health care maintenance 01/25/2015   UTI (urinary tract infection) 07/07/2014   Neuropathy 03/24/2014   Diverticulitis 02/24/2013   Reactive airway disease 09/15/2012   Hypertension 09/14/2012   Hypercholesterolemia 09/14/2012   Diabetes mellitus with cardiac complication ([place]) 99991111    Conditions to be addressed/monitored:CHF, COPD, and DMII  Care Plan : [place]  Updates made by Leona Singleton, RN since 11/14/2021 12:00 AM     Problem: Patient with increase ER and hospitalizations related to shortness of breath COPD exacerbations   Priority: High     Long-Range Goal: Patient will report no emergency room visits within the next 90 days   Start Date: 06/04/2021  Expected End Date: 12/28/2021  This Visit's Progress: Not on track  Recent Progress: Not on track  Priority: High  Note:   Current Barriers:  Knowledge Deficits related to plan of care for management of CHF, COPD, and DMII  Chronic Disease Management support and education needs related to CHF, COPD, and DMII; Patient reports recent ED visit for shoulder pain, she is unsure  what the outcome was (awaiting Rheumatology referral). Blood sugar was around 121 with recent ranges of 120's.  Has not used nebulizer in the last few weeks  Weight 118 pounds RNCM Clinical Goal(s):  Patient will verbalize understanding of plan for management of CHF, COPD, and DMII verbalize basic understanding of CHF, COPD, and DMII disease process and self health management plan to manage conditions take all medications exactly as prescribed and will call provider for medication related questions demonstrate a decrease in CHF, COPD, and DMII exacerbations requiring emergency room visits or hospitalizations experience decrease in ED visits. ED visits in last 6 months = 7 (2 admissions) through collaboration with RN Care manager, provider, and care team.   Interventions: 1:1 collaboration with primary care provider regarding development and update of comprehensive plan of care as evidenced by provider attestation and co-signature Inter-disciplinary care team collaboration (see longitudinal plan of care) Evaluation of current treatment plan related to  self management and patient's adherence to plan as established by provider; Discussed referral placed to Dr. Raul Del for follow up appointment  COPD: (Status: Goal on Track (progressing): YES.)  Long Term Goal Provided patient with basic written and verbal COPD education on self care/management/and  exacerbation prevention; Advised patient to track and manage COPD triggers;  Provided instruction about proper use of medications used for management of COPD including inhalers; Advised patient to self assesses COPD action plan zone and make appointment with provider if in the yellow zone for 48 hours without improvement; Discussed the importance of adequate rest and management of fatigue with COPD; Encouraged patient to use nebulizer if rescue inhaler not helping shortness of breath Discussed contacting providers for shortness of breath or increased  need for rescue inhaler or nebulizer prior to needing to call EMS Encouraged to increase activity as tolerated-  Patient reports that she and her daughter continue to walk every day, about 1/2 mile.  Heart Failure Interventions:  (Status: Goal on Track (progressing): YES.)  Long Term Goal Basic overview and discussion of pathophysiology of Heart Failure reviewed; Provided education on low sodium diet; Reviewed Heart Failure Action Plan in depth and provided written copy; Provided education about placing scale on hard, flat surface; Advised patient to weigh each morning after emptying bladder; Discussed importance of daily weight and advised patient to weigh and record daily; Discussed the importance of keeping all appointments with provider; Discussed when to call provider based on weights and symptoms Fall precautions and preventions reviewed and disussed  Diabetes:  (Status: Goal on Track (progressing): YES.)  Long Term Goal Lab Results  Component Value Date   HGBA1C 6.6 (H) 10/31/2021  Assessed patient's understanding of A1c goal: <7% Provided education to patient about basic DM disease process; Reviewed medications with patient and discussed importance of medication adherence;        Reviewed prescribed diet with patient low salt carb modified diabetic; Advised patient, providing education and rationale, to check cbg daily and record        call provider for findings outside established parameters;        Congratulated on decrease in A1C  Patient Goals/Self-Care Activities: Patient will self administer medications as prescribed Patient will attend all scheduled provider appointments Patient will continue to perform ADL's independently Develop a rescue plan & follow rescue plan if symptoms flare-up Eliminate symptom triggers at home Consider using nebulizer for increase in shortness of breath Contact provider for increase shortness of breath and increased need for rescue inhaler   Keep and attend follow-up appointments Weigh self daily and keep in log for provider review Notify if you gain 2-3 pounds overnight of 5 pounds in 5 days Develop a rescue plan & follow rescue plan if symptoms flare-up Know when to call the doctor Low salt and low carbohydrate diabetic diet Check blood sugars at least daily, keeping log for provider review Schedule and attend Rheumatology appointment      Plan:The care management team will reach out to the patient again over the next 45 days.  Hubert Azure RN, MSN RN Care Management Coordinator [place] [phone] [email or web address]

## 2021-11-15 DIAGNOSIS — M19021 Primary osteoarthritis, right elbow: Secondary | ICD-10-CM | POA: Diagnosis not present

## 2021-11-15 DIAGNOSIS — Z9049 Acquired absence of other specified parts of digestive tract: Secondary | ICD-10-CM

## 2021-11-15 DIAGNOSIS — Z79899 Other long term (current) drug therapy: Secondary | ICD-10-CM

## 2021-11-15 DIAGNOSIS — Z9181 History of falling: Secondary | ICD-10-CM

## 2021-11-15 DIAGNOSIS — M19031 Primary osteoarthritis, right wrist: Secondary | ICD-10-CM | POA: Diagnosis not present

## 2021-11-15 DIAGNOSIS — E119 Type 2 diabetes mellitus without complications: Secondary | ICD-10-CM | POA: Diagnosis not present

## 2021-11-15 DIAGNOSIS — M810 Age-related osteoporosis without current pathological fracture: Secondary | ICD-10-CM

## 2021-11-15 DIAGNOSIS — Z7982 Long term (current) use of aspirin: Secondary | ICD-10-CM

## 2021-11-15 DIAGNOSIS — Z7952 Long term (current) use of systemic steroids: Secondary | ICD-10-CM

## 2021-11-15 DIAGNOSIS — D509 Iron deficiency anemia, unspecified: Secondary | ICD-10-CM

## 2021-11-15 DIAGNOSIS — J449 Chronic obstructive pulmonary disease, unspecified: Secondary | ICD-10-CM | POA: Diagnosis not present

## 2021-11-15 DIAGNOSIS — M8580 Other specified disorders of bone density and structure, unspecified site: Secondary | ICD-10-CM

## 2021-11-15 DIAGNOSIS — I25118 Atherosclerotic heart disease of native coronary artery with other forms of angina pectoris: Secondary | ICD-10-CM | POA: Diagnosis not present

## 2021-11-15 DIAGNOSIS — Z7984 Long term (current) use of oral hypoglycemic drugs: Secondary | ICD-10-CM

## 2021-11-15 DIAGNOSIS — M19011 Primary osteoarthritis, right shoulder: Secondary | ICD-10-CM | POA: Diagnosis not present

## 2021-11-15 DIAGNOSIS — M353 Polymyalgia rheumatica: Secondary | ICD-10-CM

## 2021-11-15 DIAGNOSIS — Z90710 Acquired absence of both cervix and uterus: Secondary | ICD-10-CM

## 2021-11-15 DIAGNOSIS — I252 Old myocardial infarction: Secondary | ICD-10-CM | POA: Diagnosis not present

## 2021-11-15 DIAGNOSIS — I110 Hypertensive heart disease with heart failure: Secondary | ICD-10-CM | POA: Diagnosis not present

## 2021-11-15 DIAGNOSIS — E7800 Pure hypercholesterolemia, unspecified: Secondary | ICD-10-CM

## 2021-11-15 DIAGNOSIS — I5032 Chronic diastolic (congestive) heart failure: Secondary | ICD-10-CM | POA: Diagnosis not present

## 2021-11-15 DIAGNOSIS — Z955 Presence of coronary angioplasty implant and graft: Secondary | ICD-10-CM

## 2021-11-20 DIAGNOSIS — E118 Type 2 diabetes mellitus with unspecified complications: Secondary | ICD-10-CM | POA: Diagnosis not present

## 2021-11-23 ENCOUNTER — Telehealth: Payer: Self-pay | Admitting: Internal Medicine

## 2021-11-23 NOTE — Telephone Encounter (Signed)
Someone named Barbara CowerJason called in regards to pt about diabetic shoes. Barbara CowerJason can be reached at [phone]

## 2021-11-23 NOTE — Telephone Encounter (Signed)
Have not received anything about diabetic shoes. Patient did not request.

## 2021-11-26 ENCOUNTER — Other Ambulatory Visit: Payer: Self-pay

## 2021-11-26 ENCOUNTER — Ambulatory Visit (INDEPENDENT_AMBULATORY_CARE_PROVIDER_SITE_OTHER): Payer: Medicare HMO

## 2021-11-26 DIAGNOSIS — R0989 Other specified symptoms and signs involving the circulatory and respiratory systems: Secondary | ICD-10-CM

## 2021-11-29 ENCOUNTER — Ambulatory Visit (INDEPENDENT_AMBULATORY_CARE_PROVIDER_SITE_OTHER): Payer: Medicare HMO | Admitting: Internal Medicine

## 2021-11-29 ENCOUNTER — Other Ambulatory Visit: Payer: Self-pay

## 2021-11-29 ENCOUNTER — Encounter: Payer: Self-pay | Admitting: Internal Medicine

## 2021-11-29 VITALS — BP 140/78 | HR 85 | Temp 98.0°F | Resp 16 | Ht 61.0 in | Wt 124.0 lb

## 2021-11-29 DIAGNOSIS — E1159 Type 2 diabetes mellitus with other circulatory complications: Secondary | ICD-10-CM

## 2021-11-29 DIAGNOSIS — M25539 Pain in unspecified wrist: Secondary | ICD-10-CM | POA: Insufficient documentation

## 2021-11-29 DIAGNOSIS — J8410 Pulmonary fibrosis, unspecified: Secondary | ICD-10-CM

## 2021-11-29 DIAGNOSIS — I5032 Chronic diastolic (congestive) heart failure: Secondary | ICD-10-CM | POA: Diagnosis not present

## 2021-11-29 DIAGNOSIS — M353 Polymyalgia rheumatica: Secondary | ICD-10-CM

## 2021-11-29 DIAGNOSIS — I25118 Atherosclerotic heart disease of native coronary artery with other forms of angina pectoris: Secondary | ICD-10-CM

## 2021-11-29 DIAGNOSIS — D509 Iron deficiency anemia, unspecified: Secondary | ICD-10-CM | POA: Diagnosis not present

## 2021-11-29 DIAGNOSIS — E538 Deficiency of other specified B group vitamins: Secondary | ICD-10-CM

## 2021-11-29 DIAGNOSIS — D72829 Elevated white blood cell count, unspecified: Secondary | ICD-10-CM

## 2021-11-29 DIAGNOSIS — J449 Chronic obstructive pulmonary disease, unspecified: Secondary | ICD-10-CM

## 2021-11-29 DIAGNOSIS — R42 Dizziness and giddiness: Secondary | ICD-10-CM

## 2021-11-29 DIAGNOSIS — M810 Age-related osteoporosis without current pathological fracture: Secondary | ICD-10-CM

## 2021-11-29 DIAGNOSIS — I10 Essential (primary) hypertension: Secondary | ICD-10-CM

## 2021-11-29 DIAGNOSIS — E7800 Pure hypercholesterolemia, unspecified: Secondary | ICD-10-CM

## 2021-11-29 DIAGNOSIS — M25531 Pain in right wrist: Secondary | ICD-10-CM

## 2021-11-29 LAB — HM DIABETES FOOT EXAM

## 2021-11-29 NOTE — Progress Notes (Signed)
[name], female   DOB: 1933-03-11, 88 y.o.   MRN: [id number]   Subjective:    [name], female    DOB: 1933-05-26, 88 y.o.   MRN: [id number]  This visit occurred during the SARS-CoV-2 public health emergency.  Safety protocols were in place, including screening questions prior to the visit, additional usage of staff PPE, and extensive cleaning of exam room while observing appropriate contact time as indicated for disinfecting solutions.   Patient here for a scheduled follow up.    Chief Complaint  Patient presents with   COPD   Diabetes   Hyperlipidemia   .   HPI Was recently admitted 10/31/21-11/01/21 and diagosed with PMR.  Presented with painful right shoulder with swelling.  Received IV steroids.  Per discharge note, presentation felt to most likley be related to reactivation PMR.  Was started on prednisone with recommendation for f/u with rheumatology. She reports pain has improved.  Swelling improved.  Taking prednisone.  Breathing stable.  No increased cough or congestion.  No acid reflux.  No abdominal pain.  Bowels moving.     Past Medical History:  Diagnosis Date   (HFpEF) heart failure with preserved ejection fraction ([place])    a. TTE 12/19: EF 55-60%, probable HK of the mid apical anterior septal myocardium, Gr1DD, mild AI, mildly dilated LA; b.07/2020 Echo: EF 60-65%, no rwma, Gr2 DD. Nl RV fxn. Mildly dil LA. Mild MR.   Asthma    CAD (coronary artery disease)    a. 09/2018 NSTEMI/PCI: LM min irregs, mLAD 95 (PCI/DES), mLAD-2 60%, LCx mild diff dzs, RCA min irregs; b. 03/2020 MV: EF>65%, no ischemia/scar; c. 07/2020 Cath: LM min irregs, LAD 30p, 70m 90d, D1/2 min irregs, LCX diff dzs throughout, OM1/2/3 mild dzs, RCA 30p, RPDA/RPAV min irrges. EF 55-65%.   CHF (congestive heart failure) ([facility]    Diabetes mellitus ([facility]    Hypercholesterolemia    Hypertension    Myocardial infarction ([facility]    Osteopenia    Palpitations    a. 04/2020  [facility]: Avg HR 75. 429 SVT episodes, longest 19 secs @ 133. Occas PACs (3.2%). Rare PVCs (<1%).   Polymyalgia rheumatica syndrome (HCC)    Reactive airway disease    Severe sepsis ([facility]    Past Surgical History:  Procedure Laterality Date   ABDOMINAL HYSTERECTOMY  1981   prolapse and bleeding, ovaries not removed   BREAST EXCISIONAL BIOPSY Right    CHOLECYSTECTOMY N/A 09/02/2019   Procedure: LAPAROSCOPIC CHOLECYSTECTOMY WITH INTRAOPERATIVE CHOLANGIOGRAM;  Surgeon: POlean Ree MD;  Location: [facility] OR[facility];  Service: General;  Laterality: N/A;   CORONARY STENT INTERVENTION N/A 10/08/2018   Procedure: CORONARY STENT INTERVENTION;  Surgeon: AWellington [place] MD;  [facility];  Service: Cardiovascular;  Laterality: N/A;   ENDOSCOPIC RETROGRADE CHOLANGIOPANCREATOGRAPHY (ERCP) WITH PROPOFOL N/A 08/08/2019   Procedure: ENDOSCOPIC RETROGRADE CHOLANGIOPANCREATOGRAPHY (ERCP) WITH PROPOFOL;  Surgeon: WLucilla Lame MD;  Location: [facility] END[facility]COPY;  Service: Endoscopy;  Laterality: N/A;   LEFT HEART CATH AND CORONARY ANGIOGRAPHY N/A 10/08/2018   Procedure: LEFT HEART CATH AND CORONARY ANGIOGRAPHY;  Surgeon: AWellington [place] MD;  [facility];  Service: Cardiovascular;  Laterality: N/A;   LEFT HEART CATH AND CORONARY ANGIOGRAPHY N/A 08/10/2020   Procedure: LEFT HEART CATH AND CORONARY ANGIOGRAPHY possible percutaneous intervention;  Surgeon: AWellington Hampshire MD;  [facility];  Service: Cardiovascular;  Laterality: N/A;   UMBILICAL HERNIA REPAIR  7/94  Family History  Problem Relation Age of Onset   Arthritis Mother    Heart disease Mother    Heart attack Father    Throat cancer Sister    Parkinson's disease Sister    COPD Brother    Social History   Socioeconomic History   Marital status: Widowed    Spouse name: Not on file   Number of children: 3   Years of education: Not on file   Highest education level: Not on file  Occupational History    Not on file  Tobacco Use   Smoking status: Never   Smokeless tobacco: Never  Vaping Use   Vaping Use: Never used  Substance and Sexual Activity   Alcohol use: No    Alcohol/week: 0.0 standard drinks   Drug use: No   Sexual activity: Not Currently  Other Topics Concern   Not on file  Social History Narrative   No smoking; no alcohol; in ; worked in Char[employer] fundraiser. Lives by self in [place]. Does all of her own housework. Dtr does food shopping for her.   Social Determinants of Health   Financial Resource Strain: Low Risk    Difficulty of Paying Living Expenses: Not hard at all  Food Insecurity: No Food Insecurity   Worried About Charity fundraiser in the Last Year: Never true   [place] in the Last Year: Never true  Transportation Needs: No Transportation Needs   Lack of Transportation (Medical): No   Lack of Transportation (Non-Medical): No  Physical Activity: Not on file  Stress: No Stress Concern Present   Feeling of Stress : Not at all  Social Connections: Moderately Integrated   Frequency of Communication with Friends and Family: More than three times a week   Frequency of Social Gatherings with Friends and Family: More than three times a week   Attends Religious Services: More than 4 times per year   Active Member of [employer] or Organizations: Yes   Attends Archivist Meetings: Not on file   Marital Status: Widowed     Review of Systems  Constitutional:  Negative for appetite change and unexpected weight change.  HENT:  Negative for congestion and sinus pressure.   Respiratory:  Negative for cough and chest tightness.        Breathing stable.   Cardiovascular:  Negative for chest pain, palpitations and leg swelling.  Gastrointestinal:  Negative for abdominal pain, diarrhea, nausea and vomiting.  Genitourinary:  Negative for difficulty urinating and dysuria.  Musculoskeletal:  Negative for myalgias.       Pain and swelling have improved as  outlined.   Skin:  Negative for color change and rash.  Neurological:  Negative for dizziness, light-headedness and headaches.  Psychiatric/Behavioral:  Negative for agitation and dysphoric mood.       Objective:     BP 140/78    Pulse 85    Temp 98 F (36.7 C)    Resp 16    Ht _0  (1.549 m)    Wt 124 lb (56.2 kg)    SpO2 98%    BMI 23.43 kg/m  Wt Readings from Last 3 Encounters:  11/29/21 124 lb (56.2 kg)  10/31/21 118 lb (53.5 kg)  10/26/21 122 lb 9.2 oz (55.6 kg)    Physical Exam Vitals reviewed.  Constitutional:      General: She is not in acute distress.    Appearance: Normal appearance.  HENT:     Head: Normocephalic  and atraumatic.     Right Ear: External ear normal.     Left Ear: External ear normal.  Eyes:     General: No scleral icterus.       Right eye: No discharge.        Left eye: No discharge.     Conjunctiva/sclera: Conjunctivae normal.  Neck:     Thyroid: No thyromegaly.  Cardiovascular:     Rate and Rhythm: Normal rate and regular rhythm.  Pulmonary:     Effort: No respiratory distress.     Breath sounds: Normal breath sounds. No wheezing.  Abdominal:     General: Bowel sounds are normal.     Palpations: Abdomen is soft.     Tenderness: There is no abdominal tenderness.  Musculoskeletal:        General: No swelling or tenderness.     Cervical back: Neck supple. No tenderness.  Lymphadenopathy:     Cervical: No cervical adenopathy.  Skin:    Findings: No erythema or rash.  Neurological:     Mental Status: She is alert.  Psychiatric:        Mood and Affect: Mood normal.        Behavior: Behavior normal.     Outpatient Encounter Medications as of 11/29/2021  Medication Sig   alendronate (FOSAMAX) 70 MG tablet Take 70 mg by mouth once a week.    aspirin 81 MG tablet Take 81 mg by mouth daily.   atorvastatin (LIPITOR) 80 MG tablet TAKE 1 TABLET BY MOUTH ONCE DAILY AT  6PM   fluticasone (FLONASE) 50 MCG/ACT nasal spray Use 2 spray(s) in each  nostril once daily   fluticasone-salmeterol (ADVAIR) 250-50 MCG/ACT AEPB Inhale 1 puff into the lungs in the morning and at bedtime.   ipratropium-albuterol (DUONEB) 0.5-2.5 (3) MG/3ML SOLN Take 3 mLs by nebulization 2 (two) times daily.   meclizine (ANTIVERT) 25 MG tablet Take 1 tablet (25 mg total) by mouth 3 (three) times daily as needed for dizziness.   metFORMIN (GLUCOPHAGE) 500 MG tablet TAKE 1 TABLET BY MOUTH TWICE DAILY WITH A MEAL   metoprolol tartrate (LOPRESSOR) 25 MG tablet Take 1 tablet by mouth twice daily   nitroGLYCERIN (NITROSTAT) 0.4 MG SL tablet Place 1 tablet (0.4 mg total) under the tongue every 5 (five) minutes as needed for chest pain.   ondansetron (ZOFRAN-ODT) 4 MG disintegrating tablet Take 1 tablet (4 mg total) by mouth every 8 (eight) hours as needed for nausea or vomiting.   predniSONE (DELTASONE) 5 MG tablet Take 3 tablets (15 mg total) by mouth daily with breakfast for 30 days, THEN 2.5 tablets (12.5 mg total) daily with breakfast for 30 days, THEN 2 tablets (10 mg total) daily with breakfast.   PROAIR HFA 108 (90 Base) MCG/ACT inhaler Inhale 2 puffs into the lungs every 4 (four) hours as needed for wheezing or shortness of breath.   Vitamin D, Ergocalciferol, (DRISDOL) 50000 units CAPS capsule Take 50,000 Units by mouth once a week.   [EXPIRED] cyanocobalamin ((VITAMIN B-12)) injection 1,000 mcg    No facility-administered encounter medications on file as of 11/29/2021.     Lab Results  Component Value Date   WBC 11.3 (H) 11/29/2021   HGB 12.4 11/29/2021   HCT 37.0 11/29/2021   PLT 220.0 11/29/2021   GLUCOSE 190 (H) 11/01/2021   CHOL 175 06/27/2021   TRIG 113 06/27/2021   HDL 44 06/27/2021   LDLDIRECT 136.0 07/03/2020   LDLCALC 108 (H) 06/27/2021  ALT 10 11/01/2021   AST 13 (L) 11/01/2021   NA 135 11/01/2021   K 3.9 11/01/2021   CL 101 11/01/2021   CREATININE 0.85 11/01/2021   BUN 29 (H) 11/01/2021   CO2 25 11/01/2021   TSH 2.50 07/23/2021   INR  1.1 08/07/2020   HGBA1C 6.6 (H) 10/31/2021   MICROALBUR 1.1 07/23/2021    DG Shoulder Right  Result Date: 11/01/2021 CLINICAL DATA:  Right shoulder pain EXAM: RIGHT SHOULDER - 2+ VIEW COMPARISON:  None FINDINGS: No signs of acute fracture or dislocation. Mild degenerative changes identified at the acromioclavicular joint. No radiopaque foreign bodies. IMPRESSION: 1. No acute findings. 2. AC joint osteoarthritis. Electronically Signed   By: Kerby Moors M.D.   On: 11/01/2021 11:40   ECHOCARDIOGRAM COMPLETE  Result Date: 11/01/2021    ECHOCARDIOGRAM REPORT   Patient Name:   Tanya [place] Date of Exam: 11/01/2021 Medical Rec #:  [id number]          Height:       61.0 in Accession #:    [id number]         Weight:       118.0 lb Date of Birth:  1932/12/06          BSA:          1.509 m Patient Age:    84 years           BP:           131/62 mmHg Patient Gender: F                  HR:           72 bpm. Exam Location:  [facility] Procedure: 2D Echo, Color Doppler and Cardiac Doppler Indications:     I25.10 coronary artery disease Native Vessel  History:         Patient has prior history of Echocardiogram examinations, most                  recent 06/28/2021. HFpEF.  Sonographer:     Charmayne Sheer Referring Phys:  [id number] Emeterio Reeve Diagnosing Phys: Nelva Bush MD  Sonographer Comments: Suboptimal subcostal window. IMPRESSIONS  1. Left ventricular ejection fraction, by estimation, is 60 to 65%. The left ventricle has normal function. The left ventricle has no regional wall motion abnormalities. There is mild left ventricular hypertrophy of the basal-septal segment. Left ventricular diastolic parameters are indeterminate.  2. Right ventricular systolic function is normal. The right ventricular size is normal. Mildly increased right ventricular wall thickness. There is moderately elevated pulmonary artery systolic pressure.  3. Left atrial size was mildly dilated.  4. The mitral valve is degenerative. Mild  mitral valve regurgitation. Mild mitral stenosis.  5. The aortic valve has an indeterminant number of cusps. There is mild calcification of the aortic valve. There is mild thickening of the aortic valve. Aortic valve regurgitation is not visualized. Aortic valve sclerosis/calcification is present, without any evidence of aortic stenosis.  6. The inferior vena cava is normal in size with <50% respiratory variability, suggesting right atrial pressure of 8 mmHg. FINDINGS  Left Ventricle: Left ventricular ejection fraction, by estimation, is 60 to 65%. The left ventricle has normal function. The left ventricle has no regional wall motion abnormalities. The left ventricular internal cavity size was normal in size. There is  mild left ventricular hypertrophy of the basal-septal segment. Left ventricular diastolic parameters are indeterminate. Right Ventricle: The right ventricular size is  normal. Mildly increased right ventricular wall thickness. Right ventricular systolic function is normal. There is moderately elevated pulmonary artery systolic pressure. The tricuspid regurgitant velocity is 3.54 m/s, and with an assumed right atrial pressure of 8 mmHg, the estimated right ventricular systolic pressure is 50.2 mmHg. Left Atrium: Left atrial size was mildly dilated. Right Atrium: Right atrial size was normal in size. Pericardium: There is no evidence of pericardial effusion. Presence of epicardial fat layer. Mitral Valve: The mitral valve is degenerative in appearance. There is mild thickening of the mitral valve leaflet(s). Mild mitral annular calcification. Mild mitral valve regurgitation. Mild mitral valve stenosis. MV peak gradient, 13.5 mmHg. The mean mitral valve gradient is 5.0 mmHg. Tricuspid Valve: The tricuspid valve is not well visualized. Tricuspid valve regurgitation is mild. Aortic Valve: The aortic valve has an indeterminant number of cusps. There is mild calcification of the aortic valve. There is mild  thickening of the aortic valve. Aortic valve regurgitation is not visualized. Aortic valve sclerosis/calcification is present, without any evidence of aortic stenosis. Aortic valve mean gradient measures 7.0 mmHg. Aortic valve peak gradient measures 15.7 mmHg. Aortic valve area, by VTI measures 1.73 cm. Pulmonic Valve: The pulmonic valve was not well visualized. Pulmonic valve regurgitation is not visualized. No evidence of pulmonic stenosis. Aorta: The aortic root and ascending aorta are structurally normal, with no evidence of dilitation. Pulmonary Artery: The pulmonary artery is of normal size. Venous: The inferior vena cava is normal in size with less than 50% respiratory variability, suggesting right atrial pressure of 8 mmHg. IAS/Shunts: No atrial level shunt detected by color flow Doppler.  LEFT VENTRICLE PLAX 2D LVIDd:         3.63 cm   Diastology LVIDs:         2.31 cm   LV e' medial:    8.81 cm/s LV PW:         0.97 cm   LV E/e' medial:  17.6 LV IVS:        1.04 cm   LV e' lateral:   9.90 cm/s LVOT diam:     2.00 cm   LV E/e' lateral: 15.7 LV SV:         63 LV SV Index:   42 LVOT Area:     3.14 cm  RIGHT VENTRICLE RV Basal diam:  2.91 cm LEFT ATRIUM             Index        RIGHT ATRIUM          Index LA diam:        3.70 cm 2.45 cm/m   RA Area:     9.49 cm LA Vol (A2C):   57.0 ml 37.76 ml/m  RA Volume:   17.50 ml 11.59 ml/m LA Vol (A4C):   46.2 ml 30.61 ml/m LA Biplane Vol: 53.3 ml 35.31 ml/m  AORTIC VALVE                     PULMONIC VALVE AV Area (Vmax):    1.78 cm      PV Vmax:       0.95 m/s AV Area (Vmean):   1.92 cm      PV Vmean:      69.700 cm/s AV Area (VTI):     1.73 cm      PV VTI:        0.201 m AV Vmax:  198.00 cm/s   PV Peak grad:  3.6 mmHg AV Vmean:          123.000 cm/s  PV Mean grad:  2.0 mmHg AV VTI:            0.363 m AV Peak Grad:      15.7 mmHg AV Mean Grad:      7.0 mmHg LVOT Vmax:         112.00 cm/s LVOT Vmean:        75.100 cm/s LVOT VTI:          0.200 m  LVOT/AV VTI ratio: 0.55  AORTA Ao Root diam: 3.10 cm MITRAL VALVE                TRICUSPID VALVE MV Area (PHT): 3.66 cm     TR Peak grad:   50.1 mmHg MV Area VTI:   1.71 cm     TR Vmax:        354.00 cm/s MV Peak grad:  13.5 mmHg MV Mean grad:  5.0 mmHg     SHUNTS MV Vmax:       1.84 m/s     Systemic VTI:  0.20 m MV Vmean:      108.0 cm/s   Systemic Diam: 2.00 cm MV Decel Time: 207 msec MV E velocity: 155.00 cm/s MV A velocity: 99.00 cm/s MV E/A ratio:  1.57 Nelva Bush MD Electronically signed by Nelva Bush MD Signature Date/Time: 11/01/2021/10:21:59 AM    Final        Assessment & Plan:   Problem List Items Addressed This Visit     Anemia - Primary    Has seen hematology.  Follow cbc and iron studies.       Relevant Orders   Vitamin B12 (Completed)   Calcified granuloma of lung ([place])    F/u CT 05/2021 - reviewed.  Seeing pulmonary.       Chronic diastolic CHF (congestive heart failure) (HCC)    No evidence of volume overload on exam.  Breathing stable.  Follow.       COPD (chronic obstructive pulmonary disease) (HCC)    Breathing stable.  Continue home inhalers.       Coronary artery disease of native heart with stable angina pectoris ([place])    Known CAD s/p PCI/DES - mid LAD.  Taking metoprolol.  Has NTG if needed.  No chest pain.        Dizziness    Resolved.  Denies any dizziness currently.       Hypercholesterolemia    Continue lipitor.  Low cholesterol diet and exercise.  Follow lipid panel and liver function tests.        Hypertension    Continue lopressor.  Follow pressure.  Follow metabolic panel.       Leukocytosis    Elevated while in [place].  Recheck cbc today.  She is on steroids.       Relevant Orders   CBC with Differential/Platelet (Completed)   Osteoporosis    Continue alendronate and vitamin D.       Polymyalgia rheumatica syndrome (HCC)    Recently admitted and diagnosed with PMR.  On prednisone.  Symptoms improved.  Needs f/u with  rheumatology.        Relevant Orders   Ambulatory referral to Rheumatology   Type 2 diabetes mellitus with cardiac complication (HCC)    Low carb diet and exercise.  Follow met b and a1c.  Wrist pain   Relevant Orders   Sedimentation rate (Completed)   Other Visit Diagnoses     B12 deficiency       Relevant Medications   cyanocobalamin ((VITAMIN B-12)) injection 1,000 mcg (Completed)        Einar Pheasant, MD

## 2021-11-30 DIAGNOSIS — J449 Chronic obstructive pulmonary disease, unspecified: Secondary | ICD-10-CM | POA: Diagnosis not present

## 2021-11-30 DIAGNOSIS — I10 Essential (primary) hypertension: Secondary | ICD-10-CM | POA: Diagnosis not present

## 2021-11-30 DIAGNOSIS — M25531 Pain in right wrist: Secondary | ICD-10-CM | POA: Diagnosis not present

## 2021-11-30 DIAGNOSIS — D509 Iron deficiency anemia, unspecified: Secondary | ICD-10-CM | POA: Diagnosis not present

## 2021-11-30 DIAGNOSIS — E538 Deficiency of other specified B group vitamins: Secondary | ICD-10-CM

## 2021-11-30 DIAGNOSIS — I25118 Atherosclerotic heart disease of native coronary artery with other forms of angina pectoris: Secondary | ICD-10-CM | POA: Diagnosis not present

## 2021-11-30 DIAGNOSIS — E1159 Type 2 diabetes mellitus with other circulatory complications: Secondary | ICD-10-CM | POA: Diagnosis not present

## 2021-11-30 DIAGNOSIS — I5032 Chronic diastolic (congestive) heart failure: Secondary | ICD-10-CM | POA: Diagnosis not present

## 2021-11-30 DIAGNOSIS — D72829 Elevated white blood cell count, unspecified: Secondary | ICD-10-CM | POA: Diagnosis not present

## 2021-11-30 LAB — CBC WITH DIFFERENTIAL/PLATELET
Basophils Absolute: 0.1 10*3/uL (ref 0.0–0.1)
Basophils Relative: 1.1 % (ref 0.0–3.0)
Eosinophils Absolute: 0.5 10*3/uL (ref 0.0–0.7)
Eosinophils Relative: 4.6 % (ref 0.0–5.0)
HCT: 37 % (ref 36.0–46.0)
Hemoglobin: 12.4 g/dL (ref 12.0–15.0)
Lymphocytes Relative: 20.9 % (ref 12.0–46.0)
Lymphs Abs: 2.4 10*3/uL (ref 0.7–4.0)
MCHC: 33.5 g/dL (ref 30.0–36.0)
MCV: 85.3 fl (ref 78.0–100.0)
Monocytes Absolute: 0.9 10*3/uL (ref 0.1–1.0)
Monocytes Relative: 7.8 % (ref 3.0–12.0)
Neutro Abs: 7.4 10*3/uL (ref 1.4–7.7)
Neutrophils Relative %: 65.6 % (ref 43.0–77.0)
Platelets: 220 10*3/uL (ref 150.0–400.0)
RBC: 4.34 Mil/uL (ref 3.87–5.11)
RDW: 15.1 % (ref 11.5–15.5)
WBC: 11.3 10*3/uL — ABNORMAL HIGH (ref 4.0–10.5)

## 2021-11-30 LAB — VITAMIN B12: Vitamin B-12: 402 pg/mL (ref 211–911)

## 2021-11-30 LAB — SEDIMENTATION RATE: Sed Rate: 27 mm/hr (ref 0–30)

## 2021-11-30 MED ORDER — CYANOCOBALAMIN 1000 MCG/ML IJ SOLN
1000.0000 ug | Freq: Once | INTRAMUSCULAR | Status: AC
  Administered 2021-11-30: 1000 ug via INTRAMUSCULAR

## 2021-12-12 ENCOUNTER — Encounter: Payer: Self-pay | Admitting: Internal Medicine

## 2021-12-12 NOTE — Assessment & Plan Note (Signed)
Continue alendronate and vitamin D.

## 2021-12-12 NOTE — Assessment & Plan Note (Signed)
No evidence of volume overload on exam.  Breathing stable.  Follow.  

## 2021-12-12 NOTE — Assessment & Plan Note (Signed)
Recently admitted and diagnosed with PMR.  On prednisone.  Symptoms improved.  Needs f/u with rheumatology.

## 2021-12-12 NOTE — Assessment & Plan Note (Signed)
Known CAD s/p PCI/DES - mid LAD.  Taking metoprolol.  Has NTG if needed.  No chest pain.

## 2021-12-12 NOTE — Assessment & Plan Note (Signed)
Breathing stable.  Continue home inhalers.

## 2021-12-12 NOTE — Assessment & Plan Note (Signed)
Elevated while in hospital.  Recheck cbc today.  She is on steroids.

## 2021-12-12 NOTE — Assessment & Plan Note (Signed)
Resolved.  Denies any dizziness currently.

## 2021-12-12 NOTE — Assessment & Plan Note (Signed)
F/u CT 05/2021 - reviewed.  Seeing pulmonary.

## 2021-12-12 NOTE — Assessment & Plan Note (Signed)
Low carb diet and exercise.  Follow met b and a1c.

## 2021-12-12 NOTE — Assessment & Plan Note (Signed)
Continue lipitor.  Low cholesterol diet and exercise.  Follow lipid panel and liver function tests.   

## 2021-12-12 NOTE — Assessment & Plan Note (Signed)
Has seen hematology.  Follow cbc and iron studies.  

## 2021-12-12 NOTE — Assessment & Plan Note (Signed)
Continue lopressor.  Follow pressure.  Follow metabolic panel.

## 2021-12-22 ENCOUNTER — Encounter: Payer: Self-pay | Admitting: Internal Medicine

## 2021-12-26 ENCOUNTER — Emergency Department: Payer: 59

## 2021-12-26 ENCOUNTER — Emergency Department
Admission: EM | Admit: 2021-12-26 | Discharge: 2021-12-26 | Disposition: A | Discharge status: Home / Self Care | Payer: 59 | Attending: Student in an Organized Health Care Education/Training Program | Admitting: Student in an Organized Health Care Education/Training Program

## 2021-12-26 ENCOUNTER — Other Ambulatory Visit: Payer: Self-pay

## 2021-12-26 DIAGNOSIS — I2510 Atherosclerotic heart disease of native coronary artery without angina pectoris: Secondary | ICD-10-CM | POA: Insufficient documentation

## 2021-12-26 DIAGNOSIS — R1013 Epigastric pain: Secondary | ICD-10-CM | POA: Diagnosis present

## 2021-12-26 DIAGNOSIS — R079 Chest pain, unspecified: Secondary | ICD-10-CM | POA: Insufficient documentation

## 2021-12-26 DIAGNOSIS — D72829 Elevated white blood cell count, unspecified: Secondary | ICD-10-CM | POA: Insufficient documentation

## 2021-12-26 DIAGNOSIS — R0602 Shortness of breath: Secondary | ICD-10-CM | POA: Diagnosis not present

## 2021-12-26 LAB — CBC
HCT: 39.7 % (ref 36.0–46.0)
Hemoglobin: 12.7 g/dL (ref 12.0–15.0)
MCH: 27.7 pg (ref 26.0–34.0)
MCHC: 32 g/dL (ref 30.0–36.0)
MCV: 86.7 fL (ref 80.0–100.0)
Platelets: 220 10*3/uL (ref 150–400)
RBC: 4.58 MIL/uL (ref 3.87–5.11)
RDW: 15.1 % (ref 11.5–15.5)
WBC: 17.3 10*3/uL — ABNORMAL HIGH (ref 4.0–10.5)
nRBC: 0 % (ref 0.0–0.2)

## 2021-12-26 LAB — BASIC METABOLIC PANEL
Anion gap: 13 (ref 5–15)
BUN: 17 mg/dL (ref 8–23)
CO2: 23 mmol/L (ref 22–32)
Calcium: 8.8 mg/dL — ABNORMAL LOW (ref 8.9–10.3)
Chloride: 103 mmol/L (ref 98–111)
Creatinine, Ser: 0.71 mg/dL (ref 0.44–1.00)
GFR, Estimated: >60 mL/min (ref >60)
Glucose, Bld: 206 mg/dL — ABNORMAL HIGH (ref 70–99)
Potassium: 4 mmol/L (ref 3.5–5.1)
Sodium: 139 mmol/L (ref 135–145)

## 2021-12-26 LAB — HEPATIC FUNCTION PANEL
ALT: 24 U/L (ref 0–44)
AST: 27 U/L (ref 15–41)
Albumin: 3.8 g/dL (ref 3.5–5.0)
Alkaline Phosphatase: 55 U/L (ref 38–126)
Bilirubin, Direct: 0.2 mg/dL (ref 0.0–0.2)
Indirect Bilirubin: 0.6 mg/dL (ref 0.3–0.9)
Total Bilirubin: 0.8 mg/dL (ref 0.3–1.2)
Total Protein: 6 g/dL — ABNORMAL LOW (ref 6.5–8.1)

## 2021-12-26 LAB — LIPASE, BLOOD: Lipase: 32 U/L (ref 11–51)

## 2021-12-26 LAB — TROPONIN I (HIGH SENSITIVITY)
Troponin I (High Sensitivity): 8 ng/L (ref <18)
Troponin I (High Sensitivity): 7 ng/L (ref <18)

## 2021-12-26 MED ORDER — SODIUM CHLORIDE 0.9 % IV BOLUS
500.0000 mL | Freq: Once | INTRAVENOUS | Status: AC
  Administered 2021-12-26: 500 mL via INTRAVENOUS

## 2021-12-26 MED ORDER — IOHEXOL 300 MG/ML  SOLN
80.0000 mL | Freq: Once | INTRAMUSCULAR | Status: AC | PRN
  Administered 2021-12-26: 80 mL via INTRAVENOUS

## 2021-12-26 NOTE — ED Provider Notes (Signed)
Tanya Cole    Event Date/Time   First MD Initiated Contact with Patient 12/26/21 1254     (approximate)   History   Chest Pain and Shortness of Breath   HPI  Tanya Cole is a 88 y.o. female with a history of CAD presents to the ER for evaluation of epigastric pain and some shortness of breath.  Denies any pain with deep inspiration.  States it is keeping her awake last night.  No measured fevers no nausea or vomiting.  Does not travel.  No sick contacts.  Denies any palpitations no cough or congestion.  She status post cholecystectomy.  Has been put recently on methotrexate as well as prednisone and folic acid for her RA     Physical Exam   Triage Vital Signs: ED Triage Vitals  Enc Vitals Group     BP 12/26/21 1043 (!) 142/69     Pulse Rate 12/26/21 1043 61     Resp 12/26/21 1043 18     Temp 12/26/21 1043 98.4 F (36.9 C)     Temp Source 12/26/21 1043 Oral     SpO2 12/26/21 1043 97 %     Weight 12/26/21 1041 118 lb (53.5 kg)     Height 12/26/21 1041 5\' 1"  (1.549 m)     Head Circumference --      Peak Flow --      Pain Score 12/26/21 1040 0     Pain Loc --      Pain Edu? --      Excl. in [place]? --     Most recent vital signs: Vitals:   12/26/21 1043 12/26/21 1230  BP: (!) 142/69 135/74  Pulse: 61 63  Resp: 18 (!) 22  Temp: 98.4 F (36.9 C) 98.5 F (36.9 C)  SpO2: 97% 97%     Constitutional: Alert  Eyes: Conjunctivae are normal.  Head: Atraumatic. Nose: No congestion/rhinnorhea. Mouth/Throat: Mucous membranes are moist.   Neck: Painless ROM.  Cardiovascular:   Good peripheral circulation. Respiratory: Normal respiratory effort.  No retractions.  Gastrointestinal: Soft with mild epigastric tenderness to palpation. Musculoskeletal:  no deformity Neurologic:  MAE spontaneously. No gross focal neurologic deficits are appreciated.  Skin:  Skin is warm, dry and intact. No rash noted. Psychiatric: Mood and affect  are normal. Speech and behavior are normal.    ED Results / Procedures / Treatments   Labs (all labs ordered are listed, but only abnormal results are displayed) Labs Reviewed  BASIC METABOLIC PANEL - Abnormal; Notable for the following components:      Result Value   Glucose, Bld 206 (*)    Calcium 8.8 (*)    All other components within normal limits  CBC - Abnormal; Notable for the following components:   WBC 17.3 (*)    All other components within normal limits  HEPATIC FUNCTION PANEL - Abnormal; Notable for the following components:   Total Protein 6.0 (*)    All other components within normal limits  LIPASE, BLOOD  TROPONIN I (HIGH SENSITIVITY)  TROPONIN I (HIGH SENSITIVITY)     EKG  ED ECG REPORT I, Merlyn Lot, the attending physician, personally viewed and interpreted this ECG.   Date: 12/26/2021  EKG Time: 10:40  Rate: 60  Rhythm: sinus  Axis: normal  Intervals: short pr  ST&T Change: no stemi, no depression    RADIOLOGY Please see ED Course for my review and interpretation.  I personally reviewed all  radiographic images ordered to evaluate for the above acute complaints and reviewed radiology reports and findings.  These findings were personally discussed with the patient.  Please see medical record for radiology report.    PROCEDURES:  Critical Care performed: No  Procedures   MEDICATIONS ORDERED IN ED: Medications  sodium chloride 0.9 % bolus 500 mL (0 mLs Intravenous Stopped 12/26/21 1422)  iohexol (OMNIPAQUE) 300 MG/ML solution 80 mL (80 mLs Intravenous Contrast Given 12/26/21 1405)     IMPRESSION / MDM / ASSESSMENT AND PLAN / ED COURSE  I reviewed the triage vital signs and the nursing notes.                              Differential diagnosis includes, but is not limited to, gastritis, enteritis, pancreatitis, mass, obstruction, ACS, pneumonia, CHF, pericarditis  Presenting with symptoms as described above.  She clinically  well-appearing in no acute distress afebrile.  Does have some mild epigastric tenderness to palpation given her leukocytosis recent prednisone use and methotrexate will order CT imaging of her abdomen to evaluate for acute abnormality.  EKG is nonischemic initial troponin is negative.  Have a lower suspicion for ACS.  Chest x-ray on my review does not show evidence of infiltrate or consolidation no pneumothorax.   Clinical Course as of 12/26/21 1501  Sun Dec 26, 2021  1422 On my review of CT patient does have diffuse diverticular disease and also any free air will await formal radiology report. [PR]  [id number] Patient reassessed.  Remains well-appearing no acute distress.  Suspected leukocytosis secondary to prednisone use currently.  Denies any chest pain or pressure.  She denies any dysuria.  She states she otherwise feels well.  Given reassuring work-up I do believe she is appropriate for outpatient follow-up at this point.  Patient family agree to plan. [PR]    Clinical Course User Index [PR] Merlyn Lot, MD     FINAL CLINICAL IMPRESSION(S) / ED DIAGNOSES   Final diagnoses:  Epigastric pain     Rx / DC Orders   ED Discharge Orders     None        Cole:  This document was prepared using Dragon voice recognition software and may include unintentional dictation errors.    Merlyn Lot, MD 12/26/21 1501

## 2021-12-26 NOTE — ED Notes (Signed)
Follow up pcp all info provided all questions answered  °

## 2021-12-26 NOTE — Discharge Instructions (Addendum)

## 2021-12-26 NOTE — ED Triage Notes (Signed)
Pt in via EMS from home with c/o  Pt was getting ready for church and started with chest tightness, SOB, dizziness and weakness. EMS repots pt in a-fib new onset upon their arrival. # 20g to left hand, pt was given 4mg  of zofran en route to ED. FSBS 256, pt with hx of diabetes and took her metformin this am 143/55

## 2021-12-26 NOTE — ED Triage Notes (Signed)
Pt via EMS from home. Pt c/o sudden onset of dizziness, chest pain, SOB, and nausea. Pt does have a hx of a stent placed. EMS reports a fib on the monitor with no hx. Denies CP at this time but still SOB. Pt is A&Ox4 and NAD.

## 2021-12-29 ENCOUNTER — Ambulatory Visit (INDEPENDENT_AMBULATORY_CARE_PROVIDER_SITE_OTHER): Payer: 59 | Admitting: *Deleted

## 2021-12-29 ENCOUNTER — Other Ambulatory Visit: Payer: Self-pay

## 2021-12-29 ENCOUNTER — Ambulatory Visit: Payer: Medicare HMO

## 2021-12-29 DIAGNOSIS — E538 Deficiency of other specified B group vitamins: Secondary | ICD-10-CM

## 2021-12-29 MED ORDER — CYANOCOBALAMIN 1000 MCG/ML IJ SOLN
1000.0000 ug | Freq: Once | INTRAMUSCULAR | Status: AC
  Administered 2021-12-29: 1000 ug via INTRAMUSCULAR

## 2021-12-29 NOTE — Progress Notes (Signed)
Pt arrived for B12 injection, given in L deltoid. Pt tolerated injection well, showed no signs of distress nor voiced any concerns.  ?

## 2021-12-30 ENCOUNTER — Other Ambulatory Visit: Payer: Self-pay | Admitting: Internal Medicine

## 2021-12-30 ENCOUNTER — Encounter: Payer: Self-pay | Admitting: Internal Medicine

## 2022-01-14 ENCOUNTER — Ambulatory Visit (INDEPENDENT_AMBULATORY_CARE_PROVIDER_SITE_OTHER): Payer: Medicare Other | Admitting: *Deleted

## 2022-01-14 DIAGNOSIS — E1159 Type 2 diabetes mellitus with other circulatory complications: Secondary | ICD-10-CM

## 2022-01-14 DIAGNOSIS — J449 Chronic obstructive pulmonary disease, unspecified: Secondary | ICD-10-CM

## 2022-01-14 DIAGNOSIS — I5032 Chronic diastolic (congestive) heart failure: Secondary | ICD-10-CM

## 2022-01-14 NOTE — Patient Instructions (Addendum)
Visit Information ? ?Thank you for taking time to visit with me today. Please don't hesitate to contact me if I can be of assistance to you before our next scheduled telephone appointment. ? ?Following are the goals we discussed today:  ?Patient will self administer medications as prescribed ?Patient will attend all scheduled provider appointments ?Patient will continue to perform ADL's independently ?Develop a rescue plan & follow rescue plan if symptoms flare-up ?Eliminate symptom triggers at home ?Consider using nebulizer for increase in shortness of breath ?Contact provider for increase shortness of breath and increased need for rescue inhaler  ?Keep and attend follow-up appointments ?Weigh self daily and keep in log for provider review ?Notify if you gain 2-3 pounds overnight of 5 pounds in 5 days ?Develop a rescue plan & follow rescue plan if symptoms flare-up ?Know when to call the doctor ?Low salt and low carbohydrate diabetic diet ?Check blood sugars at least daily, keeping log for provider review ?Schedule and attend Rheumatology appointment ? ? ?Our next appointment is by telephone on 4/3 at 1315 ? ?Please call the care guide team at [phone] if you need to cancel or reschedule your appointment.  ? ?If you are experiencing a Mental Health or Behavioral Health Crisis or need someone to talk to, please call the Suicide and Crisis Lifeline: 988 ?call the [place] National Suicide Prevention Lifeline: 820-837-49831-442 053 2255 or TTY: 380-608-91971-800-799-4 TTY 305-042-5544(1-712 371 9431) to talk to a trained counselor ?call 1-800-273-TALK (toll free, 24 hour hotline) ?call 911  ? ?The patient verbalized understanding of instructions, educational materials, and care plan provided today and declined offer to receive copy of patient instructions, educational materials, and care plan.  ? ?Rhae LernerFarrah Clara Herbison RN, MSN ?RN Care Management Coordinator ?[facility] ?[phone] ?[email or web address] ? ?

## 2022-01-14 NOTE — Chronic Care Management (AMB) (Signed)
?Chronic Care Management  ? ?CCM RN Visit Note ? ?01/14/2022 ?Name: Tanya Cole MRN: [id number] DOB: 01-09-1933 ? ?Subjective: ?Tanya Cole is a 88 y.o. year old female who is a primary care patient of Dale Lawtey, MD. The care management team was consulted for assistance with disease management and care coordination needs.   ? ?Engaged with patient by telephone for follow up visit in response to provider referral for case management and/or care coordination services.  ? ?Consent to Services:  ?The patient was given information about Chronic Care Management services, agreed to services, and gave verbal consent prior to initiation of services.  Please see initial visit note for detailed documentation.  ? ?Patient agreed to services and verbal consent obtained.  ? ?Assessment: Review of patient past medical history, allergies, medications, health status, including review of consultants reports, laboratory and other test data, was performed as part of comprehensive evaluation and provision of chronic care management services.  ? ?SDOH (Social Determinants of Health) assessments and interventions performed:   ? ?CCM Care Plan ? ?Allergies  ?Allergen Reactions  ? Tramadol Itching and Nausea And Vomiting  ? ? ?Outpatient Encounter Medications as of 01/14/2022  ?Medication Sig  ? alendronate (FOSAMAX) 70 MG tablet Take 70 mg by mouth once a week.   ? aspirin 81 MG tablet Take 81 mg by mouth daily.  ? atorvastatin (LIPITOR) 80 MG tablet TAKE 1 TABLET BY MOUTH ONCE DAILY AT  6PM  ? fluticasone (FLONASE) 50 MCG/ACT nasal spray Use 2 spray(s) in each nostril once daily  ? fluticasone-salmeterol (ADVAIR) 250-50 MCG/ACT AEPB Inhale 1 puff into the lungs in the morning and at bedtime.  ? ipratropium-albuterol (DUONEB) 0.5-2.5 (3) MG/3ML SOLN Take 3 mLs by nebulization 2 (two) times daily.  ? meclizine (ANTIVERT) 25 MG tablet Take 1 tablet (25 mg total) by mouth 3 (three) times daily as needed for dizziness.  ?  metFORMIN (GLUCOPHAGE) 500 MG tablet TAKE 1 TABLET BY MOUTH TWICE DAILY WITH A MEAL  ? metoprolol tartrate (LOPRESSOR) 25 MG tablet Take 1 tablet by mouth twice daily  ? nitroGLYCERIN (NITROSTAT) 0.4 MG SL tablet Place 1 tablet (0.4 mg total) under the tongue every 5 (five) minutes as needed for chest pain.  ? ondansetron (ZOFRAN-ODT) 4 MG disintegrating tablet Take 1 tablet (4 mg total) by mouth every 8 (eight) hours as needed for nausea or vomiting.  ? predniSONE (DELTASONE) 5 MG tablet Take 3 tablets (15 mg total) by mouth daily with breakfast for 30 days, THEN 2.5 tablets (12.5 mg total) daily with breakfast for 30 days, THEN 2 tablets (10 mg total) daily with breakfast.  ? PROAIR HFA 108 (90 Base) MCG/ACT inhaler Inhale 2 puffs into the lungs every 4 (four) hours as needed for wheezing or shortness of breath.  ? Vitamin D, Ergocalciferol, (DRISDOL) 50000 units CAPS capsule Take 50,000 Units by mouth once a week.  ? ?No facility-administered encounter medications on file as of 01/14/2022.  ? ? ?Patient Active Problem List  ? Diagnosis Date Noted  ? Wrist pain 11/29/2021  ? Polyarthritis 10/31/2021  ? Limited mobility   ? Dizziness 10/26/2021  ? History of COVID-19 06/27/2021  ? COPD (chronic obstructive pulmonary disease) (HCC) 05/21/2021  ? Hypomagnesemia 05/14/2021  ? Hypokalemia   ? Asthma exacerbation   ? Joint ache 04/09/2021  ? Calcified granuloma of lung (HCC) 03/02/2021  ? Acute exacerbation of chronic obstructive pulmonary disease (COPD) (HCC) 03/01/2021  ? COPD exacerbation (HCC) 11/29/2020  ?  Chronic diastolic CHF (congestive heart failure) (HCC) 11/29/2020  ? CAP (community acquired pneumonia) 11/29/2020  ? Rib pain on right side 10/19/2020  ? Abnormal CXR 10/19/2020  ? Type 2 diabetes mellitus with cardiac complication (HCC) 10/08/2020  ? Elevated troponin 10/08/2020  ? Acute on chronic heart failure with preserved ejection fraction (HFpEF) (HCC) 10/08/2020  ? COPD with acute exacerbation (HCC)  08/19/2020  ? History of non-ST elevation myocardial infarction (NSTEMI) 08/07/2020  ? Asthma 08/07/2020  ? Leukocytosis 08/07/2020  ? Left shoulder pain 07/12/2020  ? Iron deficiency 04/16/2020  ? Anemia 04/04/2020  ? SOB (shortness of breath) 03/25/2020  ? Cough 09/30/2019  ? Gallstone pancreatitis   ? Pre-op evaluation 08/30/2019  ? Cholecystitis 08/05/2019  ? Choledocholithiasis   ? 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 Chest pain 10/07/2018  ? Memory change 05/27/2018  ? Osteoporosis 02/05/2018  ? Weight loss 06/24/2017  ? Abdominal pain, left lower quadrant 10/05/2016  ? Polymyalgia rheumatica syndrome (HCC) 05/16/2016  ? Long term current use of systemic steroids 05/16/2016  ? Elevated erythrocyte sedimentation rate 05/04/2016  ? Back pain 04/29/2016  ? Fatigue 05/24/2015  ? Health care maintenance 01/25/2015  ? UTI (urinary tract infection) 07/07/2014  ? Neuropathy 03/24/2014  ? Diverticulitis 02/24/2013  ? Reactive airway disease 09/15/2012  ? Hypertension 09/14/2012  ? Hypercholesterolemia 09/14/2012  ? Diabetes mellitus with cardiac complication (HCC) 09/14/2012  ? ? ?Conditions to be addressed/monitored:CHF, COPD, and DMII ? ?Care Plan : [facility] Care Plan  ?Updates made by Maple Mirza, RN since 01/14/2022 12:00 AM  ?  ? ?Problem: Patient with increase ER and hospitalizations related to shortness of breath COPD exacerbations   ?Priority: High  ?  ? ?Long-Range Goal: Patient will report no emergency room visits within the next 90 days   ?Start Date: 06/04/2021  ?Expected End Date: 12/28/2021  ?Recent Progress: Not on track  ?Priority: High  ?Note:   ?Current Barriers:  ?Knowledge Deficits related to plan of care for management of CHF, COPD, and DMII  ?Chronic Disease Management support and education needs related to CHF, COPD, and DMII; Patient reports recent ED visit for CHEST PAIN AND SOB.  Blood sugar FASTING was 122 with recent ranges of 120's.  Has not  used nebulizer in the last few weeks  Weight 118 pounds.  DENIES ANY CHEST PAINS OR SOB SINCE ER VISIT. ?RNCM Clinical Goal(s):  ?Patient will verbalize understanding of plan for management of CHF, COPD, and DMII ?verbalize basic understanding of CHF, COPD, and DMII disease process and self health management plan to manage conditions ?take all medications exactly as prescribed and will call provider for medication related questions ?demonstrate a decrease in CHF, COPD, and DMII exacerbations requiring emergency room visits or hospitalizations ?experience decrease in ED visits. ED visits in last 6 months = 7 (2 admissions) through collaboration with RN Care manager, provider, and care team.  ? ?Interventions: ?1:1 collaboration with primary care provider regarding development and update of comprehensive plan of care as evidenced by provider attestation and co-signature ?Inter-disciplinary care team collaboration (see longitudinal plan of care) ?Evaluation of current treatment plan related to  self management and patient's adherence to plan as established by provider; ?Discussed referral placed to Dr. Meredeth Ide for follow up appointment ? ?COPD: (Status: Goal on Track (progressing): YES.)  Long Term Goal ?Provided patient with basic written and verbal COPD education on self care/management/and exacerbation prevention; ?Advised patient to track and manage  COPD triggers;  ?Provided instruction about proper use of medications used for management of COPD including inhalers; ?Advised patient to self assesses COPD action plan zone and make appointment with provider if in the yellow zone for 48 hours without improvement; ?Discussed the importance of adequate rest and management of fatigue with COPD; ?Encouraged patient to use nebulizer if rescue inhaler not helping shortness of breath ?Discussed contacting providers for shortness of breath or increased need for rescue inhaler or nebulizer prior to needing to call  EMS ?Encouraged to increase activity as tolerated-  ?Patient reports that she and her daughter continue to walk every day, about 1/2 mile. ? ?Heart Failure Interventions:  (Status: Goal on track: NO.)  Long Term Goal ?[place]

## 2022-01-19 DIAGNOSIS — H25813 Combined forms of age-related cataract, bilateral: Secondary | ICD-10-CM | POA: Diagnosis not present

## 2022-01-19 DIAGNOSIS — E119 Type 2 diabetes mellitus without complications: Secondary | ICD-10-CM | POA: Diagnosis not present

## 2022-01-19 DIAGNOSIS — H353213 Exudative age-related macular degeneration, right eye, with inactive scar: Secondary | ICD-10-CM | POA: Diagnosis not present

## 2022-01-19 DIAGNOSIS — H353124 Nonexudative age-related macular degeneration, left eye, advanced atrophic with subfoveal involvement: Secondary | ICD-10-CM | POA: Diagnosis not present

## 2022-01-24 ENCOUNTER — Encounter: Payer: Self-pay | Admitting: Internal Medicine

## 2022-01-25 ENCOUNTER — Emergency Department: Payer: Medicare Other

## 2022-01-25 ENCOUNTER — Encounter: Payer: Self-pay | Admitting: Emergency Medicine

## 2022-01-25 ENCOUNTER — Observation Stay
Admission: EM | Admit: 2022-01-25 | Discharge: 2022-01-26 | Disposition: A | Discharge status: Home / Self Care | Payer: Medicare Other | Attending: Hospitalist | Admitting: Hospitalist

## 2022-01-25 ENCOUNTER — Other Ambulatory Visit: Payer: Self-pay

## 2022-01-25 DIAGNOSIS — R0602 Shortness of breath: Secondary | ICD-10-CM

## 2022-01-25 DIAGNOSIS — K449 Diaphragmatic hernia without obstruction or gangrene: Secondary | ICD-10-CM | POA: Diagnosis not present

## 2022-01-25 DIAGNOSIS — I5032 Chronic diastolic (congestive) heart failure: Secondary | ICD-10-CM | POA: Insufficient documentation

## 2022-01-25 DIAGNOSIS — I10 Essential (primary) hypertension: Secondary | ICD-10-CM | POA: Diagnosis not present

## 2022-01-25 DIAGNOSIS — J449 Chronic obstructive pulmonary disease, unspecified: Secondary | ICD-10-CM | POA: Insufficient documentation

## 2022-01-25 DIAGNOSIS — J45909 Unspecified asthma, uncomplicated: Secondary | ICD-10-CM | POA: Insufficient documentation

## 2022-01-25 DIAGNOSIS — I2510 Atherosclerotic heart disease of native coronary artery without angina pectoris: Secondary | ICD-10-CM | POA: Diagnosis present

## 2022-01-25 DIAGNOSIS — M353 Polymyalgia rheumatica: Secondary | ICD-10-CM | POA: Diagnosis present

## 2022-01-25 DIAGNOSIS — R6889 Other general symptoms and signs: Secondary | ICD-10-CM | POA: Diagnosis not present

## 2022-01-25 DIAGNOSIS — E7800 Pure hypercholesterolemia, unspecified: Secondary | ICD-10-CM

## 2022-01-25 DIAGNOSIS — R6520 Severe sepsis without septic shock: Secondary | ICD-10-CM | POA: Diagnosis not present

## 2022-01-25 DIAGNOSIS — I110 Hypertensive heart disease with heart failure: Secondary | ICD-10-CM | POA: Insufficient documentation

## 2022-01-25 DIAGNOSIS — J189 Pneumonia, unspecified organism: Secondary | ICD-10-CM | POA: Diagnosis not present

## 2022-01-25 DIAGNOSIS — R6883 Chills (without fever): Secondary | ICD-10-CM | POA: Diagnosis not present

## 2022-01-25 DIAGNOSIS — Z7984 Long term (current) use of oral hypoglycemic drugs: Secondary | ICD-10-CM | POA: Diagnosis not present

## 2022-01-25 DIAGNOSIS — Z7982 Long term (current) use of aspirin: Secondary | ICD-10-CM | POA: Diagnosis not present

## 2022-01-25 DIAGNOSIS — Z79899 Other long term (current) drug therapy: Secondary | ICD-10-CM | POA: Diagnosis not present

## 2022-01-25 DIAGNOSIS — R0789 Other chest pain: Secondary | ICD-10-CM | POA: Diagnosis present

## 2022-01-25 DIAGNOSIS — R52 Pain, unspecified: Secondary | ICD-10-CM | POA: Diagnosis not present

## 2022-01-25 DIAGNOSIS — E1159 Type 2 diabetes mellitus with other circulatory complications: Secondary | ICD-10-CM | POA: Diagnosis not present

## 2022-01-25 DIAGNOSIS — A419 Sepsis, unspecified organism: Secondary | ICD-10-CM | POA: Diagnosis present

## 2022-01-25 DIAGNOSIS — R0689 Other abnormalities of breathing: Secondary | ICD-10-CM | POA: Diagnosis not present

## 2022-01-25 DIAGNOSIS — R079 Chest pain, unspecified: Secondary | ICD-10-CM | POA: Diagnosis not present

## 2022-01-25 DIAGNOSIS — Z743 Need for continuous supervision: Secondary | ICD-10-CM | POA: Diagnosis not present

## 2022-01-25 DIAGNOSIS — J432 Centrilobular emphysema: Secondary | ICD-10-CM | POA: Diagnosis not present

## 2022-01-25 DIAGNOSIS — R739 Hyperglycemia, unspecified: Secondary | ICD-10-CM | POA: Diagnosis not present

## 2022-01-25 DIAGNOSIS — R778 Other specified abnormalities of plasma proteins: Secondary | ICD-10-CM | POA: Diagnosis not present

## 2022-01-25 LAB — CBC
HCT: 38.1 % (ref 36.0–46.0)
Hemoglobin: 12.5 g/dL (ref 12.0–15.0)
MCH: 29.2 pg (ref 26.0–34.0)
MCHC: 32.8 g/dL (ref 30.0–36.0)
MCV: 89 fL (ref 80.0–100.0)
Platelets: 252 10*3/uL (ref 150–400)
RBC: 4.28 MIL/uL (ref 3.87–5.11)
RDW: 16.7 % — ABNORMAL HIGH (ref 11.5–15.5)
WBC: 19.3 10*3/uL — ABNORMAL HIGH (ref 4.0–10.5)
nRBC: 0 % (ref 0.0–0.2)

## 2022-01-25 LAB — BASIC METABOLIC PANEL
Anion gap: 12 (ref 5–15)
BUN: 18 mg/dL (ref 8–23)
CO2: 24 mmol/L (ref 22–32)
Calcium: 9.1 mg/dL (ref 8.9–10.3)
Chloride: 101 mmol/L (ref 98–111)
Creatinine, Ser: 0.82 mg/dL (ref 0.44–1.00)
GFR, Estimated: >60 mL/min (ref >60)
Glucose, Bld: 289 mg/dL — ABNORMAL HIGH (ref 70–99)
Potassium: 4.1 mmol/L (ref 3.5–5.1)
Sodium: 137 mmol/L (ref 135–145)

## 2022-01-25 LAB — GLUCOSE, CAPILLARY: Glucose-Capillary: 196 mg/dL — ABNORMAL HIGH (ref 70–99)

## 2022-01-25 LAB — HEPATIC FUNCTION PANEL
ALT: 15 U/L (ref 0–44)
AST: 26 U/L (ref 15–41)
Albumin: 3.8 g/dL (ref 3.5–5.0)
Alkaline Phosphatase: 53 U/L (ref 38–126)
Bilirubin, Direct: 0.2 mg/dL (ref 0.0–0.2)
Indirect Bilirubin: 0.8 mg/dL (ref 0.3–0.9)
Total Bilirubin: 1 mg/dL (ref 0.3–1.2)
Total Protein: 6.4 g/dL — ABNORMAL LOW (ref 6.5–8.1)

## 2022-01-25 LAB — TROPONIN I (HIGH SENSITIVITY)
Troponin I (High Sensitivity): 17 ng/L (ref <18)
Troponin I (High Sensitivity): 18 ng/L — ABNORMAL HIGH (ref <18)
Troponin I (High Sensitivity): 20 ng/L — ABNORMAL HIGH (ref <18)
Troponin I (High Sensitivity): 20 ng/L — ABNORMAL HIGH (ref <18)

## 2022-01-25 LAB — HEMOGLOBIN A1C
Hgb A1c MFr Bld: 8 % — ABNORMAL HIGH (ref 4.8–5.6)
Mean Plasma Glucose: 182.9 mg/dL

## 2022-01-25 LAB — PROCALCITONIN: Procalcitonin: 5.89 ng/mL

## 2022-01-25 LAB — LACTIC ACID, PLASMA
Lactic Acid, Venous: 2.1 mmol/L (ref 0.5–1.9)
Lactic Acid, Venous: 1.7 mmol/L (ref 0.5–1.9)

## 2022-01-25 LAB — PROTIME-INR
INR: 1.1 (ref 0.8–1.2)
Prothrombin Time: 13.9 seconds (ref 11.4–15.2)

## 2022-01-25 LAB — LIPASE, BLOOD: Lipase: 27 U/L (ref 11–51)

## 2022-01-25 LAB — URINALYSIS, COMPLETE (UACMP) WITH MICROSCOPIC
Bacteria, UA: NONE SEEN
Bilirubin Urine: NEGATIVE
Glucose, UA: >=500 mg/dL — AB
Hgb urine dipstick: NEGATIVE
Ketones, ur: 5 mg/dL — AB
Leukocytes,Ua: NEGATIVE
Nitrite: NEGATIVE
Protein, ur: 30 mg/dL — AB
Specific Gravity, Urine: 1.032 — ABNORMAL HIGH (ref 1.005–1.030)
pH: 5 (ref 5.0–8.0)

## 2022-01-25 LAB — STREP PNEUMONIAE URINARY ANTIGEN: Strep Pneumo Urinary Antigen: NEGATIVE

## 2022-01-25 LAB — BRAIN NATRIURETIC PEPTIDE: B Natriuretic Peptide: 123.5 pg/mL — ABNORMAL HIGH (ref 0.0–100.0)

## 2022-01-25 MED ORDER — ATORVASTATIN CALCIUM 20 MG PO TABS
80.0000 mg | ORAL_TABLET | Freq: Every day | ORAL | Status: DC
  Administered 2022-01-25 – 2022-01-26 (×2): 80 mg via ORAL
  Filled 2022-01-25 (×2): qty 4

## 2022-01-25 MED ORDER — MORPHINE SULFATE (PF) 2 MG/ML IV SOLN
0.5000 mg | INTRAVENOUS | Status: DC | PRN
  Filled 2022-01-25: qty 1

## 2022-01-25 MED ORDER — MOMETASONE FURO-FORMOTEROL FUM 200-5 MCG/ACT IN AERO
2.0000 | INHALATION_SPRAY | Freq: Two times a day (BID) | RESPIRATORY_TRACT | Status: DC
  Administered 2022-01-25 – 2022-01-26 (×2): 2 via RESPIRATORY_TRACT
  Filled 2022-01-25: qty 8.8

## 2022-01-25 MED ORDER — OCUVITE-LUTEIN PO CAPS
1.0000 | ORAL_CAPSULE | Freq: Every day | ORAL | Status: DC
  Administered 2022-01-26: 1 via ORAL
  Filled 2022-01-25: qty 1

## 2022-01-25 MED ORDER — CEFTRIAXONE SODIUM 1 G IJ SOLR
1.0000 g | INTRAMUSCULAR | Status: DC
  Administered 2022-01-25: 1 g via INTRAVENOUS
  Filled 2022-01-25 (×2): qty 10

## 2022-01-25 MED ORDER — ASPIRIN 81 MG PO CHEW
324.0000 mg | CHEWABLE_TABLET | Freq: Once | ORAL | Status: AC
  Administered 2022-01-25: 324 mg via ORAL
  Filled 2022-01-25: qty 4

## 2022-01-25 MED ORDER — ONDANSETRON HCL 4 MG/2ML IJ SOLN
4.0000 mg | Freq: Three times a day (TID) | INTRAMUSCULAR | Status: DC | PRN
Start: 2022-01-25 — End: 2022-01-26

## 2022-01-25 MED ORDER — HYDRALAZINE HCL 20 MG/ML IJ SOLN: 5.0000 mg | INTRAMUSCULAR | Status: DC | PRN

## 2022-01-25 MED ORDER — SODIUM CHLORIDE 0.9 % IV BOLUS: 500.0000 mL | Freq: Once | INTRAVENOUS | Status: DC

## 2022-01-25 MED ORDER — NITROGLYCERIN 0.4 MG SL SUBL: 0.4000 mg | SUBLINGUAL_TABLET | SUBLINGUAL | Status: DC | PRN

## 2022-01-25 MED ORDER — ALBUTEROL SULFATE (2.5 MG/3ML) 0.083% IN NEBU
3.0000 mL | INHALATION_SOLUTION | RESPIRATORY_TRACT | Status: DC | PRN
Start: 2022-01-25 — End: 2022-01-26

## 2022-01-25 MED ORDER — ENOXAPARIN SODIUM 40 MG/0.4ML IJ SOSY
40.0000 mg | PREFILLED_SYRINGE | INTRAMUSCULAR | Status: DC
  Administered 2022-01-25: 40 mg via SUBCUTANEOUS
  Filled 2022-01-25: qty 0.4

## 2022-01-25 MED ORDER — ACETAMINOPHEN 325 MG PO TABS
650.0000 mg | ORAL_TABLET | Freq: Four times a day (QID) | ORAL | Status: DC | PRN
Start: 2022-01-25 — End: 2022-01-26
  Administered 2022-01-25 – 2022-01-26 (×2): 650 mg via ORAL
  Filled 2022-01-25 (×2): qty 2

## 2022-01-25 MED ORDER — SODIUM CHLORIDE 0.9 % IV SOLN: INTRAVENOUS | Status: DC

## 2022-01-25 MED ORDER — ASPIRIN EC 81 MG PO TBEC
81.0000 mg | DELAYED_RELEASE_TABLET | Freq: Every day | ORAL | Status: DC
  Administered 2022-01-26: 81 mg via ORAL
  Filled 2022-01-25: qty 1

## 2022-01-25 MED ORDER — IOHEXOL 350 MG/ML SOLN
75.0000 mL | Freq: Once | INTRAVENOUS | Status: AC | PRN
  Administered 2022-01-25: 75 mL via INTRAVENOUS

## 2022-01-25 MED ORDER — MECLIZINE HCL 25 MG PO TABS: 25.0000 mg | ORAL_TABLET | Freq: Three times a day (TID) | ORAL | Status: DC | PRN

## 2022-01-25 MED ORDER — METOPROLOL TARTRATE 25 MG PO TABS
25.0000 mg | ORAL_TABLET | Freq: Two times a day (BID) | ORAL | Status: DC
  Administered 2022-01-26: 25 mg via ORAL
  Filled 2022-01-25: qty 1

## 2022-01-25 MED ORDER — DM-GUAIFENESIN ER 30-600 MG PO TB12
1.0000 | ORAL_TABLET | Freq: Two times a day (BID) | ORAL | Status: DC | PRN
Start: 2022-01-25 — End: 2022-01-26

## 2022-01-25 MED ORDER — INSULIN ASPART 100 UNIT/ML IJ SOLN: 0.0000 [IU] | Freq: Every day | INTRAMUSCULAR | Status: DC

## 2022-01-25 MED ORDER — PREDNISONE 10 MG PO TABS
10.0000 mg | ORAL_TABLET | Freq: Every day | ORAL | Status: DC
  Administered 2022-01-26: 10 mg via ORAL
  Filled 2022-01-25: qty 1

## 2022-01-25 MED ORDER — METHOTREXATE 2.5 MG PO TABS
12.5000 mg | ORAL_TABLET | ORAL | Status: DC
  Administered 2022-01-26: 12.5 mg via ORAL
  Filled 2022-01-25: qty 5

## 2022-01-25 MED ORDER — ONDANSETRON 4 MG PO TBDP
4.0000 mg | ORAL_TABLET | Freq: Once | ORAL | Status: AC
  Administered 2022-01-25: 4 mg via ORAL
  Filled 2022-01-25: qty 1

## 2022-01-25 MED ORDER — SODIUM CHLORIDE 0.9 % IV BOLUS
1000.0000 mL | Freq: Once | INTRAVENOUS | Status: AC
  Administered 2022-01-25: 1000 mL via INTRAVENOUS

## 2022-01-25 MED ORDER — SODIUM CHLORIDE 0.9 % IV SOLN
500.0000 mg | INTRAVENOUS | Status: DC
  Administered 2022-01-25: 500 mg via INTRAVENOUS
  Filled 2022-01-25 (×2): qty 5

## 2022-01-25 MED ORDER — FOLIC ACID 1 MG PO TABS
1.0000 mg | ORAL_TABLET | Freq: Every day | ORAL | Status: DC
  Administered 2022-01-26: 1 mg via ORAL
  Filled 2022-01-25: qty 1

## 2022-01-25 MED ORDER — IPRATROPIUM-ALBUTEROL 0.5-2.5 (3) MG/3ML IN SOLN
3.0000 mL | Freq: Two times a day (BID) | RESPIRATORY_TRACT | Status: DC
  Administered 2022-01-25 – 2022-01-26 (×2): 3 mL via RESPIRATORY_TRACT
  Filled 2022-01-25 (×2): qty 3

## 2022-01-25 MED ORDER — SODIUM CHLORIDE 0.9 % IV BOLUS
500.0000 mL | Freq: Once | INTRAVENOUS | Status: AC
  Administered 2022-01-25: 500 mL via INTRAVENOUS

## 2022-01-25 MED ORDER — HYDROCORTISONE SOD SUC (PF) 100 MG IJ SOLR
50.0000 mg | Freq: Once | INTRAMUSCULAR | Status: AC
Start: 2022-01-25 — End: 2022-01-25
  Administered 2022-01-25: 50 mg via INTRAVENOUS
  Filled 2022-01-25: qty 1

## 2022-01-25 MED ORDER — INSULIN ASPART 100 UNIT/ML IJ SOLN
0.0000 [IU] | Freq: Three times a day (TID) | INTRAMUSCULAR | Status: DC
  Administered 2022-01-26: 1 [IU] via SUBCUTANEOUS
  Administered 2022-01-26: 2 [IU] via SUBCUTANEOUS
  Filled 2022-01-25 (×2): qty 1

## 2022-01-25 NOTE — Progress Notes (Addendum)
Dr. Blaine Hamper notified of critical lactic acid of 2.1 ? ?6:55 PM ?Orders entered and bolus started per order ?

## 2022-01-25 NOTE — ED Triage Notes (Signed)
Pt to ED via [facility] with c/o [facility] that began this am, she is feeling pressure in her epigastric area also. Denies any diaphoresis or nausea.  ?

## 2022-01-25 NOTE — ED Provider Notes (Signed)
? ?[facility] ?Provider Note ? ? ? Event Date/Time  ? First MD Initiated Contact with Patient 01/25/22 1226   ?  (approximate) ? ? ?History  ? ?Shortness of Breath ? ? ?HPI ? ?[name] Tanya Cole is a 88 y.o. female   known disease, heart failure with preserved ejection fraction, anemia, diabetes, hypertension who presents with shortness of breath and epigastric discomfort.  Patient notes that when she woke up today and felt short of breath and had discomfort in the center of her chest.  Described as a tight sensation.  Patient was also complaining of some back pain as well.  Also felt very cold.  Patient unsure if she had the symptoms before.  Denies nausea vomiting fevers chills or urinary symptoms.  Does feel fatigued.  Patient was seen in the ED last month for epigastric discomfort as well as shortness of breath had CT abdomen pelvis that was negative.  No history of DVT/PE.  Denies lower extremity edema. ? ?  ? ?Past Medical History:  ?Diagnosis Date  ? (HFpEF) heart failure with preserved ejection fraction (HCC)   ? a. TTE 12/19: EF 55-60%, probable HK of the mid apical anterior septal myocardium, Gr1DD, mild AI, mildly dilated LA; b.07/2020 Echo: EF 60-65%, no rwma, Gr2 DD. Nl RV fxn. Mildly dil LA. Mild MR.  ? Asthma   ? CAD (coronary artery disease)   ? a. 09/2018 NSTEMI/PCI: LM min irregs, mLAD 95 (PCI/DES), mLAD-2 60%, LCx mild diff dzs, RCA min irregs; b. 03/2020 MV: EF>65%, no ischemia/scar; c. 07/2020 Cath: LM min irregs, LAD 30p, 64m, 90d, D1/2 min irregs, LCX diff dzs throughout, OM1/2/3 mild dzs, RCA 30p, RPDA/RPAV min irrges. EF 55-65%.  ? CHF (congestive heart failure) (HCC)   ? Diabetes mellitus (HCC)   ? Hypercholesterolemia   ? Hypertension   ? Myocardial infarction [facility])   ? Osteopenia   ? Palpitations   ? a. 04/2020 Zio: Avg HR 75. 429 SVT episodes, longest 19 secs @ 133. Occas PACs (3.2%). Rare PVCs (<1%).  ? Polymyalgia rheumatica syndrome (HCC)   ? Reactive airway  disease   ? Severe sepsis (HCC)   ? ? ?Patient Active Problem List  ? Diagnosis Date Noted  ? CAD (coronary artery disease) 01/25/2022  ? Wrist pain 11/29/2021  ? Polyarthritis 10/31/2021  ? Limited mobility   ? Dizziness 10/26/2021  ? History of COVID-19 06/27/2021  ? COPD (chronic obstructive pulmonary disease) (HCC) 05/21/2021  ? Hypomagnesemia 05/14/2021  ? Hypokalemia   ? Asthma exacerbation   ? Joint ache 04/09/2021  ? Calcified granuloma of lung (HCC) 03/02/2021  ? Acute exacerbation of chronic obstructive pulmonary disease (COPD) (HCC) 03/01/2021  ? COPD exacerbation (HCC) 11/29/2020  ? Chronic diastolic CHF (congestive heart failure) (HCC) 11/29/2020  ? CAP (community acquired pneumonia) 11/29/2020  ? Rib pain on right side 10/19/2020  ? Abnormal CXR 10/19/2020  ? Type 2 diabetes mellitus with cardiac complication (HCC) 10/08/2020  ? Elevated troponin 10/08/2020  ? Acute on chronic heart failure with preserved ejection fraction (HFpEF) (HCC) 10/08/2020  ? COPD with acute exacerbation (HCC) 08/19/2020  ? History of non-ST elevation myocardial infarction (NSTEMI) 08/07/2020  ? Asthma 08/07/2020  ? Leukocytosis 08/07/2020  ? Left shoulder pain 07/12/2020  ? Iron deficiency 04/16/2020  ? Anemia 04/04/2020  ? SOB (shortness of breath) 03/25/2020  ? Cough 09/30/2019  ? Gallstone pancreatitis   ? Pre-op evaluation 08/30/2019  ? Cholecystitis 08/05/2019  ? Choledocholithiasis   ?  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 Chest pain 10/07/2018  ? Memory change 05/27/2018  ? Osteoporosis 02/05/2018  ? Weight loss 06/24/2017  ? Abdominal pain, left lower quadrant 10/05/2016  ? Polymyalgia rheumatica syndrome (HCC) 05/16/2016  ? Long term current use of systemic steroids 05/16/2016  ? Elevated erythrocyte sedimentation rate 05/04/2016  ? Back pain 04/29/2016  ? Fatigue 05/24/2015  ? Health care maintenance 01/25/2015  ? UTI (urinary tract infection) 07/07/2014  ? Neuropathy  03/24/2014  ? Diverticulitis 02/24/2013  ? Reactive airway disease 09/15/2012  ? Hypertension 09/14/2012  ? Hypercholesterolemia 09/14/2012  ? Diabetes mellitus with cardiac complication (HCC) 09/14/2012  ? ? ? ?Physical Exam  ?Triage Vital Signs: ?ED Triage Vitals  ?Enc Vitals Group  ?   BP 01/25/22 1122 (!) 150/64  ?   Pulse Rate 01/25/22 1122 95  ?   Resp 01/25/22 1122 18  ?   Temp 01/25/22 1122 98.2 ?F (36.8 ?C)  ?   Temp Source 01/25/22 1122 Oral  ?   SpO2 01/25/22 1122 91 %  ?   Weight 01/25/22 1123 119 lb (54 kg)  ?   Height 01/25/22 1123  (1.549 m)  ?   Head Circumference --   ?   Peak Flow --   ?   Pain Score 01/25/22 1123 6  ?   Pain Loc --   ?   Pain Edu? --   ?   Excl. in GC? --   ? ? ?Most recent vital signs: ?Vitals:  ? 01/25/22 1400 01/25/22 1556  ?BP: (!) 124/42 117/79  ?Pulse: 84 83  ?Resp: (!) 25 17  ?Temp:  99.4 ?F (37.4 ?C)  ?SpO2: 94% 93%  ? ? ? ?General: Awake, no distress.  ?CV:  Good peripheral perfusion.  No swelling ?Resp:  Normal effort.  Clear ?Abd:  No distention.  Nontender to palpation throughout ?Neuro:             Awake, Alert, Oriented x 3  ?Other:   ? ? ?ED Results / Procedures / Treatments  ?Labs ?(all labs ordered are listed, but only abnormal results are displayed) ?Labs Reviewed  ?BASIC METABOLIC PANEL - Abnormal; Notable for the following components:  ?    Result Value  ? Glucose, Bld 289 (*)   ? All other components within normal limits  ?CBC - Abnormal; Notable for the following components:  ? WBC 19.3 (*)   ? RDW 16.7 (*)   ? All other components within normal limits  ?URINALYSIS, COMPLETE (UACMP) WITH MICROSCOPIC - Abnormal; Notable for the following components:  ? Color, Urine YELLOW (*)   ? APPearance HAZY (*)   ? Specific Gravity, Urine 1.032 (*)   ? Glucose, UA >=500 (*)   ? Ketones, ur 5 (*)   ? Protein, ur 30 (*)   ? All other components within normal limits  ?HEPATIC FUNCTION PANEL - Abnormal; Notable for the following components:  ? Total Protein 6.4 (*)   ?  All other components within normal limits  ?TROPONIN I (HIGH SENSITIVITY) - Abnormal; Notable for the following components:  ? Troponin I (High Sensitivity) 18 (*)   ? All other components within normal limits  ?TROPONIN I (HIGH SENSITIVITY) - Abnormal; Notable for the following components:  ? Troponin I (High Sensitivity) 20 (*)   ? All other components within normal limits  ?PROTIME-INR  ?LIPASE, BLOOD  ?BRAIN NATRIURETIC PEPTIDE  ?HEMOGLOBIN A1C  ?  LIPID PANEL  ?CBC  ?TROPONIN I (HIGH SENSITIVITY)  ? ? ? ?EKG ? ?Normal sinus rhythm, normal axis, diffuse T wave flattening ? ? ?RADIOLOGY ?EKG interpretation performed by myself: NSR, nml axis, nml intervals, no acute ischemic changes ? ? ? ?PROCEDURES: ? ?Critical Care performed: No ? ?.1-3 Lead EKG Interpretation ?Performed by: Georga HackingMcHugh, Peni Rupard Rose, MD ?Authorized by: Georga HackingMcHugh, Grady Mohabir Rose, MD  ? ?  Interpretation: normal   ?  ECG rate assessment: normal   ?  Ectopy: none   ?  Conduction: normal   ? ?The patient is on the cardiac monitor to evaluate for evidence of arrhythmia and/or significant heart rate changes. ? ? ?MEDICATIONS ORDERED IN ED: ?Medications  ?nitroGLYCERIN (NITROSTAT) SL tablet 0.4 mg (has no administration in time range)  ?morphine (PF) 2 MG/ML injection 0.5 mg (has no administration in time range)  ?albuterol (PROVENTIL) (2.5 MG/3ML) 0.083% nebulizer solution 3 mL (has no administration in time range)  ?dextromethorphan-guaiFENesin (MUCINEX DM) 30-600 MG per 12 hr tablet 1 tablet (has no administration in time range)  ?ondansetron (ZOFRAN) injection 4 mg (has no administration in time range)  ?acetaminophen (TYLENOL) tablet 650 mg (has no administration in time range)  ?hydrALAZINE (APRESOLINE) injection 5 mg (has no administration in time range)  ?insulin aspart (novoLOG) injection 0-9 Units (has no administration in time range)  ?insulin aspart (novoLOG) injection 0-5 Units (has no administration in time range)  ?enoxaparin (LOVENOX) injection 40 mg  (has no administration in time range)  ?ondansetron (ZOFRAN-ODT) disintegrating tablet 4 mg (4 mg Oral Given 01/25/22 1409)  ?iohexol (OMNIPAQUE) 350 MG/ML injection 75 mL (75 mLs Intravenous Contrast Give

## 2022-01-25 NOTE — ED Notes (Signed)
Pt c/o nausea. MD aware.

## 2022-01-25 NOTE — ED Triage Notes (Signed)
FIRST NURSE NOTE:  ?Pt via EMS from home. Pt c/o SOB and chills that started this AM. States that she also had some abd pain. Pt has a hx of COPD, DM, and cardiac stent. Per EMS, 12 lead unremarkable.  ? ?CBG 296  ?140/50  ?98% on RA  ?98.1  ?

## 2022-01-25 NOTE — H&P (Addendum)
?History and Physical  ? ? ?[place] ZOX:[id number] DOB: 1933-03-03 DOA: 01/25/2022 ? ?Referring MD/NP/PA:  ? ?PCP: Dale DurhamScott, Charlene, MD  ? ?Patient coming from:  The patient is coming from home.  At baseline, pt is independent for most of ADL.       ? ?Chief Complaint: chest pain and SOB ? ?HPI: Tanya Cole is a 88 y.o. female with medical history significant of CAD, dHCF, HTN, HLD, COPD, PMR, who presents with chest pain. ? ?Patient states that her symptoms started this morning, including chest pain or shortness of breath.  Her chest pain is located central and lower chest, dull, tightness feeling, radiating to back, mild to moderate.  She stated her chest pain has resolved currently.  Denies cough, fever or chills.  Associated with shortness of breath.  Patient has nausea, no vomiting, diarrhea or abdominal pain.  No symptoms of UTI. ? ?Data Reviewed and ED Course: pt was found to have troponin level 17, 18, 20, lipase 27, INR 1.1, lactic acid 2.1, negative urinalysis, GFR> 60, temperature normal, blood pressure 124/42, heart rate 95, RR 25, oxygen saturation 91-95% on room air.  Chest x-ray negative.  CT angiogram is negative for PE, does showed A-fib 58 rate lower lobe.  Patient is placed on telemetry bed observation.  Dr. Kirke CorinArida of cardiology is consulted ? ? ?EKG: I have personally reviewed.  Sinus rhythm, QTc 408, frequent PVC. ? ? ?Review of Systems:  ? ?General: no fevers, chills, no body weight gain,  has fatigue ?HEENT: no blurry vision, hearing changes or sore throat ?Respiratory: has dyspnea, no coughing, wheezing ?CV: has chest pain, no palpitations ?GI: has nausea, no vomiting, abdominal pain, diarrhea, constipation ?GU: no dysuria, burning on urination, increased urinary frequency, hematuria  ?Ext: no leg edema ?Neuro: no unilateral weakness, numbness, or tingling, no vision change or hearing loss ?Skin: no rash, no skin tear. ?MSK: No muscle spasm, no deformity, no limitation of range  of movement in spin ?Heme: No easy bruising.  ?Travel history: No recent long distant travel. ? ? ?Allergy:  ?Allergies  ?Allergen Reactions  ? Tramadol Itching and Nausea And Vomiting  ? ? ?Past Medical History:  ?Diagnosis Date  ? (HFpEF) heart failure with preserved ejection fraction (HCC)   ? a. TTE 12/19: EF 55-60%, probable HK of the mid apical anterior septal myocardium, Gr1DD, mild AI, mildly dilated LA; b.07/2020 Echo: EF 60-65%, no rwma, Gr2 DD. Nl RV fxn. Mildly dil LA. Mild MR.  ? Asthma   ? CAD (coronary artery disease)   ? a. 09/2018 NSTEMI/PCI: LM min irregs, mLAD 95 (PCI/DES), mLAD-2 60%, LCx mild diff dzs, RCA min irregs; b. 03/2020 MV: EF>65%, no ischemia/scar; c. 07/2020 Cath: LM min irregs, LAD 30p, 838m, 90d, D1/2 min irregs, LCX diff dzs throughout, OM1/2/3 mild dzs, RCA 30p, RPDA/RPAV min irrges. EF 55-65%.  ? CHF (congestive heart failure) (HCC)   ? Diabetes mellitus (HCC)   ? Hypercholesterolemia   ? Hypertension   ? Myocardial infarction [facility](HCC)   ? Osteopenia   ? Palpitations   ? a. 04/2020 Zio: Avg HR 75. 429 SVT episodes, longest 19 secs @ 133. Occas PACs (3.2%). Rare PVCs (<1%).  ? Polymyalgia rheumatica syndrome (HCC)   ? Reactive airway disease   ? Severe sepsis (HCC)   ? ? ?Past Surgical History:  ?Procedure Laterality Date  ? ABDOMINAL HYSTERECTOMY  1981  ? prolapse and bleeding, ovaries not removed  ? BREAST EXCISIONAL BIOPSY Right   ?  CHOLECYSTECTOMY N/A 09/02/2019  ? Procedure: LAPAROSCOPIC CHOLECYSTECTOMY WITH INTRAOPERATIVE CHOLANGIOGRAM;  Surgeon: Henrene Dodge, MD;  Location: [facility] ORS;  Service: General;  Laterality: N/A;  ? CORONARY STENT INTERVENTION N/A 10/08/2018  ? Procedure: CORONARY STENT INTERVENTION;  Surgeon: Iran Ouch, MD;  Location: [facility] INVASIVE CV LAB;  Service: Cardiovascular;  Laterality: N/A;  ? ENDOSCOPIC RETROGRADE CHOLANGIOPANCREATOGRAPHY (ERCP) WITH PROPOFOL N/A 08/08/2019  ? Procedure: ENDOSCOPIC RETROGRADE CHOLANGIOPANCREATOGRAPHY (ERCP) WITH PROPOFOL;   Surgeon: Midge Minium, MD;  Location: [facility] ENDOSCOPY;  Service: Endoscopy;  Laterality: N/A;  ? LEFT HEART CATH AND CORONARY ANGIOGRAPHY N/A 10/08/2018  ? Procedure: LEFT HEART CATH AND CORONARY ANGIOGRAPHY;  Surgeon: Iran Ouch, MD;  Location: [facility] INVASIVE CV LAB;  Service: Cardiovascular;  Laterality: N/A;  ? LEFT HEART CATH AND CORONARY ANGIOGRAPHY N/A 08/10/2020  ? Procedure: LEFT HEART CATH AND CORONARY ANGIOGRAPHY possible percutaneous intervention;  Surgeon: Iran Ouch, MD;  Location: [facility] INVASIVE CV LAB;  Service: Cardiovascular;  Laterality: N/A;  ? UMBILICAL HERNIA REPAIR  7/94  ? ? ?Social History:  reports that she has never smoked. She has never used smokeless tobacco. She reports that she does not drink alcohol and does not use drugs. ? ?Family History:  ?Family History  ?Problem Relation Age of Onset  ? Arthritis Mother   ? Heart disease Mother   ? Heart attack Father   ? Throat cancer Sister   ? Parkinson's disease Sister   ? COPD Brother   ?  ? ?Prior to Admission medications   ?Medication Sig Start Date End Date Taking? Authorizing Provider  ?alendronate (FOSAMAX) 70 MG tablet Take 70 mg by mouth once a week.  11/06/17   [provider]  ?aspirin 81 MG tablet Take 81 mg by mouth daily.    [provider]  ?atorvastatin (LIPITOR) 80 MG tablet TAKE 1 TABLET BY MOUTH ONCE DAILY AT  6PM 05/04/21   Alver Sorrow, NP  ?fluticasone (FLONASE) 50 MCG/ACT nasal spray Use 2 spray(s) in each nostril once daily 08/03/20   Dale De Borgia, MD  ?fluticasone-salmeterol (ADVAIR) 250-50 MCG/ACT AEPB Inhale 1 puff into the lungs in the morning and at bedtime. 06/09/21   Dale [place], MD  ?ipratropium-albuterol (DUONEB) 0.5-2.5 (3) MG/3ML SOLN Take 3 mLs by nebulization 2 (two) times daily. 05/17/21   Kathlen Mody, MD  ?meclizine (ANTIVERT) 25 MG tablet Take 1 tablet (25 mg total) by mouth 3 (three) times daily as needed for dizziness. 10/27/21   Don Perking, [place], MD  ?metFORMIN  (GLUCOPHAGE) 500 MG tablet TAKE 1 TABLET BY MOUTH TWICE DAILY WITH A MEAL 12/30/21   Dale [place], MD  ?metoprolol tartrate (LOPRESSOR) 25 MG tablet Take 1 tablet by mouth twice daily 05/11/21   Iran Ouch, MD  ?nitroGLYCERIN (NITROSTAT) 0.4 MG SL tablet Place 1 tablet (0.4 mg total) under the tongue every 5 (five) minutes as needed for chest pain. 10/09/18   Adrian Saran, MD  ?ondansetron (ZOFRAN-ODT) 4 MG disintegrating tablet Take 1 tablet (4 mg total) by mouth every 8 (eight) hours as needed for nausea or vomiting. 10/27/21   Chesley Noon, MD  ?predniSONE (DELTASONE) 5 MG tablet Take 3 tablets (15 mg total) by mouth daily with breakfast for 30 days, THEN 2.5 tablets (12.5 mg total) daily with breakfast for 30 days, THEN 2 tablets (10 mg total) daily with breakfast. 11/01/21 01/30/22  Rolly Salter, MD  ?[facility] [place] [facility] HFA 108 (90 Base) MCG/ACT inhaler Inhale 2 puffs into the lungs every 4 (four) hours  as needed for wheezing or shortness of breath. 08/20/20   Rolly Salter, MD  ?Vitamin D, Ergocalciferol, (DRISDOL) 50000 units CAPS capsule Take 50,000 Units by mouth once a week. 06/24/18   [provider]  ? ? ?Physical Exam: ?Vitals:  ? 01/25/22 1300 01/25/22 1400 01/25/22 1556 01/25/22 1803  ?BP: (!) 141/59 (!) 124/42 117/79 (!) 123/59  ?Pulse: 81 84 83 69  ?Resp: (!) 23 (!) 25 17 18   ?Temp:   99.4 ?F (37.4 ?C) 98.3 ?F (36.8 ?C)  ?TempSrc:   Oral   ?SpO2: 95% 94% 93% 96%  ?Weight:    57.5 kg  ?Height:    5\' 1"  (1.549 m)  ? ?General: Not in acute distress ?HEENT: ?      Eyes: PERRL, EOMI, no scleral icterus. ?      ENT: No discharge from the ears and nose, no pharynx injection, no tonsillar enlargement.  ?      Neck: No JVD, no bruit, no mass felt. ?Heme: No neck lymph node enlargement. ?Cardiac: S1/S2, RRR, No murmurs, No gallops or rubs. ?Respiratory: No rales, wheezing, rhonchi or rubs. ?GI: Soft, nondistended, nontender, no rebound pain, no organomegaly, BS present. ?GU: No hematuria ?Ext: No  pitting leg edema bilaterally. 1+DP/PT pulse bilaterally. ?Musculoskeletal: No joint deformities, No joint redness or warmth, no limitation of ROM in spin. ?Skin: No rashes.  ?Neuro: Alert, oriented X3, crani

## 2022-01-26 DIAGNOSIS — I2510 Atherosclerotic heart disease of native coronary artery without angina pectoris: Secondary | ICD-10-CM | POA: Diagnosis not present

## 2022-01-26 DIAGNOSIS — I5030 Unspecified diastolic (congestive) heart failure: Secondary | ICD-10-CM | POA: Diagnosis not present

## 2022-01-26 DIAGNOSIS — R778 Other specified abnormalities of plasma proteins: Secondary | ICD-10-CM | POA: Diagnosis not present

## 2022-01-26 DIAGNOSIS — I10 Essential (primary) hypertension: Secondary | ICD-10-CM | POA: Diagnosis not present

## 2022-01-26 DIAGNOSIS — J189 Pneumonia, unspecified organism: Secondary | ICD-10-CM | POA: Diagnosis not present

## 2022-01-26 DIAGNOSIS — R079 Chest pain, unspecified: Secondary | ICD-10-CM | POA: Diagnosis not present

## 2022-01-26 LAB — CBC
HCT: 30.1 % — ABNORMAL LOW (ref 36.0–46.0)
Hemoglobin: 9.8 g/dL — ABNORMAL LOW (ref 12.0–15.0)
MCH: 29.3 pg (ref 26.0–34.0)
MCHC: 32.6 g/dL (ref 30.0–36.0)
MCV: 90.1 fL (ref 80.0–100.0)
Platelets: 189 10*3/uL (ref 150–400)
RBC: 3.34 MIL/uL — ABNORMAL LOW (ref 3.87–5.11)
RDW: 17 % — ABNORMAL HIGH (ref 11.5–15.5)
WBC: 10.6 10*3/uL — ABNORMAL HIGH (ref 4.0–10.5)
nRBC: 0 % (ref 0.0–0.2)

## 2022-01-26 LAB — LIPID PANEL
Cholesterol: 112 mg/dL (ref 0–200)
HDL: 35 mg/dL — ABNORMAL LOW (ref >40)
LDL Cholesterol: 50 mg/dL (ref 0–99)
Total CHOL/HDL Ratio: 3.2 RATIO
Triglycerides: 137 mg/dL (ref <150)
VLDL: 27 mg/dL (ref 0–40)

## 2022-01-26 LAB — GLUCOSE, CAPILLARY
Glucose-Capillary: 151 mg/dL — ABNORMAL HIGH (ref 70–99)
Glucose-Capillary: 130 mg/dL — ABNORMAL HIGH (ref 70–99)

## 2022-01-26 MED ORDER — AZITHROMYCIN 500 MG PO TABS
500.0000 mg | ORAL_TABLET | Freq: Every day | ORAL | 0 refills | Status: AC
Start: 2022-01-27 — End: 2022-01-30

## 2022-01-26 MED ORDER — KETOROLAC TROMETHAMINE 15 MG/ML IJ SOLN
7.5000 mg | Freq: Once | INTRAMUSCULAR | Status: AC
  Administered 2022-01-26: 7.5 mg via INTRAVENOUS
  Filled 2022-01-26 (×2): qty 1

## 2022-01-26 MED ORDER — AZITHROMYCIN 250 MG PO TABS
500.0000 mg | ORAL_TABLET | Freq: Every day | ORAL | Status: DC
  Administered 2022-01-26: 500 mg via ORAL
  Filled 2022-01-26: qty 2

## 2022-01-26 NOTE — Discharge Summary (Signed)
? ?Physician Discharge Summary ? ? ?[place] T[place] Good[place]ae Cole  female DOB: [birth year]  ?NWG:[id number] ? ?PCP: Dale DurhamScott, Charlene, MD ? ?Admit date: 01/25/2022 ?Discharge date: 01/26/2022 ? ?Admitted From: home ?Disposition:  home ?CODE STATUS: DNR ? ?Discharge Instructions   ? ? Discharge instructions   Complete by: As directed ?  ? Cardiologist has seen you and your chest pain has resolved, so you are cleared to go home.  Please follow up with cardiology as outpatient. ? ?Please finish 3 more days of azithromycin starting 3/30 for presumed atypical pneumonia. ? ? ?Dr. Darlin Priestlyina Stefany Starace ?- ?-  ? ?  ? ? ? ?Hospital Course:  ?For full details, please see H&P, progress notes, consult notes and ancillary notes.  ?Briefly,  ?Tanya Cole is a 88 y.o. female with medical history significant of CAD, dHCF, HTN, COPD, PMR, who presented with chest pain. ? ?Chest pain, ACS ruled out ?hx of CAD  ?--trop is minimally elevated 17, 18, 28.   ?--cardiology consulted and did not rec further cardiac workup.   ?--cont ASA and statin ? ?Hypertension ?-cont home Metoprolol ?  ?Hypercholesterolemia ?-Lipitor ?  ?Polymyalgia rheumatica syndrome [facility](HCC): Patient has been on prednisone tapering currently taking 10 mg daily.  Pt was discharged to finish taper as directed by outpatient provider. ?  ?Type 2 diabetes mellitus with cardiac complication [facility](HCC):  ?Recent A1c 6.6, well controlled. ?--discharged back on home regimen. ?  ?Chronic diastolic CHF (congestive heart failure) (HCC):  ?2D echo on 11/01/2021 showed EF of 60 to 65%.  Patient does not have leg edema.  No pulm edema chest x-ray.  BNP 123, CHF seem to be compensated. ?  ?Possible CAP (community acquired pneumonia) and severe sepsis:  ?Patient meets critical for sepsis with WBC 19.3, heart rate 95, RR 25. Lactic acid level 2.1 ?--pt was started on Rocephin and azithromycin for CAP on admission, however, did not have much respiratory symptoms.  Pt was discharged on 3 more days of azithromycin for  presumed atypical PNA. ?  ?COPD (chronic obstructive pulmonary disease) (HCC):  ?no wheezing or rhonchi. ?--cont home bronchodilators  ? ? ?Discharge Diagnoses:  ?Principal Problem: ?  Chest pain ?Active Problems: ?  Hypertension ?  Hypercholesterolemia ?  Polymyalgia rheumatica syndrome (HCC) ?  Type 2 diabetes mellitus with cardiac complication (HCC) ?  Elevated troponin ?  Chronic diastolic CHF (congestive heart failure) (HCC) ?  CAP (community acquired pneumonia) ?  COPD (chronic obstructive pulmonary disease) (HCC) ?  CAD (coronary artery disease) ?  Severe sepsis (HCC) ? ? ? ? ?Discharge Instructions: ? ?Allergies as of 01/26/2022   ? ?   Reactions  ? Tramadol Itching, Nausea And Vomiting  ? ?  ? ?  ?Medication List  ?  ? ?TAKE these medications   ? ?alendronate 70 MG tablet ?Commonly known as: FOSAMAX ?Take 70 mg by mouth once a week. ?  ?aspirin 81 MG tablet ?Take 81 mg by mouth daily. ?  ?atorvastatin 80 MG tablet ?Commonly known as: LIPITOR ?TAKE 1 TABLET BY MOUTH ONCE DAILY AT  6PM ?  ?azithromycin 500 MG tablet ?Commonly known as: ZITHROMAX ?Take 1 tablet (500 mg total) by mouth daily for 3 days. ?Start taking on: January 27, 2022 ?  ?fluticasone 50 MCG/ACT nasal spray ?Commonly known as: FLONASE ?Use 2 spray(s) in each nostril once daily ?  ?fluticasone-salmeterol 250-50 MCG/ACT Aepb ?Commonly known as: ADVAIR ?Inhale 1 puff into the lungs in the morning and at bedtime. ?  ?folic acid  1 MG tablet ?Commonly known as: FOLVITE ?Take 1 mg by mouth daily. ?  ?ipratropium-albuterol 0.5-2.5 (3) MG/3ML Soln ?Commonly known as: DUONEB ?Take 3 mLs by nebulization 2 (two) times daily. ?  ?meclizine 25 MG tablet ?Commonly known as: ANTIVERT ?Take 1 tablet (25 mg total) by mouth 3 (three) times daily as needed for dizziness. ?  ?metFORMIN 500 MG tablet ?Commonly known as: GLUCOPHAGE ?TAKE 1 TABLET BY MOUTH TWICE DAILY WITH A MEAL ?  ?methotrexate 2.5 MG tablet ?Take 12.5 mg by mouth once a week. ?  ?metoprolol tartrate  25 MG tablet ?Commonly known as: LOPRESSOR ?Take 1 tablet by mouth twice daily ?  ?nitroGLYCERIN 0.4 MG SL tablet ?Commonly known as: NITROSTAT ?Place 1 tablet (0.4 mg total) under the tongue every 5 (five) minutes as needed for chest pain. ?  ?ondansetron 4 MG disintegrating tablet ?Commonly known as: ZOFRAN-ODT ?Take 1 tablet (4 mg total) by mouth every 8 (eight) hours as needed for nausea or vomiting. ?  ?predniSONE 5 MG tablet ?Commonly known as: DELTASONE ?Take 3 tablets (15 mg total) by mouth daily with breakfast for 30 days, THEN 2.5 tablets (12.5 mg total) daily with breakfast for 30 days, THEN 2 tablets (10 mg total) daily with breakfast. ?Start taking on: November 01, 2021 ?  ?PreserVision AREDS 2 Caps ?Take 1 capsule by mouth daily. ?  ?ProAir HFA 108 (90 Base) MCG/ACT inhaler ?Generic drug: albuterol ?Inhale 2 puffs into the lungs every 4 (four) hours as needed for wheezing or shortness of breath. ?  ?Vitamin D (Ergocalciferol) 1.25 MG (50000 UNIT) Caps capsule ?Commonly known as: DRISDOL ?Take 50,000 Units by mouth once a week. ?  ? ?  ? ? ? Follow-up Information   ? ? Einar Pheasant, MD Follow up in 1 week(s).   ?Specialty: Internal Medicine ?Contact information: ?[address] ?[address] ?[place] [address] ?[phone] ? ? ?  ?  ? ? Wellington Hampshire, MD .   ?Specialty: Cardiology ?Contact information: ?[address] ?[address] ?[name] [address] ?[phone] ? ? ?  ?  ? ?  ?  ? ?  ? ? ?Allergies  ?Allergen Reactions  ? Tramadol Itching and Nausea And Vomiting  ? ? ? ?The results of significant diagnostics from this hospitalization (including imaging, microbiology, ancillary and laboratory) are listed below for reference.  ? ?Consultations: ? ? ?Procedures/Studies: ?DG Chest 2 View ? ?Result Date: 01/25/2022 ?CLINICAL DATA:  Shortness of breath.  Chills. EXAM: CHEST - 2 VIEW COMPARISON:  12/26/2021 FINDINGS: The heart size and mediastinal contours are within normal limits.  Aortic atherosclerotic calcifications. Both lungs are clear. Calcified granuloma noted in the left lower lobe. Thoracic spondylosis. IMPRESSION: No active cardiopulmonary abnormalities. Aortic Atherosclerosis (ICD10-I70.0). Electronically Signed   By: Kerby Moors M.D.   On: 01/25/2022 12:34  ? ?CT Angio Chest PE W and/or Wo Contrast ? ?Result Date: 01/25/2022 ?CLINICAL DATA:  Shortness of breath with chills today. Pulmonary embolism suspected, high probability. EXAM: CT ANGIOGRAPHY CHEST WITH CONTRAST TECHNIQUE: Multidetector CT imaging of the chest was performed using the standard protocol during bolus administration of intravenous contrast. Multiplanar CT image reconstructions and MIPs were obtained to evaluate the vascular anatomy. RADIATION DOSE REDUCTION: This exam was performed according to the departmental dose-optimization program which includes automated exposure control, adjustment of the mA and/or kV according to patient size and/or use of iterative reconstruction technique. CONTRAST:  36mL OMNIPAQUE IOHEXOL 350 MG/ML SOLN COMPARISON:  Chest CTA 06/27/2021 FINDINGS: Cardiovascular:  The pulmonary arteries are well opacified with contrast to the level of the subsegmental branches. There is no evidence of acute pulmonary embolism. Diffuse atherosclerosis of the aorta, great vessels and coronary arteries without evidence of acute systemic arterial abnormality. The heart size is normal. There is no pericardial effusion. Mediastinum/Nodes: There are no enlarged mediastinal, hilar or axillary lymph nodes.Stable small hiatal hernia. The thyroid gland and trachea appear unremarkable. Lungs/Pleura: No pleural effusion or pneumothorax. Mild centrilobular emphysema and scattered central airway thickening. Calcified granulomas are present at both lung bases. The overall aeration of the right lower lobe has improved compared with the most recent prior study. There is increased patchy airspace disease in the lingula.  Additional areas of subsegmental atelectasis or scarring in both lungs are unchanged. No suspicious pulmonary nodules. Upper abdomen: The visualized upper abdomen appears stable without significant finding

## 2022-01-26 NOTE — Consult Note (Signed)
? ? ? ?Cardiology Consultation:  ? ?Patient ID: [facility]; [id number]; February 10, 1933  ? ?Admit date: 01/25/2022 ?Date of Consult: 01/26/2022 ? ?Primary Care Provider: Dale [place], MD ?Primary Cardiologist: Kirke Corin ?Primary Electrophysiologist:  None ? ? ?Patient Profile:  ? ?Tanya Cole is a 88 y.o. female with a hx of CAD as outlined below, atrial tachycardia, Covid in 12/2020, COPD in the setting of extensive second-hand smoke exposure and worked in a Medical laboratory scientific officer for 29 years, DM, acute blood loss anemia, HTN, HLD, RA, diverticulosis, and osteoporosis who is being seen today for the evaluation of chest pain at the request of Dr. Clyde Lundborg. ? ?History of Present Illness:  ? ?Ms. Chopra was admitted in 09/2018 with an NSTEMI. LHC showed 95% mid LAD stenosis along with 60% distal LAD stenosis. She underwent successful PCI/DES to the mid LAD. Echo at that time showed an EF of 55-60%, probably hypokinesis of the mid-apicalanteroseptal myocardium, Gr1DD, mild mitral regurgitation, and a mildly dilated left atrium. She was diagnosed with acute blood loss anemia in 2021, leading to the discontinuation of Plavix. Outpatient cardiac monitoring in 03/2020 showed NSR with an average heart rate of 75 bpm. There were 429 episodes of atrial tachycardia with the longest episode lasting 19 seconds with a rate of 133 bpm. Occasional PACs with a burden of 3.2% and rare PVCs with a burden of < 1%. She was readmitted in 07/2020 with a small NSTEMI. LHC showed significant 1-vessel CAD with patent proximal LAD stent with mild ISR, stable mid LAD stenosis at 60%, and significant distal LAD stenosis at 90% close to the apex. EF was normal with a mildly elevated LVEDP. The distal LAD was felt to be the culprit, but was not amenable to PCI given distal location. Echo at that time showed an EF of 60-65%, no RWMA, Gr2DD, RVSF normal with normal ventricular cavity size, mildly dilated left atrium, and mild mitral regurgitation.  She was  admitted in 05/2021 with pleuritic chest pain in the setting of COVID infection.  Echo during the admission demonstrated an EF of 55 to 60% with mild to moderate mitral regurgitation.  Over the years, she has had several ED visits for varying complaints.  She was last seen in the office in 08/2021 and was without symptoms of angina or decompensation.  She was admitted to the hospital in early 10/2021 with possible polymyalgia rheumatica syndrome.  Echo during that admission demonstrated an EF of 60 to 65%, no regional wall motion abnormalities, mild LVH of the basal septal segment, indeterminate LV diastolic function parameters, normal RV systolic function and ventricular cavity size with mildly increased RV wall thickness, moderately elevated PASP, mildly dilated left atrium, mild mitral regurgitation, mild mitral stenosis, aortic valve sclerosis without evidence of stenosis, and an estimated right atrial pressure of 8 mmHg.  She was seen in the ED on 12/26/2021 with epigastric pain with normal high-sensitivity troponin.  CT abdomen showed no acute findings with severe diffuse colonic diverticulosis without evidence of diverticulitis as well as a small hiatal hernia. ? ?She was presented to [facility] on 01/25/2022 after developing SOB and chills on the morning of 3/28 without frank angina. She denies known fever or myalgias/arthralgias. No exertional symptoms, dizziness, presyncope, or syncope. Symptoms did not feel like her prior angina.  ? ?Upon her arrival to the ED vitals were stable.  Oxygen saturations 91 to 95% on room air.  Initial high-sensitivity opponent 17 with a delta troponin of 18, ultimately flat trending at 20.  BNP 123.  WBC 19.3 (mildly elevated white count throughout this year in the setting of steroids for PMR).  Lactic acid 2.1.  Chest x-ray showed no acute cardiopulmonary process.  CTA chest showed no evidence of acute PE or other vascular finding.  There was interval improvement in aeration in the  right lower lobe with mildly increased atelectasis or inflammation of the lingula as well as a small hiatal hernia, emphysema, and coronary artery calcification/aortic atherosclerosis.  In the ED, she received ASA 324, 1500 mL IV fluid, and has been placed on azithromycin and Rocephin.  She was admitted for severe sepsis with possible CAP.  Cardiology is asked to evaluate the patient's chest pain and elevated troponin. Currently feels back to baseline and is without complaint this morning.  ? ? ?Past Medical History:  ?Diagnosis Date  ? (HFpEF) heart failure with preserved ejection fraction (HCC)   ? a. TTE 12/19: EF 55-60%, probable HK of the mid apical anterior septal myocardium, Gr1DD, mild AI, mildly dilated LA; b.07/2020 Echo: EF 60-65%, no rwma, Gr2 DD. Nl RV fxn. Mildly dil LA. Mild MR.  ? Asthma   ? CAD (coronary artery disease)   ? a. 09/2018 NSTEMI/PCI: LM min irregs, mLAD 95 (PCI/DES), mLAD-2 60%, LCx mild diff dzs, RCA min irregs; b. 03/2020 MV: EF>65%, no ischemia/scar; c. 07/2020 Cath: LM min irregs, LAD 30p, 31m, 90d, D1/2 min irregs, LCX diff dzs throughout, OM1/2/3 mild dzs, RCA 30p, RPDA/RPAV min irrges. EF 55-65%.  ? CHF (congestive heart failure) (HCC)   ? Diabetes mellitus (HCC)   ? Hypercholesterolemia   ? Hypertension   ? Myocardial infarction [facility])   ? Osteopenia   ? Palpitations   ? a. 04/2020 Zio: Avg HR 75. 429 SVT episodes, longest 19 secs @ 133. Occas PACs (3.2%). Rare PVCs (<1%).  ? Polymyalgia rheumatica syndrome (HCC)   ? Reactive airway disease   ? Severe sepsis (HCC)   ? ? ?Past Surgical History:  ?Procedure Laterality Date  ? ABDOMINAL HYSTERECTOMY  1981  ? prolapse and bleeding, ovaries not removed  ? BREAST EXCISIONAL BIOPSY Right   ? CHOLECYSTECTOMY N/A 09/02/2019  ? Procedure: LAPAROSCOPIC CHOLECYSTECTOMY WITH INTRAOPERATIVE CHOLANGIOGRAM;  Surgeon: Henrene Dodge, MD;  Location: [facility] ORS;  Service: General;  Laterality: N/A;  ? CORONARY STENT INTERVENTION N/A 10/08/2018  ?  Procedure: CORONARY STENT INTERVENTION;  Surgeon: Iran Ouch, MD;  Location: [facility] INVASIVE CV LAB;  Service: Cardiovascular;  Laterality: N/A;  ? ENDOSCOPIC RETROGRADE CHOLANGIOPANCREATOGRAPHY (ERCP) WITH PROPOFOL N/A 08/08/2019  ? Procedure: ENDOSCOPIC RETROGRADE CHOLANGIOPANCREATOGRAPHY (ERCP) WITH PROPOFOL;  Surgeon: Midge Minium, MD;  Location: [facility] ENDOSCOPY;  Service: Endoscopy;  Laterality: N/A;  ? LEFT HEART CATH AND CORONARY ANGIOGRAPHY N/A 10/08/2018  ? Procedure: LEFT HEART CATH AND CORONARY ANGIOGRAPHY;  Surgeon: Iran Ouch, MD;  Location: [facility] INVASIVE CV LAB;  Service: Cardiovascular;  Laterality: N/A;  ? LEFT HEART CATH AND CORONARY ANGIOGRAPHY N/A 08/10/2020  ? Procedure: LEFT HEART CATH AND CORONARY ANGIOGRAPHY possible percutaneous intervention;  Surgeon: Iran Ouch, MD;  Location: [facility] INVASIVE CV LAB;  Service: Cardiovascular;  Laterality: N/A;  ? UMBILICAL HERNIA REPAIR  7/94  ?  ? ?Home Meds: ?Prior to Admission medications   ?Medication Sig Start Date End Date Taking? Authorizing Provider  ?aspirin 81 MG tablet Take 81 mg by mouth daily.   Yes [provider]  ?atorvastatin (LIPITOR) 80 MG tablet TAKE 1 TABLET BY MOUTH ONCE DAILY AT  6PM 05/04/21  Yes Gillian Shields  S, NP  ?fluticasone-salmeterol (ADVAIR) 250-50 MCG/ACT AEPB Inhale 1 puff into the lungs in the morning and at bedtime. 06/09/21  Yes Dale DurhamScott, Charlene, MD  ?folic acid (FOLVITE) 1 MG tablet Take 1 mg by mouth daily. 12/20/21  Yes [provider]  ?metFORMIN (GLUCOPHAGE) 500 MG tablet TAKE 1 TABLET BY MOUTH TWICE DAILY WITH A MEAL 12/30/21  Yes Dale DurhamScott, Charlene, MD  ?metoprolol tartrate (LOPRESSOR) 25 MG tablet Take 1 tablet by mouth twice daily 05/11/21  Yes Iran OuchArida, Muhammad A, MD  ?Multiple Vitamins-Minerals (PRESERVISION AREDS 2) CAPS Take 1 capsule by mouth daily.   Yes [provider]  ?predniSONE (DELTASONE) 5 MG tablet Take 3 tablets (15 mg total) by mouth daily with breakfast for 30 days,  THEN 2.5 tablets (12.5 mg total) daily with breakfast for 30 days, THEN 2 tablets (10 mg total) daily with breakfast. 11/01/21 01/30/22 Yes Rolly SalterPatel, Pranav M, MD  ?alendronate (FOSAMAX) 70 MG tablet Take 70 mg by mouth

## 2022-01-26 NOTE — Progress Notes (Signed)
?  Transition of Care (TOC) Screening Note ? ? ?Patient Details  ?Name: Tanya Cole ?Date of Birth: 23-Feb-1933 ? ? ?Transition of Care (TOC) CM/SW Contact:    ?Gildardo Griffes, LCSW ?Phone Number: ?01/26/2022, 8:59 AM ? ? ? ?Transition of Care Department [facility]) has reviewed patient and no TOC needs have been identified at this time. We will continue to monitor patient advancement through interdisciplinary progression rounds. If new patient transition needs arise, please place a TOC consult. ? Angeline Slim, [place] ?[phone] ? ?

## 2022-01-26 NOTE — Progress Notes (Signed)
SATURATION QUALIFICATIONS: (This note is used to comply with regulatory documentation for home oxygen)  Patient Saturations on Room Air at Rest = 99   Patient Saturations on Room Air while Ambulating = 97  Patient Saturations on 0Liters of oxygen while Ambulating = 97  Please briefly explain why patient needs home oxygen: 

## 2022-01-27 DIAGNOSIS — M810 Age-related osteoporosis without current pathological fracture: Secondary | ICD-10-CM | POA: Diagnosis not present

## 2022-01-27 DIAGNOSIS — E559 Vitamin D deficiency, unspecified: Secondary | ICD-10-CM | POA: Diagnosis not present

## 2022-01-27 LAB — LEGIONELLA PNEUMOPHILA SEROGP 1 UR AG: L. pneumophila Serogp 1 Ur Ag: NEGATIVE

## 2022-01-28 DIAGNOSIS — E1159 Type 2 diabetes mellitus with other circulatory complications: Secondary | ICD-10-CM

## 2022-01-28 DIAGNOSIS — J449 Chronic obstructive pulmonary disease, unspecified: Secondary | ICD-10-CM

## 2022-01-28 DIAGNOSIS — I5032 Chronic diastolic (congestive) heart failure: Secondary | ICD-10-CM

## 2022-01-29 ENCOUNTER — Encounter: Payer: Self-pay | Admitting: Internal Medicine

## 2022-01-30 LAB — CULTURE, BLOOD (ROUTINE X 2)
Culture: NO GROWTH
Culture: NO GROWTH
Special Requests: ADEQUATE
Special Requests: ADEQUATE

## 2022-01-31 ENCOUNTER — Encounter: Payer: Self-pay | Admitting: Internal Medicine

## 2022-01-31 ENCOUNTER — Ambulatory Visit (INDEPENDENT_AMBULATORY_CARE_PROVIDER_SITE_OTHER): Payer: Medicare HMO | Admitting: *Deleted

## 2022-01-31 ENCOUNTER — Ambulatory Visit (INDEPENDENT_AMBULATORY_CARE_PROVIDER_SITE_OTHER): Payer: Medicare HMO

## 2022-01-31 DIAGNOSIS — E1159 Type 2 diabetes mellitus with other circulatory complications: Secondary | ICD-10-CM

## 2022-01-31 DIAGNOSIS — E538 Deficiency of other specified B group vitamins: Secondary | ICD-10-CM | POA: Diagnosis not present

## 2022-01-31 DIAGNOSIS — I5032 Chronic diastolic (congestive) heart failure: Secondary | ICD-10-CM

## 2022-01-31 MED ORDER — CYANOCOBALAMIN 1000 MCG/ML IJ SOLN
1000.0000 ug | Freq: Once | INTRAMUSCULAR | Status: AC
  Administered 2022-01-31: 1000 ug via INTRAMUSCULAR

## 2022-01-31 NOTE — Chronic Care Management (AMB) (Signed)
?Chronic Care Management  ? ?CCM RN Visit Note ? ?01/31/2022 ?Name: [name] MRN: [id number] DOB: 12-29-1932 ? ?Subjective: ?Tanya Cole is a 88 y.o. year old female who is a primary care patient of Einar Pheasant, MD. The care management team was consulted for assistance with disease management and care coordination needs.   ? ?Engaged with patient by telephone for follow up visit in response to provider referral for case management and/or care coordination services.  ? ?Consent to Services:  ?The patient was given information about Chronic Care Management services, agreed to services, and gave verbal consent prior to initiation of services.  Please see initial visit note for detailed documentation.  ? ?Patient agreed to services and verbal consent obtained.  ? ?Assessment: Review of patient past medical history, allergies, medications, health status, including review of consultants reports, laboratory and other test data, was performed as part of comprehensive evaluation and provision of chronic care management services.  ? ?SDOH (Social Determinants of Health) assessments and interventions performed:   ? ?CCM Care Plan ? ?Allergies  ?Allergen Reactions  ? Tramadol Itching and Nausea And Vomiting  ? ? ?Outpatient Encounter Medications as of 01/31/2022  ?Medication Sig  ? alendronate (FOSAMAX) 70 MG tablet Take 70 mg by mouth once a week.   ? aspirin 81 MG tablet Take 81 mg by mouth daily.  ? atorvastatin (LIPITOR) 80 MG tablet TAKE 1 TABLET BY MOUTH ONCE DAILY AT  6PM  ? fluticasone (FLONASE) 50 MCG/ACT nasal spray Use 2 spray(s) in each nostril once daily  ? fluticasone-salmeterol (ADVAIR) 250-50 MCG/ACT AEPB Inhale 1 puff into the lungs in the morning and at bedtime.  ? folic acid (FOLVITE) 1 MG tablet Take 1 mg by mouth daily.  ? ipratropium-albuterol (DUONEB) 0.5-2.5 (3) MG/3ML SOLN Take 3 mLs by nebulization 2 (two) times daily.  ? meclizine (ANTIVERT) 25 MG tablet Take 1 tablet (25 mg total) by  mouth 3 (three) times daily as needed for dizziness.  ? metFORMIN (GLUCOPHAGE) 500 MG tablet TAKE 1 TABLET BY MOUTH TWICE DAILY WITH A MEAL  ? methotrexate 2.5 MG tablet Take 12.5 mg by mouth once a week.  ? metoprolol tartrate (LOPRESSOR) 25 MG tablet Take 1 tablet by mouth twice daily  ? Multiple Vitamins-Minerals (PRESERVISION AREDS 2) CAPS Take 1 capsule by mouth daily.  ? nitroGLYCERIN (NITROSTAT) 0.4 MG SL tablet Place 1 tablet (0.4 mg total) under the tongue every 5 (five) minutes as needed for chest pain.  ? ondansetron (ZOFRAN-ODT) 4 MG disintegrating tablet Take 1 tablet (4 mg total) by mouth every 8 (eight) hours as needed for nausea or vomiting.  ? PROAIR HFA 108 (90 Base) MCG/ACT inhaler Inhale 2 puffs into the lungs every 4 (four) hours as needed for wheezing or shortness of breath.  ? Vitamin D, Ergocalciferol, (DRISDOL) 50000 units CAPS capsule Take 50,000 Units by mouth once a week.  ? [EXPIRED] cyanocobalamin ((VITAMIN B-12)) injection 1,000 mcg   ? ?No facility-administered encounter medications on file as of 01/31/2022.  ? ? ?Patient Active Problem List  ? Diagnosis Date Noted  ? CAD (coronary artery disease) 01/25/2022  ? Severe sepsis ([place]) 01/25/2022  ? Wrist pain 11/29/2021  ? Polyarthritis 10/31/2021  ? Limited mobility   ? Dizziness 10/26/2021  ? History of COVID-19 06/27/2021  ? COPD (chronic obstructive pulmonary disease) ([place]) 05/21/2021  ? Hypomagnesemia 05/14/2021  ? Hypokalemia   ? Asthma exacerbation   ? Joint ache 04/09/2021  ? Calcified granuloma of  lung ([place]) 03/02/2021  ? Acute exacerbation of chronic obstructive pulmonary disease (COPD) ([place]) 03/01/2021  ? COPD exacerbation ([place]) 11/29/2020  ? Chronic diastolic CHF (congestive heart failure) ([place]) 11/29/2020  ? CAP (community acquired pneumonia) 11/29/2020  ? Rib pain on right side 10/19/2020  ? Abnormal CXR 10/19/2020  ? Type 2 diabetes mellitus with cardiac complication ([place]) [id number]  ? Elevated troponin 10/08/2020  ? Acute  on chronic heart failure with preserved ejection fraction (HFpEF) ([place]) 10/08/2020  ? COPD with acute exacerbation ([place]) 08/19/2020  ? History of non-ST elevation myocardial infarction (NSTEMI) 08/07/2020  ? Asthma 08/07/2020  ? Leukocytosis 08/07/2020  ? Left shoulder pain 07/12/2020  ? Iron deficiency 04/16/2020  ? Anemia 04/04/2020  ? SOB (shortness of breath) 03/25/2020  ? Cough 09/30/2019  ? Gallstone pancreatitis   ? Pre-op evaluation 08/30/2019  ? Cholecystitis 08/05/2019  ? Choledocholithiasis   ? Coronary artery disease of native heart with stable angina pectoris ([place]) 12/29/2018  ? Chest pain 10/07/2018  ? Memory change 05/27/2018  ? Osteoporosis 02/05/2018  ? Weight loss 06/24/2017  ? Abdominal pain, left lower quadrant 10/05/2016  ? Polymyalgia rheumatica syndrome ([place]) 05/16/2016  ? Long term current use of systemic steroids 05/16/2016  ? Elevated erythrocyte sedimentation rate 05/04/2016  ? Back pain 04/29/2016  ? Fatigue 05/24/2015  ? Health care maintenance 01/25/2015  ? UTI (urinary tract infection) 07/07/2014  ? Neuropathy 03/24/2014  ? Diverticulitis 02/24/2013  ? Reactive airway disease 09/15/2012  ? Hypertension 09/14/2012  ? Hypercholesterolemia 09/14/2012  ? Diabetes mellitus with cardiac complication ([place]) 99991111  ? ? ?Conditions to be addressed/monitored:CHF and DMII ? ?Care Plan : [facility] Care Plan  ?Updates made by Leona Singleton, RN since 01/31/2022 12:00 AM  ?  ? ?Problem: Patient with increase ER and hospitalizations related to shortness of breath COPD exacerbations   ?Priority: High  ?  ? ?Long-Range Goal: Patient will report no emergency room visits within the next 90 days   ?Start Date: 06/04/2021  ?Expected End Date: 12/28/2021  ?This Visit's Progress: Not on track  ?Recent Progress: Not on track  ?Priority: High  ?Note:   ?Current Barriers:  ?Knowledge Deficits related to plan of care for management of CHF, COPD, and DMII  ?Chronic Disease Management support and education needs  related to CHF, COPD, and DMII; Patient reports recent ED visit for CHEST PAIN AND SOB.  Blood sugar FASTING was 122 with recent ranges of 120's.  Has not used nebulizer in the last few weeks  Weight 118 pounds.  DENIES ANY CHEST PAINS OR SOB SINCE ER VISIT. ?4/3--Hospital overnight stay for chest heaviness and SOB (smothering); treated for possible pneumonia with antibiotics an prednisone taper.  States she feels better.  Reports she is taking increase taper of prednisone and antibiotics.  Denies any chest pains or increase shortness of breath.  Has not been weighing, states she needs batteries for her scale.  Checks blood sugars daily.  Despite increase in Hgb A1C patient reports fasting ranges of 120's.  Denies any hyperglycemia. ?RNCM Clinical Goal(s):  ?Patient will verbalize understanding of plan for management of CHF, COPD, and DMII ?verbalize basic understanding of CHF, COPD, and DMII disease process and self health management plan to manage conditions ?take all medications exactly as prescribed and will call provider for medication related questions ?demonstrate a decrease in CHF, COPD, and DMII exacerbations requiring emergency room visits or hospitalizations ?experience decrease in ED visits. ED visits in last 6 months = 5 (2  admissions) through collaboration with RN Care manager, provider, and care team.  ? ?Interventions: ?1:1 collaboration with primary care provider regarding development and update of comprehensive plan of care as evidenced by provider attestation and co-signature ?Inter-disciplinary care team collaboration (see longitudinal plan of care) ?Evaluation of current treatment plan related to  self management and patient's adherence to plan as established by provider; ?Discussed referral placed to Dr. Raul Del for follow up appointment ? ?COPD: (Status: Goal on track: NO.)  Long Term Goal ?Provided patient with basic written and verbal COPD education on self care/management/and exacerbation  prevention; ?Advised patient to track and manage COPD triggers;  ?Provided instruction about proper use of medications used for management of COPD including inhalers; ?Advised patient to self assesses COPD

## 2022-01-31 NOTE — Patient Instructions (Addendum)
Visit Information ? ?Thank you for taking time to visit with me today. Please don't hesitate to contact me if I can be of assistance to you before our next scheduled telephone appointment. ? ?Following are the goals we discussed today:  ?Patient will self administer medications as prescribed ?Patient will attend all scheduled provider appointments ?Patient will continue to perform ADL's independently ?Develop a rescue plan & follow rescue plan if symptoms flare-up ?Eliminate symptom triggers at home ?Consider using nebulizer for increase in shortness of breath ?Contact provider for increase shortness of breath and increased need for rescue inhaler  ?Keep and attend follow-up appointments ?Weigh self daily and keep in log for provider review ?Notify if you gain 2-3 pounds overnight of 5 pounds in 5 days ?Develop a rescue plan & follow rescue plan if symptoms flare-up ?Know when to call the doctor ?Low salt and low carbohydrate diabetic diet ?Check blood sugars at least daily, keeping log for provider review ?Schedule and attend cardiology hospital follow up appointment ? ?Our next appointment is by telephone on 4/24 at 1000 ? ?Please call the care guide team at [phone] if you need to cancel or reschedule your appointment.  ? ?If you are experiencing a Mental Health or Behavioral Health Crisis or need someone to talk to, please call the Suicide and Crisis Lifeline: 988 ?call the [place] National Suicide Prevention Lifeline: 702-236-5402 or TTY: 203 734 6237 TTY (828) 835-9134) to talk to a trained counselor ?call 1-800-273-TALK (toll free, 24 hour hotline) ?call 911  ? ?The patient verbalized understanding of instructions, educational materials, and care plan provided today and agreed to receive a mailed copy of patient instructions, educational materials, and care plan.  ? ?Rhae Lerner RN, MSN ?RN Care Management Coordinator ?[facility] ?[phone] ?[email or web address] ?   ?

## 2022-01-31 NOTE — Progress Notes (Signed)
Pt arrived for B12 injection, given in R deltoid. Pt tolerated injection well, showed no signs of distress nor voiced any concerns.  

## 2022-02-10 DIAGNOSIS — Z7960 Long term (current) use of unspecified immunomodulators and immunosuppressants: Secondary | ICD-10-CM | POA: Diagnosis not present

## 2022-02-10 DIAGNOSIS — M0579 Rheumatoid arthritis with rheumatoid factor of multiple sites without organ or systems involvement: Secondary | ICD-10-CM | POA: Diagnosis not present

## 2022-02-10 DIAGNOSIS — M1711 Unilateral primary osteoarthritis, right knee: Secondary | ICD-10-CM | POA: Diagnosis not present

## 2022-02-10 DIAGNOSIS — M353 Polymyalgia rheumatica: Secondary | ICD-10-CM | POA: Diagnosis not present

## 2022-02-18 ENCOUNTER — Ambulatory Visit (INDEPENDENT_AMBULATORY_CARE_PROVIDER_SITE_OTHER): Payer: Medicare HMO | Admitting: Internal Medicine

## 2022-02-18 ENCOUNTER — Encounter: Payer: Self-pay | Admitting: Internal Medicine

## 2022-02-18 VITALS — BP 138/80 | HR 80 | Temp 98.0°F | Ht 61.0 in | Wt 128.0 lb

## 2022-02-18 DIAGNOSIS — I5032 Chronic diastolic (congestive) heart failure: Secondary | ICD-10-CM | POA: Diagnosis not present

## 2022-02-18 DIAGNOSIS — E611 Iron deficiency: Secondary | ICD-10-CM

## 2022-02-18 DIAGNOSIS — J8410 Pulmonary fibrosis, unspecified: Secondary | ICD-10-CM | POA: Diagnosis not present

## 2022-02-18 DIAGNOSIS — I10 Essential (primary) hypertension: Secondary | ICD-10-CM

## 2022-02-18 DIAGNOSIS — R0789 Other chest pain: Secondary | ICD-10-CM | POA: Diagnosis not present

## 2022-02-18 DIAGNOSIS — E7800 Pure hypercholesterolemia, unspecified: Secondary | ICD-10-CM

## 2022-02-18 DIAGNOSIS — J449 Chronic obstructive pulmonary disease, unspecified: Secondary | ICD-10-CM | POA: Diagnosis not present

## 2022-02-18 DIAGNOSIS — M810 Age-related osteoporosis without current pathological fracture: Secondary | ICD-10-CM

## 2022-02-18 DIAGNOSIS — E1159 Type 2 diabetes mellitus with other circulatory complications: Secondary | ICD-10-CM

## 2022-02-18 DIAGNOSIS — D509 Iron deficiency anemia, unspecified: Secondary | ICD-10-CM

## 2022-02-18 DIAGNOSIS — I25118 Atherosclerotic heart disease of native coronary artery with other forms of angina pectoris: Secondary | ICD-10-CM | POA: Diagnosis not present

## 2022-02-18 DIAGNOSIS — M353 Polymyalgia rheumatica: Secondary | ICD-10-CM

## 2022-02-18 DIAGNOSIS — R413 Other amnesia: Secondary | ICD-10-CM

## 2022-02-18 NOTE — Progress Notes (Signed)
[name], female   DOB: 05-19-1933, 88 y.o.   MRN: [id number] ? ? ?Subjective:  ? ? [name], female    DOB: 1933/02/19, 88 y.o.   MRN: [id number] ? ?This visit occurred during the SARS-CoV-2 public health emergency.  Safety protocols were in place, including screening questions prior to the visit, additional usage of staff PPE, and extensive cleaning of exam room while observing appropriate contact time as indicated for disinfecting solutions.  ? ?Patient here for a scheduled follow up.  .  ? ?HPI ?Here to follow up regarding her blood pressure, blood sugar and cholesterol.  She is accompanied by her daughter-n-law.  History obtained from both of them.  Seeing Dr Allena Katz for f/u RA and PMR.  On prednisone and MTX.  Last evaluated 02/10/22 - prednisone decreased to 2.5mg  q day.  She is on alendronate.  Tolerating.  Will complete her 5 years in 02/2022.  Admitted 12/2021 with chest pain.  Cardiology evaluated and recommend continuing aspirin and statin.  No further cardiac testing.  Treated for possible CAP with abx.  Breathing has been stable.  Reports no increased sob.  States had a brief episode of chest heaviness on the ride over today, but may have only lasted 5 minutes.  Resolved on its own. She denies any chest pain or tightness with increased exertion and activity at home.  No increased cough or congestion.  No pain or heaviness now.  Eating. No nausea or vomiting.  No abdominal pain.  Bowels moving.  Overall she feels she has been doing relatively well.   ? ? ?Past Medical History:  ?Diagnosis Date  ? (HFpEF) heart failure with preserved ejection fraction (HCC)   ? a. TTE 12/19: EF 55-60%, probable HK of the mid apical anterior septal myocardium, Gr1DD, mild AI, mildly dilated LA; b.07/2020 Echo: EF 60-65%, no rwma, Gr2 DD. Nl RV fxn. Mildly dil LA. Mild MR.  ? Asthma   ? CAD (coronary artery disease)   ? a. 09/2018 NSTEMI/PCI: LM min irregs, mLAD 95 (PCI/DES), mLAD-2 60%,  LCx mild diff dzs, RCA min irregs; b. 03/2020 MV: EF>65%, no ischemia/scar; c. 07/2020 Cath: LM min irregs, LAD 30p, 41m, 90d, D1/2 min irregs, LCX diff dzs throughout, OM1/2/3 mild dzs, RCA 30p, RPDA/RPAV min irrges. EF 55-65%.  ? CHF (congestive heart failure) (HCC)   ? Diabetes mellitus (HCC)   ? Hypercholesterolemia   ? Hypertension   ? Myocardial infarction [facility])   ? Osteopenia   ? Palpitations   ? a. 04/2020 [facility]: Avg HR 75. 429 SVT episodes, longest 19 secs @ 133. Occas PACs (3.2%). Rare PVCs (<1%).  ? Polymyalgia rheumatica syndrome (HCC)   ? Reactive airway disease   ? Severe sepsis (HCC)   ? ?Past Surgical History:  ?Procedure Laterality Date  ? ABDOMINAL HYSTERECTOMY  1981  ? prolapse and bleeding, ovaries not removed  ? BREAST EXCISIONAL BIOPSY Right   ? CHOLECYSTECTOMY N/A 09/02/2019  ? Procedure: LAPAROSCOPIC CHOLECYSTECTOMY WITH INTRAOPERATIVE CHOLANGIOGRAM;  Surgeon: Henrene Dodge, MD;  Location: [facility] ORS;  Service: General;  Laterality: N/A;  ? CORONARY STENT INTERVENTION N/A 10/08/2018  ? Procedure: CORONARY STENT INTERVENTION;  Surgeon: Iran Ouch, MD;  Location: [facility] INVASIVE CV LAB;  Service: Cardiovascular;  Laterality: N/A;  ? ENDOSCOPIC RETROGRADE CHOLANGIOPANCREATOGRAPHY (ERCP) WITH PROPOFOL N/A 08/08/2019  ? Procedure: ENDOSCOPIC RETROGRADE CHOLANGIOPANCREATOGRAPHY (ERCP) WITH PROPOFOL;  Surgeon: Midge Minium, MD;  Location: [facility] ENDOSCOPY;  Service: Endoscopy;  Laterality: N/A;  ?  LEFT HEART CATH AND CORONARY ANGIOGRAPHY N/A 10/08/2018  ? Procedure: LEFT HEART CATH AND CORONARY ANGIOGRAPHY;  Surgeon: Iran OuchArida, Muhammad A, MD;  Location: [facility] INVASIVE CV LAB;  Service: Cardiovascular;  Laterality: N/A;  ? LEFT HEART CATH AND CORONARY ANGIOGRAPHY N/A 08/10/2020  ? Procedure: LEFT HEART CATH AND CORONARY ANGIOGRAPHY possible percutaneous intervention;  Surgeon: Iran OuchArida, Muhammad A, MD;  Location: [facility] INVASIVE CV LAB;  Service: Cardiovascular;  Laterality: N/A;  ? UMBILICAL HERNIA REPAIR  7/94   ? ?Family History  ?Problem Relation Age of Onset  ? Arthritis Mother   ? Heart disease Mother   ? Heart attack Father   ? Throat cancer Sister   ? Parkinson's disease Sister   ? COPD Brother   ? ?Social History  ? ?Socioeconomic History  ? Marital status: Widowed  ?  Spouse name: Not on file  ? Number of children: 3  ? Years of education: Not on file  ? Highest education level: Not on file  ?Occupational History  ? Not on file  ?Tobacco Use  ? Smoking status: Never  ? Smokeless tobacco: Never  ?Vaping Use  ? Vaping Use: Never used  ?Substance and Sexual Activity  ? Alcohol use: No  ?  Alcohol/week: 0.0 standard drinks  ? Drug use: No  ? Sexual activity: Not Currently  ?Other Topics Concern  ? Not on file  ?Social History Narrative  ? No smoking; no alcohol; in [place]; worked in Designer, fashion/clothingtextiles. Lives by self in [place]. Does all of her own housework. Dtr does food shopping for her.  ? ?Social Determinants of Health  ? ?Financial Resource Strain: Low Risk   ? Difficulty of Paying Living Expenses: Not hard at all  ?Food Insecurity: No Food Insecurity  ? Worried About Programme researcher, broadcasting/film/videounning Out of Food in the Last Year: Never true  ? Ran Out of Food in the Last Year: Never true  ?Transportation Needs: No Transportation Needs  ? Lack of Transportation (Medical): No  ? Lack of Transportation (Non-Medical): No  ?Physical Activity: Not on file  ?Stress: No Stress Concern Present  ? Feeling of Stress : Not at all  ?Social Connections: Moderately Integrated  ? Frequency of Communication with Friends and Family: More than three times a week  ? Frequency of Social Gatherings with Friends and Family: More than three times a week  ? Attends Religious Services: More than 4 times per year  ? Active Member of Clubs or Organizations: Yes  ? Attends BankerClub or Organization Meetings: Not on file  ? Marital Status: Widowed  ? ? ? ?Review of Systems  ?Constitutional:  Negative for appetite change and unexpected weight change.  ?HENT:  Negative for  congestion and sinus pressure.   ?Respiratory:  Negative for cough.   ?     Described the brief chest tightness as outlined.  Breathing stable.   ?Cardiovascular:  Negative for chest pain, palpitations and leg swelling.  ?Gastrointestinal:  Negative for abdominal pain, diarrhea, nausea and vomiting.  ?Genitourinary:  Negative for difficulty urinating and dysuria.  ?Musculoskeletal:  Negative for joint swelling and myalgias.  ?Skin:  Negative for color change and rash.  ?Neurological:  Negative for dizziness, light-headedness and headaches.  ?Psychiatric/Behavioral:  Negative for agitation and dysphoric mood.   ? ?   ?Objective:  ?  ? ?BP 138/80 (BP Location: Left Arm, Patient Position: Sitting, Cuff Size: Small)   Pulse 80   Temp 98 ?F (36.7 ?C) (Temporal)   Ht 5\' 1"  (1.549 m)  Wt 128 lb (58.1 kg)   SpO2 96%   BMI 24.19 kg/m?  ?Wt Readings from Last 3 Encounters:  ?02/18/22 128 lb (58.1 kg)  ?01/25/22 126 lb 11.2 oz (57.5 kg)  ?12/26/21 118 lb (53.5 kg)  ? ? ?Physical Exam ?Vitals reviewed.  ?Constitutional:   ?   General: She is not in acute distress. ?   Appearance: Normal appearance.  ?HENT:  ?   Head: Normocephalic and atraumatic.  ?   Right Ear: External ear normal.  ?   Left Ear: External ear normal.  ?Eyes:  ?   General: No scleral icterus.    ?   Right eye: No discharge.     ?   Left eye: No discharge.  ?   Conjunctiva/sclera: Conjunctivae normal.  ?Neck:  ?   Thyroid: No thyromegaly.  ?Cardiovascular:  ?   Rate and Rhythm: Normal rate and regular rhythm.  ?Pulmonary:  ?   Effort: No respiratory distress.  ?   Breath sounds: Normal breath sounds. No wheezing.  ?Abdominal:  ?   General: Bowel sounds are normal.  ?   Palpations: Abdomen is soft.  ?   Tenderness: There is no abdominal tenderness.  ?Musculoskeletal:     ?   General: No swelling or tenderness.  ?   Cervical back: Neck supple. No tenderness.  ?Lymphadenopathy:  ?   Cervical: No cervical adenopathy.  ?Skin: ?   Findings: No erythema or rash.   ?Neurological:  ?   Mental Status: She is alert.  ?Psychiatric:     ?   Mood and Affect: Mood normal.     ?   Behavior: Behavior normal.  ? ? ? ?Outpatient Encounter Medications as of 02/18/2022  ?Medication Sig

## 2022-02-19 LAB — BASIC METABOLIC PANEL WITH GFR
BUN: 22 mg/dL (ref 7–25)
CO2: 24 mmol/L (ref 20–32)
Calcium: 9.6 mg/dL (ref 8.6–10.4)
Chloride: 104 mmol/L (ref 98–110)
Creat: 0.89 mg/dL (ref 0.60–0.95)
Glucose, Bld: 133 mg/dL — ABNORMAL HIGH (ref 65–99)
Potassium: 4.5 mmol/L (ref 3.5–5.3)
Sodium: 142 mmol/L (ref 135–146)
eGFR: 63 mL/min/{1.73_m2} (ref >=60)

## 2022-02-19 LAB — CBC WITH DIFFERENTIAL/PLATELET
Absolute Monocytes: 525 cells/uL (ref 200–950)
Basophils Absolute: 42 cells/uL (ref 0–200)
Basophils Relative: 0.4 %
Eosinophils Absolute: 158 cells/uL (ref 15–500)
Eosinophils Relative: 1.5 %
HCT: 37.4 % (ref 35.0–45.0)
Hemoglobin: 12.5 g/dL (ref 11.7–15.5)
Lymphs Abs: 2184 cells/uL (ref 850–3900)
MCH: 30.3 pg (ref 27.0–33.0)
MCHC: 33.4 g/dL (ref 32.0–36.0)
MCV: 90.8 fL (ref 80.0–100.0)
MPV: 11.9 fL (ref 7.5–12.5)
Monocytes Relative: 5 %
Neutro Abs: 7592 cells/uL (ref 1500–7800)
Neutrophils Relative %: 72.3 %
Platelets: 294 10*3/uL (ref 140–400)
RBC: 4.12 10*6/uL (ref 3.80–5.10)
RDW: 14.7 % (ref 11.0–15.0)
Total Lymphocyte: 20.8 %
WBC: 10.5 10*3/uL (ref 3.8–10.8)

## 2022-02-19 LAB — FERRITIN: Ferritin: 35 ng/mL (ref 16–288)

## 2022-02-21 ENCOUNTER — Ambulatory Visit: Payer: Medicare HMO | Admitting: *Deleted

## 2022-02-21 DIAGNOSIS — I5032 Chronic diastolic (congestive) heart failure: Secondary | ICD-10-CM

## 2022-02-21 DIAGNOSIS — J449 Chronic obstructive pulmonary disease, unspecified: Secondary | ICD-10-CM

## 2022-02-21 DIAGNOSIS — E1159 Type 2 diabetes mellitus with other circulatory complications: Secondary | ICD-10-CM

## 2022-02-21 NOTE — Assessment & Plan Note (Signed)
On metformin.  a1c elevated.  Discussed low carb diet.  Has been on prednisone.  Dose - 2.80m now.  Follow met b and a1c.  ?

## 2022-02-21 NOTE — Assessment & Plan Note (Signed)
Has seen hematology.  Follow cbc and iron studies.  

## 2022-02-21 NOTE — Assessment & Plan Note (Signed)
Known CAD s/p PCI/DES - mid LAD.  Taking metoprolol.  Has NTG if needed.  Had the brief episode of chest tightness as outlined.  EKG - SR with no acute ischemic changes. Pt notified at appt.  No pain or tightness with her activity at home.  Hold on making medication changes.  Is followed by cardiology.  Continue risk factor modification. Discussed if change or worsening symptoms, needs to be evaluated.  ?

## 2022-02-21 NOTE — Assessment & Plan Note (Signed)
Known CAD s/p PCI/DES - mid LAD.  Taking metoprolol.  Has NTG if needed.  Had the brief episode of chest tightness as outlined.  EKG - SR with no acute ischemic changes. Pt notified at appt.  No pain or tightness with her activity at home.  Hold on making medication changes.  Is followed by cardiology.  Continue risk factor modification. Discussed if change or worsening symptoms, needs to be evaluated.  ?

## 2022-02-21 NOTE — Assessment & Plan Note (Deleted)
On evidence of volume overload on exam.  Breathing stable. Follow.  ?

## 2022-02-21 NOTE — Assessment & Plan Note (Signed)
Breathing stable.  Continue home inhalers.  °

## 2022-02-21 NOTE — Assessment & Plan Note (Signed)
Followed by pulmonary 

## 2022-02-21 NOTE — Assessment & Plan Note (Signed)
Persistent.  Discussed neurology evaluation.  Refer to neurology.  ?

## 2022-02-21 NOTE — Assessment & Plan Note (Signed)
Seeing rheumatology. On prednisone.  Dose recently decreased to 2.5mg  q day.  On MTX. Stable.  ?

## 2022-02-21 NOTE — Assessment & Plan Note (Signed)
Continue lipitor.  Low cholesterol diet and exercise.  Follow lipid panel and liver function tests.   

## 2022-02-21 NOTE — Assessment & Plan Note (Signed)
No evidence of volume overload on exam.  Breathing stable.  Follow.  

## 2022-02-21 NOTE — Assessment & Plan Note (Signed)
On alendronate.  Completes 5 years - 02/2022.  ?

## 2022-02-21 NOTE — Patient Instructions (Addendum)
Visit Information ? ?Thank you for taking time to visit with me today. Please don't hesitate to contact me if I can be of assistance to you before our next scheduled telephone appointment. ? ?Following are the goals we discussed today:  ?Patient will self administer medications as prescribed ?Patient will attend all scheduled provider appointments ?Patient will continue to perform ADL's independently ?Develop a rescue plan & follow rescue plan if symptoms flare-up ?Eliminate symptom triggers at home ?Consider using nebulizer for increase in shortness of breath ?Contact provider for increase shortness of breath and increased need for rescue inhaler  ?Keep and attend follow-up appointments ?Weigh self daily and keep in log for provider review ?Notify if you gain 2-3 pounds overnight of 5 pounds in 5 days ?Develop a rescue plan & follow rescue plan if symptoms flare-up ?Know when to call the doctor ?Low salt and low carbohydrate diabetic diet ?Check blood sugars at least daily, keeping log for provider review ? ?Our next appointment is by telephone on 5/26 at 1030 ? ?Please call the care guide team at [phone] if you need to cancel or reschedule your appointment.  ? ?If you are experiencing a Mental Health or Behavioral Health Crisis or need someone to talk to, please call the Suicide and Crisis Lifeline: 988 ?call the [place] National Suicide Prevention Lifeline: 2483210431 or TTY: (682)543-6775 TTY (231)582-5046) to talk to a trained counselor ?call 1-800-273-TALK (toll free, 24 hour hotline) ?call 911  ? ?The patient verbalized understanding of instructions, educational materials, and care plan provided today and agreed to receive a mailed copy of patient instructions, educational materials, and care plan.  ? ?Signature  ?

## 2022-02-21 NOTE — Assessment & Plan Note (Signed)
Continue lopressor.  Follow pressure.  Follow metabolic panel.  °

## 2022-02-22 NOTE — Chronic Care Management (AMB) (Signed)
?Chronic Care Management  ? ?CCM RN Visit Note ? ?02/22/2022 ?Name: Tanya AskewVirginia Mae Cole MRN: [id number] DOB: 02-23-1933 ? ?Subjective: ?[name] is a 88 y.o. year old female who is a primary care patient of Dale DurhamScott, Charlene, MD. The care management team was consulted for assistance with disease management and care coordination needs.   ? ?Engaged with patient by telephone for follow up visit in response to provider referral for case management and/or care coordination services.  ? ?Consent to Services:  ?The patient was given information about Chronic Care Management services, agreed to services, and gave verbal consent prior to initiation of services.  Please see initial visit note for detailed documentation.  ? ?Patient agreed to services and verbal consent obtained.  ? ?Assessment: Review of patient past medical history, allergies, medications, health status, including review of consultants reports, laboratory and other test data, was performed as part of comprehensive evaluation and provision of chronic care management services.  ? ?SDOH (Social Determinants of Health) assessments and interventions performed:   ? ?CCM Care Plan ? ?Allergies  ?Allergen Reactions  ? Tramadol Itching and Nausea And Vomiting  ? ? ?Outpatient Encounter Medications as of 02/21/2022  ?Medication Sig  ? metFORMIN (GLUCOPHAGE) 500 MG tablet TAKE 1 TABLET BY MOUTH TWICE DAILY WITH A MEAL  ? alendronate (FOSAMAX) 70 MG tablet Take 70 mg by mouth once a week.   ? aspirin 81 MG tablet Take 81 mg by mouth daily.  ? atorvastatin (LIPITOR) 80 MG tablet TAKE 1 TABLET BY MOUTH ONCE DAILY AT  6PM  ? fluticasone (FLONASE) 50 MCG/ACT nasal spray Use 2 spray(s) in each nostril once daily  ? fluticasone-salmeterol (ADVAIR) 250-50 MCG/ACT AEPB Inhale 1 puff into the lungs in the morning and at bedtime.  ? folic acid (FOLVITE) 1 MG tablet Take 1 mg by mouth daily.  ? ipratropium-albuterol (DUONEB) 0.5-2.5 (3) MG/3ML SOLN Take 3 mLs by nebulization  2 (two) times daily.  ? meclizine (ANTIVERT) 25 MG tablet Take 1 tablet (25 mg total) by mouth 3 (three) times daily as needed for dizziness.  ? methotrexate 2.5 MG tablet Take 12.5 mg by mouth once a week.  ? metoprolol tartrate (LOPRESSOR) 25 MG tablet Take 1 tablet by mouth twice daily  ? Multiple Vitamins-Minerals (PRESERVISION AREDS 2) CAPS Take 1 capsule by mouth daily.  ? nitroGLYCERIN (NITROSTAT) 0.4 MG SL tablet Place 1 tablet (0.4 mg total) under the tongue every 5 (five) minutes as needed for chest pain.  ? ondansetron (ZOFRAN-ODT) 4 MG disintegrating tablet Take 1 tablet (4 mg total) by mouth every 8 (eight) hours as needed for nausea or vomiting.  ? PROAIR HFA 108 (90 Base) MCG/ACT inhaler Inhale 2 puffs into the lungs every 4 (four) hours as needed for wheezing or shortness of breath.  ? Vitamin D, Ergocalciferol, (DRISDOL) 50000 units CAPS capsule Take 50,000 Units by mouth once a week.  ? ?No facility-administered encounter medications on file as of 02/21/2022.  ? ? ?Patient Active Problem List  ? Diagnosis Date Noted  ? CAD (coronary artery disease) 01/25/2022  ? Wrist pain 11/29/2021  ? Polyarthritis 10/31/2021  ? Limited mobility   ? Dizziness 10/26/2021  ? History of COVID-19 06/27/2021  ? COPD (chronic obstructive pulmonary disease) (HCC) 05/21/2021  ? Hypomagnesemia 05/14/2021  ? Hypokalemia   ? Asthma exacerbation   ? Joint ache 04/09/2021  ? Calcified granuloma of lung (HCC) 03/02/2021  ? Acute exacerbation of chronic obstructive pulmonary disease (COPD) (HCC) 03/01/2021  ?  COPD exacerbation (HCC) 11/29/2020  ? Chronic diastolic CHF (congestive heart failure) (HCC) 11/29/2020  ? CAP (community acquired pneumonia) 11/29/2020  ? Rib pain on right side 10/19/2020  ? Abnormal CXR 10/19/2020  ? Type 2 diabetes mellitus with cardiac complication (HCC) 10/08/2020  ? Elevated troponin 10/08/2020  ? Acute on chronic heart failure with preserved ejection fraction (HFpEF) (HCC) 10/08/2020  ? COPD with  acute exacerbation (HCC) 08/19/2020  ? History of non-ST elevation myocardial infarction (NSTEMI) 08/07/2020  ? Asthma 08/07/2020  ? Leukocytosis 08/07/2020  ? Left shoulder pain 07/12/2020  ? Iron deficiency 04/16/2020  ? Anemia 04/04/2020  ? SOB (shortness of breath) 03/25/2020  ? Cough 09/30/2019  ? Gallstone pancreatitis   ? Pre-op evaluation 08/30/2019  ? Cholecystitis 08/05/2019  ? Choledocholithiasis   ? 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 Chest tightness 10/07/2018  ? Memory change 05/27/2018  ? Osteoporosis 02/05/2018  ? Weight loss 06/24/2017  ? Abdominal pain, left lower quadrant 10/05/2016  ? Polymyalgia rheumatica syndrome (HCC) 05/16/2016  ? Long term current use of systemic steroids 05/16/2016  ? Elevated erythrocyte sedimentation rate 05/04/2016  ? Back pain 04/29/2016  ? Fatigue 05/24/2015  ? Health care maintenance 01/25/2015  ? UTI (urinary tract infection) 07/07/2014  ? Neuropathy 03/24/2014  ? Diverticulitis 02/24/2013  ? Reactive airway disease 09/15/2012  ? Hypertension 09/14/2012  ? Hypercholesterolemia 09/14/2012  ? Diabetes mellitus with cardiac complication (HCC) 09/14/2012  ? ? ?Conditions to be addressed/monitored:HTN and DMII ? ?Care Plan : [facility] Care Plan  ?Updates made by Maple Mirza, RN since 02/22/2022 12:00 AM  ?  ? ?Problem: Patient with increase ER and hospitalizations related to shortness of breath COPD exacerbations   ?Priority: High  ?  ? ?Long-Range Goal: Patient will report no emergency room visits within the next 90 days   ?Start Date: 06/04/2021  ?Expected End Date: 12/28/2021  ?Recent Progress: Not on track  ?Priority: High  ?Note:   ?Current Barriers:  ?Knowledge Deficits related to plan of care for management of CHF, COPD, and DMII  ?Chronic Disease Management support and education needs related to CHF, COPD, and DMII; Patient reports recent ED visit for CHEST PAIN AND SOB.  Blood sugar FASTING was 122 with recent  ranges of 120's.  Has not used nebulizer in the last few weeks  Weight 118 pounds.  DENIES ANY CHEST PAINS OR SOB SINCE ER VISIT. ?4/3--Hospital overnight stay for chest heaviness and SOB (smothering); treated for possible pneumonia with antibiotics an prednisone taper.  States she feels better.  Reports she is taking increase taper of prednisone and antibiotics.  Denies any chest pains or increase shortness of breath.  Has not been weighing, states she needs batteries for her scale.  Checks blood sugars daily.  Despite increase in Hgb A1C patient reports fasting ranges of 120's.  Denies any hyperglycemia. ?4/24--reports doing good; denies any recent falls.  Denies any SOB or dizziness.  States she does not remember any chest pains since hospitalization.  Weight 128 pounds.  Per patient fasting blood sugar 120 with recent ranges 120's ?RNCM Clinical Goal(s):  ?Patient will verbalize understanding of plan for management of CHF, COPD, and DMII ?verbalize basic understanding of CHF, COPD, and DMII disease process and self health management plan to manage conditions ?take all medications exactly as prescribed and will call provider for medication related questions ?demonstrate a decrease in CHF, COPD, and DMII exacerbations requiring emergency room visits or  hospitalizations ?experience decrease in ED visits. ED visits in last 6 months = 5 (2 admissions) through collaboration with RN Care manager, provider, and care team.  ? ?Interventions: ?1:1 collaboration with primary care provider regarding development and update of comprehensive plan of care as evidenced by provider attestation and co-signature ?Inter-disciplinary care team collaboration (see longitudinal plan of care) ?Evaluation of current treatment plan related to  self management and patient's adherence to plan as established by provider; ?Discussed referral placed to Dr. Meredeth Ide for follow up appointment ? ?COPD: (Status: Goal on Track (progressing): YES.)   Long Term Goal ?Provided patient with basic written and verbal COPD education on self care/management/and exacerbation prevention; ?Advised patient to track and manage COPD triggers;  ?Provided instruction

## 2022-02-27 ENCOUNTER — Other Ambulatory Visit: Payer: Self-pay | Admitting: Cardiovascular Disease

## 2022-02-27 DIAGNOSIS — J449 Chronic obstructive pulmonary disease, unspecified: Secondary | ICD-10-CM | POA: Diagnosis not present

## 2022-02-27 DIAGNOSIS — I5032 Chronic diastolic (congestive) heart failure: Secondary | ICD-10-CM | POA: Diagnosis not present

## 2022-02-27 DIAGNOSIS — E1159 Type 2 diabetes mellitus with other circulatory complications: Secondary | ICD-10-CM

## 2022-02-28 ENCOUNTER — Encounter: Payer: Self-pay | Admitting: Internal Medicine

## 2022-03-03 ENCOUNTER — Ambulatory Visit (INDEPENDENT_AMBULATORY_CARE_PROVIDER_SITE_OTHER): Payer: Medicare Other

## 2022-03-03 DIAGNOSIS — E538 Deficiency of other specified B group vitamins: Secondary | ICD-10-CM

## 2022-03-03 MED ORDER — CYANOCOBALAMIN 1000 MCG/ML IJ SOLN
1000.0000 ug | Freq: Once | INTRAMUSCULAR | Status: AC
  Administered 2022-03-03: 1000 ug via INTRAMUSCULAR

## 2022-03-03 NOTE — Progress Notes (Signed)
Patient presented for B 12 injection to left deltoid, patient voiced no concerns nor showed any signs of distress during injection. 

## 2022-03-07 ENCOUNTER — Emergency Department: Payer: Medicare Other

## 2022-03-07 ENCOUNTER — Other Ambulatory Visit: Payer: Self-pay

## 2022-03-07 ENCOUNTER — Emergency Department
Admission: EM | Admit: 2022-03-07 | Discharge: 2022-03-07 | Disposition: A | Discharge status: Home / Self Care | Payer: Medicare Other | Attending: Emergency Medicine | Admitting: Emergency Medicine

## 2022-03-07 DIAGNOSIS — J4521 Mild intermittent asthma with (acute) exacerbation: Secondary | ICD-10-CM | POA: Insufficient documentation

## 2022-03-07 DIAGNOSIS — Z7984 Long term (current) use of oral hypoglycemic drugs: Secondary | ICD-10-CM | POA: Diagnosis not present

## 2022-03-07 DIAGNOSIS — R0781 Pleurodynia: Secondary | ICD-10-CM | POA: Diagnosis present

## 2022-03-07 DIAGNOSIS — J441 Chronic obstructive pulmonary disease with (acute) exacerbation: Secondary | ICD-10-CM | POA: Diagnosis not present

## 2022-03-07 DIAGNOSIS — Z7982 Long term (current) use of aspirin: Secondary | ICD-10-CM | POA: Diagnosis not present

## 2022-03-07 DIAGNOSIS — Z20822 Contact with and (suspected) exposure to covid-19: Secondary | ICD-10-CM | POA: Diagnosis not present

## 2022-03-07 DIAGNOSIS — I509 Heart failure, unspecified: Secondary | ICD-10-CM | POA: Diagnosis not present

## 2022-03-07 DIAGNOSIS — Z79899 Other long term (current) drug therapy: Secondary | ICD-10-CM | POA: Diagnosis not present

## 2022-03-07 DIAGNOSIS — N390 Urinary tract infection, site not specified: Secondary | ICD-10-CM | POA: Insufficient documentation

## 2022-03-07 DIAGNOSIS — R0602 Shortness of breath: Secondary | ICD-10-CM | POA: Insufficient documentation

## 2022-03-07 DIAGNOSIS — Z7951 Long term (current) use of inhaled steroids: Secondary | ICD-10-CM | POA: Diagnosis not present

## 2022-03-07 DIAGNOSIS — I110 Hypertensive heart disease with heart failure: Secondary | ICD-10-CM | POA: Diagnosis not present

## 2022-03-07 DIAGNOSIS — E119 Type 2 diabetes mellitus without complications: Secondary | ICD-10-CM | POA: Diagnosis not present

## 2022-03-07 DIAGNOSIS — R079 Chest pain, unspecified: Secondary | ICD-10-CM

## 2022-03-07 LAB — CBC WITH DIFFERENTIAL/PLATELET
Abs Immature Granulocytes: 0.04 10*3/uL (ref 0.00–0.07)
Basophils Absolute: 0.1 10*3/uL (ref 0.0–0.1)
Basophils Relative: 0 %
Eosinophils Absolute: 0.2 10*3/uL (ref 0.0–0.5)
Eosinophils Relative: 2 %
HCT: 36.2 % (ref 36.0–46.0)
Hemoglobin: 12 g/dL (ref 12.0–15.0)
Immature Granulocytes: 0 %
Lymphocytes Relative: 16 %
Lymphs Abs: 2 10*3/uL (ref 0.7–4.0)
MCH: 30.3 pg (ref 26.0–34.0)
MCHC: 33.1 g/dL (ref 30.0–36.0)
MCV: 91.4 fL (ref 80.0–100.0)
Monocytes Absolute: 0.7 10*3/uL (ref 0.1–1.0)
Monocytes Relative: 5 %
Neutro Abs: 9.6 10*3/uL — ABNORMAL HIGH (ref 1.7–7.7)
Neutrophils Relative %: 77 %
Platelets: 303 10*3/uL (ref 150–400)
RBC: 3.96 MIL/uL (ref 3.87–5.11)
RDW: 14.8 % (ref 11.5–15.5)
WBC: 12.6 10*3/uL — ABNORMAL HIGH (ref 4.0–10.5)
nRBC: 0 % (ref 0.0–0.2)

## 2022-03-07 LAB — URINALYSIS, ROUTINE W REFLEX MICROSCOPIC
Bilirubin Urine: NEGATIVE
Glucose, UA: NEGATIVE mg/dL
Hgb urine dipstick: NEGATIVE
Ketones, ur: 5 mg/dL — AB
Nitrite: POSITIVE — AB
Protein, ur: 100 mg/dL — AB
Specific Gravity, Urine: 1.023 (ref 1.005–1.030)
pH: 7 (ref 5.0–8.0)

## 2022-03-07 LAB — D-DIMER, QUANTITATIVE: D-Dimer, Quant: 0.46 ug/mL-FEU (ref 0.00–0.50)

## 2022-03-07 LAB — COMPREHENSIVE METABOLIC PANEL
ALT: 19 U/L (ref 0–44)
AST: 22 U/L (ref 15–41)
Albumin: 4.1 g/dL (ref 3.5–5.0)
Alkaline Phosphatase: 57 U/L (ref 38–126)
Anion gap: 11 (ref 5–15)
BUN: 16 mg/dL (ref 8–23)
CO2: 25 mmol/L (ref 22–32)
Calcium: 8.8 mg/dL — ABNORMAL LOW (ref 8.9–10.3)
Chloride: 104 mmol/L (ref 98–111)
Creatinine, Ser: 0.69 mg/dL (ref 0.44–1.00)
GFR, Estimated: >60 mL/min (ref >60)
Glucose, Bld: 171 mg/dL — ABNORMAL HIGH (ref 70–99)
Potassium: 3.6 mmol/L (ref 3.5–5.1)
Sodium: 140 mmol/L (ref 135–145)
Total Bilirubin: 0.8 mg/dL (ref 0.3–1.2)
Total Protein: 6.8 g/dL (ref 6.5–8.1)

## 2022-03-07 LAB — TROPONIN I (HIGH SENSITIVITY)
Troponin I (High Sensitivity): 11 ng/L (ref <18)
Troponin I (High Sensitivity): 11 ng/L (ref <18)

## 2022-03-07 LAB — RESP PANEL BY RT-PCR (FLU A&B, COVID) ARPGX2
Influenza A by PCR: NEGATIVE
Influenza B by PCR: NEGATIVE
SARS Coronavirus 2 by RT PCR: NEGATIVE

## 2022-03-07 LAB — LACTIC ACID, PLASMA: Lactic Acid, Venous: 1.8 mmol/L (ref 0.5–1.9)

## 2022-03-07 MED ORDER — CEPHALEXIN 500 MG PO CAPS: 500.0000 mg | ORAL_CAPSULE | Freq: Three times a day (TID) | ORAL | 0 refills | Status: AC

## 2022-03-07 NOTE — ED Notes (Signed)
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 Pt denies using supplemental O2 at home.  ?

## 2022-03-07 NOTE — ED Notes (Signed)
Blood-work and swab collected during triage. Pt alert and laying calmly on stretcher; EDP Malinda to bedside.  ?

## 2022-03-07 NOTE — ED Notes (Addendum)
See triage note. Pt reports L sided CP; tightness/pressure; slightly worse with deep breathing per pt; denies N/V/D; currently pt's resp reg/unlabored; skin dry; NSR on monitor with frequent PACs and PVCs occasionally; HTN noted; visitor at bedside.  ?

## 2022-03-07 NOTE — ED Notes (Signed)
Pt assisted to bedside toilet and back to bed by Felicia NT; NT sent urine sample to lab.  ?

## 2022-03-07 NOTE — ED Triage Notes (Signed)
Pt comes into the ED via EMS from home with c/o left sided chest pain waking her up this morning around 7am with some SOB., pt is in NAD on arrival ? ?ASA 324mg  ?95%RA ?HR70 ?182/72 ?

## 2022-03-07 NOTE — ED Notes (Signed)
Pt confirms for EDP Malinda to send keflex to [facility] on [address]. Notified provider via secure chat.  ?

## 2022-03-07 NOTE — ED Notes (Signed)
Pt assisted up to bedside toilet. Pt urinate. Assisted into wheelchair.  ?

## 2022-03-07 NOTE — Discharge Instructions (Addendum)
Please follow-up with your regular doctor.  Please return for higher fever or more shortness of breath or worse chest pain.  Right now heart blood work and heart testing looks normal and the chest x-ray looks clear.  You do look like you have a little bit of a UTI.  I will give you some Keflex.  1 pill 3 times a day.  That should cover the UTI and if there is any mild lung infection that I am not seeing that should work on that as well.  Please follow-up with your regular doctor in the next couple days.  Again do not hesitate to return if you are worse. ?

## 2022-03-07 NOTE — ED Notes (Signed)
Pt and family member made aware that urine sample needed when possible. Pt agrees to notify this RN when able to provide sample.  ?

## 2022-03-07 NOTE — ED Provider Notes (Signed)
? ?[facility] ?Provider Note ? ? ? Event Date/Time  ? First MD Initiated Contact with Patient 03/07/22 1017   ?  (approximate) ? ? ?History  ? ?Chest Pain ? ? ?HPI ? ?Tanya Cole is a 88 y.o. female who woke up this morning with some shortness of breath in fact she had told her daughter that she was smothering.  She also has some left-sided chest pain which is slightly pleuritic.  It is also tight and achy.  Radiates up toward the shoulder.  She does not remember having this before.  She has a prior admission for CHF.  She is not coughing.  She did have a low-grade fever in the emergency department she did not realize she had any fever.  She thinks she is minimally short of breath now. ?Medications ?Reconcile with Patient's Chart ?Medications ?Medication Sig Dispensed Refills Start Date End Date Status  ?aspirin 81 MG EC tablet   Take 81 mg by mouth once daily.   0     Active  ?metFORMIN (GLUCOPHAGE) 500 MG tablet   Take 500 mg by mouth 2 (two) times daily with meals.   0     Active  ?fluticasone (FLONASE) 50 mcg/actuation nasal spray   once daily as needed USE TWO SPRAY IN EACH NOSTRIL EVERY DAY    0 08/22/2014   Active  ?cyanocobalamin (VITAMIN B12) 1,000 mcg/mL injection   Inject into the muscle monthly    0     Active  ?fluticasone propion-salmeterol (ADVAIR DISKUS) 250-50 mcg/dose diskus inhaler   ?Indications: Chronic obstructive pulmonary disease with acute exacerbation (CMS-HCC) Inhale 1 inhalation into the lungs every 12 (twelve) hours 1 Inhaler   5 10/05/2018   Active  ?atorvastatin (LIPITOR) 40 MG tablet   Take 40 mg by mouth once daily   0 10/18/2018   Active  ?metoprolol tartrate (LOPRESSOR) 25 MG tablet   Take 25 mg by mouth 2 (two) times daily    0 11/27/2018   Active  ?nitroGLYcerin (NITROSTAT) 0.4 MG SL tablet   Place under the tongue   0 10/09/2018   Active  ?albuterol sulfate (PROAIR HFA INHAL)   Inhale into the lungs as needed   0     Active  ?ipratropium-albuteroL  (DUO-NEB) nebulizer solution   Take 3 mLs by nebulization 4 (four) times daily   0     Active  ?ergocalciferol, vitamin D2, 1,250 mcg (50,000 unit) capsule   Take 1 capsule (50,000 Units total) by mouth once a week 12 capsule   3 06/19/2021   Active  ?vit C/E/Zn/coppr/lutein/zeaxan (PRESERVISION AREDS-2 ORAL)   Take by mouth   0     Active  ?methotrexate (RHEUMATREX) 2.5 MG tablet   ?Indications: Rheumatoid arthritis involving multiple sites with positive rheumatoid factor (CMS-HCC) Take 5 tablets (12.5 mg total) by mouth every 7 (seven) days All on the same day 20 tablet   2 [id number]   Active  ?folic acid (FOLVITE) 1 MG tablet   ?Indications: Rheumatoid arthritis involving multiple sites with positive rheumatoid factor (CMS-HCC) Take 1 tablet (1 mg total) by mouth once daily 90 tablet   3 12/20/2021 12/20/2022 Active  ?azithromycin (ZITHROMAX) 500 MG tablet   Take 500 mg by mouth once daily   0 01/26/2022   Active  ?ondansetron (ZOFRAN-ODT) 4 MG disintegrating tablet   Take 4 mg by mouth every 8 (eight) hours as needed   0 10/27/2021   Active  ?meclizine (ANTIVERT) 25 mg  tablet   Take 25 mg by mouth 3 (three) times daily as needed   0 10/27/2021   Active  ?predniSONE (DELTASONE) 2.5 MG tablet   ?Indications: Rheumatoid arthritis involving multiple sites with positive rheumatoid factor (CMS-HCC) Take 1 tablet (2.5 mg total) by mouth once daily 90 tablet   1 04    ? ? Active Problems ?Reconcile with Patient's Chart ?Active Problems ?Problem Noted Date  ?Rheumatoid arthritis involving multiple sites with positive rheumatoid factor 12/20/2021  ?Osteoporosis 06/14/2017  ?Primary osteoarthritis of right knee 06/14/2017  ?Polymyalgia rheumatica syndrome 05/16/2016  ?Bilateral hip pain 05/04/2016  ?Low back pain radiating to both legs 05/04/2016  ?Acute pain of both shoulders 05/04/2016  ?Mild intermittent asthma 09/17/2014  ?1 discharge summary also notes polymyalgia rheumatica. ?Same discharge summary also notes  CHF ? ? ?Physical Exam  ? ?Triage Vital Signs: ?ED Triage Vitals  ?Enc Vitals Group  ?   BP 03/07/22 0948 106/76  ?   Pulse Rate 03/07/22 0948 77  ?   Resp 03/07/22 0948 16  ?   Temp 03/07/22 0948 (!) 100.6 ?F (38.1 ?C)  ?   Temp Source 03/07/22 0948 Oral  ?   SpO2 03/07/22 0948 94 %  ?   Weight 03/07/22 0949 118 lb (53.5 kg)  ?   Height 03/07/22 0949 5\' 1"  (1.549 m)  ?   Head Circumference --   ?   Peak Flow --   ?   Pain Score 03/07/22 0949 6  ?   Pain Loc --   ?   Pain Edu? --   ?   Excl. in [place]? --   ? ? ?Most recent vital signs: ?Vitals:  ? 03/07/22 1200 03/07/22 1300  ?BP: (!) 141/59 (!) 134/58  ?Pulse: 71 67  ?Resp: (!) 24 (!) 21  ?Temp:    ?SpO2: 94% 96%  ? ? ? ?General: Awake, alert looks good no distress.  ?CV:  Good peripheral perfusion.  Heart regular rate and rhythm no audible murmur ?Resp:  Normal effort.  Lungs are clear ?Abd:  No distention.  Soft and nontender ?Extremities: No edema ? ? ?ED Results / Procedures / Treatments  ? ?Labs ?(all labs ordered are listed, but only abnormal results are displayed) ?Labs Reviewed  ?COMPREHENSIVE METABOLIC PANEL - Abnormal; Notable for the following components:  ?    Result Value  ? Glucose, Bld 171 (*)   ? Calcium 8.8 (*)   ? All other components within normal limits  ?CBC WITH DIFFERENTIAL/PLATELET - Abnormal; Notable for the following components:  ? WBC 12.6 (*)   ? Neutro Abs 9.6 (*)   ? All other components within normal limits  ?URINALYSIS, ROUTINE W REFLEX MICROSCOPIC - Abnormal; Notable for the following components:  ? Color, Urine YELLOW (*)   ? APPearance HAZY (*)   ? Ketones, ur 5 (*)   ? Protein, ur 100 (*)   ? Nitrite POSITIVE (*)   ? Leukocytes,Ua TRACE (*)   ? Bacteria, UA MANY (*)   ? All other components within normal limits  ?RESP PANEL BY RT-PCR (FLU A&B, COVID) [id number]  ?LACTIC ACID, PLASMA  ?D-DIMER, QUANTITATIVE  ?TROPONIN I (HIGH SENSITIVITY)  ?TROPONIN I (HIGH SENSITIVITY)  ? ? ? ?EKG ? ?EKG read and interpreted by me shows normal sinus  rhythm rate of 88 normal axis nonspecific ST-T wave changes ?EKG #2 read interpreted by me shows normal sinus rhythm rate of 78 normal axis very similar to EKG #1. ? ?  RADIOLOGY ?Chest x-ray evaluated by me shows a possible spot or nodule in the left lung.  I do not believe that that is old nipple shadow.  There was a smaller spot there earlier on an x-ray from a month and a half ago.  I will see what the radiologist has to say about this. ? ? ?PROCEDURES: ? ?Critical Care performed:  ? ?Procedures ? ? ?MEDICATIONS ORDERED IN ED: ?Medications - No data to display ? ? ?IMPRESSION / MDM / ASSESSMENT AND PLAN / ED COURSE  ?I reviewed the triage vital signs and the nursing notes. ?Patient with low-grade fever here but no cough.  Not really short of breath.  Chest pain seems to be minimal. ?----------------------------------------- ?1:16 PM on 03/07/2022 ?----------------------------------------- ? ?Patient still feels like she is smothering a little bit.  Her lungs are clear.  She is not coughing her O2 sats 97 has been stable repeat EKG looks just like the first 1 second troponin is okay.  Son thinks maybe she is having some anxiety and depression.  This is possible.  She does not look like she is having any lung problems.  She does have a low-grade fever but there is a slight UTI on the lab work.  I do not see anything going on with her heart with the second EKG the negative troponins etc.  Pain does not seem to be related to exertion at all.  Has not changed a whole time she has been here near as I can tell.  I will let her go with some Keflex.  That should cover UTI and many things that might cause any kind of COPD mild exacerbation etc.  Son will watch her carefully and bring her back if she is worse.  She will follow-up with her regular doctor. ? ? ?The patient is on the cardiac monitor to evaluate for evidence of arrhythmia and/or significant heart rate changes.  None have been seen ? ?  ? ? ?FINAL CLINICAL  IMPRESSION(S) / ED DIAGNOSES  ? ?Final diagnoses:  ?Nonspecific chest pain  ?Urinary tract infection without hematuria, site unspecified  ? ? ? ?Rx / DC Orders  ? ?ED Discharge Orders   ? ? None  ? ?  ? ? ? ?Note:  T

## 2022-03-18 ENCOUNTER — Encounter: Payer: Self-pay | Admitting: Internal Medicine

## 2022-03-25 ENCOUNTER — Ambulatory Visit (INDEPENDENT_AMBULATORY_CARE_PROVIDER_SITE_OTHER): Payer: 59 | Admitting: *Deleted

## 2022-03-25 DIAGNOSIS — J449 Chronic obstructive pulmonary disease, unspecified: Secondary | ICD-10-CM

## 2022-03-25 DIAGNOSIS — I5032 Chronic diastolic (congestive) heart failure: Secondary | ICD-10-CM

## 2022-03-25 NOTE — Patient Instructions (Addendum)
Visit Information  Thank you for taking time to visit with me today. Please don't hesitate to contact me if I can be of assistance to you before our next scheduled telephone appointment.  Following are the goals we discussed today:  Patient will self administer medications as prescribed Patient will attend all scheduled provider appointments Patient will continue to perform ADL's independently Develop a rescue plan & follow rescue plan if symptoms flare-up Eliminate symptom triggers at home Consider using nebulizer for increase in shortness of breath Contact provider for increase shortness of breath and increased need for rescue inhaler  Keep and attend follow-up appointments Weigh self daily and keep in log for provider review Notify if you gain 2-3 pounds overnight of 5 pounds in 5 days Develop a rescue plan & follow rescue plan if symptoms flare-up Know when to call the doctor Low salt and low carbohydrate diabetic diet Check blood sugars at least daily, keeping log for provider review Schedule and attend cardiology hospital follow up appointment  Our next appointment is by telephone on 7/5 at 1000  Please call the care guide team at [phone] if you need to cancel or reschedule your appointment.   If you are experiencing a Mental Health or Behavioral Health Crisis or need someone to talk to, please call the Suicide and Crisis Lifeline: 988 call the [place] National Suicide Prevention Lifeline: 432-823-17051-910-371-1659 or TTY: 931-868-69751-800-799-4 TTY 480-627-7815(1-367 732 3600) to talk to a trained counselor call 1-800-273-TALK (toll free, 24 hour hotline) call 911   The patient verbalized understanding of instructions, educational materials, and care plan provided today and DECLINED offer to receive copy of patient instructions, educational materials, and care plan.   Rhae LernerFarrah Charlayne Vultaggio RN, MSN RN Care Management Coordinator [facility] [phone] [email or web address]

## 2022-03-25 NOTE — Chronic Care Management (AMB) (Signed)
Chronic Care Management   CCM RN Visit Note  03/25/2022 Name: Tanya Cole MRN: [id number] DOB: 03-16-1933  Subjective: Tanya Cole is a 88 y.o. year old female who is a primary care patient of Dale Smithton, MD. The care management team was consulted for assistance with disease management and care coordination needs.    Engaged with patient by telephone for follow up visit in response to provider referral for case management and/or care coordination services.   Consent to Services:  The patient was given information about Chronic Care Management services, agreed to services, and gave verbal consent prior to initiation of services.  Please see initial visit note for detailed documentation.   Patient agreed to services and verbal consent obtained.   Assessment: Review of patient past medical history, allergies, medications, health status, including review of consultants reports, laboratory and other test data, was performed as part of comprehensive evaluation and provision of chronic care management services.   SDOH (Social Determinants of Health) assessments and interventions performed:    CCM Care Plan  Allergies  Allergen Reactions   Tramadol Itching and Nausea And Vomiting    Outpatient Encounter Medications as of 03/25/2022  Medication Sig   fluticasone (FLONASE) 50 MCG/ACT nasal spray Use 2 spray(s) in each nostril once daily   fluticasone-salmeterol (ADVAIR) 250-50 MCG/ACT AEPB Inhale 1 puff into the lungs in the morning and at bedtime.   ipratropium-albuterol (DUONEB) 0.5-2.5 (3) MG/3ML SOLN Take 3 mLs by nebulization 2 (two) times daily.   PROAIR HFA 108 (90 Base) MCG/ACT inhaler Inhale 2 puffs into the lungs every 4 (four) hours as needed for wheezing or shortness of breath.   alendronate (FOSAMAX) 70 MG tablet Take 70 mg by mouth once a week.    aspirin 81 MG tablet Take 81 mg by mouth daily.   atorvastatin (LIPITOR) 80 MG tablet TAKE 1 TABLET BY MOUTH ONCE  DAILY AT  6PM   folic acid (FOLVITE) 1 MG tablet Take 1 mg by mouth daily.   meclizine (ANTIVERT) 25 MG tablet Take 1 tablet (25 mg total) by mouth 3 (three) times daily as needed for dizziness.   metFORMIN (GLUCOPHAGE) 500 MG tablet TAKE 1 TABLET BY MOUTH TWICE DAILY WITH A MEAL   methotrexate 2.5 MG tablet Take 12.5 mg by mouth once a week.   metoprolol tartrate (LOPRESSOR) 25 MG tablet Take 1 tablet by mouth twice daily   Multiple Vitamins-Minerals (PRESERVISION AREDS 2) CAPS Take 1 capsule by mouth daily.   nitroGLYCERIN (NITROSTAT) 0.4 MG SL tablet Place 1 tablet (0.4 mg total) under the tongue every 5 (five) minutes as needed for chest pain.   ondansetron (ZOFRAN-ODT) 4 MG disintegrating tablet Take 1 tablet (4 mg total) by mouth every 8 (eight) hours as needed for nausea or vomiting.   Vitamin D, Ergocalciferol, (DRISDOL) 50000 units CAPS capsule Take 50,000 Units by mouth once a week.   No facility-administered encounter medications on file as of 03/25/2022.    Patient Active Problem List   Diagnosis Date Noted   CAD (coronary artery disease) 01/25/2022   Wrist pain 11/29/2021   Polyarthritis 10/31/2021   Limited mobility    Dizziness 10/26/2021   History of COVID-19 06/27/2021   COPD (chronic obstructive pulmonary disease) (HCC) 05/21/2021   Hypomagnesemia 05/14/2021   Hypokalemia    Asthma exacerbation    Joint ache 04/09/2021   Calcified granuloma of lung (HCC) 03/02/2021   Acute exacerbation of chronic obstructive pulmonary disease (COPD) (HCC) 03/01/2021  COPD exacerbation (HCC) 11/29/2020   Chronic diastolic CHF (congestive heart failure) (HCC) 11/29/2020   CAP (community acquired pneumonia) 11/29/2020   Rib pain on right side 10/19/2020   Abnormal CXR 10/19/2020   Type 2 diabetes mellitus with cardiac complication (HCC) 10/08/2020   Elevated troponin 10/08/2020   Acute on chronic heart failure with preserved ejection fraction (HFpEF) (HCC) 10/08/2020   COPD with  acute exacerbation (HCC) 08/19/2020   History of non-ST elevation myocardial infarction (NSTEMI) 08/07/2020   Asthma 08/07/2020   Leukocytosis 08/07/2020   Left shoulder pain 07/12/2020   Iron deficiency 04/16/2020   Anemia 04/04/2020   SOB (shortness of breath) 03/25/2020   Cough 09/30/2019   Gallstone pancreatitis    Pre-op evaluation 08/30/2019   Cholecystitis 08/05/2019   Choledocholithiasis    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) 12/29/2018   Chest tightness 10/07/2018   Memory change 05/27/2018   Osteoporosis 02/05/2018   Weight loss 06/24/2017   Abdominal pain, left lower quadrant 10/05/2016   Polymyalgia rheumatica syndrome (HCC) 05/16/2016   Long term current use of systemic steroids 05/16/2016   Elevated erythrocyte sedimentation rate 05/04/2016   Back pain 04/29/2016   Fatigue 05/24/2015   Health care maintenance 01/25/2015   UTI (urinary tract infection) 07/07/2014   Neuropathy 03/24/2014   Diverticulitis 02/24/2013   Reactive airway disease 09/15/2012   Hypertension 09/14/2012   Hypercholesterolemia 09/14/2012   Diabetes mellitus with cardiac complication (HCC) 09/14/2012    Conditions to be addressed/monitored:CHF, COPD, and DMII  Care Plan : [facility]  Updates made by Maple Mirzaarpley, Tamula Morrical D, RN since 03/25/2022 12:00 AM     Problem: Patient with increase ER and hospitalizations related to shortness of breath COPD exacerbations   Priority: High     Long-Range Goal: Patient will report no emergency room visits within the next 90 days   Start Date: 06/04/2021  Expected End Date: 12/28/2021  Recent Progress: Not on track  Priority: High  Note:   [place] Barriers:  Knowledge Deficits related to plan of care for management of CHF, COPD, and DMII  Chronic Disease Management support and education needs related to CHF, COPD, and DMII; Patient reports recent ED visit for CHEST PAIN AND SOB.  Blood sugar FASTING was 122 with  recent ranges of 120's.  Has not used nebulizer in the last few weeks  Weight 118 pounds.  DENIES ANY CHEST PAINS OR SOB SINCE ER VISIT. 4/3--Hospital overnight stay for chest heaviness and SOB (smothering); treated for possible pneumonia with antibiotics an prednisone taper.  States she feels better.  Reports she is taking increase taper of prednisone and antibiotics.  Denies any chest pains or increase shortness of breath.  Has not been weighing, states she needs batteries for her scale.  Checks blood sugars daily.  Despite increase in Hgb A1C patient reports fasting ranges of 120's.  Denies any hyperglycemia. 4/24--reports doing good; denies any recent falls.  Denies any SOB or dizziness.  States she does not remember any chest pains since hospitalization.  Weight 128 pounds.  Per patient fasting blood sugar 120 with recent ranges 120's 5/26--doesn't remember recent ED visit for SOB and chest pains; states the kids must of made me go.  Denies chest pain and SOB today.  Denies swelling, dizziness or recent falls.  Reports compliance with maintenance inhaler and using nebulizer with SOB about twice a week.  weight 118 pounds.  states she hasn't been checking blood sugars regularly  RNCM  Clinical Goal(s):  Patient will verbalize understanding of plan for management of CHF, COPD, and DMII verbalize basic understanding of CHF, COPD, and DMII disease process and self health management plan to manage conditions take all medications exactly as prescribed and will call provider for medication related questions demonstrate a decrease in CHF, COPD, and DMII exacerbations requiring emergency room visits or hospitalizations experience decrease in ED visits. ED visits in last 6 months = 5 (2 admissions) through collaboration with RN Care manager, provider, and care team.   Interventions: 1:1 collaboration with primary care provider regarding development and update of comprehensive plan of care as evidenced by  provider attestation and co-signature Inter-disciplinary care team collaboration (see longitudinal plan of care) Evaluation of current treatment plan related to  self management and patient's adherence to plan as established by provider; Discussed referral placed to Dr. Meredeth Ide for follow up appointment  COPD: (Status: Goal on track: NO.)  Long Term Goal Provided patient with basic written and verbal COPD education on self care/management/and exacerbation prevention; Advised patient to track and manage COPD triggers;  Provided instruction about proper use of medications used for management of COPD including inhalers; Advised patient to self assesses COPD action plan zone and make appointment with provider if in the yellow zone for 48 hours without improvement; Discussed the importance of adequate rest and management of fatigue with COPD; Encouraged patient to use nebulizer if rescue inhaler not helping shortness of breath Discussed contacting providers for shortness of breath or increased need for rescue inhaler or nebulizer prior to needing to call EMS Encouraged to increase activity as tolerated-   Heart Failure Interventions:  (Status: Goal on track: NO.)  Long Term Goal Basic overview and discussion of pathophysiology of Heart Failure reviewed; Provided education on low sodium diet; Reviewed Heart Failure Action Plan in depth and provided written copy; Provided education about placing scale on hard, flat surface; Advised patient to weigh each morning after emptying bladder; Discussed importance of daily weight and advised patient to weigh and record daily; Discussed the importance of keeping all appointments with provider; Discussed when to call provider based on weights and symptoms Fall precautions and preventions reviewed and discussed Encouraged patient to ask family to help her change batteries in scale  Diabetes:  (Status: Goal on Track (progressing): YES.)  Long Term Goal Lab  Results  Component Value Date   HGBA1C 8.0 (H) 01/25/2022  Assessed patient's understanding of A1c goal: <7% Provided education to patient about basic DM disease process; Reviewed medications with patient and discussed importance of medication adherence;        Reviewed prescribed diet with patient low salt carb modified diabetic; Advised patient, providing education and rationale, to check cbg daily and record        call provider for findings outside established parameters;        Discussed increase in A1C and ways to lower Advised to write down readings for provider review Discussed and  encouraged to resume monitoring blood sugar daily  Patient Goals/Self-Care Activities: Patient will self administer medications as prescribed Patient will attend all scheduled provider appointments Patient will continue to perform ADL's independently Develop a rescue plan & follow rescue plan if symptoms flare-up Eliminate symptom triggers at home Consider using nebulizer for increase in shortness of breath Contact provider for increase shortness of breath and increased need for rescue inhaler  Keep and attend follow-up appointments Weigh self daily and keep in log for provider review Notify if you gain 2-3  pounds overnight of 5 pounds in 5 days Develop a rescue plan & follow rescue plan if symptoms flare-up Know when to call the doctor Low salt and low carbohydrate diabetic diet Check blood sugars at least daily, keeping log for provider review Schedule and attend cardiology hospital follow up appointment      Plan:The care management team will reach out to the patient again over the next 45 days.  Rhae Lerner RN, MSN RN Care Management Coordinator  [facility] [phone] [email or web address]

## 2022-03-29 ENCOUNTER — Ambulatory Visit (INDEPENDENT_AMBULATORY_CARE_PROVIDER_SITE_OTHER): Payer: 59 | Admitting: Cardiovascular Disease

## 2022-03-29 ENCOUNTER — Encounter: Payer: Self-pay | Admitting: Cardiovascular Disease

## 2022-03-29 VITALS — BP 162/78 | HR 75 | Ht 61.0 in | Wt 130.0 lb

## 2022-03-29 DIAGNOSIS — I471 Supraventricular tachycardia: Secondary | ICD-10-CM

## 2022-03-29 DIAGNOSIS — I2510 Atherosclerotic heart disease of native coronary artery without angina pectoris: Secondary | ICD-10-CM

## 2022-03-29 DIAGNOSIS — E785 Hyperlipidemia, unspecified: Secondary | ICD-10-CM | POA: Diagnosis not present

## 2022-03-29 DIAGNOSIS — I10 Essential (primary) hypertension: Secondary | ICD-10-CM

## 2022-03-29 NOTE — Patient Instructions (Signed)
Medication Instructions:  Your physician recommends that you continue on your current medications as directed. Please refer to the Current Medication list given to you today.  *If you need a refill on your cardiac medications before your next appointment, please call your pharmacy*   Lab Work: None orderde If you have labs (blood work) drawn today and your tests are completely normal, you will receive your results only by: MyChart Message (if you have MyChart) OR A paper copy in the mail If you have any lab test that is abnormal or we need to change your treatment, we will call you to review the results.   Testing/Procedures: None ordered   Follow-Up: At [facility], you and your health needs are our priority.  As part of our continuing mission to provide you with exceptional heart care, we have created designated Provider Care Teams.  These Care Teams include your primary Cardiologist (physician) and Advanced Practice Providers (APPs -  Physician Assistants and Nurse Practitioners) who all work together to provide you with the care you need, when you need it.  We recommend signing up for the patient portal called "MyChart".  Sign up information is provided on this After Visit Summary.  MyChart is used to connect with patients for Virtual Visits (Telemedicine).  Patients are able to view lab/test results, encounter notes, upcoming appointments, etc.  Non-urgent messages can be sent to your provider as well.   To learn more about what you can do with MyChart, go to [email or web address].    Your next appointment:   6 month(s)  The format for your next appointment:   In Person  Provider:   You may see Lorine Bears, MD or one of the following Advanced Practice Providers on your designated Care Team:   Nicolasa Ducking, NP Eula Listen, PA-C Cadence Fransico Michael, [place]   Other Instructions N/A  Important Information About Sugar

## 2022-03-29 NOTE — Progress Notes (Signed)
Cardiology Office Note   Date:  03/29/2022   ID:  [name], [name] 08-Jun-1933, MRN [id number]  PCP:  Einar Pheasant, MD  Cardiologist:   Kathlyn Sacramento, MD   Chief Complaint  Patient presents with   Follow-up    6 month F/U-No new cardiac concerns      History of Present Illness: Tanya Cole is a 88 y.o. female who presents for a follow-up visit regarding coronary artery disease. She has known history of coronary artery disease, COPD, diabetes, hypertension and hyperlipidemia.  She had non-ST elevation myocardial infarction in December 2019.  Cardiac catheterization showed 95% mid LAD stenosis and 60% distal LAD stenosis.  The mid LAD stenosis was treated successfully with PCI and drug-eluting stent placement.   She presented in October, 2021 with a small non-STEMI.  Cardiac catheterization was performed which showed significant one-vessel coronary artery disease with patent proximal LAD stent with mild in-stent restenosis, stable 60% mid LAD stenosis and significant 90% distal LAD stenosis close to the apex.  Ejection fraction was normal and LVEDP was mildly elevated.  Distal LAD stenosis was felt to be the culprit but was not amenable to PCI given distal location.   She has recurrent chest pain in the setting of short runs of atrial tachycardia and thus the dose of metoprolol was increased. She was hospitalized in August of 2022 with pleuritic chest pain in the setting of COVID-19 infection.  Echocardiogram while hospitalized with an EF of 55 to 60% with mild to moderate mitral regurgitation.  She was hospitalized in March with shortness of breath and wheezing.  White cell count was elevated and troponin was borderline elevated at 20 and did not trend up.  Symptoms were felt to be atypical overall.  I recommended medical therapy.  Most recent echocardiogram in January showed normal LV systolic function with mild mitral regurgitation and mildly calcified aortic  valve.  She has been doing reasonably well with no recent chest pain.  She reports stable exertional dyspnea.  Past Medical History:  Diagnosis Date   (HFpEF) heart failure with preserved ejection fraction ([place])    a. TTE 12/19: EF 55-60%, probable HK of the mid apical anterior septal myocardium, Gr1DD, mild AI, mildly dilated LA; b.07/2020 Echo: EF 60-65%, no rwma, Gr2 DD. Nl RV fxn. Mildly dil LA. Mild MR.   Asthma    CAD (coronary artery disease)    a. 09/2018 NSTEMI/PCI: LM min irregs, mLAD 95 (PCI/DES), mLAD-2 60%, LCx mild diff dzs, RCA min irregs; b. 03/2020 MV: EF>65%, no ischemia/scar; c. 07/2020 Cath: LM min irregs, LAD 30p, 22m, 90d, D1/2 min irregs, LCX diff dzs throughout, OM1/2/3 mild dzs, RCA 30p, RPDA/RPAV min irrges. EF 55-65%.   CHF (congestive heart failure) ([place])    Diabetes mellitus ([place])    Hypercholesterolemia    Hypertension    Myocardial infarction ([place])    Osteopenia    Palpitations    a. 04/2020 Zio: Avg HR 75. 429 SVT episodes, longest 19 secs @ 133. Occas PACs (3.2%). Rare PVCs (<1%).   Polymyalgia rheumatica syndrome (HCC)    Reactive airway disease    Severe sepsis [facility])     Past Surgical History:  Procedure Laterality Date   ABDOMINAL HYSTERECTOMY  1981   prolapse and bleeding, ovaries not removed   BREAST EXCISIONAL BIOPSY Right    CHOLECYSTECTOMY N/A 09/02/2019   Procedure: LAPAROSCOPIC CHOLECYSTECTOMY WITH INTRAOPERATIVE CHOLANGIOGRAM;  Surgeon: Olean Ree, MD;  Location: [facility] ORS;  Service: General;  Laterality: N/A;   CORONARY STENT INTERVENTION N/A 10/08/2018   Procedure: CORONARY STENT INTERVENTION;  Surgeon: Wellington Hampshire, MD;  Location: [facility];  Service: Cardiovascular;  Laterality: N/A;   ENDOSCOPIC RETROGRADE CHOLANGIOPANCREATOGRAPHY (ERCP) WITH PROPOFOL N/A 08/08/2019   Procedure: ENDOSCOPIC RETROGRADE CHOLANGIOPANCREATOGRAPHY (ERCP) WITH PROPOFOL;  Surgeon: Lucilla Lame, MD;  Location: [facility] ENDOSCOPY;  Service: Endoscopy;   Laterality: N/A;   LEFT HEART CATH AND CORONARY ANGIOGRAPHY N/A 10/08/2018   Procedure: LEFT HEART CATH AND CORONARY ANGIOGRAPHY;  Surgeon: Wellington Hampshire, MD;  Location: [place] [facility];  Service: Cardiovascular;  Laterality: N/A;   LEFT HEART CATH AND CORONARY ANGIOGRAPHY N/A 08/10/2020   Procedure: LEFT HEART CATH AND CORONARY ANGIOGRAPHY possible percutaneous intervention;  Surgeon: Wellington Hampshire, MD;  Location: [place] CV LAB;  Service: Cardiovascular;  Laterality: N/A;   UMBILICAL HERNIA REPAIR  7/94     Current Outpatient Medications  Medication Sig Dispense Refill   alendronate (FOSAMAX) 70 MG tablet Take 70 mg by mouth once a week.      aspirin 81 MG tablet Take 81 mg by mouth daily.     atorvastatin (LIPITOR) 80 MG tablet TAKE 1 TABLET BY MOUTH ONCE DAILY AT  6PM 90 tablet 3   fluticasone (FLONASE) 50 MCG/ACT nasal spray Use 2 spray(s) in each nostril once daily 16 g 0   fluticasone-salmeterol (ADVAIR) 250-50 MCG/ACT AEPB Inhale 1 puff into the lungs in the morning and at bedtime. 1 each 5   folic acid (FOLVITE) 1 MG tablet Take 1 mg by mouth daily.     ipratropium-albuterol (DUONEB) 0.5-2.5 (3) MG/3ML SOLN Take 3 mLs by nebulization 2 (two) times daily. 360 mL 7   meclizine (ANTIVERT) 25 MG tablet Take 1 tablet (25 mg total) by mouth 3 (three) times daily as needed for dizziness. 30 tablet 0   metFORMIN (GLUCOPHAGE) 500 MG tablet TAKE 1 TABLET BY MOUTH TWICE DAILY WITH A MEAL 180 tablet 0   methotrexate 2.5 MG tablet Take 12.5 mg by mouth once a week.     metoprolol tartrate (LOPRESSOR) 25 MG tablet Take 1 tablet by mouth twice daily 180 tablet 0   Multiple Vitamins-Minerals (PRESERVISION AREDS 2) CAPS Take 1 capsule by mouth daily.     nitroGLYCERIN (NITROSTAT) 0.4 MG SL tablet Place 1 tablet (0.4 mg total) under the tongue every 5 (five) minutes as needed for chest pain. 30 tablet 12   ondansetron (ZOFRAN-ODT) 4 MG disintegrating tablet Take 1 tablet (4 mg total)  by mouth every 8 (eight) hours as needed for nausea or vomiting. 12 tablet 0   PROAIR HFA 108 (90 Base) MCG/ACT inhaler Inhale 2 puffs into the lungs every 4 (four) hours as needed for wheezing or shortness of breath. 18 g 0   Vitamin D, Ergocalciferol, (DRISDOL) 50000 units CAPS capsule Take 50,000 Units by mouth once a week.  3   No current facility-administered medications for this visit.    Allergies:   Tramadol    Social History:  The patient  reports that she has never smoked. She has never used smokeless tobacco. She reports that she does not drink alcohol and does not use drugs.   Family History:  The patient's family history includes Arthritis in her mother; COPD in her brother and brother; Heart attack in her father; Heart disease in her mother; Parkinson's disease in her sister; Throat cancer in her sister.    ROS:  Please see the history of present illness.  Otherwise, review of systems are positive for none.   All other systems are reviewed and negative.    PHYSICAL EXAM: VS:  BP (!) 162/78 (BP Location: Left Arm, Patient Position: Sitting, Cuff Size: Normal)   Pulse 75   Ht 5\' 1"  (1.549 m)   Wt 130 lb (59 kg)   SpO2 97%   BMI 24.56 kg/m  , BMI Body mass index is 24.56 kg/m. GEN: Well nourished, well developed, in no acute distress  HEENT: normal  Neck: no JVD or masses.  No carotid bruits. Cardiac: RRR; no rubs, or gallops,no edema .  1 / 6 systolic murmur in the aortic area. Respiratory:  clear to auscultation bilaterally, normal work of breathing GI: soft, nontender, nondistended, + BS MS: no deformity or atrophy  Skin: warm and dry, no rash Neuro:  Strength and sensation are intact Psych: euthymic mood, full affect Right radial pulses normal with no hematoma.  EKG:  EKG is ordered today. EKG showed normal sinus rhythm with no significant ST or T wave changes.   Recent Labs: 06/30/2021: Magnesium 1.7 07/23/2021: TSH 2.50 01/25/2022: B Natriuretic Peptide  123.5 03/07/2022: ALT 19; BUN 16; Creatinine, Ser 0.69; Hemoglobin 12.0; Platelets 303; Potassium 3.6; Sodium 140    Lipid Panel    Component Value Date/Time   CHOL 112 01/26/2022 0623   TRIG 137 01/26/2022 0623   HDL 35 (L) 01/26/2022 0623   CHOLHDL 3.2 01/26/2022 0623   VLDL 27 01/26/2022 0623   LDLCALC 50 01/26/2022 0623   LDLDIRECT 136.0 07/03/2020 1139      Wt Readings from Last 3 Encounters:  03/29/22 130 lb (59 kg)  03/07/22 118 lb (53.5 kg)  02/18/22 128 lb (58.1 kg)         View : No data to display.            ASSESSMENT AND PLAN:  1.  Coronary artery disease involving native coronary arteries without angina: She is doing reasonably well at the present time with no significant angina.    Continue medical therapy.    2.  Essential hypertension: Blood pressure is elevated today but she reports walking to the clinic.  I looked at her recent blood pressure trends and usually her blood pressure is below 140.  I made no changes.  3.  Hyperlipidemia: Continue high-dose atorvastatin.  I reviewed most recent lipid profile done in March which showed an LDL of 50.  4.  Atrial tachycardia: Symptoms are well controlled on metoprolol.    Disposition:   FU with me in 6 months  Signed,  Kathlyn Sacramento, MD  03/29/2022 3:05 PM    [facility]

## 2022-03-30 DIAGNOSIS — J449 Chronic obstructive pulmonary disease, unspecified: Secondary | ICD-10-CM

## 2022-03-30 DIAGNOSIS — E1159 Type 2 diabetes mellitus with other circulatory complications: Secondary | ICD-10-CM | POA: Diagnosis not present

## 2022-03-30 DIAGNOSIS — I509 Heart failure, unspecified: Secondary | ICD-10-CM | POA: Diagnosis not present

## 2022-03-30 DIAGNOSIS — Z7984 Long term (current) use of oral hypoglycemic drugs: Secondary | ICD-10-CM

## 2022-03-31 ENCOUNTER — Encounter: Payer: Self-pay | Admitting: Internal Medicine

## 2022-04-01 ENCOUNTER — Other Ambulatory Visit: Payer: Self-pay

## 2022-04-01 ENCOUNTER — Emergency Department
Admission: EM | Admit: 2022-04-01 | Discharge: 2022-04-01 | Disposition: A | Discharge status: Home / Self Care | Payer: Medicare Other | Source: Home / Self Care | Attending: Emergency Medicine | Admitting: Emergency Medicine

## 2022-04-01 ENCOUNTER — Emergency Department: Payer: Medicare Other

## 2022-04-01 DIAGNOSIS — Z808 Family history of malignant neoplasm of other organs or systems: Secondary | ICD-10-CM | POA: Diagnosis not present

## 2022-04-01 DIAGNOSIS — J449 Chronic obstructive pulmonary disease, unspecified: Secondary | ICD-10-CM | POA: Diagnosis not present

## 2022-04-01 DIAGNOSIS — F03A2 Unspecified dementia, mild, with psychotic disturbance: Secondary | ICD-10-CM | POA: Diagnosis not present

## 2022-04-01 DIAGNOSIS — I499 Cardiac arrhythmia, unspecified: Secondary | ICD-10-CM | POA: Diagnosis not present

## 2022-04-01 DIAGNOSIS — J441 Chronic obstructive pulmonary disease with (acute) exacerbation: Secondary | ICD-10-CM | POA: Insufficient documentation

## 2022-04-01 DIAGNOSIS — J189 Pneumonia, unspecified organism: Secondary | ICD-10-CM | POA: Diagnosis present

## 2022-04-01 DIAGNOSIS — I6523 Occlusion and stenosis of bilateral carotid arteries: Secondary | ICD-10-CM | POA: Diagnosis not present

## 2022-04-01 DIAGNOSIS — R519 Headache, unspecified: Secondary | ICD-10-CM | POA: Insufficient documentation

## 2022-04-01 DIAGNOSIS — Z66 Do not resuscitate: Secondary | ICD-10-CM | POA: Diagnosis not present

## 2022-04-01 DIAGNOSIS — I5032 Chronic diastolic (congestive) heart failure: Secondary | ICD-10-CM | POA: Insufficient documentation

## 2022-04-01 DIAGNOSIS — Z8616 Personal history of COVID-19: Secondary | ICD-10-CM | POA: Insufficient documentation

## 2022-04-01 DIAGNOSIS — I2510 Atherosclerotic heart disease of native coronary artery without angina pectoris: Secondary | ICD-10-CM | POA: Insufficient documentation

## 2022-04-01 DIAGNOSIS — Y92009 Unspecified place in unspecified non-institutional (private) residence as the place of occurrence of the external cause: Secondary | ICD-10-CM | POA: Diagnosis not present

## 2022-04-01 DIAGNOSIS — D72829 Elevated white blood cell count, unspecified: Secondary | ICD-10-CM | POA: Diagnosis not present

## 2022-04-01 DIAGNOSIS — R410 Disorientation, unspecified: Secondary | ICD-10-CM | POA: Diagnosis not present

## 2022-04-01 DIAGNOSIS — T380X5A Adverse effect of glucocorticoids and synthetic analogues, initial encounter: Secondary | ICD-10-CM | POA: Diagnosis not present

## 2022-04-01 DIAGNOSIS — E860 Dehydration: Secondary | ICD-10-CM | POA: Diagnosis not present

## 2022-04-01 DIAGNOSIS — G4489 Other headache syndrome: Secondary | ICD-10-CM | POA: Diagnosis not present

## 2022-04-01 DIAGNOSIS — R6889 Other general symptoms and signs: Secondary | ICD-10-CM | POA: Diagnosis not present

## 2022-04-01 DIAGNOSIS — G9341 Metabolic encephalopathy: Secondary | ICD-10-CM | POA: Diagnosis not present

## 2022-04-01 DIAGNOSIS — E161 Other hypoglycemia: Secondary | ICD-10-CM | POA: Diagnosis not present

## 2022-04-01 DIAGNOSIS — I5042 Chronic combined systolic (congestive) and diastolic (congestive) heart failure: Secondary | ICD-10-CM | POA: Diagnosis not present

## 2022-04-01 DIAGNOSIS — E7800 Pure hypercholesterolemia, unspecified: Secondary | ICD-10-CM | POA: Diagnosis not present

## 2022-04-01 DIAGNOSIS — Z955 Presence of coronary angioplasty implant and graft: Secondary | ICD-10-CM | POA: Diagnosis not present

## 2022-04-01 DIAGNOSIS — E1169 Type 2 diabetes mellitus with other specified complication: Secondary | ICD-10-CM | POA: Insufficient documentation

## 2022-04-01 DIAGNOSIS — E119 Type 2 diabetes mellitus without complications: Secondary | ICD-10-CM | POA: Insufficient documentation

## 2022-04-01 DIAGNOSIS — F19951 Other psychoactive substance use, unspecified with psychoactive substance-induced psychotic disorder with hallucinations: Secondary | ICD-10-CM | POA: Diagnosis not present

## 2022-04-01 DIAGNOSIS — G319 Degenerative disease of nervous system, unspecified: Secondary | ICD-10-CM | POA: Diagnosis not present

## 2022-04-01 DIAGNOSIS — I252 Old myocardial infarction: Secondary | ICD-10-CM | POA: Diagnosis not present

## 2022-04-01 DIAGNOSIS — J45901 Unspecified asthma with (acute) exacerbation: Secondary | ICD-10-CM | POA: Insufficient documentation

## 2022-04-01 DIAGNOSIS — G9340 Encephalopathy, unspecified: Secondary | ICD-10-CM | POA: Diagnosis not present

## 2022-04-01 DIAGNOSIS — E876 Hypokalemia: Secondary | ICD-10-CM | POA: Diagnosis not present

## 2022-04-01 DIAGNOSIS — I10 Essential (primary) hypertension: Secondary | ICD-10-CM | POA: Diagnosis not present

## 2022-04-01 DIAGNOSIS — E1140 Type 2 diabetes mellitus with diabetic neuropathy, unspecified: Secondary | ICD-10-CM | POA: Insufficient documentation

## 2022-04-01 DIAGNOSIS — I480 Paroxysmal atrial fibrillation: Secondary | ICD-10-CM | POA: Diagnosis not present

## 2022-04-01 DIAGNOSIS — J440 Chronic obstructive pulmonary disease with acute lower respiratory infection: Secondary | ICD-10-CM | POA: Diagnosis not present

## 2022-04-01 DIAGNOSIS — E1159 Type 2 diabetes mellitus with other circulatory complications: Secondary | ICD-10-CM | POA: Diagnosis not present

## 2022-04-01 DIAGNOSIS — Z743 Need for continuous supervision: Secondary | ICD-10-CM | POA: Diagnosis not present

## 2022-04-01 DIAGNOSIS — E162 Hypoglycemia, unspecified: Secondary | ICD-10-CM | POA: Diagnosis not present

## 2022-04-01 DIAGNOSIS — I110 Hypertensive heart disease with heart failure: Secondary | ICD-10-CM | POA: Insufficient documentation

## 2022-04-01 DIAGNOSIS — M353 Polymyalgia rheumatica: Secondary | ICD-10-CM | POA: Diagnosis not present

## 2022-04-01 DIAGNOSIS — Z7984 Long term (current) use of oral hypoglycemic drugs: Secondary | ICD-10-CM | POA: Diagnosis not present

## 2022-04-01 DIAGNOSIS — Z7952 Long term (current) use of systemic steroids: Secondary | ICD-10-CM | POA: Diagnosis not present

## 2022-04-01 DIAGNOSIS — R0789 Other chest pain: Secondary | ICD-10-CM | POA: Diagnosis not present

## 2022-04-01 DIAGNOSIS — I4891 Unspecified atrial fibrillation: Secondary | ICD-10-CM | POA: Diagnosis not present

## 2022-04-01 LAB — BASIC METABOLIC PANEL
Anion gap: 9 (ref 5–15)
BUN: 13 mg/dL (ref 8–23)
CO2: 28 mmol/L (ref 22–32)
Calcium: 9.1 mg/dL (ref 8.9–10.3)
Chloride: 102 mmol/L (ref 98–111)
Creatinine, Ser: 0.68 mg/dL (ref 0.44–1.00)
GFR, Estimated: >60 mL/min (ref >60)
Glucose, Bld: 253 mg/dL — ABNORMAL HIGH (ref 70–99)
Potassium: 3.4 mmol/L — ABNORMAL LOW (ref 3.5–5.1)
Sodium: 139 mmol/L (ref 135–145)

## 2022-04-01 LAB — CBC WITH DIFFERENTIAL/PLATELET
Abs Immature Granulocytes: 0.09 10*3/uL — ABNORMAL HIGH (ref 0.00–0.07)
Basophils Absolute: 0.1 10*3/uL (ref 0.0–0.1)
Basophils Relative: 0 %
Eosinophils Absolute: 0.1 10*3/uL (ref 0.0–0.5)
Eosinophils Relative: 1 %
HCT: 38.7 % (ref 36.0–46.0)
Hemoglobin: 12.7 g/dL (ref 12.0–15.0)
Immature Granulocytes: 1 %
Lymphocytes Relative: 13 %
Lymphs Abs: 2 10*3/uL (ref 0.7–4.0)
MCH: 29.7 pg (ref 26.0–34.0)
MCHC: 32.8 g/dL (ref 30.0–36.0)
MCV: 90.4 fL (ref 80.0–100.0)
Monocytes Absolute: 0.9 10*3/uL (ref 0.1–1.0)
Monocytes Relative: 6 %
Neutro Abs: 11.7 10*3/uL — ABNORMAL HIGH (ref 1.7–7.7)
Neutrophils Relative %: 79 %
Platelets: 280 10*3/uL (ref 150–400)
RBC: 4.28 MIL/uL (ref 3.87–5.11)
RDW: 13.5 % (ref 11.5–15.5)
WBC: 14.8 10*3/uL — ABNORMAL HIGH (ref 4.0–10.5)
nRBC: 0 % (ref 0.0–0.2)

## 2022-04-01 LAB — SEDIMENTATION RATE: Sed Rate: 17 mm/hr (ref 0–30)

## 2022-04-01 MED ORDER — METOCLOPRAMIDE HCL 5 MG/ML IJ SOLN
10.0000 mg | Freq: Once | INTRAMUSCULAR | Status: AC
  Administered 2022-04-01: 10 mg via INTRAVENOUS
  Filled 2022-04-01: qty 2

## 2022-04-01 MED ORDER — MORPHINE SULFATE (PF) 4 MG/ML IV SOLN
4.0000 mg | Freq: Once | INTRAVENOUS | Status: AC
  Administered 2022-04-01: 4 mg via INTRAVENOUS
  Filled 2022-04-01: qty 1

## 2022-04-01 MED ORDER — ACETAMINOPHEN 500 MG PO TABS
1000.0000 mg | ORAL_TABLET | Freq: Once | ORAL | Status: AC
Start: 2022-04-01 — End: 2022-04-01
  Administered 2022-04-01: 1000 mg via ORAL
  Filled 2022-04-01: qty 2

## 2022-04-01 MED ORDER — IOHEXOL 350 MG/ML SOLN
75.0000 mL | Freq: Once | INTRAVENOUS | Status: AC | PRN
  Administered 2022-04-01: 75 mL via INTRAVENOUS

## 2022-04-01 NOTE — ED Notes (Signed)
Patient to CT at this time

## 2022-04-01 NOTE — ED Provider Notes (Signed)
 [facility] Provider Note    Event Date/Time   First MD Initiated Contact with Patient 04/01/22 1132     (approximate)   History   Headache   HPI  Tanya Cole is a 88 y.o. female  with pmh PMR, CAD, HFrEF, DM who presents with headache.  Patient says headache started around 4 PM yesterday.  Does not exactly recall what she was doing but said it came on rather suddenly and says that it has been the same since it started.  It is throbbing in nature bifrontal.  Denies associated nausea vomiting no visual change numbness tingling weakness.  Denies neck pain or injury.  Patient denies history of similar headache.  Says she does not get headaches often     Past Medical History:  Diagnosis Date   (HFpEF) heart failure with preserved ejection fraction (HCC)    a. TTE 12/19: EF 55-60%, probable HK of the mid apical anterior septal myocardium, Gr1DD, mild AI, mildly dilated LA; b.07/2020 Echo: EF 60-65%, no rwma, Gr2 DD. Nl RV fxn. Mildly dil LA. Mild MR.   Asthma    CAD (coronary artery disease)    a. 09/2018 NSTEMI/PCI: LM min irregs, mLAD 95 (PCI/DES), mLAD-2 60%, LCx mild diff dzs, RCA min irregs; b. 03/2020 MV: EF>65%, no ischemia/scar; c. 07/2020 Cath: LM min irregs, LAD 30p, 60m, 90d, D1/2 min irregs, LCX diff dzs throughout, OM1/2/3 mild dzs, RCA 30p, RPDA/RPAV min irrges. EF 55-65%.   CHF (congestive heart failure) (HCC)    Diabetes mellitus (HCC)    Hypercholesterolemia    Hypertension    Myocardial infarction (HCC)    Osteopenia    Palpitations    a. 04/2020 Zio: Avg HR 75. 429 SVT episodes, longest 19 secs @ 133. Occas PACs (3.2%). Rare PVCs (<1%).   Polymyalgia rheumatica syndrome (HCC)    Reactive airway disease    Severe sepsis (HCC)     Patient Active Problem List   Diagnosis Date Noted   CAD (coronary artery disease) 01/25/2022   Wrist pain 11/29/2021   Polyarthritis 10/31/2021   Limited mobility    Dizziness 10/26/2021   History of  COVID-19 06/27/2021   COPD (chronic obstructive pulmonary disease) (HCC) 05/21/2021   Hypomagnesemia 05/14/2021   Hypokalemia    Asthma exacerbation    Joint ache 04/09/2021   Calcified granuloma of lung (HCC) 03/02/2021   Acute exacerbation of chronic obstructive pulmonary disease (COPD) (HCC) 03/01/2021   COPD exacerbation (HCC) 11/29/2020   Chronic diastolic CHF (congestive heart failure) (HCC) 11/29/2020   CAP (community acquired pneumonia) 11/29/2020   Rib pain on right side 10/19/2020   Abnormal CXR 10/19/2020   Type 2 diabetes mellitus with cardiac complication (HCC) 10/08/2020   Elevated troponin 10/08/2020   Acute on chronic heart failure with preserved ejection fraction (HFpEF) (HCC) 10/08/2020   COPD with acute exacerbation (HCC) 08/19/2020   History of non-ST elevation myocardial infarction (NSTEMI) 08/07/2020   Asthma 08/07/2020   Leukocytosis 08/07/2020   Left shoulder pain 07/12/2020   Iron deficiency 04/16/2020   Anemia 04/04/2020   SOB (shortness of breath) 03/25/2020   Cough 09/30/2019   Gallstone pancreatitis    Pre-op evaluation 08/30/2019   Cholecystitis 08/05/2019   Choledocholithiasis    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) 12/29/2018   Chest tightness 10/07/2018   Memory change 05/27/2018   Osteoporosis 02/05/2018   Weight loss 06/24/2017   Abdominal pain, left lower quadrant 10/05/2016     Polymyalgia rheumatica syndrome (HCC) 05/16/2016   Long term current use of systemic steroids 05/16/2016   Elevated erythrocyte sedimentation rate 05/04/2016   Back pain 04/29/2016   Fatigue 05/24/2015   Health care maintenance 01/25/2015   UTI (urinary tract infection) 07/07/2014   Neuropathy 03/24/2014   Diverticulitis 02/24/2013   Reactive airway disease 09/15/2012   Hypertension 09/14/2012   Hypercholesterolemia 09/14/2012   Diabetes mellitus with cardiac complication (HCC) 09/14/2012     Physical Exam  Triage Vital  Signs: ED Triage Vitals  Enc Vitals Group     BP 04/01/22 1120 (!) 173/71     Pulse Rate 04/01/22 1120 84     Resp 04/01/22 1120 (!) 23     Temp 04/01/22 1120 99.8 F (37.7 C)     Temp Source 04/01/22 1120 Oral     SpO2 04/01/22 1120 97 %     Weight 04/01/22 1122 130 lb (59 kg)     Height 04/01/22 1122 5' 1" (1.549 m)     Head Circumference --      Peak Flow --      Pain Score 04/01/22 1121 6     Pain Loc --      Pain Edu? --      Excl. in GC? --     Most recent vital signs: Vitals:   04/01/22 1300 04/01/22 1400  BP: (!) 162/81 (!) 131/53  Pulse: 97 83  Resp: (!) 21 20  Temp:    SpO2: 95% 95%     General: Awake, pt is tearful CV:  Good peripheral perfusion. No edema Resp:  Normal effort.  Abd:  No distention.  Neuro:             Awake, Alert, Oriented x 3  Other:  Aox3, nml speech  PERRL, EOMI, face symmetric, nml tongue movement  5/5 strength in the BL upper and lower extremities  Sensation grossly intact in the BL upper and lower extremities  Finger-nose-finger intact BL    ED Results / Procedures / Treatments  Labs (all labs ordered are listed, but only abnormal results are displayed) Labs Reviewed  CBC WITH DIFFERENTIAL/PLATELET - Abnormal; Notable for the following components:      Result Value   WBC 14.8 (*)    Neutro Abs 11.7 (*)    Abs Immature Granulocytes 0.09 (*)    All other components within normal limits  BASIC METABOLIC PANEL - Abnormal; Notable for the following components:   Potassium 3.4 (*)    Glucose, Bld 253 (*)    All other components within normal limits  SEDIMENTATION RATE  URINALYSIS, ROUTINE W REFLEX MICROSCOPIC     EKG  EKG interpreted by myself shows an irregular rhythm some P waves conducted with some PACs versus A-fib no clear ischemic changes RADIOLOGY I reviewed and interpreted the CT scan of the brain which does not show any acute intracranial process    PROCEDURES:  Critical Care performed:  No  Procedures  The patient is on the cardiac monitor to evaluate for evidence of arrhythmia and/or significant heart rate changes.   MEDICATIONS ORDERED IN ED: Medications  morphine (PF) 4 MG/ML injection 4 mg (4 mg Intravenous Given 04/01/22 1233)  metoCLOPramide (REGLAN) injection 10 mg (10 mg Intravenous Given 04/01/22 1234)  iohexol (OMNIPAQUE) 350 MG/ML injection 75 mL (75 mLs Intravenous Contrast Given 04/01/22 1217)  acetaminophen (TYLENOL) tablet 1,000 mg (1,000 mg Oral Given 04/01/22 1355)     IMPRESSION / MDM / ASSESSMENT AND   PLAN / ED COURSE  I reviewed the triage vital signs and the nursing notes.                              Patient's presentation is most consistent with acute presentation with potential threat to life or bodily function.  Differential diagnosis includes, but is not limited to, subarachnoid hemorrhage, migraine, intracranial hemorrhage, intracranial mass, temporal arteritis  Patient is a 88 year old female with multiple cardiac comorbidities and prior history of PMR who presents with headache.  Started around 4 PM yesterday she does describe it as somewhat sudden and maximal in onset.  She is tearful on my evaluation neurologic exam is nonfocal.  Patient denies history of similar headaches and I do not see prior documented history in the chart.  Given new headache and 88 year old female I am concerned about the above differential.  We will send ESR to further evaluate for temporal arteritis although the symptomatology is less consistent with this.  We will obtain CTA head and CTA.  We will treat supportively with morphine and Reglan for pain.  CTA is negative for aneurysm and CT head is also negative for any bleeding or other acute abnormality.  After morphine and Reglan patient feeling significant improved says headache is gone.  Her ESR is also normal.  My suspicion for subarachnoid with negative CT and CTA is quite low do not feel that lumbar puncture.  Have a  white count of about 14 she is mildly hyperglycemic but no evidence of DKA.  Considered meningitis however she has no meningismus does not look toxic think this is less likely.  Viral illness certainly possible.  Patient was also given Tylenol.  Given she is feeling improved with negative imaging I think she is appropriate for discharge.  We discussed return for any worsening symptoms.   FINAL CLINICAL IMPRESSION(S) / ED DIAGNOSES   Final diagnoses:  Nonintractable headache, unspecified chronicity pattern, unspecified headache type     Rx / DC Orders   ED Discharge Orders     None        Note:  This document was prepared using Dragon voice recognition software and may include unintentional dictation errors.   Rada Hay, MD 04/01/22 [phone]

## 2022-04-01 NOTE — ED Triage Notes (Signed)
Pt arrives to ED via EMS with complaints of headache that started 24 hours ago. Pt states that she took sudafed yesterday and tylenol today with no relief. Hx of afib; HR with EMS 100-120. Blood sugar from IV reported by EMS is 327. Blood pressure 170/80 with EMS. Family reported to EMS that pt is not fully compliant with blood pressure meds, but took them today. Pt denies infection and denies visual disturbances.

## 2022-04-01 NOTE — Discharge Instructions (Addendum)
Your CAT scans were reassuring.  You can take Tylenol for your pain.  If your headache recurs and is not going away please return to the emergency department.  If you develop fevers or any confusion please also return to the emergency department.

## 2022-04-02 ENCOUNTER — Encounter: Payer: Self-pay | Admitting: Emergency Medicine

## 2022-04-02 ENCOUNTER — Emergency Department: Payer: Medicare Other

## 2022-04-02 ENCOUNTER — Other Ambulatory Visit: Payer: Self-pay

## 2022-04-02 ENCOUNTER — Observation Stay: Payer: Medicare Other

## 2022-04-02 ENCOUNTER — Inpatient Hospital Stay
Admission: EM | Admit: 2022-04-02 | Discharge: 2022-04-06 | DRG: 070 | Disposition: A | Discharge status: Home Health | Payer: Medicare Other | Attending: Osteopathic Medicine | Admitting: Osteopathic Medicine

## 2022-04-02 ENCOUNTER — Encounter: Payer: Self-pay | Admitting: Internal Medicine

## 2022-04-02 DIAGNOSIS — D72829 Elevated white blood cell count, unspecified: Secondary | ICD-10-CM | POA: Diagnosis present

## 2022-04-02 DIAGNOSIS — Z885 Allergy status to narcotic agent status: Secondary | ICD-10-CM

## 2022-04-02 DIAGNOSIS — J189 Pneumonia, unspecified organism: Secondary | ICD-10-CM | POA: Diagnosis present

## 2022-04-02 DIAGNOSIS — F19951 Other psychoactive substance use, unspecified with psychoactive substance-induced psychotic disorder with hallucinations: Secondary | ICD-10-CM | POA: Diagnosis present

## 2022-04-02 DIAGNOSIS — Y92009 Unspecified place in unspecified non-institutional (private) residence as the place of occurrence of the external cause: Secondary | ICD-10-CM

## 2022-04-02 DIAGNOSIS — Z66 Do not resuscitate: Secondary | ICD-10-CM | POA: Diagnosis present

## 2022-04-02 DIAGNOSIS — Z79899 Other long term (current) drug therapy: Secondary | ICD-10-CM

## 2022-04-02 DIAGNOSIS — R410 Disorientation, unspecified: Secondary | ICD-10-CM | POA: Diagnosis not present

## 2022-04-02 DIAGNOSIS — E1169 Type 2 diabetes mellitus with other specified complication: Secondary | ICD-10-CM | POA: Diagnosis present

## 2022-04-02 DIAGNOSIS — I5032 Chronic diastolic (congestive) heart failure: Secondary | ICD-10-CM

## 2022-04-02 DIAGNOSIS — Z7984 Long term (current) use of oral hypoglycemic drugs: Secondary | ICD-10-CM

## 2022-04-02 DIAGNOSIS — T380X5A Adverse effect of glucocorticoids and synthetic analogues, initial encounter: Secondary | ICD-10-CM | POA: Diagnosis present

## 2022-04-02 DIAGNOSIS — Z955 Presence of coronary angioplasty implant and graft: Secondary | ICD-10-CM

## 2022-04-02 DIAGNOSIS — Z9049 Acquired absence of other specified parts of digestive tract: Secondary | ICD-10-CM

## 2022-04-02 DIAGNOSIS — Z90710 Acquired absence of both cervix and uterus: Secondary | ICD-10-CM

## 2022-04-02 DIAGNOSIS — Z7951 Long term (current) use of inhaled steroids: Secondary | ICD-10-CM

## 2022-04-02 DIAGNOSIS — Z7983 Long term (current) use of bisphosphonates: Secondary | ICD-10-CM

## 2022-04-02 DIAGNOSIS — E7800 Pure hypercholesterolemia, unspecified: Secondary | ICD-10-CM | POA: Diagnosis not present

## 2022-04-02 DIAGNOSIS — I252 Old myocardial infarction: Secondary | ICD-10-CM

## 2022-04-02 DIAGNOSIS — G9341 Metabolic encephalopathy: Secondary | ICD-10-CM | POA: Diagnosis not present

## 2022-04-02 DIAGNOSIS — J449 Chronic obstructive pulmonary disease, unspecified: Secondary | ICD-10-CM | POA: Diagnosis not present

## 2022-04-02 DIAGNOSIS — Z808 Family history of malignant neoplasm of other organs or systems: Secondary | ICD-10-CM

## 2022-04-02 DIAGNOSIS — I5042 Chronic combined systolic (congestive) and diastolic (congestive) heart failure: Secondary | ICD-10-CM | POA: Diagnosis present

## 2022-04-02 DIAGNOSIS — M353 Polymyalgia rheumatica: Secondary | ICD-10-CM | POA: Diagnosis present

## 2022-04-02 DIAGNOSIS — E860 Dehydration: Secondary | ICD-10-CM | POA: Diagnosis present

## 2022-04-02 DIAGNOSIS — E1159 Type 2 diabetes mellitus with other circulatory complications: Secondary | ICD-10-CM

## 2022-04-02 DIAGNOSIS — G319 Degenerative disease of nervous system, unspecified: Secondary | ICD-10-CM | POA: Diagnosis not present

## 2022-04-02 DIAGNOSIS — G9340 Encephalopathy, unspecified: Principal | ICD-10-CM

## 2022-04-02 DIAGNOSIS — F03A2 Unspecified dementia, mild, with psychotic disturbance: Secondary | ICD-10-CM | POA: Diagnosis present

## 2022-04-02 DIAGNOSIS — E876 Hypokalemia: Secondary | ICD-10-CM | POA: Diagnosis present

## 2022-04-02 DIAGNOSIS — Z8261 Family history of arthritis: Secondary | ICD-10-CM

## 2022-04-02 DIAGNOSIS — Z8616 Personal history of COVID-19: Secondary | ICD-10-CM

## 2022-04-02 DIAGNOSIS — R519 Headache, unspecified: Secondary | ICD-10-CM | POA: Diagnosis present

## 2022-04-02 DIAGNOSIS — I110 Hypertensive heart disease with heart failure: Secondary | ICD-10-CM | POA: Diagnosis present

## 2022-04-02 DIAGNOSIS — Z8249 Family history of ischemic heart disease and other diseases of the circulatory system: Secondary | ICD-10-CM

## 2022-04-02 DIAGNOSIS — Z825 Family history of asthma and other chronic lower respiratory diseases: Secondary | ICD-10-CM

## 2022-04-02 DIAGNOSIS — J440 Chronic obstructive pulmonary disease with acute lower respiratory infection: Secondary | ICD-10-CM | POA: Diagnosis present

## 2022-04-02 DIAGNOSIS — Z820 Family history of epilepsy and other diseases of the nervous system: Secondary | ICD-10-CM

## 2022-04-02 DIAGNOSIS — I480 Paroxysmal atrial fibrillation: Secondary | ICD-10-CM | POA: Diagnosis present

## 2022-04-02 DIAGNOSIS — I2510 Atherosclerotic heart disease of native coronary artery without angina pectoris: Secondary | ICD-10-CM | POA: Diagnosis present

## 2022-04-02 DIAGNOSIS — Z7952 Long term (current) use of systemic steroids: Secondary | ICD-10-CM

## 2022-04-02 DIAGNOSIS — E1140 Type 2 diabetes mellitus with diabetic neuropathy, unspecified: Secondary | ICD-10-CM | POA: Diagnosis present

## 2022-04-02 DIAGNOSIS — I10 Essential (primary) hypertension: Secondary | ICD-10-CM | POA: Diagnosis present

## 2022-04-02 LAB — URINE DRUG SCREEN, QUALITATIVE (ARMC ONLY)
Amphetamines, Ur Screen: NOT DETECTED
Barbiturates, Ur Screen: NOT DETECTED
Benzodiazepine, Ur Scrn: NOT DETECTED
Cannabinoid 50 Ng, Ur ~~LOC~~: NOT DETECTED
Cocaine Metabolite,Ur ~~LOC~~: NOT DETECTED
MDMA (Ecstasy)Ur Screen: NOT DETECTED
Methadone Scn, Ur: NOT DETECTED
Opiate, Ur Screen: POSITIVE — AB
Phencyclidine (PCP) Ur S: NOT DETECTED
Tricyclic, Ur Screen: NOT DETECTED

## 2022-04-02 LAB — CBC
HCT: 42 % (ref 36.0–46.0)
Hemoglobin: 13.7 g/dL (ref 12.0–15.0)
MCH: 29.5 pg (ref 26.0–34.0)
MCHC: 32.6 g/dL (ref 30.0–36.0)
MCV: 90.5 fL (ref 80.0–100.0)
Platelets: 326 10*3/uL (ref 150–400)
RBC: 4.64 MIL/uL (ref 3.87–5.11)
RDW: 13.7 % (ref 11.5–15.5)
WBC: 17.4 10*3/uL — ABNORMAL HIGH (ref 4.0–10.5)
nRBC: 0 % (ref 0.0–0.2)

## 2022-04-02 LAB — URINALYSIS, ROUTINE W REFLEX MICROSCOPIC
Bacteria, UA: NONE SEEN
Bilirubin Urine: NEGATIVE
Glucose, UA: NEGATIVE mg/dL
Hgb urine dipstick: NEGATIVE
Ketones, ur: 5 mg/dL — AB
Leukocytes,Ua: NEGATIVE
Nitrite: NEGATIVE
Protein, ur: 100 mg/dL — AB
Specific Gravity, Urine: 1.041 — ABNORMAL HIGH (ref 1.005–1.030)
pH: 5 (ref 5.0–8.0)

## 2022-04-02 LAB — COMPREHENSIVE METABOLIC PANEL
ALT: 33 U/L (ref 0–44)
AST: 41 U/L (ref 15–41)
Albumin: 4.6 g/dL (ref 3.5–5.0)
Alkaline Phosphatase: 80 U/L (ref 38–126)
Anion gap: 16 — ABNORMAL HIGH (ref 5–15)
BUN: 22 mg/dL (ref 8–23)
CO2: 24 mmol/L (ref 22–32)
Calcium: 9.1 mg/dL (ref 8.9–10.3)
Chloride: 104 mmol/L (ref 98–111)
Creatinine, Ser: 1.08 mg/dL — ABNORMAL HIGH (ref 0.44–1.00)
GFR, Estimated: 50 mL/min — ABNORMAL LOW (ref >60)
Glucose, Bld: 154 mg/dL — ABNORMAL HIGH (ref 70–99)
Potassium: 3.5 mmol/L (ref 3.5–5.1)
Sodium: 144 mmol/L (ref 135–145)
Total Bilirubin: 0.8 mg/dL (ref 0.3–1.2)
Total Protein: 7.9 g/dL (ref 6.5–8.1)

## 2022-04-02 LAB — BLOOD GAS, VENOUS
Acid-Base Excess: 0.3 mmol/L (ref 0.0–2.0)
Bicarbonate: 24.6 mmol/L (ref 20.0–28.0)
O2 Saturation: 95.4 %
Patient temperature: 37
pCO2, Ven: 38 mmHg — ABNORMAL LOW (ref 44–60)
pH, Ven: 7.42 (ref 7.25–7.43)
pO2, Ven: 75 mmHg — ABNORMAL HIGH (ref 32–45)

## 2022-04-02 LAB — GLUCOSE, CAPILLARY
Glucose-Capillary: 152 mg/dL — ABNORMAL HIGH (ref 70–99)
Glucose-Capillary: 103 mg/dL — ABNORMAL HIGH (ref 70–99)

## 2022-04-02 LAB — TSH: TSH: 1.112 u[IU]/mL (ref 0.350–4.500)

## 2022-04-02 LAB — SARS CORONAVIRUS 2 BY RT PCR: SARS Coronavirus 2 by RT PCR: NEGATIVE

## 2022-04-02 MED ORDER — HYDRALAZINE HCL 20 MG/ML IJ SOLN: 5.0000 mg | INTRAMUSCULAR | Status: DC | PRN

## 2022-04-02 MED ORDER — INSULIN ASPART 100 UNIT/ML IJ SOLN: 0.0000 [IU] | Freq: Every day | INTRAMUSCULAR | Status: DC

## 2022-04-02 MED ORDER — FOLIC ACID 1 MG PO TABS
1.0000 mg | ORAL_TABLET | Freq: Every day | ORAL | Status: DC
  Administered 2022-04-03 – 2022-04-06 (×4): 1 mg via ORAL
  Filled 2022-04-02 (×4): qty 1

## 2022-04-02 MED ORDER — METOPROLOL TARTRATE 25 MG PO TABS
25.0000 mg | ORAL_TABLET | Freq: Two times a day (BID) | ORAL | Status: DC
Start: 2022-04-02 — End: 2022-04-04
  Administered 2022-04-02 – 2022-04-04 (×4): 25 mg via ORAL
  Filled 2022-04-02 (×4): qty 1

## 2022-04-02 MED ORDER — ONDANSETRON HCL 4 MG/2ML IJ SOLN: 4.0000 mg | Freq: Three times a day (TID) | INTRAMUSCULAR | Status: DC | PRN

## 2022-04-02 MED ORDER — ACETAMINOPHEN 325 MG PO TABS
650.0000 mg | ORAL_TABLET | Freq: Four times a day (QID) | ORAL | Status: DC | PRN
  Administered 2022-04-03 – 2022-04-05 (×4): 650 mg via ORAL
  Filled 2022-04-02 (×4): qty 2

## 2022-04-02 MED ORDER — HALOPERIDOL LACTATE 5 MG/ML IJ SOLN
2.0000 mg | Freq: Once | INTRAMUSCULAR | Status: AC
  Administered 2022-04-02: 2 mg via INTRAVENOUS
  Filled 2022-04-02: qty 1

## 2022-04-02 MED ORDER — ASPIRIN 81 MG PO TBEC
81.0000 mg | DELAYED_RELEASE_TABLET | Freq: Every day | ORAL | Status: DC
  Administered 2022-04-03 – 2022-04-06 (×4): 81 mg via ORAL
  Filled 2022-04-02 (×4): qty 1

## 2022-04-02 MED ORDER — NITROGLYCERIN 0.4 MG SL SUBL: 0.4000 mg | SUBLINGUAL_TABLET | SUBLINGUAL | Status: DC | PRN

## 2022-04-02 MED ORDER — IPRATROPIUM-ALBUTEROL 0.5-2.5 (3) MG/3ML IN SOLN
3.0000 mL | Freq: Two times a day (BID) | RESPIRATORY_TRACT | Status: DC
Start: 2022-04-02 — End: 2022-04-03
  Administered 2022-04-02 – 2022-04-03 (×2): 3 mL via RESPIRATORY_TRACT
  Filled 2022-04-02 (×2): qty 3

## 2022-04-02 MED ORDER — MOMETASONE FURO-FORMOTEROL FUM 200-5 MCG/ACT IN AERO
2.0000 | INHALATION_SPRAY | Freq: Two times a day (BID) | RESPIRATORY_TRACT | Status: DC
  Administered 2022-04-02 – 2022-04-06 (×8): 2 via RESPIRATORY_TRACT
  Filled 2022-04-02: qty 8.8

## 2022-04-02 MED ORDER — ENOXAPARIN SODIUM 30 MG/0.3ML IJ SOSY
30.0000 mg | PREFILLED_SYRINGE | INTRAMUSCULAR | Status: DC
  Administered 2022-04-02: 30 mg via SUBCUTANEOUS
  Filled 2022-04-02: qty 0.3

## 2022-04-02 MED ORDER — ALBUTEROL SULFATE (2.5 MG/3ML) 0.083% IN NEBU: 2.5000 mg | INHALATION_SOLUTION | RESPIRATORY_TRACT | Status: DC | PRN

## 2022-04-02 MED ORDER — OCUVITE-LUTEIN PO CAPS
1.0000 | ORAL_CAPSULE | Freq: Every day | ORAL | Status: DC
  Administered 2022-04-03 – 2022-04-06 (×4): 1 via ORAL
  Filled 2022-04-02 (×4): qty 1

## 2022-04-02 MED ORDER — SODIUM CHLORIDE 0.9 % IV BOLUS
1000.0000 mL | Freq: Once | INTRAVENOUS | Status: AC
  Administered 2022-04-02: 1000 mL via INTRAVENOUS

## 2022-04-02 MED ORDER — INSULIN ASPART 100 UNIT/ML IJ SOLN
0.0000 [IU] | Freq: Three times a day (TID) | INTRAMUSCULAR | Status: DC
  Administered 2022-04-02: 2 [IU] via SUBCUTANEOUS
  Administered 2022-04-03: 5 [IU] via SUBCUTANEOUS
  Administered 2022-04-03: 2 [IU] via SUBCUTANEOUS
  Administered 2022-04-03: 3 [IU] via SUBCUTANEOUS
  Administered 2022-04-04 (×3): 2 [IU] via SUBCUTANEOUS
  Administered 2022-04-05: 1 [IU] via SUBCUTANEOUS
  Administered 2022-04-05: 2 [IU] via SUBCUTANEOUS
  Administered 2022-04-05: 1 [IU] via SUBCUTANEOUS
  Administered 2022-04-06 (×2): 2 [IU] via SUBCUTANEOUS
  Filled 2022-04-02 (×9): qty 1

## 2022-04-02 MED ORDER — PREDNISONE 10 MG PO TABS
15.0000 mg | ORAL_TABLET | Freq: Every day | ORAL | Status: DC
Start: 2022-04-03 — End: 2022-04-03
  Administered 2022-04-03: 15 mg via ORAL
  Filled 2022-04-02: qty 2

## 2022-04-02 MED ORDER — MECLIZINE HCL 25 MG PO TABS: 25.0000 mg | ORAL_TABLET | Freq: Three times a day (TID) | ORAL | Status: DC | PRN

## 2022-04-02 NOTE — H&P (Signed)
History and Physical    [place] KPT:[id number] DOB: December 29, 1932 DOA: 04/02/2022  Referring MD/NP/PA:   PCP: Einar Pheasant, MD   Patient coming from:  The patient is coming from home.  At baseline, pt is independent for most of ADL.        Chief Complaint: AMS and   HPI: Tanya Cole is a 88 y.o. female with medical history significant of CAD, dHCF, HTN, HLD, COPD, PMR, who presents with AMS and vision hallucination.  Per patient's daughter at the bedside, patient has been confused in the past several days.  Patient has visual hallucination, seeing people in the room.  Patient does not have unilateral numbness or tinglings in extremities.  No facial droop or slurred speech.  When I saw patient in the ED, she is confused about the time, but is still orientated to the place and person.  Patient does not have chest pain, cough, shortness breath.  No fever or chills.  No nausea, vomiting, diarrhea or abdominal pain.  Denies symptoms of UTI.  Patient had headache yesterday. Patient was seen in the ED yesterday, and had negative CT of head. CTA of head and neck was negative for LVO yesterday.  Data Reviewed and ED Course: pt was found to have WBC 17.4, negative urinalysis, ESR 17 yesterday, creatinine 1.08, BUN 22, temperature normal, blood pressure 148/83, heart rate 97, 75, RR 23, 18, oxygen saturation 97% on room air.  Patient is placed on telemetry bed for observation.  EKG: I have personally reviewed.  Atrial fibrillation, QTc 443, early R wave progression   Review of Systems: Could not be reviewed accurately due to altered mental status.    Allergy:  Allergies  Allergen Reactions   Tramadol Itching and Nausea And Vomiting    Past Medical History:  Diagnosis Date   (HFpEF) heart failure with preserved ejection fraction ([place])    a. TTE 12/19: EF 55-60%, probable HK of the mid apical anterior septal myocardium, Gr1DD, mild AI, mildly dilated LA; b.07/2020 Echo: EF  60-65%, no rwma, Gr2 DD. Nl RV fxn. Mildly dil LA. Mild MR.   Asthma    CAD (coronary artery disease)    a. 09/2018 NSTEMI/PCI: LM min irregs, mLAD 95 (PCI/DES), mLAD-2 60%, LCx mild diff dzs, RCA min irregs; b. 03/2020 MV: EF>65%, no ischemia/scar; c. 07/2020 Cath: LM min irregs, LAD 30p, 33m 90d, D1/2 min irregs, LCX diff dzs throughout, OM1/2/3 mild dzs, RCA 30p, RPDA/RPAV min irrges. EF 55-65%.   CHF (congestive heart failure) ([facility]    Diabetes mellitus ([facility]    Hypercholesterolemia    Hypertension    Myocardial infarction ([place]    Osteopenia    Palpitations    a. 04/2020 Zio: Avg HR 75. 429 SVT episodes, longest 19 secs @ 133. Occas PACs (3.2%). Rare PVCs (<1%).   Polymyalgia rheumatica syndrome (HCC)    Reactive airway disease    Severe sepsis ([place]     Past Surgical History:  Procedure Laterality Date   ABDOMINAL HYSTERECTOMY  1981   prolapse and bleeding, ovaries not removed   BREAST EXCISIONAL BIOPSY Right    CHOLECYSTECTOMY N/A 09/02/2019   Procedure: LAPAROSCOPIC CHOLECYSTECTOMY WITH INTRAOPERATIVE CHOLANGIOGRAM;  Surgeon: POlean Ree MD;  Location: [facility] ORS;  Service: General;  Laterality: N/A;   CORONARY STENT INTERVENTION N/A 10/08/2018   Procedure: CORONARY STENT INTERVENTION;  Surgeon: AWellington Hampshire MD;  [facility];  Service: Cardiovascular;  Laterality: N/A;   ENDOSCOPIC RETROGRADE CHOLANGIOPANCREATOGRAPHY (ERCP)  WITH PROPOFOL N/A 08/08/2019   Procedure: ENDOSCOPIC RETROGRADE CHOLANGIOPANCREATOGRAPHY (ERCP) WITH PROPOFOL;  Surgeon: Lucilla Lame, MD;  Location: [facility] ENDOSCOPY;  Service: Endoscopy;  Laterality: N/A;   LEFT HEART CATH AND CORONARY ANGIOGRAPHY N/A 10/08/2018   Procedure: LEFT HEART CATH AND CORONARY ANGIOGRAPHY;  Surgeon: Wellington Hampshire, MD;  Location: [place] CV LAB;  Service: Cardiovascular;  Laterality: N/A;   LEFT HEART CATH AND CORONARY ANGIOGRAPHY N/A 08/10/2020   Procedure: LEFT HEART CATH AND CORONARY ANGIOGRAPHY  possible percutaneous intervention;  Surgeon: Wellington Hampshire, MD;  Location: [place] [facility] LAB;  Service: Cardiovascular;  Laterality: N/A;   UMBILICAL HERNIA REPAIR  7/94    Social History:  reports that she has never smoked. She has never used smokeless tobacco. She reports that she does not drink alcohol and does not use drugs.  Family History:  Family History  Problem Relation Age of Onset   Arthritis Mother    Heart disease Mother    Heart attack Father    Throat cancer Sister    Parkinson's disease Sister    COPD Brother    COPD Brother      Prior to Admission medications   Medication Sig Start Date End Date Taking? Authorizing Provider  alendronate (FOSAMAX) 70 MG tablet Take 70 mg by mouth once a week.  11/06/17   [provider]  aspirin 81 MG tablet Take 81 mg by mouth daily.    [provider]  atorvastatin (LIPITOR) 80 MG tablet TAKE 1 TABLET BY MOUTH ONCE DAILY AT  6PM 05/04/21   Loel Dubonnet, NP  fluticasone [facility]) 50 MCG/ACT nasal spray Use 2 spray(s) in each nostril once daily 08/03/20   Einar Pheasant, MD  fluticasone-salmeterol (ADVAIR) 250-50 MCG/ACT AEPB Inhale 1 puff into the lungs in the morning and at bedtime. 06/09/21   Einar Pheasant, MD  folic acid (FOLVITE) 1 MG tablet Take 1 mg by mouth daily. 12/20/21   [provider]  ipratropium-albuterol (DUONEB) 0.5-2.5 (3) MG/3ML SOLN Take 3 mLs by nebulization 2 (two) times daily. 05/17/21   Hosie Poisson, MD  meclizine (ANTIVERT) 25 MG tablet Take 1 tablet (25 mg total) by mouth 3 (three) times daily as needed for dizziness. 10/27/21   Alfred Levins, [place], MD  metFORMIN (GLUCOPHAGE) 500 MG tablet TAKE 1 TABLET BY MOUTH TWICE DAILY WITH A MEAL 12/30/21   Einar Pheasant, MD  methotrexate 2.5 MG tablet Take 12.5 mg by mouth once a week. 12/20/21   [provider]  metoprolol tartrate (LOPRESSOR) 25 MG tablet Take 1 tablet by mouth twice daily 02/28/22   Wellington Hampshire, MD   Multiple Vitamins-Minerals (PRESERVISION AREDS 2) CAPS Take 1 capsule by mouth daily.    [provider]  nitroGLYCERIN (NITROSTAT) 0.4 MG SL tablet Place 1 tablet (0.4 mg total) under the tongue every 5 (five) minutes as needed for chest pain. 10/09/18   Bettey Costa, MD  ondansetron (ZOFRAN-ODT) 4 MG disintegrating tablet Take 1 tablet (4 mg total) by mouth every 8 (eight) hours as needed for nausea or vomiting. 10/27/21   Blake Divine, MD  PROAIR HFA 108 [phone] Base) MCG/ACT inhaler Inhale 2 puffs into the lungs every 4 (four) hours as needed for wheezing or shortness of breath. 08/20/20   Lavina Hamman, MD  Vitamin D, Ergocalciferol, (DRISDOL) 50000 units CAPS capsule Take 50,000 Units by mouth once a week. 06/24/18   [provider]    Physical Exam: Vitals:   04/02/22 1156 04/02/22  1200 04/02/22 1300 04/02/22 1400  BP:  (!) 143/84 (!) 148/83 (!) 162/84  Pulse:  70 75 98  Resp:  _0 Temp:  98.4 F (36.9 C)    TempSrc:  Oral    SpO2:  94% 97% 97%  Weight: 53.5 kg     Height: _1  (1.549 m)      General: Not in acute distress HEENT:       Eyes: PERRL, EOMI, no scleral icterus.       ENT: No discharge from the ears and nose, no pharynx injection, no tonsillar enlargement.        Neck: No JVD, no bruit, no mass felt. Heme: No neck lymph node enlargement. Cardiac: S1/S2, RRR, No murmurs, No gallops or rubs. Respiratory: No rales, wheezing, rhonchi or rubs. GI: Soft, nondistended, nontender, no organomegaly, BS present. GU: No hematuria Ext: No pitting leg edema bilaterally. 1+DP/PT pulse bilaterally. Musculoskeletal: No joint deformities, No joint redness or warmth, no limitation of ROM in spin. Skin: No rashes.  Neuro: Confused, oriented to person and place, not to the time.  Cranial nerves II-XII grossly intact, moves all extremities normally. Psych: Patient is not psychotic, no suicidal or hemocidal ideation.  Has vision hallucination.  Labs on  Admission: I have personally reviewed following labs and imaging studies  CBC: Recent Labs  Lab 04/01/22 1120 04/02/22 1212  WBC 14.8* 17.4*  NEUTROABS 11.7*  --   HGB 12.7 13.7  HCT 38.7 42.0  MCV 90.4 90.5  PLT 280 161   Basic Metabolic Panel: Recent Labs  Lab 04/01/22 1120 04/02/22 1212  NA 139 144  K 3.4* 3.5  CL 102 104  CO2 28 24  GLUCOSE 253* 154*  BUN 13 22  CREATININE 0.68 1.08*  CALCIUM 9.1 9.1   GFR: Estimated Creatinine Clearance: 28.2 mL/min (A) (by C-G formula based on SCr of 1.08 mg/dL (H)). Liver Function Tests: Recent Labs  Lab 04/02/22 1212  AST 41  ALT 33  ALKPHOS 80  BILITOT 0.8  PROT 7.9  ALBUMIN 4.6   No results for input(s): LIPASE, AMYLASE in the last 168 hours. No results for input(s): AMMONIA in the last 168 hours. Coagulation Profile: No results for input(s): INR, PROTIME in the last 168 hours. Cardiac Enzymes: No results for input(s): CKTOTAL, CKMB, CKMBINDEX, TROPONINI in the last 168 hours. BNP (last 3 results) No results for input(s): PROBNP in the last 8760 hours. HbA1C: No results for input(s): HGBA1C in the last 72 hours. CBG: Recent Labs  Lab 04/02/22 1723  GLUCAP 152*   Lipid Profile: No results for input(s): CHOL, HDL, LDLCALC, TRIG, CHOLHDL, LDLDIRECT in the last 72 hours. Thyroid Function Tests: No results for input(s): TSH, T4TOTAL, FREET4, T3FREE, THYROIDAB in the last 72 hours. Anemia Panel: No results for input(s): VITAMINB12, FOLATE, FERRITIN, TIBC, IRON, RETICCTPCT in the last 72 hours. Urine analysis:    Component Value Date/Time   COLORURINE AMBER (A) 04/02/2022 1302   APPEARANCEUR HAZY (A) 04/02/2022 1302   APPEARANCEUR Clear 02/18/2013 2004   LABSPEC 1.041 (H) 04/02/2022 1302   LABSPEC 1.024 02/18/2013 2004   PHURINE 5.0 04/02/2022 1302   GLUCOSEU NEGATIVE 04/02/2022 1302   GLUCOSEU NEGATIVE 01/28/2015 0810   HGBUR NEGATIVE 04/02/2022 1302   BILIRUBINUR NEGATIVE 04/02/2022 1302   BILIRUBINUR  neg 01/16/2015 1437   BILIRUBINUR Negative 02/18/2013 2004   KETONESUR 5 (A) 04/02/2022 1302   PROTEINUR 100 (A) 04/02/2022 1302   UROBILINOGEN 0.2 01/28/2015 0810  NITRITE NEGATIVE 04/02/2022 Central 04/02/2022 1302   LEUKOCYTESUR Trace 02/18/2013 2004   Sepsis Labs: _0 (procalcitonin:4,lacticidven:4) ) Recent Results (from the past 240 hour(s))  SARS Coronavirus 2 by RT PCR (hospital order, performed in [facility] hospital lab) *cepheid single result test* Anterior Nasal Swab     Status: None   Collection Time: 04/02/22  2:01 PM   Specimen: Anterior Nasal Swab  Result Value Ref Range Status   SARS Coronavirus 2 by RT PCR NEGATIVE NEGATIVE Final    Comment: (NOTE) SARS-CoV-2 target nucleic acids are NOT DETECTED.  The SARS-CoV-2 RNA is generally detectable in upper and lower respiratory specimens during the acute phase of infection. The lowest concentration of SARS-CoV-2 viral copies this assay can detect is 250 copies / mL. A negative result does not preclude SARS-CoV-2 infection and should not be used as the sole basis for treatment or other patient management decisions.  A negative result may occur with improper specimen collection / handling, submission of specimen other than nasopharyngeal swab, presence of viral mutation(s) within the areas targeted by this assay, and inadequate number of viral copies (<250 copies / mL). A negative result must be combined with clinical observations, patient history, and epidemiological information.  Fact Sheet for Patients:   https://www.patel.info/  Fact Sheet for Healthcare Providers: https://hall.com/  This test is not yet approved or  cleared by the [place] FDA and has been authorized for detection and/or diagnosis of SARS-CoV-2 by FDA under an Emergency Use Authorization (EUA).  This EUA will remain in effect (meaning this test can be used) for the  duration of the COVID-19 declaration under Section 564(b)(1) of the Act, 21 U.S.C. section 360bbb-3(b)(1), unless the authorization is terminated or revoked sooner.  Performed at [facility], [address] [place], [place] [address]      Radiological Exams on Admission: CT ANGIO HEAD NECK W WO CM  Result Date: 04/01/2022 CLINICAL DATA:  Sudden severe headache EXAM: CT ANGIOGRAPHY HEAD AND NECK TECHNIQUE: Multidetector CT imaging of the head and neck was performed using the standard protocol during bolus administration of intravenous contrast. Multiplanar CT image reconstructions and MIPs were obtained to evaluate the vascular anatomy. Carotid stenosis measurements (when applicable) are obtained utilizing NASCET criteria, using the distal internal carotid diameter as the denominator. RADIATION DOSE REDUCTION: This exam was performed according to the departmental dose-optimization program which includes automated exposure control, adjustment of the mA and/or kV according to patient size and/or use of iterative reconstruction technique. CONTRAST:  30m OMNIPAQUE IOHEXOL 350 MG/ML SOLN COMPARISON:  Same-day noncontrast CT head FINDINGS: CTA NECK FINDINGS Aortic arch: There is mild calcified atherosclerotic plaque in the aortic arch. The origins of the major branch vessels are patent. The subclavian arteries are patent to the level imaged. Right carotid system: The right common carotid artery is patent with predominantly soft plaque resulting in less than 50% stenosis distally. There is mild mixed plaque at the bifurcation resulting in less than 50% stenosis. The distal right internal carotid artery is patent. The right external carotid artery is patent. There is no dissection or aneurysm. Left carotid system: The left common carotid artery is patent. There is mild calcified plaque in the proximal left internal carotid artery resulting in less than 50% stenosis. The distal left internal carotid  artery is patent. The left external carotid artery is patent. There is no dissection or aneurysm. Vertebral arteries: The vertebral arteries are patent, without hemodynamically significant stenosis or occlusion. There  is no dissection or aneurysm. Skeleton: There is degenerative change of the cervical spine most advanced at C5-C6 and C6-C7. There is no acute osseous abnormality or suspicious osseous lesion. Other neck: The soft tissues of the neck are unremarkable. Upper chest: There is a 5 mm nodule in the left apex, not significantly changed since 2021 favoring benign etiology. The imaged lung apices are otherwise clear. Review of the MIP images confirms the above findings CTA HEAD FINDINGS Anterior circulation: There is minimal calcified plaque in the intracranial ICAs without significant stenosis. The bilateral MCAs are patent. Bilateral ACAs are patent. Anterior communicating artery is not definitely seen. There is no aneurysm or AVM. Posterior circulation: The bilateral V4 segments are patent. PICA is identified bilaterally. The basilar artery is patent. The bilateral PCAs are patent. The posterior communicating arteries are not definitely identified There is no aneurysm or AVM. Venous sinuses: Patent. Anatomic variants: None. Review of the MIP images confirms the above findings IMPRESSION: 1. Patent vasculature of the head and neck with mild calcified plaque at the carotid bifurcations but no hemodynamically significant stenosis. No proximal high-grade stenosis or occlusion. 2. No aneurysm identified. Electronically Signed   By: Valetta Mole M.D.   On: 04/01/2022 12:47   DG Chest 2 View  Result Date: 04/02/2022 CLINICAL DATA:  Confusion. EXAM: CHEST - 2 VIEW COMPARISON:  Mar 07, 2022 FINDINGS: Stable mild cardiomegaly. The hila and mediastinum are unchanged. No pneumothorax. Opacity in the lingula, and perhaps the right middle lobe. No other acute abnormalities. IMPRESSION: Lingular opacity concerning for  pneumonia. Opacity in the medial right lung base on the frontal view could represent a right middle lobe opacity versus vascular crowding. Recommend short-term follow-up imaging to ensure resolution. Electronically Signed   By: Dorise Bullion III M.D.   On: 04/02/2022 15:05   CT HEAD WO CONTRAST (5MM)  Result Date: 04/01/2022 CLINICAL DATA:  Headache beginning 24 hours ago. No relief with over-the-counter medications. Hyperglycemia. Hypertension. EXAM: CT HEAD WITHOUT CONTRAST TECHNIQUE: Contiguous axial images were obtained from the base of the skull through the vertex without intravenous contrast. RADIATION DOSE REDUCTION: This exam was performed according to the departmental dose-optimization program which includes automated exposure control, adjustment of the mA and/or kV according to patient size and/or use of iterative reconstruction technique. COMPARISON:  None Available. FINDINGS: Brain: No acute infarct, hemorrhage, or mass lesion is present. Mild generalized white matter hypoattenuation is similar the prior exam. 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 The ventricles are of normal size. No significant extraaxial fluid collection is present. The brainstem and cerebellum are within normal limits. Vascular: Atherosclerotic calcifications are present within the cavernous internal carotid arteries bilaterally. No hyperdense vessel is present. Skull: Calvarium is intact. No focal lytic or blastic lesions are present. No significant extracranial soft tissue lesion is present. Sinuses/Orbits: Mild mucosal thickening is present in the inferior maxillary sinuses bilaterally. Osteoma is again noted in the inferior left frontal sinus. No active sinus disease. Mastoid air cells are clear. Right lens replacement is noted. Globes and orbits are otherwise within normal limits. IMPRESSION: 1. No acute intracranial abnormality or significant interval change. 2. Stable atrophy and white matter disease. 3.  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 Electronically Signed   By: San Morelle M.D.   On: 04/01/2022 12:32   MR BRAIN WO CONTRAST  Result Date: 04/02/2022 CLINICAL DATA:  Mental status change, unknown cause. Visual hallucinations. EXAM: MRI HEAD WITHOUT CONTRAST TECHNIQUE: Multiplanar, multiecho pulse  sequences of the brain and surrounding structures were obtained without intravenous contrast. COMPARISON:  CT studies yesterday. FINDINGS: Brain: Diffusion imaging does not show any acute or subacute infarction. There is generalized age related atrophy. Old small vessel infarctions affect the cerebellum. Mild chronic small-vessel ischemic change of the pons and thalami. Moderate chronic small-vessel ischemic change of the cerebral hemispheric white matter. No mass, hemorrhage, hydrocephalus or extra-axial collection. Vascular: Major vessels at the base of the brain show flow. Skull and upper cervical spine: Negative Sinuses/Orbits: Clear/normal Other: None IMPRESSION: No acute MRI finding. Age related atrophy. 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 Chronic small-vessel ischemic changes of the thalami and hemispheric white matter. Electronically Signed   By: Nelson Chimes M.D.   On: 04/02/2022 16:31      Assessment/Plan Principal Problem:   Acute metabolic encephalopathy Active Problems:   Hypertension   Hypercholesterolemia   Polymyalgia rheumatica syndrome (HCC)   Leukocytosis   Type 2 diabetes mellitus with cardiac complication (HCC)   Chronic diastolic CHF (congestive heart failure) (HCC)   COPD (chronic obstructive pulmonary disease) (HCC)   CAD (coronary artery disease)   PAF (paroxysmal atrial fibrillation) (HCC)   Principal Problem:   Acute metabolic encephalopathy Active Problems:   Hypertension   Hypercholesterolemia   Polymyalgia rheumatica syndrome (HCC)   Leukocytosis   Type 2 diabetes mellitus with cardiac complication (HCC)   Chronic diastolic  CHF (congestive heart failure) (HCC)   COPD (chronic obstructive pulmonary disease) (HCC)   CAD (coronary artery disease)   PAF (paroxysmal atrial fibrillation) (HCC)   Assessment and Plan: * Acute metabolic encephalopathy Etiology is not clear.  Urinalysis negative.  VBG showed CO2 38.  MRI of the brain is negative for acute issues.  Differential diagnosis includes delirium, side effects of steroid, early dementia with behavior change and psychosis.   -Placed on telemetry bed for observation -Frequent neurocheck -Consulted Gust Rung of psychiatry NP -check V12 and TSH  PAF (paroxysmal atrial fibrillation) ([place]) Per patient's daughter, patient has history of atrial fibrillation, not taking anticoagulants currently.  Today EKG showed A-fib.  Currently heart rate 70-90s -on metoprolol   CAD (coronary artery disease) S/p of stent placement. No chest pain. -Aspirin  COPD (chronic obstructive pulmonary disease) (HCC) Stable -Bronchodilators  Chronic diastolic CHF (congestive heart failure) ([place]) 2D echo on 11/01/2021 showed EF of 60 to 65%.  Patient does not have leg edema or JVD.  CHF is compensated. -Check BNP  Type 2 diabetes mellitus with cardiac complication (HCC) - Recent A1c 8.0, poorly controlled.  Patient is taking metformin at home -Sliding scale insulin  Leukocytosis WBC 17.1.  No signs of infection.  No fever.  Possibly due to steroid use -Follow-up with CBC  Polymyalgia rheumatica syndrome ([place]) - Patient is not taking methotrexate per her daughter - 15 mg of prednisone daily (daughter is not sure if patient is taking 10 or 15 mg of prednisone daily currently    Hypercholesterolemia - Patient stopped taking Lipitor per her daughter  Hypertension - IV hydralazine as needed -Metoprolol              DVT ppx:  SQ Lovenox  Code Status: DNR per her daughter  Family Communication:     Yes, patient's daughter   at bed side.     Disposition Plan:   Anticipate discharge back to previous environment  Consults called: Gust Rung of psychiatry  Admission status and Level of care: Telemetry Medical:    for obs  Severity of Illness:  The appropriate patient status for this patient is OBSERVATION. Observation status is judged to be reasonable and necessary in order to provide the required intensity of service to ensure the patient's safety. The patient's presenting symptoms, physical exam findings, and initial radiographic and laboratory data in the context of their medical condition is felt to place them at decreased risk for further clinical deterioration. Furthermore, it is anticipated that the patient will be medically stable for discharge from the hospital within 2 midnights of admission.        Date of Service 04/02/2022    Ivor Costa [facility]   If 7PM-7AM, please contact night-coverage [email or web address] 04/02/2022, 6:33 PM

## 2022-04-02 NOTE — Progress Notes (Signed)
Pt screaming crying having hallucination with anxiety. Stating "someone is in her room going to her hurt me " there are live fish all over the floor. Notified MD MD Brien Few

## 2022-04-02 NOTE — Assessment & Plan Note (Addendum)
Continue home metoprolol IV hydralazine as needed  6/5: BP's elevated, increased metoprolol 25 >> 50 mg BID

## 2022-04-02 NOTE — Assessment & Plan Note (Addendum)
Echo on 11/01/2021 - EF 60 to 65%.   Patient appears euvolemic and well compensated. --Monitor volume status -- Continue metoprolol -- Appears not on diuretics

## 2022-04-02 NOTE — ED Provider Notes (Addendum)
[facility] Provider Note    Event Date/Time   First MD Initiated Contact with Patient 04/02/22 1224     (approximate)   History   Altered Mental Status and Hallucinations   HPI  Tanya Cole is a 88 y.o. female with past medical history of PMR, CAD, heart failure with reduced EF, diabetes, here with altered mental status.  The patient was actually seen yesterday in the ED for headache.  She has polymyalgia and was worked up extensively for this with negative imaging as well as inflammatory markers.  She felt much better and went home.  Reportedly, that evening, she began to get more confused.  This morning, she had moved her furniture and was actively, visually hallucinating about other people in the home.  She did not, however, complain of any headache.  She has been disoriented since then.  To me, she does remember some of this and states she has "never been like this before."  Denies any fever, chills.  She does have a recent UTI and has been having urinary frequency and incontinence.  No flank pain.  No neck pain.  No meningismus.     Physical Exam   Triage Vital Signs: ED Triage Vitals  Enc Vitals Group     BP 04/02/22 1200 (!) 143/84     Pulse Rate 04/02/22 1200 70     Resp 04/02/22 1200 18     Temp 04/02/22 1200 98.4 F (36.9 C)     Temp Source 04/02/22 1200 Oral     SpO2 04/02/22 1200 94 %     Weight 04/02/22 1156 118 lb (53.5 kg)     Height 04/02/22 1156  (1.549 m)     Head Circumference --      Peak Flow --      Pain Score 04/02/22 1156 0     Pain Loc --      Pain Edu? --      Excl. in GC? --     Most recent vital signs: Vitals:   04/02/22 1300 04/02/22 1400  BP: (!) 148/83 (!) 162/84  Pulse: 75 98  Resp: 18 18  Temp:    SpO2: 97% 97%     General: Awake, no distress.  Oriented to person, place, but not time. CV:  Good peripheral perfusion.  No murmurs Resp:  Normal effort.  Lungs clear to auscultation  bilaterally. Abd:  No distention.  No tenderness Other:  Oriented as above.  Cranial nerves II through XII intact.  Strength out of 5 bilateral upper and lower extremities.  Normal sensation light touch.   ED Results / Procedures / Treatments   Labs (all labs ordered are listed, but only abnormal results are displayed) Labs Reviewed  COMPREHENSIVE METABOLIC PANEL - Abnormal; Notable for the following components:      Result Value   Glucose, Bld 154 (*)    Creatinine, Ser 1.08 (*)    GFR, Estimated 50 (*)    Anion gap 16 (*)    All other components within normal limits  CBC - Abnormal; Notable for the following components:   WBC 17.4 (*)    All other components within normal limits  URINALYSIS, ROUTINE W REFLEX MICROSCOPIC - Abnormal; Notable for the following components:   Color, Urine AMBER (*)    APPearance HAZY (*)    Specific Gravity, Urine 1.041 (*)    Ketones, ur 5 (*)    Protein, ur 100 (*)  All other components within normal limits  BLOOD GAS, VENOUS - Abnormal; Notable for the following components:   pCO2, Ven 38 (*)    pO2, Ven 75 (*)    All other components within normal limits  SARS CORONAVIRUS 2 BY RT PCR  URINE DRUG SCREEN, QUALITATIVE ([facility] ONLY)  CBG MONITORING, ED     EKG Afib RVR< VR 120. Nonspecific ST changes, with st depressions inferolaterally.   RADIOLOGY CXR: Pending MR Brain: Pending   I also independently reviewed and agree with radiologist interpretations.   PROCEDURES:  Critical Care performed: No   MEDICATIONS ORDERED IN ED: Medications  ondansetron (ZOFRAN) injection 4 mg (has no administration in time range)  acetaminophen (TYLENOL) tablet 650 mg (has no administration in time range)  hydrALAZINE (APRESOLINE) injection 5 mg (has no administration in time range)  insulin aspart (novoLOG) injection 0-9 Units (has no administration in time range)  insulin aspart (novoLOG) injection 0-5 Units (has no administration in time  range)  enoxaparin (LOVENOX) injection 30 mg (has no administration in time range)  sodium chloride 0.9 % bolus 1,000 mL (1,000 mLs Intravenous New Bag/Given 04/02/22 1401)     IMPRESSION / MDM / ASSESSMENT AND PLAN / ED COURSE  I reviewed the triage vital signs and the nursing notes.                               The patient is on the cardiac monitor to evaluate for evidence of arrhythmia and/or significant heart rate changes.   Ddx:  Differential includes the following, with pertinent life- or limb-threatening emergencies considered:  Acute encephalopathy/delirium due to occult infection, polypharmacy, CVA, CNS mass/lesion  Patient's presentation is most consistent with acute complicated illness / injury requiring diagnostic workup.  MDM:  88 yo F with PMHx HTN, CAD, CHF, HLD, PMR, here with acute confusion. Suspect acute metabolic encephalopathy, possibly from occult infection versus polypharmacy and reglan/morphine yesterday, though less likely. UA negative for UTI. CXR pending but may show lingular PNA, which would explain sx. CBC with leukocytosis, unclear significance as she is reportedly on chronic steroids. UA with mild ketonuria, and she does appear dehydrated clinically. Will give IVF, admit for further work-up. No ongoing HA, pt does not appear toxic, doubt meningitis/encephalitis at this time.   MEDICATIONS GIVEN IN ED: Medications  ondansetron (ZOFRAN) injection 4 mg (has no administration in time range)  acetaminophen (TYLENOL) tablet 650 mg (has no administration in time range)  hydrALAZINE (APRESOLINE) injection 5 mg (has no administration in time range)  insulin aspart (novoLOG) injection 0-9 Units (has no administration in time range)  insulin aspart (novoLOG) injection 0-5 Units (has no administration in time range)  enoxaparin (LOVENOX) injection 30 mg (has no administration in time range)  sodium chloride 0.9 % bolus 1,000 mL (1,000 mLs Intravenous New Bag/Given  04/02/22 1401)     Consults:  Hospitalist consulted for admission.   EMR reviewed  Office visit with Dr. Kirke CorinArida 5/30 reviewed, ED visit yesterday     FINAL CLINICAL IMPRESSION(S) / ED DIAGNOSES   Final diagnoses:  Acute encephalopathy     Rx / DC Orders   ED Discharge Orders     None        Note:  This document was prepared using Dragon voice recognition software and may include unintentional dictation errors.   Shaune PollackIsaacs, Rutilio Yellowhair, MD 04/02/22 1652    Shaune PollackIsaacs, Abbygael Curtiss, MD 04/02/22 [phone]

## 2022-04-02 NOTE — ED Triage Notes (Signed)
Pt in via EMS from home with c/o AMS. Pt was here yesterday for a HA and discharged. Family reports pt more disoriented today and hallucinating and the family reports they are having a hard time taking care of her.

## 2022-04-02 NOTE — Assessment & Plan Note (Addendum)
Daughter reported history of atrial fibrillation, not on anticoagulation.   EKG on admission showed A-fib.   Heart rates are controlled. -stopped telemetry as it is significantly worsening her agitation --Continue metoprolol - increased from 25 --> to 50 mg bid to improve rate control and BP

## 2022-04-02 NOTE — Assessment & Plan Note (Addendum)
Stable ?Continue bronchodilators ?

## 2022-04-02 NOTE — Assessment & Plan Note (Addendum)
Daughter reported patient stopped taking Lipitor.  Previously on Lipitor 80 mg.

## 2022-04-02 NOTE — Assessment & Plan Note (Addendum)
Follows with rheumatology for this and RA, Dr. Allena Katz.  Note reviewed from follow-up visit on 02/10/2022 when prednisone had been decreased to 2.5 mg daily, methotrexate 12.5 mg weekly was continued along with folic acid 1 mg daily.  Admitting hospitalist was told by daughter that she was not sure if patient was taking 10 or 15 mg prednisone, and patient not taking methotrexate. -- Reduce prednisone to 2.5 mg daily -- Close follow-up with rheumatology -- Will advised to continue methotrexate or discuss with rheumatologist

## 2022-04-02 NOTE — ED Triage Notes (Signed)
Pt via POV from home. Pt was seen and discharged yesterday for a headache. Pt received a migraine cocktail and CT scans were negative. Denies any recent falls. Pt states that this AM she started have visual hallucinations. Denies auditory hallucinations. Denies any SI/HI. Pt states she also feels nauseous. Pt states that they started this AM. Denies any urinary symptoms. Pt just finished medications 2 weeks ago for UTI. Pt is A&Ox4 and NAD

## 2022-04-02 NOTE — Assessment & Plan Note (Addendum)
-   Recent A1c 8.0, poorly controlled.   --Hold metformin --Sliding scale NovoLog

## 2022-04-02 NOTE — Assessment & Plan Note (Addendum)
Etiology is not clear.  Urinalysis negative.  VBG showed CO2 38.  MRI of the brain is negative for acute issues.  Differential diagnosis includes delirium, side effects of steroid, early dementia with behavior change and psychosis.  Vitamin B12 and TSH are normal. Chest x-ray on admission did show a lingular opacity concerning for pneumonia, opacity in the medial right lung base --Will start coverage for pneumonia --Delirium precautions --Neuro checks --Psychiatry consulted --Seroquel has been started at bedtime and as needed during the day --Psych recommends Zyprexa if Seroquel is ineffective --As needed IV Haldol if agitation despite Seroquel

## 2022-04-02 NOTE — Assessment & Plan Note (Addendum)
Stable no active chest pain. Status post stent placement. Continue aspirin

## 2022-04-02 NOTE — Assessment & Plan Note (Addendum)
WBC 17.1 on admission.  No signs or symptoms of infection, afebrile.   Suspect due to home prednisone. --Monitor CBC -- Monitor clinically for signs or symptoms of infection

## 2022-04-02 NOTE — ED Notes (Signed)
Patient at MRI 

## 2022-04-03 DIAGNOSIS — J440 Chronic obstructive pulmonary disease with acute lower respiratory infection: Secondary | ICD-10-CM | POA: Diagnosis present

## 2022-04-03 DIAGNOSIS — Z8616 Personal history of COVID-19: Secondary | ICD-10-CM | POA: Diagnosis not present

## 2022-04-03 DIAGNOSIS — Z7952 Long term (current) use of systemic steroids: Secondary | ICD-10-CM | POA: Diagnosis not present

## 2022-04-03 DIAGNOSIS — Y92009 Unspecified place in unspecified non-institutional (private) residence as the place of occurrence of the external cause: Secondary | ICD-10-CM | POA: Diagnosis not present

## 2022-04-03 DIAGNOSIS — E1169 Type 2 diabetes mellitus with other specified complication: Secondary | ICD-10-CM | POA: Diagnosis present

## 2022-04-03 DIAGNOSIS — Z66 Do not resuscitate: Secondary | ICD-10-CM | POA: Diagnosis present

## 2022-04-03 DIAGNOSIS — F19951 Other psychoactive substance use, unspecified with psychoactive substance-induced psychotic disorder with hallucinations: Secondary | ICD-10-CM | POA: Diagnosis not present

## 2022-04-03 DIAGNOSIS — T380X5A Adverse effect of glucocorticoids and synthetic analogues, initial encounter: Secondary | ICD-10-CM | POA: Diagnosis present

## 2022-04-03 DIAGNOSIS — I2510 Atherosclerotic heart disease of native coronary artery without angina pectoris: Secondary | ICD-10-CM | POA: Diagnosis present

## 2022-04-03 DIAGNOSIS — Z808 Family history of malignant neoplasm of other organs or systems: Secondary | ICD-10-CM | POA: Diagnosis not present

## 2022-04-03 DIAGNOSIS — E860 Dehydration: Secondary | ICD-10-CM | POA: Diagnosis present

## 2022-04-03 DIAGNOSIS — J189 Pneumonia, unspecified organism: Secondary | ICD-10-CM | POA: Diagnosis present

## 2022-04-03 DIAGNOSIS — G9341 Metabolic encephalopathy: Secondary | ICD-10-CM | POA: Diagnosis present

## 2022-04-03 DIAGNOSIS — Z7984 Long term (current) use of oral hypoglycemic drugs: Secondary | ICD-10-CM | POA: Diagnosis not present

## 2022-04-03 DIAGNOSIS — E1140 Type 2 diabetes mellitus with diabetic neuropathy, unspecified: Secondary | ICD-10-CM | POA: Diagnosis present

## 2022-04-03 DIAGNOSIS — I110 Hypertensive heart disease with heart failure: Secondary | ICD-10-CM | POA: Diagnosis present

## 2022-04-03 DIAGNOSIS — F03A2 Unspecified dementia, mild, with psychotic disturbance: Secondary | ICD-10-CM | POA: Diagnosis present

## 2022-04-03 DIAGNOSIS — G9340 Encephalopathy, unspecified: Secondary | ICD-10-CM | POA: Diagnosis present

## 2022-04-03 DIAGNOSIS — E876 Hypokalemia: Secondary | ICD-10-CM | POA: Diagnosis present

## 2022-04-03 DIAGNOSIS — D72829 Elevated white blood cell count, unspecified: Secondary | ICD-10-CM | POA: Diagnosis present

## 2022-04-03 DIAGNOSIS — I252 Old myocardial infarction: Secondary | ICD-10-CM | POA: Diagnosis not present

## 2022-04-03 DIAGNOSIS — Z955 Presence of coronary angioplasty implant and graft: Secondary | ICD-10-CM | POA: Diagnosis not present

## 2022-04-03 DIAGNOSIS — I5042 Chronic combined systolic (congestive) and diastolic (congestive) heart failure: Secondary | ICD-10-CM | POA: Diagnosis present

## 2022-04-03 DIAGNOSIS — M353 Polymyalgia rheumatica: Secondary | ICD-10-CM | POA: Diagnosis present

## 2022-04-03 DIAGNOSIS — E7800 Pure hypercholesterolemia, unspecified: Secondary | ICD-10-CM | POA: Diagnosis present

## 2022-04-03 DIAGNOSIS — I480 Paroxysmal atrial fibrillation: Secondary | ICD-10-CM | POA: Diagnosis present

## 2022-04-03 LAB — BASIC METABOLIC PANEL
Anion gap: 12 (ref 5–15)
BUN: 13 mg/dL (ref 8–23)
CO2: 22 mmol/L (ref 22–32)
Calcium: 8.5 mg/dL — ABNORMAL LOW (ref 8.9–10.3)
Chloride: 105 mmol/L (ref 98–111)
Creatinine, Ser: 0.55 mg/dL (ref 0.44–1.00)
GFR, Estimated: >60 mL/min (ref >60)
Glucose, Bld: 149 mg/dL — ABNORMAL HIGH (ref 70–99)
Potassium: 3.1 mmol/L — ABNORMAL LOW (ref 3.5–5.1)
Sodium: 139 mmol/L (ref 135–145)

## 2022-04-03 LAB — CBC
HCT: 38.1 % (ref 36.0–46.0)
Hemoglobin: 12.6 g/dL (ref 12.0–15.0)
MCH: 29.4 pg (ref 26.0–34.0)
MCHC: 33.1 g/dL (ref 30.0–36.0)
MCV: 88.8 fL (ref 80.0–100.0)
Platelets: 274 10*3/uL (ref 150–400)
RBC: 4.29 MIL/uL (ref 3.87–5.11)
RDW: 13.8 % (ref 11.5–15.5)
WBC: 13.5 10*3/uL — ABNORMAL HIGH (ref 4.0–10.5)
nRBC: 0 % (ref 0.0–0.2)

## 2022-04-03 LAB — GLUCOSE, CAPILLARY
Glucose-Capillary: 157 mg/dL — ABNORMAL HIGH (ref 70–99)
Glucose-Capillary: 261 mg/dL — ABNORMAL HIGH (ref 70–99)
Glucose-Capillary: 208 mg/dL — ABNORMAL HIGH (ref 70–99)
Glucose-Capillary: 166 mg/dL — ABNORMAL HIGH (ref 70–99)

## 2022-04-03 LAB — VITAMIN B12: Vitamin B-12: 400 pg/mL (ref 180–914)

## 2022-04-03 MED ORDER — HALOPERIDOL LACTATE 5 MG/ML IJ SOLN: 1.0000 mg | Freq: Four times a day (QID) | INTRAMUSCULAR | Status: DC | PRN

## 2022-04-03 MED ORDER — QUETIAPINE FUMARATE 25 MG PO TABS
12.5000 mg | ORAL_TABLET | Freq: Two times a day (BID) | ORAL | Status: DC | PRN
  Administered 2022-04-03 – 2022-04-05 (×2): 12.5 mg via ORAL
  Filled 2022-04-03 (×2): qty 1

## 2022-04-03 MED ORDER — SODIUM CHLORIDE 0.9 % IV SOLN
1.0000 g | INTRAVENOUS | Status: DC
  Administered 2022-04-03 – 2022-04-04 (×2): 1 g via INTRAVENOUS
  Filled 2022-04-03 (×2): qty 1
  Filled 2022-04-03: qty 10

## 2022-04-03 MED ORDER — PREDNISONE 10 MG PO TABS: 10.0000 mg | ORAL_TABLET | Freq: Every day | ORAL | Status: DC

## 2022-04-03 MED ORDER — POTASSIUM CHLORIDE CRYS ER 20 MEQ PO TBCR
40.0000 meq | EXTENDED_RELEASE_TABLET | Freq: Once | ORAL | Status: AC
  Administered 2022-04-03: 40 meq via ORAL

## 2022-04-03 MED ORDER — ENOXAPARIN SODIUM 40 MG/0.4ML IJ SOSY
40.0000 mg | PREFILLED_SYRINGE | INTRAMUSCULAR | Status: DC
  Administered 2022-04-03 – 2022-04-05 (×3): 40 mg via SUBCUTANEOUS
  Filled 2022-04-03 (×3): qty 0.4

## 2022-04-03 MED ORDER — PREDNISONE 2.5 MG PO TABS
2.5000 mg | ORAL_TABLET | Freq: Every day | ORAL | Status: DC
  Administered 2022-04-04 – 2022-04-06 (×3): 2.5 mg via ORAL
  Filled 2022-04-03 (×3): qty 1

## 2022-04-03 MED ORDER — SODIUM CHLORIDE 0.9 % IV SOLN
500.0000 mg | INTRAVENOUS | Status: DC
  Administered 2022-04-03 – 2022-04-04 (×2): 500 mg via INTRAVENOUS
  Filled 2022-04-03 (×2): qty 500
  Filled 2022-04-03: qty 5

## 2022-04-03 MED ORDER — QUETIAPINE FUMARATE 25 MG PO TABS
25.0000 mg | ORAL_TABLET | Freq: Every day | ORAL | Status: DC
  Administered 2022-04-03 – 2022-04-04 (×2): 25 mg via ORAL
  Filled 2022-04-03 (×2): qty 1

## 2022-04-03 NOTE — Assessment & Plan Note (Signed)
resolved Monitor BMP, replace K as needed. Monitor Mg level as well.

## 2022-04-03 NOTE — Progress Notes (Signed)
Chaplain responded to request for pt support; pt has altered mental  status, and was tearful and agitated.  When asked where the sadness was coming from, pt stated she didn't know.  Chaplain stepped out during brief pt care, and then was called away; upon return, pt was sleepy.  Chaplain encouraged pt to rest, offered brief prayer.  Oriented pt to how to request another visit.   Tanya Cole, [place] [phone]    04/03/22 1206  Clinical Encounter Type  Visited With Patient  Visit Type Initial;Spiritual support;Psychological support  Referral From Nurse  Consult/Referral To Chaplain  Spiritual Encounters  Spiritual Needs Emotional;Prayer  Stress Factors  Patient Stress Factors Health changes

## 2022-04-03 NOTE — Progress Notes (Signed)
Pt yelling out most of day. Impulsive at times. Very confused and agitated at times. Calling out for Anderson Malta and talking to and about people that she sees in room that are not there. Has only slept approx. Two hours today. PRN seroquel given earlier in shift with little change. MD aware

## 2022-04-03 NOTE — Hospital Course (Signed)
88 y.o. female who lives alone at home with medical history significant of CAD, dHCF, HTN, HLD, COPD, PMR, who presents with agitation, altered mental status and vision hallucinations, with family reporting worsening confusion and combativeness over past several days.    Admitted for further evaluation of acute encephalopathy.

## 2022-04-03 NOTE — Plan of Care (Signed)

## 2022-04-03 NOTE — Evaluation (Signed)
Physical Therapy Evaluation Patient Details Name: Tanya Cole MRN: [id number] DOB: April 08, 1933 Today's Date: 04/03/2022  History of Present Illness  [place] Mae Siegenthaler is a 88 y.o. female with medical history significant of CAD, dHCF, HTN, HLD, COPD, PMR, who presents with AMS and vision hallucination   Clinical Impression  Patient received in bed, had just been up to [facility] with NT. Patient is agreeable to PT assessment. She is mod independent with bed mobility and transfers, although requires cues for safety. Able to ambulate 80 feet with RW and min assist. She is generally steady, but needs directional and safety assist due to cognitive limitations and would require 24 hour supervision at this time. She has difficulty avoiding obstacles in path during mobility. Patient will continue to benefit from skilled PT to improve safety with mobility.        Recommendations for follow up therapy are one component of a multi-disciplinary discharge planning process, led by the attending physician.  Recommendations may be updated based on patient status, additional functional criteria and insurance authorization.  Follow Up Recommendations Home health PT    Assistance Recommended at Discharge Frequent or constant Supervision/Assistance  Patient can return home with the following  A little help with walking and/or transfers;A little help with bathing/dressing/bathroom;Assist for transportation;Help with stairs or ramp for entrance;Assistance with cooking/housework;Direct supervision/assist for financial management;Direct supervision/assist for medications management    Equipment Recommendations None recommended by PT  Recommendations for Other Services       Functional Status Assessment Patient has had a recent decline in their functional status and demonstrates the ability to make significant improvements in function in a reasonable and predictable amount of time.     Precautions / Restrictions  Precautions Precautions: Fall Restrictions Weight Bearing Restrictions: No      Mobility  Bed Mobility Overal bed mobility: Modified Independent                  Transfers Overall transfer level: Modified independent Equipment used: Rolling walker (2 wheels)                    Ambulation/Gait Ambulation/Gait assistance: Min assist Gait Distance (Feet): 80 Feet Assistive device: Rolling walker (2 wheels) Gait Pattern/deviations: Step-through pattern, Decreased step length - right, Decreased step length - left, Drifts right/left Gait velocity: decr     General Gait Details: requires cues/assist to avoid obstacles in path, directional cues  Stairs            Wheelchair Mobility    Modified Rankin (Stroke Patients Only)       Balance Overall balance assessment: Needs assistance, History of Falls Sitting-balance support: Feet supported Sitting balance-Leahy Scale: Fair     Standing balance support: Bilateral upper extremity supported, During functional activity, Reliant on assistive device for balance Standing balance-Leahy Scale: Fair                               Pertinent Vitals/Pain Pain Assessment Pain Assessment: No/denies pain    Home Living Family/patient expects to be discharged to:: Private residence Living Arrangements: Alone Available Help at Discharge: Available PRN/intermittently;Family Type of Home: House Home Access: Stairs to enter Entrance Stairs-Rails: None Entrance Stairs-Number of Steps: 2   Home Layout: One level Home Equipment: Standard Walker;BSC/3in1;Cane - single point;Grab bars - tub/shower Additional Comments: home set up taken from prior admission( 5 mo ago) as patient confused    Prior Function  Prior Level of Function : Patient poor historian/Family not available             Mobility Comments: Unsure ADLs Comments: unsure     Hand Dominance   Dominant Hand: Right    Extremity/Trunk  Assessment   Upper Extremity Assessment Upper Extremity Assessment: Defer to OT evaluation    Lower Extremity Assessment Lower Extremity Assessment: Generalized weakness    Cervical / Trunk Assessment Cervical / Trunk Assessment: Normal  Communication   Communication: No difficulties  Cognition Arousal/Alertness: Awake/alert Behavior During Therapy: WFL for tasks assessed/performed Overall Cognitive Status: Impaired/Different from baseline Area of Impairment: Orientation, Safety/judgement, Awareness, Memory, Problem solving                 Orientation Level: Disoriented to, Place, Time, Situation   Memory: Decreased short-term memory   Safety/Judgement: Decreased awareness of safety, Decreased awareness of deficits Awareness: Intellectual Problem Solving: Requires verbal cues, Requires tactile cues General Comments: per nursing- patient trying to get out of bed all day. Seeing things.        General Comments      Exercises     Assessment/Plan    PT Assessment Patient needs continued PT services  PT Problem List Decreased strength;Decreased mobility;Decreased safety awareness;Decreased activity tolerance;Decreased balance;Decreased cognition;Decreased knowledge of precautions       PT Treatment Interventions Gait training;Stair training;Functional mobility training;Therapeutic activities;Patient/family education;Cognitive remediation;Balance training;Therapeutic exercise;DME instruction    PT Goals (Current goals can be found in the Care Plan section)  Acute Rehab PT Goals Patient Stated Goal: none stated, no family present PT Goal Formulation: Patient unable to participate in goal setting Time For Goal Achievement: 04/17/22    Frequency Min 2X/week     Co-evaluation               AM-PAC PT "6 Clicks" Mobility  Outcome Measure Help needed turning from your back to your side while in a flat bed without using bedrails?: None Help needed moving from  lying on your back to sitting on the side of a flat bed without using bedrails?: None Help needed moving to and from a bed to a chair (including a wheelchair)?: A Little Help needed standing up from a chair using your arms (e.g., wheelchair or bedside chair)?: A Little Help needed to walk in hospital room?: A Little Help needed climbing 3-5 steps with a railing? : A Little 6 Click Score: 20    End of Session Equipment Utilized During Treatment: Gait belt Activity Tolerance: Patient limited by fatigue Patient left: in bed;with bed alarm set Nurse Communication: Mobility status PT Visit Diagnosis: Unsteadiness on feet (R26.81);Other abnormalities of gait and mobility (R26.89);Muscle weakness (generalized) (M62.81);Difficulty in walking, not elsewhere classified (R26.2)    Time: 1430-1440 PT Time Calculation (min) (ACUTE ONLY): 10 min   Charges:   PT Evaluation $PT Eval Low Complexity: 1 Low          Evertt Chouinard, PT, GCS 04/03/22,2:54 PM

## 2022-04-03 NOTE — Progress Notes (Signed)
PHARMACIST - PHYSICIAN COMMUNICATION  CONCERNING:  Enoxaparin (Lovenox) for DVT Prophylaxis    RECOMMENDATION: Patient was prescribed enoxaparin 30mg  q24 hours for VTE prophylaxis.   Filed Weights   04/02/22 1156  Weight: 53.5 kg (118 lb)    Body mass index is 22.3 kg/m.  Estimated Creatinine Clearance: 38.1 mL/min (by C-G formula based on SCr of 0.55 mg/dL).   Patient is candidate for enoxaparin 40mg  every 24 hours based on CrCl >93ml/min and Weight >45kg  DESCRIPTION: Pharmacy has adjusted enoxaparin dose per [facility] policy.  Patient is now receiving enoxaparin 40 mg every 24 hours   Tressie Ellis 04/03/2022 8:25 AM

## 2022-04-03 NOTE — Assessment & Plan Note (Signed)
Patient likely has mild dementia, has been on prednisone for quite some time for RA and polymyalgia rheumatica which can lead to psychosis esp in elderly with cognitive decline.  It seems there may be confusion with her current dose as daughter on admission reported to the admitting hospitalist that she was not sure whether patient should be taking 10 or 15 mg.  On chart review of last rheumatology follow-up 4/13, prednisone was reduced from 5 down to 2.5 mg. -- Continue prednisone 2.5 mg daily with breakfast -- Consider stopping prednisone if possible

## 2022-04-03 NOTE — Assessment & Plan Note (Signed)
Patient presented with altered mental status and hallucinations, family reported worsening confusion over several days.  Patient is poor historian but no mention of respiratory symptoms by family. Chest x-ray on admission showed a lingular opacity concerning for pneumonia, medial right lung base opacity as well. This could be etiology of patient's encephalopathy. -- Started IV Rocephin and Zithromax  -- Transition to oral Augmentin and Zithromax today  Patient's mental status drastically improved over the last 48 hours.  Now at baseline.

## 2022-04-03 NOTE — Progress Notes (Signed)
OT Cancellation Note  Patient Details Name: Tanya Cole MRN: [id number] DOB: 10-30-1933   Cancelled Treatment:    Reason Eval/Treat Not Completed: Fatigue/lethargy limiting ability to participate  Thank you for OT consult.  Per nursing, pt recently fell asleep after restlessness/agitation today.  Nursing advised to allow pt to continue resting at this time.  Will continue to follow up at next opportunity.  Dennison Nancy, OTR/L 04/03/22, 2:54 PM

## 2022-04-03 NOTE — Progress Notes (Addendum)
Progress Note   Patient: Tanya AskewVirginia Mae Walthall ZOX:[id number] DOB: [birth year] DOA: 04/02/2022     0 DOS: the patient was seen and examined on 04/03/2022   Brief hospital course: 88 y.o. female who lives alone at home with medical history significant of CAD, dHCF, HTN, HLD, COPD, PMR, who presents with agitation, altered mental status and vision hallucinations, with family reporting worsening confusion and combativeness over past several days.    Admitted for further evaluation of acute encephalopathy.    Assessment and Plan: * Acute metabolic encephalopathy Etiology is not clear.  Urinalysis negative.  VBG showed CO2 38.  MRI of the brain is negative for acute issues.  Differential diagnosis includes delirium, side effects of steroid, early dementia with behavior change and psychosis.  Vitamin B12 and TSH are normal. Chest x-ray on admission did show a lingular opacity concerning for pneumonia, opacity in the medial right lung base --Will start coverage for pneumonia --Delirium precautions --Neuro checks --Psychiatry consulted --Seroquel has been started at bedtime and as needed during the day --As needed IV Haldol if agitation despite Seroquel  CAP (community acquired pneumonia) Patient presented with altered mental status and hallucinations, family reported worsening confusion over several days.  Patient is poor historian but no mention of respiratory symptoms by family. Chest x-ray on admission showed a lingular opacity concerning for pneumonia, medial right lung base opacity as well. This could be etiology of patient's encephalopathy. -- Start IV Rocephin and Zithromax -- Check procalcitonin with a.m. labs  Steroid-induced psychosis, with hallucinations (HCC) Patient likely has mild dementia, has been on prednisone for quite some time for RA and polymyalgia rheumatica which can lead to psychosis esp in elderly with cognitive decline.  It seems there may be confusion with her current dose  as daughter on admission reported to the admitting hospitalist that she was not sure whether patient should be taking 10 or 15 mg.  On chart review of last rheumatology follow-up 4/13, prednisone was reduced from 5 down to 2.5 mg. -- Continue prednisone 2.5 mg daily with breakfast -- Consider stopping prednisone if possible  Hypokalemia Potassium 3.1 this morning (6/4). Replace with 40 mEq oral KCl. Monitor BMP, replace K as needed. Monitor Mg level as well.  Polymyalgia rheumatica syndrome (HCC) Follows with rheumatology for this and RA, Dr. Allena KatzPatel.  Note reviewed from follow-up visit on 02/10/2022 when prednisone had been decreased to 2.5 mg daily, methotrexate 12.5 mg weekly was continued along with folic acid 1 mg daily.  Admitting hospitalist was told by daughter that she was not sure if patient was taking 10 or 15 mg prednisone, and patient not taking methotrexate. -- Reduce prednisone to 2.5 mg daily -- Close follow-up with rheumatology -- Will advised to continue methotrexate or discuss with rheumatologist   PAF (paroxysmal atrial fibrillation) [facility](HCC) Daughter reported history of atrial fibrillation, not on anticoagulation.   EKG on admission showed A-fib.   Heart rates are controlled. --Will discontinue telemetry as it is significantly worsening her agitation --Continue metoprolol   CAD (coronary artery disease) Stable no active chest pain. Status post stent placement. Continue aspirin  COPD (chronic obstructive pulmonary disease) (HCC) Stable -- Continue bronchodilators  Chronic diastolic CHF (congestive heart failure) (HCC) Echo on 11/01/2021 - EF 60 to 65%.   Patient appears euvolemic and well compensated. --Monitor volume status -- Continue metoprolol -- Appears not on diuretics  Type 2 diabetes mellitus with cardiac complication (HCC) - Recent A1c 8.0, poorly controlled.   --Hold metformin --Sliding scale  NovoLog  Leukocytosis WBC 17.1 on admission.  No signs  or symptoms of infection, afebrile.   Suspect due to home prednisone. --Monitor CBC -- Monitor clinically for signs or symptoms of infection  Hypercholesterolemia Daughter reported patient stopped taking Lipitor.  Previously on Lipitor 80 mg.  Hypertension Continue home metoprolol IV hydralazine as needed         Subjective: Patient was resting comfortably in bed when I saw her on rounds today.  I woke her up to check on her and she became a bit restless, started messing with her IV, was difficult to redirect.  Bedside nurses report significant agitation and combativeness despite Seroquel that was given this morning.  Patient is confused but denies any acute complaints including pain or feeling sick.  Physical Exam: Vitals:   04/03/22 0008 04/03/22 0751 04/03/22 1152 04/03/22 1539  BP: (!) 158/64 (!) 152/66 122/60 (!) 154/72  Pulse: 89 98 91 96  Resp: Temp:  98.3 F (36.8 C) 98 F (36.7 C) 98.3 F (36.8 C)  TempSrc:   Oral   SpO2: 97% 97% 95% 95%  Weight:      Height:       General exam: Resting comfortably, woke easily and became restless, no acute distress HEENT: moist mucus membranes, hearing grossly normal  Respiratory system: CTAB, no wheezes, rales or rhonchi, normal respiratory effort. Cardiovascular system: normal S1/S2, RRR, systolic murmur, no peripheral edema edema.   Gastrointestinal system: soft, nontender nondistended Central nervous system: Unable to assess due to patient not following commands.  She does move all extremities and speech sounds normal Extremities: moves all, no edema, normal tone Skin: dry, intact, normal temperature, no rashes seen on visualized skin Psychiatry: normal mood, congruent affect, abnormal judgement and insight, no apparent hallucinations during my encounter   Data Reviewed:  Notable labs: Potassium 3.1, glucose 149, calcium 8.5, WBC 13.5  Family Communication: Spoke with son by phone this  afternoon  Disposition: Status is: Inpatient Remains inpatient appropriate because: Persistent encephalopathy of unclear cause with ongoing evaluation   Planned Discharge Destination: Home with Home Health    Time spent: 40 minutes  Author: Pennie Banter, DO 04/03/2022 4:05 PM  For on call review [email or web address].

## 2022-04-03 NOTE — Consult Note (Signed)
[facility] Face-to-Face Psychiatry Consult   Reason for Consult:  hallucinations Referring Physician:  Dr Clyde Lundborg Patient Identification: Tanya Cole MRN:  [id number] Principal Diagnosis: Acute metabolic encephalopathy Diagnosis:  Principal Problem:   Acute metabolic encephalopathy Active Problems:   Steroid-induced psychosis, with hallucinations (HCC)   Hypertension   Hypercholesterolemia   Polymyalgia rheumatica syndrome (HCC)   Leukocytosis   Type 2 diabetes mellitus with cardiac complication (HCC)   Chronic diastolic CHF (congestive heart failure) (HCC)   COPD (chronic obstructive pulmonary disease) (HCC)   CAD (coronary artery disease)   PAF (paroxysmal atrial fibrillation) (HCC)   Total Time spent with patient: 45 minutes  Subjective:   Tanya Cole is a 88 y.o. female patient admitted with medical issues and hallucinations.  HPI:  88 yo female presented with AMS and hallucinations.  She was having visual hallucinations that fish were on the floor last night, none this morning.  She reports she has symptoms when she takes prednisone or has a UTI.  Denies depression, mild anxiety.  NO suicidal/homicidal ideations or substance use.  Her son is at the bedside and reports she did not get to sleep since 4 am and was awake by 7 am.  The client reports poor sleep recently, lives alone.  Her son checked on her Saturday am and found her disoriented and hallucinating, UTI a few months ago.  This provider spoke with her son separately who reports she has had hallucinations at times in the past, usually with a UTI or medications.  "She has very poor memory" and he is concerned about her living at home.  She is not sleeping which could also contribute to her hallucinations.  Symptoms should resolved after resolution of any underlying medical concerns and sleep issues.  Meanwhile, Seroquel at bedtime started for sleep and hallucinations along PRN dose.  Past Psychiatric History: none  Risk to  Self:  none Risk to Others:  none Prior Inpatient Therapy:  none Prior Outpatient Therapy: none   Past Medical History:  Past Medical History:  Diagnosis Date   (HFpEF) heart failure with preserved ejection fraction (HCC)    a. TTE 12/19: EF 55-60%, probable HK of the mid apical anterior septal myocardium, Gr1DD, mild AI, mildly dilated LA; b.07/2020 Echo: EF 60-65%, no rwma, Gr2 DD. Nl RV fxn. Mildly dil LA. Mild MR.   Asthma    CAD (coronary artery disease)    a. 09/2018 NSTEMI/PCI: LM min irregs, mLAD 95 (PCI/DES), mLAD-2 60%, LCx mild diff dzs, RCA min irregs; b. 03/2020 MV: EF>65%, no ischemia/scar; c. 07/2020 Cath: LM min irregs, LAD 30p, 33m, 90d, D1/2 min irregs, LCX diff dzs throughout, OM1/2/3 mild dzs, RCA 30p, RPDA/RPAV min irrges. EF 55-65%.   CHF (congestive heart failure) (HCC)    Diabetes mellitus (HCC)    Hypercholesterolemia    Hypertension    Myocardial infarction (HCC)    Osteopenia    Palpitations    a. 04/2020 Zio: Avg HR 75. 429 SVT episodes, longest 19 secs @ 133. Occas PACs (3.2%). Rare PVCs (<1%).   Polymyalgia rheumatica syndrome (HCC)    Reactive airway disease    Severe sepsis [facility])     Past Surgical History:  Procedure Laterality Date   ABDOMINAL HYSTERECTOMY  1981   prolapse and bleeding, ovaries not removed   BREAST EXCISIONAL BIOPSY Right    CHOLECYSTECTOMY N/A 09/02/2019   Procedure: LAPAROSCOPIC CHOLECYSTECTOMY WITH INTRAOPERATIVE CHOLANGIOGRAM;  Surgeon: Henrene Dodge, MD;  Location: [facility] ORS;  Service: General;  Laterality: N/A;   CORONARY STENT INTERVENTION N/A 10/08/2018   Procedure: CORONARY STENT INTERVENTION;  Surgeon: Iran Ouch, MD;  Location: [facility] INVASIVE CV LAB;  Service: Cardiovascular;  Laterality: N/A;   ENDOSCOPIC RETROGRADE CHOLANGIOPANCREATOGRAPHY (ERCP) WITH PROPOFOL N/A 08/08/2019   Procedure: ENDOSCOPIC RETROGRADE CHOLANGIOPANCREATOGRAPHY (ERCP) WITH PROPOFOL;  Surgeon: Midge Minium, MD;  Location: [facility] ENDOSCOPY;  Service:  Endoscopy;  Laterality: N/A;   LEFT HEART CATH AND CORONARY ANGIOGRAPHY N/A 10/08/2018   Procedure: LEFT HEART CATH AND CORONARY ANGIOGRAPHY;  Surgeon: Iran Ouch, MD;  Location: [facility] INVASIVE CV LAB;  Service: Cardiovascular;  Laterality: N/A;   LEFT HEART CATH AND CORONARY ANGIOGRAPHY N/A 08/10/2020   Procedure: LEFT HEART CATH AND CORONARY ANGIOGRAPHY possible percutaneous intervention;  Surgeon: Iran Ouch, MD;  Location: [facility] INVASIVE CV LAB;  Service: Cardiovascular;  Laterality: N/A;   UMBILICAL HERNIA REPAIR  7/94   Family History:  Family History  Problem Relation Age of Onset   Arthritis Mother    Heart disease Mother    Heart attack Father    Throat cancer Sister    Parkinson's disease Sister    COPD Brother    COPD Brother    Family Psychiatric  History: none Social History:  Social History   Substance and Sexual Activity  Alcohol Use No   Alcohol/week: 0.0 standard drinks     Social History   Substance and Sexual Activity  Drug Use No    Social History   Socioeconomic History   Marital status: Widowed    Spouse name: Not on file   Number of children: 3   Years of education: Not on file   Highest education level: Not on file  Occupational History   Not on file  Tobacco Use   Smoking status: Never   Smokeless tobacco: Never  Vaping Use   Vaping Use: Never used  Substance and Sexual Activity   Alcohol use: No    Alcohol/week: 0.0 standard drinks   Drug use: No   Sexual activity: Not Currently  Other Topics Concern   Not on file  Social History Narrative   No smoking; no alcohol; in ; worked in Designer, fashion/clothing. Lives by self in [place]. Does all of her own housework. Dtr does food shopping for her.   Social Determinants of Health   Financial Resource Strain: Low Risk    Difficulty of Paying Living Expenses: Not hard at all  Food Insecurity: No Food Insecurity   Worried About Programme researcher, broadcasting/film/video in the Last Year: Never true    Ran Out of Food in the Last Year: Never true  Transportation Needs: No Transportation Needs   Lack of Transportation (Medical): No   Lack of Transportation (Non-Medical): No  Physical Activity: Not on file  Stress: No Stress Concern Present   Feeling of Stress : Not at all  Social Connections: Moderately Integrated   Frequency of Communication with Friends and Family: More than three times a week   Frequency of Social Gatherings with Friends and Family: More than three times a week   Attends Religious Services: More than 4 times per year   Active Member of [employer] or Organizations: Yes   Attends Banker Meetings: Not on file   Marital Status: Widowed   Additional Social History:    Allergies:   Allergies  Allergen Reactions   Tramadol Itching and Nausea And Vomiting    Labs:  Results for orders placed or performed during the hospital encounter of  04/02/22 (from the past 48 hour(s))  Comprehensive metabolic panel     Status: Abnormal   Collection Time: 04/02/22 12:12 PM  Result Value Ref Range   Sodium 144 135 - 145 mmol/L   Potassium 3.5 3.5 - 5.1 mmol/L   Chloride 104 98 - 111 mmol/L   CO2 24 22 - 32 mmol/L   Glucose, Bld 154 (H) 70 - 99 mg/dL    Comment: Glucose reference range applies only to samples taken after fasting for at least 8 hours.   BUN 22 8 - 23 mg/dL   Creatinine, Ser 4.54 (H) 0.44 - 1.00 mg/dL   Calcium 9.1 8.9 - 09.8 mg/dL   Total Protein 7.9 6.5 - 8.1 g/dL   Albumin 4.6 3.5 - 5.0 g/dL   AST 41 15 - 41 U/L   ALT 33 0 - 44 U/L   Alkaline Phosphatase 80 38 - 126 U/L   Total Bilirubin 0.8 0.3 - 1.2 mg/dL   GFR, Estimated 50 (L) >60 mL/min    Comment: (NOTE) Calculated using the CKD-EPI Creatinine Equation (2021)    Anion gap 16 (H) 5 - 15    Comment: Performed at [facility], [address] [place], [place] [address]  CBC     Status: Abnormal   Collection Time: 04/02/22 12:12 PM  Result Value Ref Range   WBC 17.4 (H) 4.0 -  10.5 K/uL   RBC 4.64 3.87 - 5.11 MIL/uL   Hemoglobin 13.7 12.0 - 15.0 g/dL   HCT 78.2 95.6 - 21.3 %   MCV 90.5 80.0 - 100.0 fL   MCH 29.5 26.0 - 34.0 pg   MCHC 32.6 30.0 - 36.0 g/dL   RDW 08.6 57.8 - 46.9 %   Platelets 326 150 - 400 K/uL   nRBC 0.0 0.0 - 0.2 %    Comment: Performed at [facility], [address] [place], [place] [address]  Urinalysis, Routine w reflex microscopic Urine, In & Out Cath     Status: Abnormal   Collection Time: 04/02/22  1:02 PM  Result Value Ref Range   Color, Urine AMBER (A) YELLOW    Comment: BIOCHEMICALS MAY BE AFFECTED BY COLOR   APPearance HAZY (A) CLEAR   Specific Gravity, Urine 1.041 (H) 1.005 - 1.030   pH 5.0 5.0 - 8.0   Glucose, UA NEGATIVE NEGATIVE mg/dL   Hgb urine dipstick NEGATIVE NEGATIVE   Bilirubin Urine NEGATIVE NEGATIVE   Ketones, ur 5 (A) NEGATIVE mg/dL   Protein, ur 841 (A) NEGATIVE mg/dL   Nitrite NEGATIVE NEGATIVE   Leukocytes,Ua NEGATIVE NEGATIVE   RBC / HPF 0-5 0 - 5 RBC/hpf   WBC, UA 0-5 0 - 5 WBC/hpf   Bacteria, UA NONE SEEN NONE SEEN   Squamous Epithelial / LPF 0-5 0 - 5   Mucus PRESENT    Hyaline Casts, UA PRESENT     Comment: Performed at [facility], [address] [place], [place] [address]  Blood gas, venous     Status: Abnormal   Collection Time: 04/02/22  1:02 PM  Result Value Ref Range   pH, Ven 7.42 7.25 - 7.43   pCO2, Ven 38 (L) 44 - 60 mmHg   pO2, Ven 75 (H) 32 - 45 mmHg   Bicarbonate 24.6 20.0 - 28.0 mmol/L   Acid-Base Excess 0.3 0.0 - 2.0 mmol/L   O2 Saturation 95.4 %   Patient temperature 37.0    Collection site VEIN  Comment: Performed at [facility], [address] [place], [place] [address]  Urine Drug Screen, Qualitative [facility] only)     Status: Abnormal   Collection Time: 04/02/22  1:19 PM  Result Value Ref Range   Tricyclic, Ur Screen NONE DETECTED NONE DETECTED   Amphetamines, Ur Screen NONE DETECTED NONE DETECTED   MDMA (Ecstasy)Ur Screen NONE DETECTED  NONE DETECTED   Cocaine Metabolite,Ur [place] NONE DETECTED NONE DETECTED   Opiate, Ur Screen POSITIVE (A) NONE DETECTED   Phencyclidine (PCP) Ur S NONE DETECTED NONE DETECTED   Cannabinoid 50 Ng, Ur [place] NONE DETECTED NONE DETECTED   Barbiturates, Ur Screen NONE DETECTED NONE DETECTED   Benzodiazepine, Ur Scrn NONE DETECTED NONE DETECTED   Methadone Scn, Ur NONE DETECTED NONE DETECTED    Comment: (NOTE) Tricyclics + metabolites, urine    Cutoff 1000 ng/mL Amphetamines + metabolites, urine  Cutoff 1000 ng/mL MDMA (Ecstasy), urine              Cutoff 500 ng/mL Cocaine Metabolite, urine          Cutoff 300 ng/mL Opiate + metabolites, urine        Cutoff 300 ng/mL Phencyclidine (PCP), urine         Cutoff 25 ng/mL Cannabinoid, urine                 Cutoff 50 ng/mL Barbiturates + metabolites, urine  Cutoff 200 ng/mL Benzodiazepine, urine              Cutoff 200 ng/mL Methadone, urine                   Cutoff 300 ng/mL  The urine drug screen provides only a preliminary, unconfirmed analytical test result and should not be used for non-medical purposes. Clinical consideration and professional judgment should be applied to any positive drug screen result due to possible interfering substances. A more specific alternate chemical method must be used in order to obtain a confirmed analytical result. Gas chromatography / mass spectrometry (GC/MS) is the preferred confirm atory method. Performed at [facility], [address] [place], [place] [address]   SARS Coronavirus 2 by RT PCR (hospital order, performed in [facility]) *cepheid single result test* Anterior Nasal Swab     Status: None   Collection Time: 04/02/22  2:01 PM   Specimen: Anterior Nasal Swab  Result Value Ref Range   SARS Coronavirus 2 by RT PCR NEGATIVE NEGATIVE    Comment: (NOTE) SARS-CoV-2 target nucleic acids are NOT DETECTED.  The SARS-CoV-2 RNA is generally detectable in upper and lower respiratory  specimens during the acute phase of infection. The lowest concentration of SARS-CoV-2 viral copies this assay can detect is 250 copies / mL. A negative result does not preclude SARS-CoV-2 infection and should not be used as the sole basis for treatment or other patient management decisions.  A negative result may occur with improper specimen collection / handling, submission of specimen other than nasopharyngeal swab, presence of viral mutation(s) within the areas targeted by this assay, and inadequate number of viral copies (<250 copies / mL). A negative result must be combined with clinical observations, patient history, and epidemiological information.  Fact Sheet for Patients:   RoadLapTop.co.zahttps://www.fda.gov/media/158405/download  Fact Sheet for Healthcare Providers: http://kim-miller.com/https://www.fda.gov/media/158404/download  This test is not yet approved or  cleared by the [place] FDA and has been authorized for detection and/or diagnosis of SARS-CoV-2 by FDA under an Emergency Use Authorization (  EUA).  This EUA will remain in effect (meaning this test can be used) for the duration of the COVID-19 declaration under Section 564(b)(1) of the Act, 21 U.S.C. section 360bbb-3(b)(1), unless the authorization is terminated or revoked sooner.  Performed at [facility], [address] [place], [place] [address]   Glucose, capillary     Status: Abnormal   Collection Time: 04/02/22  5:23 PM  Result Value Ref Range   Glucose-Capillary 152 (H) 70 - 99 mg/dL    Comment: Glucose reference range applies only to samples taken after fasting for at least 8 hours.  Vitamin B12     Status: None   Collection Time: 04/02/22  6:56 PM  Result Value Ref Range   Vitamin B-12 400 180 - 914 pg/mL    Comment: (NOTE) This assay is not validated for testing neonatal or myeloproliferative syndrome specimens for Vitamin B12 levels. Performed at [facility], [address] [place], [place] [address]    TSH     Status: None   Collection Time: 04/02/22  6:56 PM  Result Value Ref Range   TSH 1.112 0.350 - 4.500 uIU/mL    Comment: Performed by a 3rd Generation assay with a functional sensitivity of <=0.01 uIU/mL. Performed at [facility] [address] [place], [place] [address]   Glucose, capillary     Status: Abnormal   Collection Time: 04/02/22  9:10 PM  Result Value Ref Range   Glucose-Capillary 103 (H) 70 - 99 mg/dL    Comment: Glucose reference range applies only to samples taken after fasting for at least 8 hours.  Basic metabolic panel     Status: Abnormal   Collection Time: 04/03/22  6:05 AM  Result Value Ref Range   Sodium 139 135 - 145 mmol/L   Potassium 3.1 (L) 3.5 - 5.1 mmol/L   Chloride 105 98 - 111 mmol/L   CO2 22 22 - 32 mmol/L   Glucose, Bld 149 (H) 70 - 99 mg/dL    Comment: Glucose reference range applies only to samples taken after fasting for at least 8 hours.   BUN 13 8 - 23 mg/dL   Creatinine, Ser 3.70 0.44 - 1.00 mg/dL   Calcium 8.5 (L) 8.9 - 10.3 mg/dL   GFR, Estimated >48 >88 mL/min    Comment: (NOTE) Calculated using the CKD-EPI Creatinine Equation (2021)    Anion gap 12 5 - 15    Comment: Performed at [facility], [address] [place], [place] [address]  CBC     Status: Abnormal   Collection Time: 04/03/22  6:05 AM  Result Value Ref Range   WBC 13.5 (H) 4.0 - 10.5 K/uL   RBC 4.29 3.87 - 5.11 MIL/uL   Hemoglobin 12.6 12.0 - 15.0 g/dL   HCT 50.3 88.8 - 28.0 %   MCV 88.8 80.0 - 100.0 fL   MCH 29.4 26.0 - 34.0 pg   MCHC 33.1 30.0 - 36.0 g/dL   RDW 03.4 91.7 - 91.5 %   Platelets 274 150 - 400 K/uL   nRBC 0.0 0.0 - 0.2 %    Comment: Performed at [facility], [address] [place], [place] [address]  Glucose, capillary     Status: Abnormal   Collection Time: 04/03/22  7:52 AM  Result Value Ref Range   Glucose-Capillary 157 (H) 70 - 99 mg/dL    Comment: Glucose reference range applies only to samples taken  after fasting for at  least 8 hours.  Glucose, capillary     Status: Abnormal   Collection Time: 04/03/22 11:51 AM  Result Value Ref Range   Glucose-Capillary 261 (H) 70 - 99 mg/dL    Comment: Glucose reference range applies only to samples taken after fasting for at least 8 hours.    Current Facility-Administered Medications  Medication Dose Route Frequency Provider Last Rate Last Admin   acetaminophen (TYLENOL) tablet 650 mg  650 mg Oral Q6H PRN Lorretta Harp, MD   650 mg at 04/03/22 1116   albuterol (PROVENTIL) (2.5 MG/3ML) 0.083% nebulizer solution 2.5 mg  2.5 mg Inhalation Q4H PRN Lorretta Harp, MD       aspirin EC tablet 81 mg  81 mg Oral Daily Lorretta Harp, MD   81 mg at 04/03/22 0900   enoxaparin (LOVENOX) injection 40 mg  40 mg Subcutaneous Q24H Tressie Ellis, RPH       folic acid (FOLVITE) tablet 1 mg  1 mg Oral Daily Lorretta Harp, MD   1 mg at 04/03/22 0859   haloperidol lactate (HALDOL) injection 1 mg  1 mg Intravenous Q6H PRN Esaw Grandchild A, DO       hydrALAZINE (APRESOLINE) injection 5 mg  5 mg Intravenous Q2H PRN Lorretta Harp, MD       insulin aspart (novoLOG) injection 0-5 Units  0-5 Units Subcutaneous QHS Lorretta Harp, MD       insulin aspart (novoLOG) injection 0-9 Units  0-9 Units Subcutaneous TID WC Lorretta Harp, MD   5 Units at 04/03/22 1254   ipratropium-albuterol (DUONEB) 0.5-2.5 (3) MG/3ML nebulizer solution 3 mL  3 mL Nebulization BID Lorretta Harp, MD   3 mL at 04/03/22 1610   meclizine (ANTIVERT) tablet 25 mg  25 mg Oral TID PRN Lorretta Harp, MD       metoprolol tartrate (LOPRESSOR) tablet 25 mg  25 mg Oral BID Lorretta Harp, MD   25 mg at 04/03/22 0859   mometasone-formoterol (DULERA) 200-5 MCG/ACT inhaler 2 puff  2 puff Inhalation BID Lorretta Harp, MD   2 puff at 04/03/22 0900   multivitamin-lutein (OCUVITE-LUTEIN) capsule 1 capsule  1 capsule Oral Daily Lorretta Harp, MD   1 capsule at 04/03/22 0900   nitroGLYCERIN (NITROSTAT) SL tablet 0.4 mg  0.4 mg Sublingual Q5 min PRN Lorretta Harp,  MD       ondansetron [facility]) injection 4 mg  4 mg Intravenous Q8H PRN Lorretta Harp, MD       Melene Muller ON 04/04/2022] predniSONE (DELTASONE) tablet 10 mg  10 mg Oral Q breakfast Esaw Grandchild A, DO       QUEtiapine (SEROQUEL) tablet 12.5 mg  12.5 mg Oral BID PRN Charm Rings, NP   12.5 mg at 04/03/22 1116   QUEtiapine (SEROQUEL) tablet 25 mg  25 mg Oral QHS Charm Rings, NP        Musculoskeletal: Strength & Muscle Tone: within normal limits Gait & Station: normal Patient leans: N/A  Psychiatric Specialty Exam: Physical Exam Vitals and nursing note reviewed.  Constitutional:      Appearance: Normal appearance.  HENT:     Head: Normocephalic.     Nose: Nose normal.  Pulmonary:     Effort: Pulmonary effort is normal.  Musculoskeletal:        General: Normal range of motion.     Cervical back: Normal range of motion.  Neurological:     General: No focal deficit present.     Mental Status: She is  alert.  Psychiatric:        Attention and Perception: Attention and perception normal.        Mood and Affect: Affect is blunt.        Speech: Speech normal.        Behavior: Behavior normal. Behavior is cooperative.        Thought Content: Thought content is delusional.        Cognition and Memory: Cognition and memory normal.        Judgment: Judgment normal.    Review of Systems  Psychiatric/Behavioral:  Positive for hallucinations.   All other systems reviewed and are negative.  Blood pressure (!) 154/72, pulse 96, temperature 98.3 F (36.8 C), resp. rate 16, height  (1.549 m), weight 53.5 kg, SpO2 95 %.Body mass index is 22.3 kg/m.  General Appearance: Casual  Eye Contact:  Good  Speech:  Normal Rate  Volume:  Normal  Mood:  Anxious  Affect:  Blunt  Thought Process:  Coherent and Descriptions of Associations: Intact  Orientation:  Full (Time, Place, and Person)  Thought Content:  Logical  Suicidal Thoughts:  No  Homicidal Thoughts:  No  Memory:  Immediate;    Fair Recent;   Fair Remote;   Fair  Judgement:  Fair  Insight:  Fair  Psychomotor Activity:  Decreased  Concentration:  Concentration: Fair and Attention Span: Fair  Recall:  [employer] of Knowledge:  Good  Language:  Good  Akathisia:  No  Handed:  Right  AIMS (if indicated):     Assets:  Housing Leisure Time Resilience Social Support  ADL's:  Intact  Cognition:  WNL  Sleep:        Physical Exam: Physical Exam Vitals and nursing note reviewed.  Constitutional:      Appearance: Normal appearance.  HENT:     Head: Normocephalic.     Nose: Nose normal.  Pulmonary:     Effort: Pulmonary effort is normal.  Musculoskeletal:        General: Normal range of motion.     Cervical back: Normal range of motion.  Neurological:     General: No focal deficit present.     Mental Status: She is alert.  Psychiatric:        Attention and Perception: Attention and perception normal.        Mood and Affect: Affect is blunt.        Speech: Speech normal.        Behavior: Behavior normal. Behavior is cooperative.        Thought Content: Thought content is delusional.        Cognition and Memory: Cognition and memory normal.        Judgment: Judgment normal.   Review of Systems  Psychiatric/Behavioral:  Positive for hallucinations.   All other systems reviewed and are negative. Blood pressure 122/60, pulse 91, temperature 98 F (36.7 C), temperature source Oral, resp. rate 16, height  (1.549 m), weight 53.5 kg, SpO2 95 %. Body mass index is 22.3 kg/m.  Treatment Plan Summary: Prednisone induced psychosis with hallucinations vs insomnia issues vs delirium: STarted Seroquel 25 mg at bedtime and 12.5 mg BID PRN  Disposition: Patient does not meet criteria for psychiatric inpatient admission. Supportive therapy provided about ongoing stressors.  Nanine Means, NP 04/03/2022 2:07 PM

## 2022-04-04 ENCOUNTER — Ambulatory Visit: Payer: Medicaid Other

## 2022-04-04 ENCOUNTER — Telehealth: Payer: Self-pay

## 2022-04-04 LAB — BASIC METABOLIC PANEL
Anion gap: 9 (ref 5–15)
BUN: 14 mg/dL (ref 8–23)
CO2: 25 mmol/L (ref 22–32)
Calcium: 8.4 mg/dL — ABNORMAL LOW (ref 8.9–10.3)
Chloride: 109 mmol/L (ref 98–111)
Creatinine, Ser: 0.65 mg/dL (ref 0.44–1.00)
GFR, Estimated: >60 mL/min (ref >60)
Glucose, Bld: 152 mg/dL — ABNORMAL HIGH (ref 70–99)
Potassium: 3.2 mmol/L — ABNORMAL LOW (ref 3.5–5.1)
Sodium: 143 mmol/L (ref 135–145)

## 2022-04-04 LAB — GLUCOSE, CAPILLARY
Glucose-Capillary: 161 mg/dL — ABNORMAL HIGH (ref 70–99)
Glucose-Capillary: 171 mg/dL — ABNORMAL HIGH (ref 70–99)
Glucose-Capillary: 152 mg/dL — ABNORMAL HIGH (ref 70–99)
Glucose-Capillary: 136 mg/dL — ABNORMAL HIGH (ref 70–99)

## 2022-04-04 LAB — CBC
HCT: 37.5 % (ref 36.0–46.0)
Hemoglobin: 12.3 g/dL (ref 12.0–15.0)
MCH: 29.4 pg (ref 26.0–34.0)
MCHC: 32.8 g/dL (ref 30.0–36.0)
MCV: 89.7 fL (ref 80.0–100.0)
Platelets: 276 10*3/uL (ref 150–400)
RBC: 4.18 MIL/uL (ref 3.87–5.11)
RDW: 13.7 % (ref 11.5–15.5)
WBC: 11.6 10*3/uL — ABNORMAL HIGH (ref 4.0–10.5)
nRBC: 0 % (ref 0.0–0.2)

## 2022-04-04 LAB — PROCALCITONIN: Procalcitonin: <0.1 ng/mL

## 2022-04-04 LAB — MAGNESIUM: Magnesium: 1.5 mg/dL — ABNORMAL LOW (ref 1.7–2.4)

## 2022-04-04 MED ORDER — METOPROLOL TARTRATE 50 MG PO TABS
50.0000 mg | ORAL_TABLET | Freq: Two times a day (BID) | ORAL | Status: DC
  Administered 2022-04-04 – 2022-04-06 (×4): 50 mg via ORAL
  Filled 2022-04-04 (×4): qty 1

## 2022-04-04 MED ORDER — POTASSIUM CHLORIDE CRYS ER 20 MEQ PO TBCR
40.0000 meq | EXTENDED_RELEASE_TABLET | ORAL | Status: AC
  Administered 2022-04-04 (×2): 40 meq via ORAL
  Filled 2022-04-04 (×2): qty 2

## 2022-04-04 MED ORDER — PREDNISOLON-MOXIFLOX-BROMFENAC 1-0.5-0.075 % OP SOLN
1.0000 [drp] | Freq: Four times a day (QID) | OPHTHALMIC | Status: DC
  Administered 2022-04-04 – 2022-04-06 (×6): 1 [drp] via OPHTHALMIC
  Filled 2022-04-04: qty 5

## 2022-04-04 MED ORDER — MAGNESIUM SULFATE 2 GM/50ML IV SOLN
2.0000 g | Freq: Once | INTRAVENOUS | Status: AC
  Administered 2022-04-04: 2 g via INTRAVENOUS

## 2022-04-04 NOTE — Progress Notes (Signed)
Progress Note   Patient: Tanya Cole IRC:[id number] DOB: 1933/04/11 DOA: 04/02/2022     1 DOS: the patient was seen and examined on 04/04/2022   Brief hospital course: 88 y.o. female who lives alone at home with medical history significant of CAD, dHCF, HTN, HLD, COPD, PMR, who presents with agitation, altered mental status and vision hallucinations, with family reporting worsening confusion and combativeness over past several days.    Admitted for further evaluation of acute encephalopathy.    Assessment and Plan: * Acute metabolic encephalopathy Etiology is not clear.  Urinalysis negative.  VBG showed CO2 38.  MRI of the brain is negative for acute issues.  Differential diagnosis includes delirium, side effects of steroid, early dementia with behavior change and psychosis.  Vitamin B12 and TSH are normal. Chest x-ray on admission did show a lingular opacity concerning for pneumonia, opacity in the medial right lung base --Will start coverage for pneumonia --Delirium precautions --Neuro checks --Psychiatry consulted --Seroquel has been started at bedtime and as needed during the day --Psych recommends Zyprexa if Seroquel is ineffective --As needed IV Haldol if agitation despite Seroquel  CAP (community acquired pneumonia) Patient presented with altered mental status and hallucinations, family reported worsening confusion over several days.  Patient is poor historian but no mention of respiratory symptoms by family. Chest x-ray on admission showed a lingular opacity concerning for pneumonia, medial right lung base opacity as well. This could be etiology of patient's encephalopathy. -- Continue IV Rocephin and Zithromax -- Likely transition to p.o. antibiotic tomorrow  Steroid-induced psychosis, with hallucinations (HCC) Patient likely has mild dementia, has been on prednisone for quite some time for RA and polymyalgia rheumatica which can lead to psychosis esp in elderly with  cognitive decline.  It seems there may be confusion with her current dose as daughter on admission reported to the admitting hospitalist that she was not sure whether patient should be taking 10 or 15 mg.  On chart review of last rheumatology follow-up 4/13, prednisone was reduced from 5 down to 2.5 mg. -- Continue prednisone 2.5 mg daily with breakfast -- Consider stopping prednisone if possible  Hypokalemia Potassium 3.1 on 6/4, replaced. K today still 3.2 Replace with 40 mEq x2 oral KCl. Monitor BMP, replace K as needed. Monitor Mg level as well.  Polymyalgia rheumatica syndrome (HCC) Follows with rheumatology for this and RA, Dr. Allena Katz.  Note reviewed from follow-up visit on 02/10/2022 when prednisone had been decreased to 2.5 mg daily, methotrexate 12.5 mg weekly was continued along with folic acid 1 mg daily.  Admitting hospitalist was told by daughter that she was not sure if patient was taking 10 or 15 mg prednisone, and patient not taking methotrexate. -- Reduce prednisone to 2.5 mg daily -- Close follow-up with rheumatology -- Will advised to continue methotrexate or discuss with rheumatologist   PAF (paroxysmal atrial fibrillation) [facility]) Daughter reported history of atrial fibrillation, not on anticoagulation.   EKG on admission showed A-fib.   Heart rates are controlled. --Will discontinue telemetry as it is significantly worsening her agitation --Continue metoprolol   CAD (coronary artery disease) Stable no active chest pain. Status post stent placement. Continue aspirin  COPD (chronic obstructive pulmonary disease) (HCC) Stable -- Continue bronchodilators  Hypomagnesemia Mg 1.5 today.  Replacing with 2 g IV Mg-sulfate.  Monitor replace further as needed  Chronic diastolic CHF (congestive heart failure) (HCC) Echo on 11/01/2021 - EF 60 to 65%.   Patient appears euvolemic and well compensated. --Monitor  volume status -- Continue metoprolol -- Appears not on  diuretics  Type 2 diabetes mellitus with cardiac complication (HCC) - Recent A1c 8.0, poorly controlled.   --Hold metformin --Sliding scale NovoLog  Leukocytosis WBC 17.1 on admission.  No signs or symptoms of infection, afebrile.   Suspect due to home prednisone. --Monitor CBC -- Monitor clinically for signs or symptoms of infection  Hypercholesterolemia Daughter reported patient stopped taking Lipitor.  Previously on Lipitor 80 mg.  Hypertension Continue home metoprolol IV hydralazine as needed  6/5: BP's elevated, increased metoprolol 25 >> 50 mg BID        Subjective: Patient is awake resting in bed, sitter at bedside.  Patient reports feeling a little better today.  Today she answers questions appropriately and makes sense during conversation.  She was much more confused and restless yesterday.  She denies any acute complaints at this time.  No acute events reported  Physical Exam: Vitals:   04/04/22 0511 04/04/22 0838 04/04/22 1420 04/04/22 1605  BP: 133/61 (!) 167/80 (!) 141/74 (!) 156/72  Pulse: 88 98 87 85  Resp: 18 15  20   Temp: 98.2 F (36.8 C) 97.8 F (36.6 C)  98.4 F (36.9 C)  TempSrc: Oral     SpO2: 99% 97%  97%  Weight:      Height:       General exam: Awake resting in bed, alert and in no acute distress HEENT: Wearing glasses, moist mucus membranes, hearing grossly normal  Respiratory system: Lungs clear with diminished bases, normal respiratory effort, on room air. Cardiovascular system: Regular rate and rhythm, systolic murmur noted, no peripheral edema.   Central nervous system: Alert and oriented to self and place, grossly nonfocal exam, normal speech Extremities: moves all, no edema, normal tone Skin: Dry, intact, no rashes seen on visualized skin Psychiatry: normal mood, congruent affect, abnormal judgement and insight, no apparent hallucinations during my encounter   Data Reviewed:  Notable labs: Potassium 3.2, glucose 152, calcium 8.4,  magnesium 1.5, white count improved 11.6.  Procalcitonin less than 0.10.  CBGs at goal  Family Communication: Spoke with son by phone afternoon 6/4.    Disposition: Status is: Inpatient Remains inpatient appropriate because: Persistent encephalopathy although improving, persistent electrolyte derangements requiring replacement and close monitoring, on IV antibiotics.  Anticipate d/c in 1-2 days pending further improvement    Planned Discharge Destination: Home with Home Health    Time spent: 35 minutes  Author: Pennie BanterKelly A Pegi Milazzo, DO 04/04/2022 5:21 PM  For on call review [email or web address].

## 2022-04-04 NOTE — Telephone Encounter (Signed)
Pt daughter returning call and said pt is in the hospital and in bad shape

## 2022-04-04 NOTE — Telephone Encounter (Signed)
Fyi   LM for pt daughter Victorino DikeJennifer to cb to get more info

## 2022-04-04 NOTE — Telephone Encounter (Signed)
Pt admitted [facility] on Saturday for hallucinations.  MRI Brain - neg for stroke. UTI - neg. Psychiatry/Neurology bedside consult ordered.  Daughter will keep [place] updated, Victorino Dike.

## 2022-04-04 NOTE — Telephone Encounter (Signed)
LM for pt daughter to cb re: more info. What is she in the hospital for ? When did she get admitted ? What testing have they done ?  What does it mean shes in bad shape? Why ?

## 2022-04-04 NOTE — Evaluation (Signed)
Occupational Therapy Evaluation Patient Details Name: Tanya Cole MRN: [id number] DOB: 16-Dec-1932 Today's Date: 04/04/2022   History of Present Illness Tanya Cole is a 88 y.o. female with medical history significant of CAD, dHCF, HTN, HLD, COPD, PMR, who presents with AMS and vision hallucination   Clinical Impression   Patient presenting with decreased Ind in self care, balance, functional mobility/transfers, endurance, and safety awareness. Patient reports living at home alone with use of RW for functional mobility. She reports being able to complete self care tasks and laundry herself but daughters assist with taking her to appointments and deep cleaning in the home. She reports daughters check in daily. She was alert and oriented x 4 this session and pleasant. PPatient currently functioning at supervision level with use of RW for functional mobility and toileting needs.  Patient will benefit from acute OT to increase overall independence in the areas of ADLs, functional mobility, and safety awareness in order to safely discharge home with family support.      Recommendations for follow up therapy are one component of a multi-disciplinary discharge planning process, led by the attending physician.  Recommendations may be updated based on patient status, additional functional criteria and insurance authorization.   Follow Up Recommendations  Home health OT    Assistance Recommended at Discharge Intermittent Supervision/Assistance  Patient can return home with the following A little help with walking and/or transfers;A little help with bathing/dressing/bathroom;Help with stairs or ramp for entrance;Assist for transportation;Assistance with cooking/housework;Direct supervision/assist for financial management;Direct supervision/assist for medications management    Functional Status Assessment  Patient has had a recent decline in their functional status and demonstrates the ability  to make significant improvements in function in a reasonable and predictable amount of time.  Equipment Recommendations  None recommended by OT       Precautions / Restrictions Precautions Precautions: Fall Restrictions Weight Bearing Restrictions: No      Mobility Bed Mobility Overal bed mobility: Modified Independent                  Transfers Overall transfer level: Modified independent Equipment used: Rolling walker (2 wheels)                      Balance Overall balance assessment: Needs assistance, History of Falls Sitting-balance support: Feet supported Sitting balance-Leahy Scale: Fair     Standing balance support: Bilateral upper extremity supported, During functional activity, Reliant on assistive device for balance Standing balance-Leahy Scale: Fair                             ADL either performed or assessed with clinical judgement   ADL Overall ADL's : Needs assistance/impaired     Grooming: Wash/dry hands;Wash/dry face;Standing;Supervision/safety                   Toilet Transfer: Supervision/safety;Rolling walker (2 wheels)   Toileting- Architect and Hygiene: Supervision/safety;Sit to/from stand       Functional mobility during ADLs: Supervision/safety;Rolling walker (2 wheels)       Vision Patient Visual Report: No change from baseline              Pertinent Vitals/Pain Pain Assessment Pain Assessment: No/denies pain     Hand Dominance Right   Extremity/Trunk Assessment Upper Extremity Assessment Upper Extremity Assessment: Defer to OT evaluation   Lower Extremity Assessment Lower Extremity Assessment: Generalized weakness  Communication Communication Communication: No difficulties   Cognition Arousal/Alertness: Awake/alert Behavior During Therapy: WFL for tasks assessed/performed Overall Cognitive Status: Impaired/Different from baseline Area of Impairment: Orientation,  Safety/judgement, Awareness, Memory, Problem solving                               General Comments: Pt is oriented x 4, pleasant, and agreeable.                Home Living Family/patient expects to be discharged to:: Private residence Living Arrangements: Alone Available Help at Discharge: Available PRN/intermittently;Family Type of Home: House Home Access: Stairs to enter Secretary/administrator of Steps: 2 Entrance Stairs-Rails: None Home Layout: One level     Bathroom Shower/Tub: Tub/shower unit;Sponge bathes at baseline   [employer]: Standard     Home Equipment: Standard Walker;BSC/3in1;Cane - single point;Grab bars - tub/shower          Prior Functioning/Environment               Mobility Comments: Pt reports being mod I with use of RW for mobility and transfers. She does not drive and family assists her with appointments ADLs Comments: Pt reports being mod I with self care and laundry. She needs some help from family for deep cleaning. Daughters check in daily.        OT Problem List: Decreased strength;Decreased activity tolerance;Impaired balance (sitting and/or standing);Decreased knowledge of use of DME or AE;Decreased safety awareness;Decreased cognition      OT Treatment/Interventions: Self-care/ADL training;Balance training;Therapeutic exercise;Therapeutic activities;Energy conservation;Cognitive remediation/compensation;DME and/or AE instruction;Patient/family education    OT Goals(Current goals can be found in the care plan section) Acute Rehab OT Goals Patient Stated Goal: to go home OT Goal Formulation: With patient Time For Goal Achievement: 04/18/22 Potential to Achieve Goals: Good  OT Frequency: Min 2X/week       AM-PAC OT "6 Clicks" Daily Activity     Outcome Measure Help from another person eating meals?: None Help from another person taking care of personal grooming?: None Help from another person toileting, which  includes using toliet, bedpan, or urinal?: A Little Help from another person bathing (including washing, rinsing, drying)?: A Little Help from another person to put on and taking off regular upper body clothing?: None Help from another person to put on and taking off regular lower body clothing?: None 6 Click Score: 22   End of Session Equipment Utilized During Treatment: Rolling walker (2 wheels) Nurse Communication: Mobility status  Activity Tolerance: Patient tolerated treatment well Patient left: in bed;with bed alarm set;with chair alarm set;with nursing/sitter in room  OT Visit Diagnosis: Unsteadiness on feet (R26.81);Muscle weakness (generalized) (M62.81)                Time: 8786-7672 OT Time Calculation (min): 12 min Charges:  OT General Charges $OT Visit: 1 Visit OT Evaluation $OT Eval Low Complexity: 1 Low  Jackquline Denmark, MS, OTR/L , CBIS ascom [phone]  04/04/22, 12:42 PM

## 2022-04-04 NOTE — TOC Initial Note (Signed)
Transition of Care [facility]) - Initial/Assessment Note    Patient Details  Name: Tanya Cole MRN: [id number] Date of Birth: 1933/09/26  Transition of Care [facility]) CM/SW Contact:    Pete Pelt, RN Phone Number: 04/04/2022, 4:20 PM  Clinical Narrative:   Patient is not alert and oriented.  Spoke to patient's daughter who states that patient lives alone and she is concerned about patient's cognitive status.  She states that patient has gone out for a walk and got lost.  Daughter also states that patient has started frying meat for dinner and left it unattended, forgetting stove was on.  Daughter and the rest of the family are concerned as well because patient is not able to remain compliant with medication regimen as she does not always remember what to take/when to take it.  Daughter is going to speak with family tonight to explore if there are any payor resources for Assisted Living/Memory care.  They will also research how often family is able to spend time with her and if they can obtain more assistance for patient at home.  Daughter states she will meet with [facility] tomorrow.  TOC to follow.                Expected Discharge Plan:  (TBD) Barriers to Discharge: Continued Medical Work up   Patient Goals and CMS Choice        Expected Discharge Plan and Services Expected Discharge Plan:  (TBD)   Discharge Planning Services: CM Consult Post Acute Care Choice:  (TBD) Living arrangements for the past 2 months: Single Family Home                                      Prior Living Arrangements/Services Living arrangements for the past 2 months: Single Family Home Lives with:: Self Patient language and need for interpreter reviewed:: Yes        Need for Family Participation in Patient Care: Yes (Comment) Care giver support system in place?: Yes (comment)   Criminal Activity/Legal Involvement Pertinent to Current Situation/Hospitalization: No - Comment as  needed  Activities of Daily Living Home Assistive Devices/Equipment: Walker (specify type) ADL Screening (condition at time of admission) Patient's cognitive ability adequate to safely complete daily activities?: Yes Is the patient deaf or have difficulty hearing?: No Does the patient have difficulty seeing, even when wearing glasses/contacts?: Yes Does the patient have difficulty concentrating, remembering, or making decisions?: Yes Patient able to express need for assistance with ADLs?: Yes Does the patient have difficulty dressing or bathing?: No Independently performs ADLs?: Yes (appropriate for developmental age) Does the patient have difficulty walking or climbing stairs?: Yes Weakness of Legs: Both Weakness of Arms/Hands: None  Permission Sought/Granted Permission sought to share information with : Case Manager Permission granted to share information with : Yes, Verbal Permission Granted              Emotional Assessment Appearance:: Appears stated age       Alcohol / Substance Use: Not Applicable Psych Involvement: No (comment)  Admission diagnosis:  Acute encephalopathy [id number] Acute metabolic encephalopathy [id number] Patient Active Problem List   Diagnosis Date Noted   Steroid-induced psychosis, with hallucinations ([place]) [id number]   Acute metabolic encephalopathy [id number]   PAF (paroxysmal atrial fibrillation) ([place]) 04/02/2022   CAD (coronary artery disease) 01/25/2022   Wrist pain 11/29/2021   Polyarthritis 10/31/2021   Limited mobility  Dizziness 10/26/2021   History of COVID-19 06/27/2021   COPD (chronic obstructive pulmonary disease) ([place]) 05/21/2021   Hypomagnesemia 05/14/2021   Hypokalemia    Asthma exacerbation    Joint ache 04/09/2021   Calcified granuloma of lung ([place]) 03/02/2021   Acute exacerbation of chronic obstructive pulmonary disease (COPD) ([place]) 03/01/2021   COPD exacerbation ([place]) 11/29/2020   Chronic diastolic CHF (congestive  heart failure) ([place]) 11/29/2020   CAP (community acquired pneumonia) 11/29/2020   Rib pain on right side 10/19/2020   Abnormal CXR 10/19/2020   Type 2 diabetes mellitus with cardiac complication ([place]) [id number]   Elevated troponin 10/08/2020   Acute on chronic heart failure with preserved ejection fraction (HFpEF) ([place]) 10/08/2020   COPD with acute exacerbation ([place]) 08/19/2020   History of non-ST elevation myocardial infarction (NSTEMI) 08/07/2020   Asthma 08/07/2020   Leukocytosis 08/07/2020   Left shoulder pain 07/12/2020   Iron deficiency 04/16/2020   Anemia 04/04/2020   SOB (shortness of breath) 03/25/2020   Cough 09/30/2019   Gallstone pancreatitis    Pre-op evaluation 08/30/2019   Cholecystitis 08/05/2019   Choledocholithiasis    Coronary artery disease of native heart with stable angina pectoris ([place]) 12/29/2018   Chest tightness 10/07/2018   Memory change 05/27/2018   Osteoporosis 02/05/2018   Weight loss 06/24/2017   Abdominal pain, left lower quadrant 10/05/2016   Polymyalgia rheumatica syndrome ([place]) 05/16/2016   Long term current use of systemic steroids 05/16/2016   Elevated erythrocyte sedimentation rate 05/04/2016   Back pain 04/29/2016   Fatigue 05/24/2015   Health care maintenance 01/25/2015   UTI (urinary tract infection) 07/07/2014   Neuropathy 03/24/2014   Diverticulitis 02/24/2013   Reactive airway disease 09/15/2012   Hypertension 09/14/2012   Hypercholesterolemia 09/14/2012   Diabetes mellitus with cardiac complication ([place]) [id number]   PCP:  Einar Pheasant, MD Pharmacy:   [facility] [address]), [place]) [place] [address] Phone: [phone] Fax: [name] [address] Phone: [phone] Fax: [phone]     Social Determinants of Health (SDOH) Interventions    Readmission Risk  Interventions    05/14/2021    9:26 AM  Readmission Risk Prevention Plan  Transportation Screening Complete  PCP or Specialist Appt within 5-7 Days Complete  Home Care Screening Complete  Medication Review (RN CM) Referral to Pharmacy

## 2022-04-04 NOTE — Telephone Encounter (Signed)
Patient's daughter, Ezra Sites, called to state she would like to let Dr. Dale [place] know that patient is in the hospital and she is not doing well.  Victorino Dike states we may call her and leave a voicemail since she is at work.

## 2022-04-04 NOTE — Progress Notes (Signed)
Physical Therapy Treatment Patient Details Name: Tanya Cole MRN: [id number] DOB: 20-Jul-1933 Today's Date: 04/04/2022   History of Present Illness Tanya Cole is a 88 y.o. female with medical history significant of CAD, dHCF, HTN, HLD, COPD, PMR, who presents with AMS and vision hallucination    PT Comments    Pt received upright in bed agreeable to PT services. Pt mod-I with bed mobility using bed features and able to safely stand to RW with mod-I and safe hand placement. Pt progressing gait to ~160' with intermittent VC's throughout bout for obstacle navigation to prevent running into hallway wall on L side with only fair carryover. Overall pt safe with correct RW use with good balance and stability during bouts. Pt reports decreased gait speed compared to normal because of LE weakness but otherwise is moving close to her baseline. VC's for visual cue in hallway to maintain gait in straight line to prevent running into walls. Pt returned to recliner with all needs in reach with sitter present in room. Pt progressing in mobility but still has some minor safety awareness deficits. D/c recs remain appropriate at this time.    Recommendations for follow up therapy are one component of a multi-disciplinary discharge planning process, led by the attending physician.  Recommendations may be updated based on patient status, additional functional criteria and insurance authorization.  Follow Up Recommendations  Home health PT     Assistance Recommended at Discharge Frequent or constant Supervision/Assistance  Patient can return home with the following A little help with walking and/or transfers;A little help with bathing/dressing/bathroom;Assist for transportation;Help with stairs or ramp for entrance;Assistance with cooking/housework;Direct supervision/assist for financial management;Direct supervision/assist for medications management   Equipment Recommendations  None recommended by PT     Recommendations for Other Services       Precautions / Restrictions Precautions Precautions: Fall Restrictions Weight Bearing Restrictions: No     Mobility  Bed Mobility Overal bed mobility: Modified Independent               Patient Response: Cooperative  Transfers Overall transfer level: Modified independent Equipment used: Rolling walker (2 wheels)                    Ambulation/Gait Ambulation/Gait assistance: Min guard Gait Distance (Feet): 160 Feet Assistive device: Rolling walker (2 wheels) Gait Pattern/deviations: Step-through pattern, Decreased step length - right, Decreased step length - left, Drifts right/left       General Gait Details: Requiring VC's to prevent running into walls in hallway. Good safety awareness and safe use of RW otherwise.   Stairs             Wheelchair Mobility    Modified Rankin (Stroke Patients Only)       Balance Overall balance assessment: Needs assistance, History of Falls Sitting-balance support: Feet supported Sitting balance-Leahy Scale: Fair     Standing balance support: Bilateral upper extremity supported, During functional activity, Reliant on assistive device for balance Standing balance-Leahy Scale: Fair                              Cognition Arousal/Alertness: Awake/alert Behavior During Therapy: WFL for tasks assessed/performed Overall Cognitive Status: Impaired/Different from baseline Area of Impairment: Orientation, Awareness, Memory                               General Comments:  Pleasant and agreeable. Has some confusion with orientation initially requiring re-direction that pt is in hospital.        Exercises      General Comments        Pertinent Vitals/Pain Pain Assessment Pain Assessment: No/denies pain    Home Living Family/patient expects to be discharged to:: Private residence Living Arrangements: Alone Available Help at Discharge:  Available PRN/intermittently;Family Type of Home: House Home Access: Stairs to enter Entrance Stairs-Rails: None Entrance Stairs-Number of Steps: 2   Home Layout: One level Home Equipment: Standard Walker;BSC/3in1;Cane - single point;Grab bars - tub/shower      Prior Function            PT Goals (current goals can now be found in the care plan section) Acute Rehab PT Goals Patient Stated Goal: none stated, no family present PT Goal Formulation: Patient unable to participate in goal setting    Frequency    Min 2X/week      PT Plan Current plan remains appropriate    Co-evaluation              AM-PAC PT "6 Clicks" Mobility   Outcome Measure  Help needed turning from your back to your side while in a flat bed without using bedrails?: None Help needed moving from lying on your back to sitting on the side of a flat bed without using bedrails?: None Help needed moving to and from a bed to a chair (including a wheelchair)?: A Little Help needed standing up from a chair using your arms (e.g., wheelchair or bedside chair)?: A Little Help needed to walk in hospital room?: A Little Help needed climbing 3-5 steps with a railing? : A Little 6 Click Score: 20    End of Session Equipment Utilized During Treatment: Gait belt Activity Tolerance: Patient tolerated treatment well Patient left: in chair;with call bell/phone within reach;with nursing/sitter in room Nurse Communication: Mobility status PT Visit Diagnosis: Unsteadiness on feet (R26.81);Other abnormalities of gait and mobility (R26.89);Muscle weakness (generalized) (M62.81);Difficulty in walking, not elsewhere classified (R26.2)     Time: [id number] PT Time Calculation (min) (ACUTE ONLY): 17 min  Charges:  $Therapeutic Exercise: 8-22 mins                     Delphia GratesMilton M. Fairly IV, PT, DPT Physical Therapist- [place]  [facility]  04/04/2022, 2:41 PM

## 2022-04-04 NOTE — Assessment & Plan Note (Signed)
Mg repleted

## 2022-04-05 LAB — GLUCOSE, CAPILLARY
Glucose-Capillary: 128 mg/dL — ABNORMAL HIGH (ref 70–99)
Glucose-Capillary: 165 mg/dL — ABNORMAL HIGH (ref 70–99)
Glucose-Capillary: 127 mg/dL — ABNORMAL HIGH (ref 70–99)
Glucose-Capillary: 131 mg/dL — ABNORMAL HIGH (ref 70–99)

## 2022-04-05 LAB — BASIC METABOLIC PANEL
Anion gap: 7 (ref 5–15)
BUN: 16 mg/dL (ref 8–23)
CO2: 23 mmol/L (ref 22–32)
Calcium: 8.6 mg/dL — ABNORMAL LOW (ref 8.9–10.3)
Chloride: 111 mmol/L (ref 98–111)
Creatinine, Ser: 0.64 mg/dL (ref 0.44–1.00)
GFR, Estimated: >60 mL/min (ref >60)
Glucose, Bld: 134 mg/dL — ABNORMAL HIGH (ref 70–99)
Potassium: 3.9 mmol/L (ref 3.5–5.1)
Sodium: 141 mmol/L (ref 135–145)

## 2022-04-05 LAB — CBC
HCT: 38.5 % (ref 36.0–46.0)
Hemoglobin: 12.5 g/dL (ref 12.0–15.0)
MCH: 29.6 pg (ref 26.0–34.0)
MCHC: 32.5 g/dL (ref 30.0–36.0)
MCV: 91.2 fL (ref 80.0–100.0)
Platelets: 267 10*3/uL (ref 150–400)
RBC: 4.22 MIL/uL (ref 3.87–5.11)
RDW: 14 % (ref 11.5–15.5)
WBC: 9.7 10*3/uL (ref 4.0–10.5)
nRBC: 0 % (ref 0.0–0.2)

## 2022-04-05 LAB — PROCALCITONIN: Procalcitonin: <0.1 ng/mL

## 2022-04-05 LAB — MAGNESIUM: Magnesium: 2 mg/dL (ref 1.7–2.4)

## 2022-04-05 MED ORDER — ORAL CARE MOUTH RINSE
15.0000 mL | Freq: Two times a day (BID) | OROMUCOSAL | Status: DC
  Administered 2022-04-05 – 2022-04-06 (×2): 15 mL via OROMUCOSAL

## 2022-04-05 MED ORDER — AZITHROMYCIN 500 MG PO TABS
500.0000 mg | ORAL_TABLET | Freq: Every day | ORAL | Status: DC
Start: 2022-04-05 — End: 2022-04-06
  Administered 2022-04-05 – 2022-04-06 (×2): 500 mg via ORAL
  Filled 2022-04-05 (×2): qty 1

## 2022-04-05 MED ORDER — AMOXICILLIN-POT CLAVULANATE 875-125 MG PO TABS
1.0000 | ORAL_TABLET | Freq: Two times a day (BID) | ORAL | Status: DC
  Administered 2022-04-05 – 2022-04-06 (×3): 1 via ORAL
  Filled 2022-04-05 (×3): qty 1

## 2022-04-05 NOTE — TOC Progression Note (Addendum)
Transition of Care [facility]) - Progression Note    Patient Details  Name: Tanya Cole MRN: [id number] Date of Birth: 1933/08/09  Transition of Care [facility]) CM/SW [place], RN Phone Number: 04/05/2022, 10:14 AM  Clinical Narrative:   RNCM met with patient's 3 children at bedside.  Patient presented alert and oriented during this conversation.    Patient anticipates returning home after discharge.  Discussed Home Health recommendations, family and patient amenable to home health.  Patient has 2 walkers at home, and no DME recommended by therapy.   Patient states she usually stays home during the day, unless she goes out in her yard.  Family states patient gets lost when she wanders down the street at times.She denies any challenges with transportation and states that she has support.    Family state they fill her pill box every week and notice that patient often forgets her afternoon pills.  Discussed family support/reminders.  Family stated they are unable to stay with patient at home due to their work schedules but feel that patient should not be alone all day.  Family members work both first and third shifts.  Discussed the possibility of family taking turns checking on/staying with patient.  Family will discuss with each other and speak to their employers as well.  Resources for [employer] and community support given to family.  RNCM discussed the possibility of getting support from churches, neighbors, co workers or other community programs which they may have affiliation.   Family wished to discuss Assisted Living.  RNCM provided information, and patient became tearful, stating that she wants to remain home.    Family discussed hiring in-home caregiver.  RNCM stated that this would include a private pay, as Medicare would cover home health but not private caregivers.  Family verbalized understanding.  Family asked to speak with hospitalist, hospitalist was  notified by [facility].    Addendum: [id number]:  Jason from [facility] will accept patient for home health PT, OT, [place] and Social Work, MD aware to place order.  Daughter is currently calling personal care assistant agencies to attempt to cover patient at home.  Expected Discharge Plan:  (TBD) Barriers to Discharge: Continued Medical Work up  Expected Discharge Plan and Services Expected Discharge Plan:  (TBD)   Discharge Planning Services: CM Consult Post Acute Care Choice:  (TBD) Living arrangements for the past 2 months: Single Family Home                                       Social Determinants of Health (SDOH) Interventions    Readmission Risk Interventions    05/14/2021    9:26 AM  Readmission Risk Prevention Plan  Transportation Screening Complete  PCP or Specialist Appt within 5-7 Days Complete  Home Care Screening Complete  Medication Review (RN CM) Referral to Pharmacy

## 2022-04-05 NOTE — Progress Notes (Signed)
Occupational Therapy Treatment Patient Details Name: Tanya AskewVirginia Mae Cole MRN: [id number] DOB: Apr 14, 1933 Today's Date: 04/05/2022   History of present illness Pt. is a 88 y.o. female with medical history significant of CAD, dHCF, HTN, HLD, COPD, PMR, who presents with AMS and vision hallucination   OT comments  Pt. Is modified independent with supine to sit to the EOB, Modified independence for transfers with the RW. Supervision standing to perform self-grooming at the sinkside with no LOB. Pt. Is independent with UE and LE dressing. Pt. was assisted to the recliner chair and a chair alarm was placed. Pt. has  caregiver sitter services in her hospital room. Pt. was assisted with problem solving through work simplification strategies with her home laundry tasks. Pt. continues to benefit from OT services for ADL training, and pt. education about work simplification strategies, home modification, and DME.    Recommendations for follow up therapy are one component of a multi-disciplinary discharge planning process, led by the attending physician.  Recommendations may be updated based on patient status, additional functional criteria and insurance authorization.    Follow Up Recommendations  Home health OT    Assistance Recommended at Discharge Intermittent Supervision/Assistance  Patient can return home with the following  A little help with walking and/or transfers;A little help with bathing/dressing/bathroom;Help with stairs or ramp for entrance;Assist for transportation;Assistance with cooking/housework;Direct supervision/assist for financial management;Direct supervision/assist for medications management   Equipment Recommendations  None recommended by OT    Recommendations for Other Services      Precautions / Restrictions         Mobility Bed Mobility Overal bed mobility: Modified Independent                  Transfers Overall transfer level: Modified independent Equipment  used: Rolling walker (2 wheels)                     Balance Overall balance assessment: Needs assistance, History of Falls Sitting-balance support: Feet supported Sitting balance-Leahy Scale: Fair                                     ADL either performed or assessed with clinical judgement   ADL       Grooming: Supervision/safety;Standing           Upper Body Dressing : Independent;Set up   Lower Body Dressing: Set up;Independent               Functional mobility during ADLs: Supervision/safety;Rolling walker (2 wheels)      Extremity/Trunk Assessment Upper Extremity Assessment Upper Extremity Assessment: Generalized weakness            Vision Patient Visual Report: No change from baseline     Perception     Praxis      Cognition Arousal/Alertness: Awake/alert Behavior During Therapy: WFL for tasks assessed/performed Overall Cognitive Status: Impaired/Different from baseline Area of Impairment: Orientation, Awareness, Memory                 Orientation Level: Disoriented to, Place, Time, Situation   Memory: Decreased short-term memory   Safety/Judgement: Decreased awareness of safety, Decreased awareness of deficits Awareness: Intellectual Problem Solving: Requires verbal cues, Requires tactile cues General Comments: Pleasant and agreeable. Has some confusion with orientation initially requiring re-direction that pt is in hospital.        Exercises  Shoulder Instructions       General Comments      Pertinent Vitals/ Pain        No reports of pain  Home Living                                          Prior Functioning/Environment              Frequency  Min 2X/week        Progress Toward Goals  OT Goals(current goals can now be found in the care plan section)     Acute Rehab OT Goals Patient Stated Goal: To retrun home OT Goal Formulation: With patient Time For Goal  Achievement: 04/18/22 Potential to Achieve Goals: Good  Plan      Co-evaluation                 AM-PAC OT "6 Clicks" Daily Activity     Outcome Measure   Help from another person eating meals?: None Help from another person taking care of personal grooming?: None Help from another person toileting, which includes using toliet, bedpan, or urinal?: A Little Help from another person bathing (including washing, rinsing, drying)?: A Little Help from another person to put on and taking off regular upper body clothing?: None Help from another person to put on and taking off regular lower body clothing?: None 6 Click Score: 22    End of Session Equipment Utilized During Treatment: Rolling walker (2 wheels)  OT Visit Diagnosis: Unsteadiness on feet (R26.81);Muscle weakness (generalized) (M62.81)   Activity Tolerance Patient tolerated treatment well   Patient Left in bed;with bed alarm set;with chair alarm set;with nursing/sitter in room   Nurse Communication Mobility status        Time: 1335-1406 OT Time Calculation (min): 31 min  Charges: OT General Charges $OT Visit: 1 Visit OT Treatments $Self Care/Home Management : 23-37 mins  Olegario Messier, MS, OTR/L  Olegario Messier 04/05/2022, 2:28 PM

## 2022-04-05 NOTE — Progress Notes (Signed)
Physical Therapy Treatment Patient Details Name: Tanya Cole MRN: [id number] DOB: 05-17-1933 Today's Date: 04/05/2022   History of Present Illness Pt. is a 88 y.o. female with medical history significant of CAD, dHCF, HTN, HLD, COPD, PMR, who presents with AMS and vision hallucination    PT Comments    Pt received upright in bed agreeable to PT services. Pt mod-I with bed mobility and  STS transfer to RW with max VC's for safe hand placement with poor carryover. Pt progressing gait to ~180' with supervision. Pt initially with L drifting near wall with visual cues to correct that improves after cuing. Use of horizontal head turns playing "I spy" to improve pt's ability to scan environment for obstacle navigation with good carryover. Due to cognitive task, multiple times pt slows gait speed or requires stopping in order to complete scanning but no LOB or unsteadiness noted throughout bout. Pt returned to supine mod-I after LE therex seated EOB with all needs in reach with sitter present. D/c recs remain appropriate.    Recommendations for follow up therapy are one component of a multi-disciplinary discharge planning process, led by the attending physician.  Recommendations may be updated based on patient status, additional functional criteria and insurance authorization.  Follow Up Recommendations  Home health PT     Assistance Recommended at Discharge Frequent or constant Supervision/Assistance  Patient can return home with the following A little help with walking and/or transfers;A little help with bathing/dressing/bathroom;Assist for transportation;Help with stairs or ramp for entrance;Assistance with cooking/housework;Direct supervision/assist for financial management;Direct supervision/assist for medications management   Equipment Recommendations  None recommended by PT    Recommendations for Other Services       Precautions / Restrictions Precautions Precautions:  Fall Restrictions Weight Bearing Restrictions: No     Mobility  Bed Mobility Overal bed mobility: Modified Independent               Patient Response: Cooperative  Transfers Overall transfer level: Modified independent Equipment used: Rolling walker (2 wheels)                    Ambulation/Gait Ambulation/Gait assistance: Supervision Gait Distance (Feet): 180 Feet Assistive device: Rolling walker (2 wheels) Gait Pattern/deviations: Step-through pattern, Decreased step length - right, Decreased step length - left, Drifts right/left       General Gait Details: Maintains with L drift initially with ambuation. Improves as gait progresses   Stairs             Wheelchair Mobility    Modified Rankin (Stroke Patients Only)       Balance Overall balance assessment: Needs assistance, History of Falls Sitting-balance support: Feet supported Sitting balance-Leahy Scale: Fair     Standing balance support: Bilateral upper extremity supported, During functional activity, Reliant on assistive device for balance Standing balance-Leahy Scale: Fair Standing balance comment: with use of RW             High level balance activites: Head turns (throughout bout with horizontal turns for environment scanning) High Level Balance Comments: Changes in gait speed with minimal improvement in velocity when asked to increase.            Cognition Arousal/Alertness: Awake/alert Behavior During Therapy: WFL for tasks assessed/performed Overall Cognitive Status: Within Functional Limits for tasks assessed  Exercises General Exercises - Lower Extremity Long Arc Quad: AROM, Strengthening, Both, 10 reps, Seated Hip Flexion/Marching: AROM, Strengthening, Both, 10 reps, Seated Toe Raises: AROM, Strengthening, Seated, Both, 10 reps Heel Raises: AROM, Strengthening, Seated, Both, 10 reps    General Comments         Pertinent Vitals/Pain Pain Assessment Pain Assessment: No/denies pain    Home Living                          Prior Function            PT Goals (current goals can now be found in the care plan section) Acute Rehab PT Goals Patient Stated Goal: none stated, no family present PT Goal Formulation: Patient unable to participate in goal setting    Frequency    Min 2X/week      PT Plan Current plan remains appropriate    Co-evaluation              AM-PAC PT "6 Clicks" Mobility   Outcome Measure  Help needed turning from your back to your side while in a flat bed without using bedrails?: None Help needed moving from lying on your back to sitting on the side of a flat bed without using bedrails?: None Help needed moving to and from a bed to a chair (including a wheelchair)?: A Little Help needed standing up from a chair using your arms (e.g., wheelchair or bedside chair)?: A Little Help needed to walk in hospital room?: A Little Help needed climbing 3-5 steps with a railing? : A Little 6 Click Score: 20    End of Session Equipment Utilized During Treatment: Gait belt Activity Tolerance: Patient tolerated treatment well Patient left: in bed;with call bell/phone within reach;with nursing/sitter in room Nurse Communication: Mobility status PT Visit Diagnosis: Unsteadiness on feet (R26.81);Other abnormalities of gait and mobility (R26.89);Muscle weakness (generalized) (M62.81);Difficulty in walking, not elsewhere classified (R26.2)     Time: 7829-5621 PT Time Calculation (min) (ACUTE ONLY): 15 min  Charges:                        Delphia Grates. Fairly IV, PT, DPT Physical Therapist- [place]  [facility]  04/05/2022, 3:29 PM

## 2022-04-05 NOTE — Progress Notes (Signed)
Progress Note   Patient: Tanya Cole:[id number] DOB: 1932-12-26 DOA: 04/02/2022     2 DOS: the patient was seen and examined on 04/05/2022   Brief hospital course: 88 y.o. female who lives alone at home with medical history significant of CAD, dHCF, HTN, HLD, COPD, PMR, who presents with agitation, altered mental status and vision hallucinations, with family reporting worsening confusion and combativeness over past several days.    Admitted for further evaluation of acute encephalopathy.    Assessment and Plan: * Acute metabolic encephalopathy Etiology is not clear.  Urinalysis negative.  VBG showed CO2 38.  MRI of the brain is negative for acute issues.  Differential diagnosis includes delirium, side effects of steroid, early dementia with behavior change and psychosis.  Vitamin B12 and TSH are normal. Chest x-ray on admission did show a lingular opacity concerning for pneumonia, opacity in the medial right lung base -- Treat pneumonia as outlined --Delirium precautions --Psychiatry consulted --Seroquel was been started at bedtime and as needed during the day -- Patient has not required any as needed Seroquel or Haldol since first day of admission.  Psych recommended continuing just Seroquel as needed.  CAP (community acquired pneumonia) Patient presented with altered mental status and hallucinations, family reported worsening confusion over several days.  Patient is poor historian but no mention of respiratory symptoms by family. Chest x-ray on admission showed a lingular opacity concerning for pneumonia, medial right lung base opacity as well. This could be etiology of patient's encephalopathy. -- Started IV Rocephin and Zithromax  -- Transition to oral Augmentin and Zithromax today  Patient's mental status drastically improved over the last 48 hours.  Now at baseline.  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  It seems there may be confusion with her current dose as daughter on admission reported to the admitting hospitalist that she was not sure whether patient should be taking 10 or 15 mg.  On chart review of last rheumatology follow-up 4/13, prednisone was reduced from 5 down to 2.5 mg. -- Continue prednisone 2.5 mg daily with breakfast -- Consider stopping prednisone if possible  Hypokalemia Potassium 3.1 on 6/4, replaced. K today still 3.2 Replace with 40 mEq x2 oral KCl. Monitor BMP, replace K as needed. Monitor Mg level as well.  Polymyalgia rheumatica syndrome (HCC) Follows with rheumatology for this and RA, Dr. Allena Katz.  Note reviewed from follow-up visit on 02/10/2022 when prednisone had been decreased to 2.5 mg daily, methotrexate 12.5 mg weekly was continued along with folic acid 1 mg daily.  Admitting hospitalist was told by daughter that she was not sure if patient was taking 10 or 15 mg prednisone, and patient not taking methotrexate. -- Reduce prednisone to 2.5 mg daily -- Close follow-up with rheumatology -- Will advised to continue methotrexate or discuss with rheumatologist   PAF (paroxysmal atrial fibrillation) [facility]) Daughter reported history of atrial fibrillation, not on anticoagulation.   EKG on admission showed A-fib.   Heart rates are controlled. --Will discontinue telemetry as it is significantly worsening her agitation --Continue metoprolol   CAD (coronary artery disease) Stable no active chest pain. Status post stent placement. Continue aspirin  COPD (chronic obstructive pulmonary disease) (HCC) Stable -- Continue bronchodilators  Hypomagnesemia Mg 1.5 today.  Replacing with 2 g IV Mg-sulfate.  Monitor replace further as needed  Chronic diastolic CHF (congestive heart failure) (HCC) Echo on 11/01/2021 - EF 60 to 65%.   Patient  appears euvolemic and well compensated. --Monitor volume status -- Continue metoprolol -- Appears not on diuretics  Type 2 diabetes mellitus with cardiac complication (HCC) - Recent A1c 8.0, poorly controlled.   --Hold metformin --Sliding scale NovoLog  Leukocytosis WBC 17.1 on admission.  No signs or symptoms of infection, afebrile.   Suspect due to home prednisone. --Monitor CBC -- Monitor clinically for signs or symptoms of infection  Hypercholesterolemia Daughter reported patient stopped taking Lipitor.  Previously on Lipitor 80 mg.  Hypertension Continue home metoprolol IV hydralazine as needed  6/5: BP's elevated, increased metoprolol 25 >> 50 mg BID        Subjective: Patient is awake resting in bed, 2 daughters at bedside on rounds this morning.  Patient reports feeling much better today and family agree she is better.  She has not required any as needed Seroquel her head at all since the first day of admission.  Family expressed concern about patient living alone due to her memory issues, they are not sure they can provide 24/7 care but are working on this together.  They were given information about assisted living memory care but not yet sure if they can afford that.  No other acute complaints from the patient or acute meds reported.   Physical Exam: Vitals:   04/04/22 2029 04/04/22 2108 04/05/22 0526 04/05/22 0712  BP: 119/69 128/71 (!) 125/51 (!) 142/61  Pulse: 82 90 74 79  Resp:  18 18 18   Temp:  98.5 F (36.9 C) 98.2 F (36.8 C) 98.3 F (36.8 C)  TempSrc:    Oral  SpO2:  95% 93% 95%  Weight:      Height:       General exam: Awake sitting up in bed, alert, no acute distress HEENT: Wearing glasses, moist mucus membranes, hearing grossly normal  Respiratory system: CTA B, no wheezes or rhonchi, on room air with normal respiratory effort. Cardiovascular system: RRR, no peripheral edema.   Central nervous system: Alert and oriented to self and place,  grossly nonfocal exam, normal speech Extremities: moves all, no edema, normal tone Psychiatry: normal mood, congruent affect, abnormal judgement and insight due to dementia, no apparent hallucinations during my encounter, patient is calm and interacts appropriately   Data Reviewed:  Notable labs: Glucose 134, calcium 8.6  Family Communication: 2 daughters at bedside on rounds this morning  Disposition: Status is: Inpatient  Remains inpatient appropriate because: Unsafe discharge home until family able to be present for supervision and assistance.  Expect home with home health tomorrow    Planned Discharge Destination: Home with Home Health    Time spent: 35 minutes  Author: Pennie BanterKelly A Honore Wipperfurth, DO 04/05/2022 4:27 PM  For on call review [email or web address].

## 2022-04-05 NOTE — Evaluation (Deleted)
Occupational Therapy Treatment Note Patient Details Name: Tanya Cole MRN: [id number] DOB: 1933-06-04 Today's Date: 04/05/2022   History of Present Illness Pt. is a 88 y.o. female with medical history significant of CAD, dHCF, HTN, HLD, COPD, PMR, who presents with AMS and vision hallucination   Clinical Impression   Pt. Is modified independent with supine to sit to the EOB, Modified independence for transfers with the RW. Supervision standing to perform self-grooming at the sinkside with no LOB. Pt. Is independent with UE and LE dressing. Pt. was assisted to the recliner chair and a chair alarm was placed. Pt. has  caregiver sitter services in her hospital room. Pt. was assisted with problem solving through work simplification strategies with her home laundry tasks. Pt. continues to benefit from OT services for ADL training, and pt. education about work simplification strategies, home modification, and DME.      Recommendations for follow up therapy are one component of a multi-disciplinary discharge planning process, led by the attending physician.  Recommendations may be updated based on patient status, additional functional criteria and insurance authorization.   Follow Up Recommendations  Home health OT    Assistance Recommended at Discharge Intermittent Supervision/Assistance  Patient can return home with the following A little help with walking and/or transfers;A little help with bathing/dressing/bathroom;Help with stairs or ramp for entrance;Assist for transportation;Assistance with cooking/housework;Direct supervision/assist for financial management;Direct supervision/assist for medications management    Functional Status Assessment  Patient has had a recent decline in their functional status and demonstrates the ability to make significant improvements in function in a reasonable and predictable amount of time.  Equipment Recommendations  None recommended by OT     Recommendations for Other Services       Precautions / Restrictions        Mobility Bed Mobility Overal bed mobility: Modified Independent                  Transfers Overall transfer level: Modified independent Equipment used: Rolling walker (2 wheels)                      Balance Overall balance assessment: Needs assistance, History of Falls Sitting-balance support: Feet supported Sitting balance-Leahy Scale: Fair                                     ADL either performed or assessed with clinical judgement   ADL       Grooming: Supervision/safety;Standing           Upper Body Dressing : Independent;Set up   Lower Body Dressing: Set up;Independent               Functional mobility during ADLs: Supervision/safety;Rolling walker (2 wheels)       Vision Patient Visual Report: No change from baseline       Perception     Praxis      Pertinent Vitals/Pain       Hand Dominance     Extremity/Trunk Assessment Upper Extremity Assessment Upper Extremity Assessment: Generalized weakness           Communication     Cognition Arousal/Alertness: Awake/alert Behavior During Therapy: WFL for tasks assessed/performed Overall Cognitive Status: Impaired/Different from baseline Area of Impairment: Orientation, Awareness, Memory                 Orientation Level: Disoriented to, Place, Time, Situation  Memory: Decreased short-term memory   Safety/Judgement: Decreased awareness of safety, Decreased awareness of deficits Awareness: Intellectual Problem Solving: Requires verbal cues, Requires tactile cues General Comments: Pleasant and agreeable. Has some confusion with orientation initially requiring re-direction that pt is in hospital.     General Comments       Exercises     Shoulder Instructions      Home Living                                          Prior Functioning/Environment                           OT Problem List: Decreased strength;Decreased activity tolerance;Impaired balance (sitting and/or standing);Decreased knowledge of use of DME or AE;Decreased safety awareness;Decreased cognition      OT Treatment/Interventions: Self-care/ADL training;Balance training;Therapeutic exercise;Therapeutic activities;Energy conservation;Cognitive remediation/compensation;DME and/or AE instruction;Patient/family education    OT Goals(Current goals can be found in the care plan section) Acute Rehab OT Goals Patient Stated Goal: To retrun home OT Goal Formulation: With patient Time For Goal Achievement: 04/18/22 Potential to Achieve Goals: Good  OT Frequency: Min 2X/week    Co-evaluation              AM-PAC OT "6 Clicks" Daily Activity     Outcome Measure Help from another person eating meals?: None Help from another person taking care of personal grooming?: None Help from another person toileting, which includes using toliet, bedpan, or urinal?: A Little Help from another person bathing (including washing, rinsing, drying)?: A Little Help from another person to put on and taking off regular upper body clothing?: None Help from another person to put on and taking off regular lower body clothing?: None 6 Click Score: 22   End of Session Equipment Utilized During Treatment: Rolling walker (2 wheels) Nurse Communication: Mobility status  Activity Tolerance: Patient tolerated treatment well Patient left: in bed;with bed alarm set;with chair alarm set;with nursing/sitter in room  OT Visit Diagnosis: Unsteadiness on feet (R26.81);Muscle weakness (generalized) (M62.81)                Time: 9629-5284 OT Time Calculation (min): 31 min Charges:  OT General Charges $OT Visit: 1 Visit OT Treatments $Self Care/Home Management : 23-37 mins Olegario Messier, MS, OTR/L   Olegario Messier 04/05/2022, 2:16 PM

## 2022-04-05 NOTE — Consult Note (Signed)
1430: Follow up on consult from 04/03/22: Patient is sitting in chair at bedside at bedside...  She is calm she is calm, alert alert and oriented x3.  He.  She.  She smiles, states that she is feeling better.  Sitter at bedside, states that patient has not had any behavioral issues; calm and cooperative. Sitter mentions that patient has had the feeling of needing to urinate frequently, but only urinates a little. Writer told sitter to tell patient's bedside RN. Writer also sent secure chat to bedside RN and Dr. Denton Lank regarding this and that I d/c'd nighttime Seroquel and left the PRN Seroquel.   Vanetta Mulders, PMHNP-BC

## 2022-04-06 ENCOUNTER — Encounter: Payer: Self-pay | Admitting: Internal Medicine

## 2022-04-06 LAB — GLUCOSE, CAPILLARY
Glucose-Capillary: 159 mg/dL — ABNORMAL HIGH (ref 70–99)
Glucose-Capillary: 197 mg/dL — ABNORMAL HIGH (ref 70–99)

## 2022-04-06 MED ORDER — NITROGLYCERIN 0.4 MG SL SUBL: 0.4000 mg | SUBLINGUAL_TABLET | SUBLINGUAL | 12 refills | Status: DC | PRN

## 2022-04-06 MED ORDER — AMOXICILLIN-POT CLAVULANATE 875-125 MG PO TABS: 1.0000 | ORAL_TABLET | Freq: Two times a day (BID) | ORAL | 0 refills | Status: DC

## 2022-04-06 MED ORDER — QUETIAPINE FUMARATE 25 MG PO TABS: 12.5000 mg | ORAL_TABLET | Freq: Two times a day (BID) | ORAL | 0 refills | Status: DC | PRN

## 2022-04-06 MED ORDER — AZITHROMYCIN 500 MG PO TABS: 500.0000 mg | ORAL_TABLET | Freq: Every day | ORAL | 0 refills | Status: DC

## 2022-04-06 MED ORDER — METOPROLOL TARTRATE 50 MG PO TABS: 50.0000 mg | ORAL_TABLET | Freq: Two times a day (BID) | ORAL | 0 refills | Status: DC

## 2022-04-06 NOTE — Care Management Important Message (Signed)
Important Message  Patient Details  Name: Tanya Cole MRN: [id number] Date of Birth: May 16, 1933   Medicare Important Message Given:  N/A - LOS <3 / Initial given by admissions     Olegario Messier A Avon Mergenthaler 04/06/2022, 8:57 AM

## 2022-04-06 NOTE — Discharge Summary (Signed)
Physician Discharge Summary   Patient: Tanya AskewVirginia Mae Mcguffin MRN: [id number]  DOB: 12-22-1932   Admit:     Date of Admission: 04/02/2022 Admitted from: home   Discharge: Date of discharge: 04/06/22 Disposition: Home health Condition at discharge: good  CODE STATUS: DNR   Diet recommendation: Cardiac and Carb modified diet   Discharge Physician: Sunnie NielsenNatalie Emry Tobin, DO [facility]     PCP: Dale DurhamScott, Charlene, MD  Recommendations for Outpatient Follow-up:  Follow up with PCP Dale DurhamScott, Charlene, MD in 1-2 weeks, montiro BMP and Mg levels    Brief/Interim Summary: 88 y.o. female who lives alone at home with medical history significant of CAD, dHCF, HTN, HLD, COPD, PMR, who presents with agitation, altered mental status and vision hallucinations, with family reporting worsening confusion and combativeness over past several days.  Admitted for further evaluation of acute encephalopathy. Treated for pneumonia and improved. Concern for accidental high dose steroids at home, reduced to appropriate dosage chronic steroids and to f/u w/ rheumatology.   Consultants:  Psychiatry   Procedures:  none      Discharge Diagnoses: Principal Problem:   Acute metabolic encephalopathy Active Problems:   CAP (community acquired pneumonia)   Polymyalgia rheumatica syndrome (HCC)   Hypokalemia   Steroid-induced psychosis, with hallucinations (HCC)   Hypertension   Hypercholesterolemia   Leukocytosis   Type 2 diabetes mellitus with cardiac complication (HCC)   Chronic diastolic CHF (congestive heart failure) (HCC)   Hypomagnesemia   COPD (chronic obstructive pulmonary disease) (HCC)   CAD (coronary artery disease)   PAF (paroxysmal atrial fibrillation) (HCC)    Assessment & Plan: Acute metabolic encephalopathy Etiology is not clear.  Urinalysis negative.  VBG showed CO2 38.  MRI of the brain is negative for acute issues.  Differential diagnosis includes delirium, side effects of  steroid, early dementia with behavior change and psychosis.  Vitamin B12 and TSH are normal. Chest x-ray on admission did show a lingular opacity concerning for pneumonia, opacity in the medial right lung base -- Treat pneumonia as outlined --Delirium precautions --Psychiatry consulted --Seroquel was been started at bedtime and as needed during the day -- Patient has not required any as needed Seroquel or Haldol since first day of admission.  Psych recommended continuing just Seroquel as needed.  Hypertension Continue home metoprolol IV hydralazine as needed  6/5: BP's elevated, increased metoprolol 25 >> 50 mg BID  Hypercholesterolemia Daughter reported patient stopped taking Lipitor.  Previously on Lipitor 80 mg.  Polymyalgia rheumatica syndrome (HCC) Follows with rheumatology for this and RA, Dr. Allena KatzPatel.  Note reviewed from follow-up visit on 02/10/2022 when prednisone had been decreased to 2.5 mg daily, methotrexate 12.5 mg weekly was continued along with folic acid 1 mg daily.  Admitting hospitalist was told by daughter that she was not sure if patient was taking 10 or 15 mg prednisone, and patient not taking methotrexate. -- Reduce prednisone to 2.5 mg daily -- Close follow-up with rheumatology -- Will advised to continue methotrexate or discuss with rheumatologist   Leukocytosis WBC 17.1 on admission.  No signs or symptoms of infection, afebrile.   Suspect due to home prednisone. --Monitor CBC -- Monitor clinically for signs or symptoms of infection  Type 2 diabetes mellitus with cardiac complication (HCC) - Recent A1c 8.0, poorly controlled.   --Hold metformin --Sliding scale NovoLog  Chronic diastolic CHF (congestive heart failure) (HCC) Echo on 11/01/2021 - EF 60 to 65%.   Patient appears euvolemic and well compensated. --Monitor volume status --  Continue metoprolol -- Appears not on diuretics  COPD (chronic obstructive pulmonary disease) (HCC) Stable -- Continue  bronchodilators  CAD (coronary artery disease) Stable no active chest pain. Status post stent placement. Continue aspirin  PAF (paroxysmal atrial fibrillation) (HCC) Daughter reported history of atrial fibrillation, not on anticoagulation.   EKG on admission showed A-fib.   Heart rates are controlled. -stopped telemetry as it is significantly worsening her agitation --Continue metoprolol - increased from 25 --> to 50 mg bid to improve rate control and BP   Steroid-induced psychosis, with hallucinations (HCC) Patient likely has mild dementia, has been on prednisone for quite some time for RA and polymyalgia rheumatica which can lead to psychosis esp in elderly with cognitive decline.  It seems there may be confusion with her current dose as daughter on admission reported to the admitting hospitalist that she was not sure whether patient should be taking 10 or 15 mg.  On chart review of last rheumatology follow-up 4/13, prednisone was reduced from 5 down to 2.5 mg. -- Continue prednisone 2.5 mg daily with breakfast -- Consider stopping prednisone if possible  Hypokalemia resolved Monitor BMP, replace K as needed. Monitor Mg level as well.  CAP (community acquired pneumonia) Patient presented with altered mental status and hallucinations, family reported worsening confusion over several days.  Patient is poor historian but no mention of respiratory symptoms by family. Chest x-ray on admission showed a lingular opacity concerning for pneumonia, medial right lung base opacity as well. This could be etiology of patient's encephalopathy. -- Started IV Rocephin and Zithromax  -- Transition to oral Augmentin and Zithromax today  Patient's mental status drastically improved over the last 48 hours.  Now at baseline.  Hypomagnesemia Mg repleted      Discharge Instructions  Discharge Instructions     Diet - low sodium heart healthy   Complete by: As directed    Increase activity  slowly   Complete by: As directed        Allergies as of 04/06/2022       Reactions   Tramadol Itching, Nausea And Vomiting        Medication List     TAKE these medications    acetaminophen 500 MG tablet Commonly known as: TYLENOL Take 500-1,000 mg by mouth every 6 (six) hours as needed for mild pain or moderate pain.   amoxicillin-clavulanate 875-125 MG tablet Commonly known as: AUGMENTIN Take 1 tablet by mouth every 12 (twelve) hours.   aspirin 81 MG tablet Take 81 mg by mouth daily.   atorvastatin 80 MG tablet Commonly known as: LIPITOR TAKE 1 TABLET BY MOUTH ONCE DAILY AT  6PM   azithromycin 500 MG tablet Commonly known as: ZITHROMAX Take 1 tablet (500 mg total) by mouth daily. Start taking on: April 07, 2022   fluticasone-salmeterol 250-50 MCG/ACT Aepb Commonly known as: ADVAIR Inhale 1 puff into the lungs in the morning and at bedtime.   folic acid 1 MG tablet Commonly known as: FOLVITE Take 1 mg by mouth daily.   ipratropium-albuterol 0.5-2.5 (3) MG/3ML Soln Commonly known as: DUONEB Take 3 mLs by nebulization 2 (two) times daily. What changed:  when to take this reasons to take this   metFORMIN 500 MG tablet Commonly known as: GLUCOPHAGE TAKE 1 TABLET BY MOUTH TWICE DAILY WITH A MEAL   metoprolol tartrate 50 MG tablet Commonly known as: LOPRESSOR Take 1 tablet (50 mg total) by mouth 2 (two) times daily. What changed:  medication strength how  much to take   nitroGLYCERIN 0.4 MG SL tablet Commonly known as: NITROSTAT Place 1 tablet (0.4 mg total) under the tongue every 5 (five) minutes as needed for chest pain.   predniSONE 2.5 MG tablet Commonly known as: DELTASONE Take 2.5 mg by mouth daily with breakfast.   PreserVision AREDS 2 Caps Take 1 capsule by mouth 2 (two) times daily.   ProAir HFA 108 (90 Base) MCG/ACT inhaler Generic drug: albuterol Inhale 2 puffs into the lungs every 4 (four) hours as needed for wheezing or shortness of  breath.   QUEtiapine 25 MG tablet Commonly known as: SEROQUEL Take 0.5 tablets (12.5 mg total) by mouth 2 (two) times daily as needed (hallucinations with anxiety, agitation).   Vitamin D (Ergocalciferol) 1.25 MG (50000 UNIT) Caps capsule Commonly known as: DRISDOL Take 50,000 Units by mouth every Friday.        Diet Orders (From admission, onward)     Start     Ordered   04/06/22 0000  Diet - low sodium heart healthy        04/06/22 1258   04/02/22 1507  Diet heart healthy/carb modified Room service appropriate? Yes; Fluid consistency: Thin  Diet effective now       Question Answer Comment  Diet-HS Snack? Nothing   Room service appropriate? Yes   Fluid consistency: Thin      04/02/22 1507               Allergies  Allergen Reactions   Tramadol Itching and Nausea And Vomiting     Subjective: pt feeling good today, would like to go home, daughter states mental state is much better today compared to admission. No other concerns/complaints    Discharge Exam: Vitals:   04/05/22 2016 04/06/22 0739  BP: (!) 157/68 122/90  Pulse: 71 78  Resp:  16  Temp:  97.8 F (36.6 C)  SpO2:  97%   Vitals:   04/05/22 2015 04/05/22 2016 04/06/22 0500 04/06/22 0739  BP: (!) 137/40 (!) 157/68  122/90  Pulse: 71 71  78  Resp: 18   16  Temp: 98.5 F (36.9 C)   97.8 F (36.6 C)  TempSrc: Oral     SpO2: 98%   97%  Weight:   57.8 kg   Height:        General: Pt is alert, awake, not in acute distress Cardiovascular: RRR, S1/S2 +, no rubs, no gallops Respiratory: CTA bilaterally, no wheezing, no rhonchi Abdominal: Soft, NT, ND, bowel sounds + Extremities: no edema, no cyanosis     The results of significant diagnostics from this hospitalization (including imaging, microbiology, ancillary and laboratory) are listed below for reference.     Microbiology: Recent Results (from the past 240 hour(s))  SARS Coronavirus 2 by RT PCR (hospital order, performed in [facility]  hospital lab) *cepheid single result test* Anterior Nasal Swab     Status: None   Collection Time: 04/02/22  2:01 PM   Specimen: Anterior Nasal Swab  Result Value Ref Range Status   SARS Coronavirus 2 by RT PCR NEGATIVE NEGATIVE Final    Comment: (NOTE) SARS-CoV-2 target nucleic acids are NOT DETECTED.  The SARS-CoV-2 RNA is generally detectable in upper and lower respiratory specimens during the acute phase of infection. The lowest concentration of SARS-CoV-2 viral copies this assay can detect is 250 copies / mL. A negative result does not preclude SARS-CoV-2 infection and should not be used as the sole basis for treatment or  other patient management decisions.  A negative result may occur with improper specimen collection / handling, submission of specimen other than nasopharyngeal swab, presence of viral mutation(s) within the areas targeted by this assay, and inadequate number of viral copies (<250 copies / mL). A negative result must be combined with clinical observations, patient history, and epidemiological information.  Fact Sheet for Patients:   RoadLapTop.co.za  Fact Sheet for Healthcare Providers: http://kim-miller.com/  This test is not yet approved or  cleared by the [place] FDA and has been authorized for detection and/or diagnosis of SARS-CoV-2 by FDA under an Emergency Use Authorization (EUA).  This EUA will remain in effect (meaning this test can be used) for the duration of the COVID-19 declaration under Section 564(b)(1) of the Act, 21 U.S.C. section 360bbb-3(b)(1), unless the authorization is terminated or revoked sooner.  Performed at [facility], [address] [place], [place] [address]      Labs: BNP (last 3 results) Recent Labs    09/15/21 0914 10/31/21 0931 01/25/22 1128  BNP 180.9* 210.4* 123.5*   Basic Metabolic Panel: Recent Labs  Lab 04/01/22 1120 04/02/22 1212  04/03/22 0605 04/04/22 0453 04/05/22 0503  NA 139 144 139 143 141  K 3.4* 3.5 3.1* 3.2* 3.9  CL 102 104 105 109 111  CO2 GLUCOSE 253* 154* 149* 152* 134*  BUN CREATININE 0.68 1.08* 0.55 0.65 0.64  CALCIUM 9.1 9.1 8.5* 8.4* 8.6*  MG  --   --   --  1.5* 2.0   Liver Function Tests: Recent Labs  Lab 04/02/22 1212  AST 41  ALT 33  ALKPHOS 80  BILITOT 0.8  PROT 7.9  ALBUMIN 4.6   No results for input(s): LIPASE, AMYLASE in the last 168 hours. No results for input(s): AMMONIA in the last 168 hours. CBC: Recent Labs  Lab 04/01/22 1120 04/02/22 1212 04/03/22 0605 04/04/22 0453 04/05/22 0503  WBC 14.8* 17.4* 13.5* 11.6* 9.7  NEUTROABS 11.7*  --   --   --   --   HGB 12.7 13.7 12.6 12.3 12.5  HCT 38.7 42.0 38.1 37.5 38.5  MCV 90.4 90.5 88.8 89.7 91.2  PLT 280 326 274 276 267   Cardiac Enzymes: No results for input(s): CKTOTAL, CKMB, CKMBINDEX, TROPONINI in the last 168 hours. BNP: Invalid input(s): POCBNP CBG: Recent Labs  Lab 04/05/22 1215 04/05/22 1608 04/05/22 2047 04/06/22 0741 04/06/22 1118  GLUCAP 165* 127* 131* 159* 197*   D-Dimer No results for input(s): DDIMER in the last 72 hours. Hgb A1c No results for input(s): HGBA1C in the last 72 hours. Lipid Profile No results for input(s): CHOL, HDL, LDLCALC, TRIG, CHOLHDL, LDLDIRECT in the last 72 hours. Thyroid function studies No results for input(s): TSH, T4TOTAL, T3FREE, THYROIDAB in the last 72 hours.  Invalid input(s): FREET3 Anemia work up No results for input(s): VITAMINB12, FOLATE, FERRITIN, TIBC, IRON, RETICCTPCT in the last 72 hours. Urinalysis    Component Value Date/Time   COLORURINE AMBER (A) 04/02/2022 1302   APPEARANCEUR HAZY (A) 04/02/2022 1302   APPEARANCEUR Clear 02/18/2013 2004   LABSPEC 1.041 (H) 04/02/2022 1302   LABSPEC 1.024 02/18/2013 2004   PHURINE 5.0 04/02/2022 1302   GLUCOSEU NEGATIVE 04/02/2022 1302   GLUCOSEU NEGATIVE 01/28/2015 0810    HGBUR NEGATIVE 04/02/2022 1302   BILIRUBINUR NEGATIVE 04/02/2022 1302   BILIRUBINUR neg 01/16/2015 1437   BILIRUBINUR Negative 02/18/2013 2004   KETONESUR 5 (A)  04/02/2022 1302   PROTEINUR 100 (A) 04/02/2022 1302   UROBILINOGEN 0.2 01/28/2015 0810   NITRITE NEGATIVE 04/02/2022 1302   LEUKOCYTESUR NEGATIVE 04/02/2022 1302   LEUKOCYTESUR Trace 02/18/2013 2004   Sepsis Labs Invalid input(s): PROCALCITONIN,  WBC,  LACTICIDVEN Microbiology Recent Results (from the past 240 hour(s))  SARS Coronavirus 2 by RT PCR (hospital order, performed in [facility] hospital lab) *cepheid single result test* Anterior Nasal Swab     Status: None   Collection Time: 04/02/22  2:01 PM   Specimen: Anterior Nasal Swab  Result Value Ref Range Status   SARS Coronavirus 2 by RT PCR NEGATIVE NEGATIVE Final    Comment: (NOTE) SARS-CoV-2 target nucleic acids are NOT DETECTED.  The SARS-CoV-2 RNA is generally detectable in upper and lower respiratory specimens during the acute phase of infection. The lowest concentration of SARS-CoV-2 viral copies this assay can detect is 250 copies / mL. A negative result does not preclude SARS-CoV-2 infection and should not be used as the sole basis for treatment or other patient management decisions.  A negative result may occur with improper specimen collection / handling, submission of specimen other than nasopharyngeal swab, presence of viral mutation(s) within the areas targeted by this assay, and inadequate number of viral copies (<250 copies / mL). A negative result must be combined with clinical observations, patient history, and epidemiological information.  Fact Sheet for Patients:   RoadLapTop.co.za  Fact Sheet for Healthcare Providers: http://kim-miller.com/  This test is not yet approved or  cleared by the [place] FDA and has been authorized for detection and/or diagnosis of SARS-CoV-2 by FDA under an  Emergency Use Authorization (EUA).  This EUA will remain in effect (meaning this test can be used) for the duration of the COVID-19 declaration under Section 564(b)(1) of the Act, 21 U.S.C. section 360bbb-3(b)(1), unless the authorization is terminated or revoked sooner.  Performed at [facility], [address] [place], [place] [address]    Imaging DG Chest 2 View  Result Date: 04/02/2022 CLINICAL DATA:  Confusion. EXAM: CHEST - 2 VIEW COMPARISON:  Mar 07, 2022 FINDINGS: Stable mild cardiomegaly. The hila and mediastinum are unchanged. No pneumothorax. Opacity in the lingula, and perhaps the right middle lobe. No other acute abnormalities. IMPRESSION: Lingular opacity concerning for pneumonia. Opacity in the medial right lung base on the frontal view could represent a right middle lobe opacity versus vascular crowding. Recommend short-term follow-up imaging to ensure resolution. Electronically Signed   By: Gerome Sam III M.D.   On: 04/02/2022 15:05   MR BRAIN WO CONTRAST  Result Date: 04/02/2022 CLINICAL DATA:  Mental status change, unknown cause. Visual hallucinations. EXAM: MRI HEAD WITHOUT CONTRAST TECHNIQUE: Multiplanar, multiecho pulse sequences of the brain and surrounding structures were obtained without intravenous contrast. COMPARISON:  CT studies yesterday. FINDINGS: Brain: Diffusion imaging does not show any acute or subacute infarction. There is generalized age related atrophy. Old small vessel infarctions affect the cerebellum. Mild chronic small-vessel ischemic change of the pons and thalami. Moderate chronic small-vessel ischemic change of the cerebral hemispheric white matter. No mass, hemorrhage, hydrocephalus or extra-axial collection. Vascular: Major vessels at the base of the brain show flow. Skull and upper cervical spine: Negative Sinuses/Orbits: Clear/normal Other: None IMPRESSION: No acute MRI finding. Age related atrophy. 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 Chronic small-vessel ischemic changes of the thalami and hemispheric white matter. Electronically Signed   By: Paulina Fusi M.D.   On: 04/02/2022 16:31  Time coordinating discharge: Over 30 minutes  SIGNED:  Sunnie Nielsen DO [facility]

## 2022-04-07 ENCOUNTER — Telehealth: Payer: Self-pay

## 2022-04-07 ENCOUNTER — Other Ambulatory Visit: Payer: Self-pay | Admitting: Internal Medicine

## 2022-04-07 DIAGNOSIS — I2510 Atherosclerotic heart disease of native coronary artery without angina pectoris: Secondary | ICD-10-CM | POA: Diagnosis not present

## 2022-04-07 DIAGNOSIS — I480 Paroxysmal atrial fibrillation: Secondary | ICD-10-CM | POA: Diagnosis not present

## 2022-04-07 DIAGNOSIS — Z7951 Long term (current) use of inhaled steroids: Secondary | ICD-10-CM | POA: Diagnosis not present

## 2022-04-07 DIAGNOSIS — J440 Chronic obstructive pulmonary disease with acute lower respiratory infection: Secondary | ICD-10-CM | POA: Diagnosis not present

## 2022-04-07 DIAGNOSIS — Z7984 Long term (current) use of oral hypoglycemic drugs: Secondary | ICD-10-CM | POA: Diagnosis not present

## 2022-04-07 DIAGNOSIS — E7800 Pure hypercholesterolemia, unspecified: Secondary | ICD-10-CM | POA: Diagnosis not present

## 2022-04-07 DIAGNOSIS — Z7982 Long term (current) use of aspirin: Secondary | ICD-10-CM | POA: Diagnosis not present

## 2022-04-07 DIAGNOSIS — I110 Hypertensive heart disease with heart failure: Secondary | ICD-10-CM | POA: Diagnosis not present

## 2022-04-07 DIAGNOSIS — Z7952 Long term (current) use of systemic steroids: Secondary | ICD-10-CM | POA: Diagnosis not present

## 2022-04-07 DIAGNOSIS — M8580 Other specified disorders of bone density and structure, unspecified site: Secondary | ICD-10-CM | POA: Diagnosis not present

## 2022-04-07 DIAGNOSIS — F330 Major depressive disorder, recurrent, mild: Secondary | ICD-10-CM

## 2022-04-07 DIAGNOSIS — M069 Rheumatoid arthritis, unspecified: Secondary | ICD-10-CM | POA: Diagnosis not present

## 2022-04-07 DIAGNOSIS — E1169 Type 2 diabetes mellitus with other specified complication: Secondary | ICD-10-CM | POA: Diagnosis not present

## 2022-04-07 DIAGNOSIS — I5032 Chronic diastolic (congestive) heart failure: Secondary | ICD-10-CM | POA: Diagnosis not present

## 2022-04-07 DIAGNOSIS — Z9181 History of falling: Secondary | ICD-10-CM | POA: Diagnosis not present

## 2022-04-07 DIAGNOSIS — M353 Polymyalgia rheumatica: Secondary | ICD-10-CM | POA: Diagnosis not present

## 2022-04-07 DIAGNOSIS — I252 Old myocardial infarction: Secondary | ICD-10-CM | POA: Diagnosis not present

## 2022-04-07 NOTE — Telephone Encounter (Signed)
Transition Care Management Follow-up Telephone Call Date of discharge and from where:  How have you been since you were released from the hospital? Information provided by patient and sister, HIPAA compliant. Doing better over all. Tires easily in the afternoon. Cries a lot. Taking all medication as directed. Intermittent hallucinations, but not too bad. HH started today and it helped alot. Staying hydrated. Good appetite. Denies confusion, N/V/D, pain, dizziness and all other alarming symptoms. Any questions or concerns? No  Items Reviewed: Did the pt receive and understand the discharge instructions provided? Yes  Medications obtained and verified? Yes  Any new allergies since your discharge? No  Dietary orders reviewed? Yes Do you have support at home? Yes   Home Care and Equipment/Supplies: Were home health services ordered? Yes, in progress.   Functional Questionnaire: (I = Independent and D = Dependent) ADLs: Family assist as needed  Eating- I  Maintaining continence- I  Transferring/Ambulation- Walker  Managing Meds- Daughter and Sister assist  Follow up appointments reviewed:  PCP Hospital f/u appt confirmed? Yes  Scheduled to see PCP on 04/18/22 @ 11:00. Check BMP ans Mg levels Specialist Hospital f/u appt confirmed? N/a  Are transportation arrangements needed? No  If their condition worsens, is the pt aware to call PCP or go to the Emergency Dept.? Yes Was the patient provided with contact information for the PCP's office or ED? Yes Was to pt encouraged to call back with questions or concerns? Yes

## 2022-04-08 ENCOUNTER — Telehealth: Payer: Self-pay | Admitting: Internal Medicine

## 2022-04-08 DIAGNOSIS — I252 Old myocardial infarction: Secondary | ICD-10-CM | POA: Diagnosis not present

## 2022-04-08 DIAGNOSIS — J440 Chronic obstructive pulmonary disease with acute lower respiratory infection: Secondary | ICD-10-CM | POA: Diagnosis not present

## 2022-04-08 DIAGNOSIS — M8580 Other specified disorders of bone density and structure, unspecified site: Secondary | ICD-10-CM | POA: Diagnosis not present

## 2022-04-08 DIAGNOSIS — I2510 Atherosclerotic heart disease of native coronary artery without angina pectoris: Secondary | ICD-10-CM | POA: Diagnosis not present

## 2022-04-08 DIAGNOSIS — I5032 Chronic diastolic (congestive) heart failure: Secondary | ICD-10-CM | POA: Diagnosis not present

## 2022-04-08 DIAGNOSIS — Z7984 Long term (current) use of oral hypoglycemic drugs: Secondary | ICD-10-CM | POA: Diagnosis not present

## 2022-04-08 DIAGNOSIS — Z7952 Long term (current) use of systemic steroids: Secondary | ICD-10-CM | POA: Diagnosis not present

## 2022-04-08 DIAGNOSIS — M069 Rheumatoid arthritis, unspecified: Secondary | ICD-10-CM | POA: Diagnosis not present

## 2022-04-08 DIAGNOSIS — E1169 Type 2 diabetes mellitus with other specified complication: Secondary | ICD-10-CM | POA: Diagnosis not present

## 2022-04-08 DIAGNOSIS — Z7982 Long term (current) use of aspirin: Secondary | ICD-10-CM | POA: Diagnosis not present

## 2022-04-08 DIAGNOSIS — Z7951 Long term (current) use of inhaled steroids: Secondary | ICD-10-CM | POA: Diagnosis not present

## 2022-04-08 DIAGNOSIS — I110 Hypertensive heart disease with heart failure: Secondary | ICD-10-CM | POA: Diagnosis not present

## 2022-04-08 DIAGNOSIS — Z9181 History of falling: Secondary | ICD-10-CM | POA: Diagnosis not present

## 2022-04-08 DIAGNOSIS — M353 Polymyalgia rheumatica: Secondary | ICD-10-CM | POA: Diagnosis not present

## 2022-04-08 DIAGNOSIS — I480 Paroxysmal atrial fibrillation: Secondary | ICD-10-CM | POA: Diagnosis not present

## 2022-04-08 DIAGNOSIS — E7800 Pure hypercholesterolemia, unspecified: Secondary | ICD-10-CM | POA: Diagnosis not present

## 2022-04-08 NOTE — Telephone Encounter (Signed)
She was recently hospitalized and had issues with hallucinations and some depression.  Was evaluated by psychiatry.  Can you see if would be agreeable for outpt f/u with psychiatry.  I can place order for referral.  Thanks

## 2022-04-08 NOTE — Telephone Encounter (Signed)
Patient is willing to see Psychiatry she would like to stay in is area for transportation reasons.

## 2022-04-08 NOTE — Telephone Encounter (Signed)
Ok for both

## 2022-04-08 NOTE — Telephone Encounter (Signed)
Spoke with Noreene LarssonJill at [employer] Gave verbal ok for both meals and social work

## 2022-04-08 NOTE — Telephone Encounter (Signed)
Sandi from [facility], [phone] Option #2. Requsting patient to receive meals, ( Mom's meals through insurance), [employer].  Patient was seen by [facility] today, she is requesting a PRN Social Work order.

## 2022-04-09 NOTE — Telephone Encounter (Signed)
Order placed for psych referral.   

## 2022-04-09 NOTE — Addendum Note (Signed)
Addended by: Charm Barges on: 04/09/2022 09:48 AM   Modules accepted: Orders

## 2022-04-11 DIAGNOSIS — Z7984 Long term (current) use of oral hypoglycemic drugs: Secondary | ICD-10-CM | POA: Diagnosis not present

## 2022-04-11 DIAGNOSIS — J440 Chronic obstructive pulmonary disease with acute lower respiratory infection: Secondary | ICD-10-CM | POA: Diagnosis not present

## 2022-04-11 DIAGNOSIS — I252 Old myocardial infarction: Secondary | ICD-10-CM | POA: Diagnosis not present

## 2022-04-11 DIAGNOSIS — Z7982 Long term (current) use of aspirin: Secondary | ICD-10-CM | POA: Diagnosis not present

## 2022-04-11 DIAGNOSIS — M069 Rheumatoid arthritis, unspecified: Secondary | ICD-10-CM | POA: Diagnosis not present

## 2022-04-11 DIAGNOSIS — I480 Paroxysmal atrial fibrillation: Secondary | ICD-10-CM | POA: Diagnosis not present

## 2022-04-11 DIAGNOSIS — Z7952 Long term (current) use of systemic steroids: Secondary | ICD-10-CM | POA: Diagnosis not present

## 2022-04-11 DIAGNOSIS — Z9181 History of falling: Secondary | ICD-10-CM | POA: Diagnosis not present

## 2022-04-11 DIAGNOSIS — Z7951 Long term (current) use of inhaled steroids: Secondary | ICD-10-CM | POA: Diagnosis not present

## 2022-04-11 DIAGNOSIS — M8580 Other specified disorders of bone density and structure, unspecified site: Secondary | ICD-10-CM | POA: Diagnosis not present

## 2022-04-11 DIAGNOSIS — E7800 Pure hypercholesterolemia, unspecified: Secondary | ICD-10-CM | POA: Diagnosis not present

## 2022-04-11 DIAGNOSIS — M353 Polymyalgia rheumatica: Secondary | ICD-10-CM | POA: Diagnosis not present

## 2022-04-11 DIAGNOSIS — I2510 Atherosclerotic heart disease of native coronary artery without angina pectoris: Secondary | ICD-10-CM | POA: Diagnosis not present

## 2022-04-11 DIAGNOSIS — E1169 Type 2 diabetes mellitus with other specified complication: Secondary | ICD-10-CM | POA: Diagnosis not present

## 2022-04-11 DIAGNOSIS — I5032 Chronic diastolic (congestive) heart failure: Secondary | ICD-10-CM | POA: Diagnosis not present

## 2022-04-11 DIAGNOSIS — I110 Hypertensive heart disease with heart failure: Secondary | ICD-10-CM | POA: Diagnosis not present

## 2022-04-12 DIAGNOSIS — I5032 Chronic diastolic (congestive) heart failure: Secondary | ICD-10-CM | POA: Diagnosis not present

## 2022-04-12 DIAGNOSIS — M8580 Other specified disorders of bone density and structure, unspecified site: Secondary | ICD-10-CM | POA: Diagnosis not present

## 2022-04-12 DIAGNOSIS — Z7982 Long term (current) use of aspirin: Secondary | ICD-10-CM | POA: Diagnosis not present

## 2022-04-12 DIAGNOSIS — Z7951 Long term (current) use of inhaled steroids: Secondary | ICD-10-CM | POA: Diagnosis not present

## 2022-04-12 DIAGNOSIS — I252 Old myocardial infarction: Secondary | ICD-10-CM | POA: Diagnosis not present

## 2022-04-12 DIAGNOSIS — I110 Hypertensive heart disease with heart failure: Secondary | ICD-10-CM | POA: Diagnosis not present

## 2022-04-12 DIAGNOSIS — Z7952 Long term (current) use of systemic steroids: Secondary | ICD-10-CM | POA: Diagnosis not present

## 2022-04-12 DIAGNOSIS — Z9181 History of falling: Secondary | ICD-10-CM | POA: Diagnosis not present

## 2022-04-12 DIAGNOSIS — Z7984 Long term (current) use of oral hypoglycemic drugs: Secondary | ICD-10-CM | POA: Diagnosis not present

## 2022-04-12 DIAGNOSIS — E7800 Pure hypercholesterolemia, unspecified: Secondary | ICD-10-CM | POA: Diagnosis not present

## 2022-04-12 DIAGNOSIS — M353 Polymyalgia rheumatica: Secondary | ICD-10-CM | POA: Diagnosis not present

## 2022-04-12 DIAGNOSIS — J440 Chronic obstructive pulmonary disease with acute lower respiratory infection: Secondary | ICD-10-CM | POA: Diagnosis not present

## 2022-04-12 DIAGNOSIS — I480 Paroxysmal atrial fibrillation: Secondary | ICD-10-CM | POA: Diagnosis not present

## 2022-04-12 DIAGNOSIS — E1169 Type 2 diabetes mellitus with other specified complication: Secondary | ICD-10-CM | POA: Diagnosis not present

## 2022-04-12 DIAGNOSIS — I2510 Atherosclerotic heart disease of native coronary artery without angina pectoris: Secondary | ICD-10-CM | POA: Diagnosis not present

## 2022-04-12 DIAGNOSIS — M069 Rheumatoid arthritis, unspecified: Secondary | ICD-10-CM | POA: Diagnosis not present

## 2022-04-13 NOTE — Telephone Encounter (Signed)
Esther from [employer] called stating pt does not have need for OT services

## 2022-04-14 DIAGNOSIS — Z7984 Long term (current) use of oral hypoglycemic drugs: Secondary | ICD-10-CM | POA: Diagnosis not present

## 2022-04-14 DIAGNOSIS — Z9181 History of falling: Secondary | ICD-10-CM | POA: Diagnosis not present

## 2022-04-14 DIAGNOSIS — I480 Paroxysmal atrial fibrillation: Secondary | ICD-10-CM | POA: Diagnosis not present

## 2022-04-14 DIAGNOSIS — Z7951 Long term (current) use of inhaled steroids: Secondary | ICD-10-CM | POA: Diagnosis not present

## 2022-04-14 DIAGNOSIS — M353 Polymyalgia rheumatica: Secondary | ICD-10-CM | POA: Diagnosis not present

## 2022-04-14 DIAGNOSIS — I252 Old myocardial infarction: Secondary | ICD-10-CM | POA: Diagnosis not present

## 2022-04-14 DIAGNOSIS — E1169 Type 2 diabetes mellitus with other specified complication: Secondary | ICD-10-CM | POA: Diagnosis not present

## 2022-04-14 DIAGNOSIS — Z7952 Long term (current) use of systemic steroids: Secondary | ICD-10-CM | POA: Diagnosis not present

## 2022-04-14 DIAGNOSIS — E7800 Pure hypercholesterolemia, unspecified: Secondary | ICD-10-CM | POA: Diagnosis not present

## 2022-04-14 DIAGNOSIS — J440 Chronic obstructive pulmonary disease with acute lower respiratory infection: Secondary | ICD-10-CM | POA: Diagnosis not present

## 2022-04-14 DIAGNOSIS — I2510 Atherosclerotic heart disease of native coronary artery without angina pectoris: Secondary | ICD-10-CM | POA: Diagnosis not present

## 2022-04-14 DIAGNOSIS — Z7982 Long term (current) use of aspirin: Secondary | ICD-10-CM | POA: Diagnosis not present

## 2022-04-14 DIAGNOSIS — I5032 Chronic diastolic (congestive) heart failure: Secondary | ICD-10-CM | POA: Diagnosis not present

## 2022-04-14 DIAGNOSIS — I110 Hypertensive heart disease with heart failure: Secondary | ICD-10-CM | POA: Diagnosis not present

## 2022-04-14 DIAGNOSIS — M8580 Other specified disorders of bone density and structure, unspecified site: Secondary | ICD-10-CM | POA: Diagnosis not present

## 2022-04-14 DIAGNOSIS — M069 Rheumatoid arthritis, unspecified: Secondary | ICD-10-CM | POA: Diagnosis not present

## 2022-04-15 ENCOUNTER — Ambulatory Visit (INDEPENDENT_AMBULATORY_CARE_PROVIDER_SITE_OTHER): Payer: Medicare Other | Admitting: Internal Medicine

## 2022-04-15 ENCOUNTER — Ambulatory Visit (INDEPENDENT_AMBULATORY_CARE_PROVIDER_SITE_OTHER): Payer: Medicare Other

## 2022-04-15 ENCOUNTER — Encounter: Payer: Self-pay | Admitting: Internal Medicine

## 2022-04-15 VITALS — BP 130/68 | HR 58 | Temp 98.7°F | Resp 16 | Ht 61.0 in | Wt 126.6 lb

## 2022-04-15 DIAGNOSIS — R9389 Abnormal findings on diagnostic imaging of other specified body structures: Secondary | ICD-10-CM

## 2022-04-15 DIAGNOSIS — I5032 Chronic diastolic (congestive) heart failure: Secondary | ICD-10-CM | POA: Diagnosis not present

## 2022-04-15 DIAGNOSIS — M810 Age-related osteoporosis without current pathological fracture: Secondary | ICD-10-CM | POA: Diagnosis not present

## 2022-04-15 DIAGNOSIS — J449 Chronic obstructive pulmonary disease, unspecified: Secondary | ICD-10-CM

## 2022-04-15 DIAGNOSIS — I25118 Atherosclerotic heart disease of native coronary artery with other forms of angina pectoris: Secondary | ICD-10-CM

## 2022-04-15 DIAGNOSIS — E7800 Pure hypercholesterolemia, unspecified: Secondary | ICD-10-CM

## 2022-04-15 DIAGNOSIS — J189 Pneumonia, unspecified organism: Secondary | ICD-10-CM

## 2022-04-15 DIAGNOSIS — R413 Other amnesia: Secondary | ICD-10-CM

## 2022-04-15 DIAGNOSIS — I10 Essential (primary) hypertension: Secondary | ICD-10-CM | POA: Diagnosis not present

## 2022-04-15 DIAGNOSIS — I480 Paroxysmal atrial fibrillation: Secondary | ICD-10-CM | POA: Diagnosis not present

## 2022-04-15 DIAGNOSIS — E538 Deficiency of other specified B group vitamins: Secondary | ICD-10-CM

## 2022-04-15 DIAGNOSIS — E1159 Type 2 diabetes mellitus with other circulatory complications: Secondary | ICD-10-CM

## 2022-04-15 DIAGNOSIS — I2510 Atherosclerotic heart disease of native coronary artery without angina pectoris: Secondary | ICD-10-CM

## 2022-04-15 DIAGNOSIS — M353 Polymyalgia rheumatica: Secondary | ICD-10-CM

## 2022-04-15 DIAGNOSIS — D509 Iron deficiency anemia, unspecified: Secondary | ICD-10-CM

## 2022-04-15 DIAGNOSIS — D72829 Elevated white blood cell count, unspecified: Secondary | ICD-10-CM

## 2022-04-15 DIAGNOSIS — R918 Other nonspecific abnormal finding of lung field: Secondary | ICD-10-CM | POA: Diagnosis not present

## 2022-04-15 MED ORDER — CYANOCOBALAMIN 1000 MCG/ML IJ SOLN
1000.0000 ug | Freq: Once | INTRAMUSCULAR | Status: AC
  Administered 2022-04-15: 1000 ug via INTRAMUSCULAR

## 2022-04-15 NOTE — Progress Notes (Unsigned)
[name], female   DOB: 22-Mar-1933, 88 y.o.   MRN: [id number]   Subjective:    [name], female    DOB: 05/19/1933, 88 y.o.   MRN: [id number]  This visit occurred during the SARS-CoV-2 public health emergency.  Safety protocols were in place, including screening questions prior to the visit, additional usage of staff PPE, and extensive cleaning of exam room while observing appropriate contact time as indicated for disinfecting solutions.   Patient here for  Chief Complaint  Patient presents with   Follow-up    Hospital follow up    .   HPI    Past Medical History:  Diagnosis Date   (HFpEF) heart failure with preserved ejection fraction (HCC)    a. TTE 12/19: EF 55-60%, probable HK of the mid apical anterior septal myocardium, Gr1DD, mild AI, mildly dilated LA; b.07/2020 Echo: EF 60-65%, no rwma, Gr2 DD. Nl RV fxn. Mildly dil LA. Mild MR.   Asthma    CAD (coronary artery disease)    a. 09/2018 NSTEMI/PCI: LM min irregs, mLAD 95 (PCI/DES), mLAD-2 60%, LCx mild diff dzs, RCA min irregs; b. 03/2020 MV: EF>65%, no ischemia/scar; c. 07/2020 Cath: LM min irregs, LAD 30p, 70m, 90d, D1/2 min irregs, LCX diff dzs throughout, OM1/2/3 mild dzs, RCA 30p, RPDA/RPAV min irrges. EF 55-65%.   CHF (congestive heart failure) (HCC)    Diabetes mellitus (HCC)    Hypercholesterolemia    Hypertension    Myocardial infarction (HCC)    Osteopenia    Palpitations    a. 04/2020 Zio: Avg HR 75. 429 SVT episodes, longest 19 secs @ 133. Occas PACs (3.2%). Rare PVCs (<1%).   Polymyalgia rheumatica syndrome (HCC)    Reactive airway disease    Severe sepsis (HCC)    Past Surgical History:  Procedure Laterality Date   ABDOMINAL HYSTERECTOMY  1981   prolapse and bleeding, ovaries not removed   BREAST EXCISIONAL BIOPSY Right    CHOLECYSTECTOMY N/A 09/02/2019   Procedure: LAPAROSCOPIC CHOLECYSTECTOMY WITH INTRAOPERATIVE CHOLANGIOGRAM;  Surgeon: Henrene Dodge, MD;   Location: [facility];  Service: General;  Laterality: N/A;   CORONARY STENT INTERVENTION N/A 10/08/2018   Procedure: CORONARY STENT INTERVENTION;  Surgeon: Iran Ouch, MD;  Location: [facility];  Service: Cardiovascular;  Laterality: N/A;   ENDOSCOPIC RETROGRADE CHOLANGIOPANCREATOGRAPHY (ERCP) WITH PROPOFOL N/A 08/08/2019   Procedure: ENDOSCOPIC RETROGRADE CHOLANGIOPANCREATOGRAPHY (ERCP) WITH PROPOFOL;  Surgeon: Midge Minium, MD;  Location: [facility];  Service: Endoscopy;  Laterality: N/A;   LEFT HEART CATH AND CORONARY ANGIOGRAPHY N/A 10/08/2018   Procedure: LEFT HEART CATH AND CORONARY ANGIOGRAPHY;  Surgeon: Iran Ouch, MD;  Location: [facility];  Service: Cardiovascular;  Laterality: N/A;   LEFT HEART CATH AND CORONARY ANGIOGRAPHY N/A 08/10/2020   Procedure: LEFT HEART CATH AND CORONARY ANGIOGRAPHY possible percutaneous intervention;  Surgeon: Iran Ouch, MD;  Location: [facility];  Service: Cardiovascular;  Laterality: N/A;   UMBILICAL HERNIA REPAIR  7/94   Family History  Problem Relation Age of Onset   Arthritis Mother    Heart disease Mother    Heart attack Father    Throat cancer Sister    Parkinson's disease Sister    COPD Brother    COPD Brother    Social History   Socioeconomic History   Marital status: Widowed    Spouse name: Not on file   Number of children: 3   Years of education: Not  on file   Highest education level: Not on file  Occupational History   Not on file  Tobacco Use   Smoking status: Never   Smokeless tobacco: Never  Vaping Use   Vaping Use: Never used  Substance and Sexual Activity   Alcohol use: No    Alcohol/week: 0.0 standard drinks of alcohol   Drug use: No   Sexual activity: Not Currently  Other Topics Concern   Not on file  Social History Narrative   No smoking; no alcohol; in [place]; worked in Designer, fashion/clothing. Lives by self in [place]. Does all of her own housework. Dtr does food shopping  for her.   Social Determinants of Health   Financial Resource Strain: Low Risk  (06/04/2021)   Overall Financial Resource Strain (CARDIA)    Difficulty of Paying Living Expenses: Not hard at all  Food Insecurity: No Food Insecurity (06/04/2021)   Hunger Vital Sign    Worried About Running Out of Food in the Last Year: Never true    Ran Out of Food in the Last Year: Never true  Transportation Needs: No Transportation Needs (06/04/2021)   PRAPARE - Administrator, Civil Service (Medical): No    Lack of Transportation (Non-Medical): No  Physical Activity: Insufficiently Active (01/07/2019)   Exercise Vital Sign    Days of Exercise per Week: 2 days    Minutes of Exercise per Session: 10 min  Stress: No Stress Concern Present (04/27/2021)   [employer] of Occupational Health - Occupational Stress Questionnaire    Feeling of Stress : Not at all  Social Connections: Moderately Integrated (04/27/2021)   Social Connection and Isolation Panel [NHANES]    Frequency of Communication with Friends and Family: More than three times a week    Frequency of Social Gatherings with Friends and Family: More than three times a week    Attends Religious Services: More than 4 times per year    Active Member of [employer] or Organizations: Yes    Attends Banker Meetings: Not on file    Marital Status: Widowed     Review of Systems     Objective:     BP 130/68 (BP Location: Left Arm, Patient Position: Sitting, Cuff Size: Small)   Pulse (!) 58   Temp 98.7 F (37.1 C) (Temporal)   Resp 16   Ht  (1.549 m)   Wt 126 lb 9.6 oz (57.4 kg)   SpO2 97%   BMI 23.92 kg/m  Wt Readings from Last 3 Encounters:  04/15/22 126 lb 9.6 oz (57.4 kg)  04/06/22 127 lb 6.8 oz (57.8 kg)  04/01/22 130 lb (59 kg)    Physical Exam   Outpatient Encounter Medications as of 04/15/2022  Medication Sig   acetaminophen (TYLENOL) 500 MG tablet Take 500-1,000 mg by mouth every 6 (six) hours as  needed for mild pain or moderate pain.   aspirin 81 MG tablet Take 81 mg by mouth daily.   atorvastatin (LIPITOR) 80 MG tablet TAKE 1 TABLET BY MOUTH ONCE DAILY AT  6PM   fluticasone-salmeterol (ADVAIR) 250-50 MCG/ACT AEPB Inhale 1 puff into the lungs in the morning and at bedtime.   folic acid (FOLVITE) 1 MG tablet Take 1 mg by mouth daily.   ipratropium-albuterol (DUONEB) 0.5-2.5 (3) MG/3ML SOLN Take 3 mLs by nebulization 2 (two) times daily. (Patient taking differently: Take 3 mLs by nebulization 2 (two) times daily as needed (shortness of breath or wheezing).)   metFORMIN (  GLUCOPHAGE) 500 MG tablet TAKE 1 TABLET BY MOUTH TWICE DAILY WITH A MEAL   metoprolol tartrate (LOPRESSOR) 50 MG tablet Take 1 tablet (50 mg total) by mouth 2 (two) times daily.   Multiple Vitamins-Minerals (PRESERVISION AREDS 2) CAPS Take 1 capsule by mouth 2 (two) times daily.   nitroGLYCERIN (NITROSTAT) 0.4 MG SL tablet Place 1 tablet (0.4 mg total) under the tongue every 5 (five) minutes as needed for chest pain.   PROAIR HFA 108 (90 Base) MCG/ACT inhaler Inhale 2 puffs into the lungs every 4 (four) hours as needed for wheezing or shortness of breath.   Vitamin D, Ergocalciferol, (DRISDOL) 50000 units CAPS capsule Take 50,000 Units by mouth every Friday.   amoxicillin-clavulanate (AUGMENTIN) 875-125 MG tablet Take 1 tablet by mouth every 12 (twelve) hours. (Patient not taking: Reported on 04/15/2022)   azithromycin (ZITHROMAX) 500 MG tablet Take 1 tablet (500 mg total) by mouth daily. (Patient not taking: Reported on 04/15/2022)   predniSONE (DELTASONE) 2.5 MG tablet Take 2.5 mg by mouth daily with breakfast.   QUEtiapine (SEROQUEL) 25 MG tablet Take 0.5 tablets (12.5 mg total) by mouth 2 (two) times daily as needed (hallucinations with anxiety, agitation).   No facility-administered encounter medications on file as of 04/15/2022.     Lab Results  Component Value Date   WBC 9.7 04/05/2022   HGB 12.5 04/05/2022   HCT  38.5 04/05/2022   PLT 267 04/05/2022   GLUCOSE 134 (H) 04/05/2022   CHOL 112 01/26/2022   TRIG 137 01/26/2022   HDL 35 (L) 01/26/2022   LDLDIRECT 136.0 07/03/2020   LDLCALC 50 01/26/2022   ALT 33 04/02/2022   AST 41 04/02/2022   NA 141 04/05/2022   K 3.9 04/05/2022   CL 111 04/05/2022   CREATININE 0.64 04/05/2022   BUN 16 04/05/2022   CO2 23 04/05/2022   TSH 1.112 04/02/2022   INR 1.1 01/25/2022   HGBA1C 8.0 (H) 01/25/2022   MICROALBUR 1.1 07/23/2021    MR BRAIN WO CONTRAST  Result Date: 04/02/2022 CLINICAL DATA:  Mental status change, unknown cause. Visual hallucinations. EXAM: MRI HEAD WITHOUT CONTRAST TECHNIQUE: Multiplanar, multiecho pulse sequences of the brain and surrounding structures were obtained without intravenous contrast. COMPARISON:  CT studies yesterday. FINDINGS: Brain: Diffusion imaging does not show any acute or subacute infarction. There is generalized age related atrophy. Old small vessel infarctions affect the cerebellum. Mild chronic small-vessel ischemic change of the pons and thalami. Moderate chronic small-vessel ischemic change of the cerebral hemispheric white matter. No mass, hemorrhage, hydrocephalus or extra-axial collection. Vascular: Major vessels at the base of the brain show flow. Skull and upper cervical spine: Negative Sinuses/Orbits: Clear/normal Other: None IMPRESSION: No acute MRI finding. Age related atrophy. 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 Chronic small-vessel ischemic changes of the thalami and hemispheric white matter. Electronically Signed   By: Paulina Fusi M.D.   On: 04/02/2022 16:31   DG Chest 2 View  Result Date: 04/02/2022 CLINICAL DATA:  Confusion. EXAM: CHEST - 2 VIEW COMPARISON:  Mar 07, 2022 FINDINGS: Stable mild cardiomegaly. The hila and mediastinum are unchanged. No pneumothorax. Opacity in the lingula, and perhaps the right middle lobe. No other acute abnormalities. IMPRESSION: Lingular opacity concerning for  pneumonia. Opacity in the medial right lung base on the frontal view could represent a right middle lobe opacity versus vascular crowding. Recommend short-term follow-up imaging to ensure resolution. Electronically Signed   By: Gerome Sam III M.D.   On: 04/02/2022  15:05       Assessment & Plan:   Problem List Items Addressed This Visit   None Visit Diagnoses     B12 deficiency    -  Primary        Dale Empire, MD

## 2022-04-16 ENCOUNTER — Encounter: Payer: Self-pay | Admitting: Internal Medicine

## 2022-04-16 NOTE — Assessment & Plan Note (Signed)
ECHO = 11/01/21 - EF 60-65%.  No evidence of volume overload on exam today.  On metoprolol.  Breathing stable.  Follow.  

## 2022-04-16 NOTE — Assessment & Plan Note (Signed)
Known CAD s/p PCI/DES - mid LAD.  Taking metoprolol.  Has NTG if needed.  No pain or tightness with her activity at home.   Is followed by cardiology.  Continue risk factor modification.

## 2022-04-16 NOTE — Assessment & Plan Note (Signed)
Just admitted and cxr concerning for pneumonia.  Treated with abx.  Recheck cxr today to confirm if clear.  Breathing and mental status improved.

## 2022-04-16 NOTE — Assessment & Plan Note (Signed)
On alendronate.  Completed 5 years - 02/2022.

## 2022-04-16 NOTE — Assessment & Plan Note (Signed)
Continue current inhalers.  Breathing stable.   

## 2022-04-16 NOTE — Assessment & Plan Note (Signed)
Has seen hematology.  Recent cbc - hgb wnl.

## 2022-04-16 NOTE — Assessment & Plan Note (Signed)
Per note, EKG during admission showed afib.  Appears to be in SR today.  On metoprolol.  Question of P waves - EKG.  Reviewed.  Hold on further anticoagulation.  Continues on aspirin.  Will have cardiology review and evaluate - question of afib and need for further anticoagulation.

## 2022-04-16 NOTE — Assessment & Plan Note (Signed)
Continue metoprolol.  Follow pressures.   

## 2022-04-16 NOTE — Assessment & Plan Note (Signed)
Persistent.  F/u with neurology.

## 2022-04-16 NOTE — Assessment & Plan Note (Signed)
Wbc count prior to discharge wnl.  Follow.

## 2022-04-16 NOTE — Assessment & Plan Note (Signed)
Recently admitted with presumed pneumonia.  Treated.  Breathing improved.  Recheck cxr.

## 2022-04-16 NOTE — Assessment & Plan Note (Signed)
Followed by Dr Allena Katz.  Apparently had been on an increased amount of prednisone prior to admission.  On 2.5mg  q day now.  Continue f/u with rheumatology. Continue MTX.

## 2022-04-16 NOTE — Assessment & Plan Note (Signed)
On metformin.  Low carb diet.  Has been on prednisone.  Dose - 2.36m now.  Follow met b and a1c.

## 2022-04-16 NOTE — Assessment & Plan Note (Signed)
Lipitor.  Follow lipid panel and liver function tests.  

## 2022-04-16 NOTE — Assessment & Plan Note (Signed)
S/p stent placement.  Continue risk factor modification.

## 2022-04-18 ENCOUNTER — Inpatient Hospital Stay: Payer: Medicare Other | Admitting: Internal Medicine

## 2022-04-18 DIAGNOSIS — I252 Old myocardial infarction: Secondary | ICD-10-CM | POA: Diagnosis not present

## 2022-04-18 DIAGNOSIS — Z7952 Long term (current) use of systemic steroids: Secondary | ICD-10-CM | POA: Diagnosis not present

## 2022-04-18 DIAGNOSIS — M069 Rheumatoid arthritis, unspecified: Secondary | ICD-10-CM | POA: Diagnosis not present

## 2022-04-18 DIAGNOSIS — E7800 Pure hypercholesterolemia, unspecified: Secondary | ICD-10-CM | POA: Diagnosis not present

## 2022-04-18 DIAGNOSIS — Z7951 Long term (current) use of inhaled steroids: Secondary | ICD-10-CM | POA: Diagnosis not present

## 2022-04-18 DIAGNOSIS — Z9181 History of falling: Secondary | ICD-10-CM | POA: Diagnosis not present

## 2022-04-18 DIAGNOSIS — E1169 Type 2 diabetes mellitus with other specified complication: Secondary | ICD-10-CM | POA: Diagnosis not present

## 2022-04-18 DIAGNOSIS — J440 Chronic obstructive pulmonary disease with acute lower respiratory infection: Secondary | ICD-10-CM | POA: Diagnosis not present

## 2022-04-18 DIAGNOSIS — I110 Hypertensive heart disease with heart failure: Secondary | ICD-10-CM | POA: Diagnosis not present

## 2022-04-18 DIAGNOSIS — I2510 Atherosclerotic heart disease of native coronary artery without angina pectoris: Secondary | ICD-10-CM | POA: Diagnosis not present

## 2022-04-18 DIAGNOSIS — M353 Polymyalgia rheumatica: Secondary | ICD-10-CM | POA: Diagnosis not present

## 2022-04-18 DIAGNOSIS — Z7984 Long term (current) use of oral hypoglycemic drugs: Secondary | ICD-10-CM | POA: Diagnosis not present

## 2022-04-18 DIAGNOSIS — G9341 Metabolic encephalopathy: Secondary | ICD-10-CM | POA: Diagnosis not present

## 2022-04-18 DIAGNOSIS — I5032 Chronic diastolic (congestive) heart failure: Secondary | ICD-10-CM | POA: Diagnosis not present

## 2022-04-18 DIAGNOSIS — M8580 Other specified disorders of bone density and structure, unspecified site: Secondary | ICD-10-CM | POA: Diagnosis not present

## 2022-04-18 DIAGNOSIS — I480 Paroxysmal atrial fibrillation: Secondary | ICD-10-CM | POA: Diagnosis not present

## 2022-04-18 DIAGNOSIS — Z7982 Long term (current) use of aspirin: Secondary | ICD-10-CM | POA: Diagnosis not present

## 2022-04-19 DIAGNOSIS — I5032 Chronic diastolic (congestive) heart failure: Secondary | ICD-10-CM | POA: Diagnosis not present

## 2022-04-19 DIAGNOSIS — Z7952 Long term (current) use of systemic steroids: Secondary | ICD-10-CM

## 2022-04-19 DIAGNOSIS — Z7982 Long term (current) use of aspirin: Secondary | ICD-10-CM

## 2022-04-19 DIAGNOSIS — I480 Paroxysmal atrial fibrillation: Secondary | ICD-10-CM | POA: Diagnosis not present

## 2022-04-19 DIAGNOSIS — Z7984 Long term (current) use of oral hypoglycemic drugs: Secondary | ICD-10-CM

## 2022-04-19 DIAGNOSIS — M069 Rheumatoid arthritis, unspecified: Secondary | ICD-10-CM | POA: Diagnosis not present

## 2022-04-19 DIAGNOSIS — I110 Hypertensive heart disease with heart failure: Secondary | ICD-10-CM | POA: Diagnosis not present

## 2022-04-19 DIAGNOSIS — M353 Polymyalgia rheumatica: Secondary | ICD-10-CM | POA: Diagnosis not present

## 2022-04-19 DIAGNOSIS — E1169 Type 2 diabetes mellitus with other specified complication: Secondary | ICD-10-CM | POA: Diagnosis not present

## 2022-04-19 DIAGNOSIS — Z9181 History of falling: Secondary | ICD-10-CM

## 2022-04-19 DIAGNOSIS — I252 Old myocardial infarction: Secondary | ICD-10-CM | POA: Diagnosis not present

## 2022-04-19 DIAGNOSIS — G9341 Metabolic encephalopathy: Secondary | ICD-10-CM

## 2022-04-19 DIAGNOSIS — E7800 Pure hypercholesterolemia, unspecified: Secondary | ICD-10-CM | POA: Diagnosis not present

## 2022-04-19 DIAGNOSIS — J189 Pneumonia, unspecified organism: Secondary | ICD-10-CM | POA: Diagnosis not present

## 2022-04-19 DIAGNOSIS — I2510 Atherosclerotic heart disease of native coronary artery without angina pectoris: Secondary | ICD-10-CM | POA: Diagnosis not present

## 2022-04-19 DIAGNOSIS — Z7951 Long term (current) use of inhaled steroids: Secondary | ICD-10-CM

## 2022-04-19 DIAGNOSIS — M8580 Other specified disorders of bone density and structure, unspecified site: Secondary | ICD-10-CM | POA: Diagnosis not present

## 2022-04-19 DIAGNOSIS — J440 Chronic obstructive pulmonary disease with acute lower respiratory infection: Secondary | ICD-10-CM | POA: Diagnosis not present

## 2022-04-21 DIAGNOSIS — I110 Hypertensive heart disease with heart failure: Secondary | ICD-10-CM | POA: Diagnosis not present

## 2022-04-21 DIAGNOSIS — Z7984 Long term (current) use of oral hypoglycemic drugs: Secondary | ICD-10-CM | POA: Diagnosis not present

## 2022-04-21 DIAGNOSIS — Z7952 Long term (current) use of systemic steroids: Secondary | ICD-10-CM | POA: Diagnosis not present

## 2022-04-21 DIAGNOSIS — J440 Chronic obstructive pulmonary disease with acute lower respiratory infection: Secondary | ICD-10-CM | POA: Diagnosis not present

## 2022-04-21 DIAGNOSIS — I2510 Atherosclerotic heart disease of native coronary artery without angina pectoris: Secondary | ICD-10-CM | POA: Diagnosis not present

## 2022-04-21 DIAGNOSIS — E7800 Pure hypercholesterolemia, unspecified: Secondary | ICD-10-CM | POA: Diagnosis not present

## 2022-04-21 DIAGNOSIS — Z9181 History of falling: Secondary | ICD-10-CM | POA: Diagnosis not present

## 2022-04-21 DIAGNOSIS — E1169 Type 2 diabetes mellitus with other specified complication: Secondary | ICD-10-CM | POA: Diagnosis not present

## 2022-04-21 DIAGNOSIS — Z7982 Long term (current) use of aspirin: Secondary | ICD-10-CM | POA: Diagnosis not present

## 2022-04-21 DIAGNOSIS — M353 Polymyalgia rheumatica: Secondary | ICD-10-CM | POA: Diagnosis not present

## 2022-04-21 DIAGNOSIS — G9341 Metabolic encephalopathy: Secondary | ICD-10-CM | POA: Diagnosis not present

## 2022-04-21 DIAGNOSIS — M8580 Other specified disorders of bone density and structure, unspecified site: Secondary | ICD-10-CM | POA: Diagnosis not present

## 2022-04-21 DIAGNOSIS — M069 Rheumatoid arthritis, unspecified: Secondary | ICD-10-CM | POA: Diagnosis not present

## 2022-04-21 DIAGNOSIS — I5032 Chronic diastolic (congestive) heart failure: Secondary | ICD-10-CM | POA: Diagnosis not present

## 2022-04-21 DIAGNOSIS — I252 Old myocardial infarction: Secondary | ICD-10-CM | POA: Diagnosis not present

## 2022-04-21 DIAGNOSIS — Z7951 Long term (current) use of inhaled steroids: Secondary | ICD-10-CM | POA: Diagnosis not present

## 2022-04-21 DIAGNOSIS — I480 Paroxysmal atrial fibrillation: Secondary | ICD-10-CM | POA: Diagnosis not present

## 2022-04-22 DIAGNOSIS — Z7982 Long term (current) use of aspirin: Secondary | ICD-10-CM | POA: Diagnosis not present

## 2022-04-22 DIAGNOSIS — I252 Old myocardial infarction: Secondary | ICD-10-CM | POA: Diagnosis not present

## 2022-04-22 DIAGNOSIS — I5032 Chronic diastolic (congestive) heart failure: Secondary | ICD-10-CM | POA: Diagnosis not present

## 2022-04-22 DIAGNOSIS — G9341 Metabolic encephalopathy: Secondary | ICD-10-CM | POA: Diagnosis not present

## 2022-04-22 DIAGNOSIS — M8580 Other specified disorders of bone density and structure, unspecified site: Secondary | ICD-10-CM | POA: Diagnosis not present

## 2022-04-22 DIAGNOSIS — Z7951 Long term (current) use of inhaled steroids: Secondary | ICD-10-CM | POA: Diagnosis not present

## 2022-04-22 DIAGNOSIS — M069 Rheumatoid arthritis, unspecified: Secondary | ICD-10-CM | POA: Diagnosis not present

## 2022-04-22 DIAGNOSIS — E1169 Type 2 diabetes mellitus with other specified complication: Secondary | ICD-10-CM | POA: Diagnosis not present

## 2022-04-22 DIAGNOSIS — Z9181 History of falling: Secondary | ICD-10-CM | POA: Diagnosis not present

## 2022-04-22 DIAGNOSIS — I110 Hypertensive heart disease with heart failure: Secondary | ICD-10-CM | POA: Diagnosis not present

## 2022-04-22 DIAGNOSIS — J440 Chronic obstructive pulmonary disease with acute lower respiratory infection: Secondary | ICD-10-CM | POA: Diagnosis not present

## 2022-04-22 DIAGNOSIS — Z7952 Long term (current) use of systemic steroids: Secondary | ICD-10-CM | POA: Diagnosis not present

## 2022-04-22 DIAGNOSIS — I2510 Atherosclerotic heart disease of native coronary artery without angina pectoris: Secondary | ICD-10-CM | POA: Diagnosis not present

## 2022-04-22 DIAGNOSIS — I480 Paroxysmal atrial fibrillation: Secondary | ICD-10-CM | POA: Diagnosis not present

## 2022-04-22 DIAGNOSIS — Z7984 Long term (current) use of oral hypoglycemic drugs: Secondary | ICD-10-CM | POA: Diagnosis not present

## 2022-04-22 DIAGNOSIS — E7800 Pure hypercholesterolemia, unspecified: Secondary | ICD-10-CM | POA: Diagnosis not present

## 2022-04-22 DIAGNOSIS — M353 Polymyalgia rheumatica: Secondary | ICD-10-CM | POA: Diagnosis not present

## 2022-04-25 DIAGNOSIS — Z7951 Long term (current) use of inhaled steroids: Secondary | ICD-10-CM | POA: Diagnosis not present

## 2022-04-25 DIAGNOSIS — I2510 Atherosclerotic heart disease of native coronary artery without angina pectoris: Secondary | ICD-10-CM | POA: Diagnosis not present

## 2022-04-25 DIAGNOSIS — I5032 Chronic diastolic (congestive) heart failure: Secondary | ICD-10-CM | POA: Diagnosis not present

## 2022-04-25 DIAGNOSIS — E7800 Pure hypercholesterolemia, unspecified: Secondary | ICD-10-CM | POA: Diagnosis not present

## 2022-04-25 DIAGNOSIS — Z9181 History of falling: Secondary | ICD-10-CM | POA: Diagnosis not present

## 2022-04-25 DIAGNOSIS — Z7984 Long term (current) use of oral hypoglycemic drugs: Secondary | ICD-10-CM | POA: Diagnosis not present

## 2022-04-25 DIAGNOSIS — Z7982 Long term (current) use of aspirin: Secondary | ICD-10-CM | POA: Diagnosis not present

## 2022-04-25 DIAGNOSIS — M353 Polymyalgia rheumatica: Secondary | ICD-10-CM | POA: Diagnosis not present

## 2022-04-25 DIAGNOSIS — Z7952 Long term (current) use of systemic steroids: Secondary | ICD-10-CM | POA: Diagnosis not present

## 2022-04-25 DIAGNOSIS — E1169 Type 2 diabetes mellitus with other specified complication: Secondary | ICD-10-CM | POA: Diagnosis not present

## 2022-04-25 DIAGNOSIS — M8580 Other specified disorders of bone density and structure, unspecified site: Secondary | ICD-10-CM | POA: Diagnosis not present

## 2022-04-25 DIAGNOSIS — I480 Paroxysmal atrial fibrillation: Secondary | ICD-10-CM | POA: Diagnosis not present

## 2022-04-25 DIAGNOSIS — I252 Old myocardial infarction: Secondary | ICD-10-CM | POA: Diagnosis not present

## 2022-04-25 DIAGNOSIS — I110 Hypertensive heart disease with heart failure: Secondary | ICD-10-CM | POA: Diagnosis not present

## 2022-04-25 DIAGNOSIS — M069 Rheumatoid arthritis, unspecified: Secondary | ICD-10-CM | POA: Diagnosis not present

## 2022-04-25 DIAGNOSIS — G9341 Metabolic encephalopathy: Secondary | ICD-10-CM | POA: Diagnosis not present

## 2022-04-25 DIAGNOSIS — J440 Chronic obstructive pulmonary disease with acute lower respiratory infection: Secondary | ICD-10-CM | POA: Diagnosis not present

## 2022-04-26 DIAGNOSIS — Z7982 Long term (current) use of aspirin: Secondary | ICD-10-CM | POA: Diagnosis not present

## 2022-04-26 DIAGNOSIS — Z9181 History of falling: Secondary | ICD-10-CM | POA: Diagnosis not present

## 2022-04-26 DIAGNOSIS — I5032 Chronic diastolic (congestive) heart failure: Secondary | ICD-10-CM | POA: Diagnosis not present

## 2022-04-26 DIAGNOSIS — M069 Rheumatoid arthritis, unspecified: Secondary | ICD-10-CM | POA: Diagnosis not present

## 2022-04-26 DIAGNOSIS — M8580 Other specified disorders of bone density and structure, unspecified site: Secondary | ICD-10-CM | POA: Diagnosis not present

## 2022-04-26 DIAGNOSIS — J440 Chronic obstructive pulmonary disease with acute lower respiratory infection: Secondary | ICD-10-CM | POA: Diagnosis not present

## 2022-04-26 DIAGNOSIS — E1169 Type 2 diabetes mellitus with other specified complication: Secondary | ICD-10-CM | POA: Diagnosis not present

## 2022-04-26 DIAGNOSIS — Z7984 Long term (current) use of oral hypoglycemic drugs: Secondary | ICD-10-CM | POA: Diagnosis not present

## 2022-04-26 DIAGNOSIS — Z7952 Long term (current) use of systemic steroids: Secondary | ICD-10-CM | POA: Diagnosis not present

## 2022-04-26 DIAGNOSIS — I2510 Atherosclerotic heart disease of native coronary artery without angina pectoris: Secondary | ICD-10-CM | POA: Diagnosis not present

## 2022-04-26 DIAGNOSIS — I252 Old myocardial infarction: Secondary | ICD-10-CM | POA: Diagnosis not present

## 2022-04-26 DIAGNOSIS — I110 Hypertensive heart disease with heart failure: Secondary | ICD-10-CM | POA: Diagnosis not present

## 2022-04-26 DIAGNOSIS — I480 Paroxysmal atrial fibrillation: Secondary | ICD-10-CM | POA: Diagnosis not present

## 2022-04-26 DIAGNOSIS — Z7951 Long term (current) use of inhaled steroids: Secondary | ICD-10-CM | POA: Diagnosis not present

## 2022-04-26 DIAGNOSIS — M353 Polymyalgia rheumatica: Secondary | ICD-10-CM | POA: Diagnosis not present

## 2022-04-26 DIAGNOSIS — G9341 Metabolic encephalopathy: Secondary | ICD-10-CM | POA: Diagnosis not present

## 2022-04-26 DIAGNOSIS — E7800 Pure hypercholesterolemia, unspecified: Secondary | ICD-10-CM | POA: Diagnosis not present

## 2022-04-27 ENCOUNTER — Telehealth: Payer: Self-pay | Admitting: Internal Medicine

## 2022-04-27 DIAGNOSIS — M0579 Rheumatoid arthritis with rheumatoid factor of multiple sites without organ or systems involvement: Secondary | ICD-10-CM | POA: Diagnosis not present

## 2022-04-27 DIAGNOSIS — M353 Polymyalgia rheumatica: Secondary | ICD-10-CM | POA: Diagnosis not present

## 2022-04-27 DIAGNOSIS — Z7960 Long term (current) use of unspecified immunomodulators and immunosuppressants: Secondary | ICD-10-CM | POA: Diagnosis not present

## 2022-04-27 NOTE — Telephone Encounter (Signed)
Copied from CRM [id number]. Topic: Medicare AWV >> Apr 27, 2022  9:43 AM Payton Doughty wrote: Reason for CRM: Attempted to schedule AWV. Unable to LVM.  Will try at later time.

## 2022-04-28 DIAGNOSIS — Z7951 Long term (current) use of inhaled steroids: Secondary | ICD-10-CM | POA: Diagnosis not present

## 2022-04-28 DIAGNOSIS — I2510 Atherosclerotic heart disease of native coronary artery without angina pectoris: Secondary | ICD-10-CM | POA: Diagnosis not present

## 2022-04-28 DIAGNOSIS — M8580 Other specified disorders of bone density and structure, unspecified site: Secondary | ICD-10-CM | POA: Diagnosis not present

## 2022-04-28 DIAGNOSIS — E7800 Pure hypercholesterolemia, unspecified: Secondary | ICD-10-CM | POA: Diagnosis not present

## 2022-04-28 DIAGNOSIS — E1169 Type 2 diabetes mellitus with other specified complication: Secondary | ICD-10-CM | POA: Diagnosis not present

## 2022-04-28 DIAGNOSIS — J440 Chronic obstructive pulmonary disease with acute lower respiratory infection: Secondary | ICD-10-CM | POA: Diagnosis not present

## 2022-04-28 DIAGNOSIS — Z9181 History of falling: Secondary | ICD-10-CM | POA: Diagnosis not present

## 2022-04-28 DIAGNOSIS — I480 Paroxysmal atrial fibrillation: Secondary | ICD-10-CM | POA: Diagnosis not present

## 2022-04-28 DIAGNOSIS — I110 Hypertensive heart disease with heart failure: Secondary | ICD-10-CM | POA: Diagnosis not present

## 2022-04-28 DIAGNOSIS — Z7984 Long term (current) use of oral hypoglycemic drugs: Secondary | ICD-10-CM | POA: Diagnosis not present

## 2022-04-28 DIAGNOSIS — I252 Old myocardial infarction: Secondary | ICD-10-CM | POA: Diagnosis not present

## 2022-04-28 DIAGNOSIS — Z7952 Long term (current) use of systemic steroids: Secondary | ICD-10-CM | POA: Diagnosis not present

## 2022-04-28 DIAGNOSIS — I5032 Chronic diastolic (congestive) heart failure: Secondary | ICD-10-CM | POA: Diagnosis not present

## 2022-04-28 DIAGNOSIS — M353 Polymyalgia rheumatica: Secondary | ICD-10-CM | POA: Diagnosis not present

## 2022-04-28 DIAGNOSIS — G9341 Metabolic encephalopathy: Secondary | ICD-10-CM | POA: Diagnosis not present

## 2022-04-28 DIAGNOSIS — M069 Rheumatoid arthritis, unspecified: Secondary | ICD-10-CM | POA: Diagnosis not present

## 2022-04-28 DIAGNOSIS — Z7982 Long term (current) use of aspirin: Secondary | ICD-10-CM | POA: Diagnosis not present

## 2022-04-29 DIAGNOSIS — Z7984 Long term (current) use of oral hypoglycemic drugs: Secondary | ICD-10-CM | POA: Diagnosis not present

## 2022-04-29 DIAGNOSIS — Z7952 Long term (current) use of systemic steroids: Secondary | ICD-10-CM | POA: Diagnosis not present

## 2022-04-29 DIAGNOSIS — Z7951 Long term (current) use of inhaled steroids: Secondary | ICD-10-CM | POA: Diagnosis not present

## 2022-04-29 DIAGNOSIS — Z9181 History of falling: Secondary | ICD-10-CM | POA: Diagnosis not present

## 2022-04-29 DIAGNOSIS — I110 Hypertensive heart disease with heart failure: Secondary | ICD-10-CM | POA: Diagnosis not present

## 2022-04-29 DIAGNOSIS — Z7982 Long term (current) use of aspirin: Secondary | ICD-10-CM | POA: Diagnosis not present

## 2022-04-29 DIAGNOSIS — I252 Old myocardial infarction: Secondary | ICD-10-CM | POA: Diagnosis not present

## 2022-04-29 DIAGNOSIS — G9341 Metabolic encephalopathy: Secondary | ICD-10-CM | POA: Diagnosis not present

## 2022-04-29 DIAGNOSIS — E7800 Pure hypercholesterolemia, unspecified: Secondary | ICD-10-CM | POA: Diagnosis not present

## 2022-04-29 DIAGNOSIS — M353 Polymyalgia rheumatica: Secondary | ICD-10-CM | POA: Diagnosis not present

## 2022-04-29 DIAGNOSIS — J440 Chronic obstructive pulmonary disease with acute lower respiratory infection: Secondary | ICD-10-CM | POA: Diagnosis not present

## 2022-04-29 DIAGNOSIS — I5032 Chronic diastolic (congestive) heart failure: Secondary | ICD-10-CM | POA: Diagnosis not present

## 2022-04-29 DIAGNOSIS — I480 Paroxysmal atrial fibrillation: Secondary | ICD-10-CM | POA: Diagnosis not present

## 2022-04-29 DIAGNOSIS — M8580 Other specified disorders of bone density and structure, unspecified site: Secondary | ICD-10-CM | POA: Diagnosis not present

## 2022-04-29 DIAGNOSIS — I2510 Atherosclerotic heart disease of native coronary artery without angina pectoris: Secondary | ICD-10-CM | POA: Diagnosis not present

## 2022-04-29 DIAGNOSIS — M069 Rheumatoid arthritis, unspecified: Secondary | ICD-10-CM | POA: Diagnosis not present

## 2022-04-29 DIAGNOSIS — E1169 Type 2 diabetes mellitus with other specified complication: Secondary | ICD-10-CM | POA: Diagnosis not present

## 2022-05-02 DIAGNOSIS — Z7952 Long term (current) use of systemic steroids: Secondary | ICD-10-CM | POA: Diagnosis not present

## 2022-05-02 DIAGNOSIS — G9341 Metabolic encephalopathy: Secondary | ICD-10-CM | POA: Diagnosis not present

## 2022-05-02 DIAGNOSIS — Z7951 Long term (current) use of inhaled steroids: Secondary | ICD-10-CM | POA: Diagnosis not present

## 2022-05-02 DIAGNOSIS — M353 Polymyalgia rheumatica: Secondary | ICD-10-CM | POA: Diagnosis not present

## 2022-05-02 DIAGNOSIS — M069 Rheumatoid arthritis, unspecified: Secondary | ICD-10-CM | POA: Diagnosis not present

## 2022-05-02 DIAGNOSIS — E7800 Pure hypercholesterolemia, unspecified: Secondary | ICD-10-CM | POA: Diagnosis not present

## 2022-05-02 DIAGNOSIS — M8580 Other specified disorders of bone density and structure, unspecified site: Secondary | ICD-10-CM | POA: Diagnosis not present

## 2022-05-02 DIAGNOSIS — Z9181 History of falling: Secondary | ICD-10-CM | POA: Diagnosis not present

## 2022-05-02 DIAGNOSIS — J440 Chronic obstructive pulmonary disease with acute lower respiratory infection: Secondary | ICD-10-CM | POA: Diagnosis not present

## 2022-05-02 DIAGNOSIS — I5032 Chronic diastolic (congestive) heart failure: Secondary | ICD-10-CM | POA: Diagnosis not present

## 2022-05-02 DIAGNOSIS — I480 Paroxysmal atrial fibrillation: Secondary | ICD-10-CM | POA: Diagnosis not present

## 2022-05-02 DIAGNOSIS — I110 Hypertensive heart disease with heart failure: Secondary | ICD-10-CM | POA: Diagnosis not present

## 2022-05-02 DIAGNOSIS — I2510 Atherosclerotic heart disease of native coronary artery without angina pectoris: Secondary | ICD-10-CM | POA: Diagnosis not present

## 2022-05-02 DIAGNOSIS — Z7984 Long term (current) use of oral hypoglycemic drugs: Secondary | ICD-10-CM | POA: Diagnosis not present

## 2022-05-02 DIAGNOSIS — E1169 Type 2 diabetes mellitus with other specified complication: Secondary | ICD-10-CM | POA: Diagnosis not present

## 2022-05-02 DIAGNOSIS — Z7982 Long term (current) use of aspirin: Secondary | ICD-10-CM | POA: Diagnosis not present

## 2022-05-02 DIAGNOSIS — I252 Old myocardial infarction: Secondary | ICD-10-CM | POA: Diagnosis not present

## 2022-05-04 ENCOUNTER — Telehealth: Payer: 59

## 2022-05-05 ENCOUNTER — Inpatient Hospital Stay: Payer: Medicare Other | Admitting: Internal Medicine

## 2022-05-05 DIAGNOSIS — J440 Chronic obstructive pulmonary disease with acute lower respiratory infection: Secondary | ICD-10-CM | POA: Diagnosis not present

## 2022-05-05 DIAGNOSIS — I2510 Atherosclerotic heart disease of native coronary artery without angina pectoris: Secondary | ICD-10-CM | POA: Diagnosis not present

## 2022-05-05 DIAGNOSIS — I5032 Chronic diastolic (congestive) heart failure: Secondary | ICD-10-CM | POA: Diagnosis not present

## 2022-05-05 DIAGNOSIS — Z7984 Long term (current) use of oral hypoglycemic drugs: Secondary | ICD-10-CM | POA: Diagnosis not present

## 2022-05-05 DIAGNOSIS — G9341 Metabolic encephalopathy: Secondary | ICD-10-CM | POA: Diagnosis not present

## 2022-05-05 DIAGNOSIS — M069 Rheumatoid arthritis, unspecified: Secondary | ICD-10-CM | POA: Diagnosis not present

## 2022-05-05 DIAGNOSIS — I480 Paroxysmal atrial fibrillation: Secondary | ICD-10-CM | POA: Diagnosis not present

## 2022-05-05 DIAGNOSIS — I252 Old myocardial infarction: Secondary | ICD-10-CM | POA: Diagnosis not present

## 2022-05-05 DIAGNOSIS — Z7952 Long term (current) use of systemic steroids: Secondary | ICD-10-CM | POA: Diagnosis not present

## 2022-05-05 DIAGNOSIS — M353 Polymyalgia rheumatica: Secondary | ICD-10-CM | POA: Diagnosis not present

## 2022-05-05 DIAGNOSIS — Z9181 History of falling: Secondary | ICD-10-CM | POA: Diagnosis not present

## 2022-05-05 DIAGNOSIS — I110 Hypertensive heart disease with heart failure: Secondary | ICD-10-CM | POA: Diagnosis not present

## 2022-05-05 DIAGNOSIS — E7800 Pure hypercholesterolemia, unspecified: Secondary | ICD-10-CM | POA: Diagnosis not present

## 2022-05-05 DIAGNOSIS — M8580 Other specified disorders of bone density and structure, unspecified site: Secondary | ICD-10-CM | POA: Diagnosis not present

## 2022-05-05 DIAGNOSIS — Z7982 Long term (current) use of aspirin: Secondary | ICD-10-CM | POA: Diagnosis not present

## 2022-05-05 DIAGNOSIS — E1169 Type 2 diabetes mellitus with other specified complication: Secondary | ICD-10-CM | POA: Diagnosis not present

## 2022-05-05 DIAGNOSIS — Z7951 Long term (current) use of inhaled steroids: Secondary | ICD-10-CM | POA: Diagnosis not present

## 2022-05-06 ENCOUNTER — Inpatient Hospital Stay
Admission: EM | Admit: 2022-05-06 | Discharge: 2022-05-11 | DRG: 124 | Disposition: A | Discharge status: Skilled Nursing Facility | Payer: Medicare Other | Attending: Internal Medicine | Admitting: Internal Medicine

## 2022-05-06 ENCOUNTER — Other Ambulatory Visit: Payer: Self-pay

## 2022-05-06 ENCOUNTER — Emergency Department: Payer: Medicare Other

## 2022-05-06 DIAGNOSIS — M130 Polyarthritis, unspecified: Secondary | ICD-10-CM | POA: Diagnosis not present

## 2022-05-06 DIAGNOSIS — R441 Visual hallucinations: Secondary | ICD-10-CM | POA: Diagnosis present

## 2022-05-06 DIAGNOSIS — R079 Chest pain, unspecified: Secondary | ICD-10-CM | POA: Diagnosis not present

## 2022-05-06 DIAGNOSIS — Z20822 Contact with and (suspected) exposure to covid-19: Secondary | ICD-10-CM | POA: Diagnosis present

## 2022-05-06 DIAGNOSIS — Z602 Problems related to living alone: Secondary | ICD-10-CM | POA: Diagnosis present

## 2022-05-06 DIAGNOSIS — F05 Delirium due to known physiological condition: Secondary | ICD-10-CM | POA: Diagnosis present

## 2022-05-06 DIAGNOSIS — I5032 Chronic diastolic (congestive) heart failure: Secondary | ICD-10-CM | POA: Diagnosis not present

## 2022-05-06 DIAGNOSIS — M8580 Other specified disorders of bone density and structure, unspecified site: Secondary | ICD-10-CM | POA: Diagnosis present

## 2022-05-06 DIAGNOSIS — R443 Hallucinations, unspecified: Secondary | ICD-10-CM | POA: Diagnosis present

## 2022-05-06 DIAGNOSIS — R410 Disorientation, unspecified: Secondary | ICD-10-CM | POA: Diagnosis not present

## 2022-05-06 DIAGNOSIS — G9341 Metabolic encephalopathy: Secondary | ICD-10-CM | POA: Diagnosis not present

## 2022-05-06 DIAGNOSIS — F09 Unspecified mental disorder due to known physiological condition: Secondary | ICD-10-CM | POA: Diagnosis present

## 2022-05-06 DIAGNOSIS — R451 Restlessness and agitation: Secondary | ICD-10-CM | POA: Diagnosis present

## 2022-05-06 DIAGNOSIS — R109 Unspecified abdominal pain: Secondary | ICD-10-CM | POA: Diagnosis not present

## 2022-05-06 DIAGNOSIS — I110 Hypertensive heart disease with heart failure: Secondary | ICD-10-CM | POA: Diagnosis not present

## 2022-05-06 DIAGNOSIS — Z808 Family history of malignant neoplasm of other organs or systems: Secondary | ICD-10-CM

## 2022-05-06 DIAGNOSIS — E119 Type 2 diabetes mellitus without complications: Secondary | ICD-10-CM | POA: Diagnosis not present

## 2022-05-06 DIAGNOSIS — Z66 Do not resuscitate: Secondary | ICD-10-CM | POA: Diagnosis present

## 2022-05-06 DIAGNOSIS — Z955 Presence of coronary angioplasty implant and graft: Secondary | ICD-10-CM

## 2022-05-06 DIAGNOSIS — D649 Anemia, unspecified: Secondary | ICD-10-CM | POA: Diagnosis not present

## 2022-05-06 DIAGNOSIS — I480 Paroxysmal atrial fibrillation: Secondary | ICD-10-CM

## 2022-05-06 DIAGNOSIS — Z8261 Family history of arthritis: Secondary | ICD-10-CM

## 2022-05-06 DIAGNOSIS — I493 Ventricular premature depolarization: Secondary | ICD-10-CM | POA: Diagnosis not present

## 2022-05-06 DIAGNOSIS — T380X5A Adverse effect of glucocorticoids and synthetic analogues, initial encounter: Secondary | ICD-10-CM | POA: Diagnosis present

## 2022-05-06 DIAGNOSIS — R2681 Unsteadiness on feet: Secondary | ICD-10-CM | POA: Diagnosis not present

## 2022-05-06 DIAGNOSIS — Z820 Family history of epilepsy and other diseases of the nervous system: Secondary | ICD-10-CM | POA: Diagnosis not present

## 2022-05-06 DIAGNOSIS — Z8249 Family history of ischemic heart disease and other diseases of the circulatory system: Secondary | ICD-10-CM

## 2022-05-06 DIAGNOSIS — I25119 Atherosclerotic heart disease of native coronary artery with unspecified angina pectoris: Secondary | ICD-10-CM | POA: Diagnosis present

## 2022-05-06 DIAGNOSIS — Z7951 Long term (current) use of inhaled steroids: Secondary | ICD-10-CM

## 2022-05-06 DIAGNOSIS — Z743 Need for continuous supervision: Secondary | ICD-10-CM | POA: Diagnosis not present

## 2022-05-06 DIAGNOSIS — E1169 Type 2 diabetes mellitus with other specified complication: Secondary | ICD-10-CM | POA: Diagnosis not present

## 2022-05-06 DIAGNOSIS — J441 Chronic obstructive pulmonary disease with (acute) exacerbation: Secondary | ICD-10-CM

## 2022-05-06 DIAGNOSIS — Z9181 History of falling: Secondary | ICD-10-CM

## 2022-05-06 DIAGNOSIS — I10 Essential (primary) hypertension: Secondary | ICD-10-CM | POA: Diagnosis present

## 2022-05-06 DIAGNOSIS — Z7984 Long term (current) use of oral hypoglycemic drugs: Secondary | ICD-10-CM

## 2022-05-06 DIAGNOSIS — H3530 Unspecified macular degeneration: Secondary | ICD-10-CM | POA: Diagnosis not present

## 2022-05-06 DIAGNOSIS — Z885 Allergy status to narcotic agent status: Secondary | ICD-10-CM | POA: Diagnosis not present

## 2022-05-06 DIAGNOSIS — Z825 Family history of asthma and other chronic lower respiratory diseases: Secondary | ICD-10-CM | POA: Diagnosis not present

## 2022-05-06 DIAGNOSIS — I471 Supraventricular tachycardia: Secondary | ICD-10-CM | POA: Diagnosis present

## 2022-05-06 DIAGNOSIS — M810 Age-related osteoporosis without current pathological fracture: Secondary | ICD-10-CM | POA: Diagnosis not present

## 2022-05-06 DIAGNOSIS — R440 Auditory hallucinations: Secondary | ICD-10-CM | POA: Diagnosis present

## 2022-05-06 DIAGNOSIS — I4891 Unspecified atrial fibrillation: Secondary | ICD-10-CM | POA: Diagnosis not present

## 2022-05-06 DIAGNOSIS — Z7982 Long term (current) use of aspirin: Secondary | ICD-10-CM | POA: Diagnosis not present

## 2022-05-06 DIAGNOSIS — J449 Chronic obstructive pulmonary disease, unspecified: Secondary | ICD-10-CM | POA: Diagnosis present

## 2022-05-06 DIAGNOSIS — K5900 Constipation, unspecified: Secondary | ICD-10-CM | POA: Diagnosis not present

## 2022-05-06 DIAGNOSIS — E7800 Pure hypercholesterolemia, unspecified: Secondary | ICD-10-CM | POA: Diagnosis present

## 2022-05-06 DIAGNOSIS — R110 Nausea: Secondary | ICD-10-CM | POA: Diagnosis not present

## 2022-05-06 DIAGNOSIS — E875 Hyperkalemia: Secondary | ICD-10-CM | POA: Diagnosis not present

## 2022-05-06 DIAGNOSIS — I252 Old myocardial infarction: Secondary | ICD-10-CM

## 2022-05-06 DIAGNOSIS — R2689 Other abnormalities of gait and mobility: Secondary | ICD-10-CM | POA: Diagnosis not present

## 2022-05-06 DIAGNOSIS — I2510 Atherosclerotic heart disease of native coronary artery without angina pectoris: Secondary | ICD-10-CM | POA: Diagnosis not present

## 2022-05-06 DIAGNOSIS — E785 Hyperlipidemia, unspecified: Secondary | ICD-10-CM | POA: Diagnosis not present

## 2022-05-06 DIAGNOSIS — M069 Rheumatoid arthritis, unspecified: Secondary | ICD-10-CM | POA: Diagnosis present

## 2022-05-06 DIAGNOSIS — M353 Polymyalgia rheumatica: Secondary | ICD-10-CM | POA: Diagnosis not present

## 2022-05-06 DIAGNOSIS — Z79899 Other long term (current) drug therapy: Secondary | ICD-10-CM

## 2022-05-06 DIAGNOSIS — Z7952 Long term (current) use of systemic steroids: Secondary | ICD-10-CM

## 2022-05-06 DIAGNOSIS — M6281 Muscle weakness (generalized): Secondary | ICD-10-CM | POA: Diagnosis not present

## 2022-05-06 DIAGNOSIS — Z91148 Patient's other noncompliance with medication regimen for other reason: Secondary | ICD-10-CM

## 2022-05-06 LAB — URINALYSIS, COMPLETE (UACMP) WITH MICROSCOPIC
Bilirubin Urine: NEGATIVE
Glucose, UA: NEGATIVE mg/dL
Hgb urine dipstick: NEGATIVE
Ketones, ur: 5 mg/dL — AB
Leukocytes,Ua: NEGATIVE
Nitrite: NEGATIVE
Protein, ur: >=300 mg/dL — AB
Specific Gravity, Urine: 1.028 (ref 1.005–1.030)
pH: 5 (ref 5.0–8.0)

## 2022-05-06 LAB — CBC
HCT: 38.4 % (ref 36.0–46.0)
Hemoglobin: 12.4 g/dL (ref 12.0–15.0)
MCH: 28.8 pg (ref 26.0–34.0)
MCHC: 32.3 g/dL (ref 30.0–36.0)
MCV: 89.1 fL (ref 80.0–100.0)
Platelets: 250 10*3/uL (ref 150–400)
RBC: 4.31 MIL/uL (ref 3.87–5.11)
RDW: 13.6 % (ref 11.5–15.5)
WBC: 12 10*3/uL — ABNORMAL HIGH (ref 4.0–10.5)
nRBC: 0 % (ref 0.0–0.2)

## 2022-05-06 LAB — COMPREHENSIVE METABOLIC PANEL
ALT: 16 U/L (ref 0–44)
AST: 21 U/L (ref 15–41)
Albumin: 4.2 g/dL (ref 3.5–5.0)
Alkaline Phosphatase: 52 U/L (ref 38–126)
Anion gap: 11 (ref 5–15)
BUN: 19 mg/dL (ref 8–23)
CO2: 22 mmol/L (ref 22–32)
Calcium: 8.9 mg/dL (ref 8.9–10.3)
Chloride: 108 mmol/L (ref 98–111)
Creatinine, Ser: 0.69 mg/dL (ref 0.44–1.00)
GFR, Estimated: >60 mL/min (ref >60)
Glucose, Bld: 163 mg/dL — ABNORMAL HIGH (ref 70–99)
Potassium: 3.9 mmol/L (ref 3.5–5.1)
Sodium: 141 mmol/L (ref 135–145)
Total Bilirubin: 1.1 mg/dL (ref 0.3–1.2)
Total Protein: 7 g/dL (ref 6.5–8.1)

## 2022-05-06 LAB — RESP PANEL BY RT-PCR (FLU A&B, COVID) ARPGX2
Influenza A by PCR: NEGATIVE
Influenza B by PCR: NEGATIVE
SARS Coronavirus 2 by RT PCR: NEGATIVE

## 2022-05-06 LAB — GLUCOSE, CAPILLARY
Glucose-Capillary: 178 mg/dL — ABNORMAL HIGH (ref 70–99)
Glucose-Capillary: 140 mg/dL — ABNORMAL HIGH (ref 70–99)

## 2022-05-06 MED ORDER — METOPROLOL TARTRATE 50 MG PO TABS
50.0000 mg | ORAL_TABLET | Freq: Two times a day (BID) | ORAL | Status: DC
  Filled 2022-05-06 (×2): qty 1

## 2022-05-06 MED ORDER — HALOPERIDOL LACTATE 5 MG/ML IJ SOLN
1.0000 mg | Freq: Four times a day (QID) | INTRAMUSCULAR | Status: AC | PRN
  Administered 2022-05-07 (×2): 1 mg via INTRAVENOUS
  Filled 2022-05-06 (×2): qty 1

## 2022-05-06 MED ORDER — SODIUM CHLORIDE 0.9% FLUSH
3.0000 mL | Freq: Two times a day (BID) | INTRAVENOUS | Status: DC
  Administered 2022-05-06 – 2022-05-11 (×10): 3 mL via INTRAVENOUS

## 2022-05-06 MED ORDER — NITROGLYCERIN 0.4 MG SL SUBL: 0.4000 mg | SUBLINGUAL_TABLET | SUBLINGUAL | Status: DC | PRN

## 2022-05-06 MED ORDER — SODIUM CHLORIDE 0.9 % IV SOLN: 250.0000 mL | INTRAVENOUS | Status: DC | PRN

## 2022-05-06 MED ORDER — ASPIRIN 81 MG PO CHEW
81.0000 mg | CHEWABLE_TABLET | Freq: Every day | ORAL | Status: DC
  Administered 2022-05-07 – 2022-05-11 (×5): 81 mg via ORAL
  Filled 2022-05-06 (×6): qty 1

## 2022-05-06 MED ORDER — SODIUM CHLORIDE 0.9% FLUSH
3.0000 mL | Freq: Two times a day (BID) | INTRAVENOUS | Status: DC
  Administered 2022-05-06 – 2022-05-10 (×7): 3 mL via INTRAVENOUS

## 2022-05-06 MED ORDER — VITAMIN D (ERGOCALCIFEROL) 1.25 MG (50000 UNIT) PO CAPS
50000.0000 [IU] | ORAL_CAPSULE | ORAL | Status: DC
  Filled 2022-05-06: qty 1

## 2022-05-06 MED ORDER — MOMETASONE FURO-FORMOTEROL FUM 200-5 MCG/ACT IN AERO
2.0000 | INHALATION_SPRAY | Freq: Two times a day (BID) | RESPIRATORY_TRACT | Status: DC
  Administered 2022-05-06 – 2022-05-11 (×10): 2 via RESPIRATORY_TRACT
  Filled 2022-05-06 (×4): qty 8.8

## 2022-05-06 MED ORDER — ONDANSETRON HCL 4 MG PO TABS: 4.0000 mg | ORAL_TABLET | Freq: Four times a day (QID) | ORAL | Status: DC | PRN

## 2022-05-06 MED ORDER — ATORVASTATIN CALCIUM 80 MG PO TABS
80.0000 mg | ORAL_TABLET | Freq: Every day | ORAL | Status: DC
  Administered 2022-05-07 – 2022-05-10 (×4): 80 mg via ORAL
  Filled 2022-05-06 (×4): qty 1
  Filled 2022-05-06: qty 4

## 2022-05-06 MED ORDER — OCUVITE-LUTEIN PO CAPS
1.0000 | ORAL_CAPSULE | Freq: Two times a day (BID) | ORAL | Status: DC
Start: 2022-05-06 — End: 2022-05-11
  Administered 2022-05-07 – 2022-05-11 (×7): 1 via ORAL
  Filled 2022-05-06 (×12): qty 1

## 2022-05-06 MED ORDER — INSULIN ASPART 100 UNIT/ML IJ SOLN
0.0000 [IU] | Freq: Three times a day (TID) | INTRAMUSCULAR | Status: DC
  Administered 2022-05-06: 3 [IU] via SUBCUTANEOUS
  Administered 2022-05-07: 2 [IU] via SUBCUTANEOUS
  Administered 2022-05-07: 3 [IU] via SUBCUTANEOUS
  Administered 2022-05-07: 5 [IU] via SUBCUTANEOUS
  Filled 2022-05-06 (×5): qty 1

## 2022-05-06 MED ORDER — SODIUM CHLORIDE 0.9% FLUSH: 3.0000 mL | INTRAVENOUS | Status: DC | PRN

## 2022-05-06 MED ORDER — ONDANSETRON HCL 4 MG/2ML IJ SOLN: 4.0000 mg | Freq: Four times a day (QID) | INTRAMUSCULAR | Status: DC | PRN

## 2022-05-06 MED ORDER — QUETIAPINE FUMARATE 25 MG PO TABS
12.5000 mg | ORAL_TABLET | Freq: Two times a day (BID) | ORAL | Status: DC | PRN
  Administered 2022-05-06 – 2022-05-07 (×2): 12.5 mg via ORAL
  Filled 2022-05-06 (×2): qty 1

## 2022-05-06 MED ORDER — ENOXAPARIN SODIUM 40 MG/0.4ML IJ SOSY
40.0000 mg | PREFILLED_SYRINGE | INTRAMUSCULAR | Status: DC
  Administered 2022-05-06 – 2022-05-11 (×6): 40 mg via SUBCUTANEOUS
  Filled 2022-05-06 (×6): qty 0.4

## 2022-05-06 MED ORDER — FOLIC ACID 1 MG PO TABS
1.0000 mg | ORAL_TABLET | Freq: Every day | ORAL | Status: DC
  Administered 2022-05-07 – 2022-05-11 (×5): 1 mg via ORAL
  Filled 2022-05-06 (×6): qty 1

## 2022-05-06 MED ORDER — IPRATROPIUM-ALBUTEROL 0.5-2.5 (3) MG/3ML IN SOLN: 3.0000 mL | Freq: Two times a day (BID) | RESPIRATORY_TRACT | Status: DC | PRN

## 2022-05-06 MED ORDER — ACETAMINOPHEN 500 MG PO TABS
500.0000 mg | ORAL_TABLET | Freq: Four times a day (QID) | ORAL | Status: DC | PRN
  Administered 2022-05-07 – 2022-05-09 (×3): 500 mg via ORAL
  Administered 2022-05-09 – 2022-05-10 (×5): 1000 mg via ORAL
  Filled 2022-05-06: qty 1
  Filled 2022-05-06 (×2): qty 2
  Filled 2022-05-06: qty 1
  Filled 2022-05-06: qty 2
  Filled 2022-05-06: qty 1
  Filled 2022-05-06 (×2): qty 2

## 2022-05-06 NOTE — H&P (Signed)
History and Physical    Patient: Tanya Cole PJA:[id number] DOB: [birth year] DOA: 05/06/2022 DOS: the patient was seen and examined on 05/06/2022 PCP: Einar Pheasant, MD  Patient coming from: Home  Chief Complaint:  Chief Complaint  Patient presents with   Altered Mental Status   HPI: Tanya Cole is a 88 y.o. female with medical history significant for diabetes mellitus, chronic diastolic dysfunction CHF, coronary artery disease, hypertension, polymyalgia rheumatica, rheumatoid arthritis who was brought into the ER by EMS for evaluation of visual and auditory hallucinations. Most of the history was obtained from patient's daughter who states that over the last 1 week patient has had hallucinations which have progressively worsened.  She was admitted about a month ago for same but at that time had pneumonia.  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  Patient's prednisone has since been discontinued by rheumatology.   Patient states that she lives alone and notes that she has become increasingly forgetful. She denies having any cough, no fever, no chills, no abdominal pain, no urinary symptoms, no headache, no blurred vision no focal deficits.  Review of Systems: As mentioned in the history of present illness. All other systems reviewed and are negative. Past Medical History:  Diagnosis Date   (HFpEF) heart failure with preserved ejection fraction ([place])    a. TTE 12/19: EF 55-60%, probable HK of the mid apical anterior septal myocardium, Gr1DD, mild AI, mildly dilated LA; b.07/2020 Echo: EF 60-65%, no rwma, Gr2 DD. Nl RV fxn. Mildly dil LA. Mild MR.   Asthma    CAD (coronary artery disease)    a. 09/2018 NSTEMI/PCI: LM min irregs, mLAD 95 (PCI/DES), mLAD-2 60%, LCx mild diff dzs, RCA min irregs; b. 03/2020 MV: EF>65%, no ischemia/scar; c. 07/2020 Cath: LM min irregs, LAD 30p, 56m 90d, D1/2 min irregs, LCX diff dzs throughout, OM1/2/3 mild dzs, RCA 30p, RPDA/RPAV  min irrges. EF 55-65%.   [place]F [place]estive heart failure) ([place]    Diabetes mellitus ([place]    Hypercholesterolemia    Hypertension    Myocardial infarction ([place]    Osteopenia    Palpitations    a. 04/2020 Zio: Avg HR 75. 429 SVT episodes, longest 19 secs @ 133. Occas PACs (3.2%). Rare PVCs (<1%).   Polymyalgia rheumatica syndrome (HCC)    Reactive airway disease    Severe sepsis ([place]    Past Surgical History:  Procedure Laterality Date   ABDOMINAL HYSTERECTOMY  1981   prolapse and bleeding, ovaries not removed   BREAST EXCISIONAL BIOPSY Right    CHOLECYSTECTOMY N/A 09/02/2019   Procedure: LAPAROSCOPIC CHOLECYSTECTOMY WITH INTRAOPERATIVE CHOLANGIOGRAM;  Surgeon: POlean Ree MD;  Location: [facility];  Service: General;  Laterality: N/A;   CORONARY STENT INTERVENTION N/A 10/08/2018   Procedure: CORONARY STENT INTERVENTION;  Surgeon: AWellington Hampshire MD;  [facility];  Service: Cardiovascular;  Laterality: N/A;   ENDOSCOPIC RETROGRADE CHOLANGIOPANCREATOGRAPHY (ERCP) WITH PROPOFOL N/A 08/08/2019   Procedure: ENDOSCOPIC RETROGRADE CHOLANGIOPANCREATOGRAPHY (ERCP) WITH PROPOFOL;  Surgeon: WLucilla Lame MD;  Location: [facility];  Service: Endoscopy;  Laterality: N/A;   LEFT HEART CATH AND CORONARY ANGIOGRAPHY N/A 10/08/2018   Procedure: LEFT HEART CATH AND CORONARY ANGIOGRAPHY;  Surgeon: AWellington Hampshire MD;  [facility];  Service: Cardiovascular;  Laterality: N/A;   LEFT HEART CATH AND CORONARY ANGIOGRAPHY N/A 08/10/2020   Procedure: LEFT HEART CATH AND CORONARY ANGIOGRAPHY possible percutaneous intervention;  Surgeon: AWellington Hampshire MD;  [facility];  Service: Cardiovascular;  Laterality: N/A;   UMBILICAL HERNIA REPAIR  7/94   Social History:  reports that she has never smoked. She has never used smokeless tobacco. She reports that she does not drink alcohol and does not use drugs.  Allergies  Allergen Reactions   Tramadol  Itching and Nausea And Vomiting    Family History  Problem Relation Age of Onset   Arthritis Mother    Heart disease Mother    Heart attack Father    Throat cancer Sister    Parkinson's disease Sister    COPD Brother    COPD Brother     Prior to Admission medications   Medication Sig Start Date End Date Taking? Authorizing Provider  acetaminophen (TYLENOL) 500 MG tablet Take 500-1,000 mg by mouth every 6 (six) hours as needed for mild pain or moderate pain.   Yes [provider]  aspirin 81 MG tablet Take 81 mg by mouth daily.   Yes [provider]  atorvastatin (LIPITOR) 80 MG tablet TAKE 1 TABLET BY MOUTH ONCE DAILY AT  6PM 05/04/21  Yes Loel Dubonnet, NP  fluticasone-salmeterol (ADVAIR) 250-50 MCG/ACT AEPB Inhale 1 puff into the lungs in the morning and at bedtime. 06/09/21  Yes Einar Pheasant, MD  folic acid (FOLVITE) 1 MG tablet Take 1 mg by mouth daily. 12/20/21  Yes [provider]  ipratropium-albuterol (DUONEB) 0.5-2.5 (3) MG/3ML SOLN Take 3 mLs by nebulization 2 (two) times daily. Patient taking differently: Take 3 mLs by nebulization 2 (two) times daily as needed (shortness of breath or wheezing). 05/17/21  Yes Hosie Poisson, MD  metFORMIN (GLUCOPHAGE) 500 MG tablet TAKE 1 TABLET BY MOUTH TWICE DAILY WITH A MEAL 04/07/22  Yes Einar Pheasant, MD  metoprolol tartrate (LOPRESSOR) 50 MG tablet Take 1 tablet (50 mg total) by mouth 2 (two) times daily. 04/06/22  Yes Emeterio Reeve, DO  Multiple Vitamins-Minerals (PRESERVISION AREDS 2) CAPS Take 1 capsule by mouth 2 (two) times daily.   Yes [provider]  predniSONE (DELTASONE) 2.5 MG tablet Take 2.5 mg by mouth daily with breakfast.   Yes [provider]  PROAIR HFA 108 (90 Base) MCG/ACT inhaler Inhale 2 puffs into the lungs every 4 (four) hours as needed for wheezing or shortness of breath. 08/20/20  Yes Lavina Hamman, MD  QUEtiapine (SEROQUEL) 25 MG tablet Take 0.5 tablets (12.5 mg  total) by mouth 2 (two) times daily as needed (hallucinations with anxiety, agitation). 04/06/22  Yes Emeterio Reeve, DO  Vitamin D, Ergocalciferol, (DRISDOL) 50000 units CAPS capsule Take 50,000 Units by mouth every Friday.   Yes [provider]  nitroGLYCERIN (NITROSTAT) 0.4 MG SL tablet Place 1 tablet (0.4 mg total) under the tongue every 5 (five) minutes as needed for chest pain. 04/06/22   Emeterio Reeve, DO    Physical Exam: Vitals:   05/06/22 1230 05/06/22 1300 05/06/22 1330 05/06/22 1400  BP: 131/61 124/60 (!) 144/59 (!) 154/68  Pulse: (!) 53 65 66 77  Resp: (!) 24 (!) 22 (!) 24 20  Temp:      TempSrc:      SpO2: 98% 100% 96% 100%  Weight:      Height:       Physical Exam Vitals and nursing note reviewed.  Constitutional:      Comments: Tearful.  Oriented to person and place  HENT:     Head: Normocephalic and atraumatic.     Nose: Nose normal.  Mouth/Throat:     Mouth: Mucous membranes are moist.  Eyes:     Pupils: Pupils are equal, round, and reactive to light.  Cardiovascular:     Rate and Rhythm: Normal rate and regular rhythm.  Pulmonary:     Effort: Pulmonary effort is normal.     Breath sounds: Normal breath sounds.  Abdominal:     General: Abdomen is flat. Bowel sounds are normal.     Palpations: Abdomen is soft.  Musculoskeletal:        General: Normal range of motion.     Cervical back: Normal range of motion and neck supple.  Skin:    General: Skin is warm and dry.  Neurological:     General: No focal deficit present.     Mental Status: She is alert.  Psychiatric:     Comments: Depressed mood, flat affect Very tearful     Data Reviewed: Relevant notes from primary care and specialist visits, past discharge summaries as available in EHR, including Care Everywhere. Prior diagnostic testing as pertinent to current admission diagnoses Updated medications and problem lists for reconciliation ED course, including vitals, labs, imaging,  treatment and response to treatment Triage notes, nursing and pharmacy notes and ED provider's notes Notable results as noted in HPI Labs reviewed.  Sodium 141 potassium 3.9 chloride 100, bicarb 22, glucose 163, BUN 19, creatinine 0.69, calcium 8.9, total protein 7.0, albumin 4.2, AST 21, ALT 16, alk phos 52, total bilirubin 1.1, white count 12.0, hemoglobin 12.4, hematocrit 38.4, platelet count 250 Chest x-ray reviewed by me shows no active cardiopulmonary disease. Stable streaky lingular scarring. Twelve-lead EKG reviewed by me shows sinus rhythm with PACs.  ST depressions in the anterior lateral leads. There are no new results to review at this time.  Assessment and Plan: * Hallucinations Patient presents to the ER via EMS for evaluation of visual and auditory hallucinations She was recently hospitalized for same and at that time her symptoms were attributed to steroid-induced psychosis because she had been on prednisone for her arthritis. Patient has been off prednisone for over a week and now has symptoms. Her labs are within normal limits and vital signs are stable Concern for possible dementia Continue as needed Seroquel We will consult psychiatry  Polymyalgia rheumatica syndrome (HCC) Stable and not acutely exacerbated Continue methotrexate 12.5 mg weekly and folic acid 1 mg daily  Hypertension Blood pressure is stable Continue metoprolol  Diabetes mellitus (HCC) Hold metformin Maintain consistent carbohydrate diet Glycemic control sliding scale   CAD (coronary artery disease) Stable Continue aspirin, atorvastatin and metoprolol      Advance Care Planning:   Code Status: DNR   Consults: Psychiatry  Family Communication: Greater than 50% of time was spent discussing patient's condition and plan of care with her daughter over the phone.  All questions and concerns have been addressed.  She verbalizes understanding and agrees with the plan.  CODE STATUS was discussed  and patient is a DO NOT RESUSCITATE  Severity of Illness: The appropriate patient status for this patient is OBSERVATION. Observation status is judged to be reasonable and necessary in order to provide the required intensity of service to ensure the patient's safety. The patient's presenting symptoms, physical exam findings, and initial radiographic and laboratory data in the context of their medical condition is felt to place them at decreased risk for further clinical deterioration. Furthermore, it is anticipated that the patient will be medically stable for discharge from the hospital within 2 midnights  of admission.   Author: Collier Bullock, MD 05/06/2022 2:43 PM  For on call review [email or web address].

## 2022-05-06 NOTE — ED Triage Notes (Signed)
Pt here via [facility] with AMS and hallucinations. Pt talking to people that are not there. Last time this occured pt had pnu. Pt has not been taking her meds in the past 2 days,  hallucinations x1 week. Pt may have poissible early dementia. Pt also has short term memory loss.   130/73 60 96% RA 99.5 173-cbg

## 2022-05-06 NOTE — Plan of Care (Signed)
  Problem: Activity: Goal: Risk for activity intolerance will decrease Outcome: Progressing   Problem: Nutrition: Goal: Adequate nutrition will be maintained Outcome: Progressing   Problem: Coping: Goal: Level of anxiety will decrease Outcome: Progressing   Problem: Elimination: Goal: Will not experience complications related to bowel motility Outcome: Progressing   Problem: Pain Managment: Goal: General experience of comfort will improve Outcome: Progressing   

## 2022-05-06 NOTE — Assessment & Plan Note (Signed)
Blood pressure is stable Continue metoprolol 

## 2022-05-06 NOTE — ED Notes (Signed)
Attempted urine collection, patient unable to void at this time.

## 2022-05-06 NOTE — Assessment & Plan Note (Signed)
Hold metformin Maintain consistent carbohydrate diet Glycemic control sliding scale

## 2022-05-06 NOTE — ED Provider Notes (Signed)
[facility] Provider Note    Event Date/Time   First MD Initiated Contact with Patient 05/06/22 [phone]     (approximate)  History   Chief Complaint: Altered Mental Status  HPI  Tanya Cole is a 88 y.o. female with a past medical history of CHF, CAD, diabetes, hypertension, hyperlipidemia, presents to the emergency department for possible altered mental state.  According to EMS report they were called out to the patient's residence with report that the patient was possibly hallucinating, talking to people who were not present.  Here the patient is awake alert she is oriented x4.  Patient denies any medical issues/complaints.  Denies any cough congestion fever or dysuria.  No chest pain or abdominal pain.  Physical Exam   Triage Vital Signs: ED Triage Vitals  Enc Vitals Group     BP 05/06/22 0917 (!) 150/67     Pulse Rate 05/06/22 0917 67     Resp 05/06/22 0917 16     Temp 05/06/22 0917 99.5 F (37.5 C)     Temp Source 05/06/22 0917 Oral     SpO2 05/06/22 0917 95 %     Weight 05/06/22 0918 126 lb 8.7 oz (57.4 kg)     Height 05/06/22 0918 5\' 1"  (1.549 m)     Head Circumference --      Peak Flow --      Pain Score 05/06/22 0917 0     Pain Loc --      Pain Edu? --      Excl. in GC? --     Most recent vital signs: Vitals:   05/06/22 0917  BP: (!) 150/67  Pulse: 67  Resp: 16  Temp: 99.5 F (37.5 C)  SpO2: 95%    General: Awake, no distress.  CV:  Good peripheral perfusion.  Regular rate and rhythm  Resp:  Normal effort.  Equal breath sounds bilaterally.  Abd:  No distention.  Soft, nontender.  No rebound or guarding.   ED Results / Procedures / Treatments   EKG  EKG viewed and interpreted by myself shows a normal sinus rhythm at 62 bpm the narrow QRS, normal axis, normal intervals, no concerning ST changes.  RADIOLOGY  I have reviewed and interpreted the patient's chest x-ray.  I do not see any abnormalities on my  evaluation. Radiology is read the x-ray is negative.   MEDICATIONS ORDERED IN ED: Medications - No data to display   IMPRESSION / MDM / ASSESSMENT AND PLAN / ED COURSE  I reviewed the triage vital signs and the nursing notes.  Patient's presentation is most consistent with acute presentation with potential threat to life or bodily function.  Patient presents to the emergency department for possible altered mental state.  Currently the patient appears well, no distress.  She is alert she is oriented x3.  She has no complaints.  Patient does have a borderline low-grade temperature 99.5.  We will continue to monitor this in the emergency department.  We will check labs, chest x-ray and a urinalysis.  Patient agreeable to plan of care.  Differential would include metabolic or electrolyte abnormality, infectious etiology, possible dementia, medication reaction.  Patient's work-up is overall reassuring.  Urinalysis appears normal.  Chemistry is normal.  CBC shows no concerning findings.  Chest x-ray appears clear.  Patient had a recent MRI of the brain.  I reviewed the patient's recent discharge summary from approximately 1 month ago at which time the patient was  admitted for altered mental status and pneumonia was treated and improved.  Patient is on daily prednisone for her polymyalgia rheumatica, prednisone could possibly be inducing hallucinations.  Given the patient's acute delirium we will attempt to admit to the hospital service for ongoing management medication adjustment and ensuring that the patient's hallucinations resolved.  Daughter agreeable to plan of care.  Daughter states patient continues to have visual hallucinations in the room, in the room was complaining of seeing wasps on the wall and hearing songs being sung.  Patient's work-up is overall reassuring.  Chest x-ray is clear.  COVID/flu negative.  CMP is normal, urinalysis does not appear to show any significant infection.  CBC does  show slight white blood cell count at 12,000.  I reviewed the patient's recent admission when she was admitted for the same.  I spoke to the hospitalist who has agreed to admit the patient for acute delirium.  FINAL CLINICAL IMPRESSION(S) / ED DIAGNOSES   Altered mental status Acute delirium  Note:  This document was prepared using Dragon voice recognition software and may include unintentional dictation errors.   [name], MD 05/06/22 1521

## 2022-05-06 NOTE — Assessment & Plan Note (Addendum)
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 Patient has been off prednisone for over a week and now has symptoms. Her labs are within normal limits and vital signs are stable Concern for possible dementia Continue as needed Seroquel We will consult psychiatry

## 2022-05-06 NOTE — Assessment & Plan Note (Signed)
Stable Continue aspirin, atorvastatin and metoprolol

## 2022-05-06 NOTE — Progress Notes (Signed)
Patient refused to take scheduled oral medicine. Informed patient the importance of her medicine, patient state that she had  taken her medicine already and she don't like to take another pills.

## 2022-05-06 NOTE — Assessment & Plan Note (Signed)
Stable and not acutely exacerbated Continue methotrexate 12.5 mg weekly and folic acid 1 mg daily

## 2022-05-07 ENCOUNTER — Encounter: Payer: Self-pay | Admitting: Internal Medicine

## 2022-05-07 DIAGNOSIS — M353 Polymyalgia rheumatica: Secondary | ICD-10-CM | POA: Diagnosis present

## 2022-05-07 DIAGNOSIS — I5032 Chronic diastolic (congestive) heart failure: Secondary | ICD-10-CM

## 2022-05-07 DIAGNOSIS — E119 Type 2 diabetes mellitus without complications: Secondary | ICD-10-CM | POA: Diagnosis present

## 2022-05-07 DIAGNOSIS — Z66 Do not resuscitate: Secondary | ICD-10-CM | POA: Diagnosis present

## 2022-05-07 DIAGNOSIS — R441 Visual hallucinations: Secondary | ICD-10-CM | POA: Diagnosis present

## 2022-05-07 DIAGNOSIS — Z20822 Contact with and (suspected) exposure to covid-19: Secondary | ICD-10-CM | POA: Diagnosis present

## 2022-05-07 DIAGNOSIS — T380X5A Adverse effect of glucocorticoids and synthetic analogues, initial encounter: Secondary | ICD-10-CM | POA: Diagnosis present

## 2022-05-07 DIAGNOSIS — E785 Hyperlipidemia, unspecified: Secondary | ICD-10-CM | POA: Diagnosis not present

## 2022-05-07 DIAGNOSIS — I25119 Atherosclerotic heart disease of native coronary artery with unspecified angina pectoris: Secondary | ICD-10-CM | POA: Diagnosis present

## 2022-05-07 DIAGNOSIS — Z8261 Family history of arthritis: Secondary | ICD-10-CM | POA: Diagnosis not present

## 2022-05-07 DIAGNOSIS — J449 Chronic obstructive pulmonary disease, unspecified: Secondary | ICD-10-CM | POA: Diagnosis present

## 2022-05-07 DIAGNOSIS — F05 Delirium due to known physiological condition: Secondary | ICD-10-CM | POA: Diagnosis present

## 2022-05-07 DIAGNOSIS — I471 Supraventricular tachycardia: Secondary | ICD-10-CM | POA: Diagnosis present

## 2022-05-07 DIAGNOSIS — Z808 Family history of malignant neoplasm of other organs or systems: Secondary | ICD-10-CM | POA: Diagnosis not present

## 2022-05-07 DIAGNOSIS — I10 Essential (primary) hypertension: Secondary | ICD-10-CM | POA: Diagnosis not present

## 2022-05-07 DIAGNOSIS — Z885 Allergy status to narcotic agent status: Secondary | ICD-10-CM | POA: Diagnosis not present

## 2022-05-07 DIAGNOSIS — G9341 Metabolic encephalopathy: Secondary | ICD-10-CM | POA: Diagnosis present

## 2022-05-07 DIAGNOSIS — R443 Hallucinations, unspecified: Secondary | ICD-10-CM | POA: Diagnosis present

## 2022-05-07 DIAGNOSIS — I480 Paroxysmal atrial fibrillation: Secondary | ICD-10-CM

## 2022-05-07 DIAGNOSIS — E1169 Type 2 diabetes mellitus with other specified complication: Secondary | ICD-10-CM

## 2022-05-07 DIAGNOSIS — Z602 Problems related to living alone: Secondary | ICD-10-CM | POA: Diagnosis present

## 2022-05-07 DIAGNOSIS — Z820 Family history of epilepsy and other diseases of the nervous system: Secondary | ICD-10-CM | POA: Diagnosis not present

## 2022-05-07 DIAGNOSIS — I4891 Unspecified atrial fibrillation: Secondary | ICD-10-CM | POA: Diagnosis not present

## 2022-05-07 DIAGNOSIS — Z825 Family history of asthma and other chronic lower respiratory diseases: Secondary | ICD-10-CM | POA: Diagnosis not present

## 2022-05-07 DIAGNOSIS — F09 Unspecified mental disorder due to known physiological condition: Secondary | ICD-10-CM | POA: Diagnosis present

## 2022-05-07 DIAGNOSIS — I110 Hypertensive heart disease with heart failure: Secondary | ICD-10-CM | POA: Diagnosis present

## 2022-05-07 DIAGNOSIS — R440 Auditory hallucinations: Secondary | ICD-10-CM | POA: Diagnosis present

## 2022-05-07 DIAGNOSIS — Z7982 Long term (current) use of aspirin: Secondary | ICD-10-CM | POA: Diagnosis not present

## 2022-05-07 DIAGNOSIS — Z8249 Family history of ischemic heart disease and other diseases of the circulatory system: Secondary | ICD-10-CM | POA: Diagnosis not present

## 2022-05-07 LAB — CBC
HCT: 41.9 % (ref 36.0–46.0)
Hemoglobin: 13.7 g/dL (ref 12.0–15.0)
MCH: 28.9 pg (ref 26.0–34.0)
MCHC: 32.7 g/dL (ref 30.0–36.0)
MCV: 88.4 fL (ref 80.0–100.0)
Platelets: 247 10*3/uL (ref 150–400)
RBC: 4.74 MIL/uL (ref 3.87–5.11)
RDW: 13.5 % (ref 11.5–15.5)
WBC: 11.8 10*3/uL — ABNORMAL HIGH (ref 4.0–10.5)
nRBC: 0 % (ref 0.0–0.2)

## 2022-05-07 LAB — BASIC METABOLIC PANEL
Anion gap: 12 (ref 5–15)
BUN: 14 mg/dL (ref 8–23)
CO2: 21 mmol/L — ABNORMAL LOW (ref 22–32)
Calcium: 8.9 mg/dL (ref 8.9–10.3)
Chloride: 108 mmol/L (ref 98–111)
Creatinine, Ser: 0.65 mg/dL (ref 0.44–1.00)
GFR, Estimated: >60 mL/min (ref >60)
Glucose, Bld: 185 mg/dL — ABNORMAL HIGH (ref 70–99)
Potassium: 3.2 mmol/L — ABNORMAL LOW (ref 3.5–5.1)
Sodium: 141 mmol/L (ref 135–145)

## 2022-05-07 LAB — GLUCOSE, CAPILLARY
Glucose-Capillary: 188 mg/dL — ABNORMAL HIGH (ref 70–99)
Glucose-Capillary: 221 mg/dL — ABNORMAL HIGH (ref 70–99)
Glucose-Capillary: 224 mg/dL — ABNORMAL HIGH (ref 70–99)
Glucose-Capillary: 149 mg/dL — ABNORMAL HIGH (ref 70–99)
Glucose-Capillary: 279 mg/dL — ABNORMAL HIGH (ref 70–99)

## 2022-05-07 LAB — TSH: TSH: 2.064 u[IU]/mL (ref 0.350–4.500)

## 2022-05-07 LAB — RPR: RPR Ser Ql: NONREACTIVE

## 2022-05-07 LAB — VITAMIN B12: Vitamin B-12: 620 pg/mL (ref 180–914)

## 2022-05-07 LAB — FOLATE: Folate: >40 ng/mL (ref >5.9)

## 2022-05-07 MED ORDER — POTASSIUM CHLORIDE CRYS ER 20 MEQ PO TBCR
40.0000 meq | EXTENDED_RELEASE_TABLET | ORAL | Status: AC
  Administered 2022-05-07: 40 meq via ORAL
  Filled 2022-05-07: qty 2

## 2022-05-07 MED ORDER — HYDRALAZINE HCL 20 MG/ML IJ SOLN: 10.0000 mg | INTRAMUSCULAR | Status: DC | PRN

## 2022-05-07 MED ORDER — DILTIAZEM HCL 25 MG/5ML IV SOLN
5.0000 mg | Freq: Once | INTRAVENOUS | Status: AC
  Administered 2022-05-07: 5 mg via INTRAVENOUS

## 2022-05-07 MED ORDER — METOPROLOL TARTRATE 25 MG PO TABS
25.0000 mg | ORAL_TABLET | Freq: Four times a day (QID) | ORAL | Status: DC
  Administered 2022-05-07 – 2022-05-09 (×10): 25 mg via ORAL
  Filled 2022-05-07 (×10): qty 1

## 2022-05-07 MED ORDER — AMIODARONE HCL 200 MG PO TABS
400.0000 mg | ORAL_TABLET | Freq: Two times a day (BID) | ORAL | Status: DC
  Administered 2022-05-07 – 2022-05-11 (×9): 400 mg via ORAL
  Filled 2022-05-07 (×9): qty 2

## 2022-05-07 MED ORDER — DILTIAZEM HCL-DEXTROSE 125-5 MG/125ML-% IV SOLN (PREMIX)
5.0000 mg/h | INTRAVENOUS | Status: DC
  Administered 2022-05-07: 10 mg/h via INTRAVENOUS
  Administered 2022-05-07 (×2): 5 mg/h via INTRAVENOUS
  Filled 2022-05-07 (×2): qty 125

## 2022-05-07 MED ORDER — INSULIN ASPART 100 UNIT/ML IJ SOLN
0.0000 [IU] | Freq: Three times a day (TID) | INTRAMUSCULAR | Status: DC
  Administered 2022-05-08 – 2022-05-09 (×3): 5 [IU] via SUBCUTANEOUS
  Administered 2022-05-09 (×2): 2 [IU] via SUBCUTANEOUS
  Administered 2022-05-10: 15 [IU] via SUBCUTANEOUS
  Administered 2022-05-10 – 2022-05-11 (×3): 3 [IU] via SUBCUTANEOUS
  Filled 2022-05-07 (×9): qty 1

## 2022-05-07 MED ORDER — INSULIN ASPART 100 UNIT/ML IJ SOLN: 0.0000 [IU] | Freq: Every day | INTRAMUSCULAR | Status: DC

## 2022-05-07 MED ORDER — DILTIAZEM LOAD VIA INFUSION
10.0000 mg | Freq: Once | INTRAVENOUS | Status: AC
  Administered 2022-05-07: 10 mg via INTRAVENOUS
  Filled 2022-05-07: qty 10

## 2022-05-07 MED ORDER — QUETIAPINE FUMARATE 25 MG PO TABS: 25.0000 mg | ORAL_TABLET | Freq: Two times a day (BID) | ORAL | Status: DC

## 2022-05-07 MED ORDER — LORAZEPAM 0.5 MG PO TABS
0.5000 mg | ORAL_TABLET | Freq: Two times a day (BID) | ORAL | Status: DC | PRN
  Administered 2022-05-07 – 2022-05-11 (×6): 0.5 mg via ORAL
  Filled 2022-05-07 (×6): qty 1

## 2022-05-07 NOTE — Assessment & Plan Note (Signed)
Diltiazem bolus and load ordered on 05/07/2022 after patient went into rapid A-fib with rate in the 160s to 190s Home metoprolol held for now

## 2022-05-07 NOTE — Progress Notes (Signed)
Patient HR 160's - 200, no SOB noted, MD on duty notified.

## 2022-05-07 NOTE — Consult Note (Addendum)
[facility] Consult   Reason for Consult:  hallucinations Referring Physician:  Dr Johnna Acosta Patient Identification: Tanya Cole MRN:  [id number] Principal Diagnosis: Hallucinations Diagnosis:  Principal Problem:   Hallucinations Active Problems:   Hypertension   Diabetes mellitus ([place])   Polymyalgia rheumatica syndrome (HCC)   CAD (coronary artery disease)   Paroxysmal atrial fibrillation with RVR ([place])   Total Time spent with patient: 45 minutes  Subjective:   Tanya Cole is a 88 y.o. female patient admitted with medical issues and hallucinations.  HPI:  88 yo female presented with AMS and hallucinations.  She is currently seeing spiders in her room, diagnosed with metabolic encephalopathy on admission.  Medications adjusted to assist with the hallucinations while her medical issues clear.  Denies depression, moderate anxiety.  NO suicidal/homicidal ideations or substance use.  Symptoms should resolved after resolution of any underlying medical concerns and sleep issues.  Meanwhile, Seroquel PRN dose changed to BID would be recommended but contraindicated with her heart issues.  Consulted with the pharmacist and she agreed that Ativan 0.5 mg BID should assist.  Evidently, her hallucinations resolve when her medical issue resolves.  Past Psychiatric History: none  Risk to Self:  none Risk to Others:  none Prior Inpatient Therapy:  none Prior Outpatient Therapy: none   Past Medical History:  Past Medical History:  Diagnosis Date   (HFpEF) heart failure with preserved ejection fraction ([place])    a. TTE 12/19: EF 55-60%, probable HK of the mid apical anterior septal myocardium, Gr1DD, mild AI, mildly dilated LA; b.07/2020 Echo: EF 60-65%, no rwma, Gr2 DD. Nl RV fxn. Mildly dil LA. Mild MR; c. 10/2021 Echo: EF 60-65%, no rwma, mild LVH, nl RV fxn, mod elev PASP, mildly dil LA, mild MR, Ao sclerosis.   Asthma    CAD (coronary artery disease)    a. 09/2018  NSTEMI/PCI: LM min irregs, mLAD 95 (PCI/DES), mLAD-2 60%, LCx mild diff dzs, RCA min irregs; b. 03/2020 MV: EF>65%, no ischemia/scar; c. 07/2020 Cath: LM min irregs, LAD 30p, 48m, 90d, D1/2 min irregs, LCX diff dzs throughout, OM1/2/3 mild dzs, RCA 30p, RPDA/RPAV min irrges. EF 55-65%.   CHF (congestive heart failure) ([place])    Diabetes mellitus ([place])    Hypercholesterolemia    Hypertension    Myocardial infarction ([place])    Osteopenia    Palpitations    a. 04/2020 [facility]: Avg HR 75. 429 SVT episodes, longest 19 secs @ 133. Occas PACs (3.2%). Rare PVCs (<1%).   Polymyalgia rheumatica syndrome (HCC)    Reactive airway disease    Severe sepsis ([place])     Past Surgical History:  Procedure Laterality Date   ABDOMINAL HYSTERECTOMY  1981   prolapse and bleeding, ovaries not removed   BREAST EXCISIONAL BIOPSY Right    CHOLECYSTECTOMY N/A 09/02/2019   Procedure: LAPAROSCOPIC CHOLECYSTECTOMY WITH INTRAOPERATIVE CHOLANGIOGRAM;  Surgeon: Olean Ree, MD;  Location: [facility] ORS;  Service: General;  Laterality: N/A;   CORONARY STENT INTERVENTION N/A 10/08/2018   Procedure: CORONARY STENT INTERVENTION;  Surgeon: Wellington Hampshire, MD;  Location: [facility];  Service: Cardiovascular;  Laterality: N/A;   ENDOSCOPIC RETROGRADE CHOLANGIOPANCREATOGRAPHY (ERCP) WITH PROPOFOL N/A 08/08/2019   Procedure: ENDOSCOPIC RETROGRADE CHOLANGIOPANCREATOGRAPHY (ERCP) WITH PROPOFOL;  Surgeon: Lucilla Lame, MD;  Location: [facility] ENDOS[facility];  Service: Endoscopy;  Laterality: N/A;   LEFT HEART CATH AND CORONARY ANGIOGRAPHY N/A 10/08/2018   Procedure: LEFT HEART CATH AND CORONARY ANGIOGRAPHY;  Surgeon: Wellington Hampshire, MD;  Location:  [facility]IVE [facility] LAB;  Service: Cardiovascular;  Laterality: N/A;   LEFT HEART CATH AND CORONARY ANGIOGRAPHY N/A 08/10/2020   Procedure: LEFT HEART CATH AND CORONARY ANGIOGRAPHY possible percutaneous intervention;  Surgeon: Iran Ouch, MD;  Location: [facility]IVE [facility] LAB;  Service:  Cardiovascular;  Laterality: N/A;   UMBILICAL HERNIA REPAIR  7/94   Family History:  Family History  Problem Relation Age of Onset   Arthritis Mother    Heart disease Mother    Heart attack Father    Throat cancer Sister    Parkinson's disease Sister    COPD Brother    COPD Brother    Family Psychiatric  History: none Social History:  Social History   Substance and Sexual Activity  Alcohol Use No   Alcohol/week: 0.0 standard drinks of alcohol     Social History   Substance and Sexual Activity  Drug Use No    Social History   Socioeconomic History   Marital status: Widowed    Spouse name: Not on file   Number of children: 3   Years of education: Not on file   Highest education level: Not on file  Occupational History   Not on file  Tobacco Use   Smoking status: Never   Smokeless tobacco: Never  Vaping Use   Vaping Use: Never used  Substance and Sexual Activity   Alcohol use: No    Alcohol/week: 0.0 standard drinks of alcohol   Drug use: No   Sexual activity: Not Currently  Other Topics Concern   Not on file  Social History Narrative   No smoking; no alcohol; in [place]; worked in Designer, fashion/clothing. Lives by self in [place]. Does all of her own housework. Dtr does food shopping for her.   Social Determinants of Health   Financial Resource Strain: Low Risk  (06/04/2021)   Overall Financial Resource Strain (CARDIA)    Difficulty of Paying Living Expenses: Not hard at all  Food Insecurity: No Food Insecurity (06/04/2021)   Hunger Vital Sign    Worried About Running Out of Food in the Last Year: Never true    Ran Out of Food in the Last Year: Never true  Transportation Needs: No Transportation Needs (06/04/2021)   PRAPARE - Administrator, Civil Service (Medical): No    Lack of Transportation (Non-Medical): No  Physical Activity: Insufficiently Active (01/07/2019)   Exercise Vital Sign    Days of Exercise per Week: 2 days    Minutes of Exercise per  Session: 10 min  Stress: No Stress Concern Present (04/27/2021)   [employer] of Occupational Health - Occupational Stress Questionnaire    Feeling of Stress : Not at all  Social Connections: Moderately Integrated (04/27/2021)   Social Connection and Isolation Panel [NHANES]    Frequency of Communication with Friends and Family: More than three times a week    Frequency of Social Gatherings with Friends and Family: More than three times a week    Attends Religious Services: More than 4 times per year    Active Member of [employer] or Organizations: Yes    Attends Banker Meetings: Not on file    Marital Status: Widowed   Additional Social History:    Allergies:   Allergies  Allergen Reactions   Tramadol Itching and Nausea And Vomiting    Labs:  Results for orders placed or performed during the hospital encounter of 05/06/22 (from the past 48 hour(s))  Comprehensive metabolic panel  Status: Abnormal   Collection Time: 05/06/22  9:21 AM  Result Value Ref Range   Sodium 141 135 - 145 mmol/L   Potassium 3.9 3.5 - 5.1 mmol/L    Comment: HEMOLYSIS AT THIS LEVEL MAY AFFECT RESULT   Chloride 108 98 - 111 mmol/L   CO2 22 22 - 32 mmol/L   Glucose, Bld 163 (H) 70 - 99 mg/dL    Comment: Glucose reference range applies only to samples taken after fasting for at least 8 hours.   BUN 19 8 - 23 mg/dL   Creatinine, Ser 0.69 0.44 - 1.00 mg/dL   Calcium 8.9 8.9 - 10.3 mg/dL   Total Protein 7.0 6.5 - 8.1 g/dL   Albumin 4.2 3.5 - 5.0 g/dL   AST 21 15 - 41 U/L   ALT 16 0 - 44 U/L   Alkaline Phosphatase 52 38 - 126 U/L   Total Bilirubin 1.1 0.3 - 1.2 mg/dL   GFR, Estimated >60 >60 mL/min    Comment: (NOTE) Calculated using the CKD-EPI Creatinine Equation (2021)    Anion gap 11 5 - 15    Comment: Performed at [facility], [address] [place], [place] [address]  CBC     Status: Abnormal   Collection Time: 05/06/22  9:21 AM  Result Value Ref Range   WBC 12.0  (H) 4.0 - 10.5 K/uL   RBC 4.31 3.87 - 5.11 MIL/uL   Hemoglobin 12.4 12.0 - 15.0 g/dL   HCT 38.4 36.0 - 46.0 %   MCV 89.1 80.0 - 100.0 fL   MCH 28.8 26.0 - 34.0 pg   MCHC 32.3 30.0 - 36.0 g/dL   RDW 13.6 11.5 - 15.5 %   Platelets 250 150 - 400 K/uL   nRBC 0.0 0.0 - 0.2 %    Comment: Performed at [facility], [address] [place], [place] [address]  Resp Panel by RT-PCR (Flu A&B, Covid) Anterior Nasal Swab     Status: None   Collection Time: 05/06/22  9:21 AM   Specimen: Anterior Nasal Swab  Result Value Ref Range   SARS Coronavirus 2 by RT PCR NEGATIVE NEGATIVE    Comment: (NOTE) SARS-CoV-2 target nucleic acids are NOT DETECTED.  The SARS-CoV-2 RNA is generally detectable in upper respiratory specimens during the acute phase of infection. The lowest concentration of SARS-CoV-2 viral copies this assay can detect is 138 copies/mL. A negative result does not preclude SARS-Cov-2 infection and should not be used as the sole basis for treatment or other patient management decisions. A negative result may occur with  improper specimen collection/handling, submission of specimen other than nasopharyngeal swab, presence of viral mutation(s) within the areas targeted by this assay, and inadequate number of viral copies(<138 copies/mL). A negative result must be combined with clinical observations, patient history, and epidemiological information. The expected result is Negative.  Fact Sheet for Patients:  EntrepreneurPulse.com.au  Fact Sheet for Healthcare Providers:  IncredibleEmployment.be  This test is no t yet approved or cleared by the [place] FDA and  has been authorized for detection and/or diagnosis of SARS-CoV-2 by FDA under an Emergency Use Authorization (EUA). This EUA will remain  in effect (meaning this test can be used) for the duration of the COVID-19 declaration under Section 564(b)(1) of the Act, 21 U.S.C.section  360bbb-3(b)(1), unless the authorization is terminated  or revoked sooner.       Influenza A by PCR NEGATIVE NEGATIVE   Influenza B by PCR NEGATIVE  NEGATIVE    Comment: (NOTE) The Xpert Xpress SARS-CoV-2/FLU/RSV plus assay is intended as an aid in the diagnosis of influenza from Nasopharyngeal swab specimens and should not be used as a sole basis for treatment. Nasal washings and aspirates are unacceptable for Xpert Xpress SARS-CoV-2/FLU/RSV testing.  Fact Sheet for Patients: EntrepreneurPulse.com.au  Fact Sheet for Healthcare Providers: IncredibleEmployment.be  This test is not yet approved or cleared by the [place] FDA and has been authorized for detection and/or diagnosis of SARS-CoV-2 by FDA under an Emergency Use Authorization (EUA). This EUA will remain in effect (meaning this test can be used) for the duration of the COVID-19 declaration under Section 564(b)(1) of the Act, 21 U.S.C. section 360bbb-3(b)(1), unless the authorization is terminated or revoked.  Performed at [facility], [address] [place], [place] [address]   Urinalysis, Complete w Microscopic Urine, Catheterized     Status: Abnormal   Collection Time: 05/06/22 11:43 AM  Result Value Ref Range   Color, Urine AMBER (A) YELLOW    Comment: BIOCHEMICALS MAY BE AFFECTED BY COLOR   APPearance HAZY (A) CLEAR   Specific Gravity, Urine 1.028 1.005 - 1.030   pH 5.0 5.0 - 8.0   Glucose, UA NEGATIVE NEGATIVE mg/dL   Hgb urine dipstick NEGATIVE NEGATIVE   Bilirubin Urine NEGATIVE NEGATIVE   Ketones, ur 5 (A) NEGATIVE mg/dL   Protein, ur >=300 (A) NEGATIVE mg/dL   Nitrite NEGATIVE NEGATIVE   Leukocytes,Ua NEGATIVE NEGATIVE   RBC / HPF 0-5 0 - 5 RBC/hpf   WBC, UA 0-5 0 - 5 WBC/hpf   Bacteria, UA FEW (A) NONE SEEN   Squamous Epithelial / LPF 0-5 0 - 5   Mucus PRESENT    Amorphous Crystal PRESENT     Comment: Performed at [facility], [address] [place], [name] [address]  Glucose, capillary     Status: Abnormal   Collection Time: 05/06/22  5:04 PM  Result Value Ref Range   Glucose-Capillary 178 (H) 70 - 99 mg/dL    Comment: Glucose reference range applies only to samples taken after fasting for at least 8 hours.  Glucose, capillary     Status: Abnormal   Collection Time: 05/06/22  8:48 PM  Result Value Ref Range   Glucose-Capillary 140 (H) 70 - 99 mg/dL    Comment: Glucose reference range applies only to samples taken after fasting for at least 8 hours.  Basic metabolic panel     Status: Abnormal   Collection Time: 05/07/22  4:41 AM  Result Value Ref Range   Sodium 141 135 - 145 mmol/L   Potassium 3.2 (L) 3.5 - 5.1 mmol/L   Chloride 108 98 - 111 mmol/L   CO2 21 (L) 22 - 32 mmol/L   Glucose, Bld 185 (H) 70 - 99 mg/dL    Comment: Glucose reference range applies only to samples taken after fasting for at least 8 hours.   BUN 14 8 - 23 mg/dL   Creatinine, Ser 0.65 0.44 - 1.00 mg/dL   Calcium 8.9 8.9 - 10.3 mg/dL   GFR, Estimated >60 >60 mL/min    Comment: (NOTE) Calculated using the CKD-EPI Creatinine Equation (2021)    Anion gap 12 5 - 15    Comment: Performed at [facility], [address] [place], [place] [address]  CBC     Status: Abnormal   Collection Time: 05/07/22  4:41 AM  Result Value Ref Range   WBC 11.8 (H) 4.0 -  10.5 K/uL   RBC 4.74 3.87 - 5.11 MIL/uL   Hemoglobin 13.7 12.0 - 15.0 g/dL   HCT 16.1 09.6 - 04.5 %   MCV 88.4 80.0 - 100.0 fL   MCH 28.9 26.0 - 34.0 pg   MCHC 32.7 30.0 - 36.0 g/dL   RDW 40.9 81.1 - 91.4 %   Platelets 247 150 - 400 K/uL   nRBC 0.0 0.0 - 0.2 %    Comment: Performed at [facility], [address], [place], [place] [address]  Folate     Status: None   Collection Time: 05/07/22  4:41 AM  Result Value Ref Range   Folate >40.0 >5.9 ng/mL    Comment: Performed at [facility], [address] [place], [place] [address]  RPR     Status:  None   Collection Time: 05/07/22  4:41 AM  Result Value Ref Range   RPR Ser Ql NON REACTIVE NON REACTIVE    Comment: Performed at [facility], [address], [place], [place] [address]  Vitamin B12     Status: None   Collection Time: 05/07/22  4:41 AM  Result Value Ref Range   Vitamin B-12 620 180 - 914 pg/mL    Comment: (NOTE) This assay is not validated for testing neonatal or myeloproliferative syndrome specimens for Vitamin B12 levels. Performed at [facility], [address] [place], [place] [address]   TSH     Status: None   Collection Time: 05/07/22  4:41 AM  Result Value Ref Range   TSH 2.064 0.350 - 4.500 uIU/mL    Comment: Performed by a 3rd Generation assay with a functional sensitivity of <=0.01 uIU/mL. Performed at [facility], [address] [place], [place] [address]   Glucose, capillary     Status: Abnormal   Collection Time: 05/07/22  7:24 AM  Result Value Ref Range   Glucose-Capillary 188 (H) 70 - 99 mg/dL    Comment: Glucose reference range applies only to samples taken after fasting for at least 8 hours.    Current Facility-Administered Medications  Medication Dose Route Frequency Provider Last Rate Last Admin   0.9 %  sodium chloride infusion  250 mL Intravenous PRN Agbata, Tochukwu, MD       acetaminophen (TYLENOL) tablet 500-1,000 mg  500-1,000 mg Oral Q6H PRN Agbata, Tochukwu, MD       aspirin chewable tablet 81 mg  81 mg Oral Daily Agbata, Tochukwu, MD   81 mg at 05/07/22 0916   atorvastatin (LIPITOR) tablet 80 mg  80 mg Oral Daily Agbata, Tochukwu, MD       diltiazem (CARDIZEM) 125 mg in dextrose 5% 125 mL (1 mg/mL) infusion  5-15 mg/hr Intravenous Continuous Lindajo Royal V, MD 5 mL/hr at 05/07/22 0854 5 mg/hr at 05/07/22 0854   enoxaparin (LOVENOX) injection 40 mg  40 mg Subcutaneous Q24H Agbata, Tochukwu, MD   40 mg at 05/06/22 1443   folic acid (FOLVITE) tablet 1 mg  1 mg Oral Daily Agbata, Tochukwu, MD   1 mg at 05/07/22  0916   insulin aspart (novoLOG) injection 0-15 Units  0-15 Units Subcutaneous TID WC Agbata, Tochukwu, MD   3 Units at 05/07/22 0804   ipratropium-albuterol (DUONEB) 0.5-2.5 (3) MG/3ML nebulizer solution 3 mL  3 mL Nebulization BID PRN Agbata, Tochukwu, MD       metoprolol tartrate (LOPRESSOR) tablet 25 mg  25 mg Oral Q6H Lynn Ito, MD   25 mg  at 05/07/22 0726   mometasone-formoterol (DULERA) 200-5 MCG/ACT inhaler 2 puff  2 puff Inhalation BID Agbata, Tochukwu, MD   2 puff at 05/07/22 0805   multivitamin-lutein (OCUVITE-LUTEIN) capsule 1 capsule  1 capsule Oral BID Agbata, Tochukwu, MD       nitroGLYCERIN (NITROSTAT) SL tablet 0.4 mg  0.4 mg Sublingual Q5 min PRN Agbata, Tochukwu, MD       ondansetron (ZOFRAN) tablet 4 mg  4 mg Oral Q6H PRN Agbata, Tochukwu, MD       Or   ondansetron (ZOFRAN) injection 4 mg  4 mg Intravenous Q6H PRN Agbata, Tochukwu, MD       QUEtiapine (SEROQUEL) tablet 25 mg  25 mg Oral BID Sirinity Outland Y, NP       sodium chloride flush (NS) 0.9 % injection 3 mL  3 mL Intravenous Q12H Agbata, Tochukwu, MD   3 mL at 05/07/22 0955   sodium chloride flush (NS) 0.9 % injection 3 mL  3 mL Intravenous Q12H Agbata, Tochukwu, MD   3 mL at 05/07/22 0955   sodium chloride flush (NS) 0.9 % injection 3 mL  3 mL Intravenous PRN Agbata, Tochukwu, MD       Vitamin D (Ergocalciferol) (DRISDOL) capsule 50,000 Units  50,000 Units Oral Q Fri Collier Bullock, MD        Musculoskeletal: Strength & Muscle Tone: within normal limits Gait & Station: normal Patient leans: N/A  Psychiatric Specialty Exam: Physical Exam Vitals and nursing note reviewed.  Constitutional:      Appearance: Normal appearance.  HENT:     Head: Normocephalic.     Nose: Nose normal.  Pulmonary:     Effort: Pulmonary effort is normal.  Musculoskeletal:        General: Normal range of motion.     Cervical back: Normal range of motion.  Neurological:     General: No focal deficit present.     Mental Status:  She is alert.  Psychiatric:        Attention and Perception: Attention normal. She perceives visual hallucinations.        Mood and Affect: Affect is blunt.        Speech: Speech normal.        Behavior: Behavior normal. Behavior is cooperative.        Thought Content: Thought content is delusional.        Cognition and Memory: Cognition and memory normal.        Judgment: Judgment normal.     Review of Systems  Musculoskeletal:  Positive for myalgias.  Psychiatric/Behavioral:  Positive for hallucinations. The patient is nervous/anxious.   All other systems reviewed and are negative.   Blood pressure (!) 135/57, pulse (!) 107, temperature 97.9 F (36.6 C), temperature source Oral, resp. rate (!) 29, height 5\' 1"  (1.549 m), weight 57.4 kg, SpO2 97 %.Body mass index is 23.91 kg/m.  General Appearance: Casual  Eye Contact:  Good  Speech:  Normal Rate  Volume:  Normal  Mood:  Anxious  Affect:  Blunt  Thought Process:  Coherent and Descriptions of Associations: Intact  Orientation:  Full (Time, Place, and Person)  Thought Content:  Logical  Suicidal Thoughts:  No  Homicidal Thoughts:  No  Memory:  Immediate;   Fair Recent;   Fair Remote;   Fair  Judgement:  Fair  Insight:  Fair  Psychomotor Activity:  Decreased  Concentration:  Concentration: Fair and Attention Span: Fair  Recall:  [employer]  of Knowledge:  Good  Language:  Good  Akathisia:  No  Handed:  Right  AIMS (if indicated):     Assets:  Housing Leisure Time Resilience Social Support  ADL's:  Intact  Cognition:  WNL  Sleep:        Physical Exam: Physical Exam Vitals and nursing note reviewed.  Constitutional:      Appearance: Normal appearance.  HENT:     Head: Normocephalic.     Nose: Nose normal.  Pulmonary:     Effort: Pulmonary effort is normal.  Musculoskeletal:        General: Normal range of motion.     Cervical back: Normal range of motion.  Neurological:     General: No focal deficit  present.     Mental Status: She is alert.  Psychiatric:        Attention and Perception: Attention normal. She perceives visual hallucinations.        Mood and Affect: Affect is blunt.        Speech: Speech normal.        Behavior: Behavior normal. Behavior is cooperative.        Thought Content: Thought content is delusional.        Cognition and Memory: Cognition and memory normal.        Judgment: Judgment normal.    Review of Systems  Musculoskeletal:  Positive for myalgias.  Psychiatric/Behavioral:  Positive for hallucinations. The patient is nervous/anxious.   All other systems reviewed and are negative.  Blood pressure (!) 135/57, pulse (!) 107, temperature 97.9 F (36.6 C), temperature source Oral, resp. rate (!) 29, height 5\' 1"  (1.549 m), weight 57.4 kg, SpO2 97 %. Body mass index is 23.91 kg/m.  Treatment Plan Summary: Delirium with hallucinations. Discontinued Seroquel Started Ativan 0.5 mg BID PRN  Patient likely has ongoing delirium which presents as fluctuating cognition related to medical issues.   Disposition: Patient does not meet criteria for psychiatric inpatient admission. Supportive therapy provided about ongoing stressors.  Waylan Boga, NP 05/07/2022 10:28 AM

## 2022-05-07 NOTE — Progress Notes (Signed)
Patient's b/p elevated in the 170's/60's and has no prns and does not have any night coverage for blood sugars. Notified on-call Physician - orders were placed to address the above. Later this evening

## 2022-05-07 NOTE — Progress Notes (Signed)
PROGRESS NOTE    [place] Sotomayor  IZT:[id number] DOB: 02-07-1933 DOA: 05/06/2022 PCP: Dale Evergreen, MD    Brief Narrative:  Tanya Cole is a 88 y.o. female with medical history significant for diabetes mellitus, chronic diastolic dysfunction CHF, coronary artery disease, hypertension, polymyalgia rheumatica, rheumatoid arthritis who was brought into the ER by EMS for evaluation of visual and auditory hallucinations. Most of the history was obtained from patient's daughter who states that over the last 1 week patient has had hallucinations which have progressively worsened.  She was admitted about a month ago for same but at that time had pneumonia.  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  Patient's prednisone has since been discontinued by rheumatology.   Patient states that she lives alone and notes that she has become increasingly forgetful.   7/8  still confused. In afib rvr, cardiology consulted. Overnight on cardizem gtt for afib rvr  Consultants:  Psych  Procedures:   Antimicrobials:      Subjective:  confused  Objective: Vitals:   05/07/22 0804 05/07/22 0815 05/07/22 0827 05/07/22 0830  BP:  (!) 129/38 130/71 116/69  Pulse: 82 76 65   Resp: (!) 26 (!) 26 (!) 27 (!) 22  Temp:      TempSrc:      SpO2: 96% 96% 96% 97%  Weight:      Height:        Intake/Output Summary (Last 24 hours) at 05/07/2022 0847 Last data filed at 05/07/2022 0800 Gross per 24 hour  Intake --  Output 0 ml  Net 0 ml   Filed Weights   05/06/22 0918  Weight: 57.4 kg    Examination: Calm, NAD Cta no w/r Irreg s1/s2 no gallop Soft benign +bs No edema Awake, confused. Unable to do full assessment Mood and affect appropriate in current setting     Data Reviewed: I have personally reviewed following labs and imaging studies  CBC: Recent Labs  Lab 05/06/22 0921 05/07/22 0441  WBC 12.0* 11.8*  HGB 12.4 13.7  HCT 38.4 41.9  MCV 89.1 88.4  PLT 250 247    Basic Metabolic Panel: Recent Labs  Lab 05/06/22 0921 05/07/22 0441  NA 141 141  K 3.9 3.2*  CL 108 108  CO2 22 21*  GLUCOSE 163* 185*  BUN 19 14  CREATININE 0.69 0.65  CALCIUM 8.9 8.9   GFR: Estimated Creatinine Clearance: 41.1 mL/min (by C-G formula based on SCr of 0.65 mg/dL). Liver Function Tests: Recent Labs  Lab 05/06/22 0921  AST 21  ALT 16  ALKPHOS 52  BILITOT 1.1  PROT 7.0  ALBUMIN 4.2   No results for input(s): "LIPASE", "AMYLASE" in the last 168 hours. No results for input(s): "AMMONIA" in the last 168 hours. Coagulation Profile: No results for input(s): "INR", "PROTIME" in the last 168 hours. Cardiac Enzymes: No results for input(s): "CKTOTAL", "CKMB", "CKMBINDEX", "TROPONINI" in the last 168 hours. BNP (last 3 results) No results for input(s): "PROBNP" in the last 8760 hours. HbA1C: No results for input(s): "HGBA1C" in the last 72 hours. CBG: Recent Labs  Lab 05/06/22 1704 05/06/22 2048 05/07/22 0724  GLUCAP 178* 140* 188*   Lipid Profile: No results for input(s): "CHOL", "HDL", "LDLCALC", "TRIG", "CHOLHDL", "LDLDIRECT" in the last 72 hours. Thyroid Function Tests: No results for input(s): "TSH", "T4TOTAL", "FREET4", "T3FREE", "THYROIDAB" in the last 72 hours. Anemia Panel: No results for input(s): "VITAMINB12", "FOLATE", "FERRITIN", "TIBC", "IRON", "RETICCTPCT" in the last 72 hours. Sepsis Labs:  No results for input(s): "PROCALCITON", "LATICACIDVEN" in the last 168 hours.  Recent Results (from the past 240 hour(s))  Resp Panel by RT-PCR (Flu A&B, Covid) Anterior Nasal Swab     Status: None   Collection Time: 05/06/22  9:21 AM   Specimen: Anterior Nasal Swab  Result Value Ref Range Status   SARS Coronavirus 2 by RT PCR NEGATIVE NEGATIVE Final    Comment: (NOTE) SARS-CoV-2 target nucleic acids are NOT DETECTED.  The SARS-CoV-2 RNA is generally detectable in upper respiratory specimens during the acute phase of infection. The  lowest concentration of SARS-CoV-2 viral copies this assay can detect is 138 copies/mL. A negative result does not preclude SARS-Cov-2 infection and should not be used as the sole basis for treatment or other patient management decisions. A negative result may occur with  improper specimen collection/handling, submission of specimen other than nasopharyngeal swab, presence of viral mutation(s) within the areas targeted by this assay, and inadequate number of viral copies(<138 copies/mL). A negative result must be combined with clinical observations, patient history, and epidemiological information. The expected result is Negative.  Fact Sheet for Patients:  BloggerCourse.com  Fact Sheet for Healthcare Providers:  SeriousBroker.it  This test is no t yet approved or cleared by the [place] FDA and  has been authorized for detection and/or diagnosis of SARS-CoV-2 by FDA under an Emergency Use Authorization (EUA). This EUA will remain  in effect (meaning this test can be used) for the duration of the COVID-19 declaration under Section 564(b)(1) of the Act, 21 U.S.C.section 360bbb-3(b)(1), unless the authorization is terminated  or revoked sooner.       Influenza A by PCR NEGATIVE NEGATIVE Final   Influenza B by PCR NEGATIVE NEGATIVE Final    Comment: (NOTE) The Xpert Xpress SARS-CoV-2/FLU/RSV plus assay is intended as an aid in the diagnosis of influenza from Nasopharyngeal swab specimens and should not be used as a sole basis for treatment. Nasal washings and aspirates are unacceptable for Xpert Xpress SARS-CoV-2/FLU/RSV testing.  Fact Sheet for Patients: BloggerCourse.com  Fact Sheet for Healthcare Providers: SeriousBroker.it  This test is not yet approved or cleared by the [place] FDA and has been authorized for detection and/or diagnosis of SARS-CoV-2 by FDA under  an Emergency Use Authorization (EUA). This EUA will remain in effect (meaning this test can be used) for the duration of the COVID-19 declaration under Section 564(b)(1) of the Act, 21 U.S.C. section 360bbb-3(b)(1), unless the authorization is terminated or revoked.  Performed at [facility], [address], [place], [place] [address]          Radiology Studies: DG Chest Portable 1 View  Result Date: 05/06/2022 CLINICAL DATA:  Altered mental status EXAM: PORTABLE CHEST 1 VIEW COMPARISON:  04/15/2022 chest radiograph. FINDINGS: Stable cardiomediastinal silhouette with normal heart size. No pneumothorax. No pleural effusion. No pulmonary edema. Stable calcified left lung base granuloma. Stable streaky lingular scarring. No acute consolidative airspace disease. IMPRESSION: 1. No active cardiopulmonary disease. 2. Stable streaky lingular scarring. Electronically Signed   By: Delbert Phenix M.D.   On: 05/06/2022 09:57        Scheduled Meds:  aspirin  81 mg Oral Daily   atorvastatin  80 mg Oral Daily   enoxaparin (LOVENOX) injection  40 mg Subcutaneous Q24H   folic acid  1 mg Oral Daily   insulin aspart  0-15 Units Subcutaneous TID WC   metoprolol tartrate  25 mg Oral Q6H   mometasone-formoterol  2 puff  Inhalation BID   multivitamin-lutein  1 capsule Oral BID   sodium chloride flush  3 mL Intravenous Q12H   sodium chloride flush  3 mL Intravenous Q12H   Vitamin D (Ergocalciferol)  50,000 Units Oral Q Fri   Continuous Infusions:  sodium chloride     diltiazem (CARDIZEM) infusion 10 mg/hr (05/07/22 0827)    Assessment & Plan:   Principal Problem:   Hallucinations Active Problems:   Polymyalgia rheumatica syndrome (HCC)   Hypertension   Diabetes mellitus (HCC)   CAD (coronary artery disease)   Paroxysmal atrial fibrillation with RVR (HCC)   Hallucinations Patient presents to the ER via EMS for evaluation of visual and auditory hallucinations She was recently  hospitalized for same and at that time her symptoms were attributed to steroid-induced psychosis because she had been on prednisone for her arthritis. Patient has been off prednisone for over a week and now has symptoms. Her labs are within normal limits and vital signs are stable Concern for possible dementia 7/8 psych input was appreciated. Dc seroquel. Started on ativan 0.5mg  bid prn.     Polymyalgia rheumatica syndrome (HCC) Stable and not acutely exacerbated 7/8 continue methotrexate weekly and folic acid   PAF/PAT Now with rvr On iv cardizem HR not controlled, started on beta blk.  Now AT Cards consulted, input was appreciated- no good a/c currently concerns of her medical issues currently and will defer for now. Chadsvac 7. Added amiodarone     Hypertension Stable continue beta-blocker     Diabetes mellitus (HCC) Hold metformin Maintain consistent carbohydrate diet Glycemic control sliding scale     CAD (coronary artery disease) Stable Continue aspirin, atorvastatin and metoprolol     DVT prophylaxis: lovenox Code Status:DNR Family Communication: family at bedside Disposition Plan:  Status is: Observation The patient remains OBS appropriate and will d/c before 2 midnights.        LOS: 0 days   Time spent: 35 min    Lynn Ito, MD [facility] Pager [phone]  If 7PM-7AM, please contact night-coverage 05/07/2022, 8:47 AM

## 2022-05-07 NOTE — Plan of Care (Addendum)
Floor coverage note   Patient with rapid A-fib with rate spike into the 190s and then back to the 160s  Chart review reveals past discharge summary with documentation of history of paroxysmal A-fib.  Patient currently on metoprolol 50 mg twice daily She is currently asymptomatic and without chest pain or shortness of breath  Paroxysmal atrial fibrillation with RVR - Diltiazem bolus and load ordered.  Problem list updated -Patient is not currently on anticoagulation for unclear reason -Will DC home oral metoprolol

## 2022-05-07 NOTE — Progress Notes (Signed)
PO metoprolol and PO potassium replacement ordered per verbal orders with MD S. Amery on the phone.  Getting BP q46min

## 2022-05-07 NOTE — Consult Note (Addendum)
Cardiology Consult    [name] MRN: [id number], DOB/AGE: April 15, 1933   Admit date: 05/06/2022 Date of Consult: 05/07/2022  Primary Physician: Tanya Pheasant, MD Primary Cardiologist: Tanya Sacramento, MD Requesting Provider: Chauncey Cruel. Kurtis Bushman, MD  Patient Profile    Tanya Cole is a 88 y.o. female with a history of CAD, HFpEF, diabetes, hypertension, hyperlipidemia, palpitations, paroxysmal atrial tachycardia, polymyalgia rheumatica, and asthma/COPD, who is being seen today for the evaluation of atrial fibrillation with rapid ventricular response at the request of Dr. Kurtis Cole.  Past Medical History   Past Medical History:  Diagnosis Date   (HFpEF) heart failure with preserved ejection fraction ([place])    a. TTE 12/19: EF 55-60%, probable HK of the mid apical anterior septal myocardium, Gr1DD, mild AI, mildly dilated LA; b.07/2020 Echo: EF 60-65%, no rwma, Gr2 DD. Nl RV fxn. Mildly dil LA. Mild MR; c. 10/2021 Echo: EF 60-65%, no rwma, mild LVH, nl RV fxn, mod elev PASP, mildly dil LA, mild MR, Ao sclerosis.   Asthma    CAD (coronary artery disease)    a. 09/2018 NSTEMI/PCI: LM min irregs, mLAD 95 (PCI/DES), mLAD-2 60%, LCx mild diff dzs, RCA min irregs; b. 03/2020 MV: EF>65%, no ischemia/scar; c. 07/2020 Cath: LM min irregs, LAD 30p, 67m 90d, D1/2 min irregs, LCX diff dzs throughout, OM1/2/3 mild dzs, RCA 30p, RPDA/RPAV min irrges. EF 55-65%.   CHF (congestive heart failure) ([place]    Diabetes mellitus ([place]    Hypercholesterolemia    Hypertension    Myocardial infarction ([place]    Osteopenia    Palpitations    a. 04/2020 [facility]: Avg HR 75. 429 SVT episodes, longest 19 secs @ 133. Occas PACs (3.2%). Rare PVCs (<1%).   Polymyalgia rheumatica syndrome (HCC)    Reactive airway disease    Severe sepsis ([place]     Past Surgical History:  Procedure Laterality Date   ABDOMINAL HYSTERECTOMY  1981   prolapse and bleeding, ovaries not removed   BREAST EXCISIONAL BIOPSY Right     CHOLECYSTECTOMY N/A 09/02/2019   Procedure: LAPAROSCOPIC CHOLECYSTECTOMY WITH INTRAOPERATIVE CHOLANGIOGRAM;  Surgeon: POlean Ree MD;  Location: [facility] ORS;  Service: General;  Laterality: N/A;   CORONARY STENT INTERVENTION N/A 10/08/2018   Procedure: CORONARY STENT INTERVENTION;  Surgeon: AWellington Hampshire MD;  [facility];  Service: Cardiovascular;  Laterality: N/A;   ENDOSCOPIC RETROGRADE CHOLANGIOPANCREATOGRAPHY (ERCP) WITH PROPOFOL N/A 08/08/2019   Procedure: ENDOSCOPIC RETROGRADE CHOLANGIOPANCREATOGRAPHY (ERCP) WITH PROPOFOL;  Surgeon: WLucilla Lame MD;  Location: [facility] ENDOSCOPY;  Service: Endoscopy;  Laterality: N/A;   LEFT HEART CATH AND CORONARY ANGIOGRAPHY N/A 10/08/2018   Procedure: LEFT HEART CATH AND CORONARY ANGIOGRAPHY;  Surgeon: AWellington Hampshire MD;  [facility];  Service: Cardiovascular;  Laterality: N/A;   LEFT HEART CATH AND CORONARY ANGIOGRAPHY N/A 08/10/2020   Procedure: LEFT HEART CATH AND CORONARY ANGIOGRAPHY possible percutaneous intervention;  Surgeon: AWellington Hampshire MD;  [facility];  Service: Cardiovascular;  Laterality: N/A;   UMBILICAL HERNIA REPAIR  7/94     Allergies  Allergies  Allergen Reactions   Tramadol Itching and Nausea And Vomiting    History of Present Illness    88year old female with above past medical history including CAD, HFpEF, diabetes, hypertension, hyperlipidemia, palpitations, paroxysmal atrial tachycardia, polymyalgia rheumatica, and asthma/COPD.  She suffered a non-STEMI December 2019, and underwent PCI and drug-eluting stent placement to the mid LAD.  In early 2021, she was diagnosed with  blood loss anemia with drop in hemoglobin to 8.4, requiring discontinuation of clopidogrel.  In October 2021, she suffered a non-STEMI and catheterization showed a patent proximal LAD stent with mild in-stent restenosis, 6% mid LAD stenosis, and 90% apical LAD stenosis.  The apical LAD was felt to  be the culprit, but was not amenable to PCI given distal location and medical therapy was recommended.  Few weeks later, she returned to the emergency department with chest pain and dyspnea.  Troponin was normal.  She was noted at that time to have intermittent episodes of atrial tachycardia and frequent PACs requiring adjustment of beta-blocker.  In January 2023, she was admitted with possible polymyalgia rheumatica syndrome.  Echocardiogram during admission showed an EF of 60 to 65% with mild LVH, normal RV function, moderately elevated PASP, mild MR/MS, and aortic valve sclerosis.  She was readmitted March 2023 with dyspnea, chills, and angina.  Troponins were relatively normal at 17  18  20.  ECG was unremarkable.  She was managed for possible pneumonia by the medicine team.  Tanya Cole was last seen in cardiology clinic on May 30, at which time she was doing well.  Family notes that over the past month, she has been having auditory and visual hallucinations.  She was admitted in June with altered mental status/delirium.  MRI of the brain was without acute issues.  She was seen by psychiatry and placed on Seroquel twice daily.  There was some concern that symptoms may have been related to steroid-induced psychosis in the setting of chronic prednisone therapy, and prednisone was discontinued.  She was subsequently discharged June 7 however, per family, she is continued to have hallucinations.  Family has concerns about her living by herself now.  On July 7, family noted significant auditory and visual hallucinations, prompting return to the emergency department.  Here, labs notable for mild leukocytosis at 12.  Chest x-ray without active cardiopulmonary disease.  Initial ECG shows sinus rhythm at 62 without acute ST or T changes.  Patient was seen by the medicine team and admitted.  On telemetry, she was noted to have narrow complex tachycardia and subsequently developed atrial fibrillation at a rate of 176 bpm  at 3:44 AM.  She was placed on intravenous diltiazem and rhythm has since vacillated between sinus rhythm and atrial tachycardia.  Patient has been intermittently agitated stating that she is being held against her will in a wheelchair, which is in a ditch.  Despite this sentiment, she is aware that she is at [facility] and that today is May 07, 2022.  She has no current cardiac complaints.  Family at bedside and they note concern regarding her either missing or taking too much medication given recent forgetfulness.  They have concerns about oral anticoagulation.  Inpatient Medications     amiodarone  400 mg Oral BID   aspirin  81 mg Oral Daily   atorvastatin  80 mg Oral Daily   enoxaparin (LOVENOX) injection  40 mg Subcutaneous W11B   folic acid  1 mg Oral Daily   insulin aspart  0-15 Units Subcutaneous TID WC   metoprolol tartrate  25 mg Oral Q6H   mometasone-formoterol  2 puff Inhalation BID   multivitamin-lutein  1 capsule Oral BID   sodium chloride flush  3 mL Intravenous Q12H   sodium chloride flush  3 mL Intravenous Q12H   Vitamin D (Ergocalciferol)  50,000 Units Oral Q Fri    Family History    Family  History  Problem Relation Age of Onset   Arthritis Mother    Heart disease Mother    Heart attack Father    Throat cancer Sister    Parkinson's disease Sister    COPD Brother    COPD Brother    She indicated that the status of her mother is unknown. She indicated that the status of her father is unknown. She indicated that four of her five sisters are alive. She indicated that two of her three brothers are alive.   Social History    Social History   Socioeconomic History   Marital status: Widowed    Spouse name: Not on file   Number of children: 3   Years of education: Not on file   Highest education level: Not on file  Occupational History   Not on file  Tobacco Use   Smoking status: Never   Smokeless tobacco: Never  Vaping Use   Vaping Use: Never used   Substance and Sexual Activity   Alcohol use: No    Alcohol/week: 0.0 standard drinks of alcohol   Drug use: No   Sexual activity: Not Currently  Other Topics Concern   Not on file  Social History Narrative   No smoking; no alcohol; in [place]; worked in [employer] fundraiser. Lives by self in [place]. Does all of her own housework. Dtr does food shopping for her.   Social Determinants of Health   Financial Resource Strain: Low Risk  (06/04/2021)   Overall Financial Resource Strain (CARDIA)    Difficulty of Paying Living Expenses: Not hard at all  Food Insecurity: No Food Insecurity (06/04/2021)   Hunger Vital Sign    Worried About Running Out of Food in the Last Year: Never true    Ran Out of Food in the Last Year: Never true  Transportation Needs: No Transportation Needs (06/04/2021)   PRAPARE - Hydrologist (Medical): No    Lack of Transportation (Non-Medical): No  Physical Activity: Insufficiently Active (01/07/2019)   Exercise Vital Sign    Days of Exercise per Week: 2 days    Minutes of Exercise per Session: 10 min  Stress: No Stress Concern Present (04/27/2021)   [place]    Feeling of Stress : Not at all  Social Connections: Moderately Integrated (04/27/2021)   Social Connection and Isolation Panel [NHANES]    Frequency of Communication with Friends and Family: More than three times a week    Frequency of Social Gatherings with Friends and Family: More than three times a week    Attends Religious Services: More than 4 times per year    Active Member of [employer] or Organizations: Yes    Attends Archivist Meetings: Not on file    Marital Status: Widowed  Intimate Partner Violence: Not At Risk (06/04/2021)   Humiliation, Afraid, Rape, and Kick questionnaire    Fear of Current or Ex-Partner: No    Emotionally Abused: No    Physically Abused: No    Sexually Abused: No     Review of  Systems    General:  No chills, fever, night sweats or weight changes.  Cardiovascular:  No chest pain, dyspnea on exertion, edema, orthopnea, palpitations, paroxysmal nocturnal dyspnea. Dermatological: No rash, lesions/masses Respiratory: No cough, dyspnea Urologic: No hematuria, dysuria Abdominal:   Patient reports that she was experiencing nausea on the day of admission though family denies any complaint of this.  No  vomiting, diarrhea, bright red blood per rectum, melena, or hematemesis Neurologic:  +++ Altered mental status with visual and auditory hallucinations. All other systems reviewed and are otherwise negative except as noted above.  Physical Exam    Blood pressure (!) 138/59, pulse (!) 110, temperature 98.4 F (36.9 C), temperature source Oral, resp. rate (!) 23, height _0  (1.549 m), weight 57.4 kg, SpO2 97 %.  General: Pleasant, NAD Psych: Somewhat agitated and intermittently tearful. Neuro: Alert and oriented X 3. Moves all extremities spontaneously. HEENT: Normal  Neck: Supple without bruits or JVD. Lungs:  Resp regular and unlabored, CTA. Heart: RRR, tachycardic, no s3, s4, or murmurs. Abdomen: Soft, non-tender, non-distended, BS + x 4.  Extremities: No clubbing, cyanosis or edema. DP/PT2+, Radials 2+ and equal bilaterally.  Labs       Lab Results  Component Value Date   WBC 11.8 (H) 05/07/2022   HGB 13.7 05/07/2022   HCT 41.9 05/07/2022   MCV 88.4 05/07/2022   PLT 247 05/07/2022    Recent Labs  Lab 05/06/22 0921 05/07/22 0441  NA 141 141  K 3.9 3.2*  CL 108 108  CO2 22 21*  BUN 19 14  CREATININE 0.69 0.65  CALCIUM 8.9 8.9  PROT 7.0  --   BILITOT 1.1  --   ALKPHOS 52  --   ALT 16  --   AST 21  --   GLUCOSE 163* 185*   Lab Results  Component Value Date   CHOL 112 01/26/2022   HDL 35 (L) 01/26/2022   LDLCALC 50 01/26/2022   TRIG 137 01/26/2022   Lab Results  Component Value Date   DDIMER 0.46 03/07/2022      Radiology Studies     DG Chest Portable 1 View  Result Date: 05/06/2022 CLINICAL DATA:  Altered mental status EXAM: PORTABLE CHEST 1 VIEW COMPARISON:  04/15/2022 chest radiograph. FINDINGS: Stable cardiomediastinal silhouette with normal heart size. No pneumothorax. No pleural effusion. No pulmonary edema. Stable calcified left lung base granuloma. Stable streaky lingular scarring. No acute consolidative airspace disease. IMPRESSION: 1. No active cardiopulmonary disease. 2. Stable streaky lingular scarring. Electronically Signed   By: Ilona Sorrel M.D.   On: 05/06/2022 09:57   DG Chest 2 View  Result Date: 04/16/2022 CLINICAL DATA:  88 year old female with recent hospitalization EXAM: CHEST - 2 VIEW COMPARISON:  04/02/2022 FINDINGS: Cardiomediastinal silhouette unchanged in size and contour. Evidence of prior PTC I. no evidence of central vascular congestion. No interlobular septal thickening. Focal opacity at the left lung base corresponds to a prior chest x-ray, with overall improving aeration. No pneumothorax or pleural effusion. Coarsened interstitial markings, with no confluent airspace disease. No acute displaced fracture. Degenerative changes of the spine. IMPRESSION: Improving aeration of the lungs, with no evidence of acute cardiopulmonary disease. Electronically Signed   By: Corrie Mckusick D.O.   On: 04/16/2022 11:23    ECG & Cardiac Imaging    July 7 at 9:18 AM: Regular sinus rhythm, 62, no acute ST or T changes- personally reviewed. July 8 at 3:44 AM: Atrial fibrillation, 176, diffuse ST segment depression-personally reviewed.  Assessment & Plan    1.  Paroxysmal atrial fibrillation/paroxysmal atrial tachycardia: Patient with prior history of paroxysmal atrial tachycardia managed with beta-blocker therapy in the outpatient setting, which has been quiescent for some time now.  She was admitted July 7 with progressive auditory and visual hallucinations as well as agitation and forgetfulness noted by family.  She  was initially  in sinus rhythm on arrival however, overnight, she developed narrow complex tachycardia consistent with atrial fibrillation at 176 bpm.  She was placed on IV diltiazem and has since been vacillating between sinus rhythm and atrial tachycardia.  Patient remains somewhat agitated.  Family now at bedside.  Per family, patient has been unreliably taking medications at home due to significant forgetfulness and altered mental status.  In this setting, we collectively have concerns regarding initiation of oral anticoagulation, and will thus defer.  CHA2DS2-VASc equals 7.  As she will be at high risk for recurrent atrial arrhythmias, we will add amiodarone.  In this setting, we will discontinue Seroquel, which was not apparently helping with her hallucinations.  We will have to monitor QTc closely with concomitant Haldol usage.  2.  Chronic HFpEF: EF 60 to 65% earlier this year.  Euvolemic on examination.  Addressing heart rate issues as above.  Blood pressure has been stable.  No role for diuresis at this time, though may require diuretic therapy if tachycardia persists.  3.  Essential hypertension: Stable.  4.  Hyperlipidemia: LDL of 50 earlier this year.  Continue statin therapy.  5.  Type 2 diabetes mellitus: Insulin management per internal medicine.  6.  Altered mental status/hallucinations: Per medicine/psychiatry.  Avoid medications that might interact with amiodarone.  Risk Assessment/Risk Scores:          CHA2DS2-VASc Score = 7   This indicates a 11.2% annual risk of stroke. The patient's score is based upon: CHF History: 1 HTN History: 1 Diabetes History: 1 Stroke History: 0 Vascular Disease History: 1 Age Score: 2 Gender Score: 1     Signed, Murray Hodgkins, NP 05/07/2022, 11:18 AM  For questions or updates, please contact   Please consult [email or web address] for contact info under Cardiology/STEMI.  Patient seen and examined   I agree with findings as noted by Angelica Ran  above   Pt an 88 yo with hx of CAD, HFpEF, HTN, HL, palpitations, PAT, COPD Admitted for hallucinations   Initial EKG showed multifocal atrial rhythm    SInce admit has developed afib with RVR     Pt treated with IV diltiazem  Now back in SR/ST(100s)     Talking to the pt she denies palpitations  at present   Per family she is not reliable taking meds at home    [facility] has fallen   Was unsteady in ER yseterday  On exam, Pt comfortable in bed Neck  JVP is normal  No bruits Lungs are CTA Cardiac exam:   RRR  No S3 or S4  No murmurs Abd is supple    Ext are without edema  Feet are warm     Impression:  PAF  Hx of PAT at home  On metoprolol  ? If really taking  This hospitalization is first demonstrated afib with RVR   Currently in SR , occasional atrial tach with IV diltiazem CHADSVASc is 7   Given psych issues and frailty I think she is not a candidate for anticoagulation   (only if supervised 24/7 in assisted living with individual care)  With her other medical issues and concern for compliance, the overall beset  option to maintain SR would be for amiodarone to be used.  Start load at 400 bid  Keep on diltiazem   Metoprolol started  Follow HR    I have contacted psychiatry.   Need to avoid meds that prolong QT interval       WOuld stop  Seroquel      Will continue to follow here in hosp. Note family is looking into supervised living situations for when she leaves     Dorris Carnes MD

## 2022-05-08 DIAGNOSIS — I480 Paroxysmal atrial fibrillation: Secondary | ICD-10-CM | POA: Diagnosis not present

## 2022-05-08 DIAGNOSIS — I10 Essential (primary) hypertension: Secondary | ICD-10-CM | POA: Diagnosis not present

## 2022-05-08 DIAGNOSIS — R443 Hallucinations, unspecified: Secondary | ICD-10-CM | POA: Diagnosis not present

## 2022-05-08 LAB — GLUCOSE, CAPILLARY
Glucose-Capillary: 197 mg/dL — ABNORMAL HIGH (ref 70–99)
Glucose-Capillary: 231 mg/dL — ABNORMAL HIGH (ref 70–99)
Glucose-Capillary: 227 mg/dL — ABNORMAL HIGH (ref 70–99)
Glucose-Capillary: 117 mg/dL — ABNORMAL HIGH (ref 70–99)
Glucose-Capillary: 186 mg/dL — ABNORMAL HIGH (ref 70–99)

## 2022-05-08 LAB — BASIC METABOLIC PANEL
Anion gap: 8 (ref 5–15)
BUN: 16 mg/dL (ref 8–23)
CO2: 26 mmol/L (ref 22–32)
Calcium: 9.4 mg/dL (ref 8.9–10.3)
Chloride: 109 mmol/L (ref 98–111)
Creatinine, Ser: 0.63 mg/dL (ref 0.44–1.00)
GFR, Estimated: >60 mL/min (ref >60)
Glucose, Bld: 226 mg/dL — ABNORMAL HIGH (ref 70–99)
Potassium: 3.7 mmol/L (ref 3.5–5.1)
Sodium: 143 mmol/L (ref 135–145)

## 2022-05-08 MED ORDER — POLYVINYL ALCOHOL 1.4 % OP SOLN
1.0000 [drp] | OPHTHALMIC | Status: DC | PRN
  Administered 2022-05-09: 1 [drp] via OPHTHALMIC
  Filled 2022-05-08: qty 15

## 2022-05-08 NOTE — TOC Initial Note (Signed)
Transition of Care [facility]) - Initial/Assessment Note    Patient Details  Name: Tanya Cole MRN: [id number] Date of Birth: October 22, 1933  [facility]) CM/SW Contact:    Liliana Cline, LCSW Phone Number: 05/08/2022, 10:10 AM  Clinical Narrative:                 Patient not fully oriented per chart review. Spoke to daughter Rinaldo Cloud via phone. Patient lives alone and her children provide transportation. PCP is Dr. Lorin Picket. Pharmacy is [facility] [address] [facility] [address]. Patient has a walker, life alert button, raised toilet seat, and shower chair at home. Patient had [facility] recently. No SNF history. Family is agreeable to SNF recommendation. They are hoping to find a SNF for short term rehab and then transition patient to LTC once she finishes her rehab. Patient does have Medicaid. Rinaldo Cloud asked about if this would be an option at [facility]. CSW started SNF work up. Reached out to [place] at [facility]. Awaiting response from [facility]/bed offers.   Expected Discharge Plan: Skilled Nursing Facility Barriers to Discharge: Continued Medical Work up   Patient Goals and CMS Choice Patient states their goals for this hospitalization and ongoing recovery are:: SNF CMS Medicare.gov Compare Post Acute Care list provided to:: Patient Represenative (must comment) Choice offered to / list presented to : Adult Children  Expected Discharge Plan and Services Expected Discharge Plan: Skilled Nursing Facility       Living arrangements for the past 2 months: Single Family Home                                      Prior Living Arrangements/Services Living arrangements for the past 2 months: Single Family Home Lives with:: Self Patient language and need for interpreter reviewed:: Yes Do you feel safe going back to the place where you live?: Yes      Need for Family Participation in Patient Care: Yes (Comment) Care giver support system in place?: Yes (comment) Current home  services: DME Criminal Activity/Legal Involvement Pertinent to Current Situation/Hospitalization: No - Comment as needed  Activities of Daily Living Home Assistive Devices/Equipment: Walker (specify type), Cane (specify quad or straight) ADL Screening (condition at time of admission) Patient's cognitive ability adequate to safely complete daily activities?: No Is the patient deaf or have difficulty hearing?: No Does the patient have difficulty seeing, even when wearing glasses/contacts?: No Does the patient have difficulty concentrating, remembering, or making decisions?: Yes Patient able to express need for assistance with ADLs?: Yes Does the patient have difficulty dressing or bathing?: Yes Independently performs ADLs?: No Communication: Independent Dressing (OT): Needs assistance Is this a change from baseline?: Pre-admission baseline Grooming: Needs assistance Is this a change from baseline?: Pre-admission baseline Feeding: Independent Bathing: Needs assistance Is this a change from baseline?: Pre-admission baseline Toileting: Needs assistance Is this a change from baseline?: Pre-admission baseline In/Out Bed: Needs assistance Is this a change from baseline?: Pre-admission baseline Walks in Home: Needs assistance Is this a change from baseline?: Pre-admission baseline Does the patient have difficulty walking or climbing stairs?: No Weakness of Legs: None Weakness of Arms/Hands: None  Permission Sought/Granted Permission sought to share information with : Facility Industrial/product designer granted to share information with : Yes, Verbal Permission Granted (by daughter)     Permission granted to share info w AGENCY: SNFs        Emotional Assessment  Orientation: : Fluctuating Orientation (Suspected and/or reported Sundowners) Alcohol / Substance Use: Not Applicable Psych Involvement: No (comment)  Admission diagnosis:  Hallucinations [R44.3] Acute  delirium [R41.0] Patient Active Problem List   Diagnosis Date Noted   Hallucinations 05/06/2022   Steroid-induced psychosis, with hallucinations (HCC) 04/03/2022   Acute metabolic encephalopathy 04/02/2022   Paroxysmal atrial fibrillation with RVR (HCC) 04/02/2022   CAD (coronary artery disease) 01/25/2022   Polyarthritis 10/31/2021   COPD (chronic obstructive pulmonary disease) (HCC) 05/21/2021   Hypomagnesemia 05/14/2021   Hypokalemia    Acute exacerbation of chronic obstructive pulmonary disease (COPD) (HCC) 03/01/2021   Chronic diastolic CHF (congestive heart failure) (HCC) 11/29/2020   CAP (community acquired pneumonia) 11/29/2020   Abnormal CXR 10/19/2020   Type 2 diabetes mellitus with cardiac complication (HCC) 10/08/2020   COPD with acute exacerbation (HCC) 08/19/2020   Leukocytosis 08/07/2020   Iron deficiency 04/16/2020   Anemia 04/04/2020   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) 12/29/2018   Memory change 05/27/2018   Osteoporosis 02/05/2018   Polymyalgia rheumatica syndrome (HCC) 05/16/2016   Diverticulitis 02/24/2013   Reactive airway disease 09/15/2012   Hypertension 09/14/2012   Hypercholesterolemia 09/14/2012   Diabetes mellitus (HCC) 09/14/2012   PCP:  Dale Lakeway, MD Pharmacy:   [facility] [address]), [place] - [address] [place]) [place] [address] Phone: [phone] Fax: [phone]  [facility] [address] [place] [address] Phone: [phone] Fax: [phone]     Social Determinants of Health (SDOH) Interventions    Readmission Risk Interventions    05/08/2022   10:08 AM 05/14/2021    9:26 AM  Readmission Risk Prevention Plan  Transportation Screening Complete Complete  PCP or Specialist Appt within 5-7 Days  Complete  Home Care Screening  Complete  Medication Review (RN CM)  Referral to Pharmacy  Medication  Review (RN Care Manager) Complete   PCP or Specialist appointment within 3-5 days of discharge Complete   HRI or Home Care Consult Complete   SW Recovery Care/Counseling Consult Complete   Palliative Care Screening Not Applicable   Skilled Nursing Facility Complete

## 2022-05-08 NOTE — [place] FL2 (Signed)
[place] MEDICAID FL2 LEVEL OF CARE SCREENING TOOL     IDENTIFICATION  Patient Name: [name] Birthdate: 29-Oct-1933 Sex: female Admission Date (Current Location): 05/06/2022  [facility] and [name] Number:  Chiropodist and Address:  [facility], [address] [place], [place] [address]      Provider Number: [id number]  Attending Physician Name and Address:  Lynn Ito, MD  Relative Name and Phone Number:  Greer Pickerel (Daughter)   [phone] [facility])    Current Level of Care: Hospital Recommended Level of Care: Skilled Nursing Facility Prior Approval Number:    Date Approved/Denied:   PASRR Number: [id number]  Discharge Plan:      Current Diagnoses: Patient Active Problem List   Diagnosis Date Noted   Hallucinations 05/06/2022   Steroid-induced psychosis, with hallucinations (HCC) 04/03/2022   Acute metabolic encephalopathy 04/02/2022   Paroxysmal atrial fibrillation with RVR (HCC) 04/02/2022   CAD (coronary artery disease) 01/25/2022   Polyarthritis 10/31/2021   COPD (chronic obstructive pulmonary disease) (HCC) 05/21/2021   Hypomagnesemia 05/14/2021   Hypokalemia    Acute exacerbation of chronic obstructive pulmonary disease (COPD) (HCC) 03/01/2021   Chronic diastolic CHF (congestive heart failure) (HCC) 11/29/2020   CAP (community acquired pneumonia) 11/29/2020   Abnormal CXR 10/19/2020   Type 2 diabetes mellitus with cardiac complication (HCC) 10/08/2020   COPD with acute exacerbation (HCC) 08/19/2020   Leukocytosis 08/07/2020   Iron deficiency 04/16/2020   Anemia 04/04/2020   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) 12/29/2018   Memory change 05/27/2018   Osteoporosis 02/05/2018   Polymyalgia rheumatica syndrome (HCC) 05/16/2016   Diverticulitis 02/24/2013   Reactive airway disease 09/15/2012   Hypertension 09/14/2012   Hypercholesterolemia 09/14/2012   Diabetes  mellitus (HCC) 09/14/2012    Orientation RESPIRATION BLADDER Height & Weight     Self  Normal External catheter Weight: 126 lb 8.7 oz (57.4 kg) Height:  5\' 1"  (154.9 cm)  BEHAVIORAL SYMPTOMS/MOOD NEUROLOGICAL BOWEL NUTRITION STATUS      Continent Diet (carb modified)  AMBULATORY STATUS COMMUNICATION OF NEEDS Skin   Limited Assist Verbally Normal                       Personal Care Assistance Level of Assistance  Bathing, Feeding, Dressing Bathing Assistance: Limited assistance Feeding assistance: Limited assistance Dressing Assistance: Limited assistance     Functional Limitations Info             SPECIAL CARE FACTORS FREQUENCY  PT (By licensed PT), OT (By licensed OT)     PT Frequency: 5 times per week OT Frequency: 5 times per week            Contractures      Additional Factors Info  Code Status, Allergies Code Status Info: DNR Allergies Info: Tramadol           Current Medications (05/08/2022):  This is the current hospital active medication list Current Facility-Administered Medications  Medication Dose Route Frequency Provider Last Rate Last Admin   0.9 %  sodium chloride infusion  250 mL Intravenous PRN Agbata, Tochukwu, MD       acetaminophen (TYLENOL) tablet 500-1,000 mg  500-1,000 mg Oral Q6H PRN Agbata, Tochukwu, MD   500 mg at 05/07/22 2040   amiodarone (PACERONE) tablet 400 mg  400 mg Oral BID 2041, NP   400 mg at 05/08/22 07/09/22   aspirin chewable tablet 81 mg  81 mg Oral Daily Agbata, Tochukwu, MD   81 mg at 05/08/22 0924   atorvastatin (LIPITOR) tablet 80 mg  80 mg Oral Daily Agbata, Tochukwu, MD   80 mg at 05/07/22 2042   diltiazem (CARDIZEM) 125 mg in dextrose 5% 125 mL (1 mg/mL) infusion  5-15 mg/hr Intravenous Continuous Lindajo Royal V, MD 5 mL/hr at 05/07/22 2040 5 mg/hr at 05/07/22 2040   enoxaparin (LOVENOX) injection 40 mg  40 mg Subcutaneous Q24H Agbata, Tochukwu, MD   40 mg at 05/07/22 1441   folic acid  (FOLVITE) tablet 1 mg  1 mg Oral Daily Agbata, Tochukwu, MD   1 mg at 05/08/22 0924   hydrALAZINE (APRESOLINE) injection 10 mg  10 mg Intravenous Q4H PRN Andris Baumann, MD       insulin aspart (novoLOG) injection 0-15 Units  0-15 Units Subcutaneous TID WC Andris Baumann, MD   5 Units at 05/08/22 0850   insulin aspart (novoLOG) injection 0-5 Units  0-5 Units Subcutaneous QHS Andris Baumann, MD       ipratropium-albuterol (DUONEB) 0.5-2.5 (3) MG/3ML nebulizer solution 3 mL  3 mL Nebulization BID PRN Agbata, Tochukwu, MD       LORazepam (ATIVAN) tablet 0.5 mg  0.5 mg Oral BID PRN Charm Rings, NP   0.5 mg at 05/08/22 0648   metoprolol tartrate (LOPRESSOR) tablet 25 mg  25 mg Oral Q6H Lynn Ito, MD   25 mg at 05/08/22 0924   mometasone-formoterol (DULERA) 200-5 MCG/ACT inhaler 2 puff  2 puff Inhalation BID Agbata, Tochukwu, MD   2 puff at 05/08/22 0851   multivitamin-lutein (OCUVITE-LUTEIN) capsule 1 capsule  1 capsule Oral BID Agbata, Tochukwu, MD   1 capsule at 05/07/22 2042   nitroGLYCERIN (NITROSTAT) SL tablet 0.4 mg  0.4 mg Sublingual Q5 min PRN Agbata, Tochukwu, MD       ondansetron (ZOFRAN) tablet 4 mg  4 mg Oral Q6H PRN Agbata, Tochukwu, MD       Or   ondansetron (ZOFRAN) injection 4 mg  4 mg Intravenous Q6H PRN Agbata, Tochukwu, MD       sodium chloride flush (NS) 0.9 % injection 3 mL  3 mL Intravenous Q12H Agbata, Tochukwu, MD   3 mL at 05/07/22 0955   sodium chloride flush (NS) 0.9 % injection 3 mL  3 mL Intravenous Q12H Agbata, Tochukwu, MD   3 mL at 05/08/22 0926   sodium chloride flush (NS) 0.9 % injection 3 mL  3 mL Intravenous PRN Agbata, Tochukwu, MD       Vitamin D (Ergocalciferol) (DRISDOL) capsule 50,000 Units  50,000 Units Oral Q Fri Lucile Shutters, MD         Discharge Medications: Please see discharge summary for a list of discharge medications.  Relevant Imaging Results:  Relevant Lab Results:   Additional Information SS #: [id number]. Seeking STR then  transition to LTC.  Yaniris Braddock E Prentice Sackrider, LCSW

## 2022-05-08 NOTE — Progress Notes (Signed)
Made Dr. Marylu Lund aware patient requesting drop for dry eyes. Per md okay to order artifical tears prn

## 2022-05-08 NOTE — Progress Notes (Signed)
Progress Note  Patient Name: Tanya Cole Date of Encounter: 05/08/2022  [facility] Cardiologist: Lorine Bears, MD   Subjective   Pt sedated   Becomes mildly agitated with touching   Inpatient Medications    Scheduled Meds:  amiodarone  400 mg Oral BID   aspirin  81 mg Oral Daily   atorvastatin  80 mg Oral Daily   enoxaparin (LOVENOX) injection  40 mg Subcutaneous Q24H   folic acid  1 mg Oral Daily   insulin aspart  0-15 Units Subcutaneous TID WC   insulin aspart  0-5 Units Subcutaneous QHS   metoprolol tartrate  25 mg Oral Q6H   mometasone-formoterol  2 puff Inhalation BID   multivitamin-lutein  1 capsule Oral BID   sodium chloride flush  3 mL Intravenous Q12H   sodium chloride flush  3 mL Intravenous Q12H   Vitamin D (Ergocalciferol)  50,000 Units Oral Q Fri   Continuous Infusions:  sodium chloride     diltiazem (CARDIZEM) infusion 5 mg/hr (05/07/22 2040)   PRN Meds: sodium chloride, acetaminophen, hydrALAZINE, ipratropium-albuterol, LORazepam, nitroGLYCERIN, ondansetron **OR** ondansetron (ZOFRAN) IV, sodium chloride flush   Vital Signs    Vitals:   05/08/22 0345 05/08/22 0400 05/08/22 0415 05/08/22 0700  BP:   (!) 142/51 129/67  Pulse:   83 84  Resp: 20 20 (!) 32 (!) 23  Temp:      TempSrc:      SpO2:    94%  Weight:      Height:        Intake/Output Summary (Last 24 hours) at 05/08/2022 0840 Last data filed at 05/07/2022 2338 Gross per 24 hour  Intake 176.14 ml  Output 700 ml  Net -523.86 ml      05/06/2022    9:18 AM 04/15/2022    2:31 PM 04/06/2022    5:00 AM  Last 3 Weights  Weight (lbs) 126 lb 8.7 oz 126 lb 9.6 oz 127 lb 6.8 oz  Weight (kg) 57.4 kg 57.425 kg 57.8 kg      Telemetry    SR and mutifocal atrial rhythm vs afib   Rates 80s   - Personally Reviewed  ECG    No new  - Personally Reviewed  Physical Exam   GEN: No acute distress when not touching  Neck: No JVD Cardiac: RRR, no murmur Respiratory: RElatively clear  GI:   Deferred  MS: No edema;  Neuro:  Deferred   Sedated   Labs    High Sensitivity Troponin:  No results for input(s): "TROPONINIHS" in the last 720 hours.   Chemistry Recent Labs  Lab 05/06/22 0921 05/07/22 0441  NA 141 141  K 3.9 3.2*  CL 108 108  CO2 22 21*  GLUCOSE 163* 185*  BUN 19 14  CREATININE 0.69 0.65  CALCIUM 8.9 8.9  PROT 7.0  --   ALBUMIN 4.2  --   AST 21  --   ALT 16  --   ALKPHOS 52  --   BILITOT 1.1  --   GFRNONAA >60 >60  ANIONGAP 11 12    Lipids No results for input(s): "CHOL", "TRIG", "HDL", "LABVLDL", "LDLCALC", "CHOLHDL" in the last 168 hours.  Hematology Recent Labs  Lab 05/06/22 0921 05/07/22 0441  WBC 12.0* 11.8*  RBC 4.31 4.74  HGB 12.4 13.7  HCT 38.4 41.9  MCV 89.1 88.4  MCH 28.8 28.9  MCHC 32.3 32.7  RDW 13.6 13.5  PLT 250 247   Thyroid  Recent Labs  Lab 05/07/22 0441  TSH 2.064    BNPNo results for input(s): "BNP", "PROBNP" in the last 168 hours.  DDimer No results for input(s): "DDIMER" in the last 168 hours.   Radiology    DG Chest Portable 1 View  Result Date: 05/06/2022 CLINICAL DATA:  Altered mental status EXAM: PORTABLE CHEST 1 VIEW COMPARISON:  04/15/2022 chest radiograph. FINDINGS: Stable cardiomediastinal silhouette with normal heart size. No pneumothorax. No pleural effusion. No pulmonary edema. Stable calcified left lung base granuloma. Stable streaky lingular scarring. No acute consolidative airspace disease. IMPRESSION: 1. No active cardiopulmonary disease. 2. Stable streaky lingular scarring. Electronically Signed   By: Delbert Phenix M.D.   On: 05/06/2022 09:57    Cardiac Studies     Patient Profile     Tanya Cole is a 88 y.o. female with a history of CAD, HFpEF, diabetes, hypertension, hyperlipidemia, palpitations, paroxysmal atrial tachycardia, polymyalgia rheumatica, and asthma/COPD, who is being seen today for the evaluation of atrial fibrillation with rapid ventricular response at the request of Dr.  Marylu Lund. Assessment & Plan    1  PAF  Pti's heart rhythm is much better   No Afib with RVR   In SR with prbably mutifocal atrial rhythm vs afib with rtes controlled     Keep on amiodarone 400 bid and metoprolol 25 q 6 hours for now  Not a candidate for anticoagulation    2  HFpEF  Volume status is OK   3  HTN  BP is fair    4  HL  On lipitor     For questions or updates, please contact [facility] Please consult [email or web address] for contact info under        Signed, Dietrich Pates, MD  05/08/2022, 8:40 AM

## 2022-05-08 NOTE — Plan of Care (Signed)

## 2022-05-08 NOTE — Evaluation (Signed)
Physical Therapy Evaluation Patient Details Name: Tanya Cole MRN: [id number] DOB: [birth year] Today's Date: 05/08/2022  History of Present Illness  Patient is an 88 yo female that presented to the ED for AMS and hallucinations, afib with RVR, workup for metabolic encephalopathy. PMH of DM, HF, HTN, MI, polymyalgia rheumatica, COPD.   Clinical Impression  Patient alert, some garbled speech noted but with time did provide her name and birthday, disoriented x3. She was able to follow one step commands consistently and move all extremities against gravity. Per chart and previous hospital admissions pt lives alone and is ambulatory with a walker.   The patient was able to perform supine to sit with minA, reaches for PT assistance and does require it for trunk elevation and weight shifting. With time and education she progressed from modA in sitting to CGA with fair balance. Sit <> Stand with RW and minA, pt needed cueing for hand placement. modA to step towards HOB (~87ft), reliant on PT to move RW and pt exhibited some scissoring in gait, mild improvement with cueing. Pt literally falling asleep once returned to EOB, laid perpendicular in bed, minA and then maxAx2 to reposition in bed, pt sleeping soundly at PT exit.  Overall the patient demonstrated deficits (see "PT Problem List") that impede the patient's functional abilities, safety, and mobility and would benefit from skilled PT intervention. Recommendation at this time is SNF due to current level of assistance needed pending progress in mobility and cognitive status.        Recommendations for follow up therapy are one component of a multi-disciplinary discharge planning process, led by the attending physician.  Recommendations may be updated based on patient status, additional functional criteria and insurance authorization.  Follow Up Recommendations Skilled nursing-short term rehab (<3 hours/day) Can patient physically be transported by  private vehicle: No    Assistance Recommended at Discharge Frequent or constant Supervision/Assistance  Patient can return home with the following  A lot of help with walking and/or transfers;A lot of help with bathing/dressing/bathroom;Direct supervision/assist for financial management;Assistance with cooking/housework;Help with stairs or ramp for entrance;Assist for transportation;Direct supervision/assist for medications management    Equipment Recommendations Other (comment) (TBD)  Recommendations for Other Services       Functional Status Assessment Patient has had a recent decline in their functional status and demonstrates the ability to make significant improvements in function in a reasonable and predictable amount of time.     Precautions / Restrictions Precautions Precautions: Fall Restrictions Weight Bearing Restrictions: No      Mobility  Bed Mobility Overal bed mobility: Needs Assistance Bed Mobility: Supine to Sit, Sit to Supine     Supine to sit: Min assist, HOB elevated Sit to supine: Min assist   General bed mobility comments: pt reaches for assistance to transition to EOB. minA to straighten out in bed and assist with BLE    Transfers Overall transfer level: Needs assistance Equipment used: Rolling walker (2 wheels) Transfers: Sit to/from Stand Sit to Stand: Min assist           General transfer comment: pt pulls on RW, PT stabilizes RW and assist for lift    Ambulation/Gait Ambulation/Gait assistance: Mod assist, Min assist Gait Distance (Feet): 2 Feet Assistive device: Rolling walker (2 wheels)         General Gait Details: min-modA, pt able to take a few steps towards [facility] but exhibits crossing over of RLE, difficulty motor planning, reliant on PT to move RW  Stairs            Wheelchair Mobility    Modified Rankin (Stroke Patients Only)       Balance Overall balance assessment: Needs assistance Sitting-balance support:  Feet supported, Bilateral upper extremity supported Sitting balance-Leahy Scale: Fair     Standing balance support: Reliant on assistive device for balance Standing balance-Leahy Scale: Poor                               Pertinent Vitals/Pain Pain Assessment Pain Assessment: Faces Faces Pain Scale: No hurt    Home Living Family/patient expects to be discharged to:: Private residence Living Arrangements: Alone Available Help at Discharge: Available PRN/intermittently;Family Type of Home: House Home Access: Stairs to enter Entrance Stairs-Rails: None Entrance Stairs-Number of Steps: 2   Home Layout: One level Home Equipment: Standard Walker;BSC/3in1;Cane - single point;Grab bars - tub/shower Additional Comments: home set up taken from prior admission about 1 month ago, pt unable to provide PLOF    Prior Function Prior Level of Function : Patient poor historian/Family not available                     Hand Dominance        Extremity/Trunk Assessment   Upper Extremity Assessment Upper Extremity Assessment: Generalized weakness;Difficult to assess due to impaired cognition    Lower Extremity Assessment Lower Extremity Assessment: Generalized weakness;Difficult to assess due to impaired cognition       Communication      Cognition Arousal/Alertness: Awake/alert, Lethargic Behavior During Therapy: WFL for tasks assessed/performed Overall Cognitive Status: No family/caregiver present to determine baseline cognitive functioning                                 General Comments: pt oriented to self only        General Comments      Exercises     Assessment/Plan    PT Assessment Patient needs continued PT services  PT Problem List Decreased strength;Decreased mobility;Decreased safety awareness;Decreased activity tolerance;Decreased balance;Decreased knowledge of use of DME       PT Treatment Interventions DME  instruction;Therapeutic exercise;Gait training;Balance training;Neuromuscular re-education;Stair training;Functional mobility training;Therapeutic activities;Patient/family education    PT Goals (Current goals can be found in the Care Plan section)  Acute Rehab PT Goals PT Goal Formulation: Patient unable to participate in goal setting Time For Goal Achievement: 05/22/22 Potential to Achieve Goals: Fair    Frequency Min 2X/week     Co-evaluation               AM-PAC PT "6 Clicks" Mobility  Outcome Measure Help needed turning from your back to your side while in a flat bed without using bedrails?: A Little Help needed moving from lying on your back to sitting on the side of a flat bed without using bedrails?: A Little Help needed moving to and from a bed to a chair (including a wheelchair)?: A Little Help needed standing up from a chair using your arms (e.g., wheelchair or bedside chair)?: A Little Help needed to walk in hospital room?: A Lot Help needed climbing 3-5 steps with a railing? : Total 6 Click Score: 15    End of Session Equipment Utilized During Treatment: Gait belt Activity Tolerance: Patient tolerated treatment well;Patient limited by lethargy Patient left: in bed;with call bell/phone within reach;with bed alarm  set Nurse Communication: Mobility status PT Visit Diagnosis: Other abnormalities of gait and mobility (R26.89);Difficulty in walking, not elsewhere classified (R26.2);Muscle weakness (generalized) (M62.81)    Time: 0812-0825 PT Time Calculation (min) (ACUTE ONLY): 13 min   Charges:   PT Evaluation $PT Eval Moderate Complexity: 1 Mod PT Treatments $Therapeutic Activity: 8-22 mins       Lieutenant Diego PT, DPT 9:18 AM,05/08/22

## 2022-05-08 NOTE — Progress Notes (Signed)
PROGRESS NOTE    Tanya Cole  YQM:[id number] DOB: May 08, 1933 DOA: 05/06/2022 PCP: Dale [place], MD    Brief Narrative:  Tanya Cole is a 88 y.o. female with medical history significant for diabetes mellitus, chronic diastolic dysfunction CHF, coronary artery disease, hypertension, polymyalgia rheumatica, rheumatoid arthritis who was brought into the ER by EMS for evaluation of visual and auditory hallucinations. Most of the history was obtained from patient's daughter who states that over the last 1 week patient has had hallucinations which have progressively worsened.  She was admitted about a month ago for same but at that time had pneumonia.  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  Patient's prednisone has since been discontinued by rheumatology.   Patient states that she lives alone and notes that she has become increasingly forgetful.   7/8  still confused. In afib rvr, cardiology consulted. Overnight on cardizem gtt for afib rvr 7/9 no overnight issues. In sr, hr better  Consultants:  Psych  Procedures:   Antimicrobials:      Subjective:  Confused. Sitting in chair  Objective: Vitals:   05/08/22 0345 05/08/22 0400 05/08/22 0415 05/08/22 0700  BP:   (!) 142/51 129/67  Pulse:   83 84  Resp: 20 20 (!) 32 (!) 23  Temp:      TempSrc:      SpO2:    94%  Weight:      Height:        Intake/Output Summary (Last 24 hours) at 05/08/2022 0839 Last data filed at 05/07/2022 2338 Gross per 24 hour  Intake 176.14 ml  Output 700 ml  Net -523.86 ml   Filed Weights   05/06/22 0918  Weight: 57.4 kg    Examination: Calm, NAD Cta no w/r Reg s1/s2 no gallop Soft benign +bs No edema Awake and alert, confused. Grossly intact Mood and affect appropriate in current setting     Data Reviewed: I have personally reviewed following labs and imaging studies  CBC: Recent Labs  Lab 05/06/22 0921 05/07/22 0441  WBC 12.0* 11.8*  HGB 12.4 13.7   HCT 38.4 41.9  MCV 89.1 88.4  PLT 250 247   Basic Metabolic Panel: Recent Labs  Lab 05/06/22 0921 05/07/22 0441  NA 141 141  K 3.9 3.2*  CL 108 108  CO2 22 21*  GLUCOSE 163* 185*  BUN 19 14  CREATININE 0.69 0.65  CALCIUM 8.9 8.9   GFR: Estimated Creatinine Clearance: 41.1 mL/min (by C-G formula based on SCr of 0.65 mg/dL). Liver Function Tests: Recent Labs  Lab 05/06/22 0921  AST 21  ALT 16  ALKPHOS 52  BILITOT 1.1  PROT 7.0  ALBUMIN 4.2   No results for input(s): "LIPASE", "AMYLASE" in the last 168 hours. No results for input(s): "AMMONIA" in the last 168 hours. Coagulation Profile: No results for input(s): "INR", "PROTIME" in the last 168 hours. Cardiac Enzymes: No results for input(s): "CKTOTAL", "CKMB", "CKMBINDEX", "TROPONINI" in the last 168 hours. BNP (last 3 results) No results for input(s): "PROBNP" in the last 8760 hours. HbA1C: No results for input(s): "HGBA1C" in the last 72 hours. CBG: Recent Labs  Lab 05/07/22 1138 05/07/22 1550 05/07/22 2143 05/08/22 0119 05/08/22 0725  GLUCAP 224* 149* 279* 197* 231*   Lipid Profile: No results for input(s): "CHOL", "HDL", "LDLCALC", "TRIG", "CHOLHDL", "LDLDIRECT" in the last 72 hours. Thyroid Function Tests: Recent Labs    05/07/22 0441  TSH 2.064   Anemia Panel: Recent Labs  05/07/22 0441  VITAMINB12 620  FOLATE >40.0   Sepsis Labs: No results for input(s): "PROCALCITON", "LATICACIDVEN" in the last 168 hours.  Recent Results (from the past 240 hour(s))  Resp Panel by RT-PCR (Flu A&B, Covid) Anterior Nasal Swab     Status: None   Collection Time: 05/06/22  9:21 AM   Specimen: Anterior Nasal Swab  Result Value Ref Range Status   SARS Coronavirus 2 by RT PCR NEGATIVE NEGATIVE Final    Comment: (NOTE) SARS-CoV-2 target nucleic acids are NOT DETECTED.  The SARS-CoV-2 RNA is generally detectable in upper respiratory specimens during the acute phase of infection. The lowest concentration  of SARS-CoV-2 viral copies this assay can detect is 138 copies/mL. A negative result does not preclude SARS-Cov-2 infection and should not be used as the sole basis for treatment or other patient management decisions. A negative result may occur with  improper specimen collection/handling, submission of specimen other than nasopharyngeal swab, presence of viral mutation(s) within the areas targeted by this assay, and inadequate number of viral copies(<138 copies/mL). A negative result must be combined with clinical observations, patient history, and epidemiological information. The expected result is Negative.  Fact Sheet for Patients:  BloggerCourse.com  Fact Sheet for Healthcare Providers:  SeriousBroker.it  This test is no t yet approved or cleared by the [place] FDA and  has been authorized for detection and/or diagnosis of SARS-CoV-2 by FDA under an Emergency Use Authorization (EUA). This EUA will remain  in effect (meaning this test can be used) for the duration of the COVID-19 declaration under Section 564(b)(1) of the Act, 21 U.S.C.section 360bbb-3(b)(1), unless the authorization is terminated  or revoked sooner.       Influenza A by PCR NEGATIVE NEGATIVE Final   Influenza B by PCR NEGATIVE NEGATIVE Final    Comment: (NOTE) The Xpert Xpress SARS-CoV-2/FLU/RSV plus assay is intended as an aid in the diagnosis of influenza from Nasopharyngeal swab specimens and should not be used as a sole basis for treatment. Nasal washings and aspirates are unacceptable for Xpert Xpress SARS-CoV-2/FLU/RSV testing.  Fact Sheet for Patients: BloggerCourse.com  Fact Sheet for Healthcare Providers: SeriousBroker.it  This test is not yet approved or cleared by the [place] FDA and has been authorized for detection and/or diagnosis of SARS-CoV-2 by FDA under an Emergency Use  Authorization (EUA). This EUA will remain in effect (meaning this test can be used) for the duration of the COVID-19 declaration under Section 564(b)(1) of the Act, 21 U.S.C. section 360bbb-3(b)(1), unless the authorization is terminated or revoked.  Performed at [facility], [address] [place], [place] [address]          Radiology Studies: DG Chest Portable 1 View  Result Date: 05/06/2022 CLINICAL DATA:  Altered mental status EXAM: PORTABLE CHEST 1 VIEW COMPARISON:  04/15/2022 chest radiograph. FINDINGS: Stable cardiomediastinal silhouette with normal heart size. No pneumothorax. No pleural effusion. No pulmonary edema. Stable calcified left lung base granuloma. Stable streaky lingular scarring. No acute consolidative airspace disease. IMPRESSION: 1. No active cardiopulmonary disease. 2. Stable streaky lingular scarring. Electronically Signed   By: Delbert Phenix M.D.   On: 05/06/2022 09:57        Scheduled Meds:  amiodarone  400 mg Oral BID   aspirin  81 mg Oral Daily   atorvastatin  80 mg Oral Daily   enoxaparin (LOVENOX) injection  40 mg Subcutaneous Q24H   folic acid  1 mg Oral Daily   insulin aspart  0-15 Units Subcutaneous TID WC   insulin aspart  0-5 Units Subcutaneous QHS   metoprolol tartrate  25 mg Oral Q6H   mometasone-formoterol  2 puff Inhalation BID   multivitamin-lutein  1 capsule Oral BID   sodium chloride flush  3 mL Intravenous Q12H   sodium chloride flush  3 mL Intravenous Q12H   Vitamin D (Ergocalciferol)  50,000 Units Oral Q Fri   Continuous Infusions:  sodium chloride     diltiazem (CARDIZEM) infusion 5 mg/hr (05/07/22 2040)    Assessment & Plan:   Principal Problem:   Hallucinations Active Problems:   Polymyalgia rheumatica syndrome (HCC)   Hypertension   Diabetes mellitus (HCC)   CAD (coronary artery disease)   Paroxysmal atrial fibrillation with RVR (HCC)   Hallucinations Patient presents to the ER via EMS for evaluation  of visual and auditory hallucinations She was recently hospitalized for same and at that time her symptoms were attributed to steroid-induced psychosis because she had been on prednisone for her arthritis. Patient has been off prednisone for over a week and now has symptoms. Her labs are within normal limits and vital signs are stable Concern for possible dementia 7/8 psych input was appreciated. Dc seroquel. Started on ativan 0.5mg  bid prn.  7/9 confused , still hallucinating. Continue current regimen PT OT recommends SNF   Polymyalgia rheumatica syndrome (HCC) Stable and not acutely exacerbated 7/9 continue methotrexate weekly and folic acid     PAF/PAT Now with rvr On iv cardizem HR not controlled, started on beta blk.  Now AT Cards consulted, input was appreciated- no good a/c currently concerns of her medical issues currently and will defer for now. Chadsvac 7. 7/9 cardiology following Currently in sinus rhythm with PACs Continue amiodarone and metoprolol Not a candidate for anticoagulation at this time     Hypertension Stable continue beta-blockers    Diabetes mellitus ([place]) Hold metformin Maintain consistent carbohydrate diet Glycemic control sliding scale     CAD (coronary artery disease) Stable Continue aspirin, atorvastatin and metoprolol     DVT prophylaxis: lovenox Code Status:DNR Family Communication:  not at bedside Disposition Plan: SNF Status is: Inpatient Patient remains inpatient as patient requires IV treatment.  Hallucinating.  Also needs SNF        LOS: 1 day   Time spent: 35 min    Nolberto Hanlon, MD [facility] Pager [phone]  If 7PM-7AM, please contact night-coverage 05/08/2022, 8:39 AM

## 2022-05-08 NOTE — Progress Notes (Addendum)
Bladder scan showed 148 cc. Encourage patient to void, patient states she does not need at this time. Sitter at bedside, instructed to encourage void and keep an eye out on ext cath. If patient voids we will bladder scan for post void residual. Will continue to monitor. Patient was able to void about 20 minutes after initial bladder scan. Post void residual showed 86cc.

## 2022-05-08 NOTE — Evaluation (Signed)
Occupational Therapy Evaluation Patient Details Name: Tanya Cole MRN: [id number] DOB: 02/27/1933 Today's Date: 05/08/2022   History of Present Illness Patient is an 88 yo female that presented to the ED for AMS and hallucinations, afib with RVR, workup for metabolic encephalopathy. PMH of DM, HF, HTN, MI, polymyalgia rheumatica, COPD.   Clinical Impression   Ms. Haltiwanger presents with generalized weakness, limited endurance, and confusion. Prior to admission, pt has been living alone, walking with a RW, and generally IND in basic ADL. During today's evaluation, pt is inconsistently able to provide details re: PLOF and living situation. She is able to provide correct month and year and is somewhat oriented to situation (e.g., able to report that she had been seeing insects on the walls, but says she is not seeing them now), but is not oriented to place. She denies pain. Pt is unable to move legs to side of bed, requires Mod A for repositioning LE to sit EOB, Min A for coming into standing with RW, with repeated cueing for hand placement on RW, with pt appearing to have difficulty visually locating R hand grip on RW, instead consistently reaching to left of grip, inside the walker. Pt displayed better stability during pivot-transfer than in static standing, was able to transfer to recliner with CGA. At this point pt is far from her baseline level of cognition and fxl mobility. Will continue to provide OT services during hospitalization and would recommend DC to SNF level of care unless pt's condition improves.     Recommendations for follow up therapy are one component of a multi-disciplinary discharge planning process, led by the attending physician.  Recommendations may be updated based on patient status, additional functional criteria and insurance authorization.   Follow Up Recommendations  Skilled nursing-short term rehab (<3 hours/day)    Assistance Recommended at Discharge    Patient can  return home with the following A little help with walking and/or transfers;A little help with bathing/dressing/bathroom;Assistance with cooking/housework;Direct supervision/assist for medications management    Functional Status Assessment  Patient has had a recent decline in their functional status and demonstrates the ability to make significant improvements in function in a reasonable and predictable amount of time.  Equipment Recommendations  None recommended by OT    Recommendations for Other Services       Precautions / Restrictions Precautions Precautions: Fall Restrictions Weight Bearing Restrictions: No      Mobility Bed Mobility Overal bed mobility: Needs Assistance Bed Mobility: Supine to Sit     Supine to sit: Mod assist     General bed mobility comments: Pt reports she cannot move legs towards EOB, requires Mod A for repositioning LE    Transfers Overall transfer level: Needs assistance Equipment used: Rolling walker (2 wheels) Transfers: Sit to/from Stand Sit to Stand: Min assist           General transfer comment: Pt requires Min A for coming up into standing, repeated cueing for hand placement on RW      Balance Overall balance assessment: Needs assistance Sitting-balance support: Feet supported, Bilateral upper extremity supported Sitting balance-Leahy Scale: Fair     Standing balance support: Bilateral upper extremity supported, Reliant on assistive device for balance Standing balance-Leahy Scale: Fair                             ADL either performed or assessed with clinical judgement   ADL Overall ADL's : Needs  assistance/impaired                                     Functional mobility during ADLs: Min guard;Rolling walker (2 wheels)       Vision Baseline Vision/History: 4 Cataracts Patient Visual Report: Peripheral vision impairment Additional Comments: has difficultly seeing, placing hands on RW      Perception     Praxis      Pertinent Vitals/Pain Pain Assessment Pain Assessment: No/denies pain     Hand Dominance Right   Extremity/Trunk Assessment Upper Extremity Assessment Upper Extremity Assessment: Generalized weakness   Lower Extremity Assessment Lower Extremity Assessment: Generalized weakness       Communication Communication Communication: No difficulties   Cognition Arousal/Alertness: Awake/alert, Lethargic Behavior During Therapy: WFL for tasks assessed/performed Overall Cognitive Status: No family/caregiver present to determine baseline cognitive functioning                                 General Comments: Able to provide month and year, birthday, oriented to situation, but confused about place. Tearful throughout session.     General Comments       Exercises Other Exercises Other Exercises: Educ re: falls prevention, safety. Coginitive re-orientation   Shoulder Instructions      Home Living Family/patient expects to be discharged to:: Private residence Living Arrangements: Alone Available Help at Discharge: Available PRN/intermittently;Family Type of Home: House Home Access: Stairs to enter Secretary/administrator of Steps: 2 Entrance Stairs-Rails: None Home Layout: One level     Bathroom Shower/Tub: Tub/shower unit;Sponge bathes at baseline   [employer]: Standard     Home Equipment: Standard Walker;BSC/3in1;Cane - single point;Grab bars - tub/shower   Additional Comments: home set up taken from prior admission about 1 month ago, pt unable to provide PLOF      Prior Functioning/Environment Prior Level of Function : Patient poor historian/Family not available             Mobility Comments: Pt reports being mod I with use of RW for mobility and transfers. She does not drive and family assists her with appointments ADLs Comments: Pt reports being mod I with self care, simple meals, and laundry. Sponge bathes. She  needs some help from family for deep cleaning. Daughters check in daily.        OT Problem List: Decreased activity tolerance;Decreased coordination;Decreased knowledge of use of DME or AE;Impaired vision/perception;Decreased strength;Impaired balance (sitting and/or standing);Decreased cognition      OT Treatment/Interventions: Self-care/ADL training;Therapeutic exercise;Patient/family education;Balance training;Energy conservation;Therapeutic activities;DME and/or AE instruction;Cognitive remediation/compensation    OT Goals(Current goals can be found in the care plan section) Acute Rehab OT Goals Patient Stated Goal: to get better OT Goal Formulation: With patient Time For Goal Achievement: 05/22/22 Potential to Achieve Goals: Good ADL Goals Pt Will Perform Grooming: standing;with supervision;with set-up Pt Will Perform Lower Body Dressing: sitting/lateral leans;with set-up;with supervision Pt Will Transfer to Toilet: with supervision;regular height toilet;stand pivot transfer (using RW)  OT Frequency: Min 2X/week    Co-evaluation              AM-PAC OT "6 Clicks" Daily Activity     Outcome Measure Help from another person eating meals?: None Help from another person taking care of personal grooming?: A Little Help from another person toileting, which includes using toliet, bedpan, or urinal?: A  Lot Help from another person bathing (including washing, rinsing, drying)?: A Lot Help from another person to put on and taking off regular upper body clothing?: A Little Help from another person to put on and taking off regular lower body clothing?: A Little 6 Click Score: 17   End of Session Equipment Utilized During Treatment: Rolling walker (2 wheels) Nurse Communication: Mobility status  Activity Tolerance: Patient tolerated treatment well Patient left: in chair;with call bell/phone within reach;with chair alarm set;with nursing/sitter in room  OT Visit Diagnosis:  Unsteadiness on feet (R26.81);Muscle weakness (generalized) (M62.81);Other symptoms and signs involving cognitive function                Time: 4562-5638 OT Time Calculation (min): 27 min Charges:  OT General Charges $OT Visit: 1 Visit OT Evaluation $OT Eval Low Complexity: 1 Low OT Treatments $Self Care/Home Management : 23-37 mins Latina Craver, PhD, MS, OTR/L 05/08/22, 1:11 PM

## 2022-05-09 ENCOUNTER — Telehealth: Payer: 59

## 2022-05-09 ENCOUNTER — Telehealth: Payer: Self-pay | Admitting: *Deleted

## 2022-05-09 DIAGNOSIS — I480 Paroxysmal atrial fibrillation: Secondary | ICD-10-CM | POA: Diagnosis not present

## 2022-05-09 DIAGNOSIS — R443 Hallucinations, unspecified: Secondary | ICD-10-CM | POA: Diagnosis not present

## 2022-05-09 LAB — GLUCOSE, CAPILLARY
Glucose-Capillary: 218 mg/dL — ABNORMAL HIGH (ref 70–99)
Glucose-Capillary: 148 mg/dL — ABNORMAL HIGH (ref 70–99)
Glucose-Capillary: 139 mg/dL — ABNORMAL HIGH (ref 70–99)
Glucose-Capillary: 171 mg/dL — ABNORMAL HIGH (ref 70–99)

## 2022-05-09 MED ORDER — METOPROLOL TARTRATE 25 MG PO TABS
25.0000 mg | ORAL_TABLET | Freq: Two times a day (BID) | ORAL | Status: DC
  Administered 2022-05-09 – 2022-05-11 (×4): 25 mg via ORAL
  Filled 2022-05-09 (×4): qty 1

## 2022-05-09 NOTE — Progress Notes (Signed)
Progress Note  Patient Name: Tanya Cole Date of Encounter: 05/09/2022  [facility] Cardiologist: Lorine Bears, MD   Subjective   Patient converted to NSR. She feels tired. No chest pain or SOB.   Inpatient Medications    Scheduled Meds:  amiodarone  400 mg Oral BID   aspirin  81 mg Oral Daily   atorvastatin  80 mg Oral Daily   enoxaparin (LOVENOX) injection  40 mg Subcutaneous Q24H   folic acid  1 mg Oral Daily   insulin aspart  0-15 Units Subcutaneous TID WC   insulin aspart  0-5 Units Subcutaneous QHS   metoprolol tartrate  25 mg Oral Q6H   mometasone-formoterol  2 puff Inhalation BID   multivitamin-lutein  1 capsule Oral BID   sodium chloride flush  3 mL Intravenous Q12H   sodium chloride flush  3 mL Intravenous Q12H   Vitamin D (Ergocalciferol)  50,000 Units Oral Q Fri   Continuous Infusions:  sodium chloride     PRN Meds: sodium chloride, acetaminophen, ipratropium-albuterol, LORazepam, nitroGLYCERIN, ondansetron **OR** ondansetron (ZOFRAN) IV, polyvinyl alcohol, sodium chloride flush   Vital Signs    Vitals:   05/08/22 2327 05/09/22 0133 05/09/22 0401 05/09/22 0758  BP: (!) 135/47 (!) 122/50 (!) 122/40 139/61  Pulse: 71 71 63 62  Resp: 19  19 17   Temp: 97.6 F (36.4 C)  97.8 F (36.6 C) 98.1 F (36.7 C)  TempSrc: Oral  Axillary Oral  SpO2: 96%  96% 100%  Weight:      Height:        Intake/Output Summary (Last 24 hours) at 05/09/2022 1118 Last data filed at 05/09/2022 1030 Gross per 24 hour  Intake 518.95 ml  Output 220 ml  Net 298.95 ml      05/06/2022    9:18 AM 04/15/2022    2:31 PM 04/06/2022    5:00 AM  Last 3 Weights  Weight (lbs) 126 lb 8.7 oz 126 lb 9.6 oz 127 lb 6.8 oz  Weight (kg) 57.4 kg 57.425 kg 57.8 kg      Telemetry    Afib>NSR HR 60s - Personally Reviewed  ECG    No new - Personally Reviewed  Physical Exam   GEN: No acute distress.   Neck: No JVD Cardiac: RRR, no murmurs, rubs, or gallops.  Respiratory:  Clear to auscultation bilaterally. GI: Soft, nontender, non-distended  MS: No edema; No deformity. Neuro:  Nonfocal  Psych: Normal affect   Labs    High Sensitivity Troponin:  No results for input(s): "TROPONINIHS" in the last 720 hours.   Chemistry Recent Labs  Lab 05/06/22 0921 05/07/22 0441 05/08/22 0858  NA 141 141 143  K 3.9 3.2* 3.7  CL 108 108 109  CO2 22 21* 26  GLUCOSE 163* 185* 226*  BUN 19 14 16   CREATININE 0.69 0.65 0.63  CALCIUM 8.9 8.9 9.4  PROT 7.0  --   --   ALBUMIN 4.2  --   --   AST 21  --   --   ALT 16  --   --   ALKPHOS 52  --   --   BILITOT 1.1  --   --   GFRNONAA >60 >60 >60  ANIONGAP 11 12 8     Lipids No results for input(s): "CHOL", "TRIG", "HDL", "LABVLDL", "LDLCALC", "CHOLHDL" in the last 168 hours.  Hematology Recent Labs  Lab 05/06/22 0921 05/07/22 0441  WBC 12.0* 11.8*  RBC 4.31 4.74  HGB  12.4 13.7  HCT 38.4 41.9  MCV 89.1 88.4  MCH 28.8 28.9  MCHC 32.3 32.7  RDW 13.6 13.5  PLT 250 247   Thyroid  Recent Labs  Lab 05/07/22 0441  TSH 2.064    BNPNo results for input(s): "BNP", "PROBNP" in the last 168 hours.  DDimer No results for input(s): "DDIMER" in the last 168 hours.   Radiology    No results found.  Cardiac Studies   Echo 10/2021   1. Left ventricular ejection fraction, by estimation, is 60 to 65%. The  left ventricle has normal function. The left ventricle has no regional  wall motion abnormalities. There is mild left ventricular hypertrophy of  the basal-septal segment. Left  ventricular diastolic parameters are indeterminate.   2. Right ventricular systolic function is normal. The right ventricular  size is normal. Mildly increased right ventricular wall thickness. There  is moderately elevated pulmonary artery systolic pressure.   3. Left atrial size was mildly dilated.   4. The mitral valve is degenerative. Mild mitral valve regurgitation.  Mild mitral stenosis.   5. The aortic valve has an indeterminant  number of cusps. There is mild  calcification of the aortic valve. There is mild thickening of the aortic  valve. Aortic valve regurgitation is not visualized. Aortic valve  sclerosis/calcification is present,  without any evidence of aortic stenosis.   6. The inferior vena cava is normal in size with <50% respiratory  variability, suggesting right atrial pressure of 8 mmHg.   LHC 2021 Mid LAD lesion is 60% stenosed. Prox LAD lesion is 30% stenosed. Dist LAD lesion is 90% stenosed. Prox RCA lesion is 30% stenosed. The left ventricular systolic function is normal. LV end diastolic pressure is mildly elevated. The left ventricular ejection fraction is 55-65% by visual estimate.   1.  Significant one-vessel coronary artery disease with patent proximal LAD stent with mild in-stent restenosis.  Stable mid LAD stenosis at 60% and significant distal LAD stenosis 90% close to the apex. 2.  Normal LV systolic function mildly elevated left ventricular end-diastolic pressure.   Recommendations: Continue medical therapy.  Distal LAD stenosis is close to the apex and not amenable to PCI.     Patient Profile     88 y.o. female with a h/o CAD, HFpEF, diabetes, HTN, HLD, palpitations, paroxysmal Atach, polymyalgia rheumatica, asthma/COPD who is being seen for rapid Afib.   Assessment & Plan    New Paroxysmal afib H/o atrial tachycardia - in NSR with rates in the 60s - amiodarone 400mg  BIDx1 week> 200mg  BID x 1 week>200mg  thereafter - metoprolol 25Q6 hours>can consolidate  - CHADSVASC of 7 - Discussed a/c with family>felt to not be a candidate for longterm a/c given medication noncompliance, forgetfulness, overall high risk  HFpEF - euvolemic on exam.  - Echo 10/2021 showed LVEF 60-65%, no WMA, mild LVH, mild MR - continue BB therapy  CAD with prior stenting - Las cath in 2021 showed 1C CAD with patent proximal LAD stent with mild ISR, stable 60% mLAD stenosis and significant 90% distal LAD  stenosis.  - continue Aspirin and statin  HTN - BP is good  HLD - LDL 50 - continue Lipitor   For questions or updates, please contact [facility] Please consult [email or web address] for contact info under        Signed, Sahasra Belue 12/2021, PA-C  05/09/2022, 11:18 AM

## 2022-05-09 NOTE — Progress Notes (Signed)
PROGRESS NOTE    [place]  KZS:[id number] DOB: [birth year] DOA: 05/06/2022 PCP: Dale [place], MD    Brief Narrative:  Tanya Cole is a 88 y.o. female with medical history significant for diabetes mellitus, chronic diastolic dysfunction CHF, coronary artery disease, hypertension, polymyalgia rheumatica, rheumatoid arthritis who was brought into the ER by EMS for evaluation of visual and auditory hallucinations. Most of the history was obtained from patient's daughter who states that over the last 1 week patient has had hallucinations which have progressively worsened.  She was admitted about a month ago for same but at that time had pneumonia.  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  Patient's prednisone has since been discontinued by rheumatology.   Patient states that she lives alone and notes that she has become increasingly forgetful.   7/8  still confused. In afib rvr, cardiology consulted. Overnight on cardizem gtt for afib rvr 7/9 no overnight issues. In sr, hr better 7/10 family at bedside. Pt oriented x3.appears to be at baseline this am. Not confused as the last two days. Sitter at bedside too  Consultants:  Psych  Procedures:   Antimicrobials:      Subjective:  Pt denies sob, cp, or any other complaints  Objective: Vitals:   05/08/22 2327 05/09/22 0133 05/09/22 0401 05/09/22 0758  BP: (!) 135/47 (!) 122/50 (!) 122/40 139/61  Pulse: 71 71 63 62  Resp: 19  19 17   Temp: 97.6 F (36.4 C)  97.8 F (36.6 C) 98.1 F (36.7 C)  TempSrc: Oral  Axillary Oral  SpO2: 96%  96% 100%  Weight:      Height:        Intake/Output Summary (Last 24 hours) at 05/09/2022 0919 Last data filed at 05/09/2022 0810 Gross per 24 hour  Intake 398.95 ml  Output 220 ml  Net 178.95 ml   Filed Weights   05/06/22 0918  Weight: 57.4 kg    Examination: Calm, NAD Cta no w/r Reg s1/s2 no gallop Soft benign +bs No edema Aaoxox3  Mood and affect  appropriate in current setting                Data Reviewed: I have personally reviewed following labs and imaging studies  CBC: Recent Labs  Lab 05/06/22 0921 05/07/22 0441  WBC 12.0* 11.8*  HGB 12.4 13.7  HCT 38.4 41.9  MCV 89.1 88.4  PLT 250 247   Basic Metabolic Panel: Recent Labs  Lab 05/06/22 0921 05/07/22 0441 05/08/22 0858  NA 141 141 143  K 3.9 3.2* 3.7  CL 108 108 109  CO2 22 21* 26  GLUCOSE 163* 185* 226*  BUN 19 14 16   CREATININE 0.69 0.65 0.63  CALCIUM 8.9 8.9 9.4   GFR: Estimated Creatinine Clearance: 41.1 mL/min (by C-G formula based on SCr of 0.63 mg/dL). Liver Function Tests: Recent Labs  Lab 05/06/22 0921  AST 21  ALT 16  ALKPHOS 52  BILITOT 1.1  PROT 7.0  ALBUMIN 4.2   No results for input(s): "LIPASE", "AMYLASE" in the last 168 hours. No results for input(s): "AMMONIA" in the last 168 hours. Coagulation Profile: No results for input(s): "INR", "PROTIME" in the last 168 hours. Cardiac Enzymes: No results for input(s): "CKTOTAL", "CKMB", "CKMBINDEX", "TROPONINI" in the last 168 hours. BNP (last 3 results) No results for input(s): "PROBNP" in the last 8760 hours. HbA1C: No results for input(s): "HGBA1C" in the last 72 hours. CBG: Recent Labs  Lab 05/08/22 0725 05/08/22  1148 05/08/22 1542 05/08/22 2147 05/09/22 0751  GLUCAP 231* 227* 117* 186* 218*   Lipid Profile: No results for input(s): "CHOL", "HDL", "LDLCALC", "TRIG", "CHOLHDL", "LDLDIRECT" in the last 72 hours. Thyroid Function Tests: Recent Labs    05/07/22 0441  TSH 2.064   Anemia Panel: Recent Labs    05/07/22 0441  VITAMINB12 620  FOLATE >40.0   Sepsis Labs: No results for input(s): "PROCALCITON", "LATICACIDVEN" in the last 168 hours.  Recent Results (from the past 240 hour(s))  Resp Panel by RT-PCR (Flu A&B, Covid) Anterior Nasal Swab     Status: None   Collection Time: 05/06/22  9:21 AM   Specimen: Anterior Nasal Swab  Result Value Ref Range Status    SARS Coronavirus 2 by RT PCR NEGATIVE NEGATIVE Final    Comment: (NOTE) SARS-CoV-2 target nucleic acids are NOT DETECTED.  The SARS-CoV-2 RNA is generally detectable in upper respiratory specimens during the acute phase of infection. The lowest concentration of SARS-CoV-2 viral copies this assay can detect is 138 copies/mL. A negative result does not preclude SARS-Cov-2 infection and should not be used as the sole basis for treatment or other patient management decisions. A negative result may occur with  improper specimen collection/handling, submission of specimen other than nasopharyngeal swab, presence of viral mutation(s) within the areas targeted by this assay, and inadequate number of viral copies(<138 copies/mL). A negative result must be combined with clinical observations, patient history, and epidemiological information. The expected result is Negative.  Fact Sheet for Patients:  BloggerCourse.com  Fact Sheet for Healthcare Providers:  SeriousBroker.it  This test is no t yet approved or cleared by the [place] FDA and  has been authorized for detection and/or diagnosis of SARS-CoV-2 by FDA under an Emergency Use Authorization (EUA). This EUA will remain  in effect (meaning this test can be used) for the duration of the COVID-19 declaration under Section 564(b)(1) of the Act, 21 U.S.C.section 360bbb-3(b)(1), unless the authorization is terminated  or revoked sooner.       Influenza A by PCR NEGATIVE NEGATIVE Final   Influenza B by PCR NEGATIVE NEGATIVE Final    Comment: (NOTE) The Xpert Xpress SARS-CoV-2/FLU/RSV plus assay is intended as an aid in the diagnosis of influenza from Nasopharyngeal swab specimens and should not be used as a sole basis for treatment. Nasal washings and aspirates are unacceptable for Xpert Xpress SARS-CoV-2/FLU/RSV testing.  Fact Sheet for  Patients: BloggerCourse.com  Fact Sheet for Healthcare Providers: SeriousBroker.it  This test is not yet approved or cleared by the [place] FDA and has been authorized for detection and/or diagnosis of SARS-CoV-2 by FDA under an Emergency Use Authorization (EUA). This EUA will remain in effect (meaning this test can be used) for the duration of the COVID-19 declaration under Section 564(b)(1) of the Act, 21 U.S.C. section 360bbb-3(b)(1), unless the authorization is terminated or revoked.  Performed at [facility], [address], [place], [place] [address]          Radiology Studies: No results found.      Scheduled Meds:  amiodarone  400 mg Oral BID   aspirin  81 mg Oral Daily   atorvastatin  80 mg Oral Daily   enoxaparin (LOVENOX) injection  40 mg Subcutaneous Q24H   folic acid  1 mg Oral Daily   insulin aspart  0-15 Units Subcutaneous TID WC   insulin aspart  0-5 Units Subcutaneous QHS   metoprolol tartrate  25 mg Oral Q6H  mometasone-formoterol  2 puff Inhalation BID   multivitamin-lutein  1 capsule Oral BID   sodium chloride flush  3 mL Intravenous Q12H   sodium chloride flush  3 mL Intravenous Q12H   Vitamin D (Ergocalciferol)  50,000 Units Oral Q Fri   Continuous Infusions:  sodium chloride      Assessment & Plan:   Principal Problem:   Hallucinations Active Problems:   Polymyalgia rheumatica syndrome (HCC)   Hypertension   Diabetes mellitus (HCC)   CAD (coronary artery disease)   Paroxysmal atrial fibrillation with RVR (HCC)   Hallucinations Patient presents to the ER via EMS for evaluation of visual and auditory hallucinations She was recently hospitalized for same and at that time her symptoms were attributed to steroid-induced psychosis because she had been on prednisone for her arthritis. Patient has been off prednisone for over a week and now has symptoms. Her labs are  within normal limits and vital signs are stable Concern for possible dementia 7/8 psych input was appreciated. Dc seroquel. Started on ativan 0.5mg  bid prn.  7/9 confused , still hallucinating. Continue current regimen PT OT recommends SNF 7/10 appears to be improving. Might have up and down course with dementia.    Polymyalgia rheumatica syndrome (HCC) Stable and not acutely exacerbated 7/10 continue methotrexate weekly and folic acid.     PAF/PAT Now with rvr On iv cardizem HR not controlled, started on beta blk.  Now AT Cards consulted, input was appreciated- no good a/c currently concerns of her medical issues currently and will defer for now. Chadsvac 7. 7/9 cardiology following Currently in sinus rhythm with PACs Continue amiodarone and metoprolol 7/10 not candidate for a/c at this time.      Hypertension Stable. Continue beta blk     Diabetes mellitus (HCC) Hold metformin Maintain consistent carbohydrate diet Glycemic control sliding scale     CAD (coronary artery disease) Stable Continue aspirin, atorvastatin and metoprolol     DVT prophylaxis: lovenox Code Status:DNR Family Communication:  not at bedside Disposition Plan: SNF Status is: Inpatient Patient remains inpatient as patient requires IV treatment.  Hallucinating.  Also needs SNF        LOS: 2 days   Time spent: 35 min    Lynn Ito, MD [facility] Pager [phone]  If 7PM-7AM, please contact night-coverage 05/09/2022, 9:19 AM

## 2022-05-09 NOTE — Progress Notes (Signed)
Physical Therapy Treatment Patient Details Name: Tanya Cole MRN: [id number] DOB: 1933-04-17 Today's Date: 05/09/2022   History of Present Illness Patient is an 88 yo female that presented to the ED for AMS and hallucinations, afib with RVR, workup for metabolic encephalopathy. PMH of DM, HF, HTN, MI, polymyalgia rheumatica, COPD.    PT Comments    Pt bed, ready for session this am.  In and out of bed with min guard.  She is able to stand and transfer to [facility] but only voided a trace before 80' gait with RW and min a x 1 limited by fatigue.  Knee weakness and fatigue while static standing for pericare.  Pt and daughter stated she only used a walker as needed prior to admission.  While she had made general gains from session yesterday she remains not at baseline needing increased assist for ADL's and general mobility.  Daughter feels SNF remains best option for her at discharge to return to PLOF.  Will leave recommendations for SNF as they are supported at this time in regards to mobility and functional independence.   Recommendations for follow up therapy are one component of a multi-disciplinary discharge planning process, led by the attending physician.  Recommendations may be updated based on patient status, additional functional criteria and insurance authorization.  Follow Up Recommendations  Skilled nursing-short term rehab (<3 hours/day) Can patient physically be transported by private vehicle: No   Assistance Recommended at Discharge Frequent or constant Supervision/Assistance  Patient can return home with the following Direct supervision/assist for financial management;Assistance with cooking/housework;Help with stairs or ramp for entrance;Assist for transportation;Direct supervision/assist for medications management;A little help with walking and/or transfers;A little help with bathing/dressing/bathroom   Equipment Recommendations       Recommendations for Other Services        Precautions / Restrictions Precautions Precautions: Fall Restrictions Weight Bearing Restrictions: No     Mobility  Bed Mobility Overal bed mobility: Needs Assistance Bed Mobility: Supine to Sit, Sit to Supine     Supine to sit: Min guard Sit to supine: Min guard        Transfers Overall transfer level: Needs assistance Equipment used: Rolling walker (2 wheels) Transfers: Sit to/from Stand, Bed to chair/wheelchair/BSC Sit to Stand: Min assist   Step pivot transfers: Min guard, Min assist            Ambulation/Gait Ambulation/Gait assistance: Editor, commissioning (Feet): 80 Feet Assistive device: Rolling walker (2 wheels) Gait Pattern/deviations: Step-through pattern, Decreased step length - right, Decreased step length - left, Trunk flexed Gait velocity: decreased     General Gait Details: able to walk to nursing station and back today with much improved gait compared to yesterday but far from baseline   Stairs             Wheelchair Mobility    Modified Rankin (Stroke Patients Only)       Balance Overall balance assessment: Needs assistance Sitting-balance support: Feet supported, Bilateral upper extremity supported Sitting balance-Leahy Scale: Fair     Standing balance support: Bilateral upper extremity supported, Reliant on assistive device for balance Standing balance-Leahy Scale: Fair                              Cognition Arousal/Alertness: Awake/alert Behavior During Therapy: WFL for tasks assessed/performed Overall Cognitive Status: Within Functional Limits for tasks assessed  Exercises Other Exercises Other Exercises: to [facility] to void but little output    General Comments        Pertinent Vitals/Pain Pain Assessment Pain Assessment: No/denies pain    Home Living                          Prior Function            PT Goals  (current goals can now be found in the care plan section) Progress towards PT goals: Progressing toward goals    Frequency    Min 2X/week      PT Plan Current plan remains appropriate    Co-evaluation              AM-PAC PT "6 Clicks" Mobility   Outcome Measure  Help needed turning from your back to your side while in a flat bed without using bedrails?: None Help needed moving from lying on your back to sitting on the side of a flat bed without using bedrails?: A Little Help needed moving to and from a bed to a chair (including a wheelchair)?: A Little Help needed standing up from a chair using your arms (e.g., wheelchair or bedside chair)?: A Little Help needed to walk in hospital room?: A Little Help needed climbing 3-5 steps with a railing? : A Lot 6 Click Score: 18    End of Session Equipment Utilized During Treatment: Gait belt Activity Tolerance: Patient tolerated treatment well Patient left: in bed;with call bell/phone within reach;with family/visitor present;with nursing/sitter in room Nurse Communication: Mobility status PT Visit Diagnosis: Other abnormalities of gait and mobility (R26.89);Difficulty in walking, not elsewhere classified (R26.2);Muscle weakness (generalized) (M62.81)     Time: 9449-6759 PT Time Calculation (min) (ACUTE ONLY): 18 min  Charges:  $Gait Training: 8-22 mins                   Danielle Dess, PTA 05/09/22, 3:23 PM

## 2022-05-09 NOTE — Telephone Encounter (Signed)
  Care Management   Follow Up Note   05/09/2022 Name: Tanya Cole MRN: [id number] DOB: 09/23/1933   Referred by: Dale Bethel, MD Reason for referral : Chronic Care Management (CHF)   Noted patient hospitalized at [facility] with plans for discharge to SNF.  Will monitor chart for discharge plan.  Follow Up Plan: The care management team will reach out to the patient again over the next 10 days. To verify disposition.  Rhae Lerner RN, MSN RN Care Management Coordinator [facility] [phone] [email or web address]

## 2022-05-09 NOTE — Plan of Care (Signed)

## 2022-05-09 NOTE — TOC Progression Note (Signed)
Transition of Care [facility]) - Progression Note    Patient Details  Name: Latarra Eagleton MRN: [id number] Date of Birth: [birth year]  [facility] CM/SW Contact  Caryn Section, RN Phone Number: 05/09/2022, 2:23 PM  Clinical Narrative:  RNCM contacted Verlon Au from [employer], she continues to look into whether patient is able to transfer to facility and will call RNCM back with update.       Expected Discharge Plan: Skilled Nursing Facility Barriers to Discharge: Continued Medical Work up  Expected Discharge Plan and Services Expected Discharge Plan: Skilled Nursing Facility       Living arrangements for the past 2 months: Single Family Home                                       Social Determinants of Health (SDOH) Interventions    Readmission Risk Interventions    05/08/2022   10:08 AM 05/14/2021    9:26 AM  Readmission Risk Prevention Plan  Transportation Screening Complete Complete  PCP or Specialist Appt within 5-7 Days  Complete  Home Care Screening  Complete  Medication Review (RN CM)  Referral to Pharmacy  Medication Review (RN Care Manager) Complete   PCP or Specialist appointment within 3-5 days of discharge Complete   HRI or Home Care Consult Complete   SW Recovery Care/Counseling Consult Complete   Palliative Care Screening Not Applicable   Skilled Nursing Facility Complete

## 2022-05-10 DIAGNOSIS — R443 Hallucinations, unspecified: Secondary | ICD-10-CM | POA: Diagnosis not present

## 2022-05-10 DIAGNOSIS — I480 Paroxysmal atrial fibrillation: Secondary | ICD-10-CM | POA: Diagnosis not present

## 2022-05-10 LAB — GLUCOSE, CAPILLARY
Glucose-Capillary: 189 mg/dL — ABNORMAL HIGH (ref 70–99)
Glucose-Capillary: 358 mg/dL — ABNORMAL HIGH (ref 70–99)
Glucose-Capillary: 80 mg/dL (ref 70–99)
Glucose-Capillary: 81 mg/dL (ref 70–99)
Glucose-Capillary: 161 mg/dL — ABNORMAL HIGH (ref 70–99)

## 2022-05-10 LAB — CBC
HCT: 37.7 % (ref 36.0–46.0)
Hemoglobin: 12.2 g/dL (ref 12.0–15.0)
MCH: 29 pg (ref 26.0–34.0)
MCHC: 32.4 g/dL (ref 30.0–36.0)
MCV: 89.5 fL (ref 80.0–100.0)
Platelets: 272 10*3/uL (ref 150–400)
RBC: 4.21 MIL/uL (ref 3.87–5.11)
RDW: 14 % (ref 11.5–15.5)
WBC: 9.1 10*3/uL (ref 4.0–10.5)
nRBC: 0 % (ref 0.0–0.2)

## 2022-05-10 MED ORDER — ACETAMINOPHEN 500 MG PO TABS
500.0000 mg | ORAL_TABLET | Freq: Four times a day (QID) | ORAL | Status: DC | PRN
  Administered 2022-05-11: 500 mg via ORAL
  Filled 2022-05-10: qty 1

## 2022-05-10 NOTE — Plan of Care (Signed)
  Problem: Activity: Goal: Risk for activity intolerance will decrease Outcome: Progressing   Problem: Nutrition: Goal: Adequate nutrition will be maintained Outcome: Progressing   Problem: Coping: Goal: Level of anxiety will decrease Outcome: Progressing   Problem: Elimination: Goal: Will not experience complications related to urinary retention Outcome: Progressing   

## 2022-05-10 NOTE — Progress Notes (Signed)
Progress Note  Patient Name: Tanya Cole Date of Encounter: 05/10/2022  [facility] Cardiologist: Lorine Bears, MD   Subjective   Patient seen on AM rounds. Denies any chest pain or shortness of breath. Converted to sinus yesterday and has remained in sinus overnight.   Inpatient Medications    Scheduled Meds:  amiodarone  400 mg Oral BID   aspirin  81 mg Oral Daily   atorvastatin  80 mg Oral Daily   enoxaparin (LOVENOX) injection  40 mg Subcutaneous Q24H   folic acid  1 mg Oral Daily   insulin aspart  0-15 Units Subcutaneous TID WC   insulin aspart  0-5 Units Subcutaneous QHS   metoprolol tartrate  25 mg Oral BID   mometasone-formoterol  2 puff Inhalation BID   multivitamin-lutein  1 capsule Oral BID   sodium chloride flush  3 mL Intravenous Q12H   sodium chloride flush  3 mL Intravenous Q12H   Vitamin D (Ergocalciferol)  50,000 Units Oral Q Fri   Continuous Infusions:  sodium chloride     PRN Meds: sodium chloride, acetaminophen, ipratropium-albuterol, LORazepam, nitroGLYCERIN, ondansetron **OR** ondansetron (ZOFRAN) IV, polyvinyl alcohol, sodium chloride flush   Vital Signs    Vitals:   05/10/22 0402 05/10/22 0741 05/10/22 0838 05/10/22 1100  BP:  (!) 132/59  (!) 133/50  Pulse: 63 60 69 62  Resp:  20  20  Temp: 98.1 F (36.7 C) 98.3 F (36.8 C)  98.2 F (36.8 C)  TempSrc: Oral Oral  Oral  SpO2:  97%  95%  Weight:      Height:        Intake/Output Summary (Last 24 hours) at 05/10/2022 1119 Last data filed at 05/10/2022 0600 Gross per 24 hour  Intake 420 ml  Output --  Net 420 ml      05/06/2022    9:18 AM 04/15/2022    2:31 PM 04/06/2022    5:00 AM  Last 3 Weights  Weight (lbs) 126 lb 8.7 oz 126 lb 9.6 oz 127 lb 6.8 oz  Weight (kg) 57.4 kg 57.425 kg 57.8 kg      Telemetry    Sinus with unifocal PVC's rate int he 60's - Personally Reviewed  ECG    No new tracings - Personally Reviewed  Physical Exam   GEN: No acute distress.  Sitting upright in bed eating breakfast. Neck: No JVD Cardiac: RRR, no murmurs, rubs, or gallops.  Respiratory: Clear to auscultation bilaterally. Respirations are unlabored at rest on room air GI: Soft, nontender, non-distended  MS: No edema; No deformity. Neuro:  Nonfocal  Psych: Normal affect   Labs    High Sensitivity Troponin:  No results for input(s): "TROPONINIHS" in the last 720 hours.   Chemistry Recent Labs  Lab 05/06/22 0921 05/07/22 0441 05/08/22 0858  NA 141 141 143  K 3.9 3.2* 3.7  CL 108 108 109  CO2 22 21* 26  GLUCOSE 163* 185* 226*  BUN 19 14 16   CREATININE 0.69 0.65 0.63  CALCIUM 8.9 8.9 9.4  PROT 7.0  --   --   ALBUMIN 4.2  --   --   AST 21  --   --   ALT 16  --   --   ALKPHOS 52  --   --   BILITOT 1.1  --   --   GFRNONAA >60 >60 >60  ANIONGAP 11 12 8     Lipids No results for input(s): "CHOL", "TRIG", "HDL", "LABVLDL", "  LDLCALC", "CHOLHDL" in the last 168 hours.  Hematology Recent Labs  Lab 05/06/22 0921 05/07/22 0441 05/10/22 0428  WBC 12.0* 11.8* 9.1  RBC 4.31 4.74 4.21  HGB 12.4 13.7 12.2  HCT 38.4 41.9 37.7  MCV 89.1 88.4 89.5  MCH 28.8 28.9 29.0  MCHC 32.3 32.7 32.4  RDW 13.6 13.5 14.0  PLT 250 247 272   Thyroid  Recent Labs  Lab 05/07/22 0441  TSH 2.064    BNPNo results for input(s): "BNP", "PROBNP" in the last 168 hours.  DDimer No results for input(s): "DDIMER" in the last 168 hours.   Radiology    No results found.  Cardiac Studies  Echocardiogram completed on 11/01/2021 1. Left ventricular ejection fraction, by estimation, is 60 to 65%. The  left ventricle has normal function. The left ventricle has no regional  wall motion abnormalities. There is mild left ventricular hypertrophy of  the basal-septal segment. Left  ventricular diastolic parameters are indeterminate.   2. Right ventricular systolic function is normal. The right ventricular  size is normal. Mildly increased right ventricular wall thickness. There  is  moderately elevated pulmonary artery systolic pressure.   3. Left atrial size was mildly dilated.   4. The mitral valve is degenerative. Mild mitral valve regurgitation.  Mild mitral stenosis.   5. The aortic valve has an indeterminant number of cusps. There is mild  calcification of the aortic valve. There is mild thickening of the aortic  valve. Aortic valve regurgitation is not visualized. Aortic valve  sclerosis/calcification is present,  without any evidence of aortic stenosis.   6. The inferior vena cava is normal in size with <50% respiratory  variability, suggesting right atrial pressure of 8 mmHg.   Patient Profile     88 y.o. female with a history of coronary artery disease,HFpEF, diabetes, essential hypertension, hyperlipidemia, palpitations, paroxysmal atrial tachycardia, polymyalgia rheumatica, asthma/COPD, who is being seen for atrial fibrillation RVR.  Assessment & Plan    New onset paroxysmal atrial fibrillation with a history of atrial tachycardia -Converted into sinus rhythm yesterday and remains in sinus rhythm today -Continue amiodarone 400 mg twice daily x1 week then decrease dose to 200 mg twice daily x1 week and then 200 mg daily thereafter -Continue metoprolol 25 mg twice daily -CHA2DS2-VASc of 7 -After in-depth discussion with family  by Dr. Tenny Craw about chronic anticoagulation it was felt the patient was not a good candidate for long-term anticoagulation given medication noncompliance, dementia, forgetfulness, and overall high risk -Metoprolol had to be decreased from 25 mg every 6 hours on yesterday to twice daily dosing due to bradycardia  HFpEF -Last echocardiogram was 10/2021 which revealed LVEF 6065%, no WMA, mild LVH, mild MR -Euvolemic on exam today -Continue with metoprolol 25 mg twice daily  CAD with prior stenting -Last left heart catheterization was in 2021 which showed patent proximal LAD stent with mild in-stent restenosis, stable 60% mid LAD  stenosis and significant 90% distal LAD stenosis, she was treated with DES -Continue aspirin 81 mg daily -Continue atorvastatin 80 mg daily  Essential hypertension -Blood pressure this morning 133/50 -Continue metoprolol 25 mg twice daily  Hyperlipidemia -Continue atorvastatin 80 mg daily -LDL 50 on 01/26/2022 which is below goal     For questions or updates, please contact [facility] Please consult [email or web address] for contact info under        Signed, Kaisen Ackers, NP  05/10/2022, 11:19 AM

## 2022-05-10 NOTE — TOC Progression Note (Signed)
Transition of Care [facility]) - Progression Note    Patient Details  Name: Kadey Mihalic MRN: [id number] Date of Birth: [birth year]  Transition of Care [facility]) CM/SW Contact  Caryn Section, RN Phone Number: 05/10/2022, 3:17 PM  Clinical Narrative:   RNCM contacted [facility] and again, as per Verlon Au, they cannot take patient.  Daughter chose [facility].  RNCM attempted to contact Rickey, but unable to reach him, left message.    Authorization started in [place].   Expected Discharge Plan: Skilled Nursing Facility Barriers to Discharge: Continued Medical Work up  Expected Discharge Plan and Services Expected Discharge Plan: Skilled Nursing Facility       Living arrangements for the past 2 months: Single Family Home                                       Social Determinants of Health (SDOH) Interventions    Readmission Risk Interventions    05/08/2022   10:08 AM 05/14/2021    9:26 AM  Readmission Risk Prevention Plan  Transportation Screening Complete Complete  PCP or Specialist Appt within 5-7 Days  Complete  Home Care Screening  Complete  Medication Review (RN CM)  Referral to Pharmacy  Medication Review (RN Care Manager) Complete   PCP or Specialist appointment within 3-5 days of discharge Complete   HRI or Home Care Consult Complete   SW Recovery Care/Counseling Consult Complete   Palliative Care Screening Not Applicable   Skilled Nursing Facility Complete

## 2022-05-10 NOTE — Progress Notes (Signed)
Physical Therapy Treatment Patient Details Name: Tanya Cole MRN: [id number] DOB: 1933/04/18 Today's Date: 05/10/2022   History of Present Illness Patient is an 88 yo female that presented to the ED for AMS and hallucinations, afib with RVR, workup for metabolic encephalopathy. PMH of DM, HF, HTN, MI, polymyalgia rheumatica, COPD.    PT Comments    OOB and progresses gait to 110' with rw and min guard limited by fatigue.  She is able to stand after seated rest for standing arom BLE x 10.  Remains in recliner after session with sitter.    Recommendations for follow up therapy are one component of a multi-disciplinary discharge planning process, led by the attending physician.  Recommendations may be updated based on patient status, additional functional criteria and insurance authorization.  Follow Up Recommendations  Skilled nursing-short term rehab (<3 hours/day)     Assistance Recommended at Discharge    Patient can return home with the following Direct supervision/assist for financial management;Assistance with cooking/housework;Help with stairs or ramp for entrance;Assist for transportation;Direct supervision/assist for medications management;A little help with walking and/or transfers;A little help with bathing/dressing/bathroom   Equipment Recommendations  Other (comment)    Recommendations for Other Services       Precautions / Restrictions Precautions Precautions: Fall Restrictions Weight Bearing Restrictions: No     Mobility  Bed Mobility Overal bed mobility: Needs Assistance Bed Mobility: Supine to Sit     Supine to sit: Min guard          Transfers Overall transfer level: Needs assistance Equipment used: Rolling walker (2 wheels) Transfers: Sit to/from Stand Sit to Stand: Min assist                Ambulation/Gait Ambulation/Gait assistance: Min Chemical engineer (Feet): 110 Feet Assistive device: Rolling walker (2 wheels) Gait  Pattern/deviations: Step-through pattern, Decreased step length - right, Decreased step length - left Gait velocity: decreased         Stairs             Wheelchair Mobility    Modified Rankin (Stroke Patients Only)       Balance Overall balance assessment: Needs assistance Sitting-balance support: Feet supported, Bilateral upper extremity supported Sitting balance-Leahy Scale: Fair     Standing balance support: Bilateral upper extremity supported, Reliant on assistive device for balance Standing balance-Leahy Scale: Fair                              Cognition Arousal/Alertness: Awake/alert Behavior During Therapy: WFL for tasks assessed/performed Overall Cognitive Status: Within Functional Limits for tasks assessed                                          Exercises Other Exercises Other Exercises: standing arom with RW x 10    General Comments        Pertinent Vitals/Pain Pain Assessment Pain Assessment: No/denies pain    Home Living                          Prior Function            PT Goals (current goals can now be found in the care plan section) Progress towards PT goals: Progressing toward goals    Frequency    Min 2X/week  PT Plan Current plan remains appropriate    Co-evaluation              AM-PAC PT "6 Clicks" Mobility   Outcome Measure  Help needed turning from your back to your side while in a flat bed without using bedrails?: None Help needed moving from lying on your back to sitting on the side of a flat bed without using bedrails?: None Help needed moving to and from a bed to a chair (including a wheelchair)?: A Little Help needed standing up from a chair using your arms (e.g., wheelchair or bedside chair)?: A Little Help needed to walk in hospital room?: A Little Help needed climbing 3-5 steps with a railing? : A Little 6 Click Score: 20    End of Session Equipment  Utilized During Treatment: Gait belt Activity Tolerance: Patient tolerated treatment well Patient left: in chair;with nursing/sitter in room Nurse Communication: Mobility status PT Visit Diagnosis: Other abnormalities of gait and mobility (R26.89);Difficulty in walking, not elsewhere classified (R26.2);Muscle weakness (generalized) (M62.81)     Time: 2979-8921 PT Time Calculation (min) (ACUTE ONLY): 12 min  Charges:  $Gait Training: 8-22 mins                   Danielle Dess, PTA 05/10/22, 1:01 PM

## 2022-05-10 NOTE — Progress Notes (Signed)
PROGRESS NOTE    Tanya Cole  CZY:[id number] DOB: 12/09/1932 DOA: 05/06/2022 PCP: Dale Triangle, MD    Brief Narrative:  Tanya Cole is a 88 y.o. female with medical history significant for diabetes mellitus, chronic diastolic dysfunction CHF, coronary artery disease, hypertension, polymyalgia rheumatica, rheumatoid arthritis who was brought into the ER by EMS for evaluation of visual and auditory hallucinations. Most of the history was obtained from patient's daughter who states that over the last 1 week patient has had hallucinations which have progressively worsened.  She was admitted about a month ago for same but at that time had pneumonia.  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  Patient's prednisone has since been discontinued by rheumatology.   Patient states that she lives alone and notes that she has become increasingly forgetful.  Psychiatry was consulted.  Was started on medication.  Cardiology was also consulted for A-fib RVR.  Currently in sinus rhythm.  Confusion/hallucination has improved.  Sitter discontinued after today.  SNF pending.    Consultants:  Psych, cardiology  Procedures:   Antimicrobials:      Subjective: In chair, no shortness of breath or chest pain, no dizzy   Objective: Vitals:   05/10/22 0741 05/10/22 0838 05/10/22 1100 05/10/22 1456  BP: (!) 132/59  (!) 133/50 137/63  Pulse: 60 69 62 (!) 59  Resp: 20  20 18   Temp: 98.3 F (36.8 C)  98.2 F (36.8 C) 98.9 F (37.2 C)  TempSrc: Oral  Oral Oral  SpO2: 97%  95% 97%  Weight:      Height:        Intake/Output Summary (Last 24 hours) at 05/10/2022 1713 Last data filed at 05/10/2022 1700 Gross per 24 hour  Intake 420 ml  Output --  Net 420 ml   Filed Weights   05/06/22 0918  Weight: 57.4 kg    Examination: Calm, NAD Cta no w/r Reg s1/s2 no gallop Soft benign +bs No edema Aaoxox3  Mood and affect appropriate in current setting     Data  Reviewed: I have personally reviewed following labs and imaging studies  CBC: Recent Labs  Lab 05/06/22 0921 05/07/22 0441 05/10/22 0428  WBC 12.0* 11.8* 9.1  HGB 12.4 13.7 12.2  HCT 38.4 41.9 37.7  MCV 89.1 88.4 89.5  PLT 250 247 272   Basic Metabolic Panel: Recent Labs  Lab 05/06/22 0921 05/07/22 0441 05/08/22 0858  NA 141 141 143  K 3.9 3.2* 3.7  CL 108 108 109  CO2 22 21* 26  GLUCOSE 163* 185* 226*  BUN 19 14 16   CREATININE 0.69 0.65 0.63  CALCIUM 8.9 8.9 9.4   GFR: Estimated Creatinine Clearance: 41.1 mL/min (by C-G formula based on SCr of 0.63 mg/dL). Liver Function Tests: Recent Labs  Lab 05/06/22 0921  AST 21  ALT 16  ALKPHOS 52  BILITOT 1.1  PROT 7.0  ALBUMIN 4.2   No results for input(s): "LIPASE", "AMYLASE" in the last 168 hours. No results for input(s): "AMMONIA" in the last 168 hours. Coagulation Profile: No results for input(s): "INR", "PROTIME" in the last 168 hours. Cardiac Enzymes: No results for input(s): "CKTOTAL", "CKMB", "CKMBINDEX", "TROPONINI" in the last 168 hours. BNP (last 3 results) No results for input(s): "PROBNP" in the last 8760 hours. HbA1C: No results for input(s): "HGBA1C" in the last 72 hours. CBG: Recent Labs  Lab 05/09/22 2037 05/10/22 0821 05/10/22 1144 05/10/22 1632 05/10/22 1710  GLUCAP 171* 189* 358* 81 80  Lipid Profile: No results for input(s): "CHOL", "HDL", "LDLCALC", "TRIG", "CHOLHDL", "LDLDIRECT" in the last 72 hours. Thyroid Function Tests: No results for input(s): "TSH", "T4TOTAL", "FREET4", "T3FREE", "THYROIDAB" in the last 72 hours.  Anemia Panel: No results for input(s): "VITAMINB12", "FOLATE", "FERRITIN", "TIBC", "IRON", "RETICCTPCT" in the last 72 hours.  Sepsis Labs: No results for input(s): "PROCALCITON", "LATICACIDVEN" in the last 168 hours.  Recent Results (from the past 240 hour(s))  Resp Panel by RT-PCR (Flu A&B, Covid) Anterior Nasal Swab     Status: None   Collection Time:  05/06/22  9:21 AM   Specimen: Anterior Nasal Swab  Result Value Ref Range Status   SARS Coronavirus 2 by RT PCR NEGATIVE NEGATIVE Final    Comment: (NOTE) SARS-CoV-2 target nucleic acids are NOT DETECTED.  The SARS-CoV-2 RNA is generally detectable in upper respiratory specimens during the acute phase of infection. The lowest concentration of SARS-CoV-2 viral copies this assay can detect is 138 copies/mL. A negative result does not preclude SARS-Cov-2 infection and should not be used as the sole basis for treatment or other patient management decisions. A negative result may occur with  improper specimen collection/handling, submission of specimen other than nasopharyngeal swab, presence of viral mutation(s) within the areas targeted by this assay, and inadequate number of viral copies(<138 copies/mL). A negative result must be combined with clinical observations, patient history, and epidemiological information. The expected result is Negative.  Fact Sheet for Patients:  BloggerCourse.com  Fact Sheet for Healthcare Providers:  SeriousBroker.it  This test is no t yet approved or cleared by the [place] FDA and  has been authorized for detection and/or diagnosis of SARS-CoV-2 by FDA under an Emergency Use Authorization (EUA). This EUA will remain  in effect (meaning this test can be used) for the duration of the COVID-19 declaration under Section 564(b)(1) of the Act, 21 U.S.C.section 360bbb-3(b)(1), unless the authorization is terminated  or revoked sooner.       Influenza A by PCR NEGATIVE NEGATIVE Final   Influenza B by PCR NEGATIVE NEGATIVE Final    Comment: (NOTE) The Xpert Xpress SARS-CoV-2/FLU/RSV plus assay is intended as an aid in the diagnosis of influenza from Nasopharyngeal swab specimens and should not be used as a sole basis for treatment. Nasal washings and aspirates are unacceptable for Xpert Xpress  SARS-CoV-2/FLU/RSV testing.  Fact Sheet for Patients: BloggerCourse.com  Fact Sheet for Healthcare Providers: SeriousBroker.it  This test is not yet approved or cleared by the [place] FDA and has been authorized for detection and/or diagnosis of SARS-CoV-2 by FDA under an Emergency Use Authorization (EUA). This EUA will remain in effect (meaning this test can be used) for the duration of the COVID-19 declaration under Section 564(b)(1) of the Act, 21 U.S.C. section 360bbb-3(b)(1), unless the authorization is terminated or revoked.  Performed at [facility], [address], [place], [place] [address]          Radiology Studies: No results found.      Scheduled Meds:  amiodarone  400 mg Oral BID   aspirin  81 mg Oral Daily   atorvastatin  80 mg Oral Daily   enoxaparin (LOVENOX) injection  40 mg Subcutaneous Q24H   folic acid  1 mg Oral Daily   insulin aspart  0-15 Units Subcutaneous TID WC   insulin aspart  0-5 Units Subcutaneous QHS   metoprolol tartrate  25 mg Oral BID   mometasone-formoterol  2 puff Inhalation BID   multivitamin-lutein  1 capsule  Oral BID   sodium chloride flush  3 mL Intravenous Q12H   sodium chloride flush  3 mL Intravenous Q12H   Vitamin D (Ergocalciferol)  50,000 Units Oral Q Fri   Continuous Infusions:  sodium chloride      Assessment & Plan:   Principal Problem:   Hallucinations Active Problems:   Polymyalgia rheumatica syndrome (HCC)   Hypertension   Diabetes mellitus (HCC)   CAD (coronary artery disease)   Paroxysmal atrial fibrillation with RVR (HCC)   Hallucinations Patient presents to the ER via EMS for evaluation of visual and auditory hallucinations She was recently hospitalized for same and at that time her symptoms were attributed to steroid-induced psychosis because she had been on prednisone for her arthritis. Patient has been off prednisone for over  a week and now has symptoms. Her labs are within normal limits and vital signs are stable Concern for possible dementia 7/8 psych input was appreciated. Dc seroquel. Started on ativan 0.5mg  bid prn.  7/11 appears to have improved.  Not confused.  Continue to monitor PT OT recommends SNF     Polymyalgia rheumatica syndrome (HCC) Stable and not acutely exacerbated 7/11 continue methotrexate weekly and folic acid        PAF/PAT Was with rvr Cards consulted, input was appreciated- no good a/c currently concerns of her medical issues currently and will defer for now. Chadsvac 7. Was started on IV Cardizem Then was in AT Started on beta blk 7/11Now in sr Not a candidate for anticoagulation at this time-was discussed with cardiology and family and decision made to forego anticoagulation given fall risk and history of medication noncompliance Was started on amiodarone and will continue along with metoprolol Plan to continue amiodarone load with 400 mg twice daily daily x1 week, followed by 200 mg twice daily x1 week, and then 200 mg daily thereafter. Continue metoprolol tartrate 25 mg twice daily Follow-up with cardiology in 2 weeks after discharge Cardiology signed off          Hypertension Stable continue with beta-blockers    Diabetes mellitus (HCC) Hold metformin Maintain consistent carbohydrate diet Glycemic control sliding scale     CAD (coronary artery disease) Stable Continue aspirin, atorvastatin and metoprolol     DVT prophylaxis: lovenox Code Status:DNR Family Communication:  not at bedside Disposition Plan: SNF Status is: Inpatient Patient remains inpatient as patient: Unsafe discharge.  SNF pending.            LOS: 3 days   Time spent: 35 min    Lynn Ito, MD [facility] Pager [phone]  If 7PM-7AM, please contact night-coverage 05/10/2022, 5:13 PM

## 2022-05-10 NOTE — Plan of Care (Signed)

## 2022-05-10 NOTE — Progress Notes (Signed)
Occupational Therapy Treatment Patient Details Name: Tanya Cole MRN: [id number] DOB: [birth year] Today's Date: 05/10/2022   History of present illness Patient is an 88 yo female that presented to the ED for AMS and hallucinations, afib with RVR, workup for metabolic encephalopathy. PMH of DM, HF, HTN, MI, polymyalgia rheumatica, COPD.   OT comments  Pt seen for OT tx. Pt up in recliner, agreeable. Pt completed ADL transfers from the recliner and BSC over toilet with CGA + RW, ambulating 20+20' with RW and CGA, slowly. Pt endorsing feeling weak throughout. Pt completed pericare and clothing mgt with supervision in standing. Pt progressing towards goals, continue to recommend SNF at this time.    Recommendations for follow up therapy are one component of a multi-disciplinary discharge planning process, led by the attending physician.  Recommendations may be updated based on patient status, additional functional criteria and insurance authorization.    Follow Up Recommendations  Skilled nursing-short term rehab (<3 hours/day)    Assistance Recommended at Discharge Frequent or constant Supervision/Assistance  Patient can return home with the following  A little help with walking and/or transfers;A little help with bathing/dressing/bathroom;Assistance with cooking/housework;Direct supervision/assist for medications management   Equipment Recommendations  None recommended by OT    Recommendations for Other Services      Precautions / Restrictions Precautions Precautions: Fall Restrictions Weight Bearing Restrictions: No       Mobility Bed Mobility               General bed mobility comments: NT, up in recliner    Transfers Overall transfer level: Needs assistance Equipment used: Rolling walker (2 wheels) Transfers: Sit to/from Stand Sit to Stand: Min guard                 Balance Overall balance assessment: Needs assistance Sitting-balance support: No upper  extremity supported, Feet supported Sitting balance-Leahy Scale: Good     Standing balance support: Bilateral upper extremity supported, Reliant on assistive device for balance, Single extremity supported, No upper extremity supported, During functional activity Standing balance-Leahy Scale: Fair Standing balance comment: able to briefly let go of RW to clean hands in standing                           ADL either performed or assessed with clinical judgement   ADL Overall ADL's : Needs assistance/impaired     Grooming: Wash/dry hands;Standing;Supervision/safety;Min guard Grooming Details (indicate cue type and reason): SBA                 Toilet Transfer: Min guard;Rolling walker (2 wheels);BSC/3in1;Regular Teacher, adult education Details (indicate cue type and reason): BSC over toilet Toileting- Clothing Manipulation and Hygiene: Supervision/safety;Sit to/from stand;Min guard Toileting - Clothing Manipulation Details (indicate cue type and reason): SBA     Functional mobility during ADLs: Min guard;Rolling walker (2 wheels)      Extremity/Trunk Assessment              Vision       Perception     Praxis      Cognition Arousal/Alertness: Awake/alert Behavior During Therapy: WFL for tasks assessed/performed Overall Cognitive Status: Within Functional Limits for tasks assessed                                          Exercises  Shoulder Instructions       General Comments      Pertinent Vitals/ Pain       Pain Assessment Pain Assessment: No/denies pain  Home Living                                          Prior Functioning/Environment              Frequency  Min 2X/week        Progress Toward Goals  OT Goals(current goals can now be found in the care plan section)  Progress towards OT goals: Progressing toward goals  Acute Rehab OT Goals Patient Stated Goal: to get better OT Goal  Formulation: With patient Time For Goal Achievement: 05/22/22 Potential to Achieve Goals: Good  Plan Discharge plan remains appropriate;Frequency remains appropriate    Co-evaluation                 AM-PAC OT "6 Clicks" Daily Activity     Outcome Measure   Help from another person eating meals?: None Help from another person taking care of personal grooming?: A Little Help from another person toileting, which includes using toliet, bedpan, or urinal?: A Little Help from another person bathing (including washing, rinsing, drying)?: A Lot Help from another person to put on and taking off regular upper body clothing?: A Little Help from another person to put on and taking off regular lower body clothing?: A Little 6 Click Score: 18    End of Session Equipment Utilized During Treatment: Rolling walker (2 wheels)  OT Visit Diagnosis: Unsteadiness on feet (R26.81);Muscle weakness (generalized) (M62.81);Other symptoms and signs involving cognitive function   Activity Tolerance Patient tolerated treatment well   Patient Left in chair;with call bell/phone within reach;with nursing/sitter in room   Nurse Communication          Time: 1607-3710 OT Time Calculation (min): 12 min  Charges: OT General Charges $OT Visit: 1 Visit OT Treatments $Self Care/Home Management : 8-22 mins  Arman Filter., MPH, MS, OTR/L ascom [phone] 05/10/22, 1:04 PM

## 2022-05-10 NOTE — Care Management Important Message (Signed)
Important Message  Patient Details  Name: Tanya Cole MRN: [id number] Date of Birth: April 09, 1933   Medicare Important Message Given:  Yes  Reviewed Medicare IM with Azell Der, daughter, at [phone].  Copy of Medicare IM left in patient's room for reference.   [name] 05/10/2022, 12:17 PM

## 2022-05-11 DIAGNOSIS — I5030 Unspecified diastolic (congestive) heart failure: Secondary | ICD-10-CM | POA: Diagnosis not present

## 2022-05-11 DIAGNOSIS — R410 Disorientation, unspecified: Secondary | ICD-10-CM | POA: Diagnosis not present

## 2022-05-11 DIAGNOSIS — R443 Hallucinations, unspecified: Secondary | ICD-10-CM | POA: Diagnosis not present

## 2022-05-11 DIAGNOSIS — M130 Polyarthritis, unspecified: Secondary | ICD-10-CM | POA: Diagnosis not present

## 2022-05-11 DIAGNOSIS — H3530 Unspecified macular degeneration: Secondary | ICD-10-CM | POA: Diagnosis not present

## 2022-05-11 DIAGNOSIS — R2681 Unsteadiness on feet: Secondary | ICD-10-CM | POA: Diagnosis not present

## 2022-05-11 DIAGNOSIS — I2510 Atherosclerotic heart disease of native coronary artery without angina pectoris: Secondary | ICD-10-CM | POA: Diagnosis not present

## 2022-05-11 DIAGNOSIS — R079 Chest pain, unspecified: Secondary | ICD-10-CM | POA: Diagnosis not present

## 2022-05-11 DIAGNOSIS — I4891 Unspecified atrial fibrillation: Secondary | ICD-10-CM | POA: Diagnosis not present

## 2022-05-11 DIAGNOSIS — E538 Deficiency of other specified B group vitamins: Secondary | ICD-10-CM | POA: Diagnosis not present

## 2022-05-11 DIAGNOSIS — Z029 Encounter for administrative examinations, unspecified: Secondary | ICD-10-CM | POA: Diagnosis not present

## 2022-05-11 DIAGNOSIS — E875 Hyperkalemia: Secondary | ICD-10-CM | POA: Diagnosis not present

## 2022-05-11 DIAGNOSIS — M353 Polymyalgia rheumatica: Secondary | ICD-10-CM

## 2022-05-11 DIAGNOSIS — M069 Rheumatoid arthritis, unspecified: Secondary | ICD-10-CM | POA: Diagnosis not present

## 2022-05-11 DIAGNOSIS — J441 Chronic obstructive pulmonary disease with (acute) exacerbation: Secondary | ICD-10-CM | POA: Diagnosis not present

## 2022-05-11 DIAGNOSIS — I10 Essential (primary) hypertension: Secondary | ICD-10-CM | POA: Diagnosis not present

## 2022-05-11 DIAGNOSIS — M810 Age-related osteoporosis without current pathological fracture: Secondary | ICD-10-CM | POA: Diagnosis not present

## 2022-05-11 DIAGNOSIS — M6281 Muscle weakness (generalized): Secondary | ICD-10-CM | POA: Diagnosis not present

## 2022-05-11 DIAGNOSIS — R2689 Other abnormalities of gait and mobility: Secondary | ICD-10-CM | POA: Diagnosis not present

## 2022-05-11 DIAGNOSIS — D649 Anemia, unspecified: Secondary | ICD-10-CM | POA: Diagnosis not present

## 2022-05-11 DIAGNOSIS — I25118 Atherosclerotic heart disease of native coronary artery with other forms of angina pectoris: Secondary | ICD-10-CM | POA: Diagnosis not present

## 2022-05-11 DIAGNOSIS — I480 Paroxysmal atrial fibrillation: Secondary | ICD-10-CM | POA: Diagnosis not present

## 2022-05-11 DIAGNOSIS — K5900 Constipation, unspecified: Secondary | ICD-10-CM | POA: Diagnosis not present

## 2022-05-11 DIAGNOSIS — E785 Hyperlipidemia, unspecified: Secondary | ICD-10-CM | POA: Diagnosis not present

## 2022-05-11 DIAGNOSIS — R110 Nausea: Secondary | ICD-10-CM | POA: Diagnosis not present

## 2022-05-11 DIAGNOSIS — R5381 Other malaise: Secondary | ICD-10-CM | POA: Diagnosis not present

## 2022-05-11 DIAGNOSIS — J449 Chronic obstructive pulmonary disease, unspecified: Secondary | ICD-10-CM | POA: Diagnosis not present

## 2022-05-11 DIAGNOSIS — E1159 Type 2 diabetes mellitus with other circulatory complications: Secondary | ICD-10-CM | POA: Diagnosis not present

## 2022-05-11 DIAGNOSIS — E7800 Pure hypercholesterolemia, unspecified: Secondary | ICD-10-CM | POA: Diagnosis not present

## 2022-05-11 DIAGNOSIS — I5032 Chronic diastolic (congestive) heart failure: Secondary | ICD-10-CM | POA: Diagnosis not present

## 2022-05-11 DIAGNOSIS — D509 Iron deficiency anemia, unspecified: Secondary | ICD-10-CM | POA: Diagnosis not present

## 2022-05-11 DIAGNOSIS — E119 Type 2 diabetes mellitus without complications: Secondary | ICD-10-CM | POA: Diagnosis not present

## 2022-05-11 LAB — GLUCOSE, CAPILLARY
Glucose-Capillary: 182 mg/dL — ABNORMAL HIGH (ref 70–99)
Glucose-Capillary: 188 mg/dL — ABNORMAL HIGH (ref 70–99)

## 2022-05-11 LAB — BASIC METABOLIC PANEL
Anion gap: 7 (ref 5–15)
BUN: 25 mg/dL — ABNORMAL HIGH (ref 8–23)
CO2: 23 mmol/L (ref 22–32)
Calcium: 8.5 mg/dL — ABNORMAL LOW (ref 8.9–10.3)
Chloride: 108 mmol/L (ref 98–111)
Creatinine, Ser: 0.75 mg/dL (ref 0.44–1.00)
GFR, Estimated: >60 mL/min (ref >60)
Glucose, Bld: 182 mg/dL — ABNORMAL HIGH (ref 70–99)
Potassium: 3.9 mmol/L (ref 3.5–5.1)
Sodium: 138 mmol/L (ref 135–145)

## 2022-05-11 LAB — CBC
HCT: 36.2 % (ref 36.0–46.0)
Hemoglobin: 11.7 g/dL — ABNORMAL LOW (ref 12.0–15.0)
MCH: 28.5 pg (ref 26.0–34.0)
MCHC: 32.3 g/dL (ref 30.0–36.0)
MCV: 88.3 fL (ref 80.0–100.0)
Platelets: 288 10*3/uL (ref 150–400)
RBC: 4.1 MIL/uL (ref 3.87–5.11)
RDW: 13.8 % (ref 11.5–15.5)
WBC: 9.3 10*3/uL (ref 4.0–10.5)
nRBC: 0 % (ref 0.0–0.2)

## 2022-05-11 MED ORDER — AMIODARONE HCL 200 MG PO TABS: ORAL_TABLET | ORAL | 0 refills | Status: DC

## 2022-05-11 MED ORDER — LORAZEPAM 0.5 MG PO TABS: 0.5000 mg | ORAL_TABLET | Freq: Two times a day (BID) | ORAL | 0 refills | Status: AC | PRN

## 2022-05-11 NOTE — TOC Transition Note (Signed)
Transition of Care [facility]) - CM/SW Discharge Note   Patient Details  Name: Tanya Cole MRN: [id number] Date of Birth: October 27, 1933  [facility]) CM/SW Contact:  Margarito Liner, LCSW Phone Number: 05/11/2022, 1:22 PM   Clinical Narrative: Patient has orders to discharge to [facility] today. RN will call report to [phone] (Room E1). Patient walked 110 feet min assist with PT yesterday so told daughter we cannot guarantee insurance would cover EMS transport. Daughter will pick her up, asked RN to call her and coordinate a pickup time. No further concerns. CSW signing off.    Final next level of care: Skilled Nursing Facility Barriers to Discharge: Barriers Resolved   Patient Goals and CMS Choice Patient states their goals for this hospitalization and ongoing recovery are:: SNF CMS Medicare.gov Compare Post Acute Care list provided to:: Patient Represenative (must comment) Choice offered to / list presented to : Adult Children  Discharge Placement PASRR number recieved: 05/08/22            Patient chooses bed at: Other - please specify in the comment section below: ([facility]) Patient to be transferred to facility by: Daughter Name of family member notified: Azell Der Patient and family notified of of transfer: 05/11/22  Discharge Plan and Services                                     Social Determinants of Health (SDOH) Interventions     Readmission Risk Interventions    05/08/2022   10:08 AM 05/14/2021    9:26 AM  Readmission Risk Prevention Plan  Transportation Screening Complete Complete  PCP or Specialist Appt within 5-7 Days  Complete  Home Care Screening  Complete  Medication Review (RN CM)  Referral to Pharmacy  Medication Review (RN Care Manager) Complete   PCP or Specialist appointment within 3-5 days of discharge Complete   HRI or Home Care Consult Complete   SW Recovery Care/Counseling Consult Complete    Palliative Care Screening Not Applicable   Skilled Nursing Facility Complete

## 2022-05-11 NOTE — Discharge Summary (Signed)
Physician Discharge Summary  [place] PQD:[id number] DOB: 1933/06/29 DOA: 05/06/2022  PCP: Einar Pheasant, MD  Admit date: 05/06/2022 Discharge date: 05/11/2022  Admitted From: home  Disposition:  home   Recommendations for Outpatient Follow-up:  Follow up with PCP in 1-2 weeks F/u w/ cardio, Dr. Fletcher Anon, in 2 weeks   Home Health: no  Equipment/Devices:  Discharge Condition: stable  CODE STATUS: DNR Diet recommendation: Heart Healthy / Carb Modified   Brief/Interim Summary: HPI was taken from Dr. Francine Graven: Tanya Cole is a 88 y.o. female with medical history significant for diabetes mellitus, chronic diastolic dysfunction CHF, coronary artery disease, hypertension, polymyalgia rheumatica, rheumatoid arthritis who was brought into the ER by EMS for evaluation of visual and auditory hallucinations. Most of the history was obtained from patient's daughter who states that over the last 1 week patient has had hallucinations which have progressively worsened.  She was admitted about a month ago for same but at that time had pneumonia.  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  Patient's prednisone has since been discontinued by rheumatology.   Patient states that she lives alone and notes that she has become increasingly forgetful. She denies having any cough, no fever, no chills, no abdominal pain, no urinary symptoms, no headache, no blurred vision no focal deficits.  As per Dr. Kurtis Bushman: Psychiatry was consulted.  Was started on medication.  Cardiology was also consulted for A-fib RVR.  Currently in sinus rhythm.  Confusion/hallucination has improved.  Sitter discontinued after today.  SNF pending.    As per Dr. Jimmye Norman 05/11/22: Pt was medically stable for d/c to SNF. Pt was d/c to SNF. For more information, please see previous progress/consult notes.   Discharge Diagnoses:  Principal Problem:   Hallucinations Active Problems:   Polymyalgia rheumatica  syndrome (HCC)   Hypertension   Diabetes mellitus (HCC)   CAD (coronary artery disease)   Paroxysmal atrial fibrillation with RVR (HCC)  Hallucinations: w/ visual & auditory hallucination. Etiology unclear. D/c seroquel and started on ativan as per psych.   PMR: continue on home dose of prednisone, folic acid  PAF: not on anticoagulation secondary to high fall risk. Continue on amiodarone taper (as per cardio), metoprolol. Continue on amio 469m BID x 2 days more, 2018mBID x 1 week and then 20073maily thereafter as per cardio.  F/u w/ cardio in 2 weeks  HTN: continue on metoprolol  DM2: likely poorly controlled. Continue on anti-DM meds at d/c   Hx of CAD: continue on aspirin, statin & metoprolol    Discharge Instructions  Discharge Instructions     Diet - low sodium heart healthy   Complete by: As directed    Diet Carb Modified   Complete by: As directed    Discharge instructions   Complete by: As directed    F/u w/ PCP in 1-2 weeks. F/u w/ cardio, Dr. AriFletcher Anonn 2 weeks   Increase activity slowly   Complete by: As directed       Allergies as of 05/11/2022       Reactions   Tramadol Itching, Nausea And Vomiting        Medication List     STOP taking these medications    QUEtiapine 25 MG tablet Commonly known as: SEROQUEL       TAKE these medications    acetaminophen 500 MG tablet Commonly known as: TYLENOL Take 500-1,000 mg by mouth every 6 (six) hours as needed for mild pain or moderate  pain.   amiodarone 200 MG tablet Commonly known as: Pacerone 422m BID x 2 days, 2023mBID x 1 week, 20014maily there after   aspirin 81 MG tablet Take 81 mg by mouth daily.   atorvastatin 80 MG tablet Commonly known as: LIPITOR TAKE 1 TABLET BY MOUTH ONCE DAILY AT  6PM   fluticasone-salmeterol 250-50 MCG/ACT Aepb Commonly known as: ADVAIR Inhale 1 puff into the lungs in the morning and at bedtime.   folic acid 1 MG tablet Commonly known as: FOLVITE Take  1 mg by mouth daily.   ipratropium-albuterol 0.5-2.5 (3) MG/3ML Soln Commonly known as: DUONEB Take 3 mLs by nebulization 2 (two) times daily. What changed:  when to take this reasons to take this   LORazepam 0.5 MG tablet Commonly known as: Ativan Take 1 tablet (0.5 mg total) by mouth 2 (two) times daily as needed for up to 1 day for anxiety or sleep.   metFORMIN 500 MG tablet Commonly known as: GLUCOPHAGE TAKE 1 TABLET BY MOUTH TWICE DAILY WITH A MEAL   metoprolol tartrate 50 MG tablet Commonly known as: LOPRESSOR Take 1 tablet (50 mg total) by mouth 2 (two) times daily.   nitroGLYCERIN 0.4 MG SL tablet Commonly known as: NITROSTAT Place 1 tablet (0.4 mg total) under the tongue every 5 (five) minutes as needed for chest pain.   predniSONE 2.5 MG tablet Commonly known as: DELTASONE Take 2.5 mg by mouth daily with breakfast.   PreserVision AREDS 2 Caps Take 1 capsule by mouth 2 (two) times daily.   ProAir HFA 108 (90 Base) MCG/ACT inhaler Generic drug: albuterol Inhale 2 puffs into the lungs every 4 (four) hours as needed for wheezing or shortness of breath.   Vitamin D (Ergocalciferol) 1.25 MG (50000 UNIT) Caps capsule Commonly known as: DRISDOL Take 50,000 Units by mouth every Friday.        Contact information for after-discharge care     Destination     [facility] .   Service: Skilled Nursing Contact information: [address] [phone]                 Allergies  Allergen Reactions   Tramadol Itching and Nausea And Vomiting    Consultations: Psych  Cardio    Procedures/Studies: DG Chest Portable 1 View  Result Date: 05/06/2022 CLINICAL DATA:  Altered mental status EXAM: PORTABLE CHEST 1 VIEW COMPARISON:  04/15/2022 chest radiograph. FINDINGS: Stable cardiomediastinal silhouette with normal heart size. No pneumothorax. No pleural effusion. No pulmonary edema. Stable  calcified left lung base granuloma. Stable streaky lingular scarring. No acute consolidative airspace disease. IMPRESSION: 1. No active cardiopulmonary disease. 2. Stable streaky lingular scarring. Electronically Signed   By: JasIlona SorrelD.   On: 05/06/2022 09:57   DG Chest 2 View  Result Date: 04/16/2022 CLINICAL DATA:  [age]ar old female with recent hospitalization EXAM: CHEST - 2 VIEW COMPARISON:  04/02/2022 FINDINGS: Cardiomediastinal silhouette unchanged in size and contour. Evidence of prior PTC I. no evidence of central vascular congestion. No interlobular septal thickening. Focal opacity at the left lung base corresponds to a prior chest x-ray, with overall improving aeration. No pneumothorax or pleural effusion. Coarsened interstitial markings, with no confluent airspace disease. No acute displaced fracture. Degenerative changes of the spine. IMPRESSION: Improving aeration of the lungs, with no evidence of acute cardiopulmonary disease. Electronically Signed   By: JaiCorrie MckusickO.   On: 04/16/2022 11:23   (  Echo, Carotid, EGD, Colonoscopy, ERCP)    Subjective: Pt c/o malaise    Discharge Exam: Vitals:   05/11/22 0726 05/11/22 1149  BP: (!) 152/59 (!) 141/57  Pulse: 60 (!) 57  Resp: 18 20  Temp: 98.2 F (36.8 C) 98.3 F (36.8 C)  SpO2: 97% 98%   Vitals:   05/10/22 2332 05/11/22 0348 05/11/22 0726 05/11/22 1149  BP: (!) 157/59 (!) 143/50 (!) 152/59 (!) 141/57  Pulse: (!) 58 62 60 (!) 57  Resp: '18  18 20  ' Temp: 98.4 F (36.9 C) 98.2 F (36.8 C) 98.2 F (36.8 C) 98.3 F (36.8 C)  TempSrc:  Oral Oral Oral  SpO2: 98% 96% 97% 98%  Weight:      Height:        General: Pt is alert, awake, not in acute distress Cardiovascular: S1/S2 +, no rubs, no gallops Respiratory: CTA bilaterally, no wheezing, no rhonchi Abdominal: Soft, NT, ND, bowel sounds + Extremities:  no cyanosis    The results of significant diagnostics from this hospitalization (including imaging,  microbiology, ancillary and laboratory) are listed below for reference.     Microbiology: Recent Results (from the past 240 hour(s))  Resp Panel by RT-PCR (Flu A&B, Covid) Anterior Nasal Swab     Status: None   Collection Time: 05/06/22  9:21 AM   Specimen: Anterior Nasal Swab  Result Value Ref Range Status   SARS Coronavirus 2 by RT PCR NEGATIVE NEGATIVE Final    Comment: (NOTE) SARS-CoV-2 target nucleic acids are NOT DETECTED.  The SARS-CoV-2 RNA is generally detectable in upper respiratory specimens during the acute phase of infection. The lowest concentration of SARS-CoV-2 viral copies this assay can detect is 138 copies/mL. A negative result does not preclude SARS-Cov-2 infection and should not be used as the sole basis for treatment or other patient management decisions. A negative result may occur with  improper specimen collection/handling, submission of specimen other than nasopharyngeal swab, presence of viral mutation(s) within the areas targeted by this assay, and inadequate number of viral copies(<138 copies/mL). A negative result must be combined with clinical observations, patient history, and epidemiological information. The expected result is Negative.  Fact Sheet for Patients:  EntrepreneurPulse.com.au  Fact Sheet for Healthcare Providers:  IncredibleEmployment.be  This test is no t yet approved or cleared by the [place] FDA and  has been authorized for detection and/or diagnosis of SARS-CoV-2 by FDA under an Emergency Use Authorization (EUA). This EUA will remain  in effect (meaning this test can be used) for the duration of the COVID-19 declaration under Section 564(b)(1) of the Act, 21 U.S.C.section 360bbb-3(b)(1), unless the authorization is terminated  or revoked sooner.       Influenza A by PCR NEGATIVE NEGATIVE Final   Influenza B by PCR NEGATIVE NEGATIVE Final    Comment: (NOTE) The Xpert Xpress  SARS-CoV-2/FLU/RSV plus assay is intended as an aid in the diagnosis of influenza from Nasopharyngeal swab specimens and should not be used as a sole basis for treatment. Nasal washings and aspirates are unacceptable for Xpert Xpress SARS-CoV-2/FLU/RSV testing.  Fact Sheet for Patients: EntrepreneurPulse.com.au  Fact Sheet for Healthcare Providers: IncredibleEmployment.be  This test is not yet approved or cleared by the [place] FDA and has been authorized for detection and/or diagnosis of SARS-CoV-2 by FDA under an Emergency Use Authorization (EUA). This EUA will remain in effect (meaning this test can be used) for the duration of the COVID-19 declaration under Section 564(b)(1) of the Act,  21 U.S.C. section 360bbb-3(b)(1), unless the authorization is terminated or revoked.  Performed at [facility], [address] [place], [place] [address]      Labs: BNP (last 3 results) Recent Labs    09/15/21 0914 10/31/21 0931 01/25/22 1128  BNP 180.9* 210.4* 829.9*   Basic Metabolic Panel: Recent Labs  Lab 05/06/22 0921 05/07/22 0441 05/08/22 0858 05/11/22 0859  NA 141 141 143 138  K 3.9 3.2* 3.7 3.9  CL 108 108 109 108  CO2 22 21* 26 23  GLUCOSE 163* 185* 226* 182*  BUN '19 14 16 ' 25*  CREATININE 0.69 0.65 0.63 0.75  CALCIUM 8.9 8.9 9.4 8.5*   Liver Function Tests: Recent Labs  Lab 05/06/22 0921  AST 21  ALT 16  ALKPHOS 52  BILITOT 1.1  PROT 7.0  ALBUMIN 4.2   No results for input(s): "LIPASE", "AMYLASE" in the last 168 hours. No results for input(s): "AMMONIA" in the last 168 hours. CBC: Recent Labs  Lab 05/06/22 0921 05/07/22 0441 05/10/22 0428 05/11/22 0859  WBC 12.0* 11.8* 9.1 9.3  HGB 12.4 13.7 12.2 11.7*  HCT 38.4 41.9 37.7 36.2  MCV 89.1 88.4 89.5 88.3  PLT 250 247 272 288   Cardiac Enzymes: No results for input(s): "CKTOTAL", "CKMB", "CKMBINDEX", "TROPONINI" in the last 168  hours. BNP: Invalid input(s): "POCBNP" CBG: Recent Labs  Lab 05/10/22 1632 05/10/22 1710 05/10/22 2052 05/11/22 0908 05/11/22 1151  GLUCAP 81 80 161* 182* 188*   D-Dimer No results for input(s): "DDIMER" in the last 72 hours. Hgb A1c No results for input(s): "HGBA1C" in the last 72 hours. Lipid Profile No results for input(s): "CHOL", "HDL", "LDLCALC", "TRIG", "CHOLHDL", "LDLDIRECT" in the last 72 hours. Thyroid function studies No results for input(s): "TSH", "T4TOTAL", "T3FREE", "THYROIDAB" in the last 72 hours.  Invalid input(s): "FREET3" Anemia work up No results for input(s): "VITAMINB12", "FOLATE", "FERRITIN", "TIBC", "IRON", "RETICCTPCT" in the last 72 hours. Urinalysis    Component Value Date/Time   COLORURINE AMBER (A) 05/06/2022 1143   APPEARANCEUR HAZY (A) 05/06/2022 1143   APPEARANCEUR Clear 02/18/2013 2004   LABSPEC 1.028 05/06/2022 1143   LABSPEC 1.024 02/18/2013 2004   PHURINE 5.0 05/06/2022 1143   GLUCOSEU NEGATIVE 05/06/2022 1143   GLUCOSEU NEGATIVE 01/28/2015 0810   HGBUR NEGATIVE 05/06/2022 1143   BILIRUBINUR NEGATIVE 05/06/2022 1143   BILIRUBINUR neg 01/16/2015 1437   BILIRUBINUR Negative 02/18/2013 2004   KETONESUR 5 (A) 05/06/2022 1143   PROTEINUR >=300 (A) 05/06/2022 1143   UROBILINOGEN 0.2 01/28/2015 0810   NITRITE NEGATIVE 05/06/2022 1143   LEUKOCYTESUR NEGATIVE 05/06/2022 1143   LEUKOCYTESUR Trace 02/18/2013 2004   Sepsis Labs Recent Labs  Lab 05/06/22 0921 05/07/22 0441 05/10/22 0428 05/11/22 0859  WBC 12.0* 11.8* 9.1 9.3   Microbiology Recent Results (from the past 240 hour(s))  Resp Panel by RT-PCR (Flu A&B, Covid) Anterior Nasal Swab     Status: None   Collection Time: 05/06/22  9:21 AM   Specimen: Anterior Nasal Swab  Result Value Ref Range Status   SARS Coronavirus 2 by RT PCR NEGATIVE NEGATIVE Final    Comment: (NOTE) SARS-CoV-2 target nucleic acids are NOT DETECTED.  The SARS-CoV-2 RNA is generally detectable in upper  respiratory specimens during the acute phase of infection. The lowest concentration of SARS-CoV-2 viral copies this assay can detect is 138 copies/mL. A negative result does not preclude SARS-Cov-2 infection and should not be used as the sole basis for treatment or other patient management  decisions. A negative result may occur with  improper specimen collection/handling, submission of specimen other than nasopharyngeal swab, presence of viral mutation(s) within the areas targeted by this assay, and inadequate number of viral copies(<138 copies/mL). A negative result must be combined with clinical observations, patient history, and epidemiological information. The expected result is Negative.  Fact Sheet for Patients:  EntrepreneurPulse.com.au  Fact Sheet for Healthcare Providers:  IncredibleEmployment.be  This test is no t yet approved or cleared by the [place] FDA and  has been authorized for detection and/or diagnosis of SARS-CoV-2 by FDA under an Emergency Use Authorization (EUA). This EUA will remain  in effect (meaning this test can be used) for the duration of the COVID-19 declaration under Section 564(b)(1) of the Act, 21 U.S.C.section 360bbb-3(b)(1), unless the authorization is terminated  or revoked sooner.       Influenza A by PCR NEGATIVE NEGATIVE Final   Influenza B by PCR NEGATIVE NEGATIVE Final    Comment: (NOTE) The Xpert Xpress SARS-CoV-2/FLU/RSV plus assay is intended as an aid in the diagnosis of influenza from Nasopharyngeal swab specimens and should not be used as a sole basis for treatment. Nasal washings and aspirates are unacceptable for Xpert Xpress SARS-CoV-2/FLU/RSV testing.  Fact Sheet for Patients: EntrepreneurPulse.com.au  Fact Sheet for Healthcare Providers: IncredibleEmployment.be  This test is not yet approved or cleared by the [place] FDA and has been  authorized for detection and/or diagnosis of SARS-CoV-2 by FDA under an Emergency Use Authorization (EUA). This EUA will remain in effect (meaning this test can be used) for the duration of the COVID-19 declaration under Section 564(b)(1) of the Act, 21 U.S.C. section 360bbb-3(b)(1), unless the authorization is terminated or revoked.  Performed at [facility], [address], [place], [place] [address]      Time coordinating discharge: Over 30 minutes  SIGNED:   Wyvonnia Dusky, MD  [facility] 05/11/2022, 12:53 PM Pager   If 7PM-7AM, please contact night-coverage [email or web address]

## 2022-05-11 NOTE — Progress Notes (Signed)
AVS reviewed with the patient and her daughter Elita Quick, all questions answered. Her IV was removed and tele box was returned. NS assisted patient to the exit and the patient's family provided transportation to SNF

## 2022-05-11 NOTE — Progress Notes (Signed)
Report Called to Ronney Asters, LPN at [facility].

## 2022-05-11 NOTE — TOC Progression Note (Signed)
Transition of Care [facility]) - Progression Note    Patient Details  Name: Tanya Cole MRN: [id number] Date of Birth: Sep 02, 1933  Transition of Care [facility]) CM/SW Contact  Margarito Liner, LCSW Phone Number: 05/11/2022, 8:45 AM  Clinical Narrative:   [facility] approved: [id number]. Valid 7/12-7/14. Left voicemail for [facility] admissions coordinator to confirm he received voicemail from [facility] coworker yesterday afternoon accepting bed offer and give authorization details. Also asked if they would have a bed today if she is stable.  Expected Discharge Plan: Skilled Nursing Facility Barriers to Discharge: Continued Medical Work up  Expected Discharge Plan and Services Expected Discharge Plan: Skilled Nursing Facility       Living arrangements for the past 2 months: Single Family Home                                       Social Determinants of Health (SDOH) Interventions    Readmission Risk Interventions    05/08/2022   10:08 AM 05/14/2021    9:26 AM  Readmission Risk Prevention Plan  Transportation Screening Complete Complete  PCP or Specialist Appt within 5-7 Days  Complete  Home Care Screening  Complete  Medication Review (RN CM)  Referral to Pharmacy  Medication Review (RN Care Manager) Complete   PCP or Specialist appointment within 3-5 days of discharge Complete   HRI or Home Care Consult Complete   SW Recovery Care/Counseling Consult Complete   Palliative Care Screening Not Applicable   Skilled Nursing Facility Complete

## 2022-05-12 DIAGNOSIS — E875 Hyperkalemia: Secondary | ICD-10-CM | POA: Diagnosis not present

## 2022-05-12 DIAGNOSIS — M353 Polymyalgia rheumatica: Secondary | ICD-10-CM | POA: Diagnosis not present

## 2022-05-12 DIAGNOSIS — D649 Anemia, unspecified: Secondary | ICD-10-CM | POA: Diagnosis not present

## 2022-05-12 DIAGNOSIS — I480 Paroxysmal atrial fibrillation: Secondary | ICD-10-CM | POA: Diagnosis not present

## 2022-05-12 DIAGNOSIS — I5030 Unspecified diastolic (congestive) heart failure: Secondary | ICD-10-CM | POA: Diagnosis not present

## 2022-05-12 DIAGNOSIS — E119 Type 2 diabetes mellitus without complications: Secondary | ICD-10-CM | POA: Diagnosis not present

## 2022-05-12 DIAGNOSIS — J441 Chronic obstructive pulmonary disease with (acute) exacerbation: Secondary | ICD-10-CM | POA: Diagnosis not present

## 2022-05-12 DIAGNOSIS — M810 Age-related osteoporosis without current pathological fracture: Secondary | ICD-10-CM | POA: Diagnosis not present

## 2022-05-12 DIAGNOSIS — K5900 Constipation, unspecified: Secondary | ICD-10-CM | POA: Diagnosis not present

## 2022-05-12 DIAGNOSIS — H3530 Unspecified macular degeneration: Secondary | ICD-10-CM | POA: Diagnosis not present

## 2022-05-12 DIAGNOSIS — M069 Rheumatoid arthritis, unspecified: Secondary | ICD-10-CM | POA: Diagnosis not present

## 2022-05-12 DIAGNOSIS — R110 Nausea: Secondary | ICD-10-CM | POA: Diagnosis not present

## 2022-05-12 DIAGNOSIS — I10 Essential (primary) hypertension: Secondary | ICD-10-CM | POA: Diagnosis not present

## 2022-05-12 DIAGNOSIS — I2510 Atherosclerotic heart disease of native coronary artery without angina pectoris: Secondary | ICD-10-CM | POA: Diagnosis not present

## 2022-05-12 DIAGNOSIS — R5381 Other malaise: Secondary | ICD-10-CM | POA: Diagnosis not present

## 2022-05-12 NOTE — Telephone Encounter (Signed)
Pt daughter in law called in to cancel appt for tomorrow... Pt daughter in law stated that pt is in rehab.Marland KitchenMarland Kitchen

## 2022-05-13 ENCOUNTER — Ambulatory Visit: Payer: 59 | Admitting: *Deleted

## 2022-05-13 DIAGNOSIS — I5032 Chronic diastolic (congestive) heart failure: Secondary | ICD-10-CM

## 2022-05-13 DIAGNOSIS — I10 Essential (primary) hypertension: Secondary | ICD-10-CM

## 2022-05-13 NOTE — Chronic Care Management (AMB) (Signed)
  Care Management   Follow Up Note   05/13/2022 Name: Tanya Cole MRN: [id number] DOB: 1933-08-12   Referred by: Dale Sayville, MD Reason for referral : Case Closure   Patient discharged  from hospital to SNF.  Per notes family requesting long term placement.  Will close case at this time.  Follow Up Plan: No further follow up required: as patient at SNF.  Rhae Lerner RN, MSN RN Care Management Coordinator [facility] [phone] [email or web address]

## 2022-05-17 ENCOUNTER — Ambulatory Visit: Payer: Medicare Other

## 2022-05-19 ENCOUNTER — Ambulatory Visit (INDEPENDENT_AMBULATORY_CARE_PROVIDER_SITE_OTHER): Payer: Medicare Other | Admitting: Cardiovascular Disease

## 2022-05-19 ENCOUNTER — Encounter: Payer: Self-pay | Admitting: Cardiovascular Disease

## 2022-05-19 ENCOUNTER — Encounter: Payer: Self-pay | Admitting: Internal Medicine

## 2022-05-19 VITALS — BP 136/64 | HR 52 | Ht 61.0 in | Wt 125.2 lb

## 2022-05-19 DIAGNOSIS — I10 Essential (primary) hypertension: Secondary | ICD-10-CM | POA: Diagnosis not present

## 2022-05-19 DIAGNOSIS — I2510 Atherosclerotic heart disease of native coronary artery without angina pectoris: Secondary | ICD-10-CM | POA: Diagnosis not present

## 2022-05-19 DIAGNOSIS — E785 Hyperlipidemia, unspecified: Secondary | ICD-10-CM

## 2022-05-19 DIAGNOSIS — I4891 Unspecified atrial fibrillation: Secondary | ICD-10-CM | POA: Insufficient documentation

## 2022-05-19 DIAGNOSIS — I480 Paroxysmal atrial fibrillation: Secondary | ICD-10-CM | POA: Diagnosis not present

## 2022-05-19 MED ORDER — METOPROLOL TARTRATE 25 MG PO TABS: 25.0000 mg | ORAL_TABLET | Freq: Two times a day (BID) | ORAL | 5 refills | Status: DC

## 2022-05-19 NOTE — Patient Instructions (Signed)
Medication Instructions:  Your physician has recommended you make the following change in your medication:   REDUCE Metoprolol to 25 mg twice a day.  *If you need a refill on your cardiac medications before your next appointment, please call your pharmacy*   Lab Work: None ordered If you have labs (blood work) drawn today and your tests are completely normal, you will receive your results only by: MyChart Message (if you have MyChart) OR A paper copy in the mail If you have any lab test that is abnormal or we need to change your treatment, we will call you to review the results.   Testing/Procedures: None ordered   Follow-Up: At [facility], you and your health needs are our priority.  As part of our continuing mission to provide you with exceptional heart care, we have created designated Provider Care Teams.  These Care Teams include your primary Cardiologist (physician) and Advanced Practice Providers (APPs -  Physician Assistants and Nurse Practitioners) who all work together to provide you with the care you need, when you need it.  We recommend signing up for the patient portal called "MyChart".  Sign up information is provided on this After Visit Summary.  MyChart is used to connect with patients for Virtual Visits (Telemedicine).  Patients are able to view lab/test results, encounter notes, upcoming appointments, etc.  Non-urgent messages can be sent to your provider as well.   To learn more about what you can do with MyChart, go to [email or web address].    Your next appointment:   3 month(s)  The format for your next appointment:   In Person  Provider:   You may see Lorine Bears, MD or one of the following Advanced Practice Providers on your designated Care Team:   Nicolasa Ducking, NP Eula Listen, PA-C Cadence Fransico Michael, [place]   Other Instructions N/A  Important Information About Sugar

## 2022-05-19 NOTE — Progress Notes (Deleted)
Psychiatric Initial Adult Assessment   Patient Identification: Tanya Cole MRN:  [id number] Date of Evaluation:  05/19/2022 Referral Source: *** Chief Complaint:  No chief complaint on file.  Visit Diagnosis: No diagnosis found.  History of Present Illness:   Tanya Cole is a 88 y.o. year old female with a history of diabetes, chronic diastolic dysfunction CHF, CAD, hypoertension, PMR, RA on methotrexate, who is referred for depression.  - Per chart review, she was admitted in July 2023 for hallucinations due to unknown etiology. She was on quetiapine, which was discontinued at the discharge.          Associated Signs/Symptoms: Depression Symptoms:  {DEPRESSION SYMPTOMS:20000} (Hypo) Manic Symptoms:  {BHH MANIC SYMPTOMS:22872} Anxiety Symptoms:  {BHH ANXIETY SYMPTOMS:22873} Psychotic Symptoms:  {BHH PSYCHOTIC SYMPTOMS:22874} PTSD Symptoms: {BHH PTSD SYMPTOMS:22875}  Past Psychiatric History:  Outpatient:  Psychiatry admission:  Previous suicide attempt:  Past trials of medication:  History of violence:    Previous Psychotropic Medications: {YES/NO:21197}  Substance Abuse History in the last 12 months:  {yes no:314532}  Consequences of Substance Abuse: {BHH CONSEQUENCES OF SUBSTANCE ABUSE:22880}  Past Medical History:  Past Medical History:  Diagnosis Date   (HFpEF) heart failure with preserved ejection fraction ([place])    a. TTE 12/19: EF 55-60%, probable HK of the mid apical anterior septal myocardium, Gr1DD, mild AI, mildly dilated LA; b.07/2020 Echo: EF 60-65%, no rwma, Gr2 DD. Nl RV fxn. Mildly dil LA. Mild MR; c. 10/2021 Echo: EF 60-65%, no rwma, mild LVH, nl RV fxn, mod elev PASP, mildly dil LA, mild MR, Ao sclerosis.   Asthma    CAD (coronary artery disease)    a. 09/2018 NSTEMI/PCI: LM min irregs, mLAD 95 (PCI/DES), mLAD-2 60%, LCx mild diff dzs, RCA min irregs; b. 03/2020 MV: EF>65%, no ischemia/scar; c. 07/2020 Cath: LM min irregs, LAD 30p, 80m,  90d, D1/2 min irregs, LCX diff dzs throughout, OM1/2/3 mild dzs, RCA 30p, RPDA/RPAV min irrges. EF 55-65%.   CHF (congestive heart failure) ([place])    Diabetes mellitus ([place])    Hypercholesterolemia    Hypertension    Myocardial infarction ([place])    Osteopenia    Palpitations    a. 04/2020 Zio: Avg HR 75. 429 SVT episodes, longest 19 secs @ 133. Occas PACs (3.2%). Rare PVCs (<1%).   Polymyalgia rheumatica syndrome (HCC)    Reactive airway disease    Severe sepsis ([place])     Past Surgical History:  Procedure Laterality Date   ABDOMINAL HYSTERECTOMY  1981   prolapse and bleeding, ovaries not removed   BREAST EXCISIONAL BIOPSY Right    CHOLECYSTECTOMY N/A 09/02/2019   Procedure: LAPAROSCOPIC CHOLECYSTECTOMY WITH INTRAOPERATIVE CHOLANGIOGRAM;  Surgeon: Olean Ree, MD;  Location: [facility] ORS;  Service: General;  Laterality: N/A;   CORONARY STENT INTERVENTION N/A 10/08/2018   Procedure: CORONARY STENT INTERVENTION;  Surgeon: Wellington Hampshire, MD;  Location: [place] [facility];  Service: Cardiovascular;  Laterality: N/A;   ENDOSCOPIC RETROGRADE CHOLANGIOPANCREATOGRAPHY (ERCP) WITH PROPOFOL N/A 08/08/2019   Procedure: ENDOSCOPIC RETROGRADE CHOLANGIOPANCREATOGRAPHY (ERCP) WITH PROPOFOL;  Surgeon: Lucilla Lame, MD;  Location: [facility];  Service: Endoscopy;  Laterality: N/A;   LEFT HEART CATH AND CORONARY ANGIOGRAPHY N/A 10/08/2018   Procedure: LEFT HEART CATH AND CORONARY ANGIOGRAPHY;  Surgeon: Wellington Hampshire, MD;  Location: [place] [facility];  Service: Cardiovascular;  Laterality: N/A;   LEFT HEART CATH AND CORONARY ANGIOGRAPHY N/A 08/10/2020   Procedure: LEFT HEART CATH AND CORONARY ANGIOGRAPHY possible percutaneous intervention;  Surgeon:  Wellington Hampshire, MD;  Location: [place] [facility];  Service: Cardiovascular;  Laterality: N/A;   UMBILICAL HERNIA REPAIR  7/94    Family Psychiatric History: ***  Family History:  Family History  Problem Relation Age of Onset   Arthritis  Mother    Heart disease Mother    Heart attack Father    Throat cancer Sister    Parkinson's disease Sister    COPD Brother    COPD Brother     Social History:   Social History   Socioeconomic History   Marital status: Widowed    Spouse name: Not on file   Number of children: 3   Years of education: Not on file   Highest education level: Not on file  Occupational History   Not on file  Tobacco Use   Smoking status: Never   Smokeless tobacco: Never  Vaping Use   Vaping Use: Never used  Substance and Sexual Activity   Alcohol use: No    Alcohol/week: 0.0 standard drinks of alcohol   Drug use: No   Sexual activity: Not Currently  Other Topics Concern   Not on file  Social History Narrative   No smoking; no alcohol; in [employer]; worked in [employer] fundraiser. Lives by self in [place]. Does all of her own housework. Dtr does food shopping for her.   Social Determinants of Health   Financial Resource Strain: Low Risk  (06/04/2021)   Overall Financial Resource Strain (CARDIA)    Difficulty of Paying Living Expenses: Not hard at all  Food Insecurity: No Food Insecurity (06/04/2021)   Hunger Vital Sign    Worried About Running Out of Food in the Last Year: Never true    Ran Out of Food in the Last Year: Never true  Transportation Needs: No Transportation Needs (06/04/2021)   PRAPARE - Hydrologist (Medical): No    Lack of Transportation (Non-Medical): No  Physical Activity: Insufficiently Active (01/07/2019)   Exercise Vital Sign    Days of Exercise per Week: 2 days    Minutes of Exercise per Session: 10 min  Stress: No Stress Concern Present (04/27/2021)   [place]    Feeling of Stress : Not at all  Social Connections: Moderately Integrated (04/27/2021)   Social Connection and Isolation Panel [NHANES]    Frequency of Communication with Friends and Family: More than three times a week     Frequency of Social Gatherings with Friends and Family: More than three times a week    Attends Religious Services: More than 4 times per year    Active Member of [employer] or Organizations: Yes    Attends Archivist Meetings: Not on file    Marital Status: Widowed    Additional Social History: ***  Allergies:   Allergies  Allergen Reactions   Tramadol Itching and Nausea And Vomiting    Metabolic Disorder Labs: Lab Results  Component Value Date   HGBA1C 8.0 (H) 01/25/2022   MPG 182.9 01/25/2022   MPG 142.72 10/31/2021   No results found for: "PROLACTIN" Lab Results  Component Value Date   CHOL 112 01/26/2022   TRIG 137 01/26/2022   HDL 35 (L) 01/26/2022   CHOLHDL 3.2 01/26/2022   VLDL 27 01/26/2022   LDLCALC 50 01/26/2022   LDLCALC 108 (H) 06/27/2021   Lab Results  Component Value Date   TSH 2.064 05/07/2022    Therapeutic Level Labs: No  results found for: "LITHIUM" No results found for: "CBMZ" No results found for: "VALPROATE"  Current Medications: Current Outpatient Medications  Medication Sig Dispense Refill   acetaminophen (TYLENOL) 500 MG tablet Take 500-1,000 mg by mouth every 6 (six) hours as needed for mild pain or moderate pain.     amiodarone (PACERONE) 200 MG tablet 400mg  BID x 2 days, 200mg  BID x 1 week, 200mg  daily there after 30 tablet 0   aspirin 81 MG tablet Take 81 mg by mouth daily.     atorvastatin (LIPITOR) 80 MG tablet TAKE 1 TABLET BY MOUTH ONCE DAILY AT  6PM 90 tablet 3   fluticasone-salmeterol (ADVAIR) 250-50 MCG/ACT AEPB Inhale 1 puff into the lungs in the morning and at bedtime. 1 each 5   folic acid (FOLVITE) 1 MG tablet Take 1 mg by mouth daily.     ipratropium-albuterol (DUONEB) 0.5-2.5 (3) MG/3ML SOLN Take 3 mLs by nebulization 2 (two) times daily. (Patient taking differently: Take 3 mLs by nebulization 2 (two) times daily as needed (shortness of breath or wheezing).) 360 mL 7   LORazepam (ATIVAN) 0.5 MG tablet Take 0.5 mg by  mouth 2 (two) times daily as needed for anxiety.     metFORMIN (GLUCOPHAGE) 500 MG tablet TAKE 1 TABLET BY MOUTH TWICE DAILY WITH A MEAL 180 tablet 0   metoprolol tartrate (LOPRESSOR) 25 MG tablet Take 1 tablet (25 mg total) by mouth 2 (two) times daily. 30 tablet 5   Multiple Vitamins-Minerals (PRESERVISION AREDS 2) CAPS Take 1 capsule by mouth 2 (two) times daily.     nitroGLYCERIN (NITROSTAT) 0.4 MG SL tablet Place 1 tablet (0.4 mg total) under the tongue every 5 (five) minutes as needed for chest pain. 30 tablet 12   ondansetron (ZOFRAN-ODT) 4 MG disintegrating tablet Take by mouth as needed.     predniSONE (DELTASONE) 2.5 MG tablet Take 2.5 mg by mouth daily with breakfast.     PROAIR HFA 108 (90 Base) MCG/ACT inhaler Inhale 2 puffs into the lungs every 4 (four) hours as needed for wheezing or shortness of breath. 18 g 0   Vitamin D, Ergocalciferol, (DRISDOL) 50000 units CAPS capsule Take 50,000 Units by mouth every Friday.  3   No current facility-administered medications for this visit.    Musculoskeletal: Strength & Muscle Tone: within normal limits Gait & Station: normal Patient leans: N/A  Psychiatric Specialty Exam: Review of Systems  There were no vitals taken for this visit.There is no height or weight on file to calculate BMI.  General Appearance: {Appearance:22683}  Eye Contact:  {BHH EYE CONTACT:22684}  Speech:  Clear and Coherent  Volume:  Normal  Mood:  {BHH MOOD:22306}  Affect:  {Affect (PAA):22687}  Thought Process:  Coherent  Orientation:  Full (Time, Place, and Person)  Thought Content:  Logical  Suicidal Thoughts:  {ST/HT (PAA):22692}  Homicidal Thoughts:  {ST/HT (PAA):22692}  Memory:  Immediate;   Good  Judgement:  {Judgement (PAA):22694}  Insight:  {Insight (PAA):22695}  Psychomotor Activity:  Normal  Concentration:  Concentration: Good and Attention Span: Good  Recall:  Good  Fund of Knowledge:Good  Language: Good  Akathisia:  No  Handed:  Right   AIMS (if indicated):  not done  Assets:  Communication Skills Desire for Improvement  ADL's:  Intact  Cognition: WNL  Sleep:  {BHH GOOD/FAIR/POOR:22877}   Screenings: GAD-7    Flowsheet Row Office Visit from 06/09/2021 in [facility] Office Visit from 12/15/2020 in [facility] Office Visit from 10/16/2020 in [facility]URE [facility] Office Visit from 03/25/2020 in [facility]  Total GAD-7 Score 0 0 0 0      Mini-Mental    Flowsheet Row Office Visit from 07/23/2021 in [facility] [place] Clinical Support from 12/25/2015 in [facility]  Total Score (max 30 points ) 28 30      PHQ2-9    Flowsheet Row Office Visit from 04/15/2022 in [facility] [name] Office Visit from 02/18/2022 in [facility] Office Visit from 06/09/2021 in [facility] [place] [facility] Management from 06/04/2021 in [facility] [place] Clinical Support from 04/27/2021 in [facility] [place]  PHQ-2 Total Score 2 0 0 0 0  PHQ-9 Total Score 4 -- 0 -- --      Flowsheet Row ED to Hosp-Admission (Discharged) from 05/06/2022 in [facility] PCU ED to Hosp-Admission (Discharged) from 04/02/2022 in [facility] 1C MEDICAL TELEMETRY ED from 04/01/2022 in [facility] EMERGENCY DEPARTMENT  C-SSRS RISK CATEGORY No Risk No Risk No Risk       Assessment and Plan:  Assessment  Plan   The patient demonstrates the following risk factors for suicide: Chronic risk factors for suicide include: {Chronic Risk Factors for OIZTIWP:80998338}. Acute risk factors for suicide include: {Acute Risk Factors for SNKNLZJ:67341937}. Protective factors for this patient include: {Protective Factors for Suicide TKWI:09735329}. Considering these factors, the overall suicide risk at this point  appears to be {Desc; low/moderate/high:110033}. Patient {ACTION; IS/IS JME:26834196} appropriate for outpatient follow up.   Collaboration of Care: {BH OP Collaboration of Care:21014065}  Patient/Guardian was advised Release of Information must be obtained prior to any record release in order to collaborate their care with an outside provider. Patient/Guardian was advised if they have not already done so to contact the registration department to sign all necessary forms in order for [place] to release information regarding their care.   Consent: Patient/Guardian gives verbal consent for treatment and assignment of benefits for services provided during this visit. Patient/Guardian expressed understanding and agreed to proceed.   Neysa Hotter, MD 7/20/20235:00 PM

## 2022-05-19 NOTE — Progress Notes (Signed)
Cardiology Office Note   Date:  05/19/2022   ID:  Tanya Cole, [place] 1933/07/11, MRN [id number]  PCP:  Dale [place], MD  Cardiologist:   Lorine Bears, MD   Chief Complaint  Patient presents with   Other    AFIB/HTN/CAD c/o chest discomfort/pain. Meds reviewed verbally with pt.      History of Present Illness: Tanya Cole is a 88 y.o. female who presents for a follow-up visit regarding coronary artery disease and paroxysmal atrial fibrillation. She has known history of coronary artery disease, COPD, diabetes, hypertension and hyperlipidemia.  She had non-ST elevation myocardial infarction in December 2019.  Cardiac catheterization showed 95% mid LAD stenosis and 60% distal LAD stenosis.  The mid LAD stenosis was treated successfully with PCI and drug-eluting stent placement.   She presented in October, 2021 with a small non-STEMI.  Cardiac catheterization was performed which showed significant one-vessel coronary artery disease with patent proximal LAD stent with mild in-stent restenosis, stable 60% mid LAD stenosis and significant 90% distal LAD stenosis close to the apex.  Ejection fraction was normal and LVEDP was mildly elevated.  Distal LAD stenosis was felt to be the culprit but was not amenable to PCI given distal location.   She has recurrent chest pain in the setting of short runs of atrial tachycardia and thus the dose of metoprolol was increased. She was hospitalized in August of 2022 with pleuritic chest pain in the setting of COVID-19 infection.  Echocardiogram while hospitalized with an EF of 55 to 60% with mild to moderate mitral regurgitation.  She was hospitalized in March, 2023 with shortness of breath and wheezing.  White cell count was elevated and troponin was borderline elevated at 20 and did not trend up.  Symptoms were felt to be atypical overall.  I recommended medical therapy.  She was hospitalized in June with altered mental status change and  delirium.  She was started on Seroquel by psychiatry.  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  Prednisone was discontinued.  She was hospitalized again in July with auditory and visual hallucinations.  She was found to have mild leukocytosis.  She was in sinus rhythm on presentation but then developed A-fib with RVR.  She was placed on IV diltiazem.  She was subsequently treated with amiodarone.  She was deemed to be not a candidate for anticoagulation. She was discharged to rehab and has been doing reasonably well.  She had 1 episode of chest pain yesterday that did not last long.  She is not able to provide much details.  Hallucinations have improved.  She wants to be able to go back to her house after she finishes rehab.  Past Medical History:  Diagnosis Date   (HFpEF) heart failure with preserved ejection fraction (HCC)    a. TTE 12/19: EF 55-60%, probable HK of the mid apical anterior septal myocardium, Gr1DD, mild AI, mildly dilated LA; b.07/2020 Echo: EF 60-65%, no rwma, Gr2 DD. Nl RV fxn. Mildly dil LA. Mild MR; c. 10/2021 Echo: EF 60-65%, no rwma, mild LVH, nl RV fxn, mod elev PASP, mildly dil LA, mild MR, Ao sclerosis.   Asthma    CAD (coronary artery disease)    a. 09/2018 NSTEMI/PCI: LM min irregs, mLAD 95 (PCI/DES), mLAD-2 60%, LCx mild diff dzs, RCA min irregs; b. 03/2020 MV: EF>65%, no ischemia/scar; c. 07/2020 Cath: LM min irregs, LAD 30p, 33m, 90d, D1/2 min irregs, LCX diff dzs throughout, OM1/2/3  mild dzs, RCA 30p, RPDA/RPAV min irrges. EF 55-65%.   CHF (congestive heart failure) (HCC)    Diabetes mellitus (HCC)    Hypercholesterolemia    Hypertension    Myocardial infarction (HCC)    Osteopenia    Palpitations    a. 04/2020 Zio: Avg HR 75. 429 SVT episodes, longest 19 secs @ 133. Occas PACs (3.2%). Rare PVCs (<1%).   Polymyalgia rheumatica syndrome (HCC)    Reactive airway disease    Severe sepsis (HCC)     Past Surgical History:   Procedure Laterality Date   ABDOMINAL HYSTERECTOMY  1981   prolapse and bleeding, ovaries not removed   BREAST EXCISIONAL BIOPSY Right    CHOLECYSTECTOMY N/A 09/02/2019   Procedure: LAPAROSCOPIC CHOLECYSTECTOMY WITH INTRAOPERATIVE CHOLANGIOGRAM;  Surgeon: Henrene Dodge, MD;  Location: [facility] OR[facility];  Service: General;  Laterality: N/A;   CORONARY STENT INTERVENTION N/A 10/08/2018   Procedure: CORONARY STENT INTERVENTION;  Surgeon: Iran Ouch, MD;  Location: [facility];  Service: Cardiovascular;  Laterality: N/A;   ENDOSCOPIC RETROGRADE CHOLANGIOPANCREATOGRAPHY (ERCP) WITH PROPOFOL N/A 08/08/2019   Procedure: ENDOSCOPIC RETROGRADE CHOLANGIOPANCREATOGRAPHY (ERCP) WITH PROPOFOL;  Surgeon: Midge Minium, MD;  Location: [facility] END[facility];  Service: Endoscopy;  Laterality: N/A;   LEFT HEART CATH AND CORONARY ANGIOGRAPHY N/A 10/08/2018   Procedure: LEFT HEART CATH AND CORONARY ANGIOGRAPHY;  Surgeon: Iran Ouch, MD;  Location: [facility];  Service: Cardiovascular;  Laterality: N/A;   LEFT HEART CATH AND CORONARY ANGIOGRAPHY N/A 08/10/2020   Procedure: LEFT HEART CATH AND CORONARY ANGIOGRAPHY possible percutaneous intervention;  Surgeon: Iran Ouch, MD;  Location: [facility];  Service: Cardiovascular;  Laterality: N/A;   UMBILICAL HERNIA REPAIR  7/94     Current Outpatient Medications  Medication Sig Dispense Refill   acetaminophen (TYLENOL) 500 MG tablet Take 500-1,000 mg by mouth every 6 (six) hours as needed for mild pain or moderate pain.     amiodarone (PACERONE) 200 MG tablet  BID x 2 days,  BID x 1 week,  daily there after 30 tablet 0   aspirin 81 MG tablet Take 81 mg by mouth daily.     atorvastatin (LIPITOR) 80 MG tablet TAKE 1 TABLET BY MOUTH ONCE DAILY AT  6PM 90 tablet 3   fluticasone-salmeterol (ADVAIR) 250-50 MCG/ACT AEPB Inhale 1 puff into the lungs in the morning and at bedtime. 1 each 5   folic acid (FOLVITE) 1 MG tablet Take 1  mg by mouth daily.     ipratropium-albuterol (DUONEB) 0.5-2.5 (3) MG/3ML SOLN Take 3 mLs by nebulization 2 (two) times daily. (Patient taking differently: Take 3 mLs by nebulization 2 (two) times daily as needed (shortness of breath or wheezing).) 360 mL 7   LORazepam (ATIVAN) 0.5 MG tablet Take 0.5 mg by mouth 2 (two) times daily as needed for anxiety.     metFORMIN (GLUCOPHAGE) 500 MG tablet TAKE 1 TABLET BY MOUTH TWICE DAILY WITH A MEAL 180 tablet 0   Multiple Vitamins-Minerals (PRESERVISION AREDS 2) CAPS Take 1 capsule by mouth 2 (two) times daily.     nitroGLYCERIN (NITROSTAT) 0.4 MG SL tablet Place 1 tablet (0.4 mg total) under the tongue every 5 (five) minutes as needed for chest pain. 30 tablet 12   ondansetron (ZOFRAN-ODT) 4 MG disintegrating tablet Take by mouth as needed.     predniSONE (DELTASONE) 2.5 MG tablet Take 2.5 mg by mouth daily with breakfast.     PROAIR HFA 108 (90 Base) MCG/ACT inhaler  Inhale 2 puffs into the lungs every 4 (four) hours as needed for wheezing or shortness of breath. 18 g 0   Vitamin D, Ergocalciferol, (DRISDOL) 50000 units CAPS capsule Take 50,000 Units by mouth every Friday.  3   metoprolol tartrate (LOPRESSOR) 25 MG tablet Take 1 tablet (25 mg total) by mouth 2 (two) times daily. 30 tablet 5   No current facility-administered medications for this visit.    Allergies:   Tramadol    Social History:  The patient  reports that she has never smoked. She has never used smokeless tobacco. She reports that she does not drink alcohol and does not use drugs.   Family History:  The patient's family history includes Arthritis in her mother; COPD in her brother and brother; Heart attack in her father; Heart disease in her mother; Parkinson's disease in her sister; Throat cancer in her sister.    ROS:  Please see the history of present illness.   Otherwise, review of systems are positive for none.   All other systems are reviewed and negative.    PHYSICAL  EXAM: VS:  BP 136/64 (BP Location: Left Arm, Patient Position: Sitting, Cuff Size: Normal)   Pulse (!) 52   Ht 5\' 1"  (1.549 m)   Wt 125 lb 4 oz (56.8 kg)   SpO2 98%   BMI 23.67 kg/m  , BMI Body mass index is 23.67 kg/m. GEN: Well nourished, well developed, in no acute distress  HEENT: normal  Neck: no JVD or masses.  No carotid bruits. Cardiac: RRR; no rubs, or gallops,no edema .  1 / 6 systolic murmur in the aortic area. Respiratory:  clear to auscultation bilaterally, normal work of breathing GI: soft, nontender, nondistended, + BS MS: no deformity or atrophy  Skin: warm and dry, no rash Neuro:  Strength and sensation are intact Psych: euthymic mood, full affect Right radial pulses normal with no hematoma.  EKG:  EKG is ordered today. EKG showed sinus bradycardia with a heart rate of 52 bpm.   Recent Labs: 01/25/2022: B Natriuretic Peptide 123.5 04/05/2022: Magnesium 2.0 05/06/2022: ALT 16 05/07/2022: TSH 2.064 05/11/2022: BUN 25; Creatinine, Ser 0.75; Hemoglobin 11.7; Platelets 288; Potassium 3.9; Sodium 138    Lipid Panel    Component Value Date/Time   CHOL 112 01/26/2022 0623   TRIG 137 01/26/2022 0623   HDL 35 (L) 01/26/2022 0623   CHOLHDL 3.2 01/26/2022 0623   VLDL 27 01/26/2022 0623   LDLCALC 50 01/26/2022 0623   LDLDIRECT 136.0 07/03/2020 1139      Wt Readings from Last 3 Encounters:  05/19/22 125 lb 4 oz (56.8 kg)  05/06/22 126 lb 8.7 oz (57.4 kg)  04/15/22 126 lb 9.6 oz (57.4 kg)         No data to display            ASSESSMENT AND PLAN:  1.  Coronary artery disease involving native coronary arteries without angina: She reports 1 episode of chest pain yesterday that did not last long.  She is not able to provide much details.  EKGs with no ischemic changes.  Continue to monitor symptoms for now.  2.  Essential hypertension: Blood pressure is well controlled on current medications.  3.  Hyperlipidemia: Continue high-dose atorvastatin.  I reviewed  most recent lipid profile done in March which showed an LDL of 50.  4.  Paroxysmal atrial fibrillation: She is maintaining in sinus rhythm but she is bradycardic.  I decreased metoprolol to  25 mg twice daily.  Continue amiodarone.  I agree that she is not a good candidate for anticoagulation especially with active hallucinations.    Disposition:   FU with me in 3 months.  Signed,  Lorine Bears, MD  05/19/2022 10:26 AM    [facility]

## 2022-05-20 ENCOUNTER — Encounter: Payer: Self-pay | Admitting: Internal Medicine

## 2022-05-20 ENCOUNTER — Ambulatory Visit (INDEPENDENT_AMBULATORY_CARE_PROVIDER_SITE_OTHER): Payer: Medicare Other | Admitting: Internal Medicine

## 2022-05-20 VITALS — BP 130/76 | HR 60 | Temp 97.7°F | Resp 15 | Ht 61.0 in | Wt 125.0 lb

## 2022-05-20 DIAGNOSIS — I10 Essential (primary) hypertension: Secondary | ICD-10-CM | POA: Diagnosis not present

## 2022-05-20 DIAGNOSIS — I25118 Atherosclerotic heart disease of native coronary artery with other forms of angina pectoris: Secondary | ICD-10-CM | POA: Diagnosis not present

## 2022-05-20 DIAGNOSIS — I5032 Chronic diastolic (congestive) heart failure: Secondary | ICD-10-CM

## 2022-05-20 DIAGNOSIS — I4891 Unspecified atrial fibrillation: Secondary | ICD-10-CM

## 2022-05-20 DIAGNOSIS — E7800 Pure hypercholesterolemia, unspecified: Secondary | ICD-10-CM

## 2022-05-20 DIAGNOSIS — M353 Polymyalgia rheumatica: Secondary | ICD-10-CM

## 2022-05-20 DIAGNOSIS — E538 Deficiency of other specified B group vitamins: Secondary | ICD-10-CM

## 2022-05-20 DIAGNOSIS — R443 Hallucinations, unspecified: Secondary | ICD-10-CM

## 2022-05-20 DIAGNOSIS — D509 Iron deficiency anemia, unspecified: Secondary | ICD-10-CM | POA: Diagnosis not present

## 2022-05-20 DIAGNOSIS — E1159 Type 2 diabetes mellitus with other circulatory complications: Secondary | ICD-10-CM

## 2022-05-20 DIAGNOSIS — J449 Chronic obstructive pulmonary disease, unspecified: Secondary | ICD-10-CM | POA: Diagnosis not present

## 2022-05-20 DIAGNOSIS — Z029 Encounter for administrative examinations, unspecified: Secondary | ICD-10-CM | POA: Diagnosis not present

## 2022-05-20 DIAGNOSIS — Z758 Other problems related to medical facilities and other health care: Secondary | ICD-10-CM

## 2022-05-20 MED ORDER — CYANOCOBALAMIN 1000 MCG/ML IJ SOLN
1000.0000 ug | Freq: Once | INTRAMUSCULAR | Status: AC
  Administered 2022-05-20: 1000 ug via INTRAMUSCULAR

## 2022-05-20 NOTE — Progress Notes (Unsigned)
[name], female   DOB: 06/05/1933, 88 y.o.   MRN: [id number]   Subjective:    [name], female    DOB: 21-Jan-1933, 88 y.o.   MRN: [id number]  This visit occurred during the SARS-CoV-2 public health emergency.  Safety protocols were in place, including screening questions prior to the visit, additional usage of staff PPE, and extensive cleaning of exam room while observing appropriate contact time as indicated for disinfecting solutions.   Patient here for No chief complaint on file.  Marland Kitchen   HPI    Past Medical History:  Diagnosis Date   (HFpEF) heart failure with preserved ejection fraction (HCC)    a. TTE 12/19: EF 55-60%, probable HK of the mid apical anterior septal myocardium, Gr1DD, mild AI, mildly dilated LA; b.07/2020 Echo: EF 60-65%, no rwma, Gr2 DD. Nl RV fxn. Mildly dil LA. Mild MR; c. 10/2021 Echo: EF 60-65%, no rwma, mild LVH, nl RV fxn, mod elev PASP, mildly dil LA, mild MR, Ao sclerosis.   Asthma    CAD (coronary artery disease)    a. 09/2018 NSTEMI/PCI: LM min irregs, mLAD 95 (PCI/DES), mLAD-2 60%, LCx mild diff dzs, RCA min irregs; b. 03/2020 MV: EF>65%, no ischemia/scar; c. 07/2020 Cath: LM min irregs, LAD 30p, 63m, 90d, D1/2 min irregs, LCX diff dzs throughout, OM1/2/3 mild dzs, RCA 30p, RPDA/RPAV min irrges. EF 55-65%.   CHF (congestive heart failure) (HCC)    Diabetes mellitus (HCC)    Hypercholesterolemia    Hypertension    Myocardial infarction (HCC)    Osteopenia    Palpitations    a. 04/2020 Zio: Avg HR 75. 429 SVT episodes, longest 19 secs @ 133. Occas PACs (3.2%). Rare PVCs (<1%).   Polymyalgia rheumatica syndrome (HCC)    Reactive airway disease    Severe sepsis (HCC)    Past Surgical History:  Procedure Laterality Date   ABDOMINAL HYSTERECTOMY  1981   prolapse and bleeding, ovaries not removed   BREAST EXCISIONAL BIOPSY Right    CHOLECYSTECTOMY N/A 09/02/2019   Procedure: LAPAROSCOPIC CHOLECYSTECTOMY WITH  INTRAOPERATIVE CHOLANGIOGRAM;  Surgeon: Henrene Dodge, MD;  Location: [facility] ORS;  Service: General;  Laterality: N/A;   CORONARY STENT INTERVENTION N/A 10/08/2018   Procedure: CORONARY STENT INTERVENTION;  Surgeon: Iran Ouch, MD;  Location: [facility]IVE [facility];  Service: Cardiovascular;  Laterality: N/A;   ENDOSCOPIC RETROGRADE CHOLANGIOPANCREATOGRAPHY (ERCP) WITH PROPOFOL N/A 08/08/2019   Procedure: ENDOSCOPIC RETROGRADE CHOLANGIOPANCREATOGRAPHY (ERCP) WITH PROPOFOL;  Surgeon: Midge Minium, MD;  Location: [facility] ENDOSCOPY;  Service: Endoscopy;  Laterality: N/A;   LEFT HEART CATH AND CORONARY ANGIOGRAPHY N/A 10/08/2018   Procedure: LEFT HEART CATH AND CORONARY ANGIOGRAPHY;  Surgeon: Iran Ouch, MD;  Location: [facility];  Service: Cardiovascular;  Laterality: N/A;   LEFT HEART CATH AND CORONARY ANGIOGRAPHY N/A 08/10/2020   Procedure: LEFT HEART CATH AND CORONARY ANGIOGRAPHY possible percutaneous intervention;  Surgeon: Iran Ouch, MD;  Location: [facility];  Service: Cardiovascular;  Laterality: N/A;   UMBILICAL HERNIA REPAIR  7/94   Family History  Problem Relation Age of Onset   Arthritis Mother    Heart disease Mother    Heart attack Father    Throat cancer Sister    Parkinson's disease Sister    COPD Brother    COPD Brother    Social History   Socioeconomic History   Marital status: Widowed    Spouse name: Not on file   Number  of children: 3   Years of education: Not on file   Highest education level: Not on file  Occupational History   Not on file  Tobacco Use   Smoking status: Never   Smokeless tobacco: Never  Vaping Use   Vaping Use: Never used  Substance and Sexual Activity   Alcohol use: No    Alcohol/week: 0.0 standard drinks of alcohol   Drug use: No   Sexual activity: Not Currently  Other Topics Concern   Not on file  Social History Narrative   No smoking; no alcohol; in [place]; worked in Designer, fashion/clothing. Lives by self in  [place]. Does all of her own housework. Dtr does food shopping for her.   Social Determinants of Health   Financial Resource Strain: Low Risk  (06/04/2021)   Overall Financial Resource Strain (CARDIA)    Difficulty of Paying Living Expenses: Not hard at all  Food Insecurity: No Food Insecurity (06/04/2021)   Hunger Vital Sign    Worried About Running Out of Food in the Last Year: Never true    Ran Out of Food in the Last Year: Never true  Transportation Needs: No Transportation Needs (06/04/2021)   PRAPARE - Administrator, Civil Service (Medical): No    Lack of Transportation (Non-Medical): No  Physical Activity: Insufficiently Active (01/07/2019)   Exercise Vital Sign    Days of Exercise per Week: 2 days    Minutes of Exercise per Session: 10 min  Stress: No Stress Concern Present (04/27/2021)   [employer] of Occupational Health - Occupational Stress Questionnaire    Feeling of Stress : Not at all  Social Connections: Moderately Integrated (04/27/2021)   Social Connection and Isolation Panel [NHANES]    Frequency of Communication with Friends and Family: More than three times a week    Frequency of Social Gatherings with Friends and Family: More than three times a week    Attends Religious Services: More than 4 times per year    Active Member of [employer] or Organizations: Yes    Attends Banker Meetings: Not on file    Marital Status: Widowed     Review of Systems     Objective:     BP 130/76 (BP Location: Left Arm, Patient Position: Sitting, Cuff Size: Small)   Pulse 60   Temp 97.7 F (36.5 C) (Temporal)   Resp 15   Ht 5\' 1"  (1.549 m)   Wt 125 lb (56.7 kg)   SpO2 98%   BMI 23.62 kg/m  Wt Readings from Last 3 Encounters:  05/20/22 125 lb (56.7 kg)  05/19/22 125 lb 4 oz (56.8 kg)  05/06/22 126 lb 8.7 oz (57.4 kg)    Physical Exam   Outpatient Encounter Medications as of 05/20/2022  Medication Sig   acetaminophen (TYLENOL) 500 MG tablet  Take 500-1,000 mg by mouth every 6 (six) hours as needed for mild pain or moderate pain.   amiodarone (PACERONE) 200 MG tablet 400mg  BID x 2 days, 200mg  BID x 1 week, 200mg  daily there after   aspirin 81 MG tablet Take 81 mg by mouth daily.   atorvastatin (LIPITOR) 80 MG tablet TAKE 1 TABLET BY MOUTH ONCE DAILY AT  6PM   fluticasone-salmeterol (ADVAIR) 250-50 MCG/ACT AEPB Inhale 1 puff into the lungs in the morning and at bedtime.   folic acid (FOLVITE) 1 MG tablet Take 1 mg by mouth daily.   ipratropium-albuterol (DUONEB) 0.5-2.5 (3) MG/3ML SOLN Take 3 mLs by  nebulization 2 (two) times daily. (Patient taking differently: Take 3 mLs by nebulization 2 (two) times daily as needed (shortness of breath or wheezing).)   LORazepam (ATIVAN) 0.5 MG tablet Take 0.5 mg by mouth 2 (two) times daily as needed for anxiety.   metFORMIN (GLUCOPHAGE) 500 MG tablet TAKE 1 TABLET BY MOUTH TWICE DAILY WITH A MEAL   metoprolol tartrate (LOPRESSOR) 25 MG tablet Take 1 tablet (25 mg total) by mouth 2 (two) times daily.   Multiple Vitamins-Minerals (PRESERVISION AREDS 2) CAPS Take 1 capsule by mouth 2 (two) times daily.   nitroGLYCERIN (NITROSTAT) 0.4 MG SL tablet Place 1 tablet (0.4 mg total) under the tongue every 5 (five) minutes as needed for chest pain.   ondansetron (ZOFRAN-ODT) 4 MG disintegrating tablet Take by mouth as needed.   predniSONE (DELTASONE) 2.5 MG tablet Take 2.5 mg by mouth daily with breakfast.   PROAIR HFA 108 (90 Base) MCG/ACT inhaler Inhale 2 puffs into the lungs every 4 (four) hours as needed for wheezing or shortness of breath.   Vitamin D, Ergocalciferol, (DRISDOL) 50000 units CAPS capsule Take 50,000 Units by mouth every Friday.   No facility-administered encounter medications on file as of 05/20/2022.     Lab Results  Component Value Date   WBC 9.3 05/11/2022   HGB 11.7 (L) 05/11/2022   HCT 36.2 05/11/2022   PLT 288 05/11/2022   GLUCOSE 182 (H) 05/11/2022   CHOL 112 01/26/2022    TRIG 137 01/26/2022   HDL 35 (L) 01/26/2022   LDLDIRECT 136.0 07/03/2020   LDLCALC 50 01/26/2022   ALT 16 05/06/2022   AST 21 05/06/2022   NA 138 05/11/2022   K 3.9 05/11/2022   CL 108 05/11/2022   CREATININE 0.75 05/11/2022   BUN 25 (H) 05/11/2022   CO2 23 05/11/2022   TSH 2.064 05/07/2022   INR 1.1 01/25/2022   HGBA1C 8.0 (H) 01/25/2022   MICROALBUR 1.1 07/23/2021    DG Chest Portable 1 View  Result Date: 05/06/2022 CLINICAL DATA:  Altered mental status EXAM: PORTABLE CHEST 1 VIEW COMPARISON:  04/15/2022 chest radiograph. FINDINGS: Stable cardiomediastinal silhouette with normal heart size. No pneumothorax. No pleural effusion. No pulmonary edema. Stable calcified left lung base granuloma. Stable streaky lingular scarring. No acute consolidative airspace disease. IMPRESSION: 1. No active cardiopulmonary disease. 2. Stable streaky lingular scarring. Electronically Signed   By: Delbert Phenix M.D.   On: 05/06/2022 09:57       Assessment & Plan:   Problem List Items Addressed This Visit   None    Dale Wilkeson, MD

## 2022-05-22 ENCOUNTER — Encounter: Payer: Self-pay | Admitting: Internal Medicine

## 2022-05-22 ENCOUNTER — Telehealth: Payer: Self-pay | Admitting: Internal Medicine

## 2022-05-22 DIAGNOSIS — Z758 Other problems related to medical facilities and other health care: Secondary | ICD-10-CM | POA: Insufficient documentation

## 2022-05-22 DIAGNOSIS — Z029 Encounter for administrative examinations, unspecified: Secondary | ICD-10-CM | POA: Insufficient documentation

## 2022-05-22 NOTE — Telephone Encounter (Signed)
Needs referral placed for care coordination.  She is due to be discharged 05/22/22.  Need to know what options are available for home care, aids, PT, etc or assisted living options.

## 2022-05-22 NOTE — Assessment & Plan Note (Signed)
Has seen hematology.  Recent cbc - hgb wnl.  

## 2022-05-22 NOTE — Assessment & Plan Note (Signed)
Continue metoprolol.  Follow pressures.   

## 2022-05-22 NOTE — Assessment & Plan Note (Signed)
Lipitor.  Follow lipid panel and liver function tests.

## 2022-05-22 NOTE — Assessment & Plan Note (Signed)
ECHO = 11/01/21 - EF 60-65%.  No evidence of volume overload on exam today.  On metoprolol.  Breathing stable.  Follow.

## 2022-05-22 NOTE — Assessment & Plan Note (Signed)
Continue current inhalers.  Breathing stable.   

## 2022-05-22 NOTE — Assessment & Plan Note (Signed)
S/p stent placement.  Continue risk factor modification. Followed by cardiology.

## 2022-05-22 NOTE — Assessment & Plan Note (Signed)
On metformin.  Low carb diet.  Has been on prednisone.  Dose - 2.5mg now.  Follow met b and a1c.  

## 2022-05-22 NOTE — Assessment & Plan Note (Signed)
Visual and auditory hallucinations as outlined.  Family not aware of hallucinations in SNF. Psych evaluated.  Off seroquel.  Ativan prn.  Follow

## 2022-05-22 NOTE — Assessment & Plan Note (Addendum)
Noted during June hospitalization.  On amiodarone.  Saw Dr Kirke Corin 05/19/22 - decreased metoprolol to 25mg  bid.  Appears to be in SR.

## 2022-05-22 NOTE — Assessment & Plan Note (Signed)
Discussed plans for discharge from SNF.  Planning for discharge 05/22/22.  Discussed with family.  Discussed home health, assisted living and need for PT, supervision, etc.  Discussed will contact our care coordination team regarding available options and resources.

## 2022-05-22 NOTE — Assessment & Plan Note (Signed)
Followed by Dr Allena Katz.  On prednisone 2.5mg  q day now.  Continue f/u with rheumatology. Continue MTX.

## 2022-05-23 ENCOUNTER — Other Ambulatory Visit: Payer: Self-pay

## 2022-05-23 DIAGNOSIS — I5032 Chronic diastolic (congestive) heart failure: Secondary | ICD-10-CM

## 2022-05-23 DIAGNOSIS — R443 Hallucinations, unspecified: Secondary | ICD-10-CM

## 2022-05-23 DIAGNOSIS — I4891 Unspecified atrial fibrillation: Secondary | ICD-10-CM

## 2022-05-23 DIAGNOSIS — Z7189 Other specified counseling: Secondary | ICD-10-CM

## 2022-05-23 NOTE — Telephone Encounter (Signed)
Ref 2300 placed

## 2022-05-24 ENCOUNTER — Ambulatory Visit: Payer: Medicare Other | Admitting: Psychiatry

## 2022-05-24 ENCOUNTER — Telehealth: Payer: Self-pay | Admitting: *Deleted

## 2022-05-24 NOTE — Chronic Care Management (AMB) (Signed)
  Care Coordination  Note  05/24/2022 Name: Tanya Cole MRN: [id number] DOB: 05-03-1933  Tanya Cole is a 88 y.o. year old female who is a primary care patient of Dale Greenfield, MD. I reached out to [place] Guilbault by phone today to offer care coordination services.      Ms. Bartelson was given information about Care Coordination services today including:  The Care Coordination services include support from the care team which includes your Nurse Coordinator, Clinical Social Worker, or Pharmacist.  The Care Coordination team is here to help remove barriers to the health concerns and goals most important to you. Care Coordination services are voluntary and the patient may decline or stop services at any time by request to their care team member.   Patient agreed to services and verbal consent obtained.   Follow up plan: Telephone appointment with care coordination team member scheduled for: 05/25/2022  Burman Nieves, [facility] Care Coordination Care Guide Direct Dial: [phone]

## 2022-05-25 ENCOUNTER — Ambulatory Visit: Payer: Self-pay

## 2022-05-25 ENCOUNTER — Ambulatory Visit: Payer: Self-pay | Admitting: *Deleted

## 2022-05-25 NOTE — Patient Instructions (Signed)
Visit Information  Thank you for taking time to visit with me today. Please don't hesitate to contact me if I can be of assistance to you.   Following are the goals we discussed today:   Goals Addressed             This Visit's Progress    Patient stated: " I just want to get better"       Care Coordination Interventions: Evaluation of current treatment plan since discharge from the skilled nursing facility  and patient's adherence to plan as established by provider Advised patient caregiver to call RNCM if no contact from home health agency Reviewed medications with patient and discussed importance of compliance Reviewed scheduled/upcoming provider appointments           Our next appointment is by telephone on 06/22/22 at 10:00 am  Please call the care guide team at [phone] if you need to cancel or reschedule your appointment.   If you are experiencing a Mental Health or Behavioral Health Crisis or need someone to talk to, please call the Suicide and Crisis Lifeline: 988 call 1-800-273-TALK (toll free, 24 hour hotline)  The patient verbalized understanding of instructions, educational materials, and care plan provided today and agreed to receive a mailed copy of patient instructions, educational materials, and care plan.   George Ina RN,BSN,CCM RN Care Manager Coordinator [facility]  [phone]

## 2022-05-25 NOTE — Patient Instructions (Signed)
Visit Information  Thank you for taking time to visit with me today. Please don't hesitate to contact me if I can be of assistance to you.   Following are the goals we discussed today:   Goals Addressed               This Visit's Progress     "I would like to remain in my home" (pt-stated)        Care Coordination Interventions: Level of Care Concerns addressed with patient as well as any additional community resource needs Patient's denied need for in home care stating that her sister, son and daughter in law will be providing round the clock care-patient's sister present during call and confirms that her family will be available to rotate care for patient Patient also denied need for Assisted Living, stating that she would like to remain in her own home with the assistance from family        No follow up needed by LCSW at this time  Please call the care guide team at [phone] if you need to cancel or reschedule your appointment.   If you are experiencing a Mental Health or Behavioral Health Crisis or need someone to talk to, please call the Suicide and Crisis Lifeline: 988   Patient verbalizes understanding of instructions and care plan provided today and agrees to view in MyChart. Active MyChart status and patient understanding of how to access instructions and care plan via MyChart confirmed with patient.     No further follow up required: please contact your providers office with any additional community resource needs   Adriana Reams [facility] Care Management [phone]

## 2022-05-25 NOTE — Patient Outreach (Signed)
  Care Coordination   Initial Visit Note   05/25/2022 Name: Tanya Cole MRN: [id number] DOB: [birth year]  Tanya Cole is a 88 y.o. year old female who sees Dale [place], MD for primary care. I spoke with  [place] Salm by phone today  What matters to the patients health and wellness today?  Patient identified having no community resource or level of care needs at this time, stating that she would like to remain in her home.   Goals Addressed               This Visit's Progress     "I would like to remain in my home" (pt-stated)        Care Coordination Interventions: Level of Care Concerns addressed with patient as well as any additional community resource needs Patient's denied need for in home care stating that her sister, son and daughter in law will be providing round the clock care-patient's sister present during call and confirms that her family will be available to rotate care for patient Patient also denied need for Assisted Living, stating that she would like to remain in her own home with the assistance from family        SDOH assessments and interventions completed:   Yes   Care Coordination Interventions Activated:  No Care Coordination Interventions:  No, not indicated  Follow up plan: No further intervention required.  Encounter Outcome:  Pt. Visit Completed

## 2022-05-26 NOTE — Patient Outreach (Signed)
  Care Coordination   Initial Visit Note   05/26/2022 Late entry for 05/25/22 Name: Tanya Cole MRN: [id number] DOB: 1933-06-12  Tanya Cole Person is a 88 y.o. year old female who sees Tanya [place], MD for primary care. I spoke with  Tanya Cole and her sister, Tanya Cole by phone today.  Patient gave verbal authorization to speak with her sister Tanya Cole regarding her health information.  What matters to the patients health and wellness today?  " I'd like to get better."    Goals Addressed             This Visit's Progress    Patient stated: " I just want to get better"       Care Coordination Interventions: Evaluation of current treatment plan since discharge from the skilled nursing facility  and patient's adherence to plan as established by provider Advised patient caregiver to call RNCM if no contact from home health agency Reviewed medications with patient and discussed importance of compliance Reviewed scheduled/upcoming provider appointments           SDOH assessments and interventions completed:   Yes SDOH Interventions Today    Flowsheet Row Most Recent Value  SDOH Interventions   Food Insecurity Interventions Intervention Not Indicated  Housing Interventions Intervention Not Indicated  Transportation Interventions Intervention Not Indicated       Care Coordination Interventions Activated:  Yes Care Coordination Interventions:  Yes, provided  Follow up plan: Follow up call scheduled for June 22, 2022 at 10:00 am  Encounter Outcome:  Pt. Scheduled  Tanya Ina RN,BSN,CCM RN Care Manager Coordinator [facility]  [phone]

## 2022-05-27 ENCOUNTER — Ambulatory Visit (INDEPENDENT_AMBULATORY_CARE_PROVIDER_SITE_OTHER): Payer: Medicare Other

## 2022-05-27 VITALS — Ht 61.0 in | Wt 125.0 lb

## 2022-05-27 DIAGNOSIS — Z0000 Encounter for general adult medical examination without abnormal findings: Secondary | ICD-10-CM | POA: Diagnosis not present

## 2022-05-27 NOTE — Progress Notes (Signed)
Subjective:   Tanya Cole is a 88 y.o. female who presents for Medicare Annual (Subsequent) preventive examination.  Review of Systems    No ROS.  Medicare Wellness Virtual Visit.  Visual/audio telehealth visit, UTA vital signs.   See social history for additional risk factors.   Cardiac Risk Factors include: advanced age (>89men, >49 women)     Objective:    Today's Vitals   05/27/22 1128  Weight: 125 lb (56.7 kg)  Height:  (1.549 m)   Body mass index is 23.62 kg/m.     05/25/2022   10:20 AM 05/06/2022    9:20 AM 04/02/2022    5:00 PM 04/02/2022   11:59 AM 04/01/2022   11:24 AM 03/07/2022    9:51 AM 01/25/2022    6:00 PM  Advanced Directives  Does Patient Have a Medical Advance Directive? Yes No  No No No No  Type of Advance Directive Healthcare Power of Attorney        Does patient want to make changes to medical advance directive? No - Patient declined        Would patient like information on creating a medical advance directive?  No - Patient declined No - Patient declined  No - Patient declined No - Patient declined No - Patient declined   Current Medications (verified) Outpatient Encounter Medications as of 05/27/2022  Medication Sig   acetaminophen (TYLENOL) 500 MG tablet Take 500-1,000 mg by mouth every 6 (six) hours as needed for mild pain or moderate pain.   amiodarone (PACERONE) 200 MG tablet  BID x 2 days,  BID x 1 week,  daily there after (Patient not taking: Reported on 05/25/2022)   aspirin 81 MG tablet Take 81 mg by mouth daily.   atorvastatin (LIPITOR) 80 MG tablet TAKE 1 TABLET BY MOUTH ONCE DAILY AT  6PM   Cyanocobalamin (B-12 COMPLIANCE INJECTION IJ) Inject as directed. Once a month   fluticasone-salmeterol (ADVAIR) 250-50 MCG/ACT AEPB Inhale 1 puff into the lungs in the morning and at bedtime.   folic acid (FOLVITE) 1 MG tablet Take 1 mg by mouth daily.   ipratropium-albuterol (DUONEB) 0.5-2.5 (3) MG/3ML SOLN Take 3 mLs by  nebulization 2 (two) times daily. (Patient taking differently: Take 3 mLs by nebulization 2 (two) times daily as needed (shortness of breath or wheezing).)   LORazepam (ATIVAN) 0.5 MG tablet Take 0.5 mg by mouth 2 (two) times daily as needed for anxiety.   metFORMIN (GLUCOPHAGE) 500 MG tablet TAKE 1 TABLET BY MOUTH TWICE DAILY WITH A MEAL   metoprolol tartrate (LOPRESSOR) 25 MG tablet Take 1 tablet (25 mg total) by mouth 2 (two) times daily.   Multiple Vitamins-Minerals (PRESERVISION AREDS 2) CAPS Take 1 capsule by mouth 2 (two) times daily.   nitroGLYCERIN (NITROSTAT) 0.4 MG SL tablet Place 1 tablet (0.4 mg total) under the tongue every 5 (five) minutes as needed for chest pain.   ondansetron (ZOFRAN-ODT) 4 MG disintegrating tablet Take by mouth as needed.   predniSONE (DELTASONE) 2.5 MG tablet Take 2.5 mg by mouth daily with breakfast. (Patient not taking: Reported on 05/25/2022)   PROAIR HFA 108 (90 Base) MCG/ACT inhaler Inhale 2 puffs into the lungs every 4 (four) hours as needed for wheezing or shortness of breath.   Vitamin D, Ergocalciferol, (DRISDOL) 50000 units CAPS capsule Take 50,000 Units by mouth every Friday.   No facility-administered encounter medications on file as of 05/27/2022.   Allergies (verified) Tramadol   History: Past  Medical History:  Diagnosis Date   (HFpEF) heart failure with preserved ejection fraction (HCC)    a. TTE 12/19: EF 55-60%, probable HK of the mid apical anterior septal myocardium, Gr1DD, mild AI, mildly dilated LA; b.07/2020 Echo: EF 60-65%, no rwma, Gr2 DD. Nl RV fxn. Mildly dil LA. Mild MR; c. 10/2021 Echo: EF 60-65%, no rwma, mild LVH, nl RV fxn, mod elev PASP, mildly dil LA, mild MR, Ao sclerosis.   Asthma    CAD (coronary artery disease)    a. 09/2018 NSTEMI/PCI: LM min irregs, mLAD 95 (PCI/DES), mLAD-2 60%, LCx mild diff dzs, RCA min irregs; b. 03/2020 MV: EF>65%, no ischemia/scar; c. 07/2020 Cath: LM min irregs, LAD 30p, 55m, 90d, D1/2 min irregs,  LCX diff dzs throughout, OM1/2/3 mild dzs, RCA 30p, RPDA/RPAV min irrges. EF 55-65%.   CHF (congestive heart failure) (HCC)    Diabetes mellitus (HCC)    Hypercholesterolemia    Hypertension    Myocardial infarction (HCC)    Osteopenia    Palpitations    a. 04/2020 Zio: Avg HR 75. 429 SVT episodes, longest 19 secs @ 133. Occas PACs (3.2%). Rare PVCs (<1%).   Polymyalgia rheumatica syndrome (HCC)    Reactive airway disease    Severe sepsis [facility])    Past Surgical History:  Procedure Laterality Date   ABDOMINAL HYSTERECTOMY  11/01/1979   prolapse and bleeding, ovaries not removed   BREAST EXCISIONAL BIOPSY Right    CATARACT EXTRACTION Right    CHOLECYSTECTOMY N/A 09/02/2019   Procedure: LAPAROSCOPIC CHOLECYSTECTOMY WITH INTRAOPERATIVE CHOLANGIOGRAM;  Surgeon: Henrene Dodge, MD;  Location: [facility] ORS;  Service: General;  Laterality: N/A;   CORONARY STENT INTERVENTION N/A 10/08/2018   Procedure: CORONARY STENT INTERVENTION;  Surgeon: Iran Ouch, MD;  Location: [facility] INVASIVE CV LAB;  Service: Cardiovascular;  Laterality: N/A;   ENDOSCOPIC RETROGRADE CHOLANGIOPANCREATOGRAPHY (ERCP) WITH PROPOFOL N/A 08/08/2019   Procedure: ENDOSCOPIC RETROGRADE CHOLANGIOPANCREATOGRAPHY (ERCP) WITH PROPOFOL;  Surgeon: Midge Minium, MD;  Location: [facility] ENDOSCOPY;  Service: Endoscopy;  Laterality: N/A;   LEFT HEART CATH AND CORONARY ANGIOGRAPHY N/A 10/08/2018   Procedure: LEFT HEART CATH AND CORONARY ANGIOGRAPHY;  Surgeon: Iran Ouch, MD;  Location: [facility] INVASIVE CV LAB;  Service: Cardiovascular;  Laterality: N/A;   LEFT HEART CATH AND CORONARY ANGIOGRAPHY N/A 08/10/2020   Procedure: LEFT HEART CATH AND CORONARY ANGIOGRAPHY possible percutaneous intervention;  Surgeon: Iran Ouch, MD;  Location: [facility] INVASIVE CV LAB;  Service: Cardiovascular;  Laterality: N/A;   UMBILICAL HERNIA REPAIR  04/30/1993   Family History  Problem Relation Age of Onset   Arthritis Mother    Heart disease Mother     Heart attack Father    Throat cancer Sister    Parkinson's disease Sister    COPD Brother    COPD Brother    Social History   Socioeconomic History   Marital status: Widowed    Spouse name: Not on file   Number of children: 3   Years of education: Not on file   Highest education level: Not on file  Occupational History   Not on file  Tobacco Use   Smoking status: Never   Smokeless tobacco: Never  Vaping Use   Vaping Use: Never used  Substance and Sexual Activity   Alcohol use: No    Alcohol/week: 0.0 standard drinks of alcohol   Drug use: No   Sexual activity: Not Currently  Other Topics Concern   Not on file  Social History Narrative   No  smoking; no alcohol; in [place]; worked in Designer, fashion/clothing. Lives by self in [place]. Does all of her own housework. Dtr does food shopping for her.   Social Determinants of Health   Financial Resource Strain: Low Risk  (05/27/2022)   Overall Financial Resource Strain (CARDIA)    Difficulty of Paying Living Expenses: Not hard at all  Food Insecurity: No Food Insecurity (05/27/2022)   Hunger Vital Sign    Worried About Running Out of Food in the Last Year: Never true    Ran Out of Food in the Last Year: Never true  Transportation Needs: No Transportation Needs (05/27/2022)   PRAPARE - Administrator, Civil Service (Medical): No    Lack of Transportation (Non-Medical): No  Physical Activity: Insufficiently Active (01/07/2019)   Exercise Vital Sign    Days of Exercise per Week: 2 days    Minutes of Exercise per Session: 10 min  Stress: No Stress Concern Present (05/27/2022)   [employer] of Occupational Health - Occupational Stress Questionnaire    Feeling of Stress : Not at all  Social Connections: Moderately Isolated (05/27/2022)   Social Connection and Isolation Panel [NHANES]    Frequency of Communication with Friends and Family: More than three times a week    Frequency of Social Gatherings with Friends and Family:  More than three times a week    Attends Religious Services: More than 4 times per year    Active Member of [employer] or Organizations: No    Attends Banker Meetings: Never    Marital Status: Widowed   Tobacco Counseling Counseling given: Not Answered  Clinical Intake: Pre-visit preparation completed: Yes        Diabetes: Yes (Followed by PCP)  How often do you need to have someone help you when you read instructions, pamphlets, or other written materials from your doctor or pharmacy?: 3 - Sometimes  Interpreter Needed?: No    Activities of Daily Living    05/27/2022   11:26 AM 05/06/2022    6:00 PM  In your present state of health, do you have any difficulty performing the following activities:  Hearing? 0 0  Vision? 0 0  Difficulty concentrating or making decisions? 0 1  Walking or climbing stairs? 1 0  Comment Walker   Dressing or bathing? 0 1  Doing errands, shopping? 1   Preparing Food and eating ? N   Using the Toilet? N   In the past six months, have you accidently leaked urine? N   Do you have problems with loss of bowel control? N   Managing your Medications? Y   Comment Daughter assist   Managing your Finances? Y   Comment Daughter assist   Housekeeping or managing your Housekeeping? Y   Comment Family assist    Patient Care Team: Dale Pocahontas, MD as PCP - General (Internal Medicine) Iran Ouch, MD as PCP - Cardiology (Cardiology) [place], [place], LCSW as Social Worker Otho Ket, RN as [facility] Care Management  Indicate any recent Medical Services you may have received from other than [facility] providers in the past year (date may be approximate).     Assessment:   This is a routine wellness examination for [name].  Virtual Visit via Telephone Note  I connected with  Tanya Cole on 05/27/22 at 11:15 AM EDT by telephone and verified that I am speaking with the correct person using two  identifiers.  Location: Patient: home Provider: office  Persons participating in the virtual visit: patient/Nurse Health Advisor   I discussed the limitations of performing an evaluation and management service by telehealth. We continued and completed visit with audio only. Some vital signs may be absent or patient reported.   Hearing/Vision screen Hearing Screening - Comments:: Patient is able to hear conversational tones without difficulty. No issues reported.  Vision Screening - Comments:: Followed by [address]  Wears corrective lenses  No retinopathy reported  They have regular follow up with the ophthalmologist  Dietary issues and exercise activities discussed:   Regular diet   Goals Addressed               This Visit's Progress     Patient Stated     Increase physical activity (pt-stated)        Stay active with stretching/chair exercise        Depression Screen    05/27/2022   11:34 AM 05/25/2022   10:55 AM 04/15/2022    2:33 PM 02/18/2022    3:50 PM 06/09/2021   12:58 PM 06/04/2021    1:18 PM 04/27/2021   12:49 PM  PHQ 2/9 Scores  PHQ - 2 Score 0 0 2 0 0 0 0  PHQ- 9 Score 0  4  0      Fall Risk    05/27/2022   12:19 PM 04/15/2022    2:33 PM 02/18/2022    3:50 PM 10/22/2021   10:24 AM 06/09/2021   12:58 PM  Fall Risk   Falls in the past year?  0 0 1 0  Number falls in past yr:    0 0  Comment    FALL 10/22/21   Injury with Fall?    0 0  Risk for fall due to : Impaired balance/gait History of fall(s);Impaired balance/gait;Impaired mobility No Fall Risks History of fall(s);Medication side effect;Impaired balance/gait;Impaired mobility;Impaired vision No Fall Risks  Risk for fall due to: Comment Walker in use when ambulating      Follow up Falls evaluation completed Falls evaluation completed Falls evaluation completed Falls prevention discussed;Education provided;Falls evaluation completed Falls evaluation completed    FALL RISK  PREVENTION PERTAINING TO THE HOME: Home free of loose throw rugs in walkways, pet beds, electrical cords, etc? Yes  Adequate lighting in your home to reduce risk of falls? Yes   ASSISTIVE DEVICES UTILIZED TO PREVENT FALLS: Use of a cane, walker or w/c? No   TIMED UP AND GO: Was the test performed? No .   Cognitive Function: Patient is alert and oriented x3.     07/23/2021    4:38 PM 12/25/2015    1:16 PM  MMSE - Mini Mental State Exam  Orientation to time 5 5  Orientation to Place 5 5  Registration 2 3  Attention/ Calculation 4 5  Recall 3 3  Language- name 2 objects 2 2  Language- repeat 1 1  Language- follow 3 step command 3 3  Language- read & follow direction 1 1  Write a sentence 1 1  Copy design 1 1  Total score 28 30        05/27/2022   12:20 PM 04/27/2021    1:16 PM 01/09/2020    1:27 PM 01/07/2019    1:50 PM 01/03/2018    3:03 PM  6CIT Screen  What Year? 0 points 0 points 0 points 0 points 0 points  What month? 0 points 0 points 0 points 0 points 0 points  What time? 0 points 0 points 0 points 0 points 0 points  Count back from 20 0 points  0 points 2 points 0 points  Months in reverse 0 points 0 points 4 points 4 points 0 points  Repeat phrase   0 points  0 points  Total Score   4 points  0 points   Immunizations Immunization History  Administered Date(s) Administered   Pneumococcal Conjugate-13 07/22/2014   Pneumococcal Polysaccharide-23 01/03/2018   Screening Tests Health Maintenance  Topic Date Due   URINE MICROALBUMIN  07/23/2022   OPHTHALMOLOGY EXAM  05/27/2022 (Originally 11/16/2021)   MAMMOGRAM  07/23/2022 (Originally 01/25/2019)   Zoster Vaccines- Shingrix (1 of 2) 08/27/2022 (Originally 06/16/1952)   TETANUS/TDAP  05/21/2023 (Originally 06/16/1952)   INFLUENZA VACCINE  05/31/2022   HEMOGLOBIN A1C  07/28/2022   FOOT EXAM  11/29/2022   Pneumonia Vaccine 47+ Years old  Completed   DEXA SCAN  Completed   HPV VACCINES  Aged Out   COVID-19 Vaccine   Discontinued   Health Maintenance Health Maintenance Due  Topic Date Due   URINE MICROALBUMIN  07/23/2022   Lung Cancer Screening: (Low Dose CT Chest recommended if Age 14-80 years, 30 pack-year currently smoking OR have quit w/in 15years.) does not qualify.   Vision Screening: Recommended annual ophthalmology exams for early detection of glaucoma and other disorders of the eye.  Dental Screening: Recommended annual dental exams for proper oral hygiene  Community Resource Referral / Chronic Care Management: CRR required this visit?  No   CCM required this visit?  No      Plan:   Keep all routine maintenance appointments.   I have personally reviewed and noted the following in the patient's chart:   Medical and social history Use of alcohol, tobacco or illicit drugs  Current medications and supplements including opioid prescriptions.  Functional ability and status Nutritional status Physical activity Advanced directives List of other physicians Hospitalizations, surgeries, and ER visits in previous 12 months Vitals Screenings to include cognitive, depression, and falls Referrals and appointments  In addition, I have reviewed and discussed with patient certain preventive protocols, quality metrics, and best practice recommendations. A written personalized care plan for preventive services as well as general preventive health recommendations were provided to patient.     Ashok Pall, LPN   01/21/4009

## 2022-05-27 NOTE — Patient Instructions (Addendum)
  Tanya Cole , Thank you for taking time to come for your Medicare Wellness Visit. I appreciate your ongoing commitment to your health goals. Please review the following plan we discussed and let me know if I can assist you in the future.   These are the goals we discussed:             This Visit's Progress     Patient Stated     Increase physical activity (pt-stated)        Stay active with stretching/chair exercise       This is a list of the screening recommended for you and due dates:  Health Maintenance  Topic Date Due   Urine Protein Check  07/23/2022   Eye exam for diabetics  05/27/2022*   Mammogram  07/23/2022*   Zoster (Shingles) Vaccine (1 of 2) 08/27/2022*   Tetanus Vaccine  05/21/2023*   Flu Shot  05/31/2022   Hemoglobin A1C  07/28/2022   Complete foot exam   11/29/2022   Pneumonia Vaccine  Completed   DEXA scan (bone density measurement)  Completed   HPV Vaccine  Aged Out   COVID-19 Vaccine  Discontinued  *Topic was postponed. The date shown is not the original due date.

## 2022-05-29 ENCOUNTER — Emergency Department: Payer: Medicare Other

## 2022-05-29 ENCOUNTER — Inpatient Hospital Stay
Admission: EM | Admit: 2022-05-29 | Discharge: 2022-06-01 | DRG: 871 | Disposition: A | Discharge status: Home Health | Payer: Medicare Other | Attending: Family Medicine | Admitting: Family Medicine

## 2022-05-29 ENCOUNTER — Other Ambulatory Visit: Payer: Self-pay

## 2022-05-29 DIAGNOSIS — R509 Fever, unspecified: Secondary | ICD-10-CM | POA: Diagnosis not present

## 2022-05-29 DIAGNOSIS — Z90710 Acquired absence of both cervix and uterus: Secondary | ICD-10-CM

## 2022-05-29 DIAGNOSIS — R059 Cough, unspecified: Secondary | ICD-10-CM | POA: Diagnosis not present

## 2022-05-29 DIAGNOSIS — Z9049 Acquired absence of other specified parts of digestive tract: Secondary | ICD-10-CM

## 2022-05-29 DIAGNOSIS — J440 Chronic obstructive pulmonary disease with acute lower respiratory infection: Secondary | ICD-10-CM | POA: Diagnosis present

## 2022-05-29 DIAGNOSIS — A419 Sepsis, unspecified organism: Principal | ICD-10-CM | POA: Diagnosis present

## 2022-05-29 DIAGNOSIS — I252 Old myocardial infarction: Secondary | ICD-10-CM

## 2022-05-29 DIAGNOSIS — J45909 Unspecified asthma, uncomplicated: Secondary | ICD-10-CM | POA: Diagnosis present

## 2022-05-29 DIAGNOSIS — J4520 Mild intermittent asthma, uncomplicated: Secondary | ICD-10-CM | POA: Diagnosis not present

## 2022-05-29 DIAGNOSIS — I25118 Atherosclerotic heart disease of native coronary artery with other forms of angina pectoris: Secondary | ICD-10-CM | POA: Diagnosis not present

## 2022-05-29 DIAGNOSIS — I10 Essential (primary) hypertension: Secondary | ICD-10-CM | POA: Diagnosis not present

## 2022-05-29 DIAGNOSIS — E119 Type 2 diabetes mellitus without complications: Secondary | ICD-10-CM | POA: Diagnosis present

## 2022-05-29 DIAGNOSIS — Z885 Allergy status to narcotic agent status: Secondary | ICD-10-CM

## 2022-05-29 DIAGNOSIS — F0390 Unspecified dementia without behavioral disturbance: Secondary | ICD-10-CM | POA: Diagnosis present

## 2022-05-29 DIAGNOSIS — J449 Chronic obstructive pulmonary disease, unspecified: Secondary | ICD-10-CM | POA: Diagnosis present

## 2022-05-29 DIAGNOSIS — Z955 Presence of coronary angioplasty implant and graft: Secondary | ICD-10-CM

## 2022-05-29 DIAGNOSIS — R8290 Unspecified abnormal findings in urine: Secondary | ICD-10-CM

## 2022-05-29 DIAGNOSIS — Z66 Do not resuscitate: Secondary | ICD-10-CM | POA: Diagnosis present

## 2022-05-29 DIAGNOSIS — Z825 Family history of asthma and other chronic lower respiratory diseases: Secondary | ICD-10-CM

## 2022-05-29 DIAGNOSIS — R32 Unspecified urinary incontinence: Secondary | ICD-10-CM | POA: Diagnosis present

## 2022-05-29 DIAGNOSIS — F05 Delirium due to known physiological condition: Secondary | ICD-10-CM | POA: Diagnosis not present

## 2022-05-29 DIAGNOSIS — Z7982 Long term (current) use of aspirin: Secondary | ICD-10-CM

## 2022-05-29 DIAGNOSIS — Z8249 Family history of ischemic heart disease and other diseases of the circulatory system: Secondary | ICD-10-CM

## 2022-05-29 DIAGNOSIS — I110 Hypertensive heart disease with heart failure: Secondary | ICD-10-CM | POA: Diagnosis present

## 2022-05-29 DIAGNOSIS — E7800 Pure hypercholesterolemia, unspecified: Secondary | ICD-10-CM | POA: Diagnosis present

## 2022-05-29 DIAGNOSIS — R0602 Shortness of breath: Principal | ICD-10-CM

## 2022-05-29 DIAGNOSIS — I480 Paroxysmal atrial fibrillation: Secondary | ICD-10-CM | POA: Diagnosis present

## 2022-05-29 DIAGNOSIS — H547 Unspecified visual loss: Secondary | ICD-10-CM | POA: Diagnosis present

## 2022-05-29 DIAGNOSIS — M353 Polymyalgia rheumatica: Secondary | ICD-10-CM | POA: Diagnosis present

## 2022-05-29 DIAGNOSIS — Z20822 Contact with and (suspected) exposure to covid-19: Secondary | ICD-10-CM | POA: Diagnosis not present

## 2022-05-29 DIAGNOSIS — R079 Chest pain, unspecified: Secondary | ICD-10-CM | POA: Diagnosis not present

## 2022-05-29 DIAGNOSIS — Z7952 Long term (current) use of systemic steroids: Secondary | ICD-10-CM

## 2022-05-29 DIAGNOSIS — R531 Weakness: Secondary | ICD-10-CM | POA: Diagnosis not present

## 2022-05-29 DIAGNOSIS — J189 Pneumonia, unspecified organism: Secondary | ICD-10-CM | POA: Diagnosis present

## 2022-05-29 DIAGNOSIS — Z79899 Other long term (current) drug therapy: Secondary | ICD-10-CM

## 2022-05-29 DIAGNOSIS — R0689 Other abnormalities of breathing: Secondary | ICD-10-CM | POA: Diagnosis not present

## 2022-05-29 DIAGNOSIS — M8580 Other specified disorders of bone density and structure, unspecified site: Secondary | ICD-10-CM | POA: Diagnosis present

## 2022-05-29 DIAGNOSIS — H2512 Age-related nuclear cataract, left eye: Secondary | ICD-10-CM | POA: Diagnosis not present

## 2022-05-29 DIAGNOSIS — I5032 Chronic diastolic (congestive) heart failure: Secondary | ICD-10-CM | POA: Diagnosis present

## 2022-05-29 DIAGNOSIS — Z7984 Long term (current) use of oral hypoglycemic drugs: Secondary | ICD-10-CM

## 2022-05-29 LAB — CBC WITH DIFFERENTIAL/PLATELET
Abs Immature Granulocytes: 0.1 10*3/uL — ABNORMAL HIGH (ref 0.00–0.07)
Basophils Absolute: 0.1 10*3/uL (ref 0.0–0.1)
Basophils Relative: 0 %
Eosinophils Absolute: 0.1 10*3/uL (ref 0.0–0.5)
Eosinophils Relative: 1 %
HCT: 34.8 % — ABNORMAL LOW (ref 36.0–46.0)
Hemoglobin: 11.3 g/dL — ABNORMAL LOW (ref 12.0–15.0)
Immature Granulocytes: 1 %
Lymphocytes Relative: 11 %
Lymphs Abs: 1.9 10*3/uL (ref 0.7–4.0)
MCH: 28.8 pg (ref 26.0–34.0)
MCHC: 32.5 g/dL (ref 30.0–36.0)
MCV: 88.5 fL (ref 80.0–100.0)
Monocytes Absolute: 1.1 10*3/uL — ABNORMAL HIGH (ref 0.1–1.0)
Monocytes Relative: 6 %
Neutro Abs: 14.4 10*3/uL — ABNORMAL HIGH (ref 1.7–7.7)
Neutrophils Relative %: 81 %
Platelets: 233 10*3/uL (ref 150–400)
RBC: 3.93 MIL/uL (ref 3.87–5.11)
RDW: 14.7 % (ref 11.5–15.5)
WBC: 17.6 10*3/uL — ABNORMAL HIGH (ref 4.0–10.5)
nRBC: 0 % (ref 0.0–0.2)

## 2022-05-29 LAB — URINALYSIS, COMPLETE (UACMP) WITH MICROSCOPIC
Bacteria, UA: NONE SEEN
Bilirubin Urine: NEGATIVE
Glucose, UA: NEGATIVE mg/dL
Hgb urine dipstick: NEGATIVE
Ketones, ur: NEGATIVE mg/dL
Nitrite: POSITIVE — AB
Protein, ur: NEGATIVE mg/dL
Specific Gravity, Urine: 1.012 (ref 1.005–1.030)
pH: 6 (ref 5.0–8.0)

## 2022-05-29 LAB — COMPREHENSIVE METABOLIC PANEL
ALT: 13 U/L (ref 0–44)
AST: 16 U/L (ref 15–41)
Albumin: 3.4 g/dL — ABNORMAL LOW (ref 3.5–5.0)
Alkaline Phosphatase: 55 U/L (ref 38–126)
Anion gap: 9 (ref 5–15)
BUN: 14 mg/dL (ref 8–23)
CO2: 23 mmol/L (ref 22–32)
Calcium: 8.5 mg/dL — ABNORMAL LOW (ref 8.9–10.3)
Chloride: 105 mmol/L (ref 98–111)
Creatinine, Ser: 0.61 mg/dL (ref 0.44–1.00)
GFR, Estimated: >60 mL/min (ref >60)
Glucose, Bld: 150 mg/dL — ABNORMAL HIGH (ref 70–99)
Potassium: 3.9 mmol/L (ref 3.5–5.1)
Sodium: 137 mmol/L (ref 135–145)
Total Bilirubin: 0.8 mg/dL (ref 0.3–1.2)
Total Protein: 6.5 g/dL (ref 6.5–8.1)

## 2022-05-29 LAB — BRAIN NATRIURETIC PEPTIDE: B Natriuretic Peptide: 544.1 pg/mL — ABNORMAL HIGH (ref 0.0–100.0)

## 2022-05-29 LAB — LACTIC ACID, PLASMA
Lactic Acid, Venous: 2.4 mmol/L (ref 0.5–1.9)
Lactic Acid, Venous: 3.1 mmol/L (ref 0.5–1.9)

## 2022-05-29 LAB — PROTIME-INR
INR: 1.1 (ref 0.8–1.2)
Prothrombin Time: 14.5 seconds (ref 11.4–15.2)

## 2022-05-29 LAB — SARS CORONAVIRUS 2 BY RT PCR: SARS Coronavirus 2 by RT PCR: NEGATIVE

## 2022-05-29 LAB — APTT: aPTT: 33 seconds (ref 24–36)

## 2022-05-29 MED ORDER — ONDANSETRON 4 MG PO TBDP
4.0000 mg | ORAL_TABLET | Freq: Three times a day (TID) | ORAL | Status: DC | PRN
  Administered 2022-05-29: 4 mg via ORAL
  Filled 2022-05-29: qty 1

## 2022-05-29 MED ORDER — ACETAMINOPHEN 500 MG PO TABS
1000.0000 mg | ORAL_TABLET | Freq: Four times a day (QID) | ORAL | Status: DC | PRN
Start: 2022-05-29 — End: 2022-06-01
  Administered 2022-05-29: 1000 mg via ORAL
  Filled 2022-05-29: qty 2

## 2022-05-29 MED ORDER — METHOTREXATE 2.5 MG PO TABS: 12.5000 mg | ORAL_TABLET | ORAL | Status: DC

## 2022-05-29 MED ORDER — SODIUM CHLORIDE 0.9% FLUSH: 3.0000 mL | INTRAVENOUS | Status: DC | PRN

## 2022-05-29 MED ORDER — SODIUM CHLORIDE 0.9% FLUSH
3.0000 mL | Freq: Two times a day (BID) | INTRAVENOUS | Status: DC
Start: 2022-05-29 — End: 2022-06-01
  Administered 2022-05-29 – 2022-05-31 (×5): 3 mL via INTRAVENOUS

## 2022-05-29 MED ORDER — ATORVASTATIN CALCIUM 20 MG PO TABS
80.0000 mg | ORAL_TABLET | Freq: Every day | ORAL | Status: DC
  Administered 2022-05-29 – 2022-05-31 (×3): 80 mg via ORAL
  Filled 2022-05-29 (×3): qty 4

## 2022-05-29 MED ORDER — AMIODARONE HCL 200 MG PO TABS
200.0000 mg | ORAL_TABLET | Freq: Every day | ORAL | Status: DC
  Administered 2022-05-29 – 2022-06-01 (×4): 200 mg via ORAL
  Filled 2022-05-29 (×4): qty 1

## 2022-05-29 MED ORDER — SODIUM CHLORIDE 0.9 % IV SOLN
2.0000 g | Freq: Once | INTRAVENOUS | Status: AC
  Administered 2022-05-29: 2 g via INTRAVENOUS
  Filled 2022-05-29: qty 12.5

## 2022-05-29 MED ORDER — ACETAMINOPHEN 500 MG PO TABS
500.0000 mg | ORAL_TABLET | Freq: Four times a day (QID) | ORAL | Status: DC | PRN
Start: 2022-05-29 — End: 2022-05-29

## 2022-05-29 MED ORDER — SODIUM CHLORIDE 0.9 % IV BOLUS
500.0000 mL | Freq: Once | INTRAVENOUS | Status: AC
  Administered 2022-05-29: 500 mL via INTRAVENOUS

## 2022-05-29 MED ORDER — FLUTICASONE FUROATE-VILANTEROL 200-25 MCG/ACT IN AEPB
1.0000 | INHALATION_SPRAY | Freq: Every day | RESPIRATORY_TRACT | Status: DC
  Administered 2022-05-29 – 2022-06-01 (×4): 1 via RESPIRATORY_TRACT
  Filled 2022-05-29: qty 28

## 2022-05-29 MED ORDER — SODIUM CHLORIDE 0.9 % IV SOLN
500.0000 mg | Freq: Once | INTRAVENOUS | Status: AC
  Administered 2022-05-29: 500 mg via INTRAVENOUS
  Filled 2022-05-29: qty 5

## 2022-05-29 MED ORDER — HALOPERIDOL LACTATE 5 MG/ML IJ SOLN
2.0000 mg | Freq: Once | INTRAMUSCULAR | Status: AC
  Administered 2022-05-29: 2 mg via INTRAVENOUS
  Filled 2022-05-29: qty 1

## 2022-05-29 MED ORDER — ASPIRIN 81 MG PO TBEC
81.0000 mg | DELAYED_RELEASE_TABLET | Freq: Every day | ORAL | Status: DC
  Administered 2022-05-29 – 2022-06-01 (×4): 81 mg via ORAL
  Filled 2022-05-29 (×4): qty 1

## 2022-05-29 MED ORDER — OXYCODONE HCL 5 MG PO TABS
5.0000 mg | ORAL_TABLET | Freq: Once | ORAL | Status: AC
  Administered 2022-05-29: 5 mg via ORAL
  Filled 2022-05-29: qty 1

## 2022-05-29 MED ORDER — SODIUM CHLORIDE 0.9 % IV SOLN
2.0000 g | INTRAVENOUS | Status: DC
  Administered 2022-05-30 – 2022-05-31 (×2): 2 g via INTRAVENOUS
  Filled 2022-05-29: qty 12.5
  Filled 2022-05-29: qty 2
  Filled 2022-05-29: qty 12.5

## 2022-05-29 MED ORDER — LORAZEPAM 2 MG/ML IJ SOLN
1.0000 mg | Freq: Four times a day (QID) | INTRAMUSCULAR | Status: DC | PRN
  Administered 2022-05-30 – 2022-05-31 (×4): 1 mg via INTRAVENOUS
  Filled 2022-05-29 (×4): qty 1

## 2022-05-29 MED ORDER — METOPROLOL TARTRATE 25 MG PO TABS
25.0000 mg | ORAL_TABLET | Freq: Two times a day (BID) | ORAL | Status: DC
  Administered 2022-05-29 – 2022-06-01 (×6): 25 mg via ORAL
  Filled 2022-05-29 (×6): qty 1

## 2022-05-29 MED ORDER — SODIUM CHLORIDE 0.9 % IV SOLN
500.0000 mg | INTRAVENOUS | Status: DC
  Administered 2022-05-30 – 2022-05-31 (×2): 500 mg via INTRAVENOUS
  Filled 2022-05-29: qty 500
  Filled 2022-05-29 (×2): qty 5

## 2022-05-29 MED ORDER — FUROSEMIDE 40 MG PO TABS
40.0000 mg | ORAL_TABLET | Freq: Every day | ORAL | Status: DC
  Administered 2022-05-29 – 2022-06-01 (×4): 40 mg via ORAL
  Filled 2022-05-29 (×4): qty 1

## 2022-05-29 MED ORDER — LORAZEPAM 2 MG/ML IJ SOLN
2.0000 mg | Freq: Once | INTRAMUSCULAR | Status: AC
  Administered 2022-05-29: 2 mg via INTRAVENOUS
  Filled 2022-05-29: qty 1

## 2022-05-29 MED ORDER — ACETAMINOPHEN 500 MG PO TABS
500.0000 mg | ORAL_TABLET | Freq: Four times a day (QID) | ORAL | Status: DC | PRN
Start: 2022-05-29 — End: 2022-06-01
  Administered 2022-05-31: 500 mg via ORAL
  Filled 2022-05-29: qty 1

## 2022-05-29 MED ORDER — SODIUM CHLORIDE 0.9 % IV SOLN: 250.0000 mL | INTRAVENOUS | Status: DC | PRN

## 2022-05-29 MED ORDER — FOLIC ACID 1 MG PO TABS
1.0000 mg | ORAL_TABLET | Freq: Every day | ORAL | Status: DC
  Administered 2022-05-29 – 2022-05-31 (×3): 1 mg via ORAL
  Filled 2022-05-29 (×3): qty 1

## 2022-05-29 MED ORDER — ONDANSETRON HCL 4 MG/2ML IJ SOLN: 4.0000 mg | Freq: Four times a day (QID) | INTRAMUSCULAR | Status: DC | PRN

## 2022-05-29 MED ORDER — ENOXAPARIN SODIUM 40 MG/0.4ML IJ SOSY
40.0000 mg | PREFILLED_SYRINGE | INTRAMUSCULAR | Status: DC
Start: 2022-05-29 — End: 2022-06-01
  Administered 2022-05-29 – 2022-05-31 (×3): 40 mg via SUBCUTANEOUS
  Filled 2022-05-29 (×3): qty 0.4

## 2022-05-29 MED ORDER — IPRATROPIUM-ALBUTEROL 0.5-2.5 (3) MG/3ML IN SOLN
3.0000 mL | Freq: Four times a day (QID) | RESPIRATORY_TRACT | Status: DC
  Administered 2022-05-29 – 2022-05-30 (×3): 3 mL via RESPIRATORY_TRACT
  Filled 2022-05-29 (×4): qty 3

## 2022-05-29 NOTE — Assessment & Plan Note (Addendum)
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

## 2022-05-29 NOTE — Assessment & Plan Note (Signed)
-   continue home statin

## 2022-05-29 NOTE — Assessment & Plan Note (Signed)
BP has been stable  VS per protocol  Adjust Rx as needed

## 2022-05-29 NOTE — Assessment & Plan Note (Addendum)
Received fluids in ED for sepsis  BNP mild elevated approx 500  Strict I&O  Low dose Lasix

## 2022-05-29 NOTE — ED Notes (Signed)
Pt placed on bedside commode.   Pt not tolerate purwick.

## 2022-05-29 NOTE — ED Notes (Signed)
Unsuccessful IV/blood culture draw x2.  Will ask another RN to attempt.

## 2022-05-29 NOTE — Hospital Course (Addendum)
Tanya Cole is a 88 y.o. female who  has a past medical history of (HFpEF) heart failure with preserved ejection fraction (HCC), Asthma, CAD (coronary artery disease), CHF (congestive heart failure) (HCC), Diabetes mellitus (HCC), Hypercholesterolemia, Hypertension, Myocardial infarction (HCC), Osteopenia, Palpitations, Polymyalgia rheumatica syndrome (HCC), Reactive airway disease, and Severe sepsis (HCC). She presented to the ED via EMS from home 05/29/22 complaining of generalized weakness and SOB x1-2 days. Reports compliance w/ home COPD and cardiac medications. Hx polymyalgia rheumatica, reports was told to stop steroids, but then was restarted, last dose steroids was yesterday. 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  07/30: ER course and early admission - saturating well on RA but desats w/ minimal activity. Labs notable for leukocytosis 12.6, lactate elevated at 2.4. BNP pending. COVID negative. CXR R>L new infiltrate concerning for pneumonia w/ chronic CHF changes. Received 500 mL IVF, azithromycin, cefepime. BNP slightly up 07/31: Significantly agitated in the evening/overnight requiring benzodiazepine and antipsychotic, this morning appears lucid.  BCx NG x24h.  WBC still elevated, otherwise VSS, not meeting sepsis criteria as of today.

## 2022-05-29 NOTE — Assessment & Plan Note (Signed)
Sinus rhythm at this time   continue home amiodarone and metoprolol  Telemetry x12 hours

## 2022-05-29 NOTE — ED Notes (Signed)
Lab notified of BNP order.

## 2022-05-29 NOTE — Consult Note (Signed)
PHARMACY -  BRIEF ANTIBIOTIC NOTE   Pharmacy has received consult(s) for Cefepime from an ED provider.  The patient's profile has been reviewed for ht/wt/allergies/indication/available labs.    One time order(s) placed for Cefepime 2g IV x 1  Further antibiotics/pharmacy consults should be ordered by admitting physician if indicated.                       Thank you, Bettey Costa 05/29/2022  9:49 AM

## 2022-05-29 NOTE — Assessment & Plan Note (Signed)
See above

## 2022-05-29 NOTE — ED Triage Notes (Signed)
Per EMS, Pt, from home, c/o weakness x2 days and SOB starting this morning.  Dry cough noted.   Denies pain.  Hx of COPD and HTN.   DuoNeb and Albuterol given en route.

## 2022-05-29 NOTE — Assessment & Plan Note (Signed)
   Continue aspirin, statin, beta blocker  Not on ACE/ARB at home, would add as BP allows

## 2022-05-29 NOTE — Consult Note (Signed)
CODE SEPSIS - PHARMACY COMMUNICATION  **Broad Spectrum Antibiotics should be administered within 1 hour of Sepsis diagnosis**  Time Code Sepsis Called/Page Received: 5465  Antibiotics Ordered: cefepime, azithromycin  Time of 1st antibiotic administration: 1009  Additional action taken by pharmacy: n/a  If necessary, Name of Provider/Nurse Contacted: none    Bettey Costa ,PharmD Clinical Pharmacist  05/29/2022  10:32 AM

## 2022-05-29 NOTE — H&P (Signed)
History and Physical    Patient: Tanya Cole WUJ:[id number] DOB: [birth year] DOA: 05/29/2022 DOS: the patient was seen and examined on 05/29/2022 PCP: Dale [place]arry, MD  Patient coming from: Home via EMS.   Chief Complaint:  Chief Complaint  Patient presents with   Weakness   Shortness of Breath   HPI:  Tanya Cole is a 88 y.o. female who  has a past medical history of (HFpEF) heart failure with preserved ejection fraction (HCC), Asthma, CAD (coronary artery disease), CHF (congestive heart failure) (HCC), Diabetes mellitus (HCC), Hypercholesterolemia, Hypertension, Myocardial infarction (HCC), Osteopenia, Palpitations, Polymyalgia rheumatica syndrome (HCC), Reactive airway disease, and Severe sepsis (HCC). She presented to the ED via EMS from home 05/29/22 complaining of generalized weakness and SOB x1-2 days. Reports compliance w/ home COPD and cardiac medications. Hx polymyalgia rheumatica, reports was told to stop steroids, but then was restarted, last dose steroids was yesterday. 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  07/30: ER course - saturating well on RA but desats w/ minimal activity. Labs notable for leukocytosis 12.6, lactate elevated at 2.4. BNP pending. COVID pending. CXR R>L new infiltrate concerning for pneumonia w/ chronic CHF changes. Received 500 mL IVF, azithromycin, cefepime.    Review of Systems:  Review of Systems  Constitutional:  Positive for malaise/fatigue. Negative for chills, diaphoresis, fever and weight loss.  HENT:  Negative for sinus pain and sore throat.   Respiratory:  Positive for cough and shortness of breath. Negative for hemoptysis, sputum production and wheezing.   Cardiovascular:  Positive for leg swelling. Negative for chest pain and orthopnea.  Gastrointestinal:  Positive for nausea. Negative for abdominal pain, blood in stool,  constipation, diarrhea, heartburn and vomiting.  Genitourinary:  Negative for dysuria, frequency and urgency.  Musculoskeletal:  Negative for back pain and myalgias.  Skin:  Negative for rash.  Neurological:  Negative for dizziness and headaches.  Psychiatric/Behavioral:  Negative for depression and hallucinations. The patient is not nervous/anxious.      Past Medical History:  Diagnosis Date   (HFpEF) heart failure with preserved ejection fraction (HCC)    a. TTE 12/19: EF 55-60%, probable HK of the mid apical anterior septal myocardium, Gr1DD, mild AI, mildly dilated LA; b.07/2020 Echo: EF 60-65%, no rwma, Gr2 DD. Nl RV fxn. Mildly dil LA. Mild MR; c. 10/2021 Echo: EF 60-65%, no rwma, mild LVH, nl RV fxn, mod elev PASP, mildly dil LA, mild MR, Ao sclerosis.   Asthma    CAD (coronary artery disease)    a. 09/2018 NSTEMI/PCI: LM min irregs, mLAD 95 (PCI/DES), mLAD-2 60%, LCx mild diff dzs, RCA min irregs; b. 03/2020 MV: EF>65%, no ischemia/scar; c. 07/2020 Cath: LM min irregs, LAD 30p, 52m, 90d, D1/2 min irregs, LCX diff dzs throughout, OM1/2/3 mild dzs, RCA 30p, RPDA/RPAV min irrges. EF 55-65%.   CHF (congestive heart failure) (HCC)    Diabetes mellitus (HCC)    Hypercholesterolemia    Hypertension    Myocardial infarction (HCC)    Osteopenia    Palpitations    a. 04/2020 Zio: Avg HR 75. 429 SVT episodes, longest 19 secs @ 133. Occas PACs (3.2%). Rare PVCs (<1%).   Polymyalgia rheumatica syndrome (HCC)    Reactive airway disease    Severe sepsis [facility])    Past Surgical History:  Procedure Laterality Date   ABDOMINAL HYSTERECTOMY  11/01/1979   prolapse and bleeding, ovaries not removed   BREAST  EXCISIONAL BIOPSY Right    CATARACT EXTRACTION Right    CHOLECYSTECTOMY N/A 09/02/2019   Procedure: LAPAROSCOPIC CHOLECYSTECTOMY WITH INTRAOPERATIVE CHOLANGIOGRAM;  Surgeon: Olean Ree, MD;  Location: [facility] ORS;  Service: General;  Laterality: N/A;   CORONARY STENT INTERVENTION N/A 10/08/2018    Procedure: CORONARY STENT INTERVENTION;  Surgeon: Wellington Hampshire, MD;  Location: [place] [facility];  Service: Cardiovascular;  Laterality: N/A;   ENDOSCOPIC RETROGRADE CHOLANGIOPANCREATOGRAPHY (ERCP) WITH PROPOFOL N/A 08/08/2019   Procedure: ENDOSCOPIC RETROGRADE CHOLANGIOPANCREATOGRAPHY (ERCP) WITH PROPOFOL;  Surgeon: Lucilla Lame, MD;  Location: [facility] ENDOSCOPY;  Service: Endoscopy;  Laterality: N/A;   LEFT HEART CATH AND CORONARY ANGIOGRAPHY N/A 10/08/2018   Procedure: LEFT HEART CATH AND CORONARY ANGIOGRAPHY;  Surgeon: Wellington Hampshire, MD;  Location: [place] [facility];  Service: Cardiovascular;  Laterality: N/A;   LEFT HEART CATH AND CORONARY ANGIOGRAPHY N/A 08/10/2020   Procedure: LEFT HEART CATH AND CORONARY ANGIOGRAPHY possible percutaneous intervention;  Surgeon: Wellington Hampshire, MD;  Location: [place] [facility];  Service: Cardiovascular;  Laterality: N/A;   UMBILICAL HERNIA REPAIR  04/30/1993   Social History:  reports that she has never smoked. She has never used smokeless tobacco. She reports that she does not drink alcohol and does not use drugs.  Allergies  Allergen Reactions   Tramadol Itching and Nausea And Vomiting    Family History  Problem Relation Age of Onset   Arthritis Mother    Heart disease Mother    Heart attack Father    Throat cancer Sister    Parkinson's disease Sister    COPD Brother    COPD Brother     Prior to Admission medications   Medication Sig Start Date End Date Taking? Authorizing Provider  acetaminophen (TYLENOL) 500 MG tablet Take 500-1,000 mg by mouth every 6 (six) hours as needed for mild pain or moderate pain.    [provider]  amiodarone (PACERONE) 200 MG tablet 400mg  BID x 2 days, 200mg  BID x 1 week, 200mg  daily there after Patient not taking: Reported on 05/25/2022 05/11/22   Wyvonnia Dusky, MD  aspirin 81 MG tablet Take 81 mg by mouth daily.    [provider]  atorvastatin (LIPITOR) 80 MG tablet  TAKE 1 TABLET BY MOUTH ONCE DAILY AT  6PM 05/04/21   Loel Dubonnet, NP  Cyanocobalamin (B-12 COMPLIANCE INJECTION IJ) Inject as directed. Once a month    [provider]  fluticasone-salmeterol (ADVAIR) 250-50 MCG/ACT AEPB Inhale 1 puff into the lungs in the morning and at bedtime. 06/09/21   Einar Pheasant, MD  folic acid (FOLVITE) 1 MG tablet Take 1 mg by mouth daily. 12/20/21   [provider]  ipratropium-albuterol (DUONEB) 0.5-2.5 (3) MG/3ML SOLN Take 3 mLs by nebulization 2 (two) times daily. Patient taking differently: Take 3 mLs by nebulization 2 (two) times daily as needed (shortness of breath or wheezing). 05/17/21   Hosie Poisson, MD  LORazepam (ATIVAN) 0.5 MG tablet Take 0.5 mg by mouth 2 (two) times daily as needed for anxiety.    [provider]  metFORMIN (GLUCOPHAGE) 500 MG tablet TAKE 1 TABLET BY MOUTH TWICE DAILY WITH A MEAL 04/07/22   Einar Pheasant, MD  metoprolol tartrate (LOPRESSOR) 25 MG tablet Take 1 tablet (25 mg total) by mouth 2 (two) times daily. 05/19/22   Wellington Hampshire, MD  Multiple Vitamins-Minerals (PRESERVISION AREDS 2) CAPS Take 1 capsule by mouth 2 (two) times daily.    [provider]  nitroGLYCERIN (NITROSTAT) 0.4 MG SL tablet Place 1 tablet (0.4 mg total) under the tongue every 5 (five) minutes as needed for chest pain. 04/06/22   Emeterio Reeve, DO  ondansetron (ZOFRAN-ODT) 4 MG disintegrating tablet Take by mouth as needed. 05/12/22   [provider]  predniSONE (DELTASONE) 2.5 MG tablet Take 2.5 mg by mouth daily with breakfast. Patient not taking: Reported on 05/25/2022    [provider]  PROAIR HFA 108 (90 Base) MCG/ACT inhaler Inhale 2 puffs into the lungs every 4 (four) hours as needed for wheezing or shortness of breath. 08/20/20   Lavina Hamman, MD  Vitamin D, Ergocalciferol, (DRISDOL) 50000 units CAPS capsule Take 50,000 Units by mouth every Friday.    [provider]    Physical  Exam: Vitals:   05/29/22 1000 05/29/22 1030 05/29/22 1253 05/29/22 1421  BP: (!) 150/58 (!) 153/58  (!) 148/62  Pulse: 67 70  75  Resp: (!) 23 (!) 31  (!) 22  Temp:   100 F (37.8 C)   TempSrc:   Oral Oral  SpO2: 96% 95%  96%  Weight:      Height:       Constitutional:  VS as above General Appearance: alert, NAD Eyes: Normal lids and conjunctive, non-icteric sclera Ears, Nose, Mouth, Throat: Normal appearance Neck: No masses, trachea midline Respiratory: Normal respiratory effort Breath sounds normal, no wheeze/rhonchi/rales Cardiovascular: S1/S2 normal, no murmur/rub/gallop auscultated Trace bilateral lower extremity edema Gastrointestinal: Nontender, no masses No hernia appreciated Musculoskeletal:  No clubbing/cyanosis of digits Neurological: No cranial nerve deficit on limited exam Psychiatric: Normal judgment/insight Normal mood and affect  Data Reviewed: Results for orders placed or performed during the hospital encounter of 05/29/22 (from the past 24 hour(s))  Lactic acid, plasma     Status: Abnormal   Collection Time: 05/29/22  9:07 AM  Result Value Ref Range   Lactic Acid, Venous 2.4 (HH) 0.5 - 1.9 mmol/L  Comprehensive metabolic panel     Status: Abnormal   Collection Time: 05/29/22  9:07 AM  Result Value Ref Range   Sodium 137 135 - 145 mmol/L   Potassium 3.9 3.5 - 5.1 mmol/L   Chloride 105 98 - 111 mmol/L   CO2 23 22 - 32 mmol/L   Glucose, Bld 150 (H) 70 - 99 mg/dL   BUN 14 8 - 23 mg/dL   Creatinine, Ser 0.61 0.44 - 1.00 mg/dL   Calcium 8.5 (L) 8.9 - 10.3 mg/dL   Total Protein 6.5 6.5 - 8.1 g/dL   Albumin 3.4 (L) 3.5 - 5.0 g/dL   AST 16 15 - 41 U/L   ALT 13 0 - 44 U/L   Alkaline Phosphatase 55 38 - 126 U/L   Total Bilirubin 0.8 0.3 - 1.2 mg/dL   GFR, Estimated >60 >60 mL/min   Anion gap 9 5 - 15  CBC with Differential     Status: Abnormal   Collection Time: 05/29/22  9:07 AM  Result Value Ref Range   WBC 17.6 (H) 4.0 - 10.5 K/uL   RBC  3.93 3.87 - 5.11 MIL/uL   Hemoglobin 11.3 (L) 12.0 - 15.0 g/dL   HCT 34.8 (L) 36.0 - 46.0 %   MCV 88.5 80.0 - 100.0 fL   MCH 28.8 26.0 - 34.0 pg   MCHC 32.5 30.0 - 36.0 g/dL   RDW 14.7 11.5 - 15.5 %   Platelets 233 150 - 400 K/uL   nRBC 0.0 0.0 - 0.2 %  Neutrophils Relative % 81 %   Neutro Abs 14.4 (H) 1.7 - 7.7 K/uL   Lymphocytes Relative 11 %   Lymphs Abs 1.9 0.7 - 4.0 K/uL   Monocytes Relative 6 %   Monocytes Absolute 1.1 (H) 0.1 - 1.0 K/uL   Eosinophils Relative 1 %   Eosinophils Absolute 0.1 0.0 - 0.5 K/uL   Basophils Relative 0 %   Basophils Absolute 0.1 0.0 - 0.1 K/uL   Immature Granulocytes 1 %   Abs Immature Granulocytes 0.10 (H) 0.00 - 0.07 K/uL  Protime-INR     Status: None   Collection Time: 05/29/22  9:07 AM  Result Value Ref Range   Prothrombin Time 14.5 11.4 - 15.2 seconds   INR 1.1 0.8 - 1.2  APTT     Status: None   Collection Time: 05/29/22  9:07 AM  Result Value Ref Range   aPTT 33 24 - 36 seconds  Brain natriuretic peptide     Status: Abnormal   Collection Time: 05/29/22  9:07 AM  Result Value Ref Range   B Natriuretic Peptide 544.1 (H) 0.0 - 100.0 pg/mL  SARS Coronavirus 2 by RT PCR (hospital order, performed in [facility] hospital lab) *cepheid single result test* Anterior Nasal Swab     Status: None   Collection Time: 05/29/22  9:59 AM   Specimen: Anterior Nasal Swab  Result Value Ref Range   SARS Coronavirus 2 by RT PCR NEGATIVE NEGATIVE  Lactic acid, plasma     Status: Abnormal   Collection Time: 05/29/22 11:02 AM  Result Value Ref Range   Lactic Acid, Venous 3.1 (HH) 0.5 - 1.9 mmol/L  Urinalysis, Complete w Microscopic Urine, Clean Catch     Status: Abnormal   Collection Time: 05/29/22 11:26 AM  Result Value Ref Range   Color, Urine YELLOW (A) YELLOW   APPearance CLEAR (A) CLEAR   Specific Gravity, Urine 1.012 1.005 - 1.030   pH 6.0 5.0 - 8.0   Glucose, UA NEGATIVE NEGATIVE mg/dL   Hgb urine dipstick NEGATIVE NEGATIVE   Bilirubin Urine  NEGATIVE NEGATIVE   Ketones, ur NEGATIVE NEGATIVE mg/dL   Protein, ur NEGATIVE NEGATIVE mg/dL   Nitrite POSITIVE (A) NEGATIVE   Leukocytes,Ua SMALL (A) NEGATIVE   RBC / HPF 0-5 0 - 5 RBC/hpf   WBC, UA 11-20 0 - 5 WBC/hpf   Bacteria, UA NONE SEEN NONE SEEN   Squamous Epithelial / LPF 0-5 0 - 5   DG Chest Port 1 View  Result Date: 05/29/2022 CLINICAL DATA:  Per EMS, Pt, from home, c/o weakness x2 days and SOB starting this morning. Dry cough noted. Denies pain. Hx of COPD and HTN EXAM: PORTABLE CHEST - 1 VIEW COMPARISON:  05/06/2022 FINDINGS: New coarse interstitial infiltrates or edema throughout the right lung, and to less degree on the left predominately at the base. There is some patchy airspace opacities in the lung bases as well. Heart size and mediastinal contours are within normal limits. Aortic Atherosclerosis (ICD10-170.0). No effusion. Visualized bones unremarkable. IMPRESSION: New asymmetric interstitial infiltrates or edema, right worse than left Electronically Signed   By: Corlis Leak M.D.   On: 05/29/2022 09:25          Assessment and Plan:  * Community acquired pneumonia With hospitalization and IV abx in past 3 mos  (+)CXR findings  COPD possible exacerbation Azithromycin and Cefepime started 05/29/2022 Supplemental O2 DuoNebs Home inhaler equivalent: Breo Ellipta daily Holding steroids for now given recent problems w/  steroid induced psychosis   Hypertension BP has been stable VS per protocol Adjust Rx as needed  Paroxysmal atrial fibrillation with RVR (HCC) Sinus rhythm at this time  continue home amiodarone and metoprolol Telemetry x12 hours  COPD (chronic obstructive pulmonary disease) (HCC) See above  Chronic diastolic CHF (congestive heart failure) (HCC) Received fluids in ED for sepsis  BNP mild elevated approx 500 Strict I&O Low dose Lasix   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) Continue aspirin, statin,  beta blocker Not on ACE/ARB at home, would add as BP allows   Hypercholesterolemia continue home statin          Advance Care Planning:   Code Status: DNR confirmed at bedside w/ patient and family   Consults: none  Family Communication: daughter at bedside  Severity of Illness: The appropriate patient status for this patient is INPATIENT. Inpatient status is judged to be reasonable and necessary in order to provide the required intensity of service to ensure the patient's safety. The patient's presenting symptoms, physical exam findings, and initial radiographic and laboratory data in the context of their chronic comorbidities is felt to place them at high risk for further clinical deterioration. Furthermore, it is not anticipated that the patient will be medically stable for discharge from the hospital within 2 midnights of admission.   * I certify that at the point of admission it is my clinical judgment that the patient will require inpatient hospital care spanning beyond 2 midnights from the point of admission due to high intensity of service, high risk for further deterioration and high frequency of surveillance required.*  Author: Emeterio Reeve, DO 05/29/2022 2:51 PM  For on call review [email or web address].

## 2022-05-29 NOTE — ED Provider Notes (Addendum)
[facility] Provider Note    Event Date/Time   First MD Initiated Contact with Patient 05/29/22 0848     (approximate)   History   Weakness and Shortness of Breath   HPI  Tanya Cole is a 88 y.o. female who presents to the ER for evaluation of 2 days of worsening weakness and shortness of breath.  Has had dry cough.  Has history of COPD as well as CHF.  Does not wear home oxygen.  Found to be febrile greater than 100 with EMS tachypneic.  Not febrile on arrival to the ER.  Denies any nausea or vomiting.  No recent antibiotics.     Physical Exam   Triage Vital Signs: ED Triage Vitals  Enc Vitals Group     BP 05/29/22 0900 (!) 158/62     Pulse Rate 05/29/22 0900 75     Resp 05/29/22 0900 (!) 29     Temp 05/29/22 0857 99.2 F (37.3 C)     Temp Source 05/29/22 0857 Oral     SpO2 05/29/22 0900 96 %     Weight 05/29/22 0857 138 lb 10.7 oz (62.9 kg)     Height 05/29/22 0857 5\' 1"  (1.549 m)     Head Circumference --      Peak Flow --      Pain Score 05/29/22 0857 0     Pain Loc --      Pain Edu? --      Excl. in GC? --     Most recent vital signs: Vitals:   05/29/22 1421 05/29/22 1458  BP: (!) 148/62 (!) 151/57  Pulse: 75 77  Resp: (!) 22 18  Temp:  100.2 F (37.9 C)  SpO2: 96% 97%     Constitutional: Alert  Eyes: Conjunctivae are normal.  Head: Atraumatic. Nose: No congestion/rhinnorhea. Mouth/Throat: Mucous membranes are moist.   Neck: Painless ROM.  Cardiovascular:   Good peripheral circulation. Respiratory: Mild tachypnea with right-sided rhonchi. Gastrointestinal: Soft and nontender.  Musculoskeletal:  no deformity Neurologic:  MAE spontaneously. No gross focal neurologic deficits are appreciated.  Skin:  Skin is warm, dry and intact. No rash noted. Psychiatric: Mood and affect are normal. Speech and behavior are normal.    ED Results / Procedures / Treatments   Labs (all labs ordered are listed, but only abnormal  results are displayed) Labs Reviewed  LACTIC ACID, PLASMA - Abnormal; Notable for the following components:      Result Value   Lactic Acid, Venous 2.4 (*)    All other components within normal limits  LACTIC ACID, PLASMA - Abnormal; Notable for the following components:   Lactic Acid, Venous 3.1 (*)    All other components within normal limits  COMPREHENSIVE METABOLIC PANEL - Abnormal; Notable for the following components:   Glucose, Bld 150 (*)    Calcium 8.5 (*)    Albumin 3.4 (*)    All other components within normal limits  CBC WITH DIFFERENTIAL/PLATELET - Abnormal; Notable for the following components:   WBC 17.6 (*)    Hemoglobin 11.3 (*)    HCT 34.8 (*)    Neutro Abs 14.4 (*)    Monocytes Absolute 1.1 (*)    Abs Immature Granulocytes 0.10 (*)    All other components within normal limits  URINALYSIS, COMPLETE (UACMP) WITH MICROSCOPIC - Abnormal; Notable for the following components:   Color, Urine YELLOW (*)    APPearance CLEAR (*)    Nitrite POSITIVE (*)  Leukocytes,Ua SMALL (*)    All other components within normal limits  BRAIN NATRIURETIC PEPTIDE - Abnormal; Notable for the following components:   B Natriuretic Peptide 544.1 (*)    All other components within normal limits  SARS CORONAVIRUS 2 BY RT PCR  CULTURE, BLOOD (ROUTINE X 2)  CULTURE, BLOOD (ROUTINE X 2)  PROTIME-INR  APTT     EKG  ED ECG REPORT I, Willy Eddy, the attending physician, personally viewed and interpreted this ECG.   Date: 05/29/2022  EKG Time: 9:01  Rate: 75  Rhythm: variably conducted aflutter  Axis: normal  Intervals:normal  ST&T Change: no stemi,     RADIOLOGY Please see ED Course for my review and interpretation.  I personally reviewed all radiographic images ordered to evaluate for the above acute complaints and reviewed radiology reports and findings.  These findings were personally discussed with the patient.  Please see medical record for radiology  report.    PROCEDURES:  Critical Care performed: Yes, see critical care procedure note(s)  .Critical Care  Performed by: Willy Eddy, MD Authorized by: Willy Eddy, MD   Critical care provider statement:    Critical care time (minutes):  35   Critical care was necessary to treat or prevent imminent or life-threatening deterioration of the following conditions:  Circulatory failure   Critical care was time spent personally by me on the following activities:  Ordering and performing treatments and interventions, ordering and review of laboratory studies, ordering and review of radiographic studies, pulse oximetry, re-evaluation of patient's condition, review of old charts, obtaining history from patient or surrogate, examination of patient, evaluation of patient's response to treatment, discussions with primary provider, discussions with consultants and development of treatment plan with patient or surrogate    MEDICATIONS ORDERED IN ED: Medications  sodium chloride flush (NS) 0.9 % injection 3 mL (3 mLs Intravenous Not Given 05/29/22 1055)  sodium chloride flush (NS) 0.9 % injection 3 mL (has no administration in time range)  0.9 %  sodium chloride infusion (has no administration in time range)  enoxaparin (LOVENOX) injection 40 mg (has no administration in time range)  azithromycin (ZITHROMAX) 500 mg in sodium chloride 0.9 % 250 mL IVPB (has no administration in time range)  ceFEPIme (MAXIPIME) 2 g in sodium chloride 0.9 % 100 mL IVPB (has no administration in time range)  furosemide (LASIX) tablet 40 mg (has no administration in time range)  ceFEPIme (MAXIPIME) 2 g in sodium chloride 0.9 % 100 mL IVPB (0 g Intravenous Stopped 05/29/22 1047)  azithromycin (ZITHROMAX) 500 mg in sodium chloride 0.9 % 250 mL IVPB (0 mg Intravenous Stopped 05/29/22 1236)  sodium chloride 0.9 % bolus 500 mL (0 mLs Intravenous Stopped 05/29/22 1118)     IMPRESSION / MDM / ASSESSMENT AND PLAN / ED  COURSE  I reviewed the triage vital signs and the nursing notes.                              Differential diagnosis includes, but is not limited to, Asthma, copd, CHF, pna, ptx, malignancy, Pe, anemia  Patient presented to the ER for evaluation of symptoms as described above.  This presenting complaint could reflect a potentially life-threatening illness therefore the patient will be placed on continuous pulse oximetry and telemetry for monitoring.  Laboratory evaluation will be sent to evaluate for the above complaints.   Patient mildly tachypneic but not hypoxic while at rest.  Reportedly did become hypoxic with ambulation with EMS.  She does have a low-grade temperature.  I am concerned for pneumonia will order septic work-up.  I do not appreciate any wheezing.  We will hold off on aggressive IV resuscitation pending septic work-up but she has a history of CHF and is normotensive.    Clinical Course as of 05/29/22 1006  Sun May 29, 2022  1005 Lactate is mildly elevated at 2.4.  I have ordered process from antibiotics that she does have history of CHF frequent hospitalizations COPD.  She is normotensive therefore will give very gentle IV hydration given her history of CHF.  At this point believe she stable and appropriate for admission to the hospital.  Hospitalist consulted for admission. [PR]    Clinical Course User Index [PR] Merlyn Lot, MD      FINAL CLINICAL IMPRESSION(S) / ED DIAGNOSES   Final diagnoses:  Shortness of breath  Pneumonia of both lower lobes due to infectious organism     Rx / DC Orders   ED Discharge Orders     None        Note:  This document was prepared using Dragon voice recognition software and may include unintentional dictation errors.       Merlyn Lot, MD 05/29/22 1459

## 2022-05-30 DIAGNOSIS — R8290 Unspecified abnormal findings in urine: Secondary | ICD-10-CM

## 2022-05-30 DIAGNOSIS — J189 Pneumonia, unspecified organism: Secondary | ICD-10-CM | POA: Diagnosis not present

## 2022-05-30 DIAGNOSIS — H2512 Age-related nuclear cataract, left eye: Secondary | ICD-10-CM | POA: Diagnosis not present

## 2022-05-30 DIAGNOSIS — I5032 Chronic diastolic (congestive) heart failure: Secondary | ICD-10-CM | POA: Diagnosis not present

## 2022-05-30 DIAGNOSIS — J449 Chronic obstructive pulmonary disease, unspecified: Secondary | ICD-10-CM | POA: Diagnosis not present

## 2022-05-30 LAB — BASIC METABOLIC PANEL
Anion gap: 13 (ref 5–15)
BUN: 13 mg/dL (ref 8–23)
CO2: 27 mmol/L (ref 22–32)
Calcium: 8.6 mg/dL — ABNORMAL LOW (ref 8.9–10.3)
Chloride: 98 mmol/L (ref 98–111)
Creatinine, Ser: 0.72 mg/dL (ref 0.44–1.00)
GFR, Estimated: >60 mL/min (ref >60)
Glucose, Bld: 148 mg/dL — ABNORMAL HIGH (ref 70–99)
Potassium: 3.9 mmol/L (ref 3.5–5.1)
Sodium: 138 mmol/L (ref 135–145)

## 2022-05-30 LAB — CBC
HCT: 36.4 % (ref 36.0–46.0)
Hemoglobin: 12 g/dL (ref 12.0–15.0)
MCH: 28.2 pg (ref 26.0–34.0)
MCHC: 33 g/dL (ref 30.0–36.0)
MCV: 85.6 fL (ref 80.0–100.0)
Platelets: 281 10*3/uL (ref 150–400)
RBC: 4.25 MIL/uL (ref 3.87–5.11)
RDW: 14.6 % (ref 11.5–15.5)
WBC: 15 10*3/uL — ABNORMAL HIGH (ref 4.0–10.5)
nRBC: 0 % (ref 0.0–0.2)

## 2022-05-30 MED ORDER — QUETIAPINE FUMARATE 25 MG PO TABS
100.0000 mg | ORAL_TABLET | Freq: Every day | ORAL | Status: DC
  Administered 2022-05-30 – 2022-05-31 (×2): 100 mg via ORAL
  Filled 2022-05-30 (×2): qty 4

## 2022-05-30 MED ORDER — IPRATROPIUM-ALBUTEROL 0.5-2.5 (3) MG/3ML IN SOLN: 3.0000 mL | Freq: Four times a day (QID) | RESPIRATORY_TRACT | Status: DC | PRN

## 2022-05-30 NOTE — Progress Notes (Signed)
Mobility Specialist - Progress Note   05/30/22 1400  Mobility  Activity Ambulated with assistance in hallway  Level of Assistance Minimal assist, patient does 75% or more  Assistive Device Front wheel walker  Distance Ambulated (ft) 45 ft  Activity Response Tolerated well  $Mobility charge 1 Mobility     Pt sitting in recliner upon arrival, teary-eyed reports missing her family. Active listening and encouragement provided. Pt ambulated in hallway with minA. Short shuffled gait with several short rest breaks. Does veer L > R, requiring assistance to navigate with RW to prevent obstacle collision. Noted L-forward stance during ambulation, but does correct some with increased distance. Distance limited d/t pt voicing BLE weakness with LE notably more shaky during return distance. Chair provided and pt wheeled back to room where she returned to recliner with alarm set, needs in reach. Daughter entered mid-session and still present at exit.    Filiberto Pinks Mobility Specialist 05/30/22, 2:45 PM

## 2022-05-30 NOTE — TOC Initial Note (Signed)
Transition of Care [facility]) - Initial/Assessment Note    Patient Details  Name: Tanya Cole MRN: [id number] Date of Birth: 1933/01/13  Transition of Care [facility]) CM/SW Contact:    Chapman Fitch, RN Phone Number: 05/30/2022, 10:30 AM  Clinical Narrative:                  TOC completed assessment previous admission on 7/9.  See note from that admission below "Patient not fully oriented per chart review. Spoke to daughter Tanya Cole via phone. Patient lives alone and her children provide transportation. PCP is Dr. Lorin Picket. Pharmacy is [facility] [address] [facility] [address]. Patient has a walker, life alert button, raised toilet seat, and shower chair at home. Patient had [facility] recently. No SNF history. Family is agreeable to SNF recommendation. They are hoping to find a SNF for short term rehab and then transition patient to LTC once she finishes her rehab. Patient does have Medicaid. Tanya Cole asked about if this would be an option at [facility]. CSW started SNF work up. Reached out to [place] at [facility]. Awaiting response from [facility]/bed offers. "  Spoke with daughter who states patient was dc from [facility] 7/21 Open with [facility] for PT and ot.  Sarah with [facility] notified of admission Per daughter Tanya Cole goal would be for patient to return home.  Requested PT and OT eval from MD  Expected Discharge Plan: Home w Home Health Services Barriers to Discharge: Continued Medical Work up   Patient Goals and CMS Choice        Expected Discharge Plan and Services Expected Discharge Plan: Home w Home Health Services       Living arrangements for the past 2 months: Single Family Home                                      Prior Living Arrangements/Services Living arrangements for the past 2 months: Single Family Home                Current home services: DME    Activities of Daily Living Home Assistive Devices/Equipment: Environmental consultant (specify type) ADL  Screening (condition at time of admission) Patient's cognitive ability adequate to safely complete daily activities?: No Is the patient deaf or have difficulty hearing?: No Does the patient have difficulty seeing, even when wearing glasses/contacts?: No Does the patient have difficulty concentrating, remembering, or making decisions?: No Patient able to express need for assistance with ADLs?: Yes Does the patient have difficulty dressing or bathing?: No Independently performs ADLs?: No Communication: Independent Dressing (OT): Needs assistance Is this a change from baseline?: Pre-admission baseline Grooming: Independent Is this a change from baseline?: Pre-admission baseline Feeding: Independent Bathing: Needs assistance Is this a change from baseline?: Pre-admission baseline Toileting: Needs assistance Is this a change from baseline?: Pre-admission baseline In/Out Bed: Needs assistance Is this a change from baseline?: Pre-admission baseline Walks in Home: Needs assistance Is this a change from baseline?: Pre-admission baseline Does the patient have difficulty walking or climbing stairs?: Yes Weakness of Legs: None Weakness of Arms/Hands: None  Permission Sought/Granted                  Emotional Assessment       Orientation: : Oriented to Self      Admission diagnosis:  Shortness of breath [R06.02] Community acquired pneumonia [J18.9] Pneumonia of both lower lobes due to infectious  organism [J18.9] Patient Active Problem List   Diagnosis Date Noted   Community acquired pneumonia 05/29/2022   Discharge planning issues 05/22/2022   Atrial fibrillation (HCC) 05/19/2022   Hallucinations 05/06/2022   Steroid-induced psychosis, with hallucinations (HCC) 04/03/2022   Acute metabolic encephalopathy 04/02/2022   Paroxysmal atrial fibrillation with RVR (HCC) 04/02/2022   CAD (coronary artery disease) 01/25/2022   Polyarthritis 10/31/2021   COPD (chronic obstructive  pulmonary disease) (HCC) 05/21/2021   Hypomagnesemia 05/14/2021   Hypokalemia    Acute exacerbation of chronic obstructive pulmonary disease (COPD) (HCC) 03/01/2021   Chronic diastolic CHF (congestive heart failure) (HCC) 11/29/2020   CAP (community acquired pneumonia) 11/29/2020   Abnormal CXR 10/19/2020   Type 2 diabetes mellitus with cardiac complication (HCC) 10/08/2020   COPD with acute exacerbation (HCC) 08/19/2020   Leukocytosis 08/07/2020   Iron deficiency 04/16/2020   Anemia 04/04/2020   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 ([address]), [place] - [address] [place] ([place]) [place] [address] Phone: [phone] Fax: [phone]  [facility] [address] [place] [address] Phone: [phone] Fax: [phone]     Social Determinants of Health (SDOH) Interventions    Readmission Risk Interventions    05/30/2022   10:28 AM 05/08/2022   10:08 AM 05/14/2021    9:26 AM  Readmission Risk Prevention Plan  Transportation Screening Complete Complete Complete  PCP or Specialist Appt within 5-7 Days   Complete  Home Care Screening   Complete  Medication Review (RN CM)   Referral to Pharmacy  Medication Review (RN Care Manager) Complete Complete   PCP or Specialist appointment within 3-5 days of discharge  Complete   HRI or Home Care Consult Complete Complete   SW Recovery Care/Counseling Consult Complete Complete   Palliative Care Screening Not Applicable Not Applicable   Skilled Nursing Facility  Complete

## 2022-05-30 NOTE — Progress Notes (Addendum)
PROGRESS NOTE    Tanya Cole  VZC:[id number] DOB: 05-May-1933  DOA: 05/29/2022 Date of Service: 05/30/22 PCP: Dale [place], MD     Brief Narrative / Hospital Course:  Tanya Cole is a 88 y.o. female who  has a past medical history of (HFpEF) heart failure with preserved ejection fraction (HCC), Asthma, CAD (coronary artery disease), CHF (congestive heart failure) (HCC), Diabetes mellitus (HCC), Hypercholesterolemia, Hypertension, Myocardial infarction (HCC), Osteopenia, Palpitations, Polymyalgia rheumatica syndrome (HCC), Reactive airway disease, and Severe sepsis (HCC). She presented to the ED via EMS from home 05/29/22 complaining of generalized weakness and SOB x1-2 days. Reports compliance w/ home COPD and cardiac medications. Hx polymyalgia rheumatica, reports was told to stop steroids, but then was restarted, last dose steroids was yesterday. 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  07/30: ER course and early admission - saturating well on RA but desats w/ minimal activity. Labs notable for leukocytosis 12.6, lactate elevated at 2.4. BNP pending. COVID negative. CXR R>L new infiltrate concerning for pneumonia w/ chronic CHF changes. Received 500 mL IVF, azithromycin, cefepime. BNP slightly up 07/31: Significantly agitated in the evening/overnight requiring benzodiazepine and antipsychotic, this morning appears lucid.  BCx NG x24h.  WBC still elevated, otherwise VSS, not meeting sepsis criteria as of today.        Consultants:  none  Procedures: none    Subjective: Patient reports did not sleep well last night, does not particularly recall when she was significantly agitated/confused but by RN reports yesterday she was laying in bed screaming for some time.  Patient denies chest pain, pressure, shortness of breath.     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   Resolved by next hospital day 05/30/2022 With hospitalization and IV abx in past 3 mos  (+)CXR findings  COPD possible exacerbation but less likely, clear lung sounds  Azithromycin and Cefepime started 05/29/2022 Supplemental O2 DuoNebs Home inhaler equivalent: Breo Ellipta daily Holding steroids for now given recent problems w/ steroid induced psychosis   Hypertension BP has been stable VS per protocol Adjust Rx as needed  Hypercholesterolemia continue home statin   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) Continue aspirin, statin, beta blocker Not on ACE/ARB at home, would add as BP allows   Chronic diastolic CHF (congestive heart failure) (HCC) Received fluids in ED for sepsis  BNP mild elevated approx 500 Strict I&O Low dose Lasix   COPD (chronic obstructive pulmonary disease) (HCC) See above  Paroxysmal atrial fibrillation with RVR (HCC) Sinus rhythm at this time  Not on AC at home d/t fall risk  continue home amiodarone and metoprolol Telemetry x12 hours  Abnormal urinalysis (+)leuk, WBC 11-20, (+)nitrite UCx pending Cefepime for pneumonia should cover pending Cx    DVT prophylaxis: lovenox Code Status: DNR Family Communication: none at this time  Disposition Plan / TOC needs: PT OT to evaluate, suspect needs HH, possibly SNF Barriers to discharge / significant pending items: Pending cultures  Objective: Vitals:   05/29/22 2009  05/30/22 0107 05/30/22 0513 05/30/22 0754  BP:   (!) 150/62 138/70  Pulse: 82 63 70 69  Resp: 18 16 20 18   Temp:   98.2 F (36.8 C) 98.2 F (36.8 C)  TempSrc:   Oral   SpO2: 94% 93% 91% 93%  Weight:      Height:        Intake/Output Summary (Last 24 hours) at 05/30/2022 1259 Last data filed at 05/30/2022 0531 Gross per 24 hour  Intake --  Output 1000 ml  Net -1000 ml   Filed Weights   05/29/22 0857  Weight: 62.9 kg    Examination:  Constitutional:  VS as above General Appearance: alert, well-developed, well-nourished, NAD Eyes: Normal lids and conjunctive, non-icteric sclera Ears, Nose, Mouth, Throat: Normal appearance Neck: No masses, trachea midline Respiratory: Normal respiratory effort Breath sounds normal, no wheeze/rhonchi/rales Cardiovascular: S1/S2 normal No lower extremity edema Gastrointestinal: Nontender, no masses Musculoskeletal:  No clubbing/cyanosis of digits Neurological: No cranial nerve deficit on limited exam Psychiatric: Normal judgment/insight Normal mood and affect       Scheduled Medications:   amiodarone  200 mg Oral Daily   aspirin EC  81 mg Oral Daily   atorvastatin  80 mg Oral Daily   enoxaparin (LOVENOX) injection  40 mg Subcutaneous Q24H   fluticasone furoate-vilanterol  1 puff Inhalation Daily   folic acid  1 mg Oral Daily   furosemide  40 mg Oral Daily   ipratropium-albuterol  3 mL Nebulization Q6H   [START ON 06/04/2022] methotrexate  12.5 mg Oral Q Sat   metoprolol tartrate  25 mg Oral BID   sodium chloride flush  3 mL Intravenous Q12H    Continuous Infusions:  sodium chloride     azithromycin 500 mg (05/30/22 1052)   ceFEPime (MAXIPIME) IV 2 g (05/30/22 1053)    PRN Medications:  sodium chloride, acetaminophen **OR** acetaminophen, LORazepam, ondansetron (ZOFRAN) IV, ondansetron, sodium chloride flush  Antimicrobials:  Anti-infectives (From admission, onward)    Start     Dose/Rate Route Frequency Ordered  Stop   05/30/22 1030  azithromycin (ZITHROMAX) 500 mg in sodium chloride 0.9 % 250 mL IVPB        500 mg 250 mL/hr over 60 Minutes Intravenous Every 24 hours 05/29/22 1443     05/30/22 1000  ceFEPIme (MAXIPIME) 2 g in sodium chloride 0.9 % 100 mL IVPB        2 g 200 mL/hr over 30 Minutes Intravenous Every 24 hours 05/29/22 1443     05/29/22 0945  ceFEPIme (MAXIPIME) 2 g in sodium chloride 0.9 % 100 mL IVPB        2 g 200 mL/hr over 30 Minutes Intravenous  Once 05/29/22 0933 05/29/22 1047   05/29/22 0945  azithromycin (ZITHROMAX) 500 mg in sodium chloride 0.9 % 250 mL IVPB        500 mg 250 mL/hr over 60 Minutes Intravenous  Once 05/29/22 0933 05/29/22 1236       Data Reviewed: I have personally reviewed following labs and imaging studies  CBC: Recent Labs  Lab 05/29/22 0907 05/30/22 0645  WBC 17.6* 15.0*  NEUTROABS 14.4*  --   HGB 11.3* 12.0  HCT 34.8* 36.4  MCV 88.5 85.6  PLT 233 281   Basic Metabolic Panel: Recent Labs  Lab 05/29/22 0907 05/30/22 0645  NA 137 138  K 3.9 3.9  CL 105 98  CO2 23 27  GLUCOSE 150*  148*  BUN 14 13  CREATININE 0.61 0.72  CALCIUM 8.5* 8.6*   GFR: Estimated Creatinine Clearance: 42.9 mL/min (by C-G formula based on SCr of 0.72 mg/dL). Liver Function Tests: Recent Labs  Lab 05/29/22 0907  AST 16  ALT 13  ALKPHOS 55  BILITOT 0.8  PROT 6.5  ALBUMIN 3.4*   No results for input(s): "LIPASE", "AMYLASE" in the last 168 hours. No results for input(s): "AMMONIA" in the last 168 hours. Coagulation Profile: Recent Labs  Lab 05/29/22 0907  INR 1.1   Cardiac Enzymes: No results for input(s): "CKTOTAL", "CKMB", "CKMBINDEX", "TROPONINI" in the last 168 hours. BNP (last 3 results) No results for input(s): "PROBNP" in the last 8760 hours. HbA1C: No results for input(s): "HGBA1C" in the last 72 hours. CBG: No results for input(s): "GLUCAP" in the last 168 hours. Lipid Profile: No results for input(s): "CHOL", "HDL", "LDLCALC",  "TRIG", "CHOLHDL", "LDLDIRECT" in the last 72 hours. Thyroid Function Tests: No results for input(s): "TSH", "T4TOTAL", "FREET4", "T3FREE", "THYROIDAB" in the last 72 hours. Anemia Panel: No results for input(s): "VITAMINB12", "FOLATE", "FERRITIN", "TIBC", "IRON", "RETICCTPCT" in the last 72 hours. Urine analysis:    Component Value Date/Time   COLORURINE YELLOW (A) 05/29/2022 1126   APPEARANCEUR CLEAR (A) 05/29/2022 1126   APPEARANCEUR Clear 02/18/2013 2004   LABSPEC 1.012 05/29/2022 1126   LABSPEC 1.024 02/18/2013 2004   PHURINE 6.0 05/29/2022 1126   GLUCOSEU NEGATIVE 05/29/2022 1126   GLUCOSEU NEGATIVE 01/28/2015 0810   HGBUR NEGATIVE 05/29/2022 1126   BILIRUBINUR NEGATIVE 05/29/2022 1126   BILIRUBINUR neg 01/16/2015 1437   BILIRUBINUR Negative 02/18/2013 2004   KETONESUR NEGATIVE 05/29/2022 1126   PROTEINUR NEGATIVE 05/29/2022 1126   UROBILINOGEN 0.2 01/28/2015 0810   NITRITE POSITIVE (A) 05/29/2022 1126   LEUKOCYTESUR SMALL (A) 05/29/2022 1126   LEUKOCYTESUR Trace 02/18/2013 2004   Sepsis Labs: (procalcitonin:4,lacticidven:4)  Recent Results (from the past 240 hour(s))  Blood Culture (routine x 2)     Status: None (Preliminary result)   Collection Time: 05/29/22  9:59 AM   Specimen: BLOOD  Result Value Ref Range Status   Specimen Description BLOOD LEFT THUMB  Final   Special Requests   Final    BOTTLES DRAWN AEROBIC AND ANAEROBIC Blood Culture adequate volume   Culture   Final    NO GROWTH < 24 HOURS Performed at [facility], [address] [place], [place] [address]    Report Status PENDING  Incomplete  SARS Coronavirus 2 by RT PCR (hospital order, performed in [facility] hospital lab) *cepheid single result test* Anterior Nasal Swab     Status: None   Collection Time: 05/29/22  9:59 AM   Specimen: Anterior Nasal Swab  Result Value Ref Range Status   SARS Coronavirus 2 by RT PCR NEGATIVE NEGATIVE Final    Comment: (NOTE) SARS-CoV-2 target  nucleic acids are NOT DETECTED.  The SARS-CoV-2 RNA is generally detectable in upper and lower respiratory specimens during the acute phase of infection. The lowest concentration of SARS-CoV-2 viral copies this assay can detect is 250 copies / mL. A negative result does not preclude SARS-CoV-2 infection and should not be used as the sole basis for treatment or other patient management decisions.  A negative result may occur with improper specimen collection / handling, submission of specimen other than nasopharyngeal swab, presence of viral mutation(s) within the areas targeted by this assay, and inadequate number of viral copies (<250 copies / mL). A negative result must be combined  with clinical observations, patient history, and epidemiological information.  Fact Sheet for Patients:   RoadLapTop.co.za  Fact Sheet for Healthcare Providers: http://kim-miller.com/  This test is not yet approved or  cleared by the [place] FDA and has been authorized for detection and/or diagnosis of SARS-CoV-2 by FDA under an Emergency Use Authorization (EUA).  This EUA will remain in effect (meaning this test can be used) for the duration of the COVID-19 declaration under Section 564(b)(1) of the Act, 21 U.S.C. section 360bbb-3(b)(1), unless the authorization is terminated or revoked sooner.  Performed at [facility], [address] [place], [place] [address]   Blood Culture (routine x 2)     Status: None (Preliminary result)   Collection Time: 05/29/22 10:03 AM   Specimen: BLOOD  Result Value Ref Range Status   Specimen Description BLOOD LEFT HAND  Final   Special Requests   Final    BOTTLES DRAWN AEROBIC AND ANAEROBIC Blood Culture adequate volume   Culture   Final    NO GROWTH < 24 HOURS Performed at [facility], [address] [place], [place] [address]    Report Status PENDING  Incomplete         Radiology  Studies last 96 hours: DG Chest Port 1 View  Result Date: 05/29/2022 CLINICAL DATA:  Per EMS, Pt, from home, c/o weakness x2 days and SOB starting this morning. Dry cough noted. Denies pain. Hx of COPD and HTN EXAM: PORTABLE CHEST - 1 VIEW COMPARISON:  05/06/2022 FINDINGS: New coarse interstitial infiltrates or edema throughout the right lung, and to less degree on the left predominately at the base. There is some patchy airspace opacities in the lung bases as well. Heart size and mediastinal contours are within normal limits. Aortic Atherosclerosis (ICD10-170.0). No effusion. Visualized bones unremarkable. IMPRESSION: New asymmetric interstitial infiltrates or edema, right worse than left Electronically Signed   By: Corlis Leak M.D.   On: 05/29/2022 09:25            LOS: 1 day        Sunnie Nielsen, DO [facility] 05/30/2022, 12:59 PM   Staff may message me via secure chat in Epic  but this may not receive immediate response,  please page for urgent matters!  If 7PM-7AM, please contact night-coverage [email or web address]  Dictation software was used to generate the above note. Typos may occur and escape review, as with typed/written notes. Please contact Dr Lyn Hollingshead directly for clarity if needed.

## 2022-05-30 NOTE — Assessment & Plan Note (Signed)
(+)  leuk, WBC 11-20, (+)nitrite  UCx unable to complete see above  Cefepime for pneumonia should cover pending Cx --> consider FQ on discharge

## 2022-05-31 DIAGNOSIS — J189 Pneumonia, unspecified organism: Secondary | ICD-10-CM | POA: Diagnosis not present

## 2022-05-31 LAB — BASIC METABOLIC PANEL
Anion gap: 10 (ref 5–15)
BUN: 19 mg/dL (ref 8–23)
CO2: 29 mmol/L (ref 22–32)
Calcium: 8.7 mg/dL — ABNORMAL LOW (ref 8.9–10.3)
Chloride: 100 mmol/L (ref 98–111)
Creatinine, Ser: 0.83 mg/dL (ref 0.44–1.00)
GFR, Estimated: >60 mL/min (ref >60)
Glucose, Bld: 166 mg/dL — ABNORMAL HIGH (ref 70–99)
Potassium: 3.5 mmol/L (ref 3.5–5.1)
Sodium: 139 mmol/L (ref 135–145)

## 2022-05-31 LAB — CBC
HCT: 35 % — ABNORMAL LOW (ref 36.0–46.0)
Hemoglobin: 11.5 g/dL — ABNORMAL LOW (ref 12.0–15.0)
MCH: 28.2 pg (ref 26.0–34.0)
MCHC: 32.9 g/dL (ref 30.0–36.0)
MCV: 85.8 fL (ref 80.0–100.0)
Platelets: 270 10*3/uL (ref 150–400)
RBC: 4.08 MIL/uL (ref 3.87–5.11)
RDW: 14.6 % (ref 11.5–15.5)
WBC: 12.2 10*3/uL — ABNORMAL HIGH (ref 4.0–10.5)
nRBC: 0 % (ref 0.0–0.2)

## 2022-05-31 MED ORDER — HALOPERIDOL LACTATE 5 MG/ML IJ SOLN
2.0000 mg | Freq: Once | INTRAMUSCULAR | Status: AC
  Administered 2022-05-31: 2 mg via INTRAVENOUS
  Filled 2022-05-31: qty 1

## 2022-05-31 NOTE — Progress Notes (Signed)
Mobility Specialist - Progress Note   05/31/22 1000  Mobility  Activity Refused mobility     Per chart review and conversation with staff, pt just fell asleep after a long restless night. Will allow time to rest and attempt session another date/time.    Filiberto Pinks Mobility Specialist 05/31/22, 10:06 AM

## 2022-05-31 NOTE — Progress Notes (Signed)
       CROSS COVER NOTE  NAME: Tanya Cole MRN: [id number] DOB : Apr 07, 1933    Date of Service   05/31/2022  HPI/Events of Note   Request for sitter order received from nursing. Staff report patient has required a sitter/safety rounder today but there was no order placed. Tanya Cole has experienced sundowning and/or delirium this admission.   Interventions   Plan: Safety sitter x12 hours     This document was prepared using Dragon voice recognition software and may include unintentional dictation errors.  Bishop Limbo DNP, MHA, FNP-BC Nurse Practitioner [facility] [place] Pager [phone]

## 2022-05-31 NOTE — Progress Notes (Signed)
Mobility Specialist - Progress Note    05/31/22 1400  Mobility  Activity Transferred to/from [facility]  Level of Assistance Minimal assist, patient does 75% or more  Assistive Device Front wheel walker  Distance Ambulated (ft) 2 ft  Activity Response Tolerated well  $Mobility charge 1 Mobility    Pt is attempting to get OOB but author intercepts. Pt states she needs to use BSC. STS MinA/CGA and transfers to [facility] MinA. Pt returns to bed ModA and is left with needs in reach, bed alarm set.  Clarisa Schools Mobility Specialist 05/31/22, 2:41 PM

## 2022-05-31 NOTE — Progress Notes (Signed)
       CROSS COVER NOTE  NAME: Tanya Cole MRN: [id number] DOB : 11-14-1932    Date of Service   05/31/2022  HPI/Events of Note   Message received from nursing reporting [name] is screaming in her room despite PRN Ativan administration x2.  On arrival to unit I can hear [name] yelling out from the hallway entering the unit. At bedside she is alert and oriented to self and place (initially tells me she is on top of her house and then self corrects to [facility] without redirection). She is disoriented to time and believes its the 31s. She is able to follow commands and no focal neurological deficits noted on exam.  Interventions   Plan:  Delirium Haldol 2 mg IV x1    This document was prepared using Dragon voice recognition software and may include unintentional dictation errors.  Bishop Limbo DNP, MHA, FNP-BC Nurse Practitioner [facility] Pager [phone]

## 2022-05-31 NOTE — Evaluation (Signed)
Occupational Therapy Evaluation Patient Details Name: Tanya Cole MRN: [id number] DOB: Sep 28, 1933 Today's Date: 05/31/2022   History of Present Illness Pt is an 88 y.o. female who  has a past medical history of heart failure with preserved ejection fraction (HCC), Asthma, CAD, CHF, DM, Hypercholesterolemia, HTN, MI, Osteopenia, Palpitations, Polymyalgia rheumatica syndrome, Reactive airway disease, and Severe sepsis.  Pt arrived to ED with weakness and SOB with MD assessment including: Community acquired pneumonia, sepsis, Paroxysmal atrial fibrillation with RVR, and abnormal urinalysis.   Clinical Impression   Pt. Sitting up in the recliner with a nursing sitter in the room. Pt. presents with lethargy, weakness, limited activity tolerance,  peripheral vision limitations, a history of impaired vision, impaired cognition, and limited functional mobility which hinders her ability to complete basic ADL and IADL functioning. Pt. Resides at home alone. Pt. Reports being independent with ADLs, and IADL functioning: including meal preparation, home management, and medication management. Pt. was not driving. Pt. Reports that her daughters are supportive, and assist her at times. Pt. Was sitting up in the recliner upon arrival, having finished with PT.  Pt. Was lethargic during the session, and intermittently closing her eyes. Pt. Was assisted with repositioning multiple times in the chair. Pt. Presents with generalized UE weakness, MinA UE, and MaxA LE ADLs. Pt. Would benefit from additional assessment of vision during functional tasks when more alert, as pt. Had difficulty keeping her eyes open when attempting to assess. Per pt. Chart pt. Has a history of cataracts, as well as peripheral vision impairments. Pt. Could benefit from OT services for ADL training, A/E training, and pt./caregiver Education about visual compensatory strategies, cognitive compensatory strategies, home modification, and DME.  Pt.  Will benefit from follow-up OT services upon discharge.     Recommendations for follow up therapy are one component of a multi-disciplinary discharge planning process, led by the attending physician.  Recommendations may be updated based on patient status, additional functional criteria and insurance authorization.   Follow Up Recommendations  Skilled nursing-short term rehab (<3 hours/day)    Assistance Recommended at Discharge    Patient can return home with the following A little help with walking and/or transfers;A little help with bathing/dressing/bathroom;Assistance with cooking/housework;Direct supervision/assist for medications management    Functional Status Assessment     Equipment Recommendations       Recommendations for Other Services       Precautions / Restrictions Precautions Precautions: Fall Restrictions Weight Bearing Restrictions: No Other Position/Activity Restrictions: Heavy drift to the L with amb      Mobility Bed Mobility               General bed mobility comments: Pt. sitting up in chair    Transfers Overall transfer level: Needs assistance Equipment used: Rolling walker (2 wheels) Transfers: Sit to/from Stand Sit to Stand: Min guard      Mobility: Per PT report            Balance                                           ADL either performed or assessed with clinical judgement   ADL                        MinA UE and ModA LE  Vision Baseline Vision/History: 4 Cataracts Patient Visual Report: Peripheral vision impairment Vision Assessment?: Vision impaired- to be further tested in functional context     Perception     Praxis      Pertinent Vitals/Pain Pain Assessment Pain Assessment: No/denies pain     Hand Dominance Right   Extremity/Trunk Assessment Upper Extremity Assessment Upper Extremity Assessment: Generalized weakness Intact sensation to light  touch to bilateral hands and digits, and proprioception on the right, diminished proprioception on the left   Lower Extremity Assessment Lower Extremity Assessment: Generalized weakness       Communication Communication Communication: No difficulties   Cognition Arousal/Alertness: Lethargic Behavior During Therapy: WFL for tasks assessed/performed Overall Cognitive Status: No family/caregiver present to determine baseline cognitive functioning                                       General Comments       Exercises     Shoulder Instructions      Home Living Family/patient expects to be discharged to:: Private residence Living Arrangements: Alone Available Help at Discharge: Available PRN/intermittently;Family Type of Home: House Home Access: Stairs to enter Secretary/administrator of Steps: 2 Entrance Stairs-Rails: None Home Layout: One level     Bathroom Shower/Tub: Tub/shower unit;Sponge bathes at baseline   [employer]: Standard     Home Equipment: Standard Walker;BSC/3in1;Cane - single point;Grab bars - tub/shower   Additional Comments: home set up and PLOF taken from prior admission as well as from patient; patient not a reliable historian      Prior Functioning/Environment Prior Level of Function : Patient poor historian/Family not available             Mobility Comments: Mod Ind amb with a RW, no fall history per patient ADLs Comments: Pt. reports independence with all ADLs, and IADLs with supportive daughters assist as needed. Pt. does not drive.        OT Problem List: Decreased activity tolerance;Decreased coordination;Decreased knowledge of use of DME or AE;Impaired vision/perception;Decreased strength;Impaired balance (sitting and/or standing);Decreased cognition      OT Treatment/Interventions: Self-care/ADL training;Therapeutic exercise;Patient/family education;Balance training;Energy conservation;Therapeutic activities;DME  and/or AE instruction;Cognitive remediation/compensation    OT Goals(Current goals can be found in the care plan section) Acute Rehab OT Goals Patient Stated Goal: To get better OT Goal Formulation: With patient Time For Goal Achievement: 05/22/22 Potential to Achieve Goals: Good  OT Frequency: Min 2X/week    Co-evaluation              AM-PAC OT "6 Clicks" Daily Activity     Outcome Measure Help from another person eating meals?: None Help from another person taking care of personal grooming?: A Little Help from another person toileting, which includes using toliet, bedpan, or urinal?: A Little Help from another person bathing (including washing, rinsing, drying)?: A Lot Help from another person to put on and taking off regular upper body clothing?: A Little Help from another person to put on and taking off regular lower body clothing?: A Little 6 Click Score: 18   End of Session    Activity Tolerance: Patient tolerated treatment well Patient left: in chair;with call bell/phone within reach;with nursing/sitter in room  OT Visit Diagnosis: Unsteadiness on feet (R26.81);Muscle weakness (generalized) (M62.81);Other symptoms and signs involving cognitive function                Time: [phone]  OT Time Calculation (min): 22 min Charges:  OT General Charges $OT Visit: 1 Visit OT Evaluation $OT Eval Low Complexity: 1 Low Olegario Messier, MS, OTR/L   Olegario Messier 05/31/2022, 4:27 PM

## 2022-05-31 NOTE — Progress Notes (Signed)
OT Cancellation Note  Patient Details Name: Harlowe Dowler MRN: [id number] DOB: 03/16/1933   Cancelled Treatment:    Reason Eval/Treat Not Completed: Other (comment) (Nursing request to Hold therapy, and let pt. sleep 2/2 to having a restless night. Will attempt the OT initial Eval at a later time/date.)  Olegario Messier, MS, OTR/L 05/31/2022, 11:13 AM

## 2022-05-31 NOTE — Evaluation (Signed)
Physical Therapy Evaluation Patient Details Name: Tanya Cole MRN: [id number] DOB: 07/05/1933 Today's Date: 05/31/2022  History of Present Illness  Pt is an 88 y.o. female who  has a past medical history of heart failure with preserved ejection fraction (HCC), Asthma, CAD, CHF, DM, Hypercholesterolemia, HTN, MI, Osteopenia, Palpitations, Polymyalgia rheumatica syndrome, Reactive airway disease, and Severe sepsis.  Pt arrived to ED with weakness and SOB with MD assessment including: Community acquired pneumonia, sepsis, Paroxysmal atrial fibrillation with RVR, and abnormal urinalysis.   Clinical Impression  Pt lethargic but followed commands and put forth fair effort during the session.  Pt required minimal assistance with bed mobility and close CGA with transfers but demonstrated no LOB upon coming to initial stand. During gait pt consistently struck obstacles on her left even with heavy cuing to scan her environment.  Once striking an obstacle such as a wall the pt struggled to correct her position and required physical assistance to correct her course.  Visual assessment after ambulation was somewhat limited by pt's lethargy but also seemed to include poor vision/peripheral vision on the left, MD notified.  Pt is at a very high risk for falls and would not be safe to return to her prior living situation at this time.  Pt will benefit from PT services in a SNF setting upon discharge to safely address deficits listed in patient problem list for decreased caregiver assistance and eventual return to PLOF.        Recommendations for follow up therapy are one component of a multi-disciplinary discharge planning process, led by the attending physician.  Recommendations may be updated based on patient status, additional functional criteria and insurance authorization.  Follow Up Recommendations Skilled nursing-short term rehab (<3 hours/day) Can patient physically be transported by private vehicle:  Yes    Assistance Recommended at Discharge Frequent or constant Supervision/Assistance  Patient can return home with the following  Direct supervision/assist for financial management;Assistance with cooking/housework;Help with stairs or ramp for entrance;Assist for transportation;Direct supervision/assist for medications management;A little help with bathing/dressing/bathroom;A lot of help with walking and/or transfers    Equipment Recommendations Other (comment) (TBD at next venue of care)  Recommendations for Other Services       Functional Status Assessment Patient has had a recent decline in their functional status and demonstrates the ability to make significant improvements in function in a reasonable and predictable amount of time.     Precautions / Restrictions Precautions Precautions: Fall Restrictions Weight Bearing Restrictions: No Other Position/Activity Restrictions: Heavy drift to the L with amb      Mobility  Bed Mobility Overal bed mobility: Needs Assistance Bed Mobility: Supine to Sit, Sit to Supine     Supine to sit: Min assist Sit to supine: Supervision   General bed mobility comments: Min A for trunk control during sup to sit; extra time and effort with sit to sup but no physical assist needed    Transfers Overall transfer level: Needs assistance Equipment used: Rolling walker (2 wheels) Transfers: Sit to/from Stand Sit to Stand: Min guard           General transfer comment: Significant effort needed to come to standing but no physical assist required    Ambulation/Gait Ambulation/Gait assistance: Min assist, Mod assist Gait Distance (Feet): 50 Feet Assistive device: Rolling walker (2 wheels) Gait Pattern/deviations: Step-through pattern, Decreased step length - right, Decreased step length - left, Shuffle, Trunk flexed, Drifts right/left Gait velocity: decreased     General Gait Details:  Heavy drifting always to the left with min to mod A  to guide the RW; pt consistently would run into walls and other obstacles if not assisted, high fall risk; slow cadence with short, shuffling steps but no buckling or LOB noted  Stairs            Wheelchair Mobility    Modified Rankin (Stroke Patients Only)       Balance Overall balance assessment: Needs assistance Sitting-balance support: No upper extremity supported, Feet supported Sitting balance-Leahy Scale: Fair     Standing balance support: Bilateral upper extremity supported, During functional activity, Reliant on assistive device for balance Standing balance-Leahy Scale: Fair                               Pertinent Vitals/Pain Pain Assessment Pain Assessment: 0-10 Pain Score: 5  Pain Location: chronic back pain per pt Pain Descriptors / Indicators: Sore Pain Intervention(s): Repositioned, Premedicated before session, Monitored during session    Home Living Family/patient expects to be discharged to:: Private residence Living Arrangements: Alone Available Help at Discharge: Available PRN/intermittently;Family Type of Home: House Home Access: Stairs to enter Entrance Stairs-Rails: None Entrance Stairs-Number of Steps: 2   Home Layout: One level Home Equipment: Standard Walker;BSC/3in1;Cane - single point;Grab bars - tub/shower Additional Comments: home set up and PLOF taken from prior admission as well as from patient; patient not a reliable historian    Prior Function Prior Level of Function : Patient poor historian/Family not available             Mobility Comments: Mod Ind amb with a RW, no fall history per patient ADLs Comments: Family assists with driving but pt Ind with ADLs per pt report     Hand Dominance   Dominant Hand: Right    Extremity/Trunk Assessment   Upper Extremity Assessment Upper Extremity Assessment: Generalized weakness    Lower Extremity Assessment Lower Extremity Assessment: Generalized weakness        Communication   Communication: No difficulties  Cognition Arousal/Alertness: Lethargic Behavior During Therapy: WFL for tasks assessed/performed Overall Cognitive Status: No family/caregiver present to determine baseline cognitive functioning                                          General Comments      Exercises Other Exercises Other Exercises: Pt education provided on scanning environment for obstacles   Assessment/Plan    PT Assessment Patient needs continued PT services  PT Problem List Decreased strength;Decreased mobility;Decreased safety awareness;Decreased activity tolerance;Decreased balance;Decreased knowledge of use of DME       PT Treatment Interventions DME instruction;Therapeutic exercise;Gait training;Balance training;Neuromuscular re-education;Stair training;Functional mobility training;Therapeutic activities;Patient/family education    PT Goals (Current goals can be found in the Care Plan section)  Acute Rehab PT Goals Patient Stated Goal: To get home PT Goal Formulation: With patient Time For Goal Achievement: 06/13/22 Potential to Achieve Goals: Fair    Frequency Min 2X/week     Co-evaluation               AM-PAC PT "6 Clicks" Mobility  Outcome Measure Help needed turning from your back to your side while in a flat bed without using bedrails?: A Little Help needed moving from lying on your back to sitting on the side of a flat bed without  using bedrails?: A Little Help needed moving to and from a bed to a chair (including a wheelchair)?: A Little Help needed standing up from a chair using your arms (e.g., wheelchair or bedside chair)?: A Little Help needed to walk in hospital room?: A Little Help needed climbing 3-5 steps with a railing? : A Lot 6 Click Score: 17    End of Session Equipment Utilized During Treatment: Gait belt Activity Tolerance: Patient tolerated treatment well Patient left: in bed;with call bell/phone  within reach;with bed alarm set;with nursing/sitter in room Nurse Communication: Mobility status PT Visit Diagnosis: Other abnormalities of gait and mobility (R26.89);Difficulty in walking, not elsewhere classified (R26.2);Muscle weakness (generalized) (M62.81)    Time: 8882-8003 PT Time Calculation (min) (ACUTE ONLY): 23 min   Charges:     PT Treatments $Gait Training: 8-22 mins       D. Elly Modena PT, DPT 05/31/22, 4:19 PM

## 2022-05-31 NOTE — Progress Notes (Addendum)
Mobility Specialist - Progress Note   05/31/22 1218  Mobility  Activity Ambulated with assistance in hallway;Stood at bedside;Dangled on edge of bed  Level of Assistance Moderate assist, patient does 50-74%  Assistive Device Front wheel walker  Distance Ambulated (ft) 40 ft  Activity Response Tolerated well  $Mobility charge 1 Mobility     Pt sitting EOB upon arrival with MS Lawanda present, lethargic throughout session. Pt voices having trouble keeping her eyes open throughout, MS Lawanda educates on medication causing tiredness. Pt completes STS MinA and transfers to [facility] for urinary output. Pt ambulates 92ft to door -- seated rest break taken d/t LE weakness and mild buckling. Pt continues 60ft  in hallway Min/Mod Guard + vc and RW steering from [employer] d/t drifting. Pt frequently pauses but is able to initiate gait once RW is steered by author taking about 5 steps at a time. Pt returns to chair and is wheeled back into room. Pt returns to bed with MinA and is left with needs in reach, bed alarm set. Tolerates well.  Clarisa Schools Mobility Specialist 05/31/22, 12:23 PM

## 2022-05-31 NOTE — Plan of Care (Signed)
Pt AAOx2, increased delirium from last night. Pt does not respond well to ativan. MD notified. VS are stable. Plan for mobility and reorientation as tolerated. Currently resting in the chair, call light within reach. Will continue to monitor.

## 2022-05-31 NOTE — Discharge Summary (Signed)
Physician Discharge Summary   Patient: Tanya Cole MRN: [id number]  DOB: 1933/07/13   Admit:     Date of Admission: 05/29/2022 Admitted from: home -lives in her own home but sister stays with her, daughter/son on the weekends stay with her..   Discharge: Date of discharge: 06/01/2022 (tentative_ Disposition: Home with home services Condition at discharge: fair  CODE STATUS: DNR     Discharge Physician: Sunnie Nielsen, DO [facility]     PCP: Dale Rushmore, MD  Recommendations for Outpatient Follow-up:  Follow up with PCP [facility] [place], MD in 1 weeks Please obtain labs/tests: UA, CBC, BMP in 1 week Please follow up on the following pending results: none. Advised to take levaquin 250 mg daily for three more days.   Discharge Instructions     Call MD for:  difficulty breathing, headache or visual disturbances   Complete by: As directed    Call MD for:  persistant nausea and vomiting   Complete by: As directed    Diet - low sodium heart healthy   Complete by: As directed    Diet Carb Modified   Complete by: As directed    Discharge instructions   Complete by: As directed    Advised to follow-up with primary care physician in 1 week. Advised to take Levaquin 250 mg daily for 3 days.   Increase activity slowly   Complete by: As directed        Brief/Interim Summary: Tanya Cole is a 88 y.o. female who  has a past medical history of (HFpEF) heart failure with preserved ejection fraction (HCC), Asthma, CAD (coronary artery disease), CHF (congestive heart failure) (HCC), Diabetes mellitus (HCC), Hypercholesterolemia, Hypertension, Myocardial infarction (HCC), Osteopenia, Palpitations, Polymyalgia rheumatica syndrome (HCC), Reactive airway disease, and Severe sepsis (HCC). She presented to the ED via EMS from home 05/29/22 complaining of generalized weakness and SOB x1-2 days. Reports compliance w/ home COPD and cardiac medications. Hx  polymyalgia rheumatica, reports was told to stop steroids, but then was restarted, last dose steroids was yesterday. 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  07/30: ER course and early admission - saturating well on RA but desats w/ minimal activity. Labs notable for leukocytosis 12.6, lactate elevated at 2.4. BNP pending. COVID negative. CXR R>L new infiltrate concerning for pneumonia w/ chronic CHF changes. Received 500 mL IVF, azithromycin, cefepime. BNP slightly up 07/31: Significantly agitated in the evening/overnight requiring benzodiazepine and antipsychotic, this morning appears lucid.  BCx NG <24h.  WBC still elevated, otherwise VSS, not meeting sepsis criteria as of today. 08/01: BCx NGx1 day, UCx unable to be added onto ED specimen and patient not been cooperative with obtaining specimen due to dementia, significant sundowning/delirium, sitter ordered. WBC and Cr trending favorably. On room air.  Medically, stable for discharge but family asks to hold until tomorrow so that they can arrange around-the-clock care at home.  PT had some concerns for running into objects on the left, patient does not have left-sided neglect but she does have left field vision deficit family states she is getting cataract surgery for this next week.      8/2: Patient has been doing much better.  She has much improved.  She is fully oriented and following commands.  She is back to baseline.  Lactic acid normalized.  Antibiotics changed to oral Levaquin for 3 more days.  PT recommended a skilled nursing  facility but family wants to take her home with home health services.  Home health services been arranged.  Patient is being discharged home.   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   Resolved by next hospital day 05/30/2022 With hospitalization and IV abx in past 3 mos  (+)CXR findings  COPD possible exacerbation but less likely, clear lung sounds  Azithromycin and Cefepime started 05/29/2022 --> flouroquinolone suggested for discharge to cover pneumonia / UTI since unable to confirm UCx Supplemental O2 --> RA now DuoNebs --> home inhaler on discharge  Home inhaler equivalent: Breo Ellipta daily --> home inhaler on discharge  Holding steroids given recent problems w/ steroid induced psychosis and pt has improved without them   Hypertension BP has been stable VS per protocol Adjust Rx as needed  Hypercholesterolemia continue home statin   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) Continue aspirin, statin, beta blocker Not on ACE/ARB at home, would add as BP allows   Chronic diastolic CHF (congestive heart failure) (HCC) Received fluids in ED for sepsis  BNP mild elevated approx 500 Strict I&O difficult d/t incontinence/dementia  Low dose Lasix here --> prn lasix on discharge and follow outpatient  COPD (chronic obstructive pulmonary disease) (HCC) See above  Paroxysmal atrial fibrillation with RVR (HCC) Sinus rhythm at this time  Not on AC at home d/t fall risk  continue home amiodarone and metoprolol Telemetry x12 hours  Abnormal urinalysis (+)leuk, WBC 11-20, (+)nitrite UCx unable to complete see above Cefepime for pneumonia should cover pending Cx --> consider FQ on discharge     Discharge  Instructions  Allergies as of 05/31/2022       Reactions   Tramadol Itching, Nausea And Vomiting        Follow-up Information     Dale [place]a [place], MD Follow up in 1 week(s).   Specialty: Internal Medicine Contact information: [address] [place] [address] [phone]         Iran Ouch, MD .   Specialty: Cardiology Contact information: [address] [place] [address] [phone]                 Allergies  Allergen Reactions   Tramadol Itching and Nausea And Vomiting     Subjective: Patient was seen and examined at bedside.  She is much alert oriented following full commands.   She wants to be discharged.   Discharge Exam: Vitals:   06/01/22 0448 06/01/22 0933  BP: 122/63 129/72  Pulse: 78 76  Resp: 20 18  Temp: 98 F (36.7 C) 98.4 F (36.9 C)  SpO2: 95% 94%   General: Pt is alert, awake, not in acute distress Cardiovascular: RRR, S1/S2 +, no rubs, no gallops Respiratory: CTA bilaterally, no wheezing, no rhonchi Abdominal: Soft, NT, ND, bowel sounds + Extremities: no edema, no cyanosis Neurological: EOMI, normal coordination, no left-sided neglect, patient has difficulty seeing peripherally on the left especially  The results of significant diagnostics from this hospitalization (including imaging, microbiology, ancillary and laboratory) are listed below for reference.     Microbiology: Recent Results (from the past 240 hour(s))  Blood Culture (routine x 2)     Status: None (Preliminary result)   Collection Time: 05/29/22  9:59 AM   Specimen: BLOOD  Result Value Ref Range Status   Specimen Description BLOOD LEFT THUMB  Final   Special Requests   Final    BOTTLES DRAWN AEROBIC AND ANAEROBIC Blood Culture adequate volume   Culture   Final    NO GROWTH 3 DAYS Performed at [facility], [address], [place], [place] [address]    Report Status PENDING  Incomplete   SARS Coronavirus 2 by RT PCR (hospital order, performed in [facility] hospital lab) *cepheid single result test* Anterior Nasal Swab     Status: None   Collection Time: 05/29/22  9:59 AM   Specimen: Anterior Nasal Swab  Result Value Ref Range Status   SARS Coronavirus 2 by RT PCR NEGATIVE NEGATIVE Final    Comment: (NOTE) SARS-CoV-2 target nucleic acids are NOT DETECTED.  The SARS-CoV-2 RNA is generally detectable in upper and lower respiratory specimens during the acute phase of infection. The lowest concentration of SARS-CoV-2 viral copies this assay can detect is 250 copies / mL. A negative result does not preclude SARS-CoV-2 infection and should not be used as the sole basis for treatment or other patient management decisions.  A negative result may occur with improper specimen collection / handling, submission of specimen other than nasopharyngeal swab, presence of viral mutation(s) within the areas targeted by this assay, and inadequate number of viral copies (<250 copies / mL). A negative result must be combined with clinical observations, patient history, and epidemiological information.  Fact Sheet for Patients:   RoadLapTop.co.za  Fact Sheet for Healthcare Providers: http://kim-miller.com/  This test is not yet approved or  cleared by the [place] FDA and has been authorized for detection and/or diagnosis of SARS-CoV-2 by FDA under an Emergency Use Authorization (EUA).  This EUA will remain in effect (meaning this test can be used) for the duration of the COVID-19 declaration under Section 564(b)(1) of the Act, 21 U.S.C. section 360bbb-3(b)(1), unless the authorization is terminated or revoked sooner.  Performed at [facility], [address] [place], [place] [address]   Blood Culture (routine x 2)     Status: None (Preliminary result)   Collection Time: 05/29/22 10:03 AM   Specimen: BLOOD  Result Value  Ref Range Status   Specimen Description BLOOD LEFT HAND  Final   Special Requests   Final    BOTTLES DRAWN AEROBIC AND ANAEROBIC Blood Culture adequate volume   Culture   Final    NO GROWTH 3 DAYS Performed at [facility], [address], [place], [place] [address]    Report Status PENDING  Incomplete  Urine Culture     Status: Abnormal (Preliminary result)   Collection Time: 05/29/22 11:26 AM   Specimen: Urine, Random  Result Value Ref Range Status   Specimen Description   Final    URINE, RANDOM Performed at [facility], [address], [place], [place] [address]    Special Requests   Final    NONE Performed at [facility], [address], [place], [place] [address]    Culture (A)  Final    10,000 COLONIES/mL GRAM NEGATIVE RODS SUSCEPTIBILITIES TO FOLLOW Performed at [facility], [address], [place], [place] [address]    Report Status PENDING  Incomplete     Labs: BNP (last 3 results) Recent Labs    10/31/21 0931 01/25/22 1128 05/29/22 0907  BNP 210.4* 123.5* 544.1*   Basic Metabolic Panel: Recent Labs  Lab 05/29/22 0907 05/30/22 0645 05/31/22 0411  NA 137 138 139  K 3.9 3.9 3.5  CL 105 98 100  CO2 GLUCOSE 150* 148* 166*  BUN CREATININE 0.61 0.72 0.83  CALCIUM 8.5* 8.6* 8.7*   Liver Function Tests: Recent Labs  Lab 05/29/22 0907  AST 16  ALT 13  ALKPHOS 55  BILITOT 0.8  PROT 6.5  ALBUMIN 3.4*   No results for input(s): "LIPASE", "AMYLASE" in the last 168 hours. No results for input(s): "AMMONIA" in the last 168 hours. CBC: Recent Labs  Lab 05/29/22 0907 05/30/22 0645 05/31/22 0411  WBC 17.6* 15.0* 12.2*  NEUTROABS 14.4*  --   --   HGB 11.3* 12.0 11.5*  HCT 34.8* 36.4 35.0*  MCV 88.5 85.6 85.8  PLT 233 281 270   Cardiac Enzymes: No results for input(s): "CKTOTAL", "CKMB", "CKMBINDEX", "TROPONINI" in the last 168 hours. BNP: Invalid input(s): "POCBNP" CBG: No results for  input(s): "GLUCAP" in the last 168 hours. D-Dimer No results for input(s): "DDIMER" in the last 72 hours. Hgb A1c No results for input(s): "HGBA1C" in the last 72 hours. Lipid Profile No results for input(s): "CHOL", "HDL", "LDLCALC", "TRIG", "CHOLHDL", "LDLDIRECT" in the last 72 hours. Thyroid function studies No results for input(s): "TSH", "T4TOTAL", "T3FREE", "THYROIDAB" in the last 72 hours.  Invalid input(s): "FREET3" Anemia work up No results for input(s): "VITAMINB12", "FOLATE", "FERRITIN", "TIBC", "IRON", "RETICCTPCT" in the last 72 hours. Urinalysis    Component Value Date/Time   COLORURINE YELLOW (A) 05/29/2022 1126   APPEARANCEUR CLEAR (A) 05/29/2022 1126   APPEARANCEUR Clear 02/18/2013 2004   LABSPEC 1.012 05/29/2022 1126   LABSPEC 1.024 02/18/2013 2004   PHURINE 6.0 05/29/2022 1126   GLUCOSEU NEGATIVE 05/29/2022 1126   GLUCOSEU NEGATIVE 01/28/2015 0810   HGBUR NEGATIVE 05/29/2022 1126   BILIRUBINUR NEGATIVE 05/29/2022 1126   BILIRUBINUR neg 01/16/2015 1437   BILIRUBINUR Negative 02/18/2013 2004   KETONESUR NEGATIVE 05/29/2022 1126   PROTEINUR NEGATIVE 05/29/2022 1126   UROBILINOGEN 0.2 01/28/2015 0810   NITRITE POSITIVE (A) 05/29/2022 1126   LEUKOCYTESUR SMALL (A) 05/29/2022 1126   LEUKOCYTESUR Trace 02/18/2013 2004   Sepsis Labs Recent Labs  Lab 05/29/22 0907 05/30/22 0645 05/31/22 0411  WBC 17.6* 15.0* 12.2*   Microbiology Recent Results (from the past 240 hour(s))  Blood Culture (routine x 2)     Status: None (Preliminary result)   Collection Time: 05/29/22  9:59 AM   Specimen: BLOOD  Result Value Ref Range Status   Specimen Description BLOOD LEFT THUMB  Final   Special Requests   Final    BOTTLES DRAWN AEROBIC AND ANAEROBIC Blood Culture adequate volume   Culture   Final    NO GROWTH 3 DAYS Performed at [facility], [address], [place], [place] [address]    Report Status PENDING  Incomplete  SARS Coronavirus 2 by RT PCR  (hospital order, performed in [facility] hospital lab) *cepheid single result test* Anterior Nasal Swab     Status: None   Collection Time: 05/29/22  9:59 AM   Specimen: Anterior Nasal Swab  Result Value Ref Range Status   SARS Coronavirus  2 by RT PCR NEGATIVE NEGATIVE Final    Comment: (NOTE) SARS-CoV-2 target nucleic acids are NOT DETECTED.  The SARS-CoV-2 RNA is generally detectable in upper and lower respiratory specimens during the acute phase of infection. The lowest concentration of SARS-CoV-2 viral copies this assay can detect is 250 copies / mL. A negative result does not preclude SARS-CoV-2 infection and should not be used as the sole basis for treatment or other patient management decisions.  A negative result may occur with improper specimen collection / handling, submission of specimen other than nasopharyngeal swab, presence of viral mutation(s) within the areas targeted by this assay, and inadequate number of viral copies (<250 copies / mL). A negative result must be combined with clinical observations, patient history, and epidemiological information.  Fact Sheet for Patients:   RoadLapTop.co.za  Fact Sheet for Healthcare Providers: http://kim-miller.com/  This test is not yet approved or  cleared by the [place] FDA and has been authorized for detection and/or diagnosis of SARS-CoV-2 by FDA under an Emergency Use Authorization (EUA).  This EUA will remain in effect (meaning this test can be used) for the duration of the COVID-19 declaration under Section 564(b)(1) of the Act, 21 U.S.C. section 360bbb-3(b)(1), unless the authorization is terminated or revoked sooner.  Performed at [facility], [address] [place], [place] [address]   Blood Culture (routine x 2)     Status: None (Preliminary result)   Collection Time: 05/29/22 10:03 AM   Specimen: BLOOD  Result Value Ref Range Status   Specimen  Description BLOOD LEFT HAND  Final   Special Requests   Final    BOTTLES DRAWN AEROBIC AND ANAEROBIC Blood Culture adequate volume   Culture   Final    NO GROWTH 3 DAYS Performed at [facility], [address] [place], [place] [address]    Report Status PENDING  Incomplete  Urine Culture     Status: Abnormal (Preliminary result)   Collection Time: 05/29/22 11:26 AM   Specimen: Urine, Random  Result Value Ref Range Status   Specimen Description   Final    URINE, RANDOM Performed at [name], [address] [place], [place] [address]    Special Requests   Final    NONE Performed at [facility], [address] [place], [place] [address]    Culture (A)  Final    10,000 COLONIES/mL GRAM NEGATIVE RODS SUSCEPTIBILITIES TO FOLLOW Performed at [facility], [address] [place], [place] [address]    Report Status PENDING  Incomplete   Imaging DG Chest Port 1 View  Result Date: 05/29/2022 CLINICAL DATA:  Per EMS, Pt, from home, c/o weakness x2 days and SOB starting this morning. Dry cough noted. Denies pain. Hx of COPD and HTN EXAM: PORTABLE CHEST - 1 VIEW COMPARISON:  05/06/2022 FINDINGS: New coarse interstitial infiltrates or edema throughout the right lung, and to less degree on the left predominately at the base. There is some patchy airspace opacities in the lung bases as well. Heart size and mediastinal contours are within normal limits. Aortic Atherosclerosis (ICD10-170.0). No effusion. Visualized bones unremarkable. IMPRESSION: New asymmetric interstitial infiltrates or edema, right worse than left Electronically Signed   By: Corlis Leak M.D.   On: 05/29/2022 09:25       SIGNED:  Cipriano Bunker DO [facility]

## 2022-05-31 NOTE — Progress Notes (Signed)
Hollering out an innumerable amount during the night; Ativan X 2 given without effect. Windy Carina, RN 5:32 AM 05/31/2022

## 2022-06-01 DIAGNOSIS — J189 Pneumonia, unspecified organism: Secondary | ICD-10-CM | POA: Diagnosis not present

## 2022-06-01 LAB — LACTIC ACID, PLASMA: Lactic Acid, Venous: 0.8 mmol/L (ref 0.5–1.9)

## 2022-06-01 MED ORDER — QUETIAPINE FUMARATE 100 MG PO TABS: 100.0000 mg | ORAL_TABLET | Freq: Every day | ORAL | 0 refills | Status: DC

## 2022-06-01 MED ORDER — LEVOFLOXACIN 250 MG PO TABS: 250.0000 mg | ORAL_TABLET | Freq: Every day | ORAL | 0 refills | Status: DC

## 2022-06-01 MED ORDER — LEVOFLOXACIN 500 MG PO TABS: 500.0000 mg | ORAL_TABLET | Freq: Every day | ORAL | 0 refills | Status: DC

## 2022-06-01 MED ORDER — LEVOFLOXACIN 250 MG PO TABS
250.0000 mg | ORAL_TABLET | Freq: Every day | ORAL | Status: DC
  Administered 2022-06-01: 250 mg via ORAL
  Filled 2022-06-01: qty 1

## 2022-06-01 MED ORDER — FUROSEMIDE 40 MG PO TABS: 40.0000 mg | ORAL_TABLET | Freq: Every day | ORAL | 0 refills | Status: DC | PRN

## 2022-06-01 MED ORDER — LEVOFLOXACIN 250 MG PO TABS: 250.0000 mg | ORAL_TABLET | Freq: Every day | ORAL | 0 refills | Status: AC

## 2022-06-01 NOTE — Plan of Care (Signed)
Pt AAOx4, no pain. VS are WNL. Pt appears to be at baseline this morning. AVS and education provided to family. IV removed with no complications. Pt transported by [facility] to main lobby where family will transport her home.

## 2022-06-01 NOTE — Progress Notes (Signed)
PROGRESS NOTE    Joppa Ky  KKX:[id number] DOB: February 14, 1933 DOA: 05/29/2022 PCP: Dale , MD   Brief Narrative:  Tanya Cole is a 88 y.o. female with past medical history of (HFpEF) heart failure with preserved ejection fraction (HCC), Asthma, CAD (coronary artery disease), CHF (congestive heart failure) (HCC), Diabetes mellitus (HCC), Hypercholesterolemia, Hypertension, Myocardial infarction (HCC), Osteopenia, Palpitations, Polymyalgia rheumatica syndrome (HCC), Reactive airway disease, and Severe sepsis (HCC). She presented to the ED via EMS from home 05/29/22 complaining of generalized weakness and SOB x1-2 days. Reports compliance w/ home COPD and cardiac medications. Hx polymyalgia rheumatica, reports was told to stop steroids, but then was restarted, last dose steroids was yesterday. 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  07/30: ER course and early admission - saturating well on RA but desats w/ minimal activity. Labs notable for leukocytosis 12.6, lactate elevated at 2.4. BNP pending. COVID negative. CXR R>L new infiltrate concerning for pneumonia w/ chronic CHF changes. Received 500 mL IVF, azithromycin, cefepime. BNP slightly up 07/31: Significantly agitated in the evening/overnight requiring benzodiazepine and antipsychotic, this morning appears lucid.  BCx NG <24h.  WBC still elevated, otherwise VSS, not meeting sepsis criteria as of today. 08/01: BCx NGx1 day, UCx unable to be added onto ED specimen and patient not been cooperative with obtaining specimen due to dementia, significant sundowning/delirium, sitter ordered. WBC and Cr trending favorably. On room air.  Medically, stable for discharge but family asks to hold until tomorrow so that they can arrange around-the-clock care at home.  PT had some concerns for running into objects on the left, patient does not have  left-sided neglect but she does have left field vision deficit family states she is getting cataract surgery for this next week.   8/2: Patient has been doing much better.  She has much improved.  She is fully oriented and following commands.  She is back to baseline.  Lactic acid normalized.  Antibiotics changed to oral Levaquin for 3 more days.  PT recommended skilled nursing facility but family wants to take her home with home health services.  Home health services been arranged.  Patient is being discharged home.   Assessment & Plan:   Principal Problem:   Community acquired pneumonia Active Problems:   Hypertension   Hypercholesterolemia   Reactive airway disease   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)   Chronic diastolic CHF (congestive heart failure) (HCC)   COPD (chronic obstructive pulmonary disease) (HCC)   Paroxysmal atrial fibrillation with RVR (HCC)   Abnormal urinalysis  Community acquired pneumonia Meeting sepsis criteria, POA 05/29/2022, with leukocytosis and tachypnea.   Resolved by next hospital day 05/30/2022 With hospitalization and IV abx in past 3 mos.  (+)CXR findings  COPD possible exacerbation but less likely, clear lung sounds  Azithromycin and Cefepime started 05/29/2022 --> flouroquinolone suggested for discharge to cover pneumonia / UTI since unable to confirm UCx Supplemental O2 --> RA now DuoNebs --> home inhaler on discharge  Home inhaler equivalent: Breo Ellipta daily --> home inhaler on discharge  Holding steroids given recent problems w/ steroid induced psychosis and pt has improved without them. Patient is being discharged home on Levaquin for 3 more days.   Hypertension BP has been stable VS per protocol Adjust Rx as needed   Hypercholesterolemia continue home statin    Coronary artery disease of native heart with stable angina  pectoris (HCC) Continue aspirin, statin, beta blocker Not on ACE/ARB at home,  would add as BP allows    Chronic diastolic CHF (congestive heart failure) (HCC) Received fluids in ED for sepsis  BNP mild elevated approx 500 Strict I&O difficult d/t incontinence/dementia  Low dose Lasix here --> prn lasix on discharge and follow outpatient   COPD (chronic obstructive pulmonary disease) (HCC) Continue home inhalers   Paroxysmal atrial fibrillation with RVR (HCC) Sinus rhythm at this time  Not on AC at home d/t fall risk  continue home amiodarone and metoprolol Telemetry x12 hours   Abnormal urinalysis (+)leuk, WBC 11-20, (+)nitrite UCx unable to complete see above Cefepime for pneumonia should cover pending Cx --> consider FQ on discharge      DVT prophylaxis: (SCDs Code Status: DNR Family Communication: No family at bedside Disposition Plan:   Patient is being discharged home today   Consultants:   None  Procedures: Antimicrobials:  Anti-infectives (From admission, onward)    Start     Dose/Rate Route Frequency Ordered Stop   06/01/22 1145  levofloxacin (LEVAQUIN) tablet 250 mg        250 mg Oral Daily 06/01/22 1053 06/04/22 0959   06/01/22 0000  levofloxacin (LEVAQUIN) 500 MG tablet  Status:  Discontinued        500 mg Oral Daily 06/01/22 1057 06/01/22    06/01/22 0000  levofloxacin (LEVAQUIN) 250 MG tablet  Status:  Discontinued        250 mg Oral Daily 06/01/22 1057 06/01/22    06/01/22 0000  levofloxacin (LEVAQUIN) 250 MG tablet        250 mg Oral Daily 06/01/22 1132 06/04/22 2359   05/30/22 1030  azithromycin (ZITHROMAX) 500 mg in sodium chloride 0.9 % 250 mL IVPB  Status:  Discontinued        500 mg 250 mL/hr over 60 Minutes Intravenous Every 24 hours 05/29/22 1443 06/01/22 1045   05/30/22 1000  ceFEPIme (MAXIPIME) 2 g in sodium chloride 0.9 % 100 mL IVPB  Status:  Discontinued        2 g 200 mL/hr over 30 Minutes Intravenous Every 24 hours 05/29/22 1443 06/01/22 1045   05/29/22 0945  ceFEPIme (MAXIPIME) 2 g in sodium chloride 0.9 % 100  mL IVPB        2 g 200 mL/hr over 30 Minutes Intravenous  Once 05/29/22 0933 05/29/22 1047   05/29/22 0945  azithromycin (ZITHROMAX) 500 mg in sodium chloride 0.9 % 250 mL IVPB        500 mg 250 mL/hr over 60 Minutes Intravenous  Once 05/29/22 0933 05/29/22 1236       Subjective: Patient was seen and examined at bedside.  Overnight events noted.   Patient appears much improved today, following commands able to move extremities.   Patient is being discharged home today.  Home health services been arranged.  Objective: Vitals:   05/31/22 1958 05/31/22 2329 06/01/22 0448 06/01/22 0933  BP: (!) 115/46 116/65 122/63 129/72  Pulse: 74 76 78 76  Resp: Temp: 98.7 F (37.1 C)  98 F (36.7 C) 98.4 F (36.9 C)  TempSrc: Oral     SpO2: 92%  95% 94%  Weight:      Height:       No intake or output data in the 24 hours ending 06/01/22 1128 Filed Weights   05/29/22 0857  Weight: 62.9 kg    Examination:  General exam: Appears comfortable,  not in any acute distress.  Deconditioned, elderly frail. Respiratory system: CTA bilaterally, no wheezing, no crackles, normal respiratory effort. Cardiovascular system: S1 & S2 heard, regular rate and rhythm, no murmur. Gastrointestinal system: Abdomen is soft, non tender, non distended, BS+ Central nervous system: Alert and oriented x 2. No focal neurological deficits. Extremities: No edema, no cyanosis, no clubbing. Skin: No rashes, lesions or ulcers Psychiatry: Judgement and insight appear normal. Mood & affect appropriate.     Data Reviewed: I have personally reviewed following labs and imaging studies  CBC: Recent Labs  Lab 05/29/22 0907 05/30/22 0645 05/31/22 0411  WBC 17.6* 15.0* 12.2*  NEUTROABS 14.4*  --   --   HGB 11.3* 12.0 11.5*  HCT 34.8* 36.4 35.0*  MCV 88.5 85.6 85.8  PLT 233 281 270   Basic Metabolic Panel: Recent Labs  Lab 05/29/22 0907 05/30/22 0645 05/31/22 0411  NA 137 138 139  K 3.9 3.9 3.5  CL  105 98 100  CO2 GLUCOSE 150* 148* 166*  BUN CREATININE 0.61 0.72 0.83  CALCIUM 8.5* 8.6* 8.7*   GFR: Estimated Creatinine Clearance: 41.3 mL/min (by C-G formula based on SCr of 0.83 mg/dL). Liver Function Tests: Recent Labs  Lab 05/29/22 0907  AST 16  ALT 13  ALKPHOS 55  BILITOT 0.8  PROT 6.5  ALBUMIN 3.4*   No results for input(s): "LIPASE", "AMYLASE" in the last 168 hours. No results for input(s): "AMMONIA" in the last 168 hours. Coagulation Profile: Recent Labs  Lab 05/29/22 0907  INR 1.1   Cardiac Enzymes: No results for input(s): "CKTOTAL", "CKMB", "CKMBINDEX", "TROPONINI" in the last 168 hours. BNP (last 3 results) No results for input(s): "PROBNP" in the last 8760 hours. HbA1C: No results for input(s): "HGBA1C" in the last 72 hours. CBG: No results for input(s): "GLUCAP" in the last 168 hours. Lipid Profile: No results for input(s): "CHOL", "HDL", "LDLCALC", "TRIG", "CHOLHDL", "LDLDIRECT" in the last 72 hours. Thyroid Function Tests: No results for input(s): "TSH", "T4TOTAL", "FREET4", "T3FREE", "THYROIDAB" in the last 72 hours. Anemia Panel: No results for input(s): "VITAMINB12", "FOLATE", "FERRITIN", "TIBC", "IRON", "RETICCTPCT" in the last 72 hours. Sepsis Labs: Recent Labs  Lab 05/29/22 1610 05/29/22 1102 06/01/22 0840  LATICACIDVEN 2.4* 3.1* 0.8    Recent Results (from the past 240 hour(s))  Blood Culture (routine x 2)     Status: None (Preliminary result)   Collection Time: 05/29/22  9:59 AM   Specimen: BLOOD  Result Value Ref Range Status   Specimen Description BLOOD LEFT THUMB  Final   Special Requests   Final    BOTTLES DRAWN AEROBIC AND ANAEROBIC Blood Culture adequate volume   Culture   Final    NO GROWTH 3 DAYS Performed at [facility], [address] [place], [place] [address]    Report Status PENDING  Incomplete  SARS Coronavirus 2 by RT PCR (hospital order, performed in [facility] hospital lab)  *cepheid single result test* Anterior Nasal Swab     Status: None   Collection Time: 05/29/22  9:59 AM   Specimen: Anterior Nasal Swab  Result Value Ref Range Status   SARS Coronavirus 2 by RT PCR NEGATIVE NEGATIVE Final    Comment: (NOTE) SARS-CoV-2 target nucleic acids are NOT DETECTED.  The SARS-CoV-2 RNA is generally detectable in upper and lower respiratory specimens during the acute phase of infection. The lowest concentration of SARS-CoV-2 viral copies this assay can detect is 250  copies / mL. A negative result does not preclude SARS-CoV-2 infection and should not be used as the sole basis for treatment or other patient management decisions.  A negative result may occur with improper specimen collection / handling, submission of specimen other than nasopharyngeal swab, presence of viral mutation(s) within the areas targeted by this assay, and inadequate number of viral copies (<250 copies / mL). A negative result must be combined with clinical observations, patient history, and epidemiological information.  Fact Sheet for Patients:   RoadLapTop.co.za  Fact Sheet for Healthcare Providers: http://kim-miller.com/  This test is not yet approved or  cleared by the [place] FDA and has been authorized for detection and/or diagnosis of SARS-CoV-2 by FDA under an Emergency Use Authorization (EUA).  This EUA will remain in effect (meaning this test can be used) for the duration of the COVID-19 declaration under Section 564(b)(1) of the Act, 21 U.S.C. section 360bbb-3(b)(1), unless the authorization is terminated or revoked sooner.  Performed at [facility], [address] [place], [place] [address]   Blood Culture (routine x 2)     Status: None (Preliminary result)   Collection Time: 05/29/22 10:03 AM   Specimen: BLOOD  Result Value Ref Range Status   Specimen Description BLOOD LEFT HAND  Final   Special Requests    Final    BOTTLES DRAWN AEROBIC AND ANAEROBIC Blood Culture adequate volume   Culture   Final    NO GROWTH 3 DAYS Performed at [facility], [address], [place], [place] [address]    Report Status PENDING  Incomplete  Urine Culture     Status: Abnormal (Preliminary result)   Collection Time: 05/29/22 11:26 AM   Specimen: Urine, Random  Result Value Ref Range Status   Specimen Description   Final    URINE, RANDOM Performed at [facility], [address], [place], [place] [address]    Special Requests   Final    NONE Performed at [facility], [address] [place], [place] [address]    Culture (A)  Final    10,000 COLONIES/mL GRAM NEGATIVE RODS SUSCEPTIBILITIES TO FOLLOW Performed at [facility], [address] [place], [place] [address]    Report Status PENDING  Incomplete    Radiology Studies: No results found.   Scheduled Meds:  amiodarone  200 mg Oral Daily   aspirin EC  81 mg Oral Daily   atorvastatin  80 mg Oral Daily   enoxaparin (LOVENOX) injection  40 mg Subcutaneous Q24H   fluticasone furoate-vilanterol  1 puff Inhalation Daily   folic acid  1 mg Oral Daily   furosemide  40 mg Oral Daily   levofloxacin  250 mg Oral Daily   [START ON 06/04/2022] methotrexate  12.5 mg Oral Q Sat   metoprolol tartrate  25 mg Oral BID   QUEtiapine  100 mg Oral QHS   sodium chloride flush  3 mL Intravenous Q12H   Continuous Infusions:  sodium chloride       LOS: 3 days    Time spent: 50 mins    Forney Kleinpeter, MD [facility]   If 7PM-7AM, please contact night-coverage

## 2022-06-01 NOTE — TOC Progression Note (Signed)
Transition of Care [facility]) - Progression Note    Patient Details  Name: Tanya Cole MRN: [id number] Date of Birth: Nov 12, 1932  Transition of Care [facility]) CM/SW Contact  Chapman Fitch, RN Phone Number: 06/01/2022, 10:14 AM  Clinical Narrative:     Therapy recommending SNF.  Spoke with daughter Elita Quick, the declined SNF and wish to take patient home with home health services through [facility].   Per Pam, son Dannielle Huh will transport at discharge   Expected Discharge Plan: Home w Home Health Services Barriers to Discharge: Continued Medical Work up  Expected Discharge Plan and Services Expected Discharge Plan: Home w Home Health Services       Living arrangements for the past 2 months: Single Family Home                                       Social Determinants of Health (SDOH) Interventions    Readmission Risk Interventions    05/30/2022   10:28 AM 05/08/2022   10:08 AM 05/14/2021    9:26 AM  Readmission Risk Prevention Plan  Transportation Screening Complete Complete Complete  PCP or Specialist Appt within 5-7 Days   Complete  Home Care Screening   Complete  Medication Review (RN CM)   Referral to Pharmacy  Medication Review (RN Care Manager) Complete Complete   PCP or Specialist appointment within 3-5 days of discharge  Complete   HRI or Home Care Consult Complete Complete   SW Recovery Care/Counseling Consult Complete Complete   Palliative Care Screening Not Applicable Not Applicable   Skilled Nursing Facility  Complete

## 2022-06-01 NOTE — Care Management Important Message (Signed)
Important Message  Patient Details  Name: Tanya Cole MRN: [id number] Date of Birth: 18-Nov-1932   Medicare Important Message Given:  Yes     [name] 06/01/2022, 10:14 AM

## 2022-06-01 NOTE — Discharge Instructions (Signed)
Advised to follow-up with PCP 1 week. Advised to take Levaquin to 50 mg daily for 3 more days to complete treatment for community-acquired pneumonia

## 2022-06-01 NOTE — Progress Notes (Addendum)
Mobility Specialist - Progress Note   06/01/22 1000  Mobility  Activity Ambulated with assistance in hallway  Level of Assistance Standby assist, set-up cues, supervision of patient - no hands on  Assistive Device Front wheel walker  Distance Ambulated (ft) 90 ft  Activity Response Tolerated well  $Mobility charge 1 Mobility    Pt lying in bed upon arrival, utilizing RA. More alert this date than yesterday. Pt voiced complaints of soreness in B hips, but pain does not increase with activity. MinA to EOB from flat surface. CGA to stand with cues for hand placement. Pt ambulated in hallway with supervision with improvement from previous sessions. Less assistance needed to navigate RW as pt is responding much better to verbal cues this date. Pt showed improvement with navigating around obstacles, does still veer slightly L but with ability to self-correct. Cues to continue to scan environment to improve safety awareness however. Pt returned back to bed with alarm set, needs in reach.    Filiberto Pinks Mobility Specialist 06/01/22, 11:37 AM

## 2022-06-02 ENCOUNTER — Other Ambulatory Visit: Payer: Self-pay | Admitting: *Deleted

## 2022-06-02 ENCOUNTER — Telehealth: Payer: Self-pay

## 2022-06-02 LAB — URINE CULTURE: Culture: 10000 — AB

## 2022-06-02 NOTE — Telephone Encounter (Signed)
Transition Care Management Unsuccessful Follow-up Telephone Call  Date of discharge and from where:  06/01/22 [facility]  Attempts:  1st Attempt  Reason for unsuccessful TCM follow-up call:  No answer/busy. Will follow    

## 2022-06-02 NOTE — Patient Outreach (Signed)
  Care Coordination [facility] Note Transition Care Management Follow-up Telephone Call Date of discharge and from where: 06/01/22 [facility] How have you been since you were released from the hospital? Daughter states that the patient is doing fairly well. Any questions or concerns? Yes, daughter states that the patient was prescribed Quetiapine at discharge and this medication is listed as causing her confusion.  Items Reviewed: Did the pt receive and understand the discharge instructions provided? Yes  Medications obtained and verified? Yes  Other? No  Any new allergies since your discharge? No  Dietary orders reviewed? Yes Do you have support at home? Yes   Home Care and Equipment/Supplies: Were home health services ordered? yes If so, what is the name of the agency? [facility]  Has the agency set up a time to come to the patient's home? yes Were any new equipment or medical supplies ordered?  No What is the name of the medical supply agency? N/A Were you able to get the supplies/equipment? not applicable Do you have any questions related to the use of the equipment or supplies? No  Functional Questionnaire: (I = Independent and D = Dependent) ADLs: D  Bathing/Dressing- D  Meal Prep- D  Eating- I  Maintaining continence- I  Transferring/Ambulation- D  Managing Meds- D  Follow up appointments reviewed:  PCP Hospital f/u appt confirmed? No  nurse will inform Steffainie Cairrikier [facility] f/u appt confirmed? No , daughter states that a family member is contacting cardiology to make a f/u appointment. Are transportation arrangements needed? No  If their condition worsens, is the pt aware to call PCP or go to the Emergency Dept.? Yes Was the patient provided with contact information for the PCP's office or ED? Yes Was to pt encouraged to call back with questions or concerns? Yes  SDOH assessments and interventions completed:   Yes  Care Coordination  Interventions Activated:  No , patient is currently followed by George Ina  Care Coordination Interventions:   N/A     Encounter Outcome:  Pt. Visit Completed     Blanchie Serve RN, BSN [facility] Care Management [facility] [phone] [email or web address]

## 2022-06-03 LAB — CULTURE, BLOOD (ROUTINE X 2)
Culture: NO GROWTH
Culture: NO GROWTH
Special Requests: ADEQUATE
Special Requests: ADEQUATE

## 2022-06-03 NOTE — Telephone Encounter (Signed)
Transition Care Management Follow-up Telephone Call Date of discharge and from where: 06/01/22 [facility] How have you been since you were released from the hospital? Spoke with daughter, HIPAA compliant. Feet swelling slightly, minimal. Denies pitting edema. Denies tightness. Skin bounces back with touch. Nurse encouraged keeping feet elevated while sitting. Not sleeping very well. Denies n/v/d, fever and all other harmful symptoms. Taking all medications as directed. Family notes back to baseline outside of not sleeping well at bedtime.  Any questions or concerns? Awaiting feedback regarding if okay to give Melatonin to help her rest. Notes she called in earlier today.   Items Reviewed: Did the pt receive and understand the discharge instructions provided? Yes  Medications obtained and verified? Yes , managed by daughter Rinaldo Cloud. Any new allergies since your discharge? No Dietary orders reviewed? Yes Do you have support at home? Yes   Home Care and Equipment/Supplies: Were home health services ordered? Yes, not yet in progress.   Functional Questionnaire: (I = Independent and D = Dependent) ADLs: Family assist as needed. Baseline.   Eating- I  Maintaining continence- I  Transferring/Ambulation- Walker  Managing Meds- Family assist  Follow up appointments reviewed:  PCP Hospital f/u appt confirmed? Yes  Scheduled to see PCP on 06/21/22.  Specialist Hospital f/u appt confirmed? Yes  Scheduled to see Cardiology on 07/2022.  Are transportation arrangements needed? No  If their condition worsens, is the pt aware to call PCP or go to the Emergency Dept.? Yes Was the patient provided with contact information for the PCP's office or ED? Yes Was to pt encouraged to call back with questions or concerns? Yes

## 2022-06-03 NOTE — Telephone Encounter (Signed)
Patient family okay to schedule an earlier if cancellation.

## 2022-06-04 NOTE — Telephone Encounter (Signed)
You had sent message asking to add on Wednesday.  If there is not a cancellation, we will have to add her Wednesday.

## 2022-06-05 DIAGNOSIS — J440 Chronic obstructive pulmonary disease with acute lower respiratory infection: Secondary | ICD-10-CM | POA: Diagnosis not present

## 2022-06-05 DIAGNOSIS — I110 Hypertensive heart disease with heart failure: Secondary | ICD-10-CM | POA: Diagnosis not present

## 2022-06-05 DIAGNOSIS — I5032 Chronic diastolic (congestive) heart failure: Secondary | ICD-10-CM | POA: Diagnosis not present

## 2022-06-05 DIAGNOSIS — I25118 Atherosclerotic heart disease of native coronary artery with other forms of angina pectoris: Secondary | ICD-10-CM | POA: Diagnosis not present

## 2022-06-05 DIAGNOSIS — E119 Type 2 diabetes mellitus without complications: Secondary | ICD-10-CM | POA: Diagnosis not present

## 2022-06-07 DIAGNOSIS — I5032 Chronic diastolic (congestive) heart failure: Secondary | ICD-10-CM | POA: Diagnosis not present

## 2022-06-07 DIAGNOSIS — J440 Chronic obstructive pulmonary disease with acute lower respiratory infection: Secondary | ICD-10-CM | POA: Diagnosis not present

## 2022-06-07 DIAGNOSIS — I110 Hypertensive heart disease with heart failure: Secondary | ICD-10-CM | POA: Diagnosis not present

## 2022-06-07 DIAGNOSIS — I25118 Atherosclerotic heart disease of native coronary artery with other forms of angina pectoris: Secondary | ICD-10-CM | POA: Diagnosis not present

## 2022-06-07 DIAGNOSIS — E119 Type 2 diabetes mellitus without complications: Secondary | ICD-10-CM | POA: Diagnosis not present

## 2022-06-07 NOTE — Telephone Encounter (Signed)
Pt sched for 11am wednesday

## 2022-06-08 ENCOUNTER — Encounter: Payer: Self-pay | Admitting: Internal Medicine

## 2022-06-08 ENCOUNTER — Ambulatory Visit (INDEPENDENT_AMBULATORY_CARE_PROVIDER_SITE_OTHER): Payer: Medicare Other | Admitting: Internal Medicine

## 2022-06-08 ENCOUNTER — Ambulatory Visit
Admission: RE | Admit: 2022-06-08 | Discharge: 2022-06-08 | Disposition: A | Discharge status: Home / Self Care | Payer: Medicare Other | Source: Ambulatory Visit | Attending: Internal Medicine | Admitting: Internal Medicine

## 2022-06-08 VITALS — BP 116/72 | HR 72 | Temp 98.3°F | Resp 15 | Ht 61.0 in | Wt 127.6 lb

## 2022-06-08 DIAGNOSIS — I4891 Unspecified atrial fibrillation: Secondary | ICD-10-CM

## 2022-06-08 DIAGNOSIS — E1159 Type 2 diabetes mellitus with other circulatory complications: Secondary | ICD-10-CM | POA: Diagnosis not present

## 2022-06-08 DIAGNOSIS — I25118 Atherosclerotic heart disease of native coronary artery with other forms of angina pectoris: Secondary | ICD-10-CM

## 2022-06-08 DIAGNOSIS — R8290 Unspecified abnormal findings in urine: Secondary | ICD-10-CM

## 2022-06-08 DIAGNOSIS — M79662 Pain in left lower leg: Secondary | ICD-10-CM | POA: Diagnosis not present

## 2022-06-08 DIAGNOSIS — D509 Iron deficiency anemia, unspecified: Secondary | ICD-10-CM

## 2022-06-08 DIAGNOSIS — J449 Chronic obstructive pulmonary disease, unspecified: Secondary | ICD-10-CM | POA: Diagnosis not present

## 2022-06-08 DIAGNOSIS — R413 Other amnesia: Secondary | ICD-10-CM

## 2022-06-08 DIAGNOSIS — M353 Polymyalgia rheumatica: Secondary | ICD-10-CM | POA: Diagnosis not present

## 2022-06-08 DIAGNOSIS — G479 Sleep disorder, unspecified: Secondary | ICD-10-CM

## 2022-06-08 DIAGNOSIS — J189 Pneumonia, unspecified organism: Secondary | ICD-10-CM

## 2022-06-08 DIAGNOSIS — E7800 Pure hypercholesterolemia, unspecified: Secondary | ICD-10-CM

## 2022-06-08 DIAGNOSIS — I10 Essential (primary) hypertension: Secondary | ICD-10-CM | POA: Diagnosis not present

## 2022-06-08 DIAGNOSIS — I5032 Chronic diastolic (congestive) heart failure: Secondary | ICD-10-CM

## 2022-06-08 LAB — CBC WITH DIFFERENTIAL/PLATELET
Basophils Absolute: 0.1 10*3/uL (ref 0.0–0.1)
Basophils Relative: 0.5 % (ref 0.0–3.0)
Eosinophils Absolute: 0.4 10*3/uL (ref 0.0–0.7)
Eosinophils Relative: 3.3 % (ref 0.0–5.0)
HCT: 34.9 % — ABNORMAL LOW (ref 36.0–46.0)
Hemoglobin: 11.2 g/dL — ABNORMAL LOW (ref 12.0–15.0)
Lymphocytes Relative: 15.8 % (ref 12.0–46.0)
Lymphs Abs: 1.8 10*3/uL (ref 0.7–4.0)
MCHC: 32 g/dL (ref 30.0–36.0)
MCV: 88.9 fl (ref 78.0–100.0)
Monocytes Absolute: 0.7 10*3/uL (ref 0.1–1.0)
Monocytes Relative: 5.8 % (ref 3.0–12.0)
Neutro Abs: 8.6 10*3/uL — ABNORMAL HIGH (ref 1.4–7.7)
Neutrophils Relative %: 74.6 % (ref 43.0–77.0)
Platelets: 330 10*3/uL (ref 150.0–400.0)
RBC: 3.92 Mil/uL (ref 3.87–5.11)
RDW: 15.7 % — ABNORMAL HIGH (ref 11.5–15.5)
WBC: 11.5 10*3/uL — ABNORMAL HIGH (ref 4.0–10.5)

## 2022-06-08 LAB — BASIC METABOLIC PANEL
BUN: 19 mg/dL (ref 6–23)
CO2: 21 mEq/L (ref 19–32)
Calcium: 8.4 mg/dL (ref 8.4–10.5)
Chloride: 106 mEq/L (ref 96–112)
Creatinine, Ser: 0.71 mg/dL (ref 0.40–1.20)
GFR: 76.69 mL/min (ref >60.00)
Glucose, Bld: 109 mg/dL — ABNORMAL HIGH (ref 70–99)
Potassium: 4.3 mEq/L (ref 3.5–5.1)
Sodium: 139 mEq/L (ref 135–145)

## 2022-06-08 LAB — URINALYSIS, ROUTINE W REFLEX MICROSCOPIC
Bilirubin Urine: NEGATIVE
Hgb urine dipstick: NEGATIVE
Ketones, ur: NEGATIVE
Nitrite: NEGATIVE
RBC / HPF: NONE SEEN (ref 0–?)
Specific Gravity, Urine: 1.02 (ref 1.000–1.030)
Urine Glucose: NEGATIVE
Urobilinogen, UA: 0.2 (ref 0.0–1.0)
pH: 6 (ref 5.0–8.0)

## 2022-06-08 MED ORDER — MIRTAZAPINE 7.5 MG PO TABS: ORAL_TABLET | ORAL | 0 refills | Status: DC

## 2022-06-08 NOTE — Progress Notes (Signed)
[name], female   DOB: June 22, 1933, 88 y.o.   MRN: [id number]   Subjective:    [name], female    DOB: 12-20-1932, 88 y.o.   MRN: [id number]   Patient here for hospital follow up.   Chief Complaint  Patient presents with   Hospitalization Follow-up   .   HPI Admitted 05/29/22 - 06/01/22 - after presenting to ED via EMS complaining of weakness and sob. She is accompanied by her son and daughter-n-law. History obtained from all of them. COVID negative.  CXR right > left new infiltrate concerning for pneumonia with chronic CHF changes. Started on abx and given IVF bolus.  Breathing improved.  Transitioned to room air.  Abx changed to oral levaquin.  PT evaluated.  Discharged home with [facility] and 24 hour care.  Since discharge she is feeling better.  Breathing is stable.  No increased cough or congestion.  No increased sob.  No chest pain.  Eating. Family staying with her.  She is off seroquel.  Is haivng problems sleeping.  Family states she is up and down all night.  Request to have something to help her sleep. Also reports increased left calf tenderness.  Increased pain to palpation.  No redness. Has been more sedentary.     Past Medical History:  Diagnosis Date   (HFpEF) heart failure with preserved ejection fraction ([place])    a. TTE 12/19: EF 55-60%, probable HK of the mid apical anterior septal myocardium, Gr1DD, mild AI, mildly dilated LA; b.07/2020 Echo: EF 60-65%, no rwma, Gr2 DD. Nl RV fxn. Mildly dil LA. Mild MR; c. 10/2021 Echo: EF 60-65%, no rwma, mild LVH, nl RV fxn, mod elev PASP, mildly dil LA, mild MR, Ao sclerosis.   Asthma    CAD (coronary artery disease)    a. 09/2018 NSTEMI/PCI: LM min irregs, mLAD 95 (PCI/DES), mLAD-2 60%, LCx mild diff dzs, RCA min irregs; b. 03/2020 MV: EF>65%, no ischemia/scar; c. 07/2020 Cath: LM min irregs, LAD 30p, 48m 90d, D1/2 min irregs, LCX diff dzs throughout, OM1/2/3 mild dzs, RCA 30p, RPDA/RPAV min  irrges. EF 55-65%.   CHF (congestive heart failure) ([place]    Diabetes mellitus ([facility]    Hypercholesterolemia    Hypertension    Myocardial infarction ([place]    Osteopenia    Palpitations    a. 04/2020 [facility]: Avg HR 75. 429 SVT episodes, longest 19 secs @ 133. Occas PACs (3.2%). Rare PVCs (<1%).   Polymyalgia rheumatica syndrome (HCC)    Reactive airway disease    Severe sepsis ([facility]    Past Surgical History:  Procedure Laterality Date   ABDOMINAL HYSTERECTOMY  11/01/1979   prolapse and bleeding, ovaries not removed   BREAST EXCISIONAL BIOPSY Right    CATARACT EXTRACTION Right    CHOLECYSTECTOMY N/A 09/02/2019   Procedure: LAPAROSCOPIC CHOLECYSTECTOMY WITH INTRAOPERATIVE CHOLANGIOGRAM;  Surgeon: POlean Ree MD;  Location: [facility] ORS;  Service: General;  Laterality: N/A;   CORONARY STENT INTERVENTION N/A 10/08/2018   Procedure: CORONARY STENT INTERVENTION;  Surgeon: AWellington Hampshire MD;  [facility];  Service: Cardiovascular;  Laterality: N/A;   ENDOSCOPIC RETROGRADE CHOLANGIOPANCREATOGRAPHY (ERCP) WITH PROPOFOL N/A 08/08/2019   Procedure: ENDOSCOPIC RETROGRADE CHOLANGIOPANCREATOGRAPHY (ERCP) WITH PROPOFOL;  Surgeon: WLucilla Lame MD;  Location: [facility] ENDOSCOPY;  Service: Endoscopy;  Laterality: N/A;   LEFT HEART CATH AND CORONARY ANGIOGRAPHY N/A 10/08/2018   Procedure: LEFT HEART CATH AND CORONARY ANGIOGRAPHY;  Surgeon: AWellington Hampshire MD;  Location: [place] [facility] LAB;  Service: Cardiovascular;  Laterality: N/A;   LEFT HEART CATH AND CORONARY ANGIOGRAPHY N/A 08/10/2020   Procedure: LEFT HEART CATH AND CORONARY ANGIOGRAPHY possible percutaneous intervention;  Surgeon: Wellington Hampshire, MD;  Location: [place] [facility];  Service: Cardiovascular;  Laterality: N/A;   UMBILICAL HERNIA REPAIR  04/30/1993   Family History  Problem Relation Age of Onset   Arthritis Mother    Heart disease Mother    Heart attack Father    Throat cancer Sister    Parkinson's  disease Sister    COPD Brother    COPD Brother    Social History   Socioeconomic History   Marital status: Widowed    Spouse name: Not on file   Number of children: 3   Years of education: Not on file   Highest education level: Not on file  Occupational History   Not on file  Tobacco Use   Smoking status: Never   Smokeless tobacco: Never  Vaping Use   Vaping Use: Never used  Substance and Sexual Activity   Alcohol use: No    Alcohol/week: 0.0 standard drinks of alcohol   Drug use: No   Sexual activity: Not Currently  Other Topics Concern   Not on file  Social History Narrative   No smoking; no alcohol; in [place]; worked in [employer] fund[employer]. Lives by self in [place]. Does all of her own housework. Dtr does food shopping for her.   Social Determinants of Health   Financial Resource Strain: Low Risk  (05/27/2022)   Overall Financial Resource Strain (CARDIA)    Difficulty of Paying Living Expenses: Not hard at all  Food Insecurity: No Food Insecurity (05/27/2022)   Hunger Vital Sign    Worried About Running Out of Food in the Last Year: Never true    Ran Out of Food in the Last Year: Never true  Transportation Needs: No Transportation Needs (05/27/2022)   PRAPARE - Hydrologist (Medical): No    Lack of Transportation (Non-Medical): No  Physical Activity: Insufficiently Active (01/07/2019)   Exercise Vital Sign    Days of Exercise per Week: 2 days    Minutes of Exercise per Session: 10 min  Stress: No Stress Concern Present (05/27/2022)   [place]    Feeling of Stress : Not at all  Social Connections: Moderately Isolated (05/27/2022)   Social Connection and Isolation Panel [NHANES]    Frequency of Communication with Friends and Family: More than three times a week    Frequency of Social Gatherings with Friends and Family: More than three times a week    Attends Religious Services:  More than 4 times per year    Active Member of [employer] or Organizations: No    Attends Archivist Meetings: Never    Marital Status: Widowed     Review of Systems  Constitutional:  Negative for appetite change, fever and unexpected weight change.  HENT:  Negative for sinus pressure.        No increased congestion.   Respiratory:  Negative for chest tightness.        No increased cough.  Breathing stable.   Cardiovascular:  Negative for chest pain, palpitations and leg swelling.  Gastrointestinal:  Negative for abdominal pain, diarrhea, nausea and vomiting.  Genitourinary:  Negative for difficulty urinating and dysuria.  Musculoskeletal:  Negative for joint swelling and myalgias.  Skin:  Negative  for color change and rash.  Neurological:  Negative for dizziness, light-headedness and headaches.  Psychiatric/Behavioral:  Positive for sleep disturbance. Negative for agitation and dysphoric mood.        Objective:     BP 116/72 (BP Location: Left Arm, Patient Position: Sitting, Cuff Size: Small)   Pulse 72   Temp 98.3 F (36.8 C) (Temporal)   Resp 15   Ht '5\' 1"'  (1.549 m)   Wt 127 lb 9.6 oz (57.9 kg)   SpO2 98%   BMI 24.11 kg/m  Wt Readings from Last 3 Encounters:  06/08/22 127 lb 9.6 oz (57.9 kg)  05/29/22 138 lb 10.7 oz (62.9 kg)  05/27/22 125 lb (56.7 kg)    Physical Exam Vitals reviewed.  Constitutional:      General: She is not in acute distress.    Appearance: Normal appearance.  HENT:     Head: Normocephalic and atraumatic.     Right Ear: External ear normal.     Left Ear: External ear normal.  Eyes:     General: No scleral icterus.       Right eye: No discharge.        Left eye: No discharge.     Conjunctiva/sclera: Conjunctivae normal.  Neck:     Thyroid: No thyromegaly.  Cardiovascular:     Rate and Rhythm: Normal rate and regular rhythm.  Pulmonary:     Effort: No respiratory distress.     Breath sounds: Normal breath sounds. No wheezing.   Abdominal:     General: Bowel sounds are normal.     Palpations: Abdomen is soft.     Tenderness: There is no abdominal tenderness.  Musculoskeletal:        General: No swelling or tenderness.     Cervical back: Neck supple. No tenderness.  Lymphadenopathy:     Cervical: No cervical adenopathy.  Skin:    Findings: No erythema or rash.  Neurological:     Mental Status: She is alert.  Psychiatric:        Mood and Affect: Mood normal.        Behavior: Behavior normal.    I have reviewed and confirm Ms Dauphinais's current medications.    Outpatient Encounter Medications as of 06/08/2022  Medication Sig   acetaminophen (TYLENOL) 500 MG tablet Take 500-1,000 mg by mouth every 6 (six) hours as needed for mild pain or moderate pain.   amiodarone (PACERONE) 200 MG tablet Take 200 mg by mouth daily.   aspirin 81 MG tablet Take 81 mg by mouth daily.   atorvastatin (LIPITOR) 80 MG tablet TAKE 1 TABLET BY MOUTH ONCE DAILY AT  6PM   fluticasone-salmeterol (ADVAIR) 250-50 MCG/ACT AEPB Inhale 1 puff into the lungs in the morning and at bedtime.   folic acid (FOLVITE) 1 MG tablet Take 1 mg by mouth daily.   furosemide (LASIX) 40 MG tablet Take 1 tablet (40 mg total) by mouth daily as needed (weight gain 5 lbs over 1-2 days, lower extremity swelling, shortness of breath. If medication does not resolve symptoms in 2 days please call Dr / 911).   ipratropium-albuterol (DUONEB) 0.5-2.5 (3) MG/3ML SOLN Take 3 mLs by nebulization 2 (two) times daily. (Patient taking differently: Take 3 mLs by nebulization 2 (two) times daily as needed (shortness of breath or wheezing).)   metFORMIN (GLUCOPHAGE) 500 MG tablet TAKE 1 TABLET BY MOUTH TWICE DAILY WITH A MEAL   methotrexate (RHEUMATREX) 2.5 MG tablet Take 12.5 mg by mouth  every Saturday.   metoprolol tartrate (LOPRESSOR) 25 MG tablet Take 1 tablet (25 mg total) by mouth 2 (two) times daily.   mirtazapine (REMERON) 7.5 MG tablet 1/2 tablet q hs prn   Multiple  Vitamins-Minerals (PRESERVISION AREDS 2) CAPS Take 1 capsule by mouth 2 (two) times daily.   nitroGLYCERIN (NITROSTAT) 0.4 MG SL tablet Place 1 tablet (0.4 mg total) under the tongue every 5 (five) minutes as needed for chest pain.   ondansetron (ZOFRAN-ODT) 4 MG disintegrating tablet Take 4 mg by mouth 3 (three) times daily as needed for nausea or vomiting.   PROAIR HFA 108 (90 Base) MCG/ACT inhaler Inhale 2 puffs into the lungs every 4 (four) hours as needed for wheezing or shortness of breath.   Vitamin D, Ergocalciferol, (DRISDOL) 50000 units CAPS capsule Take 50,000 Units by mouth every Friday.   [DISCONTINUED] QUEtiapine (SEROQUEL) 100 MG tablet Take 1 tablet (100 mg total) by mouth at bedtime. (Patient not taking: Reported on 06/08/2022)   No facility-administered encounter medications on file as of 06/08/2022.     Lab Results  Component Value Date   WBC 11.5 (H) 06/08/2022   HGB 11.2 (L) 06/08/2022   HCT 34.9 (L) 06/08/2022   PLT 330.0 06/08/2022   GLUCOSE 109 (H) 06/08/2022   CHOL 112 01/26/2022   TRIG 137 01/26/2022   HDL 35 (L) 01/26/2022   LDLDIRECT 136.0 07/03/2020   LDLCALC 50 01/26/2022   ALT 13 05/29/2022   AST 16 05/29/2022   NA 139 06/08/2022   K 4.3 06/08/2022   CL 106 06/08/2022   CREATININE 0.71 06/08/2022   BUN 19 06/08/2022   CO2 21 06/08/2022   TSH 2.064 05/07/2022   INR 1.1 05/29/2022   HGBA1C 8.0 (H) 01/25/2022   MICROALBUR 1.1 07/23/2021    DG Chest Port 1 View  Result Date: 05/29/2022 CLINICAL DATA:  Per EMS, Pt, from home, c/o weakness x2 days and SOB starting this morning. Dry cough noted. Denies pain. Hx of COPD and HTN EXAM: PORTABLE CHEST - 1 VIEW COMPARISON:  05/06/2022 FINDINGS: New coarse interstitial infiltrates or edema throughout the right lung, and to less degree on the left predominately at the base. There is some patchy airspace opacities in the lung bases as well. Heart size and mediastinal contours are within normal limits. Aortic  Atherosclerosis (ICD10-170.0). No effusion. Visualized bones unremarkable. IMPRESSION: New asymmetric interstitial infiltrates or edema, right worse than left Electronically Signed   By: Lucrezia Europe M.D.   On: 05/29/2022 09:25       Assessment & Plan:   Problem List Items Addressed This Visit     Abnormal urinalysis    Was treated with levaquin.  Culture reviewed.  No urinary symptoms reported.  Check urine.        Anemia - Primary    Has seen hematology.  Recheck cbc today per discharge recommendation.       Atrial fibrillation ([place])    Noted during June hospitalization.  On amiodarone.  Saw Dr Fletcher Anon 05/19/22 - decreased metoprolol to 44m bid.  Appears to be in SR. Follow.       CAP (community acquired pneumonia)    Just admitted and cxr concerning for pneumonia.  Treated with IV abx and discharged on oral abx.  Plan for f/u cxr to confirm clear.  Breathing and mental status improved.       Chronic diastolic CHF (congestive heart failure) (HCC)    Received IV bolus during initial ER evaluation for  sepsis.  BNP approximately 500.  Was given dose of lasix.  Does not appear to be volume overloaded on exam today.  Follow.        COPD (chronic obstructive pulmonary disease) (HCC)    Continue duonebs, breo and rescue inhaler as needed.       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)    Continue aspirin, beta blocker and statin.  Currently stable.  Follow       Relevant Medications   mirtazapine (REMERON) 7.5 MG tablet   Hypercholesterolemia    Continue atorvastatin.  Follow lipid panel and liver function tests.        Hypertension    Blood pressure as outlined.  Continue metoprolol.  Follow pressures.  Follow metabolic panel.       Relevant Orders   CBC with Differential/Platelet (Completed)   Memory change    She has someone staying with her 24 hours per day now.  Continue f/u with neurology.  Home health to assess needs.  PT - for  strengthening.        Polymyalgia rheumatica syndrome ([place])    Followed by Dr Posey Pronto. Continue f/u with rheumatology. Continue MTX.       Sleep difficulties    Having issues with sleep as outlined.  Up and down all night. Affecting how she feels.  Off seroquel.  Lorazepam used in ER.  Want to avoid long term benzodiazepines.  Trial of remeron - low dose.  Follow closely.  Call with update.        Tenderness of left calf    Persistent increased tenderness.  Recent hospitalization.  Check ultrasound to confirm no clot.        Relevant Orders   US Venous Img Lower Unilateral Left (Completed)   Type 2 diabetes mellitus with cardiac complication (HCC)    On metformin.  Low carb diet.  Follow met b and a1c.       Relevant Orders   Urinalysis, Routine w reflex microscopic (Completed)   Basic metabolic panel (Completed)     Einar Pheasant, MD

## 2022-06-09 ENCOUNTER — Other Ambulatory Visit: Payer: Medicare Other

## 2022-06-09 ENCOUNTER — Other Ambulatory Visit: Payer: Self-pay

## 2022-06-09 DIAGNOSIS — R300 Dysuria: Secondary | ICD-10-CM | POA: Diagnosis not present

## 2022-06-12 ENCOUNTER — Encounter: Payer: Self-pay | Admitting: Internal Medicine

## 2022-06-12 DIAGNOSIS — G479 Sleep disorder, unspecified: Secondary | ICD-10-CM | POA: Insufficient documentation

## 2022-06-12 NOTE — Assessment & Plan Note (Signed)
Followed by Dr Allena Katz. Continue f/u with rheumatology. Continue MTX.

## 2022-06-12 NOTE — Assessment & Plan Note (Signed)
Has seen hematology.  Recheck cbc today per discharge recommendation.

## 2022-06-12 NOTE — Assessment & Plan Note (Signed)
Was treated with levaquin.  Culture reviewed.  No urinary symptoms reported.  Check urine.

## 2022-06-12 NOTE — Assessment & Plan Note (Signed)
Continue atorvastatin.  Follow lipid panel and liver function tests.   

## 2022-06-12 NOTE — Assessment & Plan Note (Signed)
Continue duonebs, breo and rescue inhaler as needed.  

## 2022-06-12 NOTE — Assessment & Plan Note (Signed)
Persistent increased tenderness.  Recent hospitalization.  Check ultrasound to confirm no clot.

## 2022-06-12 NOTE — Assessment & Plan Note (Signed)
Having issues with sleep as outlined.  Up and down all night. Affecting how she feels.  Off seroquel.  Lorazepam used in ER.  Want to avoid long term benzodiazepines.  Trial of remeron - low dose.  Follow closely.  Call with update.

## 2022-06-12 NOTE — Assessment & Plan Note (Signed)
Blood pressure as outlined.  Continue metoprolol.  Follow pressures.  Follow metabolic panel.  

## 2022-06-12 NOTE — Assessment & Plan Note (Signed)
Just admitted and cxr concerning for pneumonia.  Treated with IV abx and discharged on oral abx.  Plan for f/u cxr to confirm clear.  Breathing and mental status improved.

## 2022-06-12 NOTE — Assessment & Plan Note (Signed)
Continue aspirin, beta blocker and statin.  Currently stable.  Follow  

## 2022-06-12 NOTE — Assessment & Plan Note (Signed)
On metformin.  Low carb diet.  Follow met b and a1c.  

## 2022-06-12 NOTE — Assessment & Plan Note (Signed)
Received IV bolus during initial ER evaluation for sepsis.  BNP approximately 500.  Was given dose of lasix.  Does not appear to be volume overloaded on exam today.  Follow.

## 2022-06-12 NOTE — Assessment & Plan Note (Signed)
She has someone staying with her 24 hours per day now.  Continue f/u with neurology.  Home health to assess needs.  PT - for strengthening.

## 2022-06-12 NOTE — Assessment & Plan Note (Signed)
Noted during June hospitalization.  On amiodarone.  Saw Dr Arida 05/19/22 - decreased metoprolol to 25mg bid.  Appears to be in SR. Follow.  

## 2022-06-13 ENCOUNTER — Telehealth: Payer: Self-pay | Admitting: Internal Medicine

## 2022-06-13 NOTE — Telephone Encounter (Signed)
Tobi Bastos from [employer] calling about plan of care order from 8/8 and 8/11 for nursing and PT

## 2022-06-13 NOTE — Telephone Encounter (Signed)
re-faxed

## 2022-06-14 DIAGNOSIS — I110 Hypertensive heart disease with heart failure: Secondary | ICD-10-CM | POA: Diagnosis not present

## 2022-06-14 DIAGNOSIS — I5032 Chronic diastolic (congestive) heart failure: Secondary | ICD-10-CM | POA: Diagnosis not present

## 2022-06-14 DIAGNOSIS — I25118 Atherosclerotic heart disease of native coronary artery with other forms of angina pectoris: Secondary | ICD-10-CM | POA: Diagnosis not present

## 2022-06-14 DIAGNOSIS — J440 Chronic obstructive pulmonary disease with acute lower respiratory infection: Secondary | ICD-10-CM | POA: Diagnosis not present

## 2022-06-14 DIAGNOSIS — E119 Type 2 diabetes mellitus without complications: Secondary | ICD-10-CM | POA: Diagnosis not present

## 2022-06-14 LAB — URINE CULTURE
MICRO NUMBER:: 13761908
SPECIMEN QUALITY:: ADEQUATE

## 2022-06-15 DIAGNOSIS — E119 Type 2 diabetes mellitus without complications: Secondary | ICD-10-CM | POA: Diagnosis not present

## 2022-06-15 DIAGNOSIS — J440 Chronic obstructive pulmonary disease with acute lower respiratory infection: Secondary | ICD-10-CM | POA: Diagnosis not present

## 2022-06-15 DIAGNOSIS — I110 Hypertensive heart disease with heart failure: Secondary | ICD-10-CM | POA: Diagnosis not present

## 2022-06-15 DIAGNOSIS — I5032 Chronic diastolic (congestive) heart failure: Secondary | ICD-10-CM | POA: Diagnosis not present

## 2022-06-15 DIAGNOSIS — I25118 Atherosclerotic heart disease of native coronary artery with other forms of angina pectoris: Secondary | ICD-10-CM | POA: Diagnosis not present

## 2022-06-16 DIAGNOSIS — E119 Type 2 diabetes mellitus without complications: Secondary | ICD-10-CM | POA: Diagnosis not present

## 2022-06-16 DIAGNOSIS — I25118 Atherosclerotic heart disease of native coronary artery with other forms of angina pectoris: Secondary | ICD-10-CM | POA: Diagnosis not present

## 2022-06-16 DIAGNOSIS — J440 Chronic obstructive pulmonary disease with acute lower respiratory infection: Secondary | ICD-10-CM | POA: Diagnosis not present

## 2022-06-16 DIAGNOSIS — I5032 Chronic diastolic (congestive) heart failure: Secondary | ICD-10-CM | POA: Diagnosis not present

## 2022-06-16 DIAGNOSIS — I110 Hypertensive heart disease with heart failure: Secondary | ICD-10-CM | POA: Diagnosis not present

## 2022-06-18 DIAGNOSIS — E119 Type 2 diabetes mellitus without complications: Secondary | ICD-10-CM | POA: Diagnosis not present

## 2022-06-18 DIAGNOSIS — J440 Chronic obstructive pulmonary disease with acute lower respiratory infection: Secondary | ICD-10-CM | POA: Diagnosis not present

## 2022-06-18 DIAGNOSIS — I110 Hypertensive heart disease with heart failure: Secondary | ICD-10-CM | POA: Diagnosis not present

## 2022-06-18 DIAGNOSIS — I25118 Atherosclerotic heart disease of native coronary artery with other forms of angina pectoris: Secondary | ICD-10-CM | POA: Diagnosis not present

## 2022-06-18 DIAGNOSIS — I5032 Chronic diastolic (congestive) heart failure: Secondary | ICD-10-CM | POA: Diagnosis not present

## 2022-06-20 DIAGNOSIS — I5032 Chronic diastolic (congestive) heart failure: Secondary | ICD-10-CM | POA: Diagnosis not present

## 2022-06-20 DIAGNOSIS — I110 Hypertensive heart disease with heart failure: Secondary | ICD-10-CM | POA: Diagnosis not present

## 2022-06-20 DIAGNOSIS — E119 Type 2 diabetes mellitus without complications: Secondary | ICD-10-CM | POA: Diagnosis not present

## 2022-06-20 DIAGNOSIS — J440 Chronic obstructive pulmonary disease with acute lower respiratory infection: Secondary | ICD-10-CM | POA: Diagnosis not present

## 2022-06-20 DIAGNOSIS — I25118 Atherosclerotic heart disease of native coronary artery with other forms of angina pectoris: Secondary | ICD-10-CM | POA: Diagnosis not present

## 2022-06-21 ENCOUNTER — Inpatient Hospital Stay: Payer: Medicare Other | Admitting: Internal Medicine

## 2022-06-22 ENCOUNTER — Ambulatory Visit: Payer: Self-pay

## 2022-06-22 DIAGNOSIS — G939 Disorder of brain, unspecified: Secondary | ICD-10-CM | POA: Diagnosis not present

## 2022-06-22 DIAGNOSIS — I5032 Chronic diastolic (congestive) heart failure: Secondary | ICD-10-CM | POA: Diagnosis not present

## 2022-06-22 DIAGNOSIS — E119 Type 2 diabetes mellitus without complications: Secondary | ICD-10-CM | POA: Diagnosis not present

## 2022-06-22 DIAGNOSIS — I25118 Atherosclerotic heart disease of native coronary artery with other forms of angina pectoris: Secondary | ICD-10-CM | POA: Diagnosis not present

## 2022-06-22 DIAGNOSIS — J440 Chronic obstructive pulmonary disease with acute lower respiratory infection: Secondary | ICD-10-CM | POA: Diagnosis not present

## 2022-06-22 DIAGNOSIS — G309 Alzheimer's disease, unspecified: Secondary | ICD-10-CM | POA: Diagnosis not present

## 2022-06-22 DIAGNOSIS — I110 Hypertensive heart disease with heart failure: Secondary | ICD-10-CM | POA: Diagnosis not present

## 2022-06-22 NOTE — Patient Outreach (Signed)
  Care Coordination   Follow Up Visit Note   06/22/2022 Name: Kathia Covington MRN: [id number] DOB: [birth year]  Brietta Manso Ordaz is a 88 y.o. year old female who sees Dale Wilmore, MD for primary care. I  spoke with patients sister, [name] today.  Patient has given verbal authorization to speak with her sister, Diane regarding her health information.  Unable to speak with patient today due to home health nurse working with her.   What matters to the patients health and wellness today?  Sister, Sedalia Muta states patient is doing well. She reports patient is receiving nursing and physical therapy services with home health.  She states patient was having difficulty with her sleep but states this has resolved since primary care provider prescribed medication. Sister states patient has a follow up visit with neurologist today and primary care provider on 07/08/22.  She states patient has strong family support to provide transportation and overall assistance with care.       Goals Addressed             This Visit's Progress    COMPLETED: Patient stated: " I just want to get better"       Care Coordination Interventions: Evaluation of current treatment plan since discharge from the skilled nursing facility  and patient's adherence to plan as established by provider Reviewed medications with patient and discussed importance of compliance Reviewed scheduled/upcoming provider appointments  Reviewed atrial fibrillation symptoms and when to call provider. Advised to call 911 for severe symptoms. Confirmed patient receiving home health services ( per sister patient is receiving nursing and physical therapy)          SDOH assessments and interventions completed:  No     Care Coordination Interventions Activated:  Yes  Care Coordination Interventions:  Yes, provided   Follow up plan: No further intervention required.   Encounter Outcome:  Pt. Visit Completed   George Ina  RN,BSN,CCM RN Care Manager Coordinator [phone]

## 2022-06-22 NOTE — Patient Instructions (Addendum)
Visit Information  Thank you for taking time to visit with me today. Please don't hesitate to contact me if I can be of assistance to you.   Following are the goals we discussed today:   Goals Addressed             This Visit's Progress    COMPLETED: Patient stated: " I just want to get better"       Care Coordination Interventions: Evaluation of current treatment plan since discharge from the skilled nursing facility  and patient's adherence to plan as established by provider Reviewed medications with patient and discussed importance of compliance Reviewed scheduled/upcoming provider appointments  Reviewed atrial fibrillation symptoms and when to call provider. Advised to call 911 for severe symptoms. Confirmed patient receiving home health services ( per sister patient is receiving nursing and physical therapy)           If you are experiencing a Mental Health or [place] or need someone to talk to, please call the Suicide and Crisis Lifeline: 988 call 1-800-273-TALK (toll free, 24 hour hotline)  The patient verbalized understanding of instructions, educational materials, and care plan provided today and DECLINED offer to receive copy of patient instructions, educational materials, and care plan.   No further follow up required: goals met

## 2022-06-23 DIAGNOSIS — E119 Type 2 diabetes mellitus without complications: Secondary | ICD-10-CM | POA: Diagnosis not present

## 2022-06-23 DIAGNOSIS — I110 Hypertensive heart disease with heart failure: Secondary | ICD-10-CM | POA: Diagnosis not present

## 2022-06-23 DIAGNOSIS — I5032 Chronic diastolic (congestive) heart failure: Secondary | ICD-10-CM | POA: Diagnosis not present

## 2022-06-23 DIAGNOSIS — J440 Chronic obstructive pulmonary disease with acute lower respiratory infection: Secondary | ICD-10-CM | POA: Diagnosis not present

## 2022-06-23 DIAGNOSIS — I25118 Atherosclerotic heart disease of native coronary artery with other forms of angina pectoris: Secondary | ICD-10-CM | POA: Diagnosis not present

## 2022-06-27 DIAGNOSIS — I110 Hypertensive heart disease with heart failure: Secondary | ICD-10-CM | POA: Diagnosis not present

## 2022-06-27 DIAGNOSIS — E119 Type 2 diabetes mellitus without complications: Secondary | ICD-10-CM | POA: Diagnosis not present

## 2022-06-27 DIAGNOSIS — I25118 Atherosclerotic heart disease of native coronary artery with other forms of angina pectoris: Secondary | ICD-10-CM | POA: Diagnosis not present

## 2022-06-27 DIAGNOSIS — J440 Chronic obstructive pulmonary disease with acute lower respiratory infection: Secondary | ICD-10-CM | POA: Diagnosis not present

## 2022-06-27 DIAGNOSIS — I5032 Chronic diastolic (congestive) heart failure: Secondary | ICD-10-CM | POA: Diagnosis not present

## 2022-06-30 DIAGNOSIS — I5032 Chronic diastolic (congestive) heart failure: Secondary | ICD-10-CM | POA: Diagnosis not present

## 2022-06-30 DIAGNOSIS — I25118 Atherosclerotic heart disease of native coronary artery with other forms of angina pectoris: Secondary | ICD-10-CM | POA: Diagnosis not present

## 2022-06-30 DIAGNOSIS — E119 Type 2 diabetes mellitus without complications: Secondary | ICD-10-CM | POA: Diagnosis not present

## 2022-06-30 DIAGNOSIS — J440 Chronic obstructive pulmonary disease with acute lower respiratory infection: Secondary | ICD-10-CM | POA: Diagnosis not present

## 2022-06-30 DIAGNOSIS — I110 Hypertensive heart disease with heart failure: Secondary | ICD-10-CM | POA: Diagnosis not present

## 2022-07-05 DIAGNOSIS — I25118 Atherosclerotic heart disease of native coronary artery with other forms of angina pectoris: Secondary | ICD-10-CM | POA: Diagnosis not present

## 2022-07-05 DIAGNOSIS — J440 Chronic obstructive pulmonary disease with acute lower respiratory infection: Secondary | ICD-10-CM | POA: Diagnosis not present

## 2022-07-05 DIAGNOSIS — I5032 Chronic diastolic (congestive) heart failure: Secondary | ICD-10-CM | POA: Diagnosis not present

## 2022-07-05 DIAGNOSIS — I110 Hypertensive heart disease with heart failure: Secondary | ICD-10-CM | POA: Diagnosis not present

## 2022-07-05 DIAGNOSIS — E119 Type 2 diabetes mellitus without complications: Secondary | ICD-10-CM | POA: Diagnosis not present

## 2022-07-06 DIAGNOSIS — M130 Polyarthritis, unspecified: Secondary | ICD-10-CM

## 2022-07-06 DIAGNOSIS — J440 Chronic obstructive pulmonary disease with acute lower respiratory infection: Secondary | ICD-10-CM | POA: Diagnosis not present

## 2022-07-06 DIAGNOSIS — E7800 Pure hypercholesterolemia, unspecified: Secondary | ICD-10-CM | POA: Diagnosis not present

## 2022-07-06 DIAGNOSIS — Z9181 History of falling: Secondary | ICD-10-CM

## 2022-07-06 DIAGNOSIS — I5032 Chronic diastolic (congestive) heart failure: Secondary | ICD-10-CM | POA: Diagnosis not present

## 2022-07-06 DIAGNOSIS — I110 Hypertensive heart disease with heart failure: Secondary | ICD-10-CM | POA: Diagnosis not present

## 2022-07-06 DIAGNOSIS — Z7984 Long term (current) use of oral hypoglycemic drugs: Secondary | ICD-10-CM

## 2022-07-06 DIAGNOSIS — I700 Atherosclerosis of aorta: Secondary | ICD-10-CM | POA: Diagnosis not present

## 2022-07-06 DIAGNOSIS — I480 Paroxysmal atrial fibrillation: Secondary | ICD-10-CM | POA: Diagnosis not present

## 2022-07-06 DIAGNOSIS — M353 Polymyalgia rheumatica: Secondary | ICD-10-CM | POA: Diagnosis not present

## 2022-07-06 DIAGNOSIS — M810 Age-related osteoporosis without current pathological fracture: Secondary | ICD-10-CM | POA: Diagnosis not present

## 2022-07-06 DIAGNOSIS — J188 Other pneumonia, unspecified organism: Secondary | ICD-10-CM | POA: Diagnosis not present

## 2022-07-06 DIAGNOSIS — I25118 Atherosclerotic heart disease of native coronary artery with other forms of angina pectoris: Secondary | ICD-10-CM | POA: Diagnosis not present

## 2022-07-06 DIAGNOSIS — G9341 Metabolic encephalopathy: Secondary | ICD-10-CM | POA: Diagnosis not present

## 2022-07-06 DIAGNOSIS — E119 Type 2 diabetes mellitus without complications: Secondary | ICD-10-CM | POA: Diagnosis not present

## 2022-07-08 ENCOUNTER — Ambulatory Visit (INDEPENDENT_AMBULATORY_CARE_PROVIDER_SITE_OTHER): Payer: Medicare Other | Admitting: Internal Medicine

## 2022-07-08 ENCOUNTER — Encounter: Payer: Self-pay | Admitting: Internal Medicine

## 2022-07-08 VITALS — BP 138/68 | HR 64 | Temp 98.6°F | Ht 61.0 in | Wt 130.8 lb

## 2022-07-08 DIAGNOSIS — E1159 Type 2 diabetes mellitus with other circulatory complications: Secondary | ICD-10-CM | POA: Diagnosis not present

## 2022-07-08 DIAGNOSIS — E7800 Pure hypercholesterolemia, unspecified: Secondary | ICD-10-CM | POA: Diagnosis not present

## 2022-07-08 DIAGNOSIS — I5032 Chronic diastolic (congestive) heart failure: Secondary | ICD-10-CM

## 2022-07-08 DIAGNOSIS — M810 Age-related osteoporosis without current pathological fracture: Secondary | ICD-10-CM

## 2022-07-08 DIAGNOSIS — J449 Chronic obstructive pulmonary disease, unspecified: Secondary | ICD-10-CM | POA: Diagnosis not present

## 2022-07-08 DIAGNOSIS — E538 Deficiency of other specified B group vitamins: Secondary | ICD-10-CM

## 2022-07-08 DIAGNOSIS — R9389 Abnormal findings on diagnostic imaging of other specified body structures: Secondary | ICD-10-CM | POA: Diagnosis not present

## 2022-07-08 DIAGNOSIS — D509 Iron deficiency anemia, unspecified: Secondary | ICD-10-CM | POA: Diagnosis not present

## 2022-07-08 DIAGNOSIS — I10 Essential (primary) hypertension: Secondary | ICD-10-CM | POA: Diagnosis not present

## 2022-07-08 DIAGNOSIS — I4891 Unspecified atrial fibrillation: Secondary | ICD-10-CM | POA: Diagnosis not present

## 2022-07-08 DIAGNOSIS — I25118 Atherosclerotic heart disease of native coronary artery with other forms of angina pectoris: Secondary | ICD-10-CM | POA: Diagnosis not present

## 2022-07-08 MED ORDER — MIRTAZAPINE 7.5 MG PO TABS: ORAL_TABLET | ORAL | 1 refills | Status: DC

## 2022-07-08 MED ORDER — IPRATROPIUM-ALBUTEROL 0.5-2.5 (3) MG/3ML IN SOLN: 3.0000 mL | Freq: Two times a day (BID) | RESPIRATORY_TRACT | 2 refills | Status: DC | PRN

## 2022-07-08 MED ORDER — CYANOCOBALAMIN 1000 MCG/ML IJ SOLN
1000.0000 ug | Freq: Once | INTRAMUSCULAR | Status: AC
  Administered 2022-07-08: 1000 ug via INTRAMUSCULAR

## 2022-07-08 MED ORDER — AMLODIPINE BESYLATE 2.5 MG PO TABS: 2.5000 mg | ORAL_TABLET | Freq: Every day | ORAL | 2 refills | Status: DC

## 2022-07-08 NOTE — Progress Notes (Unsigned)
[name], female   DOB: 31-Jul-1933, 88 y.o.   MRN: [id number]   Subjective:    [name], female    DOB: Oct 09, 1933, 88 y.o.   MRN: [id number]   Patient here for  Chief Complaint  Patient presents with   Follow-up    7 week follow up.  Monica, daughter in law present with patient.  No complaints or concerns voiced.    Marland Kitchen   HPI Here to follow up regarding hypertension, afib, diabtes and copd.  She is accompanied by daughter in law [place].  History obtained from both of them.  Breathing stable.  No increased cough or congestion.  Diagnosed with mixed dementia.  Started on aricept.  Continue aspirin.  No headache.  No chest pain.  No acid reflux or abdominal pain.  Bowels stable.  Family supportive and staying with her.    Past Medical History:  Diagnosis Date   (HFpEF) heart failure with preserved ejection fraction ([place])    a. TTE 12/19: EF 55-60%, probable HK of the mid apical anterior septal myocardium, Gr1DD, mild AI, mildly dilated LA; b.07/2020 Echo: EF 60-65%, no rwma, Gr2 DD. Nl RV fxn. Mildly dil LA. Mild MR; c. 10/2021 Echo: EF 60-65%, no rwma, mild LVH, nl RV fxn, mod elev PASP, mildly dil LA, mild MR, Ao sclerosis.   Asthma    CAD (coronary artery disease)    a. 09/2018 NSTEMI/PCI: LM min irregs, mLAD 95 (PCI/DES), mLAD-2 60%, LCx mild diff dzs, RCA min irregs; b. 03/2020 MV: EF>65%, no ischemia/scar; c. 07/2020 Cath: LM min irregs, LAD 30p, 41m 90d, D1/2 min irregs, LCX diff dzs throughout, OM1/2/3 mild dzs, RCA 30p, RPDA/RPAV min irrges. EF 55-65%.   CHF (congestive heart failure) ([facility]    Diabetes mellitus ([facility]    Hypercholesterolemia    Hypertension    Myocardial infarction ([facility]    Osteopenia    Palpitations    a. 04/2020 Zio: Avg HR 75. 429 SVT episodes, longest 19 secs @ 133. Occas PACs (3.2%). Rare PVCs (<1%).   Polymyalgia rheumatica syndrome (HCC)    Reactive airway disease    Severe sepsis ([facility]    Past Surgical History:   Procedure Laterality Date   ABDOMINAL HYSTERECTOMY  11/01/1979   prolapse and bleeding, ovaries not removed   BREAST EXCISIONAL BIOPSY Right    CATARACT EXTRACTION Right    CHOLECYSTECTOMY N/A 09/02/2019   Procedure: LAPAROSCOPIC CHOLECYSTECTOMY WITH INTRAOPERATIVE CHOLANGIOGRAM;  Surgeon: POlean Ree MD;  Location: [facility] OR[facility];  Service: General;  Laterality: N/A;   CORONARY STENT INTERVENTION N/A 10/08/2018   Procedure: CORONARY STENT INTERVENTION;  Surgeon: AWellington Hampshire MD;  [facility];  Service: Cardiovascular;  Laterality: N/A;   ENDOSCOPIC RETROGRADE CHOLANGIOPANCREATOGRAPHY (ERCP) WITH PROPOFOL N/A 08/08/2019   Procedure: ENDOSCOPIC RETROGRADE CHOLANGIOPANCREATOGRAPHY (ERCP) WITH PROPOFOL;  Surgeon: WLucilla Lame MD;  Location: [facility];  Service: Endoscopy;  Laterality: N/A;   LEFT HEART CATH AND CORONARY ANGIOGRAPHY N/A 10/08/2018   Procedure: LEFT HEART CATH AND CORONARY ANGIOGRAPHY;  Surgeon: AWellington Hampshire MD;  [facility];  Service: Cardiovascular;  Laterality: N/A;   LEFT HEART CATH AND CORONARY ANGIOGRAPHY N/A 08/10/2020   Procedure: LEFT HEART CATH AND CORONARY ANGIOGRAPHY possible percutaneous intervention;  Surgeon: AWellington Hampshire MD;  [facility];  Service: Cardiovascular;  Laterality: N/A;   UMBILICAL HERNIA REPAIR  04/30/1993   Family History  Problem Relation Age of Onset  Arthritis Mother    Heart disease Mother    Heart attack Father    Throat cancer Sister    Parkinson's disease Sister    COPD Brother    COPD Brother    Social History   Socioeconomic History   Marital status: Widowed    Spouse name: Not on file   Number of children: 3   Years of education: Not on file   Highest education level: Not on file  Occupational History   Not on file  Tobacco Use   Smoking status: Never   Smokeless tobacco: Never  Vaping Use   Vaping Use: Never used  Substance and Sexual Activity    Alcohol use: No    Alcohol/week: 0.0 standard drinks of alcohol   Drug use: No   Sexual activity: Not Currently  Other Topics Concern   Not on file  Social History Narrative   No smoking; no alcohol; in [place]; worked in [employer] fund[employer]. Lives by self in [place]. Does all of her own housework. Dtr does food shopping for her.   Social Determinants of Health   Financial Resource Strain: Low Risk  (05/27/2022)   Overall Financial Resource Strain (CARDIA)    Difficulty of Paying Living Expenses: Not hard at all  Food Insecurity: No Food Insecurity (05/27/2022)   Hunger Vital Sign    Worried About Running Out of Food in the Last Year: Never true    Ran Out of Food in the Last Year: Never true  Transportation Needs: No Transportation Needs (05/27/2022)   PRAPARE - Hydrologist (Medical): No    Lack of Transportation (Non-Medical): No  Physical Activity: Insufficiently Active (01/07/2019)   Exercise Vital Sign    Days of Exercise per Week: 2 days    Minutes of Exercise per Session: 10 min  Stress: No Stress Concern Present (05/27/2022)   [place]    Feeling of Stress : Not at all  Social Connections: Moderately Isolated (05/27/2022)   Social Connection and Isolation Panel [NHANES]    Frequency of Communication with Friends and Family: More than three times a week    Frequency of Social Gatherings with Friends and Family: More than three times a week    Attends Religious Services: More than 4 times per year    Active Member of [employer] or Organizations: No    Attends Archivist Meetings: Never    Marital Status: Widowed     Review of Systems  Constitutional:  Negative for appetite change and unexpected weight change.  HENT:  Negative for congestion and sinus pressure.   Respiratory:  Negative for cough and chest tightness.        Breathing stable   Cardiovascular:  Negative for  chest pain, palpitations and leg swelling.  Gastrointestinal:  Negative for abdominal pain, diarrhea, nausea and vomiting.  Genitourinary:  Negative for difficulty urinating and dysuria.  Musculoskeletal:  Negative for joint swelling and myalgias.  Skin:  Negative for color change and rash.  Neurological:  Negative for dizziness and headaches.  Psychiatric/Behavioral:  Negative for agitation and dysphoric mood.        Objective:     BP 138/68   Pulse 64   Temp 98.6 F (37 C) (Oral)   Ht _0  (1.549 m)   Wt 130 lb 12.8 oz (59.3 kg)   SpO2 97%   BMI 24.71 kg/m  Wt Readings from Last 3 Encounters:  07/08/22 130 lb 12.8 oz (59.3 kg)  06/08/22 127 lb 9.6 oz (57.9 kg)  05/29/22 138 lb 10.7 oz (62.9 kg)    Physical Exam Vitals reviewed.  Constitutional:      General: She is not in acute distress.    Appearance: Normal appearance.  HENT:     Head: Normocephalic and atraumatic.     Right Ear: External ear normal.     Left Ear: External ear normal.  Eyes:     General: No scleral icterus.       Right eye: No discharge.        Left eye: No discharge.     Conjunctiva/sclera: Conjunctivae normal.  Neck:     Thyroid: No thyromegaly.  Cardiovascular:     Rate and Rhythm: Normal rate and regular rhythm.  Pulmonary:     Effort: No respiratory distress.     Breath sounds: Normal breath sounds. No wheezing.  Abdominal:     General: Bowel sounds are normal.     Palpations: Abdomen is soft.     Tenderness: There is no abdominal tenderness.  Musculoskeletal:        General: No swelling or tenderness.     Cervical back: Neck supple. No tenderness.  Lymphadenopathy:     Cervical: No cervical adenopathy.  Skin:    Findings: No erythema or rash.  Neurological:     Mental Status: She is alert.  Psychiatric:        Mood and Affect: Mood normal.        Behavior: Behavior normal.      Outpatient Encounter Medications as of 07/08/2022  Medication Sig   acetaminophen (TYLENOL)  500 MG tablet Take 500-1,000 mg by mouth every 6 (six) hours as needed for mild pain or moderate pain.   amiodarone (PACERONE) 200 MG tablet Take 200 mg by mouth daily.   amLODipine (NORVASC) 2.5 MG tablet Take 1 tablet (2.5 mg total) by mouth daily.   aspirin 81 MG tablet Take 81 mg by mouth daily.   atorvastatin (LIPITOR) 80 MG tablet TAKE 1 TABLET BY MOUTH ONCE DAILY AT  6PM   donepezil (ARICEPT) 5 MG tablet Take by mouth.   fluticasone-salmeterol (ADVAIR) 250-50 MCG/ACT AEPB Inhale 1 puff into the lungs in the morning and at bedtime.   folic acid (FOLVITE) 1 MG tablet Take 1 mg by mouth daily.   furosemide (LASIX) 40 MG tablet Take 1 tablet (40 mg total) by mouth daily as needed (weight gain 5 lbs over 1-2 days, lower extremity swelling, shortness of breath. If medication does not resolve symptoms in 2 days please call Dr / 911).   metFORMIN (GLUCOPHAGE) 500 MG tablet TAKE 1 TABLET BY MOUTH TWICE DAILY WITH A MEAL   methotrexate (RHEUMATREX) 2.5 MG tablet Take 12.5 mg by mouth every Saturday.   metoprolol tartrate (LOPRESSOR) 25 MG tablet Take 1 tablet (25 mg total) by mouth 2 (two) times daily.   Multiple Vitamins-Minerals (PRESERVISION AREDS 2) CAPS Take 1 capsule by mouth 2 (two) times daily.   nitroGLYCERIN (NITROSTAT) 0.4 MG SL tablet Place 1 tablet (0.4 mg total) under the tongue every 5 (five) minutes as needed for chest pain.   ondansetron (ZOFRAN-ODT) 4 MG disintegrating tablet Take 4 mg by mouth 3 (three) times daily as needed for nausea or vomiting.   PROAIR HFA 108 (90 Base) MCG/ACT inhaler Inhale 2 puffs into the lungs every 4 (four) hours as needed for wheezing or shortness of breath.   Vitamin  D, Ergocalciferol, (DRISDOL) 50000 units CAPS capsule Take 50,000 Units by mouth every Friday.   [DISCONTINUED] ipratropium-albuterol (DUONEB) 0.5-2.5 (3) MG/3ML SOLN Take 3 mLs by nebulization 2 (two) times daily. (Patient taking differently: Take 3 mLs by nebulization 2 (two) times daily  as needed (shortness of breath or wheezing).)   [DISCONTINUED] mirtazapine (REMERON) 7.5 MG tablet 1/2 tablet q hs prn   ipratropium-albuterol (DUONEB) 0.5-2.5 (3) MG/3ML SOLN Take 3 mLs by nebulization 2 (two) times daily as needed (shortness of breath or wheezing).   mirtazapine (REMERON) 7.5 MG tablet 1/2 tablet q hs prn   [EXPIRED] cyanocobalamin (VITAMIN B12) injection 1,000 mcg    No facility-administered encounter medications on file as of 07/08/2022.     Lab Results  Component Value Date   WBC 11.5 (H) 06/08/2022   HGB 11.2 (L) 06/08/2022   HCT 34.9 (L) 06/08/2022   PLT 330.0 06/08/2022   GLUCOSE 109 (H) 06/08/2022   CHOL 112 01/26/2022   TRIG 137 01/26/2022   HDL 35 (L) 01/26/2022   LDLDIRECT 136.0 07/03/2020   LDLCALC 50 01/26/2022   ALT 13 05/29/2022   AST 16 05/29/2022   NA 139 06/08/2022   K 4.3 06/08/2022   CL 106 06/08/2022   CREATININE 0.71 06/08/2022   BUN 19 06/08/2022   CO2 21 06/08/2022   TSH 2.064 05/07/2022   INR 1.1 05/29/2022   HGBA1C 8.0 (H) 01/25/2022   MICROALBUR 1.1 07/23/2021    US Venous Img Lower Unilateral Left  Result Date: 06/08/2022 CLINICAL DATA:  Left calf tenderness. EXAM: LEFT LOWER EXTREMITY VENOUS DOPPLER ULTRASOUND TECHNIQUE: Gray-scale sonography with compression, as well as color and duplex ultrasound, were performed to evaluate the deep venous system(s) from the level of the common femoral vein through the popliteal and proximal calf veins. COMPARISON:  None Available. FINDINGS: VENOUS Normal compressibility of the common femoral, superficial femoral, and popliteal veins, as well as the visualized calf veins. Visualized portions of profunda femoral vein and great saphenous vein unremarkable. No filling defects to suggest DVT on grayscale or color Doppler imaging. Doppler waveforms show normal direction of venous flow, normal respiratory plasticity and response to augmentation. Limited views of the contralateral common femoral vein are  unremarkable. OTHER None. Limitations: None. IMPRESSION: Negative. Electronically Signed   By: Titus Dubin M.D.   On: 06/08/2022 13:28       Assessment & Plan:   Problem List Items Addressed This Visit     Abnormal CXR    Previous infiltrate.  Plan for repeat cxr next visit.       Anemia    Has seen hematology.  Follow cbc.       Atrial fibrillation ([place])    Noted during June hospitalization.  On amiodarone.  Saw Dr Fletcher Anon 05/19/22 - decreased metoprolol to 100m bid.  Appears to be in SR. Follow.       Relevant Medications   amLODipine (NORVASC) 2.5 MG tablet   Chronic diastolic CHF (congestive heart failure) (HCC)    No evidence of volume overload on exam.  Continues metoprolol.  Follow.       Relevant Medications   amLODipine (NORVASC) 2.5 MG tablet   COPD (chronic obstructive pulmonary disease) (HCC)    Continue duonebs, breo and rescue inhaler as needed.       Relevant Medications   ipratropium-albuterol (DUONEB) 0.5-2.5 (3) MG/3ML SOLN   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)    Continue aspirin, beta blocker and  statin.  Currently stable.  Follow       Relevant Medications   mirtazapine (REMERON) 7.5 MG tablet   amLODipine (NORVASC) 2.5 MG tablet   Hypercholesterolemia    Continue atorvastatin.  Follow lipid panel and liver function tests.        Relevant Medications   amLODipine (NORVASC) 2.5 MG tablet   Hypertension    Blood pressure as outlined.  Recheck improved. Continue metoprolol and amlodipine.  Follow pressures.  Hold on making any changes today. Follow metabolic panel.       Relevant Medications   amLODipine (NORVASC) 2.5 MG tablet   Osteoporosis     Completed alendronate 5 years - 02/2022.       Type 2 diabetes mellitus with cardiac complication (HCC)    On metformin.  Low carb diet.  Follow met b and a1c.       Relevant Medications   amLODipine (NORVASC) 2.5 MG tablet   Other Visit Diagnoses      B12 deficiency    -  Primary   Relevant Medications   cyanocobalamin (VITAMIN B12) injection 1,000 mcg (Completed)        Einar Pheasant, MD

## 2022-07-10 ENCOUNTER — Encounter: Payer: Self-pay | Admitting: Internal Medicine

## 2022-07-10 NOTE — Assessment & Plan Note (Signed)
On metformin.  Low carb diet.  Follow met b and a1c.  

## 2022-07-10 NOTE — Assessment & Plan Note (Signed)
Continue aspirin, beta blocker and statin.  Currently stable.  Follow  

## 2022-07-10 NOTE — Assessment & Plan Note (Signed)
Previous infiltrate.  Plan for repeat cxr next visit.

## 2022-07-10 NOTE — Assessment & Plan Note (Signed)
Continue atorvastatin.  Follow lipid panel and liver function tests.   

## 2022-07-10 NOTE — Assessment & Plan Note (Signed)
Completed alendronate 5 years - 02/2022.  

## 2022-07-10 NOTE — Assessment & Plan Note (Signed)
Continue duonebs, breo and rescue inhaler as needed.  

## 2022-07-10 NOTE — Assessment & Plan Note (Signed)
No evidence of volume overload on exam.  Continues metoprolol.  Follow.  

## 2022-07-10 NOTE — Assessment & Plan Note (Addendum)
Blood pressure as outlined.  Recheck improved. Continue metoprolol and amlodipine.  Follow pressures.  Hold on making any changes today. Follow metabolic panel.  

## 2022-07-10 NOTE — Assessment & Plan Note (Signed)
Noted during June hospitalization.  On amiodarone.  Saw Dr Arida 05/19/22 - decreased metoprolol to 25mg bid.  Appears to be in SR. Follow.  

## 2022-07-10 NOTE — Assessment & Plan Note (Signed)
Has seen hematology.  Follow cbc.  

## 2022-07-11 ENCOUNTER — Encounter: Payer: Self-pay | Admitting: Physician Assistant

## 2022-07-11 ENCOUNTER — Ambulatory Visit: Payer: Medicare Other | Attending: Physician Assistant | Admitting: Physician Assistant

## 2022-07-11 VITALS — BP 130/70 | HR 65 | Ht 61.0 in | Wt 132.0 lb

## 2022-07-11 DIAGNOSIS — I480 Paroxysmal atrial fibrillation: Secondary | ICD-10-CM | POA: Diagnosis not present

## 2022-07-11 DIAGNOSIS — E785 Hyperlipidemia, unspecified: Secondary | ICD-10-CM | POA: Diagnosis not present

## 2022-07-11 DIAGNOSIS — G309 Alzheimer's disease, unspecified: Secondary | ICD-10-CM | POA: Diagnosis not present

## 2022-07-11 DIAGNOSIS — F0150 Vascular dementia without behavioral disturbance: Secondary | ICD-10-CM

## 2022-07-11 DIAGNOSIS — I2510 Atherosclerotic heart disease of native coronary artery without angina pectoris: Secondary | ICD-10-CM

## 2022-07-11 DIAGNOSIS — F0280 Dementia in other diseases classified elsewhere without behavioral disturbance: Secondary | ICD-10-CM

## 2022-07-11 DIAGNOSIS — I10 Essential (primary) hypertension: Secondary | ICD-10-CM | POA: Diagnosis not present

## 2022-07-11 NOTE — Progress Notes (Signed)
Cardiology Office Note    Date:  07/11/2022   ID:  [name] [birth year], MRN [id number]  PCP:  Dale , MD  Cardiologist:  Lorine Bears, MD  Electrophysiologist:  None   Chief Complaint: Hospital follow-up  History of Present Illness:   Tanya Cole is a 88 y.o. female with history of CAD, PAF not on anticoagulation given hallucinations, atrial tachycardia, mixed Alzheimer's and vascular dementia, COPD in the setting of extensive secondhand tobacco exposure and working in a Medical laboratory scientific officer for 29 years, DM, acute blood loss anemia, HTN, HLD, and osteoporosis who presents for hospital follow-up.  She was admitted in 09/2018 with an NSTEMI. LHC showed 95% mid LAD stenosis along with 60% distal LAD stenosis. She underwent successful PCI/DES to the mid LAD. Echo at that time showed an EF of 55-60%, probably hypokinesis of the mid-apicalanteroseptal myocardium, Gr1DD, mild mitral regurgitation, and a mildly dilated left atrium. She was diagnosed with acute blood loss anemia in 2021, leading to the discontinuation of Plavix. Outpatient cardiac monitoring in 03/2020 showed NSR with an average heart rate of 75 bpm. There were 429 episodes of atrial tachycardia with the longest episode lasting 19 seconds with a rate of 133 bpm. Occasional PACs with a burden of 3.2% and rare PVCs with a burden of < 1%. She was readmitted in 07/2020 with a small NSTEMI. LHC showed significant 1-vessel CAD with patent proximal LAD stent with mild ISR, stable mid LAD stenosis at 60%, and significant distal LAD stenosis at 90% close to the apex. EF was normal with a mildly elevated LVEDP. The distal LAD was felt to be the culprit, but was not amenable to PCI given distal location. Echo at that time showed an EF of 60-65%, no RWMA, Gr2DD, RVSF normal with normal ventricular cavity size, mildly dilated left atrium, and mild mitral regurgitation. She returned to the ED several weeks later with chest pain and  SOB with HS-Tn being normal. She was noted to have intermittent episodes of atrial tachycardia and frequent PACs. Her metoprolol was titrated.  She was hospitalized in 05/2021 with pleuritic chest pain in the setting of COVID-19 infection.  Echo during admission showed an EF of 55 to 60% with mild to moderate mitral regurgitation.  She was admitted to the hospital in 12/2021 with shortness of breath and wheezing.  WBC count was elevated and troponin was borderline elevated at 20 without a trend.  Symptoms were felt to be atypical.  Primary cardiologist recommended medical therapy.  She was admitted in 03/2022 with altered mental status and delirium.  She was started on Seroquel by psychiatry.  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  Prednisone was discontinued.  She was admitted in 04/2022 with auditory and visual hallucinations.  She was found to have a mild leukocytosis.  She was in sinus rhythm on presentation, though developed A-fib with RVR during her admission.  She was placed on IV diltiazem and subsequently treated with amiodarone.  She was deemed to not be a candidate for coagulation.  She was last seen in the office in 04/2022 with noted improved hallucinations.  She reported 1 episode of chest pain that did not last long and was unable to give other details.  Metoprolol was decreased to 25 mg twice daily.  She was admitted to the hospital from 7/30 through 05/31/2022 with pneumonia and chronic CHF.  BNP 544.  COVID-negative.  Chest x-ray showed new asymmetric interstitial infiltrates or edema with  the right being worse than the left.  She was treated for community-acquired pneumonia by the internal medicine service, given IV fluids followed by Lasix.  She has subsequently been evaluated by neurology and diagnosed with a mixed Alzheimer's and vascular dementia.  She comes in accompanied by her daughter today and is doing well from a cardiac perspective.  She is without  symptoms of angina or decompensation.  No dizziness, palpitations, presyncope, or syncope.  No significant lower extremity swelling, abdominal distention, or orthopnea.  No falls or symptoms concerning for bleeding.  She has only needed as needed furosemide 1 time since her hospital discharge in early August.  Blood pressure has been well controlled following the addition of amlodipine.  No further hallucinations.  During the week, her sister stays with her and on the weekends her daughter/family stay with her as she now requires 24-hour care.  Overall, the patient and daughter think the patient is doing well from a cardiac perspective.   Labs independently reviewed: 05/2022 - potassium 4.3, BUN 19, serum creatinine 0.71, WBC 11.5, Hgb 11.2, PLT 330 04/2022 - albumin 3.4, AST/ALT normal, TSH normal 03/2022 - magnesium 2.0 12/2021 - TC 112, TG 137, HDL 35, LDL 50  Past Medical History:  Diagnosis Date   (HFpEF) heart failure with preserved ejection fraction (HCC)    a. TTE 12/19: EF 55-60%, probable HK of the mid apical anterior septal myocardium, Gr1DD, mild AI, mildly dilated LA; b.07/2020 Echo: EF 60-65%, no rwma, Gr2 DD. Nl RV fxn. Mildly dil LA. Mild MR; c. 10/2021 Echo: EF 60-65%, no rwma, mild LVH, nl RV fxn, mod elev PASP, mildly dil LA, mild MR, Ao sclerosis.   Asthma    CAD (coronary artery disease)    a. 09/2018 NSTEMI/PCI: LM min irregs, mLAD 95 (PCI/DES), mLAD-2 60%, LCx mild diff dzs, RCA min irregs; b. 03/2020 MV: EF>65%, no ischemia/scar; c. 07/2020 Cath: LM min irregs, LAD 30p, 52m, 90d, D1/2 min irregs, LCX diff dzs throughout, OM1/2/3 mild dzs, RCA 30p, RPDA/RPAV min irrges. EF 55-65%.   CHF (congestive heart failure) (HCC)    Diabetes mellitus (HCC)    Hypercholesterolemia    Hypertension    Myocardial infarction (HCC)    Osteopenia    Palpitations    a. 04/2020 Zio: Avg HR 75. 429 SVT episodes, longest 19 secs @ 133. Occas PACs (3.2%). Rare PVCs (<1%).   Polymyalgia rheumatica  syndrome (HCC)    Reactive airway disease    Severe sepsis [facility])     Past Surgical History:  Procedure Laterality Date   ABDOMINAL HYSTERECTOMY  11/01/1979   prolapse and bleeding, ovaries not removed   BREAST EXCISIONAL BIOPSY Right    CATARACT EXTRACTION Right    CHOLECYSTECTOMY N/A 09/02/2019   Procedure: LAPAROSCOPIC CHOLECYSTECTOMY WITH INTRAOPERATIVE CHOLANGIOGRAM;  Surgeon: Henrene Dodge, MD;  Location: [facility] ORS;  Service: General;  Laterality: N/A;   CORONARY STENT INTERVENTION N/A 10/08/2018   Procedure: CORONARY STENT INTERVENTION;  Surgeon: Iran Ouch, MD;  Location: [facility] INVASIVE CV LAB;  Service: Cardiovascular;  Laterality: N/A;   ENDOSCOPIC RETROGRADE CHOLANGIOPANCREATOGRAPHY (ERCP) WITH PROPOFOL N/A 08/08/2019   Procedure: ENDOSCOPIC RETROGRADE CHOLANGIOPANCREATOGRAPHY (ERCP) WITH PROPOFOL;  Surgeon: Midge Minium, MD;  Location: [facility] ENDOSCOPY;  Service: Endoscopy;  Laterality: N/A;   LEFT HEART CATH AND CORONARY ANGIOGRAPHY N/A 10/08/2018   Procedure: LEFT HEART CATH AND CORONARY ANGIOGRAPHY;  Surgeon: Iran Ouch, MD;  Location: [facility] INVASIVE CV LAB;  Service: Cardiovascular;  Laterality: N/A;   LEFT HEART  CATH AND CORONARY ANGIOGRAPHY N/A 08/10/2020   Procedure: LEFT HEART CATH AND CORONARY ANGIOGRAPHY possible percutaneous intervention;  Surgeon: Wellington Hampshire, MD;  Location: [facility];  Service: Cardiovascular;  Laterality: N/A;   UMBILICAL HERNIA REPAIR  04/30/1993    Current Medications: Current Meds  Medication Sig   acetaminophen (TYLENOL) 500 MG tablet Take 500-1,000 mg by mouth every 6 (six) hours as needed for mild pain or moderate pain.   amiodarone (PACERONE) 200 MG tablet Take 200 mg by mouth daily.   amLODipine (NORVASC) 2.5 MG tablet Take 1 tablet (2.5 mg total) by mouth daily.   aspirin 81 MG tablet Take 81 mg by mouth daily.   atorvastatin (LIPITOR) 80 MG tablet TAKE 1 TABLET BY MOUTH ONCE DAILY AT  6PM   donepezil  (ARICEPT) 5 MG tablet Take by mouth.   fluticasone-salmeterol (ADVAIR) 250-50 MCG/ACT AEPB Inhale 1 puff into the lungs in the morning and at bedtime.   folic acid (FOLVITE) 1 MG tablet Take 1 mg by mouth daily.   furosemide (LASIX) 40 MG tablet Take 1 tablet (40 mg total) by mouth daily as needed (weight gain 5 lbs over 1-2 days, lower extremity swelling, shortness of breath. If medication does not resolve symptoms in 2 days please call Dr / 911).   ipratropium-albuterol (DUONEB) 0.5-2.5 (3) MG/3ML SOLN Take 3 mLs by nebulization 2 (two) times daily as needed (shortness of breath or wheezing).   metFORMIN (GLUCOPHAGE) 500 MG tablet TAKE 1 TABLET BY MOUTH TWICE DAILY WITH A MEAL   methotrexate (RHEUMATREX) 2.5 MG tablet Take 12.5 mg by mouth every Saturday.   metoprolol tartrate (LOPRESSOR) 25 MG tablet Take 1 tablet (25 mg total) by mouth 2 (two) times daily.   mirtazapine (REMERON) 7.5 MG tablet 1/2 tablet q hs prn   Multiple Vitamins-Minerals (PRESERVISION AREDS 2) CAPS Take 1 capsule by mouth 2 (two) times daily.   nitroGLYCERIN (NITROSTAT) 0.4 MG SL tablet Place 1 tablet (0.4 mg total) under the tongue every 5 (five) minutes as needed for chest pain.   ondansetron (ZOFRAN-ODT) 4 MG disintegrating tablet Take 4 mg by mouth 3 (three) times daily as needed for nausea or vomiting.   PROAIR HFA 108 (90 Base) MCG/ACT inhaler Inhale 2 puffs into the lungs every 4 (four) hours as needed for wheezing or shortness of breath.   Vitamin D, Ergocalciferol, (DRISDOL) 50000 units CAPS capsule Take 50,000 Units by mouth every Friday.    Allergies:   Tramadol   Social History   Socioeconomic History   Marital status: Widowed    Spouse name: Not on file   Number of children: 3   Years of education: Not on file   Highest education level: Not on file  Occupational History   Not on file  Tobacco Use   Smoking status: Never   Smokeless tobacco: Never  Vaping Use   Vaping Use: Never used  Substance  and Sexual Activity   Alcohol use: No    Alcohol/week: 0.0 standard drinks of alcohol   Drug use: No   Sexual activity: Not Currently  Other Topics Concern   Not on file  Social History Narrative   No smoking; no alcohol; in [place]; worked in Char[employer] fundraiser. Lives by self in [place]. Does all of her own housework. Dtr does food shopping for her.   Social Determinants of Health   Financial Resource Strain: Low Risk  (05/27/2022)   Overall Financial Resource Strain (CARDIA)    Difficulty of  Paying Living Expenses: Not hard at all  Food Insecurity: No Food Insecurity (05/27/2022)   Hunger Vital Sign    Worried About Running Out of Food in the Last Year: Never true    Ran Out of Food in the Last Year: Never true  Transportation Needs: No Transportation Needs (05/27/2022)   PRAPARE - Hydrologist (Medical): No    Lack of Transportation (Non-Medical): No  Physical Activity: Insufficiently Active (01/07/2019)   Exercise Vital Sign    Days of Exercise per Week: 2 days    Minutes of Exercise per Session: 10 min  Stress: No Stress Concern Present (05/27/2022)   [place]    Feeling of Stress : Not at all  Social Connections: Moderately Isolated (05/27/2022)   Social Connection and Isolation Panel [NHANES]    Frequency of Communication with Friends and Family: More than three times a week    Frequency of Social Gatherings with Friends and Family: More than three times a week    Attends Religious Services: More than 4 times per year    Active Member of [employer] or Organizations: No    Attends Archivist Meetings: Never    Marital Status: Widowed     Family History:  The patient's family history includes Arthritis in her mother; COPD in her brother and brother; Heart attack in her father; Heart disease in her mother; Parkinson's disease in her sister; Throat cancer in her sister.  ROS:    12-point review of systems is negative unless otherwise noted in HPI.   EKGs/Labs/Other Studies Reviewed:    Studies reviewed were summarized above. The additional studies were reviewed today: As above.  EKG:  EKG is ordered today.  The EKG ordered today demonstrates NSR, 65 bpm, no acute ST-T changes  Recent Labs: 04/05/2022: Magnesium 2.0 05/07/2022: TSH 2.064 05/29/2022: ALT 13; B Natriuretic Peptide 544.1 06/08/2022: BUN 19; Creatinine, Ser 0.71; Hemoglobin 11.2; Platelets 330.0; Potassium 4.3; Sodium 139  Recent Lipid Panel    Component Value Date/Time   CHOL 112 01/26/2022 0623   TRIG 137 01/26/2022 0623   HDL 35 (L) 01/26/2022 0623   CHOLHDL 3.2 01/26/2022 0623   VLDL 27 01/26/2022 0623   LDLCALC 50 01/26/2022 0623   LDLDIRECT 136.0 07/03/2020 1139    PHYSICAL EXAM:    VS:  BP 130/70 (BP Location: Left Arm, Patient Position: Sitting, Cuff Size: Normal)   Pulse 65   Ht 5\' 1"  (1.549 m)   Wt 132 lb (59.9 kg)   SpO2 96%   BMI 24.94 kg/m   BMI: Body mass index is 24.94 kg/m.  Physical Exam Vitals reviewed.  Constitutional:      Appearance: She is well-developed.  HENT:     Head: Normocephalic and atraumatic.  Eyes:     General:        Right eye: No discharge.        Left eye: No discharge.  Neck:     Vascular: No JVD.  Cardiovascular:     Rate and Rhythm: Normal rate and regular rhythm.     Pulses:          Posterior tibial pulses are 2+ on the right side and 2+ on the left side.     Heart sounds: S1 normal and S2 normal. Heart sounds not distant. No midsystolic click and no opening snap. Murmur heard.     Systolic murmur is present with a grade  of 1/6 at the upper right sternal border.     No friction rub.  Pulmonary:     Effort: Pulmonary effort is normal. No respiratory distress.     Breath sounds: Normal breath sounds. No decreased breath sounds, wheezing or rales.  Chest:     Chest wall: No tenderness.  Abdominal:     General: There is no distension.   Musculoskeletal:     Cervical back: Normal range of motion.     Right lower leg: No edema.     Left lower leg: No edema.  Skin:    General: Skin is warm and dry.     Nails: There is no clubbing.  Neurological:     Mental Status: She is alert and oriented to person, place, and time.  Psychiatric:        Speech: Speech normal.        Behavior: Behavior normal.        Thought Content: Thought content normal.        Judgment: Judgment normal.     Wt Readings from Last 3 Encounters:  07/11/22 132 lb (59.9 kg)  07/08/22 130 lb 12.8 oz (59.3 kg)  06/08/22 127 lb 9.6 oz (57.9 kg)     ASSESSMENT & PLAN:   CAD involving native coronaries without angina: She is doing well without symptoms concerning for angina.  Continue aggressive risk factor modification and secondary prevention including aspirin, amlodipine, atorvastatin, and metoprolol.  No indication for further ischemic testing at this time given lack of anginal symptoms, history of hallucinations, and underlying dementia.  PAF: Maintaining sinus rhythm with metoprolol and amiodarone.  CHA2DS2-VASc at least 6 (HTN, age x2, DM, vascular disease, sex category).  Not felt to be an [place] candidate given prior history of hallucinations and now with diagnosed dementia.  Patient and family are in agreement.  Recent TSH and liver function normal.  HTN: Blood pressure is well controlled in the office today.  She remains on amlodipine and metoprolol.  HLD: LDL 50 in 12/2021.  She remains on atorvastatin 80 mg.  Mixed Alzheimer's and vascular dementia: Stable.  Followed by neurology.   Disposition: F/u with Dr. Fletcher Anon or an APP in 4 months.   Medication Adjustments/Labs and Tests Ordered: Current medicines are reviewed at length with the patient today.  Concerns regarding medicines are outlined above. Medication changes, Labs and Tests ordered today are summarized above and listed in the Patient Instructions accessible in Encounters.    Signed, Christell Faith, PA-C 07/11/2022 3:33 PM     [address], [place] [address] [phone]

## 2022-07-11 NOTE — Patient Instructions (Signed)
Medication Instructions:  Your physician recommends that you continue on your current medications as directed. Please refer to the Current Medication list given to you today.  *If you need a refill on your cardiac medications before your next appointment, please call your pharmacy*   Lab Work: None  If you have labs (blood work) drawn today and your tests are completely normal, you will receive your results only by: MyChart Message (if you have MyChart) OR A paper copy in the mail If you have any lab test that is abnormal or we need to change your treatment, we will call you to review the results.   Testing/Procedures: None   Follow-Up: At [facility], you and your health needs are our priority.  As part of our continuing mission to provide you with exceptional heart care, we have created designated Provider Care Teams.  These Care Teams include your primary Cardiologist (physician) and Advanced Practice Providers (APPs -  Physician Assistants and Nurse Practitioners) who all work together to provide you with the care you need, when you need it.  We recommend signing up for the patient portal called "MyChart".  Sign up information is provided on this After Visit Summary.  MyChart is used to connect with patients for Virtual Visits (Telemedicine).  Patients are able to view lab/test results, encounter notes, upcoming appointments, etc.  Non-urgent messages can be sent to your provider as well.   To learn more about what you can do with MyChart, go to [email or web address].    Your next appointment:   4 month(s)  The format for your next appointment:   In Person  Provider:   Lorine Bears, MD or Eula Listen, PA-C       Important Information About Sugar

## 2022-07-12 DIAGNOSIS — I5032 Chronic diastolic (congestive) heart failure: Secondary | ICD-10-CM | POA: Diagnosis not present

## 2022-07-12 DIAGNOSIS — E119 Type 2 diabetes mellitus without complications: Secondary | ICD-10-CM | POA: Diagnosis not present

## 2022-07-12 DIAGNOSIS — J440 Chronic obstructive pulmonary disease with acute lower respiratory infection: Secondary | ICD-10-CM | POA: Diagnosis not present

## 2022-07-12 DIAGNOSIS — I110 Hypertensive heart disease with heart failure: Secondary | ICD-10-CM | POA: Diagnosis not present

## 2022-07-12 DIAGNOSIS — I25118 Atherosclerotic heart disease of native coronary artery with other forms of angina pectoris: Secondary | ICD-10-CM | POA: Diagnosis not present

## 2022-07-13 DIAGNOSIS — I25118 Atherosclerotic heart disease of native coronary artery with other forms of angina pectoris: Secondary | ICD-10-CM | POA: Diagnosis not present

## 2022-07-13 DIAGNOSIS — I480 Paroxysmal atrial fibrillation: Secondary | ICD-10-CM | POA: Diagnosis not present

## 2022-07-13 DIAGNOSIS — I5032 Chronic diastolic (congestive) heart failure: Secondary | ICD-10-CM | POA: Diagnosis not present

## 2022-07-13 DIAGNOSIS — J440 Chronic obstructive pulmonary disease with acute lower respiratory infection: Secondary | ICD-10-CM | POA: Diagnosis not present

## 2022-07-13 DIAGNOSIS — G9341 Metabolic encephalopathy: Secondary | ICD-10-CM | POA: Diagnosis not present

## 2022-07-13 DIAGNOSIS — J188 Other pneumonia, unspecified organism: Secondary | ICD-10-CM | POA: Diagnosis not present

## 2022-07-13 DIAGNOSIS — Z9181 History of falling: Secondary | ICD-10-CM

## 2022-07-13 DIAGNOSIS — I700 Atherosclerosis of aorta: Secondary | ICD-10-CM | POA: Diagnosis not present

## 2022-07-13 DIAGNOSIS — M353 Polymyalgia rheumatica: Secondary | ICD-10-CM | POA: Diagnosis not present

## 2022-07-13 DIAGNOSIS — M810 Age-related osteoporosis without current pathological fracture: Secondary | ICD-10-CM | POA: Diagnosis not present

## 2022-07-13 DIAGNOSIS — E119 Type 2 diabetes mellitus without complications: Secondary | ICD-10-CM | POA: Diagnosis not present

## 2022-07-13 DIAGNOSIS — M130 Polyarthritis, unspecified: Secondary | ICD-10-CM

## 2022-07-13 DIAGNOSIS — Z7984 Long term (current) use of oral hypoglycemic drugs: Secondary | ICD-10-CM

## 2022-07-13 DIAGNOSIS — I110 Hypertensive heart disease with heart failure: Secondary | ICD-10-CM | POA: Diagnosis not present

## 2022-07-13 DIAGNOSIS — E7800 Pure hypercholesterolemia, unspecified: Secondary | ICD-10-CM | POA: Diagnosis not present

## 2022-07-14 ENCOUNTER — Encounter: Payer: Self-pay | Admitting: *Deleted

## 2022-07-18 ENCOUNTER — Other Ambulatory Visit: Payer: Self-pay

## 2022-07-18 DIAGNOSIS — I110 Hypertensive heart disease with heart failure: Secondary | ICD-10-CM | POA: Diagnosis not present

## 2022-07-18 DIAGNOSIS — I25118 Atherosclerotic heart disease of native coronary artery with other forms of angina pectoris: Secondary | ICD-10-CM | POA: Diagnosis not present

## 2022-07-18 DIAGNOSIS — I5032 Chronic diastolic (congestive) heart failure: Secondary | ICD-10-CM | POA: Diagnosis not present

## 2022-07-18 DIAGNOSIS — J440 Chronic obstructive pulmonary disease with acute lower respiratory infection: Secondary | ICD-10-CM | POA: Diagnosis not present

## 2022-07-18 DIAGNOSIS — E119 Type 2 diabetes mellitus without complications: Secondary | ICD-10-CM | POA: Diagnosis not present

## 2022-07-20 ENCOUNTER — Other Ambulatory Visit: Payer: Self-pay

## 2022-07-21 DIAGNOSIS — H2512 Age-related nuclear cataract, left eye: Secondary | ICD-10-CM | POA: Diagnosis not present

## 2022-07-28 DIAGNOSIS — M0579 Rheumatoid arthritis with rheumatoid factor of multiple sites without organ or systems involvement: Secondary | ICD-10-CM | POA: Diagnosis not present

## 2022-07-28 DIAGNOSIS — M353 Polymyalgia rheumatica: Secondary | ICD-10-CM | POA: Diagnosis not present

## 2022-07-28 DIAGNOSIS — M791 Myalgia, unspecified site: Secondary | ICD-10-CM | POA: Diagnosis not present

## 2022-07-28 DIAGNOSIS — Z7960 Long term (current) use of unspecified immunomodulators and immunosuppressants: Secondary | ICD-10-CM | POA: Diagnosis not present

## 2022-07-29 DIAGNOSIS — I110 Hypertensive heart disease with heart failure: Secondary | ICD-10-CM | POA: Diagnosis not present

## 2022-07-29 DIAGNOSIS — I25118 Atherosclerotic heart disease of native coronary artery with other forms of angina pectoris: Secondary | ICD-10-CM | POA: Diagnosis not present

## 2022-07-29 DIAGNOSIS — J440 Chronic obstructive pulmonary disease with acute lower respiratory infection: Secondary | ICD-10-CM | POA: Diagnosis not present

## 2022-07-29 DIAGNOSIS — I5032 Chronic diastolic (congestive) heart failure: Secondary | ICD-10-CM | POA: Diagnosis not present

## 2022-07-29 DIAGNOSIS — E119 Type 2 diabetes mellitus without complications: Secondary | ICD-10-CM | POA: Diagnosis not present

## 2022-08-02 DIAGNOSIS — I110 Hypertensive heart disease with heart failure: Secondary | ICD-10-CM | POA: Diagnosis not present

## 2022-08-02 DIAGNOSIS — J440 Chronic obstructive pulmonary disease with acute lower respiratory infection: Secondary | ICD-10-CM | POA: Diagnosis not present

## 2022-08-02 DIAGNOSIS — H353213 Exudative age-related macular degeneration, right eye, with inactive scar: Secondary | ICD-10-CM | POA: Diagnosis not present

## 2022-08-02 DIAGNOSIS — H353124 Nonexudative age-related macular degeneration, left eye, advanced atrophic with subfoveal involvement: Secondary | ICD-10-CM | POA: Diagnosis not present

## 2022-08-02 DIAGNOSIS — I25118 Atherosclerotic heart disease of native coronary artery with other forms of angina pectoris: Secondary | ICD-10-CM | POA: Diagnosis not present

## 2022-08-02 DIAGNOSIS — I5032 Chronic diastolic (congestive) heart failure: Secondary | ICD-10-CM | POA: Diagnosis not present

## 2022-08-02 DIAGNOSIS — H43812 Vitreous degeneration, left eye: Secondary | ICD-10-CM | POA: Diagnosis not present

## 2022-08-02 DIAGNOSIS — E119 Type 2 diabetes mellitus without complications: Secondary | ICD-10-CM | POA: Diagnosis not present

## 2022-08-12 ENCOUNTER — Ambulatory Visit (INDEPENDENT_AMBULATORY_CARE_PROVIDER_SITE_OTHER): Payer: Medicare Other

## 2022-08-12 ENCOUNTER — Ambulatory Visit: Payer: Medicare Other

## 2022-08-12 DIAGNOSIS — E538 Deficiency of other specified B group vitamins: Secondary | ICD-10-CM

## 2022-08-12 MED ORDER — CYANOCOBALAMIN 1000 MCG/ML IJ SOLN
1000.0000 ug | Freq: Once | INTRAMUSCULAR | Status: AC
  Administered 2022-08-12: 1000 ug via INTRAMUSCULAR

## 2022-08-12 NOTE — Progress Notes (Signed)
Patient arrived for B12 injection. Given B12 in left arm. Patient tolerated B12 injection well. Patient did not show any signs of distress or voice any concerns.

## 2022-08-16 ENCOUNTER — Ambulatory Visit: Payer: Medicare Other

## 2022-08-26 DIAGNOSIS — G4700 Insomnia, unspecified: Secondary | ICD-10-CM | POA: Diagnosis not present

## 2022-08-26 DIAGNOSIS — G309 Alzheimer's disease, unspecified: Secondary | ICD-10-CM | POA: Diagnosis not present

## 2022-08-26 DIAGNOSIS — G939 Disorder of brain, unspecified: Secondary | ICD-10-CM | POA: Diagnosis not present

## 2022-09-01 ENCOUNTER — Ambulatory Visit: Payer: Medicare Other | Admitting: Cardiovascular Disease

## 2022-09-02 ENCOUNTER — Ambulatory Visit (INDEPENDENT_AMBULATORY_CARE_PROVIDER_SITE_OTHER): Payer: Medicare Other

## 2022-09-02 ENCOUNTER — Ambulatory Visit (INDEPENDENT_AMBULATORY_CARE_PROVIDER_SITE_OTHER): Payer: Medicare Other | Admitting: Internal Medicine

## 2022-09-02 ENCOUNTER — Encounter: Payer: Self-pay | Admitting: Internal Medicine

## 2022-09-02 VITALS — BP 128/78 | HR 73 | Temp 97.5°F | Resp 17 | Ht 61.0 in | Wt 137.6 lb

## 2022-09-02 DIAGNOSIS — J449 Chronic obstructive pulmonary disease, unspecified: Secondary | ICD-10-CM

## 2022-09-02 DIAGNOSIS — I25118 Atherosclerotic heart disease of native coronary artery with other forms of angina pectoris: Secondary | ICD-10-CM

## 2022-09-02 DIAGNOSIS — I5032 Chronic diastolic (congestive) heart failure: Secondary | ICD-10-CM | POA: Diagnosis not present

## 2022-09-02 DIAGNOSIS — I2510 Atherosclerotic heart disease of native coronary artery without angina pectoris: Secondary | ICD-10-CM | POA: Diagnosis not present

## 2022-09-02 DIAGNOSIS — M353 Polymyalgia rheumatica: Secondary | ICD-10-CM | POA: Diagnosis not present

## 2022-09-02 DIAGNOSIS — I10 Essential (primary) hypertension: Secondary | ICD-10-CM | POA: Diagnosis not present

## 2022-09-02 DIAGNOSIS — I4891 Unspecified atrial fibrillation: Secondary | ICD-10-CM | POA: Diagnosis not present

## 2022-09-02 DIAGNOSIS — R9389 Abnormal findings on diagnostic imaging of other specified body structures: Secondary | ICD-10-CM

## 2022-09-02 DIAGNOSIS — J9811 Atelectasis: Secondary | ICD-10-CM | POA: Diagnosis not present

## 2022-09-02 DIAGNOSIS — R0981 Nasal congestion: Secondary | ICD-10-CM

## 2022-09-02 DIAGNOSIS — E7800 Pure hypercholesterolemia, unspecified: Secondary | ICD-10-CM

## 2022-09-02 DIAGNOSIS — M810 Age-related osteoporosis without current pathological fracture: Secondary | ICD-10-CM | POA: Diagnosis not present

## 2022-09-02 DIAGNOSIS — E1159 Type 2 diabetes mellitus with other circulatory complications: Secondary | ICD-10-CM | POA: Diagnosis not present

## 2022-09-02 DIAGNOSIS — D509 Iron deficiency anemia, unspecified: Secondary | ICD-10-CM | POA: Diagnosis not present

## 2022-09-02 NOTE — Progress Notes (Unsigned)
[name], female   DOB: January 09, 1933, 88 y.o.   MRN: [id number]   Subjective:    [name], female    DOB: [birth year], 88 y.o.   MRN: [id number]   Patient here for  Chief Complaint  Patient presents with   Follow-up    Feels pressure/fluid in ears x 2-3wks   .   HPI Here to follow up regarding her blood sugar, blood pressure, cholesterol and COPD.  She is accompanied by her son and daughter-n-law.  History obtained from all of them.  Reports she is doing better.  Breathing overall stable.  Able to get around better.  No chest pain.  No increased cough or congestion.  Does report bilateral ears - stopped up.  No earache.  No increased chest congestion.  No abdominal pain.  Bowels moving.  Saw neurology 10/27 - continue aricept and aspirin daily.  Taking remeron.  Some fatigue.  Off prednisone.  Continues MTX.     Past Medical History:  Diagnosis Date   (HFpEF) heart failure with preserved ejection fraction ([place])    a. TTE 12/19: EF 55-60%, probable HK of the mid apical anterior septal myocardium, Gr1DD, mild AI, mildly dilated LA; b.07/2020 Echo: EF 60-65%, no rwma, Gr2 DD. Nl RV fxn. Mildly dil LA. Mild MR; c. 10/2021 Echo: EF 60-65%, no rwma, mild LVH, nl RV fxn, mod elev PASP, mildly dil LA, mild MR, Ao sclerosis.   Asthma    CAD (coronary artery disease)    a. 09/2018 NSTEMI/PCI: LM min irregs, mLAD 95 (PCI/DES), mLAD-2 60%, LCx mild diff dzs, RCA min irregs; b. 03/2020 MV: EF>65%, no ischemia/scar; c. 07/2020 Cath: LM min irregs, LAD 30p, 56m 90d, D1/2 min irregs, LCX diff dzs throughout, OM1/2/3 mild dzs, RCA 30p, RPDA/RPAV min irrges. EF 55-65%.   CHF (congestive heart failure) ([place]    Diabetes mellitus ([facility]    Hypercholesterolemia    Hypertension    Myocardial infarction ([facility]    Osteopenia    Palpitations    a. 04/2020 [facility]: Avg HR 75. 429 SVT episodes, longest 19 secs @ 133. Occas PACs (3.2%). Rare PVCs (<1%).   Polymyalgia rheumatica  syndrome (HCC)    Reactive airway disease    Severe sepsis ([facility]    Past Surgical History:  Procedure Laterality Date   ABDOMINAL HYSTERECTOMY  11/01/1979   prolapse and bleeding, ovaries not removed   BREAST EXCISIONAL BIOPSY Right    CATARACT EXTRACTION Right    CHOLECYSTECTOMY N/A 09/02/2019   Procedure: LAPAROSCOPIC CHOLECYSTECTOMY WITH INTRAOPERATIVE CHOLANGIOGRAM;  Surgeon: POlean Ree MD;  Location: [facility] ORS;  Service: General;  Laterality: N/A;   CORONARY STENT INTERVENTION N/A 10/08/2018   Procedure: CORONARY STENT INTERVENTION;  Surgeon: AWellington Hampshire MD;  [facility];  Service: Cardiovascular;  Laterality: N/A;   ENDOSCOPIC RETROGRADE CHOLANGIOPANCREATOGRAPHY (ERCP) WITH PROPOFOL N/A 08/08/2019   Procedure: ENDOSCOPIC RETROGRADE CHOLANGIOPANCREATOGRAPHY (ERCP) WITH PROPOFOL;  Surgeon: WLucilla Lame MD;  Location: [facility] ENDOSCOPY;  Service: Endoscopy;  Laterality: N/A;   LEFT HEART CATH AND CORONARY ANGIOGRAPHY N/A 10/08/2018   Procedure: LEFT HEART CATH AND CORONARY ANGIOGRAPHY;  Surgeon: AWellington Hampshire MD;  [place] [facility];  Service: Cardiovascular;  Laterality: N/A;   LEFT HEART CATH AND CORONARY ANGIOGRAPHY N/A 08/10/2020   Procedure: LEFT HEART CATH AND CORONARY ANGIOGRAPHY possible percutaneous intervention;  Surgeon: AWellington Hampshire MD;  [facility];  Service: Cardiovascular;  Laterality: N/A;  UMBILICAL HERNIA REPAIR  04/30/1993   Family History  Problem Relation Age of Onset   Arthritis Mother    Heart disease Mother    Heart attack Father    Throat cancer Sister    Parkinson's disease Sister    COPD Brother    COPD Brother    Social History   Socioeconomic History   Marital status: Widowed    Spouse name: Not on file   Number of children: 3   Years of education: Not on file   Highest education level: Not on file  Occupational History   Not on file  Tobacco Use   Smoking status: Never    Smokeless tobacco: Never  Vaping Use   Vaping Use: Never used  Substance and Sexual Activity   Alcohol use: No    Alcohol/week: 0.0 standard drinks of alcohol   Drug use: No   Sexual activity: Not Currently  Other Topics Concern   Not on file  Social History Narrative   No smoking; no alcohol; in [place]; worked in [employer] fund[employer]. Lives by self in [place]. Does all of her own housework. Dtr does food shopping for her.   Social Determinants of Health   Financial Resource Strain: Low Risk  (05/27/2022)   Overall Financial Resource Strain (CARDIA)    Difficulty of Paying Living Expenses: Not hard at all  Food Insecurity: No Food Insecurity (05/27/2022)   Hunger Vital Sign    Worried About Running Out of Food in the Last Year: Never true    Ran Out of Food in the Last Year: Never true  Transportation Needs: No Transportation Needs (05/27/2022)   PRAPARE - Hydrologist (Medical): No    Lack of Transportation (Non-Medical): No  Physical Activity: Insufficiently Active (01/07/2019)   Exercise Vital Sign    Days of Exercise per Week: 2 days    Minutes of Exercise per Session: 10 min  Stress: No Stress Concern Present (05/27/2022)   [place]    Feeling of Stress : Not at all  Social Connections: Moderately Isolated (05/27/2022)   Social Connection and Isolation Panel [NHANES]    Frequency of Communication with Friends and Family: More than three times a week    Frequency of Social Gatherings with Friends and Family: More than three times a week    Attends Religious Services: More than 4 times per year    Active Member of [employer] or Organizations: No    Attends Archivist Meetings: Never    Marital Status: Widowed     Review of Systems  Constitutional:  Negative for appetite change and unexpected weight change.  HENT:  Negative for sinus pressure.        No increased congestion. Ears  - stopped up.   Respiratory:  Negative for cough and chest tightness.        Breathing stable.   Cardiovascular:  Negative for chest pain and palpitations.  Gastrointestinal:  Negative for abdominal pain, diarrhea, nausea and vomiting.  Genitourinary:  Negative for difficulty urinating and dysuria.  Musculoskeletal:  Negative for joint swelling and myalgias.  Skin:  Negative for color change and rash.  Neurological:  Negative for dizziness and headaches.  Psychiatric/Behavioral:  Negative for agitation and dysphoric mood.        Objective:     BP 128/78 (BP Location: Left Arm, Patient Position: Sitting, Cuff Size: Small)   Pulse 73   Temp Marland Kitchen)  97.5 F (36.4 C) (Temporal)   Resp 17   Ht _0  (1.549 m)   Wt 137 lb 9.6 oz (62.4 kg)   SpO2 98%   BMI 26.00 kg/m  Wt Readings from Last 3 Encounters:  09/02/22 137 lb 9.6 oz (62.4 kg)  07/11/22 132 lb (59.9 kg)  07/08/22 130 lb 12.8 oz (59.3 kg)    Physical Exam Vitals reviewed.  Constitutional:      General: She is not in acute distress.    Appearance: Normal appearance.  HENT:     Head: Normocephalic and atraumatic.     Right Ear: Ear canal and external ear normal.     Left Ear: Tympanic membrane, ear canal and external ear normal.  Eyes:     General: No scleral icterus.       Right eye: No discharge.        Left eye: No discharge.     Conjunctiva/sclera: Conjunctivae normal.  Neck:     Thyroid: No thyromegaly.  Cardiovascular:     Rate and Rhythm: Normal rate and regular rhythm.  Pulmonary:     Effort: No respiratory distress.     Breath sounds: Normal breath sounds. No wheezing.  Abdominal:     General: Bowel sounds are normal.     Palpations: Abdomen is soft.     Tenderness: There is no abdominal tenderness.  Musculoskeletal:        General: No swelling or tenderness.     Cervical back: Neck supple. No tenderness.  Lymphadenopathy:     Cervical: No cervical adenopathy.  Skin:    Findings: No erythema or  rash.  Neurological:     Mental Status: She is alert.  Psychiatric:        Mood and Affect: Mood normal.        Behavior: Behavior normal.      Outpatient Encounter Medications as of 09/02/2022  Medication Sig   acetaminophen (TYLENOL) 500 MG tablet Take 500-1,000 mg by mouth every 6 (six) hours as needed for mild pain or moderate pain.   Albuterol Sulfate (PROAIR RESPICLICK) 782 (90 Base) MCG/ACT AEPB Inhale into the lungs every 4 (four) hours. PRN   amLODipine (NORVASC) 2.5 MG tablet Take 1 tablet (2.5 mg total) by mouth daily.   aspirin 81 MG tablet Take 81 mg by mouth daily.   atorvastatin (LIPITOR) 80 MG tablet TAKE 1 TABLET BY MOUTH ONCE DAILY AT  6PM   donepezil (ARICEPT) 5 MG tablet Take by mouth.   folic acid (FOLVITE) 1 MG tablet Take 1 mg by mouth daily.   furosemide (LASIX) 40 MG tablet Take 1 tablet (40 mg total) by mouth daily as needed (weight gain 5 lbs over 1-2 days, lower extremity swelling, shortness of breath. If medication does not resolve symptoms in 2 days please call Dr / 911).   ipratropium-albuterol (DUONEB) 0.5-2.5 (3) MG/3ML SOLN Take 3 mLs by nebulization 2 (two) times daily as needed (shortness of breath or wheezing).   metFORMIN (GLUCOPHAGE) 500 MG tablet TAKE 1 TABLET BY MOUTH TWICE DAILY WITH A MEAL   methotrexate (RHEUMATREX) 2.5 MG tablet Take 12.5 mg by mouth every Saturday.   metoprolol tartrate (LOPRESSOR) 25 MG tablet Take 1 tablet (25 mg total) by mouth 2 (two) times daily.   mirtazapine (REMERON) 7.5 MG tablet 1/2 tablet q hs prn   Multiple Vitamins-Minerals (PRESERVISION AREDS 2) CAPS Take 1 capsule by mouth 2 (two) times daily.   nitroGLYCERIN (NITROSTAT) 0.4 MG SL  tablet Place 1 tablet (0.4 mg total) under the tongue every 5 (five) minutes as needed for chest pain.   ondansetron (ZOFRAN-ODT) 4 MG disintegrating tablet Take 4 mg by mouth 3 (three) times daily as needed for nausea or vomiting.   PROAIR HFA 108 (90 Base) MCG/ACT inhaler Inhale 2  puffs into the lungs every 4 (four) hours as needed for wheezing or shortness of breath.   Vitamin D, Ergocalciferol, (DRISDOL) 50000 units CAPS capsule Take 50,000 Units by mouth every Friday.   No facility-administered encounter medications on file as of 09/02/2022.     Lab Results  Component Value Date   WBC 11.3 (H) 09/02/2022   HGB 12.4 09/02/2022   HCT 36.4 09/02/2022   PLT 291 09/02/2022   GLUCOSE 123 (H) 09/02/2022   CHOL 159 09/02/2022   TRIG 349 (H) 09/02/2022   HDL 52 09/02/2022   LDLDIRECT 136.0 07/03/2020   LDLCALC 65 09/02/2022   ALT 19 09/02/2022   AST 22 09/02/2022   NA 140 09/02/2022   K 4.3 09/02/2022   CL 103 09/02/2022   CREATININE 0.79 09/02/2022   BUN 15 09/02/2022   CO2 20 09/02/2022   TSH 2.064 05/07/2022   INR 1.1 05/29/2022   HGBA1C 6.7 (H) 09/02/2022   MICROALBUR 1.1 07/23/2021    US Venous Img Lower Unilateral Left  Result Date: 06/08/2022 CLINICAL DATA:  Left calf tenderness. EXAM: LEFT LOWER EXTREMITY VENOUS DOPPLER ULTRASOUND TECHNIQUE: Gray-scale sonography with compression, as well as color and duplex ultrasound, were performed to evaluate the deep venous system(s) from the level of the common femoral vein through the popliteal and proximal calf veins. COMPARISON:  None Available. FINDINGS: VENOUS Normal compressibility of the common femoral, superficial femoral, and popliteal veins, as well as the visualized calf veins. Visualized portions of profunda femoral vein and great saphenous vein unremarkable. No filling defects to suggest DVT on grayscale or color Doppler imaging. Doppler waveforms show normal direction of venous flow, normal respiratory plasticity and response to augmentation. Limited views of the contralateral common femoral vein are unremarkable. OTHER None. Limitations: None. IMPRESSION: Negative. Electronically Signed   By: Titus Dubin M.D.   On: 06/08/2022 13:28       Assessment & Plan:   Problem List Items Addressed This  Visit     Abnormal CXR    Previous infiltrate.  Repeat cxr today.       Relevant Orders   DG Chest 2 View   Anemia    Has seen hematology.  Follow cbc.       Atrial fibrillation ([place])    Noted during June hospitalization.  On amiodarone.  Saw Dr Fletcher Anon 05/19/22 - decreased metoprolol to 38m bid.  Appears to be in SR. Follow.       CAD (coronary artery disease)    S/p stent placement.  Continue risk factor modification. Continue aspirin, metoprolol and atorvastatin.       Chronic diastolic CHF (congestive heart failure) (HCC)    No evidence of volume overload on exam.  Continues metoprolol.  Follow.       COPD (chronic obstructive pulmonary disease) (HCC)    Continue duonebs, breo and rescue inhaler as needed.       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)    Continue aspirin, beta blocker and statin.  Currently stable.  Follow       Diabetes mellitus ([place]    Recheck met b and A1c today.  Relevant Orders   BASIC METABOLIC PANEL WITH GFR (Completed)   Hypercholesterolemia    Continue atorvastatin.  Follow lipid panel and liver function tests.        Relevant Orders   CBC with Differential/Platelet (Completed)   Hepatic function panel (Completed)   Lipid panel (Completed)   Hypertension    Blood pressure as outlined.  Recheck improved. Continue metoprolol and amlodipine.  Follow pressures.  Hold on making any changes today. Follow metabolic panel.       Nasal congestion    Saline nasal spray and steroid nasal spray as directed.  Follow.       Osteoporosis     Completed alendronate 5 years - 02/2022.       Polymyalgia rheumatica syndrome ([place])    Followed by Dr Posey Pronto. Continue f/u with rheumatology. Continue MTX.  Off prednisone.       Type 2 diabetes mellitus with cardiac complication (HCC) - Primary   Relevant Orders   HgB A1c (Completed)     Einar Pheasant, MD

## 2022-09-02 NOTE — Patient Instructions (Signed)
Saline nasal spray - flush nose at least 2x/day  Nasacort nasal spray - 2 sprays each nostril one time per day.  Do this in the evening.  

## 2022-09-03 ENCOUNTER — Encounter: Payer: Self-pay | Admitting: Internal Medicine

## 2022-09-03 DIAGNOSIS — R0981 Nasal congestion: Secondary | ICD-10-CM | POA: Insufficient documentation

## 2022-09-03 LAB — HEPATIC FUNCTION PANEL
AG Ratio: 1.6 (calc) (ref 1.0–2.5)
ALT: 19 U/L (ref 6–29)
AST: 22 U/L (ref 10–35)
Albumin: 4.4 g/dL (ref 3.6–5.1)
Alkaline phosphatase (APISO): 65 U/L (ref 37–153)
Bilirubin, Direct: 0.1 mg/dL (ref 0.0–0.2)
Globulin: 2.8 g/dL (calc) (ref 1.9–3.7)
Indirect Bilirubin: 0.4 mg/dL (calc) (ref 0.2–1.2)
Total Bilirubin: 0.5 mg/dL (ref 0.2–1.2)
Total Protein: 7.2 g/dL (ref 6.1–8.1)

## 2022-09-03 LAB — CBC WITH DIFFERENTIAL/PLATELET
Absolute Monocytes: 746 cells/uL (ref 200–950)
Basophils Absolute: 45 cells/uL (ref 0–200)
Basophils Relative: 0.4 %
Eosinophils Absolute: 463 cells/uL (ref 15–500)
Eosinophils Relative: 4.1 %
HCT: 36.4 % (ref 35.0–45.0)
Hemoglobin: 12.4 g/dL (ref 11.7–15.5)
Lymphs Abs: 2305 cells/uL (ref 850–3900)
MCH: 31.2 pg (ref 27.0–33.0)
MCHC: 34.1 g/dL (ref 32.0–36.0)
MCV: 91.5 fL (ref 80.0–100.0)
MPV: 11.9 fL (ref 7.5–12.5)
Monocytes Relative: 6.6 %
Neutro Abs: 7741 cells/uL (ref 1500–7800)
Neutrophils Relative %: 68.5 %
Platelets: 291 10*3/uL (ref 140–400)
RBC: 3.98 10*6/uL (ref 3.80–5.10)
RDW: 14.9 % (ref 11.0–15.0)
Total Lymphocyte: 20.4 %
WBC: 11.3 10*3/uL — ABNORMAL HIGH (ref 3.8–10.8)

## 2022-09-03 LAB — BASIC METABOLIC PANEL WITH GFR
BUN: 15 mg/dL (ref 7–25)
CO2: 20 mmol/L (ref 20–32)
Calcium: 9.6 mg/dL (ref 8.6–10.4)
Chloride: 103 mmol/L (ref 98–110)
Creat: 0.79 mg/dL (ref 0.60–0.95)
Glucose, Bld: 123 mg/dL — ABNORMAL HIGH (ref 65–99)
Potassium: 4.3 mmol/L (ref 3.5–5.3)
Sodium: 140 mmol/L (ref 135–146)
eGFR: 72 mL/min/{1.73_m2} (ref >=60)

## 2022-09-03 LAB — LIPID PANEL
Cholesterol: 159 mg/dL (ref <200)
HDL: 52 mg/dL (ref >=50)
LDL Cholesterol (Calc): 65 mg/dL (calc)
Non-HDL Cholesterol (Calc): 107 mg/dL (calc) (ref <130)
Total CHOL/HDL Ratio: 3.1 (calc) (ref <5.0)
Triglycerides: 349 mg/dL — ABNORMAL HIGH (ref <150)

## 2022-09-03 LAB — HEMOGLOBIN A1C
Hgb A1c MFr Bld: 6.7 % of total Hgb — ABNORMAL HIGH (ref <5.7)
Mean Plasma Glucose: 146 mg/dL
eAG (mmol/L): 8.1 mmol/L

## 2022-09-03 NOTE — Assessment & Plan Note (Signed)
Continue aspirin, beta blocker and statin.  Currently stable.  Follow

## 2022-09-03 NOTE — Assessment & Plan Note (Signed)
Has seen hematology.  Follow cbc.  

## 2022-09-03 NOTE — Assessment & Plan Note (Signed)
Saline nasal spray and steroid nasal spray as directed.  Follow.   

## 2022-09-03 NOTE — Assessment & Plan Note (Signed)
Followed by Dr Posey Pronto. Continue f/u with rheumatology. Continue MTX.  Off prednisone.

## 2022-09-03 NOTE — Assessment & Plan Note (Signed)
Continue duonebs, breo and rescue inhaler as needed.

## 2022-09-03 NOTE — Assessment & Plan Note (Signed)
Blood pressure as outlined.  Recheck improved. Continue metoprolol and amlodipine.  Follow pressures.  Hold on making any changes today. Follow metabolic panel.

## 2022-09-03 NOTE — Addendum Note (Signed)
Addended by: Alisa Graff on: 09/03/2022 01:18 PM   Modules accepted: Level of Service

## 2022-09-03 NOTE — Assessment & Plan Note (Signed)
Noted during June hospitalization.  On amiodarone.  Saw Dr Fletcher Anon 05/19/22 - decreased metoprolol to 25mg  bid.  Appears to be in SR. Follow.

## 2022-09-03 NOTE — Assessment & Plan Note (Signed)
Recheck met b and A1c today.

## 2022-09-03 NOTE — Assessment & Plan Note (Signed)
Continue atorvastatin.  Follow lipid panel and liver function tests.

## 2022-09-03 NOTE — Assessment & Plan Note (Signed)
S/p stent placement.  Continue risk factor modification. Continue aspirin, metoprolol and atorvastatin.

## 2022-09-03 NOTE — Assessment & Plan Note (Signed)
Completed alendronate 5 years - 02/2022.

## 2022-09-03 NOTE — Assessment & Plan Note (Signed)
Previous infiltrate.  Repeat cxr today.

## 2022-09-03 NOTE — Assessment & Plan Note (Signed)
No evidence of volume overload on exam.  Continues metoprolol.  Follow.

## 2022-09-07 ENCOUNTER — Telehealth: Payer: Self-pay

## 2022-09-07 ENCOUNTER — Other Ambulatory Visit: Payer: Self-pay

## 2022-09-07 DIAGNOSIS — R9389 Abnormal findings on diagnostic imaging of other specified body structures: Secondary | ICD-10-CM

## 2022-09-07 NOTE — Telephone Encounter (Signed)
Monica returning call

## 2022-09-07 NOTE — Telephone Encounter (Signed)
I spoke with daughter Rinaldo Cloud & gave her chest xray results. She asked that I call her SIL, Monica to schedule repeat chest xray. I have LM for her to call back to get patient scheduled for repeat chest xray in 4-6 weeks.

## 2022-09-08 NOTE — Telephone Encounter (Signed)
Pt scheduled for repeat chest xray 12/11.

## 2022-09-16 ENCOUNTER — Ambulatory Visit (INDEPENDENT_AMBULATORY_CARE_PROVIDER_SITE_OTHER): Payer: Medicare Other

## 2022-09-16 DIAGNOSIS — E538 Deficiency of other specified B group vitamins: Secondary | ICD-10-CM | POA: Diagnosis not present

## 2022-09-16 MED ORDER — CYANOCOBALAMIN 1000 MCG/ML IJ SOLN
1000.0000 ug | Freq: Once | INTRAMUSCULAR | Status: AC
  Administered 2022-09-16: 1000 ug via INTRAMUSCULAR

## 2022-09-16 NOTE — Progress Notes (Addendum)
Pt arrived for B12 injection, given in L deltoid. Pt tolerated injection well, showed no signs of distress nor voiced any concerns.  ?

## 2022-10-04 ENCOUNTER — Ambulatory Visit: Payer: 59 | Admitting: Cardiovascular Disease

## 2022-10-09 ENCOUNTER — Other Ambulatory Visit: Payer: Self-pay | Admitting: Internal Medicine

## 2022-10-10 ENCOUNTER — Ambulatory Visit (INDEPENDENT_AMBULATORY_CARE_PROVIDER_SITE_OTHER): Payer: Medicare Other

## 2022-10-10 ENCOUNTER — Other Ambulatory Visit: Payer: Medicare Other

## 2022-10-10 DIAGNOSIS — J984 Other disorders of lung: Secondary | ICD-10-CM | POA: Diagnosis not present

## 2022-10-10 DIAGNOSIS — R918 Other nonspecific abnormal finding of lung field: Secondary | ICD-10-CM | POA: Diagnosis not present

## 2022-10-10 DIAGNOSIS — R9389 Abnormal findings on diagnostic imaging of other specified body structures: Secondary | ICD-10-CM

## 2022-10-12 DIAGNOSIS — J4520 Mild intermittent asthma, uncomplicated: Secondary | ICD-10-CM | POA: Diagnosis not present

## 2022-10-18 ENCOUNTER — Ambulatory Visit (INDEPENDENT_AMBULATORY_CARE_PROVIDER_SITE_OTHER): Payer: Medicare Other | Admitting: Internal Medicine

## 2022-10-18 ENCOUNTER — Encounter: Payer: Self-pay | Admitting: Internal Medicine

## 2022-10-18 VITALS — BP 130/76 | HR 78 | Temp 98.2°F | Resp 15 | Ht 61.0 in | Wt 139.2 lb

## 2022-10-18 DIAGNOSIS — I10 Essential (primary) hypertension: Secondary | ICD-10-CM

## 2022-10-18 DIAGNOSIS — N649 Disorder of breast, unspecified: Secondary | ICD-10-CM

## 2022-10-18 DIAGNOSIS — I5032 Chronic diastolic (congestive) heart failure: Secondary | ICD-10-CM | POA: Diagnosis not present

## 2022-10-18 DIAGNOSIS — E1159 Type 2 diabetes mellitus with other circulatory complications: Secondary | ICD-10-CM

## 2022-10-18 DIAGNOSIS — I4891 Unspecified atrial fibrillation: Secondary | ICD-10-CM | POA: Diagnosis not present

## 2022-10-18 DIAGNOSIS — J449 Chronic obstructive pulmonary disease, unspecified: Secondary | ICD-10-CM | POA: Diagnosis not present

## 2022-10-18 DIAGNOSIS — E538 Deficiency of other specified B group vitamins: Secondary | ICD-10-CM

## 2022-10-18 MED ORDER — DOXYCYCLINE HYCLATE 100 MG PO TABS: 100.0000 mg | ORAL_TABLET | Freq: Two times a day (BID) | ORAL | 0 refills | Status: DC

## 2022-10-18 NOTE — Progress Notes (Unsigned)
Subjective:    [name], female    DOB: [birth year], 88 y.o.   MRN: [id number]  Patient here for No chief complaint on file.   HPI Work in appt - work in for breast lesion.  Accompanied by her daughter-n-law.  History obtained from both of them.  Reports noticing the "lesion" on her right breast two weeks ago.  Was bigger and redness was worse.  Has been applying hot compresses and using abx ointment.  Pain with palpation.  No other pain.  No fever.  Eating and drinking ok. No nausea or vomiting.  Just saw Dr Raul Del 10/12/22 - f/u asthma/copd.  Stable.  Recommended f/u in 7 months. She feels breathing is stable. Continues aspirin and aricept.  On amiodarone.  Sees Dr Fletcher Anon.    Past Medical History:  Diagnosis Date   (HFpEF) heart failure with preserved ejection fraction ([place])    a. TTE 12/19: EF 55-60%, probable HK of the mid apical anterior septal myocardium, Gr1DD, mild AI, mildly dilated LA; b.07/2020 Echo: EF 60-65%, no rwma, Gr2 DD. Nl RV fxn. Mildly dil LA. Mild MR; c. 10/2021 Echo: EF 60-65%, no rwma, mild LVH, nl RV fxn, mod elev PASP, mildly dil LA, mild MR, Ao sclerosis.   Asthma    CAD (coronary artery disease)    a. 09/2018 NSTEMI/PCI: LM min irregs, mLAD 95 (PCI/DES), mLAD-2 60%, LCx mild diff dzs, RCA min irregs; b. 03/2020 MV: EF>65%, no ischemia/scar; c. 07/2020 Cath: LM min irregs, LAD 30p, 65m 90d, D1/2 min irregs, LCX diff dzs throughout, OM1/2/3 mild dzs, RCA 30p, RPDA/RPAV min irrges. EF 55-65%.   CHF (congestive heart failure) ([place]    Diabetes mellitus ([facility]    Hypercholesterolemia    Hypertension    Myocardial infarction ([facility]    Osteopenia    Palpitations    a. 04/2020 Zio: Avg HR 75. 429 SVT episodes, longest 19 secs @ 133. Occas PACs (3.2%). Rare PVCs (<1%).   Polymyalgia rheumatica syndrome (HCC)    Reactive airway disease    Severe sepsis ([facility]    Past Surgical History:  Procedure Laterality Date   ABDOMINAL HYSTERECTOMY  11/01/1979    prolapse and bleeding, ovaries not removed   BREAST EXCISIONAL BIOPSY Right    CATARACT EXTRACTION Right    CHOLECYSTECTOMY N/A 09/02/2019   Procedure: LAPAROSCOPIC CHOLECYSTECTOMY WITH INTRAOPERATIVE CHOLANGIOGRAM;  Surgeon: POlean Ree MD;  Location: [facility] ORS;  Service: General;  Laterality: N/A;   CORONARY STENT INTERVENTION N/A 10/08/2018   Procedure: CORONARY STENT INTERVENTION;  Surgeon: AWellington Hampshire MD;  [facility];  Service: Cardiovascular;  Laterality: N/A;   ENDOSCOPIC RETROGRADE CHOLANGIOPANCREATOGRAPHY (ERCP) WITH PROPOFOL N/A 08/08/2019   Procedure: ENDOSCOPIC RETROGRADE CHOLANGIOPANCREATOGRAPHY (ERCP) WITH PROPOFOL;  Surgeon: WLucilla Lame MD;  Location: [facility] ENDOSCOPY;  Service: Endoscopy;  Laterality: N/A;   LEFT HEART CATH AND CORONARY ANGIOGRAPHY N/A 10/08/2018   Procedure: LEFT HEART CATH AND CORONARY ANGIOGRAPHY;  Surgeon: AWellington Hampshire MD;  [facility];  Service: Cardiovascular;  Laterality: N/A;   LEFT HEART CATH AND CORONARY ANGIOGRAPHY N/A 08/10/2020   Procedure: LEFT HEART CATH AND CORONARY ANGIOGRAPHY possible percutaneous intervention;  Surgeon: AWellington Hampshire MD;  [facility];  Service: Cardiovascular;  Laterality: N/A;   UMBILICAL HERNIA REPAIR  04/30/1993   Family History  Problem Relation Age of Onset   Arthritis Mother    Heart disease Mother    Heart attack Father  Throat cancer Sister    Parkinson's disease Sister    COPD Brother    COPD Brother    Social History   Socioeconomic History   Marital status: Widowed    Spouse name: Not on file   Number of children: 3   Years of education: Not on file   Highest education level: Not on file  Occupational History   Not on file  Tobacco Use   Smoking status: Never   Smokeless tobacco: Never  Vaping Use   Vaping Use: Never used  Substance and Sexual Activity   Alcohol use: No    Alcohol/week: 0.0 standard drinks of alcohol    Drug use: No   Sexual activity: Not Currently  Other Topics Concern   Not on file  Social History Narrative   No smoking; no alcohol; in [place]; worked in [employer] fund[employer]. Lives by self in [place]. Does all of her own housework. Dtr does food shopping for her.   Social Determinants of Health   Financial Resource Strain: Low Risk  (05/27/2022)   Overall Financial Resource Strain (CARDIA)    Difficulty of Paying Living Expenses: Not hard at all  Food Insecurity: No Food Insecurity (05/27/2022)   Hunger Vital Sign    Worried About Running Out of Food in the Last Year: Never true    Ran Out of Food in the Last Year: Never true  Transportation Needs: No Transportation Needs (05/27/2022)   PRAPARE - Hydrologist (Medical): No    Lack of Transportation (Non-Medical): No  Physical Activity: Insufficiently Active (01/07/2019)   Exercise Vital Sign    Days of Exercise per Week: 2 days    Minutes of Exercise per Session: 10 min  Stress: No Stress Concern Present (05/27/2022)   [place]    Feeling of Stress : Not at all  Social Connections: Moderately Isolated (05/27/2022)   Social Connection and Isolation Panel [NHANES]    Frequency of Communication with Friends and Family: More than three times a week    Frequency of Social Gatherings with Friends and Family: More than three times a week    Attends Religious Services: More than 4 times per year    Active Member of [employer] or Organizations: No    Attends Archivist Meetings: Never    Marital Status: Widowed     Review of Systems  Constitutional:  Negative for appetite change and fever.  HENT:  Negative for congestion and sinus pressure.   Respiratory:  Negative for cough and chest tightness.        No increased cough.  Breathing stable.   Cardiovascular:  Negative for chest pain, palpitations and leg swelling.  Gastrointestinal:   Negative for abdominal pain, diarrhea, nausea and vomiting.  Genitourinary:  Negative for difficulty urinating and dysuria.  Musculoskeletal:  Negative for joint swelling and myalgias.  Skin:  Negative for color change and rash.  Neurological:  Negative for dizziness and headaches.  Psychiatric/Behavioral:  Negative for agitation and dysphoric mood.        Objective:     BP 130/76 (BP Location: Left Arm, Patient Position: Sitting, Cuff Size: Small)   Pulse 78   Temp 98.2 F (36.8 C) (Temporal)   Resp 15   Ht _0  (1.549 m)   Wt 139 lb 3.2 oz (63.1 kg)   SpO2 96%   BMI 26.30 kg/m  Wt Readings from Last 3 Encounters:  10/18/22  139 lb 3.2 oz (63.1 kg)  09/02/22 137 lb 9.6 oz (62.4 kg)  07/11/22 132 lb (59.9 kg)    Physical Exam Vitals reviewed.  Constitutional:      General: She is not in acute distress.    Appearance: Normal appearance.  HENT:     Head: Normocephalic and atraumatic.     Right Ear: External ear normal.     Left Ear: External ear normal.  Eyes:     General: No scleral icterus.       Right eye: No discharge.        Left eye: No discharge.     Conjunctiva/sclera: Conjunctivae normal.  Neck:     Thyroid: No thyromegaly.  Cardiovascular:     Rate and Rhythm: Normal rate and regular rhythm.  Pulmonary:     Effort: No respiratory distress.     Breath sounds: Normal breath sounds. No wheezing.     Comments: Breast (right) - raised lesion - small opening - able to express thickened substance easily.  Persistent fullness - some minimal erythema.  Minimal tenderness to palpation.   Abdominal:     General: Bowel sounds are normal.     Palpations: Abdomen is soft.     Tenderness: There is no abdominal tenderness.  Musculoskeletal:        General: No swelling or tenderness.     Cervical back: Neck supple. No tenderness.  Lymphadenopathy:     Cervical: No cervical adenopathy.  Skin:    Findings: No erythema or rash.  Neurological:     Mental Status: She  is alert.  Psychiatric:        Mood and Affect: Mood normal.        Behavior: Behavior normal.      Outpatient Encounter Medications as of 10/18/2022  Medication Sig   acetaminophen (TYLENOL) 500 MG tablet Take 500-1,000 mg by mouth every 6 (six) hours as needed for mild pain or moderate pain.   Albuterol Sulfate (PROAIR RESPICLICK) 536 (90 Base) MCG/ACT AEPB Inhale into the lungs every 4 (four) hours. PRN   amLODipine (NORVASC) 2.5 MG tablet Take 1 tablet by mouth once daily   aspirin 81 MG tablet Take 81 mg by mouth daily.   atorvastatin (LIPITOR) 80 MG tablet TAKE 1 TABLET BY MOUTH ONCE DAILY AT  6PM   donepezil (ARICEPT) 5 MG tablet Take by mouth.   doxycycline (VIBRA-TABS) 100 MG tablet Take 1 tablet (100 mg total) by mouth 2 (two) times daily.   folic acid (FOLVITE) 1 MG tablet Take 1 mg by mouth daily.   furosemide (LASIX) 40 MG tablet Take 1 tablet (40 mg total) by mouth daily as needed (weight gain 5 lbs over 1-2 days, lower extremity swelling, shortness of breath. If medication does not resolve symptoms in 2 days please call Dr / 911).   ipratropium-albuterol (DUONEB) 0.5-2.5 (3) MG/3ML SOLN Take 3 mLs by nebulization 2 (two) times daily as needed (shortness of breath or wheezing).   metFORMIN (GLUCOPHAGE) 500 MG tablet TAKE 1 TABLET BY MOUTH TWICE DAILY WITH A MEAL   methotrexate (RHEUMATREX) 2.5 MG tablet Take 12.5 mg by mouth every Saturday.   metoprolol tartrate (LOPRESSOR) 25 MG tablet Take 1 tablet (25 mg total) by mouth 2 (two) times daily.   mirtazapine (REMERON) 7.5 MG tablet 1/2 tablet q hs prn   Multiple Vitamins-Minerals (PRESERVISION AREDS 2) CAPS Take 1 capsule by mouth 2 (two) times daily.   nitroGLYCERIN (NITROSTAT) 0.4 MG SL tablet  Place 1 tablet (0.4 mg total) under the tongue every 5 (five) minutes as needed for chest pain.   ondansetron (ZOFRAN-ODT) 4 MG disintegrating tablet Take 4 mg by mouth 3 (three) times daily as needed for nausea or vomiting.   PROAIR  HFA 108 (90 Base) MCG/ACT inhaler Inhale 2 puffs into the lungs every 4 (four) hours as needed for wheezing or shortness of breath.   Vitamin D, Ergocalciferol, (DRISDOL) 50000 units CAPS capsule Take 50,000 Units by mouth every Friday.   No facility-administered encounter medications on file as of 10/18/2022.     Lab Results  Component Value Date   WBC 11.3 (H) 09/02/2022   HGB 12.4 09/02/2022   HCT 36.4 09/02/2022   PLT 291 09/02/2022   GLUCOSE 123 (H) 09/02/2022   CHOL 159 09/02/2022   TRIG 349 (H) 09/02/2022   HDL 52 09/02/2022   LDLDIRECT 136.0 07/03/2020   LDLCALC 65 09/02/2022   ALT 19 09/02/2022   AST 22 09/02/2022   NA 140 09/02/2022   K 4.3 09/02/2022   CL 103 09/02/2022   CREATININE 0.79 09/02/2022   BUN 15 09/02/2022   CO2 20 09/02/2022   TSH 2.064 05/07/2022   INR 1.1 05/29/2022   HGBA1C 6.7 (H) 09/02/2022   MICROALBUR 1.1 07/23/2021    US Venous Img Lower Unilateral Left  Result Date: 06/08/2022 CLINICAL DATA:  Left calf tenderness. EXAM: LEFT LOWER EXTREMITY VENOUS DOPPLER ULTRASOUND TECHNIQUE: Gray-scale sonography with compression, as well as color and duplex ultrasound, were performed to evaluate the deep venous system(s) from the level of the common femoral vein through the popliteal and proximal calf veins. COMPARISON:  None Available. FINDINGS: VENOUS Normal compressibility of the common femoral, superficial femoral, and popliteal veins, as well as the visualized calf veins. Visualized portions of profunda femoral vein and great saphenous vein unremarkable. No filling defects to suggest DVT on grayscale or color Doppler imaging. Doppler waveforms show normal direction of venous flow, normal respiratory plasticity and response to augmentation. Limited views of the contralateral common femoral vein are unremarkable. OTHER None. Limitations: None. IMPRESSION: Negative. Electronically Signed   By: Titus Dubin M.D.   On: 06/08/2022 13:28       Assessment &  Plan:  Breast lesion Assessment & Plan: Cyst - right breast.  Some tenderness to palpation.  Able to express some thickened material.  Minimal erythema.  No surrounding erythema.  Will place on doxycycline.  Continue warm compresses.  Refer to surgery for further evaluation.    Orders: -     Ambulatory referral to General Surgery  Atrial fibrillation, unspecified type [facility]) Assessment & Plan: Noted during June hospitalization.  On amiodarone.  Saw Dr Fletcher Anon 05/19/22 - decreased metoprolol to 52m bid.  Appears to be in SR. Follow.    Chronic diastolic CHF (congestive heart failure) (HCC) Assessment & Plan: No evidence of volume overload on exam.  Continues metoprolol.  Follow.    Chronic obstructive pulmonary disease, unspecified COPD type ([place] Assessment & Plan: Continue duonebs, breo and rescue inhaler as needed. Breathing stable.  Just saw Dr FRaul Del     Primary hypertension Assessment & Plan: Blood pressure as outlined. Continue metoprolol and amlodipine.  Follow pressures.  Follow metabolic panel.    Type 2 diabetes mellitus with cardiac complication ([facility] Assessment & Plan: On metformin.  Low carb diet.  Follow met b and a1c.    Other orders -     Doxycycline Hyclate; Take 1 tablet (100 mg total) by  mouth 2 (two) times daily.  Dispense: 14 tablet; Refill: 0     Einar Pheasant, MD

## 2022-10-19 ENCOUNTER — Encounter: Payer: Self-pay | Admitting: Internal Medicine

## 2022-10-19 DIAGNOSIS — N649 Disorder of breast, unspecified: Secondary | ICD-10-CM | POA: Insufficient documentation

## 2022-10-19 NOTE — Assessment & Plan Note (Signed)
Blood pressure as outlined. Continue metoprolol and amlodipine.  Follow pressures.  Follow metabolic panel.

## 2022-10-19 NOTE — Assessment & Plan Note (Signed)
Continue duonebs, breo and rescue inhaler as needed. Breathing stable.  Just saw Dr Meredeth Ide.

## 2022-10-19 NOTE — Assessment & Plan Note (Signed)
Noted during June hospitalization.  On amiodarone.  Saw Dr Kirke Corin 05/19/22 - decreased metoprolol to 25mg  bid.  Appears to be in SR. Follow.

## 2022-10-19 NOTE — Assessment & Plan Note (Signed)
No evidence of volume overload on exam.  Continues metoprolol.  Follow.

## 2022-10-19 NOTE — Assessment & Plan Note (Signed)
On metformin.  Low carb diet.  Follow met b and a1c.  

## 2022-10-19 NOTE — Assessment & Plan Note (Signed)
Cyst - right breast.  Some tenderness to palpation.  Able to express some thickened material.  Minimal erythema.  No surrounding erythema.  Will place on doxycycline.  Continue warm compresses.  Refer to surgery for further evaluation.

## 2022-10-20 MED ORDER — CYANOCOBALAMIN 1000 MCG/ML IJ SOLN
1000.0000 ug | Freq: Once | INTRAMUSCULAR | Status: AC
  Administered 2022-10-18: 1000 ug via INTRAMUSCULAR

## 2022-10-20 NOTE — Addendum Note (Signed)
Addended by: Lawrence Santiago A on: 10/20/2022 09:12 AM   Modules accepted: Orders

## 2022-10-21 ENCOUNTER — Ambulatory Visit: Payer: Medicare Other

## 2022-10-24 ENCOUNTER — Emergency Department: Payer: 59

## 2022-10-24 ENCOUNTER — Inpatient Hospital Stay
Admission: EM | Admit: 2022-10-24 | Discharge: 2022-11-14 | DRG: 871 | Disposition: A | Discharge status: Skilled Nursing Facility | Payer: 59 | Attending: Internal Medicine | Admitting: Internal Medicine

## 2022-10-24 ENCOUNTER — Encounter: Payer: Self-pay | Admitting: Emergency Medicine

## 2022-10-24 ENCOUNTER — Other Ambulatory Visit: Payer: Self-pay

## 2022-10-24 DIAGNOSIS — Z808 Family history of malignant neoplasm of other organs or systems: Secondary | ICD-10-CM

## 2022-10-24 DIAGNOSIS — Z8249 Family history of ischemic heart disease and other diseases of the circulatory system: Secondary | ICD-10-CM

## 2022-10-24 DIAGNOSIS — I10 Essential (primary) hypertension: Secondary | ICD-10-CM | POA: Diagnosis present

## 2022-10-24 DIAGNOSIS — Z825 Family history of asthma and other chronic lower respiratory diseases: Secondary | ICD-10-CM

## 2022-10-24 DIAGNOSIS — M353 Polymyalgia rheumatica: Secondary | ICD-10-CM | POA: Diagnosis present

## 2022-10-24 DIAGNOSIS — Z66 Do not resuscitate: Secondary | ICD-10-CM | POA: Diagnosis present

## 2022-10-24 DIAGNOSIS — R443 Hallucinations, unspecified: Secondary | ICD-10-CM | POA: Diagnosis present

## 2022-10-24 DIAGNOSIS — J4489 Other specified chronic obstructive pulmonary disease: Secondary | ICD-10-CM | POA: Diagnosis not present

## 2022-10-24 DIAGNOSIS — I480 Paroxysmal atrial fibrillation: Secondary | ICD-10-CM | POA: Diagnosis present

## 2022-10-24 DIAGNOSIS — R509 Fever, unspecified: Secondary | ICD-10-CM | POA: Diagnosis not present

## 2022-10-24 DIAGNOSIS — R000 Tachycardia, unspecified: Secondary | ICD-10-CM | POA: Diagnosis not present

## 2022-10-24 DIAGNOSIS — F0394 Unspecified dementia, unspecified severity, with anxiety: Secondary | ICD-10-CM | POA: Diagnosis not present

## 2022-10-24 DIAGNOSIS — R9431 Abnormal electrocardiogram [ECG] [EKG]: Secondary | ICD-10-CM | POA: Diagnosis not present

## 2022-10-24 DIAGNOSIS — I4891 Unspecified atrial fibrillation: Secondary | ICD-10-CM | POA: Diagnosis present

## 2022-10-24 DIAGNOSIS — R441 Visual hallucinations: Secondary | ICD-10-CM

## 2022-10-24 DIAGNOSIS — M6281 Muscle weakness (generalized): Secondary | ICD-10-CM | POA: Diagnosis not present

## 2022-10-24 DIAGNOSIS — R5381 Other malaise: Secondary | ICD-10-CM | POA: Diagnosis not present

## 2022-10-24 DIAGNOSIS — N390 Urinary tract infection, site not specified: Secondary | ICD-10-CM | POA: Diagnosis present

## 2022-10-24 DIAGNOSIS — I6381 Other cerebral infarction due to occlusion or stenosis of small artery: Secondary | ICD-10-CM | POA: Diagnosis not present

## 2022-10-24 DIAGNOSIS — G9341 Metabolic encephalopathy: Secondary | ICD-10-CM | POA: Diagnosis not present

## 2022-10-24 DIAGNOSIS — N3000 Acute cystitis without hematuria: Secondary | ICD-10-CM | POA: Diagnosis not present

## 2022-10-24 DIAGNOSIS — F32A Depression, unspecified: Secondary | ICD-10-CM | POA: Diagnosis present

## 2022-10-24 DIAGNOSIS — E7800 Pure hypercholesterolemia, unspecified: Secondary | ICD-10-CM | POA: Diagnosis present

## 2022-10-24 DIAGNOSIS — R296 Repeated falls: Secondary | ICD-10-CM | POA: Diagnosis not present

## 2022-10-24 DIAGNOSIS — Z7984 Long term (current) use of oral hypoglycemic drugs: Secondary | ICD-10-CM

## 2022-10-24 DIAGNOSIS — Z885 Allergy status to narcotic agent status: Secondary | ICD-10-CM

## 2022-10-24 DIAGNOSIS — Z7982 Long term (current) use of aspirin: Secondary | ICD-10-CM

## 2022-10-24 DIAGNOSIS — R059 Cough, unspecified: Secondary | ICD-10-CM | POA: Diagnosis not present

## 2022-10-24 DIAGNOSIS — R410 Disorientation, unspecified: Secondary | ICD-10-CM | POA: Diagnosis not present

## 2022-10-24 DIAGNOSIS — I252 Old myocardial infarction: Secondary | ICD-10-CM | POA: Diagnosis not present

## 2022-10-24 DIAGNOSIS — A4151 Sepsis due to Escherichia coli [E. coli]: Principal | ICD-10-CM | POA: Diagnosis present

## 2022-10-24 DIAGNOSIS — I959 Hypotension, unspecified: Secondary | ICD-10-CM | POA: Diagnosis not present

## 2022-10-24 DIAGNOSIS — E119 Type 2 diabetes mellitus without complications: Secondary | ICD-10-CM | POA: Diagnosis present

## 2022-10-24 DIAGNOSIS — I5032 Chronic diastolic (congestive) heart failure: Secondary | ICD-10-CM | POA: Diagnosis not present

## 2022-10-24 DIAGNOSIS — M8580 Other specified disorders of bone density and structure, unspecified site: Secondary | ICD-10-CM | POA: Diagnosis present

## 2022-10-24 DIAGNOSIS — R4182 Altered mental status, unspecified: Principal | ICD-10-CM

## 2022-10-24 DIAGNOSIS — Z1152 Encounter for screening for COVID-19: Secondary | ICD-10-CM

## 2022-10-24 DIAGNOSIS — E1159 Type 2 diabetes mellitus with other circulatory complications: Secondary | ICD-10-CM | POA: Diagnosis not present

## 2022-10-24 DIAGNOSIS — F03918 Unspecified dementia, unspecified severity, with other behavioral disturbance: Secondary | ICD-10-CM | POA: Diagnosis not present

## 2022-10-24 DIAGNOSIS — Z9181 History of falling: Secondary | ICD-10-CM

## 2022-10-24 DIAGNOSIS — J449 Chronic obstructive pulmonary disease, unspecified: Secondary | ICD-10-CM | POA: Diagnosis not present

## 2022-10-24 DIAGNOSIS — Z515 Encounter for palliative care: Secondary | ICD-10-CM | POA: Diagnosis not present

## 2022-10-24 DIAGNOSIS — K5900 Constipation, unspecified: Secondary | ICD-10-CM | POA: Diagnosis not present

## 2022-10-24 DIAGNOSIS — M130 Polyarthritis, unspecified: Secondary | ICD-10-CM | POA: Diagnosis not present

## 2022-10-24 DIAGNOSIS — I110 Hypertensive heart disease with heart failure: Secondary | ICD-10-CM | POA: Diagnosis not present

## 2022-10-24 DIAGNOSIS — I2510 Atherosclerotic heart disease of native coronary artery without angina pectoris: Secondary | ICD-10-CM | POA: Diagnosis not present

## 2022-10-24 DIAGNOSIS — M810 Age-related osteoporosis without current pathological fracture: Secondary | ICD-10-CM | POA: Diagnosis not present

## 2022-10-24 DIAGNOSIS — F0392 Unspecified dementia, unspecified severity, with psychotic disturbance: Secondary | ICD-10-CM | POA: Diagnosis not present

## 2022-10-24 DIAGNOSIS — R6889 Other general symptoms and signs: Secondary | ICD-10-CM | POA: Diagnosis not present

## 2022-10-24 DIAGNOSIS — Z820 Family history of epilepsy and other diseases of the nervous system: Secondary | ICD-10-CM

## 2022-10-24 DIAGNOSIS — Z751 Person awaiting admission to adequate facility elsewhere: Secondary | ICD-10-CM

## 2022-10-24 DIAGNOSIS — Z955 Presence of coronary angioplasty implant and graft: Secondary | ICD-10-CM

## 2022-10-24 DIAGNOSIS — E559 Vitamin D deficiency, unspecified: Secondary | ICD-10-CM | POA: Diagnosis not present

## 2022-10-24 DIAGNOSIS — J9811 Atelectasis: Secondary | ICD-10-CM | POA: Diagnosis not present

## 2022-10-24 DIAGNOSIS — F05 Delirium due to known physiological condition: Secondary | ICD-10-CM | POA: Diagnosis present

## 2022-10-24 DIAGNOSIS — F0393 Unspecified dementia, unspecified severity, with mood disturbance: Secondary | ICD-10-CM | POA: Diagnosis not present

## 2022-10-24 DIAGNOSIS — Z743 Need for continuous supervision: Secondary | ICD-10-CM | POA: Diagnosis not present

## 2022-10-24 DIAGNOSIS — F19951 Other psychoactive substance use, unspecified with psychoactive substance-induced psychotic disorder with hallucinations: Secondary | ICD-10-CM | POA: Diagnosis present

## 2022-10-24 DIAGNOSIS — Z8261 Family history of arthritis: Secondary | ICD-10-CM

## 2022-10-24 DIAGNOSIS — Z7189 Other specified counseling: Secondary | ICD-10-CM | POA: Diagnosis not present

## 2022-10-24 DIAGNOSIS — Z79899 Other long term (current) drug therapy: Secondary | ICD-10-CM

## 2022-10-24 LAB — CBC
HCT: 37.1 % (ref 36.0–46.0)
Hemoglobin: 12 g/dL (ref 12.0–15.0)
MCH: 30.3 pg (ref 26.0–34.0)
MCHC: 32.3 g/dL (ref 30.0–36.0)
MCV: 93.7 fL (ref 80.0–100.0)
Platelets: 313 10*3/uL (ref 150–400)
RBC: 3.96 MIL/uL (ref 3.87–5.11)
RDW: 15.3 % (ref 11.5–15.5)
WBC: 11.9 10*3/uL — ABNORMAL HIGH (ref 4.0–10.5)
nRBC: 0 % (ref 0.0–0.2)

## 2022-10-24 LAB — URINALYSIS, ROUTINE W REFLEX MICROSCOPIC
Bilirubin Urine: NEGATIVE
Glucose, UA: NEGATIVE mg/dL
Hgb urine dipstick: NEGATIVE
Ketones, ur: NEGATIVE mg/dL
Nitrite: NEGATIVE
Protein, ur: 100 mg/dL — AB
Specific Gravity, Urine: 1.016 (ref 1.005–1.030)
pH: 5 (ref 5.0–8.0)

## 2022-10-24 LAB — RESP PANEL BY RT-PCR (RSV, FLU A&B, COVID)  RVPGX2
Influenza A by PCR: NEGATIVE
Influenza B by PCR: NEGATIVE
Resp Syncytial Virus by PCR: NEGATIVE
SARS Coronavirus 2 by RT PCR: NEGATIVE

## 2022-10-24 LAB — COMPREHENSIVE METABOLIC PANEL
ALT: 20 U/L (ref 0–44)
AST: 29 U/L (ref 15–41)
Albumin: 4.2 g/dL (ref 3.5–5.0)
Alkaline Phosphatase: 51 U/L (ref 38–126)
Anion gap: 14 (ref 5–15)
BUN: 25 mg/dL — ABNORMAL HIGH (ref 8–23)
CO2: 22 mmol/L (ref 22–32)
Calcium: 9.4 mg/dL (ref 8.9–10.3)
Chloride: 104 mmol/L (ref 98–111)
Creatinine, Ser: 0.9 mg/dL (ref 0.44–1.00)
GFR, Estimated: >60 mL/min (ref >60)
Glucose, Bld: 128 mg/dL — ABNORMAL HIGH (ref 70–99)
Potassium: 4.2 mmol/L (ref 3.5–5.1)
Sodium: 140 mmol/L (ref 135–145)
Total Bilirubin: 1.1 mg/dL (ref 0.3–1.2)
Total Protein: 7 g/dL (ref 6.5–8.1)

## 2022-10-24 NOTE — ED Provider Notes (Signed)
[facility] Provider Note    Event Date/Time   First MD Initiated Contact with Patient 10/24/22 2123     (approximate)   History   Altered Mental Status   HPI  Tanya Cole is a 88 y.o. female   Past medical history of dementia, CAD, heart failure, diabetes, hypertension hypercholesterolemia, polymyalgia rheumatica, who presents to the emergency department with altered mental status and hallucinations for the past 2 days.  Usually lives at home with her sister and has had an acute decline in her cognition over the last 2 days.  She has had a headache, no trauma, no fever or chills, no nausea or vomiting, no abdominal pain, no GU symptoms.  Daughter is at bedside who I speak with who states that these acute declines in cognition are usually attributable to either medication changes or urinary tract infections.  She was recent seen by her primary doctor for a cyst in between her breasts with some cellulitic changes and is nearing the end of a course of doxycycline with much improvement of the erythema around the cyst.  History was obtained via limited information obtained by the patient who is disoriented.  Daughter Rinaldo Cloud is at bedside to offer much of the history gathered above.  I reviewed external medical notes including a primary doctor visit earlier this month for cyst on the chest and prescribed doxycycline.      Physical Exam   Triage Vital Signs: ED Triage Vitals  Enc Vitals Group     BP 10/24/22 2144 (!) 158/67     Pulse Rate 10/24/22 2144 82     Resp 10/24/22 2144 20     Temp 10/24/22 2144 99 F (37.2 C)     Temp Source 10/24/22 2144 Oral     SpO2 10/24/22 2144 96 %     Weight 10/24/22 2145 138 lb 14.2 oz (63 kg)     Height 10/24/22 2145 5\' 1"  (1.549 m)     Head Circumference --      Peak Flow --      Pain Score 10/24/22 2145 0     Pain Loc --      Pain Edu? --      Excl. in GC? --     Most recent vital signs: Vitals:    10/24/22 2144  BP: (!) 158/67  Pulse: 82  Resp: 20  Temp: 99 F (37.2 C)  SpO2: 96%    General: Awake, no distress.  CV:  Good peripheral perfusion.  Resp:  Normal effort.  Abd:  No distention.  Other:  Lungs clear, abdomen soft and nontender, hemodynamics appropriate and reassuring, no fever.  No hypoxemia.  Appears comfortable in bed nontoxic moving all extremities no dysarthria no facial asymmetry.  She is disoriented to time but is aware of situation and self.  She states that she lives with her mother but then quickly corrects herself stating that she was incorrect and that her mother is dead.  The cyst in the middle of her chest is present but there is no surrounding cellulitic changes and both the patient and the daughter state that the previous cellulitic changes have resolved.   ED Results / Procedures / Treatments   Labs (all labs ordered are listed, but only abnormal results are displayed) Labs Reviewed  COMPREHENSIVE METABOLIC PANEL - Abnormal; Notable for the following components:      Result Value   Glucose, Bld 128 (*)    BUN 25 (*)  All other components within normal limits  CBC - Abnormal; Notable for the following components:   WBC 11.9 (*)    All other components within normal limits  RESP PANEL BY RT-PCR (RSV, FLU A&B, COVID)  [id number]  URINALYSIS, ROUTINE W REFLEX MICROSCOPIC     PROCEDURES:  Critical Care performed: No  Procedures   MEDICATIONS ORDERED IN ED: Medications - No data to display  IMPRESSION / MDM / ASSESSMENT AND PLAN / ED COURSE  I reviewed the triage vital signs and the nursing notes.                              Differential diagnosis includes, but is not limited to, infection, metabolic derangement, AKI, cellulitis, intracranial pathology like CVA or bleed, worsening dementia side effect of medication   MDM: This is a patient with acute altered mental status without trauma or focal infectious symptoms but history of UTIs in  the past with similar presentation.  Will obtain labs and UA as well as chest x-ray and respiratory viral panel.  CT of the head to rule out intracranial pathologies. Signed out to oncoming nighttime provider; as workup is pending.  Patient stable.    Patient's presentation is most consistent with acute presentation with potential threat to life or bodily function.       FINAL CLINICAL IMPRESSION(S) / ED DIAGNOSES   Final diagnoses:  Altered mental status, unspecified altered mental status type  Hallucinations     Rx / DC Orders   ED Discharge Orders     None        Note:  This document was prepared using Dragon voice recognition software and may include unintentional dictation errors.    Pilar Jarvis, MD 10/24/22 [phone]

## 2022-10-24 NOTE — ED Triage Notes (Signed)
Patient to ER from home via [facility] per family call out. Patient has h/o dementia and per family to EMS tends to cry a lot at this time of night. However, patient has been having visual hallucinations and seeing chickens per family. Patient tearful but appropriate.

## 2022-10-24 NOTE — ED Notes (Signed)
Sent urine sample to lab 

## 2022-10-25 ENCOUNTER — Inpatient Hospital Stay: Payer: 59

## 2022-10-25 DIAGNOSIS — F03918 Unspecified dementia, unspecified severity, with other behavioral disturbance: Secondary | ICD-10-CM | POA: Diagnosis present

## 2022-10-25 DIAGNOSIS — E119 Type 2 diabetes mellitus without complications: Secondary | ICD-10-CM | POA: Diagnosis present

## 2022-10-25 DIAGNOSIS — R9431 Abnormal electrocardiogram [ECG] [EKG]: Secondary | ICD-10-CM

## 2022-10-25 DIAGNOSIS — I110 Hypertensive heart disease with heart failure: Secondary | ICD-10-CM | POA: Diagnosis present

## 2022-10-25 DIAGNOSIS — Z515 Encounter for palliative care: Secondary | ICD-10-CM | POA: Diagnosis not present

## 2022-10-25 DIAGNOSIS — I252 Old myocardial infarction: Secondary | ICD-10-CM | POA: Diagnosis not present

## 2022-10-25 DIAGNOSIS — G9341 Metabolic encephalopathy: Secondary | ICD-10-CM

## 2022-10-25 DIAGNOSIS — N390 Urinary tract infection, site not specified: Secondary | ICD-10-CM | POA: Diagnosis present

## 2022-10-25 DIAGNOSIS — E7800 Pure hypercholesterolemia, unspecified: Secondary | ICD-10-CM | POA: Diagnosis present

## 2022-10-25 DIAGNOSIS — I5032 Chronic diastolic (congestive) heart failure: Secondary | ICD-10-CM | POA: Diagnosis present

## 2022-10-25 DIAGNOSIS — I10 Essential (primary) hypertension: Secondary | ICD-10-CM | POA: Diagnosis not present

## 2022-10-25 DIAGNOSIS — Z8249 Family history of ischemic heart disease and other diseases of the circulatory system: Secondary | ICD-10-CM | POA: Diagnosis not present

## 2022-10-25 DIAGNOSIS — F32A Depression, unspecified: Secondary | ICD-10-CM | POA: Diagnosis present

## 2022-10-25 DIAGNOSIS — F0392 Unspecified dementia, unspecified severity, with psychotic disturbance: Secondary | ICD-10-CM | POA: Diagnosis present

## 2022-10-25 DIAGNOSIS — R410 Disorientation, unspecified: Secondary | ICD-10-CM | POA: Diagnosis present

## 2022-10-25 DIAGNOSIS — J449 Chronic obstructive pulmonary disease, unspecified: Secondary | ICD-10-CM | POA: Diagnosis not present

## 2022-10-25 DIAGNOSIS — F0393 Unspecified dementia, unspecified severity, with mood disturbance: Secondary | ICD-10-CM | POA: Diagnosis present

## 2022-10-25 DIAGNOSIS — A4151 Sepsis due to Escherichia coli [E. coli]: Secondary | ICD-10-CM | POA: Diagnosis present

## 2022-10-25 DIAGNOSIS — M8580 Other specified disorders of bone density and structure, unspecified site: Secondary | ICD-10-CM | POA: Diagnosis present

## 2022-10-25 DIAGNOSIS — R4182 Altered mental status, unspecified: Secondary | ICD-10-CM | POA: Diagnosis present

## 2022-10-25 DIAGNOSIS — Z66 Do not resuscitate: Secondary | ICD-10-CM | POA: Diagnosis present

## 2022-10-25 DIAGNOSIS — Z7189 Other specified counseling: Secondary | ICD-10-CM | POA: Diagnosis not present

## 2022-10-25 DIAGNOSIS — I2510 Atherosclerotic heart disease of native coronary artery without angina pectoris: Secondary | ICD-10-CM | POA: Diagnosis present

## 2022-10-25 DIAGNOSIS — I4891 Unspecified atrial fibrillation: Secondary | ICD-10-CM | POA: Diagnosis not present

## 2022-10-25 DIAGNOSIS — F05 Delirium due to known physiological condition: Secondary | ICD-10-CM | POA: Diagnosis present

## 2022-10-25 DIAGNOSIS — R296 Repeated falls: Secondary | ICD-10-CM | POA: Diagnosis present

## 2022-10-25 DIAGNOSIS — R5381 Other malaise: Secondary | ICD-10-CM | POA: Diagnosis not present

## 2022-10-25 DIAGNOSIS — J4489 Other specified chronic obstructive pulmonary disease: Secondary | ICD-10-CM | POA: Diagnosis present

## 2022-10-25 DIAGNOSIS — E1159 Type 2 diabetes mellitus with other circulatory complications: Secondary | ICD-10-CM | POA: Diagnosis not present

## 2022-10-25 DIAGNOSIS — K5900 Constipation, unspecified: Secondary | ICD-10-CM | POA: Diagnosis not present

## 2022-10-25 DIAGNOSIS — Z1152 Encounter for screening for COVID-19: Secondary | ICD-10-CM | POA: Diagnosis not present

## 2022-10-25 DIAGNOSIS — R441 Visual hallucinations: Secondary | ICD-10-CM | POA: Diagnosis not present

## 2022-10-25 DIAGNOSIS — R509 Fever, unspecified: Secondary | ICD-10-CM | POA: Diagnosis not present

## 2022-10-25 DIAGNOSIS — I480 Paroxysmal atrial fibrillation: Secondary | ICD-10-CM | POA: Diagnosis present

## 2022-10-25 DIAGNOSIS — F0394 Unspecified dementia, unspecified severity, with anxiety: Secondary | ICD-10-CM | POA: Diagnosis present

## 2022-10-25 DIAGNOSIS — N3000 Acute cystitis without hematuria: Secondary | ICD-10-CM | POA: Diagnosis not present

## 2022-10-25 DIAGNOSIS — M353 Polymyalgia rheumatica: Secondary | ICD-10-CM | POA: Diagnosis present

## 2022-10-25 DIAGNOSIS — R443 Hallucinations, unspecified: Secondary | ICD-10-CM | POA: Diagnosis not present

## 2022-10-25 LAB — CBG MONITORING, ED
Glucose-Capillary: 152 mg/dL — ABNORMAL HIGH (ref 70–99)
Glucose-Capillary: 193 mg/dL — ABNORMAL HIGH (ref 70–99)
Glucose-Capillary: 150 mg/dL — ABNORMAL HIGH (ref 70–99)
Glucose-Capillary: 136 mg/dL — ABNORMAL HIGH (ref 70–99)

## 2022-10-25 LAB — MAGNESIUM
Magnesium: 1.1 mg/dL — ABNORMAL LOW (ref 1.7–2.4)
Magnesium: 2.5 mg/dL — ABNORMAL HIGH (ref 1.7–2.4)

## 2022-10-25 MED ORDER — ATORVASTATIN CALCIUM 20 MG PO TABS
40.0000 mg | ORAL_TABLET | Freq: Every day | ORAL | Status: DC
  Administered 2022-10-26 – 2022-11-14 (×20): 40 mg via ORAL
  Filled 2022-10-25 (×21): qty 2

## 2022-10-25 MED ORDER — MAGNESIUM SULFATE 2 GM/50ML IV SOLN
2.0000 g | Freq: Once | INTRAVENOUS | Status: AC
  Administered 2022-10-25: 2 g via INTRAVENOUS
  Filled 2022-10-25: qty 50

## 2022-10-25 MED ORDER — QUETIAPINE FUMARATE 25 MG PO TABS
100.0000 mg | ORAL_TABLET | Freq: Once | ORAL | Status: AC
  Administered 2022-10-25: 100 mg via ORAL
  Filled 2022-10-25: qty 4

## 2022-10-25 MED ORDER — ZIPRASIDONE MESYLATE 20 MG IM SOLR
20.0000 mg | Freq: Four times a day (QID) | INTRAMUSCULAR | Status: DC | PRN
  Administered 2022-10-26 – 2022-11-14 (×18): 20 mg via INTRAMUSCULAR
  Filled 2022-10-25 (×20): qty 20

## 2022-10-25 MED ORDER — ZIPRASIDONE MESYLATE 20 MG IM SOLR
INTRAMUSCULAR | Status: AC
  Filled 2022-10-25: qty 20

## 2022-10-25 MED ORDER — METHOTREXATE 2.5 MG PO TABS: 12.5000 mg | ORAL_TABLET | ORAL | Status: DC

## 2022-10-25 MED ORDER — ZIPRASIDONE MESYLATE 20 MG IM SOLR
10.0000 mg | Freq: Once | INTRAMUSCULAR | Status: AC
  Administered 2022-10-25: 10 mg via INTRAMUSCULAR

## 2022-10-25 MED ORDER — ENOXAPARIN SODIUM 40 MG/0.4ML IJ SOSY
40.0000 mg | PREFILLED_SYRINGE | INTRAMUSCULAR | Status: DC
  Administered 2022-10-26 – 2022-11-14 (×20): 40 mg via SUBCUTANEOUS
  Filled 2022-10-25 (×21): qty 0.4

## 2022-10-25 MED ORDER — ZIPRASIDONE MESYLATE 20 MG IM SOLR
10.0000 mg | Freq: Once | INTRAMUSCULAR | Status: AC
  Administered 2022-10-25: 10 mg via INTRAMUSCULAR
  Filled 2022-10-25: qty 20

## 2022-10-25 MED ORDER — LORAZEPAM 2 MG/ML IJ SOLN
1.0000 mg | INTRAMUSCULAR | Status: DC | PRN
  Administered 2022-10-25 – 2022-10-27 (×6): 1 mg via INTRAVENOUS
  Filled 2022-10-25 (×6): qty 1

## 2022-10-25 MED ORDER — SODIUM CHLORIDE 0.9 % IV BOLUS (SEPSIS)
500.0000 mL | Freq: Once | INTRAVENOUS | Status: AC
  Administered 2022-10-25: 500 mL via INTRAVENOUS

## 2022-10-25 MED ORDER — SODIUM CHLORIDE 0.9 % IV SOLN
1.0000 g | INTRAVENOUS | Status: AC
  Administered 2022-10-25 – 2022-10-29 (×5): 1 g via INTRAVENOUS
  Filled 2022-10-25 (×5): qty 10

## 2022-10-25 MED ORDER — AMLODIPINE BESYLATE 5 MG PO TABS
2.5000 mg | ORAL_TABLET | Freq: Every day | ORAL | Status: DC
  Administered 2022-10-26 – 2022-11-14 (×20): 2.5 mg via ORAL
  Filled 2022-10-25 (×21): qty 1

## 2022-10-25 MED ORDER — ALBUTEROL SULFATE 108 (90 BASE) MCG/ACT IN AEPB: INHALATION_SPRAY | RESPIRATORY_TRACT | Status: DC

## 2022-10-25 MED ORDER — ALBUTEROL SULFATE (2.5 MG/3ML) 0.083% IN NEBU: 2.5000 mg | INHALATION_SOLUTION | RESPIRATORY_TRACT | Status: DC | PRN

## 2022-10-25 MED ORDER — SODIUM CHLORIDE 0.9 % IV SOLN: 1.0000 g | Freq: Once | INTRAVENOUS | Status: DC

## 2022-10-25 MED ORDER — ACETAMINOPHEN 325 MG PO TABS
650.0000 mg | ORAL_TABLET | Freq: Four times a day (QID) | ORAL | Status: DC | PRN
  Administered 2022-10-26 – 2022-11-14 (×16): 650 mg via ORAL
  Filled 2022-10-25 (×16): qty 2

## 2022-10-25 MED ORDER — INSULIN ASPART 100 UNIT/ML IJ SOLN
0.0000 [IU] | Freq: Every day | INTRAMUSCULAR | Status: DC
  Administered 2022-11-06 – 2022-11-12 (×2): 2 [IU] via SUBCUTANEOUS
  Filled 2022-10-25 (×2): qty 1

## 2022-10-25 MED ORDER — ASPIRIN 81 MG PO TBEC
81.0000 mg | DELAYED_RELEASE_TABLET | Freq: Every day | ORAL | Status: DC
  Administered 2022-10-26 – 2022-10-30 (×5): 81 mg via ORAL
  Filled 2022-10-25 (×6): qty 1

## 2022-10-25 MED ORDER — METHOTREXATE SODIUM 2.5 MG PO TABS
12.5000 mg | ORAL_TABLET | ORAL | Status: DC
  Administered 2022-10-29 – 2022-11-12 (×3): 12.5 mg via ORAL
  Filled 2022-10-25 (×3): qty 5

## 2022-10-25 MED ORDER — INSULIN ASPART 100 UNIT/ML IJ SOLN
0.0000 [IU] | Freq: Three times a day (TID) | INTRAMUSCULAR | Status: DC
  Administered 2022-10-25 (×2): 3 [IU] via SUBCUTANEOUS
  Administered 2022-10-25: 2 [IU] via SUBCUTANEOUS
  Administered 2022-10-26 – 2022-10-27 (×6): 3 [IU] via SUBCUTANEOUS
  Administered 2022-10-28: 2 [IU] via SUBCUTANEOUS
  Administered 2022-10-28 (×2): 3 [IU] via SUBCUTANEOUS
  Administered 2022-10-29: 2 [IU] via SUBCUTANEOUS
  Administered 2022-10-29: 5 [IU] via SUBCUTANEOUS
  Administered 2022-10-29: 3 [IU] via SUBCUTANEOUS
  Administered 2022-10-30: 2 [IU] via SUBCUTANEOUS
  Administered 2022-10-30 – 2022-10-31 (×3): 3 [IU] via SUBCUTANEOUS
  Administered 2022-10-31: 2 [IU] via SUBCUTANEOUS
  Administered 2022-10-31: 5 [IU] via SUBCUTANEOUS
  Administered 2022-11-01 (×3): 3 [IU] via SUBCUTANEOUS
  Administered 2022-11-02: 2 [IU] via SUBCUTANEOUS
  Administered 2022-11-02 (×2): 3 [IU] via SUBCUTANEOUS
  Administered 2022-11-03: 2 [IU] via SUBCUTANEOUS
  Administered 2022-11-03: 3 [IU] via SUBCUTANEOUS
  Administered 2022-11-03: 2 [IU] via SUBCUTANEOUS
  Administered 2022-11-04: 5 [IU] via SUBCUTANEOUS
  Administered 2022-11-04 (×2): 3 [IU] via SUBCUTANEOUS
  Administered 2022-11-05: 5 [IU] via SUBCUTANEOUS
  Administered 2022-11-05: 2 [IU] via SUBCUTANEOUS
  Administered 2022-11-05 – 2022-11-06 (×2): 3 [IU] via SUBCUTANEOUS
  Administered 2022-11-06: 5 [IU] via SUBCUTANEOUS
  Administered 2022-11-06 – 2022-11-07 (×3): 3 [IU] via SUBCUTANEOUS
  Administered 2022-11-07 – 2022-11-10 (×9): 2 [IU] via SUBCUTANEOUS
  Administered 2022-11-10: 3 [IU] via SUBCUTANEOUS
  Administered 2022-11-11: 2 [IU] via SUBCUTANEOUS
  Administered 2022-11-11: 3 [IU] via SUBCUTANEOUS
  Administered 2022-11-11: 2 [IU] via SUBCUTANEOUS
  Administered 2022-11-12: 3 [IU] via SUBCUTANEOUS
  Administered 2022-11-12 (×2): 2 [IU] via SUBCUTANEOUS
  Administered 2022-11-13 – 2022-11-14 (×4): 3 [IU] via SUBCUTANEOUS
  Filled 2022-10-25 (×61): qty 1

## 2022-10-25 MED ORDER — NITROGLYCERIN 0.4 MG SL SUBL: 0.4000 mg | SUBLINGUAL_TABLET | SUBLINGUAL | Status: DC | PRN

## 2022-10-25 MED ORDER — METOPROLOL TARTRATE 25 MG PO TABS
25.0000 mg | ORAL_TABLET | Freq: Two times a day (BID) | ORAL | Status: DC
  Administered 2022-10-25 – 2022-11-14 (×40): 25 mg via ORAL
  Filled 2022-10-25 (×41): qty 1

## 2022-10-25 MED ORDER — ACETAMINOPHEN 650 MG RE SUPP: 650.0000 mg | Freq: Four times a day (QID) | RECTAL | Status: DC | PRN

## 2022-10-25 NOTE — Assessment & Plan Note (Addendum)
QT of 565 Avoid QT prolonging drugs Continuous cardiac monitoring

## 2022-10-25 NOTE — H&P (Signed)
History and Physical    Patient: Tanya Cole BPZ:[id number] DOB: Mar 01, 1933 DOA: 10/24/2022 DOS: the patient was seen and examined on 10/25/2022 PCP: Dale [place], MD  Patient coming from: Home  Chief Complaint:  Chief Complaint  Patient presents with   Altered Mental Status    HPI: Tanya Cole is a 88 y.o. female with medical history significant for DM, HTN, diastolic CHF, CAD, polymyalgia rheumatica, paroxysmal atrial fibrillation not on anticoagulation due to high fall risk dementia who was brought to the ED with confusion x 1 to 2 days beyond her baseline associated with visual hallucinations.  Family reports patient has been seeing chickens and crying a lot.  She has had no vomiting or diarrhea, cough or shortness of breath or fever.  Patient has presented similarly and her hallucinations and confusion were attributed on separate occasions to infection, to steroid induced psychosis and most recently during her hospitalization in July 2023, to Seroquel at which time she was switched to Ativan.  Of note, patient recently completed a course of doxycycline for an infected cyst between her breasts.  Her last positive urine culture on record was from July when she grew VRE. ED course and data review: BP 158/67 with otherwise normal vitals.  WBC 11,900.  COVID flu and RSV negative.  Urinalysis with moderate leukocyte esterase and rare bacteria.  CMP unremarkable. EKG, personally viewed and interpreted Showed sinus at 90 with nonspecific ST-T wave changes with prolonged QT of 565 CT head nonacute and chest x-ray showing pulmonary hypoinflation. Patient started on ceftriaxone for possible UTI.  Hospitalist consulted for admission.      Past Medical History:  Diagnosis Date   (HFpEF) heart failure with preserved ejection fraction (HCC)    a. TTE 12/19: EF 55-60%, probable HK of the mid apical anterior septal myocardium, Gr1DD, mild AI, mildly dilated LA; b.07/2020 Echo: EF  60-65%, no rwma, Gr2 DD. Nl RV fxn. Mildly dil LA. Mild MR; c. 10/2021 Echo: EF 60-65%, no rwma, mild LVH, nl RV fxn, mod elev PASP, mildly dil LA, mild MR, Ao sclerosis.   Asthma    CAD (coronary artery disease)    a. 09/2018 NSTEMI/PCI: LM min irregs, mLAD 95 (PCI/DES), mLAD-2 60%, LCx mild diff dzs, RCA min irregs; b. 03/2020 MV: EF>65%, no ischemia/scar; c. 07/2020 Cath: LM min irregs, LAD 30p, 51m, 90d, D1/2 min irregs, LCX diff dzs throughout, OM1/2/3 mild dzs, RCA 30p, RPDA/RPAV min irrges. EF 55-65%.   CHF (congestive heart failure) (HCC)    Diabetes mellitus (HCC)    Hypercholesterolemia    Hypertension    Myocardial infarction (HCC)    Osteopenia    Palpitations    a. 04/2020 Zio: Avg HR 75. 429 SVT episodes, longest 19 secs @ 133. Occas PACs (3.2%). Rare PVCs (<1%).   Polymyalgia rheumatica syndrome (HCC)    Reactive airway disease    Severe sepsis [facility])    Past Surgical History:  Procedure Laterality Date   ABDOMINAL HYSTERECTOMY  11/01/1979   prolapse and bleeding, ovaries not removed   BREAST EXCISIONAL BIOPSY Right    CATARACT EXTRACTION Right    CHOLECYSTECTOMY N/A 09/02/2019   Procedure: LAPAROSCOPIC CHOLECYSTECTOMY WITH INTRAOPERATIVE CHOLANGIOGRAM;  Surgeon: Henrene Dodge, MD;  Location: [facility] ORS;  Service: General;  Laterality: N/A;   CORONARY STENT INTERVENTION N/A 10/08/2018   Procedure: CORONARY STENT INTERVENTION;  Surgeon: Iran Ouch, MD;  Location: [facility] INVASIVE CV LAB;  Service: Cardiovascular;  Laterality: N/A;   ENDOSCOPIC RETROGRADE CHOLANGIOPANCREATOGRAPHY (ERCP)  WITH PROPOFOL N/A 08/08/2019   Procedure: ENDOSCOPIC RETROGRADE CHOLANGIOPANCREATOGRAPHY (ERCP) WITH PROPOFOL;  Surgeon: Midge Minium, MD;  Location: [facility] ENDOSCOPY;  Service: Endoscopy;  Laterality: N/A;   LEFT HEART CATH AND CORONARY ANGIOGRAPHY N/A 10/08/2018   Procedure: LEFT HEART CATH AND CORONARY ANGIOGRAPHY;  Surgeon: Iran Ouch, MD;  Location: [facility] INVASIVE CV LAB;  Service:  Cardiovascular;  Laterality: N/A;   LEFT HEART CATH AND CORONARY ANGIOGRAPHY N/A 08/10/2020   Procedure: LEFT HEART CATH AND CORONARY ANGIOGRAPHY possible percutaneous intervention;  Surgeon: Iran Ouch, MD;  Location: [facility] INVASIVE CV LAB;  Service: Cardiovascular;  Laterality: N/A;   UMBILICAL HERNIA REPAIR  04/30/1993   Social History:  reports that she has never smoked. She has never used smokeless tobacco. She reports that she does not drink alcohol and does not use drugs.  Allergies  Allergen Reactions   Tramadol Itching and Nausea And Vomiting    Family History  Problem Relation Age of Onset   Arthritis Mother    Heart disease Mother    Heart attack Father    Throat cancer Sister    Parkinson's disease Sister    COPD Brother    COPD Brother     Prior to Admission medications   Medication Sig Start Date End Date Taking? Authorizing Provider  acetaminophen (TYLENOL) 500 MG tablet Take 500-1,000 mg by mouth every 6 (six) hours as needed for mild pain or moderate pain.    [provider]  Albuterol Sulfate (PROAIR RESPICLICK) 108 (90 Base) MCG/ACT AEPB Inhale into the lungs every 4 (four) hours. PRN    [provider]  amLODipine (NORVASC) 2.5 MG tablet Take 1 tablet by mouth once daily 10/11/22   Dale [place], MD  aspirin 81 MG tablet Take 81 mg by mouth daily.    [provider]  atorvastatin (LIPITOR) 80 MG tablet TAKE 1 TABLET BY MOUTH ONCE DAILY AT  6PM 05/04/21   Alver Sorrow, NP  donepezil (ARICEPT) 5 MG tablet Take by mouth. 06/22/22 12/19/22  [provider]  doxycycline (VIBRA-TABS) 100 MG tablet Take 1 tablet (100 mg total) by mouth 2 (two) times daily. 10/18/22   Dale [place], MD  folic acid (FOLVITE) 1 MG tablet Take 1 mg by mouth daily.    [provider]  furosemide (LASIX) 40 MG tablet Take 1 tablet (40 mg total) by mouth daily as needed (weight gain 5 lbs over 1-2 days, lower extremity swelling, shortness  of breath. If medication does not resolve symptoms in 2 days please call Dr / 911). 06/01/22   Cipriano Bunker, MD  ipratropium-albuterol (DUONEB) 0.5-2.5 (3) MG/3ML SOLN Take 3 mLs by nebulization 2 (two) times daily as needed (shortness of breath or wheezing). 07/08/22   Dale [place], MD  metFORMIN (GLUCOPHAGE) 500 MG tablet TAKE 1 TABLET BY MOUTH TWICE DAILY WITH A MEAL 10/11/22   Dale Ciales, MD  methotrexate (RHEUMATREX) 2.5 MG tablet Take 12.5 mg by mouth every Saturday.    [provider]  metoprolol tartrate (LOPRESSOR) 25 MG tablet Take 1 tablet (25 mg total) by mouth 2 (two) times daily. 05/19/22   Iran Ouch, MD  mirtazapine (REMERON) 7.5 MG tablet 1/2 tablet q hs prn 07/08/22   Dale [place], MD  Multiple Vitamins-Minerals (PRESERVISION AREDS 2) CAPS Take 1 capsule by mouth 2 (two) times daily.    [provider]  nitroGLYCERIN (NITROSTAT) 0.4 MG SL tablet Place 1 tablet (0.4 mg total) under the tongue every 5 (five)  minutes as needed for chest pain. 04/06/22   Sunnie Nielsen, DO  ondansetron (ZOFRAN-ODT) 4 MG disintegrating tablet Take 4 mg by mouth 3 (three) times daily as needed for nausea or vomiting.    [provider]  PROAIR HFA 108 [phone] Base) MCG/ACT inhaler Inhale 2 puffs into the lungs every 4 (four) hours as needed for wheezing or shortness of breath. 08/20/20   Rolly Salter, MD  Vitamin D, Ergocalciferol, (DRISDOL) 50000 units CAPS capsule Take 50,000 Units by mouth every Friday.    [provider]    Physical Exam: Vitals:   10/24/22 2144 10/24/22 2145 10/25/22 0119  BP: (!) 158/67  (!) 161/99  Pulse: 82  88  Resp: 20  17  Temp: 99 F (37.2 C)  98.4 F (36.9 C)  TempSrc: Oral  Oral  SpO2: 96%  98%  Weight:  63 kg   Height:  5\' 1"  (1.549 m)    Physical Exam Vitals and nursing note reviewed.  Constitutional:      General: She is not in acute distress.    Comments: Tearful and anxious appearing  HENT:     Head:  Normocephalic and atraumatic.  Cardiovascular:     Rate and Rhythm: Normal rate and regular rhythm.     Heart sounds: Normal heart sounds.  Pulmonary:     Effort: Pulmonary effort is normal.     Breath sounds: Normal breath sounds.  Abdominal:     Palpations: Abdomen is soft.     Tenderness: There is no abdominal tenderness.  Neurological:     General: No focal deficit present.  Psychiatric:        Mood and Affect: Mood is depressed.     Labs on Admission: I have personally reviewed following labs and imaging studies  CBC: Recent Labs  Lab 10/24/22 2251  WBC 11.9*  HGB 12.0  HCT 37.1  MCV 93.7  PLT 313   Basic Metabolic Panel: Recent Labs  Lab 10/24/22 2251  NA 140  K 4.2  CL 104  CO2 22  GLUCOSE 128*  BUN 25*  CREATININE 0.90  CALCIUM 9.4  MG 1.1*   GFR: Estimated Creatinine Clearance: 37.5 mL/min (by C-G formula based on SCr of 0.9 mg/dL). Liver Function Tests: Recent Labs  Lab 10/24/22 2251  AST 29  ALT 20  ALKPHOS 51  BILITOT 1.1  PROT 7.0  ALBUMIN 4.2   No results for input(s): "LIPASE", "AMYLASE" in the last 168 hours. No results for input(s): "AMMONIA" in the last 168 hours. Coagulation Profile: No results for input(s): "INR", "PROTIME" in the last 168 hours. Cardiac Enzymes: No results for input(s): "CKTOTAL", "CKMB", "CKMBINDEX", "TROPONINI" in the last 168 hours. BNP (last 3 results) No results for input(s): "PROBNP" in the last 8760 hours. HbA1C: No results for input(s): "HGBA1C" in the last 72 hours. CBG: No results for input(s): "GLUCAP" in the last 168 hours. Lipid Profile: No results for input(s): "CHOL", "HDL", "LDLCALC", "TRIG", "CHOLHDL", "LDLDIRECT" in the last 72 hours. Thyroid Function Tests: No results for input(s): "TSH", "T4TOTAL", "FREET4", "T3FREE", "THYROIDAB" in the last 72 hours. Anemia Panel: No results for input(s): "VITAMINB12", "FOLATE", "FERRITIN", "TIBC", "IRON", "RETICCTPCT" in the last 72 hours. Urine  analysis:    Component Value Date/Time   COLORURINE YELLOW (A) 10/24/2022 2231   APPEARANCEUR HAZY (A) 10/24/2022 2231   APPEARANCEUR Clear 02/18/2013 2004   LABSPEC 1.016 10/24/2022 2231   LABSPEC 1.024 02/18/2013 2004   PHURINE 5.0 10/24/2022 2231  GLUCOSEU NEGATIVE 10/24/2022 2231   GLUCOSEU NEGATIVE 06/08/2022 1201   HGBUR NEGATIVE 10/24/2022 2231   BILIRUBINUR NEGATIVE 10/24/2022 2231   BILIRUBINUR neg 01/16/2015 1437   BILIRUBINUR Negative 02/18/2013 2004   KETONESUR NEGATIVE 10/24/2022 2231   PROTEINUR 100 (A) 10/24/2022 2231   UROBILINOGEN 0.2 06/08/2022 1201   NITRITE NEGATIVE 10/24/2022 2231   LEUKOCYTESUR MODERATE (A) 10/24/2022 2231   LEUKOCYTESUR Trace 02/18/2013 2004    Radiological Exams on Admission: CT Head Wo Contrast  Result Date: 10/25/2022 CLINICAL DATA:  Mental status change, unknown cause EXAM: CT HEAD WITHOUT CONTRAST TECHNIQUE: Contiguous axial images were obtained from the base of the skull through the vertex without intravenous contrast. RADIATION DOSE REDUCTION: This exam was performed according to the departmental dose-optimization program which includes automated exposure control, adjustment of the mA and/or kV according to patient size and/or use of iterative reconstruction technique. COMPARISON:  04/01/2022 FINDINGS: Brain: Normal anatomic configuration. Parenchymal volume loss is commensurate with the patient's age. Moderate stable periventricular white matter changes are present likely reflecting the sequela of small vessel ischemia. 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 Remote appearing lacunar infarct noted within the right thalamus. No abnormal intra or extra-axial mass lesion or fluid collection. No abnormal mass effect or midline shift. No evidence of acute intracranial hemorrhage or infarct. Ventricular size is normal. Cerebellum unremarkable. Vascular: No asymmetric hyperdense vasculature at  the skull base. 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 Mucosal thickening within the maxillary sinuses is present without air-fluid levels identified. Remaining paranasal sinuses are clear. Orbits are unremarkable. Other: Mastoid air cells and middle ear cavities are clear. IMPRESSION: 1. No acute intracranial abnormality. 2. Stable senescent changes and remote infarcts. 3. Mild paranasal sinus disease. Electronically Signed   By: Helyn Numbers M.D.   On: 10/25/2022 00:02   DG Chest 1 View  Result Date: 10/24/2022 CLINICAL DATA:  Cough EXAM: CHEST  1 VIEW COMPARISON:  10/10/2022 FINDINGS: Stable left basilar scarring. Discoid atelectasis at the right lung base. Lung volumes are small, but are stable since prior examination and are otherwise clear. No pneumothorax or pleural effusion. Cardiac size within normal limits. Pulmonary vascularity is normal. No acute bone abnormality. IMPRESSION: 1. Pulmonary hypoinflation. Electronically Signed   By: Helyn Numbers M.D.   On: 10/24/2022 23:57     Data Reviewed: Relevant notes from primary care and specialist visits, past discharge summaries as available in EHR, including Care Everywhere. Prior diagnostic testing as pertinent to current admission diagnoses Updated medications and problem lists for reconciliation ED course, including vitals, labs, imaging, treatment and response to treatment Triage notes, nursing and pharmacy notes and ED provider's notes Notable results as noted in HPI   Assessment and Plan: Visual hallucinations Acute metabolic encephalopathy Dementia Patient presents with visual hallucinations, crying and confusion above baseline for dementia Etiology possibly multifactorial and related to underlying dementia and psychiatric illness as well as UTI Has history of steroid-induced psychosis but is not currently on steroids for her polymyalgia rheumatica Treat UTI as separately  detailed Continue home psych meds and consider psych consult  Urinary tract infection Rocephin and follow cultures History of VRE as well as E. coli in August and July 2023 respectively  Hypertension Continue home amlodipine metoprolol and furosemide  Prolonged QT interval QT of 565 Avoid QT prolonging drugs Continuous cardiac monitoring  Dementia with behavioral disturbance (HCC) Holding Aricept and mirtazapine due to prolonged QT Patient was previously  on Seroquel which was discontinued Considered psych/Geri psych consult  Polymyalgia rheumatica syndrome (HCC) History of steroid-induced psychosis Patient no longer on steroids Continue methotrexate  Diabetes mellitus (HCC) Sliding scale coverage  CAD (coronary artery disease) No complaints of chest pain.  Continue aspirin, atorvastatin and metoprolol and nitroglycerin sublingual as needed  Atrial fibrillation (HCC) Continue metoprolol Continue amiodarone once verified Not currently on anticoagulation due to history of falls.  COPD (chronic obstructive pulmonary disease) (HCC) Not acutely exacerbated DuoNebs as needed  Chronic diastolic CHF (congestive heart failure) (HCC) Clinically euvolemic Continue furosemide, metoprolol        DVT prophylaxis: Lovenox  Consults: none  Advance Care Planning:   Code Status: DNR   Family Communication: Daughter Rinaldo Cloud at bedside  Disposition Plan: Back to previous home environment  Severity of Illness: The appropriate patient status for this patient is INPATIENT. Inpatient status is judged to be reasonable and necessary in order to provide the required intensity of service to ensure the patient's safety. The patient's presenting symptoms, physical exam findings, and initial radiographic and laboratory data in the context of their chronic comorbidities is felt to place them at high risk for further clinical deterioration. Furthermore, it is not anticipated that the  patient will be medically stable for discharge from the hospital within 2 midnights of admission.   * I certify that at the point of admission it is my clinical judgment that the patient will require inpatient hospital care spanning beyond 2 midnights from the point of admission due to high intensity of service, high risk for further deterioration and high frequency of surveillance required.*  Author: Andris Baumann, MD 10/25/2022 2:43 AM  For on call review [email or web address].

## 2022-10-25 NOTE — Assessment & Plan Note (Addendum)
History of steroid-induced psychosis Patient no longer on steroids Continue methotrexate

## 2022-10-25 NOTE — Assessment & Plan Note (Addendum)
Acute metabolic encephalopathy Dementia Patient presents with visual hallucinations, crying and confusion above baseline for dementia Etiology possibly multifactorial and related to underlying dementia and psychiatric illness as well as UTI Has history of steroid-induced psychosis but is not currently on steroids for her polymyalgia rheumatica Treat UTI as separately detailed Continue home psych meds and consider psych consult

## 2022-10-25 NOTE — Assessment & Plan Note (Signed)
Continue home amlodipine metoprolol and furosemide

## 2022-10-25 NOTE — ED Notes (Signed)
Foley placed due to acute urinary retention, pain and abdominal distention

## 2022-10-25 NOTE — Assessment & Plan Note (Addendum)
Holding Aricept and mirtazapine due to prolonged QT Patient was previously on Seroquel which was discontinued Considered psych/Geri psych consult

## 2022-10-25 NOTE — Assessment & Plan Note (Signed)
No complaints of chest pain.  Continue aspirin, atorvastatin and metoprolol and nitroglycerin sublingual as needed

## 2022-10-25 NOTE — Assessment & Plan Note (Signed)
Continue metoprolol Continue amiodarone once verified Not currently on anticoagulation due to history of falls.

## 2022-10-25 NOTE — ED Notes (Signed)
Pt continues yelling out despite redirection attempts by staff. MD messaged for PRN order

## 2022-10-25 NOTE — Assessment & Plan Note (Signed)
Clinically euvolemic Continue furosemide, metoprolol

## 2022-10-25 NOTE — ED Notes (Signed)
Pt on phone with daughter to attempt to calm pt

## 2022-10-25 NOTE — IPAL (Signed)
  Interdisciplinary Goals of Care Family Meeting   Date carried out: 10/25/2022  Location of the meeting: Phone conference  Member's involved: Physician and Family Member or next of kin, daughter Doren Custard Power of Attorney or acting medical decision maker: Daughter Azell Der  Discussion: We discussed goals of care for [name] .  She advised that patient has a DNR form.  Discussed plan of care  Code status:   Code Status: DNR   Disposition: Continue current acute care  Time spent for the meeting: 30    Andris Baumann, MD  10/25/2022, 1:22 AM

## 2022-10-25 NOTE — ED Notes (Signed)
Pt transported to xray 

## 2022-10-25 NOTE — ED Provider Notes (Addendum)
12:10 AM  Assumed care at shift change.  Patient here with increased altered mental status from baseline, hallucinations.  Has UTI.  Previous urine cultures have grown E. coli and Klebsiella both sensitive to ceftriaxone.  Will give dose of Rocephin here.  CT head unremarkable when reviewed and interpreted by myself and the radiologist.  Will discuss with hospitalist for admission.  EKG also shows prolonged QT interval which is new for patient.  Will place on cardiac monitoring and check magnesium level.  Potassium level today is normal.   Jhonny Calixto, Layla Maw, DO 10/25/22 0012   12:45 AM  Pt's using level has come back at 1.1.  Will give IV replacement.  Likely the cause of her prolonged QT interval.  Patient also complaining now of feeling "swimmy headed".  Will recheck vital signs.  Will give gentle IV hydration.   CRITICAL CARE Performed by: Rochele Raring   Total critical care time: 35 minutes  Critical care time was exclusive of separately billable procedures and treating other patients.  Critical care was necessary to treat or prevent imminent or life-threatening deterioration.  Critical care was time spent personally by me on the following activities: development of treatment plan with patient and/or surrogate as well as nursing, discussions with consultants, evaluation of patient's response to treatment, examination of patient, obtaining history from patient or surrogate, ordering and performing treatments and interventions, ordering and review of laboratory studies, ordering and review of radiographic studies, pulse oximetry and re-evaluation of patient's condition.    Linard Daft, Layla Maw, DO 10/25/22 [phone]

## 2022-10-25 NOTE — Assessment & Plan Note (Addendum)
Patient met sepsis criteria secondary to E. coli UTI. Completed the course of antibiotic.

## 2022-10-25 NOTE — Assessment & Plan Note (Signed)
Not acutely exacerbated DuoNebs as needed 

## 2022-10-25 NOTE — ED Notes (Signed)
Pt's daughter is leaving - pt has been agitated throughout the night redirected by daughter. After daughter left pt continues yelling out and responding to hallucinations. Pt hard to redirect at this time.

## 2022-10-25 NOTE — Hospital Course (Signed)
DM, HTN, diastolic CHF, CAD, polymyalgia rheumatica, paroxysmal atrial fibrillation not on anticoagulation due to high fall risk dementia who was brought to the ED with confusion x 1 to 2 days beyond her baseline associated with visual hallucinations.  Family reports patient has been seeing chickens and crying a lot.  She has had no vomiting or diarrhea, cough or shortness of breath or fever.  Patient has presented similarly and her hallucinations and confusion were attributed on separate occasions to infection, to steroid induced psychosis and most recently during her hospitalization in July 2023, to Seroquel at which time she was switched to Ativan.  Of note, patient recently completed a course of doxycycline for an infected cyst between her breasts.  Her last positive urine culture on record was from July when she grew VRE. ED course and data review: BP 158/67 with otherwise normal vitals.  WBC 11,900.  COVID flu and RSV negative.  Urinalysis with moderate leukocyte esterase and rare bacteria.  CMP unremarkable. EKG, personally viewed and interpreted Showed sinus at 90 with nonspecific ST-T wave changes with prolonged QT of 565 CT head nonacute and chest x-ray showing pulmonary hypoinflation. Patient started on ceftriaxone for possible UTI.  Hospitalist consulted for admission.

## 2022-10-25 NOTE — ED Notes (Signed)
Pt back from xray and had BM. Pt cleaned and linens changed. New brief in place.

## 2022-10-25 NOTE — Progress Notes (Signed)
  PROGRESS NOTE    [place]  MBT:[id number] DOB: 30-Oct-1933 DOA: 10/24/2022 PCP: Dale , MD  [id number]  LOS: 0 days   Brief hospital course:   Assessment & Plan: Tanya Cole is a 88 y.o. female with medical history significant for DM, HTN, diastolic CHF, CAD, polymyalgia rheumatica, paroxysmal atrial fibrillation not on anticoagulation due to high fall risk, dementia who was brought to the ED with confusion x 1 to 2 days beyond her baseline associated with visual hallucinations.    Family reports patient has been seeing chickens and crying a lot.  Patient has presented similarly and her hallucinations and confusion were attributed on separate occasions to infection, to steroid induced psychosis and most recently during her hospitalization in July 2023, to Seroquel at which time she was switched to Ativan.  Of note, patient recently completed a course of doxycycline for an infected cyst between her breasts.     Acute delirium  Baseline dementia with behavior disturbance --not currently on steroids for her polymyalgia rheumatica --Treat UTI  --IM Geodon and IV ativan PRN for agitation --Holding Aricept and mirtazapine due to prolonged QT  Presumed Urinary tract infection --can not tell if pt has urinary symptoms.  Started on ceftriaxone on admission --cont ceftriaxone pending urine cx  Hypertension Continue home amlodipine and metoprolol   Prolonged QT interval QT of 565.  Does have low mag of 1.1. Avoid QT prolonging drugs --replete mag  Polymyalgia rheumatica syndrome (HCC) History of steroid-induced psychosis Patient no longer on steroids Continue methotrexate  Diabetes mellitus (HCC) Sliding scale coverage  CAD (coronary artery disease) No complaints of chest pain.   Continue aspirin, atorvastatin and metoprolol and nitroglycerin sublingual as needed  Atrial fibrillation (HCC) Not currently on anticoagulation due to history of  falls. Continue metoprolol  COPD (chronic obstructive pulmonary disease) (HCC) Not acutely exacerbated  Chronic diastolic CHF (congestive heart failure) (HCC) Clinically euvolemic On home lasix only PRN.   DVT prophylaxis: Lovenox SQ Code Status: DNR  Family Communication:  Level of care: Med-Surg Dispo:   The patient is from: home Anticipated d/c is to: home Anticipated d/c date is: 2-3 days Patient currently is not medically ready to d/c due to: on IV abx for presume UTI   Subjective and Interval History:  Per RN, pt has been having yelling, crying and having hallucinations.  Was able to be calmed down with IM geodon.   Objective: Vitals:   10/25/22 0357 10/25/22 0743 10/25/22 1201 10/25/22 1612  BP:  (!) 165/105 124/65 138/61  Pulse:  80 89 96  Resp:  18 (!) 24 20  Temp: 98 F (36.7 C) 98.6 F (37 C) 98.3 F (36.8 C) 98.3 F (36.8 C)  TempSrc: Rectal Oral Oral   SpO2:  96% 96% 98%  Weight:      Height:        Intake/Output Summary (Last 24 hours) at 10/25/2022 1905 Last data filed at 10/25/2022 0359 Gross per 24 hour  Intake --  Output 0 ml  Net 0 ml   Filed Weights   10/24/22 2145  Weight: 63 kg    Examination:   Constitutional: NAD CV: No cyanosis.   RESP: normal respiratory effort, on RA Extremities: No effusions, edema in BLE SKIN: warm, dry   Data Reviewed: I have personally reviewed labs and imaging studies  No charge note.   Darlin Priestly, MD [facility] If 7PM-7AM, please contact night-coverage 10/25/2022, 7:05 PM

## 2022-10-25 NOTE — Assessment & Plan Note (Signed)
Sliding scale coverage 

## 2022-10-26 DIAGNOSIS — G9341 Metabolic encephalopathy: Secondary | ICD-10-CM | POA: Diagnosis not present

## 2022-10-26 LAB — CBC
HCT: 39.1 % (ref 36.0–46.0)
Hemoglobin: 12.7 g/dL (ref 12.0–15.0)
MCH: 30 pg (ref 26.0–34.0)
MCHC: 32.5 g/dL (ref 30.0–36.0)
MCV: 92.4 fL (ref 80.0–100.0)
Platelets: 307 10*3/uL (ref 150–400)
RBC: 4.23 MIL/uL (ref 3.87–5.11)
RDW: 15.4 % (ref 11.5–15.5)
WBC: 16.8 10*3/uL — ABNORMAL HIGH (ref 4.0–10.5)
nRBC: 0 % (ref 0.0–0.2)

## 2022-10-26 LAB — BASIC METABOLIC PANEL
Anion gap: 13 (ref 5–15)
BUN: 19 mg/dL (ref 8–23)
CO2: 21 mmol/L — ABNORMAL LOW (ref 22–32)
Calcium: 8.6 mg/dL — ABNORMAL LOW (ref 8.9–10.3)
Chloride: 104 mmol/L (ref 98–111)
Creatinine, Ser: 0.69 mg/dL (ref 0.44–1.00)
GFR, Estimated: >60 mL/min (ref >60)
Glucose, Bld: 164 mg/dL — ABNORMAL HIGH (ref 70–99)
Potassium: 3.5 mmol/L (ref 3.5–5.1)
Sodium: 138 mmol/L (ref 135–145)

## 2022-10-26 LAB — MAGNESIUM: Magnesium: 1.9 mg/dL (ref 1.7–2.4)

## 2022-10-26 LAB — CBG MONITORING, ED
Glucose-Capillary: 156 mg/dL — ABNORMAL HIGH (ref 70–99)
Glucose-Capillary: 171 mg/dL — ABNORMAL HIGH (ref 70–99)

## 2022-10-26 LAB — GLUCOSE, CAPILLARY
Glucose-Capillary: 176 mg/dL — ABNORMAL HIGH (ref 70–99)
Glucose-Capillary: 182 mg/dL — ABNORMAL HIGH (ref 70–99)

## 2022-10-26 MED ORDER — METOPROLOL TARTRATE 5 MG/5ML IV SOLN
5.0000 mg | Freq: Once | INTRAVENOUS | Status: AC
  Administered 2022-10-26: 5 mg via INTRAVENOUS
  Filled 2022-10-26: qty 5

## 2022-10-26 MED ORDER — POLYETHYLENE GLYCOL 3350 17 G PO PACK
17.0000 g | PACK | Freq: Every day | ORAL | Status: DC
  Administered 2022-10-27 – 2022-11-14 (×14): 17 g via ORAL
  Filled 2022-10-26 (×18): qty 1

## 2022-10-26 MED ORDER — QUETIAPINE FUMARATE 25 MG PO TABS: 25.0000 mg | ORAL_TABLET | Freq: Every day | ORAL | Status: DC

## 2022-10-26 MED ORDER — LACTULOSE 10 GM/15ML PO SOLN
20.0000 g | Freq: Once | ORAL | Status: AC
  Administered 2022-10-26: 20 g via ORAL
  Filled 2022-10-26: qty 30

## 2022-10-26 MED ORDER — SODIUM CHLORIDE 0.9 % IV SOLN: INTRAVENOUS | Status: AC

## 2022-10-26 NOTE — ED Notes (Signed)
Pt agitated and yelling for her mother, stating she's lying outside in the mud. Pt unable to be reoriented to surroundings. Writer repositioned patient and placed pillow under right hip.

## 2022-10-26 NOTE — Progress Notes (Signed)
Patient is unresponsive so phoned daughter Rinaldo Cloud for admission questions. She is ready for any question at any time

## 2022-10-26 NOTE — Progress Notes (Signed)
Patient arrived to unit via stretcher in stable condition. Patient disoriented x4, moaning and speech unintelligible. Incont BM noted. Patient cleansed and repositioned. Bed alarm activated and floor mats in place.

## 2022-10-26 NOTE — ED Notes (Signed)
Writer messaged Bishop Limbo, NP asking for medication to treat patient's tachycardia. Awaiting response.

## 2022-10-26 NOTE — Progress Notes (Signed)
       CROSS COVER NOTE  NAME: Tanya Cole MRN: [id number] DOB : May 10, 1933 ATTENDING PHYSICIAN: Darlin Priestly, MD    Date of Service   10/26/2022   HPI/Events of Note   Notified by RN that M(r)s Cole is tachycardic HR 139. Sinus tachycardia on the monitor and M(r)s Cole is sleeping. She missed PM metoprolol secondary to mental status and not being able to take PO meds.   Interventions   Assessment/Plan:  EKG 5 mg IV Metoprolol       To reach the provider On-Call:   7AM- 7PM see care teams to locate the attending and reach out to them via [email or web address]. 7PM-7AM contact night-coverage If you still have difficulty reaching the appropriate provider, please page the [facility] (Director on Call) for [facility] on amion for assistance  This document was prepared using Sales executive software and may include unintentional dictation errors.  Bishop Limbo DNP, MBA, FNP-BC Nurse Practitioner [facility] Pager [phone]

## 2022-10-26 NOTE — Progress Notes (Addendum)
  Progress Note   Patient: Tanya Cole CZY:[id number] DOB: [birth year] DOA: 10/24/2022     1 DOS: the patient was seen and examined on 10/26/2022   Brief hospital course:  88 y.o. female with medical history significant for DM, HTN, diastolic CHF, CAD, polymyalgia rheumatica, paroxysmal atrial fibrillation not on anticoagulation due to high fall risk, dementia who was brought to the ED with confusion x 1 to 2 days beyond her baseline associated with visual hallucinations.  Workup thus far has been unremarkable except for abnormal urinalysis.  She was also found to have QTc prolongation hence psychogenic medications are limited.  Assessment and Plan: Acute delirium superimposed on baseline dementia: New onset.  CT head shows no acute intracranial malady.  Labs were significant for abnormal urinalysis.  On that account, patient is being treated with antibiotics.  Patient will be managed with Geodon and Ativan..  E. coli urinary tract infection: 50,000 colonies isolated.  UTI likely to contribute to patient's symptoms.  Antibiotic sensitivity pending for now.  Will tailor antibiotics accordingly.  Hypertension: Patient is on amlodipine and metoprolol.  Hydralazine as needed.  Prolonged QTc interval on: Present on admission.  Electrolyte abnormalities including hypomagnesemia were repleted.  Seroquel , Aricept and mirtazapine QTc prolonging medications were held.  Hypomagnesemia: CORRECTED.  Hypertension: Blood pressure not at goal.  Low-salt diet advised.  Patient will continue with amlodipine and metoprolol as tolerated.  History of polymyalgia rheumatica: Not on steroids due to history of steroid-induced psychosis.  Patient is on methotrexate at baseline.  Atrial fibrillation: Rate remains controlled on current regimen.  Not on anticoagulation due to high risk of falls.  COPD: Not in any acute flare.  Chest x-ray does not suggest any infiltrate.  Constipation: Imaging study suggests  possible constipation.  Trial of stool softeners will be offered.  Chronic HFpEF: No evidence of any acute exacerbation this time.  Will continue to monitor carefully.  Lasix as needed.  Subjective: Patient is seen laying in bed.  Episodes of crying.  Unable to obtain any appropriate history from patient.  Physical Exam: Vitals:   10/26/22 1230 10/26/22 1340 10/26/22 1429 10/26/22 1646  BP: (!) 154/75 (!) 144/104 124/63 108/85  Pulse: 93 92 95 (!) 104  Resp: (!) 30 (!) 26 17 14   Temp:  97.6 F (36.4 C) 98.3 F (36.8 C) 98.6 F (37 C)  TempSrc:  Axillary    SpO2: 94% 96% 95% 91%  Weight:      Height:       Patient was seen laying comfortably in bed.  She is still delirious.  Unable to obtain any appropriate history from patient.  She has episodes of crying.  Head looks atraumatic.  Neck supple.  Physical examination was limited due to uncooperation.  Poor respiratory effort.  Abdomen with guarding.  Patient is moving all extremities suggesting no obvious focal deficit. Data Reviewed:  Results are pending, will review when available.  Disposition: Status is: Inpatient Remains inpatient appropriate because: Patient remains encephalopathic and delirious.  Planned Discharge Destination: Barriers to discharge: Persistent symptoms.  Will optimize treatment and determine safe disposition. DVT prophylaxis with Lovenox.    Time spent: 38 minutes  Author: , MD 10/26/2022 5:27 PM  For on call review [email or web address].

## 2022-10-26 NOTE — ED Notes (Signed)
Pt continuously yelling and throwing her leg off the of bed. Pt is alert at this time. And continuously groaning and crying. See PRN orders.

## 2022-10-26 NOTE — ED Notes (Signed)
Lunch meal tray given at this time.  

## 2022-10-26 NOTE — ED Notes (Signed)
Pt's daughter at bedside at this time, this RN has given daughter and update. Denies any needs.

## 2022-10-26 NOTE — Progress Notes (Signed)
   10/26/22 2234  Assess: MEWS Score  Temp 100.2 F (37.9 C)  BP (!) 151/74  Pulse Rate (!) 114  Resp 18  Level of Consciousness Alert  SpO2 95 %  O2 Device Room Air  Assess: MEWS Score  MEWS Temp 0  MEWS Systolic 0  MEWS Pulse 2  MEWS RR 0  MEWS LOC 0  MEWS Score 2  MEWS Score Color Yellow  Assess: if the MEWS score is Yellow or Red  Were vital signs taken at a resting state? Yes  Does the patient meet 2 or more of the SIRS criteria? Yes  Does the patient have a confirmed or suspected source of infection? Yes  Provider and Rapid Response Notified? Yes  MEWS guidelines implemented *See Row Information* Yes  Treat  MEWS Interventions Administered prn meds/treatments  Pain Scale 0-10  Pain Score 0  Take Vital Signs  Increase Vital Sign Frequency  Yellow: Q 2hr X 2 then Q 4hr X 2, if remains yellow, continue Q 4hrs  Assess: SIRS CRITERIA  SIRS Temperature  0  SIRS Pulse 1  SIRS Respirations  0  SIRS WBC 0  SIRS Score Sum  1   Prn medication given. Will continue to monitor.

## 2022-10-27 ENCOUNTER — Inpatient Hospital Stay: Payer: 59

## 2022-10-27 DIAGNOSIS — R9431 Abnormal electrocardiogram [ECG] [EKG]: Secondary | ICD-10-CM

## 2022-10-27 DIAGNOSIS — G9341 Metabolic encephalopathy: Secondary | ICD-10-CM | POA: Diagnosis not present

## 2022-10-27 DIAGNOSIS — I10 Essential (primary) hypertension: Secondary | ICD-10-CM | POA: Diagnosis not present

## 2022-10-27 DIAGNOSIS — J449 Chronic obstructive pulmonary disease, unspecified: Secondary | ICD-10-CM

## 2022-10-27 DIAGNOSIS — I5032 Chronic diastolic (congestive) heart failure: Secondary | ICD-10-CM

## 2022-10-27 DIAGNOSIS — F03918 Unspecified dementia, unspecified severity, with other behavioral disturbance: Secondary | ICD-10-CM

## 2022-10-27 DIAGNOSIS — R441 Visual hallucinations: Secondary | ICD-10-CM | POA: Diagnosis not present

## 2022-10-27 DIAGNOSIS — N3000 Acute cystitis without hematuria: Secondary | ICD-10-CM

## 2022-10-27 LAB — URINE CULTURE: Culture: 50000 — AB

## 2022-10-27 LAB — BASIC METABOLIC PANEL
Anion gap: 9 (ref 5–15)
BUN: 21 mg/dL (ref 8–23)
CO2: 22 mmol/L (ref 22–32)
Calcium: 8.3 mg/dL — ABNORMAL LOW (ref 8.9–10.3)
Chloride: 109 mmol/L (ref 98–111)
Creatinine, Ser: 0.77 mg/dL (ref 0.44–1.00)
GFR, Estimated: >60 mL/min (ref >60)
Glucose, Bld: 190 mg/dL — ABNORMAL HIGH (ref 70–99)
Potassium: 3.4 mmol/L — ABNORMAL LOW (ref 3.5–5.1)
Sodium: 140 mmol/L (ref 135–145)

## 2022-10-27 LAB — CBC
HCT: 36 % (ref 36.0–46.0)
Hemoglobin: 11.9 g/dL — ABNORMAL LOW (ref 12.0–15.0)
MCH: 30.4 pg (ref 26.0–34.0)
MCHC: 33.1 g/dL (ref 30.0–36.0)
MCV: 92.1 fL (ref 80.0–100.0)
Platelets: 305 10*3/uL (ref 150–400)
RBC: 3.91 MIL/uL (ref 3.87–5.11)
RDW: 15.5 % (ref 11.5–15.5)
WBC: 18.7 10*3/uL — ABNORMAL HIGH (ref 4.0–10.5)
nRBC: 0 % (ref 0.0–0.2)

## 2022-10-27 LAB — PROCALCITONIN: Procalcitonin: 0.2 ng/mL

## 2022-10-27 LAB — GLUCOSE, CAPILLARY
Glucose-Capillary: 192 mg/dL — ABNORMAL HIGH (ref 70–99)
Glucose-Capillary: 159 mg/dL — ABNORMAL HIGH (ref 70–99)
Glucose-Capillary: 154 mg/dL — ABNORMAL HIGH (ref 70–99)
Glucose-Capillary: 183 mg/dL — ABNORMAL HIGH (ref 70–99)
Glucose-Capillary: 179 mg/dL — ABNORMAL HIGH (ref 70–99)

## 2022-10-27 LAB — MAGNESIUM: Magnesium: 1.7 mg/dL (ref 1.7–2.4)

## 2022-10-27 LAB — LACTIC ACID, PLASMA
Lactic Acid, Venous: 1 mmol/L (ref 0.5–1.9)
Lactic Acid, Venous: 1 mmol/L (ref 0.5–1.9)

## 2022-10-27 MED ORDER — FOLIC ACID 1 MG PO TABS
1.0000 mg | ORAL_TABLET | Freq: Every day | ORAL | Status: DC
  Administered 2022-10-27 – 2022-11-14 (×19): 1 mg via ORAL
  Filled 2022-10-27 (×19): qty 1

## 2022-10-27 MED ORDER — MAGNESIUM SULFATE 2 GM/50ML IV SOLN
2.0000 g | Freq: Once | INTRAVENOUS | Status: AC
  Administered 2022-10-27: 2 g via INTRAVENOUS
  Filled 2022-10-27: qty 50

## 2022-10-27 MED ORDER — POTASSIUM CHLORIDE CRYS ER 20 MEQ PO TBCR
40.0000 meq | EXTENDED_RELEASE_TABLET | Freq: Once | ORAL | Status: AC
  Administered 2022-10-27: 40 meq via ORAL
  Filled 2022-10-27: qty 2

## 2022-10-27 MED ORDER — GABAPENTIN 100 MG PO CAPS
100.0000 mg | ORAL_CAPSULE | Freq: Every day | ORAL | Status: DC
  Administered 2022-10-27 – 2022-11-13 (×18): 100 mg via ORAL
  Filled 2022-10-27 (×19): qty 1

## 2022-10-27 NOTE — Evaluation (Signed)
Physical Therapy Evaluation Patient Details Name: Tanya Cole MRN: [id number] DOB: [birth year] Today's Date: 10/27/2022  History of Present Illness  88 y.o. female with medical history significant for DM, HTN, diastolic CHF, CAD, polymyalgia rheumatica, paroxysmal atrial fibrillation not on anticoagulation due to high fall risk, dementia who was brought to the ED with confusion x 1 to 2 days beyond her baseline associated with visual hallucinations.  Workup thus far has been unremarkable except for abnormal urinalysis.  She was also found to have QTc prolongation hence psychogenic medications are limited.  Clinical Impression  Pt received in bed lethargic possibly due to medications. Pt is a poor historian, therefore contacted pt's daughter who is a POA and found pt was independent ambulatory with FWW at household level. Pt lives with her sister Tanya Cole thru Friday and one of the three kids stay with pt over the weekend. They plan to continue with it when pt is ready to return home. Pt has good family support 24/7. Pt needed max to total assist for bed mobility and wanted to return to sleep. PT deferred pt form transfers and ambulation 2/2 to lethargy. PT recommendation TBD based on further assessment of functional mobility. PT will continue in acute care.      Recommendations for follow up therapy are one component of a multi-disciplinary discharge planning process, led by the attending physician.  Recommendations may be updated based on patient status, additional functional criteria and insurance authorization.  Follow Up Recommendations Other (comment) Can patient physically be transported by private vehicle: No    Assistance Recommended at Discharge Intermittent Supervision/Assistance  Patient can return home with the following  Two people to help with walking and/or transfers;Assistance with cooking/housework;Assistance with feeding;Two people to help with bathing/dressing/bathroom;Direct  supervision/assist for medications management;Direct supervision/assist for financial management;Assist for transportation;Help with stairs or ramp for entrance    Equipment Recommendations Other (comment) (TBD)  Recommendations for Other Services       Functional Status Assessment Patient has had a recent decline in their functional status and demonstrates the ability to make significant improvements in function in a reasonable and predictable amount of time.     Precautions / Restrictions Precautions Precautions: Fall Restrictions Weight Bearing Restrictions: No      Mobility  Bed Mobility Overal bed mobility: Needs Assistance Bed Mobility: Rolling, Supine to Sit, Sit to Supine Rolling: Max assist   Supine to sit: Total assist Sit to supine: Total assist        Transfers: deferred                   General transfer comment: deferred    Ambulation/Gait               General Gait Details: deferred  Stairs            Wheelchair Mobility    Modified Rankin (Stroke Patients Only)       Balance Overall balance assessment: Needs assistance Sitting-balance support: Feet supported, Bilateral upper extremity supported Sitting balance-Leahy Scale: Zero Sitting balance - Comments: pt lethargic 2/2 to medication. Postural control: Right lateral lean                                   Pertinent Vitals/Pain      Home Living Family/patient expects to be discharged to:: Private residence Living Arrangements: Other relatives Available Help at Discharge: Available 24 hours/day Type of Home: House  Home Access: Stairs to enter Entrance Stairs-Rails: Can reach both Entrance Stairs-Number of Steps: 3   Home Layout: One level Home Equipment: Standard Walker;BSC/3in1;Cane - single point;Shower seat;Grab bars - tub/shower Additional Comments: Pt is a poor historian. Daughter ( POA) provided the information.    Prior Function Prior Level  of Function : Patient poor historian/Family not available             Mobility Comments: As per Daughter, Pt ambulated at household level using FWW independently. Pt lives with her sister M thru F and on the weeeknd with children ADLs Comments: Pt needs min assist with ADLs and dependent for  IADLS     Hand Dominance        Extremity/Trunk Assessment   Upper Extremity Assessment Upper Extremity Assessment: Defer to OT evaluation    Lower Extremity Assessment Lower Extremity Assessment: Generalized weakness       Communication   Communication: Other (comment) (Lethargic and poor arousal.)  Cognition Arousal/Alertness: Lethargic, Suspect due to medications   Overall Cognitive Status: Impaired/Different from baseline                                 General Comments: lethargic and unarousable. Therefore difficult to assess.        General Comments      Exercises     Assessment/Plan    PT Assessment Patient needs continued PT services  PT Problem List Decreased strength;Decreased activity tolerance;Decreased balance;Decreased mobility;Decreased safety awareness;Obesity       PT Treatment Interventions Gait training;Stair training;Functional mobility training;Therapeutic activities;Therapeutic exercise;Balance training;Neuromuscular re-education;Cognitive remediation;Patient/family education    PT Goals (Current goals can be found in the Care Plan section)  Acute Rehab PT Goals Patient Stated Goal: Unable PT Goal Formulation: With patient/family Time For Goal Achievement: 11/10/22 Potential to Achieve Goals: Fair    Frequency Min 2X/week     Co-evaluation               AM-PAC PT "6 Clicks" Mobility  Outcome Measure Help needed turning from your back to your side while in a flat bed without using bedrails?: A Lot Help needed moving from lying on your back to sitting on the side of a flat bed without using bedrails?: Total Help needed  moving to and from a bed to a chair (including a wheelchair)?: Total Help needed standing up from a chair using your arms (e.g., wheelchair or bedside chair)?: Total Help needed to walk in hospital room?: Total Help needed climbing 3-5 steps with a railing? : Total 6 Click Score: 7    End of Session         PT Visit Diagnosis: Other abnormalities of gait and mobility (R26.89);Muscle weakness (generalized) (M62.81);Difficulty in walking, not elsewhere classified (R26.2)    Time: 4196-2229 PT Time Calculation (min) (ACUTE ONLY): 13 min   Charges:   PT Evaluation $PT Eval Moderate Complexity: 1 Mod         Faren Florence PT DPT 3:15 PM,10/27/22

## 2022-10-27 NOTE — Progress Notes (Signed)
[facility]                                                                               [name], is a 88 y.o. female, DOB - Oct 06, 1933, WIO:[id number] Admit date - 10/24/2022    Outpatient Primary MD for the patient is Dale Driftwood, MD  LOS - 2  days    Brief summary   DM, HTN, diastolic CHF, CAD, polymyalgia rheumatica, paroxysmal atrial fibrillation not on anticoagulation due to high fall risk dementia who was brought to the ED with confusion x 1 to 2 days beyond her baseline associated with visual hallucinations.  Family reports patient has been seeing chickens and crying a lot.  She has had no vomiting or diarrhea, cough or shortness of breath or fever.  Patient has presented similarly and her hallucinations and confusion were attributed on separate occasions to infection, to steroid induced psychosis and most recently during her hospitalization in July 2023, to Seroquel at which time she was switched to Ativan.  Of note, patient recently completed a course of doxycycline for an infected cyst between her breasts.  Her last positive urine culture on record was from July when she grew VRE. ED course and data review: BP 158/67 with otherwise normal vitals.  WBC 11,900.  COVID flu and RSV negative.  Urinalysis with moderate leukocyte esterase and rare bacteria.  CMP unremarkable. EKG, personally viewed and interpreted Showed sinus at 90 with nonspecific ST-T wave changes with prolonged QT of 565 CT head nonacute and chest x-ray showing pulmonary hypoinflation. Patient started on ceftriaxone for possible UTI.  Hospitalist consulted for admission.    Assessment & Plan    Assessment and Plan: Visual hallucinations/delirious/ Acute metabolic encephalopathy in the setting of significant dementia with behavioral disturbances  Patient presents with visual hallucinations, crying and confusion above baseline for dementia Etiology possibly multifactorial and related to  underlying dementia and psychiatric illness as well as UTI Has history of steroid-induced psychosis but is not currently on steroids for her polymyalgia rheumatica Continue with IV antibiotics for urinary tract infections and gentle hydration.  Urinary tract infection Urine culture showing E. coli sensitive to Rocephin continue the same.  Fever: blood cultures ordered, choking episode at home. Getting CXR, SLP evaluation.   Hypertension Blood pressure parameters are optimal, continue with amlodipine and metoprolol.    Prolonged QT interval Recheck EKG in the morning keep potassium greater than 4 and magnesium greater than 2  Dementia with behavioral disturbance (HCC) Holding Aricept and mirtazapine due to prolonged QT Patient was previously on Seroquel which was discontinued Considered psych/Geri psych consult  Polymyalgia rheumatica syndrome (HCC) History of steroid-induced psychosis Continue with methotrexate.  Patient has history of steroid induced psychosis currently not on steroids.  Diabetes mellitus (HCC) Continue with sliding scale insulin.  CAD (coronary artery disease) Resume aspirin, metoprolol and statin  Atrial fibrillation (HCC) Rate controlled with metoprolol, not on anticoagulation due to recurrent falls  COPD (chronic obstructive pulmonary disease) (HCC) No wheezing on exam.  Continue with albuterol nebs as needed  Chronic diastolic CHF (congestive heart failure) (HCC) Clinically euvolemic.  Since she is not eating would hold off on Lasix  at this time    Pt is ill appearing, with multiple co morbidities.will need palliative care consult for goals of care.   Estimated body mass index is 26.24 kg/m as calculated from the following:   Height as of this encounter: 5\' 1"  (1.549 m).   Weight as of this encounter: 63 kg.  Code Status: DNR DVT Prophylaxis:  enoxaparin (LOVENOX) injection 40 mg Start: 10/25/22 1000   Level of Care: Level of care:  Med-Surg Family Communication: none at bedside.   Disposition Plan:     Remains inpatient appropriate:    Procedures:  None.   Consultants:   Palliative care consult.   Antimicrobials:   Anti-infectives (From admission, onward)    Start     Dose/Rate Route Frequency Ordered Stop   10/25/22 0100  cefTRIAXone (ROCEPHIN) 1 g in sodium chloride 0.9 % 100 mL IVPB        1 g 200 mL/hr over 30 Minutes Intravenous Every 24 hours 10/25/22 0054     10/25/22 0015  cefTRIAXone (ROCEPHIN) 1 g in sodium chloride 0.9 % 100 mL IVPB  Status:  Discontinued        1 g 200 mL/hr over 30 Minutes Intravenous  Once 10/25/22 0008 10/25/22 0057        Medications  Scheduled Meds:  amLODipine  2.5 mg Oral Daily   aspirin EC  81 mg Oral Daily   atorvastatin  40 mg Oral Daily   enoxaparin (LOVENOX) injection  40 mg Subcutaneous Q24H   insulin aspart  0-15 Units Subcutaneous TID WC   insulin aspart  0-5 Units Subcutaneous QHS   [START ON 10/29/2022] methotrexate  12.5 mg Oral Q Sat   metoprolol tartrate  25 mg Oral BID   polyethylene glycol  17 g Oral Daily   Continuous Infusions:  sodium chloride 50 mL/hr at 10/27/22 1311   cefTRIAXone (ROCEPHIN)  IV 200 mL/hr at 10/27/22 0838   PRN Meds:.acetaminophen **OR** acetaminophen, albuterol, LORazepam, nitroGLYCERIN, ziprasidone    Subjective:   10/29/22 was seen and examined today.   Objective:   Vitals:   10/27/22 0031 10/27/22 0230 10/27/22 0556 10/27/22 0735  BP: (!) 146/72 (!) 140/72 (!) 140/71 (!) 147/64  Pulse: 88 84 (!) 102 99  Resp: 18 18 20 17   Temp: 99.8 F (37.7 C) 98.2 F (36.8 C) (!) 101.4 F (38.6 C) 98.5 F (36.9 C)  TempSrc: Oral Oral Oral   SpO2: 93% 95% 95% 96%  Weight:      Height:        Intake/Output Summary (Last 24 hours) at 10/27/2022 1315 Last data filed at 10/27/2022 1311 Gross per 24 hour  Intake 1134.17 ml  Output 150 ml  Net 984.17 ml   Filed Weights   10/24/22 2145  Weight: 63 kg      Exam General exam: Ill appearing lady, , crying and moaning.  Respiratory system: air entry fair, no wheezing heard.  Cardiovascular system: S1 & S2 heard, RRR. No JVD,  Gastrointestinal system: Abdomen is nondistended, soft and nontender.  Central nervous system: confused, not following commands.  Extremities: no cyanosis or clubbing.  Skin: No rashes, Psychiatry: unable to assess.    Data Reviewed:  I have personally reviewed following labs and imaging studies   CBC Lab Results  Component Value Date   WBC 18.7 (H) 10/27/2022   RBC 3.91 10/27/2022   HGB 11.9 (L) 10/27/2022   HCT 36.0 10/27/2022   MCV 92.1 10/27/2022   MCH  30.4 10/27/2022   PLT 305 10/27/2022   MCHC 33.1 10/27/2022   RDW 15.5 10/27/2022   LYMPHSABS 2,305 09/02/2022   MONOABS 0.7 06/08/2022   EOSABS 463 09/02/2022   BASOSABS 45 09/02/2022     Last metabolic panel Lab Results  Component Value Date   NA 140 10/27/2022   K 3.4 (L) 10/27/2022   CL 109 10/27/2022   CO2 22 10/27/2022   BUN 21 10/27/2022   CREATININE 0.77 10/27/2022   GLUCOSE 190 (H) 10/27/2022   GFRNONAA >60 10/27/2022   GFRAA >60 07/20/2020   CALCIUM 8.3 (L) 10/27/2022   PHOS 3.8 03/02/2021   PROT 7.0 10/24/2022   ALBUMIN 4.2 10/24/2022   BILITOT 1.1 10/24/2022   ALKPHOS 51 10/24/2022   AST 29 10/24/2022   ALT 20 10/24/2022   ANIONGAP 9 10/27/2022    CBG (last 3)  Recent Labs    10/26/22 2210 10/27/22 0737 10/27/22 1136  GLUCAP 182* 192* 159*      Coagulation Profile: No results for input(s): "INR", "PROTIME" in the last 168 hours.   Radiology Studies: No results found.     Kathlen Mody M.D. [facility] 10/27/2022, 1:15 PM  Available via Epic secure chat 7am-7pm After 7 pm, please refer to night coverage provider listed on amion.

## 2022-10-28 ENCOUNTER — Telehealth: Payer: Self-pay

## 2022-10-28 DIAGNOSIS — N3000 Acute cystitis without hematuria: Secondary | ICD-10-CM | POA: Diagnosis not present

## 2022-10-28 DIAGNOSIS — I10 Essential (primary) hypertension: Secondary | ICD-10-CM | POA: Diagnosis not present

## 2022-10-28 DIAGNOSIS — R410 Disorientation, unspecified: Secondary | ICD-10-CM | POA: Diagnosis not present

## 2022-10-28 DIAGNOSIS — R443 Hallucinations, unspecified: Secondary | ICD-10-CM

## 2022-10-28 DIAGNOSIS — G9341 Metabolic encephalopathy: Secondary | ICD-10-CM | POA: Diagnosis not present

## 2022-10-28 DIAGNOSIS — R441 Visual hallucinations: Secondary | ICD-10-CM | POA: Diagnosis not present

## 2022-10-28 LAB — BASIC METABOLIC PANEL
Anion gap: 10 (ref 5–15)
BUN: 21 mg/dL (ref 8–23)
CO2: 21 mmol/L — ABNORMAL LOW (ref 22–32)
Calcium: 8.1 mg/dL — ABNORMAL LOW (ref 8.9–10.3)
Chloride: 110 mmol/L (ref 98–111)
Creatinine, Ser: 0.73 mg/dL (ref 0.44–1.00)
GFR, Estimated: >60 mL/min (ref >60)
Glucose, Bld: 150 mg/dL — ABNORMAL HIGH (ref 70–99)
Potassium: 3.6 mmol/L (ref 3.5–5.1)
Sodium: 141 mmol/L (ref 135–145)

## 2022-10-28 LAB — CBC
HCT: 34 % — ABNORMAL LOW (ref 36.0–46.0)
Hemoglobin: 11 g/dL — ABNORMAL LOW (ref 12.0–15.0)
MCH: 30.2 pg (ref 26.0–34.0)
MCHC: 32.4 g/dL (ref 30.0–36.0)
MCV: 93.4 fL (ref 80.0–100.0)
Platelets: 274 10*3/uL (ref 150–400)
RBC: 3.64 MIL/uL — ABNORMAL LOW (ref 3.87–5.11)
RDW: 15.7 % — ABNORMAL HIGH (ref 11.5–15.5)
WBC: 19.4 10*3/uL — ABNORMAL HIGH (ref 4.0–10.5)
nRBC: 0 % (ref 0.0–0.2)

## 2022-10-28 LAB — GLUCOSE, CAPILLARY
Glucose-Capillary: 182 mg/dL — ABNORMAL HIGH (ref 70–99)
Glucose-Capillary: 162 mg/dL — ABNORMAL HIGH (ref 70–99)
Glucose-Capillary: 133 mg/dL — ABNORMAL HIGH (ref 70–99)
Glucose-Capillary: 146 mg/dL — ABNORMAL HIGH (ref 70–99)

## 2022-10-28 LAB — MAGNESIUM: Magnesium: 2.5 mg/dL — ABNORMAL HIGH (ref 1.7–2.4)

## 2022-10-28 MED ORDER — LORAZEPAM 0.5 MG PO TABS
0.5000 mg | ORAL_TABLET | Freq: Two times a day (BID) | ORAL | Status: DC
  Administered 2022-10-28 – 2022-11-02 (×11): 0.5 mg via ORAL
  Filled 2022-10-28 (×11): qty 1

## 2022-10-28 MED ORDER — KETOROLAC TROMETHAMINE 15 MG/ML IJ SOLN
15.0000 mg | Freq: Once | INTRAMUSCULAR | Status: AC
  Administered 2022-10-28: 15 mg via INTRAVENOUS
  Filled 2022-10-28: qty 1

## 2022-10-28 MED ORDER — DIVALPROEX SODIUM 125 MG PO CSDR
125.0000 mg | DELAYED_RELEASE_CAPSULE | Freq: Two times a day (BID) | ORAL | Status: DC
  Administered 2022-10-28 – 2022-11-14 (×35): 125 mg via ORAL
  Filled 2022-10-28 (×35): qty 1

## 2022-10-28 MED ORDER — CHLORHEXIDINE GLUCONATE CLOTH 2 % EX PADS
6.0000 | MEDICATED_PAD | Freq: Every day | CUTANEOUS | Status: DC
  Administered 2022-10-28 – 2022-11-10 (×14): 6 via TOPICAL

## 2022-10-28 NOTE — Progress Notes (Signed)
SLP Cancellation Note  Patient Details Name: Tanya Cole MRN: [id number] DOB: [birth year]   Cancelled treatment:       Reason Eval/Treat Not Completed: SLP screened, no needs identified, will sign off  While pt appears at reduced risk of aspiration during this screen, her risk is likely to fluctuate with her mentation. Nursing can downgrade diet as appropriate for pt if needed.   Camber Ninh B. Dreama Saa, M.S., CCC-SLP, CBIS Speech-Language Pathologist Certified Brain Injury Specialist [facility]  [facility] [phone] Ascom [phone] Fax [phone]   Reuel Derby 10/28/2022, 3:52 PM

## 2022-10-28 NOTE — TOC Initial Note (Signed)
Transition of Care [facility]) - Initial/Assessment Note    Patient Details  Name: Tanya Cole MRN: [id number] Date of Birth: 02/12/1933  Transition of Care [facility]) CM/SW Contact:    Colin Broach, LCSW Phone Number: 10/28/2022, 8:54 AM  Clinical Narrative:                 CSW phone pt's dr, Rinaldo Cloud, to complete readmit assessment due to pt having altered mental status.  Rinaldo Cloud states pts PCP is Dr. Lorin Picket.  She gets transported to her appts by her dtr-in-law, Monica. Her medications are filled at [facility] on [address]  Rinaldo Cloud states that pt has no concerns with getting he meds.  CSW is waitng on recs for next level of care.  PT will go back to see pt this morning.  Will continue to follow along.  Expected Discharge Plan:  (waitiing on recs from team) Barriers to Discharge: Continued Medical Work up   Patient Goals and CMS Choice Patient states their goals for this hospitalization and ongoing recovery are:: unknown at this time          Expected Discharge Plan and Services                                              Prior Living Arrangements/Services   Lives with:: Self, Siblings, Relatives (lives @ home, sis lives w/her M-F.  Wkend support from other family)                   Activities of Daily Living Home Assistive Devices/Equipment: Dan Humphreys (specify type) ADL Screening (condition at time of admission) Patient's cognitive ability adequate to safely complete daily activities?: No Is the patient deaf or have difficulty hearing?: No Does the patient have difficulty seeing, even when wearing glasses/contacts?: No Does the patient have difficulty concentrating, remembering, or making decisions?: Yes Patient able to express need for assistance with ADLs?: Yes Does the patient have difficulty dressing or bathing?: Yes Independently performs ADLs?: No Does the patient have difficulty walking or climbing stairs?: Yes Weakness of Legs: Both Weakness of  Arms/Hands: Both  Permission Sought/Granted                  Emotional Assessment Appearance:: Other (Comment Required (unable to assess) Attitude/Demeanor/Rapport: Unable to Assess Affect (typically observed): Unable to Assess   Alcohol / Substance Use: Not Applicable Psych Involvement: No (comment)  Admission diagnosis:  Hallucinations [R44.3] Hypomagnesemia [E83.42] Delirium [R41.0] Visual hallucinations [R44.1] Prolonged Q-T interval on ECG [R94.31] Acute UTI [N39.0] Altered mental status, unspecified altered mental status type [R41.82] Acute metabolic encephalopathy [G93.41] Patient Active Problem List   Diagnosis Date Noted   Prolonged QT interval 10/25/2022   Dementia with behavioral disturbance (HCC) 10/25/2022   Delirium 10/25/2022   Breast lesion 10/19/2022   Nasal congestion 09/03/2022   Sleep difficulties 06/12/2022   Tenderness of left calf 06/08/2022   Abnormal urinalysis 05/30/2022   Community acquired pneumonia 05/29/2022   Discharge planning issues 05/22/2022   Atrial fibrillation (HCC) 05/19/2022   Visual hallucinations 05/06/2022   History of Steroid-induced psychosis, with hallucinations (HCC) 04/03/2022   Acute metabolic encephalopathy 04/02/2022   Paroxysmal atrial fibrillation with RVR (HCC) 04/02/2022   CAD (coronary artery disease) 01/25/2022   Polyarthritis 10/31/2021   COPD (chronic obstructive pulmonary disease) (HCC) 05/21/2021   Hypomagnesemia 05/14/2021   Hypokalemia  Acute exacerbation of chronic obstructive pulmonary disease (COPD) (HCC) 03/01/2021   Chronic diastolic CHF (congestive heart failure) (HCC) 11/29/2020   CAP (community acquired pneumonia) 11/29/2020   Abnormal CXR 10/19/2020   Type 2 diabetes mellitus with cardiac complication (HCC) 10/08/2020   COPD with acute exacerbation (HCC) 08/19/2020   Leukocytosis 08/07/2020   Iron deficiency 04/16/2020   Anemia 04/04/2020   Coronary artery disease of native heart with  stable angina pectoris (HCC) 12/29/2018   Memory change 05/27/2018   Osteoporosis 02/05/2018   Polymyalgia rheumatica syndrome (HCC) 05/16/2016   Urinary tract infection 07/07/2014   Diverticulitis 02/24/2013   Reactive airway disease 09/15/2012   Hypertension 09/14/2012   Hypercholesterolemia 09/14/2012   Diabetes mellitus (HCC) 09/14/2012   PCP:  Dale [place], MD Pharmacy:   [facility] [address] (N), [place] - [address] [place]) [place] [address] Phone: [phone] Fax: [phone]  [facility] [address] [place] [address] Phone: [phone] Fax: [phone]     Social Determinants of Health (SDOH) Social History: SDOH Screenings   Food Insecurity: No Food Insecurity (10/26/2022)  Housing: Low Risk  (10/26/2022)  Transportation Needs: No Transportation Needs (10/26/2022)  Utilities: Not At Risk (10/26/2022)  Alcohol Screen: Low Risk  (05/25/2022)  Depression (PHQ2-9): High Risk (10/18/2022)  Financial Resource Strain: Low Risk  (05/27/2022)  Physical Activity: Insufficiently Active (01/07/2019)  Social Connections: Moderately Isolated (05/27/2022)  Stress: No Stress Concern Present (05/27/2022)  Tobacco Use: Low Risk  (10/24/2022)   SDOH Interventions: Housing Interventions: Other (Comment) (daughter refused)   Readmission Risk Interventions    05/30/2022   10:28 AM 05/08/2022   10:08 AM 05/14/2021    9:26 AM  Readmission Risk Prevention Plan  Transportation Screening Complete Complete Complete  PCP or Specialist Appt within 5-7 Days   Complete  Home Care Screening   Complete  Medication Review (RN CM)   Referral to Pharmacy  Medication Review (RN Care Manager) Complete Complete   PCP or Specialist appointment within 3-5 days of discharge  Complete   HRI or Home Care Consult Complete Complete   SW Recovery Care/Counseling Consult Complete Complete    Palliative Care Screening Not Applicable Not Applicable   Skilled Nursing Facility  Complete

## 2022-10-28 NOTE — Progress Notes (Signed)
[facility]                                                                               [name], is a 88 y.o. female, DOB - 1932/11/24, IRC:[id number] Admit date - 10/24/2022    Outpatient Primary MD for the patient is Dale Advance, MD  LOS - 3  days    Brief summary   DM, HTN, diastolic CHF, CAD, polymyalgia rheumatica, paroxysmal atrial fibrillation not on anticoagulation due to high fall risk dementia who was brought to the ED with confusion x 1 to 2 days beyond her baseline associated with visual hallucinations.  Family reports patient has been seeing chickens and crying a lot.  She has had no vomiting or diarrhea, cough or shortness of breath or fever.  Patient has presented similarly and her hallucinations and confusion were attributed on separate occasions to infection, to steroid induced psychosis and most recently during her hospitalization in July 2023, to Seroquel at which time she was switched to Ativan.  Of note, patient recently completed a course of doxycycline for an infected cyst between her breasts.  Her last positive urine culture on record was from July when she grew VRE. ED course and data review: BP 158/67 with otherwise normal vitals.  WBC 11,900.  COVID flu and RSV negative.  Urinalysis with moderate leukocyte esterase and rare bacteria.  CMP unremarkable. EKG, personally viewed and interpreted Showed sinus at 90 with nonspecific ST-T wave changes with prolonged QT of 565 CT head nonacute and chest x-ray showing pulmonary hypoinflation. Patient started on ceftriaxone for possible UTI.  Hospitalist consulted for admission.    Assessment & Plan    Assessment and Plan: Visual hallucinations/delirious/ Acute metabolic encephalopathy in the setting of significant dementia with behavioral disturbances  Patient presents with visual hallucinations, crying and confusion above baseline for dementia Etiology possibly multifactorial and related to  underlying dementia and psychiatric illness as well as UTI Has history of steroid-induced psychosis but is not currently on steroids for her polymyalgia rheumatica Continue with IV antibiotics for urinary tract infections and gentle hydration.  Urinary tract infection Urine culture showing E. coli sensitive to Rocephin continue the same. No changes in meds.   Fever on 12/28: Unclear etiology. Worsening leukocytosis.  Blood cultures ordered, choking episode at home. CXR does not show any pneumonia OR aspiration pneumonitis.   SLP evaluation ordered and pending.   Hypertension Blood pressure parameters are well controlled.  continue with amlodipine and metoprolol.    Prolonged QT interval Recheck EKG reviewed.   keep potassium greater than 4 and magnesium greater than 2  Dementia with behavioral disturbance (HCC) Holding Aricept and mirtazapine due to prolonged QT Patient was previously on Seroquel which was discontinued psych/Geri psych consulted and recommendations given.  She will probably benefit from memory care unit placement.   Polymyalgia rheumatica syndrome (HCC) History of steroid-induced psychosis Continue with methotrexate.  Patient has history of steroid induced psychosis currently not on steroids.  Diabetes mellitus (HCC) Continue with sliding scale insulin. CBG (last 3)  Recent Labs    10/27/22 2114 10/28/22 0926 10/28/22 1200  GLUCAP 179* 182* 162*    CAD (coronary  artery disease) Resume aspirin, metoprolol and statin  Atrial fibrillation (HCC) Rate controlled with metoprolol, not on anticoagulation due to recurrent falls  COPD (chronic obstructive pulmonary disease) (HCC) No wheezing on exam.  Continue with albuterol nebs as needed  Chronic diastolic CHF (congestive heart failure) (HCC) Clinically euvolemic.  Since she is not eating would hold off on Lasix at this time  Hyperlipidemia:  Lipitor.    Mild normocytic anemia:  Monitor.   Pt is  ill appearing, with multiple co morbidities.will need palliative care consult for goals of care.   Estimated body mass index is 26.24 kg/m as calculated from the following:   Height as of this encounter: 5\' 1"  (1.549 m).   Weight as of this encounter: 63 kg.  Code Status: DNR DVT Prophylaxis:  enoxaparin (LOVENOX) injection 40 mg Start: 10/25/22 1000   Level of Care: Level of care: Med-Surg Family Communication: none at bedside.   Disposition Plan:     Remains inpatient appropriate:  pending completion of Rocephin. . SNF Placement.   Procedures:  None.   Consultants:   Palliative care consult.   Antimicrobials:   Anti-infectives (From admission, onward)    Start     Dose/Rate Route Frequency Ordered Stop   10/25/22 0100  cefTRIAXone (ROCEPHIN) 1 g in sodium chloride 0.9 % 100 mL IVPB        1 g 200 mL/hr over 30 Minutes Intravenous Every 24 hours 10/25/22 0054     10/25/22 0015  cefTRIAXone (ROCEPHIN) 1 g in sodium chloride 0.9 % 100 mL IVPB  Status:  Discontinued        1 g 200 mL/hr over 30 Minutes Intravenous  Once 10/25/22 0008 10/25/22 0057        Medications  Scheduled Meds:  amLODipine  2.5 mg Oral Daily   aspirin EC  81 mg Oral Daily   atorvastatin  40 mg Oral Daily   Chlorhexidine Gluconate Cloth  6 each Topical Daily   divalproex  125 mg Oral Q12H   enoxaparin (LOVENOX) injection  40 mg Subcutaneous Q24H   folic acid  1 mg Oral Daily   gabapentin  100 mg Oral QHS   insulin aspart  0-15 Units Subcutaneous TID WC   insulin aspart  0-5 Units Subcutaneous QHS   LORazepam  0.5 mg Oral BID   [START ON 10/29/2022] methotrexate  12.5 mg Oral Q Sat   metoprolol tartrate  25 mg Oral BID   polyethylene glycol  17 g Oral Daily   Continuous Infusions:  cefTRIAXone (ROCEPHIN)  IV 1 g (10/28/22 0035)   PRN Meds:.acetaminophen **OR** acetaminophen, albuterol, nitroGLYCERIN, ziprasidone    Subjective:   10/30/22 was seen and examined today.  She is more  calm with family members at bedside.  No cough or chest pain. She remains confused.  Objective:   Vitals:   10/27/22 1706 10/27/22 2110 10/28/22 0541 10/28/22 0719  BP: (!) 128/57 (!) 150/77 138/68 (!) 149/73  Pulse: (!) 101 (!) 101 (!) 108 100  Resp: 16 20 18 20   Temp: 98.8 F (37.1 C) 99.7 F (37.6 C) 99.3 F (37.4 C) 98.3 F (36.8 C)  TempSrc:   Oral   SpO2: 93% 95% 93% 96%  Weight:      Height:        Intake/Output Summary (Last 24 hours) at 10/28/2022 1157 Last data filed at 10/28/2022 10/30/2022 Gross per 24 hour  Intake 437.87 ml  Output 600 ml  Net -162.13 ml  Filed Weights   10/24/22 2145  Weight: 63 kg     Exam General exam: elderly woman , not in distress, family members at bedside.  Respiratory system: Clear to auscultation. Respiratory effort normal. Cardiovascular system: S1 & S2 heard, RRR. No JVD,  No pedal edema. Gastrointestinal system: Abdomen is nondistended, soft and nontender.  Central nervous system: Alert and oriented to person only.  Extremities: Symmetric 5 x 5 power. Skin: No rashes,  Psychiatry: Mood & affect appropriate.     Data Reviewed:  I have personally reviewed following labs and imaging studies   CBC Lab Results  Component Value Date   WBC 19.4 (H) 10/28/2022   RBC 3.64 (L) 10/28/2022   HGB 11.0 (L) 10/28/2022   HCT 34.0 (L) 10/28/2022   MCV 93.4 10/28/2022   MCH 30.2 10/28/2022   PLT 274 10/28/2022   MCHC 32.4 10/28/2022   RDW 15.7 (H) 10/28/2022   LYMPHSABS 2,305 09/02/2022   MONOABS 0.7 06/08/2022   EOSABS 463 09/02/2022   BASOSABS 45 09/02/2022     Last metabolic panel Lab Results  Component Value Date   NA 141 10/28/2022   K 3.6 10/28/2022   CL 110 10/28/2022   CO2 21 (L) 10/28/2022   BUN 21 10/28/2022   CREATININE 0.73 10/28/2022   GLUCOSE 150 (H) 10/28/2022   GFRNONAA >60 10/28/2022   GFRAA >60 07/20/2020   CALCIUM 8.1 (L) 10/28/2022   PHOS 3.8 03/02/2021   PROT 7.0 10/24/2022   ALBUMIN 4.2  10/24/2022   BILITOT 1.1 10/24/2022   ALKPHOS 51 10/24/2022   AST 29 10/24/2022   ALT 20 10/24/2022   ANIONGAP 10 10/28/2022    CBG (last 3)  Recent Labs    10/27/22 2013 10/27/22 2114 10/28/22 0926  GLUCAP 183* 179* 182*       Coagulation Profile: No results for input(s): "INR", "PROTIME" in the last 168 hours.   Radiology Studies: DG Chest Port 1 View  Result Date: 10/27/2022 CLINICAL DATA:  Fever, altered mental status. History of CHF, asthma EXAM: PORTABLE CHEST 1 VIEW COMPARISON:  Chest radiograph 10/24/2022 FINDINGS: The cardiomediastinal silhouette is stable. There has been no significant interval change in lung aeration. There is no new or worsening focal airspace disease. There is no overt pulmonary edema. There is no pleural effusion or pneumothorax There is no acute osseous abnormality. IMPRESSION: No significant interval change since 10/24/2022. No new or worsening focal airspace disease. Electronically Signed   By: Lesia Hausen M.D.   On: 10/27/2022 16:19       Kathlen Mody M.D. [facility] 10/28/2022, 11:57 AM  Available via Epic secure chat 7am-7pm After 7 pm, please refer to night coverage provider listed on amion.

## 2022-10-28 NOTE — Telephone Encounter (Signed)
Patient's daughter-in-law, Naarah Borgerding, called to state patient is in hospital and her dementia is worse.  Maxine Glenn states they are looking for a facility for her.

## 2022-10-28 NOTE — Consult Note (Cosign Needed)
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 female patient admitted with medical issues and hallucinations.  HPI:  88 yo female presented with AMS and hallucinations.  She was seeing chickens at home and diagnosed with metabolic encephalopathy on admission.  Medications adjusted to assist with the hallucinations while her medical issues clear. Moderate depression and anxiety on assessment, crying as she wants this provider to come with her and talk to her in her home.  Explained this provider could talk to her in the hospital.  NO suicidal/homicidal ideations or substance use.  She stated her sleep was "pretty good" and appetite is "too good." Symptoms should resolved after resolution of any underlying medical concerns.  Restarted Ativan 0.5 mg BID as this assisted during a similar inpatient admission in July.  Evidently, her hallucinations resolve when her medical issue resolves.  A low dose of Depakote was added to assist with her mood since the antidepressants prolong QTC.  Depakote will also assist any behavior issues associated with her dementia diagnosis noted in the chart.  Past Psychiatric History:  hallucinations with infections  Risk to Self:  none Risk to Others:  none Prior Inpatient Therapy:  none Prior Outpatient Therapy: none   Past Medical History:  Past Medical History:  Diagnosis Date   (HFpEF) heart failure with preserved ejection fraction ([place])    a. TTE 12/19: EF 55-60%, probable HK of the mid apical anterior septal myocardium, Gr1DD, mild AI, mildly dilated LA; b.07/2020 Echo: EF 60-65%, no rwma, Gr2 DD. Nl RV fxn. Mildly dil LA. Mild MR; c. 10/2021 Echo: EF 60-65%, no rwma, mild LVH, nl RV fxn, mod elev PASP, mildly dil LA, mild MR, Ao sclerosis.   Asthma    CAD (coronary artery disease)    a. 09/2018 NSTEMI/PCI: LM min irregs, mLAD 95 (PCI/DES), mLAD-2 60%, LCx mild diff dzs, RCA min irregs; b. 03/2020 MV: EF>65%, no ischemia/scar; c. 07/2020 Cath: LM min irregs, LAD 30p, 22m, 90d, D1/2 min irregs, LCX diff dzs throughout, OM1/2/3 mild dzs, RCA 30p, RPDA/RPAV min irrges. EF 55-65%.   CHF (congestive heart failure) ([place])    Diabetes mellitus ([place])    Hypercholesterolemia    Hypertension    Myocardial infarction ([place])    Osteopenia    Palpitations    a. 04/2020 Zio: Avg HR 75. 429 SVT episodes, longest 19 secs @ 133. Occas PACs (3.2%). Rare PVCs (<1%).   Polymyalgia rheumatica syndrome (HCC)    Reactive airway disease    Severe sepsis [facility])     Past Surgical History:  Procedure Laterality Date   ABDOMINAL HYSTERECTOMY  11/01/1979   prolapse and bleeding, ovaries not removed   BREAST EXCISIONAL BIOPSY Right    CATARACT EXTRACTION Right    CHOLECYSTECTOMY N/A 09/02/2019  Procedure: LAPAROSCOPIC CHOLECYSTECTOMY WITH INTRAOPERATIVE CHOLANGIOGRAM;  Surgeon: Olean Ree, MD;  Location: [facility] ORS;  Service: General;  Laterality: N/A;   CORONARY STENT INTERVENTION N/A 10/08/2018   Procedure: CORONARY STENT INTERVENTION;  Surgeon: Wellington Hampshire, MD;  Location: [place] [facility];  Service: Cardiovascular;  Laterality: N/A;   ENDOSCOPIC RETROGRADE  CHOLANGIOPANCREATOGRAPHY (ERCP) WITH PROPOFOL N/A 08/08/2019   Procedure: ENDOSCOPIC RETROGRADE CHOLANGIOPANCREATOGRAPHY (ERCP) WITH PROPOFOL;  Surgeon: Lucilla Lame, MD;  Location: [facility] ENDOSCOPY;  Service: Endoscopy;  Laterality: N/A;   LEFT HEART CATH AND CORONARY ANGIOGRAPHY N/A 10/08/2018   Procedure: LEFT HEART CATH AND CORONARY ANGIOGRAPHY;  Surgeon: Wellington Hampshire, MD;  Location: [place] [facility];  Service: Cardiovascular;  Laterality: N/A;   LEFT HEART CATH AND CORONARY ANGIOGRAPHY N/A 08/10/2020   Procedure: LEFT HEART CATH AND CORONARY ANGIOGRAPHY possible percutaneous intervention;  Surgeon: Wellington Hampshire, MD;  Location: [place] [facility];  Service: Cardiovascular;  Laterality: N/A;   UMBILICAL HERNIA REPAIR  04/30/1993   Family History:  Family History  Problem Relation Age of Onset   Arthritis Mother    Heart disease Mother    Heart attack Father    Throat cancer Sister    Parkinson's disease Sister    COPD Brother    COPD Brother    Family Psychiatric  History: none Social History:  Social History   Substance and Sexual Activity  Alcohol Use No   Alcohol/week: 0.0 standard drinks of alcohol     Social History   Substance and Sexual Activity  Drug Use No    Social History   Socioeconomic History   Marital status: Widowed    Spouse name: Not on file   Number of children: 3   Years of education: Not on file   Highest education level: Not on file  Occupational History   Not on file  Tobacco Use   Smoking status: Never   Smokeless tobacco: Never  Vaping Use   Vaping Use: Never used  Substance and Sexual Activity   Alcohol use: No    Alcohol/week: 0.0 standard drinks of alcohol   Drug use: No   Sexual activity: Not Currently  Other Topics Concern   Not on file  Social History Narrative   No smoking; no alcohol; in [place]; worked in [employer] fund[employer]. Lives by self in [place]. Does all of her own housework. Dtr does food shopping for her.    Social Determinants of Health   Financial Resource Strain: Low Risk  (05/27/2022)   Overall Financial Resource Strain (CARDIA)    Difficulty of Paying Living Expenses: Not hard at all  Food Insecurity: No Food Insecurity (10/26/2022)   Hunger Vital Sign    Worried About Running Out of Food in the Last Year: Never true    Ran Out of Food in the Last Year: Never true  Transportation Needs: No Transportation Needs (10/26/2022)   PRAPARE - Hydrologist (Medical): No    Lack of Transportation (Non-Medical): No  Physical Activity: Insufficiently Active (01/07/2019)   Exercise Vital Sign    Days of Exercise per Week: 2 days    Minutes of Exercise per Session: 10 min  Stress: No Stress Concern Present (05/27/2022)   [place]    Feeling of Stress : Not at all  Social Connections: Moderately Isolated (05/27/2022)   Social Connection and Isolation Panel [NHANES]    Frequency of Communication with Friends and Family:  More than three times a week    Frequency of Social Gatherings with Friends and Family: More than three times a week    Attends Religious Services: More than 4 times per year    Active Member of Clubs or Organizations: No    Attends Archivist Meetings: Never    Marital Status: Widowed   Additional Social History:    Allergies:   Allergies  Allergen Reactions   Tramadol Itching and Nausea And Vomiting    Labs:  Results for orders placed or performed during the hospital encounter of 10/24/22 (from the past 48 hour(s))  CBG monitoring, ED     Status: Abnormal   Collection Time: 10/26/22 11:51 AM  Result Value Ref Range   Glucose-Capillary 171 (H) 70 - 99 mg/dL    Comment: Glucose reference range applies only to samples taken after fasting for at least 8 hours.  Glucose, capillary     Status: Abnormal   Collection Time: 10/26/22  4:52 PM  Result Value Ref Range    Glucose-Capillary 176 (H) 70 - 99 mg/dL    Comment: Glucose reference range applies only to samples taken after fasting for at least 8 hours.  Glucose, capillary     Status: Abnormal   Collection Time: 10/26/22 10:10 PM  Result Value Ref Range   Glucose-Capillary 182 (H) 70 - 99 mg/dL    Comment: Glucose reference range applies only to samples taken after fasting for at least 8 hours.  Basic metabolic panel     Status: Abnormal   Collection Time: 10/27/22  3:59 AM  Result Value Ref Range   Sodium 140 135 - 145 mmol/L   Potassium 3.4 (L) 3.5 - 5.1 mmol/L   Chloride 109 98 - 111 mmol/L   CO2 22 22 - 32 mmol/L   Glucose, Bld 190 (H) 70 - 99 mg/dL    Comment: Glucose reference range applies only to samples taken after fasting for at least 8 hours.   BUN 21 8 - 23 mg/dL   Creatinine, Ser 0.77 0.44 - 1.00 mg/dL   Calcium 8.3 (L) 8.9 - 10.3 mg/dL   GFR, Estimated >60 >60 mL/min    Comment: (NOTE) Calculated using the CKD-EPI Creatinine Equation (2021)    Anion gap 9 5 - 15    Comment: Performed at [facility], [address] [place], [place] [address]  CBC     Status: Abnormal   Collection Time: 10/27/22  3:59 AM  Result Value Ref Range   WBC 18.7 (H) 4.0 - 10.5 K/uL   RBC 3.91 3.87 - 5.11 MIL/uL   Hemoglobin 11.9 (L) 12.0 - 15.0 g/dL   HCT 36.0 36.0 - 46.0 %   MCV 92.1 80.0 - 100.0 fL   MCH 30.4 26.0 - 34.0 pg   MCHC 33.1 30.0 - 36.0 g/dL   RDW 15.5 11.5 - 15.5 %   Platelets 305 150 - 400 K/uL   nRBC 0.0 0.0 - 0.2 %    Comment: Performed at [facility], [address], [place], [place] [address]  Magnesium     Status: None   Collection Time: 10/27/22  3:59 AM  Result Value Ref Range   Magnesium 1.7 1.7 - 2.4 mg/dL    Comment: Performed at [facility], [address] [place], [place] [address]  Glucose, capillary     Status: Abnormal   Collection Time: 10/27/22  7:37 AM  Result Value Ref Range   Glucose-Capillary 192 (  H) 70 - 99 mg/dL     Comment: Glucose reference range applies only to samples taken after fasting for at least 8 hours.  Glucose, capillary     Status: Abnormal   Collection Time: 10/27/22 11:36 AM  Result Value Ref Range   Glucose-Capillary 159 (H) 70 - 99 mg/dL    Comment: Glucose reference range applies only to samples taken after fasting for at least 8 hours.  Glucose, capillary     Status: Abnormal   Collection Time: 10/27/22  3:57 PM  Result Value Ref Range   Glucose-Capillary 154 (H) 70 - 99 mg/dL    Comment: Glucose reference range applies only to samples taken after fasting for at least 8 hours.  Culture, blood (Routine X 2) w Reflex to ID Panel     Status: None (Preliminary result)   Collection Time: 10/27/22  5:26 PM   Specimen: BLOOD  Result Value Ref Range   Specimen Description BLOOD BLOOD LEFT WRIST    Special Requests      BOTTLES DRAWN AEROBIC AND ANAEROBIC Blood Culture adequate volume   Culture      NO GROWTH < 12 HOURS Performed at [facility], [address], [place], [place] [address]    Report Status PENDING   Lactic acid, plasma     Status: None   Collection Time: 10/27/22  5:26 PM  Result Value Ref Range   Lactic Acid, Venous 1.0 0.5 - 1.9 mmol/L    Comment: Performed at [facility], [address] [place], [place] [address]  Procalcitonin - Baseline     Status: None   Collection Time: 10/27/22  5:26 PM  Result Value Ref Range   Procalcitonin 0.20 ng/mL    Comment:        Interpretation: PCT (Procalcitonin) <= 0.5 ng/mL: Systemic infection (sepsis) is not Cole. Local bacterial infection is possible. (NOTE)       Sepsis PCT Algorithm           Lower Respiratory Tract                                      Infection PCT Algorithm    ----------------------------     ----------------------------         PCT < 0.25 ng/mL                PCT < 0.10 ng/mL          Strongly encourage             Strongly discourage   discontinuation of antibiotics     initiation of antibiotics    ----------------------------     -----------------------------       PCT 0.25 - 0.50 ng/mL            PCT 0.10 - 0.25 ng/mL               OR       >80% decrease in PCT            Discourage initiation of                                            antibiotics      Encourage discontinuation  of antibiotics    ----------------------------     -----------------------------         PCT >= 0.50 ng/mL              PCT 0.26 - 0.50 ng/mL               AND        <80% decrease in PCT             Encourage initiation of                                             antibiotics       Encourage continuation           of antibiotics    ----------------------------     -----------------------------        PCT >= 0.50 ng/mL                  PCT > 0.50 ng/mL               AND         increase in PCT                  Strongly encourage                                      initiation of antibiotics    Strongly encourage escalation           of antibiotics                                     -----------------------------                                           PCT <= 0.25 ng/mL                                                 OR                                        > 80% decrease in PCT                                      Discontinue / Do not initiate                                             antibiotics  Performed at [facility], [address] [place], [place] [address]   Culture, blood (Routine X 2) w Reflex to ID Panel     Status: None (Preliminary result)   Collection Time: 10/27/22  5:37 PM   Specimen: BLOOD  Result Value Ref Range   Specimen Description BLOOD BLOOD RIGHT HAND    Special Requests      BOTTLES DRAWN AEROBIC AND ANAEROBIC Blood Culture adequate volume   Culture      NO GROWTH < 12 HOURS Performed at [facility], [address] [place], [place] [address]    Report Status PENDING   Lactic acid, plasma      Status: None   Collection Time: 10/27/22  6:58 PM  Result Value Ref Range   Lactic Acid, Venous 1.0 0.5 - 1.9 mmol/L    Comment: Performed at [facility], [address] [place], [place] [address]  Glucose, capillary     Status: Abnormal   Collection Time: 10/27/22  8:13 PM  Result Value Ref Range   Glucose-Capillary 183 (H) 70 - 99 mg/dL    Comment: Glucose reference range applies only to samples taken after fasting for at least 8 hours.  Glucose, capillary     Status: Abnormal   Collection Time: 10/27/22  9:14 PM  Result Value Ref Range   Glucose-Capillary 179 (H) 70 - 99 mg/dL    Comment: Glucose reference range applies only to samples taken after fasting for at least 8 hours.  Basic metabolic panel     Status: Abnormal   Collection Time: 10/28/22  4:10 AM  Result Value Ref Range   Sodium 141 135 - 145 mmol/L   Potassium 3.6 3.5 - 5.1 mmol/L   Chloride 110 98 - 111 mmol/L   CO2 21 (L) 22 - 32 mmol/L   Glucose, Bld 150 (H) 70 - 99 mg/dL    Comment: Glucose reference range applies only to samples taken after fasting for at least 8 hours.   BUN 21 8 - 23 mg/dL   Creatinine, Ser 0.73 0.44 - 1.00 mg/dL   Calcium 8.1 (L) 8.9 - 10.3 mg/dL   GFR, Estimated >60 >60 mL/min    Comment: (NOTE) Calculated using the CKD-EPI Creatinine Equation (2021)    Anion gap 10 5 - 15    Comment: Performed at [facility], [address] [place], [place] [address]  CBC     Status: Abnormal   Collection Time: 10/28/22  4:10 AM  Result Value Ref Range   WBC 19.4 (H) 4.0 - 10.5 K/uL   RBC 3.64 (L) 3.87 - 5.11 MIL/uL   Hemoglobin 11.0 (L) 12.0 - 15.0 g/dL   HCT 34.0 (L) 36.0 - 46.0 %   MCV 93.4 80.0 - 100.0 fL   MCH 30.2 26.0 - 34.0 pg   MCHC 32.4 30.0 - 36.0 g/dL   RDW 15.7 (H) 11.5 - 15.5 %   Platelets 274 150 - 400 K/uL   nRBC 0.0 0.0 - 0.2 %    Comment: Performed at [facility], [address], [place], [place] [address]  Magnesium     Status: Abnormal    Collection Time: 10/28/22  4:10 AM  Result Value Ref Range   Magnesium 2.5 (H) 1.7 - 2.4 mg/dL    Comment: Performed at [facility], [address] [place], [place] [address]  Glucose, capillary     Status: Abnormal   Collection Time: 10/28/22  9:26 AM  Result Value Ref Range   Glucose-Capillary 182 (H) 70 - 99 mg/dL    Comment: Glucose reference range applies only to samples taken after fasting for at least 8 hours.    Current Facility-Administered Medications  Medication Dose Route Frequency Provider Last  Rate Last Admin   acetaminophen (TYLENOL) tablet 650 mg  650 mg Oral Q6H PRN Athena Masse, MD   650 mg at 10/28/22 1001   Or   acetaminophen (TYLENOL) suppository 650 mg  650 mg Rectal Q6H PRN Athena Masse, MD       albuterol (PROVENTIL) (2.5 MG/3ML) 0.083% nebulizer solution 2.5 mg  2.5 mg Nebulization Q4H PRN Renda Rolls, RPH       amLODipine (NORVASC) tablet 2.5 mg  2.5 mg Oral Daily Judd Gaudier V, MD   2.5 mg at 10/28/22 [id number]   aspirin EC tablet 81 mg  81 mg Oral Daily Athena Masse, MD   81 mg at 10/28/22 0942   atorvastatin (LIPITOR) tablet 40 mg  40 mg Oral Daily Judd Gaudier V, MD   40 mg at 10/28/22 0943   cefTRIAXone (ROCEPHIN) 1 g in sodium chloride 0.9 % 100 mL IVPB  1 g Intravenous Q24H Judd Gaudier V, MD 200 mL/hr at 10/28/22 0035 1 g at 10/28/22 0035   Chlorhexidine Gluconate Cloth 2 % PADS 6 each  6 each Topical Daily Hosie Poisson, MD   6 each at 10/28/22 1042   divalproex (DEPAKOTE SPRINKLE) capsule 125 mg  125 mg Oral Q12H Patrecia Pour, NP       enoxaparin (LOVENOX) injection 40 mg  40 mg Subcutaneous Q24H Judd Gaudier V, MD   40 mg at [id number]   folic acid (FOLVITE) tablet 1 mg  1 mg Oral Daily Hosie Poisson, MD   1 mg at 10/28/22 0944   gabapentin (NEURONTIN) capsule 100 mg  100 mg Oral QHS Hosie Poisson, MD   100 mg at 10/27/22 2022   insulin aspart (novoLOG) injection 0-15 Units  0-15 Units Subcutaneous TID WC Athena Masse,  MD   3 Units at 10/28/22 0945   insulin aspart (novoLOG) injection 0-5 Units  0-5 Units Subcutaneous QHS Athena Masse, MD       LORazepam (ATIVAN) tablet 0.5 mg  0.5 mg Oral BID Patrecia Pour, NP       [START ON 10/29/2022] methotrexate (RHEUMATREX) tablet 12.5 mg  12.5 mg Oral Q Sat Belue, Alver Sorrow, RPH       metoprolol tartrate (LOPRESSOR) tablet 25 mg  25 mg Oral BID Judd Gaudier V, MD   25 mg at 10/28/22 [id number]   nitroGLYCERIN (NITROSTAT) SL tablet 0.4 mg  0.4 mg Sublingual Q5 min PRN Athena Masse, MD       polyethylene glycol (MIRALAX / GLYCOLAX) packet 17 g  17 g Oral Daily Acheampong, Warnell Bureau, MD   17 g at 10/28/22 0945   ziprasidone (GEODON) injection 20 mg  20 mg Intramuscular Q6H PRN Enzo Bi, MD   20 mg at 10/28/22 0134    Musculoskeletal: Strength & Muscle Tone: within normal limits Gait & Station: normal Patient leans: N/A  Psychiatric Specialty Exam: Physical Exam Vitals and nursing note reviewed.  Constitutional:      Appearance: Normal appearance.  HENT:     Head: Normocephalic.     Nose: Nose normal.  Pulmonary:     Effort: Pulmonary effort is normal.  Musculoskeletal:        General: Normal range of motion.     Cervical back: Normal range of motion.  Neurological:     Mental Status: She is alert.  Psychiatric:        Attention and Perception: Attention normal. She perceives visual hallucinations.  Mood and Affect: Affect is blunt.        Speech: Speech normal.        Behavior: Behavior normal. Behavior is cooperative.        Thought Content: Thought content normal.        Cognition and Memory: Cognition and memory normal.        Judgment: Judgment normal.     Review of Systems  Musculoskeletal:  Positive for myalgias.  Psychiatric/Behavioral:  Positive for hallucinations. The patient is nervous/anxious.   All other systems reviewed and are negative.   Blood pressure (!) 149/73, pulse 100, temperature 98.3 F (36.8 C), resp. rate 20, height  5\' 1"  (1.549 m), weight 63 kg, SpO2 96 %.Body mass index is 26.24 kg/m.  General Appearance: Casual  Eye Contact:  Good  Speech:  Normal Rate  Volume:  Normal  Mood:  Anxious and depressed  Affect:  Congruent  Thought Process:  Coherent and Descriptions of Associations: Intact  Orientation:  Full (Time, Place, and Person)  Thought Content:  Logical  Suicidal Thoughts:  No  Homicidal Thoughts:  No  Memory:  Immediate;   Fair Recent;   Fair Remote;   Fair  Judgement:  Fair  Insight:  Fair  Psychomotor Activity:  Decreased  Concentration:  Concentration: Fair and Attention Span: Fair  Recall:  [employer] of Knowledge:  Good  Language:  Good  Akathisia:  No  Handed:  Right  AIMS (if indicated):     Assets:  Housing Leisure Time Resilience Social Support  ADL's:  Intact  Cognition:  WNL  Sleep:        Physical Exam: Physical Exam Vitals and nursing note reviewed.  Constitutional:      Appearance: Normal appearance.  HENT:     Head: Normocephalic.     Nose: Nose normal.  Pulmonary:     Effort: Pulmonary effort is normal.  Musculoskeletal:        General: Normal range of motion.     Cervical back: Normal range of motion.  Neurological:     Mental Status: She is alert.  Psychiatric:        Attention and Perception: Attention normal. She perceives visual hallucinations.        Mood and Affect: Affect is blunt.        Speech: Speech normal.        Behavior: Behavior normal. Behavior is cooperative.        Thought Content: Thought content normal.        Cognition and Memory: Cognition and memory normal.        Judgment: Judgment normal.    Review of Systems  Musculoskeletal:  Positive for myalgias.  Psychiatric/Behavioral:  Positive for hallucinations. The patient is nervous/anxious.   All other systems reviewed and are negative.  Blood pressure (!) 149/73, pulse 100, temperature 98.3 F (36.8 C), resp. rate 20, height 5\' 1"  (1.549 m), weight 63 kg, SpO2 96 %.  Body mass index is 26.24 kg/m.  Treatment Plan Summary: Delirium with hallucinations. Started Ativan 0.5 mg BID PRN Started Depakote 125 mg BID  Patient Cole has ongoing delirium which presents as fluctuating cognition related to medical issues.   Disposition: Patient does not meet criteria for psychiatric inpatient admission. Supportive therapy provided about ongoing stressors.  Waylan Boga, NP 10/28/2022 11:44 AM

## 2022-10-28 NOTE — Progress Notes (Signed)
PT Cancellation Note  Patient Details Name: Tanya Cole MRN: [id number] DOB: 08/02/1933   Cancelled Treatment:    Reason Eval/Treat Not Completed: Patient declined, no reason specified;Pain limiting ability to participate. PT refused at [phone]; will check back at later date; trial premedication.    Nayra Coury A Sheza Strickland 10/28/2022, 4:15 PM

## 2022-10-28 NOTE — Telephone Encounter (Signed)
S/w Monica - stated pt is going downhill fast. Pt currently in hospital - hallucinations, increasing dementia sx. Hospital is setting pt up with palliative care facility - pt will not be returning home when discharged.  Per [facility] - pt is incoherent. Crying and seeing things constantly. Asks for her mother often, and her sisters. Does not recognize family members.   Monica advised to please continue to keep [place] updated as to status.

## 2022-10-29 DIAGNOSIS — F03918 Unspecified dementia, unspecified severity, with other behavioral disturbance: Secondary | ICD-10-CM | POA: Diagnosis not present

## 2022-10-29 DIAGNOSIS — I5032 Chronic diastolic (congestive) heart failure: Secondary | ICD-10-CM | POA: Diagnosis not present

## 2022-10-29 DIAGNOSIS — I4891 Unspecified atrial fibrillation: Secondary | ICD-10-CM | POA: Diagnosis not present

## 2022-10-29 DIAGNOSIS — G9341 Metabolic encephalopathy: Secondary | ICD-10-CM | POA: Diagnosis not present

## 2022-10-29 LAB — GLUCOSE, CAPILLARY
Glucose-Capillary: 207 mg/dL — ABNORMAL HIGH (ref 70–99)
Glucose-Capillary: 135 mg/dL — ABNORMAL HIGH (ref 70–99)
Glucose-Capillary: 156 mg/dL — ABNORMAL HIGH (ref 70–99)
Glucose-Capillary: 161 mg/dL — ABNORMAL HIGH (ref 70–99)

## 2022-10-29 LAB — MAGNESIUM: Magnesium: 2.2 mg/dL (ref 1.7–2.4)

## 2022-10-29 LAB — BASIC METABOLIC PANEL
Anion gap: 11 (ref 5–15)
BUN: 26 mg/dL — ABNORMAL HIGH (ref 8–23)
CO2: 21 mmol/L — ABNORMAL LOW (ref 22–32)
Calcium: 8.2 mg/dL — ABNORMAL LOW (ref 8.9–10.3)
Chloride: 108 mmol/L (ref 98–111)
Creatinine, Ser: 0.73 mg/dL (ref 0.44–1.00)
GFR, Estimated: >60 mL/min (ref >60)
Glucose, Bld: 209 mg/dL — ABNORMAL HIGH (ref 70–99)
Potassium: 4 mmol/L (ref 3.5–5.1)
Sodium: 140 mmol/L (ref 135–145)

## 2022-10-29 LAB — CBC
HCT: 33.1 % — ABNORMAL LOW (ref 36.0–46.0)
Hemoglobin: 11 g/dL — ABNORMAL LOW (ref 12.0–15.0)
MCH: 30.8 pg (ref 26.0–34.0)
MCHC: 33.2 g/dL (ref 30.0–36.0)
MCV: 92.7 fL (ref 80.0–100.0)
Platelets: 328 10*3/uL (ref 150–400)
RBC: 3.57 MIL/uL — ABNORMAL LOW (ref 3.87–5.11)
RDW: 15.4 % (ref 11.5–15.5)
WBC: 17.6 10*3/uL — ABNORMAL HIGH (ref 4.0–10.5)
nRBC: 0 % (ref 0.0–0.2)

## 2022-10-29 LAB — PROCALCITONIN: Procalcitonin: 0.18 ng/mL

## 2022-10-29 NOTE — Progress Notes (Signed)
[facility]                                                                               [name], is a 88 y.o. female, DOB - Mar 22, 1933, QMV:[id number] Admit date - 10/24/2022    Outpatient Primary MD for the patient is Tanya Bogue, MD  LOS - 4  days    Brief summary   Patient is a 88 year old female with past medical history of diastolic CHF, VRE UTI previously, polymyalgia rheumatica, paroxysmal atrial fibrillation and senile dementia brought into the emergency room on 12/25 for visual hallucinations.  Workup revealed urinary tract infection and patient brought in for further evaluation.   Assessment & Plan    Assessment and Plan: Visual hallucinations/delirious/ Acute metabolic encephalopathy in the setting of significant dementia with behavioral disturbances Patient here with visual hallucinations.  Has history of psychiatric illness as well as underlying dementia.  Also found to have acute UTI.  Continue antibiotics.  Psychiatry following.  Urinary tract infection Urine culture showing E. coli sensitive to Rocephin continue the same. No changes in meds.   Fever on 12/28: Unclear etiology.  White blood cell count slowly improving as is procalcitonin.  Hypertension Blood pressure parameters are well controlled.  continue with amlodipine and metoprolol.    Prolonged QT interval Recheck EKG reviewed.   keep potassium greater than 4 and magnesium greater than 2  Dementia with behavioral disturbance (HCC) Holding Aricept and mirtazapine due to prolonged QT Patient was previously on Seroquel which was discontinued psych/[facility] psych consulted and recommendations given.  She will probably benefit from memory care unit placement.   Polymyalgia rheumatica syndrome (HCC) History of steroid-induced psychosis Continue with methotrexate.  Patient has history of steroid induced psychosis currently not on steroids.  Diabetes mellitus (HCC) Continue with  sliding scale insulin. CBG (last 3)  Recent Labs    10/29/22 0842 10/29/22 1142 10/29/22 1642  GLUCAP 207* 135* 156*     CAD (coronary artery disease) Resume aspirin, metoprolol and statin  Atrial fibrillation (HCC) Rate controlled with metoprolol, not on anticoagulation due to recurrent falls  COPD (chronic obstructive pulmonary disease) (HCC) No wheezing on exam.  Continue with albuterol nebs as needed  Chronic diastolic CHF (congestive heart failure) (HCC) Clinically euvolemic.  Since she is not eating would hold off on Lasix at this time, checking BNP  Hyperlipidemia:  Lipitor.    Mild normocytic anemia:  Monitor.    Estimated body mass index is 26.24 kg/m as calculated from the following:   Height as of this encounter: 5\' 1"  (1.549 m).   Weight as of this encounter: 63 kg.  Code Status: DNR DVT Prophylaxis:  enoxaparin (LOVENOX) injection 40 mg Start: 10/25/22 1000   Level of Care: Level of care: Med-Surg Family Communication: Will call family  Disposition Plan:     Remains inpatient appropriate:   pending completion of Rocephin. -Improvement in mentation - SNF Placement.   Procedures:  None.   Consultants:   Palliative care consult.   Antimicrobials:   Anti-infectives (From admission, onward)    Start     Dose/Rate Route Frequency Ordered Stop   10/25/22 0100  cefTRIAXone (ROCEPHIN)  1 g in sodium chloride 0.9 % 100 mL IVPB        1 g 200 mL/hr over 30 Minutes Intravenous Every 24 hours 10/25/22 0054 10/29/22 0123   10/25/22 0015  cefTRIAXone (ROCEPHIN) 1 g in sodium chloride 0.9 % 100 mL IVPB  Status:  Discontinued        1 g 200 mL/hr over 30 Minutes Intravenous  Once 10/25/22 0008 10/25/22 0057        Medications  Scheduled Meds:  amLODipine  2.5 mg Oral Daily   aspirin EC  81 mg Oral Daily   atorvastatin  40 mg Oral Daily   Chlorhexidine Gluconate Cloth  6 each Topical Daily   divalproex  125 mg Oral Q12H   enoxaparin (LOVENOX)  injection  40 mg Subcutaneous Q24H   folic acid  1 mg Oral Daily   gabapentin  100 mg Oral QHS   insulin aspart  0-15 Units Subcutaneous TID WC   insulin aspart  0-5 Units Subcutaneous QHS   LORazepam  0.5 mg Oral BID   methotrexate  12.5 mg Oral Q Sat   metoprolol tartrate  25 mg Oral BID   polyethylene glycol  17 g Oral Daily   Continuous Infusions:   PRN Meds:.acetaminophen **OR** acetaminophen, albuterol, nitroGLYCERIN, ziprasidone    Subjective:   While confused, currently no acute distress Objective:   Vitals:   10/28/22 1606 10/28/22 2033 10/29/22 0529 10/29/22 0838  BP: (!) 120/54 (!) 145/61 (!) 137/57 (!) 143/65  Pulse: 84 92 85 96  Resp: 17 18 18 17   Temp: 98.2 F (36.8 C) 99.2 F (37.3 C) 99.5 F (37.5 C) 98.3 F (36.8 C)  TempSrc:      SpO2: 97% 96% 94% 95%  Weight:      Height:        Intake/Output Summary (Last 24 hours) at 10/29/2022 1725 Last data filed at 10/29/2022 1650 Gross per 24 hour  Intake 160 ml  Output 750 ml  Net -590 ml    Filed Weights   10/24/22 2145  Weight: 63 kg     Exam General exam: Oriented x 1, no acute distress Respiratory system: Clear to auscultation bilaterally Cardiovascular system: Regular rate and rhythm, S1-S2 Gastrointestinal system: Soft nontender, nondistended, positive bowel sounds Extremities: No clubbing or cyanosis or edema Skin: No skin breaks, tears or lesions Psychiatry: Calm with current minute, no acute psychoses    Data Reviewed:  CBG of 209.  Procalcitonin is 0.18 and white blood cell count of 17.6 Radiology Studies: No results found.     2146 M.D. [facility] 10/29/2022, 5:25 PM  Available via Epic secure chat 7am-7pm After 7 pm, please refer to night coverage provider listed on amion.

## 2022-10-29 NOTE — Telephone Encounter (Signed)
Called and spoke to [facility] regarding Tanya Cole.  Will keep [place] posted and let us know if needs anything.

## 2022-10-30 DIAGNOSIS — F03918 Unspecified dementia, unspecified severity, with other behavioral disturbance: Secondary | ICD-10-CM | POA: Diagnosis not present

## 2022-10-30 DIAGNOSIS — I5032 Chronic diastolic (congestive) heart failure: Secondary | ICD-10-CM | POA: Diagnosis not present

## 2022-10-30 DIAGNOSIS — G9341 Metabolic encephalopathy: Secondary | ICD-10-CM | POA: Diagnosis not present

## 2022-10-30 DIAGNOSIS — I4891 Unspecified atrial fibrillation: Secondary | ICD-10-CM | POA: Diagnosis not present

## 2022-10-30 LAB — CBC
HCT: 33.8 % — ABNORMAL LOW (ref 36.0–46.0)
Hemoglobin: 11.2 g/dL — ABNORMAL LOW (ref 12.0–15.0)
MCH: 30.4 pg (ref 26.0–34.0)
MCHC: 33.1 g/dL (ref 30.0–36.0)
MCV: 91.8 fL (ref 80.0–100.0)
Platelets: 386 10*3/uL (ref 150–400)
RBC: 3.68 MIL/uL — ABNORMAL LOW (ref 3.87–5.11)
RDW: 15.6 % — ABNORMAL HIGH (ref 11.5–15.5)
WBC: 16.4 10*3/uL — ABNORMAL HIGH (ref 4.0–10.5)
nRBC: 0 % (ref 0.0–0.2)

## 2022-10-30 LAB — GLUCOSE, CAPILLARY
Glucose-Capillary: 167 mg/dL — ABNORMAL HIGH (ref 70–99)
Glucose-Capillary: 168 mg/dL — ABNORMAL HIGH (ref 70–99)
Glucose-Capillary: 150 mg/dL — ABNORMAL HIGH (ref 70–99)
Glucose-Capillary: 145 mg/dL — ABNORMAL HIGH (ref 70–99)

## 2022-10-30 LAB — MAGNESIUM: Magnesium: 2.1 mg/dL (ref 1.7–2.4)

## 2022-10-30 LAB — BASIC METABOLIC PANEL
Anion gap: 8 (ref 5–15)
BUN: 27 mg/dL — ABNORMAL HIGH (ref 8–23)
CO2: 27 mmol/L (ref 22–32)
Calcium: 8.9 mg/dL (ref 8.9–10.3)
Chloride: 109 mmol/L (ref 98–111)
Creatinine, Ser: 0.69 mg/dL (ref 0.44–1.00)
GFR, Estimated: >60 mL/min (ref >60)
Glucose, Bld: 154 mg/dL — ABNORMAL HIGH (ref 70–99)
Potassium: 3.8 mmol/L (ref 3.5–5.1)
Sodium: 144 mmol/L (ref 135–145)

## 2022-10-30 LAB — BRAIN NATRIURETIC PEPTIDE: B Natriuretic Peptide: 235.2 pg/mL — ABNORMAL HIGH (ref 0.0–100.0)

## 2022-10-30 MED ORDER — FUROSEMIDE 10 MG/ML IJ SOLN
20.0000 mg | Freq: Once | INTRAMUSCULAR | Status: AC
  Administered 2022-10-30: 20 mg via INTRAVENOUS
  Filled 2022-10-30: qty 2

## 2022-10-30 NOTE — Progress Notes (Signed)
[facility]                                                                               [name], is a 88 y.o. female, DOB - [birth year], PIR:[id number] Admit date - 10/24/2022    Outpatient Primary MD for the patient is Dale Gardner, MD  LOS - 5  days    Brief summary   Patient is a 88 year old female with past medical history of diastolic CHF, VRE UTI previously, polymyalgia rheumatica, paroxysmal atrial fibrillation and senile dementia brought into the emergency room on 12/25 for visual hallucinations.  Workup revealed urinary tract infection and patient brought in for further evaluation.   Assessment & Plan    Assessment and Plan: Visual hallucinations/delirious/ Acute metabolic encephalopathy in the setting of significant dementia with behavioral disturbances Patient here with visual hallucinations.  Has history of psychiatric illness as well as underlying dementia.  Also found to have acute UTI.  Continue antibiotics.  Psychiatry following.  Depakote added.  Urinary tract infection Urine culture showing E. coli sensitive to Rocephin continue the same. No changes in meds.   Fever on 12/28: Unclear etiology.  White blood cell count slowly improving as is procalcitonin.  Hypertension Blood pressure parameters are well controlled.  continue with amlodipine and metoprolol.    Prolonged QT interval Recheck EKG reviewed.   keep potassium greater than 4 and magnesium greater than 2  Dementia with behavioral disturbance (HCC) Holding Aricept and mirtazapine due to prolonged QT Patient was previously on Seroquel which was discontinued psych/[facility] psych consulted.  Depakote added. She will probably benefit from memory care unit placement.   Polymyalgia rheumatica syndrome (HCC) History of steroid-induced psychosis Continue with methotrexate.  Patient has history of steroid induced psychosis currently not on steroids.  Diabetes mellitus  (HCC) Continue with sliding scale insulin. CBG (last 3)  Recent Labs    10/29/22 2126 10/30/22 0917 10/30/22 1215  GLUCAP 161* 167* 168*     CAD (coronary artery disease) Resume aspirin, metoprolol and statin  Atrial fibrillation (HCC) Rate controlled with metoprolol, not on anticoagulation due to recurrent falls  COPD (chronic obstructive pulmonary disease) (HCC) No wheezing on exam.  Continue with albuterol nebs as needed  Chronic diastolic CHF (congestive heart failure) (HCC) Clinically euvolemic.  BNP mildly elevated.  Will give 1 dose of Lasix.  Hyperlipidemia:  Lipitor.    Mild normocytic anemia:  Monitor.    Estimated body mass index is 26.24 kg/m as calculated from the following:   Height as of this encounter: 5\' 1"  (1.549 m).   Weight as of this encounter: 63 kg.  Code Status: DNR DVT Prophylaxis:  enoxaparin (LOVENOX) injection 40 mg Start: 10/25/22 1000   Level of Care: Level of care: Med-Surg Family Communication: Will call family  Disposition Plan:     Remains inpatient appropriate:   pending completion of Rocephin. -Improvement in mentation - SNF Placement.   Procedures:  None.   Consultants:   Palliative care consult.   Antimicrobials:   Anti-infectives (From admission, onward)    Start     Dose/Rate Route Frequency Ordered Stop   10/25/22 0100  cefTRIAXone (ROCEPHIN) 1 g in  sodium chloride 0.9 % 100 mL IVPB        1 g 200 mL/hr over 30 Minutes Intravenous Every 24 hours 10/25/22 0054 10/29/22 0123   10/25/22 0015  cefTRIAXone (ROCEPHIN) 1 g in sodium chloride 0.9 % 100 mL IVPB  Status:  Discontinued        1 g 200 mL/hr over 30 Minutes Intravenous  Once 10/25/22 0008 10/25/22 0057        Medications  Scheduled Meds:  amLODipine  2.5 mg Oral Daily   aspirin EC  81 mg Oral Daily   atorvastatin  40 mg Oral Daily   Chlorhexidine Gluconate Cloth  6 each Topical Daily   divalproex  125 mg Oral Q12H   enoxaparin (LOVENOX)  injection  40 mg Subcutaneous Q24H   folic acid  1 mg Oral Daily   gabapentin  100 mg Oral QHS   insulin aspart  0-15 Units Subcutaneous TID WC   insulin aspart  0-5 Units Subcutaneous QHS   LORazepam  0.5 mg Oral BID   methotrexate  12.5 mg Oral Q Sat   metoprolol tartrate  25 mg Oral BID   polyethylene glycol  17 g Oral Daily   Continuous Infusions:   PRN Meds:.acetaminophen **OR** acetaminophen, albuterol, nitroGLYCERIN, ziprasidone    Subjective:   Somnolent Objective:   Vitals:   10/29/22 2034 10/30/22 0539 10/30/22 0734 10/30/22 1222  BP: (!) 163/63 (!) 157/81 (!) 149/77 (!) 149/66  Pulse: 100 100 98 73  Resp: 17 18 18 17   Temp: 98.2 F (36.8 C)  99 F (37.2 C) 98.5 F (36.9 C)  TempSrc:   Oral   SpO2: 96% 93% 91% 95%  Weight:      Height:        Intake/Output Summary (Last 24 hours) at 10/30/2022 1333 Last data filed at 10/30/2022 0547 Gross per 24 hour  Intake --  Output 800 ml  Net -800 ml    Filed Weights   10/24/22 2145  Weight: 63 kg     Exam General exam: Somnolent Respiratory system: Clear to auscultation bilaterally Cardiovascular system: Regular rate and rhythm, S1-S2 Gastrointestinal system: Soft nontender, nondistended, positive bowel sounds Extremities: No clubbing or cyanosis or edema Skin: No skin breaks, tears or lesions Psychiatry: Nursing reports patient is more calm    Data Reviewed:  White blood cell count down to 16.4.  BNP of 235. Radiology Studies: No results found.     2146 M.D. [facility] 10/30/2022, 1:33 PM  Available via Epic secure chat 7am-7pm After 7 pm, please refer to night coverage provider listed on amion.

## 2022-10-31 DIAGNOSIS — R443 Hallucinations, unspecified: Secondary | ICD-10-CM | POA: Diagnosis not present

## 2022-10-31 DIAGNOSIS — G9341 Metabolic encephalopathy: Secondary | ICD-10-CM

## 2022-10-31 DIAGNOSIS — N3000 Acute cystitis without hematuria: Secondary | ICD-10-CM | POA: Diagnosis not present

## 2022-10-31 DIAGNOSIS — F03918 Unspecified dementia, unspecified severity, with other behavioral disturbance: Secondary | ICD-10-CM | POA: Diagnosis not present

## 2022-10-31 LAB — BASIC METABOLIC PANEL
Anion gap: 12 (ref 5–15)
BUN: 33 mg/dL — ABNORMAL HIGH (ref 8–23)
CO2: 28 mmol/L (ref 22–32)
Calcium: 9.4 mg/dL (ref 8.9–10.3)
Chloride: 102 mmol/L (ref 98–111)
Creatinine, Ser: 0.73 mg/dL (ref 0.44–1.00)
GFR, Estimated: >60 mL/min (ref >60)
Glucose, Bld: 139 mg/dL — ABNORMAL HIGH (ref 70–99)
Potassium: 3.5 mmol/L (ref 3.5–5.1)
Sodium: 142 mmol/L (ref 135–145)

## 2022-10-31 LAB — GLUCOSE, CAPILLARY
Glucose-Capillary: 131 mg/dL — ABNORMAL HIGH (ref 70–99)
Glucose-Capillary: 223 mg/dL — ABNORMAL HIGH (ref 70–99)
Glucose-Capillary: 192 mg/dL — ABNORMAL HIGH (ref 70–99)
Glucose-Capillary: 143 mg/dL — ABNORMAL HIGH (ref 70–99)

## 2022-10-31 LAB — PROCALCITONIN: Procalcitonin: 0.1 ng/mL

## 2022-10-31 MED ORDER — ASPIRIN 81 MG PO CHEW
81.0000 mg | CHEWABLE_TABLET | Freq: Every day | ORAL | Status: DC
  Administered 2022-10-31 – 2022-11-14 (×15): 81 mg via ORAL
  Filled 2022-10-31 (×15): qty 1

## 2022-10-31 MED ORDER — DICLOFENAC SODIUM 1 % EX GEL: 2.0000 g | Freq: Three times a day (TID) | CUTANEOUS | Status: DC | PRN

## 2022-10-31 NOTE — Progress Notes (Signed)
Contacted by staff to come an provide prayer with patient. Met with patient and shared in a brief conversation and provided prayer as requested.

## 2022-10-31 NOTE — Progress Notes (Signed)
[facility]                                                                               [name], is a 89 y.o. female, DOB - [birth year], NLZ:[id number] Admit date - 10/24/2022    Outpatient Primary MD for the patient is Einar Pheasant, MD  LOS - 6  days    Brief summary   Patient is a 89 year old female with past medical history of diastolic CHF, VRE UTI previously, polymyalgia rheumatica, paroxysmal atrial fibrillation and senile dementia brought into the emergency room on 12/25 for visual hallucinations.  Workup revealed urinary tract infection and patient brought in for further evaluation.   Assessment & Plan    Assessment and Plan: Visual hallucinations/delirious/ Acute metabolic encephalopathy in the setting of significant dementia with behavioral disturbances Patient here with visual hallucinations.  Has history of psychiatric illness as well as underlying dementia.  Also found to have acute UTI.  Continue antibiotics.  Psychiatry following.  Depakote added.  Patient looks to be having some improvement and is interacting with me appropriately.  She is still quite somnolent.  Urinary tract infection-resolved Urine culture showing E. coli sensitive to Rocephin continue the same.  Completed antibiotic course.  Fever on 12/28: Unclear etiology.  White blood cell count slowly improving as is procalcitonin.  Hypertension Blood pressure parameters are well controlled.  continue with amlodipine and metoprolol.    Prolonged QT interval Recheck EKG reviewed.   keep potassium greater than 4 and magnesium greater than 2  Dementia with behavioral disturbance (HCC) Holding Aricept and mirtazapine due to prolonged QT Patient was previously on Seroquel which was discontinued psych/[facility] psych consulted.  Depakote added. She will probably benefit from memory care unit placement.   Polymyalgia rheumatica syndrome (HCC) History of steroid-induced  psychosis Continue with methotrexate.  Patient has history of steroid induced psychosis currently not on steroids.  Diabetes mellitus ([place]) Continue with sliding scale insulin. CBG (last 3)  Recent Labs    10/30/22 2133 10/31/22 0804 10/31/22 1110  GLUCAP 145* 131* 223*     CAD (coronary artery disease) Resume aspirin, metoprolol and statin  Atrial fibrillation (HCC) Rate controlled with metoprolol, not on anticoagulation due to recurrent falls  COPD (chronic obstructive pulmonary disease) (HCC) No wheezing on exam.  Continue with albuterol nebs as needed  Chronic diastolic CHF (congestive heart failure) (HCC) Clinically euvolemic.  BNP mildly elevated.  Status post 1 dose of Lasix.  Hyperlipidemia:  Lipitor.    Mild normocytic anemia:  Monitor.    Estimated body mass index is 26.24 kg/m as calculated from the following:   Height as of this encounter: 5\' 1"  (1.549 m).   Weight as of this encounter: 63 kg.  Code Status: DNR DVT Prophylaxis:  enoxaparin (LOVENOX) injection 40 mg Start: 10/25/22 1000   Level of Care: Level of care: Med-Surg Family Communication: Will call family  Disposition Plan:     Remains inpatient appropriate:   -Improvement in mentation - SNF Placement.   Procedures:  None.   Consultants:   Palliative care consult.   Antimicrobials:   Anti-infectives (From admission, onward)    Start  Dose/Rate Route Frequency Ordered Stop   10/25/22 0100  cefTRIAXone (ROCEPHIN) 1 g in sodium chloride 0.9 % 100 mL IVPB        1 g 200 mL/hr over 30 Minutes Intravenous Every 24 hours 10/25/22 0054 10/29/22 0123   10/25/22 0015  cefTRIAXone (ROCEPHIN) 1 g in sodium chloride 0.9 % 100 mL IVPB  Status:  Discontinued        1 g 200 mL/hr over 30 Minutes Intravenous  Once 10/25/22 0008 10/25/22 0057        Medications  Scheduled Meds:  amLODipine  2.5 mg Oral Daily   aspirin  81 mg Oral Daily   atorvastatin  40 mg Oral Daily    Chlorhexidine Gluconate Cloth  6 each Topical Daily   divalproex  125 mg Oral Q12H   enoxaparin (LOVENOX) injection  40 mg Subcutaneous I29N   folic acid  1 mg Oral Daily   gabapentin  100 mg Oral QHS   insulin aspart  0-15 Units Subcutaneous TID WC   insulin aspart  0-5 Units Subcutaneous QHS   LORazepam  0.5 mg Oral BID   methotrexate  12.5 mg Oral Q Sat   metoprolol tartrate  25 mg Oral BID   polyethylene glycol  17 g Oral Daily   Continuous Infusions:   PRN Meds:.acetaminophen **OR** acetaminophen, albuterol, diclofenac Sodium, nitroGLYCERIN, ziprasidone    Subjective:   Patient falls asleep easily, but is able to interact with me.  She says she has no complaints. Objective:   Vitals:   10/30/22 1622 10/30/22 1936 10/31/22 0550 10/31/22 0805  BP: (!) 144/66 (!) 143/69 127/66 134/71  Pulse: 86 98 99 100  Resp: 18 18 18 17   Temp: 98.1 F (36.7 C) 98.6 F (37 C) 98.7 F (37.1 C) 98.8 F (37.1 C)  TempSrc:    Oral  SpO2: 96% 95% 93% 92%  Weight:      Height:        Intake/Output Summary (Last 24 hours) at 10/31/2022 1543 Last data filed at 10/31/2022 0840 Gross per 24 hour  Intake 100 ml  Output 800 ml  Net -700 ml    Filed Weights   10/24/22 2145  Weight: 63 kg     Exam General exam: Easily fatigued Respiratory system: Clear to auscultation bilaterally Cardiovascular system: Regular rate and rhythm, S1-S2 Gastrointestinal system: Soft nontender, nondistended, positive bowel sounds Extremities: No clubbing or cyanosis or edema Skin: No skin breaks, tears or lesions Psychiatry: Nursing reports patient is more calm    Data Reviewed:  Procalcitonin 0.1.  Normal basic metabolic panel. Radiology Studies: No results found.     Annita Brod M.D. [facility] 10/31/2022, 3:43 PM  Available via Epic secure chat 7am-7pm After 7 pm, please refer to night coverage provider listed on amion.

## 2022-11-01 ENCOUNTER — Encounter: Payer: Self-pay | Admitting: Internal Medicine

## 2022-11-01 DIAGNOSIS — Z7189 Other specified counseling: Secondary | ICD-10-CM

## 2022-11-01 DIAGNOSIS — N3000 Acute cystitis without hematuria: Secondary | ICD-10-CM | POA: Diagnosis not present

## 2022-11-01 DIAGNOSIS — F03918 Unspecified dementia, unspecified severity, with other behavioral disturbance: Secondary | ICD-10-CM | POA: Diagnosis not present

## 2022-11-01 DIAGNOSIS — R443 Hallucinations, unspecified: Secondary | ICD-10-CM | POA: Diagnosis not present

## 2022-11-01 DIAGNOSIS — Z515 Encounter for palliative care: Secondary | ICD-10-CM

## 2022-11-01 DIAGNOSIS — G9341 Metabolic encephalopathy: Secondary | ICD-10-CM | POA: Diagnosis not present

## 2022-11-01 LAB — CBC
HCT: 36.3 % (ref 36.0–46.0)
Hemoglobin: 11.8 g/dL — ABNORMAL LOW (ref 12.0–15.0)
MCH: 29.9 pg (ref 26.0–34.0)
MCHC: 32.5 g/dL (ref 30.0–36.0)
MCV: 91.9 fL (ref 80.0–100.0)
Platelets: 450 10*3/uL — ABNORMAL HIGH (ref 150–400)
RBC: 3.95 MIL/uL (ref 3.87–5.11)
RDW: 15.3 % (ref 11.5–15.5)
WBC: 10.9 10*3/uL — ABNORMAL HIGH (ref 4.0–10.5)
nRBC: 0 % (ref 0.0–0.2)

## 2022-11-01 LAB — GLUCOSE, CAPILLARY
Glucose-Capillary: 158 mg/dL — ABNORMAL HIGH (ref 70–99)
Glucose-Capillary: 156 mg/dL — ABNORMAL HIGH (ref 70–99)
Glucose-Capillary: 167 mg/dL — ABNORMAL HIGH (ref 70–99)
Glucose-Capillary: 143 mg/dL — ABNORMAL HIGH (ref 70–99)

## 2022-11-01 LAB — CULTURE, BLOOD (ROUTINE X 2)
Culture: NO GROWTH
Culture: NO GROWTH
Special Requests: ADEQUATE
Special Requests: ADEQUATE

## 2022-11-01 MED ORDER — STERILE WATER FOR INJECTION IJ SOLN
INTRAMUSCULAR | Status: AC
  Filled 2022-11-01: qty 10

## 2022-11-01 NOTE — Progress Notes (Incomplete)
HPI: Tanya Cole is a 89 YO F with a PMH of DM, HTN, diastolic CHF, CAD, polymyalgia rheumatica, Afib (not on AC d/t high fall risk dementia). Tanya Cole presented to the ED 10/24/22 with confusion and hallucinations attributed to history of infection, and steroid induced psychosis. Prior hospitalization in July 2023 Tanya Cole had switched from Seroquel to Ativan. Tanya Cole also had prior VRE positive urine analysis. Tanya Cole completed a doxycycline course for an infected cyst.  10/31/22, Tanya Cole had acute UTI, but was resolved. CC: Tanya Cole presents today for altered mental status, prolonged Qtc. Labs: K: 3.5 Mg: None reported, Glucose: 139, BUN: 33, CrCl: 42.2  BP: 158/72, SPO2 96%. Allergies: tramadol. PTA Meds: Atorvastatin 80 mg daily vs 40 mg daily ordered inpatient? Metformin500 BID, lopressor25 BID, remeron7.5, aricept5. Rx at DC: initiated depakote    AC/Heme:  Dvt ppx: LMWH ID:  E.coli UTI - s/p 5 days rocephin  Neuro/Pain: CV:  HTN - amlo, metop, hydral(prn) Pulm:  Endo:   T2DM - SSI GI/Nutrition: LBM 12/28 Hepatic:  Renal:  Onc:   Assessment & Plan: UTI was resolved with antibiotic course of Rocephin. Glucose and BUN are elevated.  Tanya Cole appears to have improvement and is responding appropriately with current regimen of Ativan and depakote. Hold QT prolonging meds, f/u restart. Monitor EKG. Ativan was scheduled, but f/u needed to change PRN. Continue to monitor mental status changes with current regimen. Continue HTN meds, HLN med, metoprolol for Afib and CAD, methotrexate for polymyalgia rheumatica, sliding scale insulin for DM.

## 2022-11-01 NOTE — Progress Notes (Signed)
[facility]                                                                               [name], is a 89 y.o. female, DOB - Feb 07, 1933, SVX:[id number] Admit date - 10/24/2022    Outpatient Primary MD for the patient is Einar Pheasant, MD  LOS - 7  days    Brief summary   Patient is a 89 year old female with past medical history of diastolic CHF, VRE UTI previously, polymyalgia rheumatica, paroxysmal atrial fibrillation and senile dementia brought into the emergency room on 12/25 for visual hallucinations.  Workup revealed urinary tract infection and patient brought in for further evaluation.   Assessment & Plan    Assessment and Plan: Visual hallucinations/delirious/ Acute metabolic encephalopathy in the setting of significant dementia with behavioral disturbances Patient here with visual hallucinations.  Has history of psychiatric illness as well as underlying dementia.  Also found to have acute UTI.  Continue antibiotics.  Psychiatry following.  Depakote added.  Patient looks to be having some improvement and is interacting with me appropriately.  Patient continues to have underlying dementia, but hallucinations looks to be resolved and she is much more interactive.  Urinary tract infection-resolved Urine culture showing E. coli sensitive to Rocephin continue the same.  Completed antibiotic course.  Fever on 12/28: Unclear etiology.  White blood cell now normalized  Hypertension Blood pressure parameters are well controlled.  continue with amlodipine and metoprolol.    Prolonged QT interval Recheck EKG reviewed.   keep potassium greater than 4 and magnesium greater than 2  Dementia with behavioral disturbance (HCC) Holding Aricept and mirtazapine due to prolonged QT Patient was previously on Seroquel which was discontinued psych/[facility] psych consulted.  Depakote added. She will probably benefit from memory care unit placement.   Polymyalgia  rheumatica syndrome (HCC) History of steroid-induced psychosis Continue with methotrexate.  Patient has history of steroid induced psychosis currently not on steroids.  Diabetes mellitus ([place]) Continue with sliding scale insulin. CBG (last 3)  Recent Labs    10/31/22 2123 11/01/22 0742 11/01/22 1136  GLUCAP 143* 158* 156*     CAD (coronary artery disease) Resume aspirin, metoprolol and statin  Atrial fibrillation (HCC) Rate controlled with metoprolol, not on anticoagulation due to recurrent falls  COPD (chronic obstructive pulmonary disease) (HCC) No wheezing on exam.  Continue with albuterol nebs as needed  Chronic diastolic CHF (congestive heart failure) (HCC) Clinically euvolemic.  BNP mildly elevated.  Status post 1 dose of Lasix.  Hyperlipidemia:  Lipitor.    Mild normocytic anemia:  Monitor.    Estimated body mass index is 26.24 kg/m as calculated from the following:   Height as of this encounter: 5\' 1"  (1.549 m).   Weight as of this encounter: 63 kg.  Code Status: DNR DVT Prophylaxis:  enoxaparin (LOVENOX) injection 40 mg Start: 10/25/22 1000   Level of Care: Level of care: Med-Surg Family Communication: Will call family  Disposition Plan:     Remains inpatient appropriate:   Long-term care placement.  Family feels they cannot take care of her on their own.  Procedures:  None.   Consultants:   Palliative care consult.  Antimicrobials:   Anti-infectives (From admission, onward)    Start     Dose/Rate Route Frequency Ordered Stop   10/25/22 0100  cefTRIAXone (ROCEPHIN) 1 g in sodium chloride 0.9 % 100 mL IVPB        1 g 200 mL/hr over 30 Minutes Intravenous Every 24 hours 10/25/22 0054 10/29/22 0123   10/25/22 0015  cefTRIAXone (ROCEPHIN) 1 g in sodium chloride 0.9 % 100 mL IVPB  Status:  Discontinued        1 g 200 mL/hr over 30 Minutes Intravenous  Once 10/25/22 0008 10/25/22 0057        Medications  Scheduled Meds:  amLODipine  2.5  mg Oral Daily   aspirin  81 mg Oral Daily   atorvastatin  40 mg Oral Daily   Chlorhexidine Gluconate Cloth  6 each Topical Daily   divalproex  125 mg Oral Q12H   enoxaparin (LOVENOX) injection  40 mg Subcutaneous I62V   folic acid  1 mg Oral Daily   gabapentin  100 mg Oral QHS   insulin aspart  0-15 Units Subcutaneous TID WC   insulin aspart  0-5 Units Subcutaneous QHS   LORazepam  0.5 mg Oral BID   methotrexate  12.5 mg Oral Q Sat   metoprolol tartrate  25 mg Oral BID   polyethylene glycol  17 g Oral Daily   Continuous Infusions:   PRN Meds:.acetaminophen **OR** acetaminophen, albuterol, diclofenac Sodium, nitroGLYCERIN, ziprasidone    Subjective:   Patient falls asleep easily, but is able to interact with me.  She says she has no complaints. Objective:   Vitals:   10/31/22 1801 10/31/22 2121 11/01/22 0509 11/01/22 0744  BP: 128/65 (!) 120/59 136/66 (!) 158/72  Pulse: 90 99 85 95  Resp: 16 16 20 19   Temp: 98.3 F (36.8 C) 99 F (37.2 C) 97.8 F (36.6 C) 98.2 F (36.8 C)  TempSrc:  Oral Oral   SpO2: 97% 94% 94% 96%  Weight:      Height:        Intake/Output Summary (Last 24 hours) at 11/01/2022 1407 Last data filed at 11/01/2022 0703 Gross per 24 hour  Intake 60 ml  Output 550 ml  Net -490 ml    Filed Weights   10/24/22 2145  Weight: 63 kg     Exam General exam: Easily fatigued,  Respiratory system: Clear to auscultation bilaterally Cardiovascular system: Regular rate and rhythm, S1-S2 Gastrointestinal system: Soft nontender, nondistended, positive bowel sounds Extremities: No clubbing or cyanosis or edema Skin: No skin breaks, tears or lesions Psychiatry: Nursing reports patient is more calm    Data Reviewed:  Procalcitonin 0.1.  Normal basic metabolic panel. Radiology Studies: No results found.     Annita Brod M.D. [facility] 11/01/2022, 2:07 PM  Available via Epic secure chat 7am-7pm After 7 pm, please refer to night coverage  provider listed on amion.

## 2022-11-01 NOTE — Consult Note (Signed)
Consultation Note Date: 11/01/2022   Patient Name: Tanya Cole  DOB: [birth year]  MRN: [id number]  Age / Sex: 89 y.o., female  PCP: Dale Commerce, MD Referring Physician: Hollice Espy, MD  Reason for Consultation: Establishing goals of care  HPI/Patient Profile: 89 y.o. female  with past medical history of dementia, diastolic heart failure, HTN/HLD, DM, CAD-NSTEMI 2019, polymyalgia rheumatica, paroxysmal A-fib not on anticoag due to high fall risk, admitted on 10/24/2022 with visual hallucinations/delirium/acute metabolic encephalopathy in the setting of significant dementia with behavioral disturbances, UTI resolved E. coli sensitive to Rocephin.   Clinical Assessment and Goals of Care: I have reviewed medical records including EPIC notes, labs and imaging, received report from RN, assessed the patient.  Tanya Cole is sitting in the [place] chair in her room.  She appears acutely/chronically ill.  She has known dementia, and is able to tell tell me her name only.  I believe that she can make her basic needs known.  There is no family at bedside at this time.  Face-to-face meeting with physical therapy related to patient condition, needs.  Call to son/HCPOA, Tanya Cole, to discuss diagnosis prognosis, GOC, EOL wishes, disposition and options.  I introduced Palliative Medicine as specialized medical care for people living with serious illness. It focuses on providing relief from the symptoms and stress of a serious illness. The goal is to improve quality of life for both the patient and the family.  We discussed a brief life review of the patient.  Widow since 1998, she worked in Designer, fashion/clothing.  Tanya Cole has 3 children who provide 24/7.  Memory loss several years, worse over last few months.    We then focused on their current illness.  We talk about Tanya Cole's acute health concerns, her UTI that has been  treated, but was no meaningful improvement in her mental status.  We talk about what is next.  Tanya Cole shares, "We were told that she would not be coming back home".  He states that they are anticipating need for long-term care placement.  He shares that she does have Medicaid.  The natural disease trajectory and expectations at EOL were discussed.  I attempted to elicit values and goals of care important to the patient.   I ask if she would be ok living the rest of her life in a nursing home. Tanya Cole states that the person she was 5 years ago would "have a fit", would not want to live in a nursing home.   Advanced directives, concepts specific to code status, artifical feeding and hydration, and rehospitalization were considered and discussed.  DNR  Hospice and Palliative Care services outpatient were explained and offered.  I ask Tanya Cole if he would be surprised if his mother did not see another Christmas.  Tanya Cole ells me that he would not be surprised.  We talk about hospice type care, what is and is not provided.    Discussed the importance of continued conversation with family and the medical providers regarding overall plan  of care and treatment options, ensuring decisions are within the context of the patient's values and GOCs. Questions and concerns were addressed.  The family was encouraged to call with questions or concerns.  PMT will continue to support holistically.  Conference with attending, bedside nursing staff, transition of care team related to patient condition, needs, goals of care, disposition.   HCPOA HCPOA -son Tanya Cole is named first, daughter Tanya Cole's named second.  3 children, Tanya Cole, and Tanya Cole make choices as a team.    SUMMARY OF RECOMMENDATIONS   At this point continue to treat the treatable but no CPR or intubation Considering short-term rehab and need for long-term care Considering hospice care   Code Status/Advance Care Planning: DNR  Symptom Management:  Per  hospitalist, no additional needs at this time.  Palliative Prophylaxis:  Frequent Pain Assessment and Oral Care  Additional Recommendations (Limitations, Scope, Preferences): At this point continue to treat the treatable but no CPR or intubation  Psycho-social/Spiritual:  Desire for further Chaplaincy support:no Additional Recommendations: Caregiving  Support/Resources and Education on Hospice  Prognosis:  < 6 months, or less would not be surprising based on 3 hospital stays in the last 6 months, chronic illness burden, decreasing functional status.  Discharge Planning: To be determined, anticipating short-term rehab and long-term care      Primary Diagnoses: Present on Admission:  Acute metabolic encephalopathy  Urinary tract infection  Dementia with behavioral disturbance (HCC)  Hypertension  CAD (coronary artery disease)  Chronic diastolic CHF (congestive heart failure) (HCC)  COPD (chronic obstructive pulmonary disease) (HCC)  Atrial fibrillation (HCC)  Hallucinations  Polymyalgia rheumatica syndrome (HCC)  History of Steroid-induced psychosis, with hallucinations (HCC)  Delirium   I have reviewed the medical record, interviewed the patient and family, and examined the patient. The following aspects are pertinent.  Past Medical History:  Diagnosis Date   (HFpEF) heart failure with preserved ejection fraction (HCC)    a. TTE 12/19: EF 55-60%, probable HK of the mid apical anterior septal myocardium, Gr1DD, mild AI, mildly dilated LA; b.07/2020 Echo: EF 60-65%, no rwma, Gr2 DD. Nl RV fxn. Mildly dil LA. Mild MR; c. 10/2021 Echo: EF 60-65%, no rwma, mild LVH, nl RV fxn, mod elev PASP, mildly dil LA, mild MR, Ao sclerosis.   Asthma    CAD (coronary artery disease)    a. 09/2018 NSTEMI/PCI: LM min irregs, mLAD 95 (PCI/DES), mLAD-2 60%, LCx mild diff dzs, RCA min irregs; b. 03/2020 MV: EF>65%, no ischemia/scar; c. 07/2020 Cath: LM min irregs, LAD 30p, 26m, 90d, D1/2 min irregs,  LCX diff dzs throughout, OM1/2/3 mild dzs, RCA 30p, RPDA/RPAV min irrges. EF 55-65%.   CHF (congestive heart failure) (HCC)    Diabetes mellitus (HCC)    Hypercholesterolemia    Hypertension    Myocardial infarction (HCC)    Osteopenia    Palpitations    a. 04/2020 [facility]: Avg HR 75. 429 SVT episodes, longest 19 secs @ 133. Occas PACs (3.2%). Rare PVCs (<1%).   Polymyalgia rheumatica syndrome (HCC)    Reactive airway disease    Severe sepsis (HCC)    Social History   Socioeconomic History   Marital status: Widowed    Spouse name: Not on file   Number of children: 3   Years of education: Not on file   Highest education level: Not on file  Occupational History   Not on file  Tobacco Use   Smoking status: Never   Smokeless tobacco: Never  Vaping Use  Vaping Use: Never used  Substance and Sexual Activity   Alcohol use: No    Alcohol/week: 0.0 standard drinks of alcohol   Drug use: No   Sexual activity: Not Currently  Other Topics Concern   Not on file  Social History Narrative   No smoking; no alcohol; in [place]; worked in Designer, fashion/clothing. Lives by self in [place]. Does all of her own housework. Dtr does food shopping for her.   Social Determinants of Health   Financial Resource Strain: Low Risk  (05/27/2022)   Overall Financial Resource Strain (CARDIA)    Difficulty of Paying Living Expenses: Not hard at all  Food Insecurity: No Food Insecurity (10/26/2022)   Hunger Vital Sign    Worried About Running Out of Food in the Last Year: Never true    Ran Out of Food in the Last Year: Never true  Transportation Needs: No Transportation Needs (10/26/2022)   PRAPARE - Administrator, Civil Service (Medical): No    Lack of Transportation (Non-Medical): No  Physical Activity: Insufficiently Active (01/07/2019)   Exercise Vital Sign    Days of Exercise per Week: 2 days    Minutes of Exercise per Session: 10 min  Stress: No Stress Concern Present (05/27/2022)   [employer] of Occupational Health - Occupational Stress Questionnaire    Feeling of Stress : Not at all  Social Connections: Moderately Isolated (05/27/2022)   Social Connection and Isolation Panel [NHANES]    Frequency of Communication with Friends and Family: More than three times a week    Frequency of Social Gatherings with Friends and Family: More than three times a week    Attends Religious Services: More than 4 times per year    Active Member of [employer] or Organizations: No    Attends Banker Meetings: Never    Marital Status: Widowed   Family History  Problem Relation Age of Onset   Arthritis Mother    Heart disease Mother    Heart attack Father    Throat cancer Sister    Parkinson's disease Sister    COPD Brother    COPD Brother    Scheduled Meds:  amLODipine  2.5 mg Oral Daily   aspirin  81 mg Oral Daily   atorvastatin  40 mg Oral Daily   Chlorhexidine Gluconate Cloth  6 each Topical Daily   divalproex  125 mg Oral Q12H   enoxaparin (LOVENOX) injection  40 mg Subcutaneous Q24H   folic acid  1 mg Oral Daily   gabapentin  100 mg Oral QHS   insulin aspart  0-15 Units Subcutaneous TID WC   insulin aspart  0-5 Units Subcutaneous QHS   LORazepam  0.5 mg Oral BID   methotrexate  12.5 mg Oral Q Sat   metoprolol tartrate  25 mg Oral BID   polyethylene glycol  17 g Oral Daily   Continuous Infusions: PRN Meds:.acetaminophen **OR** acetaminophen, albuterol, diclofenac Sodium, nitroGLYCERIN, ziprasidone Medications Prior to Admission:  Prior to Admission medications   Medication Sig Start Date End Date Taking? Authorizing Provider  acetaminophen (TYLENOL) 500 MG tablet Take 500-1,000 mg by mouth every 6 (six) hours as needed for mild pain or moderate pain.   Yes [provider]  amLODipine (NORVASC) 2.5 MG tablet Take 1 tablet by mouth once daily 10/11/22  Yes Dale Blauvelt, MD  aspirin 81 MG tablet Take 81 mg by mouth daily.   Yes [provider]   atorvastatin (LIPITOR) 80 MG  tablet TAKE 1 TABLET BY MOUTH ONCE DAILY AT  6PM 05/04/21  Yes Loel Dubonnet, NP  cholecalciferol (VITAMIN D3) 25 MCG (1000 UNIT) tablet Take 1,000 Units by mouth daily.   Yes [provider]  donepezil (ARICEPT) 5 MG tablet Take by mouth. 06/22/22 12/19/22 Yes [provider]  folic acid (FOLVITE) 1 MG tablet Take 1 mg by mouth daily.   Yes [provider]  furosemide (LASIX) 40 MG tablet Take 1 tablet (40 mg total) by mouth daily as needed (weight gain 5 lbs over 1-2 days, lower extremity swelling, shortness of breath. If medication does not resolve symptoms in 2 days please call Dr / 911). 06/01/22  Yes Shawna Clamp, MD  gabapentin (NEURONTIN) 100 MG capsule Take 100 mg by mouth at bedtime. 10/21/22  Yes [provider]  ipratropium-albuterol (DUONEB) 0.5-2.5 (3) MG/3ML SOLN Take 3 mLs by nebulization 2 (two) times daily as needed (shortness of breath or wheezing). 07/08/22  Yes Einar Pheasant, MD  metFORMIN (GLUCOPHAGE) 500 MG tablet TAKE 1 TABLET BY MOUTH TWICE DAILY WITH A MEAL 10/11/22  Yes Einar Pheasant, MD  metoprolol tartrate (LOPRESSOR) 25 MG tablet Take 1 tablet (25 mg total) by mouth 2 (two) times daily. 05/19/22  Yes Wellington Hampshire, MD  mirtazapine (REMERON) 7.5 MG tablet 1/2 tablet q hs prn 07/08/22  Yes Einar Pheasant, MD  Multiple Vitamins-Minerals (PRESERVISION AREDS 2) CAPS Take 1 capsule by mouth 2 (two) times daily.   Yes [provider]  Albuterol Sulfate (PROAIR RESPICLICK) 017 (90 Base) MCG/ACT AEPB Inhale into the lungs every 4 (four) hours. PRN    [provider]  doxycycline (VIBRA-TABS) 100 MG tablet Take 1 tablet (100 mg total) by mouth 2 (two) times daily. Patient not taking: Reported on 10/25/2022 10/18/22   Einar Pheasant, MD  methotrexate (RHEUMATREX) 2.5 MG tablet Take 12.5 mg by mouth every Saturday.    [provider]  nitroGLYCERIN (NITROSTAT) 0.4 MG SL tablet Place 1  tablet (0.4 mg total) under the tongue every 5 (five) minutes as needed for chest pain. 04/06/22   Emeterio Reeve, DO  ondansetron (ZOFRAN-ODT) 4 MG disintegrating tablet Take 4 mg by mouth 3 (three) times daily as needed for nausea or vomiting. Patient not taking: Reported on 10/25/2022    [provider]  PROAIR HFA 108 [phone] Base) MCG/ACT inhaler Inhale 2 puffs into the lungs every 4 (four) hours as needed for wheezing or shortness of breath. 08/20/20   Lavina Hamman, MD  Vitamin D, Ergocalciferol, (DRISDOL) 50000 units CAPS capsule Take 50,000 Units by mouth every Friday.    [provider]   Allergies  Allergen Reactions   Tramadol Itching and Nausea And Vomiting   Review of Systems  Unable to perform ROS: Dementia    Physical Exam Vitals and nursing note reviewed.     Vital Signs: BP (!) 158/72 (BP Location: Left Arm)   Pulse 95   Temp 98.2 F (36.8 C)   Resp 19   Ht 5\' 1"  (1.549 m)   Wt 63 kg   SpO2 96%   BMI 26.24 kg/m  Pain Scale: PAINAD   Pain Score: 0-No pain   SpO2: SpO2: 96 % O2 Device:SpO2: 96 % O2 Flow Rate: .   IO: Intake/output summary:  Intake/Output Summary (Last 24 hours) at 11/01/2022 1238 Last data filed at 11/01/2022 0703 Gross per 24 hour  Intake 60 ml  Output 550 ml  Net -490 ml  LBM: Last BM Date : 10/27/22 Baseline Weight: Weight: 63 kg Most recent weight: Weight: 63 kg     Palliative Assessment/Data:     Time In: 0840 Time Out: 0955 Time Total: 75 minutes  Greater than 50%  of this time was spent counseling and coordinating care related to the above assessment and plan.  Signed by: Drue Novel, NP   Please contact Palliative Medicine Team phone at [phone] for questions and concerns.  For individual provider: See Shea Evans

## 2022-11-01 NOTE — Progress Notes (Signed)
Physical Therapy Treatment Patient Details Name: Tanya Cole MRN: [id number] DOB: Jul 03, 1933 Today's Date: 11/01/2022   History of Present Illness 89 y.o. female with medical history significant for DM, HTN, diastolic CHF, CAD, polymyalgia rheumatica, paroxysmal atrial fibrillation not on anticoagulation due to high fall risk, dementia who was brought to the ED with confusion x 1 to 2 days beyond her baseline associated with visual hallucinations.  Workup thus far has been unremarkable except for abnormal urinalysis.  She was also found to have QTc prolongation hence psychogenic medications are limited.    PT Comments     Pt received in bed with Son by her side. Son consented to the PT interventions. Pt remained hallucinating throughout the session possible due to meds. Pt participated in rolling to supine->sit  with max assist of 1 with poor sitting balance.  Pt sit ->stand with  total assist. Bed to chair transfer with total assist and total assist to position the pt.  Pt performed manually resisted heel slides and Ankle DF/PF with max vc for proper technique. With repetition pt began to do them without decreasing VC. Pt will benefit from SNF to return to her PLOF otherwise, pt will need 24/7 asssit, HH aide and HHPT if family decides to take pt home.   Recommendations for follow up therapy are one component of a multi-disciplinary discharge planning process, led by the attending physician.  Recommendations may be updated based on patient status, additional functional criteria and insurance authorization.  Follow Up Recommendations  Skilled nursing-short term rehab (<3 hours/day) Can patient physically be transported by private vehicle: No   Assistance Recommended at Discharge Frequent or constant Supervision/Assistance  Patient can return home with the following A lot of help with walking and/or transfers;A lot of help with bathing/dressing/bathroom;Assistance with  cooking/housework;Assistance with feeding;Direct supervision/assist for medications management;Direct supervision/assist for financial management;Assist for transportation;Help with stairs or ramp for entrance   Equipment Recommendations  None recommended by PT    Recommendations for Other Services       Precautions / Restrictions Precautions Precautions: Fall Restrictions Weight Bearing Restrictions: No     Mobility  Bed Mobility Overal bed mobility: Needs Assistance Bed Mobility: Rolling, Supine to Sit, Sit to Supine Rolling: Max assist   Supine to sit: Max assist     General bed mobility comments: needed Max VC to keep eyes open and step to step VC for proepr sequencing and pt particiaption    Transfers Overall transfer level: Needs assistance Equipment used: None Transfers: Sit to/from Stand, Bed to chair/wheelchair/BSC Sit to Stand: Total assist Stand pivot transfers: Total assist         General transfer comment: VC for direction    Ambulation/Gait               General Gait Details: deviation.   Stairs             Wheelchair Mobility    Modified Rankin (Stroke Patients Only)       Balance Overall balance assessment: Needs assistance Sitting-balance support: Feet supported, Single extremity supported Sitting balance-Leahy Scale: Poor   Postural control: Right lateral lean Standing balance support: No upper extremity supported Standing balance-Leahy Scale: Zero Standing balance comment: needs total assist due to lethargy and disorientation.                            Cognition Arousal/Alertness: Lethargic, Suspect due to medications (imapired congnition) Behavior During Therapy: Restless  Overall Cognitive Status: Impaired/Different from baseline                                 General Comments: Pt intermittently alert but remains confused . Oriented to family and self. ( repsonds beter to May.)         Exercises General Exercises - Lower Extremity Ankle Circles/Pumps: PROM, 10 reps, Seated Heel Slides: Strengthening, Both, 10 reps, Seated (manual resistance.)    General Comments        Pertinent Vitals/Pain      Home Living                          Prior Function            PT Goals (current goals can now be found in the care plan section) Acute Rehab PT Goals Patient Stated Goal: unable PT Goal Formulation: Patient unable to participate in goal setting Time For Goal Achievement: 11/10/22 Potential to Achieve Goals: Fair Progress towards PT goals: Progressing toward goals    Frequency    Min 2X/week      PT Plan Current plan remains appropriate    Co-evaluation              AM-PAC PT "6 Clicks" Mobility   Outcome Measure  Help needed turning from your back to your side while in a flat bed without using bedrails?: A Lot Help needed moving from lying on your back to sitting on the side of a flat bed without using bedrails?: A Lot Help needed moving to and from a bed to a chair (including a wheelchair)?: A Lot Help needed standing up from a chair using your arms (e.g., wheelchair or bedside chair)?: Total Help needed to walk in hospital room?: Total Help needed climbing 3-5 steps with a railing? : Total 6 Click Score: 9    End of Session Equipment Utilized During Treatment: Gait belt       PT Visit Diagnosis: Other abnormalities of gait and mobility (R26.89);Muscle weakness (generalized) (M62.81);Difficulty in walking, not elsewhere classified (R26.2)     Time: 6144-3154 PT Time Calculation (min) (ACUTE ONLY): 19 min  Charges:  $Therapeutic Activity: 8-22 mins                     Joaquin Music PT DPT 10:47 AM,11/01/22

## 2022-11-02 DIAGNOSIS — N3000 Acute cystitis without hematuria: Secondary | ICD-10-CM | POA: Diagnosis not present

## 2022-11-02 DIAGNOSIS — Z7189 Other specified counseling: Secondary | ICD-10-CM | POA: Diagnosis not present

## 2022-11-02 DIAGNOSIS — G9341 Metabolic encephalopathy: Secondary | ICD-10-CM | POA: Diagnosis not present

## 2022-11-02 DIAGNOSIS — Z515 Encounter for palliative care: Secondary | ICD-10-CM | POA: Diagnosis not present

## 2022-11-02 DIAGNOSIS — R443 Hallucinations, unspecified: Secondary | ICD-10-CM | POA: Diagnosis not present

## 2022-11-02 DIAGNOSIS — F03918 Unspecified dementia, unspecified severity, with other behavioral disturbance: Secondary | ICD-10-CM | POA: Diagnosis not present

## 2022-11-02 LAB — GLUCOSE, CAPILLARY
Glucose-Capillary: 175 mg/dL — ABNORMAL HIGH (ref 70–99)
Glucose-Capillary: 193 mg/dL — ABNORMAL HIGH (ref 70–99)
Glucose-Capillary: 124 mg/dL — ABNORMAL HIGH (ref 70–99)
Glucose-Capillary: 130 mg/dL — ABNORMAL HIGH (ref 70–99)

## 2022-11-02 MED ORDER — LORAZEPAM 0.5 MG PO TABS
0.2500 mg | ORAL_TABLET | Freq: Two times a day (BID) | ORAL | Status: DC
  Administered 2022-11-02 – 2022-11-03 (×2): 0.25 mg via ORAL
  Filled 2022-11-02 (×2): qty 1

## 2022-11-02 NOTE — Progress Notes (Signed)
Palliative: Mrs. Standlee is sitting up in the [place] chair in her room.  Her eyes are closed and she is resting comfortably.  She appears chronically ill and frail.  She will wake when I call her name, but takes much prompting to open her eyes.  I do not believe that she can make her basic needs known.  There is no family at bedside at this time.  Per mobility notes, Mrs. Diehl was slide transfer to chair with total assist x 2.  I offer Mrs. Leaks something to eat and drink.  She is able to hold her cup and sip without overt signs and symptoms of aspiration.  She does not attempt to put her cup back on the tray, instead holding it in her hand.  I offer her pudding.  She is able to eat the pudding without overt signs and symptoms of aspiration.  She shares that she thinks it tastes good.  Bedside nursing staff arrives.  We talk about feeding Mrs. Augustus.  Staff states that she was fed by someone else this morning.  I encouraged staff to continue to offer pudding and treats.  Call to daughter, Ellwood Handler.  Pam shares a story of decline over the last few months/weeks in particular.  She shares that her mother has been seeing her dead parents recently, but is not fearful.  She also saw her dead brother lying on the bed but expressed peace about that.  Pam shares that she has told her family that she is "ready to go to heaven".  Pam shares that all siblings have shared with her that it is okay for her to go when she is ready.  We talk about the concept of "let nature take its course".  I given example of this.  I shared that it is not a legal, nor unethical, but we realize some people have a moral problem with not doing everything.  I know that just like I will get sick again, so we will Mrs. Devera.  We talk about what this time looks like and feels like for her.  Pam shares her mother's faith.  We talked about outpatient palliative/hospice care for further support.  Pam is agreeable to outpatient palliative care to  start, transition to hospice when appropriate.  Provider choice offered.  She [facility] for outpatient palliative care.  We talk about disposition.  Pam states repeatedly that she and her siblings feel that they can no longer care for Mrs. Peppers at home.  PT recommendations for short-term rehab versus long-term care.  Pam states that she and her siblings feel that Mrs. Brooker would be best served in long-term care.  She tells me that her mother has Medicaid.  Pam shares that [facility] in [place] is close to them and also [employer].  I share that transition of care team are experts and will work with Jeannene Patella for disposition.  Conference with attending, bedside nursing staff, transition of care team related to patient condition, needs, goals of care, disposition.  Plan: Continue to treat the treatable but no CPR or intubation.  Time for outcomes. DNR/goldenrod form on chart.  47 minutes Quinn Axe, NP Palliative medicine team Team phone [phone] Greater than 50% of this time was spent counseling and coordinating care related to the above assessment and plan.

## 2022-11-02 NOTE — TOC Progression Note (Signed)
Transition of Care [facility]) - Progression Note    Patient Details  Name: Tanya Cole MRN: [id number] Date of Birth: 10-25-1933  [facility]) CM/SW Contact  Gerilyn Pilgrim, LCSW Phone Number: 11/02/2022, 1:51 PM  Clinical Narrative:   SW spoke with patients daughter [name] reports that she would like to pursue LTC at [facility] or at [facility]. CSW has sent referral for both.     Expected Discharge Plan:  (waitiing on recs from team) Barriers to Discharge: Continued Medical Work up  Expected Discharge Plan and Services                                               Social Determinants of Health (SDOH) Interventions SDOH Screenings   Food Insecurity: No Food Insecurity (10/26/2022)  Housing: Low Risk  (10/26/2022)  Transportation Needs: No Transportation Needs (10/26/2022)  Utilities: Not At Risk (10/26/2022)  Alcohol Screen: Low Risk  (05/25/2022)  Depression (PHQ2-9): High Risk (10/18/2022)  Financial Resource Strain: Low Risk  (05/27/2022)  Physical Activity: Insufficiently Active (01/07/2019)  Social Connections: Moderately Isolated (05/27/2022)  Stress: No Stress Concern Present (05/27/2022)  Tobacco Use: Low Risk  (11/01/2022)    Readmission Risk Interventions    05/30/2022   10:28 AM 05/08/2022   10:08 AM 05/14/2021    9:26 AM  Readmission Risk Prevention Plan  Transportation Screening Complete Complete Complete  PCP or Specialist Appt within 5-7 Days   Complete  Home Care Screening   Complete  Medication Review (RN CM)   Referral to Pharmacy  Medication Review (RN Care Manager) Complete Complete   PCP or Specialist appointment within 3-5 days of discharge  Complete   HRI or Home Care Consult Complete Complete   SW Recovery Care/Counseling Consult Complete Complete   Palliative Care Screening Not Applicable Not Applicable   Skilled Nursing Facility  Complete

## 2022-11-02 NOTE — Progress Notes (Signed)
Mobility Specialist - Progress Note   11/02/22 1000  Mobility  Activity Turned to right side;Turned to left side;Transferred from bed to chair  Level of Assistance +2 (takes two people)  Distance Ambulated (ft) 0 ft  $Mobility charge 1 Mobility     Pt lying in bed upon arrival, utilizing RA. Lethargic but arousable. Rolled L/R with totalA. Voiced general soreness all over. Slide transfer to chair with totalA +2. Pt left in chair with alarm set, needs in reach.    Kathee Delton Mobility Specialist 11/02/22, 10:29 AM

## 2022-11-02 NOTE — Progress Notes (Signed)
[facility]) Hospital Liaison note:  Notified by Quinn Axe, NP of request for [place] services at SNF. TOC aware. Will continue to follow for disposition.  Please call with any outpatient palliative questions or concerns.  Thank you for the opportunity to participate in this patient's care.  Thank you, Lorelee Market, LPN [facility] Liaison [phone]

## 2022-11-02 NOTE — Progress Notes (Signed)
[facility]                                                                               [name], is a 89 y.o. female, DOB - [birth year], LGX:[id number] Admit date - 10/24/2022    Outpatient Primary MD for the patient is Einar Pheasant, MD  LOS - 8  days    Brief summary   Patient is a 89 year old female with past medical history of diastolic CHF, VRE UTI previously, polymyalgia rheumatica, paroxysmal atrial fibrillation and senile dementia brought into the emergency room on 12/25 for visual hallucinations.  Workup revealed urinary tract infection and patient brought in for further evaluation.  Infection treated and patient felt to be back at baseline.  Palliative care and discussion with family about disposition some family members feel he cannot care for her at home any longer.   Assessment & Plan    Assessment and Plan: Visual hallucinations/delirious/ Acute metabolic encephalopathy in the setting of significant dementia with behavioral disturbances-resolved (underlying dementia persistent) Patient here with visual hallucinations.  Has history of psychiatric illness as well as underlying dementia.  Also found to have acute UTI.  Continue antibiotics.  Psychiatry following.  Depakote added.  Patient looks to be having some improvement and is interacting with me appropriately.  Patient continues to have underlying dementia, but hallucinations looks to be resolved and she is much more interactive.  Trying to maintain balance between keeping patient calm, but not oversedated.  Appreciate palliative care help.  Will try decreasing Ativan to 0.25 p.o. twice daily.  Urinary tract infection-resolved Urine culture showing E. coli sensitive to Rocephin continue the same.  Completed antibiotic course.  Fever on 12/28: Unclear etiology.  White blood cell now normalized  Hypertension Blood pressure parameters are well controlled.  continue with amlodipine and  metoprolol.    Prolonged QT interval Recheck EKG reviewed.   keep potassium greater than 4 and magnesium greater than 2  Dementia with behavioral disturbance (HCC) Holding Aricept and mirtazapine due to prolonged QT Patient was previously on Seroquel which was discontinued psych/[facility] psych consulted.  Depakote added. She will probably benefit from memory care unit placement.   Polymyalgia rheumatica syndrome (HCC) History of steroid-induced psychosis Continue with methotrexate.  Patient has history of steroid induced psychosis currently not on steroids.  Diabetes mellitus ([place]) Continue with sliding scale insulin. CBG (last 3)  Recent Labs    11/01/22 2022 11/02/22 0814 11/02/22 1205  GLUCAP 143* 175* 193*     CAD (coronary artery disease) Resume aspirin, metoprolol and statin  Atrial fibrillation (HCC) Rate controlled with metoprolol, not on anticoagulation due to recurrent falls  COPD (chronic obstructive pulmonary disease) (HCC) No wheezing on exam.  Continue with albuterol nebs as needed  Chronic diastolic CHF (congestive heart failure) (HCC) Clinically euvolemic.  BNP mildly elevated.  Status post 1 dose of Lasix.  Hyperlipidemia:  Lipitor.    Mild normocytic anemia:  Monitor.    Estimated body mass index is 26.24 kg/m as calculated from the following:   Height as of this encounter: 5\' 1"  (1.549 m).   Weight as of this encounter: 63 kg.  Code Status: DNR DVT  Prophylaxis:  enoxaparin (LOVENOX) injection 40 mg Start: 10/25/22 1000   Level of Care: Level of care: Med-Surg Family Communication: Left message for family 1/2  Disposition Plan:     Remains inpatient appropriate:   Long-term care placement.  Family feels they cannot take care of her on their own.  Procedures:  None.   Consultants:   Palliative care consult.  Psychiatry.  Antimicrobials:   Anti-infectives (From admission, onward)    Start     Dose/Rate Route Frequency Ordered Stop    10/25/22 0100  cefTRIAXone (ROCEPHIN) 1 g in sodium chloride 0.9 % 100 mL IVPB        1 g 200 mL/hr over 30 Minutes Intravenous Every 24 hours 10/25/22 0054 10/29/22 0123   10/25/22 0015  cefTRIAXone (ROCEPHIN) 1 g in sodium chloride 0.9 % 100 mL IVPB  Status:  Discontinued        1 g 200 mL/hr over 30 Minutes Intravenous  Once 10/25/22 0008 10/25/22 0057        Medications  Scheduled Meds:  amLODipine  2.5 mg Oral Daily   aspirin  81 mg Oral Daily   atorvastatin  40 mg Oral Daily   Chlorhexidine Gluconate Cloth  6 each Topical Daily   divalproex  125 mg Oral Q12H   enoxaparin (LOVENOX) injection  40 mg Subcutaneous D32I   folic acid  1 mg Oral Daily   gabapentin  100 mg Oral QHS   insulin aspart  0-15 Units Subcutaneous TID WC   insulin aspart  0-5 Units Subcutaneous QHS   LORazepam  0.5 mg Oral BID   methotrexate  12.5 mg Oral Q Sat   metoprolol tartrate  25 mg Oral BID   polyethylene glycol  17 g Oral Daily   Continuous Infusions:   PRN Meds:.acetaminophen **OR** acetaminophen, albuterol, diclofenac Sodium, nitroGLYCERIN, ziprasidone    Subjective:   More somnolent today. Objective:   Vitals:   11/01/22 0509 11/01/22 0744 11/01/22 2020 11/02/22 0811  BP: 136/66 (!) 158/72 (!) 134/55 (!) 144/75  Pulse: 85 95 (!) 101 98  Resp: 20 19 19 19   Temp: 97.8 F (36.6 C) 98.2 F (36.8 C) 99.5 F (37.5 C) 97.8 F (36.6 C)  TempSrc: Oral     SpO2: 94% 96% 93% 95%  Weight:      Height:        Intake/Output Summary (Last 24 hours) at 11/02/2022 1249 Last data filed at 11/01/2022 2100 Gross per 24 hour  Intake 60 ml  Output --  Net 60 ml    Filed Weights   10/24/22 2145  Weight: 63 kg     Exam General exam: Somnolent Respiratory system: Clear to auscultation bilaterally Cardiovascular system: Regular rate and rhythm, S1-S2 Gastrointestinal system: Soft nontender, nondistended, positive bowel sounds Extremities: No clubbing or cyanosis or edema Skin: No  skin breaks, tears or lesions Psychiatry: No acute agitation although somnolent    Data Reviewed:  No labs today.  Radiology Studies: No results found.     Annita Brod M.D. [facility] 11/02/2022, 12:49 PM  Available via Epic secure chat 7am-7pm After 7 pm, please refer to night coverage provider listed on amion.

## 2022-11-03 DIAGNOSIS — R443 Hallucinations, unspecified: Secondary | ICD-10-CM | POA: Diagnosis not present

## 2022-11-03 DIAGNOSIS — N3000 Acute cystitis without hematuria: Secondary | ICD-10-CM | POA: Diagnosis not present

## 2022-11-03 DIAGNOSIS — F03918 Unspecified dementia, unspecified severity, with other behavioral disturbance: Secondary | ICD-10-CM | POA: Diagnosis not present

## 2022-11-03 DIAGNOSIS — G9341 Metabolic encephalopathy: Secondary | ICD-10-CM | POA: Diagnosis not present

## 2022-11-03 LAB — GLUCOSE, CAPILLARY
Glucose-Capillary: 145 mg/dL — ABNORMAL HIGH (ref 70–99)
Glucose-Capillary: 151 mg/dL — ABNORMAL HIGH (ref 70–99)
Glucose-Capillary: 143 mg/dL — ABNORMAL HIGH (ref 70–99)
Glucose-Capillary: 160 mg/dL — ABNORMAL HIGH (ref 70–99)

## 2022-11-03 MED ORDER — LORAZEPAM 0.5 MG PO TABS: 0.5000 mg | ORAL_TABLET | Freq: Every day | ORAL | Status: DC

## 2022-11-03 MED ORDER — LORAZEPAM 0.5 MG PO TABS
0.5000 mg | ORAL_TABLET | Freq: Every day | ORAL | Status: DC
  Administered 2022-11-03 – 2022-11-13 (×11): 0.5 mg via ORAL
  Filled 2022-11-03 (×11): qty 1

## 2022-11-03 MED ORDER — LORAZEPAM 0.5 MG PO TABS
0.2500 mg | ORAL_TABLET | Freq: Every day | ORAL | Status: DC
  Administered 2022-11-04 – 2022-11-14 (×10): 0.25 mg via ORAL
  Filled 2022-11-03 (×11): qty 1

## 2022-11-03 NOTE — TOC Progression Note (Signed)
Transition of Care [facility]) - Progression Note    Patient Details  Name: Tanya Cole MRN: [id number] Date of Birth: August 28, 1933  Transition of Care [facility]) CM/SW Contact  Gerilyn Pilgrim, LCSW Phone Number: 11/03/2022, 10:38 AM  Clinical Narrative:   [name] reports they are considering taking the pt but are unable to currently due to an outbreak of covid. SW will follow up with facilty to see when they anticipate being able to accept.     Expected Discharge Plan:  (waitiing on recs from team) Barriers to Discharge: Continued Medical Work up  Expected Discharge Plan and Services                                               Social Determinants of Health (SDOH) Interventions SDOH Screenings   Food Insecurity: No Food Insecurity (10/26/2022)  Housing: Low Risk  (10/26/2022)  Transportation Needs: No Transportation Needs (10/26/2022)  Utilities: Not At Risk (10/26/2022)  Alcohol Screen: Low Risk  (05/25/2022)  Depression (PHQ2-9): High Risk (10/18/2022)  Financial Resource Strain: Low Risk  (05/27/2022)  Physical Activity: Insufficiently Active (01/07/2019)  Social Connections: Moderately Isolated (05/27/2022)  Stress: No Stress Concern Present (05/27/2022)  Tobacco Use: Low Risk  (11/01/2022)    Readmission Risk Interventions    05/30/2022   10:28 AM 05/08/2022   10:08 AM 05/14/2021    9:26 AM  Readmission Risk Prevention Plan  Transportation Screening Complete Complete Complete  PCP or Specialist Appt within 5-7 Days   Complete  Home Care Screening   Complete  Medication Review (RN CM)   Referral to Pharmacy  Medication Review (RN Care Manager) Complete Complete   PCP or Specialist appointment within 3-5 days of discharge  Complete   HRI or Home Care Consult Complete Complete   SW Recovery Care/Counseling Consult Complete Complete   Palliative Care Screening Not Applicable Not Applicable   Skilled Nursing Facility  Complete

## 2022-11-03 NOTE — Progress Notes (Signed)
[facility]                                                                               [name], is a 89 y.o. female, DOB - 11/15/1932, ERX:[id number] Admit date - 10/24/2022    Outpatient Primary MD for the patient is Einar Pheasant, MD  LOS - 9  days    Brief summary   Patient is a 89 year old female with past medical history of diastolic CHF, VRE UTI previously, polymyalgia rheumatica, paroxysmal atrial fibrillation and senile dementia brought into the emergency room on 12/25 for visual hallucinations.  Workup revealed urinary tract infection and patient brought in for further evaluation.  Infection treated and patient felt to be back at baseline.  Palliative care and discussion with family about disposition some family members feel he cannot care for her at home any longer.  Plan is for patient to go to skilled nursing with palliative care following.   Assessment & Plan    Assessment and Plan: Visual hallucinations/delirious/ Acute metabolic encephalopathy in the setting of significant dementia with behavioral disturbances-resolved (underlying dementia persistent) Patient here with visual hallucinations.  Has history of psychiatric illness as well as underlying dementia.  Also found to have acute UTI.  Continue antibiotics.  Psychiatry following.  Depakote added.  Patient looks to be having some improvement and is interacting with me appropriately.  Patient continues to have underlying dementia, but hallucinations looks to be resolved and she is much more interactive.  Trying to maintain balance between keeping patient calm, but not oversedated.  Appreciate palliative care help.  Attempted to decrease Ativan to 0.25 twice daily patient more agitated at night.  Will change to 0 point 5 at night and 0.25 during the day  Urinary tract infection-resolved Urine culture showing E. coli sensitive to Rocephin continue the same.  Completed antibiotic course.  Fever on  12/28: Unclear etiology.  White blood cell now normalized  Hypertension Blood pressure parameters are well controlled.  continue with amlodipine and metoprolol.    Prolonged QT interval Recheck EKG reviewed.   keep potassium greater than 4 and magnesium greater than 2  Dementia with behavioral disturbance (HCC) Holding Aricept and mirtazapine due to prolonged QT Patient was previously on Seroquel which was discontinued psych/[facility] psych consulted.  Depakote added. She will probably benefit from memory care unit placement.   Polymyalgia rheumatica syndrome (HCC) History of steroid-induced psychosis Continue with methotrexate.  Patient has history of steroid induced psychosis currently not on steroids.  Diabetes mellitus ([place]) Continue with sliding scale insulin. CBG (last 3)  Recent Labs    11/02/22 2304 11/03/22 0735 11/03/22 1149  GLUCAP 130* 145* 151*     CAD (coronary artery disease) Resume aspirin, metoprolol and statin  Atrial fibrillation (HCC) Rate controlled with metoprolol, not on anticoagulation due to recurrent falls  COPD (chronic obstructive pulmonary disease) (HCC) No wheezing on exam.  Continue with albuterol nebs as needed  Chronic diastolic CHF (congestive heart failure) (HCC) Clinically euvolemic.  BNP mildly elevated.  Status post 1 dose of Lasix.  Hyperlipidemia:  Lipitor.    Mild normocytic anemia:  Monitor.    Estimated body mass index is  26.24 kg/m as calculated from the following:   Height as of this encounter: 5\' 1"  (1.549 m).   Weight as of this encounter: 63 kg.  Code Status: DNR DVT Prophylaxis:  enoxaparin (LOVENOX) injection 40 mg Start: 10/25/22 1000   Level of Care: Level of care: Med-Surg Family Communication: Family updated 1/3  Disposition Plan:     Remains inpatient appropriate:   Long-term care placement.  Family feels they cannot take care of her on their own.  Procedures:  None.   Consultants:   Palliative  care consult.  Psychiatry.  Antimicrobials:   Anti-infectives (From admission, onward)    Start     Dose/Rate Route Frequency Ordered Stop   10/25/22 0100  cefTRIAXone (ROCEPHIN) 1 g in sodium chloride 0.9 % 100 mL IVPB        1 g 200 mL/hr over 30 Minutes Intravenous Every 24 hours 10/25/22 0054 10/29/22 0123   10/25/22 0015  cefTRIAXone (ROCEPHIN) 1 g in sodium chloride 0.9 % 100 mL IVPB  Status:  Discontinued        1 g 200 mL/hr over 30 Minutes Intravenous  Once 10/25/22 0008 10/25/22 0057        Medications  Scheduled Meds:  amLODipine  2.5 mg Oral Daily   aspirin  81 mg Oral Daily   atorvastatin  40 mg Oral Daily   Chlorhexidine Gluconate Cloth  6 each Topical Daily   divalproex  125 mg Oral Q12H   enoxaparin (LOVENOX) injection  40 mg Subcutaneous D63O   folic acid  1 mg Oral Daily   gabapentin  100 mg Oral QHS   insulin aspart  0-15 Units Subcutaneous TID WC   insulin aspart  0-5 Units Subcutaneous QHS   LORazepam  0.25 mg Oral BID   methotrexate  12.5 mg Oral Q Sat   metoprolol tartrate  25 mg Oral BID   polyethylene glycol  17 g Oral Daily   Continuous Infusions:   PRN Meds:.acetaminophen **OR** acetaminophen, albuterol, diclofenac Sodium, nitroGLYCERIN, ziprasidone    Subjective:   More somnolent today. Objective:   Vitals:   11/02/22 0811 11/02/22 1622 11/03/22 0649 11/03/22 0732  BP: (!) 144/75 (!) 142/83 137/71 133/70  Pulse: 98 87 86 84  Resp: 19 19 18 19   Temp: 97.8 F (36.6 C) 98.3 F (36.8 C) (!) 97.3 F (36.3 C) 97.8 F (36.6 C)  TempSrc:   Oral   SpO2: 95% 92% 99% 93%  Weight:      Height:       No intake or output data in the 24 hours ending 11/03/22 1445  Filed Weights   10/24/22 2145  Weight: 63 kg     Exam General exam: Somnolent Respiratory system: Clear to auscultation bilaterally Cardiovascular system: Regular rate and rhythm, S1-S2 Gastrointestinal system: Soft nontender, nondistended, positive bowel  sounds Extremities: No clubbing or cyanosis or edema Skin: No skin breaks, tears or lesions Psychiatry: No acute agitation although somnolent    Data Reviewed:  No labs today.  Radiology Studies: No results found.     Annita Brod M.D. [facility] 11/03/2022, 2:45 PM  Available via Epic secure chat 7am-7pm After 7 pm, please refer to night coverage provider listed on amion.

## 2022-11-04 DIAGNOSIS — Z515 Encounter for palliative care: Secondary | ICD-10-CM | POA: Diagnosis not present

## 2022-11-04 DIAGNOSIS — G9341 Metabolic encephalopathy: Secondary | ICD-10-CM | POA: Diagnosis not present

## 2022-11-04 DIAGNOSIS — F03918 Unspecified dementia, unspecified severity, with other behavioral disturbance: Secondary | ICD-10-CM | POA: Diagnosis not present

## 2022-11-04 DIAGNOSIS — Z7189 Other specified counseling: Secondary | ICD-10-CM | POA: Diagnosis not present

## 2022-11-04 LAB — GLUCOSE, CAPILLARY
Glucose-Capillary: 162 mg/dL — ABNORMAL HIGH (ref 70–99)
Glucose-Capillary: 205 mg/dL — ABNORMAL HIGH (ref 70–99)
Glucose-Capillary: 172 mg/dL — ABNORMAL HIGH (ref 70–99)
Glucose-Capillary: 182 mg/dL — ABNORMAL HIGH (ref 70–99)

## 2022-11-04 MED ORDER — LORAZEPAM 0.5 MG PO TABS
0.2500 mg | ORAL_TABLET | Freq: Four times a day (QID) | ORAL | Status: DC | PRN
  Administered 2022-11-04 – 2022-11-14 (×8): 0.25 mg via ORAL
  Filled 2022-11-04 (×8): qty 1

## 2022-11-04 NOTE — Progress Notes (Addendum)
[facility]                                                                               [name], is a 89 y.o. female, DOB - Jan 30, 1933, [id number] Admit date - 10/24/2022    Outpatient Primary MD for the patient is Einar Pheasant, MD  LOS - 10  days    Brief summary   Patient is a 89 year old female with past medical history of diastolic CHF, VRE UTI previously, polymyalgia rheumatica, paroxysmal atrial fibrillation and senile dementia brought into the emergency room on 12/25 for visual hallucinations.  Workup revealed urinary tract infection and patient brought in for further evaluation.  Infection treated and patient felt to be back at baseline.  Palliative care and discussion with family about disposition some family members feel he cannot care for her at home any longer.  Plan is for patient to go to skilled nursing with palliative care following.   Assessment & Plan    Assessment and Plan: Visual hallucinations/delirious/ Acute metabolic encephalopathy in the setting of significant dementia with behavioral disturbances-resolved (underlying dementia persistent) Patient here with visual hallucinations.  Has history of psychiatric illness as well as underlying dementia.  Also found to have acute UTI.  Continue antibiotics.  Psychiatry following.  Depakote added.  Patient looks to be having some improvement and is interacting with me appropriately.  Patient continues to have underlying dementia, but hallucinations looks to be resolved and she is much more interactive.  Trying to maintain balance between keeping patient calm, but not oversedated.  Appreciate palliative care help.  Attempted to decrease Ativan to 0.25 twice daily patient more agitated at night.  Will change to 0 point 5 at night and 0.25 during the day  Urinary tract infection-resolved Urine culture showing E. coli sensitive to Rocephin continue the same.  Completed antibiotic course.  Fever  on 12/28: Unclear etiology.  White blood cell now normalized  Hypertension Blood pressure parameters are well controlled.  continue with amlodipine and metoprolol.    Prolonged QT interval Recheck EKG reviewed.   keep potassium greater than 4 and magnesium greater than 2  Dementia with behavioral disturbance (HCC) Holding Aricept and mirtazapine due to prolonged QT Patient was previously on Seroquel which was discontinued psych/[facility] psych consulted.  Depakote added. Skilled nursing facility pending  Polymyalgia rheumatica syndrome ([place]) History of steroid-induced psychosis Continue with methotrexate.  Patient has history of steroid induced psychosis currently not on steroids.  Diabetes mellitus ([place]) Continue with sliding scale insulin. CBG (last 3)  Recent Labs    11/03/22 1957 11/04/22 0734 11/04/22 1141  GLUCAP 160* 162* 205*     CAD (coronary artery disease) Resume aspirin, metoprolol and statin  Atrial fibrillation (HCC) Rate controlled with metoprolol, not on anticoagulation due to recurrent falls  COPD (chronic obstructive pulmonary disease) (HCC) No wheezing on exam.  Continue with albuterol nebs as needed  Chronic diastolic CHF (congestive heart failure) (HCC) Clinically euvolemic.  BNP mildly elevated.  Status post 1 dose of Lasix.  Hyperlipidemia:  Lipitor.    Mild normocytic anemia:  Monitor.    Estimated body mass index is 26.24 kg/m as calculated from the  following:   Height as of this encounter: 5\' 1"  (1.549 m).   Weight as of this encounter: 63 kg.  Code Status: DNR DVT Prophylaxis:  enoxaparin (LOVENOX) injection 40 mg Start: 10/25/22 1000   Level of Care: Level of care: Med-Surg Family Communication: Updated daughter by phone 1/4  Disposition Plan:     Remains inpatient appropriate:   Long-term care placement.  Family feels they cannot take care of her on their own.  Procedures:  None.   Consultants:   Palliative care consult.   Psychiatry.  Antimicrobials:   Anti-infectives (From admission, onward)    Start     Dose/Rate Route Frequency Ordered Stop   10/25/22 0100  cefTRIAXone (ROCEPHIN) 1 g in sodium chloride 0.9 % 100 mL IVPB        1 g 200 mL/hr over 30 Minutes Intravenous Every 24 hours 10/25/22 0054 10/29/22 0123   10/25/22 0015  cefTRIAXone (ROCEPHIN) 1 g in sodium chloride 0.9 % 100 mL IVPB  Status:  Discontinued        1 g 200 mL/hr over 30 Minutes Intravenous  Once 10/25/22 0008 10/25/22 0057        Medications  Scheduled Meds:  amLODipine  2.5 mg Oral Daily   aspirin  81 mg Oral Daily   atorvastatin  40 mg Oral Daily   Chlorhexidine Gluconate Cloth  6 each Topical Daily   divalproex  125 mg Oral Q12H   enoxaparin (LOVENOX) injection  40 mg Subcutaneous O13Y   folic acid  1 mg Oral Daily   gabapentin  100 mg Oral QHS   insulin aspart  0-15 Units Subcutaneous TID WC   insulin aspart  0-5 Units Subcutaneous QHS   LORazepam  0.25 mg Oral Daily   LORazepam  0.5 mg Oral QHS   methotrexate  12.5 mg Oral Q Sat   metoprolol tartrate  25 mg Oral BID   polyethylene glycol  17 g Oral Daily   Continuous Infusions:   PRN Meds:.acetaminophen **OR** acetaminophen, albuterol, diclofenac Sodium, nitroGLYCERIN, ziprasidone    Subjective:   Awake, slightly anxious Objective:   Vitals:   11/03/22 1856 11/04/22 0504 11/04/22 0737 11/04/22 0920  BP: 133/67 125/70 (!) 140/66 (!) 149/69  Pulse: 88 88 92 91  Resp:  14 18   Temp: 97.9 F (36.6 C) (!) 97.5 F (36.4 C) 98 F (36.7 C)   TempSrc: Oral Oral    SpO2: 93% 94% 96%   Weight:      Height:        Intake/Output Summary (Last 24 hours) at 11/04/2022 1507 Last data filed at 11/04/2022 1015 Gross per 24 hour  Intake --  Output 800 ml  Net -800 ml    Filed Weights   10/24/22 2145  Weight: 63 kg     Exam General exam: Awake, not agitated Respiratory system: Clear to auscultation bilaterally Cardiovascular system: Regular rate and  rhythm, S1-S2 Gastrointestinal system: Soft nontender, nondistended, positive bowel sounds Extremities: No clubbing or cyanosis or edema Skin: No skin breaks, tears or lesions Psychiatry: No acute agitation    Data Reviewed:  No labs today.  Radiology Studies: No results found.     Annita Brod M.D. [facility] 11/04/2022, 3:07 PM  Available via Epic secure chat 7am-7pm After 7 pm, please refer to night coverage provider listed on amion.  No charge for this visit

## 2022-11-04 NOTE — Care Management Important Message (Signed)
Important Message  Patient Details  Name: Tanya Cole MRN: [id number] Date of Birth: 05/12/1933   Medicare Important Message Given:  Other (see comment)  Left Voice message for patients POA, Monasia Lair at home number [phone] to review the Important message from medicare. Will await a return call.    [name] 11/04/2022, 11:20 AM

## 2022-11-04 NOTE — Progress Notes (Addendum)
Mobility Specialist - Progress Note   11/04/22 0920  Therapy Vitals  Pulse Rate 91  BP (!) 149/69  Patient Position (if appropriate) Sitting  Mobility  Activity Transferred from bed to chair;Dangled on edge of bed  Level of Assistance Maximum assist, patient does 25-49%  Assistive Device Other (Comment) (slide transfer)  Activity Response Tolerated fair  $Mobility charge 1 Mobility     Pt lying in bed upon arrival, utilizing RA. Lethargic and with minimal participation this date but does engage verbally throughout session. Pt reports pain in neck. Wash cloth to face, glasses cleaned, and transfer to EOB for increased stimuli. Set-up assist to wash face. Mod-maxA only for trunk support to achieve EOB sitting. Poor sitting balance---post and R lateral lean. Slide transfer to recliner with alarm set, needs in reach. Towel roll for neck support.     Kathee Delton Mobility Specialist 11/04/22, 2:34 PM

## 2022-11-04 NOTE — Progress Notes (Signed)
Palliative: Tanya Cole is sitting up quietly in bed.  She appears chronically ill and elderly, obese.  She keeps her eyes closed, but will open them when asked.  She is somewhat more alert today, but does not answer orientation questions.  I am not sure that she can make her basic needs known.  Mobility specialist and bedside nursing staff are present attending to needs.  Overall, Tanya Cole has seen small improvements.  Her family has elected long-term care placement.  Transition of care team is working diligently to assist patient and family.  ACC for outpatient palliative services for further goals of care discussions.  Conference with attending, bedside nursing staff, transition of care team related to patient condition, needs, goals of care, disposition.  Plan: At this point continue to treat the treatable but no CPR or intubation.  Seeking long-term care placement.  Outpatient palliative services through [facility].  We have discussed transitioning to hospice care when appropriate. PMT to sign off.  Please reconsult when appropriate.  83 minutes Tanya Axe, NP Palliative medicine team Team phone [phone] Greater than 50% of this time was spent counseling and coordinating care related to the above assessment and plan.

## 2022-11-04 NOTE — Progress Notes (Signed)
Physical Therapy Treatment Patient Details Name: Tanya Cole MRN: [id number] DOB: Jun 04, 1933 Today's Date: 11/04/2022   History of Present Illness 89 y.o. female with medical history significant for DM, HTN, diastolic CHF, CAD, polymyalgia rheumatica, paroxysmal atrial fibrillation not on anticoagulation due to high fall risk, dementia who was brought to the ED with confusion x 1 to 2 days beyond her baseline associated with visual hallucinations.  Workup thus far has been unremarkable except for abnormal urinalysis.  She was also found to have QTc prolongation hence psychogenic medications are limited.    PT Comments    Pt was supine in bed with lights on but eyes closed. She does awake to [employer] voice but keeps eyes closed. With Vcs she is able to open eyes but only able to maintain for very short periods. " Just take me right there to the porch." Pt is only oriented to self. Author attempts to reorient pt however pt becomes very tearful. Throughout remainder of session, pt's eyes were either closed or open/ crying. Poor overall participation in desired task requested. Extensive assistance required to achieve EOB sitting. Elected not to transfer to chair due to time of day and pt's overall mental state. Acute PT will continue efforts to progress pt to PLOF. Recommend continued skilled PT going forward.   Recommendations for follow up therapy are one component of a multi-disciplinary discharge planning process, led by the attending physician.  Recommendations may be updated based on patient status, additional functional criteria and insurance authorization.  Follow Up Recommendations  Skilled nursing-short term rehab (<3 hours/day)     Assistance Recommended at Discharge Frequent or constant Supervision/Assistance  Patient can return home with the following A lot of help with walking and/or transfers;A lot of help with bathing/dressing/bathroom;Assistance with cooking/housework;Assistance  with feeding;Direct supervision/assist for medications management;Direct supervision/assist for financial management;Assist for transportation;Help with stairs or ramp for entrance   Equipment Recommendations  Other (comment) (defer to next level of care)       Precautions / Restrictions Precautions Precautions: Fall Restrictions Weight Bearing Restrictions: No     Mobility  Bed Mobility Overal bed mobility: Needs Assistance Bed Mobility: Rolling, Supine to Sit, Sit to Supine Rolling: Max assist Supine to sit: Max assist Sit to supine: Total assist   General bed mobility comments: pt required extensive assistance to fully achieve EOB short sit. Pt continued to require constant vcs to keep eyes open. When eyes were open pt was crying. Total assisted back to supine. Pt's cognition, alertness and emotional state greatly impacts session progression    Transfers  General transfer comment: Pryor Curia elects not to get patient OOB since it would have been total assist. Non funtional. recommend use of mechanical lift for OOB activity at this time. PT will contiue efforts to progress pt as able per current POC.       Balance Overall balance assessment: Needs assistance Sitting-balance support: Feet supported, Single extremity supported Sitting balance-Leahy Scale: Poor         Cognition Arousal/Alertness: Lethargic Behavior During Therapy:  (tearful) Overall Cognitive Status: No family/caregiver present to determine baseline cognitive functioning Area of Impairment: Orientation, Attention, Safety/judgement, Awareness, Problem solving      General Comments: Pt's eyes were closed upon entry however pt responds to greeting but kept eyes closed until cue to open them. sghe thinks she is at home and requested author to "put me on the porch." Author did best to reorient pt to situation. Pt becomes very emotional throughout remainder of  session. Constant vcs for focus on desired task.                Pertinent Vitals/Pain Pain Assessment Breathing: normal Negative Vocalization: none Facial Expression: sad, frightened, frown Body Language: relaxed Consolability: unable to console, distract or reassure PAINAD Score: 3     PT Goals (current goals can now be found in the care plan section) Acute Rehab PT Goals Patient Stated Goal: none stated    Frequency    Min 2X/week      PT Plan Current plan remains appropriate       AM-PAC PT "6 Clicks" Mobility   Outcome Measure  Help needed turning from your back to your side while in a flat bed without using bedrails?: A Lot Help needed moving from lying on your back to sitting on the side of a flat bed without using bedrails?: A Lot Help needed moving to and from a bed to a chair (including a wheelchair)?: A Lot Help needed standing up from a chair using your arms (e.g., wheelchair or bedside chair)?: Total Help needed to walk in hospital room?: Total Help needed climbing 3-5 steps with a railing? : Total 6 Click Score: 9    End of Session   Activity Tolerance: Other (comment) (limited by pt participation and cognition) Patient left: in bed;with call bell/phone within reach;with bed alarm set Nurse Communication: Mobility status PT Visit Diagnosis: Other abnormalities of gait and mobility (R26.89);Muscle weakness (generalized) (M62.81);Difficulty in walking, not elsewhere classified (R26.2)     Time:  -     Charges:                        Julaine Fusi PTA 11/04/22, 7:26 AM

## 2022-11-05 DIAGNOSIS — N3000 Acute cystitis without hematuria: Secondary | ICD-10-CM | POA: Diagnosis not present

## 2022-11-05 DIAGNOSIS — F03918 Unspecified dementia, unspecified severity, with other behavioral disturbance: Secondary | ICD-10-CM | POA: Diagnosis not present

## 2022-11-05 DIAGNOSIS — R443 Hallucinations, unspecified: Secondary | ICD-10-CM | POA: Diagnosis not present

## 2022-11-05 DIAGNOSIS — G9341 Metabolic encephalopathy: Secondary | ICD-10-CM | POA: Diagnosis not present

## 2022-11-05 LAB — GLUCOSE, CAPILLARY
Glucose-Capillary: 166 mg/dL — ABNORMAL HIGH (ref 70–99)
Glucose-Capillary: 144 mg/dL — ABNORMAL HIGH (ref 70–99)
Glucose-Capillary: 203 mg/dL — ABNORMAL HIGH (ref 70–99)
Glucose-Capillary: 147 mg/dL — ABNORMAL HIGH (ref 70–99)

## 2022-11-05 NOTE — Progress Notes (Addendum)
[facility]                                                                               [name], is a 89 y.o. female, DOB - Sep 06, 1933, VEL:[id number] Admit date - 10/24/2022    Outpatient Primary MD for the patient is Dale Ringsted, MD  LOS - 11  days    Brief summary   Patient is a 89 year old female with past medical history of diastolic CHF, VRE UTI previously, polymyalgia rheumatica, paroxysmal atrial fibrillation and senile dementia brought into the emergency room on 12/25 for visual hallucinations.  Workup revealed urinary tract infection and patient brought in for further evaluation.  Infection treated and patient felt to be back at baseline.  Palliative care and discussion with family about disposition some family members feel he cannot care for her at home any longer.  Plan is for patient to go to skilled nursing with palliative care following.   Assessment & Plan    Assessment and Plan: Visual hallucinations/delirious/ Acute metabolic encephalopathy in the setting of significant dementia with behavioral disturbances-resolved (underlying dementia persistent) Patient initially presented with visual hallucinations.  She has a previous history of psychiatric illness as well as underlying dementia, and was also found to have acute UTI.  Psychiatry following.  Depakote added.  Patient looks to be having some improvement and is interacting with me appropriately.  Patient continues to have underlying dementia, but hallucinations looks to be resolved and she is much more interactive.  Trying to maintain balance between keeping patient calm, but not oversedated.  Appreciate palliative care help.  Currently patient on 0.25 mg in the morning and 0.5 mg at night with 0.25 twice daily as needed, which patient seems to be tolerating.  E. coli sepsis secondary to urinary tract infection-resolved Patient met criteria for sepsis on admission given leukocytosis,  tachycardia and fever.  Urine culture showing E. coli sensitive to Rocephin.  Completed antibiotic course.  Fever on 12/28: Unclear etiology.  White blood cell now normalized  Hypertension Blood pressure parameters are well controlled.  continue with amlodipine and metoprolol.    Prolonged QT interval Recheck EKG reviewed.   keep potassium greater than 4 and magnesium greater than 2  Dementia with behavioral disturbance (HCC) Holding Aricept and mirtazapine due to prolonged QT Patient was previously on Seroquel which was discontinued psych/[facility] psych consulted.  Depakote added. Skilled nursing facility pending  Polymyalgia rheumatica syndrome (HCC) History of steroid-induced psychosis Continue with methotrexate.  Patient has history of steroid induced psychosis currently not on steroids.  Diabetes mellitus (HCC) Continue with sliding scale insulin. CBG (last 3)  Recent Labs    11/04/22 2141 11/05/22 0854 11/05/22 1150  GLUCAP 182* 166* 144*     CAD (coronary artery disease) Resume aspirin, metoprolol and statin  Atrial fibrillation (HCC) Rate controlled with metoprolol, not on anticoagulation due to recurrent falls  COPD (chronic obstructive pulmonary disease) (HCC) No wheezing on exam.  Continue with albuterol nebs as needed  Chronic diastolic CHF (congestive heart failure) (HCC) BNP mildly elevated.  Status post 1 dose of Lasix.  Recheck BNP in the morning.  Hyperlipidemia:  Lipitor.    Mild  normocytic anemia:  Monitor.    Estimated body mass index is 26.24 kg/m as calculated from the following:   Height as of this encounter: 5\' 1"  (1.549 m).   Weight as of this encounter: 63 kg.  Code Status: DNR DVT Prophylaxis:  enoxaparin (LOVENOX) injection 40 mg Start: 10/25/22 1000   Level of Care: Level of care: Med-Surg Family Communication: Updated daughter 1/4  Disposition Plan:     Plan is for skilled nursing with palliative care following, hoping to  discharge 1/8  Procedures:  None.   Consultants:   Palliative care consult. Psychiatry.  Antimicrobials:   Anti-infectives (From admission, onward)    Start     Dose/Rate Route Frequency Ordered Stop   10/25/22 0100  cefTRIAXone (ROCEPHIN) 1 g in sodium chloride 0.9 % 100 mL IVPB        1 g 200 mL/hr over 30 Minutes Intravenous Every 24 hours 10/25/22 0054 10/29/22 0123   10/25/22 0015  cefTRIAXone (ROCEPHIN) 1 g in sodium chloride 0.9 % 100 mL IVPB  Status:  Discontinued        1 g 200 mL/hr over 30 Minutes Intravenous  Once 10/25/22 0008 10/25/22 0057        Medications  Scheduled Meds:  amLODipine  2.5 mg Oral Daily   aspirin  81 mg Oral Daily   atorvastatin  40 mg Oral Daily   Chlorhexidine Gluconate Cloth  6 each Topical Daily   divalproex  125 mg Oral Q12H   enoxaparin (LOVENOX) injection  40 mg Subcutaneous R51O   folic acid  1 mg Oral Daily   gabapentin  100 mg Oral QHS   insulin aspart  0-15 Units Subcutaneous TID WC   insulin aspart  0-5 Units Subcutaneous QHS   LORazepam  0.25 mg Oral Daily   LORazepam  0.5 mg Oral QHS   methotrexate  12.5 mg Oral Q Sat   metoprolol tartrate  25 mg Oral BID   polyethylene glycol  17 g Oral Daily   Continuous Infusions:   PRN Meds:.acetaminophen **OR** acetaminophen, albuterol, diclofenac Sodium, LORazepam, nitroGLYCERIN, ziprasidone    Subjective:   Patient calm today Objective:   Vitals:   11/04/22 1607 11/04/22 2004 11/05/22 0623 11/05/22 0852  BP: 137/75 132/62 123/61 (!) 144/72  Pulse: 87 (!) 101 (!) 102 98  Resp: 18 17 16 16   Temp: 99.1 F (37.3 C) 98.1 F (36.7 C) 98.8 F (37.1 C) 98.5 F (36.9 C)  TempSrc:      SpO2: 93% 94% 94% 94%  Weight:      Height:        Intake/Output Summary (Last 24 hours) at 11/05/2022 1407 Last data filed at 11/05/2022 1022 Gross per 24 hour  Intake 300 ml  Output 350 ml  Net -50 ml    Filed Weights   10/24/22 2145  Weight: 63 kg     Physical exam General  exam: Awake, not agitated Respiratory system: Clear to auscultation bilaterally Cardiovascular system: Regular rate and rhythm, S1-S2 Gastrointestinal system: Soft nontender, nondistended, positive bowel sounds Extremities: No clubbing or cyanosis or edema Skin: No skin breaks, tears or lesions Psychiatry: No acute agitation    Data Reviewed:  No labs today.  Radiology Studies: No results found.     Annita Brod M.D. [facility] 11/05/2022, 2:07 PM  Available via Epic secure chat 7am-7pm After 7 pm, please refer to night coverage provider listed on amion.  No charge for this visit

## 2022-11-06 ENCOUNTER — Inpatient Hospital Stay: Payer: 59

## 2022-11-06 ENCOUNTER — Encounter: Payer: Self-pay | Admitting: Internal Medicine

## 2022-11-06 DIAGNOSIS — G9341 Metabolic encephalopathy: Secondary | ICD-10-CM | POA: Diagnosis not present

## 2022-11-06 LAB — CBC
HCT: 37.4 % (ref 36.0–46.0)
Hemoglobin: 12 g/dL (ref 12.0–15.0)
MCH: 29.9 pg (ref 26.0–34.0)
MCHC: 32.1 g/dL (ref 30.0–36.0)
MCV: 93.3 fL (ref 80.0–100.0)
Platelets: 424 10*3/uL — ABNORMAL HIGH (ref 150–400)
RBC: 4.01 MIL/uL (ref 3.87–5.11)
RDW: 15.8 % — ABNORMAL HIGH (ref 11.5–15.5)
WBC: 20.3 10*3/uL — ABNORMAL HIGH (ref 4.0–10.5)
nRBC: 0 % (ref 0.0–0.2)

## 2022-11-06 LAB — GLUCOSE, CAPILLARY
Glucose-Capillary: 192 mg/dL — ABNORMAL HIGH (ref 70–99)
Glucose-Capillary: 194 mg/dL — ABNORMAL HIGH (ref 70–99)
Glucose-Capillary: 225 mg/dL — ABNORMAL HIGH (ref 70–99)
Glucose-Capillary: 240 mg/dL — ABNORMAL HIGH (ref 70–99)

## 2022-11-06 LAB — BASIC METABOLIC PANEL
Anion gap: 13 (ref 5–15)
BUN: 31 mg/dL — ABNORMAL HIGH (ref 8–23)
CO2: 30 mmol/L (ref 22–32)
Calcium: 9.6 mg/dL (ref 8.9–10.3)
Chloride: 101 mmol/L (ref 98–111)
Creatinine, Ser: 0.86 mg/dL (ref 0.44–1.00)
GFR, Estimated: >60 mL/min (ref >60)
Glucose, Bld: 199 mg/dL — ABNORMAL HIGH (ref 70–99)
Potassium: 3.6 mmol/L (ref 3.5–5.1)
Sodium: 144 mmol/L (ref 135–145)

## 2022-11-06 LAB — PROCALCITONIN: Procalcitonin: 0.13 ng/mL

## 2022-11-06 LAB — BRAIN NATRIURETIC PEPTIDE: B Natriuretic Peptide: 84.3 pg/mL (ref 0.0–100.0)

## 2022-11-06 NOTE — Hospital Course (Addendum)
89 year old female with past medical history of diastolic CHF, VRE UTI previously, polymyalgia rheumatica, paroxysmal atrial fibrillation and senile dementia brought into the emergency room on 12/25 for visual hallucinations. Workup revealed urinary tract infection and patient brought in for further evaluation. Infection treated and patient felt to be back at baseline. Palliative care and discussion with family about disposition some family members feel he cannot care for her at home any longer. Plan is for patient to go to skilled nursing with palliative care following .  1/8: Hemodynamically stable.  PT is recommending SNF. Completed a course of antibiotic.  Patient did had a febrile episode early morning on 1/7, remained afebrile since then.  Labs with some improvement of leukocytosis to 16.4, procalcitonin improving and now negative. BMP stable.  Repeat chest x-ray done yesterday with no active disease. It was thought to be related to her PMR.  Patient had an history of steroid-induced psychosis and is currently on methotrexate for PMR  1/9: Patient was eventually able to void this morning after removing Foley catheter yesterday late afternoon.  Received some IV fluid as there was not much volume noted on bladder scan initially.  Still no bed availability for SNF.  1/10: Patient remained medically stable with still no bed offer.  Able to void and eating well. She wants to go home.  1/11: Remained medically stable.  Awaiting SNF placement.  1/12: No new concern.  Still awaiting placement.  1/13: Still awaiting placement.  1/14: Still awaiting placement, apparently prior TOC never even started insurance authorization despite putting in her note and communicating with me.  PT recommendations remain for SNF.  1/15: Remained medically stable.  We obtained insurance authorization and patient is being discharged to rehab for further management. Palliative care should be able to follow at  facility.  She will continue on current medications and need to have a close follow-up with her providers for further recommendations.

## 2022-11-06 NOTE — Progress Notes (Signed)
   11/06/22 0610  Assess: MEWS Score  Temp (!) 101 F (38.3 C)  BP (!) 137/91  MAP (mmHg) 103  Pulse Rate (!) 105  Resp 17  Level of Consciousness Alert  SpO2 92 %  O2 Device Room Air  Assess: MEWS Score  MEWS Temp 1  MEWS Systolic 0  MEWS Pulse 1  MEWS RR 0  MEWS LOC 0  MEWS Score 2  MEWS Score Color Yellow  Assess: if the MEWS score is Yellow or Red  Were vital signs taken at a resting state? Yes  Focused Assessment No change from prior assessment  Does the patient meet 2 or more of the SIRS criteria? Yes  Does the patient have a confirmed or suspected source of infection? Yes  Provider and Rapid Response Notified? Yes  MEWS guidelines implemented *See Row Information* Yes  Treat  MEWS Interventions Administered prn meds/treatments  Take Vital Signs  Increase Vital Sign Frequency  Yellow: Q 2hr X 2 then Q 4hr X 2, if remains yellow, continue Q 4hrs  Escalate  MEWS: Escalate Yellow: discuss with charge nurse/RN and consider discussing with provider and RRT  Notify: Charge Nurse/RN  Name of Charge Nurse/RN Notified Donna,RN  Date Charge Nurse/RN Notified 11/06/22  Time Charge Nurse/RN Notified [id number]  Provider Notification  Provider Name/Title Chinita Pester  Date Provider Notified 11/06/22  Time Provider Notified [phone]  Method of Notification Page  Notification Reason Change in status  Provider response No new orders  Date of Provider Response 11/06/22  Time of Provider Response [phone]  Document  Patient Outcome Other (Comment) (Tylenol given)  Progress note created (see row info) Yes  Assess: SIRS CRITERIA  SIRS Temperature  1  SIRS Pulse 1  SIRS Respirations  0  SIRS WBC 0  SIRS Score Sum  2

## 2022-11-06 NOTE — Progress Notes (Signed)
Physical Therapy Treatment Patient Details Name: Tanya Cole MRN: [id number] DOB: December 04, 1932 Today's Date: 11/06/2022   History of Present Illness 89 y.o. female with medical history significant for DM, HTN, diastolic CHF, CAD, polymyalgia rheumatica, paroxysmal atrial fibrillation not on anticoagulation due to high fall risk, dementia who was brought to the ED with confusion x 1 to 2 days beyond her baseline associated with visual hallucinations.  Workup thus far has been unremarkable except for abnormal urinalysis.  She was also found to have QTc prolongation hence psychogenic medications are limited.    PT Comments    Pt received in bed with family by her side feeding. Pt lethargic but opens her eyes with VC. Pt responded appropriately to greetings and made eye contact with willingness to sit up on the EOB and to get into the chair. Pt stated that she feel very tired. Pt Son stated that he want his mom to go to SNF and then to LTC or LTC with some physical therapy. Pt needs max to total assist with functional mobility at this point with slow  but steady progress with increased pt participation and alertness. PT will continue in acute and pt will benefit from LTC with Follow up PT interventions Otherwise SNF as a step to process.    Recommendations for follow up therapy are one component of a multi-disciplinary discharge planning process, led by the attending physician.  Recommendations may be updated based on patient status, additional functional criteria and insurance authorization.  Follow Up Recommendations  Skilled nursing-short term rehab (<3 hours/day) (family is considering LTC with F/U rehab interventions) Can patient physically be transported by private vehicle: No   Assistance Recommended at Discharge Frequent or constant Supervision/Assistance  Patient can return home with the following A lot of help with bathing/dressing/bathroom;Two people to help with walking and/or  transfers;Assistance with cooking/housework;Assistance with feeding;Direct supervision/assist for medications management;Assist for transportation;Help with stairs or ramp for entrance   Equipment Recommendations  None recommended by PT    Recommendations for Other Services       Precautions / Restrictions Restrictions Weight Bearing Restrictions: No     Mobility  Bed Mobility Overal bed mobility: Needs Assistance Bed Mobility: Rolling, Supine to Sit Rolling: Max assist   Supine to sit: Max assist     General bed mobility comments: pt showed ncreased participation.    Transfers Overall transfer level: Needs assistance Equipment used: None Transfers: Sit to/from Stand, Bed to chair/wheelchair/BSC Sit to Stand: Total assist, Max assist Stand pivot transfers: Total assist         General transfer comment: Pt OOB to imporve alertness and to imporve interraction with environment.    Ambulation/Gait: Unable               General Gait Details: deferred.   Stairs: Unable             Engineer, building services Rankin (Stroke Patients Only)       Balance Overall balance assessment: Needs assistance Sitting-balance support: Feet unsupported, Single extremity supported Sitting balance-Leahy Scale: Fair Sitting balance - Comments: needs sup to prevent leans   Standing balance support: No upper extremity supported Standing balance-Leahy Scale: Zero                              Cognition Arousal/Alertness: Lethargic, Awake/alert Behavior During Therapy: Flat affect, Restless Overall Cognitive Status: History of cognitive impairments - at baseline Area  of Impairment: Orientation, Attention, Safety/judgement, Awareness, Problem solving                 Orientation Level: Disoriented to, Place, Time, Situation Current Attention Level: Selective     Safety/Judgement: Decreased awareness of deficits, Decreased awareness of  safety   Problem Solving: Slow processing, Decreased initiation, Difficulty sequencing, Requires verbal cues, Requires tactile cues General Comments: Pt answers to VC with eyes closed but able ot open when asked for a short time. pt remains in one position until intitiated a change.        Exercises      General Comments        Pertinent Vitals/Pain Pain Assessment Pain Assessment: No/denies pain    Home Living                          Prior Function            PT Goals (current goals can now be found in the care plan section) Acute Rehab PT Goals Patient Stated Goal[facility]. PT Goal Formulation: Patient unable to participate in goal setting Time For Goal Achievement: 11/10/22 Potential to Achieve Goals: Fair Progress towards PT goals: Progressing toward goals    Frequency    Min 2X/week      PT Plan Current plan remains appropriate    Co-evaluation              AM-PAC PT "6 Clicks" Mobility   Outcome Measure  Help needed turning from your back to your side while in a flat bed without using bedrails?: A Lot Help needed moving from lying on your back to sitting on the side of a flat bed without using bedrails?: A Lot Help needed moving to and from a bed to a chair (including a wheelchair)?: Total Help needed standing up from a chair using your arms (e.g., wheelchair or bedside chair)?: A Lot Help needed to walk in hospital room?: Total Help needed climbing 3-5 steps with a railing? : Total 6 Click Score: 9    End of Session Equipment Utilized During Treatment: Gait belt Activity Tolerance: Patient limited by lethargy;Patient limited by fatigue Patient left: in chair;with call bell/phone within reach;with chair alarm set;with family/visitor present Nurse Communication: Mobility status PT Visit Diagnosis: Other abnormalities of gait and mobility (R26.89);Muscle weakness (generalized) (M62.81);Difficulty in  walking, not elsewhere classified (R26.2)     Time: 1856-3149 PT Time Calculation (min) (ACUTE ONLY): 23 min  Charges:  $Neuromuscular Re-education: 23-37 mins                    Franchesca Veneziano PT DPT 2:06 PM,11/06/22

## 2022-11-06 NOTE — Progress Notes (Signed)
PROGRESS NOTE Tanya Cole  DJM:[id number] DOB: 1933/07/22 DOA: 10/24/2022 PCP: Dale [place], MD   Brief Narrative/Hospital Course: 89 year old female with past medical history of diastolic CHF, VRE UTI previously, polymyalgia rheumatica, paroxysmal atrial fibrillation and senile dementia brought into the emergency room on 12/25 for visual hallucinations. Workup revealed urinary tract infection and patient brought in for further evaluation. Infection treated and patient felt to be back at baseline. Palliative care and discussion with family about disposition some family members feel he cannot care for her at home any longer. Plan is for patient to go to skilled nursing with palliative care following    Subjective: Seen and examined Patient had fever early morning 101 and now afebrile. CBC, UA and chest x-ray has been ordered Wbc up at 20k, addded procal and is stable. Bnp was 84 1/6   Assessment and Plan: Principal Problem:   Acute metabolic encephalopathy Active Problems:   Hallucinations   Urinary tract infection   Hypertension   Prolonged QT interval   Polymyalgia rheumatica syndrome (HCC)   Dementia with behavioral disturbance (HCC)   History of Steroid-induced psychosis, with hallucinations (HCC)   Diabetes mellitus (HCC)   CAD (coronary artery disease)   Chronic diastolic CHF (congestive heart failure) (HCC)   COPD (chronic obstructive pulmonary disease) (HCC)   Atrial fibrillation (HCC)   Delirium   Visual hallucination Acute metabolic encephalopathy and delirium Background history of dementia with behavioral disturbance: Previous history of psychiatric illness: Initially presented with a basal oxygenation, but her history of dementia previous psychiatric illness.  Found to have acute UTI contributing to her presentation seen by psychiatry Depakote added overall some improvement interacting some.  Continue with supportive care PT OT fall precaution delirium  precaution.  Continue medication to keep for calm at the same time avoiding oversedation, palliative care input appreciated continue Depakote, folic acid, Neurontin bedtime Ativan 0.25 mg daily and 0.5 bedtime.  Holding Aricept and Namenda due to prolonged QTc.  Psych/Geri psych was consulted. She is yelling most of the time and at times sleeping.  E. coli sepsis due to UTI: Patient completed antibiotics  Febrile episode on 12/28 and 11/06/22: Ordered labs shows elevated WBC count but Pro-Cal is stable, BP stable.  Will obtain UA and chest x-ray.  Wonder if it is related to her PMR, also immunosuppressive. Reviewed chest x-ray-no acute finding, BMP stable.  If he has recurrent fever obtain respiratory virus panel  Hypertension BP stable on 2.5 mg amlodipine, metoprolol 25 twice daily. Prolonged QTc monitor minimize QT prolonging medication monitor electrolytes  PMR History of steroid-induced psychosis : Continue methotrexate.Patient has history of steroid induced psychosis currently not on steroids.  Diabetes mellitus blood sugar is stable continue SSI. Recent Labs  Lab 11/05/22 0854 11/05/22 1150 11/05/22 1709 11/05/22 2146 11/06/22 0812  GLUCAP 166* 144* 203* 147* 192*     CAD: Continue her aspirin 81, Lipitor, metoprolol  Atrial fibrillation:Rate controlled with metoprolol, not on anticoagulation due to recurrent falls COPD: Currently not wheezing continue bronchodilators as needed.  HLD continue statin Chronic diastolic CHF: s/p 1 dose of Lasix previously, BNP is normal.  Monitor Mild normocytic anemia:Monitor.   DVT prophylaxis: enoxaparin (LOVENOX) injection 40 mg Start: 10/25/22 1000 Code Status:   Code Status: DNR Family Communication: plan of care discussed with patient at bedside. Patient status is: Inpatient because of deconditioning debility fever Level of care: Med-Surg   Dispo: The patient is from: home  Anticipated disposition: SNF in 1-2 days if  afebrile  Objective: Vitals last 24 hrs: Vitals:   11/05/22 1707 11/05/22 2124 11/06/22 0610 11/06/22 0811  BP: (!) 127/55 (!) 124/53 (!) 137/91 117/69  Pulse: 100 98 (!) 105 (!) 101  Resp: 18 16 17 17   Temp: 98.1 F (36.7 C) 99 F (37.2 C) (!) 101 F (38.3 C) 99.3 F (37.4 C)  TempSrc:      SpO2: (!) 89% 94% 92% 93%  Weight:      Height:       Weight change:   Physical Examination: General exam: alert awake, mildly interactive, elderly frail appearing HEENT:Oral mucosa moist, Ear/Nose WNL grossly Respiratory system: bilaterally clear BS, no use of accessory muscle Cardiovascular system: S1 & S2 +, No JVD. Gastrointestinal system: Abdomen soft,NT,ND, BS+ Nervous System:Alert, awake, moving extremities. Extremities: LE edema neg,distal peripheral pulses palpable.  Skin: No rashes,no icterus. MSK: Normal muscle bulk,tone, power  Medications reviewed:  Scheduled Meds:  amLODipine  2.5 mg Oral Daily   aspirin  81 mg Oral Daily   atorvastatin  40 mg Oral Daily   Chlorhexidine Gluconate Cloth  6 each Topical Daily   divalproex  125 mg Oral Q12H   enoxaparin (LOVENOX) injection  40 mg Subcutaneous Q24H   folic acid  1 mg Oral Daily   gabapentin  100 mg Oral QHS   insulin aspart  0-15 Units Subcutaneous TID WC   insulin aspart  0-5 Units Subcutaneous QHS   LORazepam  0.25 mg Oral Daily   LORazepam  0.5 mg Oral QHS   methotrexate  12.5 mg Oral Q Sat   metoprolol tartrate  25 mg Oral BID   polyethylene glycol  17 g Oral Daily   Continuous Infusions:    Diet Order             Diet heart healthy/carb modified Room service appropriate? Yes; Fluid consistency: Thin  Diet effective now                  Intake/Output Summary (Last 24 hours) at 11/06/2022 0909 Last data filed at 11/05/2022 1859 Gross per 24 hour  Intake 0 ml  Output 500 ml  Net -500 ml   Net IO Since Admission: -3,657.13 mL [11/06/22 0909]  Wt Readings from Last 3 Encounters:  10/24/22 63 kg   10/18/22 63.1 kg  09/02/22 62.4 kg     Unresulted Labs (From admission, onward)     Start     Ordered   11/06/22 0759  Urinalysis, Routine w reflex microscopic Urine, Catheterized  Once,   R        11/06/22 0759          Data Reviewed: I have personally reviewed following labs and imaging studies CBC: Recent Labs  Lab 11/01/22 0528 11/06/22 0843  WBC 10.9* 20.3*  HGB 11.8* 12.0  HCT 36.3 37.4  MCV 91.9 93.3  PLT 450* 424*   Basic Metabolic Panel: Recent Labs  Lab 10/31/22 0518 11/06/22 0410  NA 142 144  K 3.5 3.6  CL 102 101  CO2 28 30  GLUCOSE 139* 199*  BUN 33* 31*  CREATININE 0.73 0.86  CALCIUM 9.4 9.6   GFR: CBG: Recent Labs  Lab 11/05/22 0854 11/05/22 1150 11/05/22 1709 11/05/22 2146 11/06/22 0812  GLUCAP 166* 144* 203* 147* 192*  Sepsis Labs: Recent Labs  Lab 10/31/22 0518  PROCALCITON 0.10    Recent Results (from the past 240 hour(s))  Culture, blood (Routine  X 2) w Reflex to ID Panel     Status: None   Collection Time: 10/27/22  5:26 PM   Specimen: BLOOD  Result Value Ref Range Status   Specimen Description BLOOD BLOOD LEFT WRIST  Final   Special Requests   Final    BOTTLES DRAWN AEROBIC AND ANAEROBIC Blood Culture adequate volume   Culture   Final    NO GROWTH 5 DAYS Performed at [facility], [address] [place], [place] [address]    Report Status 11/01/2022 FINAL  Final  Culture, blood (Routine X 2) w Reflex to ID Panel     Status: None   Collection Time: 10/27/22  5:37 PM   Specimen: BLOOD  Result Value Ref Range Status   Specimen Description BLOOD BLOOD RIGHT HAND  Final   Special Requests   Final    BOTTLES DRAWN AEROBIC AND ANAEROBIC Blood Culture adequate volume   Culture   Final    NO GROWTH 5 DAYS Performed at [facility], [address] [place], [place] [address]    Report Status 11/01/2022 FINAL  Final    Antimicrobials: Anti-infectives (From admission, onward)    Start     Dose/Rate  Route Frequency Ordered Stop   10/25/22 0100  cefTRIAXone (ROCEPHIN) 1 g in sodium chloride 0.9 % 100 mL IVPB        1 g 200 mL/hr over 30 Minutes Intravenous Every 24 hours 10/25/22 0054 10/29/22 0123   10/25/22 0015  cefTRIAXone (ROCEPHIN) 1 g in sodium chloride 0.9 % 100 mL IVPB  Status:  Discontinued        1 g 200 mL/hr over 30 Minutes Intravenous  Once 10/25/22 0008 10/25/22 0057      Culture/Microbiology    Component Value Date/Time   SDES BLOOD BLOOD RIGHT HAND 10/27/2022 1737   SPECREQUEST  10/27/2022 1737    BOTTLES DRAWN AEROBIC AND ANAEROBIC Blood Culture adequate volume   CULT  10/27/2022 1737    NO GROWTH 5 DAYS Performed at [facility], [address], [place],  [address]    REPTSTATUS 11/01/2022 FINAL 10/27/2022 1737  Radiology Studies: No results found.  LOS: 12 days  Antonieta Pert, MD [facility]  11/06/2022, 9:09 AM

## 2022-11-07 DIAGNOSIS — G9341 Metabolic encephalopathy: Secondary | ICD-10-CM | POA: Diagnosis not present

## 2022-11-07 LAB — CBC
HCT: 34.5 % — ABNORMAL LOW (ref 36.0–46.0)
Hemoglobin: 11.1 g/dL — ABNORMAL LOW (ref 12.0–15.0)
MCH: 30.2 pg (ref 26.0–34.0)
MCHC: 32.2 g/dL (ref 30.0–36.0)
MCV: 93.8 fL (ref 80.0–100.0)
Platelets: 429 10*3/uL — ABNORMAL HIGH (ref 150–400)
RBC: 3.68 MIL/uL — ABNORMAL LOW (ref 3.87–5.11)
RDW: 15.6 % — ABNORMAL HIGH (ref 11.5–15.5)
WBC: 16.4 10*3/uL — ABNORMAL HIGH (ref 4.0–10.5)
nRBC: 0 % (ref 0.0–0.2)

## 2022-11-07 LAB — BASIC METABOLIC PANEL
Anion gap: 12 (ref 5–15)
BUN: 37 mg/dL — ABNORMAL HIGH (ref 8–23)
CO2: 31 mmol/L (ref 22–32)
Calcium: 9.1 mg/dL (ref 8.9–10.3)
Chloride: 101 mmol/L (ref 98–111)
Creatinine, Ser: 0.85 mg/dL (ref 0.44–1.00)
GFR, Estimated: >60 mL/min (ref >60)
Glucose, Bld: 175 mg/dL — ABNORMAL HIGH (ref 70–99)
Potassium: 3.5 mmol/L (ref 3.5–5.1)
Sodium: 144 mmol/L (ref 135–145)

## 2022-11-07 LAB — GLUCOSE, CAPILLARY
Glucose-Capillary: 176 mg/dL — ABNORMAL HIGH (ref 70–99)
Glucose-Capillary: 142 mg/dL — ABNORMAL HIGH (ref 70–99)
Glucose-Capillary: 172 mg/dL — ABNORMAL HIGH (ref 70–99)
Glucose-Capillary: 150 mg/dL — ABNORMAL HIGH (ref 70–99)

## 2022-11-07 LAB — PROCALCITONIN: Procalcitonin: <0.1 ng/mL

## 2022-11-07 NOTE — Progress Notes (Signed)
Mobility Specialist - Progress Note   11/07/22 1200  Mobility  Activity Transferred from bed to chair  Level of Assistance Dependent, patient does less than 25%  Assistive Device None (slide transfer)  Distance Ambulated (ft) 0 ft  Activity Response Tolerated well  $Mobility charge 1 Mobility     Pt lying in bed upon arrival, utilizing RA. Pt lethargic, but does open eyes and verbally responsive.  Able to come EOB with maxA via log roll. Does voice pain in side. Transferred bed-chair with slide transfer. Alarm set, needs in reach.    Kathee Delton Mobility Specialist 11/07/22, 12:03 PM

## 2022-11-07 NOTE — Progress Notes (Signed)
PROGRESS NOTE Tanya Cole  WUJ:[id number] DOB: Sep 04, 1933 DOA: 10/24/2022 PCP: Dale Magna, MD   Brief Narrative/Hospital Course: 89 year old female with past medical history of diastolic CHF, VRE UTI previously, polymyalgia rheumatica, paroxysmal atrial fibrillation and senile dementia brought into the emergency room on 12/25 for visual hallucinations. Workup revealed urinary tract infection and patient brought in for further evaluation. Infection treated and patient felt to be back at baseline. Palliative care and discussion with family about disposition some family members feel he cannot care for her at home any longer. Plan is for patient to go to skilled nursing with palliative care following .  1/8: Hemodynamically stable.  PT is recommending SNF. Completed a course of antibiotic.  Patient did had a febrile episode early morning on 1/7, remained afebrile since then.  Labs with some improvement of leukocytosis to 16.4, procalcitonin improving and now negative. BMP stable.  Repeat chest x-ray done yesterday with no active disease. It was thought to be related to her PMR.  Patient had an history of steroid-induced psychosis and is currently on methotrexate for PMR   Subjective: Patient was seen and examined today.  No new concern.   Assessment and Plan: Principal Problem:   Acute metabolic encephalopathy Active Problems:   Hallucinations   Urinary tract infection   Hypertension   Prolonged QT interval   Polymyalgia rheumatica syndrome (HCC)   Dementia with behavioral disturbance (HCC)   History of Steroid-induced psychosis, with hallucinations (HCC)   Diabetes mellitus (HCC)   CAD (coronary artery disease)   Chronic diastolic CHF (congestive heart failure) (HCC)   COPD (chronic obstructive pulmonary disease) (HCC)   Atrial fibrillation (HCC)   Delirium   Visual hallucination Acute metabolic encephalopathy and delirium Background history of dementia with behavioral  disturbance: Previous history of psychiatric illness: Initially presented with a basal oxygenation, but her history of dementia previous psychiatric illness.  Found to have acute UTI contributing to her presentation seen by psychiatry Depakote added overall some improvement interacting some.  Continue with supportive care PT OT fall precaution delirium precaution.  Continue medication to keep for calm at the same time avoiding oversedation, palliative care input appreciated continue Depakote, folic acid, Neurontin bedtime Ativan 0.25 mg daily and 0.5 bedtime.  Holding Aricept and Namenda due to prolonged QTc.  Psych/Geri psych was consulted. She is yelling most of the time and at times sleeping.  E. coli sepsis due to UTI: Patient completed antibiotics  Febrile episode on 12/28 and 11/06/22: Ordered labs shows elevated WBC count but Pro-Cal is stable, BP stable.  Will obtain UA and chest x-ray.  Wonder if it is related to her PMR, also immunosuppressive. Reviewed chest x-ray-no acute finding, BMP stable.  If he has recurrent fever obtain respiratory virus panel  Hypertension BP stable on 2.5 mg amlodipine, metoprolol 25 twice daily. Prolonged QTc monitor minimize QT prolonging medication monitor electrolytes  PMR History of steroid-induced psychosis : Continue methotrexate.Patient has history of steroid induced psychosis currently not on steroids.  Diabetes mellitus blood sugar is stable continue SSI. Recent Labs  Lab 11/06/22 1142 11/06/22 1610 11/06/22 2011 11/07/22 0908 11/07/22 1255  GLUCAP 194* 225* 240* 176* 142*      CAD: Continue her aspirin 81, Lipitor, metoprolol  Atrial fibrillation:Rate controlled with metoprolol, not on anticoagulation due to recurrent falls COPD: Currently not wheezing continue bronchodilators as needed.  HLD continue statin Chronic diastolic CHF: s/p 1 dose of Lasix previously, BNP is normal.  Monitor Mild normocytic anemia:Monitor.   DVT  prophylaxis:  enoxaparin (LOVENOX) injection 40 mg Start: 10/25/22 1000 Code Status:   Code Status: DNR Family Communication:   Patient status is: Inpatient because of deconditioning debility fever Level of care: Med-Surg   Dispo: The patient is from: home            Anticipated disposition: SNF in 1-2 days if afebrile  Objective: Vitals last 24 hrs: Vitals:   11/06/22 2019 11/07/22 0451 11/07/22 0908 11/07/22 1529  BP: 123/61 138/61 121/68 118/74  Pulse: 92 87 92 85  Resp: 18 18 20 17   Temp: 98.4 F (36.9 C) 98 F (36.7 C) 99.1 F (37.3 C) 97.8 F (36.6 C)  TempSrc: Oral Oral    SpO2: 96% 93% 91% 94%  Weight:      Height:       Weight change:   Physical Examination: General.  Frail elderly lady, in no acute distress. Pulmonary.  Lungs clear bilaterally, normal respiratory effort. CV.  Regular rate and rhythm, no JVD, rub or murmur. Abdomen.  Soft, nontender, nondistended, BS positive. CNS.  Somnolent, no apparent focal deficit Extremities.  No edema, no cyanosis, pulses intact and symmetrical. Psychiatry.  Judgment and insight appears impaired.  Medications reviewed:  Scheduled Meds:  amLODipine  2.5 mg Oral Daily   aspirin  81 mg Oral Daily   atorvastatin  40 mg Oral Daily   Chlorhexidine Gluconate Cloth  6 each Topical Daily   divalproex  125 mg Oral Q12H   enoxaparin (LOVENOX) injection  40 mg Subcutaneous B28U   folic acid  1 mg Oral Daily   gabapentin  100 mg Oral QHS   insulin aspart  0-15 Units Subcutaneous TID WC   insulin aspart  0-5 Units Subcutaneous QHS   LORazepam  0.25 mg Oral Daily   LORazepam  0.5 mg Oral QHS   methotrexate  12.5 mg Oral Q Sat   metoprolol tartrate  25 mg Oral BID   polyethylene glycol  17 g Oral Daily   Continuous Infusions:    Diet Order             Diet heart healthy/carb modified Room service appropriate? Yes; Fluid consistency: Thin  Diet effective now                  Intake/Output Summary (Last 24 hours) at 11/07/2022  1649 Last data filed at 11/07/2022 0121 Gross per 24 hour  Intake 60 ml  Output 550 ml  Net -490 ml    Net IO Since Admission: -3,907.13 mL [11/07/22 1649]  Wt Readings from Last 3 Encounters:  10/24/22 63 kg  10/18/22 63.1 kg  09/02/22 62.4 kg     Unresulted Labs (From admission, onward)     Start     Ordered   11/07/22 0500  Procalcitonin  Daily,   R     Question:  Specimen collection method  Answer:  Lab=Lab collect   11/06/22 0911   11/06/22 0759  Urinalysis, Routine w reflex microscopic Urine, Catheterized  Once,   R        11/06/22 0759          Data Reviewed: I have personally reviewed following labs and imaging studies CBC: Recent Labs  Lab 11/01/22 0528 11/06/22 0843 11/07/22 0459  WBC 10.9* 20.3* 16.4*  HGB 11.8* 12.0 11.1*  HCT 36.3 37.4 34.5*  MCV 91.9 93.3 93.8  PLT 450* 424* 429*    Basic Metabolic Panel: Recent Labs  Lab 11/06/22 0410 11/07/22 0459  NA 144 144  K 3.6 3.5  CL 101 101  CO2 30 31  GLUCOSE 199* 175*  BUN 31* 37*  CREATININE 0.86 0.85  CALCIUM 9.6 9.1    GFR: CBG: Recent Labs  Lab 11/06/22 1142 11/06/22 1610 11/06/22 2011 11/07/22 0908 11/07/22 1255  GLUCAP 194* 225* 240* 176* 142*   Sepsis Labs: Recent Labs  Lab 11/06/22 0407 11/07/22 0459  PROCALCITON 0.13 <0.10     No results found for this or any previous visit (from the past 240 hour(s)).   Antimicrobials: Anti-infectives (From admission, onward)    Start     Dose/Rate Route Frequency Ordered Stop   10/25/22 0100  cefTRIAXone (ROCEPHIN) 1 g in sodium chloride 0.9 % 100 mL IVPB        1 g 200 mL/hr over 30 Minutes Intravenous Every 24 hours 10/25/22 0054 10/29/22 0123   10/25/22 0015  cefTRIAXone (ROCEPHIN) 1 g in sodium chloride 0.9 % 100 mL IVPB  Status:  Discontinued        1 g 200 mL/hr over 30 Minutes Intravenous  Once 10/25/22 0008 10/25/22 0057      Culture/Microbiology    Component Value Date/Time   SDES BLOOD BLOOD RIGHT HAND  10/27/2022 1737   SPECREQUEST  10/27/2022 1737    BOTTLES DRAWN AEROBIC AND ANAEROBIC Blood Culture adequate volume   CULT  10/27/2022 1737    NO GROWTH 5 DAYS Performed at [facility], [address] [place], [place] [address]    REPTSTATUS 11/01/2022 FINAL 10/27/2022 1737  Radiology Studies: [facility] Chest Port 1 View  Result Date: 11/06/2022 CLINICAL DATA:  Fever EXAM: PORTABLE CHEST 1 VIEW COMPARISON:  October 27, 2022 FINDINGS: No pneumothorax. The cardiomediastinal silhouette is stable. No focal infiltrate or overt edema. No other acute abnormalities. No cause for fever identified. IMPRESSION: No active disease. Electronically Signed   By: Dorise Bullion III M.D.   On: 11/06/2022 12:33    LOS: 13 days  Lorella Nimrod, MD [facility]  11/07/2022, 4:49 PM

## 2022-11-07 NOTE — Progress Notes (Signed)
Physical Therapy Treatment Patient Details Name: Tanya Cole MRN: [id number] DOB: November 24, 1932 Today's Date: 11/07/2022   History of Present Illness 89 y.o. female with medical history significant for DM, HTN, diastolic CHF, CAD, polymyalgia rheumatica, paroxysmal atrial fibrillation not on anticoagulation due to high fall risk, dementia who was brought to the ED with confusion x 1 to 2 days beyond her baseline associated with visual hallucinations.  Workup thus far has been unremarkable except for abnormal urinalysis.  She was also found to have QTc prolongation hence psychogenic medications are limited.    PT Comments    Pt seen for PT tx with pt received in bed, leaning to L, reporting she wasn't feeling good but unable to elaborate. Pt's lunch tray on R side of pt, untouched, unsure pt is aware of it being in room. Pt requires max assist for bed mobility with hospital bed features but does participate in some movement with MAX multimodal cuing. Pt is able to transfer STS from EOB x 2 with max assist with cuing/assistance for hand placement on RW & assistance to power up. Pt expresses fear of falling with standing attempts with PT providing education/reassurance. Pt unable to advance feet in standing, so assisted pt to recliner via squat pivot max/total assist with cuing for hand placement. Assisted pt with lunch tray set up & locating sandwich & drink on tray as pt reports impaired vision. Nursing staff made aware of pt's need for assistance to consume meals.    Recommendations for follow up therapy are one component of a multi-disciplinary discharge planning process, led by the attending physician.  Recommendations may be updated based on patient status, additional functional criteria and insurance authorization.  Follow Up Recommendations  Skilled nursing-short term rehab (<3 hours/day) Can patient physically be transported by private vehicle: No   Assistance Recommended at Discharge  Frequent or constant Supervision/Assistance  Patient can return home with the following A lot of help with bathing/dressing/bathroom;Two people to help with walking and/or transfers;Assistance with cooking/housework;Assistance with feeding;Direct supervision/assist for medications management;Assist for transportation;Help with stairs or ramp for entrance   Equipment Recommendations  None recommended by PT    Recommendations for Other Services       Precautions / Restrictions Precautions Precautions: Fall Restrictions Weight Bearing Restrictions: No     Mobility  Bed Mobility Overal bed mobility: Needs Assistance Bed Mobility: Supine to Sit     Supine to sit: Max assist     General bed mobility comments: Pt requires cuing but somewhat initiates moving BLE towards EOB, holds to bed rails/PTs hands after PT assists with grabbing rail to assist with supine>sit.    Transfers Overall transfer level: Needs assistance Equipment used: Rolling walker (2 wheels) Transfers: Sit to/from Stand Sit to Stand: Max assist (Pt requires MAX cuing & assistance to place hands on RW, max assist for STS from EOB x 2 with pt able to clear buttocks but does not shift pelvis entirely forward, nor come to full upright standing.)     Squat pivot transfers: Max assist, Total assist (PT provides assistance for hand placement & sequencing, pt somewhat assists in clearing buttocks from EOB.)          Ambulation/Gait                   Stairs             Wheelchair Mobility    Modified Rankin (Stroke Patients Only)       Balance Overall balance  assessment: Needs assistance Sitting-balance support: Feet unsupported, Bilateral upper extremity supported Sitting balance-Leahy Scale: Poor                                      Cognition Arousal/Alertness: Awake/alert, Lethargic Behavior During Therapy: Anxious Overall Cognitive Status: History of cognitive  impairments - at baseline Area of Impairment: Orientation, Memory, Following commands, Awareness, Safety/judgement, Attention, Problem solving                 Orientation Level: Disoriented to, Place, Time, Situation, Person (aware of name but unaware of age (reports she's 89 y/o) nor birthday, reports she feels like she is going to quit working & go back home with her mother)   Memory: Decreased recall of precautions, Decreased short-term memory Following Commands: Follows one step commands inconsistently, Follows one step commands with increased time Safety/Judgement: Decreased awareness of deficits, Decreased awareness of safety Awareness: Intellectual Problem Solving: Slow processing, Decreased initiation, Difficulty sequencing, Requires verbal cues, Requires tactile cues General Comments: MAX multimodal cuing throughout session, pt also limited by impaired vision        Exercises      General Comments        Pertinent Vitals/Pain Pain Assessment Pain Assessment: Faces Faces Pain Scale: No hurt    Home Living                          Prior Function            PT Goals (current goals can now be found in the care plan section) Acute Rehab PT Goals Patient Stated Goal: [facility]. PT Goal Formulation: Patient unable to participate in goal setting Time For Goal Achievement: 11/10/22 Potential to Achieve Goals: Fair Progress towards PT goals: Progressing toward goals    Frequency    Min 2X/week      PT Plan Current plan remains appropriate    Co-evaluation              AM-PAC PT "6 Clicks" Mobility   Outcome Measure  Help needed turning from your back to your side while in a flat bed without using bedrails?: A Lot Help needed moving from lying on your back to sitting on the side of a flat bed without using bedrails?: Total Help needed moving to and from a bed to a chair (including a wheelchair)?:  Total Help needed standing up from a chair using your arms (e.g., wheelchair or bedside chair)?: Total Help needed to walk in hospital room?: Total Help needed climbing 3-5 steps with a railing? : Total 6 Click Score: 7    End of Session Equipment Utilized During Treatment: Gait belt Activity Tolerance: Patient tolerated treatment well Patient left: in chair;with chair alarm set;with call bell/phone within reach Nurse Communication: Mobility status PT Visit Diagnosis: Other abnormalities of gait and mobility (R26.89);Muscle weakness (generalized) (M62.81);Difficulty in walking, not elsewhere classified (R26.2)     Time: 7412-8786 PT Time Calculation (min) (ACUTE ONLY): 14 min  Charges:  $Therapeutic Activity: 8-22 mins                     Aleda Grana, PT, DPT 11/07/22, 4:59 PM   Sandi Mariscal 11/07/2022, 4:57 PM

## 2022-11-07 NOTE — Progress Notes (Signed)
CSW spoke with pts dtr, Pam, to let her know that the 2 facilities that she wanted for SNF have declined bed offers.  Pam was in agreement with referrals being sent to other locations. She states that pt needs LTC and [facility] mentioned that they could assist with app for LTC.  CSW also made referral to [facility]

## 2022-11-08 DIAGNOSIS — G9341 Metabolic encephalopathy: Secondary | ICD-10-CM | POA: Diagnosis not present

## 2022-11-08 LAB — GLUCOSE, CAPILLARY
Glucose-Capillary: 140 mg/dL — ABNORMAL HIGH (ref 70–99)
Glucose-Capillary: 122 mg/dL — ABNORMAL HIGH (ref 70–99)
Glucose-Capillary: 130 mg/dL — ABNORMAL HIGH (ref 70–99)
Glucose-Capillary: 125 mg/dL — ABNORMAL HIGH (ref 70–99)
Glucose-Capillary: 117 mg/dL — ABNORMAL HIGH (ref 70–99)

## 2022-11-08 LAB — PROCALCITONIN: Procalcitonin: <0.1 ng/mL

## 2022-11-08 MED ORDER — LACTATED RINGERS IV BOLUS
500.0000 mL | Freq: Once | INTRAVENOUS | Status: AC
  Administered 2022-11-08: 500 mL via INTRAVENOUS

## 2022-11-08 NOTE — Progress Notes (Signed)
       CROSS COVER NOTE  NAME: Tanya Cole MRN: [id number] DOB : 1933-02-19 ATTENDING PHYSICIAN: Lorella Nimrod, MD    Date of Service   11/08/2022   HPI/Events of Note   Message received from RN reporting no urinary output since foley removed at 1830 yesterday evening. Bladdder scan shows 93 mL.  Interventions   Assessment/Plan:  LR bolus      To reach the provider On-Call:   7AM- 7PM see care teams to locate the attending and reach out to them via [email or web address]. 7PM-7AM contact night-coverage If you still have difficulty reaching the appropriate provider, please page the [facility] (Director on Call) for [facility] on amion for assistance  This document was prepared using Set designer software and may include unintentional dictation errors.  Neomia Glass DNP, MBA, FNP-BC Nurse Practitioner [facility] Pager [phone]

## 2022-11-08 NOTE — Progress Notes (Signed)
Mobility Specialist - Progress Note   11/08/22 1100  Mobility  Activity Transferred from bed to chair (alarm set, needs in reach)  Level of Assistance Dependent, patient does less than 25%  Assistive Device Other (Comment) (slide transfer)  Activity Response Tolerated well  $Mobility charge 1 Mobility     Pt lying in bed utilizing RA. Lethargic but alert and agreeable to activity. Pt reports pain in neck and B shoulders. Assisted to recliner via slide transfer +2. Pt left in chair with alarm set, needs in reach.    Kathee Delton Mobility Specialist 11/08/22, 11:24 AM

## 2022-11-08 NOTE — Progress Notes (Signed)
Patient not void this shift, bladder scanned 93cc, hospitalist notified, 500cc bolus ordered, will continue to monitor.

## 2022-11-08 NOTE — Progress Notes (Signed)
PROGRESS NOTE Tanya Cole  ZOX:[id number] DOB: [birth year] DOA: 10/24/2022 PCP: Einar Pheasant, MD   Brief Narrative/Hospital Course: 89 year old female with past medical history of diastolic CHF, VRE UTI previously, polymyalgia rheumatica, paroxysmal atrial fibrillation and senile dementia brought into the emergency room on 12/25 for visual hallucinations. Workup revealed urinary tract infection and patient brought in for further evaluation. Infection treated and patient felt to be back at baseline. Palliative care and discussion with family about disposition some family members feel he cannot care for her at home any longer. Plan is for patient to go to skilled nursing with palliative care following .  1/8: Hemodynamically stable.  PT is recommending SNF. Completed a course of antibiotic.  Patient did had a febrile episode early morning on 1/7, remained afebrile since then.  Labs with some improvement of leukocytosis to 16.4, procalcitonin improving and now negative. BMP stable.  Repeat chest x-ray done yesterday with no active disease. It was thought to be related to her PMR.  Patient had an history of steroid-induced psychosis and is currently on methotrexate for PMR  1/9: Patient was eventually able to void this morning after removing Foley catheter yesterday late afternoon.  Received some IV fluid as there was not much volume noted on bladder scan initially.  Still no bed availability for SNF   Subjective: Patient was seen and examined today.  She was sitting comfortably in chair.  When asked about voiding, stating that she can urinate now but does not want to use pure wick. She was also tearful stating that she wants to go home.   Assessment and Plan: Principal Problem:   Acute metabolic encephalopathy Active Problems:   Hallucinations   Urinary tract infection   Hypertension   Prolonged QT interval   Polymyalgia rheumatica syndrome (HCC)   Dementia with behavioral  disturbance (HCC)   History of Steroid-induced psychosis, with hallucinations (HCC)   Diabetes mellitus (HCC)   CAD (coronary artery disease)   Chronic diastolic CHF (congestive heart failure) (HCC)   COPD (chronic obstructive pulmonary disease) (HCC)   Atrial fibrillation (HCC)   Delirium   Visual hallucination Acute metabolic encephalopathy and delirium Background history of dementia with behavioral disturbance: Previous history of psychiatric illness: Initially presented with a basal oxygenation, but her history of dementia previous psychiatric illness.  Found to have acute UTI contributing to her presentation seen by psychiatry Depakote added overall some improvement interacting some.  Continue with supportive care PT OT fall precaution delirium precaution.  Continue medication to keep for calm at the same time avoiding oversedation, palliative care input appreciated continue Depakote, folic acid, Neurontin bedtime Ativan 0.25 mg daily and 0.5 bedtime.  Holding Aricept and Namenda due to prolonged QTc.  Psych/Geri psych was consulted. She is yelling most of the time and at times sleeping.  E. coli sepsis due to UTI: Patient completed antibiotics  Febrile episode on 12/28 and 11/06/22: Ordered labs shows elevated WBC count but Pro-Cal is stable, BP stable.  Will obtain UA and chest x-ray.  Wonder if it is related to her PMR, also immunosuppressive. Reviewed chest x-ray-no acute finding, BMP stable.  If he has recurrent fever obtain respiratory virus panel  Hypertension BP stable on 2.5 mg amlodipine, metoprolol 25 twice daily. Prolonged QTc monitor minimize QT prolonging medication monitor electrolytes  PMR History of steroid-induced psychosis : Continue methotrexate.Patient has history of steroid induced psychosis currently not on steroids.  Diabetes mellitus blood sugar is stable continue SSI. Recent Labs  Lab 11/07/22  1715 11/07/22 2059 11/08/22 0742 11/08/22 1138 11/08/22 1542   GLUCAP 172* 150* 140* 122* 130*      CAD: Continue her aspirin 81, Lipitor, metoprolol  Atrial fibrillation:Rate controlled with metoprolol, not on anticoagulation due to recurrent falls COPD: Currently not wheezing continue bronchodilators as needed.  HLD continue statin Chronic diastolic CHF: s/p 1 dose of Lasix previously, BNP is normal.  Monitor Mild normocytic anemia:Monitor.   DVT prophylaxis: enoxaparin (LOVENOX) injection 40 mg Start: 10/25/22 1000 Code Status:   Code Status: DNR Family Communication:   Patient status is: Inpatient because of deconditioning debility fever Level of care: Med-Surg   Dispo: The patient is from: home            Anticipated disposition: SNF -medically stable but no bed availability yet.  Objective: Vitals last 24 hrs: Vitals:   11/07/22 2056 11/08/22 0558 11/08/22 0741 11/08/22 1540  BP: 122/69 (!) 127/56 120/60 (!) 129/48  Pulse: 87 74 82 66  Resp: 20 20 17 18   Temp: 99.1 F (37.3 C) 98.7 F (37.1 C) 98.9 F (37.2 C) 98.4 F (36.9 C)  TempSrc:  Oral Oral Oral  SpO2: 96% 95% 93% 96%  Weight:      Height:       Weight change:   Physical Examination: General.  Frail elderly lady, in no acute distress. Pulmonary.  Lungs clear bilaterally, normal respiratory effort. CV.  Regular rate and rhythm, no JVD, rub or murmur. Abdomen.  Soft, nontender, nondistended, BS positive. CNS.  Alert and oriented to self only.  No focal neurologic deficit. Extremities.  No edema, no cyanosis, pulses intact and symmetrical. Psychiatry.  Judgment and insight appears impaired  Medications reviewed:  Scheduled Meds:  amLODipine  2.5 mg Oral Daily   aspirin  81 mg Oral Daily   atorvastatin  40 mg Oral Daily   Chlorhexidine Gluconate Cloth  6 each Topical Daily   divalproex  125 mg Oral Q12H   enoxaparin (LOVENOX) injection  40 mg Subcutaneous Q24H   folic acid  1 mg Oral Daily   gabapentin  100 mg Oral QHS   insulin aspart  0-15 Units Subcutaneous  TID WC   insulin aspart  0-5 Units Subcutaneous QHS   LORazepam  0.25 mg Oral Daily   LORazepam  0.5 mg Oral QHS   methotrexate  12.5 mg Oral Q Sat   metoprolol tartrate  25 mg Oral BID   polyethylene glycol  17 g Oral Daily   Continuous Infusions:    Diet Order             Diet heart healthy/carb modified Room service appropriate? Yes; Fluid consistency: Thin  Diet effective now                  Intake/Output Summary (Last 24 hours) at 11/08/2022 1623 Last data filed at 11/08/2022 1233 Gross per 24 hour  Intake --  Output 550 ml  Net -550 ml    Net IO Since Admission: -4,457.13 mL [11/08/22 1623]  Wt Readings from Last 3 Encounters:  10/24/22 63 kg  10/18/22 63.1 kg  09/02/22 62.4 kg     Unresulted Labs (From admission, onward)     Start     Ordered   11/06/22 0759  Urinalysis, Routine w reflex microscopic Urine, Catheterized  Once,   R        11/06/22 0759          Data Reviewed: I have personally reviewed following labs and  imaging studies CBC: Recent Labs  Lab 11/06/22 0843 11/07/22 0459  WBC 20.3* 16.4*  HGB 12.0 11.1*  HCT 37.4 34.5*  MCV 93.3 93.8  PLT 424* 429*    Basic Metabolic Panel: Recent Labs  Lab 11/06/22 0410 11/07/22 0459  NA 144 144  K 3.6 3.5  CL 101 101  CO2 30 31  GLUCOSE 199* 175*  BUN 31* 37*  CREATININE 0.86 0.85  CALCIUM 9.6 9.1    GFR: CBG: Recent Labs  Lab 11/07/22 1715 11/07/22 2059 11/08/22 0742 11/08/22 1138 11/08/22 1542  GLUCAP 172* 150* 140* 122* 130*   Sepsis Labs: Recent Labs  Lab 11/06/22 0407 11/07/22 0459 11/08/22 0608  PROCALCITON 0.13 <0.10 <0.10     No results found for this or any previous visit (from the past 240 hour(s)).   Antimicrobials: Anti-infectives (From admission, onward)    Start     Dose/Rate Route Frequency Ordered Stop   10/25/22 0100  cefTRIAXone (ROCEPHIN) 1 g in sodium chloride 0.9 % 100 mL IVPB        1 g 200 mL/hr over 30 Minutes Intravenous Every 24 hours  10/25/22 0054 10/29/22 0123   10/25/22 0015  cefTRIAXone (ROCEPHIN) 1 g in sodium chloride 0.9 % 100 mL IVPB  Status:  Discontinued        1 g 200 mL/hr over 30 Minutes Intravenous  Once 10/25/22 0008 10/25/22 0057      Culture/Microbiology    Component Value Date/Time   SDES BLOOD BLOOD RIGHT HAND 10/27/2022 1737   SPECREQUEST  10/27/2022 1737    BOTTLES DRAWN AEROBIC AND ANAEROBIC Blood Culture adequate volume   CULT  10/27/2022 1737    NO GROWTH 5 DAYS Performed at [facility], [address] [place], [place] [address]    REPTSTATUS 11/01/2022 FINAL 10/27/2022 1737  Radiology Studies: No results found.  LOS: 14 days  Arnetha Courser, MD [facility]  11/08/2022, 4:23 PM

## 2022-11-09 DIAGNOSIS — G9341 Metabolic encephalopathy: Secondary | ICD-10-CM | POA: Diagnosis not present

## 2022-11-09 LAB — GLUCOSE, CAPILLARY
Glucose-Capillary: 139 mg/dL — ABNORMAL HIGH (ref 70–99)
Glucose-Capillary: 130 mg/dL — ABNORMAL HIGH (ref 70–99)
Glucose-Capillary: 131 mg/dL — ABNORMAL HIGH (ref 70–99)
Glucose-Capillary: 125 mg/dL — ABNORMAL HIGH (ref 70–99)

## 2022-11-09 NOTE — Progress Notes (Signed)
PROGRESS NOTE Tanya Cole  HQI:[id number] DOB: [birth year] DOA: 10/24/2022 PCP: Einar Pheasant, MD   Brief Narrative/Hospital Course: 89 year old female with past medical history of diastolic CHF, VRE UTI previously, polymyalgia rheumatica, paroxysmal atrial fibrillation and senile dementia brought into the emergency room on 12/25 for visual hallucinations. Workup revealed urinary tract infection and patient brought in for further evaluation. Infection treated and patient felt to be back at baseline. Palliative care and discussion with family about disposition some family members feel he cannot care for her at home any longer. Plan is for patient to go to skilled nursing with palliative care following .  1/8: Hemodynamically stable.  PT is recommending SNF. Completed a course of antibiotic.  Patient did had a febrile episode early morning on 1/7, remained afebrile since then.  Labs with some improvement of leukocytosis to 16.4, procalcitonin improving and now negative. BMP stable.  Repeat chest x-ray done yesterday with no active disease. It was thought to be related to her PMR.  Patient had an history of steroid-induced psychosis and is currently on methotrexate for PMR  1/9: Patient was eventually able to void this morning after removing Foley catheter yesterday late afternoon.  Received some IV fluid as there was not much volume noted on bladder scan initially.  Still no bed availability for SNF.  1/10: Patient remained medically stable with still no bed offer.  Able to void and eating well. She wants to go home.   Subjective: Patient was seen and examined today.  She was sitting in chair and crying stating that she wants to go home, saying that can you please help me to go to my home in [place].  Denies any pain.   Assessment and Plan: Principal Problem:   Acute metabolic encephalopathy Active Problems:   Hallucinations   Urinary tract infection   Hypertension   Prolonged QT  interval   Polymyalgia rheumatica syndrome (HCC)   Dementia with behavioral disturbance (HCC)   History of Steroid-induced psychosis, with hallucinations (HCC)   Diabetes mellitus (HCC)   CAD (coronary artery disease)   Chronic diastolic CHF (congestive heart failure) (HCC)   COPD (chronic obstructive pulmonary disease) (HCC)   Atrial fibrillation (HCC)   Delirium   Visual hallucination Acute metabolic encephalopathy and delirium Background history of dementia with behavioral disturbance: Previous history of psychiatric illness: Initially presented with a basal oxygenation, but her history of dementia previous psychiatric illness.  Found to have acute UTI contributing to her presentation seen by psychiatry Depakote added overall some improvement interacting some.  Continue with supportive care PT OT fall precaution delirium precaution.  Continue medication to keep for calm at the same time avoiding oversedation, palliative care input appreciated continue Depakote, folic acid, Neurontin bedtime Ativan 0.25 mg daily and 0.5 bedtime.  Holding Aricept and Namenda due to prolonged QTc.  Psych/G[facility] psych was consulted. She is yelling most of the time and at times sleeping.  E. coli sepsis due to UTI: Patient completed antibiotics  Febrile episode on 12/28 and 11/06/22: Ordered labs shows elevated WBC count but Pro-Cal is stable, BP stable.  Will obtain UA and chest x-ray.  Wonder if it is related to her PMR, also immunosuppressive. Reviewed chest x-ray-no acute finding, BMP stable.  If he has recurrent fever obtain respiratory virus panel  Hypertension BP stable on 2.5 mg amlodipine, metoprolol 25 twice daily. Prolonged QTc monitor minimize QT prolonging medication monitor electrolytes  PMR History of steroid-induced psychosis : Continue methotrexate.Patient has history of steroid induced  psychosis currently not on steroids.  Diabetes mellitus blood sugar is stable continue SSI. Recent Labs   Lab 11/08/22 2203 11/08/22 2236 11/09/22 0758 11/09/22 1147 11/09/22 1542  GLUCAP 125* 117* 139* 130* 131*      CAD: Continue her aspirin 81, Lipitor, metoprolol  Atrial fibrillation:Rate controlled with metoprolol, not on anticoagulation due to recurrent falls COPD: Currently not wheezing continue bronchodilators as needed.  HLD continue statin Chronic diastolic CHF: s/p 1 dose of Lasix previously, BNP is normal.  Monitor Mild normocytic anemia:Monitor.   DVT prophylaxis: enoxaparin (LOVENOX) injection 40 mg Start: 10/25/22 1000 Code Status:   Code Status: DNR Family Communication: Discussed with daughter on phone  Patient status is: Inpatient because of deconditioning debility fever Level of care: Med-Surg   Dispo: The patient is from: home            Anticipated disposition: SNF -medically stable but no bed availability yet.  Objective: Vitals last 24 hrs: Vitals:   11/09/22 0657 11/09/22 0750 11/09/22 1510 11/09/22 1540  BP: 116/60 (!) 124/59  (!) 109/47  Pulse: 70 64 63 62  Resp: 20 17  17   Temp: 97.8 F (36.6 C) 97.6 F (36.4 C)  98 F (36.7 C)  TempSrc:  Oral  Oral  SpO2: 98% 96% 99% 94%  Weight:      Height:       Weight change:   Physical Examination: General.  Frail elderly lady, in no acute distress. Pulmonary.  Lungs clear bilaterally, normal respiratory effort. CV.  Regular rate and rhythm, no JVD, rub or murmur. Abdomen.  Soft, nontender, nondistended, BS positive. CNS.  Alert and oriented .  No focal neurologic deficit. Extremities.  No edema, no cyanosis, pulses intact and symmetrical. Psychiatry.  Judgment and insight appears impaired.  Medications reviewed:  Scheduled Meds:  amLODipine  2.5 mg Oral Daily   aspirin  81 mg Oral Daily   atorvastatin  40 mg Oral Daily   Chlorhexidine Gluconate Cloth  6 each Topical Daily   divalproex  125 mg Oral Q12H   enoxaparin (LOVENOX) injection  40 mg Subcutaneous E83T   folic acid  1 mg Oral Daily    gabapentin  100 mg Oral QHS   insulin aspart  0-15 Units Subcutaneous TID WC   insulin aspart  0-5 Units Subcutaneous QHS   LORazepam  0.25 mg Oral Daily   LORazepam  0.5 mg Oral QHS   methotrexate  12.5 mg Oral Q Sat   metoprolol tartrate  25 mg Oral BID   polyethylene glycol  17 g Oral Daily   Continuous Infusions:    Diet Order             Diet heart healthy/carb modified Room service appropriate? No; Fluid consistency: Thin  Diet effective now                  Intake/Output Summary (Last 24 hours) at 11/09/2022 1552 Last data filed at 11/09/2022 1028 Gross per 24 hour  Intake 240 ml  Output --  Net 240 ml    Net IO Since Admission: -4,217.13 mL [11/09/22 1552]  Wt Readings from Last 3 Encounters:  10/24/22 63 kg  10/18/22 63.1 kg  09/02/22 62.4 kg     Unresulted Labs (From admission, onward)     Start     Ordered   11/06/22 0759  Urinalysis, Routine w reflex microscopic Urine, Catheterized  Once,   R        11/06/22 0759  Data Reviewed: I have personally reviewed following labs and imaging studies CBC: Recent Labs  Lab 11/06/22 0843 11/07/22 0459  WBC 20.3* 16.4*  HGB 12.0 11.1*  HCT 37.4 34.5*  MCV 93.3 93.8  PLT 424* 429*    Basic Metabolic Panel: Recent Labs  Lab 11/06/22 0410 11/07/22 0459  NA 144 144  K 3.6 3.5  CL 101 101  CO2 30 31  GLUCOSE 199* 175*  BUN 31* 37*  CREATININE 0.86 0.85  CALCIUM 9.6 9.1    GFR: CBG: Recent Labs  Lab 11/08/22 2203 11/08/22 2236 11/09/22 0758 11/09/22 1147 11/09/22 1542  GLUCAP 125* 117* 139* 130* 131*   Sepsis Labs: Recent Labs  Lab 11/06/22 0407 11/07/22 0459 11/08/22 0608  PROCALCITON 0.13 <0.10 <0.10     No results found for this or any previous visit (from the past 240 hour(s)).   Antimicrobials: Anti-infectives (From admission, onward)    Start     Dose/Rate Route Frequency Ordered Stop   10/25/22 0100  cefTRIAXone (ROCEPHIN) 1 g in sodium chloride 0.9 % 100 mL  IVPB        1 g 200 mL/hr over 30 Minutes Intravenous Every 24 hours 10/25/22 0054 10/29/22 0123   10/25/22 0015  cefTRIAXone (ROCEPHIN) 1 g in sodium chloride 0.9 % 100 mL IVPB  Status:  Discontinued        1 g 200 mL/hr over 30 Minutes Intravenous  Once 10/25/22 0008 10/25/22 0057      Culture/Microbiology    Component Value Date/Time   SDES BLOOD BLOOD RIGHT HAND 10/27/2022 1737   SPECREQUEST  10/27/2022 1737    BOTTLES DRAWN AEROBIC AND ANAEROBIC Blood Culture adequate volume   CULT  10/27/2022 1737    NO GROWTH 5 DAYS Performed at [facility], [address] [place], [place] [address]    REPTSTATUS 11/01/2022 FINAL 10/27/2022 1737  Radiology Studies: No results found.  LOS: 15 days  Arnetha Courser, MD [facility]  11/09/2022, 3:52 PM

## 2022-11-09 NOTE — TOC Progression Note (Signed)
Transition of Care [facility]) - Progression Note    Patient Details  Name: Tanya Cole MRN: [id number] Date of Birth: 1933/07/17  [facility]) CM/SW Contact  Quin Hoop, LCSW Phone Number: 11/09/2022, 11:03 AM  Clinical Narrative:    CSW followed up with Magda Paganini at [facility] to ask if she will take another look at patient to see if she can offer a bed.  CSW resent referral and will wait to hear back from [facility].  [facility] declined bed offer.   Expected Discharge Plan:  (waitiing on recs from team) Barriers to Discharge: Continued Medical Work up  Expected Discharge Plan and Services  SNF recommendation.                                                 Social Determinants of Health (SDOH) Interventions SDOH Screenings   Food Insecurity: No Food Insecurity (10/26/2022)  Housing: Low Risk  (10/26/2022)  Transportation Needs: No Transportation Needs (10/26/2022)  Utilities: Not At Risk (10/26/2022)  Alcohol Screen: Low Risk  (05/25/2022)  Depression (PHQ2-9): High Risk (10/18/2022)  Financial Resource Strain: Low Risk  (05/27/2022)  Physical Activity: Insufficiently Active (01/07/2019)  Social Connections: Moderately Isolated (05/27/2022)  Stress: No Stress Concern Present (05/27/2022)  Tobacco Use: Low Risk  (11/01/2022)    Readmission Risk Interventions    05/30/2022   10:28 AM 05/08/2022   10:08 AM 05/14/2021    9:26 AM  Readmission Risk Prevention Plan  Transportation Screening Complete Complete Complete  PCP or Specialist Appt within 5-7 Days   Complete  Home Care Screening   Complete  Medication Review (RN CM)   Referral to Pharmacy  Medication Review (RN Care Manager) Complete Complete   PCP or Specialist appointment within 3-5 days of discharge  Complete   HRI or Home Care Consult Complete Complete   SW Recovery Care/Counseling Consult Complete Complete   Palliative Care Screening Not Applicable Not Applicable   Skilled  Nursing Facility  Complete

## 2022-11-09 NOTE — Progress Notes (Signed)
Physical Therapy Treatment Patient Details Name: Tanya Cole MRN: [id number] DOB: 06-Apr-1933 Today's Date: 11/09/2022   History of Present Illness 89 y.o. female with medical history significant for DM, HTN, diastolic CHF, CAD, polymyalgia rheumatica, paroxysmal atrial fibrillation not on anticoagulation due to high fall risk, dementia who was brought to the ED with confusion x 1 to 2 days beyond her baseline associated with visual hallucinations.  Workup thus far has been unremarkable except for abnormal urinalysis.  She was also found to have QTc prolongation hence psychogenic medications are limited.    PT Comments    Pt seen for PT tx with pt crying out in bed, confused & reporting she's scared with PT providing encouragement/education. Pt is oriented to self (name & age) on this date, which is an improvement from last time this PT saw her. Pt is able to complete bed mobility with min assist, STS & stand pivot with mod/max assist with improving strength during task. Attempted to have pt perform BLE strengthening exercises but pt with poor sustained attention to task & ability to follow multimodal cuing. Pt does c/o dizziness throughout session that begins once pt sits EOB but vitals stable (HR 77 bpm, SpO2 95% on room air, BP in LUE 114/60 mmHg MAP 78). Will continue to follow pt acutely to address balance, strengthening, activity tolerance, transfers & gait as able.  PT reviewed PT goals & modified POC. PT goals downgraded 2/2 pt's slow progress & impaired cognition.     Recommendations for follow up therapy are one component of a multi-disciplinary discharge planning process, led by the attending physician.  Recommendations may be updated based on patient status, additional functional criteria and insurance authorization.  Follow Up Recommendations  Skilled nursing-short term rehab (<3 hours/day) Can patient physically be transported by private vehicle: No   Assistance Recommended  at Discharge Frequent or constant Supervision/Assistance  Patient can return home with the following A lot of help with bathing/dressing/bathroom;Assistance with cooking/housework;Assistance with feeding;Direct supervision/assist for medications management;Assist for transportation;Help with stairs or ramp for entrance;Two people to help with walking and/or transfers   Equipment Recommendations  None recommended by PT    Recommendations for Other Services       Precautions / Restrictions Precautions Precautions: Fall Restrictions Weight Bearing Restrictions: No     Mobility  Bed Mobility Overal bed mobility: Needs Assistance Bed Mobility: Supine to Sit Rolling: Min assist         General bed mobility comments: Cuing to initiate moving BLE to EOB, pt holds to PTs hands but is able to complete supine>sit with min assist, much improved compared to last time this PT saw pt.    Transfers Overall transfer level: Needs assistance Equipment used: 1 person hand held assist Transfers: Sit to/from Stand Sit to Stand: Mod assist   Step pivot transfers: Mod assist, Max assist       General transfer comment: Pt transfers STS from EOB & recliner with BUE HHA & mod/max assist with improved ability to clear buttocks but still unable to come to full uright standing. Pt able to clear buttocks and shuffle feet towards recliner during bed>chair transfer but still poor awareness of hand placement, sequencing & safety.    Ambulation/Gait                   Stairs             Wheelchair Mobility    Modified Rankin (Stroke Patients Only)  Balance Overall balance assessment: Needs assistance Sitting-balance support: Bilateral upper extremity supported, Feet supported Sitting balance-Leahy Scale: Poor Sitting balance - Comments: Pt with R lateral lean in sitting, appears unaware of this so unable to correct without cuing/assist. Postural control: Right lateral  lean Standing balance support: Bilateral upper extremity supported, During functional activity Standing balance-Leahy Scale: Zero                              Cognition Arousal/Alertness: Awake/alert Behavior During Therapy: Anxious Overall Cognitive Status: History of cognitive impairments - at baseline Area of Impairment: Orientation, Memory, Following commands, Awareness, Safety/judgement, Attention, Problem solving                 Orientation Level: Disoriented to, Place, Time, Situation (oriented to name & age on this date, which is an improvement compared to yesterday)   Memory: Decreased recall of precautions, Decreased short-term memory Following Commands: Follows one step commands inconsistently, Follows one step commands with increased time Safety/Judgement: Decreased awareness of deficits, Decreased awareness of safety Awareness: Intellectual Problem Solving: Slow processing, Decreased initiation, Difficulty sequencing, Requires verbal cues, Requires tactile cues General Comments: Pt crying out, reporting she's scared. PT attempting to provide reassurance. Pt asking to lay down when already in bed.        Exercises General Exercises - Lower Extremity Long Arc Quad: AROM, Strengthening, Right, Left, 10 reps, Seated    General Comments        Pertinent Vitals/Pain Pain Assessment Pain Assessment: Faces Faces Pain Scale: No hurt    Home Living                          Prior Function            PT Goals (current goals can now be found in the care plan section) Acute Rehab PT Goals Patient Stated Goal: pt unable to participate in goal setting PT Goal Formulation: Patient unable to participate in goal setting Time For Goal Achievement: 11/23/22 Potential to Achieve Goals: Fair Progress towards PT goals: Goals downgraded-see care plan    Frequency    Min 2X/week      PT Plan Current plan remains appropriate     Co-evaluation              AM-PAC PT "6 Clicks" Mobility   Outcome Measure  Help needed turning from your back to your side while in a flat bed without using bedrails?: A Lot Help needed moving from lying on your back to sitting on the side of a flat bed without using bedrails?: A Lot Help needed moving to and from a bed to a chair (including a wheelchair)?: A Lot Help needed standing up from a chair using your arms (e.g., wheelchair or bedside chair)?: Total Help needed to walk in hospital room?: Total Help needed climbing 3-5 steps with a railing? : Total 6 Click Score: 9    End of Session   Activity Tolerance: Patient tolerated treatment well Patient left: in chair;with call bell/phone within reach;with chair alarm set   PT Visit Diagnosis: Other abnormalities of gait and mobility (R26.89);Muscle weakness (generalized) (M62.81);Difficulty in walking, not elsewhere classified (R26.2)     Time: 1660-6301 PT Time Calculation (min) (ACUTE ONLY): 15 min  Charges:  $Therapeutic Activity: 8-22 mins  Lavone Nian, PT, DPT 11/09/22, 10:18 AM    Waunita Schooner 11/09/2022, 10:15 AM

## 2022-11-10 ENCOUNTER — Telehealth: Payer: Self-pay | Admitting: Cardiovascular Disease

## 2022-11-10 DIAGNOSIS — G9341 Metabolic encephalopathy: Secondary | ICD-10-CM | POA: Diagnosis not present

## 2022-11-10 LAB — GLUCOSE, CAPILLARY
Glucose-Capillary: 142 mg/dL — ABNORMAL HIGH (ref 70–99)
Glucose-Capillary: 176 mg/dL — ABNORMAL HIGH (ref 70–99)
Glucose-Capillary: 125 mg/dL — ABNORMAL HIGH (ref 70–99)
Glucose-Capillary: 133 mg/dL — ABNORMAL HIGH (ref 70–99)

## 2022-11-10 MED ORDER — ORAL CARE MOUTH RINSE
15.0000 mL | OROMUCOSAL | Status: DC
  Administered 2022-11-10 – 2022-11-14 (×13): 15 mL via OROMUCOSAL

## 2022-11-10 MED ORDER — ORAL CARE MOUTH RINSE: 15.0000 mL | OROMUCOSAL | Status: DC | PRN

## 2022-11-10 NOTE — NC FL2 (Signed)
[place] LEVEL OF CARE FORM     IDENTIFICATION  Patient Name: Tanya Cole: 1932/12/14 Sex: female Admission Date (Current Location): 10/24/2022  [place] and [place] Number:  Engineering geologist and Address:  [facility], [address] [place], [place] [address]      Provider Number: [id number]  Attending Physician Name and Address:  Lorella Nimrod, MD  Relative Name and Phone Number:  Olin Hauser [phone]    Current Level of Care: Hospital Recommended Level of Care: [place] Prior Approval Number:    Date Approved/Denied:   PASRR Number: [id number]  Discharge Plan: SNF    Current Diagnoses: Patient Active Problem List   Diagnosis Date Noted   Prolonged QT interval 10/25/2022   Dementia with behavioral disturbance ([place]) 10/25/2022   Delirium 10/25/2022   Breast lesion 10/19/2022   Nasal congestion 09/03/2022   Sleep difficulties 06/12/2022   Tenderness of left calf 06/08/2022   Abnormal urinalysis 05/30/2022   Community acquired pneumonia 05/29/2022   Discharge planning issues 05/22/2022   Atrial fibrillation ([place]) 05/19/2022   Hallucinations 05/06/2022   History of Steroid-induced psychosis, with hallucinations ([place]) 25/95/6387   Acute metabolic encephalopathy 56/43/3295   Paroxysmal atrial fibrillation with RVR ([place]) 04/02/2022   CAD (coronary artery disease) 01/25/2022   Polyarthritis 10/31/2021   COPD (chronic obstructive pulmonary disease) ([place]) 05/21/2021   Hypomagnesemia 05/14/2021   Hypokalemia    Acute exacerbation of chronic obstructive pulmonary disease (COPD) ([place]) 03/01/2021   Chronic diastolic CHF (congestive heart failure) ([place]) 11/29/2020   CAP (community acquired pneumonia) 11/29/2020   Abnormal CXR 10/19/2020   Type 2 diabetes mellitus with cardiac complication ([place]) 18/84/1660   COPD with acute exacerbation ([place]) 08/19/2020   Leukocytosis 08/07/2020   Iron  deficiency 04/16/2020   Anemia 04/04/2020   Coronary artery disease of native heart with stable angina pectoris ([place]) 12/29/2018   Memory change 05/27/2018   Osteoporosis 02/05/2018   Polymyalgia rheumatica syndrome ([place]) 05/16/2016   Urinary tract infection 07/07/2014   Diverticulitis 02/24/2013   Reactive airway disease 09/15/2012   Hypertension 09/14/2012   Hypercholesterolemia 09/14/2012   Diabetes mellitus ([place]) 09/14/2012    Orientation RESPIRATION BLADDER Height & Weight     Self  Normal Incontinent, External catheter Weight: 138 lb 14.2 oz (63 kg) Height:  5\' 1"  (154.9 cm)  BEHAVIORAL SYMPTOMS/MOOD NEUROLOGICAL BOWEL NUTRITION STATUS      Continent Diet (heart healthy, carb modified)  AMBULATORY STATUS COMMUNICATION OF NEEDS Skin   Limited Assist Verbally Other (Comment) (Erythemia/redness (abdomen))                       Personal Care Assistance Level of Assistance  Bathing, Feeding, Dressing Bathing Assistance: Limited assistance Feeding assistance: Limited assistance Dressing Assistance: Limited assistance     Functional Limitations Info  Sight, Hearing Sight Info: Impaired Hearing Info: Impaired      SPECIAL CARE FACTORS FREQUENCY  PT (By licensed PT), OT (By licensed OT)     PT Frequency: 5 x's/week OT Frequency: 5 x's/week            Contractures Contractures Info: Not present    Additional Factors Info  Code Status, Allergies Code Status Info: DNR Allergies Info: Tramadol           Current Medications (11/10/2022):  This is the current hospital active medication list Current Facility-Administered Medications  Medication Dose Route Frequency Provider Last Rate Last Admin   acetaminophen (TYLENOL)  tablet 650 mg  650 mg Oral Q6H PRN Athena Masse, MD   650 mg at 11/09/22 1334   Or   acetaminophen (TYLENOL) suppository 650 mg  650 mg Rectal Q6H PRN Athena Masse, MD       albuterol (PROVENTIL) (2.5 MG/3ML) 0.083% nebulizer solution  2.5 mg  2.5 mg Nebulization Q4H PRN Renda Rolls, RPH       amLODipine (NORVASC) tablet 2.5 mg  2.5 mg Oral Daily Judd Gaudier V, MD   2.5 mg at 11/10/22 0254   aspirin chewable tablet 81 mg  81 mg Oral Daily Annita Brod, MD   81 mg at 11/10/22 2706   atorvastatin (LIPITOR) tablet 40 mg  40 mg Oral Daily Athena Masse, MD   40 mg at 11/10/22 2376   Chlorhexidine Gluconate Cloth 2 % PADS 6 each  6 each Topical Daily Hosie Poisson, MD   6 each at 11/10/22 2831   diclofenac Sodium (VOLTAREN) 1 % topical gel 2 g  2 g Topical TID PRN Annita Brod, MD       divalproex (DEPAKOTE SPRINKLE) capsule 125 mg  125 mg Oral Q12H Patrecia Pour, NP   125 mg at 11/10/22 0923   enoxaparin (LOVENOX) injection 40 mg  40 mg Subcutaneous Q24H Judd Gaudier V, MD   40 mg at 51/76/16 0737   folic acid (FOLVITE) tablet 1 mg  1 mg Oral Daily Hosie Poisson, MD   1 mg at 11/10/22 1062   gabapentin (NEURONTIN) capsule 100 mg  100 mg Oral QHS Hosie Poisson, MD   100 mg at 11/09/22 2149   insulin aspart (novoLOG) injection 0-15 Units  0-15 Units Subcutaneous TID WC Athena Masse, MD   2 Units at 11/10/22 6948   insulin aspart (novoLOG) injection 0-5 Units  0-5 Units Subcutaneous QHS Athena Masse, MD   2 Units at 11/06/22 2059   LORazepam (ATIVAN) tablet 0.25 mg  0.25 mg Oral Daily Gevena Barre K, MD   0.25 mg at 11/10/22 5462   LORazepam (ATIVAN) tablet 0.25 mg  0.25 mg Oral Q6H PRN Annita Brod, MD   0.25 mg at 11/09/22 1334   LORazepam (ATIVAN) tablet 0.5 mg  0.5 mg Oral QHS Gevena Barre K, MD   0.5 mg at 11/09/22 2149   methotrexate (RHEUMATREX) tablet 12.5 mg  12.5 mg Oral Q Sat Renda Rolls, RPH   12.5 mg at 11/05/22 1218   metoprolol tartrate (LOPRESSOR) tablet 25 mg  25 mg Oral BID Athena Masse, MD   25 mg at 11/10/22 7035   nitroGLYCERIN (NITROSTAT) SL tablet 0.4 mg  0.4 mg Sublingual Q5 min PRN Athena Masse, MD       Oral care mouth rinse  15 mL Mouth Rinse 4 times per day  Lorella Nimrod, MD       Oral care mouth rinse  15 mL Mouth Rinse PRN Lorella Nimrod, MD       polyethylene glycol (MIRALAX / GLYCOLAX) packet 17 g  17 g Oral Daily Acheampong, Warnell Bureau, MD   17 g at 11/09/22 0902   ziprasidone (GEODON) injection 20 mg  20 mg Intramuscular Q6H PRN Enzo Bi, MD   20 mg at 11/07/22 0045     Discharge Medications: Please see discharge summary for a list of discharge medications.  Relevant Imaging Results:  Relevant Lab Results:   Additional Information SS#:[id number]  Tanya Hammans Merion Grimaldo, LCSW

## 2022-11-10 NOTE — Care Management Important Message (Signed)
Important Message  Patient Details  Name: Tanya Cole MRN: [id number] Date of Birth: [birth year]   Medicare Important Message Given:  Yes  I reviewed the Important Message from Medicare with the patient's Azzie Glatter, daughter by phone [phone]). She is in agreement with the discharge and I thanked her for time.   Juliann Pulse A Cosimo Schertzer 11/10/2022, 2:38 PM

## 2022-11-10 NOTE — Telephone Encounter (Signed)
Pt called stating pt is scheduled to move to a skilled nursing facility tomorrow and questioning if appointment scheduled for 11/16 needed to be cancelled. Nurse advised daughter to reach out to skilled nursing facility to inquiring if facility provides transportation.  Daughter verbalized understanding.

## 2022-11-10 NOTE — Plan of Care (Signed)

## 2022-11-10 NOTE — Progress Notes (Signed)
PROGRESS NOTE Tanya Cole  ZOX:[id number] DOB: 17-Sep-1933 DOA: 10/24/2022 PCP: Einar Pheasant, MD   Brief Narrative/Hospital Course: 89 year old female with past medical history of diastolic CHF, VRE UTI previously, polymyalgia rheumatica, paroxysmal atrial fibrillation and senile dementia brought into the emergency room on 12/25 for visual hallucinations. Workup revealed urinary tract infection and patient brought in for further evaluation. Infection treated and patient felt to be back at baseline. Palliative care and discussion with family about disposition some family members feel he cannot care for her at home any longer. Plan is for patient to go to skilled nursing with palliative care following .  1/8: Hemodynamically stable.  PT is recommending SNF. Completed a course of antibiotic.  Patient did had a febrile episode early morning on 1/7, remained afebrile since then.  Labs with some improvement of leukocytosis to 16.4, procalcitonin improving and now negative. BMP stable.  Repeat chest x-ray done yesterday with no active disease. It was thought to be related to her PMR.  Patient had an history of steroid-induced psychosis and is currently on methotrexate for PMR  1/9: Patient was eventually able to void this morning after removing Foley catheter yesterday late afternoon.  Received some IV fluid as there was not much volume noted on bladder scan initially.  Still no bed availability for SNF.  1/10: Patient remained medically stable with still no bed offer.  Able to void and eating well. She wants to go home.  1/11: Remained medically stable.  Awaiting SNF placement   Subjective: Patient was half hanging in chair when seen today and asking for help which was provided.  No new concern.   Assessment and Plan: Principal Problem:   Acute metabolic encephalopathy Active Problems:   Hallucinations   Urinary tract infection   Hypertension   Prolonged QT interval   Polymyalgia  rheumatica syndrome (HCC)   Dementia with behavioral disturbance (HCC)   History of Steroid-induced psychosis, with hallucinations (HCC)   Diabetes mellitus (HCC)   CAD (coronary artery disease)   Chronic diastolic CHF (congestive heart failure) (HCC)   COPD (chronic obstructive pulmonary disease) (HCC)   Atrial fibrillation (HCC)   Delirium   Visual hallucination Acute metabolic encephalopathy and delirium Background history of dementia with behavioral disturbance: Previous history of psychiatric illness: Initially presented with a basal oxygenation, but her history of dementia previous psychiatric illness.  Found to have acute UTI contributing to her presentation seen by psychiatry Depakote added overall some improvement interacting some.  Continue with supportive care PT OT fall precaution delirium precaution.  Continue medication to keep for calm at the same time avoiding oversedation, palliative care input appreciated continue Depakote, folic acid, Neurontin bedtime Ativan 0.25 mg daily and 0.5 bedtime.  Holding Aricept and Namenda due to prolonged QTc.  Psych/Geri psych was consulted. She is yelling most of the time and at times sleeping.  E. coli sepsis due to UTI: Patient completed antibiotics  Febrile episode on 12/28 and 11/06/22: Ordered labs shows elevated WBC count but Pro-Cal is stable, BP stable.  Will obtain UA and chest x-ray.  Wonder if it is related to her PMR, also immunosuppressive. Reviewed chest x-ray-no acute finding, BMP stable.  If he has recurrent fever obtain respiratory virus panel  Hypertension BP stable on 2.5 mg amlodipine, metoprolol 25 twice daily. Prolonged QTc monitor minimize QT prolonging medication monitor electrolytes  PMR History of steroid-induced psychosis : Continue methotrexate.Patient has history of steroid induced psychosis currently not on steroids.  Diabetes mellitus blood sugar  is stable continue SSI. Recent Labs  Lab 11/09/22 1147  11/09/22 1542 11/09/22 2057 11/10/22 0750 11/10/22 1130  GLUCAP 130* 131* 125* 142* 176*      CAD: Continue her aspirin 81, Lipitor, metoprolol  Atrial fibrillation:Rate controlled with metoprolol, not on anticoagulation due to recurrent falls COPD: Currently not wheezing continue bronchodilators as needed.  HLD continue statin Chronic diastolic CHF: s/p 1 dose of Lasix previously, BNP is normal.  Monitor Mild normocytic anemia:Monitor.   DVT prophylaxis: enoxaparin (LOVENOX) injection 40 mg Start: 10/25/22 1000 Code Status:   Code Status: DNR Family Communication: Discussed with daughter on phone  Patient status is: Inpatient because of deconditioning debility fever Level of care: Med-Surg   Dispo: The patient is from: home            Anticipated disposition: SNF -medically stable and awaiting placement  Objective: Vitals last 24 hrs: Vitals:   11/09/22 1540 11/09/22 1936 11/10/22 0426 11/10/22 0858  BP: (!) 109/47 126/60 131/60 128/61  Pulse: 62 77 73 80  Resp: 17 18 18 18   Temp: 98 F (36.7 C) 98.6 F (37 C) 98.3 F (36.8 C) 98.1 F (36.7 C)  TempSrc: Oral Oral Oral Oral  SpO2: 94% 96% 92% 95%  Weight:      Height:       Weight change:   Physical Examination: General.  Frail elderly lady, in no acute distress. Pulmonary.  Lungs clear bilaterally, normal respiratory effort. CV.  Regular rate and rhythm, no JVD, rub or murmur. Abdomen.  Soft, nontender, nondistended, BS positive. CNS.  Alert and oriented .  No focal neurologic deficit. Extremities.  No edema, no cyanosis, pulses intact and symmetrical. Psychiatry.  Judgment and insight appears impaired.  Medications reviewed:  Scheduled Meds:  amLODipine  2.5 mg Oral Daily   aspirin  81 mg Oral Daily   atorvastatin  40 mg Oral Daily   Chlorhexidine Gluconate Cloth  6 each Topical Daily   divalproex  125 mg Oral Q12H   enoxaparin (LOVENOX) injection  40 mg Subcutaneous V37T   folic acid  1 mg Oral Daily    gabapentin  100 mg Oral QHS   insulin aspart  0-15 Units Subcutaneous TID WC   insulin aspart  0-5 Units Subcutaneous QHS   LORazepam  0.25 mg Oral Daily   LORazepam  0.5 mg Oral QHS   methotrexate  12.5 mg Oral Q Sat   metoprolol tartrate  25 mg Oral BID   mouth rinse  15 mL Mouth Rinse 4 times per day   polyethylene glycol  17 g Oral Daily   Continuous Infusions:    Diet Order             Diet heart healthy/carb modified Room service appropriate? No; Fluid consistency: Thin  Diet effective now                  Intake/Output Summary (Last 24 hours) at 11/10/2022 1446 Last data filed at 11/10/2022 1345 Gross per 24 hour  Intake 240 ml  Output --  Net 240 ml    Net IO Since Admission: -3,977.13 mL [11/10/22 1446]  Wt Readings from Last 3 Encounters:  10/24/22 63 kg  10/18/22 63.1 kg  09/02/22 62.4 kg     Unresulted Labs (From admission, onward)     Start     Ordered   11/06/22 0759  Urinalysis, Routine w reflex microscopic Urine, Catheterized  Once,   R  11/06/22 0759          Data Reviewed: I have personally reviewed following labs and imaging studies CBC: Recent Labs  Lab 11/06/22 0843 11/07/22 0459  WBC 20.3* 16.4*  HGB 12.0 11.1*  HCT 37.4 34.5*  MCV 93.3 93.8  PLT 424* 429*    Basic Metabolic Panel: Recent Labs  Lab 11/06/22 0410 11/07/22 0459  NA 144 144  K 3.6 3.5  CL 101 101  CO2 30 31  GLUCOSE 199* 175*  BUN 31* 37*  CREATININE 0.86 0.85  CALCIUM 9.6 9.1    GFR: CBG: Recent Labs  Lab 11/09/22 1147 11/09/22 1542 11/09/22 2057 11/10/22 0750 11/10/22 1130  GLUCAP 130* 131* 125* 142* 176*   Sepsis Labs: Recent Labs  Lab 11/06/22 0407 11/07/22 0459 11/08/22 0608  PROCALCITON 0.13 <0.10 <0.10     No results found for this or any previous visit (from the past 240 hour(s)).   Antimicrobials: Anti-infectives (From admission, onward)    Start     Dose/Rate Route Frequency Ordered Stop   10/25/22 0100   cefTRIAXone (ROCEPHIN) 1 g in sodium chloride 0.9 % 100 mL IVPB        1 g 200 mL/hr over 30 Minutes Intravenous Every 24 hours 10/25/22 0054 10/29/22 0123   10/25/22 0015  cefTRIAXone (ROCEPHIN) 1 g in sodium chloride 0.9 % 100 mL IVPB  Status:  Discontinued        1 g 200 mL/hr over 30 Minutes Intravenous  Once 10/25/22 0008 10/25/22 0057      Culture/Microbiology    Component Value Date/Time   SDES BLOOD BLOOD RIGHT HAND 10/27/2022 1737   SPECREQUEST  10/27/2022 1737    BOTTLES DRAWN AEROBIC AND ANAEROBIC Blood Culture adequate volume   CULT  10/27/2022 1737    NO GROWTH 5 DAYS Performed at [facility], [address] [place], [place] [address]    REPTSTATUS 11/01/2022 FINAL 10/27/2022 1737  Radiology Studies: No results found.  LOS: 16 days  Arnetha Courser, MD [facility]  11/10/2022, 2:46 PM

## 2022-11-10 NOTE — Telephone Encounter (Signed)
Patient called and wanted to talk with Dr. Fletcher Anon or nurse. Please call back to discuss.

## 2022-11-11 DIAGNOSIS — G9341 Metabolic encephalopathy: Secondary | ICD-10-CM | POA: Diagnosis not present

## 2022-11-11 LAB — GLUCOSE, CAPILLARY
Glucose-Capillary: 140 mg/dL — ABNORMAL HIGH (ref 70–99)
Glucose-Capillary: 138 mg/dL — ABNORMAL HIGH (ref 70–99)
Glucose-Capillary: 156 mg/dL — ABNORMAL HIGH (ref 70–99)
Glucose-Capillary: 157 mg/dL — ABNORMAL HIGH (ref 70–99)

## 2022-11-11 NOTE — Progress Notes (Signed)
PROGRESS NOTE Tanya Cole  DGL:[id number] DOB: 09/09/1933 DOA: 10/24/2022 PCP: Dale Guys, MD   Brief Narrative/Hospital Course: 89 year old female with past medical history of diastolic CHF, VRE UTI previously, polymyalgia rheumatica, paroxysmal atrial fibrillation and senile dementia brought into the emergency room on 12/25 for visual hallucinations. Workup revealed urinary tract infection and patient brought in for further evaluation. Infection treated and patient felt to be back at baseline. Palliative care and discussion with family about disposition some family members feel he cannot care for her at home any longer. Plan is for patient to go to skilled nursing with palliative care following .  1/8: Hemodynamically stable.  PT is recommending SNF. Completed a course of antibiotic.  Patient did had a febrile episode early morning on 1/7, remained afebrile since then.  Labs with some improvement of leukocytosis to 16.4, procalcitonin improving and now negative. BMP stable.  Repeat chest x-ray done yesterday with no active disease. It was thought to be related to her PMR.  Patient had an history of steroid-induced psychosis and is currently on methotrexate for PMR  1/9: Patient was eventually able to void this morning after removing Foley catheter yesterday late afternoon.  Received some IV fluid as there was not much volume noted on bladder scan initially.  Still no bed availability for SNF.  1/10: Patient remained medically stable with still no bed offer.  Able to void and eating well. She wants to go home.  1/11: Remained medically stable.  Awaiting SNF placement.  1/12: No new concern.  Still awaiting placement   Subjective: Patient was seen and examined today.  No new complaints.   Assessment and Plan: Principal Problem:   Acute metabolic encephalopathy Active Problems:   Hallucinations   Urinary tract infection   Hypertension   Prolonged QT interval   Polymyalgia  rheumatica syndrome (HCC)   Dementia with behavioral disturbance (HCC)   History of Steroid-induced psychosis, with hallucinations (HCC)   Diabetes mellitus (HCC)   CAD (coronary artery disease)   Chronic diastolic CHF (congestive heart failure) (HCC)   COPD (chronic obstructive pulmonary disease) (HCC)   Atrial fibrillation (HCC)   Delirium   Visual hallucination Acute metabolic encephalopathy and delirium Background history of dementia with behavioral disturbance: Previous history of psychiatric illness: Initially presented with a basal oxygenation, but her history of dementia previous psychiatric illness.  Found to have acute UTI contributing to her presentation seen by psychiatry Depakote added overall some improvement interacting some.  Continue with supportive care PT OT fall precaution delirium precaution.  Continue medication to keep for calm at the same time avoiding oversedation, palliative care input appreciated continue Depakote, folic acid, Neurontin bedtime Ativan 0.25 mg daily and 0.5 bedtime.  Holding Aricept and Namenda due to prolonged QTc.  Psych/Geri psych was consulted. She is yelling most of the time and at times sleeping.  E. coli sepsis due to UTI: Patient completed antibiotics  Febrile episode on 12/28 and 11/06/22: Ordered labs shows elevated WBC count but Pro-Cal is stable, BP stable.  Will obtain UA and chest x-ray.  Wonder if it is related to her PMR, also immunosuppressive. Reviewed chest x-ray-no acute finding, BMP stable.  If he has recurrent fever obtain respiratory virus panel  Hypertension BP stable on 2.5 mg amlodipine, metoprolol 25 twice daily. Prolonged QTc monitor minimize QT prolonging medication monitor electrolytes  PMR History of steroid-induced psychosis : Continue methotrexate.Patient has history of steroid induced psychosis currently not on steroids.  Diabetes mellitus blood sugar is  stable continue SSI. Recent Labs  Lab 11/10/22 1617  11/10/22 2118 11/11/22 0749 11/11/22 1218 11/11/22 1635  GLUCAP 125* 133* 140* 138* 156*      CAD: Continue her aspirin 81, Lipitor, metoprolol  Atrial fibrillation:Rate controlled with metoprolol, not on anticoagulation due to recurrent falls COPD: Currently not wheezing continue bronchodilators as needed.  HLD continue statin Chronic diastolic CHF: s/p 1 dose of Lasix previously, BNP is normal.  Monitor Mild normocytic anemia:Monitor.   DVT prophylaxis: enoxaparin (LOVENOX) injection 40 mg Start: 10/25/22 1000 Code Status:   Code Status: DNR Family Communication: Discussed with son and PIL at bedside  Patient status is: Inpatient because of deconditioning debility fever Level of care: Med-Surg   Dispo: The patient is from: home            Anticipated disposition: SNF -medically stable and awaiting placement  Objective: Vitals last 24 hrs: Vitals:   11/10/22 1743 11/10/22 2028 11/11/22 0740 11/11/22 1637  BP: (!) 119/48 124/63 (!) 120/59 124/60  Pulse: 71 79 76 74  Resp: 18 19 18 18   Temp: 97.7 F (36.5 C) 98 F (36.7 C) 99.4 F (37.4 C) (!) 97 F (36.1 C)  TempSrc:      SpO2: 96% 96% 95% 97%  Weight:      Height:       Weight change:   Physical Examination: General.  Frail elderly lady, in no acute distress. Pulmonary.  Lungs clear bilaterally, normal respiratory effort. CV.  Regular rate and rhythm, no JVD, rub or murmur. Abdomen.  Soft, nontender, nondistended, BS positive. CNS.  Alert and oriented to self only.  No focal neurologic deficit. Extremities.  No edema, no cyanosis, pulses intact and symmetrical. Psychiatry.  Judgment and insight appears impaired  Medications reviewed:  Scheduled Meds:  amLODipine  2.5 mg Oral Daily   aspirin  81 mg Oral Daily   atorvastatin  40 mg Oral Daily   divalproex  125 mg Oral Q12H   enoxaparin (LOVENOX) injection  40 mg Subcutaneous N62X   folic acid  1 mg Oral Daily   gabapentin  100 mg Oral QHS   insulin aspart   0-15 Units Subcutaneous TID WC   insulin aspart  0-5 Units Subcutaneous QHS   LORazepam  0.25 mg Oral Daily   LORazepam  0.5 mg Oral QHS   methotrexate  12.5 mg Oral Q Sat   metoprolol tartrate  25 mg Oral BID   mouth rinse  15 mL Mouth Rinse 4 times per day   polyethylene glycol  17 g Oral Daily   Continuous Infusions:    Diet Order             Diet heart healthy/carb modified Room service appropriate? No; Fluid consistency: Thin  Diet effective now                  Intake/Output Summary (Last 24 hours) at 11/11/2022 1704 Last data filed at 11/11/2022 1300 Gross per 24 hour  Intake 100 ml  Output 750 ml  Net -650 ml    Net IO Since Admission: -4,627.13 mL [11/11/22 1704]  Wt Readings from Last 3 Encounters:  10/24/22 63 kg  10/18/22 63.1 kg  09/02/22 62.4 kg     Unresulted Labs (From admission, onward)     Start     Ordered   11/06/22 0759  Urinalysis, Routine w reflex microscopic Urine, Catheterized  Once,   R        11/06/22 0759  Data Reviewed: I have personally reviewed following labs and imaging studies CBC: Recent Labs  Lab 11/06/22 0843 11/07/22 0459  WBC 20.3* 16.4*  HGB 12.0 11.1*  HCT 37.4 34.5*  MCV 93.3 93.8  PLT 424* 429*    Basic Metabolic Panel: Recent Labs  Lab 11/06/22 0410 11/07/22 0459  NA 144 144  K 3.6 3.5  CL 101 101  CO2 30 31  GLUCOSE 199* 175*  BUN 31* 37*  CREATININE 0.86 0.85  CALCIUM 9.6 9.1    GFR: CBG: Recent Labs  Lab 11/10/22 1617 11/10/22 2118 11/11/22 0749 11/11/22 1218 11/11/22 1635  GLUCAP 125* 133* 140* 138* 156*   Sepsis Labs: Recent Labs  Lab 11/06/22 0407 11/07/22 0459 11/08/22 0608  PROCALCITON 0.13 <0.10 <0.10     No results found for this or any previous visit (from the past 240 hour(s)).   Antimicrobials: Anti-infectives (From admission, onward)    Start     Dose/Rate Route Frequency Ordered Stop   10/25/22 0100  cefTRIAXone (ROCEPHIN) 1 g in sodium chloride 0.9 %  100 mL IVPB        1 g 200 mL/hr over 30 Minutes Intravenous Every 24 hours 10/25/22 0054 10/29/22 0123   10/25/22 0015  cefTRIAXone (ROCEPHIN) 1 g in sodium chloride 0.9 % 100 mL IVPB  Status:  Discontinued        1 g 200 mL/hr over 30 Minutes Intravenous  Once 10/25/22 0008 10/25/22 0057      Culture/Microbiology    Component Value Date/Time   SDES BLOOD BLOOD RIGHT HAND 10/27/2022 1737   SPECREQUEST  10/27/2022 1737    BOTTLES DRAWN AEROBIC AND ANAEROBIC Blood Culture adequate volume   CULT  10/27/2022 1737    NO GROWTH 5 DAYS Performed at [facility], [address] [place], [place] [address]    REPTSTATUS 11/01/2022 FINAL 10/27/2022 1737  Radiology Studies: No results found.  LOS: 17 days  Lorella Nimrod, MD [facility]  11/11/2022, 5:04 PM

## 2022-11-12 DIAGNOSIS — G9341 Metabolic encephalopathy: Secondary | ICD-10-CM | POA: Diagnosis not present

## 2022-11-12 LAB — GLUCOSE, CAPILLARY
Glucose-Capillary: 132 mg/dL — ABNORMAL HIGH (ref 70–99)
Glucose-Capillary: 154 mg/dL — ABNORMAL HIGH (ref 70–99)
Glucose-Capillary: 124 mg/dL — ABNORMAL HIGH (ref 70–99)
Glucose-Capillary: 213 mg/dL — ABNORMAL HIGH (ref 70–99)

## 2022-11-12 NOTE — Progress Notes (Signed)
PROGRESS NOTE Tanya Cole  YIR:[id number] DOB: [birth year] DOA: 10/24/2022 PCP: [place], [place]   Brief Narrative/Hospital Course: 89 year old female with past medical history of diastolic CHF, VRE UTI previously, polymyalgia rheumatica, paroxysmal atrial fibrillation and senile dementia brought into the emergency room on 12/25 for visual hallucinations. Workup revealed urinary tract infection and patient brought in for further evaluation. Infection treated and patient felt to be back at baseline. Palliative care and discussion with family about disposition some family members feel he cannot care for her at home any longer. Plan is for patient to go to skilled nursing with palliative care following .  1/8: Hemodynamically stable.  PT is recommending SNF. Completed a course of antibiotic.  Patient did had a febrile episode early morning on 1/7, remained afebrile since then.  Labs with some improvement of leukocytosis to 16.4, procalcitonin improving and now negative. BMP stable.  Repeat chest x-ray done yesterday with no active disease. It was thought to be related to her PMR.  Patient had an history of steroid-induced psychosis and is currently on methotrexate for PMR  1/9: Patient was eventually able to void this morning after removing Foley catheter yesterday late afternoon.  Received some IV fluid as there was not much volume noted on bladder scan initially.  Still no bed availability for SNF.  1/10: Patient remained medically stable with still no bed offer.  Able to void and eating well. She wants to go home.  1/11: Remained medically stable.  Awaiting SNF placement.  1/12: No new concern.  Still awaiting placement.  1/13: Still awaiting placement   Subjective: Patient was moaning little bit when seen today, when asked about pain she said that I am hurting but unable to explain where.   Assessment and Plan: Principal Problem:   Acute metabolic encephalopathy Active  Problems:   Hallucinations   Urinary tract infection   Hypertension   Prolonged QT interval   Polymyalgia rheumatica syndrome (HCC)   Dementia with behavioral disturbance (HCC)   History of Steroid-induced psychosis, with hallucinations (HCC)   Diabetes mellitus (HCC)   CAD (coronary artery disease)   Chronic diastolic CHF (congestive heart failure) (HCC)   COPD (chronic obstructive pulmonary disease) (HCC)   Atrial fibrillation (HCC)   Delirium   Visual hallucination Acute metabolic encephalopathy and delirium Background history of dementia with behavioral disturbance: Previous history of psychiatric illness: Initially presented with a basal oxygenation, but her history of dementia previous psychiatric illness.  Found to have acute UTI contributing to her presentation seen by psychiatry Depakote added overall some improvement interacting some.  Continue with supportive care PT OT fall precaution delirium precaution.  Continue medication to keep for calm at the same time avoiding oversedation, palliative care input appreciated continue Depakote, folic acid, Neurontin bedtime Ativan 0.25 mg daily and 0.5 bedtime.  Holding Aricept and Namenda due to prolonged QTc.  Psych/Geri psych was consulted. She is yelling most of the time and at times sleeping.  E. coli sepsis due to UTI: Patient completed antibiotics  Febrile episode on 12/28 and 11/06/22: Ordered labs shows elevated WBC count but Pro-Cal is stable, BP stable.  Will obtain UA and chest x-ray.  Wonder if it is related to her PMR, also immunosuppressive. Reviewed chest x-ray-no acute finding, BMP stable.  If he has recurrent fever obtain respiratory virus panel  Hypertension BP stable on 2.5 mg amlodipine, metoprolol 25 twice daily. Prolonged QTc monitor minimize QT prolonging medication monitor electrolytes  PMR History of steroid-induced psychosis :  Continue methotrexate.Patient has history of steroid induced psychosis currently  not on steroids.  Diabetes mellitus blood sugar is stable continue SSI. Recent Labs  Lab 11/11/22 1218 11/11/22 1635 11/11/22 2125 11/12/22 0828 11/12/22 1210  GLUCAP 138* 156* 157* 132* 154*      CAD: Continue her aspirin 81, Lipitor, metoprolol  Atrial fibrillation:Rate controlled with metoprolol, not on anticoagulation due to recurrent falls COPD: Currently not wheezing continue bronchodilators as needed.  HLD continue statin Chronic diastolic CHF: s/p 1 dose of Lasix previously, BNP is normal.  Monitor Mild normocytic anemia:Monitor.   DVT prophylaxis: enoxaparin (LOVENOX) injection 40 mg Start: 10/25/22 1000 Code Status:   Code Status: DNR Family Communication: Discussed with son and PIL at bedside  Patient status is: Inpatient because of deconditioning debility fever Level of care: Med-Surg   Dispo: The patient is from: home            Anticipated disposition: SNF -medically stable and awaiting placement  Objective: Vitals last 24 hrs: Vitals:   11/11/22 1637 11/11/22 2123 11/12/22 0543 11/12/22 0830  BP: 124/60 (!) 117/59 114/73 (!) 142/62  Pulse: 74 73 79 73  Resp: 18 20 18 18   Temp: (!) 97 F (36.1 C) 98.1 F (36.7 C) 99.1 F (37.3 C) 97.8 F (36.6 C)  TempSrc:  Oral Oral   SpO2: 97% 97% 96% 92%  Weight:      Height:       Weight change:   Physical Examination: General.  Frail elderly lady, in no acute distress. Pulmonary.  Lungs clear bilaterally, normal respiratory effort. CV.  Regular rate and rhythm, no JVD, rub or murmur. Abdomen.  Soft, nontender, nondistended, BS positive. CNS.  Alert and oriented to self only.  No focal neurologic deficit. Extremities.  No edema, no cyanosis, pulses intact and symmetrical. Psychiatry.  Judgment and insight appears impaired  Medications reviewed:  Scheduled Meds:  amLODipine  2.5 mg Oral Daily   aspirin  81 mg Oral Daily   atorvastatin  40 mg Oral Daily   divalproex  125 mg Oral Q12H   enoxaparin  (LOVENOX) injection  40 mg Subcutaneous K16W   folic acid  1 mg Oral Daily   gabapentin  100 mg Oral QHS   insulin aspart  0-15 Units Subcutaneous TID WC   insulin aspart  0-5 Units Subcutaneous QHS   LORazepam  0.25 mg Oral Daily   LORazepam  0.5 mg Oral QHS   methotrexate  12.5 mg Oral Q Sat   metoprolol tartrate  25 mg Oral BID   mouth rinse  15 mL Mouth Rinse 4 times per day   polyethylene glycol  17 g Oral Daily   Continuous Infusions:    Diet Order             Diet heart healthy/carb modified Room service appropriate? No; Fluid consistency: Thin  Diet effective now                  Intake/Output Summary (Last 24 hours) at 11/12/2022 1453 Last data filed at 11/11/2022 1830 Gross per 24 hour  Intake 100 ml  Output --  Net 100 ml    Net IO Since Admission: -4,527.13 mL [11/12/22 1453]  Wt Readings from Last 3 Encounters:  10/24/22 63 kg  10/18/22 63.1 kg  09/02/22 62.4 kg     Unresulted Labs (From admission, onward)     Start     Ordered   11/06/22 0759  Urinalysis, Routine w reflex microscopic  Urine, Catheterized  Once,   R        11/06/22 0759          Data Reviewed: I have personally reviewed following labs and imaging studies CBC: Recent Labs  Lab 11/06/22 0843 11/07/22 0459  WBC 20.3* 16.4*  HGB 12.0 11.1*  HCT 37.4 34.5*  MCV 93.3 93.8  PLT 424* 429*    Basic Metabolic Panel: Recent Labs  Lab 11/06/22 0410 11/07/22 0459  NA 144 144  K 3.6 3.5  CL 101 101  CO2 30 31  GLUCOSE 199* 175*  BUN 31* 37*  CREATININE 0.86 0.85  CALCIUM 9.6 9.1    GFR: CBG: Recent Labs  Lab 11/11/22 1218 11/11/22 1635 11/11/22 2125 11/12/22 0828 11/12/22 1210  GLUCAP 138* 156* 157* 132* 154*   Sepsis Labs: Recent Labs  Lab 11/06/22 0407 11/07/22 0459 11/08/22 0608  PROCALCITON 0.13 <0.10 <0.10     No results found for this or any previous visit (from the past 240 hour(s)).   Antimicrobials: Anti-infectives (From admission, onward)     Start     Dose/Rate Route Frequency Ordered Stop   10/25/22 0100  cefTRIAXone (ROCEPHIN) 1 g in sodium chloride 0.9 % 100 mL IVPB        1 g 200 mL/hr over 30 Minutes Intravenous Every 24 hours 10/25/22 0054 10/29/22 0123   10/25/22 0015  cefTRIAXone (ROCEPHIN) 1 g in sodium chloride 0.9 % 100 mL IVPB  Status:  Discontinued        1 g 200 mL/hr over 30 Minutes Intravenous  Once 10/25/22 0008 10/25/22 0057      Culture/Microbiology    Component Value Date/Time   SDES BLOOD BLOOD RIGHT HAND 10/27/2022 1737   SPECREQUEST  10/27/2022 1737    BOTTLES DRAWN AEROBIC AND ANAEROBIC Blood Culture adequate volume   CULT  10/27/2022 1737    NO GROWTH 5 DAYS Performed at [facility], [address] [place], [place] [address]    REPTSTATUS 11/01/2022 FINAL 10/27/2022 1737  Radiology Studies: No results found.  LOS: 18 days  Lorella Nimrod, MD [facility]  11/12/2022, 2:53 PM

## 2022-11-13 DIAGNOSIS — G9341 Metabolic encephalopathy: Secondary | ICD-10-CM | POA: Diagnosis not present

## 2022-11-13 LAB — GLUCOSE, CAPILLARY
Glucose-Capillary: 161 mg/dL — ABNORMAL HIGH (ref 70–99)
Glucose-Capillary: 173 mg/dL — ABNORMAL HIGH (ref 70–99)
Glucose-Capillary: 163 mg/dL — ABNORMAL HIGH (ref 70–99)
Glucose-Capillary: 167 mg/dL — ABNORMAL HIGH (ref 70–99)

## 2022-11-13 NOTE — Progress Notes (Signed)
Physical Therapy Treatment Patient Details Name: Tanya Cole MRN: [id number] DOB: [birth year] Today's Date: 11/13/2022   History of Present Illness 89 y.o. female with medical history significant for DM, HTN, diastolic CHF, CAD, polymyalgia rheumatica, paroxysmal atrial fibrillation not on anticoagulation due to high fall risk, dementia who was brought to the ED with confusion x 1 to 2 days beyond her baseline associated with visual hallucinations.  Workup thus far has been unremarkable except for abnormal urinalysis.  She was also found to have QTc prolongation hence psychogenic medications are limited.    PT Comments    Pt ready for session.  Struggles to get to EOB without mod a x 1 and much encouragement.  Right lateral lean with min a x to remain upright.  Will hold balance for a short moment then lean R.  She is able to stand with mod a x 1 and does fairly well taking several small steps to recliner at bedside.  Seated BLE AAROM but she needs tactile and verbal cues to complete exercises.  Remains in chair for lunch with needs met.  SNF remains appropriate for transition after discharge.   Recommendations for follow up therapy are one component of a multi-disciplinary discharge planning process, led by the attending physician.  Recommendations may be updated based on patient status, additional functional criteria and insurance authorization.  Follow Up Recommendations  Skilled nursing-short term rehab (<3 hours/day)     Assistance Recommended at Discharge Frequent or constant Supervision/Assistance  Patient can return home with the following A lot of help with bathing/dressing/bathroom;Assistance with cooking/housework;Assistance with feeding;Direct supervision/assist for medications management;Assist for transportation;Help with stairs or ramp for entrance;Two people to help with walking and/or transfers   Equipment Recommendations  None recommended by PT    Recommendations for  Other Services       Precautions / Restrictions Precautions Precautions: Fall Restrictions Weight Bearing Restrictions: No     Mobility  Bed Mobility Overal bed mobility: Needs Assistance Bed Mobility: Supine to Sit     Supine to sit: Mod assist          Transfers Overall transfer level: Needs assistance Equipment used: None Transfers: Sit to/from Stand Sit to Stand: Mod assist Stand pivot transfers: Mod assist              Ambulation/Gait               General Gait Details: Took a few steps from EOB to [place]             Wheelchair Mobility    Modified Rankin (Stroke Patients Only)       Balance Overall balance assessment: Needs assistance Sitting-balance support: Bilateral upper extremity supported, Feet supported Sitting balance-Leahy Scale: Poor Sitting balance - Comments: Pt with R lateral lean in sitting, appears unaware of this so unable to correct without cuing/assist. Postural control: Right lateral lean Standing balance support: Bilateral upper extremity supported, During functional activity Standing balance-Leahy Scale: Poor Standing balance comment: assist by writer to support but she is able to step to chair at bedside.                            Cognition Arousal/Alertness: Awake/alert Behavior During Therapy: Flat affect Overall Cognitive Status: History of cognitive impairments - at baseline  Exercises Other Exercises Other Exercises: BLE AAROM in sitting. needs hands on tactile cues to complete    General Comments        Pertinent Vitals/Pain Pain Assessment Pain Assessment: No/denies pain    Home Living                          Prior Function            PT Goals (current goals can now be found in the care plan section) Progress towards PT goals: Progressing toward goals    Frequency    Min 2X/week      PT  Plan Current plan remains appropriate    Co-evaluation              AM-PAC PT "6 Clicks" Mobility   Outcome Measure  Help needed turning from your back to your side while in a flat bed without using bedrails?: A Lot Help needed moving from lying on your back to sitting on the side of a flat bed without using bedrails?: A Lot Help needed moving to and from a bed to a chair (including a wheelchair)?: A Lot Help needed standing up from a chair using your arms (e.g., wheelchair or bedside chair)?: A Lot Help needed to walk in hospital room?: A Lot Help needed climbing 3-5 steps with a railing? : Total 6 Click Score: 11    End of Session Equipment Utilized During Treatment: Gait belt Activity Tolerance: Patient tolerated treatment well Patient left: with call bell/phone within reach;in chair;with chair alarm set Nurse Communication: Mobility status PT Visit Diagnosis: Other abnormalities of gait and mobility (R26.89);Muscle weakness (generalized) (M62.81);Difficulty in walking, not elsewhere classified (R26.2)     Time: 5573-2202 PT Time Calculation (min) (ACUTE ONLY): 9 min  Charges:                      Chesley Noon, PTA 11/13/22, 11:26 AM

## 2022-11-13 NOTE — TOC Progression Note (Addendum)
Transition of Care [facility]) - Progression Note    Patient Details  Name: Tanya Cole MRN: [id number] Date of Birth: Apr 15, 1933  Transition of Care [facility]) CM/SW [place], LCSW Phone Number: 11/13/2022, 9:35 AM  Clinical Narrative:    Per TOC handoff on 11/10/22 patient's daughter chose [facility].  CSW attempted to locate pending insurance auth in [facility], unable to locate. Asked PT to see patient so that Josem Kaufmann can be started.  MD updated. TOC handoff updated.  2:20- Started insurance auth in [facility] portal.    Expected Discharge Plan:  (waitiing on recs from team) Barriers to Discharge: Continued Medical Work up  Expected Discharge Plan and Services                                               Social Determinants of Health (SDOH) Interventions SDOH Screenings   Food Insecurity: No Food Insecurity (10/26/2022)  Housing: Low Risk  (10/26/2022)  Transportation Needs: No Transportation Needs (10/26/2022)  Utilities: Not At Risk (10/26/2022)  Alcohol Screen: Low Risk  (05/25/2022)  Depression (PHQ2-9): High Risk (10/18/2022)  Financial Resource Strain: Low Risk  (05/27/2022)  Physical Activity: Insufficiently Active (01/07/2019)  Social Connections: Moderately Isolated (05/27/2022)  Stress: No Stress Concern Present (05/27/2022)  Tobacco Use: Low Risk  (11/01/2022)    Readmission Risk Interventions    05/30/2022   10:28 AM 05/08/2022   10:08 AM 05/14/2021    9:26 AM  Readmission Risk Prevention Plan  Transportation Screening Complete Complete Complete  PCP or Specialist Appt within 5-7 Days   Complete  Home Care Screening   Complete  Medication Review (RN CM)   Referral to Pharmacy  Medication Review (RN Care Manager) Complete Complete   PCP or Specialist appointment within 3-5 days of discharge  Complete   HRI or Home Care Consult Complete Complete   SW Recovery Care/Counseling Consult Complete Complete   Palliative Care  Screening Not Applicable Not Applicable   Skilled Nursing Facility  Complete

## 2022-11-13 NOTE — Progress Notes (Signed)
PROGRESS NOTE Tanya Cole  PPI:[id number] DOB: November 28, 1932 DOA: 10/24/2022 PCP: Einar [place], MD   Brief Narrative/Hospital Course: 89 year old female with past medical history of diastolic CHF, VRE UTI previously, polymyalgia rheumatica, paroxysmal atrial fibrillation and senile dementia brought into the emergency room on 12/25 for visual hallucinations. Workup revealed urinary tract infection and patient brought in for further evaluation. Infection treated and patient felt to be back at baseline. Palliative care and discussion with family about disposition some family members feel he cannot care for her at home any longer. Plan is for patient to go to skilled nursing with palliative care following .  1/8: Hemodynamically stable.  PT is recommending SNF. Completed a course of antibiotic.  Patient did had a febrile episode early morning on 1/7, remained afebrile since then.  Labs with some improvement of leukocytosis to 16.4, procalcitonin improving and now negative. BMP stable.  Repeat chest x-ray done yesterday with no active disease. It was thought to be related to her PMR.  Patient had an history of steroid-induced psychosis and is currently on methotrexate for PMR  1/9: Patient was eventually able to void this morning after removing Foley catheter yesterday late afternoon.  Received some IV fluid as there was not much volume noted on bladder scan initially.  Still no bed availability for SNF.  1/10: Patient remained medically stable with still no bed offer.  Able to void and eating well. She wants to go home.  1/11: Remained medically stable.  Awaiting SNF placement.  1/12: No new concern.  Still awaiting placement.  1/13: Still awaiting placement.  1/14: Still awaiting placement, apparently prior TOC never even started insurance authorization despite putting in her mort and communicating with me.  PT recommendations remain for SNF.   Subjective: Patient was getting cleaned  when seen today.  No new concern..   Assessment and Plan: Principal Problem:   Acute metabolic encephalopathy Active Problems:   Hallucinations   Urinary tract infection   Hypertension   Prolonged QT interval   Polymyalgia rheumatica syndrome (HCC)   Dementia with behavioral disturbance (HCC)   History of Steroid-induced psychosis, with hallucinations (HCC)   Diabetes mellitus (HCC)   CAD (coronary artery disease)   Chronic diastolic CHF (congestive heart failure) (HCC)   COPD (chronic obstructive pulmonary disease) (HCC)   Atrial fibrillation (HCC)   Delirium   Visual hallucination Acute metabolic encephalopathy and delirium Background history of dementia with behavioral disturbance: Previous history of psychiatric illness: Initially presented with a basal oxygenation, but her history of dementia previous psychiatric illness.  Found to have acute UTI contributing to her presentation seen by psychiatry Depakote added overall some improvement interacting some.  Continue with supportive care PT OT fall precaution delirium precaution.  Continue medication to keep for calm at the same time avoiding oversedation, palliative care input appreciated continue Depakote, folic acid, Neurontin bedtime Ativan 0.25 mg daily and 0.5 bedtime.  Holding Aricept and Namenda due to prolonged QTc.  Psych/Geri psych was consulted. She is yelling most of the time and at times sleeping.  E. coli sepsis due to UTI: Patient completed antibiotics  Febrile episode on 12/28 and 11/06/22: Ordered labs shows elevated WBC count but Pro-Cal is stable, BP stable.  Will obtain UA and chest x-ray.  Wonder if it is related to her PMR, also immunosuppressive. Reviewed chest x-ray-no acute finding, BMP stable.  If he has recurrent fever obtain respiratory virus panel  Hypertension BP stable on 2.5 mg amlodipine, metoprolol 25 twice daily.  Prolonged QTc monitor minimize QT prolonging medication monitor  electrolytes  PMR History of steroid-induced psychosis : Continue methotrexate.Patient has history of steroid induced psychosis currently not on steroids.  Diabetes mellitus blood sugar is stable continue SSI. Recent Labs  Lab 11/12/22 1210 11/12/22 1627 11/12/22 2005 11/13/22 0742 11/13/22 1128  GLUCAP 154* 124* 213* 161* 173*      CAD: Continue her aspirin 81, Lipitor, metoprolol  Atrial fibrillation:Rate controlled with metoprolol, not on anticoagulation due to recurrent falls COPD: Currently not wheezing continue bronchodilators as needed.  HLD continue statin Chronic diastolic CHF: s/p 1 dose of Lasix previously, BNP is normal.  Monitor Mild normocytic anemia:Monitor.   DVT prophylaxis: enoxaparin (LOVENOX) injection 40 mg Start: 10/25/22 1000 Code Status:   Code Status: DNR Family Communication: Discussed with son and PIL at bedside  Patient status is: Inpatient because of deconditioning debility fever Level of care: Med-Surg   Dispo: The patient is from: home            Anticipated disposition: SNF -medically stable and awaiting placement  Objective: Vitals last 24 hrs: Vitals:   11/12/22 1632 11/12/22 2003 11/13/22 0556 11/13/22 0741  BP: (!) 110/47 (!) 112/56 (!) 125/55 117/60  Pulse: (!) 58 70 78 83  Resp:  20 18 16   Temp: 99 F (37.2 C) 98.3 F (36.8 C) 98.4 F (36.9 C) 98.6 F (37 C)  TempSrc: Oral Oral Oral   SpO2: 96% 97% 93% 96%  Weight:      Height:       Weight change:   Physical Examination: General.  Frail elderly lady, in no acute distress. Pulmonary.  Lungs clear bilaterally, normal respiratory effort. CV.  Regular rate and rhythm, no JVD, rub or murmur. Abdomen.  Soft, nontender, nondistended, BS positive. CNS.  Alert and oriented to self only.  No focal neurologic deficit. Extremities.  No edema, no cyanosis, pulses intact and symmetrical. Psychiatry.  Judgment and insight appears impaired.  Medications reviewed:  Scheduled Meds:   amLODipine  2.5 mg Oral Daily   aspirin  81 mg Oral Daily   atorvastatin  40 mg Oral Daily   divalproex  125 mg Oral Q12H   enoxaparin (LOVENOX) injection  40 mg Subcutaneous Q24H   folic acid  1 mg Oral Daily   gabapentin  100 mg Oral QHS   insulin aspart  0-15 Units Subcutaneous TID WC   insulin aspart  0-5 Units Subcutaneous QHS   LORazepam  0.25 mg Oral Daily   LORazepam  0.5 mg Oral QHS   methotrexate  12.5 mg Oral Q Sat   metoprolol tartrate  25 mg Oral BID   mouth rinse  15 mL Mouth Rinse 4 times per day   polyethylene glycol  17 g Oral Daily   Continuous Infusions:    Diet Order             Diet heart healthy/carb modified Room service appropriate? No; Fluid consistency: Thin  Diet effective now                 No intake or output data in the 24 hours ending 11/13/22 1214  Net IO Since Admission: -4,527.13 mL [11/13/22 1214]  Wt Readings from Last 3 Encounters:  10/24/22 63 kg  10/18/22 63.1 kg  09/02/22 62.4 kg     Unresulted Labs (From admission, onward)    None     Data Reviewed: I have personally reviewed following labs and imaging studies CBC: Recent Labs  Lab 11/07/22 0459  WBC 16.4*  HGB 11.1*  HCT 34.5*  MCV 93.8  PLT 429*    Basic Metabolic Panel: Recent Labs  Lab 11/07/22 0459  NA 144  K 3.5  CL 101  CO2 31  GLUCOSE 175*  BUN 37*  CREATININE 0.85  CALCIUM 9.1    GFR: CBG: Recent Labs  Lab 11/12/22 1210 11/12/22 1627 11/12/22 2005 11/13/22 0742 11/13/22 1128  GLUCAP 154* 124* 213* 161* 173*   Sepsis Labs: Recent Labs  Lab 11/07/22 0459 11/08/22 0608  PROCALCITON <0.10 <0.10     No results found for this or any previous visit (from the past 240 hour(s)).   Antimicrobials: Anti-infectives (From admission, onward)    Start     Dose/Rate Route Frequency Ordered Stop   10/25/22 0100  cefTRIAXone (ROCEPHIN) 1 g in sodium chloride 0.9 % 100 mL IVPB        1 g 200 mL/hr over 30 Minutes Intravenous Every 24 hours  10/25/22 0054 10/29/22 0123   10/25/22 0015  cefTRIAXone (ROCEPHIN) 1 g in sodium chloride 0.9 % 100 mL IVPB  Status:  Discontinued        1 g 200 mL/hr over 30 Minutes Intravenous  Once 10/25/22 0008 10/25/22 0057      Culture/Microbiology    Component Value Date/Time   SDES BLOOD BLOOD RIGHT HAND 10/27/2022 1737   SPECREQUEST  10/27/2022 1737    BOTTLES DRAWN AEROBIC AND ANAEROBIC Blood Culture adequate volume   CULT  10/27/2022 1737    NO GROWTH 5 DAYS Performed at [facility], [address] [place], [place] [address]    REPTSTATUS 11/01/2022 FINAL 10/27/2022 1737  Radiology Studies: No results found.  LOS: 19 days  Lorella Nimrod, MD [facility]  11/13/2022, 12:14 PM

## 2022-11-14 DIAGNOSIS — I5032 Chronic diastolic (congestive) heart failure: Secondary | ICD-10-CM

## 2022-11-14 DIAGNOSIS — R0902 Hypoxemia: Secondary | ICD-10-CM | POA: Diagnosis not present

## 2022-11-14 DIAGNOSIS — W19XXXA Unspecified fall, initial encounter: Secondary | ICD-10-CM | POA: Diagnosis not present

## 2022-11-14 DIAGNOSIS — I959 Hypotension, unspecified: Secondary | ICD-10-CM | POA: Diagnosis not present

## 2022-11-14 DIAGNOSIS — E1159 Type 2 diabetes mellitus with other circulatory complications: Secondary | ICD-10-CM | POA: Diagnosis not present

## 2022-11-14 DIAGNOSIS — R9431 Abnormal electrocardiogram [ECG] [EKG]: Secondary | ICD-10-CM | POA: Diagnosis not present

## 2022-11-14 DIAGNOSIS — F03918 Unspecified dementia, unspecified severity, with other behavioral disturbance: Secondary | ICD-10-CM | POA: Diagnosis not present

## 2022-11-14 DIAGNOSIS — R51 Headache with orthostatic component, not elsewhere classified: Secondary | ICD-10-CM | POA: Diagnosis not present

## 2022-11-14 DIAGNOSIS — R52 Pain, unspecified: Secondary | ICD-10-CM | POA: Diagnosis not present

## 2022-11-14 DIAGNOSIS — N390 Urinary tract infection, site not specified: Secondary | ICD-10-CM

## 2022-11-14 DIAGNOSIS — E039 Hypothyroidism, unspecified: Secondary | ICD-10-CM | POA: Diagnosis not present

## 2022-11-14 DIAGNOSIS — J449 Chronic obstructive pulmonary disease, unspecified: Secondary | ICD-10-CM | POA: Diagnosis not present

## 2022-11-14 DIAGNOSIS — G9341 Metabolic encephalopathy: Secondary | ICD-10-CM | POA: Diagnosis not present

## 2022-11-14 DIAGNOSIS — Z743 Need for continuous supervision: Secondary | ICD-10-CM | POA: Diagnosis not present

## 2022-11-14 DIAGNOSIS — K5900 Constipation, unspecified: Secondary | ICD-10-CM | POA: Diagnosis not present

## 2022-11-14 DIAGNOSIS — R443 Hallucinations, unspecified: Secondary | ICD-10-CM | POA: Diagnosis not present

## 2022-11-14 DIAGNOSIS — I2510 Atherosclerotic heart disease of native coronary artery without angina pectoris: Secondary | ICD-10-CM | POA: Diagnosis not present

## 2022-11-14 DIAGNOSIS — M353 Polymyalgia rheumatica: Secondary | ICD-10-CM | POA: Diagnosis not present

## 2022-11-14 DIAGNOSIS — E559 Vitamin D deficiency, unspecified: Secondary | ICD-10-CM | POA: Diagnosis not present

## 2022-11-14 DIAGNOSIS — R410 Disorientation, unspecified: Secondary | ICD-10-CM | POA: Diagnosis not present

## 2022-11-14 DIAGNOSIS — Z515 Encounter for palliative care: Secondary | ICD-10-CM | POA: Diagnosis not present

## 2022-11-14 DIAGNOSIS — J8410 Pulmonary fibrosis, unspecified: Secondary | ICD-10-CM | POA: Diagnosis not present

## 2022-11-14 DIAGNOSIS — R079 Chest pain, unspecified: Secondary | ICD-10-CM | POA: Diagnosis not present

## 2022-11-14 DIAGNOSIS — R195 Other fecal abnormalities: Secondary | ICD-10-CM | POA: Diagnosis not present

## 2022-11-14 DIAGNOSIS — R441 Visual hallucinations: Secondary | ICD-10-CM

## 2022-11-14 DIAGNOSIS — M130 Polyarthritis, unspecified: Secondary | ICD-10-CM | POA: Diagnosis not present

## 2022-11-14 DIAGNOSIS — M810 Age-related osteoporosis without current pathological fracture: Secondary | ICD-10-CM | POA: Diagnosis not present

## 2022-11-14 DIAGNOSIS — R062 Wheezing: Secondary | ICD-10-CM | POA: Diagnosis not present

## 2022-11-14 DIAGNOSIS — M6281 Muscle weakness (generalized): Secondary | ICD-10-CM | POA: Diagnosis not present

## 2022-11-14 DIAGNOSIS — I4891 Unspecified atrial fibrillation: Secondary | ICD-10-CM | POA: Diagnosis not present

## 2022-11-14 DIAGNOSIS — R109 Unspecified abdominal pain: Secondary | ICD-10-CM | POA: Diagnosis not present

## 2022-11-14 DIAGNOSIS — I10 Essential (primary) hypertension: Secondary | ICD-10-CM | POA: Diagnosis not present

## 2022-11-14 DIAGNOSIS — R5381 Other malaise: Secondary | ICD-10-CM | POA: Diagnosis not present

## 2022-11-14 LAB — GLUCOSE, CAPILLARY: Glucose-Capillary: 161 mg/dL — ABNORMAL HIGH (ref 70–99)

## 2022-11-14 MED ORDER — DIVALPROEX SODIUM 125 MG PO CSDR: 125.0000 mg | DELAYED_RELEASE_CAPSULE | Freq: Two times a day (BID) | ORAL | Status: DC

## 2022-11-14 MED ORDER — DICLOFENAC SODIUM 1 % EX GEL: 2.0000 g | Freq: Three times a day (TID) | CUTANEOUS | Status: DC | PRN

## 2022-11-14 MED ORDER — POLYETHYLENE GLYCOL 3350 17 G PO PACK: 17.0000 g | PACK | Freq: Every day | ORAL | 0 refills | Status: DC | PRN

## 2022-11-14 NOTE — Progress Notes (Signed)
Patient being discharged to [facility]. Called report to [employer], nurse at [facility]. No PIVs in place. All discharge paperwork in discharge packet. Patient will be transported via EMS.

## 2022-11-14 NOTE — Care Management Important Message (Signed)
Important Message  Patient Details  Name: Tanya Cole MRN: [id number] Date of Birth: July 21, 1933   Medicare Important Message Given:  Yes  I reviewed the Important Message from Medicare with the patient's HCPOA, [name] again and she is in agreement with the discharge. I thanked her for time and wished her mom a speedy recovery.    [name] 11/14/2022, 10:55 AM

## 2022-11-14 NOTE — Discharge Summary (Signed)
Physician Discharge Summary   Patient: Tanya Cole MRN: [id number] DOB: [birth year]  Admit date:     10/24/2022  Discharge date: 11/14/22  Discharge Physician: Lorella Nimrod   PCP: Einar Pheasant, MD   Recommendations at discharge:  Please obtain CBC and BMP in 1 to 2 weeks. Follow-up with primary care provider Please arrange outpatient palliative care Patient might need long-term care  Discharge Diagnoses: Principal Problem:   Acute metabolic encephalopathy Active Problems:   Hallucinations   Acute UTI   Hypertension   Prolonged QT interval   Polymyalgia rheumatica syndrome (HCC)   Dementia with behavioral disturbance (HCC)   History of Steroid-induced psychosis, with hallucinations (HCC)   Diabetes mellitus (HCC)   CAD (coronary artery disease)   Chronic diastolic CHF (congestive heart failure) (HCC)   COPD (chronic obstructive pulmonary disease) (HCC)   Atrial fibrillation (HCC)   Delirium   Hospital Course: 89 year old female with past medical history of diastolic CHF, VRE UTI previously, polymyalgia rheumatica, paroxysmal atrial fibrillation and senile dementia brought into the emergency room on 12/25 for visual hallucinations. Workup revealed urinary tract infection and patient brought in for further evaluation. Infection treated and patient felt to be back at baseline. Palliative care and discussion with family about disposition some family members feel he cannot care for her at home any longer. Plan is for patient to go to skilled nursing with palliative care following .  1/8: Hemodynamically stable.  PT is recommending SNF. Completed a course of antibiotic.  Patient did had a febrile episode early morning on 1/7, remained afebrile since then.  Labs with some improvement of leukocytosis to 16.4, procalcitonin improving and now negative. BMP stable.  Repeat chest x-ray done yesterday with no active disease. It was thought to be related to her PMR.  Patient had an  history of steroid-induced psychosis and is currently on methotrexate for PMR  1/9: Patient was eventually able to void this morning after removing Foley catheter yesterday late afternoon.  Received some IV fluid as there was not much volume noted on bladder scan initially.  Still no bed availability for SNF.  1/10: Patient remained medically stable with still no bed offer.  Able to void and eating well. She wants to go home.  1/11: Remained medically stable.  Awaiting SNF placement.  1/12: No new concern.  Still awaiting placement.  1/13: Still awaiting placement.  1/14: Still awaiting placement, apparently prior TOC never even started insurance authorization despite putting in her note and communicating with me.  PT recommendations remain for SNF.  1/15: Remained medically stable.  We obtained insurance authorization and patient is being discharged to rehab for further management.  She will continue on current medications and need to have a close follow-up with her providers for further recommendations.  Assessment and Plan: Hallucinations Acute metabolic encephalopathy Dementia Patient presents with visual hallucinations, crying and confusion above baseline for dementia Etiology possibly multifactorial and related to underlying dementia and psychiatric illness as well as UTI Has history of steroid-induced psychosis but is not currently on steroids for her polymyalgia rheumatica Treat UTI as separately detailed Continue home psych meds and consider psych consult  Acute UTI Patient met sepsis criteria secondary to E. coli UTI. Completed the course of antibiotic.  Hypertension Continue home amlodipine metoprolol and furosemide  Prolonged QT interval QT of 565 Avoid QT prolonging drugs Continuous cardiac monitoring  Dementia with behavioral disturbance (HCC) Holding Aricept and mirtazapine due to prolonged QT Patient was previously on Seroquel which  was  discontinued Considered psych/[facility] psych consult  Polymyalgia rheumatica syndrome ([place]) History of steroid-induced psychosis Patient no longer on steroids Continue methotrexate  Diabetes mellitus (HCC) Sliding scale coverage  CAD (coronary artery disease) No complaints of chest pain.  Continue aspirin, atorvastatin and metoprolol and nitroglycerin sublingual as needed  Atrial fibrillation (HCC) Continue metoprolol Continue amiodarone once verified Not currently on anticoagulation due to history of falls.  COPD (chronic obstructive pulmonary disease) (HCC) Not acutely exacerbated DuoNebs as needed  Chronic diastolic CHF (congestive heart failure) ([place]) Clinically euvolemic Continue furosemide, metoprolol   Consultants: Palliative care Procedures performed: None Disposition: Skilled nursing facility Diet recommendation:  Discharge Diet Orders (From admission, onward)     Start     Ordered   11/14/22 0000  Diet - low sodium heart healthy        11/14/22 0954           Cardiac and Carb modified diet DISCHARGE MEDICATION: Allergies as of 11/14/2022       Reactions   Tramadol Itching, Nausea And Vomiting        Medication List     STOP taking these medications    doxycycline 100 MG tablet Commonly known as: VIBRA-TABS   Vitamin D (Ergocalciferol) 1.25 MG (50000 UNIT) Caps capsule Commonly known as: DRISDOL       TAKE these medications    acetaminophen 500 MG tablet Commonly known as: TYLENOL Take 500-1,000 mg by mouth every 6 (six) hours as needed for mild pain or moderate pain.   Albuterol Sulfate 108 (90 Base) MCG/ACT Aepb Commonly known as: PROAIR RESPICLICK Inhale into the lungs every 4 (four) hours. PRN What changed: Another medication with the same name was removed. Continue taking this medication, and follow the directions you see here.   amLODipine 2.5 MG tablet Commonly known as: NORVASC Take 1 tablet by mouth once daily   aspirin 81  MG tablet Take 81 mg by mouth daily.   atorvastatin 80 MG tablet Commonly known as: LIPITOR TAKE 1 TABLET BY MOUTH ONCE DAILY AT  6PM   cholecalciferol 25 MCG (1000 UNIT) tablet Commonly known as: VITAMIN D3 Take 1,000 Units by mouth daily.   diclofenac Sodium 1 % Gel Commonly known as: VOLTAREN Apply 2 g topically 3 (three) times daily as needed (Leg pain).   divalproex 125 MG capsule Commonly known as: DEPAKOTE SPRINKLE Take 1 capsule (125 mg total) by mouth every 12 (twelve) hours.   donepezil 5 MG tablet Commonly known as: ARICEPT Take by mouth.   folic acid 1 MG tablet Commonly known as: FOLVITE Take 1 mg by mouth daily.   furosemide 40 MG tablet Commonly known as: LASIX Take 1 tablet (40 mg total) by mouth daily as needed (weight gain 5 lbs over 1-2 days, lower extremity swelling, shortness of breath. If medication does not resolve symptoms in 2 days please call Dr / 911).   gabapentin 100 MG capsule Commonly known as: NEURONTIN Take 100 mg by mouth at bedtime.   ipratropium-albuterol 0.5-2.5 (3) MG/3ML Soln Commonly known as: DUONEB Take 3 mLs by nebulization 2 (two) times daily as needed (shortness of breath or wheezing).   metFORMIN 500 MG tablet Commonly known as: GLUCOPHAGE TAKE 1 TABLET BY MOUTH TWICE DAILY WITH A MEAL   methotrexate 2.5 MG tablet Commonly known as: RHEUMATREX Take 12.5 mg by mouth every Saturday.   metoprolol tartrate 25 MG tablet Commonly known as: LOPRESSOR Take 1 tablet (25 mg total) by mouth 2 (  two) times daily.   mirtazapine 7.5 MG tablet Commonly known as: REMERON 1/2 tablet q hs prn   nitroGLYCERIN 0.4 MG SL tablet Commonly known as: NITROSTAT Place 1 tablet (0.4 mg total) under the tongue every 5 (five) minutes as needed for chest pain.   ondansetron 4 MG disintegrating tablet Commonly known as: ZOFRAN-ODT Take 4 mg by mouth 3 (three) times daily as needed for nausea or vomiting.   polyethylene glycol 17 g  packet Commonly known as: MIRALAX / GLYCOLAX Take 17 g by mouth daily as needed for mild constipation or moderate constipation.   PreserVision AREDS 2 Caps Take 1 capsule by mouth 2 (two) times daily.        Follow-up Information     Dale Hugo, MD. Schedule an appointment as soon as possible for a visit in 1 week(s).   Specialty: Internal Medicine Contact information: [address] [place] [address] [phone]                Discharge Exam: Ceasar Mons Weights   10/24/22 2145  Weight: 63 kg   General.  Frail elderly lady, in no acute distress. Pulmonary.  Lungs clear bilaterally, normal respiratory effort. CV.  Regular rate and rhythm, no JVD, rub or murmur. Abdomen.  Soft, nontender, nondistended, BS positive. CNS.  Alert and oriented to self only.  No focal neurologic deficit. Extremities.  No edema, no cyanosis, pulses intact and symmetrical. Psychiatry.  Judgment and insight appears impaired.  Condition at discharge: stable  The results of significant diagnostics from this hospitalization (including imaging, microbiology, ancillary and laboratory) are listed below for reference.   Imaging Studies: DG Chest Port 1 View  Result Date: 11/06/2022 CLINICAL DATA:  Fever EXAM: PORTABLE CHEST 1 VIEW COMPARISON:  October 27, 2022 FINDINGS: No pneumothorax. The cardiomediastinal silhouette is stable. No focal infiltrate or overt edema. No other acute abnormalities. No cause for fever identified. IMPRESSION: No active disease. Electronically Signed   By: Gerome Sam III M.D.   On: 11/06/2022 12:33   DG Chest Port 1 View  Result Date: 10/27/2022 CLINICAL DATA:  Fever, altered mental status. History of CHF, asthma EXAM: PORTABLE CHEST 1 VIEW COMPARISON:  Chest radiograph 10/24/2022 FINDINGS: The cardiomediastinal silhouette is stable. There has been no significant interval change in lung aeration. There is no new or worsening focal airspace  disease. There is no overt pulmonary edema. There is no pleural effusion or pneumothorax There is no acute osseous abnormality. IMPRESSION: No significant interval change since 10/24/2022. No new or worsening focal airspace disease. Electronically Signed   By: Lesia Hausen M.D.   On: 10/27/2022 16:19   DG Abd 1 View  Result Date: 10/25/2022 CLINICAL DATA:  Constipation EXAM: ABDOMEN - 1 VIEW COMPARISON:  CT abdomen and pelvis dated 12/26/2021 FINDINGS: Nonobstructive bowel gas pattern. No free air or pneumatosis. Moderate volume stool throughout the colon. No abnormal radio-opaque calculi or mass effect. No acute or substantial osseous abnormality. The sacrum and coccyx are partially obscured by overlying bowel contents. Right upper quadrant cholecystectomy clips. IMPRESSION: Nonobstructive bowel gas pattern. Moderate volume stool throughout the colon. Electronically Signed   By: Agustin Cree M.D.   On: 10/25/2022 11:07   CT Head Wo Contrast  Result Date: 10/25/2022 CLINICAL DATA:  Mental status change, unknown cause EXAM: CT HEAD WITHOUT CONTRAST TECHNIQUE: Contiguous axial images were obtained from the base of the skull through the vertex without intravenous contrast. RADIATION DOSE REDUCTION: This exam was performed according  to the departmental dose-optimization program which includes automated exposure control, adjustment of the mA and/or kV according to patient size and/or use of iterative reconstruction technique. COMPARISON:  04/01/2022 FINDINGS: Brain: Normal anatomic configuration. Parenchymal volume loss is commensurate with the patient's age. Moderate stable periventricular white matter changes are present likely reflecting the sequela of small vessel ischemia. 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 Remote appearing lacunar infarct noted within the right thalamus. No abnormal intra or extra-axial mass lesion or fluid collection. No abnormal  mass effect or midline shift. No evidence of acute intracranial hemorrhage or infarct. Ventricular size is normal. Cerebellum unremarkable. Vascular: No asymmetric hyperdense vasculature at the skull base. 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 Mucosal thickening within the maxillary sinuses is present without air-fluid levels identified. Remaining paranasal sinuses are clear. Orbits are unremarkable. Other: Mastoid air cells and middle ear cavities are clear. IMPRESSION: 1. No acute intracranial abnormality. 2. Stable senescent changes and remote infarcts. 3. Mild paranasal sinus disease. Electronically Signed   By: Helyn NumbersAshesh  Parikh M.D.   On: 10/25/2022 00:02   DG Chest 1 View  Result Date: 10/24/2022 CLINICAL DATA:  Cough EXAM: CHEST  1 VIEW COMPARISON:  10/10/2022 FINDINGS: Stable left basilar scarring. Discoid atelectasis at the right lung base. Lung volumes are small, but are stable since prior examination and are otherwise clear. No pneumothorax or pleural effusion. Cardiac size within normal limits. Pulmonary vascularity is normal. No acute bone abnormality. IMPRESSION: 1. Pulmonary hypoinflation. Electronically Signed   By: Helyn NumbersAshesh  Parikh M.D.   On: 10/24/2022 23:57    Microbiology: Results for orders placed or performed during the hospital encounter of 10/24/22  Urine Culture     Status: Abnormal   Collection Time: 10/24/22 10:31 PM   Specimen: Urine, Clean Catch  Result Value Ref Range Status   Specimen Description   Final    URINE, CLEAN CATCH Performed at [facility], [address] [place], [place] [address]    Special Requests   Final    NONE Performed at [facility], [address] [place], [place] [address]    Culture 50,000 COLONIES/mL ESCHERICHIA COLI (A)  Final   Report Status 10/27/2022 FINAL  Final   Organism ID, Bacteria ESCHERICHIA COLI (A)  Final      Susceptibility   Escherichia coli - MIC*     AMPICILLIN >=32 RESISTANT Resistant     CEFAZOLIN <=4 SENSITIVE Sensitive     CEFEPIME <=0.12 SENSITIVE Sensitive     CEFTRIAXONE <=0.25 SENSITIVE Sensitive     CIPROFLOXACIN 1 RESISTANT Resistant     GENTAMICIN <=1 SENSITIVE Sensitive     IMIPENEM <=0.25 SENSITIVE Sensitive     NITROFURANTOIN <=16 SENSITIVE Sensitive     TRIMETH/SULFA >=320 RESISTANT Resistant     AMPICILLIN/SULBACTAM >=32 RESISTANT Resistant     PIP/TAZO <=4 SENSITIVE Sensitive     * 50,000 COLONIES/mL ESCHERICHIA COLI  Resp panel by RT-PCR (RSV, Flu A&B, Covid) Anterior Nasal Swab     Status: None   Collection Time: 10/24/22 11:09 PM   Specimen: Anterior Nasal Swab  Result Value Ref Range Status   SARS Coronavirus 2 by RT PCR NEGATIVE NEGATIVE Final    Comment: (NOTE) SARS-CoV-2 target nucleic acids are NOT DETECTED.  The SARS-CoV-2 RNA is generally detectable in upper respiratory specimens during the acute phase of infection. The lowest concentration of SARS-CoV-2 viral copies this assay can detect is 138  copies/mL. A negative result does not preclude SARS-Cov-2 infection and should not be used as the sole basis for treatment or other patient management decisions. A negative result may occur with  improper specimen collection/handling, submission of specimen other than nasopharyngeal swab, presence of viral mutation(s) within the areas targeted by this assay, and inadequate number of viral copies(<138 copies/mL). A negative result must be combined with clinical observations, patient history, and epidemiological information. The expected result is Negative.  Fact Sheet for Patients:  BloggerCourse.com  Fact Sheet for Healthcare Providers:  SeriousBroker.it  This test is no t yet approved or cleared by the [place] FDA and  has been authorized for detection and/or diagnosis of SARS-CoV-2 by FDA under an Emergency Use Authorization (EUA). This EUA will  remain  in effect (meaning this test can be used) for the duration of the COVID-19 declaration under Section 564(b)(1) of the Act, 21 U.S.C.section 360bbb-3(b)(1), unless the authorization is terminated  or revoked sooner.       Influenza A by PCR NEGATIVE NEGATIVE Final   Influenza B by PCR NEGATIVE NEGATIVE Final    Comment: (NOTE) The Xpert Xpress SARS-CoV-2/FLU/RSV plus assay is intended as an aid in the diagnosis of influenza from Nasopharyngeal swab specimens and should not be used as a sole basis for treatment. Nasal washings and aspirates are unacceptable for Xpert Xpress SARS-CoV-2/FLU/RSV testing.  Fact Sheet for Patients: BloggerCourse.com  Fact Sheet for Healthcare Providers: SeriousBroker.it  This test is not yet approved or cleared by the [place] FDA and has been authorized for detection and/or diagnosis of SARS-CoV-2 by FDA under an Emergency Use Authorization (EUA). This EUA will remain in effect (meaning this test can be used) for the duration of the COVID-19 declaration under Section 564(b)(1) of the Act, 21 U.S.C. section 360bbb-3(b)(1), unless the authorization is terminated or revoked.     Resp Syncytial Virus by PCR NEGATIVE NEGATIVE Final    Comment: (NOTE) Fact Sheet for Patients: BloggerCourse.com  Fact Sheet for Healthcare Providers: SeriousBroker.it  This test is not yet approved or cleared by the [place] FDA and has been authorized for detection and/or diagnosis of SARS-CoV-2 by FDA under an Emergency Use Authorization (EUA). This EUA will remain in effect (meaning this test can be used) for the duration of the COVID-19 declaration under Section 564(b)(1) of the Act, 21 U.S.C. section 360bbb-3(b)(1), unless the authorization is terminated or revoked.  Performed at [facility], [address] [place], [place] [address]    Culture, blood (Routine X 2) w Reflex to ID Panel     Status: None   Collection Time: 10/27/22  5:26 PM   Specimen: BLOOD  Result Value Ref Range Status   Specimen Description BLOOD BLOOD LEFT WRIST  Final   Special Requests   Final    BOTTLES DRAWN AEROBIC AND ANAEROBIC Blood Culture adequate volume   Culture   Final    NO GROWTH 5 DAYS Performed at [facility], [address], [place], [place] [address]    Report Status 11/01/2022 FINAL  Final  Culture, blood (Routine X 2) w Reflex to ID Panel     Status: None   Collection Time: 10/27/22  5:37 PM   Specimen: BLOOD  Result Value Ref Range Status   Specimen Description BLOOD BLOOD RIGHT HAND  Final   Special Requests   Final    BOTTLES DRAWN AEROBIC AND ANAEROBIC Blood Culture adequate volume   Culture   Final  NO GROWTH 5 DAYS Performed at [facility], [address] [place], [place] [address]    Report Status 11/01/2022 FINAL  Final    Labs: CBC: No results for input(s): "WBC", "NEUTROABS", "HGB", "HCT", "MCV", "PLT" in the last 168 hours. Basic Metabolic Panel: No results for input(s): "NA", "K", "CL", "CO2", "GLUCOSE", "BUN", "CREATININE", "CALCIUM", "MG", "PHOS" in the last 168 hours. Liver Function Tests: No results for input(s): "AST", "ALT", "ALKPHOS", "BILITOT", "PROT", "ALBUMIN" in the last 168 hours. CBG: Recent Labs  Lab 11/13/22 0742 11/13/22 1128 11/13/22 1621 11/13/22 2024 11/14/22 0848  GLUCAP 161* 173* 163* 167* 161*    Discharge time spent: greater than 30 minutes.  This record has been created using Conservation officer, historic buildings. Errors have been sought and corrected,but may not always be located. Such creation errors do not reflect on the standard of care.   Signed: Arnetha Courser, MD [facility] 11/14/2022

## 2022-11-14 NOTE — TOC Transition Note (Signed)
Transition of Care [facility]) - CM/SW Discharge Note   Patient Details  Name: Tanya Cole MRN: [id number] Date of Birth: 1933-04-13  Transition of Care [facility] CM/SW Contact:  Gerilyn Pilgrim, LCSW Phone Number: 11/14/2022, 10:44 AM   Clinical Narrative:   Pt discharging today to [facility], DC summary sent to facility. Tanya with [facility] notified. [facility] called, pt is first in line. RN given number for report. Medical necessity forms printed to the unit. [place] notified.     Final next level of care: Skilled Nursing Facility Barriers to Discharge: Barriers Resolved   Patient Goals and CMS Choice CMS Medicare.gov Compare Post Acute Care list provided to:: Patient Represenative (must comment) ([place] [facility]) Choice offered to / list presented to : [place] / [place]  Discharge Placement                Patient chooses bed at: [facility] Patient to be transferred to facility by: [facility] Name of family member notified: Olin Hauser Patient and family notified of of transfer: 11/14/22  Discharge Plan and Services Additional resources added to the After Visit Summary for                                       Social Determinants of Health (SDOH) Interventions SDOH Screenings   Food Insecurity: No Food Insecurity (10/26/2022)  Housing: Low Risk  (10/26/2022)  Transportation Needs: No Transportation Needs (10/26/2022)  Utilities: Not At Risk (10/26/2022)  Alcohol Screen: Low Risk  (05/25/2022)  Depression (PHQ2-9): High Risk (10/18/2022)  Financial Resource Strain: Low Risk  (05/27/2022)  Physical Activity: Insufficiently Active (01/07/2019)  Social Connections: Moderately Isolated (05/27/2022)  Stress: No Stress Concern Present (05/27/2022)  Tobacco Use: Low Risk  (11/01/2022)     Readmission Risk Interventions    05/30/2022   10:28 AM 05/08/2022   10:08 AM 05/14/2021    9:26 AM  Readmission Risk Prevention Plan  Transportation Screening Complete Complete Complete   PCP or Specialist Appt within 5-7 Days   Complete  Home Care Screening   Complete  Medication Review (RN CM)   Referral to Pharmacy  Medication Review (RN Care Manager) Complete Complete   PCP or Specialist appointment within 3-5 days of discharge  Complete   HRI or Home Care Consult Complete Complete   SW Recovery Care/Counseling Consult Complete Complete   Palliative Care Screening Not Applicable Not Applicable   Skilled Nursing Facility  Complete

## 2022-11-15 ENCOUNTER — Ambulatory Visit: Payer: Medicare Other | Admitting: Cardiovascular Disease

## 2022-11-15 DIAGNOSIS — R51 Headache with orthostatic component, not elsewhere classified: Secondary | ICD-10-CM | POA: Diagnosis not present

## 2022-11-15 DIAGNOSIS — R52 Pain, unspecified: Secondary | ICD-10-CM | POA: Diagnosis not present

## 2022-11-15 DIAGNOSIS — F03918 Unspecified dementia, unspecified severity, with other behavioral disturbance: Secondary | ICD-10-CM | POA: Diagnosis not present

## 2022-11-15 DIAGNOSIS — J449 Chronic obstructive pulmonary disease, unspecified: Secondary | ICD-10-CM | POA: Diagnosis not present

## 2022-11-15 DIAGNOSIS — I10 Essential (primary) hypertension: Secondary | ICD-10-CM | POA: Diagnosis not present

## 2022-11-15 DIAGNOSIS — G9341 Metabolic encephalopathy: Secondary | ICD-10-CM | POA: Diagnosis not present

## 2022-11-15 DIAGNOSIS — R9431 Abnormal electrocardiogram [ECG] [EKG]: Secondary | ICD-10-CM | POA: Diagnosis not present

## 2022-11-15 DIAGNOSIS — E559 Vitamin D deficiency, unspecified: Secondary | ICD-10-CM | POA: Diagnosis not present

## 2022-11-15 DIAGNOSIS — I4891 Unspecified atrial fibrillation: Secondary | ICD-10-CM | POA: Diagnosis not present

## 2022-11-15 DIAGNOSIS — E039 Hypothyroidism, unspecified: Secondary | ICD-10-CM | POA: Diagnosis not present

## 2022-11-15 DIAGNOSIS — E1159 Type 2 diabetes mellitus with other circulatory complications: Secondary | ICD-10-CM | POA: Diagnosis not present

## 2022-11-15 DIAGNOSIS — M353 Polymyalgia rheumatica: Secondary | ICD-10-CM | POA: Diagnosis not present

## 2022-11-15 DIAGNOSIS — K5900 Constipation, unspecified: Secondary | ICD-10-CM | POA: Diagnosis not present

## 2022-11-16 DIAGNOSIS — N390 Urinary tract infection, site not specified: Secondary | ICD-10-CM | POA: Diagnosis not present

## 2022-11-16 DIAGNOSIS — F03918 Unspecified dementia, unspecified severity, with other behavioral disturbance: Secondary | ICD-10-CM | POA: Diagnosis not present

## 2022-11-17 ENCOUNTER — Non-Acute Institutional Stay: Payer: 59 | Admitting: Nurse Practitioner

## 2022-11-17 ENCOUNTER — Encounter: Payer: Self-pay | Admitting: Internal Medicine

## 2022-11-17 VITALS — BP 152/84 | HR 88 | Temp 97.3°F | Resp 18 | Wt 125.4 lb

## 2022-11-17 DIAGNOSIS — F03918 Unspecified dementia, unspecified severity, with other behavioral disturbance: Secondary | ICD-10-CM

## 2022-11-17 DIAGNOSIS — N390 Urinary tract infection, site not specified: Secondary | ICD-10-CM | POA: Diagnosis not present

## 2022-11-17 DIAGNOSIS — R5381 Other malaise: Secondary | ICD-10-CM | POA: Diagnosis not present

## 2022-11-17 DIAGNOSIS — Z515 Encounter for palliative care: Secondary | ICD-10-CM

## 2022-11-17 DIAGNOSIS — W19XXXA Unspecified fall, initial encounter: Secondary | ICD-10-CM | POA: Diagnosis not present

## 2022-11-17 NOTE — Progress Notes (Signed)
Therapist, nutritional Palliative Care Consult Note Telephone: [phone]  Fax: [phone]   Date of encounter: 11/17/22 7:27 PM PATIENT NAME: Tanya Cole [address] [place] [address]   [phone] (home)  DOB: 1932-11-30 MRN: [id number] PRIMARY CARE PROVIDER:   [facility] Protivin, Westley Hummer, MD,  [address] [place] [address] [phone]  RESPONSIBLE PARTY:    Contact Information     Name Relation Home Work [place] Daughter [phone]   johnson,jennifer Daughter   [phone]   Calais,Monica Relative [phone]  [phone]   Carron, Mcmurry [phone]  [phone]   Alvia Grove   [phone]      I met face to face with patient in facility. Palliative Care was asked to follow this patient by consultation request of  Dale Wrangell, MD/[facility] to address advance care planning and complex medical decision making. This is the initial visit.        ASSESSMENT AND PLAN / RECOMMENDATIONS:  Symptom Management/Plan: 1. Advance Care Planning;  DNR 2. Goals of Care: Goals include to maximize quality of life and symptom management. Our advance care planning conversation included a discussion about:    The value and importance of advance care planning  Exploration of personal, cultural or spiritual beliefs that might influence medical decisions  Exploration of goals of care in the event of a sudden injury or illness  Identification and preparation of a healthcare agent  Review and updating or creation of an advance directive document. 3. Palliative care encounter; Palliative care encounter; Palliative medicine team will continue to support patient, patient's family, and medical team. Visit consisted of counseling and education dealing with the complex and emotionally intense issues of symptom management and palliative care  in the setting of serious and potentially life-threatening illness  4. Debility secondary to Dementia, decompensation with recent hospitalization, continue to encourage to participate in therapy, continue redirect with behaviors, continue current medications for behaviors. Psych if needed. Will f/u with family about goc.  Follow up Palliative Care Visit: Palliative care will continue to follow for complex medical decision making, advance care planning, and clarification of goals. Return 1 to 4 weeks or prn.  I spent 47 minutes providing this consultation. More than 50% of the time in this consultation was spent in counseling and care coordination. PPS: 50%  Chief Complaint: Initial palliative consult for complex medical decision making, address goals, manage ongoing symptoms  HISTORY OF PRESENT ILLNESS:  Tanya Cole is a 89 y.o. year old female  with multiple medical problems including Dementia with behaviors, polymyalgia rheumatica syndrome, h/o steroid-induced psychosis with hallucinations, DM, CAD, HTN, COPD, Afib, diastolic CHF. [name] was hospitalized 10/24/2022 to 11/14/2022 for acute metabolic encephalopathy, crying, visual hallucinations, crying, confusion. Workup significant for UTI with h/o steroid-induced psychosis though not currently on steroids for PMR. [name] was stabilized and d/c to [facility] for STR where she currently resides. [name] is at risk for falls and often requires reminders to stay in the chair. [name] does stand to pivot with assistance. [name] does require assistance for bathing, prompting for dressing, feeds herself with good appetite. [name] is a DNR. At present [name] is sitting in PT throwing a bean bag in a basket, most of the time missing the basket. Asked numerous times to slide back in the w/c. Frequent re-direction which she does follow when prompted. [name] was cooperative with assessment, interactive  though difficulty holding her attention. Ms  Marcotte was cooperative with assessment. No meaningful discussion with cognitive impairment. Medical goals, medications, poc reviewed. I attempted to contact dtg, will continue for further discussion of goc. Updated staff, no new changes today, will f/u for monitoring progress, functional/cognitive decline overall. Supportive visit, updated staff.   History obtained from review of EMR, discussion with primary team, and interview with family, facility staff/caregiver and/or Ms. Cavallero.  I reviewed available labs, medications, imaging, studies and related documents from the EMR.  Records reviewed and summarized above.   Physical Exam: Constitutional: NAD General: elderly pleasantly confused female, oriented to self ENMT: oral mucous membranes moist CV: S1S2, RRR Pulmonary: LCTA, no increased work of breathing, no cough, room air Abdomen: soft and non tender MSK: w/c dependent Skin: warm and dry Neuro:  + generalized weakness,  + cognitive impairment Psych: non-anxious affect, A and Oriented to self CURRENT PROBLEM LIST:  Patient Active Problem List   Diagnosis Date Noted   Prolonged QT interval 10/25/2022   Dementia with behavioral disturbance ([place]) 10/25/2022   Delirium 10/25/2022   Breast lesion 10/19/2022   Nasal congestion 09/03/2022   Sleep difficulties 06/12/2022   Tenderness of left calf 06/08/2022   Abnormal urinalysis 05/30/2022   Community acquired pneumonia 05/29/2022   Discharge planning issues 05/22/2022   Atrial fibrillation ([place]) 05/19/2022   Hallucinations 05/06/2022   History of Steroid-induced psychosis, with hallucinations ([place]) 61/95/0932   Acute metabolic encephalopathy 67/10/4579   Paroxysmal atrial fibrillation with RVR ([place]) 04/02/2022   CAD (coronary artery disease) 01/25/2022   Polyarthritis 10/31/2021   COPD (chronic obstructive pulmonary disease) ([place]) 05/21/2021   Hypomagnesemia 05/14/2021   Hypokalemia    Acute exacerbation of chronic obstructive  pulmonary disease (COPD) ([place]) 03/01/2021   Chronic diastolic CHF (congestive heart failure) ([place]) 11/29/2020   CAP (community acquired pneumonia) 11/29/2020   Abnormal CXR 10/19/2020   Type 2 diabetes mellitus with cardiac complication ([place]) 99/83/3825   COPD with acute exacerbation ([place]) 08/19/2020   Leukocytosis 08/07/2020   Iron deficiency 04/16/2020   Anemia 04/04/2020   Coronary artery disease of native heart with stable angina pectoris ([place]) 12/29/2018   Memory change 05/27/2018   Osteoporosis 02/05/2018   Polymyalgia rheumatica syndrome ([place]) 05/16/2016   Acute UTI 07/07/2014   Diverticulitis 02/24/2013   Reactive airway disease 09/15/2012   Hypertension 09/14/2012   Hypercholesterolemia 09/14/2012   Diabetes mellitus ([place]) 09/14/2012   PAST MEDICAL HISTORY:  Active Ambulatory Problems    Diagnosis Date Noted   Hypertension 09/14/2012   Hypercholesterolemia 09/14/2012   Diabetes mellitus ([place]) 09/14/2012   Reactive airway disease 09/15/2012   Diverticulitis 02/24/2013   Acute UTI 07/07/2014   Polymyalgia rheumatica syndrome ([place]) 05/16/2016   Osteoporosis 02/05/2018   Memory change 05/27/2018   Coronary artery disease of native heart with stable angina pectoris ([place]) 12/29/2018   Anemia 04/04/2020   Iron deficiency 04/16/2020   Leukocytosis 08/07/2020   COPD with acute exacerbation ([employer]) 08/19/2020   Type 2 diabetes mellitus with cardiac complication ([place]) 05/39/7673   Abnormal CXR 10/19/2020   Chronic diastolic CHF (congestive heart failure) ([place]) 11/29/2020   CAP (community acquired pneumonia) 11/29/2020   Acute exacerbation of chronic obstructive pulmonary disease (COPD) ([place]) 03/01/2021   Hypokalemia    Hypomagnesemia 05/14/2021   COPD (chronic obstructive pulmonary disease) ([place]) 05/21/2021   Polyarthritis 10/31/2021   CAD (coronary artery disease) 41/93/7902   Acute metabolic encephalopathy 40/97/3532   Paroxysmal atrial fibrillation with RVR ([place])  04/02/2022  History of Steroid-induced psychosis, with hallucinations ([place]) 04/03/2022   Hallucinations 05/06/2022   Atrial fibrillation ([place]) 05/19/2022   Discharge planning issues 05/22/2022   Community acquired pneumonia 05/29/2022   Abnormal urinalysis 05/30/2022   Tenderness of left calf 06/08/2022   Sleep difficulties 06/12/2022   Nasal congestion 09/03/2022   Breast lesion 10/19/2022   Prolonged QT interval 10/25/2022   Dementia with behavioral disturbance ([place]) 10/25/2022   Delirium 10/25/2022   Resolved Ambulatory Problems    Diagnosis Date Noted   Neuropathy 03/24/2014   Health care maintenance 01/25/2015   Fatigue 05/24/2015   Back pain 04/29/2016   Elevated erythrocyte sedimentation rate 05/04/2016   Long term current use of systemic steroids 05/16/2016   Abdominal pain, left lower quadrant 10/05/2016   Weight loss 06/24/2017   Chest tightness 10/07/2018   Respiratory illness 06/18/2019   Cholecystitis 08/05/2019   Choledocholithiasis    Pre-op evaluation 08/30/2019   Gallstone pancreatitis    Cough 09/30/2019   SOB (shortness of breath) 03/25/2020   Left shoulder pain 07/12/2020   History of non-ST elevation myocardial infarction (NSTEMI) 08/07/2020   Asthma 08/07/2020   Elevated troponin 10/08/2020   Acute on chronic heart failure with preserved ejection fraction (HFpEF) ([place]) 10/08/2020   Rib pain on right side 10/19/2020   COPD exacerbation ([place]) 11/29/2020   Calcified granuloma of lung ([place]) 03/02/2021   Joint ache 04/09/2021   CAD (coronary artery disease)    Severe sepsis ([place])    Acute respiratory failure with hypoxia (HCC)    Asthma exacerbation    History of COVID-19 06/27/2021   Dizziness 10/26/2021   Limited mobility    Wrist pain 11/29/2021   Severe sepsis ([place]) 01/25/2022   Past Medical History:  Diagnosis Date   (HFpEF) heart failure with preserved ejection fraction (HCC)    CHF (congestive heart failure) (HCC)    Myocardial  infarction (Tanya Riding)    Osteopenia    Palpitations    SOCIAL HX:  Social History   Tobacco Use   Smoking status: Never   Smokeless tobacco: Never  Substance Use Topics   Alcohol use: No    Alcohol/week: 0.0 standard drinks of alcohol   FAMILY HX:  Family History  Problem Relation Age of Onset   Arthritis Mother    Heart disease Mother    Heart attack Father    Throat cancer Sister    Parkinson's disease Sister    COPD Brother    COPD Brother       ALLERGIES:  Allergies  Allergen Reactions   Tramadol Itching and Nausea And Vomiting     PERTINENT MEDICATIONS:  Outpatient Encounter Medications as of 11/17/2022  Medication Sig   acetaminophen (TYLENOL) 500 MG tablet Take 500-1,000 mg by mouth every 6 (six) hours as needed for mild pain or moderate pain.   Albuterol Sulfate (PROAIR RESPICLICK) 283 (90 Base) MCG/ACT AEPB Inhale into the lungs every 4 (four) hours. PRN   amLODipine (NORVASC) 2.5 MG tablet Take 1 tablet by mouth once daily   aspirin 81 MG tablet Take 81 mg by mouth daily.   atorvastatin (LIPITOR) 80 MG tablet TAKE 1 TABLET BY MOUTH ONCE DAILY AT  6PM   cholecalciferol (VITAMIN D3) 25 MCG (1000 UNIT) tablet Take 1,000 Units by mouth daily.   diclofenac Sodium (VOLTAREN) 1 % GEL Apply 2 g topically 3 (three) times daily as needed (Leg pain).   divalproex (DEPAKOTE SPRINKLE) 125 MG capsule Take 1 capsule (125 mg total) by mouth  every 12 (twelve) hours.   donepezil (ARICEPT) 5 MG tablet Take by mouth.   folic acid (FOLVITE) 1 MG tablet Take 1 mg by mouth daily.   furosemide (LASIX) 40 MG tablet Take 1 tablet (40 mg total) by mouth daily as needed (weight gain 5 lbs over 1-2 days, lower extremity swelling, shortness of breath. If medication does not resolve symptoms in 2 days please call Dr / 911).   gabapentin (NEURONTIN) 100 MG capsule Take 100 mg by mouth at bedtime.   ipratropium-albuterol (DUONEB) 0.5-2.5 (3) MG/3ML SOLN Take 3 mLs by nebulization 2 (two) times daily  as needed (shortness of breath or wheezing).   metFORMIN (GLUCOPHAGE) 500 MG tablet TAKE 1 TABLET BY MOUTH TWICE DAILY WITH A MEAL   methotrexate (RHEUMATREX) 2.5 MG tablet Take 12.5 mg by mouth every Saturday.   metoprolol tartrate (LOPRESSOR) 25 MG tablet Take 1 tablet (25 mg total) by mouth 2 (two) times daily.   mirtazapine (REMERON) 7.5 MG tablet 1/2 tablet q hs prn   Multiple Vitamins-Minerals (PRESERVISION AREDS 2) CAPS Take 1 capsule by mouth 2 (two) times daily.   nitroGLYCERIN (NITROSTAT) 0.4 MG SL tablet Place 1 tablet (0.4 mg total) under the tongue every 5 (five) minutes as needed for chest pain.   ondansetron (ZOFRAN-ODT) 4 MG disintegrating tablet Take 4 mg by mouth 3 (three) times daily as needed for nausea or vomiting. (Patient not taking: Reported on 10/25/2022)   polyethylene glycol (MIRALAX / GLYCOLAX) 17 g packet Take 17 g by mouth daily as needed for mild constipation or moderate constipation.   No facility-administered encounter medications on file as of 11/17/2022.   Thank you for the opportunity to participate in the care of Ms. Johal.  The palliative care team will continue to follow. Please call our office at [phone] if we can be of additional assistance.   Kelseigh Diver Z Jossie Smoot, NP ,

## 2022-11-18 ENCOUNTER — Encounter: Payer: Self-pay | Admitting: Nurse Practitioner

## 2022-11-18 ENCOUNTER — Non-Acute Institutional Stay: Payer: 59 | Admitting: Nurse Practitioner

## 2022-11-18 VITALS — BP 136/72 | HR 78 | Temp 97.1°F | Resp 18 | Wt 125.4 lb

## 2022-11-18 DIAGNOSIS — F03918 Unspecified dementia, unspecified severity, with other behavioral disturbance: Secondary | ICD-10-CM | POA: Diagnosis not present

## 2022-11-18 DIAGNOSIS — N390 Urinary tract infection, site not specified: Secondary | ICD-10-CM | POA: Diagnosis not present

## 2022-11-18 DIAGNOSIS — Z515 Encounter for palliative care: Secondary | ICD-10-CM | POA: Diagnosis not present

## 2022-11-18 DIAGNOSIS — R5381 Other malaise: Secondary | ICD-10-CM

## 2022-11-18 NOTE — Progress Notes (Addendum)
[facility]er[facility] Palliative Care Consult Note Telephone: [phone]  Fax: [phone]    Date of encounter: 11/18/22 12:10 PM PATIENT NAME: Tanya Cole [name] [address]   [phone] (home)  DOB: 07-17-1933 MRN: [id number] PRIMARY CARE PROVIDER:   [place], MD,  [address] [place] [address] [phone]  RESPONSIBLE PARTY:    Contact Information     Name Relation Home Work [place] Daughter [phone]   johnson,jennifer Daughter   [phone]   Steege,Monica Relative [phone]  [phone]   Ahniya, Mitchum [phone]  [phone]   [name]   [phone]     I met face to face with patient in facility. Palliative Care was asked to follow this patient by consultation request of  Einar Pheasant, MD/[facility] to address advance care planning and complex medical decision making. This is the initial visit.        ASSESSMENT AND PLAN / RECOMMENDATIONS:  Symptom Management/Plan: 1. Advance Care Planning;  DNR 2. Goals of Care: Goals include to maximize quality of life and symptom management. Our advance care planning conversation included a discussion about:    The value and importance of advance care planning  Exploration of personal, cultural or spiritual beliefs that might influence medical decisions  Exploration of goals of care in the event of a sudden injury or illness  Identification and preparation of a healthcare agent  Review and updating or creation of an advance directive document. 3. Palliative care encounter; Palliative care encounter; Palliative medicine team will continue to support patient, patient's family, and medical team. Visit consisted of counseling and education dealing with the complex and emotionally intense issues of symptom management and palliative  care in the setting of serious and potentially life-threatening illness   4. Debility secondary to Dementia, decompensation with recent hospitalization, continue to encourage to participate in therapy, continue redirect with behaviors, continue current medications for behaviors. Psych if needed. Will f/u with family about goc.  12/19/204 weight 139.2 lbs 11/14/2022 weight 125.4 lbs 13.8 lbs/4 weeks; 9.91%   Follow up Palliative Care Visit: Palliative care will continue to follow for complex medical decision making, advance care planning, and clarification of goals. Return 1 to 4 weeks or prn.   I spent 45 minutes providing this consultation started at 11:00 am. More than 50% of the time in this consultation was spent in counseling and care coordination. PPS: 50%   Chief Complaint: Initial palliative consult for complex medical decision making, address goals, manage ongoing symptoms   HISTORY OF PRESENT ILLNESS:  Tanya Cole is a 89 y.o. year old female  with multiple medical problems including Dementia with behaviors, polymyalgia rheumatica syndrome, h/o steroid-induced psychosis with hallucinations, DM, CAD, HTN, COPD, Afib, diastolic CHF. Tanya Cole was hospitalized 10/24/2022 to 11/14/2022 for acute metabolic encephalopathy, crying, visual hallucinations, crying, confusion. Workup significant for UTI with h/o steroid-induced psychosis though not currently on steroids for PMR. Tanya Cole was stabilized and d/c to [facility] for STR where she currently resides. Tanya Cole is at risk for falls and often requires reminders to stay in the chair. Tanya Cole does stand to pivot with assistance. Tanya Cole does require assistance for bathing, prompting for dressing, feeds herself with good appetite. Tanya Cole is a DNR. At present Tanya Cole is sitting in front of the nurses station, yelling with intermit crying, redirected with verbal interactions. Supportive visit.  Tanya Cole was cooperative with assessment. No  meaningful discussion with cognitive impairment. Medical goals, medications, poc reviewed.   Updated staff, no new changes today, will f/u for monitoring progress, functional/cognitive decline overall. Supportive visit, updated staff.   11/22/2022 I called daughter, Tanya Cole, update given, discussed pc visit, weights, appetite, progressive chronic disease changes, transitioning to ltc, therapy with str, functional, cognitive changes. We talked about prior to hospitalization at home. We talked about overall decline, medical goals, poc, role pc  in [place]. Questions answered, contact information provided.    History obtained from review of EMR, discussion with primary team, and interview with family, facility staff/caregiver and/or Tanya. Cole.  I reviewed available labs, medications, imaging, studies and related documents from the EMR.  Records reviewed and summarized above.    Physical Exam: Constitutional: NAD General: elderly pleasantly confused female, oriented to self ENMT: oral mucous membranes moist CV: S1S2, RRR Pulmonary: LCTA, no increased work of breathing, no cough, room air Abdomen: soft and non tender MSK: w/c dependent Skin: warm and dry Neuro:  + generalized weakness,  + cognitive impairment Psych: tearful, A and Oriented to self  Thank you for the opportunity to participate in the care of Tanya Cole. Please call our office at [phone] if we can be of additional assistance.   Katharine Rochefort Ihor Gully, NP

## 2022-11-21 ENCOUNTER — Telehealth: Payer: Self-pay | Admitting: Nurse Practitioner

## 2022-11-21 DIAGNOSIS — N390 Urinary tract infection, site not specified: Secondary | ICD-10-CM | POA: Diagnosis not present

## 2022-11-21 DIAGNOSIS — R195 Other fecal abnormalities: Secondary | ICD-10-CM | POA: Diagnosis not present

## 2022-11-21 DIAGNOSIS — I10 Essential (primary) hypertension: Secondary | ICD-10-CM | POA: Diagnosis not present

## 2022-11-21 NOTE — Telephone Encounter (Signed)
I called and left a message for Tanya Berneda Rose, Tanya Dols dtg for update on Tanya Cole, pc visit, contact information left to return call.

## 2022-11-22 ENCOUNTER — Telehealth: Payer: Self-pay

## 2022-11-22 DIAGNOSIS — I10 Essential (primary) hypertension: Secondary | ICD-10-CM | POA: Diagnosis not present

## 2022-11-22 DIAGNOSIS — F03918 Unspecified dementia, unspecified severity, with other behavioral disturbance: Secondary | ICD-10-CM | POA: Diagnosis not present

## 2022-11-22 DIAGNOSIS — I4891 Unspecified atrial fibrillation: Secondary | ICD-10-CM | POA: Diagnosis not present

## 2022-11-22 DIAGNOSIS — E1159 Type 2 diabetes mellitus with other circulatory complications: Secondary | ICD-10-CM | POA: Diagnosis not present

## 2022-11-22 NOTE — Telephone Encounter (Signed)
Called and left message for [place] her for update.  Informed to let us know if needs anything and to keep [place] posted.

## 2022-11-22 NOTE — Telephone Encounter (Signed)
Terry Abila called to update Dr. Einar Pheasant on patient's situation.  Brayton Layman states patient is now in [facility] and this is her second week there.  Monica states patient's dementia has gotten worse.  Monica states palliative nurse states patient's weight is now 120 pounds and she is not eating well.  Brayton Layman states patient has lost 20 pounds since Christmas.  Monica states Dr. Nicki Reaper is more than welcome to call her if she would like.

## 2022-11-23 DIAGNOSIS — M810 Age-related osteoporosis without current pathological fracture: Secondary | ICD-10-CM | POA: Diagnosis not present

## 2022-11-23 DIAGNOSIS — I2510 Atherosclerotic heart disease of native coronary artery without angina pectoris: Secondary | ICD-10-CM | POA: Diagnosis not present

## 2022-11-23 DIAGNOSIS — I5032 Chronic diastolic (congestive) heart failure: Secondary | ICD-10-CM | POA: Diagnosis not present

## 2022-11-23 NOTE — Telephone Encounter (Signed)
Noted  

## 2022-11-24 DIAGNOSIS — N390 Urinary tract infection, site not specified: Secondary | ICD-10-CM | POA: Diagnosis not present

## 2022-11-28 ENCOUNTER — Encounter: Payer: Self-pay | Admitting: Internal Medicine

## 2022-11-28 DIAGNOSIS — R0902 Hypoxemia: Secondary | ICD-10-CM | POA: Diagnosis not present

## 2022-11-28 DIAGNOSIS — I10 Essential (primary) hypertension: Secondary | ICD-10-CM | POA: Diagnosis not present

## 2022-11-29 ENCOUNTER — Encounter: Payer: Self-pay | Admitting: Internal Medicine

## 2022-11-29 DIAGNOSIS — J449 Chronic obstructive pulmonary disease, unspecified: Secondary | ICD-10-CM | POA: Diagnosis not present

## 2022-11-30 DIAGNOSIS — J8410 Pulmonary fibrosis, unspecified: Secondary | ICD-10-CM | POA: Diagnosis not present

## 2022-11-30 DIAGNOSIS — I4891 Unspecified atrial fibrillation: Secondary | ICD-10-CM | POA: Diagnosis not present

## 2022-11-30 DIAGNOSIS — E1159 Type 2 diabetes mellitus with other circulatory complications: Secondary | ICD-10-CM | POA: Diagnosis not present

## 2022-11-30 DIAGNOSIS — F03918 Unspecified dementia, unspecified severity, with other behavioral disturbance: Secondary | ICD-10-CM | POA: Diagnosis not present

## 2022-11-30 DIAGNOSIS — I10 Essential (primary) hypertension: Secondary | ICD-10-CM | POA: Diagnosis not present

## 2022-12-01 ENCOUNTER — Encounter: Payer: Self-pay | Admitting: Internal Medicine

## 2022-12-01 DIAGNOSIS — J449 Chronic obstructive pulmonary disease, unspecified: Secondary | ICD-10-CM | POA: Diagnosis not present

## 2022-12-01 DIAGNOSIS — N390 Urinary tract infection, site not specified: Secondary | ICD-10-CM | POA: Diagnosis not present

## 2022-12-01 DIAGNOSIS — M6281 Muscle weakness (generalized): Secondary | ICD-10-CM | POA: Diagnosis not present

## 2022-12-02 ENCOUNTER — Encounter: Payer: Self-pay | Admitting: Nurse Practitioner

## 2022-12-02 ENCOUNTER — Encounter: Payer: Self-pay | Admitting: Internal Medicine

## 2022-12-02 ENCOUNTER — Non-Acute Institutional Stay: Payer: 59 | Admitting: Nurse Practitioner

## 2022-12-02 DIAGNOSIS — R5381 Other malaise: Secondary | ICD-10-CM | POA: Diagnosis not present

## 2022-12-02 DIAGNOSIS — Z515 Encounter for palliative care: Secondary | ICD-10-CM | POA: Diagnosis not present

## 2022-12-02 DIAGNOSIS — D72829 Elevated white blood cell count, unspecified: Secondary | ICD-10-CM | POA: Diagnosis not present

## 2022-12-02 DIAGNOSIS — F03918 Unspecified dementia, unspecified severity, with other behavioral disturbance: Secondary | ICD-10-CM

## 2022-12-02 DIAGNOSIS — E8809 Other disorders of plasma-protein metabolism, not elsewhere classified: Secondary | ICD-10-CM | POA: Diagnosis not present

## 2022-12-02 NOTE — Progress Notes (Signed)
[facility] Consult Note Telephone: [phone]  Fax: [phone]    Date of encounter: 12/02/22 6:43 PM PATIENT NAME: Tanya Cole   [phone] (home)  DOB: [birth year] MRN: [id number] PRIMARY CARE PROVIDER:    [facility]  RESPONSIBLE PARTY:    Contact Information     Name Relation Home Work [place] J Daughter [phone]   Cole,Tanya Daughter   [phone]   Cole,Tanya Relative [phone]  [phone]   Cole, Tanya [phone]  [phone]   Tanya Cole   [phone]     I met face to face with patient in facility. Palliative Care was asked to follow this patient by consultation request of  Tanya Pheasant, MD/[place] to address advance care planning and complex medical decision making. This is the initial visit.        ASSESSMENT AND PLAN / RECOMMENDATIONS:  Symptom Management/Plan: 1. Advance Care Planning;  DNR 2. Goals of Care: Goals include to maximize quality of life and symptom management. Our advance care planning conversation included a discussion about:    The value and importance of advance care planning  Exploration of personal, cultural or spiritual beliefs that might influence medical decisions  Exploration of goals of care in the event of a sudden injury or illness  Identification and preparation of a healthcare agent  Review and updating or creation of an advance directive document. 3. Palliative care encounter; Palliative care encounter; Palliative medicine team will continue to support patient, patient's family, and medical team. Visit consisted of counseling and education dealing with the complex and emotionally intense issues of symptom management and palliative care in the setting of serious and potentially life-threatening illness   4. Debility secondary  to Dementia, decompensation with recent hospitalization, continue to encourage to participate in therapy, continue redirect with behaviors, continue current medications for behaviors. Psych if needed. Will f/u with family about goc.   12/19/204 weight 139.2 lbs 11/14/2022 weight 125.4 lbs 13.8 lbs/4 weeks; 9.91%   Follow up Palliative Care Visit: Palliative care will continue to follow for complex medical decision making, advance care planning, and clarification of goals. Return 1 to 4 weeks or prn.   I spent 2minutes providing this consultation started at 11:00 am. More than 50% of the time in this consultation was spent in counseling and care coordination. PPS: 50%   Chief Complaint: Initial palliative consult for complex medical decision making, address goals, manage ongoing symptoms   HISTORY OF PRESENT ILLNESS:  Tanya Cole is a 89 y.o. year old female  with multiple medical problems including Dementia with behaviors, polymyalgia rheumatica syndrome, h/o steroid-induced psychosis with hallucinations, DM, CAD, HTN, COPD, Afib, diastolic CHF. Tanya Cole was hospitalized 10/24/2022 to 11/14/2022 for acute metabolic encephalopathy, crying, visual hallucinations, crying, confusion. Workup significant for UTI with h/o steroid-induced psychosis though not currently on steroids for PMR. Tanya Cole was stabilized and d/c to [facility] for STR where she currently resides. Tanya Cole is at risk for falls and often requires reminders to stay in the chair. Tanya Cole does stand to pivot with assistance. Tanya Cole does require assistance for bathing, prompting for dressing, feeds herself with good appetite. Tanya Cole is a DNR. Purpose of today PC f/u visit further discussion monitor trends of appetite, weights, monitor for functional, cognitive decline with chronic disease progression, assess any active symptoms, supportive role. At present Tanya Cole is lying in her  bed, yelling out, able to redirect and engage in simple  discussion. Tanya Cole did make eye contact, though limited and no meaningful discussion with cognitive impairment. Tanya Cole was cooperative with assessment. Attempted to reach dtg for update on pc visit. Supportive role. Medications, goc, poc, reviewed. PC f/u visit further discussion monitor trends of appetite, weights, monitor for functional, cognitive decline with chronic disease progression, assess any active symptoms, supportive role. Updated staff, no new changes today   History obtained from review of EMR, discussion with primary team, and interview with family, facility staff/caregiver and/or Tanya. Cole.  I reviewed available labs, medications, imaging, studies and related documents from the EMR.  Records reviewed and summarized above.    Physical Exam: Constitutional: NAD General: 89-year-old pleasantly confused female, oriented to self ENMT: oral mucous membranes moist CV: S1S2, RRR Pulmonary: LCTA, no increased work of breathing, no cough, room air Abdomen: soft and non tender MSK: w/c dependent Skin: warm and dry Neuro:  + generalized weakness,  + cognitive impairment Psych: tearful, A and confused   Thank you for the opportunity to participate in the care of Tanya Cole. Please call our office at [phone] if we can be of additional assistance.   Kresta Templeman Ihor Gully, NP

## 2022-12-05 DIAGNOSIS — Z0189 Encounter for other specified special examinations: Secondary | ICD-10-CM | POA: Diagnosis not present

## 2022-12-05 DIAGNOSIS — N390 Urinary tract infection, site not specified: Secondary | ICD-10-CM | POA: Diagnosis not present

## 2022-12-12 DIAGNOSIS — I10 Essential (primary) hypertension: Secondary | ICD-10-CM | POA: Diagnosis not present

## 2022-12-13 DIAGNOSIS — R7989 Other specified abnormal findings of blood chemistry: Secondary | ICD-10-CM | POA: Diagnosis not present

## 2022-12-16 ENCOUNTER — Encounter: Payer: Medicare Other | Admitting: Internal Medicine

## 2022-12-16 DIAGNOSIS — Z79899 Other long term (current) drug therapy: Secondary | ICD-10-CM | POA: Diagnosis not present

## 2022-12-20 DIAGNOSIS — E1159 Type 2 diabetes mellitus with other circulatory complications: Secondary | ICD-10-CM | POA: Diagnosis not present

## 2022-12-20 DIAGNOSIS — I10 Essential (primary) hypertension: Secondary | ICD-10-CM | POA: Diagnosis not present

## 2022-12-20 DIAGNOSIS — F03918 Unspecified dementia, unspecified severity, with other behavioral disturbance: Secondary | ICD-10-CM | POA: Diagnosis not present

## 2022-12-20 DIAGNOSIS — L22 Diaper dermatitis: Secondary | ICD-10-CM | POA: Diagnosis not present

## 2022-12-21 DIAGNOSIS — B379 Candidiasis, unspecified: Secondary | ICD-10-CM | POA: Diagnosis not present

## 2022-12-22 ENCOUNTER — Non-Acute Institutional Stay: Payer: 59 | Admitting: Nurse Practitioner

## 2022-12-22 DIAGNOSIS — F03918 Unspecified dementia, unspecified severity, with other behavioral disturbance: Secondary | ICD-10-CM

## 2022-12-22 DIAGNOSIS — Z515 Encounter for palliative care: Secondary | ICD-10-CM

## 2022-12-22 DIAGNOSIS — R634 Abnormal weight loss: Secondary | ICD-10-CM

## 2022-12-22 DIAGNOSIS — E46 Unspecified protein-calorie malnutrition: Secondary | ICD-10-CM | POA: Diagnosis not present

## 2022-12-23 ENCOUNTER — Telehealth: Payer: Self-pay | Admitting: Internal Medicine

## 2022-12-23 NOTE — Telephone Encounter (Signed)
Pt daughter in law called in this morning to let Dr.Scott knows that pt passed last night. She did not provide me with any other info. Any questions, she's available @ [phone].

## 2022-12-24 NOTE — Telephone Encounter (Signed)
Called and left message for Tanya Cole.

## 2022-12-30 DIAGNOSIS — 419620001 Death: Secondary | SNOMED CT | POA: Diagnosis not present

## 2022-12-30 NOTE — Progress Notes (Addendum)
[facility] Consult Note Telephone: [phone]  Fax: [phone]    Date of encounter: 02/87/2024 12:01 PM PATIENT NAME: Tanya Cole Tanya Cole [name] [address]   [phone] (home)  DOB: [birth year] MRN: [id number] PRIMARY CARE PROVIDER:    [facility]  RESPONSIBLE PARTY:    Contact Information     Name Relation Home Work [place] J Daughter [phone]   johnson,jennifer Daughter   [phone]   Ruegg,Monica Relative [phone]  [phone]   Kyden, Elwell [phone]  [phone]   [name]   [phone]     I met face to face with patient in facility. Palliative Care was asked to follow this patient by consultation request of  Tanya Pheasant, MD/[place] to address advance care planning and complex medical decision making. This is the initial visit.        ASSESSMENT AND PLAN / RECOMMENDATIONS:  Symptom Management/Plan: 1. Advance Care Planning;  DNR lengthly discussion with Tanya Tanya Cole daughter, Tanya Cole about progression to end of life, appears active, we talked about goc, and option of hospice services under medicare benefit. We talked about stopping medications as currently Tanya Tanya Cole is unresponsive, safety. Pam wishes to proceed with hospice, d/c medications. Notified facility, order received.  2. Goals of Care: Goals include to maximize quality of life and symptom management. Our advance care planning conversation included a discussion about:    The value and importance of advance care planning  Exploration of personal, cultural or spiritual beliefs that might influence medical decisions  Exploration of goals of care in the event of a sudden injury or illness  Identification and preparation of a healthcare agent  Review and updating or creation of an advance directive document. 3. Palliative care encounter;  Palliative care encounter; Palliative medicine team will continue to support patient, patient's family, and medical team. Visit consisted of counseling and education dealing with the complex and emotionally intense issues of symptom management and palliative care in the setting of serious and potentially life-threatening illness   4. Weight loss, anorexia Dementia, decompensation with recent hospitalization, continue to encourage to participate in therapy, continue redirect with behaviors, continue current medications for behaviors. Psych if needed. Will f/u with family about goc.   12/19/204 weight 139.2 lbs 11/14/2022 weight 125.4 lbs 12/21/2022 weight 109.7 lbs 29.5 lbs/2 months; 21.19%  I spent 45 minutes providing this consultation started at 11:15 am. More than 50% of the time in this consultation was spent in counseling and care coordination. PPS: 50%   Chief Complaint: Initial palliative consult for complex medical decision making, address goals, manage ongoing symptoms   HISTORY OF PRESENT ILLNESS:  Tanya Cole is a 89 y.o. year old female  with multiple medical problems including Dementia with behaviors, polymyalgia rheumatica syndrome, h/o steroid-induced psychosis with hallucinations, DM, CAD, HTN, COPD, Afib, diastolic CHF. Tanya Cole was hospitalized 10/24/2022 to 11/14/2022 for acute metabolic encephalopathy, crying, visual hallucinations, crying, confusion. Workup significant for UTI with h/o steroid-induced psychosis though not currently on steroids for PMR. Tanya Cole was stabilized d/c to [facility] where she did STR and has transitioned to LTC. Tanya Cole now is total care for transfers, mobility, bathing, dressing, feeding with very poor appetite. Over the last week Tanya Cole has significantly declined functionally, unable to get oob, cognitively sleeping a lot to the point today of not responding. Tanya Cole is a DNR. Purpose of today PC f/u visit further discussion  of goc with dtg. I  visited and observed Tanya Cole. Tanya Cole was not responsive, appears progressing to end of life, comfortable with O2 supplemental. No arousal with verbal cues. Assessment completed. I contact Pam for updated discussion of clinical condition, progression of disease, weight loss; pc visit with option of transitioning to hospice at facility with what services are provided. Pam in agreement, notified facility, provider and hospice order sent. Discussed with Pam, d/c medications due to safety, will keep ativan. Updated staff.   History obtained from review of EMR, discussion with primary team, and interview with family, facility staff/caregiver and/or Tanya. Cole.  I reviewed available labs, medications, imaging, studies and related documents from the EMR.  Records reviewed and summarized above.    Physical Exam: Constitutional: NAD General: unresponsive female, critically ill progressing to end of life CV: S1S2, RRR Pulmonary: poor air movement with rhonchi Skin: warm and dry Psych: not responsive Thank you for the opportunity to participate in the care of Tanya Cole. Please call our office at [phone] if we can be of additional assistance.   Nishaan Stanke Ihor Gully, NP

## 2022-12-30 DEATH — deceased

## 2023-12-30 IMAGING — CT CT HEAD W/O CM
4 series · 16 of 47 positions shown, 18 images · non-contrast
Comparison: None Available.

CLINICAL DATA: Headache beginning 24 hours ago. No relief with
over-the-counter medications. Hyperglycemia. Hypertension.



[Series 2: head wo · axial · 0.39mm/px · z∈[+262,+372]mm · 7 of 30 slices shown, 9 images]
[im 4/30  brain]
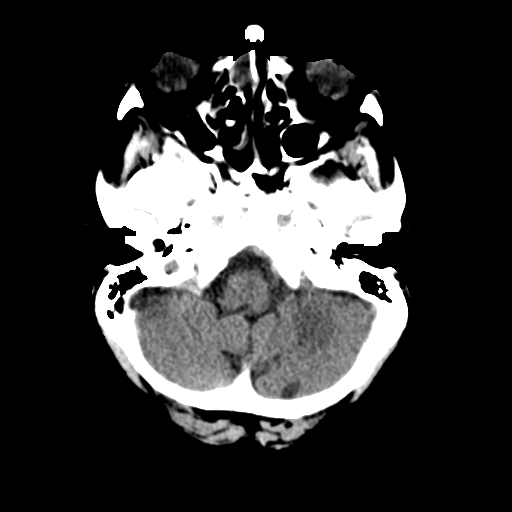
[im 4/30  bone]
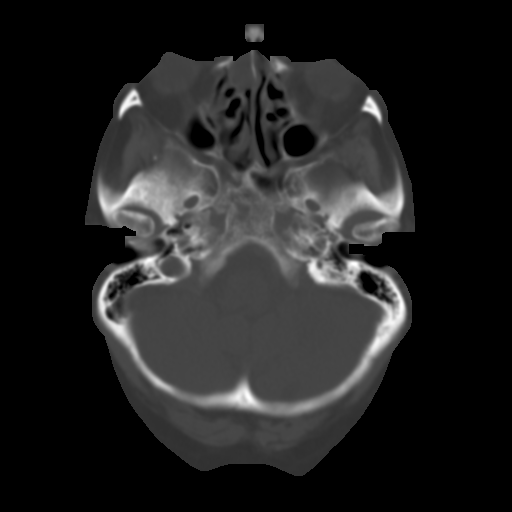
[im 8/30  brain]
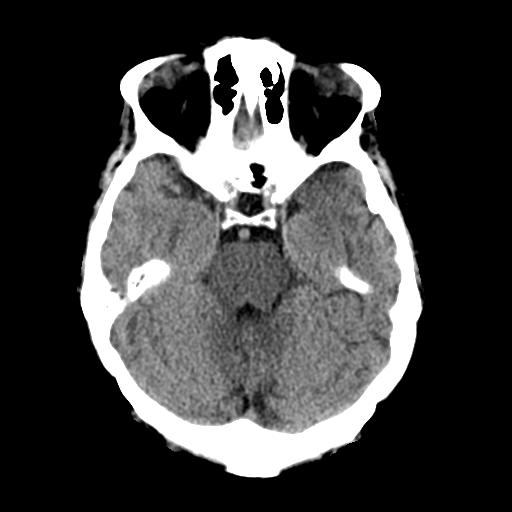
[im 11/30  brain]
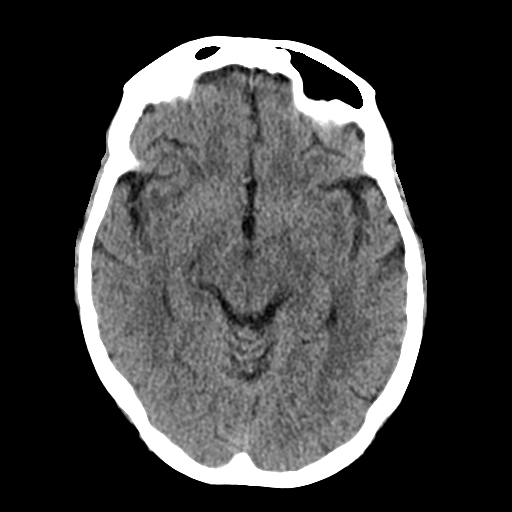
[im 15/30  brain]
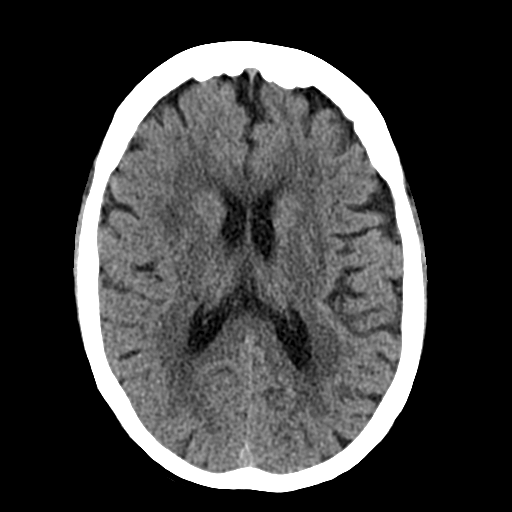
[im 19/30  brain]
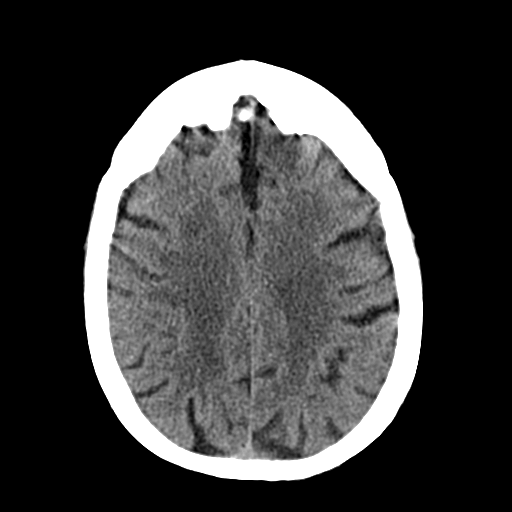
[im 19/30  bone]
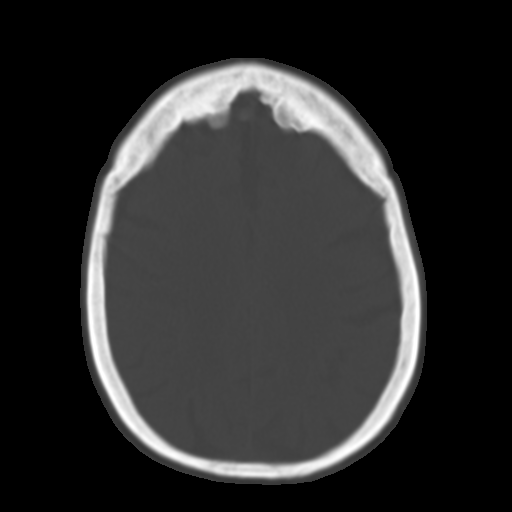
[im 22/30  brain]
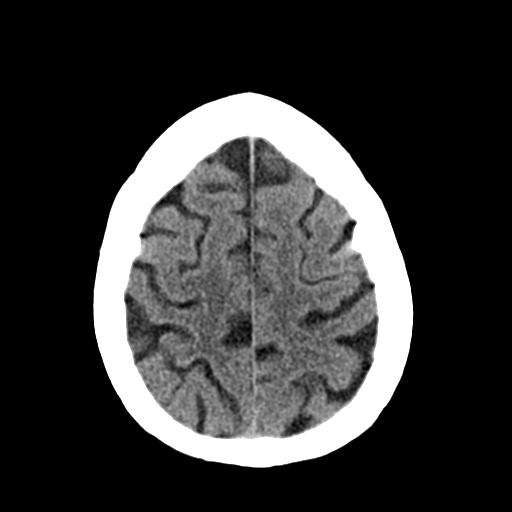
[im 26/30  brain]
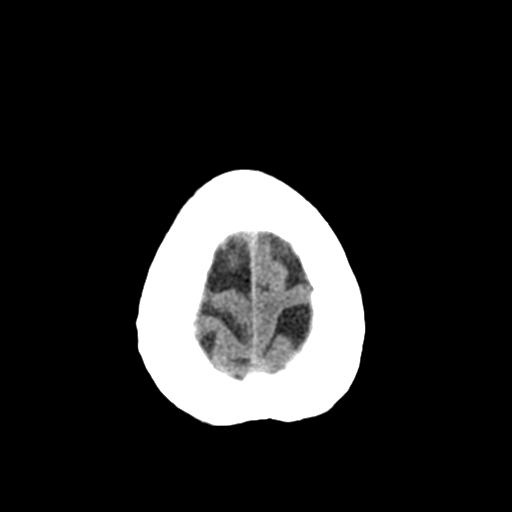

[Series 3: head bone · axial · 0.39mm/px · z∈[+261,+289]mm · 3 of 74 slices shown]
[im 8/74  bone]
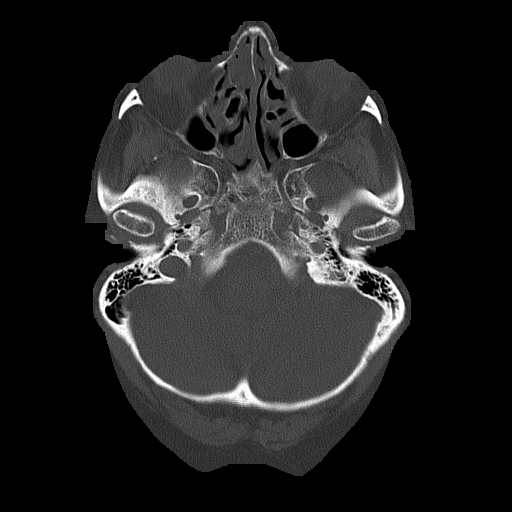
[im 15/74  bone]
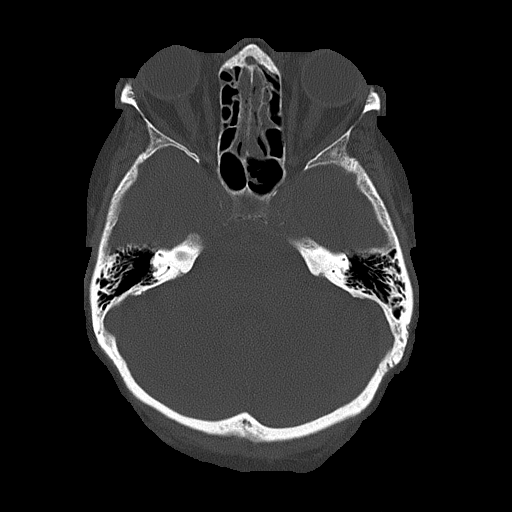
[im 22/74  bone]
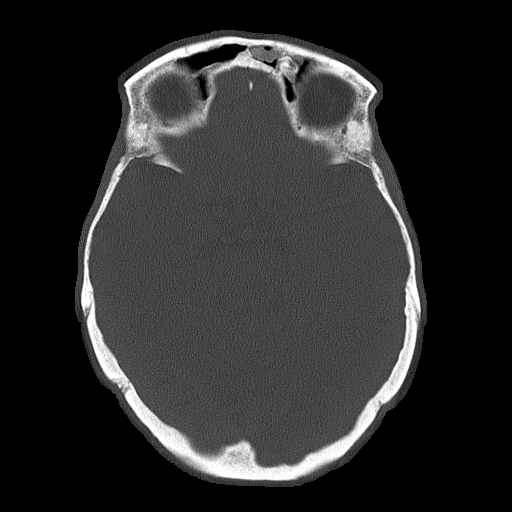

[Series 4: coronal soft tissue · coronal · 0.31mm/px · 3 of 64 slices shown]
[im 22/64  brain]
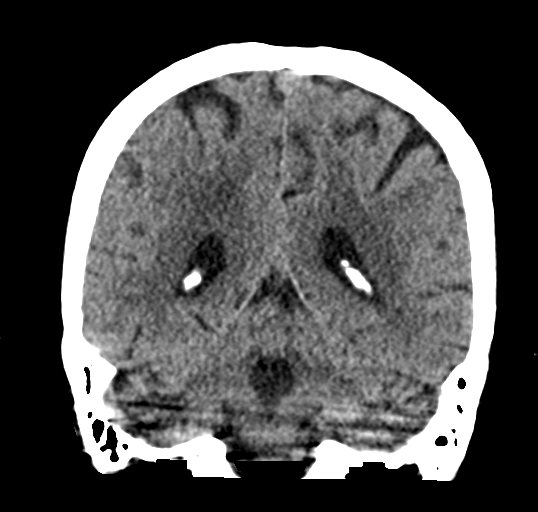
[im 29/64  brain]
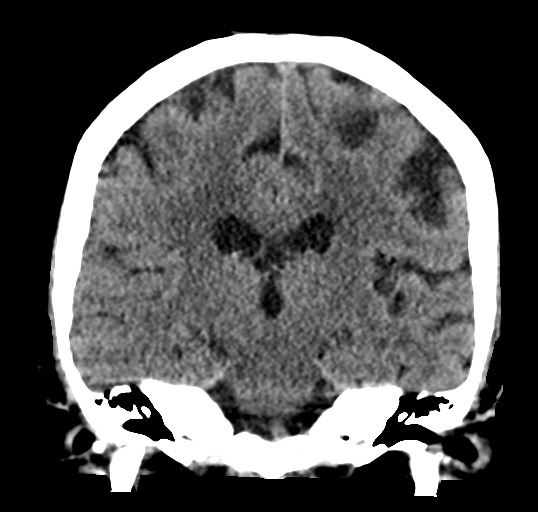
[im 36/64  brain]
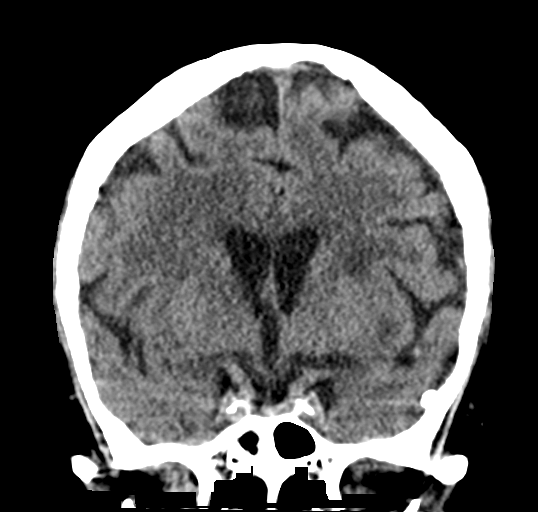

[Series 5: sagittal soft tissue · sagittal · 0.31mm/px · 3 of 54 slices shown]
[im 18/54  brain]
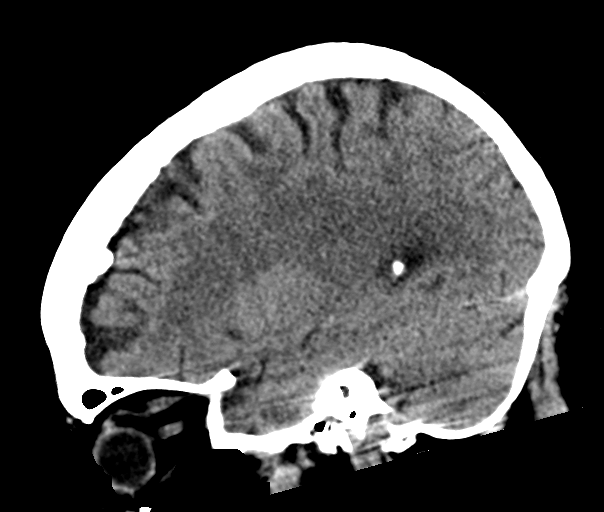
[im 27/54  brain]
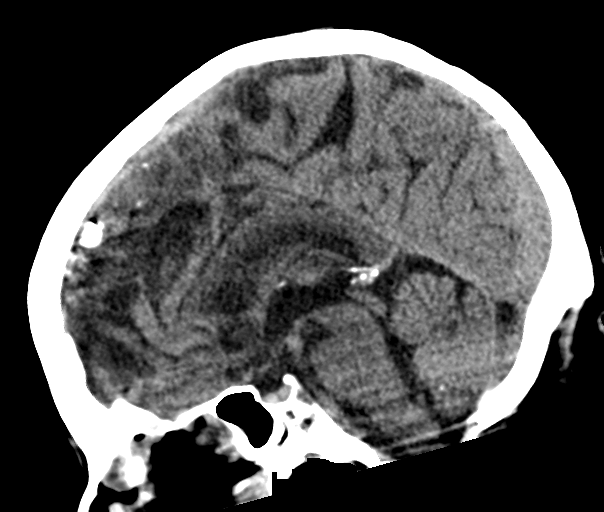
[im 36/54  brain]
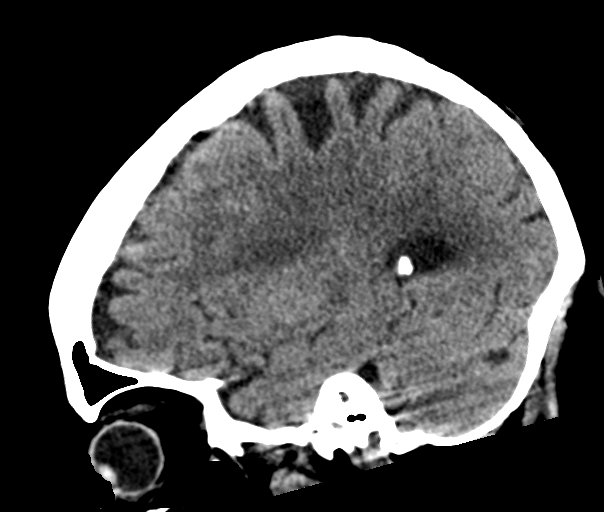

[16 of 47 positions shown; findings below may reference images not displayed]

FINDINGS: Brain: No acute infarct, hemorrhage, or mass lesion is present. Mild
generalized white matter hypoattenuation is similar the prior exam.
Remote lacunar infarcts of the cerebellum are stable. The ventricles
are of normal size. No significant extraaxial fluid collection is
present.

The brainstem and cerebellum are within normal limits.

Vascular: Atherosclerotic calcifications are present within the
cavernous internal carotid arteries bilaterally. No hyperdense
vessel is present.

Skull: Calvarium is intact. No focal lytic or blastic lesions are
present. No significant extracranial soft tissue lesion is present.

Sinuses/Orbits: Mild mucosal thickening is present in the inferior
maxillary sinuses bilaterally. Osteoma is again noted in the
inferior left frontal sinus. No active sinus disease. Mastoid air
cells are clear. Right lens replacement is noted. Globes and orbits
are otherwise within normal limits.
IMPRESSION: 1. No acute intracranial abnormality or significant interval change.
2. Stable atrophy and white matter disease.
3. Remote lacunar infarcts of the cerebellum are stable.
